# Patient Record
Sex: Female | Born: 1985
Health system: Southern US, Community
[De-identification: ages and names within clinical notes are randomized; demographics above are authoritative.]

## PROBLEM LIST (undated history)

## (undated) ENCOUNTER — Ambulatory Visit: Admission: EM | Payer: 59 | Source: Home / Self Care

## (undated) DIAGNOSIS — R0789 Other chest pain: Secondary | ICD-10-CM

## (undated) DIAGNOSIS — C801 Malignant (primary) neoplasm, unspecified: Secondary | ICD-10-CM

## (undated) DIAGNOSIS — A630 Anogenital (venereal) warts: Secondary | ICD-10-CM

## (undated) DIAGNOSIS — Z113 Encounter for screening for infections with a predominantly sexual mode of transmission: Secondary | ICD-10-CM

## (undated) DIAGNOSIS — B009 Herpesviral infection, unspecified: Secondary | ICD-10-CM

## (undated) DIAGNOSIS — D013 Carcinoma in situ of anus and anal canal: Secondary | ICD-10-CM

## (undated) DIAGNOSIS — D649 Anemia, unspecified: Secondary | ICD-10-CM

## (undated) DIAGNOSIS — Z8719 Personal history of other diseases of the digestive system: Secondary | ICD-10-CM

## (undated) DIAGNOSIS — K0530 Chronic periodontitis, unspecified: Secondary | ICD-10-CM

## (undated) DIAGNOSIS — Z8619 Personal history of other infectious and parasitic diseases: Secondary | ICD-10-CM

## (undated) DIAGNOSIS — Z8709 Personal history of other diseases of the respiratory system: Secondary | ICD-10-CM

## (undated) DIAGNOSIS — C8190 Hodgkin lymphoma, unspecified, unspecified site: Secondary | ICD-10-CM

## (undated) DIAGNOSIS — E876 Hypokalemia: Secondary | ICD-10-CM

## (undated) DIAGNOSIS — F329 Major depressive disorder, single episode, unspecified: Secondary | ICD-10-CM

## (undated) DIAGNOSIS — B20 Human immunodeficiency virus [HIV] disease: Secondary | ICD-10-CM

## (undated) DIAGNOSIS — F32A Depression, unspecified: Secondary | ICD-10-CM

## (undated) DIAGNOSIS — Z21 Asymptomatic human immunodeficiency virus [HIV] infection status: Secondary | ICD-10-CM

## (undated) HISTORY — DX: Hypokalemia: E87.6

## (undated) HISTORY — DX: Malignant (primary) neoplasm, unspecified: C80.1

## (undated) HISTORY — DX: Other chest pain: R07.89

## (undated) HISTORY — DX: Hodgkin lymphoma, unspecified, unspecified site: C81.90

## (undated) SURGERY — Surgical Case
Anesthesia: *Unknown

---
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
## 2004-07-17 ENCOUNTER — Emergency Department: Payer: Self-pay | Admitting: Internal Medicine

## 2004-07-19 ENCOUNTER — Emergency Department (HOSPITAL_COMMUNITY): Admission: EM | Admit: 2004-07-19 | Discharge: 2004-07-19 | Payer: Self-pay | Admitting: Emergency Medicine

## 2004-10-08 ENCOUNTER — Emergency Department: Payer: Self-pay | Admitting: Emergency Medicine

## 2004-10-22 ENCOUNTER — Emergency Department (HOSPITAL_COMMUNITY): Admission: EM | Admit: 2004-10-22 | Discharge: 2004-10-23 | Payer: Self-pay | Admitting: Emergency Medicine

## 2004-10-24 ENCOUNTER — Ambulatory Visit: Payer: Self-pay | Admitting: *Deleted

## 2004-12-18 ENCOUNTER — Emergency Department (HOSPITAL_COMMUNITY): Admission: EM | Admit: 2004-12-18 | Discharge: 2004-12-18 | Payer: Self-pay | Admitting: Emergency Medicine

## 2004-12-28 ENCOUNTER — Ambulatory Visit: Payer: Self-pay | Admitting: Emergency Medicine

## 2004-12-28 ENCOUNTER — Emergency Department: Payer: Self-pay | Admitting: Emergency Medicine

## 2005-02-06 ENCOUNTER — Emergency Department: Payer: Self-pay | Admitting: Emergency Medicine

## 2005-04-10 ENCOUNTER — Emergency Department: Payer: Self-pay | Admitting: Emergency Medicine

## 2005-04-19 ENCOUNTER — Emergency Department: Payer: Self-pay | Admitting: Unknown Physician Specialty

## 2005-04-20 ENCOUNTER — Emergency Department: Payer: Self-pay | Admitting: Emergency Medicine

## 2005-05-25 ENCOUNTER — Emergency Department: Payer: Self-pay | Admitting: Emergency Medicine

## 2005-06-14 ENCOUNTER — Emergency Department: Payer: Self-pay | Admitting: Emergency Medicine

## 2005-09-12 ENCOUNTER — Emergency Department: Payer: Self-pay | Admitting: Emergency Medicine

## 2005-10-28 ENCOUNTER — Emergency Department: Payer: Self-pay | Admitting: Emergency Medicine

## 2005-10-31 ENCOUNTER — Emergency Department: Payer: Self-pay | Admitting: Emergency Medicine

## 2005-10-31 ENCOUNTER — Other Ambulatory Visit: Payer: Self-pay

## 2005-11-22 ENCOUNTER — Emergency Department: Payer: Self-pay | Admitting: Emergency Medicine

## 2006-02-03 ENCOUNTER — Emergency Department (HOSPITAL_COMMUNITY): Admission: EM | Admit: 2006-02-03 | Discharge: 2006-02-04 | Payer: Self-pay | Admitting: Emergency Medicine

## 2006-03-10 ENCOUNTER — Emergency Department (HOSPITAL_COMMUNITY): Admission: EM | Admit: 2006-03-10 | Discharge: 2006-03-11 | Payer: Self-pay | Admitting: Emergency Medicine

## 2006-03-30 ENCOUNTER — Emergency Department (HOSPITAL_COMMUNITY): Admission: EM | Admit: 2006-03-30 | Discharge: 2006-03-30 | Payer: Self-pay | Admitting: Pediatrics

## 2006-04-08 ENCOUNTER — Emergency Department (HOSPITAL_COMMUNITY): Admission: EM | Admit: 2006-04-08 | Discharge: 2006-04-09 | Payer: Self-pay | Admitting: Emergency Medicine

## 2006-04-15 ENCOUNTER — Emergency Department (HOSPITAL_COMMUNITY): Admission: EM | Admit: 2006-04-15 | Discharge: 2006-04-15 | Payer: Self-pay | Admitting: Emergency Medicine

## 2006-05-06 ENCOUNTER — Emergency Department (HOSPITAL_COMMUNITY): Admission: EM | Admit: 2006-05-06 | Discharge: 2006-05-06 | Payer: Self-pay | Admitting: Family Medicine

## 2006-05-18 ENCOUNTER — Emergency Department (HOSPITAL_COMMUNITY): Admission: EM | Admit: 2006-05-18 | Discharge: 2006-05-19 | Payer: Self-pay | Admitting: Emergency Medicine

## 2006-05-19 ENCOUNTER — Encounter (INDEPENDENT_AMBULATORY_CARE_PROVIDER_SITE_OTHER): Payer: Self-pay | Admitting: *Deleted

## 2006-05-19 ENCOUNTER — Ambulatory Visit: Payer: Self-pay | Admitting: Internal Medicine

## 2006-05-19 ENCOUNTER — Encounter: Admission: RE | Admit: 2006-05-19 | Discharge: 2006-05-19 | Payer: Self-pay | Admitting: Internal Medicine

## 2006-05-19 LAB — CONVERTED CEMR LAB
Bilirubin Urine: NEGATIVE
CD4 Count: 9 microliters
CD4 T Cell Abs: 9
Chlamydia, Swab/Urine, PCR: POSITIVE — AB
GC Probe Amp, Urine: NEGATIVE
HIV 1 RNA Quant: 301000 copies/mL
HIV 1 RNA Quant: 301000 copies/mL — ABNORMAL HIGH (ref <50)
HIV-1 RNA Quant, Log: 5.48 — ABNORMAL HIGH (ref <1.70)
Hemoglobin, Urine: NEGATIVE
Ketones, ur: NEGATIVE mg/dL
Leukocytes, UA: NEGATIVE
Nitrite: NEGATIVE
Protein, ur: NEGATIVE mg/dL
Specific Gravity, Urine: 1.026 (ref 1.005–1.03)
Urine Glucose: NEGATIVE mg/dL
Urobilinogen, UA: 1 (ref 0.0–1.0)
pH: 6.5 (ref 5.0–8.0)

## 2006-05-26 ENCOUNTER — Ambulatory Visit: Payer: Self-pay | Admitting: Internal Medicine

## 2006-05-26 DIAGNOSIS — A630 Anogenital (venereal) warts: Secondary | ICD-10-CM

## 2006-05-26 DIAGNOSIS — B20 Human immunodeficiency virus [HIV] disease: Secondary | ICD-10-CM | POA: Insufficient documentation

## 2006-05-27 ENCOUNTER — Emergency Department (HOSPITAL_COMMUNITY): Admission: EM | Admit: 2006-05-27 | Discharge: 2006-05-28 | Payer: Self-pay | Admitting: Emergency Medicine

## 2006-06-03 ENCOUNTER — Emergency Department (HOSPITAL_COMMUNITY): Admission: EM | Admit: 2006-06-03 | Discharge: 2006-06-04 | Payer: Self-pay | Admitting: Emergency Medicine

## 2006-06-04 ENCOUNTER — Emergency Department (HOSPITAL_COMMUNITY): Admission: EM | Admit: 2006-06-04 | Discharge: 2006-06-04 | Payer: Self-pay | Admitting: Emergency Medicine

## 2006-06-11 ENCOUNTER — Ambulatory Visit: Payer: Self-pay | Admitting: Internal Medicine

## 2006-06-16 ENCOUNTER — Ambulatory Visit: Payer: Self-pay | Admitting: Obstetrics & Gynecology

## 2006-06-18 ENCOUNTER — Ambulatory Visit: Payer: Self-pay | Admitting: Internal Medicine

## 2006-06-21 ENCOUNTER — Encounter (INDEPENDENT_AMBULATORY_CARE_PROVIDER_SITE_OTHER): Payer: Self-pay | Admitting: *Deleted

## 2006-06-21 LAB — CONVERTED CEMR LAB
CD4 Count: 0 microliters
HIV 1 RNA Quant: 0 copies/mL

## 2006-06-24 ENCOUNTER — Encounter (INDEPENDENT_AMBULATORY_CARE_PROVIDER_SITE_OTHER): Payer: Self-pay | Admitting: *Deleted

## 2006-06-24 ENCOUNTER — Telehealth (INDEPENDENT_AMBULATORY_CARE_PROVIDER_SITE_OTHER): Payer: Self-pay | Admitting: *Deleted

## 2006-06-30 ENCOUNTER — Ambulatory Visit: Payer: Self-pay | Admitting: Obstetrics & Gynecology

## 2006-07-04 ENCOUNTER — Encounter (INDEPENDENT_AMBULATORY_CARE_PROVIDER_SITE_OTHER): Payer: Self-pay | Admitting: *Deleted

## 2006-07-05 ENCOUNTER — Telehealth (INDEPENDENT_AMBULATORY_CARE_PROVIDER_SITE_OTHER): Payer: Self-pay | Admitting: Infectious Diseases

## 2006-07-19 ENCOUNTER — Telehealth: Payer: Self-pay | Admitting: Internal Medicine

## 2006-07-26 ENCOUNTER — Emergency Department (HOSPITAL_COMMUNITY): Admission: EM | Admit: 2006-07-26 | Discharge: 2006-07-26 | Payer: Self-pay | Admitting: Emergency Medicine

## 2006-08-09 ENCOUNTER — Telehealth: Payer: Self-pay | Admitting: Internal Medicine

## 2006-08-13 ENCOUNTER — Emergency Department (HOSPITAL_COMMUNITY): Admission: EM | Admit: 2006-08-13 | Discharge: 2006-08-13 | Payer: Self-pay | Admitting: Family Medicine

## 2006-08-28 ENCOUNTER — Emergency Department (HOSPITAL_COMMUNITY): Admission: EM | Admit: 2006-08-28 | Discharge: 2006-08-29 | Payer: Self-pay | Admitting: Emergency Medicine

## 2006-09-03 ENCOUNTER — Telehealth: Payer: Self-pay | Admitting: Internal Medicine

## 2006-09-16 ENCOUNTER — Emergency Department (HOSPITAL_COMMUNITY): Admission: EM | Admit: 2006-09-16 | Discharge: 2006-09-16 | Payer: Self-pay | Admitting: Emergency Medicine

## 2006-09-17 ENCOUNTER — Inpatient Hospital Stay (HOSPITAL_COMMUNITY): Admission: EM | Admit: 2006-09-17 | Discharge: 2006-09-19 | Payer: Self-pay | Admitting: Emergency Medicine

## 2006-09-17 ENCOUNTER — Ambulatory Visit: Payer: Self-pay | Admitting: Internal Medicine

## 2006-10-06 ENCOUNTER — Emergency Department (HOSPITAL_COMMUNITY): Admission: EM | Admit: 2006-10-06 | Discharge: 2006-10-06 | Payer: Self-pay | Admitting: Family Medicine

## 2006-10-29 ENCOUNTER — Ambulatory Visit: Payer: Self-pay | Admitting: Internal Medicine

## 2006-10-29 ENCOUNTER — Inpatient Hospital Stay (HOSPITAL_COMMUNITY): Admission: EM | Admit: 2006-10-29 | Discharge: 2006-11-05 | Payer: Self-pay | Admitting: Emergency Medicine

## 2006-11-05 ENCOUNTER — Telehealth: Payer: Self-pay | Admitting: Internal Medicine

## 2006-11-08 ENCOUNTER — Telehealth: Payer: Self-pay | Admitting: Internal Medicine

## 2006-11-10 ENCOUNTER — Telehealth: Payer: Self-pay | Admitting: Internal Medicine

## 2006-11-11 ENCOUNTER — Emergency Department (HOSPITAL_COMMUNITY): Admission: EM | Admit: 2006-11-11 | Discharge: 2006-11-11 | Payer: Self-pay | Admitting: Emergency Medicine

## 2006-11-11 DIAGNOSIS — S025XXA Fracture of tooth (traumatic), initial encounter for closed fracture: Secondary | ICD-10-CM | POA: Insufficient documentation

## 2006-11-12 ENCOUNTER — Ambulatory Visit: Payer: Self-pay | Admitting: Internal Medicine

## 2006-11-12 DIAGNOSIS — K0530 Chronic periodontitis, unspecified: Secondary | ICD-10-CM

## 2006-11-12 DIAGNOSIS — B3781 Candidal esophagitis: Secondary | ICD-10-CM | POA: Insufficient documentation

## 2006-11-12 DIAGNOSIS — B009 Herpesviral infection, unspecified: Secondary | ICD-10-CM

## 2006-11-16 ENCOUNTER — Telehealth: Payer: Self-pay | Admitting: Internal Medicine

## 2006-11-16 ENCOUNTER — Encounter: Payer: Self-pay | Admitting: Internal Medicine

## 2006-11-22 ENCOUNTER — Encounter: Payer: Self-pay | Admitting: Internal Medicine

## 2006-11-25 ENCOUNTER — Emergency Department (HOSPITAL_COMMUNITY): Admission: EM | Admit: 2006-11-25 | Discharge: 2006-11-25 | Payer: Self-pay | Admitting: Emergency Medicine

## 2006-11-25 ENCOUNTER — Encounter: Payer: Self-pay | Admitting: Internal Medicine

## 2006-11-26 ENCOUNTER — Telehealth: Payer: Self-pay | Admitting: Internal Medicine

## 2006-11-29 ENCOUNTER — Telehealth: Payer: Self-pay | Admitting: Internal Medicine

## 2006-12-02 ENCOUNTER — Emergency Department (HOSPITAL_COMMUNITY): Admission: EM | Admit: 2006-12-02 | Discharge: 2006-12-02 | Payer: Self-pay | Admitting: Emergency Medicine

## 2006-12-13 ENCOUNTER — Encounter: Admission: RE | Admit: 2006-12-13 | Discharge: 2006-12-13 | Payer: Self-pay | Admitting: Internal Medicine

## 2006-12-13 ENCOUNTER — Ambulatory Visit: Payer: Self-pay | Admitting: Internal Medicine

## 2006-12-13 LAB — CONVERTED CEMR LAB
ALT: 9 units/L (ref 0–35)
AST: 16 units/L (ref 0–37)
Albumin: 3.9 g/dL (ref 3.5–5.2)
Alkaline Phosphatase: 45 units/L (ref 39–117)
BUN: 7 mg/dL (ref 6–23)
Basophils Absolute: 0 10*3/uL (ref 0.0–0.1)
Basophils Relative: 1 % (ref 0–1)
CO2: 24 meq/L (ref 19–32)
Calcium: 9.7 mg/dL (ref 8.4–10.5)
Chloride: 103 meq/L (ref 96–112)
Creatinine, Ser: 0.82 mg/dL (ref 0.40–1.20)
Eosinophils Absolute: 0.4 10*3/uL (ref 0.0–0.7)
Eosinophils Relative: 10 % — ABNORMAL HIGH (ref 0–5)
Glucose, Bld: 79 mg/dL (ref 70–99)
HCT: 33.5 % — ABNORMAL LOW (ref 36.0–46.0)
HIV 1 RNA Quant: 277 copies/mL — ABNORMAL HIGH (ref <50)
HIV-1 RNA Quant, Log: 2.44 — ABNORMAL HIGH (ref <1.70)
Hemoglobin: 10.7 g/dL — ABNORMAL LOW (ref 12.0–15.0)
Lymphocytes Relative: 19 % (ref 12–46)
Lymphs Abs: 0.8 10*3/uL (ref 0.7–3.3)
MCHC: 31.9 g/dL (ref 30.0–36.0)
MCV: 98.5 fL (ref 78.0–100.0)
Monocytes Absolute: 0.4 10*3/uL (ref 0.2–0.7)
Monocytes Relative: 9 % (ref 3–11)
Neutro Abs: 2.5 10*3/uL (ref 1.7–7.7)
Neutrophils Relative %: 61 % (ref 43–77)
Platelets: 312 10*3/uL (ref 150–400)
Potassium: 5.3 meq/L (ref 3.5–5.3)
RBC: 3.4 M/uL — ABNORMAL LOW (ref 3.87–5.11)
RDW: 26 % — ABNORMAL HIGH (ref 11.5–14.0)
Sodium: 137 meq/L (ref 135–145)
Total Bilirubin: 0.3 mg/dL (ref 0.3–1.2)
Total Protein: 8.3 g/dL (ref 6.0–8.3)
WBC: 4.2 10*3/uL (ref 4.0–10.5)

## 2006-12-14 ENCOUNTER — Telehealth: Payer: Self-pay | Admitting: Internal Medicine

## 2006-12-28 ENCOUNTER — Telehealth: Payer: Self-pay | Admitting: Internal Medicine

## 2006-12-29 ENCOUNTER — Telehealth: Payer: Self-pay | Admitting: Internal Medicine

## 2006-12-31 ENCOUNTER — Ambulatory Visit: Payer: Self-pay | Admitting: Internal Medicine

## 2006-12-31 DIAGNOSIS — R634 Abnormal weight loss: Secondary | ICD-10-CM | POA: Insufficient documentation

## 2006-12-31 DIAGNOSIS — L259 Unspecified contact dermatitis, unspecified cause: Secondary | ICD-10-CM

## 2007-01-03 ENCOUNTER — Telehealth: Payer: Self-pay | Admitting: Internal Medicine

## 2007-01-06 ENCOUNTER — Encounter: Payer: Self-pay | Admitting: Licensed Clinical Social Worker

## 2007-01-11 ENCOUNTER — Encounter: Payer: Self-pay | Admitting: Licensed Clinical Social Worker

## 2007-01-12 ENCOUNTER — Telehealth: Payer: Self-pay | Admitting: Internal Medicine

## 2007-01-18 ENCOUNTER — Telehealth: Payer: Self-pay | Admitting: Internal Medicine

## 2007-01-18 ENCOUNTER — Emergency Department (HOSPITAL_COMMUNITY): Admission: EM | Admit: 2007-01-18 | Discharge: 2007-01-18 | Payer: Self-pay | Admitting: Emergency Medicine

## 2007-02-07 ENCOUNTER — Telehealth: Payer: Self-pay | Admitting: Internal Medicine

## 2007-02-22 ENCOUNTER — Telehealth: Payer: Self-pay | Admitting: Internal Medicine

## 2007-03-01 ENCOUNTER — Telehealth: Payer: Self-pay | Admitting: Internal Medicine

## 2007-03-09 ENCOUNTER — Telehealth: Payer: Self-pay | Admitting: Internal Medicine

## 2007-03-31 ENCOUNTER — Ambulatory Visit: Payer: Self-pay | Admitting: Internal Medicine

## 2007-03-31 ENCOUNTER — Encounter: Admission: RE | Admit: 2007-03-31 | Discharge: 2007-03-31 | Payer: Self-pay | Admitting: Internal Medicine

## 2007-03-31 LAB — CONVERTED CEMR LAB
ALT: 21 units/L (ref 0–35)
AST: 19 units/L (ref 0–37)
Albumin: 4.1 g/dL (ref 3.5–5.2)
Alkaline Phosphatase: 55 units/L (ref 39–117)
BUN: 7 mg/dL (ref 6–23)
Basophils Absolute: 0 10*3/uL (ref 0.0–0.1)
Basophils Relative: 1 % (ref 0–1)
CO2: 22 meq/L (ref 19–32)
Calcium: 9.1 mg/dL (ref 8.4–10.5)
Chloride: 104 meq/L (ref 96–112)
Creatinine, Ser: 0.88 mg/dL (ref 0.40–1.20)
Eosinophils Absolute: 0.1 10*3/uL — ABNORMAL LOW (ref 0.2–0.7)
Eosinophils Relative: 4 % (ref 0–5)
Glucose, Bld: 84 mg/dL (ref 70–99)
HCT: 38.4 % (ref 36.0–46.0)
HIV 1 RNA Quant: 14900 copies/mL — ABNORMAL HIGH (ref <50)
HIV-1 RNA Quant, Log: 4.17 — ABNORMAL HIGH (ref <1.70)
Hemoglobin: 13.2 g/dL (ref 12.0–15.0)
Lymphocytes Relative: 54 % — ABNORMAL HIGH (ref 12–46)
Lymphs Abs: 1.6 10*3/uL (ref 0.7–4.0)
MCHC: 34.4 g/dL (ref 30.0–36.0)
MCV: 99.5 fL (ref 78.0–100.0)
Monocytes Absolute: 0.2 10*3/uL (ref 0.1–1.0)
Monocytes Relative: 8 % (ref 3–12)
Neutro Abs: 1 10*3/uL — ABNORMAL LOW (ref 1.7–7.7)
Neutrophils Relative %: 34 % — ABNORMAL LOW (ref 43–77)
Platelets: 267 10*3/uL (ref 150–400)
Potassium: 3.7 meq/L (ref 3.5–5.3)
RBC: 3.86 M/uL — ABNORMAL LOW (ref 3.87–5.11)
RDW: 13.4 % (ref 11.5–15.5)
Sodium: 136 meq/L (ref 135–145)
Total Bilirubin: 0.8 mg/dL (ref 0.3–1.2)
Total Protein: 8.6 g/dL — ABNORMAL HIGH (ref 6.0–8.3)
WBC: 2.9 10*3/uL — ABNORMAL LOW (ref 4.0–10.5)

## 2007-04-07 ENCOUNTER — Telehealth: Payer: Self-pay | Admitting: Internal Medicine

## 2007-04-27 ENCOUNTER — Encounter (INDEPENDENT_AMBULATORY_CARE_PROVIDER_SITE_OTHER): Payer: Self-pay | Admitting: *Deleted

## 2007-05-13 ENCOUNTER — Ambulatory Visit: Payer: Self-pay | Admitting: Internal Medicine

## 2007-05-13 DIAGNOSIS — B370 Candidal stomatitis: Secondary | ICD-10-CM

## 2007-05-13 DIAGNOSIS — N926 Irregular menstruation, unspecified: Secondary | ICD-10-CM | POA: Insufficient documentation

## 2007-05-13 LAB — CONVERTED CEMR LAB: Beta hcg, urine, semiquantitative: NEGATIVE

## 2007-05-17 ENCOUNTER — Telehealth: Payer: Self-pay | Admitting: Internal Medicine

## 2007-05-18 ENCOUNTER — Telehealth: Payer: Self-pay | Admitting: Internal Medicine

## 2007-05-23 ENCOUNTER — Telehealth: Payer: Self-pay | Admitting: Internal Medicine

## 2007-05-25 ENCOUNTER — Telehealth: Payer: Self-pay | Admitting: Internal Medicine

## 2007-05-30 ENCOUNTER — Telehealth: Payer: Self-pay | Admitting: Internal Medicine

## 2007-06-03 ENCOUNTER — Encounter (INDEPENDENT_AMBULATORY_CARE_PROVIDER_SITE_OTHER): Payer: Self-pay | Admitting: *Deleted

## 2007-06-16 ENCOUNTER — Telehealth: Payer: Self-pay | Admitting: Internal Medicine

## 2007-06-29 ENCOUNTER — Encounter (INDEPENDENT_AMBULATORY_CARE_PROVIDER_SITE_OTHER): Payer: Self-pay | Admitting: *Deleted

## 2007-07-12 ENCOUNTER — Encounter: Admission: RE | Admit: 2007-07-12 | Discharge: 2007-07-12 | Payer: Self-pay | Admitting: Internal Medicine

## 2007-07-12 ENCOUNTER — Ambulatory Visit: Payer: Self-pay | Admitting: Internal Medicine

## 2007-07-12 ENCOUNTER — Telehealth: Payer: Self-pay | Admitting: Internal Medicine

## 2007-07-12 ENCOUNTER — Telehealth (INDEPENDENT_AMBULATORY_CARE_PROVIDER_SITE_OTHER): Payer: Self-pay | Admitting: *Deleted

## 2007-07-12 LAB — CONVERTED CEMR LAB
ALT: 23 units/L (ref 0–35)
AST: 22 units/L (ref 0–37)
Albumin: 4 g/dL (ref 3.5–5.2)
Alkaline Phosphatase: 54 units/L (ref 39–117)
BUN: 7 mg/dL (ref 6–23)
Basophils Absolute: 0 10*3/uL (ref 0.0–0.1)
Basophils Relative: 0 % (ref 0–1)
CO2: 24 meq/L (ref 19–32)
Calcium: 8.8 mg/dL (ref 8.4–10.5)
Chloride: 102 meq/L (ref 96–112)
Creatinine, Ser: 0.63 mg/dL (ref 0.40–1.20)
Eosinophils Absolute: 0.1 10*3/uL (ref 0.0–0.7)
Eosinophils Relative: 3 % (ref 0–5)
Glucose, Bld: 85 mg/dL (ref 70–99)
HCT: 36.7 % (ref 36.0–46.0)
HIV 1 RNA Quant: 909000 copies/mL — ABNORMAL HIGH (ref <50)
HIV-1 RNA Quant, Log: 5.96 — ABNORMAL HIGH (ref <1.70)
Hemoglobin: 12.2 g/dL (ref 12.0–15.0)
Lymphocytes Relative: 17 % (ref 12–46)
Lymphs Abs: 0.7 10*3/uL (ref 0.7–4.0)
MCHC: 33.2 g/dL (ref 30.0–36.0)
MCV: 90.8 fL (ref 78.0–100.0)
Monocytes Absolute: 0.4 10*3/uL (ref 0.1–1.0)
Monocytes Relative: 10 % (ref 3–12)
Neutro Abs: 2.8 10*3/uL (ref 1.7–7.7)
Neutrophils Relative %: 70 % (ref 43–77)
Platelets: 265 10*3/uL (ref 150–400)
Potassium: 3.6 meq/L (ref 3.5–5.3)
RBC: 4.04 M/uL (ref 3.87–5.11)
RDW: 14.5 % (ref 11.5–15.5)
Sodium: 137 meq/L (ref 135–145)
Total Bilirubin: 0.3 mg/dL (ref 0.3–1.2)
Total Protein: 8 g/dL (ref 6.0–8.3)
WBC: 4 10*3/uL (ref 4.0–10.5)

## 2007-07-22 ENCOUNTER — Ambulatory Visit: Payer: Self-pay | Admitting: Internal Medicine

## 2007-07-22 DIAGNOSIS — R21 Rash and other nonspecific skin eruption: Secondary | ICD-10-CM | POA: Insufficient documentation

## 2007-07-22 LAB — CONVERTED CEMR LAB
HIV 1 RNA Quant: 194000 copies/mL — ABNORMAL HIGH (ref <50)
HIV-1 RNA Quant, Log: 5.29 — ABNORMAL HIGH (ref <1.70)

## 2007-07-26 ENCOUNTER — Ambulatory Visit: Payer: Self-pay | Admitting: Internal Medicine

## 2007-08-08 ENCOUNTER — Telehealth: Payer: Self-pay | Admitting: Internal Medicine

## 2007-08-10 ENCOUNTER — Emergency Department (HOSPITAL_COMMUNITY): Admission: EM | Admit: 2007-08-10 | Discharge: 2007-08-10 | Payer: Self-pay | Admitting: Emergency Medicine

## 2007-08-12 ENCOUNTER — Telehealth (INDEPENDENT_AMBULATORY_CARE_PROVIDER_SITE_OTHER): Payer: Self-pay | Admitting: *Deleted

## 2007-08-15 ENCOUNTER — Emergency Department (HOSPITAL_COMMUNITY): Admission: EM | Admit: 2007-08-15 | Discharge: 2007-08-15 | Payer: Self-pay | Admitting: Emergency Medicine

## 2007-08-17 ENCOUNTER — Ambulatory Visit: Payer: Self-pay | Admitting: Internal Medicine

## 2007-08-18 ENCOUNTER — Encounter (INDEPENDENT_AMBULATORY_CARE_PROVIDER_SITE_OTHER): Payer: Self-pay | Admitting: *Deleted

## 2007-08-19 DIAGNOSIS — T7411XA Adult physical abuse, confirmed, initial encounter: Secondary | ICD-10-CM

## 2007-08-23 ENCOUNTER — Encounter: Payer: Self-pay | Admitting: Internal Medicine

## 2007-09-05 ENCOUNTER — Emergency Department (HOSPITAL_COMMUNITY): Admission: EM | Admit: 2007-09-05 | Discharge: 2007-09-05 | Payer: Self-pay | Admitting: Family Medicine

## 2007-09-05 ENCOUNTER — Encounter (INDEPENDENT_AMBULATORY_CARE_PROVIDER_SITE_OTHER): Payer: Self-pay | Admitting: Internal Medicine

## 2007-09-05 DIAGNOSIS — R51 Headache: Secondary | ICD-10-CM

## 2007-09-05 DIAGNOSIS — R519 Headache, unspecified: Secondary | ICD-10-CM | POA: Insufficient documentation

## 2007-09-12 ENCOUNTER — Telehealth (INDEPENDENT_AMBULATORY_CARE_PROVIDER_SITE_OTHER): Payer: Self-pay | Admitting: *Deleted

## 2007-09-28 ENCOUNTER — Telehealth: Payer: Self-pay

## 2007-10-10 ENCOUNTER — Telehealth (INDEPENDENT_AMBULATORY_CARE_PROVIDER_SITE_OTHER): Payer: Self-pay | Admitting: *Deleted

## 2007-11-08 ENCOUNTER — Telehealth (INDEPENDENT_AMBULATORY_CARE_PROVIDER_SITE_OTHER): Payer: Self-pay | Admitting: *Deleted

## 2007-12-07 ENCOUNTER — Telehealth (INDEPENDENT_AMBULATORY_CARE_PROVIDER_SITE_OTHER): Payer: Self-pay | Admitting: *Deleted

## 2007-12-12 ENCOUNTER — Emergency Department (HOSPITAL_COMMUNITY): Admission: EM | Admit: 2007-12-12 | Discharge: 2007-12-12 | Payer: Self-pay | Admitting: Emergency Medicine

## 2007-12-13 ENCOUNTER — Telehealth: Payer: Self-pay | Admitting: Internal Medicine

## 2007-12-14 ENCOUNTER — Telehealth (INDEPENDENT_AMBULATORY_CARE_PROVIDER_SITE_OTHER): Payer: Self-pay | Admitting: *Deleted

## 2008-01-09 ENCOUNTER — Telehealth (INDEPENDENT_AMBULATORY_CARE_PROVIDER_SITE_OTHER): Payer: Self-pay | Admitting: *Deleted

## 2008-01-11 ENCOUNTER — Ambulatory Visit: Payer: Self-pay | Admitting: Internal Medicine

## 2008-01-11 LAB — CONVERTED CEMR LAB
ALT: 23 units/L (ref 0–35)
AST: 19 units/L (ref 0–37)
Alkaline Phosphatase: 59 units/L (ref 39–117)
Basophils Absolute: 0 10*3/uL (ref 0.0–0.1)
Basophils Relative: 1 % (ref 0–1)
Creatinine, Ser: 0.62 mg/dL (ref 0.40–1.20)
Eosinophils Relative: 4 % (ref 0–5)
HCT: 39.3 % (ref 36.0–46.0)
Hemoglobin: 12.9 g/dL (ref 12.0–15.0)
MCHC: 32.8 g/dL (ref 30.0–36.0)
Monocytes Absolute: 0.5 10*3/uL (ref 0.1–1.0)
Platelets: 265 10*3/uL (ref 150–400)
RDW: 13.7 % (ref 11.5–15.5)
Sodium: 142 meq/L (ref 135–145)
Total Bilirubin: 0.4 mg/dL (ref 0.3–1.2)

## 2008-02-03 ENCOUNTER — Telehealth (INDEPENDENT_AMBULATORY_CARE_PROVIDER_SITE_OTHER): Payer: Self-pay | Admitting: *Deleted

## 2008-03-06 ENCOUNTER — Encounter: Payer: Self-pay | Admitting: Internal Medicine

## 2008-03-08 ENCOUNTER — Telehealth (INDEPENDENT_AMBULATORY_CARE_PROVIDER_SITE_OTHER): Payer: Self-pay | Admitting: *Deleted

## 2008-03-20 ENCOUNTER — Ambulatory Visit: Payer: Self-pay | Admitting: Internal Medicine

## 2008-03-20 DIAGNOSIS — F329 Major depressive disorder, single episode, unspecified: Secondary | ICD-10-CM | POA: Insufficient documentation

## 2008-03-20 DIAGNOSIS — F32A Depression, unspecified: Secondary | ICD-10-CM | POA: Insufficient documentation

## 2008-04-03 ENCOUNTER — Telehealth (INDEPENDENT_AMBULATORY_CARE_PROVIDER_SITE_OTHER): Payer: Self-pay | Admitting: *Deleted

## 2008-04-12 ENCOUNTER — Emergency Department (HOSPITAL_COMMUNITY): Admission: EM | Admit: 2008-04-12 | Discharge: 2008-04-12 | Payer: Self-pay | Admitting: Emergency Medicine

## 2008-05-03 ENCOUNTER — Telehealth (INDEPENDENT_AMBULATORY_CARE_PROVIDER_SITE_OTHER): Payer: Self-pay | Admitting: *Deleted

## 2008-06-01 ENCOUNTER — Telehealth (INDEPENDENT_AMBULATORY_CARE_PROVIDER_SITE_OTHER): Payer: Self-pay | Admitting: *Deleted

## 2008-06-13 ENCOUNTER — Encounter: Payer: Self-pay | Admitting: Internal Medicine

## 2008-06-13 ENCOUNTER — Ambulatory Visit: Payer: Self-pay | Admitting: Internal Medicine

## 2008-06-13 DIAGNOSIS — N63 Unspecified lump in unspecified breast: Secondary | ICD-10-CM

## 2008-06-13 LAB — CONVERTED CEMR LAB
AST: 17 units/L (ref 0–37)
BUN: 13 mg/dL (ref 6–23)
Basophils Relative: 0 % (ref 0–1)
Calcium: 9.9 mg/dL (ref 8.4–10.5)
Chloride: 104 meq/L (ref 96–112)
Creatinine, Ser: 0.75 mg/dL (ref 0.40–1.20)
Eosinophils Absolute: 0.1 10*3/uL (ref 0.0–0.7)
Eosinophils Relative: 2 % (ref 0–5)
HCT: 40.2 % (ref 36.0–46.0)
HIV 1 RNA Quant: 33800 copies/mL — ABNORMAL HIGH (ref <48)
HIV-1 RNA Quant, Log: 4.53 — ABNORMAL HIGH (ref <1.68)
Hemoglobin: 14 g/dL (ref 12.0–15.0)
MCHC: 34.8 g/dL (ref 30.0–36.0)
MCV: 89.5 fL (ref 78.0–100.0)
Monocytes Absolute: 0.5 10*3/uL (ref 0.1–1.0)
Monocytes Relative: 8 % (ref 3–12)
Neutro Abs: 3 10*3/uL (ref 1.7–7.7)
RBC: 4.49 M/uL (ref 3.87–5.11)

## 2008-06-15 ENCOUNTER — Encounter: Admission: RE | Admit: 2008-06-15 | Discharge: 2008-06-15 | Payer: Self-pay | Admitting: Internal Medicine

## 2008-07-02 ENCOUNTER — Telehealth (INDEPENDENT_AMBULATORY_CARE_PROVIDER_SITE_OTHER): Payer: Self-pay | Admitting: *Deleted

## 2008-09-30 ENCOUNTER — Emergency Department (HOSPITAL_COMMUNITY): Admission: EM | Admit: 2008-09-30 | Discharge: 2008-09-30 | Payer: Self-pay | Admitting: Emergency Medicine

## 2008-10-02 ENCOUNTER — Ambulatory Visit: Payer: Self-pay | Admitting: Internal Medicine

## 2008-10-20 ENCOUNTER — Emergency Department (HOSPITAL_COMMUNITY): Admission: EM | Admit: 2008-10-20 | Discharge: 2008-10-20 | Payer: Self-pay | Admitting: Family Medicine

## 2008-11-15 ENCOUNTER — Ambulatory Visit: Payer: Self-pay | Admitting: Internal Medicine

## 2008-11-15 ENCOUNTER — Inpatient Hospital Stay (HOSPITAL_COMMUNITY): Admission: AD | Admit: 2008-11-15 | Discharge: 2008-11-15 | Payer: Self-pay | Admitting: Obstetrics & Gynecology

## 2008-11-15 LAB — CONVERTED CEMR LAB
Alkaline Phosphatase: 39 units/L (ref 39–117)
BUN: 9 mg/dL (ref 6–23)
Basophils Relative: 0 % (ref 0–1)
Eosinophils Absolute: 0.1 10*3/uL (ref 0.0–0.7)
Glucose, Bld: 86 mg/dL (ref 70–99)
HCT: 38.2 % (ref 36.0–46.0)
Hemoglobin: 12.7 g/dL (ref 12.0–15.0)
MCHC: 33.2 g/dL (ref 30.0–36.0)
MCV: 91.6 fL (ref 78.0–100.0)
Monocytes Absolute: 0.5 10*3/uL (ref 0.1–1.0)
Monocytes Relative: 14 % — ABNORMAL HIGH (ref 3–12)
RBC: 4.17 M/uL (ref 3.87–5.11)
Sodium: 136 meq/L (ref 135–145)
Total Bilirubin: 0.3 mg/dL (ref 0.3–1.2)

## 2008-11-26 ENCOUNTER — Encounter: Payer: Self-pay | Admitting: Internal Medicine

## 2008-12-05 ENCOUNTER — Ambulatory Visit: Payer: Self-pay | Admitting: Internal Medicine

## 2008-12-25 ENCOUNTER — Emergency Department (HOSPITAL_COMMUNITY): Admission: EM | Admit: 2008-12-25 | Discharge: 2008-12-25 | Payer: Self-pay | Admitting: Family Medicine

## 2009-01-08 ENCOUNTER — Telehealth (INDEPENDENT_AMBULATORY_CARE_PROVIDER_SITE_OTHER): Payer: Self-pay | Admitting: *Deleted

## 2009-01-21 ENCOUNTER — Encounter (INDEPENDENT_AMBULATORY_CARE_PROVIDER_SITE_OTHER): Payer: Self-pay | Admitting: *Deleted

## 2009-01-30 ENCOUNTER — Telehealth (INDEPENDENT_AMBULATORY_CARE_PROVIDER_SITE_OTHER): Payer: Self-pay | Admitting: *Deleted

## 2009-01-31 ENCOUNTER — Telehealth (INDEPENDENT_AMBULATORY_CARE_PROVIDER_SITE_OTHER): Payer: Self-pay | Admitting: *Deleted

## 2009-02-11 ENCOUNTER — Encounter: Payer: Self-pay | Admitting: Internal Medicine

## 2009-02-18 ENCOUNTER — Ambulatory Visit: Payer: Self-pay | Admitting: Internal Medicine

## 2009-02-18 LAB — CONVERTED CEMR LAB
ALT: 18 units/L (ref 0–35)
Albumin: 4.1 g/dL (ref 3.5–5.2)
CO2: 25 meq/L (ref 19–32)
Calcium: 9.1 mg/dL (ref 8.4–10.5)
Chloride: 106 meq/L (ref 96–112)
Creatinine, Ser: 0.72 mg/dL (ref 0.40–1.20)
Eosinophils Absolute: 0 10*3/uL (ref 0.0–0.7)
Eosinophils Relative: 1 % (ref 0–5)
HCT: 40.3 % (ref 36.0–46.0)
HIV 1 RNA Quant: 196000 copies/mL — ABNORMAL HIGH (ref <48)
Lymphocytes Relative: 19 % (ref 12–46)
Lymphs Abs: 0.7 10*3/uL (ref 0.7–4.0)
MCV: 90.8 fL (ref >=78.0)
Monocytes Relative: 11 % (ref 3–12)
Neutrophils Relative %: 69 % (ref 43–77)
Potassium: 4.5 meq/L (ref 3.5–5.3)
RBC: 4.44 M/uL (ref 3.87–5.11)
WBC: 3.8 10*3/uL — ABNORMAL LOW (ref 4.0–10.5)

## 2009-02-28 ENCOUNTER — Telehealth (INDEPENDENT_AMBULATORY_CARE_PROVIDER_SITE_OTHER): Payer: Self-pay | Admitting: *Deleted

## 2009-03-06 ENCOUNTER — Ambulatory Visit: Payer: Self-pay | Admitting: Internal Medicine

## 2009-03-18 ENCOUNTER — Encounter: Payer: Self-pay | Admitting: Internal Medicine

## 2009-03-28 ENCOUNTER — Telehealth (INDEPENDENT_AMBULATORY_CARE_PROVIDER_SITE_OTHER): Payer: Self-pay | Admitting: *Deleted

## 2009-04-01 ENCOUNTER — Telehealth (INDEPENDENT_AMBULATORY_CARE_PROVIDER_SITE_OTHER): Payer: Self-pay | Admitting: *Deleted

## 2009-04-08 ENCOUNTER — Ambulatory Visit: Payer: Self-pay | Admitting: Internal Medicine

## 2009-04-08 LAB — CONVERTED CEMR LAB

## 2009-04-09 ENCOUNTER — Telehealth (INDEPENDENT_AMBULATORY_CARE_PROVIDER_SITE_OTHER): Payer: Self-pay | Admitting: *Deleted

## 2009-04-09 ENCOUNTER — Encounter: Payer: Self-pay | Admitting: Internal Medicine

## 2009-04-09 LAB — CONVERTED CEMR LAB
ALT: 22 units/L (ref 0–35)
Alkaline Phosphatase: 52 units/L (ref 39–117)
Basophils Absolute: 0 10*3/uL (ref 0.0–0.1)
Basophils Relative: 0 % (ref 0–1)
Creatinine, Ser: 0.75 mg/dL (ref 0.40–1.20)
Eosinophils Absolute: 0.1 10*3/uL (ref 0.0–0.7)
MCHC: 32.7 g/dL (ref 30.0–36.0)
MCV: 95.8 fL (ref >=78.0)
Monocytes Relative: 11 % (ref 3–12)
Neutro Abs: 2.4 10*3/uL (ref 1.7–7.7)
Neutrophils Relative %: 53 % (ref 43–77)
Platelets: 290 10*3/uL (ref 150–400)
RBC: 4.72 M/uL (ref 3.87–5.11)
RDW: 15.4 % (ref 11.5–15.5)
Sodium: 137 meq/L (ref 135–145)
Total Bilirubin: 0.3 mg/dL (ref 0.3–1.2)
Total Protein: 8.1 g/dL (ref 6.0–8.3)

## 2009-05-01 ENCOUNTER — Telehealth (INDEPENDENT_AMBULATORY_CARE_PROVIDER_SITE_OTHER): Payer: Self-pay | Admitting: *Deleted

## 2009-05-01 ENCOUNTER — Ambulatory Visit: Payer: Self-pay | Admitting: Internal Medicine

## 2009-05-13 ENCOUNTER — Encounter (INDEPENDENT_AMBULATORY_CARE_PROVIDER_SITE_OTHER): Payer: Self-pay | Admitting: *Deleted

## 2009-05-28 ENCOUNTER — Telehealth (INDEPENDENT_AMBULATORY_CARE_PROVIDER_SITE_OTHER): Payer: Self-pay | Admitting: *Deleted

## 2009-06-07 ENCOUNTER — Encounter: Payer: Self-pay | Admitting: Internal Medicine

## 2009-07-01 ENCOUNTER — Telehealth (INDEPENDENT_AMBULATORY_CARE_PROVIDER_SITE_OTHER): Payer: Self-pay | Admitting: *Deleted

## 2009-07-30 ENCOUNTER — Encounter: Payer: Self-pay | Admitting: Internal Medicine

## 2009-07-31 ENCOUNTER — Telehealth (INDEPENDENT_AMBULATORY_CARE_PROVIDER_SITE_OTHER): Payer: Self-pay | Admitting: *Deleted

## 2009-08-05 ENCOUNTER — Encounter (INDEPENDENT_AMBULATORY_CARE_PROVIDER_SITE_OTHER): Payer: Self-pay | Admitting: *Deleted

## 2009-08-21 ENCOUNTER — Telehealth: Payer: Self-pay | Admitting: Internal Medicine

## 2009-09-16 ENCOUNTER — Ambulatory Visit: Payer: Self-pay | Admitting: Internal Medicine

## 2009-09-16 LAB — CONVERTED CEMR LAB
ALT: 36 units/L — ABNORMAL HIGH (ref 0–35)
Basophils Relative: 1 % (ref 0–1)
CO2: 26 meq/L (ref 19–32)
Calcium: 8.5 mg/dL (ref 8.4–10.5)
Chloride: 102 meq/L (ref 96–112)
Eosinophils Absolute: 0 10*3/uL (ref 0.0–0.7)
Glucose, Bld: 85 mg/dL (ref 70–99)
HIV 1 RNA Quant: 314000 copies/mL — ABNORMAL HIGH (ref <48)
Hemoglobin: 13 g/dL (ref 12.0–15.0)
Hep B S Ab: NEGATIVE
Lymphs Abs: 0.7 10*3/uL (ref 0.7–4.0)
MCHC: 34 g/dL (ref 30.0–36.0)
MCV: 87.2 fL (ref 78.0–100.0)
Monocytes Absolute: 0.2 10*3/uL (ref 0.1–1.0)
Monocytes Relative: 9 % (ref 3–12)
Neutro Abs: 1.2 10*3/uL — ABNORMAL LOW (ref 1.7–7.7)
Neutrophils Relative %: 57 % (ref 43–77)
RBC: 4.38 M/uL (ref 3.87–5.11)
Sodium: 138 meq/L (ref 135–145)
Total Bilirubin: 0.4 mg/dL (ref 0.3–1.2)
Total Protein: 8.2 g/dL (ref 6.0–8.3)
WBC: 2.2 10*3/uL — ABNORMAL LOW (ref 4.0–10.5)

## 2009-09-18 ENCOUNTER — Telehealth (INDEPENDENT_AMBULATORY_CARE_PROVIDER_SITE_OTHER): Payer: Self-pay | Admitting: *Deleted

## 2009-10-03 ENCOUNTER — Ambulatory Visit: Payer: Self-pay | Admitting: Internal Medicine

## 2009-10-18 ENCOUNTER — Emergency Department (HOSPITAL_COMMUNITY): Admission: EM | Admit: 2009-10-18 | Discharge: 2009-10-18 | Payer: Self-pay | Admitting: Family Medicine

## 2009-10-18 ENCOUNTER — Telehealth (INDEPENDENT_AMBULATORY_CARE_PROVIDER_SITE_OTHER): Payer: Self-pay | Admitting: *Deleted

## 2009-10-29 ENCOUNTER — Telehealth: Payer: Self-pay | Admitting: Internal Medicine

## 2009-11-18 ENCOUNTER — Ambulatory Visit: Payer: Self-pay | Admitting: Internal Medicine

## 2009-11-18 LAB — CONVERTED CEMR LAB
ALT: 26 units/L (ref 0–35)
AST: 24 units/L (ref 0–37)
Albumin: 4 g/dL (ref 3.5–5.2)
Alkaline Phosphatase: 58 units/L (ref 39–117)
BUN: 8 mg/dL (ref 6–23)
Basophils Absolute: 0 10*3/uL (ref 0.0–0.1)
Basophils Relative: 0 % (ref 0–1)
Chloride: 105 meq/L (ref 96–112)
Eosinophils Absolute: 0.1 10*3/uL (ref 0.0–0.7)
HIV-1 RNA Quant, Log: 4.48 — ABNORMAL HIGH (ref <1.68)
MCHC: 34.3 g/dL (ref 30.0–36.0)
MCV: 94.5 fL (ref 78.0–100.0)
Neutro Abs: 1.8 10*3/uL (ref 1.7–7.7)
Neutrophils Relative %: 53 % (ref 43–77)
Platelets: 239 10*3/uL (ref 150–400)
Potassium: 4 meq/L (ref 3.5–5.3)
RDW: 16.4 % — ABNORMAL HIGH (ref 11.5–15.5)
Sodium: 137 meq/L (ref 135–145)
Total Protein: 8 g/dL (ref 6.0–8.3)

## 2009-11-25 ENCOUNTER — Telehealth: Payer: Self-pay | Admitting: Internal Medicine

## 2009-11-26 ENCOUNTER — Telehealth: Payer: Self-pay | Admitting: Internal Medicine

## 2009-12-24 ENCOUNTER — Telehealth: Payer: Self-pay | Admitting: Internal Medicine

## 2010-01-20 ENCOUNTER — Telehealth: Payer: Self-pay | Admitting: Internal Medicine

## 2010-02-03 ENCOUNTER — Encounter: Payer: Self-pay | Admitting: Internal Medicine

## 2010-02-04 ENCOUNTER — Ambulatory Visit: Payer: Self-pay | Admitting: Internal Medicine

## 2010-02-04 LAB — CONVERTED CEMR LAB
HIV 1 RNA Quant: 62800 copies/mL — ABNORMAL HIGH (ref <20)
HIV-1 RNA Quant, Log: 4.8 — ABNORMAL HIGH (ref <1.30)

## 2010-02-05 ENCOUNTER — Encounter: Payer: Self-pay | Admitting: Internal Medicine

## 2010-02-07 LAB — CONVERTED CEMR LAB
Basophils Absolute: 0 10*3/uL (ref 0.0–0.1)
CO2: 24 meq/L (ref 19–32)
Creatinine, Ser: 0.71 mg/dL (ref 0.40–1.20)
Eosinophils Absolute: 0.1 10*3/uL (ref 0.0–0.7)
Eosinophils Relative: 1 % (ref 0–5)
Glucose, Bld: 68 mg/dL — ABNORMAL LOW (ref 70–99)
HCT: 36.8 % (ref 36.0–46.0)
Hemoglobin: 12.7 g/dL (ref 12.0–15.0)
Lymphocytes Relative: 24 % (ref 12–46)
Lymphs Abs: 0.9 10*3/uL (ref 0.7–4.0)
MCV: 91.8 fL (ref 78.0–100.0)
Monocytes Absolute: 0.3 10*3/uL (ref 0.1–1.0)
RDW: 13.1 % (ref 11.5–15.5)
Total Bilirubin: 0.2 mg/dL — ABNORMAL LOW (ref 0.3–1.2)

## 2010-03-07 ENCOUNTER — Encounter: Payer: Self-pay | Admitting: Internal Medicine

## 2010-03-14 ENCOUNTER — Ambulatory Visit: Payer: Self-pay | Admitting: Internal Medicine

## 2010-03-18 ENCOUNTER — Encounter (INDEPENDENT_AMBULATORY_CARE_PROVIDER_SITE_OTHER): Payer: Self-pay | Admitting: *Deleted

## 2010-03-19 ENCOUNTER — Telehealth (INDEPENDENT_AMBULATORY_CARE_PROVIDER_SITE_OTHER): Payer: Self-pay | Admitting: *Deleted

## 2010-04-05 ENCOUNTER — Emergency Department (HOSPITAL_COMMUNITY)
Admission: EM | Admit: 2010-04-05 | Discharge: 2010-04-06 | Payer: Self-pay | Source: Home / Self Care | Admitting: Emergency Medicine

## 2010-04-14 ENCOUNTER — Telehealth: Payer: Self-pay | Admitting: *Deleted

## 2010-04-29 ENCOUNTER — Encounter: Payer: Self-pay | Admitting: Internal Medicine

## 2010-04-29 ENCOUNTER — Ambulatory Visit
Admission: RE | Admit: 2010-04-29 | Discharge: 2010-04-29 | Payer: Self-pay | Source: Home / Self Care | Attending: Internal Medicine | Admitting: Internal Medicine

## 2010-04-29 LAB — CONVERTED CEMR LAB
ALT: 12 units/L (ref 0–35)
AST: 14 units/L (ref 0–37)
Albumin: 4.3 g/dL (ref 3.5–5.2)
Basophils Absolute: 0 10*3/uL (ref 0.0–0.1)
Calcium: 9.4 mg/dL (ref 8.4–10.5)
Chloride: 102 meq/L (ref 96–112)
Cholesterol: 149 mg/dL (ref 0–200)
Eosinophils Relative: 2 % (ref 0–5)
HDL: 37 mg/dL — ABNORMAL LOW (ref >39)
Lymphocytes Relative: 40 % (ref 12–46)
Neutro Abs: 2.2 10*3/uL (ref 1.7–7.7)
Platelets: 283 10*3/uL (ref 150–400)
Potassium: 5 meq/L (ref 3.5–5.3)
RDW: 14 % (ref 11.5–15.5)
Total CHOL/HDL Ratio: 4
Total Protein: 8.1 g/dL (ref 6.0–8.3)

## 2010-05-14 ENCOUNTER — Ambulatory Visit
Admission: RE | Admit: 2010-05-14 | Discharge: 2010-05-14 | Payer: Self-pay | Source: Home / Self Care | Attending: Internal Medicine | Admitting: Internal Medicine

## 2010-05-16 ENCOUNTER — Encounter (INDEPENDENT_AMBULATORY_CARE_PROVIDER_SITE_OTHER): Payer: Self-pay | Admitting: *Deleted

## 2010-05-18 ENCOUNTER — Encounter: Payer: Self-pay | Admitting: Internal Medicine

## 2010-05-27 NOTE — Progress Notes (Signed)
Summary: ADAP rx ready for pick-up  Phone Note Outgoing Call   Call placed by: Jennet Maduro RN,  March 19, 2010 10:52 AM Call placed to: Patient Action Taken: Assistance medications ready for pick up Summary of Call: Spoke to pt.  Reminded of the clinic closure for the holiday. Jennet Maduro RN  March 19, 2010 10:54 AM     Prescriptions: AZITHROMYCIN 600 MG TABS (AZITHROMYCIN) take 2 tablets once a week  #8 x 0   Entered by:   Jennet Maduro RN   Authorized by:   Yisroel Ramming MD   Signed by:   Jennet Maduro RN on 03/19/2010   Method used:   Samples Given   RxID:   [id number]

## 2010-05-27 NOTE — Progress Notes (Signed)
Summary: NCADAP/pt assist meds arrived for May  Phone Note Refill Request      Prescriptions: ISENTRESS 400 MG TABS (RALTEGRAVIR POTASSIUM) Take 1 tablet by mouth two times a day  #60 x 0   Entered by:   Paulo Fruit  BS,CPht II,MPH   Authorized by:   Yisroel Ramming MD   Signed by:   Paulo Fruit  BS,CPht II,MPH on 09/18/2009   Method used:   Samples Given   RxID:   [id number] TRUVADA 200-300 MG  TABS (EMTRICITABINE-TENOFOVIR) Take 1 tablet by mouth once a day  #30 x 0   Entered by:   Paulo Fruit  BS,CPht II,MPH   Authorized by:   Yisroel Ramming MD   Signed by:   Paulo Fruit  BS,CPht II,MPH on 09/18/2009   Method used:   Samples Given   RxID:   [id number] BACTRIM DS 800-160 MG TABS (SULFAMETHOXAZOLE-TRIMETHOPRIM) .qdtab  #30 x 0   Entered by:   Paulo Fruit  BS,CPht II,MPH   Authorized by:   Yisroel Ramming MD   Signed by:   Paulo Fruit  BS,CPht II,MPH on 09/18/2009   Method used:   Samples Given   RxID:   [id number]   Patient Assist Medication Verification: Medication: Isentress 400mg  Lot# [id number] Exp Date:05 2013 Tech approval:MLD                Patient Assist Medication Verification: Medication:Truvada Lot# [id number] Exp Date:11 2014 Tech approval:MLD               Patient Assist Medication Verification: Medication name:SMZ-TMP DS 800/160mg  RX # [id number] Tech approval:MLD  Patient picked up April's medication during her lab visit earlier this week. Paulo Fruit  BS,CPht II,MPH  Sep 18, 2009 9:13 AM

## 2010-05-27 NOTE — Miscellaneous (Signed)
Summary: med list  Clinical Lists Changes  Medications: Added new medication of POTASSIUM CHLORIDE 20 MEQ PACK (POTASSIUM CHLORIDE) take one tablet one time a day

## 2010-05-27 NOTE — Letter (Signed)
Summary: [place] [facility]  [facility]   Imported By: Florinda Marker 07/18/2009 16:13:44  _____________________________________________________________________  External Attachment:    Type:   Image     Comment:   External Document

## 2010-05-27 NOTE — Miscellaneous (Signed)
Summary: Appointment No Show  Appointment status changed to no show by [employer] on 07/30/2009 4:53 PM.  No Show Comments ---------------- lab[mkj]  Appointment Information ----------------------- Appt Type:  LAB NO DOCUMENT      Date:  Tuesday, July 30, 2009      Time:  9:30 AM for 30 min   Urgency:  Routine   Made By:  Pearson Grippe  To Visit:  [id number]    Reason:  lab[mkj]  Appt Comments ------------- -- 07/30/09 16:53: ([facility]) NO SHOW -- lab[mkj] -- 07/29/09 13:08: ([facility]) BOOKED -- Routine LAB NO DOCUMENT at 07/30/2009 9:30 AM for 30 min lab[mkj] -- 07/29/09 13:07: ([facility]) BOOKED -- Routine LAB NO DOCUMENT at 07/30/2009 9:30 AM for 30 min lab[mkj] --

## 2010-05-27 NOTE — Progress Notes (Signed)
Summary: NCADAP/pt assist med arrived for Jul  Phone Note Refill Request      Prescriptions: LOTRISONE 1-0.05 % CREA (CLOTRIMAZOLE-BETAMETHASONE) apply to affect area two times a day  #3 tubes x 0   Entered by:   Paulo Fruit  BS,CPht II,MPH   Authorized by:   Yisroel Ramming MD   Signed by:   Paulo Fruit  BS,CPht II,MPH on 10/29/2009   Method used:   Samples Given   RxID:   [id number] ISENTRESS 400 MG TABS (RALTEGRAVIR POTASSIUM) Take 1 tablet by mouth two times a day  #60 x 0   Entered by:   Paulo Fruit  BS,CPht II,MPH   Authorized by:   Yisroel Ramming MD   Signed by:   Paulo Fruit  BS,CPht II,MPH on 10/29/2009   Method used:   Samples Given   RxID:   [id number] TRUVADA 200-300 MG  TABS (EMTRICITABINE-TENOFOVIR) Take 1 tablet by mouth once a day  #30 x 0   Entered by:   Paulo Fruit  BS,CPht II,MPH   Authorized by:   Yisroel Ramming MD   Signed by:   Paulo Fruit  BS,CPht II,MPH on 10/29/2009   Method used:   Samples Given   RxID:   [id number] BACTRIM DS 800-160 MG TABS (SULFAMETHOXAZOLE-TRIMETHOPRIM) .qdtab  #30 x 0   Entered by:   Paulo Fruit  BS,CPht II,MPH   Authorized by:   Yisroel Ramming MD   Signed by:   Paulo Fruit  BS,CPht II,MPH on 10/29/2009   Method used:   Samples Given   RxID:   [id number]  Patient Assist Medication Verification: Medication name: Isentress 400mg  RX # [id number] Tech approval:MLD  Patient Assist Medication Verification: Medication name:Truvda RX #  [id number] Tech approval:MLD  Patient Assist Medication Verification: Medication name:Sulfameth/Trimethoprim 800/160mg  RX # [id number] Tech approval:MLD   Patient Assist Medication Verification: Medication:Lotrisone cream RX: [id number] Lot# [id number] Exp Date:Sep 12 Tech approval:MLD Tried to contact patient.  Her phone number at the time call was placed to her does not accept incoming calls. Paulo Fruit  BS,CPht II,MPH  October 29, 2009 10:05 AM

## 2010-05-27 NOTE — Assessment & Plan Note (Signed)
Summary: F/U OV/VS   CC:  follow-up visit, lab results, and needs RX for megace.  History of Present Illness: Pt stopped taking her HIV meds because she has been depressed and she does not see the point. She agreed to start an antidepressant and see our MH counselor. She is not suicidal.  Preventive Screening-Counseling & Management  Alcohol-Tobacco     Alcohol drinks/day: 1     Smoking Status: current     Smoking Cessation Counseling: yes     Packs/Day: 0.5     Passive Smoke Exposure: yes  Caffeine-Diet-Exercise     Caffeine use/day: 2     Does Patient Exercise: no  Hep-HIV-STD-Contraception     HIV Risk: no  Safety-Violence-Falls     Seat Belt Use: 100      Sexual History:  currently monogamous.        Drug Use:  never.        Blood Transfusions:  no.        Travel History:  none.    Comments: pt. given condoms   Updated Prior Medication List: BACTRIM DS 800-160 MG TABS (SULFAMETHOXAZOLE-TRIMETHOPRIM) .qdtab TRUVADA 200-300 MG  TABS (EMTRICITABINE-TENOFOVIR) Take 1 tablet by mouth once a day ALDARA 5 %  CREA (IMIQUIMOD) Apply at bedtime 3 times per week for 16 weeks. Leave on 6-10 hours then wash off. ISENTRESS 400 MG TABS (RALTEGRAVIR POTASSIUM) Take 1 tablet by mouth two times a day MEGACE ES 625 MG/5ML SUSP (MEGESTROL ACETATE) 5ml by mough once a day LOTRISONE 1-0.05 % CREA (CLOTRIMAZOLE-BETAMETHASONE) apply to affect area two times a day ZOLOFT 50 MG TABS (SERTRALINE HCL) Take 1 tablet by mouth once a day  Current Allergies (reviewed today): No known allergies  Review of Systems       The patient complains of anorexia and weight loss.  The patient denies fever, abdominal pain, and severe indigestion/heartburn.    Vital Signs:  Patient profile:   25 year old female Menstrual status:  regular Height:      63 inches (160.02 cm) Weight:      91.8 pounds (41.73 kg) BMI:     16.32 Temp:     98.6 degrees F (37.00 degrees C) oral Pulse rate:   82 /  minute BP sitting:   117 / 77  (right arm)  Vitals Entered By: Wendall Mola CMA Duncan Dull) (October 03, 2009 10:23 AM) CC: follow-up visit, lab results, needs RX for megace Is Patient Diabetic? No Pain Assessment Patient in pain? no      Nutritional Status BMI of < 19 = underweight Nutritional Status Detail appetite "not good"  Does patient need assistance? Functional Status Self care Ambulation Normal Comments pt. has not been taking HAART meds regularly   Physical Exam  General:  alert, well-hydrated, and underweight appearing.   Head:  normocephalic and atraumatic.   Mouth:  pharynx pink and moist.  no thrush  Lungs:  normal breath sounds.     Impression & Recommendations:  Problem # 1:  HIV DISEASE (ICD-042) Encourage pt to take her meds.  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 Take 1 tablet by mouth once a day  Medications Added to Medication List This Visit: 1)  Zoloft 50 Mg Tabs (Sertraline hcl) .... Take 1 tablet by mouth once a day  Patient Instructions: 1)  Please schedule a follow-up appointment in 4-6 weeks. 2)  Please schedule with Alisa (MH) Prescriptions: ZOLOFT 50 MG TABS (SERTRALINE HCL) Take 1 tablet by mouth once a day  #30 x 5   Entered and Authorized by:   Yisroel Ramming MD   Signed by:   Yisroel Ramming MD on 10/03/2009   Method used:   Print then Give to Patient   RxID:   [id number]

## 2010-05-27 NOTE — Assessment & Plan Note (Signed)
Summary: 6WK F/U/VS   CC:  follow-up visit on meds, pt. never received Bactrim, and Megace or Aldara cream.  History of Present Illness: Pt feels better.  she has been taking the zoloft which she thinks is helping her depression.  She re-started her HIV meds and has been taking them every day.  She is out of her megace and aldara cream.  Depression History:      The patient denies a depressed mood most of the day and a diminished interest in her usual daily activities.        The patient denies that she feels like life is not worth living, denies that she wishes that she were dead, and denies that she has thought about ending her life.        Preventive Screening-Counseling & Management  Alcohol-Tobacco     Alcohol drinks/day: 1     Smoking Status: current     Smoking Cessation Counseling: yes     Packs/Day: 0.5     Passive Smoke Exposure: yes  Caffeine-Diet-Exercise     Caffeine use/day: 2     Does Patient Exercise: no  Hep-HIV-STD-Contraception     HIV Risk: no  Safety-Violence-Falls     Seat Belt Use: 100      Sexual History:  currently monogamous.        Drug Use:  never.        Blood Transfusions:  no.        Travel History:  none.    Comments: pt. declined condoms   Updated Prior Medication List: BACTRIM DS 800-160 MG TABS (SULFAMETHOXAZOLE-TRIMETHOPRIM) .qdtab TRUVADA 200-300 MG  TABS (EMTRICITABINE-TENOFOVIR) Take 1 tablet by mouth once a day ISENTRESS 400 MG TABS (RALTEGRAVIR POTASSIUM) Take 1 tablet by mouth two times a day MEGACE ES 625 MG/5ML SUSP (MEGESTROL ACETATE) 5ml by mough once a day LOTRISONE 1-0.05 % CREA (CLOTRIMAZOLE-BETAMETHASONE) apply to affect area two times a day ZOLOFT 50 MG TABS (SERTRALINE HCL) Take 1 tablet by mouth once a day  Current Allergies (reviewed today): No known allergies  Past History:  Past Medical History: Last updated: 05/26/2006 HIV disease  Review of Systems  The patient denies anorexia, fever, and weight  loss.    Vital Signs:  Patient profile:   25 year old female Menstrual status:  regular Height:      63 inches (160.02 cm) Weight:      96.0 pounds (43.64 kg) BMI:     17.07 Temp:     98.5 degrees F (36.94 degrees C) oral Pulse rate:   73 / minute BP sitting:   113 / 72  (right arm)  Vitals Entered By: Wendall Mola CMA Duncan Dull) (November 18, 2009 10:12 AM) CC: follow-up visit on meds, pt. never received Bactrim, Megace or Aldara cream Is Patient Diabetic? No Pain Assessment Patient in pain? no      Nutritional Status BMI of < 19 = underweight Nutritional Status Detail appetite "better"  Does patient need assistance? Functional Status Self care Ambulation Normal Comments pt. missed one dose of HAART meds because she has run out Has been feeling better since starting meds requesting ensure   Physical Exam  General:  alert, well-hydrated, and underweight appearing.   Head:  normocephalic and atraumatic.   Mouth:  pharynx pink and moist.  no thrush  Lungs:  normal breath sounds.     Impression & Recommendations:  Problem # 1:  HIV DISEASE (ICD-042) Will obtain labs today and have patient f/u  in 2 weeks for results. Will refer to Byrd Hesselbach to get her enrolled in PAP program for the megace. Orders: Est. Patient Level III (09811) T-CBC w/Diff ([phone]) T-Comprehensive Metabolic Panel ([phone]) T-CD4SP ([facility]) (CD4SP) T-HIV1 Quant rflx Ultra or Genotype (96295-28413)  Problem # 2:  DEPRESSION (ICD-311) Assessment: Improved Pt taking 1/2 of the zoloft once daily. She did not f/u with MH counseling with Alisa. Her updated medication list for this problem includes:    Zoloft 50 Mg Tabs (Sertraline hcl) .Marland Kitchen... Take 1 tablet by mouth once a day  Patient Instructions: 1)  Please schedule a follow-up appointment in 2 weeks.

## 2010-05-27 NOTE — Miscellaneous (Signed)
Summary: [name]  [name]   Imported By: Florinda Marker 03/10/2010 14:48:46  _____________________________________________________________________  External Attachment:    Type:   Image     Comment:   External Document

## 2010-05-27 NOTE — Progress Notes (Signed)
Summary: Pt assist med arrived 1 month supply nxt refill is 8/22  Phone Note Refill Request      Prescriptions: MEGACE ES 625 MG/5ML SUSP (MEGESTROL ACETATE) 5ml by mough once a day  #150 x 0   Entered by:   Paulo Fruit  BS,CPht II,MPH   Authorized by:   Yisroel Ramming MD   Signed by:   Paulo Fruit  BS,CPht II,MPH on 11/26/2009   Method used:   Samples Given   RxID:   [id number]   Patient Assist Medication Verification: Medication: Megace ES 625mg /10ml Lot# [id number] Exp Date:Feb 2014 Tech approval:MLD Call placed to patient with message that assistance medications are ready for pick-up. Patient will pick up tomorow 11/27/09 morning along with her other medications.  Next reorder is 8/22 Paulo Fruit  BS,CPht II,MPH  November 26, 2009 3:40 PM                  Appended Document: Pt assist med arrived 1 month supply nxt refill is 8/22 Prescription/Samples picked up by: patient  all medication

## 2010-05-27 NOTE — Miscellaneous (Signed)
  Clinical Lists Changes  Observations: Added new observation of YEARAIDSPOS: 2006  (03/18/2010 14:49)

## 2010-05-27 NOTE — Progress Notes (Signed)
Summary: [facility]ADAP/pt assist meds arrived for Aug  Phone Note Refill Request      Prescriptions: ISENTRESS 400 MG TABS (RALTEGRAVIR POTASSIUM) Take 1 tablet by mouth two times a day  #60 x 0   Entered by:   Paulo Fruit  BS,CPht II,MPH   Authorized by:   Yisroel Ramming MD   Signed by:   Paulo Fruit  BS,CPht II,MPH on 11/25/2009   Method used:   Samples Given   RxID:   [id number] TRUVADA 200-300 MG  TABS (EMTRICITABINE-TENOFOVIR) Take 1 tablet by mouth once a day  #30 x 0   Entered by:   Paulo Fruit  BS,CPht II,MPH   Authorized by:   Yisroel Ramming MD   Signed by:   Paulo Fruit  BS,CPht II,MPH on 11/25/2009   Method used:   Samples Given   RxID:   [id number] BACTRIM DS 800-160 MG TABS (SULFAMETHOXAZOLE-TRIMETHOPRIM) .qdtab  #30 x 0   Entered by:   Paulo Fruit  BS,CPht II,MPH   Authorized by:   Yisroel Ramming MD   Signed by:   Paulo Fruit  BS,CPht II,MPH on 11/25/2009   Method used:   Samples Given   RxID:   [id number]  Patient Assist Medication Verification: Medication name: Truvada  RX # [id number] Tech approval:MLD  Patient Assist Medication Verification: Medication name:sulfameth/trimethoprim 800/160mg  RX # [id number] Tech approval:MLD  Patient Assist Medication Verification: Medication name:Isentress 400mg  RX # [id number] Tech approval:MLD  Patient just picked up medications last week of July 25th. Paulo Fruit  BS,CPht II,MPH  November 25, 2009 11:59 AM

## 2010-05-27 NOTE — Progress Notes (Signed)
Summary: NCADAP/pt assist meds arrived for Jan  Phone Note Refill Request      Prescriptions: ISENTRESS 400 MG TABS (RALTEGRAVIR POTASSIUM) Take 1 tablet by mouth two times a day  #60 x 0   Entered by:   Paulo Fruit  BS,CPht II,MPH   Authorized by:   Yisroel Ramming MD   Signed by:   Paulo Fruit  BS,CPht II,MPH on 05/01/2009   Method used:   Samples Given   RxID:   [id number] TRUVADA 200-300 MG  TABS (EMTRICITABINE-TENOFOVIR) Take 1 tablet by mouth once a day  #30 x 0   Entered by:   Paulo Fruit  BS,CPht II,MPH   Authorized by:   Yisroel Ramming MD   Signed by:   Paulo Fruit  BS,CPht II,MPH on 05/01/2009   Method used:   Samples Given   RxID:   [id number] BACTRIM DS 800-160 MG TABS (SULFAMETHOXAZOLE-TRIMETHOPRIM) .qdtab  #30 x 0   Entered by:   Paulo Fruit  BS,CPht II,MPH   Authorized by:   Yisroel Ramming MD   Signed by:   Paulo Fruit  BS,CPht II,MPH on 05/01/2009   Method used:   Samples Given   RxID:   [id number]   Patient Assist Medication Verification: Medication: Truvada Lot# [id number] Exp Date:05 2014 Tech approval:MLD               Patient Assist Medication Verification: Medication name:SMZ-TMP DS 800/160mg  RX # [id number] Tech approval:MLD   Patient Assist Medication Verification: Medication:Isentress 400mg  VQQ#[id number] Exp Date:11 2012 Tech approval:MLD Call placed to patient with message that assistance medications are ready for pick-up. Left message Paulo Fruit  BS,CPht II,MPH  May 01, 2009 3:17 PM

## 2010-05-27 NOTE — Progress Notes (Signed)
Summary: NCADAP/pt assist meds arrived for Apr  Phone Note Refill Request      Prescriptions: ISENTRESS 400 MG TABS (RALTEGRAVIR POTASSIUM) Take 1 tablet by mouth two times a day  #60 x 0   Entered by:   Paulo Fruit  BS,CPht II,MPH   Authorized by:   Yisroel Ramming MD   Signed by:   Paulo Fruit  BS,CPht II,MPH on 07/31/2009   Method used:   Samples Given   RxID:   [id number] TRUVADA 200-300 MG  TABS (EMTRICITABINE-TENOFOVIR) Take 1 tablet by mouth once a day  #30 x 0   Entered by:   Paulo Fruit  BS,CPht II,MPH   Authorized by:   Yisroel Ramming MD   Signed by:   Paulo Fruit  BS,CPht II,MPH on 07/31/2009   Method used:   Samples Given   RxID:   [id number] BACTRIM DS 800-160 MG TABS (SULFAMETHOXAZOLE-TRIMETHOPRIM) .qdtab  #30 x 0   Entered by:   Paulo Fruit  BS,CPht II,MPH   Authorized by:   Yisroel Ramming MD   Signed by:   Paulo Fruit  BS,CPht II,MPH on 07/31/2009   Method used:   Samples Given   RxID:   [id number]  Patient Assist Medication Verification: Medication name: SMZ-TMP DS 800/160mg  RX # [id number] Tech approval:MLD   Patient Assist Medication Verification: Medication:Isentress 400mg  Lot# [id number] Exp Date:08 2012 Tech approval:MLD                Patient Assist Medication Verification: Medication:Truvada Lot# [id number] Exp Date:09 2014 Tech approval:MLD               Call placed to patient with message that assistance medications are ready for pick-up. Also told patient she needs to reschedule her missed appt when she comes to pickup her medications Paulo Fruit  BS,CPht II,MPH  July 31, 2009 10:50 AM

## 2010-05-27 NOTE — Miscellaneous (Signed)
Summary: Problem list update  Clinical Lists Changes  Problems: Added new problem of SCREENING FOR MALIGNANT NEOPLASM OF THE CERVIX (ICD-V76.2) 

## 2010-05-27 NOTE — Progress Notes (Signed)
Summary: NCADAP/pt assist meds arrived for Apr  Phone Note Refill Request      Prescriptions: ISENTRESS 400 MG TABS (RALTEGRAVIR POTASSIUM) Take 1 tablet by mouth two times a day  #60 x 0   Entered by:   Paulo Fruit  BS,CPht II,MPH   Authorized by:   Yisroel Ramming MD   Signed by:   Paulo Fruit  BS,CPht II,MPH on 08/21/2009   Method used:   Samples Given   RxID:   [id number] TRUVADA 200-300 MG  TABS (EMTRICITABINE-TENOFOVIR) Take 1 tablet by mouth once a day  #30 x 0   Entered by:   Paulo Fruit  BS,CPht II,MPH   Authorized by:   Yisroel Ramming MD   Signed by:   Paulo Fruit  BS,CPht II,MPH on 08/21/2009   Method used:   Samples Given   RxID:   [id number] BACTRIM DS 800-160 MG TABS (SULFAMETHOXAZOLE-TRIMETHOPRIM) .qdtab  #30 x 0   Entered by:   Paulo Fruit  BS,CPht II,MPH   Authorized by:   Yisroel Ramming MD   Signed by:   Paulo Fruit  BS,CPht II,MPH on 08/21/2009   Method used:   Samples Given   RxID:   [id number]   Patient Assist Medication Verification: Medication: Isentress 400mg  Lot# [id number] Exp Date:03 2013 Tech approval:MLD               Patient Assist Medication Verification: Medication name:SMZ-TMP DS 800mg /160mg  RX # [id number]   Tech approval:MLD   Patient Assist Medication Verification: Medication:Truvada Lot# [id number] Exp Date:09 2014 Tech approval:MLD Call placed to patient with message that assistance medications are ready for pick-up. Left message on patient's VM for her to contact [facility] @ [phone] Paulo Fruit  BS,CPht II,MPH  August 21, 2009 3:19 PM

## 2010-05-27 NOTE — Miscellaneous (Signed)
Summary: clinical update/ryan white NCADAP apprv til 07/26/10  Clinical Lists Changes  Observations: Added new observation of AIDSDAP: Yes 2011 (08/05/2009 11:20) Added new observation of PAYOR: No Insurance (08/05/2009 11:20)

## 2010-05-27 NOTE — Miscellaneous (Signed)
Summary: Orders Update  Clinical Lists Changes  Orders: Added new Referral order of Misc. Referral (Misc. Ref) - Signed 

## 2010-05-27 NOTE — Assessment & Plan Note (Signed)
Summary: F/U OV/VS   Primary Provider:  Yisroel Ramming MD  CC:  follow-up visit, lab results, and c/o left leg pain x 1 week.  History of Present Illness: patient here for follow-up on her lab results.  She states she's been taking her medications regularly.  Depression History:      The patient denies a depressed mood most of the day and a diminished interest in her usual daily activities.        The patient denies that she feels like life is not worth living, denies that she wishes that she were dead, and denies that she has thought about ending her life.        Preventive Screening-Counseling & Management  Alcohol-Tobacco     Alcohol drinks/day: 1     Smoking Status: current     Smoking Cessation Counseling: yes     Packs/Day: 0.5     Passive Smoke Exposure: yes  Caffeine-Diet-Exercise     Caffeine use/day: 2     Does Patient Exercise: no  Hep-HIV-STD-Contraception     HIV Risk: no risk noted  Safety-Violence-Falls     Seat Belt Use: 100      Sexual History:  currently monogamous.        Drug Use:  never.        Blood Transfusions:  no.        Travel History:  none.    Comments: pt. declined condoms   Updated Prior Medication List: BACTRIM DS 800-160 MG TABS (SULFAMETHOXAZOLE-TRIMETHOPRIM) .qdtab TRUVADA 200-300 MG  TABS (EMTRICITABINE-TENOFOVIR) Take 1 tablet by mouth once a day MEGACE ES 625 MG/5ML SUSP (MEGESTROL ACETATE) 5ml by mough once a day LOTRISONE 1-0.05 % CREA (CLOTRIMAZOLE-BETAMETHASONE) apply to affect area two times a day ZOLOFT 50 MG TABS (SERTRALINE HCL) Take 1 tablet by mouth once a day POTASSIUM CHLORIDE 20 MEQ PACK (POTASSIUM CHLORIDE) take one tablet one time a day PREZISTA 400 MG TABS (DARUNAVIR ETHANOLATE) Take 2 tablets by mouth once a day NORVIR 100 MG TABS (RITONAVIR) Take 1 tablet by mouth once a day AZITHROMYCIN 600 MG TABS (AZITHROMYCIN) take 2 tablets once a week  Current Allergies (reviewed today): No known allergies  Past  History:  Past Medical History: Last updated: 05/26/2006 HIV disease  Review of Systems  The patient denies anorexia, fever, and weight loss.    Vital Signs:  Patient profile:   25 year old female Menstrual status:  regular Height:      63 inches (160.02 cm) Weight:      102.8 pounds (46.73 kg) BMI:     18.28 Temp:     98.8 degrees F (37.11 degrees C) oral Pulse rate:   81 / minute BP sitting:   118 / 78  (right arm)  Vitals Entered By: Wendall Mola CMA ( AAMA) (March 14, 2010 9:30 AM) CC: follow-up visit, lab results, c/o left leg pain x 1 week Is Patient Diabetic? No Pain Assessment Patient in pain? yes     Location: left leg Intensity: 8 Type: throbbing Onset of pain  Constant Nutritional Status BMI of < 19 = underweight Nutritional Status Detail appetite "good"  Have you ever been in a relationship where you felt threatened, hurt or afraid?No   Does patient need assistance? Functional Status Self care Ambulation Normal Comments pt. missed one dose of meds since last visit   Physical Exam  General:  alert, well-developed, well-nourished, and well-hydrated.   Head:  normocephalic and atraumatic.   Mouth:  pharynx pink and moist.   Lungs:  normal breath sounds.     Impression & Recommendations:  Problem # 1:  HIV DISEASE (ICD-042) did not believe the patient has been taking her medications as we have several shipments in our storage closet that she has not picked up.  I will were changed her regimen to Truvada prezista and norvir and have her follow up for labs in 6 weeks.  Potential side effects were discussed. Diagnostics Reviewed:  HIV: CDC-defined AIDS (05/01/2009)   CD4: 40 (02/05/2010)   WBC: 3.6 (02/04/2010)   Hgb: 12.7 (02/04/2010)   HCT: 36.8 (02/04/2010)   Platelets: 223 (02/04/2010) HIV genotype: See Comment (11/18/2009)   HIV-1 RNA: 16109 (02/04/2010)   HBSAg: No (06/21/2006)  Future Orders: T-Hepatitis B Surface Antibody  ([id number]) ... 04/25/2010  Medications Added to Medication List This Visit: 1)  Prezista 400 Mg Tabs (Darunavir ethanolate) .... Take 2 tablets by mouth once a day 2)  Norvir 100 Mg Tabs (Ritonavir) .... Take 1 tablet by mouth once a day 3)  Azithromycin 600 Mg Tabs (Azithromycin) .... Take 2 tablets once a week  Other Orders: Est. Patient Level III (91478) Future Orders: T-CD4SP ([facility]) (CD4SP) ... 04/25/2010 T-HIV Viral Load [phone]) ... 04/25/2010 T-Comprehensive Metabolic Panel [id number]) ... 04/25/2010 T-CBC w/Diff ([id number]) ... 04/25/2010 T-RPR (Syphilis) [id number]) ... 04/25/2010 T-Lipid Profile [phone]) ... 04/25/2010  Patient Instructions: 1)  Please schedule a follow-up appointment in 8 weeks, 2 weeks after labs.  Prescriptions: AZITHROMYCIN 600 MG TABS (AZITHROMYCIN) take 2 tablets once a week  #8 x 5   Entered by:   Wendall Mola CMA ( AAMA)   Authorized by:   Yisroel Ramming MD   Signed by:   Wendall Mola CMA ( AAMA) on 03/14/2010   Method used:   Telephoned to ...       [facility] [phone]* (retail)       [address]       [place], [place]  [address]       Ph: [phone]       Fax:    RxID:   [id number] BACTRIM DS 800-160 MG TABS (SULFAMETHOXAZOLE-TRIMETHOPRIM) .qdtab  #30 x 5   Entered by:   Wendall Mola CMA ( AAMA)   Authorized by:   Yisroel Ramming MD   Signed by:   Wendall Mola CMA ( AAMA) on 03/14/2010   Method used:   Telephoned to ...       [facility] [phone]* (retail)       [address]       [place], [place]  [address]       Ph: [phone]       Fax:    RxID:   [id number] NORVIR 100 MG TABS (RITONAVIR) Take 1 tablet by mouth once a day  #30 x 5   Entered by:   Wendall Mola CMA ( AAMA)   Authorized by:   Yisroel Ramming MD   Signed by:   Wendall Mola CMA ( AAMA) on 03/14/2010   Method used:   Telephoned to ...       [facility] [phone]* (retail)       [address]       [place],  [place]  [address]       Ph: [phone]       Fax:    RxID:   [id number] PREZISTA 400 MG TABS (DARUNAVIR ETHANOLATE) Take 2 tablets by mouth once a day  #60 x 5   Entered by:   Adela Lank  Cockerham CMA ( AAMA)   Authorized by:   Yisroel Ramming MD   Signed by:   Wendall Mola CMA ( AAMA) on 03/14/2010   Method used:   Telephoned to ...       [facility] [phone]* (retail)       [address]       [place], [place]  [address]       Ph: [phone]       Fax:    RxID:   [id number] TRUVADA 200-300 MG  TABS (EMTRICITABINE-TENOFOVIR) Take 1 tablet by mouth once a day  #30 x 5   Entered by:   Wendall Mola CMA ( AAMA)   Authorized by:   Yisroel Ramming MD   Signed by:   Wendall Mola CMA ( AAMA) on 03/14/2010   Method used:   Telephoned to ...       [facility] [phone]* (retail)       [address]       [place], [place]  [address]       Ph: [phone]       Fax:    RxID:   [id number]

## 2010-05-27 NOTE — Progress Notes (Signed)
Summary: [facility]/pt assist meds arrived for Mar  Phone Note Refill Request      Prescriptions: ISENTRESS 400 MG TABS (RALTEGRAVIR POTASSIUM) Take 1 tablet by mouth two times a day  #60 x 0   Entered by:   Paulo Fruit  BS,CPht II,MPH   Authorized by:   Yisroel Ramming MD   Signed by:   Paulo Fruit  BS,CPht II,MPH on 07/01/2009   Method used:   Samples Given   RxID:   [id number] TRUVADA 200-300 MG  TABS (EMTRICITABINE-TENOFOVIR) Take 1 tablet by mouth once a day  #30 x 0   Entered by:   Paulo Fruit  BS,CPht II,MPH   Authorized by:   Yisroel Ramming MD   Signed by:   Paulo Fruit  BS,CPht II,MPH on 07/01/2009   Method used:   Samples Given   RxID:   [id number] BACTRIM DS 800-160 MG TABS (SULFAMETHOXAZOLE-TRIMETHOPRIM) .qdtab  #30 x 0   Entered by:   Paulo Fruit  BS,CPht II,MPH   Authorized by:   Yisroel Ramming MD   Signed by:   Paulo Fruit  BS,CPht II,MPH on 07/01/2009   Method used:   Samples Given   RxID:   [id number]   Patient Assist Medication Verification: Medication: Truvada Lot# [id number] Exp Date:09 2014 Tech approval:MLD               Patient Assist Medication Verification: Medication name:SMZ-TMP DS 800/160mg  RX # [id number] Tech approval:MLD   Patient Assist Medication Verification: Medication:Isentress 400mg  Lot# [id number] Exp Date:02 2013 Tech approval:MLD Call placed to patient with message that assistance medications are ready for pick-up. Left Woody Seller  BS,CPht II,MPH  July 01, 2009 2:59 PM

## 2010-05-27 NOTE — Assessment & Plan Note (Signed)
Summary: 6WK F/U [MKJ]   CC:  follow-up visit and pt.c/o thrush.  History of Present Illness: Pt here for f/u.  She c/o thrush and pain with swallowing. Pt states that she has been taking her HIV meds for the past couple of months.  Depression History:      The patient denies a depressed mood most of the day and a diminished interest in her usual daily activities.        The patient denies that she feels like life is not worth living, denies that she wishes that she were dead, and denies that she has thought about ending her life.        Preventive Screening-Counseling & Management  Alcohol-Tobacco     Alcohol drinks/day: 1     Smoking Status: current     Smoking Cessation Counseling: yes     Packs/Day: 0.5     Passive Smoke Exposure: yes  Caffeine-Diet-Exercise     Caffeine use/day: 2     Does Patient Exercise: no  Hep-HIV-STD-Contraception     HIV Risk: risk noted  Safety-Violence-Falls     Seat Belt Use: 100      Sexual History:  currently monogamous.        Drug Use:  never.        Blood Transfusions:  no.        Travel History:  none.    Comments: pt. declined condoms   Updated Prior Medication List: BACTRIM DS 800-160 MG TABS (SULFAMETHOXAZOLE-TRIMETHOPRIM) .qdtab TRUVADA 200-300 MG  TABS (EMTRICITABINE-TENOFOVIR) Take 1 tablet by mouth once a day ISENTRESS 400 MG TABS (RALTEGRAVIR POTASSIUM) Take 1 tablet by mouth two times a day MEGACE ES 625 MG/5ML SUSP (MEGESTROL ACETATE) 5ml by mough once a day LOTRISONE 1-0.05 % CREA (CLOTRIMAZOLE-BETAMETHASONE) apply to affect area two times a day ZOLOFT 50 MG TABS (SERTRALINE HCL) Take 1 tablet by mouth once a day  Current Allergies (reviewed today): No known allergies  Past History:  Past Medical History: Last updated: 05/26/2006 HIV disease  Review of Systems  The patient denies anorexia, fever, and weight loss.    Vital Signs:  Patient profile:   25 year old female Menstrual status:  regular Height:       63 inches (160.02 cm) Weight:      100.2 pounds (45.55 kg) BMI:     17.81 Temp:     98.8 degrees F (37.11 degrees C) oral Pulse rate:   83 / minute BP sitting:   103 / 62  (right arm)  Vitals Entered By: Wendall Mola CMA Duncan Dull) (February 04, 2010 2:51 PM) CC: follow-up visit, pt.c/o thrush Is Patient Diabetic? No Pain Assessment Patient in pain? yes     Location: throat Intensity: 7 Type: aching Onset of pain  Constant Nutritional Status BMI of < 19 = underweight Nutritional Status Detail appetite "low"  Does patient need assistance? Functional Status Self care Ambulation Normal Comments no missed doses of meds per pt.   Physical Exam  General:  alert, well-developed, well-nourished, and well-hydrated.   Head:  normocephalic and atraumatic.   Mouth:  pharynx pink and moist.  thrush present Lungs:  normal breath sounds.     Impression & Recommendations:  Problem # 1:  HIV DISEASE (ICD-042) Will obtain labs today and have pt f/u in 2 weeks. Influenza and hep B vaccine given. Diagnostics Reviewed:  HIV: CDC-defined AIDS (05/01/2009)   CD4: 60 (11/19/2009)   WBC: 3.5 (11/18/2009)   Hgb: 13.0 (11/18/2009)  HCT: 37.9 (11/18/2009)   Platelets: 239 (11/18/2009) HIV genotype: See Comment (11/18/2009)   HIV-1 RNA: 30100 (11/18/2009)   HBSAg: No (06/21/2006)  Problem # 2:  THRUSH (ICD-112.0) flucoanzole 100mg  once daily for 10 days. sample given  Other Orders: T-CD4SP [facility]) (CD4SP) T-HIV Viral Load [phone]) T-Comprehensive Metabolic Panel [phone]) T-CBC w/Diff [phone]) Est. Patient Level III (57846) Influenza Vaccine NON MCR (96295) Hepatitis B Vaccine >21yrs (28413) Admin 1st Vaccine (24401)  Patient Instructions: 1)  Please schedule a follow-up appointment in 2 weeks.      Sample of Fluconazole 100 mg. Lot # [id number] Exp. 02/26/11 #10  Immunizations Administered:  Influenza Vaccine # 1:    Vaccine Type: Fluvax Non-MCR     Site: right deltoid    Mfr: Novartis    Dose: 0.5 ml    Route: IM    Given by: Wendall Mola CMA ( AAMA)    Exp. Date: 07/27/2010    Lot #: [id number]    VIS given: 11/19/09 version given February 04, 2010.  Hepatitis B Vaccine # 1:    Vaccine Type: HepB Adult    Site: left deltoid    Mfr: Merck    Dose: 0.5 ml    Route: IM    Given by: Wendall Mola CMA ( AAMA)    Exp. Date: 04/10/2012    Lot #: [id number]    VIS given: 11/11/05 version given February 04, 2010.  Flu Vaccine Consent Questions:    Do you have a history of severe allergic reactions to this vaccine? no    Any prior history of allergic reactions to egg and/or gelatin? no    Do you have a sensitivity to the preservative Thimersol? no    Do you have a past history of Guillan-Barre Syndrome? no    Do you currently have an acute febrile illness? no    Have you ever had a severe reaction to latex? no    Vaccine information given and explained to patient? yes    Are you currently pregnant? no

## 2010-05-27 NOTE — Progress Notes (Signed)
Summary: ncadap meds arrived for aug  Phone Note Refill Request      Prescriptions: ISENTRESS 400 MG TABS (RALTEGRAVIR POTASSIUM) Take 1 tablet by mouth two times a day  #60 x 0   Entered by:   Paulo Fruit  BS,CPht II,MPH   Authorized by:   Yisroel Ramming MD   Signed by:   Paulo Fruit  BS,CPht II,MPH on 12/24/2009   Method used:   Samples Given   RxID:   [id number] TRUVADA 200-300 MG  TABS (EMTRICITABINE-TENOFOVIR) Take 1 tablet by mouth once a day  #30 x 0   Entered by:   Paulo Fruit  BS,CPht II,MPH   Authorized by:   Yisroel Ramming MD   Signed by:   Paulo Fruit  BS,CPht II,MPH on 12/24/2009   Method used:   Samples Given   RxID:   [id number] BACTRIM DS 800-160 MG TABS (SULFAMETHOXAZOLE-TRIMETHOPRIM) .qdtab  #30 x 0   Entered by:   Paulo Fruit  BS,CPht II,MPH   Authorized by:   Yisroel Ramming MD   Signed by:   Paulo Fruit  BS,CPht II,MPH on 12/24/2009   Method used:   Samples Given   RxID:   [id number]  Patient Assist Medication Verification: Medication name: Sulfameth/trimthoprim 800/160mg  RX # [id number] Tech approval:MLD  Patient Assist Medication Verification: Medication name:Truvada RX # [id number] Tech approval:MLD  Patient Assist Medication Verification: Medication name:Isentress 400mg  RX # [id number] Tech approval:MLD Call placed to patient with message that assistance medications are ready for pick-up. Paulo Fruit  BS,CPht II,MPH  December 24, 2009 2:46 PM

## 2010-05-27 NOTE — Assessment & Plan Note (Signed)
Summary: CHECKUP/SB.   CC:  f/u labs, cough, congestion, runny nose, and fever 2 days ago.  History of Present Illness: Pt doing well.  She had a cold last week but feels better now.  She is taking her HIV meds and feels better. She has more energy.  Preventive Screening-Counseling & Management  Alcohol-Tobacco     Alcohol drinks/day: 1     Smoking Status: current     Smoking Cessation Counseling: yes     Packs/Day: 0.5     Passive Smoke Exposure: yes  Hep-HIV-STD-Contraception     HIV Risk: no   Updated Prior Medication List: BACTRIM DS 800-160 MG TABS (SULFAMETHOXAZOLE-TRIMETHOPRIM) .qdtab TRUVADA 200-300 MG  TABS (EMTRICITABINE-TENOFOVIR) Take 1 tablet by mouth once a day ALDARA 5 %  CREA (IMIQUIMOD) Apply at bedtime 3 times per week for 16 weeks. Leave on 6-10 hours then wash off. ISENTRESS 400 MG TABS (RALTEGRAVIR POTASSIUM) Take 1 tablet by mouth two times a day MEGACE ES 625 MG/5ML SUSP (MEGESTROL ACETATE) 5ml by mough once a day LOTRISONE 1-0.05 % CREA (CLOTRIMAZOLE-BETAMETHASONE) apply to affect area two times a day  Current Allergies (reviewed today): No known allergies  Past History:  Past Medical History: Last updated: 05/26/2006 HIV disease  Review of Systems  The patient denies anorexia, fever, and weight loss.    Vital Signs:  Patient profile:   25 year old female Menstrual status:  regular Height:      63 inches (160.02 cm) Weight:      99.8 pounds (45.36 kg) BMI:     17.74 Pulse rate:   63 / minute BP sitting:   104 / 69  (left arm)  Vitals Entered By: Starleen Arms CMA (May 01, 2009 9:12 AM) CC: f/u labs, cough, congestion, runny nose, fever 2 days ago Is Patient Diabetic? No Pain Assessment Patient in pain? no      Nutritional Status BMI of < 19 = underweight Nutritional Status Detail nl  Does patient need assistance? Functional Status Self care Ambulation Normal   Physical Exam  General:  alert, well-hydrated, and  underweight appearing.   Head:  normocephalic and atraumatic.   Mouth:  pharynx pink and moist.  no thrush  Lungs:  normal breath sounds.          Medication Adherence: 05/01/2009   Adherence to medications reviewed with patient. Counseling to provide adequate adherence provided                                Impression & Recommendations:  Problem # 1:  HIV DISEASE (ICD-042) Pt.s most recent CD4ct was 120 and VL 2180, which is an improvement. She is finally taking her meds .  Pt instructed to continue the current antiretroviral regimen.  Pt encouraged to take medication regularly and not miss doses.  Pt will f/u in 3 months for repeat blood work and will see me 2 weeks later. Will schedule in [facility] clinic.  Diagnostics Reviewed:  CD4: 120 (04/09/2009)   WBC: 4.5 (04/09/2009)   Hgb: 14.8 (04/09/2009)   HCT: 45.2 (04/09/2009)   Platelets: 290 (04/09/2009) HIV genotype: * (04/08/2009)   HIV-1 RNA: 2180 (04/08/2009)   HBSAg: No (06/21/2006)  Future Orders: T-Hepatitis B Surface Antibody ([id number]) ... 07/30/2009  Other Orders: Est. Patient Level III (15176) Future Orders: T-CD4SP ([facility]) (CD4SP) ... 07/30/2009 T-HIV Viral Load [phone]) ... 07/30/2009 T-Comprehensive Metabolic Panel [phone]) ... 07/30/2009 T-CBC w/Diff ([id number]) .Marland KitchenMarland Kitchen  07/30/2009  Patient Instructions: 1)  Please schedule a follow-up appointment in 3 months, 2 weeks after labs.  Process Orders Check Orders Results:     [facility]: ABN not required for this insurance Tests Sent for requisitioning (May 01, 2009 9:28 AM):     07/30/2009: [facility] -- T-HIV Viral Load [phone] (signed)     07/30/2009: [facility] -- T-Comprehensive Metabolic Panel [[id number]] (signed)     07/30/2009: [facility] -- T-CBC w/Diff [[id number]] (signed)     07/30/2009: [facility] -- T-Hepatitis B Surface Antibody  [[id number]] (signed)

## 2010-05-27 NOTE — Progress Notes (Signed)
Summary: ncadap meds arrived for sept-Unable to reach pt need #  Phone Note Refill Request      Prescriptions: ISENTRESS 400 MG TABS (RALTEGRAVIR POTASSIUM) Take 1 tablet by mouth two times a day  #60 x 0   Entered by:   Paulo Fruit  BS,CPht II,MPH   Authorized by:   Yisroel Ramming MD   Signed by:   Paulo Fruit  BS,CPht II,MPH on 01/20/2010   Method used:   Samples Given   RxID:   [id number] TRUVADA 200-300 MG  TABS (EMTRICITABINE-TENOFOVIR) Take 1 tablet by mouth once a day  #30 x 0   Entered by:   Paulo Fruit  BS,CPht II,MPH   Authorized by:   Yisroel Ramming MD   Signed by:   Paulo Fruit  BS,CPht II,MPH on 01/20/2010   Method used:   Samples Given   RxID:   [id number] BACTRIM DS 800-160 MG TABS (SULFAMETHOXAZOLE-TRIMETHOPRIM) .qdtab  #30 x 0   Entered by:   Paulo Fruit  BS,CPht II,MPH   Authorized by:   Yisroel Ramming MD   Signed by:   Paulo Fruit  BS,CPht II,MPH on 01/20/2010   Method used:   Samples Given   RxID:   [id number]  Patient Assist Medication Verification: Medication name: Berenda Morale RX # [id number] Tech approval:mld  Patient Assist Medication Verification: Medication name:isentress 400mg  RX # [id number] Tech approval:mld  Patient Assist Medication Verification: Medication name:sulfameth/trimethroprim 800/160mg  RX # [id number] Tech approval:mld Tried to contact patient.  Unable to reach; need update correct phone number.  The number on file is no longer her number according to the person that answered the phone. Paulo Fruit  BS,CPht II,MPH  January 20, 2010 2:19 PM

## 2010-05-27 NOTE — Progress Notes (Signed)
Summary: [facility]/pt assist meds arrived for Feb  Phone Note Refill Request      Prescriptions: BACTRIM DS 800-160 MG TABS (SULFAMETHOXAZOLE-TRIMETHOPRIM) .qdtab  #30 x 0   Entered by:   Paulo Fruit  BS,CPht II,MPH   Authorized by:   Yisroel Ramming MD   Signed by:   Paulo Fruit  BS,CPht II,MPH on 05/28/2009   Method used:   Samples Given   RxID:   [id number] ISENTRESS 400 MG TABS (RALTEGRAVIR POTASSIUM) Take 1 tablet by mouth two times a day  #60 x 0   Entered by:   Paulo Fruit  BS,CPht II,MPH   Authorized by:   Yisroel Ramming MD   Signed by:   Paulo Fruit  BS,CPht II,MPH on 05/28/2009   Method used:   Samples Given   RxID:   [id number] TRUVADA 200-300 MG  TABS (EMTRICITABINE-TENOFOVIR) Take 1 tablet by mouth once a day  #30 x 0   Entered by:   Paulo Fruit  BS,CPht II,MPH   Authorized by:   Yisroel Ramming MD   Signed by:   Paulo Fruit  BS,CPht II,MPH on 05/28/2009   Method used:   Samples Given   RxID:   [id number]  Patient Assist Medication Verification: Medication name: SMZ-TMP DS 800/160mg  RX # [id number] Tech approval:MLD   Patient Assist Medication Verification: Medication:Truvada Lot# [id number] Exp Date:07 2014 Tech approval:MLD                Patient Assist Medication Verification: Medication:Isentress 400mg  RKY#[id number] Exp Date:02 2013 Tech approval:MLD Call placed to patient with message that assistance medications are ready for pick-up. Left message on VM Paulo Fruit  BS,CPht II,MPH  May 28, 2009 4:41 PM

## 2010-05-27 NOTE — Progress Notes (Signed)
Summary: Medication?  Phone Note Outgoing Call   Caller: Patient Summary of Call: Patient called and left message about medication.  She would like for someone to call her at [phone]. Initial call taken by: Paulo Fruit  BS,CPht II,MPH,  October 18, 2009 10:49 AM Call placed by: Paulo Fruit  BS,CPht II,MPH,  October 18, 2009 10:49 AM Call placed to: Patient Summary of Call: I called the number patient left and the number will not go through either because she has her phone turned off or it is out-of-range.  However, she has at least 3 months of medication here that has yet to be picked up.  We will be closing early today, but I will continue to try and call her to let her know that it is here. Initial call taken by: Paulo Fruit  BS,CPht II,MPH,  October 18, 2009 10:50 AM     Appended Document: Medication? Patient's Lotrisone cream has also been reordered. Pending arrival to our office.  Appended Document: Medication? I was told this morning that patient came after we closed for the day on Friday to pick up her medication.

## 2010-05-29 NOTE — Assessment & Plan Note (Signed)
Summary: F/U [MKJ]   Primary Provider:  [name]  CC:  follow-up visit, lab results, c/o rash on chest and face, chest pain and hot flashes, and needs PAP.  History of Present Illness: patient doing well.  She's been taking her medications every day.  She complains of a pruritic rash on her chest and face.    Depression History:      The patient denies a depressed mood most of the day and a diminished interest in her usual daily activities.        The patient denies that she feels like life is not worth living, denies that she wishes that she were dead, and denies that she has thought about ending her life.        Preventive Screening-Counseling & Management  Alcohol-Tobacco     Alcohol drinks/day: 0     Smoking Status: current     Smoking Cessation Counseling: yes     Packs/Day: 0.5     Passive Smoke Exposure: yes  Caffeine-Diet-Exercise     Caffeine use/day: 2     Does Patient Exercise: no  Hep-HIV-STD-Contraception     HIV Risk: no risk noted  Safety-Violence-Falls     Seat Belt Use: 100      Sexual History:  currently monogamous.        Drug Use:  never.        Blood Transfusions:  no.        Travel History:  none.    Comments: pt. declined condoms   Updated Prior Medication List: BACTRIM DS 800-160 MG TABS (SULFAMETHOXAZOLE-TRIMETHOPRIM) .qdtab TRUVADA 200-300 MG  TABS (EMTRICITABINE-TENOFOVIR) Take 1 tablet by mouth once a day MEGACE ES 625 MG/5ML SUSP (MEGESTROL ACETATE) 5ml by mough once a day LOTRISONE 1-0.05 % CREA (CLOTRIMAZOLE-BETAMETHASONE) apply to affect area two times a day ZOLOFT 50 MG TABS (SERTRALINE HCL) Take 1 tablet by mouth once a day POTASSIUM CHLORIDE 20 MEQ PACK (POTASSIUM CHLORIDE) take one tablet one time a day PREZISTA 400 MG TABS (DARUNAVIR ETHANOLATE) Take 2 tablets by mouth once a day NORVIR 100 MG TABS (RITONAVIR) Take 1 tablet by mouth once a day AZITHROMYCIN 600 MG TABS (AZITHROMYCIN) take 2 tablets once a  week  Current Allergies (reviewed today): No known allergies  Past History:  Past Medical History: Last updated: 05/26/2006 HIV disease  Review of Systems  The patient denies anorexia, fever, and weight loss.    Vital Signs:  Patient profile:   25 year old female Menstrual status:  regular Height:      63 inches (160.02 cm) Weight:      101.8 pounds (46.27 kg) BMI:     18.10 Temp:     99.0 degrees F (37.22 degrees C) Pulse rate:   71 / minute BP sitting:   128 / 70  (left arm)  Vitals Entered By: [name] CMA [name]) (May 14, 2010 2:04 PM) CC: follow-up visit, lab results, c/o rash on chest and face, chest pain and hot flashes, needs PAP Is Patient Diabetic? No Pain Assessment Patient in pain? yes     Location: chest Intensity: 8 Type: aching Onset of pain  Intermittent Nutritional Status BMI of < 19 = underweight Nutritional Status Detail appetite "good"  Have you ever been in a relationship where you felt threatened, hurt or afraid?No   Does patient need assistance? Functional Status Self care Ambulation Normal Comments no missed doses of meds per pt.   Physical Exam  General:  alert, well-developed, well-nourished, and well-hydrated.   Head:  normocephalic and atraumatic.   Mouth:  pharynx pink and moist.   Lungs:  normal breath sounds.     Impression & Recommendations:  Problem # 1:  HIV DISEASE (ICD-042) Pt.s most recent CD4ct was 140 and VL 48 .  Pt instructed to continue the current antiretroviral regimen.  Pt encouraged to take medication regularly and not miss doses.  Pt will f/u in 3 months for repeat blood work and will see me 2 weeks later.  Her updated medication list for this problem includes:    Bactrim Ds 800-160 Mg Tabs (Sulfamethoxazole-trimethoprim) ..... Marland Kitchenqdtab    Azithromycin 600 Mg Tabs (Azithromycin) .Marland Kitchen... Take 2 tablets once a week  Diagnostics Reviewed:  HIV: CDC-defined AIDS (05/01/2009)   CD4: 140 (04/30/2010)    WBC: 4.3 (04/29/2010)   Hgb: 13.9 (04/29/2010)   HCT: 41.2 (04/29/2010)   Platelets: 283 (04/29/2010) HIV genotype: See Comment (11/18/2009)   HIV-1 RNA: 145 (04/29/2010)   HBSAg: No (06/21/2006)  Other Orders: Hepatitis B Vaccine >75yrs (04540) Admin 1st Vaccine (98119) Est. Patient Level III (14782) Future Orders: T-CD4SP ([facility]) (CD4SP) ... 08/12/2010 T-HIV Viral Load [phone]) ... 08/12/2010 T-Comprehensive Metabolic Panel [id number]) ... 08/12/2010 T-CBC w/Diff ([id number]) ... 08/12/2010  Patient Instructions: 1)  Please schedule a follow-up appointment in 3 months, 2 weeks after labs .  Prescriptions: LOTRISONE 1-0.05 % CREA (CLOTRIMAZOLE-BETAMETHASONE) apply to affect area two times a day  #3 tubes x 0   Entered and Authorized by:   [name]   Signed by:   [name] on 05/14/2010   Method used:   Print then Give to Patient   RxID:   [id number]       Immunizations Administered:  Hepatitis B Vaccine # 2:    Vaccine Type: HepB Adult    Site: right deltoid    Mfr: Merck    Dose: 0.5 ml    Route: IM    Given by: [name] CMA ( AAMA)    Exp. Date: 04/14/2012    Lot #: [id number]    VIS given: 11/11/05 version given May 14, 2010.

## 2010-05-29 NOTE — Progress Notes (Addendum)
Summary: ADAP meds arrived, pt notified  Prescriptions: NORVIR 100 MG TABS (RITONAVIR) Take 1 tablet by mouth once a day  #30 x 0   Entered by:   Jimmy Footman, CMA   Authorized by:   Yisroel Ramming MD   Signed by:   Jimmy Footman, CMA on 04/14/2010   Method used:   Samples Given   RxID:   [id number] PREZISTA 400 MG TABS (DARUNAVIR ETHANOLATE) Take 2 tablets by mouth once a day  #60 x 0   Entered by:   Jimmy Footman, CMA   Authorized by:   Yisroel Ramming MD   Signed by:   Jimmy Footman, CMA on 04/14/2010   Method used:   Samples Given   RxID:   [id number] TRUVADA 200-300 MG  TABS (EMTRICITABINE-TENOFOVIR) Take 1 tablet by mouth once a day  #30 x 0   Entered by:   Jimmy Footman, CMA   Authorized by:   Yisroel Ramming MD   Signed by:   Jimmy Footman, CMA on 04/14/2010   Method used:   Samples Given   RxID:   [id number] BACTRIM DS 800-160 MG TABS (SULFAMETHOXAZOLE-TRIMETHOPRIM) .qdtab  #30 x 0   Entered by:   Jimmy Footman, CMA   Authorized by:   Yisroel Ramming MD   Signed by:   Jimmy Footman, CMA on 04/14/2010   Method used:   Samples Given   RxID:   [id number] AZITHROMYCIN 600 MG TABS (AZITHROMYCIN) take 2 tablets once a week  #8 x 0   Entered by:   Jimmy Footman, CMA   Authorized by:   Yisroel Ramming MD   Signed by:   Jimmy Footman, CMA on 04/14/2010   Method used:   Samples Given   RxID:   [id number]   Appended Document: ADAP meds arrived, pt notified pt. picked up ADAP meds

## 2010-05-29 NOTE — Miscellaneous (Signed)
Summary: [facility] [employer] Update   Clinical Lists Changes  Observations: Added new observation of AIDSDAP: Pending approval 2012 (05/16/2010 9:34) Added new observation of PCTFPL: no (05/16/2010 9:34) Added new observation of INCOMESOURCE: none (05/16/2010 9:34) Added new observation of HOUSEINCOME: 0  (05/16/2010 9:34) Added new observation of FINASSESSDT: 05/15/2010  (05/16/2010 9:34)

## 2010-06-09 ENCOUNTER — Encounter (INDEPENDENT_AMBULATORY_CARE_PROVIDER_SITE_OTHER): Payer: Self-pay | Admitting: *Deleted

## 2010-06-18 NOTE — Miscellaneous (Signed)
Summary: ADAP approved til 01/25/11  Clinical Lists Changes  Observations: Added new observation of AIDSDAP: Yes 2012 (06/09/2010 10:55)

## 2010-07-07 LAB — DIFFERENTIAL
Basophils Absolute: 0 10*3/uL (ref 0.0–0.1)
Basophils Relative: 0 % (ref 0–1)
Eosinophils Absolute: 0.2 10*3/uL (ref 0.0–0.7)
Eosinophils Relative: 4 % (ref 0–5)
Lymphocytes Relative: 28 % (ref 12–46)
Monocytes Absolute: 0.5 10*3/uL (ref 0.1–1.0)

## 2010-07-07 LAB — URINE CULTURE
Colony Count: NO GROWTH
Culture  Setup Time: 201112111157

## 2010-07-07 LAB — CBC
HCT: 38.2 % (ref 36.0–46.0)
MCH: 32.1 pg (ref 26.0–34.0)
MCHC: 34.8 g/dL (ref 30.0–36.0)
MCV: 92.3 fL (ref 78.0–100.0)
Platelets: 214 10*3/uL (ref 150–400)
RDW: 13.9 % (ref 11.5–15.5)
WBC: 5.3 10*3/uL (ref 4.0–10.5)

## 2010-07-07 LAB — URINALYSIS, ROUTINE W REFLEX MICROSCOPIC
Glucose, UA: NEGATIVE mg/dL
Nitrite: NEGATIVE
Protein, ur: NEGATIVE mg/dL
Urobilinogen, UA: 0.2 mg/dL (ref 0.0–1.0)

## 2010-07-07 LAB — POCT I-STAT, CHEM 8
BUN: 7 mg/dL (ref 6–23)
Calcium, Ion: 1.09 mmol/L — ABNORMAL LOW (ref 1.12–1.32)
Hemoglobin: 13.9 g/dL (ref 12.0–15.0)
Sodium: 143 mEq/L (ref 135–145)
TCO2: 24 mmol/L (ref 0–100)

## 2010-07-07 LAB — URINE MICROSCOPIC-ADD ON

## 2010-07-07 LAB — POCT CARDIAC MARKERS: Myoglobin, poc: 89.6 ng/mL (ref 12–200)

## 2010-07-07 LAB — T-HELPER CELL (CD4) - (RCID CLINIC ONLY)
CD4 % Helper T Cell: 8 % — ABNORMAL LOW (ref 33–55)
CD4 T Cell Abs: 140 uL — ABNORMAL LOW (ref 400–2700)

## 2010-07-09 LAB — T-HELPER CELL (CD4) - (RCID CLINIC ONLY)
CD4 % Helper T Cell: 5 % — ABNORMAL LOW (ref 33–55)
CD4 T Cell Abs: 40 uL — ABNORMAL LOW (ref 400–2700)

## 2010-07-12 LAB — T-HELPER CELL (CD4) - (RCID CLINIC ONLY): CD4 % Helper T Cell: 6 % — ABNORMAL LOW (ref 33–55)

## 2010-07-14 LAB — T-HELPER CELL (CD4) - (RCID CLINIC ONLY)
CD4 % Helper T Cell: 7 % — ABNORMAL LOW (ref 33–55)
CD4 T Cell Abs: 60 uL — ABNORMAL LOW (ref 400–2700)

## 2010-07-29 LAB — T-HELPER CELL (CD4) - (RCID CLINIC ONLY): CD4 % Helper T Cell: 9 % — ABNORMAL LOW (ref 33–55)

## 2010-08-03 LAB — CBC
HCT: 37.8 % (ref 36.0–46.0)
Hemoglobin: 12.7 g/dL (ref 12.0–15.0)
MCHC: 33.5 g/dL (ref 30.0–36.0)
MCV: 93.5 fL (ref 78.0–100.0)
RDW: 14.3 % (ref 11.5–15.5)

## 2010-08-03 LAB — URINALYSIS, ROUTINE W REFLEX MICROSCOPIC
Nitrite: NEGATIVE
Specific Gravity, Urine: 1.02 (ref 1.005–1.030)
Urobilinogen, UA: 0.2 mg/dL (ref 0.0–1.0)
pH: 6 (ref 5.0–8.0)

## 2010-08-03 LAB — COMPREHENSIVE METABOLIC PANEL
Alkaline Phosphatase: 41 U/L (ref 39–117)
BUN: 8 mg/dL (ref 6–23)
Calcium: 9.3 mg/dL (ref 8.4–10.5)
Creatinine, Ser: 0.57 mg/dL (ref 0.4–1.2)
Glucose, Bld: 90 mg/dL (ref 70–99)
Total Protein: 7.5 g/dL (ref 6.0–8.3)

## 2010-08-03 LAB — T-HELPER CELL (CD4) - (RCID CLINIC ONLY): CD4 % Helper T Cell: 12 % — ABNORMAL LOW (ref 33–55)

## 2010-08-03 LAB — WET PREP, GENITAL: Yeast Wet Prep HPF POC: NONE SEEN

## 2010-08-03 LAB — GC/CHLAMYDIA PROBE AMP, GENITAL
Chlamydia, DNA Probe: NEGATIVE
GC Probe Amp, Genital: NEGATIVE

## 2010-08-03 LAB — POCT PREGNANCY, URINE: Preg Test, Ur: NEGATIVE

## 2010-08-04 LAB — URINALYSIS, ROUTINE W REFLEX MICROSCOPIC
Ketones, ur: 15 mg/dL — AB
Nitrite: NEGATIVE
Specific Gravity, Urine: 1.026 (ref 1.005–1.030)
pH: 6 (ref 5.0–8.0)

## 2010-08-04 LAB — POCT PREGNANCY, URINE: Preg Test, Ur: NEGATIVE

## 2010-08-23 ENCOUNTER — Emergency Department (HOSPITAL_COMMUNITY): Payer: Self-pay

## 2010-08-23 ENCOUNTER — Emergency Department (HOSPITAL_COMMUNITY)
Admission: EM | Admit: 2010-08-23 | Discharge: 2010-08-23 | Disposition: A | Discharge status: Home / Self Care | Payer: Self-pay | Attending: Emergency Medicine | Admitting: Emergency Medicine

## 2010-08-23 DIAGNOSIS — R059 Cough, unspecified: Secondary | ICD-10-CM | POA: Insufficient documentation

## 2010-08-23 DIAGNOSIS — R350 Frequency of micturition: Secondary | ICD-10-CM | POA: Insufficient documentation

## 2010-08-23 DIAGNOSIS — R0602 Shortness of breath: Secondary | ICD-10-CM | POA: Insufficient documentation

## 2010-08-23 DIAGNOSIS — J3489 Other specified disorders of nose and nasal sinuses: Secondary | ICD-10-CM | POA: Insufficient documentation

## 2010-08-23 DIAGNOSIS — J40 Bronchitis, not specified as acute or chronic: Secondary | ICD-10-CM | POA: Insufficient documentation

## 2010-08-23 DIAGNOSIS — N390 Urinary tract infection, site not specified: Secondary | ICD-10-CM | POA: Insufficient documentation

## 2010-08-23 DIAGNOSIS — R071 Chest pain on breathing: Secondary | ICD-10-CM | POA: Insufficient documentation

## 2010-08-23 DIAGNOSIS — Z21 Asymptomatic human immunodeficiency virus [HIV] infection status: Secondary | ICD-10-CM | POA: Insufficient documentation

## 2010-08-23 DIAGNOSIS — R112 Nausea with vomiting, unspecified: Secondary | ICD-10-CM | POA: Insufficient documentation

## 2010-08-23 DIAGNOSIS — M940 Chondrocostal junction syndrome [Tietze]: Secondary | ICD-10-CM | POA: Insufficient documentation

## 2010-08-23 DIAGNOSIS — R6883 Chills (without fever): Secondary | ICD-10-CM | POA: Insufficient documentation

## 2010-08-23 DIAGNOSIS — R05 Cough: Secondary | ICD-10-CM | POA: Insufficient documentation

## 2010-08-23 LAB — CBC
HCT: 39.8 % (ref 36.0–46.0)
Hemoglobin: 14.4 g/dL (ref 12.0–15.0)
RBC: 4.19 MIL/uL (ref 3.87–5.11)
WBC: 5.2 10*3/uL (ref 4.0–10.5)

## 2010-08-23 LAB — URINALYSIS, ROUTINE W REFLEX MICROSCOPIC
Bilirubin Urine: NEGATIVE
Glucose, UA: NEGATIVE mg/dL
Ketones, ur: NEGATIVE mg/dL
Nitrite: NEGATIVE
pH: 6 (ref 5.0–8.0)

## 2010-08-23 LAB — DIFFERENTIAL
Basophils Absolute: 0 10*3/uL (ref 0.0–0.1)
Lymphocytes Relative: 39 % (ref 12–46)
Neutro Abs: 2.5 10*3/uL (ref 1.7–7.7)

## 2010-08-23 LAB — URINE MICROSCOPIC-ADD ON

## 2010-08-23 LAB — BASIC METABOLIC PANEL
CO2: 23 mEq/L (ref 19–32)
Glucose, Bld: 85 mg/dL (ref 70–99)
Potassium: 3.9 mEq/L (ref 3.5–5.1)
Sodium: 133 mEq/L — ABNORMAL LOW (ref 135–145)

## 2010-08-23 LAB — POCT PREGNANCY, URINE: Preg Test, Ur: NEGATIVE

## 2010-08-25 LAB — URINE CULTURE

## 2010-08-28 ENCOUNTER — Encounter: Payer: Self-pay | Admitting: Adult Health

## 2010-08-28 ENCOUNTER — Ambulatory Visit (INDEPENDENT_AMBULATORY_CARE_PROVIDER_SITE_OTHER): Payer: Self-pay | Admitting: Adult Health

## 2010-08-28 DIAGNOSIS — J45909 Unspecified asthma, uncomplicated: Secondary | ICD-10-CM

## 2010-08-28 DIAGNOSIS — E876 Hypokalemia: Secondary | ICD-10-CM

## 2010-08-28 DIAGNOSIS — B20 Human immunodeficiency virus [HIV] disease: Secondary | ICD-10-CM

## 2010-08-28 DIAGNOSIS — L259 Unspecified contact dermatitis, unspecified cause: Secondary | ICD-10-CM

## 2010-08-28 DIAGNOSIS — R64 Cachexia: Secondary | ICD-10-CM

## 2010-08-28 DIAGNOSIS — F329 Major depressive disorder, single episode, unspecified: Secondary | ICD-10-CM

## 2010-08-28 LAB — CBC WITH DIFFERENTIAL/PLATELET
Eosinophils Absolute: 0.1 10*3/uL (ref 0.0–0.7)
Eosinophils Relative: 2 % (ref 0–5)
HCT: 41.3 % (ref 36.0–46.0)
Hemoglobin: 14.5 g/dL (ref 12.0–15.0)
Lymphs Abs: 1.9 10*3/uL (ref 0.7–4.0)
MCH: 34 pg (ref 26.0–34.0)
MCV: 96.9 fL (ref 78.0–100.0)
Monocytes Absolute: 0.3 10*3/uL (ref 0.1–1.0)
Monocytes Relative: 7 % (ref 3–12)
Neutrophils Relative %: 48 % (ref 43–77)
RBC: 4.26 MIL/uL (ref 3.87–5.11)

## 2010-08-28 MED ORDER — ALBUTEROL 90 MCG/ACT IN AERS: 2.0000 | INHALATION_SPRAY | Freq: Four times a day (QID) | RESPIRATORY_TRACT | Status: DC | PRN

## 2010-08-28 NOTE — Progress Notes (Signed)
Subjective:    Patient ID: [name], female    DOB: [birth year], 25 y.o.   MRN: [id number]  HPI presents to clinic for followup after being seen in the emergency room on 08/21/2010, when she was treated for a URI and a UTI. She states they placed her on an antibiotic for UTI, but she's not certain the name of the medication. She did not bring in with her. She states she still going to the bathroom "frequently" but her dysuria is much improved. She still relates also he has a chronic cough with some chest tightness for which he was given an albuterol MDI. She is requesting that he may have an additional MDI as her symptoms are not yet resolved. She denies any fever, shortness of breath, dyspnea on exertion or productive sputum. Also, she is due for labs today. She also relates that she has not had a Pap smear in "a bit". She also has complaints of nausea since she has been on her antibiotics, but denies any vomiting.    Review of Systems  Constitutional: Positive for chills, appetite change and fatigue. Negative for fever, diaphoresis, activity change and unexpected weight change.  HENT: Negative.   Eyes: Negative.   Respiratory: Positive for cough, chest tightness and wheezing. Negative for shortness of breath and stridor.   Cardiovascular: Negative.   Gastrointestinal: Positive for nausea. Negative for vomiting, abdominal pain and diarrhea.  Genitourinary: Positive for urgency, frequency, hematuria and genital sores. Negative for dysuria, vaginal bleeding, vaginal discharge, vaginal pain and pelvic pain.  Musculoskeletal: Negative.   Skin: Negative.   Neurological: Negative.   Hematological: Negative.   Psychiatric/Behavioral: Negative.        Objective:   Physical Exam  Constitutional: She is oriented to person, place, and time. She appears well-developed.       Underweight-appearing.  HENT:  Head: Normocephalic and atraumatic.  Right Ear: External ear normal.  Left Ear:  External ear normal.  Mouth/Throat: Oropharynx is clear and moist.  Eyes: Conjunctivae are normal. Pupils are equal, round, and reactive to light.  Neck: Normal range of motion. Neck supple. No thyromegaly present.  Cardiovascular: Normal rate, regular rhythm, normal heart sounds and intact distal pulses.   Pulmonary/Chest: Effort normal and breath sounds normal. She has no wheezes. She exhibits no tenderness.  Abdominal: Soft. Bowel sounds are normal. She exhibits no distension and no mass. There is no tenderness. There is no rebound and no guarding.  Musculoskeletal: Normal range of motion.  Neurological: She is alert and oriented to person, place, and time. No cranial nerve deficit.  Skin: Skin is warm and dry. No rash noted.  Psychiatric: She has a normal mood and affect. Her behavior is normal. Judgment and thought content normal.          Assessment & Plan:  1. UTI. She is instructed to continue taking her medication, but she needs to contact the clinic to let us know what the medication is that she is taking. She verbally acknowledged this and will contact the clinic when she has an. Her medication available.  2. URI. Seems to be resolving with no clinical signs on physical examination. However, subjectively, she still complains of chest tightness, and some wheezing. We will give her 1 albuterol MDI inhaler with no refills for use only if she needs it.  3. Nausea. Most likely associated with both combination of her medications and her new antibiotic. Recommended a mild, fat-free, bland diet. And also recommended that she  take her antibiotics and other medications with food. If the symptoms continue. She is content to clinic and we will order something for her nausea.  4. HIV. From January 2012, her CD4 count was 140 at 80% with a viral load of 145 copies per mL. She has not had any further evaluations since that time. We will repeat staging labs today and ask her to come back and see  [place] in 2 weeks to review labs and continue planning. Her care. At that time she should have completed her treatment for the UTI and we will make a referral for her to have a Pap smear with Ms. Lennox Laity. She verbally analysis information and agreed with plan of care.

## 2010-08-29 LAB — COMPREHENSIVE METABOLIC PANEL
BUN: 4 mg/dL — ABNORMAL LOW (ref 6–23)
CO2: 21 mEq/L (ref 19–32)
Glucose, Bld: 85 mg/dL (ref 70–99)
Sodium: 140 mEq/L (ref 135–145)
Total Bilirubin: 0.3 mg/dL (ref 0.3–1.2)
Total Protein: 8.4 g/dL — ABNORMAL HIGH (ref 6.0–8.3)

## 2010-08-29 LAB — T-HELPER CELL (CD4) - (RCID CLINIC ONLY): CD4 T Cell Abs: 260 uL — ABNORMAL LOW (ref 400–2700)

## 2010-08-31 LAB — HIV-1 RNA QUANT-NO REFLEX-BLD
HIV 1 RNA Quant: 169 copies/mL — ABNORMAL HIGH (ref <20)
HIV-1 RNA Quant, Log: 2.23 {Log} — ABNORMAL HIGH (ref <1.30)

## 2010-09-09 NOTE — Discharge Summary (Signed)
NAMEMarland Kitchen  Allison, Whitehead NO.:  [id number]   MEDICAL RECORD NO.:  [id number]          PATIENT TYPE:  WOC   LOCATION:  WOC                          FACILITY:  [facility]   PHYSICIAN:  Madaline Guthrie, M.D.    DATE OF BIRTH:  10/04/1985   DATE OF ADMISSION:  DATE OF DISCHARGE:                               DISCHARGE SUMMARY   DISCHARGE DIAGNOSES:  1. Neutropenia.  2. Oral esophageal candida infection.  3. Human immunodeficiency virus.  4. Hypotension.  5. Fever, resolved.  6. Cough, resolved.   DISCHARGE MEDICATIONS:  1. Vitamin B12 1 tablet per day.  2. Folic acid 1 tablet per day.  3. Combivir 1 tablet twice a day.  4. Kaletra 2 tablets twice a day.  5. Fluconazole 100 mg, take every other day for 1 week, then take once      a week.   She was advised on discharge to yellow mask over her mouth and nose at  all times until she sees Dr. Philipp Deputy.  She was advised not to eat raw  fruits, vegetables, or nuts, and not to be near anyone who is sick.   She will follow up with Dr. Philipp Deputy on November 12, 2006 at 2:15 p.m.  At  that time, she needs a CBC.   PROCEDURES PERFORMED DURING THIS ADMISSION:  Chest x-ray performed on  October 28, 2006 which showed stable, coarse bronchial locular markings  without acute or superimposed abnormality.    Dictation ended at this point.     Elby Showers, MD  Electronically Signed      Madaline Guthrie, M.D.  Electronically Signed   CW/MEDQ  D:  11/17/2006  T:  11/18/2006  Job:  [id number]   cc:   Tresa Endo L. Philipp Deputy, M.D.

## 2010-09-09 NOTE — Discharge Summary (Signed)
[name], COONROD NO.:  [id number]   MEDICAL RECORD NO.:  [id number]          PATIENT TYPE:  INP   LOCATION:  5007                         FACILITY:  [facility]   PHYSICIAN:  Edsel Petrin, D.O.DATE OF BIRTH:  August 13, 1985   DATE OF ADMISSION:  09/17/2006  DATE OF DISCHARGE:  09/19/2006                               DISCHARGE SUMMARY   DISCHARGE DIAGNOSES:  1. Fever with human immunodeficiency virus infection.  2. Leukopenia.  3. Anemia.  4. Shingles, in remission.  5. Hypokalemia.  6. Submandibular lymphadenopathy, secondary to throat infection.  7. Oral thrush.   DISCHARGE MEDICATIONS:  1. Kaletra 200/50 mg, 2 tablets b.i.d.  2. Combivir 150/300 mg, 1 tablet b.i.d.  3. Azithromycin 600 mg weekly.  4. Bactrim 1 tablet on Monday, Wednesday, Friday.  5. Diflucan 100-mg tab p.o. daily.   DISPOSITION:  [name] is discharged in stable and improved condition.  She has a followup appoint with Dr. Philipp Deputy in the infectious disease  clinic in 1 week.  At the time of followup, she will need to be  evaluated for resolution of her fever and associated symptoms.   BRIEF ADMITTING HISTORY AND PHYSICAL:  [name] is a 25 year old  African-American female with a past medical history significant for HIV  infection with a CD-4 count of less than 9 in January 2008 and a recent  diagnosis of shingles who presented to the hospital with a 1-day history  of fever of 103 degrees Fahrenheit.  She denied any cough, productive  sputum, urinary symptoms or headache.  She denied any photophobia or  neck stiffness.  There was no history of general malaise or nasal  congestion.  She does have a history of sick contacts at home around  children with upper respiratory infections.  She does complain of a sore  throat for the past 2-3 days.   Admission vital signs:  Blood pressure 97/64, heart rate 100,  temperature 100.8, respiratory rate 20, O2 saturation 95% on room air.  Pertinent exam findings:  Her oropharynx was erythematous with the  presence of oral thrush.  Left arm findings were consistent with  shingles in multiple stages of healing.  She had eczema on her right  forearm.  There was no neck rigidity.  Kernig's negative.  Otherwise  normal exam findings.  She did have some shotty lymphadenopathy in her  neck and submandibular region.   Admission laboratories showed leukopenia with a WBC of 1.6, hemoglobin  9.8.  Sodium 134, potassium 3.1, chloride 100, bicarb 27, BUN 8,  creatinine 0.5, glucose 67.  Liver function tests were normal.  Urine  strep negative.  Urine pregnancy negative.  Chest x-ray showed no acute  findings.  Peripheral smear showed large platelets and toxic  granulations.  There was a left shift.   HOSPITAL COURSE BY PROBLEM:  1. Fever of unknown etiology:  The patient was admitted, given her HIV      status, low CD-4 count and fever.  A large differential included      workup for bacterial infection, syphilis, gonorrhea, Staphylococcus  aureus infection, mycobacterial infection, Cryptococcus, herpes      zoster reactivation and drug reaction versus lymphoma and oral      thrush.  Based on her exam, her only clinical finding was thrush      with some throat erythema.  However, we completed a workup for PCP      pneumonia, CMV, Cryptococcus and bacterial pneumonia.  Her CD-4      count was also repeated.  All of these tests were not revealing as      to the source of her infection and cause of fever.  It is likely      that this is a viral URI or throat infection.  Her leukopenia is      likely due to her advanced HIV disease.  2. Anemia, microcytic.  Iron panel does indicate iron deficiency      anemia.  This is likely due to menstruation.  3. Shingles, in remission.  There were no active lesions. The patient      completed treatment with Valtrex in March.  4. Hypokalemia.  We repleted her potassium and magnesium.  5.  Submandibular lymphadenopathy.  This is likely secondary to her      throat infection and should be followed closely after discharge.      Her oral thrush was treated with Diflucan 100 mg p.o. daily.  Strep      throat cultures were negative.   DISCHARGE LABORATORIES:  WBC 1.6, hemoglobin 9.8, platelets 271.  Sodium  140, potassium 4.3, chloride 106, bicarb 28, BUN 9, creatinine 0.5,  glucose 80.  LFTs normal.  CT of the head was normal.  RPR negative.  Strep negative.  Blood cultures were pending at the time of discharge.      Edsel Petrin, D.O.  Electronically Signed     ELG/MEDQ  D:  10/21/2006  T:  10/21/2006  Job:  [id number]   cc:   Manning Charity, MD

## 2010-09-09 NOTE — Discharge Summary (Signed)
[name], [name] NO.:  [id number]   MEDICAL RECORD NO.:  [id number]          PATIENT TYPE:  INP   LOCATION:  5114                         FACILITY:  [facility]   PHYSICIAN:  Elby Showers, MD    DATE OF BIRTH:  11/22/1985   DATE OF ADMISSION:  10/28/2006  DATE OF DISCHARGE:  11/05/2006                               DISCHARGE SUMMARY   DISCHARGE DIAGNOSES:  Include:  1. Neutropenia.  2. Oral esophageal Candida infection.  3. HIV.  4. Hypotension.  5. Fever, resolved.  6. Cough, resolved.   DISCHARGE MEDICATIONS:  1. Vitamin B12 one tablet per day.  2. Folic acid 1 tablet per day.  3. Combivent 1 tablet twice Whitehead day.  4. Kaletra 2 tablets twice Whitehead day.  5. Fluconazole 100 mg take 1 tablet every other day for 1 week, and      then take 1 tablet per week.  She was advised on discharge to wear      Whitehead yellow mask over her mouth and nose at all times until she sees      Dr. Philipp Deputy.  She is advised not to eat raw fruits or vegetables or      nuts, and not to make contact with anyone who is sick.  She will be      following up with Dr. Philipp Deputy in the Infectious Disease clinic on      November 12, 2006, at 2:15 p.m.  At that time Whitehead CBC with diff needs to      be checked.   PROCEDURES PERFORMED DURING HOSPITALIZATION:  1. Chest x-ray on the day of admission which showed clear lungs with      stable chronic bronchitic change.  No acute abnormalities.  2. Chest x-ray performed on October 30, 2006 which showed stable      bronchovascular markings.  No acute or superimposed abnormalities.   CONSULTATIONS:  There were no consultations during this admission.   BRIEF ADMITTING HISTORY AND PHYSICAL:  [name] 25 year old  African American female with past medical history significant for HIV  diagnosed 1 year prior to admission, with CD-4 count that was 9, with Whitehead  viral load of 301,000, performed in January of 2008.   PAST MEDICAL HISTORY:  Shingles, for which she is  taking acyclovir.  She  has been noncompliant to follow up in the Infectious Disease Clinic, and  has not been compliant with HIV medications because they make her  crazy.  For the past 2 days before admission she had been having fevers  up to 102 degrees.  Her only complaint is difficulty swallowing.  She  denies headache, sore throat, chest pain, abdominal pain, dysuria.  She  has an occasional nonproductive cough.  She denies nausea or vomiting,  diarrhea, though she did have 1 episode of emesis in the ED.  On the day  of admission she had Whitehead temperature of 102.  Blood pressure 117/79.  Pulse 98.  Respirations 20.  She was satting 100% on room air.   PHYSICAL EXAMINATION:  Significant only for  occipital cervical  submandibular lymphadenopathy.  LUNGS:  Were clear.  HEART:  Was regular rate and rhythm.   ADMITTING LABORATORIES:  Include sodium 138, potassium 2.9, chloride  103, bicarb 29, BUN 10, creatinine 0.8, glucose 98.  White blood cell  count 2, hematocrit 9.4, absolute neutrophil count 1.4.  Urine pregnancy  test was negative.  Urine protein 100, with 3 to 6 white blood cells, 0  to 2 red blood cells, rare bacteria, positive for hyaline casts.   HOSPITAL COURSE:  1. Neutropenia.  On day 2 post admission October 31, 2006, she had Whitehead      critical value.  White blood cell count was 0.8, though I was      unable to do Whitehead differential because the number was so low.  Thus      neutropenia may be secondary to fluconazole which was given for her      oral and esophageal Candida.  At that time fluconazole was      discontinued, and she was started on caspofungin, and her white      blood cell count was, until the day of admission, it was back to      1.2.  She was discharged on fluconazole because caspofungin was      available only IV.  She was advised to take it every other day for      Whitehead week, and then once Whitehead week afterward.  2. Oral esophageal Candida.  On presentation the patient  complained of      oral pain, and pain with swallowing.  By the time of discharge  she      no longer had pain with swallowing.  She was tolerating food and      drink easily.  No longer complaining of oral pain and visible      candidal plaques.  Oral mucosa were greatly reduced in size and      intensity.  3. HIV.  While in the hospital, patient was put back on HIV      medications, her heart therapy, and she was discharge with      prescriptions for all of these, and discharged with instructions to      follow up with Infectious Disease Clinic.  4. Fever.  Cause of the fever remains unknown.  Pertinent labs were      collected regarding fever, to investigate the cause.  These include      Rapid strep screen was negative, sputum culture which showed no      growth.  Sputum AFB smears and culture showing no AFBs, no growth.      Cryptococcal antigen which was negative.  C. Diff toxin negative.      Ova and parasites negative.  Pneumocystis smear negative.  No-      growth spiridia, spore stain negative.  Blood culture negative.      Blood culture for fungus negative.  5. Cough, resolved during the hospital stay.  Cough may have been      secondary to smoking.  Patient admits to smoking, and did not smoke      during her hospital stay because she was on neutropenic      precautions.  May also have been secondary to the esophageal      Candida.  6. Hypotension.  Patient seems to be chronically hypotensive, which      hypotension responsive to fluids.  Upon discontinuation of IV      fluids,  patient's blood pressure would fall, and cortisol was shown      to be within normal limits.   DISCHARGE LABORATORIES:  On the day of discharge patient's labs are as  follows:  Sodium 133, potassium 4.3, chloride 97, bicarb 28, BUN 7,  creatinine 0.5, glucose 76, white blood cell count 1.2, hemoglobin 10.0,  hematocrit 29.4, platelets 437.  Absolute neutrophil count was 0.2.  White blood cell count  includes 17% neutrophils, 30% lymphocytes, 22%  monocytes, and 31% eosinophils.  The differential may or may not be  reliable due to the very low number of white blood cells.   VITALS ON THE DAY OF DISCHARGE:  Temperature 97.5.  Blood pressure  85/55.  Pulse 75.  Respirations 20.  O2 sat 100% on room air.  It should  be noted that this patient left AMA.  She was still on neutropenic  precautions at  the time of her leaving the hospital against medical advice.  She was  advised to wear Whitehead yellow mask over her mouth and nose to avoid any  sources of infection.  Patient left expressing Whitehead strong desire to leave  her room, and expressing that she felt fine and wanted to go home.      Elby Showers, MD  Electronically Signed     CW/MEDQ  D:  11/17/2006  T:  11/18/2006  Job:  [id number]   cc:   Tresa Endo L. Philipp Deputy, M.D.

## 2010-09-11 ENCOUNTER — Ambulatory Visit (INDEPENDENT_AMBULATORY_CARE_PROVIDER_SITE_OTHER): Payer: Self-pay | Admitting: Adult Health

## 2010-09-11 VITALS — BP 133/84 | HR 67 | Temp 98.0°F | Ht 64.0 in | Wt 99.0 lb

## 2010-09-11 DIAGNOSIS — B20 Human immunodeficiency virus [HIV] disease: Secondary | ICD-10-CM

## 2010-09-11 DIAGNOSIS — K219 Gastro-esophageal reflux disease without esophagitis: Secondary | ICD-10-CM

## 2010-09-11 DIAGNOSIS — Z21 Asymptomatic human immunodeficiency virus [HIV] infection status: Secondary | ICD-10-CM

## 2010-09-11 MED ORDER — FAMOTIDINE 20 MG PO TABS: 20.0000 mg | ORAL_TABLET | Freq: Two times a day (BID) | ORAL | Status: DC

## 2010-09-11 NOTE — Progress Notes (Signed)
Subjective:    Patient ID: [name], female    DOB: Mar 22, 1986, 25 y.o.   MRN: [id number]  HPI Presents to clinic for followup. Endorses 100% adherence to medications with good tolerance and no complications. However, over the past month, she has been having epigastric pain, specifically, while she is sitting upright, but not while she is lying recumbent nor during bedtime. Denies any burning sensation or dysphagia but when she is having the pain after she eats she regurgitates her food. When the pain is not present. She is able to retain food on her stomach. She denies any pain at present, but has not been able to eat today.   Review of Systems  Constitutional: Positive for appetite change. Negative for fever, chills, diaphoresis, activity change, fatigue and unexpected weight change.  HENT: Negative.   Eyes: Negative.   Respiratory: Negative.   Cardiovascular: Negative.   Gastrointestinal: Positive for vomiting and abdominal pain. Negative for nausea, diarrhea, constipation, blood in stool, abdominal distention, anal bleeding and rectal pain.  Genitourinary: Negative.   Musculoskeletal: Negative.   Skin: Negative.   Neurological: Negative.   Hematological: Negative.   Psychiatric/Behavioral: Negative.        Objective:   Physical Exam  Constitutional: She is oriented to person, place, and time. She appears well-developed. No distress.       Underweight-appearing  HENT:  Head: Normocephalic and atraumatic.  Right Ear: External ear normal.  Left Ear: External ear normal.  Nose: Nose normal.  Mouth/Throat: Oropharynx is clear and moist.       No thrush in the oral mucosa  Eyes: Conjunctivae and EOM are normal. Pupils are equal, round, and reactive to light.  Neck: Normal range of motion. Neck supple.  Cardiovascular: Normal rate, regular rhythm, normal heart sounds and intact distal pulses.   Pulmonary/Chest: Effort normal and breath sounds normal.  Abdominal: Soft. Bowel  sounds are normal. She exhibits no distension and no mass. There is no tenderness. There is no rebound and no guarding.  Musculoskeletal: Normal range of motion.  Neurological: She is alert and oriented to person, place, and time. No cranial nerve deficit. She exhibits normal muscle tone. Coordination normal.  Skin: Skin is warm and dry.  Psychiatric: Her behavior is normal. Judgment and thought content normal.       Affect blunted          Assessment & Plan:  1. HIV. From labs obtained on 08/28/2010 CD4 count was 260 at 13%, which is a marked improvement from her previous evaluation. A viral load however, remains at 169 copies per mL, which doesn't seem to be getting any further than last assessment. We will continue to monitor this, but if no further drop in viral load is noted, we may want to consider treatment intensification. Additionally, her CD4 count remained above 100 cells per cubic millimeter, for over 6 months, so, we will discontinue her azithromycin for MAC prophylaxis. Otherwise, we will continue all other management for now. She is to return in 10 weeks for repeat labs and a followup in 3 months. She verbally acknowledged this and agreed with plan of care.  2. Abdominal Pain, with Postprandial Regurgitation. Findings, are peculiar and seem more consistent with either an esophagitis or structural abnormality, such as a hiatal hernia. However, given her size, and age, this seems much less likely. The finding of no oral thrush, and the improved. CD4 count that she has makes infectious esophagitis. Also, less likely. We will initially  treat her with H2 blocker therapy and see her response. We will be giving her famotidine 20 mg one by mouth twice a day and if symptoms persist after the next couple weeks, she needs to contact the clinic to arrange for reevaluation. She verbally acknowledged this and agreed with plan of care.

## 2010-10-24 ENCOUNTER — Telehealth: Payer: Self-pay | Admitting: *Deleted

## 2010-10-24 NOTE — Telephone Encounter (Signed)
Pt needs to call the Center to make pap smear appt.  Message left for pt.  Jennet Maduro, RN

## 2010-11-12 ENCOUNTER — Encounter (HOSPITAL_COMMUNITY): Payer: Self-pay

## 2010-11-12 ENCOUNTER — Inpatient Hospital Stay (HOSPITAL_COMMUNITY): Payer: Self-pay

## 2010-11-12 ENCOUNTER — Inpatient Hospital Stay (HOSPITAL_COMMUNITY)
Admission: AD | Admit: 2010-11-12 | Discharge: 2010-11-12 | Disposition: A | Discharge status: Home / Self Care | Payer: Self-pay | Source: Ambulatory Visit | Attending: Obstetrics and Gynecology | Admitting: Obstetrics and Gynecology

## 2010-11-12 DIAGNOSIS — O98819 Other maternal infectious and parasitic diseases complicating pregnancy, unspecified trimester: Secondary | ICD-10-CM | POA: Insufficient documentation

## 2010-11-12 DIAGNOSIS — Z21 Asymptomatic human immunodeficiency virus [HIV] infection status: Secondary | ICD-10-CM

## 2010-11-12 DIAGNOSIS — O98519 Other viral diseases complicating pregnancy, unspecified trimester: Secondary | ICD-10-CM | POA: Insufficient documentation

## 2010-11-12 DIAGNOSIS — O210 Mild hyperemesis gravidarum: Secondary | ICD-10-CM | POA: Insufficient documentation

## 2010-11-12 DIAGNOSIS — A5901 Trichomonal vulvovaginitis: Secondary | ICD-10-CM | POA: Insufficient documentation

## 2010-11-12 DIAGNOSIS — O26859 Spotting complicating pregnancy, unspecified trimester: Secondary | ICD-10-CM

## 2010-11-12 DIAGNOSIS — O219 Vomiting of pregnancy, unspecified: Secondary | ICD-10-CM

## 2010-11-12 DIAGNOSIS — Z3687 Encounter for antenatal screening for uncertain dates: Secondary | ICD-10-CM

## 2010-11-12 DIAGNOSIS — B20 Human immunodeficiency virus [HIV] disease: Secondary | ICD-10-CM | POA: Diagnosis present

## 2010-11-12 HISTORY — DX: Asymptomatic human immunodeficiency virus (hiv) infection status: Z21

## 2010-11-12 HISTORY — DX: Human immunodeficiency virus (HIV) disease: B20

## 2010-11-12 HISTORY — DX: Anemia, unspecified: D64.9

## 2010-11-12 HISTORY — DX: Depression, unspecified: F32.A

## 2010-11-12 HISTORY — DX: Herpesviral infection, unspecified: B00.9

## 2010-11-12 HISTORY — DX: Major depressive disorder, single episode, unspecified: F32.9

## 2010-11-12 LAB — URINALYSIS, ROUTINE W REFLEX MICROSCOPIC
Glucose, UA: NEGATIVE mg/dL
Ketones, ur: 15 mg/dL — AB
Leukocytes, UA: NEGATIVE
Protein, ur: NEGATIVE mg/dL

## 2010-11-12 LAB — HCG, QUANTITATIVE, PREGNANCY: hCG, Beta Chain, Quant, S: 9801 m[IU]/mL — ABNORMAL HIGH (ref <5)

## 2010-11-12 LAB — URINE MICROSCOPIC-ADD ON

## 2010-11-12 MED ORDER — ONDANSETRON HCL 4 MG/2ML IJ SOLN
4.0000 mg | Freq: Once | INTRAMUSCULAR | Status: AC
  Administered 2010-11-12: 4 mg via INTRAVENOUS
  Filled 2010-11-12: qty 2

## 2010-11-12 MED ORDER — ONDANSETRON HCL 8 MG PO TABS
8.0000 mg | ORAL_TABLET | Freq: Three times a day (TID) | ORAL | Status: AC | PRN
Start: 2010-11-12 — End: 2010-11-19

## 2010-11-12 MED ORDER — METRONIDAZOLE 500 MG PO TABS
2000.0000 mg | ORAL_TABLET | Freq: Once | ORAL | Status: AC
  Administered 2010-11-12: 2000 mg via ORAL
  Filled 2010-11-12: qty 4

## 2010-11-12 MED ORDER — LACTATED RINGERS IV BOLUS (SEPSIS)
1000.0000 mL | Freq: Once | INTRAVENOUS | Status: AC
  Administered 2010-11-12: 1000 mL via INTRAVENOUS

## 2010-11-12 MED ORDER — PRENATAL RX 60-1 MG PO TABS: 1.0000 | ORAL_TABLET | Freq: Every day | ORAL | Status: DC

## 2010-11-12 NOTE — ED Provider Notes (Signed)
History     Chief Complaint  Patient presents with  . Emesis   HPI    Past Medical History  Diagnosis Date  . HIV (human immunodeficiency virus infection)   . Depression   . Anemia   . Bronchitis   . HSV (herpes simplex virus) infection     Past Surgical History  Procedure Date  . Dilation and curettage of uterus     History reviewed. No pertinent family history.  History  Substance Use Topics  . Smoking status: Current Everyday Smoker -- 11.0 packs/day    Types: Cigarettes  . Smokeless tobacco: Current User  . Alcohol Use: No    Allergies: No Known Allergies  No prescriptions prior to admission    ROS Physical Exam   Blood pressure 113/61, pulse 57, temperature 99.3 F (37.4 C), temperature source Oral, resp. rate 18, height 5\' 4"  (1.626 m), weight 96 lb 9.6 oz (43.817 kg), last menstrual period 09/21/2010.  Physical Exam  MAU Course  Procedures  MDM  Assessment and Plan  [facility] from [place] on [address] called to say that pt did not have insurance and that Zofran would be more than $100, would we prescribe phenergan. Phenergan 25 mg tablets: 1/2 to 1 tablet every 6 hours as needed for Nausea/vomiting #30 no refills prescribed  Taylar Hartsough 11/12/2010, 11:02 PM

## 2010-11-12 NOTE — Progress Notes (Signed)
Also had light spotting this morning

## 2010-11-12 NOTE — Progress Notes (Signed)
Took home pregnancy test this morning, positive, sore breasts, can't keep anything on her stomach, tiredness.

## 2010-11-12 NOTE — Progress Notes (Signed)
Pt had positive preg test today and came here to see "whats going on". Last menstrual cycle is unclear, she guesses it was May 27. Pt states yesterday she was unable to keep anything down.

## 2010-11-12 NOTE — ED Provider Notes (Signed)
RADIOLOGY REPORT*  Clinical Data: Spotting. Positive urine pregnancy test.  OBSTETRIC <14 WK [place] AND TRANSVAGINAL OB US  Technique: Both transabdominal and transvaginal ultrasound  examinations were performed for complete evaluation of the  gestation as well as the maternal uterus, adnexal regions, and  pelvic cul-de-sac. Transvaginal technique was performed to assess  early pregnancy.  Comparison: None.  Intrauterine gestational sac: Not visualized  Yolk sac: Not visualized  Embryo: Not visualized  Cardiac Activity: Not visualized  Maternal uterus/adnexae:  Endometrial thickness of 13.6 mm. Small Nabothian cyst  incidentally noted.  Right ovary is heterogeneous with 3 cm echogenic structure which  may represent a dermoid. Recommend attention to this on follow-up.  Trace free fluid.  IMPRESSION:  Intrauterine pregnancy is not visualized. This may be because of  the early gestational age. In the proper clinical setting, ectopic  pregnancy cannot be excluded. Clinical correlation, laboratory  correlation and follow-up ultrasound recommended for further  delineation.  When the patient has a follow-up ultrasound, attention to the right  ovary is recommended as there may be a 3 cm dermoid.  This is a call report. (Diane ultrasound technologist).  Original Report Authenticated By: Fuller Canada, M.D.  Reviewed results of [place] w/ pt. Either earlier gestation than expected, non-viable pregnancy or ectopic.  F/U in 48 hours for repeat BHCG. Ectopic precautions Rx PNV

## 2010-11-12 NOTE — ED Provider Notes (Signed)
ABO Rh not ordered in error. Will order w/ repeat Quant hCG on 11/14/10.

## 2010-11-12 NOTE — ED Provider Notes (Signed)
History   The pt is a 25 year-old female 7.[redacted] weeks gestation by LMP who presents to MAU reporting N/V since yesterday an scant VB today. She is HIV positive followed by the Infection Disease Clinic. She states she takes her antiviral medications regularly, but could not take them this morning due to nausea. She states she is Rh pos.  Chief Complaint  Patient presents with  . Emesis   Emesis  This is a new problem. The current episode started yesterday. The problem occurs intermittently. The problem has been unchanged. There has been no fever. Pertinent negatives include no abdominal pain, chills, diarrhea or fever. She has tried nothing for the symptoms.    OB History    Grav Para Term Preterm Abortions TAB SAB Ect Mult Living   2    1  1    0      Past Medical History  Diagnosis Date  . HIV (human immunodeficiency virus infection)   . Depression   . Anemia   . Bronchitis   . HSV (herpes simplex virus) infection     Past Surgical History  Procedure Date  . Dilation and curettage of uterus     History reviewed. No pertinent family history.  History  Substance Use Topics  . Smoking status: Current Everyday Smoker -- 11.0 packs/day    Types: Cigarettes  . Smokeless tobacco: Current User  . Alcohol Use: No   azithromycin (ZITHROMAX) 600 MG table darunavir (PREZISTA) 400 MG table emtricitabine-tenofovir (TRUVADA) 200-300 MG per tablet  clotrimazole-betamethasone (LOTRISONE) cream famotidine (PEPCID) 20 MG tablet ratonavir (NORVIR) 100 MG capsule   Allergies: No Known Allergies  Review of Systems  Constitutional: Negative for fever and chills.  Gastrointestinal: Positive for vomiting. Negative for abdominal pain and diarrhea.   Physical Exam   Blood pressure 120/84, pulse 80, temperature 99.3 F (37.4 C), temperature source Oral, resp. rate 16, height 5\' 4"  (1.626 m), weight 43.817 kg (96 lb 9.6 oz), last menstrual period 09/21/2010.  Physical Exam  Constitutional:  She is oriented to person, place, and time. She appears well-developed. She appears cachectic. No distress.  Cardiovascular: Normal rate.   Respiratory: Effort normal.  GI: Soft. Bowel sounds are normal. There is no tenderness.  Genitourinary: Vagina normal. Uterus is not enlarged (S<D). Cervix exhibits no motion tenderness, no discharge and no friability. Right adnexum displays no mass and no tenderness. Left adnexum displays no mass and no tenderness. No bleeding around the vagina. No vaginal discharge found.  Neurological: She is alert and oriented to person, place, and time.  Skin: Skin is warm and dry.   Results for orders placed during the hospital encounter of 11/12/10 (from the past 24 hour(s))  URINALYSIS, ROUTINE W REFLEX MICROSCOPIC     Status: Abnormal   Collection Time   11/12/10  4:10 PM      Component Value Range   Color, Urine YELLOW  YELLOW    Appearance HAZY (*) CLEAR    Specific Gravity, Urine >1.030 (*) 1.005 - 1.030    pH 6.0  5.0 - 8.0    Glucose, UA NEGATIVE  NEGATIVE (mg/dL)   Hgb urine dipstick TRACE (*) NEGATIVE    Bilirubin Urine NEGATIVE  NEGATIVE    Ketones, ur 15 (*) NEGATIVE (mg/dL)   Protein, ur NEGATIVE  NEGATIVE (mg/dL)   Urobilinogen, UA 0.2  0.0 - 1.0 (mg/dL)   Nitrite NEGATIVE  NEGATIVE    Leukocytes, UA NEGATIVE  NEGATIVE   URINE MICROSCOPIC-ADD ON  Status: Abnormal   Collection Time   11/12/10  4:10 PM      Component Value Range   Squamous Epithelial / LPF MANY (*) RARE    WBC, UA 0-2  <3 (WBC/hpf)   Crystals CA OXALATE CRYSTALS (*) NEGATIVE   POCT PREGNANCY, URINE     Status: Normal   Collection Time   11/12/10  4:20 PM      Component Value Range   Preg Test, Ur POSITIVE    HCG, QUANTITATIVE, PREGNANCY     Status: Abnormal   Collection Time   11/12/10  5:41 PM      Component Value Range   hCG, Beta Chain, Quant, S 9801 (*) <5 (mIU/mL)  WET PREP, GENITAL     Status: Abnormal   Collection Time   11/12/10  6:52 PM      Component Value  Range   Yeast, Wet Prep NONE SEEN  NONE SEEN    Trich, Wet Prep FEW (*) NONE SEEN    Clue Cells, Wet Prep NONE SEEN  NONE SEEN    WBC, Wet Prep HPF POC FEW (*) NONE SEEN      MAU Course  Procedures   Assessment and Plan  Assessment: 1. Early pregnancy 2. HIV pos in Tx 3. N/V of pregnancy 4. Trichomonas  Plan: 1.OB US <14 weeks 2. Flagyl 2 gm PO x 1. Offered partner Tx, declined. Instructed to have partner Tx and abstain x 1 week after BOTH are Tx. Discussed safe sex practices. 3. LR bolus 1000 ml 4. Zofran IV and Rx for home 5. Consulted with Dr. Emelda Fear RE: special considerations for HIV pos status, safety of antiviral meds. Attempted to call On-Call ID MD w/ no response at the time of this note. All meds category B or C. Rec continuing meds  And F/U w/ ID clinic ASAP to discuss HIV R/T pregnancy. 6. ABO Rh    Cumberledge,Grisel Blumenstock 11/12/2010, 7:20 PM

## 2010-11-13 ENCOUNTER — Ambulatory Visit (INDEPENDENT_AMBULATORY_CARE_PROVIDER_SITE_OTHER): Payer: Self-pay | Admitting: Adult Health

## 2010-11-13 ENCOUNTER — Encounter: Payer: Self-pay | Admitting: Adult Health

## 2010-11-13 DIAGNOSIS — O98919 Unspecified maternal infectious and parasitic disease complicating pregnancy, unspecified trimester: Secondary | ICD-10-CM

## 2010-11-13 DIAGNOSIS — IMO0002 Reserved for concepts with insufficient information to code with codable children: Secondary | ICD-10-CM

## 2010-11-13 DIAGNOSIS — B20 Human immunodeficiency virus [HIV] disease: Secondary | ICD-10-CM

## 2010-11-13 LAB — T-HELPER CELLS (CD4) COUNT (NOT AT ARMC): CD4 % Helper T Cell: 17 % — ABNORMAL LOW (ref 33–55)

## 2010-11-13 LAB — GC/CHLAMYDIA PROBE AMP, GENITAL: GC Probe Amp, Genital: NEGATIVE

## 2010-11-13 MED ORDER — ATAZANAVIR SULFATE 300 MG PO CAPS: 300.0000 mg | ORAL_CAPSULE | Freq: Every day | ORAL | Status: DC

## 2010-11-13 MED ORDER — RITONAVIR 100 MG PO TABS: 100.0000 mg | ORAL_TABLET | Freq: Every day | ORAL | Status: DC

## 2010-11-14 ENCOUNTER — Inpatient Hospital Stay (HOSPITAL_COMMUNITY)
Admission: AD | Admit: 2010-11-14 | Discharge: 2010-11-14 | Disposition: A | Discharge status: Home / Self Care | Payer: Self-pay | Source: Ambulatory Visit | Attending: Obstetrics & Gynecology | Admitting: Obstetrics & Gynecology

## 2010-11-14 ENCOUNTER — Encounter (HOSPITAL_COMMUNITY): Payer: Self-pay

## 2010-11-14 ENCOUNTER — Inpatient Hospital Stay (HOSPITAL_COMMUNITY): Payer: Self-pay

## 2010-11-14 ENCOUNTER — Inpatient Hospital Stay (HOSPITAL_COMMUNITY): Admit: 2010-11-14 | Payer: Self-pay

## 2010-11-14 DIAGNOSIS — Z21 Asymptomatic human immunodeficiency virus [HIV] infection status: Secondary | ICD-10-CM | POA: Insufficient documentation

## 2010-11-14 DIAGNOSIS — F17200 Nicotine dependence, unspecified, uncomplicated: Secondary | ICD-10-CM | POA: Insufficient documentation

## 2010-11-14 DIAGNOSIS — O009 Unspecified ectopic pregnancy without intrauterine pregnancy: Secondary | ICD-10-CM | POA: Insufficient documentation

## 2010-11-14 LAB — CBC
HCT: 40 % (ref 36.0–46.0)
Hemoglobin: 14.4 g/dL (ref 12.0–15.0)
MCH: 33.7 pg (ref 26.0–34.0)
MCHC: 36 g/dL (ref 30.0–36.0)

## 2010-11-14 LAB — DIFFERENTIAL
Basophils Relative: 1 % (ref 0–1)
Lymphs Abs: 2.8 10*3/uL (ref 0.7–4.0)
Monocytes Absolute: 0.6 10*3/uL (ref 0.1–1.0)
Monocytes Relative: 9 % (ref 3–12)
Neutro Abs: 2.8 10*3/uL (ref 1.7–7.7)

## 2010-11-14 LAB — AST: AST: 15 U/L (ref 0–37)

## 2010-11-14 LAB — HCG, QUANTITATIVE, PREGNANCY: hCG, Beta Chain, Quant, S: 10469 m[IU]/mL — ABNORMAL HIGH (ref <5)

## 2010-11-14 MED ORDER — RHO D IMMUNE GLOBULIN 1500 UNIT/2ML IJ SOLN
300.0000 ug | Freq: Once | INTRAMUSCULAR | Status: AC
  Administered 2010-11-14: 300 ug via INTRAMUSCULAR

## 2010-11-14 MED ORDER — METHOTREXATE INJECTION FOR WOMEN'S HOSPITAL
50.0000 mg/m2 | Freq: Once | INTRAMUSCULAR | Status: AC
  Administered 2010-11-14: 70 mg via INTRAMUSCULAR
  Filled 2010-11-14: qty 1.4

## 2010-11-14 NOTE — ED Provider Notes (Addendum)
History     Chief Complaint  Patient presents with  . Follow-up   HPI Patient returns for followup BHCG.  She was seen 7/18.  BHCG at that time was 9801. U/S did not see an IUGS or YS.  Patient on that date would have been [redacted]w[redacted]d by LMP.  She is HIV positive.  She reports today that she is not having any pain, a little spotting.  She was dx with Trichomonas last visit.    Past Medical History  Diagnosis Date  . HIV (human immunodeficiency virus infection)   . Depression   . Anemia   . Bronchitis   . HSV (herpes simplex virus) infection     Past Surgical History  Procedure Date  . Dilation and curettage of uterus     History reviewed. No pertinent family history.  History  Substance Use Topics  . Smoking status: Current Everyday Smoker -- 11.0 packs/day    Types: Cigarettes  . Smokeless tobacco: Current User  . Alcohol Use: No    Allergies: No Known Allergies  Prescriptions prior to admission  Medication Sig Dispense Refill  . atazanavir (REYATAZ) 300 MG capsule Take 1 capsule (300 mg total) by mouth daily with breakfast. MUST TAKE WITH NORVIR  30 capsule  5  . clotrimazole-betamethasone (LOTRISONE) cream Apply 1 application topically 2 (two) times daily.        Marland Kitchen emtricitabine-tenofovir (TRUVADA) 200-300 MG per tablet Take 1 tablet by mouth daily.        . famotidine (PEPCID) 20 MG tablet Take 1 tablet (20 mg total) by mouth 2 (two) times daily.  180 tablet  3  . naphazoline (CLEAR EYES) 0.012 % ophthalmic solution Place 2 drops into both eyes 4 (four) times daily.        . ondansetron (ZOFRAN) 8 MG tablet Take 1 tablet (8 mg total) by mouth every 8 (eight) hours as needed for nausea.  20 tablet  1  . Prenatal Vit-Fe Fumarate-FA (PRENATAL MULTIVITAMIN) 60-1 MG tablet Take 1 tablet by mouth daily.  30 tablet  12  . ritonavir (NORVIR) 100 MG TABS Take 1 tablet (100 mg total) by mouth daily. TAKE WITH REYATAZ  30 tablet  5     ROS Physical Exam   Blood pressure 121/67,  pulse 63, temperature 98.7 F (37.1 C), temperature source Oral, resp. rate 16, last menstrual period 09/21/2010.  Physical Exam  GI: Soft. She exhibits no distension and no mass. There is no tenderness. There is no rebound and no guarding.    MAU Course  Procedures Results for AVENLY, ROBERGE (MRN [id number]) as of 11/14/2010 18:41  Ref. Range 11/14/2010 15:45  hCG, Beta Chain, Quant, S Latest Range: <5 mIU/mL 10469 (H)   MTX labs ordered. Results for orders placed during the hospital encounter of 11/14/10 (from the past 24 hour(s))  HCG, QUANTITATIVE, PREGNANCY     Status: Abnormal   Collection Time   11/14/10  3:45 PM      Component Value Range   hCG, Beta Chain, Quant, S 10469 (*) <5 (mIU/mL)  AST     Status: Normal   Collection Time   11/14/10  7:12 PM      Component Value Range   AST 15  0 - 37 (U/L)  BUN     Status: Normal   Collection Time   11/14/10  7:12 PM      Component Value Range   BUN 8  6 - 23 (mg/dL)  CREATININE,  SERUM     Status: Normal   Collection Time   11/14/10  7:12 PM      Component Value Range   Creatinine, Ser 0.60  0.50 - 1.10 (mg/dL)   GFR calc non Af Amer >60  >60 (mL/min)   GFR calc Af Amer >60  >60 (mL/min)  CBC     Status: Normal   Collection Time   11/14/10  7:12 PM      Component Value Range   WBC 6.4  4.0 - 10.5 (K/uL)   RBC 4.27  3.87 - 5.11 (MIL/uL)   Hemoglobin 14.4  12.0 - 15.0 (g/dL)   HCT 16.1  09.6 - 04.5 (%)   MCV 93.7  78.0 - 100.0 (fL)   MCH 33.7  26.0 - 34.0 (pg)   MCHC 36.0  30.0 - 36.0 (g/dL)   RDW 40.9  81.1 - 91.4 (%)   Platelets 265  150 - 400 (K/uL)  DIFFERENTIAL     Status: Normal   Collection Time   11/14/10  7:12 PM      Component Value Range   Neutrophils Relative 44  43 - 77 (%)   Neutro Abs 2.8  1.7 - 7.7 (K/uL)   Lymphocytes Relative 45  12 - 46 (%)   Lymphs Abs 2.8  0.7 - 4.0 (K/uL)   Monocytes Relative 9  3 - 12 (%)   Monocytes Absolute 0.6  0.1 - 1.0 (K/uL)   Eosinophils Relative 2  0 - 5 (%)    Eosinophils Absolute 0.1  0.0 - 0.7 (K/uL)   Basophils Relative 1  0 - 1 (%)   Basophils Absolute 0.0  0.0 - 0.1 (K/uL)  ABO/RH     Status: Normal   Collection Time   11/14/10  7:12 PM      Component Value Range   ABO/RH(D) O NEG      MDM Consulted with Dr. Jolayne Panther.  U/S ordered for abnormally rising HCG.   9801 on 7/18  Rhogam workup and Rhophylac ordered for O NEG BLood type  Assessment and Plan  Discussed at length the lab findings and u/s report with the patient and her partner.  Time was given for them to ask questions.  She agreed with suggested plan for Methotrexate for ectopic pregnancy.   Went in and discussed blood type with the patient and explained Rhophylac.  Patient to followup per [facility]tre[facility] protocol.  KEY,EVE M 11/14/2010, 3:58 PM   Matt Holmes, NP 11/29/10 1557

## 2010-11-14 NOTE — Progress Notes (Signed)
Pt to MAU for repeat BHCG. Pt states no pain and only one spot of blood.

## 2010-11-15 LAB — RH IG WORKUP (INCLUDES ABO/RH): ABO/RH(D): O NEG

## 2010-11-16 ENCOUNTER — Encounter: Payer: Self-pay | Admitting: Advanced Practice Midwife

## 2010-11-17 ENCOUNTER — Inpatient Hospital Stay (HOSPITAL_COMMUNITY)
Admission: AD | Admit: 2010-11-17 | Discharge: 2010-11-17 | Disposition: A | Discharge status: Home / Self Care | Payer: Self-pay | Source: Ambulatory Visit | Attending: Obstetrics & Gynecology | Admitting: Obstetrics & Gynecology

## 2010-11-17 ENCOUNTER — Ambulatory Visit (HOSPITAL_COMMUNITY): Payer: Self-pay

## 2010-11-17 DIAGNOSIS — O00109 Unspecified tubal pregnancy without intrauterine pregnancy: Secondary | ICD-10-CM | POA: Insufficient documentation

## 2010-11-17 NOTE — Progress Notes (Signed)
Pt states no pain or bleeding post MTX injection last week. Here for f/u bhcg only.

## 2010-11-20 ENCOUNTER — Other Ambulatory Visit: Payer: Self-pay | Admitting: Obstetrics & Gynecology

## 2010-11-20 ENCOUNTER — Inpatient Hospital Stay (HOSPITAL_COMMUNITY): Admission: RE | Admit: 2010-11-20 | Payer: Self-pay | Source: Ambulatory Visit

## 2010-11-20 ENCOUNTER — Inpatient Hospital Stay (HOSPITAL_COMMUNITY)
Admission: AD | Admit: 2010-11-20 | Discharge: 2010-11-20 | Disposition: A | Discharge status: Home / Self Care | Payer: Self-pay | Source: Ambulatory Visit | Attending: Obstetrics & Gynecology | Admitting: Obstetrics & Gynecology

## 2010-11-20 DIAGNOSIS — O0281 Inappropriate change in quantitative human chorionic gonadotropin (hCG) in early pregnancy: Secondary | ICD-10-CM | POA: Insufficient documentation

## 2010-11-20 DIAGNOSIS — Z21 Asymptomatic human immunodeficiency virus [HIV] infection status: Secondary | ICD-10-CM | POA: Insufficient documentation

## 2010-11-20 DIAGNOSIS — O9989 Other specified diseases and conditions complicating pregnancy, childbirth and the puerperium: Secondary | ICD-10-CM | POA: Insufficient documentation

## 2010-11-20 DIAGNOSIS — O3680X0 Pregnancy with inconclusive fetal viability, not applicable or unspecified: Secondary | ICD-10-CM

## 2010-11-20 LAB — HCG, QUANTITATIVE, PREGNANCY: hCG, Beta Chain, Quant, S: 11958 m[IU]/mL — ABNORMAL HIGH (ref <5)

## 2010-11-20 NOTE — ED Provider Notes (Addendum)
[name] is a 25 y.o. G1 at 7 wks. Gestation. She had her initial visit 11/12/10 and had a positive pregnancy test. Her Bhcg at that time was 9801 and the ultrasound showed no IUGS. She returned 7/20 and the hormone level increased to 10469 at that time she had MTX and on  7/23 increased to 13509, today the number has dropped to 11958. This is the patient's first pregnancy and this is a much wanted pregnancy. She was instructed to return weekly to repeat the level until it drops to zero. Since there is no documented IUP we will continue to follow her. The patient is HIV positive. Instructions to patient if she has severe pain, heavy bleeding or other problems she will return immediately.    [place], NP 11/20/10 1453  [place], NP 11/20/10 1827

## 2010-11-20 NOTE — ED Provider Notes (Signed)
Will obtain another ultrasound; high suspicion for ectopic pregnancy.  Follow up results as per r/o ectopic policy.  Shirla Hodgkiss A 3:29 PM

## 2010-11-20 NOTE — Progress Notes (Signed)
Pt to MAU for repeat BHCG. Pt denies any pain or bleeding.  

## 2010-11-21 ENCOUNTER — Ambulatory Visit (HOSPITAL_COMMUNITY): Payer: Self-pay

## 2010-11-22 ENCOUNTER — Ambulatory Visit (HOSPITAL_COMMUNITY): Payer: Self-pay

## 2010-11-27 ENCOUNTER — Inpatient Hospital Stay (HOSPITAL_COMMUNITY)
Admission: AD | Admit: 2010-11-27 | Discharge: 2010-11-27 | Disposition: A | Discharge status: Home / Self Care | Payer: Self-pay | Source: Ambulatory Visit | Attending: Obstetrics & Gynecology | Admitting: Obstetrics & Gynecology

## 2010-11-27 DIAGNOSIS — Z21 Asymptomatic human immunodeficiency virus [HIV] infection status: Secondary | ICD-10-CM | POA: Insufficient documentation

## 2010-11-27 DIAGNOSIS — O009 Unspecified ectopic pregnancy without intrauterine pregnancy: Secondary | ICD-10-CM | POA: Insufficient documentation

## 2010-11-27 LAB — HCG, QUANTITATIVE, PREGNANCY: hCG, Beta Chain, Quant, S: 5683 m[IU]/mL — ABNORMAL HIGH (ref <5)

## 2010-11-27 NOTE — Progress Notes (Signed)
Pt states she had heavy bleeding 2 days ago with cramping, no pain today, slight bleeding.

## 2010-11-27 NOTE — ED Provider Notes (Addendum)
[name] is a 25 y.o. female who return for follow up bhcg after MTX. She is HIV positive. On her initial visit  7/18 her Bhcg was 9,801 and her u/s showed no IUP. She returned 11/14/10 and the Bhcg was 10,469 and she received MTX. She returned 7/23 and the numbers increased to 13,509, then on 7/26 there was a decrease to 11,958. Today the Bhcg is 5,683. The patient reports small amount of cramping and bleeding. We will continue to follow the patient on a weekly basis until the Bhcg reaches zero. The patient will return sooner if there  Are any problems.    [place], NP 11/27/10 [address] [place] 12/02/10 [phone]

## 2010-12-04 ENCOUNTER — Inpatient Hospital Stay (HOSPITAL_COMMUNITY)
Admission: AD | Admit: 2010-12-04 | Discharge: 2010-12-04 | Disposition: A | Discharge status: Home / Self Care | Payer: Self-pay | Source: Ambulatory Visit | Attending: Obstetrics & Gynecology | Admitting: Obstetrics & Gynecology

## 2010-12-04 DIAGNOSIS — O009 Unspecified ectopic pregnancy without intrauterine pregnancy: Secondary | ICD-10-CM

## 2010-12-04 DIAGNOSIS — O00109 Unspecified tubal pregnancy without intrauterine pregnancy: Secondary | ICD-10-CM | POA: Insufficient documentation

## 2010-12-04 LAB — HCG, QUANTITATIVE, PREGNANCY: hCG, Beta Chain, Quant, S: 1425 m[IU]/mL — ABNORMAL HIGH (ref <5)

## 2010-12-04 NOTE — ED Provider Notes (Addendum)
History   Pt presents today for her weekly B-quant. She was tx with methotrexate in 7/12 for ectopic pregnancy. She states she is doing well and denies any pain or any other problems at this time.  Chief Complaint  Patient presents with  . Follow-up   HPI  OB History    Grav Para Term Preterm Abortions TAB SAB Ect Mult Living   2    1     0      Past Medical History  Diagnosis Date  . HIV (human immunodeficiency virus infection)   . Depression   . Anemia   . Bronchitis   . HSV (herpes simplex virus) infection     Past Surgical History  Procedure Date  . Dilation and curettage of uterus     No family history on file.  History  Substance Use Topics  . Smoking status: Current Everyday Smoker -- 11.0 packs/day    Types: Cigarettes  . Smokeless tobacco: Current User  . Alcohol Use: No    Allergies: No Known Allergies  Prescriptions prior to admission  Medication Sig Dispense Refill  . atazanavir (REYATAZ) 300 MG capsule Take 1 capsule (300 mg total) by mouth daily with breakfast. MUST TAKE WITH NORVIR  30 capsule  5  . clotrimazole-betamethasone (LOTRISONE) cream Apply 1 application topically 2 (two) times daily.       Marland Kitchen emtricitabine-tenofovir (TRUVADA) 200-300 MG per tablet Take 1 tablet by mouth daily.        . famotidine (PEPCID) 20 MG tablet Take 1 tablet (20 mg total) by mouth 2 (two) times daily.  180 tablet  3  . naphazoline (CLEAR EYES) 0.012 % ophthalmic solution Place 2 drops into both eyes 4 (four) times daily.        . ritonavir (NORVIR) 100 MG TABS Take 1 tablet (100 mg total) by mouth daily. TAKE WITH REYATAZ  30 tablet  5    Review of Systems  Constitutional: Negative for fever and chills.  Cardiovascular: Negative for chest pain.  Gastrointestinal: Negative for nausea, vomiting, abdominal pain, diarrhea and constipation.  Genitourinary: Negative for dysuria, urgency, frequency and hematuria.  Neurological: Negative for dizziness and headaches.    Psychiatric/Behavioral: Negative for depression and suicidal ideas.   Physical Exam   Blood pressure 111/67, pulse 67, temperature 98.8 F (37.1 C), temperature source Oral, resp. rate 16, last menstrual period 09/21/2010.  Physical Exam  Constitutional: She is oriented to person, place, and time. She appears well-developed and well-nourished. No distress.  HENT:  Head: Normocephalic and atraumatic.  GI: Soft. She exhibits no distension and no mass. There is no tenderness. There is no rebound and no guarding.  Neurological: She is alert and oriented to person, place, and time.  Skin: Skin is warm and dry. She is not diaphoretic.  Psychiatric: She has a normal mood and affect. Her behavior is normal. Judgment and thought content normal.    MAU Course  Procedures  Results for orders placed during the hospital encounter of 12/04/10 (from the past 24 hour(s))  HCG, QUANTITATIVE, PREGNANCY     Status: Abnormal   Collection Time   12/04/10  9:44 AM      Component Value Range   hCG, Beta Chain, Quant, S 1425 (*) <5 (mIU/mL)     Assessment and Plan  Ectopic preg: pt is doing well and her B-quants continue to fall. She will return in 1wk for a repeat B-quant. Discussed diet, activity, risks, and precautions.  Clinton Gallant.  Wilberth Damon III, DrHSc, MPAS, PA-C  12/04/2010, 11:16 AM   Henrietta Hoover, PA 12/04/10 1122

## 2010-12-04 NOTE — Progress Notes (Signed)
Pt to MAU for BHCG s/p MTX on 7-20. Pt reports no pain with a little spotting.

## 2010-12-10 DIAGNOSIS — B20 Human immunodeficiency virus [HIV] disease: Secondary | ICD-10-CM

## 2010-12-11 ENCOUNTER — Telehealth: Payer: Self-pay | Admitting: *Deleted

## 2010-12-11 ENCOUNTER — Ambulatory Visit (INDEPENDENT_AMBULATORY_CARE_PROVIDER_SITE_OTHER): Payer: Self-pay | Admitting: Adult Health

## 2010-12-11 ENCOUNTER — Encounter: Payer: Self-pay | Admitting: Adult Health

## 2010-12-11 ENCOUNTER — Encounter (HOSPITAL_COMMUNITY): Payer: Self-pay

## 2010-12-11 ENCOUNTER — Inpatient Hospital Stay (HOSPITAL_COMMUNITY)
Admission: AD | Admit: 2010-12-11 | Discharge: 2010-12-11 | Disposition: A | Discharge status: Home / Self Care | Payer: Self-pay | Source: Ambulatory Visit | Attending: Obstetrics and Gynecology | Admitting: Obstetrics and Gynecology

## 2010-12-11 ENCOUNTER — Other Ambulatory Visit (INDEPENDENT_AMBULATORY_CARE_PROVIDER_SITE_OTHER): Payer: Self-pay

## 2010-12-11 VITALS — BP 128/73 | HR 72 | Temp 98.0°F | Ht 62.0 in | Wt 100.0 lb

## 2010-12-11 DIAGNOSIS — O00109 Unspecified tubal pregnancy without intrauterine pregnancy: Secondary | ICD-10-CM | POA: Insufficient documentation

## 2010-12-11 DIAGNOSIS — O009 Unspecified ectopic pregnancy without intrauterine pregnancy: Secondary | ICD-10-CM

## 2010-12-11 DIAGNOSIS — H1045 Other chronic allergic conjunctivitis: Secondary | ICD-10-CM

## 2010-12-11 DIAGNOSIS — R350 Frequency of micturition: Secondary | ICD-10-CM

## 2010-12-11 DIAGNOSIS — B20 Human immunodeficiency virus [HIV] disease: Secondary | ICD-10-CM

## 2010-12-11 DIAGNOSIS — H1010 Acute atopic conjunctivitis, unspecified eye: Secondary | ICD-10-CM

## 2010-12-11 DIAGNOSIS — Z21 Asymptomatic human immunodeficiency virus [HIV] infection status: Secondary | ICD-10-CM

## 2010-12-11 LAB — POCT URINALYSIS DIPSTICK
Bilirubin, UA: NEGATIVE
Glucose, UA: NEGATIVE
Leukocytes, UA: NEGATIVE
Nitrite, UA: NEGATIVE
pH, UA: 7.5

## 2010-12-11 MED ORDER — CROMOLYN SODIUM 4 % OP SOLN: 1.0000 [drp] | Freq: Four times a day (QID) | OPHTHALMIC | Status: DC

## 2010-12-11 NOTE — Patient Instructions (Signed)
Allergic Conjunctivitis A thin membrane covers the eyeball and underside of the eyelids. This membrane (conjunctiva) can become irritated by animal dander, pollen, perfumes, or smoke (allergens). The membrane may become puffy (swollen) and red. Small bumps (follicles) may form on the inside of the eyelids. Your eyes may get teary, itchy, or burn. It cannot be spread from person-to-person (contagious).    HOME CARE  Wash your hands before and after applying drops or ointments.   Do not touch the drop or ointment tube to your eye or eyelids.   Do not use your soft contacts. Throw them away. Use a new pair once recovery is complete.   Do not use your hard contacts. They need to be washed (sterilized) thoroughly after recovery is complete.   Put a cold cloth to your eye(s) if you have itching and burning.  GET HELP IF:  Your not feeling better in 2 to 3 days after treatment.   Your lids are sticky or stick together.   Fluid comes from the eye(s).   You become sensitive to light.   You have a temperature by mouth above 102.   You have pain in and around the eye.   You have more problems with your eye.  MAKE SURE YOU:    Understand these instructions.   Will watch your condition.   Will get help right away if you are not doing well or get worse.  Document Released: 10/01/2009   [facility] Patient Information 2011 [place], [place].

## 2010-12-11 NOTE — Progress Notes (Signed)
Patient reports started bleeding this morning thinks it is her period, no pain, on weekly blood draws for Methotrexate

## 2010-12-11 NOTE — ED Provider Notes (Addendum)
History   Pt presents today for her weekly B-quant following methotrexate tx for ectopic preg. She states she is doing well and has no complaints at this time.  Chief Complaint  Patient presents with  . Ectopic Pregnancy   HPI  OB History    Grav Para Term Preterm Abortions TAB SAB Ect Mult Living   2    1     0      Past Medical History  Diagnosis Date  . HIV (human immunodeficiency virus infection)   . Depression   . Anemia   . Bronchitis   . HSV (herpes simplex virus) infection     Past Surgical History  Procedure Date  . Dilation and curettage of uterus     No family history on file.  History  Substance Use Topics  . Smoking status: Current Everyday Smoker -- 11.0 packs/day    Types: Cigarettes  . Smokeless tobacco: Current User  . Alcohol Use: No    Allergies: No Known Allergies  Prescriptions prior to admission  Medication Sig Dispense Refill  . atazanavir (REYATAZ) 300 MG capsule Take 1 capsule (300 mg total) by mouth daily with breakfast. MUST TAKE WITH NORVIR  30 capsule  5  . clotrimazole-betamethasone (LOTRISONE) cream Apply 1 application topically 2 (two) times daily.       Marland Kitchen emtricitabine-tenofovir (TRUVADA) 200-300 MG per tablet Take 1 tablet by mouth daily.        . famotidine (PEPCID) 20 MG tablet Take 1 tablet (20 mg total) by mouth 2 (two) times daily.  180 tablet  3  . naphazoline (CLEAR EYES) 0.012 % ophthalmic solution Place 2 drops into both eyes 4 (four) times daily.        . ritonavir (NORVIR) 100 MG TABS Take 1 tablet (100 mg total) by mouth daily. TAKE WITH REYATAZ  30 tablet  5    Review of Systems  Constitutional: Negative for fever and chills.  Cardiovascular: Negative for chest pain.  Gastrointestinal: Negative for nausea, vomiting, abdominal pain, diarrhea and constipation.  Genitourinary: Negative for dysuria, urgency, frequency, hematuria and flank pain.  Neurological: Negative for dizziness and headaches.    Psychiatric/Behavioral: Negative for depression and suicidal ideas.   Physical Exam   Blood pressure 111/78, pulse 67, temperature 99 F (37.2 C), temperature source Oral, resp. rate 16, last menstrual period 09/21/2010, unknown if currently breastfeeding.  Physical Exam  Constitutional: She is oriented to person, place, and time. She appears well-developed and well-nourished. No distress.  HENT:  Head: Normocephalic and atraumatic.  GI: Soft. She exhibits no distension. There is no tenderness. There is no rebound and no guarding.  Neurological: She is alert and oriented to person, place, and time.  Skin: Skin is warm and dry. She is not diaphoretic.  Psychiatric: She has a normal mood and affect. Her behavior is normal. Judgment and thought content normal.    MAU Course  Procedures  Results for orders placed during the hospital encounter of 12/11/10 (from the past 24 hour(s))  HCG, QUANTITATIVE, PREGNANCY     Status: Abnormal   Collection Time   12/11/10 10:35 AM      Component Value Range   hCG, Beta Chain, Quant, S 286 (*) <5 (mIU/mL)     Assessment and Plan  Ectopic preg: pt B-quant has dropped appropriately. She will return in 1wk for repeat B-quant. Discussed diet, activity, risks, and precautions.  Clinton Gallant. Rice III, DrHSc, MPAS, PA-C  12/11/2010, 11:38 AM

## 2010-12-11 NOTE — Telephone Encounter (Signed)
Came in c/o red , irritated eyes. States it started with the starting of reyataz & does not want to take it. appt with NP now to discuss

## 2010-12-12 LAB — CBC WITH DIFFERENTIAL/PLATELET
Basophils Absolute: 0 10*3/uL (ref 0.0–0.1)
Basophils Relative: 1 % (ref 0–1)
Eosinophils Absolute: 0.1 10*3/uL (ref 0.0–0.7)
Eosinophils Relative: 4 % (ref 0–5)
MCH: 33.3 pg (ref 26.0–34.0)
MCV: 96.8 fL (ref 78.0–100.0)
Neutrophils Relative %: 47 % (ref 43–77)
Platelets: 285 10*3/uL (ref 150–400)
RDW: 14.8 % (ref 11.5–15.5)

## 2010-12-12 LAB — COMPLETE METABOLIC PANEL WITH GFR
Albumin: 4.4 g/dL (ref 3.5–5.2)
Alkaline Phosphatase: 48 U/L (ref 39–117)
BUN: 12 mg/dL (ref 6–23)
Creat: 0.65 mg/dL (ref 0.50–1.10)
GFR, Est Non African American: >60 mL/min (ref >60)
Glucose, Bld: 73 mg/dL (ref 70–99)
Total Bilirubin: 1.1 mg/dL (ref 0.3–1.2)

## 2010-12-12 LAB — T-HELPER CELL (CD4) - (RCID CLINIC ONLY)
CD4 % Helper T Cell: 19 % — ABNORMAL LOW (ref 33–55)
CD4 T Cell Abs: 290 uL — ABNORMAL LOW (ref 400–2700)

## 2010-12-18 ENCOUNTER — Encounter (HOSPITAL_COMMUNITY): Payer: Self-pay

## 2010-12-18 ENCOUNTER — Inpatient Hospital Stay (HOSPITAL_COMMUNITY)
Admission: AD | Admit: 2010-12-18 | Discharge: 2010-12-18 | Disposition: A | Discharge status: Home / Self Care | Payer: Self-pay | Source: Ambulatory Visit | Attending: Obstetrics & Gynecology | Admitting: Obstetrics & Gynecology

## 2010-12-18 DIAGNOSIS — O009 Unspecified ectopic pregnancy without intrauterine pregnancy: Secondary | ICD-10-CM

## 2010-12-18 DIAGNOSIS — O00109 Unspecified tubal pregnancy without intrauterine pregnancy: Secondary | ICD-10-CM | POA: Insufficient documentation

## 2010-12-18 LAB — HCG, QUANTITATIVE, PREGNANCY: hCG, Beta Chain, Quant, S: 56 m[IU]/mL — ABNORMAL HIGH (ref <5)

## 2010-12-18 NOTE — Progress Notes (Signed)
Pt here for follow-up repeat bhcg, denies pain or bleeding. Lmp 12/09/2010, lasting 4 days. For f/u only.

## 2010-12-18 NOTE — Progress Notes (Signed)
S:  Pt here for repeat weekly BHCG.  Here with no report of pelvic pain.   O:  A&Ox3; no signs of acute distress; BHCG 56; down from 286 on 12/11/10. A:  BHCG P:  Consulted with Dr. Marice Potter; return in one week for another BHCG level until it reaches non-pregnant level. [facility]

## 2010-12-25 ENCOUNTER — Ambulatory Visit (HOSPITAL_COMMUNITY): Payer: Self-pay

## 2010-12-25 ENCOUNTER — Encounter: Payer: Self-pay | Admitting: Adult Health

## 2010-12-25 ENCOUNTER — Ambulatory Visit (INDEPENDENT_AMBULATORY_CARE_PROVIDER_SITE_OTHER): Payer: Self-pay | Admitting: Adult Health

## 2010-12-25 ENCOUNTER — Inpatient Hospital Stay (HOSPITAL_COMMUNITY)
Admission: AD | Admit: 2010-12-25 | Discharge: 2010-12-25 | Disposition: A | Discharge status: Home / Self Care | Payer: Self-pay | Source: Ambulatory Visit | Attending: Obstetrics and Gynecology | Admitting: Obstetrics and Gynecology

## 2010-12-25 ENCOUNTER — Ambulatory Visit: Payer: Self-pay

## 2010-12-25 VITALS — BP 120/82 | HR 90 | Temp 98.4°F | Ht 62.0 in | Wt 101.0 lb

## 2010-12-25 DIAGNOSIS — B009 Herpesviral infection, unspecified: Secondary | ICD-10-CM | POA: Insufficient documentation

## 2010-12-25 DIAGNOSIS — O009 Unspecified ectopic pregnancy without intrauterine pregnancy: Secondary | ICD-10-CM | POA: Insufficient documentation

## 2010-12-25 DIAGNOSIS — F3289 Other specified depressive episodes: Secondary | ICD-10-CM | POA: Insufficient documentation

## 2010-12-25 DIAGNOSIS — F329 Major depressive disorder, single episode, unspecified: Secondary | ICD-10-CM | POA: Insufficient documentation

## 2010-12-25 DIAGNOSIS — J069 Acute upper respiratory infection, unspecified: Secondary | ICD-10-CM

## 2010-12-25 DIAGNOSIS — Z21 Asymptomatic human immunodeficiency virus [HIV] infection status: Secondary | ICD-10-CM | POA: Insufficient documentation

## 2010-12-25 LAB — HCG, QUANTITATIVE, PREGNANCY: hCG, Beta Chain, Quant, S: 20 m[IU]/mL — ABNORMAL HIGH (ref <5)

## 2010-12-25 NOTE — Progress Notes (Signed)
Pt to MAU for BHCG. Denies pain or bleeding.

## 2010-12-25 NOTE — ED Provider Notes (Signed)
History     No chief complaint on file.  HPIAshley B Whitehead, 25 y.o.presents for weekly BHCGS for followup of ectopic pregnancy.  She was tx with MTX on 7/20.  She is HIV positive.      Past Medical History  Diagnosis Date  . HIV (human immunodeficiency virus infection)   . Depression   . Anemia   . Bronchitis   . HSV (herpes simplex virus) infection     Past Surgical History  Procedure Date  . Dilation and curettage of uterus     No family history on file.  History  Substance Use Topics  . Smoking status: Current Everyday Smoker -- 11.0 packs/day    Types: Cigarettes  . Smokeless tobacco: Never Used  . Alcohol Use: No    Allergies: No Known Allergies  Prescriptions prior to admission  Medication Sig Dispense Refill  . atazanavir (REYATAZ) 300 MG capsule Take 1 capsule (300 mg total) by mouth daily with breakfast. MUST TAKE WITH NORVIR  30 capsule  5  . clotrimazole-betamethasone (LOTRISONE) cream Apply 1 application topically 2 (two) times daily.       . cromolyn (OPTICROM) 4 % ophthalmic solution Place 1 drop into both eyes 4 (four) times daily.  10 mL  1  . emtricitabine-tenofovir (TRUVADA) 200-300 MG per tablet Take 1 tablet by mouth daily.        . famotidine (PEPCID) 20 MG tablet Take 1 tablet (20 mg total) by mouth 2 (two) times daily.  180 tablet  3  . naphazoline (CLEAR EYES) 0.012 % ophthalmic solution Place 2 drops into both eyes 4 (four) times daily.        . ritonavir (NORVIR) 100 MG TABS Take 1 tablet (100 mg total) by mouth daily. TAKE WITH REYATAZ  30 tablet  5    ROS Physical Exam   Last menstrual period 12/09/2010.  Physical Exam  Not indicated  Results for orders placed during the hospital encounter of 12/25/10 (from the past 24 hour(s))  HCG, QUANTITATIVE, PREGNANCY     Status: Abnormal   Collection Time   12/25/10  3:59 PM      Component Value Range   hCG, Beta Chain, Quant, S 20 (*) <5 (mIU/mL)   MAU Course  Procedures  MDM Patient  left prior to result.  Was instructed to return for weekly BHCG unless we called her.  I will not call her because the BHCG is 20.  Need to follow down to   Assessment and Plan  A;Followup BHCG for ectopic pregnancy with Methrotrexate  P:  Repeat BHCG in 1 week.  KEY,EVE M 12/25/2010, 4:16 PM

## 2010-12-25 NOTE — Patient Instructions (Addendum)
1.  Guaifenesin with dextromethorphan ( Robitussin-DM or equivalent ) 3 teaspoons every 4 hours as needed for cough.  2. Take Benadryl ( diphenhydramine ) 25 mg 1-2 tabs/caps at bedtime.  3. Zyrtec or  Equivalent ( cetirizine ) 10 mg by mouth daily.  4. Increase fluid intake and increase rest periods. 5.  May take ibuprofen  400-600 mg every 6 hours asneeded for discomfort.   Sinusitis Sinusitis is a sinus infection. Sinuses are air pockets in your face. Germs (bacteria) growing in a sinus can lead to infection. This can cause puffiness (swelling). It can also cause drainage from your sinuses. This can happen in one or more of your sinuses. HOME CARE  Only take medicine as told by your doctor.   Drink enough water and fluids to keep your pee (urine) clear or pale yellow.   Apply moist heat or ice packs for pain.   Use saline nasal sprays. The spray will wet the thick fluid in the nose. This can help the sinuses drain. You can find these sprays at a drugstore.  TREATMENT After an exam, your doctor may give:  Medicine (antibiotic) that kills the infection.   Medicine (decongestant) to shrink puffiness.  GET HELP RIGHT AWAY IF:  You or your child has a temperature by mouth above 102, not controlled by medicine.   Your baby is older than 3 months with a rectal temperature of 102 F (38.9 C) or higher.   Your baby is 68 months old or younger with a rectal temperature of 100.4 F (38 C) or higher.   The pain gets worse.   You or your child gets a very bad headache.   You or your child keeps throwing up (vomiting).   The face gets puffy.  MAKE SURE YOU:  Understand these instructions.   Will watch this condition.   Will get help right away if you or your child is not doing well or gets worse.  Document Released: 09/30/2007 Document Re-Released: 07/08/2009 [facility] Patient Information 2011 [place], [place].Bronchitis Bronchitis is a problem of the air tubes leading to your  lungs. This problem makes it hard for air to get in and out of the lungs. You may cough a lot because your air tubes are narrow. Going without care can cause lasting (chronic) bronchitis. HOME CARE  Drink enough water and fluids to keep the pee clear or pale yellow.   Use a cool mist humidifier.   If you smoke, quit smoking. If you keep smoking, the bronchitis might not get better.   Take medicine as told by your doctor.  CAUSES  Viral infections.   Germ (bacterial) infections.   Things that cause allergic reactions or allergies (allergens).   Pollutants.   Dust and mold.   Smoking.   Toxic chemicals.  GET HELP IF:  Coughing keeps you or your child awake.   You or your child has a temperature by mouth above 102.   Your baby is older than 3 months with a rectal temperature of 100.5 F (38.1 C) or higher for more than 1 day.   You or your child is wheezing.  GET HELP RIGHT AWAY IF:  You or your child becomes more sick or weak.   You or your child has a harder time breathing, starts wheezing, or gets short of breath.   You or your child coughs up blood.   Coughing lasts more than 2 weeks.   You or your child has a temperature by mouth  above 102, not controlled by medicine.   Your baby is older than 3 months with a rectal temperature of 102 F (38.9 C) or higher.   Your baby is 14 months old or younger with a rectal temperature of 100.4 F (38 C) or higher.  MAKE SURE YOU:  Understand these instructions.   Will watch this condition.   Will get help right away if you or your child is not doing well or gets worse.  Document Released: 09/30/2007 Document Re-Released: 07/08/2009 [facility] Patient Information 2011 [place], [place].

## 2011-01-08 NOTE — ED Provider Notes (Signed)
Agree with above note.  [name] 01/08/2011 8:36 AM

## 2011-01-12 ENCOUNTER — Other Ambulatory Visit: Payer: Self-pay | Admitting: *Deleted

## 2011-01-12 DIAGNOSIS — B20 Human immunodeficiency virus [HIV] disease: Secondary | ICD-10-CM

## 2011-01-12 MED ORDER — SULFAMETHOXAZOLE-TMP DS 800-160 MG PO TABS: 1.0000 | ORAL_TABLET | Freq: Every day | ORAL | Status: DC

## 2011-01-12 MED ORDER — RITONAVIR 100 MG PO TABS: 100.0000 mg | ORAL_TABLET | Freq: Every day | ORAL | Status: DC

## 2011-01-12 MED ORDER — ATAZANAVIR SULFATE 300 MG PO CAPS: 300.0000 mg | ORAL_CAPSULE | Freq: Every day | ORAL | Status: DC

## 2011-01-12 MED ORDER — EMTRICITABINE-TENOFOVIR DF 200-300 MG PO TABS: 1.0000 | ORAL_TABLET | Freq: Every day | ORAL | Status: DC

## 2011-01-20 LAB — URINALYSIS, ROUTINE W REFLEX MICROSCOPIC
Bilirubin Urine: NEGATIVE
Nitrite: NEGATIVE
Specific Gravity, Urine: 1.031 — ABNORMAL HIGH
pH: 6.5

## 2011-01-20 LAB — PREGNANCY, URINE: Preg Test, Ur: NEGATIVE

## 2011-01-26 LAB — T-HELPER CELL (CD4) - (RCID CLINIC ONLY)
CD4 % Helper T Cell: 6 — ABNORMAL LOW
CD4 T Cell Abs: 80 — ABNORMAL LOW

## 2011-02-02 LAB — T-HELPER CELL (CD4) - (RCID CLINIC ONLY): CD4 T Cell Abs: 80 — ABNORMAL LOW

## 2011-02-05 ENCOUNTER — Other Ambulatory Visit: Payer: Self-pay | Admitting: *Deleted

## 2011-02-05 DIAGNOSIS — B20 Human immunodeficiency virus [HIV] disease: Secondary | ICD-10-CM

## 2011-02-05 LAB — POCT PREGNANCY, URINE: Operator id: 235561

## 2011-02-05 MED ORDER — SULFAMETHOXAZOLE-TMP DS 800-160 MG PO TABS: 1.0000 | ORAL_TABLET | Freq: Every day | ORAL | Status: DC

## 2011-02-10 LAB — DIFFERENTIAL
Band Neutrophils: 0
Basophils Absolute: 0
Basophils Absolute: 0
Basophils Absolute: 0
Basophils Relative: 0
Basophils Relative: 0
Basophils Relative: 0
Basophils Relative: 1
Eosinophils Absolute: 0
Eosinophils Absolute: 0
Eosinophils Absolute: 0.4
Eosinophils Relative: 0
Eosinophils Relative: 0
Eosinophils Relative: 1
Lymphocytes Relative: 0 — ABNORMAL LOW
Lymphocytes Relative: 24
Lymphocytes Relative: 32
Lymphs Abs: 0.2 — ABNORMAL LOW
Lymphs Abs: 0.3 — ABNORMAL LOW
Monocytes Absolute: 0.3
Monocytes Relative: 0 — ABNORMAL LOW
Monocytes Relative: 0 — ABNORMAL LOW
Monocytes Relative: 13 — ABNORMAL HIGH
Monocytes Relative: 17 — ABNORMAL HIGH
Monocytes Relative: 21 — ABNORMAL HIGH
Neutro Abs: 0.2 — ABNORMAL LOW
Neutro Abs: 0.3 — ABNORMAL LOW
Neutro Abs: 0.3 — ABNORMAL LOW
Neutrophils Relative %: 73
nRBC: 0
nRBC: 0

## 2011-02-10 LAB — CBC
HCT: 27.4 — ABNORMAL LOW
HCT: 27.6 — ABNORMAL LOW
HCT: 28.2 — ABNORMAL LOW
HCT: 28.7 — ABNORMAL LOW
HCT: 28.8 — ABNORMAL LOW
HCT: 29.4 — ABNORMAL LOW
Hemoglobin: 10 — ABNORMAL LOW
Hemoglobin: 10.3 — ABNORMAL LOW
Hemoglobin: 8.8 — ABNORMAL LOW
Hemoglobin: 9.2 — ABNORMAL LOW
Hemoglobin: 9.4 — ABNORMAL LOW
Hemoglobin: 9.6 — ABNORMAL LOW
Hemoglobin: 9.8 — ABNORMAL LOW
Hemoglobin: 9.9 — ABNORMAL LOW
MCHC: 33.7
MCHC: 33.8
MCHC: 33.9
MCHC: 34
MCHC: 34
MCHC: 34.2
MCV: 84.8
MCV: 85.3
MCV: 85.8
MCV: 86.3
Platelets: 233
Platelets: 237
RBC: 3.22 — ABNORMAL LOW
RBC: 3.31 — ABNORMAL LOW
RBC: 3.39 — ABNORMAL LOW
RBC: 3.44 — ABNORMAL LOW
RBC: 3.56 — ABNORMAL LOW
RDW: 16.7 — ABNORMAL HIGH
RDW: 16.9 — ABNORMAL HIGH
RDW: 16.9 — ABNORMAL HIGH
RDW: 17.4 — ABNORMAL HIGH
RDW: 17.5 — ABNORMAL HIGH
WBC: 0.6 — CL
WBC: 0.8 — CL
WBC: 1 — CL
WBC: 2 — ABNORMAL LOW

## 2011-02-10 LAB — COMPREHENSIVE METABOLIC PANEL
ALT: 16
AST: 27
Albumin: 3.1 — ABNORMAL LOW
Alkaline Phosphatase: 55
BUN: 7
CO2: 22
Calcium: 8.5
Chloride: 106
Creatinine, Ser: 0.66
GFR calc Af Amer: >60
GFR calc non Af Amer: >60
Glucose, Bld: 80
Potassium: 3.6
Sodium: 135
Total Bilirubin: 0.9
Total Protein: 7.8

## 2011-02-10 LAB — P CARINII SMEAR DFA: Pneumocystis carinii DFA: NEGATIVE

## 2011-02-10 LAB — URINALYSIS, ROUTINE W REFLEX MICROSCOPIC
Bilirubin Urine: NEGATIVE
Glucose, UA: NEGATIVE
Hgb urine dipstick: NEGATIVE
Ketones, ur: NEGATIVE
Leukocytes, UA: NEGATIVE
Nitrite: NEGATIVE
Protein, ur: 100 — AB
Specific Gravity, Urine: 1.033 — ABNORMAL HIGH
Urobilinogen, UA: 1
pH: 6

## 2011-02-10 LAB — BASIC METABOLIC PANEL
BUN: 2 — ABNORMAL LOW
BUN: 3 — ABNORMAL LOW
BUN: 9
CO2: 20
CO2: 22
CO2: 22
CO2: 24
CO2: 25
CO2: 26
CO2: 28
Calcium: 7.5 — ABNORMAL LOW
Calcium: 7.8 — ABNORMAL LOW
Calcium: 8 — ABNORMAL LOW
Calcium: 8.5
Calcium: 8.6
Chloride: 100
Chloride: 100
Chloride: 102
Chloride: 103
Chloride: 105
Chloride: 113 — ABNORMAL HIGH
Creatinine, Ser: 0.48
Creatinine, Ser: 0.48
Creatinine, Ser: 0.58
Creatinine, Ser: 0.64
GFR calc Af Amer: >60
GFR calc Af Amer: >60
GFR calc Af Amer: >60
GFR calc Af Amer: >60
GFR calc non Af Amer: >60
GFR calc non Af Amer: >60
GFR calc non Af Amer: >60
GFR calc non Af Amer: >60
Glucose, Bld: 102 — ABNORMAL HIGH
Glucose, Bld: 111 — ABNORMAL HIGH
Glucose, Bld: 76
Glucose, Bld: 76
Glucose, Bld: 79
Glucose, Bld: 84
Potassium: 2.9 — ABNORMAL LOW
Potassium: 3.7
Potassium: 3.8
Potassium: 3.9
Potassium: 4
Potassium: 4.3
Sodium: 132 — ABNORMAL LOW
Sodium: 132 — ABNORMAL LOW
Sodium: 133 — ABNORMAL LOW
Sodium: 133 — ABNORMAL LOW
Sodium: 134 — ABNORMAL LOW
Sodium: 135
Sodium: 138

## 2011-02-10 LAB — I-STAT 8, (EC8 V) (CONVERTED LAB)
Acid-Base Excess: 1
BUN: 10
Bicarbonate: 24
Chloride: 103
Glucose, Bld: 98
HCT: 29 — ABNORMAL LOW
Hemoglobin: 9.9 — ABNORMAL LOW
Operator id: 270651
Potassium: 2.9 — ABNORMAL LOW
Sodium: 138
TCO2: 25
pCO2, Ven: 32.9 — ABNORMAL LOW
pH, Ven: 7.472 — ABNORMAL HIGH

## 2011-02-10 LAB — CULTURE, RESPIRATORY W GRAM STAIN: Culture: NORMAL

## 2011-02-10 LAB — AFB CULTURE WITH SMEAR (NOT AT ARMC)
Acid Fast Smear: NONE SEEN
Acid Fast Smear: NONE SEEN

## 2011-02-10 LAB — URINE CULTURE: Colony Count: 4000

## 2011-02-10 LAB — URINE MICROSCOPIC-ADD ON

## 2011-02-10 LAB — AFB CULTURE, BLOOD

## 2011-02-10 LAB — STREP A DNA PROBE: Group A Strep Probe: NEGATIVE

## 2011-02-10 LAB — VITAMIN B12: Vitamin B-12: 598 (ref 211–911)

## 2011-02-10 LAB — MICROSPORIDIA SPORE STAIN, FECES: Microsporidia Spore: NEGATIVE

## 2011-02-10 LAB — EXPECTORATED SPUTUM ASSESSMENT W GRAM STAIN, RFLX TO RESP C

## 2011-02-10 LAB — FUNGUS CULTURE, BLOOD

## 2011-02-10 LAB — POCT I-STAT CREATININE
Creatinine, Ser: 0.8
Operator id: 270651

## 2011-02-10 LAB — OVA AND PARASITE EXAMINATION: Ova and parasites: NONE SEEN

## 2011-02-10 LAB — POCT PREGNANCY, URINE
Operator id: 27065
Preg Test, Ur: NEGATIVE

## 2011-02-10 LAB — MAGNESIUM: Magnesium: 2.2

## 2011-02-10 LAB — FOLATE: Folate: >20

## 2011-02-10 LAB — CLOSTRIDIUM DIFFICILE EIA

## 2011-02-10 LAB — FERRITIN: Ferritin: 322 — ABNORMAL HIGH (ref 10–291)

## 2011-02-10 LAB — OCCULT BLOOD X 1 CARD TO LAB, STOOL: Fecal Occult Bld: NEGATIVE

## 2011-02-26 NOTE — ED Provider Notes (Signed)
Agree with above note.  [name] 02/26/2011 3:40 PM

## 2011-03-06 NOTE — Telephone Encounter (Signed)
Error

## 2011-03-10 ENCOUNTER — Encounter: Payer: Self-pay | Admitting: Internal Medicine

## 2011-03-10 ENCOUNTER — Ambulatory Visit (INDEPENDENT_AMBULATORY_CARE_PROVIDER_SITE_OTHER): Payer: Self-pay | Admitting: Internal Medicine

## 2011-03-10 VITALS — BP 116/74 | HR 61 | Temp 98.1°F | Ht 64.0 in | Wt 107.5 lb

## 2011-03-10 DIAGNOSIS — R21 Rash and other nonspecific skin eruption: Secondary | ICD-10-CM

## 2011-03-10 DIAGNOSIS — L259 Unspecified contact dermatitis, unspecified cause: Secondary | ICD-10-CM

## 2011-03-10 DIAGNOSIS — B20 Human immunodeficiency virus [HIV] disease: Secondary | ICD-10-CM

## 2011-03-10 MED ORDER — CLOTRIMAZOLE-BETAMETHASONE 1-0.05 % EX CREA: 1.0000 "application " | TOPICAL_CREAM | Freq: Two times a day (BID) | CUTANEOUS | Status: DC

## 2011-03-10 MED ORDER — HYDROXYZINE HCL 10 MG PO TABS: 10.0000 mg | ORAL_TABLET | Freq: Three times a day (TID) | ORAL | Status: AC | PRN

## 2011-03-10 MED ORDER — PERMETHRIN 5 % EX CREA: TOPICAL_CREAM | Freq: Once | CUTANEOUS | Status: AC

## 2011-03-10 NOTE — Progress Notes (Signed)
  Subjective:    Patient ID: [name], female    DOB: 10/05/1985, 25 y.o.   MRN: [id number]  HPI the patient comes in today with complaint of itching on her chest. She states that over last several days she's had intense itching which has made her scratch including the into her chest pain due to the severe pruritus. She tells me she never had anything like this before. Her partner does not have anything like this. She also is noted on her neck as well. No fever or chills related to this. She otherwise states been taking her medicine well. Since her last visit she did have an ectopic pregnancy but is doing well recovering from that mentally and physically.    Review of Systems  Constitutional: Negative for fever, fatigue and unexpected weight change.  Eyes: Negative for discharge and itching.  Skin: Positive for rash. Negative for pallor.  Hematological: Negative for adenopathy.  Psychiatric/Behavioral: Negative for sleep disturbance.       Objective:   Physical Exam  Constitutional: She appears well-developed and well-nourished. No distress.  Cardiovascular: Normal rate, regular rhythm and normal heart sounds.   No murmur heard. Pulmonary/Chest: Effort normal and breath sounds normal. She has no wheezes.  Lymphadenopathy:    She has no cervical adenopathy.  Skin:       + papillary lesions over chest, pruritic, not confluent  Psychiatric: She has a normal mood and affect. Her behavior is normal.          Assessment & Plan:

## 2011-03-10 NOTE — Assessment & Plan Note (Signed)
This rash does appear more consistent with bites such as flea or bedbugs or other types of mites. I have given her a prescription for permethrin and given her instructions to put that on from her neck to her toes and then washing her she is to see if that limits the problem. Her partner though does not get any bites and he does share her bed though making mites less likely. Nonetheless so I will try to that as well as symptomatic therapy for the pruritus. Otherwise repairs for HIV she will return in a few weeks time for labs and then a follow up appointment with her primary provider.

## 2011-03-24 ENCOUNTER — Other Ambulatory Visit: Payer: Self-pay | Admitting: *Deleted

## 2011-03-24 NOTE — Telephone Encounter (Signed)
States she cannot afford Lotrisone. [employer] no longer pays for it. Wants to know if md can rx something on the $4 list at a local pharmacy. Her cell is [phone]

## 2011-03-25 NOTE — Telephone Encounter (Signed)
She can try OTC hydrocortisone.  Nothing on th 4$ list

## 2011-03-26 ENCOUNTER — Other Ambulatory Visit: Payer: Self-pay | Admitting: *Deleted

## 2011-03-26 DIAGNOSIS — L259 Unspecified contact dermatitis, unspecified cause: Secondary | ICD-10-CM

## 2011-03-26 MED ORDER — CLOTRIMAZOLE-BETAMETHASONE 1-0.05 % EX CREA: 1.0000 "application " | TOPICAL_CREAM | Freq: Two times a day (BID) | CUTANEOUS | Status: AC

## 2011-03-26 NOTE — Telephone Encounter (Signed)
I left her a message to call back

## 2011-04-02 ENCOUNTER — Ambulatory Visit: Payer: Self-pay

## 2011-04-06 ENCOUNTER — Ambulatory Visit: Payer: Self-pay

## 2011-06-09 DIAGNOSIS — J069 Acute upper respiratory infection, unspecified: Secondary | ICD-10-CM | POA: Insufficient documentation

## 2011-06-09 DIAGNOSIS — R350 Frequency of micturition: Secondary | ICD-10-CM | POA: Insufficient documentation

## 2011-06-09 DIAGNOSIS — H1010 Acute atopic conjunctivitis, unspecified eye: Secondary | ICD-10-CM | POA: Insufficient documentation

## 2011-06-09 NOTE — Assessment & Plan Note (Signed)
She is just having increased urinary frequency without dysuria, change in urine color or odor, hematuria, or any other associated symptoms relative to a UTI. Urine dipstick was negative. We recommend that she increase her fluid intake, not decrease it and if symptoms persist or new symptoms develop, we will run a routine urinalysis, and, perhaps give her empiric antibiotic coverage. Verbally acknowledged this information and agreed with plan of care.

## 2011-06-09 NOTE — Progress Notes (Signed)
Sick Call  Subjective: [name] is an 26 y.o. female.   Patient complains of 5-7 days of coryza, congestion, sneezing, nasal blockage, post nasal drip, productive cough, cough described as barky, headache, itching in eyes and chills.  Objective: HEENT: mucosal irritation and Postnasal drainage noted Cardio: RRR Resp: CTA B/L GI: BS positive Skin:   Intact Neuro: Alert/Oriented  Assessment: viral upper respiratory illness  Plan: Guaifenesin/Dextromethorphan15 mL every 4 hours when necessary  Ibuprofen. 600 mg every 6-8 hours when necessary , fever, and discomfort  Diphenhydramine.25-50 mg by mouth each bedtime when necessary  Cetirizine 10 mg by mouth every morning Bed rest for at least the next 2 days. Increase fluid intake. Warm packs over sinuses if needed. Proper nutrition with a least 3 meals a day. Contact clinic or go to urgent care. If symptoms worsen over the next 5 days or do not improve in the next 7 days. We will add cromolyn eyedrops.   [name] A. Sundra Aland, MS, [facility] [phone]  06/09/2011, 11:08 AM

## 2011-06-09 NOTE — Progress Notes (Signed)
Subjective:  Four-day history of increased urinary frequency without dysuria, hematuria, color change, quantity, or odor. Denies flank pain. Denies fever. Denies any vaginal discharge. Also is complaining of red and irritated eyes, especially while outside. Some excessive tearing, but improves when she goes back inside.  Review of Systems - see history of present illness  Objective:  Eyes appear red and tearing, conjunctiva does not demonstrate any purulent drainage, or inflammation. No CVA tenderness. Abdomen is benign.  Assessment/Plan:  Frequency of urination She is just having increased urinary frequency without dysuria, change in urine color or odor, hematuria, or any other associated symptoms relative to a UTI. Urine dipstick was negative. We recommend that she increase her fluid intake, not decrease it and if symptoms persist or new symptoms develop, we will run a routine urinalysis, and, perhaps give her empiric antibiotic coverage. Verbally acknowledged this information and agreed with plan of care.  Allergic conjunctivitis We will try cromolyn eyedrops 1 drop each eye every 4 hours during the day.      Rejoice Heatwole A. Sundra Aland, MS, [facility] for Infectious Disease [phone]  06/09/2011,  11:44 AM

## 2011-06-09 NOTE — Assessment & Plan Note (Addendum)
We'll discontinue the Darunavir/ritonavir, and began atazanavir with ritonavir, and continue the, Truvada. Repeat labs in 4 weeks with a followup in 6. Medication regimen,drug effects, treatment limitations, side effects, ADRs, and potential toxicities discussed in detail. Medication adherence, its importance in therapy and resistance discussed with patient. Counseling provided on prevention of transmission of HIV. Condoms offered:  Yes Referrals: Case Management Follow up visit in 6 weeks with labs 2 weeks prior to appointment. Patient verbally acknowledged information provided to them and agreed with plan of care.

## 2011-06-09 NOTE — Assessment & Plan Note (Signed)
We will try cromolyn eyedrops 1 drop each eye every 4 hours during the day.

## 2011-06-09 NOTE — Assessment & Plan Note (Signed)
Guaifenesin/Dextromethorphan15 mL every 4 hours when necessary  Ibuprofen. 600 mg every 6-8 hours when necessary , fever, and discomfort  Diphenhydramine.25-50 mg by mouth each bedtime when necessary  Cetirizine 10 mg by mouth every morning Bed rest for at least the next 2 days. Increase fluid intake. Warm packs over sinuses if needed. Proper nutrition with a least 3 meals a day. Contact clinic or go to urgent care. If symptoms worsen over the next 5 days or do not improve in the next 7 days. We will add a prescription for cromolyn eyedrops.

## 2011-06-09 NOTE — Progress Notes (Signed)
Patient ID: [name], female   DOB: March 03, 1986, 26 y.o.   MRN: [id number] Recently, was being diagnosed as pregnant by the [facility]. She is in the high-risk clinic. However, her regimen of Prezista, with ritonavir, and Truvada, need evaluation. After careful evaluation and discussion with her and treatment regimen. Change, is a normal.  Pregnancy and infectious disease We'll discontinue the Darunavir/ritonavir, and began atazanavir with ritonavir, and continue the, Truvada. Repeat labs in 4 weeks with a followup in 6. Medication regimen,drug effects, treatment limitations, side effects, ADRs, and potential toxicities discussed in detail. Medication adherence, its importance in therapy and resistance discussed with patient. Counseling provided on prevention of transmission of HIV. Condoms offered:  Yes Referrals: Case Management Follow up visit in 6 weeks with labs 2 weeks prior to appointment. Patient verbally acknowledged information provided to them and agreed with plan of care.

## 2011-06-11 ENCOUNTER — Encounter: Payer: Self-pay | Admitting: Adult Health

## 2011-06-11 ENCOUNTER — Other Ambulatory Visit: Payer: Self-pay | Admitting: *Deleted

## 2011-06-11 ENCOUNTER — Other Ambulatory Visit: Payer: Self-pay

## 2011-06-11 ENCOUNTER — Ambulatory Visit (INDEPENDENT_AMBULATORY_CARE_PROVIDER_SITE_OTHER): Payer: Self-pay | Admitting: Adult Health

## 2011-06-11 DIAGNOSIS — B20 Human immunodeficiency virus [HIV] disease: Secondary | ICD-10-CM

## 2011-06-11 DIAGNOSIS — R636 Underweight: Secondary | ICD-10-CM | POA: Insufficient documentation

## 2011-06-11 DIAGNOSIS — Z23 Encounter for immunization: Secondary | ICD-10-CM

## 2011-06-11 DIAGNOSIS — R05 Cough: Secondary | ICD-10-CM

## 2011-06-11 DIAGNOSIS — R053 Chronic cough: Secondary | ICD-10-CM | POA: Insufficient documentation

## 2011-06-11 LAB — CBC WITH DIFFERENTIAL/PLATELET
Basophils Absolute: 0 10*3/uL (ref 0.0–0.1)
HCT: 42.1 % (ref 36.0–46.0)
Lymphocytes Relative: 31 % (ref 12–46)
Monocytes Absolute: 0.4 10*3/uL (ref 0.1–1.0)
Neutro Abs: 3.8 10*3/uL (ref 1.7–7.7)
Neutrophils Relative %: 61 % (ref 43–77)
Platelets: 292 10*3/uL (ref 150–400)
RDW: 13.1 % (ref 11.5–15.5)
WBC: 6.1 10*3/uL (ref 4.0–10.5)

## 2011-06-11 LAB — COMPLETE METABOLIC PANEL WITH GFR
Albumin: 4.6 g/dL (ref 3.5–5.2)
Alkaline Phosphatase: 67 U/L (ref 39–117)
BUN: 10 mg/dL (ref 6–23)
GFR, Est African American: >89 mL/min
GFR, Est Non African American: >89 mL/min
Glucose, Bld: 75 mg/dL (ref 70–99)
Potassium: 4.2 mEq/L (ref 3.5–5.3)

## 2011-06-11 MED ORDER — ENSURE PO LIQD: 237.0000 mL | Freq: Two times a day (BID) | ORAL | Status: DC

## 2011-06-11 NOTE — Patient Instructions (Signed)
Guaifenesin/Dextromethorphan (Robitussin DM) 15 mL (3 teaspoons) every 4 hours when necessary  Ibuprofen. 600 mg every 6-8 hours when necessary , fever, and discomfort  Diphenhydramine.25-50 mg by mouth each bedtime when necessary  Cetirizine 10 mg by mouth every morning Increase fluid intake. Proper nutrition with a least 3 meals a day.  Ensure 1 can three times daily

## 2011-06-11 NOTE — Assessment & Plan Note (Addendum)
BMI approximately 17 Ensure 1 can tid

## 2011-06-11 NOTE — Assessment & Plan Note (Signed)
Most likely associated with smoking (1 ppd). Smoking cessation discussed. Guaifenesin/Dextromethorphan15 mL every 4 hours when necessary  Ibuprofen. 600 mg every 6-8 hours when necessary , fever, and discomfort  Diphenhydramine.25-50 mg by mouth each bedtime when necessary  Cetirizine 10 mg by mouth every morning Increase fluid intake. Proper nutrition with a least 3 meals a day.

## 2011-06-11 NOTE — Progress Notes (Signed)
Subjective:  Chronic cough for 4 months. Occurs mostly at night with a brownish type sputum. States over that same period of time has increased. The amount of smoking. She does them changed brands of cigarettes, but did not relate to a nighttime cough with her smoking. Denies, fevers, chills, sweats, dyspnea on exertion, orthopnea. Also, claims to continue with being underweight. Was on Ensure in the past, but was having problems taking it as she did not like the smell. However, she is willing to try again if she could use a straw. Claims he is adherent with her HIV therapies without missed doses or complications.  Review of Systems - General ROS: As per history of present illness Psychological ROS: negative for - anxiety, behavioral disorder, concentration difficulties, depression, irritability, memory difficulties or mood swings Ophthalmic ROS: negative Respiratory ROS: As per history of present illness Cardiovascular ROS: no chest pain or dyspnea on exertion Gastrointestinal ROS: no abdominal pain, change in bowel habits, or black or bloody stools Genito-Urinary ROS: no dysuria, trouble voiding, or hematuria Neurological ROS: no TIA or stroke symptoms Dermatological ROS: negative   Objective:    General appearance: alert, cooperative, cachectic and no distress Head: Normocephalic, without obvious abnormality, atraumatic Eyes: conjunctivae/corneas clear. PERRL, EOM's intact. Fundi benign. Ears: normal TM's and external ear canals both ears Throat: lips, mucosa, and tongue normal; teeth and gums normal Resp: clear to auscultation bilaterally Cardio: regular rate and rhythm, S1, S2 normal, no murmur, click, rub or gallop GI: soft, non-tender; bowel sounds normal; no masses,  no organomegaly Extremities: extremities normal, atraumatic, no cyanosis or edema Skin: Skin color, texture, turgor normal. No rashes or lesions Neurologic: Alert and oriented X 3, normal strength and tone. Normal  symmetric reflexes. Normal coordination and gait Psych:  No vegetative signs or delusional behaviors noted.     Assessment/Plan:  Chronic cough Most likely associated with smoking (1 ppd). Smoking cessation discussed. Guaifenesin/Dextromethorphan15 mL every 4 hours when necessary  Ibuprofen. 600 mg every 6-8 hours when necessary , fever, and discomfort  Diphenhydramine.25-50 mg by mouth each bedtime when necessary  Cetirizine 10 mg by mouth every morning Increase fluid intake. Proper nutrition with a least 3 meals a day.    Underweight BMI approximately 17 Ensure 1 can tid  HIV DISEASE No labs since August 2012. Counseling provided on prevention of transmission of HIV. Condoms offered:  Yes Medication adherence discussed with patient. Medication refills ordered as needed. Referrals: Case Management Follow up visit in 2 weeks with labs today.  Patient verbally acknowledged information provided to them and agreed with plan of care.         Cammeron Greis A. Sundra Aland, MS, [facility] for Infectious Disease [phone]  06/11/2011,  12:04 PM

## 2011-06-11 NOTE — Assessment & Plan Note (Signed)
No labs since August 2012. Counseling provided on prevention of transmission of HIV. Condoms offered:  Yes Medication adherence discussed with patient. Medication refills ordered as needed. Referrals: Case Management Follow up visit in 2 weeks with labs today.  Patient verbally acknowledged information provided to them and agreed with plan of care.

## 2011-06-12 LAB — T-HELPER CELL (CD4) - (RCID CLINIC ONLY)
CD4 % Helper T Cell: 21 % — ABNORMAL LOW (ref 33–55)
CD4 T Cell Abs: 400 uL (ref 400–2700)

## 2011-06-14 ENCOUNTER — Encounter (HOSPITAL_COMMUNITY): Payer: Self-pay | Admitting: *Deleted

## 2011-06-14 ENCOUNTER — Emergency Department (HOSPITAL_COMMUNITY): Payer: Self-pay

## 2011-06-14 ENCOUNTER — Other Ambulatory Visit: Payer: Self-pay

## 2011-06-14 ENCOUNTER — Emergency Department (HOSPITAL_COMMUNITY)
Admission: EM | Admit: 2011-06-14 | Discharge: 2011-06-14 | Disposition: A | Discharge status: Home / Self Care | Payer: Self-pay | Attending: Emergency Medicine | Admitting: Emergency Medicine

## 2011-06-14 DIAGNOSIS — R61 Generalized hyperhidrosis: Secondary | ICD-10-CM | POA: Insufficient documentation

## 2011-06-14 DIAGNOSIS — F3289 Other specified depressive episodes: Secondary | ICD-10-CM | POA: Insufficient documentation

## 2011-06-14 DIAGNOSIS — R079 Chest pain, unspecified: Secondary | ICD-10-CM | POA: Insufficient documentation

## 2011-06-14 DIAGNOSIS — R0602 Shortness of breath: Secondary | ICD-10-CM | POA: Insufficient documentation

## 2011-06-14 DIAGNOSIS — J40 Bronchitis, not specified as acute or chronic: Secondary | ICD-10-CM | POA: Insufficient documentation

## 2011-06-14 DIAGNOSIS — F329 Major depressive disorder, single episode, unspecified: Secondary | ICD-10-CM | POA: Insufficient documentation

## 2011-06-14 DIAGNOSIS — Z79899 Other long term (current) drug therapy: Secondary | ICD-10-CM | POA: Insufficient documentation

## 2011-06-14 DIAGNOSIS — R42 Dizziness and giddiness: Secondary | ICD-10-CM | POA: Insufficient documentation

## 2011-06-14 DIAGNOSIS — Z21 Asymptomatic human immunodeficiency virus [HIV] infection status: Secondary | ICD-10-CM | POA: Insufficient documentation

## 2011-06-14 DIAGNOSIS — R059 Cough, unspecified: Secondary | ICD-10-CM | POA: Insufficient documentation

## 2011-06-14 DIAGNOSIS — R0989 Other specified symptoms and signs involving the circulatory and respiratory systems: Secondary | ICD-10-CM | POA: Insufficient documentation

## 2011-06-14 DIAGNOSIS — R0609 Other forms of dyspnea: Secondary | ICD-10-CM | POA: Insufficient documentation

## 2011-06-14 DIAGNOSIS — R6883 Chills (without fever): Secondary | ICD-10-CM | POA: Insufficient documentation

## 2011-06-14 DIAGNOSIS — R000 Tachycardia, unspecified: Secondary | ICD-10-CM | POA: Insufficient documentation

## 2011-06-14 DIAGNOSIS — R05 Cough: Secondary | ICD-10-CM | POA: Insufficient documentation

## 2011-06-14 LAB — CBC
Hemoglobin: 14.5 g/dL (ref 12.0–15.0)
MCHC: 36.6 g/dL — ABNORMAL HIGH (ref 30.0–36.0)
RDW: 13 % (ref 11.5–15.5)
WBC: 5.3 10*3/uL (ref 4.0–10.5)

## 2011-06-14 MED ORDER — DOXYCYCLINE HYCLATE 100 MG PO CAPS: 100.0000 mg | ORAL_CAPSULE | Freq: Two times a day (BID) | ORAL | Status: AC

## 2011-06-14 MED ORDER — HYDROCODONE-ACETAMINOPHEN 5-500 MG PO TABS: 1.0000 | ORAL_TABLET | Freq: Four times a day (QID) | ORAL | Status: AC | PRN

## 2011-06-14 NOTE — Discharge Instructions (Signed)
-Continue cough syrup with vicodin for cough management.  Complete course of antibiotics (doxycycline) for 7d.   Bronchitis Bronchitis is a problem of the air tubes leading to your lungs. This problem makes it hard for air to get in and out of the lungs. You may cough a lot because your air tubes are narrow. Going without care can cause lasting (chronic) bronchitis. HOME CARE   Drink enough fluids to keep your pee (urine) clear or pale yellow.   Use a cool mist humidifier.   Quit smoking if you smoke. If you keep smoking, the bronchitis might not get better.   Only take medicine as told by your doctor.  GET HELP RIGHT AWAY IF:   Coughing keeps you awake.   You start to wheeze.   You become more sick or weak.   You have a hard time breathing or get short of breath.   You cough up blood.   Coughing lasts more than 2 weeks.   You have a fever.   Your baby is older than 3 months with a rectal temperature of 102 F (38.9 C) or higher.   Your baby is 37 months old or younger with a rectal temperature of 100.4 F (38 C) or higher.  MAKE SURE YOU:  Understand these instructions.   Will watch your condition.   Will get help right away if you are not doing well or get worse.  Document Released: 09/30/2007 Document Revised: 12/24/2010 Document Reviewed: 03/15/2009 [facility] [place] [facility] Patient Information 2012 [place], [place].  You Can Quit Smoking If you are ready to quit smoking or are thinking about it, congratulations! You have chosen to help yourself be healthier and live longer! There are lots of different ways to quit smoking. Nicotine gum, nicotine patches, a nicotine inhaler, or nicotine nasal spray can help with physical craving. Hypnosis, support groups, and medicines help break the habit of smoking. TIPS TO GET OFF AND STAY OFF CIGARETTES  Learn to predict your moods. Do not let a bad situation be your excuse to have a cigarette. Some situations in your life might tempt you to have  a cigarette.   Ask friends and co-workers not to smoke around you.   Make your home smoke-free.   Never have "just one" cigarette. It leads to wanting another and another. Remind yourself of your decision to quit.   On a card, make a list of your reasons for not smoking. Read it at least the same number of times a day as you have a cigarette. Tell yourself everyday, "I do not want to smoke. I choose not to smoke."   Ask someone at home or work to help you with your plan to quit smoking.   Have something planned after you eat or have a cup of coffee. Take a walk or get other exercise to perk you up. This will help to keep you from overeating.   Try a relaxation exercise to calm you down and decrease your stress. Remember, you may be tense and nervous the first two weeks after you quit. This will pass.   Find new activities to keep your hands busy. Play with a pen, coin, or rubber band. Doodle or draw things on paper.   Brush your teeth right after eating. This will help cut down the craving for the taste of tobacco after meals. You can try mouthwash too.   Try gum, breath mints, or diet candy to keep something in your mouth.  IF YOU SMOKE AND  WANT TO QUIT:  Do not stock up on cigarettes. Never buy a carton. Wait until one pack is finished before you buy another.   Never carry cigarettes with you at work or at home.   Keep cigarettes as far away from you as possible. Leave them with someone else.   Never carry matches or a lighter with you.   Ask yourself, "Do I need this cigarette or is this just a reflex?"   Bet with someone that you can quit. Put cigarette money in a piggy bank every morning. If you smoke, you give up the money. If you do not smoke, by the end of the week, you keep the money.   Keep trying. It takes 21 days to change a habit!   Talk to your doctor about using medicines to help you quit. These include nicotine replacement gum, lozenges, or skin patches.    Document Released: 02/07/2009 Document Revised: 12/24/2010 Document Reviewed: 02/07/2009 [facility] Patient Information 2012 [place], [place].  RESOURCE GUIDE  Dental Problems  Patients with Medicaid: [facility] [phone] [address]                                           [phone] [address] Phone:  [phone]                                                  Phone:  [phone]  If unable to pay or uninsured, contact:  [facility] or [facility]. to become qualified for the adult dental clinic.  Chronic Pain Problems Contact [facility]  [phone] Patients need to be referred by their primary care doctor.  Insufficient Money for Medicine Contact [employer] Way:  call "211" or [facility] [phone].  No Primary Care Doctor Call [facility]  ([phone] Other agencies that provide inexpensive medical care    [facility]  [phone]    [facility]  [phone]    [facility]  [phone]    [facility] [place] [facility]  [phone]    [facility]anned Parenthood  2070049798    [facility]  [phone]  Psychological Services [facility]  [phone] [facility]  [phone] [facility]   [phone] (emergency services [phone])  Substance Abuse Resources [facility]  ([phone] [facility] [phone] [facility] [place] ([phone] Floyd[facility] Flock (973)024-7920 Residential & Outpatient Substance Abuse Program  [phone]  Abuse/Neglect [facility] Child Abuse Hotline 202-801-8312 [facility] Child Abuse Hotline 916-812-7945 (After Hours)  Emergency Shelter [facility] 763-424-0607  Maternity Homes [facility] at the [place] [facility] [place] ([phone] [facility] [phone]   [facility] Resources  [facility] [place] [facility]     United Way                           [facility] Dept. [address] [place] Phone:  [phone]                                   Phone:  [phone]                 Phone:  [place] Phone:  [place] [phone] (After Hours)

## 2011-06-14 NOTE — ED Notes (Signed)
Pt c/o midsternal chest pain x1wk. Pt states "it'll hurt for like a week then go away and then start back."Pt states shes also having SOB with exertion x7mo and cough x32mo.  Pt states she coughs up brown mucus and this AM she coughed up blood tinged sputum.

## 2011-06-14 NOTE — ED Notes (Signed)
Reports having mid chest pains for extended amount of time, having productive cough and this am spit up blood. ekg done at triage, no resp distress noted.

## 2011-06-14 NOTE — ED Provider Notes (Signed)
History     CSN: [id number]  Arrival date & time 06/14/11  1120   First MD Initiated Contact with Patient 06/14/11 1139      Chief Complaint  Patient presents with  . Chest Pain  . Cough     HPI 26 yo F with PMH significant for HIV (CD4 count = 400, 06/11/11) p/w with 3-4 weeks of brown sputum producing cough that has remained unchanged until today, when she produced blood streaked sputum.  Cough is worsened with smoking and at night, and better with robitussin.  Sick contacts include her niece with dry cough x 1 week.  Denies fever, but endorses chills/sweats.  Cough is accompanied by DOE (becomes SOB when carrying niece across room), but no SOB at rest.  Also c/o mid sternal CP that radiates to the left side, intermittent, sharp.  Has recently been moving her television around.  Also c/o dizziness with standing quickly or if standing for long periods while cooking.  Describes dizziness as room spinning, denies tinnitus.  No N/V/constipation/diarrhea/palpitations.   Patient was recently seen for brown sputum producing cough in clinic by Talmadge Chad, NP on 06/11/11, thought to be smoker's cough. Instructed to try Benedryl/ibuprofen as needed and cetirizine daily, but she has not tried this regimen.   Past Medical History  Diagnosis Date  . HIV (human immunodeficiency virus infection)   . Depression   . Anemia   . Bronchitis   . HSV (herpes simplex virus) infection     Past Surgical History  Procedure Date  . Dilation and curettage of uterus     History reviewed. No pertinent family history.  History  Substance Use Topics  . Smoking status: Current Everyday Smoker -- 0.5 packs/day for 7 years    Types: Cigarettes  . Smokeless tobacco: Never Used  . Alcohol Use: No    OB History    Grav Para Term Preterm Abortions TAB SAB Ect Mult Living   3 1 1  1   1  1       Review of Systems  Constitutional: Negative for fever, chills, diaphoresis, activity change, appetite  change and fatigue.  HENT: Negative for sore throat, facial swelling, trouble swallowing, neck pain and neck stiffness.   Eyes: Negative.   Respiratory: Positive for cough and shortness of breath. Negative for chest tightness and wheezing.   Cardiovascular: Negative.   Gastrointestinal: Negative.   Genitourinary: Negative.   Musculoskeletal: Negative.   Skin: Negative.   Neurological: Negative.   Psychiatric/Behavioral: Negative.     Allergies  Review of patient's allergies indicates no known allergies.  Home Medications   Current Outpatient Rx  Name Route Sig Dispense Refill  . ATAZANAVIR SULFATE 300 MG PO CAPS Oral Take 1 capsule (300 mg total) by mouth daily with breakfast. MUST TAKE WITH NORVIR 30 capsule 5  . EMTRICITABINE-TENOFOVIR 200-300 MG PO TABS Oral Take 1 tablet by mouth daily. 30 tablet 5  . RITONAVIR 100 MG PO TABS Oral Take 1 tablet (100 mg total) by mouth daily. TAKE WITH REYATAZ 30 tablet 5    BP 136/98  Pulse 105  Temp(Src) 98.3 F (36.8 C) (Oral)  Resp 18  Ht 5\' 4"  (1.626 m)  Wt 105 lb (47.628 kg)  BMI 18.02 kg/m2  SpO2 95%  LMP 05/27/2011  Physical Exam  Constitutional: She is oriented to person, place, and time. She appears well-developed and well-nourished. No distress.  HENT:  Mouth/Throat: Oropharynx is clear and moist.  Eyes: Conjunctivae and EOM  are normal. Pupils are equal, round, and reactive to light.  Neck: Normal range of motion. Neck supple. No tracheal deviation present. No thyromegaly present.  Cardiovascular: Regular rhythm, normal heart sounds and intact distal pulses.  Exam reveals no gallop and no friction rub.   No murmur heard.      tachycardic  Pulmonary/Chest: Effort normal and breath sounds normal. No respiratory distress. She has no wheezes. She has no rales. She exhibits no tenderness.  Abdominal: Soft. Bowel sounds are normal. She exhibits no distension and no mass. There is no tenderness. There is no rebound and no  guarding.  Musculoskeletal: Normal range of motion. She exhibits no edema and no tenderness.  Lymphadenopathy:    She has no cervical adenopathy.  Neurological: She is alert and oriented to person, place, and time. No cranial nerve deficit. Coordination normal.  Skin: Skin is warm and dry. No rash noted. She is not diaphoretic. No erythema.  Psychiatric: She has a normal mood and affect. Her behavior is normal.    ED Course  Procedures (including critical care time)  Labs Reviewed  CBC - Abnormal; Notable for the following:    MCH 34.4 (*)    MCHC 36.6 (*)    All other components within normal limits   Dg Chest 2 View  06/14/2011  *RADIOLOGY REPORT*  Clinical Data: Cough, chest pain.  CHEST - 2 VIEW  Comparison: 08/23/2010  Findings: Lungs clear.  Heart size and pulmonary vascularity normal.  No effusion.  Visualized bones unremarkable.  IMPRESSION: No acute disease  Original Report Authenticated By: Osa Craver, M.D.     1. Bronchitis       MDM  26 yo F with PMH of HIV and bronchitis p/w chronic sputum producing cough.  CXR does not suggest PNA.  No acute drop in Hb.   Will treat as bronchitis with doxy 100 BID x 7d with vicodin #20 to take with OTC cough syrup.   Encouraged smoking cessation.

## 2011-06-14 NOTE — ED Notes (Signed)
Pt d/c home in NAD. Pt given d/c instructions and voiced understanding of information.

## 2011-06-19 ENCOUNTER — Other Ambulatory Visit: Payer: Self-pay | Admitting: *Deleted

## 2011-06-19 DIAGNOSIS — B20 Human immunodeficiency virus [HIV] disease: Secondary | ICD-10-CM

## 2011-06-19 MED ORDER — EMTRICITABINE-TENOFOVIR DF 200-300 MG PO TABS: 1.0000 | ORAL_TABLET | Freq: Every day | ORAL | Status: DC

## 2011-06-19 MED ORDER — RITONAVIR 100 MG PO TABS: 100.0000 mg | ORAL_TABLET | Freq: Every day | ORAL | Status: DC

## 2011-06-19 MED ORDER — ATAZANAVIR SULFATE 300 MG PO CAPS: 300.0000 mg | ORAL_CAPSULE | Freq: Every day | ORAL | Status: DC

## 2011-06-23 NOTE — ED Provider Notes (Signed)
Evaluation and management procedures were performed by the resident physician under my supervision/collaboration.   Felisa Bonier, MD 06/23/11 2013

## 2011-06-25 ENCOUNTER — Ambulatory Visit: Payer: Self-pay | Admitting: Adult Health

## 2011-06-25 ENCOUNTER — Ambulatory Visit: Payer: Self-pay | Admitting: Internal Medicine

## 2011-07-22 ENCOUNTER — Telehealth: Payer: Self-pay

## 2011-07-22 NOTE — Telephone Encounter (Signed)
CALLED PATIENT TO RENEW ADAP AND RW - WASN'T SURE IF [facility] HAD DONE - LEFT MESSAGE FOR HER TO CALL.

## 2011-08-10 ENCOUNTER — Ambulatory Visit: Payer: Self-pay | Admitting: Internal Medicine

## 2011-08-14 ENCOUNTER — Telehealth: Payer: Self-pay | Admitting: *Deleted

## 2011-08-14 ENCOUNTER — Ambulatory Visit: Payer: Self-pay

## 2011-08-14 ENCOUNTER — Ambulatory Visit: Payer: Self-pay | Admitting: Internal Medicine

## 2011-08-14 NOTE — Telephone Encounter (Signed)
Called patient to try and reschedule her appt and she did not answer nor was there a machine for me to leave a message on.  ADAP coordinator B. Jeraldine Loots was expecting her also and advised she has missed several with her so I will have a Paramedic reach out to her and see if they can help get her in care.

## 2011-08-17 ENCOUNTER — Ambulatory Visit: Payer: Self-pay

## 2011-08-17 ENCOUNTER — Encounter: Payer: Self-pay | Admitting: *Deleted

## 2011-08-17 ENCOUNTER — Encounter (HOSPITAL_COMMUNITY): Payer: Self-pay | Admitting: *Deleted

## 2011-08-17 ENCOUNTER — Emergency Department (HOSPITAL_COMMUNITY)
Admission: EM | Admit: 2011-08-17 | Discharge: 2011-08-17 | Disposition: A | Discharge status: Home / Self Care | Payer: Self-pay | Attending: Emergency Medicine | Admitting: Emergency Medicine

## 2011-08-17 DIAGNOSIS — F341 Dysthymic disorder: Secondary | ICD-10-CM | POA: Insufficient documentation

## 2011-08-17 DIAGNOSIS — F329 Major depressive disorder, single episode, unspecified: Secondary | ICD-10-CM

## 2011-08-17 DIAGNOSIS — B20 Human immunodeficiency virus [HIV] disease: Secondary | ICD-10-CM

## 2011-08-17 DIAGNOSIS — R45851 Suicidal ideations: Secondary | ICD-10-CM | POA: Insufficient documentation

## 2011-08-17 DIAGNOSIS — F32A Depression, unspecified: Secondary | ICD-10-CM

## 2011-08-17 DIAGNOSIS — Z79899 Other long term (current) drug therapy: Secondary | ICD-10-CM | POA: Insufficient documentation

## 2011-08-17 DIAGNOSIS — G479 Sleep disorder, unspecified: Secondary | ICD-10-CM | POA: Insufficient documentation

## 2011-08-17 LAB — COMPREHENSIVE METABOLIC PANEL
ALT: 16 U/L (ref 0–35)
AST: 20 U/L (ref 0–37)
Albumin: 4.1 g/dL (ref 3.5–5.2)
Alkaline Phosphatase: 66 U/L (ref 39–117)
CO2: 23 mEq/L (ref 19–32)
Chloride: 102 mEq/L (ref 96–112)
Creatinine, Ser: 0.7 mg/dL (ref 0.50–1.10)
GFR calc non Af Amer: >90 mL/min (ref >90)
Potassium: 3.6 mEq/L (ref 3.5–5.1)
Total Bilirubin: 0.9 mg/dL (ref 0.3–1.2)

## 2011-08-17 LAB — RAPID URINE DRUG SCREEN, HOSP PERFORMED
Amphetamines: NOT DETECTED
Barbiturates: NOT DETECTED
Opiates: NOT DETECTED
Tetrahydrocannabinol: POSITIVE — AB

## 2011-08-17 LAB — CBC
MCV: 95.6 fL (ref 78.0–100.0)
Platelets: 259 10*3/uL (ref 150–400)
RBC: 4.5 MIL/uL (ref 3.87–5.11)
WBC: 6.1 10*3/uL (ref 4.0–10.5)

## 2011-08-17 LAB — PREGNANCY, URINE: Preg Test, Ur: NEGATIVE

## 2011-08-17 LAB — POCT PREGNANCY, URINE: Preg Test, Ur: NEGATIVE

## 2011-08-17 MED ORDER — RITONAVIR 100 MG PO TABS
100.0000 mg | ORAL_TABLET | Freq: Every day | ORAL | Status: DC
  Administered 2011-08-17: 100 mg via ORAL
  Filled 2011-08-17 (×2): qty 1

## 2011-08-17 MED ORDER — ACETAMINOPHEN 325 MG PO TABS: 650.0000 mg | ORAL_TABLET | ORAL | Status: DC | PRN

## 2011-08-17 MED ORDER — EMTRICITABINE-TENOFOVIR DF 200-300 MG PO TABS
1.0000 | ORAL_TABLET | Freq: Every day | ORAL | Status: DC
  Administered 2011-08-17: 1 via ORAL
  Filled 2011-08-17 (×2): qty 1

## 2011-08-17 MED ORDER — ATAZANAVIR SULFATE 150 MG PO CAPS
300.0000 mg | ORAL_CAPSULE | Freq: Every day | ORAL | Status: DC
  Administered 2011-08-17: 300 mg via ORAL
  Filled 2011-08-17 (×3): qty 2

## 2011-08-17 MED ORDER — NICOTINE 21 MG/24HR TD PT24
21.0000 mg | MEDICATED_PATCH | Freq: Every day | TRANSDERMAL | Status: DC
  Administered 2011-08-17: 21 mg via TRANSDERMAL
  Filled 2011-08-17: qty 1

## 2011-08-17 MED ORDER — IBUPROFEN 200 MG PO TABS: 600.0000 mg | ORAL_TABLET | Freq: Three times a day (TID) | ORAL | Status: DC | PRN

## 2011-08-17 MED ORDER — SULFAMETHOXAZOLE-TMP DS 800-160 MG PO TABS
1.0000 | ORAL_TABLET | Freq: Every day | ORAL | Status: DC
  Administered 2011-08-17: 1 via ORAL
  Filled 2011-08-17: qty 1

## 2011-08-17 MED ORDER — ZOLPIDEM TARTRATE 5 MG PO TABS: 5.0000 mg | ORAL_TABLET | Freq: Every evening | ORAL | Status: DC | PRN

## 2011-08-17 MED ORDER — ONDANSETRON HCL 8 MG PO TABS: 4.0000 mg | ORAL_TABLET | Freq: Three times a day (TID) | ORAL | Status: DC | PRN

## 2011-08-17 MED ORDER — LORAZEPAM 1 MG PO TABS: 1.0000 mg | ORAL_TABLET | Freq: Three times a day (TID) | ORAL | Status: DC | PRN

## 2011-08-17 NOTE — ED Notes (Signed)
Meal tray ordered 

## 2011-08-17 NOTE — ED Notes (Signed)
Dinner Tray ordered 

## 2011-08-17 NOTE — ED Notes (Signed)
Pt changed into blue scrubs and wanded in triage.

## 2011-08-17 NOTE — Progress Notes (Signed)
Patient ID: [name], female   DOB: 1986/01/31, 26 y.o.   MRN: [id number] Pt came to [facility] verbalizing that she wanted to hurt herself.  Does not have a plan.  Pt verbalized that she does not have anyone, including her family, to talk to about her HIV status and concerns.  RN praised the patient for coming to [facility] this morning.  RN asked the pt whether she has attempted to hurt herself in the past and she responded that she had.  RN asked if she has ever obtained services from another agency in [place] and the pt stated that she has worked with [employer] in the past, Amy Faw, SW.  Ms Faw was at [facility] this AM and came to speak with the pt prior to being escorted to the [facility] ED.  Pt was given Amy Faw's business card and the [employer] business card to call if she needs to talk with someone at night or on the weekends.  RN escorted the pt to the [facility] ED.

## 2011-08-17 NOTE — ED Notes (Signed)
Sitter at bedside.

## 2011-08-17 NOTE — ED Notes (Signed)
Pt belongings placed in locker 3 
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
## 2011-08-17 NOTE — ED Provider Notes (Signed)
Pt was evaluated by psychiatry. Recommending discharge. Pt apparently with vague SI earlier. Denied to me and telepsych as well. Says thinks she was just upset over medication issues. Feels that pt safe for discharge at this time. Referrals provided.  Raeford Razor, MD 08/18/11 [phone]

## 2011-08-17 NOTE — Discharge Instructions (Signed)
Depression, Adolescent and Adult Depression is a true and treatable medical condition. In general there are two kinds of depression:  Depression we all experience in some form. For example depression from the death of a loved one, financial distress or natural disasters will trigger or increase depression.   Clinical depression, on the other hand, appears without an apparent cause or reason. This depression is a disease. Depression may be caused by chemical imbalance in the body and brain or may come as a response to a physical illness. Alcohol and other drugs can cause depression.  DIAGNOSIS  The diagnosis of depression is usually based upon symptoms and medical history. TREATMENT  Treatments for depression fall into three categories. These are:  Drug therapy. There are many medicines that treat depression. Responses may vary and sometimes trial and error is necessary to determine the best medicines and dosage for a particular patient.   Psychotherapy, also called talking treatments, helps people resolve their problems by looking at them from a different point of view and by giving people insight into their own personal makeup. Traditional psychotherapy looks at a childhood source of a problem. Other psychotherapy will look at current conflicts and move toward solving those. If the cause of depression is drug use, counseling is available to help abstain. In time the depression will usually improve. If there were underlying causes for the chemical use, they can be addressed.   ECT (electroconvulsive therapy) or shock treatment is not as commonly used today. It is a very effective treatment for severe suicidal depression. During ECT electrical impulses are applied to the head. These impulses cause a generalized seizure. It can be effective but causes a loss of memory for recent events. Sometimes this loss of memory may include the last several months.  Treat all depression or suicide threats as  serious. Obtain professional help. Do not wait to see if serious depression will get better over time without help. Seek help for yourself or those around you. In the [place] the number to the National Suicide Help Lines With 24 Hour Help Are: 1-800-SUICIDE 1-800-784-2433 Document Released: 04/10/2000 Document Revised: 04/02/2011 Document Reviewed: 11/30/2007 ExitCare Patient Information 2012 [employer] LLC.Depression  Depression is a strong emotion of feeling unhappy that can last for weeks, months, or even longer. Depression causes problems with the ability to function in life. It upsets your:   Relationships.   Sleep.   Eating habits.   Work habits.  HOME CARE  Take all medicine as told by your doctor.   Talk with a therapist, counselor, or friend.   Eat a healthy diet.   Exercise regularly.   Do not drink alcohol or use drugs.  GET HELP RIGHT AWAY IF: You start to have thoughts about hurting yourself or others. MAKE SURE YOU:  Understand these instructions.   Will watch your condition.   Will get help right away if you are not doing well or get worse.  Document Released: 05/16/2010 Document Revised: 04/02/2011 Document Reviewed: 05/16/2010 ExitCare Patient Information 2012 ExitCare, LLC.  RESOURCE GUIDE  Dental Problems  Patients with Medicaid: [facility]                     [facility] [address]                                             [address] Phone:  [phone]                                                  Phone:  [phone]  If unable to pay or uninsured, contact:  [facility] or [facility] to become qualified for the adult dental clinic.  Chronic Pain Problems Contact [facility]  [phone] Patients need to be referred by their primary care doctor.  Insufficient Money for Medicine Contact [employer]:  call "211" or [facility] 271-5999.  No Primary Care  Doctor Call [facility]  832-8000 Other agencies that provide inexpensive medical care    [facility]  832-8035    [facility]  [phone]    [facility]  [phone]    Women's Clinic  832-4777    [facility]  [phone]    [facility]  [phone]  Psychological Services [facility]  [phone] [facility]  [phone] [facility]   800 853-5163 (emergency services 641-4993)  Substance Abuse Resources [facility]  336-882-2125 [facility] 336-784-9470 [facility] 336-285-9073 [facility] 336-845-3988 Residential & Outpatient Substance Abuse Program  800-659-3381  Abuse/Neglect [place] Child Abuse Hotline (336) 641-3795 [place] Child Abuse Hotline 800-378-5315 (After Hours)  Emergency Shelter [facility] (336) 271-5985  [facility] (336) 275-9566 [facility] (704) 372-4663  MRSA Hotline #:   832-7006    [place] Resources  [facility] [place]     [employer]                          [facility] [address] [place]                       [address]      [address]  [place]                                                [place]                            [place] Phone:  [phone]                                   Phone:  [phone]                 Phone:  [phone]  [facility] Phone:  [phone]  [place] Child Abuse Hotline (336) 342-1394 (336) 342-3537 (After Hours)   

## 2011-08-17 NOTE — ED Notes (Signed)
To ED with c/o suicidal. States she has felt this way for past 2 days. Tearful. No family support.

## 2011-08-17 NOTE — Progress Notes (Signed)
Pt was here today for appointment with the financial counselor.  She asked for a nurse and told the receptionist she was feeling depressed. Nickcole was invited into the exam room for a discussion which included myself and Starleen Arms, CMA as a witness. She stated her life was really "messed up right now" and she had "a lot of things going on inside her head".  She stated she was very depressed and felt like killing herself.  She stated she  did not have a plan but she felt distraught.  She remembers  feeling like this before and was admitted to [facility].  I asked Leanah if she was willing to  seek treatment  at our mental health facility and she is in agreement.  She states she would really like to be admitted"so she would not do anything crazy".  I am very familiar with Kloi since she has been a patient for many years here in the clinic.  She does not appear to be her usual self and usually does not admit to being depressed during routine office visits even when it is obvious. Today she has flat affect and is holding her head down when speaking, crying and seeking needed mental health attention.   I have phoned the mobile crisis center for assistance . The person who took the information stated  someone should call within 15 minutes.  After not receiving a call within 30 minutes we felt it was best to escort Earnestine to the [facility] Emergency Room for assistance.  Someone was present with Morrie Sheldon during her time in the office.  All sharp objects or potentially harmful items  were removed from the room.   Jennet Maduro, RN escorted the patient to the emergency room.   Laurell Josephs, RN, BSN

## 2011-08-18 NOTE — ED Provider Notes (Signed)
Medical screening examination/treatment/procedure(s) were performed by non-physician practitioner and as supervising physician I was immediately available for consultation/collaboration.   Gerhard Munch, MD 08/18/11 [phone]

## 2011-08-19 ENCOUNTER — Other Ambulatory Visit: Payer: Self-pay | Admitting: *Deleted

## 2011-08-19 DIAGNOSIS — B20 Human immunodeficiency virus [HIV] disease: Secondary | ICD-10-CM

## 2011-08-19 MED ORDER — SULFAMETHOXAZOLE-TMP DS 800-160 MG PO TABS: 1.0000 | ORAL_TABLET | Freq: Every day | ORAL | Status: DC

## 2012-02-06 ENCOUNTER — Other Ambulatory Visit: Payer: Self-pay | Admitting: Infectious Diseases

## 2012-02-22 ENCOUNTER — Other Ambulatory Visit (INDEPENDENT_AMBULATORY_CARE_PROVIDER_SITE_OTHER): Payer: Self-pay

## 2012-02-22 DIAGNOSIS — Z79899 Other long term (current) drug therapy: Secondary | ICD-10-CM

## 2012-02-22 DIAGNOSIS — B20 Human immunodeficiency virus [HIV] disease: Secondary | ICD-10-CM

## 2012-02-22 DIAGNOSIS — Z113 Encounter for screening for infections with a predominantly sexual mode of transmission: Secondary | ICD-10-CM

## 2012-02-23 LAB — LIPID PANEL
LDL Cholesterol: 95 mg/dL (ref 0–99)
VLDL: 15 mg/dL (ref 0–40)

## 2012-02-23 LAB — CBC WITH DIFFERENTIAL/PLATELET
Basophils Absolute: 0 10*3/uL (ref 0.0–0.1)
Basophils Relative: 1 % (ref 0–1)
HCT: 41.3 % (ref 36.0–46.0)
Hemoglobin: 14.3 g/dL (ref 12.0–15.0)
Lymphocytes Relative: 57 % — ABNORMAL HIGH (ref 12–46)
MCHC: 34.6 g/dL (ref 30.0–36.0)
Monocytes Absolute: 0.4 10*3/uL (ref 0.1–1.0)
Monocytes Relative: 8 % (ref 3–12)
Neutro Abs: 1.5 10*3/uL — ABNORMAL LOW (ref 1.7–7.7)
Neutrophils Relative %: 32 % — ABNORMAL LOW (ref 43–77)
RBC: 4.2 MIL/uL (ref 3.87–5.11)
WBC: 4.7 10*3/uL (ref 4.0–10.5)

## 2012-02-23 LAB — COMPLETE METABOLIC PANEL WITH GFR
AST: 19 U/L (ref 0–37)
Albumin: 4.5 g/dL (ref 3.5–5.2)
Alkaline Phosphatase: 52 U/L (ref 39–117)
BUN: 8 mg/dL (ref 6–23)
Calcium: 9.4 mg/dL (ref 8.4–10.5)
Chloride: 104 mEq/L (ref 96–112)
Glucose, Bld: 72 mg/dL (ref 70–99)
Potassium: 4.2 mEq/L (ref 3.5–5.3)
Sodium: 140 mEq/L (ref 135–145)
Total Protein: 7.5 g/dL (ref 6.0–8.3)

## 2012-02-23 LAB — HIV-1 RNA QUANT-NO REFLEX-BLD: HIV 1 RNA Quant: <20 copies/mL (ref <20)

## 2012-02-25 ENCOUNTER — Other Ambulatory Visit: Payer: Self-pay | Admitting: *Deleted

## 2012-02-25 NOTE — Telephone Encounter (Signed)
Refills for ART done on 02/06/12.  Bactrim not refilled.  Will be discussed at OV w/ Dr. Luciana Axe on 03/07/12.

## 2012-03-07 ENCOUNTER — Ambulatory Visit (INDEPENDENT_AMBULATORY_CARE_PROVIDER_SITE_OTHER): Payer: Self-pay | Admitting: Internal Medicine

## 2012-03-07 ENCOUNTER — Encounter: Payer: Self-pay | Admitting: Internal Medicine

## 2012-03-07 VITALS — BP 127/85 | HR 91 | Temp 97.2°F | Wt 102.0 lb

## 2012-03-07 DIAGNOSIS — B20 Human immunodeficiency virus [HIV] disease: Secondary | ICD-10-CM

## 2012-03-07 DIAGNOSIS — R636 Underweight: Secondary | ICD-10-CM

## 2012-03-07 DIAGNOSIS — F329 Major depressive disorder, single episode, unspecified: Secondary | ICD-10-CM

## 2012-03-07 DIAGNOSIS — F3289 Other specified depressive episodes: Secondary | ICD-10-CM

## 2012-03-07 NOTE — Assessment & Plan Note (Signed)
She tells me she does have a good appetite and eats a lot though she admits that with smoking she probably doesn't need quite as much and she would otherwise. She is a few pounds heavier than previously but I did encourage her to continue to eat well.

## 2012-03-07 NOTE — Progress Notes (Signed)
  Subjective:    Patient ID: [name], female    DOB: 10-Mar-1986, 26 y.o.   MRN: [id number]  HPI She comes in for followup of her HIV. She continues to take Reyataz, Norvir and Truvada. She denies any missed doses. She does have an undetectable viral load and good CD4 count. Since her last visit, she has been going for mental health counseling at [facility] and feels like she is doing well handling it. She tells me she is in a much better place now than previously. She has no complaints. She tells me she does eat well though she has not had any significant weight gain.   Review of Systems  Constitutional: Negative for fever, chills, activity change, appetite change, fatigue and unexpected weight change.  HENT: Negative for sore throat and trouble swallowing.   Respiratory: Negative for cough and shortness of breath.   Cardiovascular: Negative for palpitations and leg swelling.  Gastrointestinal: Negative for nausea, abdominal pain and diarrhea.  Musculoskeletal: Negative for myalgias, joint swelling and arthralgias.  Neurological: Negative for dizziness and headaches.  Hematological: Negative for adenopathy.       Objective:   Physical Exam  Constitutional: She appears well-developed and well-nourished. No distress.  HENT:  Mouth/Throat: No oropharyngeal exudate.  Cardiovascular: Normal rate, regular rhythm and normal heart sounds.  Exam reveals no gallop and no friction rub.   No murmur heard. Pulmonary/Chest: Effort normal and breath sounds normal. No respiratory distress. She has no wheezes. She has no rales.  Abdominal: Soft. Bowel sounds are normal. She exhibits no distension. There is no tenderness. There is no rebound.          Assessment & Plan:

## 2012-03-07 NOTE — Assessment & Plan Note (Addendum)
Despite her mental health issues and life issues, she does very well with her HIV. I encouraged her to continue and she is pleasant and happy to continue.  She was reminded that condom use is indicated  > 40 minutes spent with face to face contact and coordination of care

## 2012-03-07 NOTE — Assessment & Plan Note (Signed)
She seems to be doing much better and his going to continue with [facility]

## 2012-03-18 ENCOUNTER — Telehealth: Payer: Self-pay | Admitting: *Deleted

## 2012-03-18 ENCOUNTER — Ambulatory Visit: Payer: Self-pay

## 2012-03-18 NOTE — Telephone Encounter (Signed)
Patient called to cancel her PAP appt as she just started her period and would feel uncomfortable.

## 2012-05-18 ENCOUNTER — Telehealth: Payer: Self-pay | Admitting: *Deleted

## 2012-05-18 ENCOUNTER — Ambulatory Visit: Payer: Self-pay | Admitting: Internal Medicine

## 2012-05-18 NOTE — Telephone Encounter (Signed)
Patient walked into clinic c/o painful genital warts, she is currently uninsured. Given appt with Dr. Orvan Falconer for today. Wendall Mola CMA

## 2012-05-24 ENCOUNTER — Encounter: Payer: Self-pay | Admitting: Internal Medicine

## 2012-05-24 ENCOUNTER — Ambulatory Visit (INDEPENDENT_AMBULATORY_CARE_PROVIDER_SITE_OTHER): Payer: Self-pay | Admitting: Internal Medicine

## 2012-05-24 VITALS — BP 133/90 | HR 80 | Temp 98.0°F | Ht 64.0 in | Wt 100.0 lb

## 2012-05-24 DIAGNOSIS — B20 Human immunodeficiency virus [HIV] disease: Secondary | ICD-10-CM

## 2012-05-24 DIAGNOSIS — A630 Anogenital (venereal) warts: Secondary | ICD-10-CM

## 2012-05-24 MED ORDER — CLOTRIMAZOLE-BETAMETHASONE 1-0.05 % EX CREA: TOPICAL_CREAM | Freq: Two times a day (BID) | CUTANEOUS | Status: DC

## 2012-05-24 MED ORDER — MEGESTROL ACETATE 40 MG PO TABS: 200.0000 mg | ORAL_TABLET | Freq: Two times a day (BID) | ORAL | Status: DC

## 2012-05-24 NOTE — Assessment & Plan Note (Signed)
She will be referred back to gynecology to help manage this

## 2012-05-24 NOTE — Progress Notes (Signed)
  Subjective:    Patient ID: [name], female    DOB: 07-13-1985, 27 y.o.   MRN: [id number]  HPI She comes in here for a work in visit. She states that she's had these genital warts that have been bothering her more. She previously was having them managed by gynecology however she stopped going. They however have started to grow significantly and are very bothersome and painful to sit and move around. She also tells me she recently had what she feels was shingles on her right buttock. This has now dried and resolved. She also tells me she did miss 4 days of her medications for HIV which are Reyataz, Norvir and Truvada due to being out of town but she otherwise takes her medicine well.   Review of Systems  Constitutional: Negative for fever, fatigue and unexpected weight change.  HENT: Negative for sore throat and trouble swallowing.   Gastrointestinal: Negative for abdominal pain and diarrhea.  Skin:       Perianal warts       Objective:   Physical Exam  Constitutional:       Thin appearing  Genitourinary:       Significant. Anal warts          Assessment & Plan:

## 2012-05-24 NOTE — Assessment & Plan Note (Signed)
I am going to check her labs today since she missed 4 days. She'll come back in 6 weeks just to check up on progress of her warts and assure continued compliance. She will be called sooner if there are any issues with her labs

## 2012-05-25 ENCOUNTER — Telehealth: Payer: Self-pay | Admitting: *Deleted

## 2012-05-25 LAB — HIV-1 RNA QUANT-NO REFLEX-BLD
HIV 1 RNA Quant: <20 copies/mL (ref <20)
HIV-1 RNA Quant, Log: <1.3 {Log} (ref <1.30)

## 2012-05-25 LAB — T-HELPER CELL (CD4) - (RCID CLINIC ONLY)
CD4 % Helper T Cell: 23 % — ABNORMAL LOW (ref 33–55)
CD4 T Cell Abs: 670 uL (ref 400–2700)

## 2012-05-25 NOTE — Telephone Encounter (Signed)
Called patient and left voice mail with appt info for the [facility] on 06/10/12 at 10:15 AM. Wendall Mola

## 2012-05-31 ENCOUNTER — Ambulatory Visit: Payer: Self-pay

## 2012-06-10 ENCOUNTER — Encounter: Payer: Self-pay | Admitting: Medical

## 2012-06-15 ENCOUNTER — Telehealth: Payer: Self-pay | Admitting: *Deleted

## 2012-06-15 ENCOUNTER — Encounter: Payer: No Typology Code available for payment source | Admitting: Obstetrics & Gynecology

## 2012-06-15 NOTE — Telephone Encounter (Signed)
Called and left patient a voice mail regarding her appt at [facility] for 06/30/12 at 2:15 pm. Asked that she return my call letting me know she got this appt information. Wendall Mola

## 2012-06-27 ENCOUNTER — Other Ambulatory Visit: Payer: No Typology Code available for payment source

## 2012-06-27 ENCOUNTER — Other Ambulatory Visit (INDEPENDENT_AMBULATORY_CARE_PROVIDER_SITE_OTHER): Payer: No Typology Code available for payment source | Admitting: Licensed Clinical Social Worker

## 2012-06-27 DIAGNOSIS — N926 Irregular menstruation, unspecified: Secondary | ICD-10-CM

## 2012-06-27 DIAGNOSIS — N949 Unspecified condition associated with female genital organs and menstrual cycle: Secondary | ICD-10-CM

## 2012-06-30 ENCOUNTER — Encounter: Payer: Self-pay | Admitting: Obstetrics & Gynecology

## 2012-06-30 ENCOUNTER — Ambulatory Visit (INDEPENDENT_AMBULATORY_CARE_PROVIDER_SITE_OTHER): Payer: No Typology Code available for payment source | Admitting: Obstetrics & Gynecology

## 2012-06-30 ENCOUNTER — Other Ambulatory Visit (HOSPITAL_COMMUNITY)
Admission: RE | Admit: 2012-06-30 | Discharge: 2012-06-30 | Disposition: A | Discharge status: Home / Self Care | Payer: No Typology Code available for payment source | Source: Ambulatory Visit | Attending: Obstetrics & Gynecology | Admitting: Obstetrics & Gynecology

## 2012-06-30 VITALS — BP 143/76 | HR 65 | Ht 64.0 in | Wt 103.4 lb

## 2012-06-30 DIAGNOSIS — A630 Anogenital (venereal) warts: Secondary | ICD-10-CM

## 2012-06-30 DIAGNOSIS — D013 Carcinoma in situ of anus and anal canal: Secondary | ICD-10-CM | POA: Insufficient documentation

## 2012-06-30 MED ORDER — LIDOCAINE 5 % EX OINT: TOPICAL_OINTMENT | CUTANEOUS | Status: DC | PRN

## 2012-06-30 NOTE — Progress Notes (Signed)
Patient ID: [name], female   DOB: 1985/05/08, 27 y.o.   MRN: [id number]  Chief Complaint  Patient presents with  . Genital Warts    TCA treatment    HPI [name] is a 27 y.o. female.  [id number] Patient's last menstrual period was 05/23/2012. Referred for genital, perianal warts, present longstanding, treated with TCA in past but did not return   HPI  Past Medical History  Diagnosis Date  . HIV (human immunodeficiency virus infection)   . Depression   . Anemia   . Bronchitis   . HSV (herpes simplex virus) infection     Past Surgical History  Procedure Laterality Date  . Dilation and curettage of uterus      History reviewed. No pertinent family history.  Social History History  Substance Use Topics  . Smoking status: Current Every Day Smoker -- 0.50 packs/day for 7 years    Types: Cigarettes  . Smokeless tobacco: Never Used  . Alcohol Use: No    No Known Allergies  Current Outpatient Prescriptions  Medication Sig Dispense Refill  . clotrimazole-betamethasone (LOTRISONE) cream Apply topically 2 (two) times daily.  45 g  2  . NORVIR 100 MG TABS TAKE 1 TABLET BY MOUTH DAILY . TAKE WITH REYATAZ  30 tablet  4  . REYATAZ 300 MG capsule TAKE 1 CAPSULE BY MOUTH DAILY WITH BREAKFAST. TAKE WITH NORVIR  30 capsule  4  . TRUVADA 200-300 MG per tablet TAKE 1 TABLET BY MOUTH DAILY  30 tablet  4  . lidocaine (XYLOCAINE) 5 % ointment Apply topically as needed.  35.44 g  0  . megestrol (MEGACE) 40 MG tablet Take 5 tablets (200 mg total) by mouth 2 (two) times daily.  300 tablet  5   No current facility-administered medications for this visit.    Review of Systems Review of Systems  Genitourinary:       Perianal warts and itch    Blood pressure 143/76, pulse 65, height 5\' 4"  (1.626 m), weight 103 lb 6.4 oz (46.902 kg), last menstrual period 05/23/2012, unknown if currently breastfeeding.  Physical Exam Physical Exam  Constitutional: No distress.  underweight   Genitourinary:  extensive perianal warts. 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

## 2012-06-30 NOTE — Patient Instructions (Signed)
Genital Warts Genital warts are a sexually transmitted infection. They may appear as small bumps on the tissues of the genital area. CAUSES  Genital warts are caused by a virus called human papillomavirus (HPV). HPV is the most common sexually transmitted disease (STD) and infection of the sex organs. This infection is spread by having unprotected sex with an infected person. It can be spread by vaginal, anal, and oral sex. Many people do not know they are infected. They may be infected for years without problems. However, even if they do not have problems, they can unknowingly pass the infection to their sexual partners. SYMPTOMS   Itching and irritation in the genital area.  Warts that bleed.  Painful sexual intercourse. DIAGNOSIS  Warts are usually recognized with the naked eye on the vagina, vulva, perineum, anus, and rectum. Certain tests can also diagnose genital warts, such as:  A Pap test.  A tissue sample (biopsy) exam.  Colposcopy. A magnifying tool is used to examine the vagina and cervix. The HPV cells will change color when certain solutions are used. TREATMENT  Warts can be removed by:  Applying certain chemicals, such as cantharidin or podophyllin.  Liquid nitrogen freezing (cryotherapy).  Immunotherapy with candida or trichophyton injections.  Laser treatment.  Burning with an electrified probe (electrocautery).  Interferon injections.  Surgery. PREVENTION  HPV vaccination can help prevent HPV infections that cause genital warts and that cause cancer of the cervix. It is recommended that the vaccination be given to people between the ages 9 to 26 years old. The vaccine might not work as well or might not work at all if you already have HPV. It should not be given to pregnant women. HOME CARE INSTRUCTIONS   It is important to follow your caregiver's instructions. The warts will not go away without treatment. Repeat treatments are often needed to get rid of warts.  Even after it appears that the warts are gone, the normal tissue underneath often remains infected.  Do not try to treat genital warts with medicine used to treat hand warts. This type of medicine is strong and can burn the skin in the genital area, causing more damage.  Tell your past and current sexual partner(s) that you have genital warts. They may be infected also and need treatment.  Avoid sexual contact while being treated.  Do not touch or scratch the warts. The infection may spread to other parts of your body.  Women with genital warts should have a cervical cancer check (Pap test) at least once a year. This type of cancer is slow-growing and can be cured if found early. Chances of developing cervical cancer are increased with HPV.  Inform your obstetrician about your warts in the event of pregnancy. This virus can be passed to the baby's respiratory tract. Discuss this with your caregiver.  Use a condom during sexual intercourse. Following treatment, the use of condoms will help prevent reinfection.  Ask your caregiver about using over-the-counter anti-itch creams. SEEK MEDICAL CARE IF:   Your treated skin becomes red, swollen, or painful.  You have a fever.  You feel generally ill.  You feel little lumps in and around your genital area.  You are bleeding or have painful sexual intercourse. MAKE SURE YOU:   Understand these instructions.  Will watch your condition.  Will get help right away if you are not doing well or get worse. Document Released: 04/10/2000 Document Revised: 07/06/2011 Document Reviewed: 10/20/2010 Exit[employer] Patient Information 2013 [employer].  

## 2012-07-07 ENCOUNTER — Encounter: Payer: Self-pay | Admitting: *Deleted

## 2012-07-07 ENCOUNTER — Ambulatory Visit: Payer: Self-pay | Admitting: Internal Medicine

## 2012-07-20 ENCOUNTER — Ambulatory Visit: Payer: No Typology Code available for payment source | Admitting: Obstetrics & Gynecology

## 2012-07-21 ENCOUNTER — Ambulatory Visit: Payer: No Typology Code available for payment source | Admitting: Obstetrics & Gynecology

## 2012-07-27 ENCOUNTER — Ambulatory Visit (INDEPENDENT_AMBULATORY_CARE_PROVIDER_SITE_OTHER): Payer: No Typology Code available for payment source | Admitting: Internal Medicine

## 2012-07-27 ENCOUNTER — Encounter: Payer: Self-pay | Admitting: Internal Medicine

## 2012-07-27 VITALS — BP 129/80 | HR 94 | Temp 97.8°F | Wt 108.0 lb

## 2012-07-27 DIAGNOSIS — B20 Human immunodeficiency virus [HIV] disease: Secondary | ICD-10-CM

## 2012-07-27 DIAGNOSIS — A630 Anogenital (venereal) warts: Secondary | ICD-10-CM

## 2012-07-27 LAB — CBC WITH DIFFERENTIAL/PLATELET
Basophils Relative: 0 % (ref 0–1)
Eosinophils Absolute: 0.1 10*3/uL (ref 0.0–0.7)
Eosinophils Relative: 1 % (ref 0–5)
HCT: 38.9 % (ref 36.0–46.0)
Hemoglobin: 13.8 g/dL (ref 12.0–15.0)
MCH: 33.3 pg (ref 26.0–34.0)
MCHC: 35.5 g/dL (ref 30.0–36.0)
MCV: 93.7 fL (ref 78.0–100.0)
Monocytes Absolute: 0.4 10*3/uL (ref 0.1–1.0)
Monocytes Relative: 6 % (ref 3–12)
RDW: 14.3 % (ref 11.5–15.5)

## 2012-07-27 NOTE — Assessment & Plan Note (Signed)
She has been seen by gynecology and does have followup and is aware of for followup. I did note and the pathology that is a significant and she likely will require more surgical intervention.  I will check her pregnancy, she was unable urine so I will add onto her blood

## 2012-07-27 NOTE — Progress Notes (Signed)
  Subjective:    Patient ID: [name], female    DOB: February 24, 1986, 27 y.o.   MRN: [id number]  Headache  Pertinent negatives include no abdominal pain, dizziness, fever, nausea or sore throat.   She comes in for followup of her HIV. She was last seen in January and reported this doses for about 4 days to 2 not getting a refill. She also had genital warts which were quite large and has been seen by gynecology who is concerned with cervical cancer. She does have followup in about 2 weeks with them. She tells me she has not had her menstrual period in over 2 months though about one month ago and did have a urine that was negative for pregnancy. She tells me now though she is taking her medications daily with no missed doseswhich includes atazanavir, Norvir and Truvada. She has not got labs prior to this visit yet.   Review of Systems  Constitutional: Negative for fever, chills, fatigue and unexpected weight change.  HENT: Negative for sore throat and trouble swallowing.   Cardiovascular: Negative for leg swelling.  Gastrointestinal: Negative for nausea, abdominal pain and diarrhea.  Musculoskeletal: Negative for myalgias, joint swelling and arthralgias.  Skin: Negative for rash.  Neurological: Negative for dizziness, light-headedness and headaches.       Objective:   Physical Exam  Constitutional: She appears well-developed and well-nourished. No distress.  Cardiovascular: Normal rate, regular rhythm and normal heart sounds.  Exam reveals no gallop and no friction rub.   No murmur heard. Pulmonary/Chest: Effort normal and breath sounds normal. No respiratory distress. She has no wheezes. She has no rales.  Abdominal: Soft. Bowel sounds are normal. She exhibits no distension. There is no tenderness. There is no rebound.  Lymphadenopathy:    She has no cervical adenopathy.          Assessment & Plan:

## 2012-07-27 NOTE — Assessment & Plan Note (Signed)
I will check her labs today. She has been undetectable the past and hopefully has remained. We'll followup with her in 3 months.

## 2012-07-28 LAB — COMPLETE METABOLIC PANEL WITH GFR
ALT: 14 U/L (ref 0–35)
AST: 19 U/L (ref 0–37)
Albumin: 4.4 g/dL (ref 3.5–5.2)
BUN: 9 mg/dL (ref 6–23)
CO2: 27 mEq/L (ref 19–32)
Calcium: 9.2 mg/dL (ref 8.4–10.5)
Chloride: 106 mEq/L (ref 96–112)
Creat: 0.82 mg/dL (ref 0.50–1.10)
GFR, Est African American: >89 mL/min
Potassium: 3.6 mEq/L (ref 3.5–5.3)

## 2012-07-28 LAB — T-HELPER CELL (CD4) - (RCID CLINIC ONLY): CD4 T Cell Abs: 790 uL (ref 400–2700)

## 2012-07-29 LAB — HIV-1 RNA QUANT-NO REFLEX-BLD
HIV 1 RNA Quant: 47 copies/mL — ABNORMAL HIGH (ref <20)
HIV-1 RNA Quant, Log: 1.67 {Log} — ABNORMAL HIGH (ref <1.30)

## 2012-08-11 ENCOUNTER — Encounter: Payer: Self-pay | Admitting: Medical

## 2012-08-11 ENCOUNTER — Ambulatory Visit (INDEPENDENT_AMBULATORY_CARE_PROVIDER_SITE_OTHER): Payer: No Typology Code available for payment source | Admitting: Medical

## 2012-08-11 VITALS — BP 125/83 | HR 84 | Ht 64.0 in | Wt 107.6 lb

## 2012-08-11 DIAGNOSIS — A630 Anogenital (venereal) warts: Secondary | ICD-10-CM

## 2012-08-11 NOTE — Progress Notes (Signed)
Patient ID: [name], female   DOB: 06-04-1985, 27 y.o.   MRN: [id number]  Patient left before being seen. Her appointment has been rescheduled.

## 2012-08-12 ENCOUNTER — Ambulatory Visit: Payer: No Typology Code available for payment source | Admitting: Medical

## 2012-08-15 ENCOUNTER — Ambulatory Visit: Payer: No Typology Code available for payment source | Admitting: Obstetrics & Gynecology

## 2012-08-18 ENCOUNTER — Telehealth: Payer: Self-pay | Admitting: *Deleted

## 2012-08-18 ENCOUNTER — Ambulatory Visit: Payer: No Typology Code available for payment source | Admitting: Medical

## 2012-08-18 DIAGNOSIS — K6282 Dysplasia of anus: Secondary | ICD-10-CM

## 2012-08-18 NOTE — Telephone Encounter (Signed)
Faxed referral to [facility]. Called [name] and left a message we are calling with some important information. Need to tell her referral sent and they will call her with an appointment- may need explanation of what referral is for.

## 2012-08-18 NOTE — Telephone Encounter (Signed)
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

## 2012-08-19 NOTE — Telephone Encounter (Signed)
Called patient. No answer. Left message to call clinic back for important information.

## 2012-08-23 ENCOUNTER — Telehealth: Payer: Self-pay | Admitting: *Deleted

## 2012-08-23 NOTE — Telephone Encounter (Signed)
Received call from [place] @ [facility] regarding pt. She is following up on message that had been left for Dr. Debroah Loop and wanted to be sure he got the message. She stated that per Dr. Truett Perna, unless the pt's condition is invasive, this is a surgical issue. Dr. Debroah Loop may call Dr. Truett Perna if he has questions. I stated that I will send a message to Dr. Debroah Loop with this information. I also noted from pt's chart that pt did not keep clinic appt for follow up care, so I am unsure if she is aware of this diagnosis. Will await response from Dr. Debroah Loop for plan of care.

## 2012-08-23 NOTE — Telephone Encounter (Signed)
This RN spoke with Dr. Olivia Mackie nurse to confirm receipt of message to Dr. Debroah Loop from Dr. Truett Perna last Friday stating the patient needs surgical referral and not oncology referral at this time.  Message given for Dr. Debroah Loop to please call Dr. Truett Perna to discuss further if needed or if this is invasive cancer.

## 2012-08-25 ENCOUNTER — Encounter: Payer: Self-pay | Admitting: Obstetrics & Gynecology

## 2012-08-25 DIAGNOSIS — D013 Carcinoma in situ of anus and anal canal: Secondary | ICD-10-CM | POA: Insufficient documentation

## 2012-08-25 NOTE — Telephone Encounter (Signed)
Discussed with Dr. Debroah Loop today and her called and spoke with Dr. Myrle Sheng and they decided the plan is to be referral to [facility] for surgery. Dr. Debroah Loop called patient and discussed results and plan of care with her and answered her questions. I spoke with patient and informed her I would schedule the referral to [facility] and call her with an appointment . She has no insurance but has the [facility] orange card.

## 2012-08-25 NOTE — Telephone Encounter (Signed)
[facility] surgery to schedule an appointment- [phone].( address [address])made appointment for 09/06/12 arrive 2pm for 230 appointment.with Dr. Alexandria Lodge  They do accept orange card and patient should bring orange card , picture id, and copay per orange card. Called Khristine and left message I was calling back with the appointment- please call clinic Friday 8am-12 noon and leave message if we may call back and leave information on your voice mail.

## 2012-08-25 NOTE — Telephone Encounter (Signed)
[name] called back and I gave her the appointment information along with phone number and address and she voiced understanding.

## 2012-09-05 ENCOUNTER — Other Ambulatory Visit: Payer: Self-pay | Admitting: *Deleted

## 2012-09-05 DIAGNOSIS — B20 Human immunodeficiency virus [HIV] disease: Secondary | ICD-10-CM

## 2012-09-05 MED ORDER — RITONAVIR 100 MG PO TABS: ORAL_TABLET | ORAL | Status: DC

## 2012-09-05 MED ORDER — EMTRICITABINE-TENOFOVIR DF 200-300 MG PO TABS: ORAL_TABLET | ORAL | Status: DC

## 2012-09-05 MED ORDER — ATAZANAVIR SULFATE 300 MG PO CAPS: ORAL_CAPSULE | ORAL | Status: DC

## 2012-09-06 ENCOUNTER — Encounter (INDEPENDENT_AMBULATORY_CARE_PROVIDER_SITE_OTHER): Payer: Self-pay | Admitting: Surgery

## 2012-09-06 ENCOUNTER — Ambulatory Visit (INDEPENDENT_AMBULATORY_CARE_PROVIDER_SITE_OTHER): Payer: PRIVATE HEALTH INSURANCE | Admitting: Surgery

## 2012-09-06 VITALS — BP 112/70 | HR 68 | Temp 97.9°F | Resp 28 | Ht 64.0 in | Wt 107.0 lb

## 2012-09-06 DIAGNOSIS — D013 Carcinoma in situ of anus and anal canal: Secondary | ICD-10-CM

## 2012-09-06 DIAGNOSIS — A630 Anogenital (venereal) warts: Secondary | ICD-10-CM

## 2012-09-06 DIAGNOSIS — B20 Human immunodeficiency virus [HIV] disease: Secondary | ICD-10-CM

## 2012-09-06 NOTE — Patient Instructions (Addendum)
Anal Intraepithelial Neoplasia (AIN) ANAL INTRAEPITHELIAL NEOPLASIA (AIN) Anal Intra-epithelial Neoplasia (AIN) is a pre-cancerous condition of the skin surrounding the anus. In the early stages of AIN there are abnormal skin cells (epithelial cells) in the outer third of the skin (epithelium). This is called AIN1 (also called a low-grade anal squamous intra-epithelial lesion (LSIL). This can progress to AIN2, where the outer 2/3 of the skin contains abnormal cells, which in turn can result in AIN3 where there are abnormal cells involving the full thickness of skin (AIN2 and AIN3 are also called high-grade anal squamous intra-epithelial lesions (HSIL). The next step is for these abnormal cells to cross a barrier at the base of the skin called the basement membrane. Once this occurs, it is invasive cancer (carcinoma).  RISK FACTORS The risk factors for AIN are the same as for anal cancer, and include the immunosuppressed (eg HIV and transplant patients), and those with previous anal warts. PREVENTION The vaccine currently offered to women to reduce the risk of cervical cancer targets the Human Papilloma Virus (HPV) serotypes 16, 18, 6 and 11. Although not approved for this purpose, this vaccine may have a role for reducing the risk of AIN and anal cancer in high risk populations who do not yet have HPV infection. TREATMENT OF AIN Treatment is aimed at eradicating AIN and preventing anal cancer with minimal disturbance to anal function. There is currently no uniform treatment largely due to the uncertain natural history of AIN, the variable extent of disease, and the fact there is not a universally effective treatment modality.  TOPICAL AGENTS Imiquimod cream 5% (Aldara ) can be applied to the area 3 times a day, and can be used for up to 16 weeks. It is an immunomodulatory agent and that attacks the virus responsible for warts, but also has anti-tumour effects [1]. There is some evidence that it not only  slows the progression of AIN, but causes regression, with one study showing that 3/4 of men with AIN had their AIN completely cleared at the end of treatment[2]. It also has the added benefit in those with warts of causing regression of these, and in some cases eradication of the HPV virus. The main disadvantage is relapse in a quarter of cases after cessation of therapy[1]. Side effects of imiquimod include erythema, "flu?like" illness and erosions, which are usually mild. It should not be applied prior to sexual intercourse. A systematic review showed a noncompliance rate of less than 5% because of side effects, mainly related to incompatibility with sexual life [3-4]. Topical 5-Fluorouracil 5% cream (Efudix) can be applied to the area 1-2 times a day, and the largest study showing a recurrence of high grade AIN in only 8% of patients when used for 9-12 weeks[5]. PHOTODYNAMIC THERAPY Photodynamic therapy (PDT) involves the application of a cream (topical sensitizer) such as 5-Flourouracil (Efudix) and subsequent exposure of the anal region to light and oxygen to generate oxygen intermediates that damage areas with AIN [6]. Although there is little evidence, there is a suggestion that early AIN responds to this treatment [7-8]. INFRARED PHOTOCOAGULATION Infrared photocoagulation is the same as photodynamic therapy except that it only uses light with a wavelength longer than visible light. It's use for AIN was first described by Goldstein and colleagues [9], who showed that up to 2/3 of AIN can be cured and is effective in preventing progression to cancer.  SURGERY The treatment of widespread AIN3 is controversial. Most advocate surgery, however even with surgery there is a   one third risk of recurrence [10-11]. If surgery is performed, it consists of either local excision or extensive excision with split skin grafting. The high recurrence rate with these procedures is thought to be related to ongoing HPV,  multi?focal lesions that are not all treated, and a generalised field change. [place] of the high recurrence rate and extensive surgery required for widespread AIN3, many advocate just close surveillance with regular (3-6 monthly) biopsies, so that early invasive anal cancer can be picked up early and treated accordingly. RADIOTHERAPY Radiotherapy has no role for AIN 3. Its only role is for confirmed anal cancer. FOLLOW-UP Follow?up should involve anoscopic examination, with or without the aid of high?resolution anoscopy. AIN 1 should be reviewed every 6?12 months with discharge from follow up upon resolution of AIN. AIN 3, especially in HIV?positive patients should be followed more closely every 3?6 months with or without treatment. The follow?up of AIN 2 is less clear, as the natural history of this condition is so uncertain. A regime somewhere between that of AIN 1 and 3 is advised, with HIV?positive or immunosuppressed patients being followed more like patients with AIN 3.    COLON PREP INSTRUCTIONS for Anal/Rectal Surgery:   Obtain what you need at a pharmacy of your choice:      A bottle of Milk of Magnesia    2 Fleet enemas (generic form OK to use)   DAY PRIOR TO SURGERY:    Switch to drinking liquids or pureed foods only    1:00pm    o Take 2 oz (4 tablespoons) Milk of Magnesia.     Midnight:  Do not eat or drink anything after midnight the night before your surgery.   MORNING OF PROCEDURE:    Remember to not to drink or eat anything that morning    Upon waking up, take the 2 Fleet enemas.  o Use at least 1 hour before leaving house o Try to retain each enema for 5-10 minutes before expelling it.  This should clean your lower colon sufficiently   If you have questions or problems, please call [facility] [phone] to speak to someone in the clinic department at our office
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
## 2012-09-12 ENCOUNTER — Other Ambulatory Visit: Payer: Self-pay

## 2012-09-15 ENCOUNTER — Encounter (HOSPITAL_BASED_OUTPATIENT_CLINIC_OR_DEPARTMENT_OTHER): Payer: Self-pay | Admitting: *Deleted

## 2012-09-15 NOTE — Progress Notes (Signed)
NPO AFTER MN. ARRIVES AT 0815. NEEDS HG AND URINE PREG. WILL DO HIBICLENS SHOWER HS AND AM OF DOS.

## 2012-09-23 ENCOUNTER — Encounter (HOSPITAL_BASED_OUTPATIENT_CLINIC_OR_DEPARTMENT_OTHER): Payer: Self-pay | Admitting: Anesthesiology

## 2012-09-23 ENCOUNTER — Encounter (HOSPITAL_BASED_OUTPATIENT_CLINIC_OR_DEPARTMENT_OTHER): Payer: Self-pay | Admitting: *Deleted

## 2012-09-23 ENCOUNTER — Ambulatory Visit (HOSPITAL_BASED_OUTPATIENT_CLINIC_OR_DEPARTMENT_OTHER): Payer: No Typology Code available for payment source | Admitting: Anesthesiology

## 2012-09-23 ENCOUNTER — Encounter (HOSPITAL_BASED_OUTPATIENT_CLINIC_OR_DEPARTMENT_OTHER)
Admission: RE | Disposition: A | Discharge status: Home / Self Care | Payer: Self-pay | Source: Ambulatory Visit | Attending: Surgery

## 2012-09-23 ENCOUNTER — Ambulatory Visit (HOSPITAL_BASED_OUTPATIENT_CLINIC_OR_DEPARTMENT_OTHER)
Admission: RE | Admit: 2012-09-23 | Discharge: 2012-09-23 | Disposition: A | Discharge status: Home / Self Care | Payer: No Typology Code available for payment source | Source: Ambulatory Visit | Attending: Surgery | Admitting: Surgery

## 2012-09-23 DIAGNOSIS — K644 Residual hemorrhoidal skin tags: Secondary | ICD-10-CM

## 2012-09-23 DIAGNOSIS — F17200 Nicotine dependence, unspecified, uncomplicated: Secondary | ICD-10-CM | POA: Insufficient documentation

## 2012-09-23 DIAGNOSIS — F1210 Cannabis abuse, uncomplicated: Secondary | ICD-10-CM | POA: Insufficient documentation

## 2012-09-23 DIAGNOSIS — Z91199 Patient's noncompliance with other medical treatment and regimen due to unspecified reason: Secondary | ICD-10-CM | POA: Insufficient documentation

## 2012-09-23 DIAGNOSIS — D013 Carcinoma in situ of anus and anal canal: Secondary | ICD-10-CM | POA: Insufficient documentation

## 2012-09-23 DIAGNOSIS — Z79899 Other long term (current) drug therapy: Secondary | ICD-10-CM | POA: Insufficient documentation

## 2012-09-23 DIAGNOSIS — B20 Human immunodeficiency virus [HIV] disease: Secondary | ICD-10-CM | POA: Insufficient documentation

## 2012-09-23 DIAGNOSIS — Z9119 Patient's noncompliance with other medical treatment and regimen: Secondary | ICD-10-CM | POA: Insufficient documentation

## 2012-09-23 DIAGNOSIS — A630 Anogenital (venereal) warts: Secondary | ICD-10-CM

## 2012-09-23 DIAGNOSIS — Z681 Body mass index (BMI) 19 or less, adult: Secondary | ICD-10-CM | POA: Insufficient documentation

## 2012-09-23 DIAGNOSIS — R636 Underweight: Secondary | ICD-10-CM | POA: Insufficient documentation

## 2012-09-23 HISTORY — DX: Personal history of other diseases of the respiratory system: Z87.09

## 2012-09-23 HISTORY — PX: EXAMINATION UNDER ANESTHESIA: SHX1540

## 2012-09-23 HISTORY — DX: Chronic periodontitis, unspecified: K05.30

## 2012-09-23 HISTORY — DX: Personal history of other diseases of the digestive system: Z87.19

## 2012-09-23 HISTORY — PX: LASER ABLATION CONDOLAMATA: SHX5941

## 2012-09-23 HISTORY — DX: Anogenital (venereal) warts: A63.0

## 2012-09-23 HISTORY — DX: Carcinoma in situ of anus and anal canal: D01.3

## 2012-09-23 HISTORY — DX: Personal history of other infectious and parasitic diseases: Z86.19

## 2012-09-23 SURGERY — EXAM UNDER ANESTHESIA
Anesthesia: General | Site: Rectum | Wound class: Clean Contaminated

## 2012-09-23 MED ORDER — SODIUM CHLORIDE 0.9 % IJ SOLN
3.0000 mL | Freq: Two times a day (BID) | INTRAMUSCULAR | Status: DC
  Filled 2012-09-23: qty 3

## 2012-09-23 MED ORDER — FENTANYL CITRATE 0.05 MG/ML IJ SOLN
25.0000 ug | INTRAMUSCULAR | Status: DC | PRN
  Filled 2012-09-23: qty 1

## 2012-09-23 MED ORDER — MIDAZOLAM HCL 5 MG/5ML IJ SOLN
INTRAMUSCULAR | Status: DC | PRN
  Administered 2012-09-23 (×2): 1 mg via INTRAVENOUS

## 2012-09-23 MED ORDER — ONDANSETRON HCL 4 MG/2ML IJ SOLN
INTRAMUSCULAR | Status: DC | PRN
  Administered 2012-09-23: 4 mg via INTRAVENOUS

## 2012-09-23 MED ORDER — CHLORHEXIDINE GLUCONATE 4 % EX LIQD
1.0000 "application " | Freq: Once | CUTANEOUS | Status: DC
  Filled 2012-09-23: qty 15

## 2012-09-23 MED ORDER — KETOROLAC TROMETHAMINE 30 MG/ML IJ SOLN: INTRAMUSCULAR | Status: DC | PRN

## 2012-09-23 MED ORDER — PROMETHAZINE HCL 25 MG/ML IJ SOLN
6.2500 mg | INTRAMUSCULAR | Status: DC | PRN
  Filled 2012-09-23: qty 1

## 2012-09-23 MED ORDER — SODIUM CHLORIDE 0.9 % IJ SOLN
INTRAMUSCULAR | Status: DC | PRN
  Administered 2012-09-23: 10 mL via INTRAVENOUS

## 2012-09-23 MED ORDER — LACTATED RINGERS IV SOLN
INTRAVENOUS | Status: DC | PRN
  Administered 2012-09-23 (×3): via INTRAVENOUS

## 2012-09-23 MED ORDER — ACETAMINOPHEN 325 MG PO TABS
650.0000 mg | ORAL_TABLET | ORAL | Status: DC | PRN
  Filled 2012-09-23: qty 2

## 2012-09-23 MED ORDER — PROPOFOL 10 MG/ML IV BOLUS
INTRAVENOUS | Status: DC | PRN
  Administered 2012-09-23: 50 mg via INTRAVENOUS
  Administered 2012-09-23: 200 mg via INTRAVENOUS

## 2012-09-23 MED ORDER — BUPIVACAINE LIPOSOME 1.3 % IJ SUSP
INTRAMUSCULAR | Status: DC | PRN
  Administered 2012-09-23: 20 mL

## 2012-09-23 MED ORDER — KETOROLAC TROMETHAMINE 30 MG/ML IJ SOLN
INTRAMUSCULAR | Status: DC | PRN
  Administered 2012-09-23: 30 mg via INTRAVENOUS

## 2012-09-23 MED ORDER — OXYCODONE HCL 5 MG PO TABS: 5.0000 mg | ORAL_TABLET | ORAL | Status: DC | PRN

## 2012-09-23 MED ORDER — ACETAMINOPHEN 650 MG RE SUPP
650.0000 mg | RECTAL | Status: DC | PRN
  Filled 2012-09-23: qty 1

## 2012-09-23 MED ORDER — SODIUM CHLORIDE 0.9 % IV SOLN
250.0000 mL | INTRAVENOUS | Status: DC | PRN
  Filled 2012-09-23: qty 250

## 2012-09-23 MED ORDER — LIDOCAINE HCL (CARDIAC) 20 MG/ML IV SOLN
INTRAVENOUS | Status: DC | PRN
  Administered 2012-09-23: 75 mg via INTRAVENOUS

## 2012-09-23 MED ORDER — NAPROXEN 500 MG PO TABS: 500.0000 mg | ORAL_TABLET | Freq: Two times a day (BID) | ORAL | Status: DC

## 2012-09-23 MED ORDER — OXYCODONE HCL 5 MG PO TABS
5.0000 mg | ORAL_TABLET | ORAL | Status: DC | PRN
  Filled 2012-09-23: qty 2

## 2012-09-23 MED ORDER — SODIUM CHLORIDE 0.9 % IJ SOLN
3.0000 mL | INTRAMUSCULAR | Status: DC | PRN
  Filled 2012-09-23: qty 3

## 2012-09-23 MED ORDER — DEXTROSE 5 % IV SOLN
2.0000 g | INTRAVENOUS | Status: DC
  Filled 2012-09-23: qty 2

## 2012-09-23 MED ORDER — FENTANYL CITRATE 0.05 MG/ML IJ SOLN
INTRAMUSCULAR | Status: DC | PRN
  Administered 2012-09-23 (×8): 25 ug via INTRAVENOUS
  Administered 2012-09-23: 50 ug via INTRAVENOUS
  Administered 2012-09-23 (×6): 25 ug via INTRAVENOUS

## 2012-09-23 MED ORDER — DEXTROSE 5 % IV SOLN
2.0000 g | INTRAVENOUS | Status: DC | PRN
  Administered 2012-09-23: 2 g via INTRAVENOUS

## 2012-09-23 MED ORDER — ONDANSETRON HCL 4 MG/2ML IJ SOLN
4.0000 mg | Freq: Four times a day (QID) | INTRAMUSCULAR | Status: DC | PRN
  Filled 2012-09-23: qty 2

## 2012-09-23 MED ORDER — DEXAMETHASONE SODIUM PHOSPHATE 4 MG/ML IJ SOLN
INTRAMUSCULAR | Status: DC | PRN
  Administered 2012-09-23: 10 mg via INTRAVENOUS

## 2012-09-23 MED ORDER — KETOROLAC TROMETHAMINE 30 MG/ML IJ SOLN
15.0000 mg | Freq: Once | INTRAMUSCULAR | Status: DC | PRN
  Filled 2012-09-23: qty 1

## 2012-09-23 MED ORDER — LACTATED RINGERS IV SOLN
INTRAVENOUS | Status: DC
  Administered 2012-09-23: 09:00:00 via INTRAVENOUS
  Filled 2012-09-23: qty 1000

## 2012-09-23 MED ORDER — DIBUCAINE 1 % RE OINT
TOPICAL_OINTMENT | RECTAL | Status: DC | PRN
  Administered 2012-09-23: 1 via RECTAL

## 2012-09-23 MED ORDER — ACETIC ACID 5 % SOLN
Status: DC | PRN
  Administered 2012-09-23: 1 via TOPICAL

## 2012-09-23 SURGICAL SUPPLY — 56 items
BENZOIN TINCTURE PRP APPL 2/3 (GAUZE/BANDAGES/DRESSINGS) IMPLANT
BLADE SURG 15 STRL LF DISP TIS (BLADE) ×1 IMPLANT
BLADE SURG 15 STRL SS (BLADE) ×1
BRIEF STRETCH FOR OB PAD LRG (UNDERPADS AND DIAPERS) ×2 IMPLANT
CANISTER SUCTION 1200CC (MISCELLANEOUS) ×2 IMPLANT
CLEANER CAUTERY TIP 5X5 PAD (MISCELLANEOUS) ×1 IMPLANT
COVER MAYO STAND STRL (DRAPES) IMPLANT
COVER TABLE BACK 60X90 (DRAPES) ×2 IMPLANT
DECANTER SPIKE VIAL GLASS SM (MISCELLANEOUS) ×2 IMPLANT
DERMABOND ADVANCED (GAUZE/BANDAGES/DRESSINGS) ×1
DERMABOND ADVANCED .7 DNX12 (GAUZE/BANDAGES/DRESSINGS) ×1 IMPLANT
DRAPE LG THREE QUARTER DISP (DRAPES) ×2 IMPLANT
DRAPE PED LAPAROTOMY (DRAPES) IMPLANT
DRAPE UTILITY XL STRL (DRAPES) ×2 IMPLANT
DRSG PAD ABDOMINAL 8X10 ST (GAUZE/BANDAGES/DRESSINGS) ×2 IMPLANT
ELECT NEEDLE TIP 2.8 STRL (NEEDLE) ×2 IMPLANT
ELECT REM PT RETURN 9FT ADLT (ELECTROSURGICAL) ×2
ELECTRODE REM PT RTRN 9FT ADLT (ELECTROSURGICAL) ×1 IMPLANT
GAUZE SPONGE 4X4 12PLY STRL LF (GAUZE/BANDAGES/DRESSINGS) ×2 IMPLANT
GAUZE SPONGE 4X4 16PLY XRAY LF (GAUZE/BANDAGES/DRESSINGS) ×4 IMPLANT
GLOVE BIOGEL PI IND STRL 8 (GLOVE) ×1 IMPLANT
GLOVE BIOGEL PI INDICATOR 8 (GLOVE) ×1
GLOVE ECLIPSE 8.0 STRL XLNG CF (GLOVE) ×2 IMPLANT
GOWN PREVENTION PLUS XLARGE (GOWN DISPOSABLE) ×2 IMPLANT
LEGGING LITHOTOMY PAIR STRL (DRAPES) ×2 IMPLANT
NEEDLE HYPO 22GX1.5 SAFETY (NEEDLE) ×2 IMPLANT
NEEDLE HYPO 25X1 1.5 SAFETY (NEEDLE) ×2 IMPLANT
NS IRRIG 500ML POUR BTL (IV SOLUTION) ×2 IMPLANT
PACK BASIN DAY SURGERY FS (CUSTOM PROCEDURE TRAY) ×2 IMPLANT
PAD CLEANER CAUTERY TIP 5X5 (MISCELLANEOUS) ×1
PENCIL BUTTON HOLSTER BLD 10FT (ELECTRODE) ×2 IMPLANT
SHEET MEDIUM DRAPE 40X70 STRL (DRAPES) IMPLANT
SPONGE GAUZE 4X4 12PLY (GAUZE/BANDAGES/DRESSINGS) ×2 IMPLANT
SPONGE SURGIFOAM ABS GEL 100 (HEMOSTASIS) IMPLANT
SPONGE SURGIFOAM ABS GEL 12-7 (HEMOSTASIS) IMPLANT
STRIP CLOSURE SKIN 1/4X4 (GAUZE/BANDAGES/DRESSINGS) ×2 IMPLANT
SURGILUBE 2OZ TUBE FLIPTOP (MISCELLANEOUS) ×2 IMPLANT
SUT VIC AB 2-0 SH 27 (SUTURE) ×1
SUT VIC AB 2-0 SH 27XBRD (SUTURE) ×1 IMPLANT
SUT VIC AB 2-0 UR6 27 (SUTURE) IMPLANT
SUT VIC AB 3-0 SH 18 (SUTURE) ×2 IMPLANT
SUT VICRYL AB 2 0 TIE (SUTURE) ×1 IMPLANT
SUT VICRYL AB 2 0 TIES (SUTURE) ×1
SYR CONTROL 10ML LL (SYRINGE) ×2 IMPLANT
TAPE STRIPS DRAPE STRL (GAUZE/BANDAGES/DRESSINGS) ×2 IMPLANT
TOWEL OR 17X24 6PK STRL BLUE (TOWEL DISPOSABLE) ×2 IMPLANT
TOWEL OR NON WOVEN STRL DISP B (DISPOSABLE) ×2 IMPLANT
TRAY DSU PREP LF (CUSTOM PROCEDURE TRAY) ×2 IMPLANT
TUBE CONNECTING 12X1/4 (SUCTIONS) ×2 IMPLANT
TUBING STERILE (MISCELLANEOUS) ×2 IMPLANT
UNDERPAD 30X30 INCONTINENT (UNDERPADS AND DIAPERS) ×2 IMPLANT
VACUUM HOSE 7/8X10 W/ WAND (MISCELLANEOUS) ×2 IMPLANT
VACUUM HOSE/TUBING 7/8INX6FT (MISCELLANEOUS) ×2 IMPLANT
WATER STERILE IRR 1000ML POUR (IV SOLUTION) ×2 IMPLANT
WATER STERILE IRR 500ML POUR (IV SOLUTION) ×2 IMPLANT
YANKAUER SUCT BULB TIP NO VENT (SUCTIONS) ×2 IMPLANT

## 2012-09-23 NOTE — Op Note (Signed)
09/23/2012  11:47 AM  PATIENT:  [name]  27 y.o. female  Patient Care Team: Gardiner Barefoot, MD as PCP - General (Internal Medicine) Gardiner Barefoot, MD as PCP - Infectious Diseases (Infectious Diseases) Adam Phenix, MD as Consulting Physician (Obstetrics and Gynecology)  PRE-OPERATIVE DIAGNOSIS:  AIN III in setting condyloma acuminatum of perianal & labial region  POST-OPERATIVE DIAGNOSIS:  AIN3 in setting condyloma acuminatum of perianal & labial External hemorrhoids  PROCEDURE:  Procedure(s): EXAM UNDER ANESTHESIA REMOVAL/ABLATION  ABLATION CONDYLOMATA  EXTERNAL HEMORRHOIDECTOMY  SURGEON:  Surgeon(s): Ardeth Sportsman, MD  ASSISTANT: none   ANESTHESIA:   local and general  EBL:  Total I/O In: 1000 [I.V.:1000] Out: -   Delay start of Pharmacological VTE agent (>24hrs) due to surgical blood loss or risk of bleeding:  no  DRAINS: none   SPECIMEN:  Source of Specimen:  EXTERNAL HEMORRHOID (RIGHT POSTERIOR).  2. Posterior midline anal mass ? AIN  DISPOSITION OF SPECIMEN:  PATHOLOGY  COUNTS:  YES  PLAN OF CARE: Discharge to home after PACU  PATIENT DISPOSITION:  PACU - hemodynamically stable.  INDICATION:   HIV-positive woman with history of prior condylomata and focus of AIN.  Working to be more compliant with HIV therapy.  Concern for recurrent warts in the perianal region.  I recommended examination and surgical treatment:  The anatomy & physiology of the anorectal region was discussed.  The pathophysiology of anorectal warts and differential diagnosis was discussed.  Natural history risks without surgery was discussed such as further growth and cancer.   I stressed the importance of office follow-up to catch early recurrence & minimize/halt progression of disease.  Interventions such as cauterization by topical agents were discussed.  The patient's symptoms are not adequately controlled by non-operative treatments.  I feel the risks & problems of no surgery  outweigh the operative risks; therefore, I recommended surgery to treat the anal warts by removal, ablation and/or cauterization.  Risks such as bleeding, infection, need for further treatment, heart attack, death, and other risks were discussed.   I noted a good likelihood this will help address the problem. Goals of post-operative recovery were discussed as well.  Possibility that this will not correct all symptoms was explained.  Post-operative pain, bleeding, constipation, and other problems after surgery were discussed.  We will work to minimize complications.   Educational handouts further explaining the pathology, treatment options, and bowel regimen were given as well.  Questions were answered.  The patient expresses understanding & wishes to proceed with surgery.  OR FINDINGS: Hyperpigmented skin changes most likely from prior ablations but some suspicious areas of recurrent condylomata.  Prominent perianal skin folds and external hemorrhoids, right posterior most obvious.  More hard posterior midline nodule 1 cm from the anus and perianal skin.  Some small recurrences on her labia, clitoris and perineal creases  DESCRIPTION:   Informed consent was confirmed. Patient underwent general anesthesia without difficulty. Patient was placed into Lithotomy.  The perineum and perianal region was prepped and draped in sterile fashion. Surgical tunnel confirmed or plan.  I did digital rectal examination and then transitioned over to anoscopy to get a sense of the anatomy. I used acetic acid staining to help delineate scar from suspicious areas.  She had hyperpigmented skin in both peroneal folds and perianally.  The peroneal.  Not seem to be involved.  The perianal region and labia seemed to light up more.   Hypertrophic perianal skin consistent with condylomata on  external hemorrhoids.  I gradually ablated these areas using primarily CO2 laser.  Setting mainly at 7-10 Watts.  We occasionally increased to  15-20 W in the areas of thicker skin.  Needle-tip cautery done for hemostasis as well.  No evidence of disease in the anal canal or rectum.    I ended up doing laser ablation on some old patches of flat scar in the perianal region about 5 cm.  Some suspicious areas on the inner labia and one at the right base of the clitoris.  I carefully ablated these on a lighter laser sliding.  I did meticulous reinspection of the entire region to ensure hemostasis to make sure nothing was missed.  She had stretched out skin, so I was more aggressive, knowing stricture risk was lower.  Hemostasis is good.  The placed lidocaine ointment and fluffs on the region.  Patient extubated and in the recovery room.  I discussed the patient's status to the family.  Questions were answered.  They expressed understanding & appreciation.

## 2012-09-23 NOTE — Anesthesia Preprocedure Evaluation (Signed)
Anesthesia Evaluation  Patient identified by MRN, date of birth, ID band Patient awake    Reviewed: Allergy & Precautions, H&P , NPO status , Patient's Chart, lab work & pertinent test results  Airway Mallampati: II TM Distance: >3 FB Neck ROM: Full    Dental no notable dental hx.    Pulmonary Current Smoker,  breath sounds clear to auscultation  Pulmonary exam normal       Cardiovascular negative cardio ROS  Rhythm:Regular Rate:Normal     Neuro/Psych negative neurological ROS  negative psych ROS   GI/Hepatic negative GI ROS, Neg liver ROS,   Endo/Other  negative endocrine ROS  Renal/GU negative Renal ROS  negative genitourinary   Musculoskeletal negative musculoskeletal ROS (+)   Abdominal   Peds negative pediatric ROS (+)  Hematology  (+) HIV,   Anesthesia Other Findings   Reproductive/Obstetrics negative OB ROS                           Anesthesia Physical Anesthesia Plan  ASA: III  Anesthesia Plan: General   Post-op Pain Management:    Induction: Intravenous  Airway Management Planned: Oral ETT and LMA  Additional Equipment:   Intra-op Plan:   Post-operative Plan: Extubation in OR  Informed Consent: I have reviewed the patients History and Physical, chart, labs and discussed the procedure including the risks, benefits and alternatives for the proposed anesthesia with the patient or authorized representative who has indicated his/her understanding and acceptance.   Dental advisory given  Plan Discussed with: CRNA and Surgeon  Anesthesia Plan Comments:         Anesthesia Quick Evaluation
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
## 2012-09-23 NOTE — Transfer of Care (Signed)
Immediate Anesthesia Transfer of Care Note  Patient: [name]  Procedure(s) Performed: Procedure(s) (LRB): EXAM UNDER ANESTHESIA (N/A) REMOVAL/ABLATION  ABLATION CONDOLAMATA WARTS (N/A)  Patient Location: PACU  Anesthesia Type: General  Level of Consciousness: awake, sedated, patient cooperative and responds to stimulation  Airway & Oxygen Therapy: Patient Spontanous Breathing and Patient connected to face mask oxygen  Post-op Assessment: Report given to PACU RN, Post -op Vital signs reviewed and stable and Patient moving all extremities  Post vital signs: Reviewed and stable  Complications: No apparent anesthesia complications

## 2012-09-23 NOTE — Anesthesia Procedure Notes (Signed)
Procedure Name: LMA Insertion Date/Time: 09/23/2012 9:45 AM Performed by: Jessica Priest Pre-anesthesia Checklist: Patient identified, Emergency Drugs available, Suction available and Patient being monitored Patient Re-evaluated:Patient Re-evaluated prior to inductionOxygen Delivery Method: Circle System Utilized Preoxygenation: Pre-oxygenation with 100% oxygen Intubation Type: IV induction Ventilation: Mask ventilation without difficulty LMA: LMA inserted LMA Size: 3.0 Number of attempts: 1 Airway Equipment and Method: bite block Placement Confirmation: positive ETCO2 Tube secured with: Tape Dental Injury: Teeth and Oropharynx as per pre-operative assessment

## 2012-09-23 NOTE — Interval H&P Note (Signed)
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 After consideration of risks, benefits and other options for treatment, the patient has consented to  Procedure(s): EXAM UNDER ANESTHESIA (N/A) REMOVAL/ABLATION  ABLATION CONDOLAMATA WARTS (N/A) as a surgical intervention .  The patient's history has been reviewed, patient examined, no change in status, stable for surgery.  I have reviewed the patient's chart and labs.  Questions were answered to the patient's satisfaction.     [name]

## 2012-09-23 NOTE — Anesthesia Postprocedure Evaluation (Signed)
  Anesthesia Post-op Note  Patient: [name]  Procedure(s) Performed: Procedure(s) (LRB): EXAM UNDER ANESTHESIA (N/A) REMOVAL/ABLATION  ABLATION CONDOLAMATA WARTS (N/A)  Patient Location: PACU  Anesthesia Type: General  Level of Consciousness: awake and alert   Airway and Oxygen Therapy: Patient Spontanous Breathing  Post-op Pain: mild  Post-op Assessment: Post-op Vital signs reviewed, Patient's Cardiovascular Status Stable, Respiratory Function Stable, Patent Airway and No signs of Nausea or vomiting  Last Vitals:  Filed Vitals:   09/23/12 0805  BP: 117/78  Pulse: 75  Temp: 36.7 C  Resp: 16    Post-op Vital Signs: stable   Complications: No apparent anesthesia complications

## 2012-09-26 ENCOUNTER — Encounter (HOSPITAL_BASED_OUTPATIENT_CLINIC_OR_DEPARTMENT_OTHER): Payer: Self-pay | Admitting: Surgery

## 2012-09-26 ENCOUNTER — Ambulatory Visit: Payer: Self-pay | Admitting: Internal Medicine

## 2012-09-26 LAB — POCT HEMOGLOBIN-HEMACUE: Hemoglobin: 15.8 g/dL — ABNORMAL HIGH (ref 12.0–15.0)

## 2012-10-03 ENCOUNTER — Telehealth (INDEPENDENT_AMBULATORY_CARE_PROVIDER_SITE_OTHER): Payer: Self-pay | Admitting: *Deleted

## 2012-10-03 DIAGNOSIS — A630 Anogenital (venereal) warts: Secondary | ICD-10-CM

## 2012-10-03 MED ORDER — HYDROCORTISONE ACE-PRAMOXINE 1-1 % RE FOAM: 1.0000 | Freq: Two times a day (BID) | RECTAL | Status: DC

## 2012-10-03 NOTE — Telephone Encounter (Signed)
Patient is aware of her f/u appt with Dr. Michaell Cowing on 10/10/12.

## 2012-10-03 NOTE — Telephone Encounter (Signed)
I do need to see her myself in the acute postoperative period.  I think she would benefit from microscopic followup in the distant future.  Still have her come back and see me in clinic soon to help

## 2012-10-03 NOTE — Telephone Encounter (Signed)
Dr. Michaell Cowing responded that it is ok to prescribe Proctofoam for patient to use.  He stated that patient will need to follow up with Dr. Maisie Fus in 3 months.  At this time that schedule is not out so appt cannot be made at this time.  Patient made aware.

## 2012-10-03 NOTE — Telephone Encounter (Signed)
Patient called back and stated the Proctofoam is too expensive.  So patient is asking for a different prescription.

## 2012-10-03 NOTE — Telephone Encounter (Signed)
Patient called to request a refill of Dibucaine ointment.  This RN is unable to find the original order for the Dibucaine ointment however patient is reading it off a prescription label per Dr. Michaell Cowing.  Patient also states she has not had a bowel movement since her procedure on 09/23/12.  Encouraged patient to use Milk of Mag 4 tbls every 6 hours to induce a bowel movement.  Instructed patient to call back after following these directions for 24 hours if a bowel movement has not occurred.

## 2012-10-04 ENCOUNTER — Telehealth (INDEPENDENT_AMBULATORY_CARE_PROVIDER_SITE_OTHER): Payer: Self-pay

## 2012-10-04 DIAGNOSIS — A630 Anogenital (venereal) warts: Secondary | ICD-10-CM

## 2012-10-04 MED ORDER — HYDROCORTISONE 2.5 % RE CREA: TOPICAL_CREAM | Freq: Two times a day (BID) | RECTAL | Status: DC

## 2012-10-04 NOTE — Telephone Encounter (Signed)
LMOM notifying [name] that we switched her rx from Proctofoam to Anusol HC cream phoned to [facility] on [address] after we discussed this with Dr Michaell Cowing. I did advise [name] that she needed to do the warm soakes as many as times as she can get in the tub along with the Anusol HC cream if necessary she can use the Preparation H per Dr Michaell Cowing. I did advise [name] that this area was going to be sore after surgery b/c the area is just really raw from surgery per Dr Michaell Cowing. I advised [name] to call us back if any questions.

## 2012-10-10 ENCOUNTER — Encounter (INDEPENDENT_AMBULATORY_CARE_PROVIDER_SITE_OTHER): Payer: Self-pay | Admitting: Surgery

## 2012-10-10 ENCOUNTER — Ambulatory Visit (INDEPENDENT_AMBULATORY_CARE_PROVIDER_SITE_OTHER): Payer: PRIVATE HEALTH INSURANCE | Admitting: Surgery

## 2012-10-10 VITALS — BP 105/62 | HR 62 | Temp 98.5°F | Resp 16 | Ht 64.0 in | Wt 102.8 lb

## 2012-10-10 DIAGNOSIS — D013 Carcinoma in situ of anus and anal canal: Secondary | ICD-10-CM

## 2012-10-10 NOTE — Patient Instructions (Addendum)
ANORECTAL SURGERY: POST OP INSTRUCTIONS  1. Take your usually prescribed home medications unless otherwise directed. 2. DIET: Follow a light bland diet the first 24 hours after arrival home, such as soup, liquids, crackers, etc.  Be sure to include lots of fluids daily.  Avoid fast food or heavy meals as your are more likely to get nauseated.  Eat a low fat the next few days after surgery.   3. PAIN CONTROL: a. Pain is best controlled by a usual combination of three different methods TOGETHER: i. Ice/Heat ii. Over the counter pain medication iii. Prescription pain medication b. Most patients will experience some swelling and discomfort in the anus/rectal area. and incisions.  Ice packs or heat (30-60 minutes up to 6 times a day) will help. Use ice for the first few days to help decrease swelling and bruising, then switch to heat such as warm towels, sitz baths, warm baths, etc to help relax tight/sore spots and speed recovery.  Some people prefer to use ice alone, heat alone, alternating between ice & heat.  Experiment to what works for you.  Swelling and bruising can take several weeks to resolve.   c. It is helpful to take an over-the-counter pain medication regularly for the first few weeks.  Choose one of the following that works best for you: i. Naproxen (Aleve, etc)  Two 220mg tabs twice a day ii. Ibuprofen (Advil, etc) Three 200mg tabs four times a day (every meal & bedtime) iii. Acetaminophen (Tylenol, etc) 500-650mg four times a day (every meal & bedtime) d. A  prescription for pain medication (such as oxycodone, hydrocodone, etc) should be given to you upon discharge.  Take your pain medication as prescribed.  i. If you are having problems/concerns with the prescription medicine (does not control pain, nausea, vomiting, rash, itching, etc), please call us [phone] to see if we need to switch you to a different pain medicine that will work better for you and/or control your side effect  better. ii. If you need a refill on your pain medication, please contact your pharmacy.  They will contact our office to request authorization. Prescriptions will not be filled after 5 pm or on week-ends. 4. KEEP YOUR BOWELS REGULAR a. The goal is one bowel movement a day b. Avoid getting constipated.  Between the surgery and the pain medications, it is common to experience some constipation.  Increasing fluid intake and taking a fiber supplement (such as Metamucil, Citrucel, FiberCon, MiraLax, etc) 1-2 times a day regularly will usually help prevent this problem from occurring.  A mild laxative (prune juice, Milk of Magnesia, MiraLax, etc) should be taken according to package directions if there are no bowel movements after 48 hours. c. Watch out for diarrhea.  If you have many loose bowel movements, simplify your diet to bland foods & liquids for a few days.  Stop any stool softeners and decrease your fiber supplement.  Switching to mild anti-diarrheal medications (Kayopectate, Pepto Bismol) can help.  If this worsens or does not improve, please call us.  5. Wound Care a. Remove your bandages the day after surgery.  Unless discharge instructions indicate otherwise, leave your bandage dry and in place overnight.  Remove the bandage during your first bowel movement.   b. Allow the wound packing to fall out over the next few days.  You can trim exposed gauze / ribbon as it falls out.  You do not need to repack the wound unless instructed otherwise.  Wear an   absorbent pad or soft cotton gauze in your underwear as needed to catch any drainage and help keep the area  c. Keep the area clean and dry.  Bathe / shower every day.  Keep the area clean by showering / bathing over the incision / wound.   It is okay to soak an open wound to help wash it.  Wet wipes or showers / gentle washing after bowel movements is often less traumatic than regular toilet paper. d. Bonita Quin may have some styrofoam-like soft packing in  the rectum which will come out with the first bowel movement.  e. You will often notice bleeding with bowel movements.  This should slow down by the end of the first week of surgery f. Expect some drainage.  This should slow down, too, by the end of the first week of surgery.  Wear an absorbent pad or soft cotton gauze in your underwear until the drainage stops. 6. ACTIVITIES as tolerated:   a. You may resume regular (light) daily activities beginning the next day-such as daily self-care, walking, climbing stairs-gradually increasing activities as tolerated.  If you can walk 30 minutes without difficulty, it is safe to try more intense activity such as jogging, treadmill, bicycling, low-impact aerobics, swimming, etc. b. Save the most intensive and strenuous activity for last such as sit-ups, heavy lifting, contact sports, etc  Refrain from any heavy lifting or straining until you are off narcotics for pain control.   c. DO NOT PUSH THROUGH PAIN.  Let pain be your guide: If it hurts to do something, don't do it.  Pain is your body warning you to avoid that activity for another week until the pain goes down. d. You may drive when you are no longer taking prescription pain medication, you can comfortably sit for long periods of time, and you can safely maneuver your car and apply brakes. e. Bonita Quin may have sexual intercourse when it is comfortable.  7. FOLLOW UP in our office a. Please call [facility] at [phone] to set up an appointment to see your surgeon in the office for a follow-up appointment approximately 2 weeks after your surgery. b. Make sure that you call for this appointment the day you arrive home to insure a convenient appointment time. 10. IF YOU HAVE DISABILITY OR FAMILY LEAVE FORMS, BRING THEM TO THE OFFICE FOR PROCESSING.  DO NOT GIVE THEM TO YOUR DOCTOR.        WHEN TO CALL us [phone]: 1. Poor pain control 2. Reactions / problems with new medications (rash/itching, nausea,  etc)  3. Fever over 101.5 F (38.5 C) 4. Inability to urinate 5. Nausea and/or vomiting 6. Worsening swelling or bruising 7. Continued bleeding from incision. 8. Increased pain, redness, or drainage from the incision  The clinic staff is available to answer your questions during regular business hours (8:30am-5pm).  Please don't hesitate to call and ask to speak to one of our nurses for clinical concerns.   A surgeon from [facility] is always on call at the hospitals   If you have a medical emergency, go to the nearest emergency room or call 911.    [facility] [address], [address], [place], [place]  [address] ? MAIN: [phone] ? TOLL FREE: [phone] ? FAX [phone] [email or web address]   GETTING TO GOOD BOWEL HEALTH. Irregular bowel habits such as constipation and diarrhea can lead to many problems over time.  Having one soft bowel movement a  day is the most important way to prevent further problems.  The anorectal canal is designed to handle stretching and feces to safely manage our ability to get rid of solid waste (feces, poop, stool) out of our body.  BUT, hard constipated stools can act like ripping concrete bricks and diarrhea can be a burning fire to this very sensitive area of our body, causing inflamed hemorrhoids, anal fissures, increasing risk is perirectal abscesses, abdominal pain/bloating, an making irritable bowel worse.     The goal: ONE SOFT BOWEL MOVEMENT A DAY!  To have soft, regular bowel movements:    Drink at least 8 tall glasses of water a day.     Take plenty of fiber.  Fiber is the undigested part of plant food that passes into the colon, acting s "natures broom" to encourage bowel motility and movement.  Fiber can absorb and hold large amounts of water. This results in a larger, bulkier stool, which is soft and easier to pass. Work gradually over several weeks up to 6 servings a day of fiber (25g a day  even more if needed) in the form of: o Vegetables -- Root (potatoes, carrots, turnips), leafy green (lettuce, salad greens, celery, spinach), or cooked high residue (cabbage, broccoli, etc) o Fruit -- Fresh (unpeeled skin & pulp), Dried (prunes, apricots, cherries, etc ),  or stewed ( applesauce)  o Whole grain breads, pasta, etc (whole wheat)  o Bran cereals    Bulking Agents -- This type of water-retaining fiber generally is easily obtained each day by one of the following:  o Psyllium bran -- The psyllium plant is remarkable because its ground seeds can retain so much water. This product is available as Metamucil, Konsyl, Effersyllium, Per Diem Fiber, or the less expensive generic preparation in drug and health food stores. Although labeled a laxative, it really is not a laxative.  o Methylcellulose -- This is another fiber derived from wood which also retains water. It is available as Citrucel. o Polyethylene Glycol - and "artificial" fiber commonly called Miralax or Glycolax.  It is helpful for people with gassy or bloated feelings with regular fiber o Flax Seed - a less gassy fiber than psyllium   No reading or other relaxing activity while on the toilet. If bowel movements take longer than 5 minutes, you are too constipated   AVOID CONSTIPATION.  High fiber and water intake usually takes care of this.  Sometimes a laxative is needed to stimulate more frequent bowel movements, but    Laxatives are not a good long-term solution as it can wear the colon out. o Osmotics (Milk of Magnesia, Fleets phosphosoda, Magnesium citrate, MiraLax, GoLytely) are safer than  o Stimulants (Senokot, Castor Oil, Dulcolax, Ex Lax)    o Do not take laxatives for more than 7days in a row.    IF SEVERELY CONSTIPATED, try a Bowel Retraining Program: o Do not use laxatives.  o Eat a diet high in roughage, such as bran cereals and leafy vegetables.  o Drink six (6) ounces of prune or apricot juice each morning.   o Eat two (2) large servings of stewed fruit each day.  o Take one (1) heaping tablespoon of a psyllium-based bulking agent twice a day. Use sugar-free sweetener when possible to avoid excessive calories.  o Eat a normal breakfast.  o Set aside 15 minutes after breakfast to sit on the toilet, but do not strain to have a bowel movement.  o If you do not  have a bowel movement by the third day, use an enema and repeat the above steps.    Controlling diarrhea o Switch to liquids and simpler foods for a few days to avoid stressing your intestines further. o Avoid dairy products (especially milk & ice cream) for a short time.  The intestines often can lose the ability to digest lactose when stressed. o Avoid foods that cause gassiness or bloating.  Typical foods include beans and other legumes, cabbage, broccoli, and dairy foods.  Every person has some sensitivity to other foods, so listen to our body and avoid those foods that trigger problems for you. o Adding fiber (Citrucel, Metamucil, psyllium, Miralax) gradually can help thicken stools by absorbing excess fluid and retrain the intestines to act more normally.  Slowly increase the dose over a few weeks.  Too much fiber too soon can backfire and cause cramping & bloating. o Probiotics (such as active yogurt, Align, etc) may help repopulate the intestines and colon with normal bacteria and calm down a sensitive digestive tract.  Most studies show it to be of mild help, though, and such products can be costly. o Medicines:   Bismuth subsalicylate (ex. Kayopectate, Pepto Bismol) every 30 minutes for up to 6 doses can help control diarrhea.  Avoid if pregnant.   Loperamide (Immodium) can slow down diarrhea.  Start with two tablets (4mg  total) first and then try one tablet every 6 hours.  Avoid if you are having fevers or severe pain.  If you are not better or start feeling worse, stop all medicines and call your doctor for advice o Call your doctor if you  are getting worse or not better.  Sometimes further testing (cultures, endoscopy, X-ray studies, bloodwork, etc) may be needed to help diagnose and treat the cause of the diarrhea.  Anal Intraepithelial Neoplasia (AIN) ANAL INTRAEPITHELIAL NEOPLASIA (AIN) Anal Intra-epithelial Neoplasia (AIN) is a pre-cancerous condition of the skin surrounding the anus. In the early stages of AIN there are abnormal skin cells (epithelial cells) in the outer third of the skin (epithelium). This is called AIN1 (also called a low-grade anal squamous intra-epithelial lesion (LSIL). This can progress to AIN2, where the outer 2/3 of the skin contains abnormal cells, which in turn can result in AIN3 where there are abnormal cells involving the full thickness of skin (AIN2 and AIN3 are also called high-grade anal squamous intra-epithelial lesions (HSIL). The next step is for these abnormal cells to cross a barrier at the base of the skin called the basement membrane. Once this occurs, it is invasive cancer (carcinoma).  RISK FACTORS The risk factors for AIN are the same as for anal cancer, and include the immunosuppressed (eg HIV and transplant patients), and those with previous anal warts. PREVENTION The vaccine currently offered to women to reduce the risk of cervical cancer targets the Human Papilloma Virus (HPV) serotypes 16, 18, 6 and 11. Although not approved for this purpose, this vaccine may have a role for reducing the risk of AIN and anal cancer in high risk populations who do not yet have HPV infection. TREATMENT OF AIN Treatment is aimed at eradicating AIN and preventing anal cancer with minimal disturbance to anal function. There is currently no uniform treatment largely due to the uncertain natural history of AIN, the variable extent of disease, and the fact there is not a universally effective treatment modality.  TOPICAL AGENTS Imiquimod cream 5% (Aldara ) can be applied to the area 3 times a day, and  can be  used for up to 16 weeks. It is an immunomodulatory agent and that attacks the virus responsible for warts, but also has anti-tumour effects [1]. There is some evidence that it not only slows the progression of AIN, but causes regression, with one study showing that 3/4 of men with AIN had their AIN completely cleared at the end of treatment[2]. It also has the added benefit in those with warts of causing regression of these, and in some cases eradication of the HPV virus. The main disadvantage is relapse in a quarter of cases after cessation of therapy[1]. Side effects of imiquimod include erythema, "flu?like" illness and erosions, which are usually mild. It should not be applied prior to sexual intercourse. A systematic review showed a noncompliance rate of less than 5% because of side effects, mainly related to incompatibility with sexual life [3-4]. Topical 5-Fluorouracil 5% cream (Efudix) can be applied to the area 1-2 times a day, and the largest study showing a recurrence of high grade AIN in only 8% of patients when used for 9-12 weeks[5]. PHOTODYNAMIC THERAPY Photodynamic therapy (PDT) involves the application of a cream (topical sensitizer) such as 5-Flourouracil (Efudix) and subsequent exposure of the anal region to light and oxygen to generate oxygen intermediates that damage areas with AIN [6]. Although there is little evidence, there is a suggestion that early AIN responds to this treatment [7-8]. INFRARED PHOTOCOAGULATION Infrared photocoagulation is the same as photodynamic therapy except that it only uses light with a wavelength longer than visible light. It's use for AIN was first described by Gwenlyn Fudge and colleagues [9], who showed that up to 2/3 of AIN can be cured and is effective in preventing progression to cancer.  SURGERY The treatment of widespread AIN3 is controversial. Most advocate surgery, however even with surgery there is a one third risk of recurrence [10-11]. If surgery is  performed, it consists of either local excision or extensive excision with split skin grafting. The high recurrence rate with these procedures is thought to be related to ongoing HPV, multi?focal lesions that are not all treated, and a generalised field change. [place] of the high recurrence rate and extensive surgery required for widespread AIN3, many advocate just close surveillance with regular (3-6 monthly) biopsies, so that early invasive anal cancer can be picked up early and treated accordingly. RADIOTHERAPY Radiotherapy has no role for AIN 3. Its only role is for confirmed anal cancer. FOLLOW-UP Follow?up should involve anoscopic examination, with or without the aid of high?resolution anoscopy. AIN 1 should be reviewed every 6?12 months with discharge from follow up upon resolution of AIN. AIN 3, especially in HIV?positive patients should be followed more closely every 3?6 months with or without treatment. The follow?up of AIN 2 is less clear, as the natural history of this condition is so uncertain. A regime somewhere between that of AIN 1 and 3 is advised, with HIV?positive or immunosuppressed patients being followed more like patients with AIN 3.
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
## 2012-10-25 ENCOUNTER — Other Ambulatory Visit: Payer: Self-pay

## 2012-11-02 ENCOUNTER — Encounter (INDEPENDENT_AMBULATORY_CARE_PROVIDER_SITE_OTHER): Payer: Self-pay | Admitting: Surgery

## 2012-11-02 ENCOUNTER — Ambulatory Visit (INDEPENDENT_AMBULATORY_CARE_PROVIDER_SITE_OTHER): Payer: PRIVATE HEALTH INSURANCE | Admitting: Surgery

## 2012-11-02 VITALS — BP 130/74 | HR 86 | Resp 14 | Ht 64.0 in | Wt 101.4 lb

## 2012-11-02 DIAGNOSIS — D013 Carcinoma in situ of anus and anal canal: Secondary | ICD-10-CM

## 2012-11-02 DIAGNOSIS — A630 Anogenital (venereal) warts: Secondary | ICD-10-CM

## 2012-11-02 MED ORDER — HYDROCORTISONE 2.5 % RE CREA: TOPICAL_CREAM | Freq: Two times a day (BID) | RECTAL | Status: DC

## 2012-11-02 NOTE — Patient Instructions (Addendum)
Anal Intraepithelial Neoplasia (AIN) ANAL INTRAEPITHELIAL NEOPLASIA (AIN) Anal Intra-epithelial Neoplasia (AIN) is a pre-cancerous condition of the skin surrounding the anus. In the early stages of AIN there are abnormal skin cells (epithelial cells) in the outer third of the skin (epithelium). This is called AIN1 (also called a low-grade anal squamous intra-epithelial lesion (LSIL). This can progress to AIN2, where the outer 2/3 of the skin contains abnormal cells, which in turn can result in AIN3 where there are abnormal cells involving the full thickness of skin (AIN2 and AIN3 are also called high-grade anal squamous intra-epithelial lesions (HSIL). The next step is for these abnormal cells to cross a barrier at the base of the skin called the basement membrane. Once this occurs, it is invasive cancer (carcinoma).  RISK FACTORS The risk factors for AIN are the same as for anal cancer, and include the immunosuppressed (eg HIV and transplant patients), and those with previous anal warts. PREVENTION The vaccine currently offered to women to reduce the risk of cervical cancer targets the Human Papilloma Virus (HPV) serotypes 16, 18, 6 and 11. Although not approved for this purpose, this vaccine may have a role for reducing the risk of AIN and anal cancer in high risk populations who do not yet have HPV infection. TREATMENT OF AIN Treatment is aimed at eradicating AIN and preventing anal cancer with minimal disturbance to anal function. There is currently no uniform treatment largely due to the uncertain natural history of AIN, the variable extent of disease, and the fact there is not a universally effective treatment modality.  TOPICAL AGENTS Imiquimod cream 5% (Aldara ) can be applied to the area 3 times a day, and can be used for up to 16 weeks. It is an immunomodulatory agent and that attacks the virus responsible for warts, but also has anti-tumour effects [1]. There is some evidence that it not only  slows the progression of AIN, but causes regression, with one study showing that 3/4 of men with AIN had their AIN completely cleared at the end of treatment[2]. It also has the added benefit in those with warts of causing regression of these, and in some cases eradication of the HPV virus. The main disadvantage is relapse in a quarter of cases after cessation of therapy[1]. Side effects of imiquimod include erythema, "flu?like" illness and erosions, which are usually mild. It should not be applied prior to sexual intercourse. A systematic review showed a noncompliance rate of less than 5% because of side effects, mainly related to incompatibility with sexual life [3-4]. Topical 5-Fluorouracil 5% cream (Efudix) can be applied to the area 1-2 times a day, and the largest study showing a recurrence of high grade AIN in only 8% of patients when used for 9-12 weeks[5]. PHOTODYNAMIC THERAPY Photodynamic therapy (PDT) involves the application of a cream (topical sensitizer) such as 5-Flourouracil (Efudix) and subsequent exposure of the anal region to light and oxygen to generate oxygen intermediates that damage areas with AIN [6]. Although there is little evidence, there is a suggestion that early AIN responds to this treatment [7-8]. INFRARED PHOTOCOAGULATION Infrared photocoagulation is the same as photodynamic therapy except that it only uses light with a wavelength longer than visible light. It's use for AIN was first described by Goldstein and colleagues [9], who showed that up to 2/3 of AIN can be cured and is effective in preventing progression to cancer.  SURGERY The treatment of widespread AIN3 is controversial. Most advocate surgery, however even with surgery there is a   one third risk of recurrence [10-11]. If surgery is performed, it consists of either local excision or extensive excision with split skin grafting. The high recurrence rate with these procedures is thought to be related to ongoing HPV,  multi?focal lesions that are not all treated, and a generalised field change. [place] of the high recurrence rate and extensive surgery required for widespread AIN3, many advocate just close surveillance with regular (3-6 monthly) biopsies, so that early invasive anal cancer can be picked up early and treated accordingly. RADIOTHERAPY Radiotherapy has no role for AIN 3. Its only role is for confirmed anal cancer. FOLLOW-UP Follow?up should involve anoscopic examination, with or without the aid of high?resolution anoscopy. AIN 1 should be reviewed every 6?12 months with discharge from follow up upon resolution of AIN. AIN 3, especially in HIV?positive patients should be followed more closely every 3?6 months with or without treatment. The follow?up of AIN 2 is less clear, as the natural history of this condition is so uncertain. A regime somewhere between that of AIN 1 and 3 is advised, with HIV?positive or immunosuppressed patients being followed more like patients with AIN 3.   Genital Warts Genital warts are a sexually transmitted infection. They may appear as small bumps on the tissues of the genital area. CAUSES  Genital warts are caused by a virus called human papillomavirus (HPV). HPV is the most common sexually transmitted disease (STD) and infection of the sex organs. This infection is spread by having unprotected sex with an infected person. It can be spread by vaginal, anal, and oral sex. Many people do not know they are infected. They may be infected for years without problems. However, even if they do not have problems, they can unknowingly pass the infection to their sexual partners. SYMPTOMS   Itching and irritation in the genital area.  Warts that bleed.  Painful sexual intercourse. DIAGNOSIS  Warts are usually recognized with the naked eye on the vagina, vulva, perineum, anus, and rectum. Certain tests can also diagnose genital warts, such as:  A Pap test.  A tissue sample  (biopsy) exam.  Colposcopy. A magnifying tool is used to examine the vagina and cervix. The HPV cells will change color when certain solutions are used. TREATMENT  Warts can be removed by:  Applying certain chemicals, such as cantharidin or podophyllin.  Liquid nitrogen freezing (cryotherapy).  Immunotherapy with candida or trichophyton injections.  Laser treatment.  Burning with an electrified probe (electrocautery).  Interferon injections.  Surgery. PREVENTION  HPV vaccination can help prevent HPV infections that cause genital warts and that cause cancer of the cervix. It is recommended that the vaccination be given to people between the ages 31 to 80 years old. The vaccine might not work as well or might not work at all if you already have HPV. It should not be given to pregnant women. HOME CARE INSTRUCTIONS   It is important to follow your caregiver's instructions. The warts will not go away without treatment. Repeat treatments are often needed to get rid of warts. Even after it appears that the warts are gone, the normal tissue underneath often remains infected.  Do not try to treat genital warts with medicine used to treat hand warts. This type of medicine is strong and can burn the skin in the genital area, causing more damage.  Tell your past and current sexual partner(s) that you have genital warts. They may be infected also and need treatment.  Avoid sexual contact while being treated.  Do not touch or scratch the warts. The infection may spread to other parts of your body.  Women with genital warts should have a cervical cancer check (Pap test) at least once a year. This type of cancer is slow-growing and can be cured if found early. Chances of developing cervical cancer are increased with HPV.  Inform your obstetrician about your warts in the event of pregnancy. This virus can be passed to the baby's respiratory tract. Discuss this with your caregiver.  Use a condom  during sexual intercourse. Following treatment, the use of condoms will help prevent reinfection.  Ask your caregiver about using over-the-counter anti-itch creams. SEEK MEDICAL CARE IF:   Your treated skin becomes red, swollen, or painful.  You have a fever.  You feel generally ill.  You feel little lumps in and around your genital area.  You are bleeding or have painful sexual intercourse. MAKE SURE YOU:   Understand these instructions.  Will watch your condition.  Will get help right away if you are not doing well or get worse. Document Released: 04/10/2000 Document Revised: 07/06/2011 Document Reviewed: 10/20/2010 [facility] Patient Information 2014 [place], [place].  GETTING TO GOOD BOWEL HEALTH. Irregular bowel habits such as constipation and diarrhea can lead to many problems over time.  Having one soft bowel movement a day is the most important way to prevent further problems.  The anorectal canal is designed to handle stretching and feces to safely manage our ability to get rid of solid waste (feces, poop, stool) out of our body.  BUT, hard constipated stools can act like ripping concrete bricks and diarrhea can be a burning fire to this very sensitive area of our body, causing inflamed hemorrhoids, anal fissures, increasing risk is perirectal abscesses, abdominal pain/bloating, an making irritable bowel worse.     The goal: ONE SOFT BOWEL MOVEMENT A DAY!  To have soft, regular bowel movements:    Drink at least 8 tall glasses of water a day.     Take plenty of fiber.  Fiber is the undigested part of plant food that passes into the colon, acting s "natures broom" to encourage bowel motility and movement.  Fiber can absorb and hold large amounts of water. This results in a larger, bulkier stool, which is soft and easier to pass. Work gradually over several weeks up to 6 servings a day of fiber (25g a day even more if needed) in the form of: o Vegetables -- Root (potatoes, carrots,  turnips), leafy green (lettuce, salad greens, celery, spinach), or cooked high residue (cabbage, broccoli, etc) o Fruit -- Fresh (unpeeled skin & pulp), Dried (prunes, apricots, cherries, etc ),  or stewed ( applesauce)  o Whole grain breads, pasta, etc (whole wheat)  o Bran cereals    Bulking Agents -- This type of water-retaining fiber generally is easily obtained each day by one of the following:  o Psyllium bran -- The psyllium plant is remarkable because its ground seeds can retain so much water. This product is available as Metamucil, Konsyl, Effersyllium, Per Diem Fiber, or the less expensive generic preparation in drug and health food stores. Although labeled a laxative, it really is not a laxative.  o Methylcellulose -- This is another fiber derived from wood which also retains water. It is available as Citrucel. o Polyethylene Glycol - and "artificial" fiber commonly called Miralax or Glycolax.  It is helpful for people with gassy or bloated feelings with regular fiber o Flax Seed - a less  gassy fiber than psyllium   No reading or other relaxing activity while on the toilet. If bowel movements take longer than 5 minutes, you are too constipated   AVOID CONSTIPATION.  High fiber and water intake usually takes care of this.  Sometimes a laxative is needed to stimulate more frequent bowel movements, but    Laxatives are not a good long-term solution as it can wear the colon out. o Osmotics (Milk of Magnesia, Fleets phosphosoda, Magnesium citrate, MiraLax, GoLytely) are safer than  o Stimulants (Senokot, Castor Oil, Dulcolax, Ex Lax)    o Do not take laxatives for more than 7days in a row.    IF SEVERELY CONSTIPATED, try a Bowel Retraining Program: o Do not use laxatives.  o Eat a diet high in roughage, such as bran cereals and leafy vegetables.  o Drink six (6) ounces of prune or apricot juice each morning.  o Eat two (2) large servings of stewed fruit each day.  o Take one (1) heaping  tablespoon of a psyllium-based bulking agent twice a day. Use sugar-free sweetener when possible to avoid excessive calories.  o Eat a normal breakfast.  o Set aside 15 minutes after breakfast to sit on the toilet, but do not strain to have a bowel movement.  o If you do not have a bowel movement by the third day, use an enema and repeat the above steps.    Controlling diarrhea o Switch to liquids and simpler foods for a few days to avoid stressing your intestines further. o Avoid dairy products (especially milk & ice cream) for a short time.  The intestines often can lose the ability to digest lactose when stressed. o Avoid foods that cause gassiness or bloating.  Typical foods include beans and other legumes, cabbage, broccoli, and dairy foods.  Every person has some sensitivity to other foods, so listen to our body and avoid those foods that trigger problems for you. o Adding fiber (Citrucel, Metamucil, psyllium, Miralax) gradually can help thicken stools by absorbing excess fluid and retrain the intestines to act more normally.  Slowly increase the dose over a few weeks.  Too much fiber too soon can backfire and cause cramping & bloating. o Probiotics (such as active yogurt, Align, etc) may help repopulate the intestines and colon with normal bacteria and calm down a sensitive digestive tract.  Most studies show it to be of mild help, though, and such products can be costly. o Medicines:   Bismuth subsalicylate (ex. Kayopectate, Pepto Bismol) every 30 minutes for up to 6 doses can help control diarrhea.  Avoid if pregnant.   Loperamide (Immodium) can slow down diarrhea.  Start with two tablets (4mg  total) first and then try one tablet every 6 hours.  Avoid if you are having fevers or severe pain.  If you are not better or start feeling worse, stop all medicines and call your doctor for advice o Call your doctor if you are getting worse or not better.  Sometimes further testing (cultures, endoscopy,  X-ray studies, bloodwork, etc) may be needed to help diagnose and treat the cause of the diarrhea. o
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
## 2012-11-08 ENCOUNTER — Ambulatory Visit (INDEPENDENT_AMBULATORY_CARE_PROVIDER_SITE_OTHER): Payer: No Typology Code available for payment source | Admitting: Internal Medicine

## 2012-11-08 ENCOUNTER — Encounter: Payer: Self-pay | Admitting: Internal Medicine

## 2012-11-08 VITALS — BP 127/71 | HR 85 | Temp 98.3°F | Ht 64.0 in | Wt 100.0 lb

## 2012-11-08 DIAGNOSIS — B20 Human immunodeficiency virus [HIV] disease: Secondary | ICD-10-CM

## 2012-11-08 DIAGNOSIS — R636 Underweight: Secondary | ICD-10-CM

## 2012-11-08 LAB — CBC WITH DIFFERENTIAL/PLATELET
Basophils Absolute: 0 K/uL (ref 0.0–0.1)
Basophils Relative: 0 % (ref 0–1)
Eosinophils Absolute: 0.1 K/uL (ref 0.0–0.7)
Eosinophils Relative: 1 % (ref 0–5)
HCT: 42.8 % (ref 36.0–46.0)
Hemoglobin: 14.9 g/dL (ref 12.0–15.0)
Lymphocytes Relative: 34 % (ref 12–46)
Lymphs Abs: 2.3 K/uL (ref 0.7–4.0)
MCH: 33.8 pg (ref 26.0–34.0)
MCHC: 34.8 g/dL (ref 30.0–36.0)
MCV: 97.1 fL (ref 78.0–100.0)
Monocytes Absolute: 0.6 K/uL (ref 0.1–1.0)
Monocytes Relative: 8 % (ref 3–12)
Neutro Abs: 3.8 K/uL (ref 1.7–7.7)
Neutrophils Relative %: 57 % (ref 43–77)
Platelets: 308 K/uL (ref 150–400)
RBC: 4.41 MIL/uL (ref 3.87–5.11)
RDW: 13.4 % (ref 11.5–15.5)
WBC: 6.8 K/uL (ref 4.0–10.5)

## 2012-11-08 LAB — COMPLETE METABOLIC PANEL WITHOUT GFR
ALT: 14 U/L (ref 0–35)
AST: 18 U/L (ref 0–37)
Albumin: 4.6 g/dL (ref 3.5–5.2)
Alkaline Phosphatase: 63 U/L (ref 39–117)
BUN: 8 mg/dL (ref 6–23)
CO2: 25 meq/L (ref 19–32)
Calcium: 9.8 mg/dL (ref 8.4–10.5)
Chloride: 101 meq/L (ref 96–112)
Creat: 0.75 mg/dL (ref 0.50–1.10)
GFR, Est African American: >89 mL/min
GFR, Est Non African American: >89 mL/min
Glucose, Bld: 76 mg/dL (ref 70–99)
Potassium: 4 meq/L (ref 3.5–5.3)
Sodium: 135 meq/L (ref 135–145)
Total Bilirubin: 1.6 mg/dL — ABNORMAL HIGH (ref 0.3–1.2)
Total Protein: 7.3 g/dL (ref 6.0–8.3)

## 2012-11-08 NOTE — Progress Notes (Signed)
HPI: [name] is a 27 y.o. female here for follow-up.  Allergies: No Known Allergies  Vitals: Temp: 98.3 F (36.8 C) (07/15 1100) BP: 127/71 mmHg (07/15 1100) Pulse Rate: 85 (07/15 1100)  Past Medical History: Past Medical History  Diagnosis Date  . HIV (human immunodeficiency virus infection)   . Depression   . Anemia   . HSV (herpes simplex virus) infection   . History of shingles   . AIN III (anal intraepithelial neoplasia III)   . Condyloma acuminatum in female   . History of chronic bronchitis   . History of esophagitis     CANDIDA  . Periodontitis, chronic     Social History: History   Social History  . Marital Status: Single    Spouse Name: N/A    Number of Children: N/A  . Years of Education: N/A   Social History Main Topics  . Smoking status: Current Every Day Smoker -- 0.50 packs/day for 7 years    Types: Cigarettes  . Smokeless tobacco: Never Used  . Alcohol Use: No  . Drug Use: Yes    Special: Marijuana     Comment: PT STATES SMOKES MARIJUANA DAILY-- UNABLE TO STATE HOW MUCH  . Sexually Active: Not Currently -- Female partner(s)     Comment: pt. declined condoms   Other Topics Concern  . None   Social History Narrative  . None    Current Regimen: Atazanavir/ritonavir, Truvada  Labs: HIV 1 RNA Quant (copies/mL)  Date Value  07/27/2012 47*  05/24/2012 <20   02/22/2012 <20      CD4 T Cell Abs (cmm)  Date Value  07/27/2012 790   05/24/2012 670   02/22/2012 670      Hep B S Ab (no units)  Date Value  04/29/2010 NEG      Hepatitis B Surface Ag (no units)  Date Value  06/21/2006 No      HCV Ab (no units)  Date Value  06/21/2006 No     CrCl: Estimated Creatinine Clearance: 73.9 ml/min (by C-G formula based on Cr of 0.82).  Lipids:    Component Value Date/Time   CHOL 152 02/22/2012 1119   TRIG 75 02/22/2012 1119   HDL 42 02/22/2012 1119   CHOLHDL 3.6 02/22/2012 1119   VLDL 15 02/22/2012 1119   LDLCALC 95 02/22/2012 1119     Assessment: HIV - [name] reports excellent adherence with no missed doses in the past month.  She takes all of her medications around midday with food.  She uses a pill box to help her remember her medications.  Recommendations: Adherence praised and encouraged.  Madolyn Frieze, PharmD Clinical Infectious Disease Pharmacist [facility] 11/08/2012, 11:30 AM

## 2012-11-09 LAB — T-HELPER CELL (CD4) - (RCID CLINIC ONLY): CD4 % Helper T Cell: 31 % — ABNORMAL LOW (ref 33–55)

## 2012-11-09 NOTE — Progress Notes (Signed)
  Subjective:    Patient ID: [name], female    DOB: 07/06/1985, 27 y.o.   MRN: [id number]  HPI Here for follow up of HIV.  Recently underwent surgery for condyloma.  Doing well now.  Reports continued good compliance.  Still with difficulty with weight gain.  No labs prior to this appt.  Some diarrhea.  On Atazanavir, norvir and Truvada.  Also taking megace for appetite but difficulty taking 5 pills.  Has some appetite taking a few pills.     Review of Systems  Constitutional: Positive for appetite change. Negative for fever and fatigue.  HENT: Negative for sore throat and trouble swallowing.   Respiratory: Negative for cough and shortness of breath.   Cardiovascular: Negative for leg swelling.  Gastrointestinal: Positive for diarrhea. Negative for nausea and abdominal pain.  Genitourinary: Negative for genital sores.  Musculoskeletal: Negative for myalgias and arthralgias.  Skin: Negative for rash.  Neurological: Negative for dizziness and headaches.  Hematological: Negative for adenopathy.  Psychiatric/Behavioral: Negative for dysphoric mood.       Objective:   Physical Exam  Constitutional: She appears well-developed and well-nourished. No distress.  HENT:  Mouth/Throat: No oropharyngeal exudate.  Eyes: Right eye exhibits no discharge. Left eye exhibits no discharge. No scleral icterus.  Cardiovascular: Normal rate, regular rhythm and normal heart sounds.   No murmur heard. Pulmonary/Chest: Breath sounds normal. No respiratory distress. She has no wheezes.  Lymphadenopathy:    She has no cervical adenopathy.  Skin: Skin is warm and dry. No rash noted.  Psychiatric: She has a normal mood and affect. Her behavior is normal.          Assessment & Plan:

## 2012-11-09 NOTE — Assessment & Plan Note (Signed)
Encouraged continued po intake even without appetite.  Will continue with megace.

## 2012-11-09 NOTE — Assessment & Plan Note (Signed)
Dong well.  Will check her labs today. RTC in 3 months if ok, otherwise she will be called.

## 2012-11-29 ENCOUNTER — Ambulatory Visit: Payer: No Typology Code available for payment source

## 2012-12-05 ENCOUNTER — Encounter (INDEPENDENT_AMBULATORY_CARE_PROVIDER_SITE_OTHER): Payer: Self-pay | Admitting: Surgery

## 2012-12-05 ENCOUNTER — Ambulatory Visit (INDEPENDENT_AMBULATORY_CARE_PROVIDER_SITE_OTHER): Payer: Self-pay | Admitting: Surgery

## 2012-12-05 VITALS — BP 110/66 | HR 60 | Resp 16 | Ht 64.0 in | Wt 102.0 lb

## 2012-12-05 DIAGNOSIS — A630 Anogenital (venereal) warts: Secondary | ICD-10-CM

## 2012-12-05 DIAGNOSIS — D013 Carcinoma in situ of anus and anal canal: Secondary | ICD-10-CM

## 2012-12-05 MED ORDER — IMIQUIMOD 5 % EX CREA: TOPICAL_CREAM | CUTANEOUS | Status: DC
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
## 2012-12-05 NOTE — Patient Instructions (Addendum)
Anal Intraepithelial Neoplasia (AIN) ANAL INTRAEPITHELIAL NEOPLASIA (AIN) Anal Intra-epithelial Neoplasia (AIN) is a pre-cancerous condition of the skin surrounding the anus. In the early stages of AIN there are abnormal skin cells (epithelial cells) in the outer third of the skin (epithelium). This is called AIN1 (also called a low-grade anal squamous intra-epithelial lesion (LSIL). This can progress to AIN2, where the outer 2/3 of the skin contains abnormal cells, which in turn can result in AIN3 where there are abnormal cells involving the full thickness of skin (AIN2 and AIN3 are also called high-grade anal squamous intra-epithelial lesions (HSIL). The next step is for these abnormal cells to cross a barrier at the base of the skin called the basement membrane. Once this occurs, it is invasive cancer (carcinoma).  RISK FACTORS The risk factors for AIN are the same as for anal cancer, and include the immunosuppressed (eg HIV and transplant patients), and those with previous anal warts. PREVENTION The vaccine currently offered to women to reduce the risk of cervical cancer targets the Human Papilloma Virus (HPV) serotypes 16, 18, 6 and 11. Although not approved for this purpose, this vaccine may have a role for reducing the risk of AIN and anal cancer in high risk populations who do not yet have HPV infection. TREATMENT OF AIN Treatment is aimed at eradicating AIN and preventing anal cancer with minimal disturbance to anal function. There is currently no uniform treatment largely due to the uncertain natural history of AIN, the variable extent of disease, and the fact there is not a universally effective treatment modality.  TOPICAL AGENTS Imiquimod cream 5% (Aldara ) can be applied to the area 3 times a day, and can be used for up to 16 weeks. It is an immunomodulatory agent and that attacks the virus responsible for warts, but also has anti-tumour effects [1]. There is some evidence that it not only  slows the progression of AIN, but causes regression, with one study showing that 3/4 of men with AIN had their AIN completely cleared at the end of treatment[2]. It also has the added benefit in those with warts of causing regression of these, and in some cases eradication of the HPV virus. The main disadvantage is relapse in a quarter of cases after cessation of therapy[1]. Side effects of imiquimod include erythema, "flu?like" illness and erosions, which are usually mild. It should not be applied prior to sexual intercourse. A systematic review showed a noncompliance rate of less than 5% because of side effects, mainly related to incompatibility with sexual life [3-4]. Topical 5-Fluorouracil 5% cream (Efudix) can be applied to the area 1-2 times a day, and the largest study showing a recurrence of high grade AIN in only 8% of patients when used for 9-12 weeks[5]. PHOTODYNAMIC THERAPY Photodynamic therapy (PDT) involves the application of a cream (topical sensitizer) such as 5-Flourouracil (Efudix) and subsequent exposure of the anal region to light and oxygen to generate oxygen intermediates that damage areas with AIN [6]. Although there is little evidence, there is a suggestion that early AIN responds to this treatment [7-8]. INFRARED PHOTOCOAGULATION Infrared photocoagulation is the same as photodynamic therapy except that it only uses light with a wavelength longer than visible light. It's use for AIN was first described by Goldstein and colleagues [9], who showed that up to 2/3 of AIN can be cured and is effective in preventing progression to cancer.  SURGERY The treatment of widespread AIN3 is controversial. Most advocate surgery, however even with surgery there is a   one third risk of recurrence [10-11]. If surgery is performed, it consists of either local excision or extensive excision with split skin grafting. The high recurrence rate with these procedures is thought to be related to ongoing HPV,  multi?focal lesions that are not all treated, and a generalised field change. [place] of the high recurrence rate and extensive surgery required for widespread AIN3, many advocate just close surveillance with regular (3-6 monthly) biopsies, so that early invasive anal cancer can be picked up early and treated accordingly. RADIOTHERAPY Radiotherapy has no role for AIN 3. Its only role is for confirmed anal cancer. FOLLOW-UP Follow?up should involve anoscopic examination, with or without the aid of high?resolution anoscopy. AIN 1 should be reviewed every 6?12 months with discharge from follow up upon resolution of AIN. AIN 3, especially in HIV?positive patients should be followed more closely every 3?6 months with or without treatment. The follow?up of AIN 2 is less clear, as the natural history of this condition is so uncertain. A regime somewhere between that of AIN 1 and 3 is advised, with HIV?positive or immunosuppressed patients being followed more like patients with AIN 3.

## 2012-12-15 ENCOUNTER — Ambulatory Visit: Payer: No Typology Code available for payment source

## 2012-12-15 ENCOUNTER — Other Ambulatory Visit: Payer: Self-pay | Admitting: Internal Medicine

## 2012-12-15 DIAGNOSIS — F332 Major depressive disorder, recurrent severe without psychotic features: Secondary | ICD-10-CM

## 2012-12-15 DIAGNOSIS — F4310 Post-traumatic stress disorder, unspecified: Secondary | ICD-10-CM

## 2012-12-15 MED ORDER — CITALOPRAM HYDROBROMIDE 20 MG PO TABS: 20.0000 mg | ORAL_TABLET | Freq: Every day | ORAL | Status: DC
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
## 2012-12-15 NOTE — Progress Notes (Signed)
Patient notified via voice mail and will let [name] know. [name]

## 2012-12-27 ENCOUNTER — Ambulatory Visit: Payer: Self-pay

## 2012-12-29 ENCOUNTER — Emergency Department (HOSPITAL_COMMUNITY)
Admission: EM | Admit: 2012-12-29 | Discharge: 2012-12-30 | Disposition: A | Discharge status: Home / Self Care | Payer: Self-pay | Attending: Emergency Medicine | Admitting: Emergency Medicine

## 2012-12-29 ENCOUNTER — Encounter (HOSPITAL_COMMUNITY): Payer: Self-pay | Admitting: Emergency Medicine

## 2012-12-29 DIAGNOSIS — F3289 Other specified depressive episodes: Secondary | ICD-10-CM | POA: Insufficient documentation

## 2012-12-29 DIAGNOSIS — A5901 Trichomonal vulvovaginitis: Secondary | ICD-10-CM

## 2012-12-29 DIAGNOSIS — A630 Anogenital (venereal) warts: Secondary | ICD-10-CM

## 2012-12-29 DIAGNOSIS — Z792 Long term (current) use of antibiotics: Secondary | ICD-10-CM | POA: Insufficient documentation

## 2012-12-29 DIAGNOSIS — Z79899 Other long term (current) drug therapy: Secondary | ICD-10-CM | POA: Insufficient documentation

## 2012-12-29 DIAGNOSIS — Z3202 Encounter for pregnancy test, result negative: Secondary | ICD-10-CM | POA: Insufficient documentation

## 2012-12-29 DIAGNOSIS — F329 Major depressive disorder, single episode, unspecified: Secondary | ICD-10-CM | POA: Insufficient documentation

## 2012-12-29 DIAGNOSIS — Z8719 Personal history of other diseases of the digestive system: Secondary | ICD-10-CM | POA: Insufficient documentation

## 2012-12-29 DIAGNOSIS — Z862 Personal history of diseases of the blood and blood-forming organs and certain disorders involving the immune mechanism: Secondary | ICD-10-CM | POA: Insufficient documentation

## 2012-12-29 DIAGNOSIS — Z85048 Personal history of other malignant neoplasm of rectum, rectosigmoid junction, and anus: Secondary | ICD-10-CM | POA: Insufficient documentation

## 2012-12-29 DIAGNOSIS — Z8709 Personal history of other diseases of the respiratory system: Secondary | ICD-10-CM | POA: Insufficient documentation

## 2012-12-29 DIAGNOSIS — F17200 Nicotine dependence, unspecified, uncomplicated: Secondary | ICD-10-CM | POA: Insufficient documentation

## 2012-12-29 DIAGNOSIS — Z21 Asymptomatic human immunodeficiency virus [HIV] infection status: Secondary | ICD-10-CM | POA: Insufficient documentation

## 2012-12-29 DIAGNOSIS — R300 Dysuria: Secondary | ICD-10-CM | POA: Insufficient documentation

## 2012-12-29 LAB — URINALYSIS, ROUTINE W REFLEX MICROSCOPIC
Ketones, ur: NEGATIVE mg/dL
Nitrite: NEGATIVE
Protein, ur: NEGATIVE mg/dL

## 2012-12-29 LAB — POCT PREGNANCY, URINE: Preg Test, Ur: NEGATIVE

## 2012-12-29 MED ORDER — OXYCODONE-ACETAMINOPHEN 5-325 MG PO TABS
1.0000 | ORAL_TABLET | Freq: Once | ORAL | Status: DC
  Filled 2012-12-29: qty 1

## 2012-12-29 MED ORDER — IBUPROFEN 200 MG PO TABS
400.0000 mg | ORAL_TABLET | Freq: Once | ORAL | Status: AC
  Administered 2012-12-30: 400 mg via ORAL
  Filled 2012-12-29: qty 2

## 2012-12-29 NOTE — ED Notes (Signed)
Pt stated she had genital warts removed on the 30th of May and began having vaginal pain again x1 week ago.  Pt reports she is currently menstruating.

## 2012-12-30 LAB — WET PREP, GENITAL

## 2012-12-30 LAB — GC/CHLAMYDIA PROBE AMP
CT Probe RNA: NEGATIVE
GC Probe RNA: NEGATIVE

## 2012-12-30 MED ORDER — HYDROCODONE-ACETAMINOPHEN 5-325 MG PO TABS: 1.0000 | ORAL_TABLET | Freq: Three times a day (TID) | ORAL | Status: DC | PRN

## 2012-12-30 MED ORDER — DOCUSATE SODIUM 100 MG PO CAPS: 100.0000 mg | ORAL_CAPSULE | Freq: Two times a day (BID) | ORAL | Status: DC

## 2012-12-30 MED ORDER — HYDROCORTISONE 2.5 % RE CREA: TOPICAL_CREAM | RECTAL | Status: DC

## 2012-12-30 MED ORDER — METRONIDAZOLE 500 MG PO TABS: 500.0000 mg | ORAL_TABLET | Freq: Two times a day (BID) | ORAL | Status: DC

## 2012-12-30 NOTE — ED Provider Notes (Signed)
CSN: [id number]     Arrival date & time 12/29/12  2216 History   First MD Initiated Contact with Patient 12/29/12 2323     Chief Complaint  Patient presents with  . Vaginal Pain   (Consider location/radiation/quality/duration/timing/severity/associated sxs/prior Treatment) HPI Comments: 27 y/o female with a hx of genital condylomata and HIV presents for burning external vaginal pain x 1 week that has been constant since onset and without alleviating factors. Patient states pain aggravated after long baths. She admits to associated dysuria and some discomfort with defecation. She denies associated fever, abdominal pain, hematuria, vaginal discharge, numbness/tingling, diarrhea, melena, hematochezia, nausea and vomiting. Patient endorses a hx of genital warts which she had surgically removed on May 30th of this year by Dr. Michaell Cowing. Patient had f/u as outpatient 2 weeks ago where she was told everything was healing well. Last CD4 cell count - 720; viral load <20.   Patient is a 27 y.o. female presenting with vaginal pain. The history is provided by the patient. No language interpreter was used.  Vaginal Pain Pertinent negatives include no abdominal pain, fever, nausea or vomiting.    Past Medical History  Diagnosis Date  . HIV (human immunodeficiency virus infection)   . Depression   . Anemia   . HSV (herpes simplex virus) infection   . History of shingles   . AIN III (anal intraepithelial neoplasia III)   . Condyloma acuminatum in female   . History of chronic bronchitis   . History of esophagitis     CANDIDA  . Periodontitis, chronic    Past Surgical History  Procedure Laterality Date  . Dilation and curettage of uterus  2005    MISSED AB  . Examination under anesthesia N/A 09/23/2012    Procedure: EXAM UNDER ANESTHESIA;  Surgeon: Ardeth Sportsman, MD;  Location: [name] [place];  Service: General;  Laterality: N/A;  . Laser ablation condolamata N/A 09/23/2012    Procedure:  REMOVAL/ABLATION  ABLATION CONDOLAMATA WARTS;  Surgeon: Ardeth Sportsman, MD;  Location: [facility] [place];  Service: General;  Laterality: N/A;   History reviewed. No pertinent family history. History  Substance Use Topics  . Smoking status: Current Every Day Smoker -- 0.50 packs/day for 7 years    Types: Cigarettes  . Smokeless tobacco: Never Used  . Alcohol Use: No   OB History   Grav Para Term Preterm Abortions TAB SAB Ect Mult Living   2 0 0  2  1 1   0     Review of Systems  Constitutional: Negative for fever.  Gastrointestinal: Positive for rectal pain (discomfort when having bowel movements). Negative for nausea, vomiting, abdominal pain, diarrhea, blood in stool and anal bleeding.  Genitourinary: Positive for dysuria and vaginal pain. Negative for hematuria and vaginal discharge.  All other systems reviewed and are negative.   Allergies  Review of patient's allergies indicates no known allergies.  Home Medications   Current Outpatient Rx  Name  Route  Sig  Dispense  Refill  . atazanavir (REYATAZ) 300 MG capsule   Oral   Take 300 mg by mouth daily with breakfast. TAKE 1 CAPSULE BY MOUTH DAILY WITH BREAKFAST. TAKE WITH NORVIR         . citalopram (CELEXA) 20 MG tablet   Oral   Take 1 tablet (20 mg total) by mouth daily.   30 tablet   5   . emtricitabine-tenofovir (TRUVADA) 200-300 MG per tablet   Oral   Take 1  tablet by mouth daily. TAKE 1 TABLET BY MOUTH DAILY         . imiquimod (ALDARA) 5 % cream      Apply to affected area three times weekly   12 each   1   . ritonavir (NORVIR) 100 MG TABS      100 mg daily with breakfast. TAKE 1 TABLET BY MOUTH DAILY . TAKE WITH REYATAZ         . docusate sodium (COLACE) 100 MG capsule   Oral   Take 1 capsule (100 mg total) by mouth every 12 (twelve) hours.   30 capsule   0   . HYDROcodone-acetaminophen (NORCO/VICODIN) 5-325 MG per tablet   Oral   Take 1 tablet by mouth every 8 (eight) hours as  needed for pain.   7 tablet   0   . hydrocortisone (ANUSOL-HC) 2.5 % rectal cream      Apply rectally 2 times daily   30 g   0   . metroNIDAZOLE (FLAGYL) 500 MG tablet   Oral   Take 1 tablet (500 mg total) by mouth 2 (two) times daily.   14 tablet   0    BP 128/84  Pulse 70  Temp(Src) 98.7 F (37.1 C) (Oral)  Resp 16  Ht 5\' 4"  (1.626 m)  Wt 100 lb (45.36 kg)  BMI 17.16 kg/m2  SpO2 100%  LMP 12/29/2012  Physical Exam  Nursing note and vitals reviewed. Constitutional: She is oriented to person, place, and time. She appears well-developed and well-nourished. No distress.  HENT:  Head: Normocephalic and atraumatic.  Eyes: Conjunctivae and EOM are normal. No scleral icterus.  Neck: Normal range of motion.  Cardiovascular: Normal rate, regular rhythm and intact distal pulses.   Pulmonary/Chest: Effort normal. No respiratory distress.  Abdominal: Soft. She exhibits no mass. There is no tenderness. There is no rebound and no guarding.  Genitourinary: Uterus normal. Rectal exam shows tenderness. Rectal exam shows no external hemorrhoid, no internal hemorrhoid, no fissure and anal tone normal. There is tenderness and lesion on the right labia. There is no injury on the right labia. There is tenderness and lesion on the left labia. There is no injury on the left labia. Cervix exhibits no motion tenderness, no discharge and no friability. Right adnexum displays no mass, no tenderness and no fullness. Left adnexum displays no mass, no tenderness and no fullness. No erythema or tenderness around the vagina.  Vesicular lesions on mildly erythematous base on the labia majora b/l extending to the perineum and perirectal region. +TTP. No weeping or discharge. Normal rectal tone.  Musculoskeletal: Normal range of motion.  Neurological: She is alert and oriented to person, place, and time.  Skin: Skin is warm and dry. No rash noted. She is not diaphoretic. No erythema. No pallor.  Psychiatric:  She has a normal mood and affect. Her behavior is normal.   ED Course  Procedures (including critical care time) Labs Review Labs Reviewed  WET PREP, GENITAL - Abnormal; Notable for the following:    Trich, Wet Prep MANY (*)    Clue Cells Wet Prep HPF POC MANY (*)    WBC, Wet Prep HPF POC MANY (*)    All other components within normal limits  URINALYSIS, ROUTINE W REFLEX MICROSCOPIC - Abnormal; Notable for the following:    Hgb urine dipstick MODERATE (*)    Leukocytes, UA TRACE (*)    All other components within normal limits  URINE MICROSCOPIC-ADD ON -  Abnormal; Notable for the following:    Squamous Epithelial / LPF MANY (*)    All other components within normal limits  GC/CHLAMYDIA PROBE AMP  POCT PREGNANCY, URINE   Imaging Review No results found.  MDM   1. Genital warts   2. Trichomonas vaginitis    Patient with hx of genital warts presents for burning pain around her vagina and anus x 1 week. Patient well and nontoxic appearing, hemodynamically stable, and afebrile. There is no TTP of the vaginal canal, cervix, or adnexa. Skin findings around labia majora, perineum, and perianal area c/w genital condylomata. UA findings non-suggestive of infection; bacteria rate, trace leuks, and negative nitrites. Wet prep findings significant for trichomonas vaginitis. Believe patient is stable for d/c with general surgery f/u for further evaluation of symptoms. Colace, Anusol-HC, and Norco prescribed for symptoms. Rx for flagyl given for trichomonas vaginitis. Return precautions advised and patient agreeable to plan with no unaddressed concerns.    Antony Madura, PA-C 12/30/12 [phone]

## 2013-01-02 NOTE — ED Provider Notes (Signed)
Medical screening examination/treatment/procedure(s) were performed by non-physician practitioner and as supervising physician I was immediately available for consultation/collaboration.   Claudean Kinds, MD 01/02/13 [phone]

## 2013-01-10 ENCOUNTER — Encounter (INDEPENDENT_AMBULATORY_CARE_PROVIDER_SITE_OTHER): Payer: Self-pay | Admitting: Surgery

## 2013-01-12 ENCOUNTER — Ambulatory Visit: Payer: Self-pay

## 2013-01-12 DIAGNOSIS — F331 Major depressive disorder, recurrent, moderate: Secondary | ICD-10-CM

## 2013-01-12 NOTE — Progress Notes (Signed)
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  She reports that she has started taking Celexa, prescribed by Dr. Luciana Axe after our last visit and she began feeling better after one week.  She said she has not been sleeping well, but is taking the Celexa at night.  I looked this up and mentioned that most people take it in the morning.  I also suggested melatonin for sleep.  I explained the basics of cognitive behavioral therapy, how thoughts affect feelings and gave her a copy of Lawerance Bach' list of cognitive distortions.

## 2013-01-13 ENCOUNTER — Ambulatory Visit: Payer: Self-pay

## 2013-01-19 ENCOUNTER — Ambulatory Visit: Payer: Self-pay

## 2013-01-23 ENCOUNTER — Encounter (INDEPENDENT_AMBULATORY_CARE_PROVIDER_SITE_OTHER): Payer: Self-pay | Admitting: Surgery

## 2013-02-14 ENCOUNTER — Other Ambulatory Visit: Payer: Self-pay | Admitting: *Deleted

## 2013-02-14 ENCOUNTER — Telehealth: Payer: Self-pay | Admitting: *Deleted

## 2013-02-14 DIAGNOSIS — B20 Human immunodeficiency virus [HIV] disease: Secondary | ICD-10-CM

## 2013-02-14 MED ORDER — EMTRICITABINE-TENOFOVIR DF 200-300 MG PO TABS
1.0000 | ORAL_TABLET | Freq: Every day | ORAL | Status: DC
Start: 2013-02-14 — End: 2013-03-22

## 2013-02-14 MED ORDER — ATAZANAVIR SULFATE 300 MG PO CAPS: 300.0000 mg | ORAL_CAPSULE | Freq: Every day | ORAL | Status: DC

## 2013-02-14 MED ORDER — RITONAVIR 100 MG PO TABS: 100.0000 mg | ORAL_TABLET | Freq: Every day | ORAL | Status: DC

## 2013-02-14 NOTE — Telephone Encounter (Signed)
[name] came today and applied for ADAP.  She is out of her medications.  I called [facility] and got her an emergency supply.  We had Reyataz and Norvir.  She was given one bottle of each.

## 2013-02-16 ENCOUNTER — Ambulatory Visit: Payer: No Typology Code available for payment source | Admitting: Internal Medicine

## 2013-02-23 ENCOUNTER — Ambulatory Visit: Payer: No Typology Code available for payment source | Admitting: Internal Medicine

## 2013-03-06 ENCOUNTER — Encounter (INDEPENDENT_AMBULATORY_CARE_PROVIDER_SITE_OTHER): Payer: Self-pay | Admitting: General Surgery

## 2013-03-07 ENCOUNTER — Ambulatory Visit: Payer: Self-pay | Admitting: Internal Medicine

## 2013-03-10 ENCOUNTER — Encounter (INDEPENDENT_AMBULATORY_CARE_PROVIDER_SITE_OTHER): Payer: Self-pay | Admitting: General Surgery

## 2013-03-21 ENCOUNTER — Encounter: Payer: Self-pay | Admitting: Internal Medicine

## 2013-03-21 ENCOUNTER — Ambulatory Visit (INDEPENDENT_AMBULATORY_CARE_PROVIDER_SITE_OTHER): Payer: Self-pay | Admitting: Internal Medicine

## 2013-03-21 VITALS — BP 135/73 | HR 69 | Temp 98.1°F | Wt 107.0 lb

## 2013-03-21 DIAGNOSIS — F329 Major depressive disorder, single episode, unspecified: Secondary | ICD-10-CM

## 2013-03-21 DIAGNOSIS — Z23 Encounter for immunization: Secondary | ICD-10-CM

## 2013-03-21 DIAGNOSIS — Z79899 Other long term (current) drug therapy: Secondary | ICD-10-CM

## 2013-03-21 DIAGNOSIS — L218 Other seborrheic dermatitis: Secondary | ICD-10-CM

## 2013-03-21 DIAGNOSIS — B20 Human immunodeficiency virus [HIV] disease: Secondary | ICD-10-CM

## 2013-03-21 DIAGNOSIS — R636 Underweight: Secondary | ICD-10-CM

## 2013-03-21 DIAGNOSIS — F3289 Other specified depressive episodes: Secondary | ICD-10-CM

## 2013-03-21 DIAGNOSIS — Z113 Encounter for screening for infections with a predominantly sexual mode of transmission: Secondary | ICD-10-CM

## 2013-03-21 DIAGNOSIS — L219 Seborrheic dermatitis, unspecified: Secondary | ICD-10-CM | POA: Insufficient documentation

## 2013-03-21 LAB — COMPLETE METABOLIC PANEL WITH GFR
ALT: 10 U/L (ref 0–35)
Alkaline Phosphatase: 70 U/L (ref 39–117)
BUN: 9 mg/dL (ref 6–23)
Calcium: 8.9 mg/dL (ref 8.4–10.5)
Creat: 0.79 mg/dL (ref 0.50–1.10)
GFR, Est African American: >89 mL/min
Glucose, Bld: 80 mg/dL (ref 70–99)
Total Bilirubin: 0.6 mg/dL (ref 0.3–1.2)
Total Protein: 6.8 g/dL (ref 6.0–8.3)

## 2013-03-21 LAB — CBC WITH DIFFERENTIAL/PLATELET
Basophils Relative: 0 % (ref 0–1)
Eosinophils Absolute: 0.2 10*3/uL (ref 0.0–0.7)
HCT: 40.3 % (ref 36.0–46.0)
Hemoglobin: 14.1 g/dL (ref 12.0–15.0)
MCH: 32.9 pg (ref 26.0–34.0)
MCHC: 35 g/dL (ref 30.0–36.0)
Monocytes Absolute: 0.3 10*3/uL (ref 0.1–1.0)
Monocytes Relative: 7 % (ref 3–12)
Neutrophils Relative %: 56 % (ref 43–77)
Platelets: 304 10*3/uL (ref 150–400)

## 2013-03-21 LAB — LIPID PANEL
Cholesterol: 157 mg/dL (ref 0–200)
LDL Cholesterol: 96 mg/dL (ref 0–99)
VLDL: 19 mg/dL (ref 0–40)

## 2013-03-21 NOTE — Assessment & Plan Note (Signed)
Improving. Will follow 

## 2013-03-21 NOTE — Assessment & Plan Note (Signed)
History seems like this, will try [facility]

## 2013-03-21 NOTE — Progress Notes (Signed)
  Subjective:    Patient ID: [name], female    DOB: 11/24/1985, 27 y.o.   MRN: [id number]  HPI  Here for follow up of HIV.  Doing well now.  Reports continued good compliance.  Recently got a full time job and has gained weight now that her stress has improved.  No labs prior to this appt.  On Atazanavir, norvir and Truvada.  Still with rash on face, not improved with topical medicine.     Review of Systems  Constitutional: Positive for appetite change. Negative for fever and fatigue.       Improved appetite  HENT: Negative for sore throat and trouble swallowing.   Eyes: Negative for visual disturbance.  Respiratory: Negative for cough and shortness of breath.   Cardiovascular: Negative for leg swelling.  Gastrointestinal: Negative for nausea, abdominal pain and diarrhea.  Genitourinary: Negative for genital sores.  Musculoskeletal: Negative for arthralgias and myalgias.  Skin: Negative for rash.  Neurological: Negative for dizziness and headaches.  Hematological: Negative for adenopathy.  Psychiatric/Behavioral: Negative for dysphoric mood.       Objective:   Physical Exam  Constitutional: She appears well-developed and well-nourished. No distress.  HENT:  Mouth/Throat: No oropharyngeal exudate.  Eyes: Right eye exhibits no discharge. Left eye exhibits no discharge. No scleral icterus.  Cardiovascular: Normal rate, regular rhythm and normal heart sounds.   No murmur heard. Pulmonary/Chest: Breath sounds normal. No respiratory distress. She has no wheezes.  Lymphadenopathy:    She has no cervical adenopathy.  Neurological: She is alert.  Skin: Skin is warm and dry. No rash noted.  Psychiatric: She has a normal mood and affect. Her behavior is normal.          Assessment & Plan:

## 2013-03-21 NOTE — Assessment & Plan Note (Signed)
Improved now that she has a job and feels wel.

## 2013-03-21 NOTE — Assessment & Plan Note (Signed)
Will check labs today, anticipate no issues but she will be called with concerns

## 2013-03-22 ENCOUNTER — Other Ambulatory Visit: Payer: Self-pay | Admitting: *Deleted

## 2013-03-22 DIAGNOSIS — B20 Human immunodeficiency virus [HIV] disease: Secondary | ICD-10-CM

## 2013-03-22 LAB — T-HELPER CELL (CD4) - (RCID CLINIC ONLY)
CD4 % Helper T Cell: 36 % (ref 33–55)
CD4 T Cell Abs: 660 /uL (ref 400–2700)

## 2013-03-22 LAB — HIV-1 RNA QUANT-NO REFLEX-BLD: HIV 1 RNA Quant: <20 copies/mL (ref <20)

## 2013-03-22 MED ORDER — RITONAVIR 100 MG PO TABS: 100.0000 mg | ORAL_TABLET | Freq: Every day | ORAL | Status: DC

## 2013-03-22 MED ORDER — EMTRICITABINE-TENOFOVIR DF 200-300 MG PO TABS: 1.0000 | ORAL_TABLET | Freq: Every day | ORAL | Status: DC

## 2013-03-22 MED ORDER — ATAZANAVIR SULFATE 300 MG PO CAPS
300.0000 mg | ORAL_CAPSULE | Freq: Every day | ORAL | Status: DC
Start: 2013-03-22 — End: 2013-08-21

## 2013-04-27 ENCOUNTER — Inpatient Hospital Stay (HOSPITAL_COMMUNITY): Payer: Self-pay

## 2013-04-27 ENCOUNTER — Inpatient Hospital Stay (HOSPITAL_COMMUNITY)
Admission: AD | Admit: 2013-04-27 | Discharge: 2013-04-27 | Disposition: A | Discharge status: Home / Self Care | Payer: Self-pay | Source: Ambulatory Visit | Attending: Obstetrics & Gynecology | Admitting: Obstetrics & Gynecology

## 2013-04-27 ENCOUNTER — Encounter (HOSPITAL_COMMUNITY): Payer: Self-pay | Admitting: *Deleted

## 2013-04-27 DIAGNOSIS — A599 Trichomoniasis, unspecified: Secondary | ICD-10-CM

## 2013-04-27 DIAGNOSIS — O009 Unspecified ectopic pregnancy without intrauterine pregnancy: Secondary | ICD-10-CM

## 2013-04-27 DIAGNOSIS — O00109 Unspecified tubal pregnancy without intrauterine pregnancy: Secondary | ICD-10-CM | POA: Insufficient documentation

## 2013-04-27 DIAGNOSIS — A5901 Trichomonal vulvovaginitis: Secondary | ICD-10-CM | POA: Insufficient documentation

## 2013-04-27 DIAGNOSIS — N831 Corpus luteum cyst of ovary, unspecified side: Secondary | ICD-10-CM | POA: Insufficient documentation

## 2013-04-27 LAB — URINALYSIS, ROUTINE W REFLEX MICROSCOPIC
Bilirubin Urine: NEGATIVE
Glucose, UA: NEGATIVE mg/dL
Ketones, ur: NEGATIVE mg/dL
Leukocytes, UA: NEGATIVE
Nitrite: NEGATIVE
PROTEIN: NEGATIVE mg/dL
Specific Gravity, Urine: 1.025 (ref 1.005–1.030)
UROBILINOGEN UA: 0.2 mg/dL (ref 0.0–1.0)
pH: 6 (ref 5.0–8.0)

## 2013-04-27 LAB — COMPREHENSIVE METABOLIC PANEL
ALBUMIN: 3.4 g/dL — AB (ref 3.5–5.2)
ALT: 11 U/L (ref 0–35)
AST: 16 U/L (ref 0–37)
Alkaline Phosphatase: 80 U/L (ref 39–117)
BUN: 7 mg/dL (ref 6–23)
CALCIUM: 9.1 mg/dL (ref 8.4–10.5)
CO2: 21 mEq/L (ref 19–32)
Chloride: 100 mEq/L (ref 96–112)
Creatinine, Ser: 0.59 mg/dL (ref 0.50–1.10)
GFR calc Af Amer: >90 mL/min (ref >90)
GFR calc non Af Amer: >90 mL/min (ref >90)
Glucose, Bld: 81 mg/dL (ref 70–99)
Potassium: 3.7 mEq/L (ref 3.7–5.3)
Sodium: 135 mEq/L — ABNORMAL LOW (ref 137–147)
TOTAL PROTEIN: 7.5 g/dL (ref 6.0–8.3)
Total Bilirubin: 2 mg/dL — ABNORMAL HIGH (ref 0.3–1.2)

## 2013-04-27 LAB — URINE MICROSCOPIC-ADD ON

## 2013-04-27 LAB — CBC
HEMATOCRIT: 34.9 % — AB (ref 36.0–46.0)
Hemoglobin: 12.3 g/dL (ref 12.0–15.0)
MCH: 31.9 pg (ref 26.0–34.0)
MCHC: 35.2 g/dL (ref 30.0–36.0)
MCV: 90.4 fL (ref 78.0–100.0)
PLATELETS: 240 10*3/uL (ref 150–400)
RBC: 3.86 MIL/uL — AB (ref 3.87–5.11)
RDW: 12.6 % (ref 11.5–15.5)
WBC: 5.9 10*3/uL (ref 4.0–10.5)

## 2013-04-27 LAB — WET PREP, GENITAL: Yeast Wet Prep HPF POC: NONE SEEN

## 2013-04-27 LAB — POCT PREGNANCY, URINE: PREG TEST UR: POSITIVE — AB

## 2013-04-27 LAB — HCG, QUANTITATIVE, PREGNANCY: hCG, Beta Chain, Quant, S: 4931 m[IU]/mL — ABNORMAL HIGH (ref <5)

## 2013-04-27 MED ORDER — METRONIDAZOLE 500 MG PO TABS
2000.0000 mg | ORAL_TABLET | Freq: Once | ORAL | Status: AC
  Administered 2013-04-27: 2000 mg via ORAL
  Filled 2013-04-27: qty 4

## 2013-04-27 MED ORDER — METHOTREXATE INJECTION FOR WOMEN'S HOSPITAL
50.0000 mg/m2 | Freq: Once | INTRAMUSCULAR | Status: AC
  Administered 2013-04-27: 70 mg via INTRAMUSCULAR
  Filled 2013-04-27: qty 1.4

## 2013-04-27 MED ORDER — RHO D IMMUNE GLOBULIN 1500 UNIT/2ML IJ SOLN
300.0000 ug | Freq: Once | INTRAMUSCULAR | Status: AC
  Administered 2013-04-27: 300 ug via INTRAMUSCULAR
  Filled 2013-04-27: qty 2

## 2013-04-27 NOTE — MAU Note (Signed)
Patient states she had had 2 positive home pregnancy tests yesterday and this am. States she is HIV positive and wants to know how far she is. Has a history of 2 ectopic pregnancies with MTX. States she had a little spotting this am but none now. Denies any pain.

## 2013-04-27 NOTE — MAU Provider Note (Signed)
History     CSN: [id number]  Arrival date and time: 04/27/13 1120   First Provider Initiated Contact with Patient 04/27/13 1227      Chief Complaint  Patient presents with  . Possible Pregnancy   HPI  Pt is a 28 yo G3P0020 at [redacted]w[redacted]d weeks IUP here with report of having 2 positive home pregnancy tests yesterday and this am. Reports having some spotting of blood this morning.  Denies any pelvic pain.  Patient's last menstrual period was 03/21/2013.    Past Medical History  Diagnosis Date  . HIV (human immunodeficiency virus infection)   . Depression   . Anemia   . HSV (herpes simplex virus) infection   . History of shingles   . AIN III (anal intraepithelial neoplasia III)   . Condyloma acuminatum in female   . History of chronic bronchitis   . History of esophagitis     CANDIDA  . Periodontitis, chronic     Past Surgical History  Procedure Laterality Date  . Dilation and curettage of uterus  2005    MISSED AB  . Examination under anesthesia N/A 09/23/2012    Procedure: EXAM UNDER ANESTHESIA;  Surgeon: Adin Hector, MD;  Location: [facility];  Service: General;  Laterality: N/A;  . Laser ablation condolamata N/A 09/23/2012    Procedure: REMOVAL/ABLATION  ABLATION CONDOLAMATA WARTS;  Surgeon: Adin Hector, MD;  Location: [facility];  Service: General;  Laterality: N/A;    History reviewed. No pertinent family history.  History  Substance Use Topics  . Smoking status: Current Every Day Smoker -- 0.50 packs/day for 7 years    Types: Cigarettes  . Smokeless tobacco: Never Used  . Alcohol Use: No    Allergies: No Known Allergies  Prescriptions prior to admission  Medication Sig Dispense Refill  . atazanavir (REYATAZ) 300 MG capsule Take 1 capsule (300 mg total) by mouth daily with breakfast. TAKE 1 CAPSULE BY MOUTH DAILY WITH BREAKFAST. TAKE WITH NORVIR  30 capsule  6  . emtricitabine-tenofovir (TRUVADA) 200-300 MG per tablet Take 1  tablet by mouth daily. TAKE 1 TABLET BY MOUTH DAILY  30 tablet  6  . Polyvinyl Alcohol-Povidone (CLEAR EYES ALL SEASONS) 5-6 MG/ML SOLN Apply 2 drops to eye as needed (dry eyes).      . ritonavir (NORVIR) 100 MG TABS tablet Take 1 tablet (100 mg total) by mouth daily with breakfast. TAKE 1 TABLET BY MOUTH DAILY . TAKE WITH REYATAZ  30 tablet  6    Review of Systems  Gastrointestinal: Positive for nausea. Negative for vomiting and abdominal pain.  Genitourinary:       Spotting of blood  All other systems reviewed and are negative.   Physical Exam   Blood pressure 110/70, pulse 80, temperature 98.6 F (37 C), temperature source Oral, resp. rate 16, height 5\' 3"  (1.6 m), weight 46.358 kg (102 lb 3.2 oz), last menstrual period 03/21/2013, SpO2 100.00%.  Physical Exam  Constitutional: She is oriented to person, place, and time. She appears well-developed and well-nourished. No distress.  HENT:  Head: Normocephalic.  Neck: Normal range of motion. Neck supple.  Cardiovascular: Normal rate, regular rhythm and normal heart sounds.   Respiratory: Effort normal and breath sounds normal. No respiratory distress.  GI: Soft. She exhibits no mass. There is no tenderness. There is no rebound and no guarding.  Genitourinary: Right adnexum displays no mass, no tenderness and no fullness. Left adnexum displays tenderness. Left  adnexum displays no mass and no fullness. No bleeding around the vagina. Vaginal discharge (clear, mucusy) found.  Musculoskeletal: Normal range of motion.  Neurological: She is alert and oriented to person, place, and time.  Skin: Skin is warm and dry.   4 Consulted with Dr. Harolyn Rutherford > reviewed med/OB history/exam/labs/ultrasound > offer MTX   MAU Course  Procedures  Results for orders placed during the hospital encounter of 04/27/13 (from the past 24 hour(s))  URINALYSIS, ROUTINE W REFLEX MICROSCOPIC     Status: Abnormal   Collection Time    04/27/13 11:30 AM       Result Value Range   Color, Urine YELLOW  YELLOW   APPearance CLEAR  CLEAR   Specific Gravity, Urine 1.025  1.005 - 1.030   pH 6.0  5.0 - 8.0   Glucose, UA NEGATIVE  NEGATIVE mg/dL   Hgb urine dipstick TRACE (*) NEGATIVE   Bilirubin Urine NEGATIVE  NEGATIVE   Ketones, ur NEGATIVE  NEGATIVE mg/dL   Protein, ur NEGATIVE  NEGATIVE mg/dL   Urobilinogen, UA 0.2  0.0 - 1.0 mg/dL   Nitrite NEGATIVE  NEGATIVE   Leukocytes, UA NEGATIVE  NEGATIVE  URINE MICROSCOPIC-ADD ON     Status: Abnormal   Collection Time    04/27/13 11:30 AM      Result Value Range   Squamous Epithelial / LPF FEW (*) RARE   WBC, UA 0-2  <3 WBC/hpf   RBC / HPF 0-2  <3 RBC/hpf   Bacteria, UA FEW (*) RARE   Urine-Other TRICHOMONAS PRESENT    POCT PREGNANCY, URINE     Status: Abnormal   Collection Time    04/27/13 11:48 AM      Result Value Range   Preg Test, Ur POSITIVE (*) NEGATIVE  CBC     Status: Abnormal   Collection Time    04/27/13 12:36 PM      Result Value Range   WBC 5.9  4.0 - 10.5 K/uL   RBC 3.86 (*) 3.87 - 5.11 MIL/uL   Hemoglobin 12.3  12.0 - 15.0 g/dL   HCT 34.9 (*) 36.0 - 46.0 %   MCV 90.4  78.0 - 100.0 fL   MCH 31.9  26.0 - 34.0 pg   MCHC 35.2  30.0 - 36.0 g/dL   RDW 12.6  11.5 - 15.5 %   Platelets 240  150 - 400 K/uL  HCG, QUANTITATIVE, PREGNANCY     Status: Abnormal   Collection Time    04/27/13 12:36 PM      Result Value Range   hCG, Beta Chain, Quant, S 4931 (*) <5 mIU/mL  RH IG WORKUP (INCLUDES ABO/RH)     Status: None   Collection Time    04/27/13 12:36 PM      Result Value Range   Gestational Age(Wks) 5     ABO/RH(D) O NEG     Antibody Screen NEG     Unit Number [id number]     Blood Component Type RHIG     Unit division 00     Status of Unit ISSUED     Transfusion Status OK TO TRANSFUSE    COMPREHENSIVE METABOLIC PANEL     Status: Abnormal   Collection Time    04/27/13 12:36 PM      Result Value Range   Sodium 135 (*) 137 - 147 mEq/L   Potassium 3.7  3.7 - 5.3 mEq/L    Chloride 100  96 - 112 mEq/L  CO2 21  19 - 32 mEq/L   Glucose, Bld 81  70 - 99 mg/dL   BUN 7  6 - 23 mg/dL   Creatinine, Ser 0.59  0.50 - 1.10 mg/dL   Calcium 9.1  8.4 - 10.5 mg/dL   Total Protein 7.5  6.0 - 8.3 g/dL   Albumin 3.4 (*) 3.5 - 5.2 g/dL   AST 16  0 - 37 U/L   ALT 11  0 - 35 U/L   Alkaline Phosphatase 80  39 - 117 U/L   Total Bilirubin 2.0 (*) 0.3 - 1.2 mg/dL   GFR calc non Af Amer >90  >90 mL/min   GFR calc Af Amer >90  >90 mL/min  WET PREP, GENITAL     Status: Abnormal   Collection Time    04/27/13 12:53 PM      Result Value Range   Yeast Wet Prep HPF POC NONE SEEN  NONE SEEN   Trich, Wet Prep FEW (*) NONE SEEN   Clue Cells Wet Prep HPF POC MODERATE (*) NONE SEEN   WBC, Wet Prep HPF POC FEW (*) NONE SEEN   Flagyl 2 GM PO in MAU  Ultrasound: CLINICAL DATA: Pain and vaginal bleeding.  EXAM:  OBSTETRIC <14 WK [place] AND TRANSVAGINAL OB US  TECHNIQUE:  Both transabdominal and transvaginal ultrasound examinations were  performed for complete evaluation of the gestation as well as the  maternal uterus, adnexal regions, and pelvic cul-de-sac.  Transvaginal technique was performed to assess early pregnancy.  COMPARISON: 11/14/2010  FINDINGS:  Intrauterine gestational sac: None  Yolk sac: N/A  Embryo: N/A  Cardiac Activity: N/A  Heart Rate: N/A bpm  Maternal uterus/adnexae:  Left adnexal mass with central fluid collection and what appears to  be a yolk sac. There is increased blood flow around this lesion and  this is consistent with an ectopic pregnancy. There is a probable  slightly complex corpus luteum cyst associated with the right ovary.  No free pelvic fluid collections.  IMPRESSION:  1. Ultrasound findings consistent with an ectopic pregnancy on the  left side.  2. No intrauterine gestational sac. Thickened endometrium.  3. Corpus luteum cyst right ovary.  These results were called by telephone at the time of interpretation  on 04/27/2013 at 2:13 PM to Dr.  Lindaann Slough , who verbally  acknowledged these results.  MTX in MAU  Assessment and Plan  28 yo G3P0020 at 5.2 wks IUP Ectopic Pregnancy Trichomoniasis  Plan: Discharge to home Return in 4 days for repeat BHCG GC/CT pending Ectopic warning signs given  [facility] 04/27/2013, 12:29 PM

## 2013-04-27 NOTE — Discharge Instructions (Signed)
Methotrexate Treatment for an Ectopic Pregnancy An ectopic pregnancy is when the fertilized egg attaches (implants) outside the uterus. Most ectopic pregnancies occur in the fallopian tube. Rarely do ectopic pregnancies occur on the ovary, intestine, pelvis, or cervix. An ectopic pregnancy does not have the ability to develop into a normal, healthy baby. Having an ectopic pregnancy can be a life-threatening experience. However, if the ectopic pregnancy is found early enough, it can be treated with a medicine. This medicine is called methotrexate. Methotrexate works by stopping the pregnancy from growing. It helps the body absorb the pregnancy tissue over a 2 to 6 week period (though most pregnancies will be absorbed by 3 weeks).  If methotrexate is successful, there is a good chance that the fallopian tube may be saved. Regardless of whether the fallopian tube is saved, a mother who has had an ectopic pregnancy is at a much higher risk of having another ectopic occur in future pregnancies. One serious concern is the potential for the fallopian tube to tear (rupture). If it does, emergency surgery is needed to remove the pregnancy, and methotrexate cannot be used. The ideal patient for methotrexate is a person who is:   Not bleeding internally.  Has no severe or persistent abdominal pain.  Is committed to following through with lab tests and appointments until the ectopic has absorbed.  Is healthy and has normal liver and kidney functions on evaluation. Methotrexate should not be given to women who:  Are breastfeeding.  Have a normal pregnancy (intrauterine pregnancy).  Have liver, lung, or kidney disease.  Have blood problems.  Are allergic to methotrexate.  Have peptic ulcers.  Have an ectopic pregnancy larger than 1 inches (3.5 cm) or one that has fetal heartbeats. This is a rule that is followed most of the time (relative contraindication). BEFORE THE TREATMENT Before giving the  medicine:  Liver tests, kidney tests, and a complete blood test are performed.  Blood tests are performed to measure the pregnancy hormone levels and to determine the mother's blood type.  If the woman is Rh negative, and the father is Rh positive or his Rh type is not known, a RhoGAM shot is given. TREATMENT  There are 2 methods that your caregiver may use to prescribe methotrexate. One method involves a single dose or injection of the medicine. Another method involves a series of doses. This method involves several injections.  AFTER THE TREATMENT Blood tests will be taken for several weeks to check the pregnancy hormone levels. The blood tests are performed until there is no more pregnancy hormone detected in the blood. There is still a risk of the ectopic pregnancy rupturing while using the methotrexate. There are also side effects of methotrexate, which include:   Nausea and vomiting.  Mouth sores.  Diarrhea.  Rash.  Dizziness.  Increased abdominal pain.  Increased vaginal bleeding or spotting.  Pneumonia.  Failed treatment.  Hair loss. This is rare and reversible. On very rare occasions, the medicine may affect your blood counts, liver, kidney, bone marrow, or hormone levels. If this happens, your caregiver will want to perform further evaluations. Document Released: 04/07/2001 Document Revised: 07/06/2011 Document Reviewed: 12/18/2010 [facility] Patient Information 2014 [place], [place].  Trichomoniasis Trichomoniasis is an infection, caused by the Trichomonas organism, that affects both women and men. In women, the outer female genitalia and the vagina are affected. In men, the penis is mainly affected, but the prostate and other reproductive organs can also be involved. Trichomoniasis is a sexually transmitted  disease (STD) and is most often passed to another person through sexual contact. The majority of people who get trichomoniasis do so from a sexual encounter and are  also at risk for other STDs. CAUSES   Sexual intercourse with an infected partner.  It can be present in swimming pools or hot tubs. SYMPTOMS   Abnormal gray-green frothy vaginal discharge in women.  Vaginal itching and irritation in women.  Itching and irritation of the area outside the vagina in women.  Penile discharge with or without pain in males.  Inflammation of the urethra (urethritis), causing painful urination.  Bleeding after sexual intercourse. RELATED COMPLICATIONS  Pelvic inflammatory disease.  Infection of the uterus (endometritis).  Infertility.  Tubal (ectopic) pregnancy.  It can be associated with other STDs, including gonorrhea and chlamydia, hepatitis B, and HIV. COMPLICATIONS DURING PREGNANCY  Early (premature) delivery.  Premature rupture of the membranes (PROM).  Low birth weight. DIAGNOSIS   Visualization of Trichomonas under the microscope from the vagina discharge.  Ph of the vagina greater than 4.5, tested with a test tape.  Trich Rapid Test.  Culture of the organism, but this is not usually needed.  It may be found on a Pap test.  Having a "strawberry cervix,"which means the cervix looks very red like a strawberry. TREATMENT   You may be given medication to fight the infection. Inform your caregiver if you could be or are pregnant. Some medications used to treat the infection should not be taken during pregnancy.  Over-the-counter medications or creams to decrease itching or irritation may be recommended.  Your sexual partner will need to be treated if infected. HOME CARE INSTRUCTIONS   Take all medication prescribed by your caregiver.  Take over-the-counter medication for itching or irritation as directed by your caregiver.  Do not have sexual intercourse while you have the infection.  Do not douche or wear tampons.  Discuss your infection with your partner, as your partner may have acquired the infection from you. Or,  your partner may have been the person who transmitted the infection to you.  Have your sex partner examined and treated if necessary.  Practice safe, informed, and protected sex.  See your caregiver for other STD testing. SEEK MEDICAL CARE IF:   You still have symptoms after you finish the medication.  You have an oral temperature above 102 F (38.9 C).  You develop belly (abdominal) pain.  You have pain when you urinate.  You have bleeding after sexual intercourse.  You develop a rash.  The medication makes you sick or makes you throw up (vomit). Document Released: 10/07/2000 Document Revised: 07/06/2011 Document Reviewed: 11/02/2008 [facility] Patient Information 2014 [place], [place].

## 2013-04-28 LAB — RH IG WORKUP (INCLUDES ABO/RH)
ABO/RH(D): O NEG
ANTIBODY SCREEN: NEGATIVE
Gestational Age(Wks): 5
UNIT DIVISION: 0

## 2013-04-30 ENCOUNTER — Telehealth: Payer: Self-pay | Admitting: Advanced Practice Midwife

## 2013-04-30 ENCOUNTER — Inpatient Hospital Stay (HOSPITAL_COMMUNITY)
Admission: AD | Admit: 2013-04-30 | Discharge: 2013-04-30 | Disposition: A | Discharge status: Home / Self Care | Payer: Self-pay | Source: Ambulatory Visit | Attending: Family Medicine | Admitting: Family Medicine

## 2013-04-30 DIAGNOSIS — O00109 Unspecified tubal pregnancy without intrauterine pregnancy: Secondary | ICD-10-CM | POA: Insufficient documentation

## 2013-04-30 DIAGNOSIS — O009 Unspecified ectopic pregnancy without intrauterine pregnancy: Secondary | ICD-10-CM

## 2013-04-30 DIAGNOSIS — R9389 Abnormal findings on diagnostic imaging of other specified body structures: Secondary | ICD-10-CM | POA: Insufficient documentation

## 2013-04-30 DIAGNOSIS — N831 Corpus luteum cyst of ovary, unspecified side: Secondary | ICD-10-CM | POA: Insufficient documentation

## 2013-04-30 LAB — HCG, QUANTITATIVE, PREGNANCY: HCG, BETA CHAIN, QUANT, S: 8020 m[IU]/mL — AB (ref <5)

## 2013-04-30 LAB — GC/CHLAMYDIA PROBE AMP
CT Probe RNA: NEGATIVE
GC Probe RNA: NEGATIVE

## 2013-04-30 NOTE — Telephone Encounter (Signed)
Day 4 S/P MTX. Left MAU prior to quant results. Quant rose from 4931 to 8020. No pain or bleeding. No intervention per Dr. Gala Romney. F.U day 7 as schedule. Strongly urged to stay for result.

## 2013-04-30 NOTE — MAU Provider Note (Signed)
History   Chief Complaint:  No chief complaint on file.   [name] is  28 y.o. [id number] Patient's last menstrual period was 03/21/2013.Marland Kitchen Patient is here for day 4 S/P MTX quant. She is [redacted]w[redacted]d weeks gestation  by LMP.  2.5x1.1 cm left ectopic seen on [place]. 04/27/2013 MTX.  Since her last visit, the patient is without new complaint.   The patient reports bleeding as  none now.  No abd pain.   General ROS:  negative  Her previous Quantitative HCG values are: 04/27/2013: 4931    Physical Exam   Last menstrual period 03/21/2013.  Focused Gynecological Exam: examination not indicated  Labs: Results for orders placed during the hospital encounter of 04/30/13 (from the past 24 hour(s))  HCG, QUANTITATIVE, PREGNANCY   Collection Time    04/30/13  2:10 PM      Result Value Range   hCG, Beta Chain, Quant, S 8020 (*) <5 mIU/mL    Ultrasound Studies:   US Ob Comp Less 14 Wks  04/27/2013   CLINICAL DATA:  Pain and vaginal bleeding.  EXAM: OBSTETRIC <14 WK [place] AND TRANSVAGINAL OB US  TECHNIQUE: Both transabdominal and transvaginal ultrasound examinations were performed for complete evaluation of the gestation as well as the maternal uterus, adnexal regions, and pelvic cul-de-sac. Transvaginal technique was performed to assess early pregnancy.  COMPARISON:  11/14/2010  FINDINGS: Intrauterine gestational sac: None  Yolk sac:  N/A  Embryo:  N/A  Cardiac Activity: N/A  Heart Rate:  N/A bpm  Maternal uterus/adnexae:  Left adnexal mass with central fluid collection and what appears to be a yolk sac. There is increased blood flow around this lesion and this is consistent with an ectopic pregnancy. There is a probable slightly complex corpus luteum cyst associated with the right ovary.  No free pelvic fluid collections.  IMPRESSION: 1. Ultrasound findings consistent with an ectopic pregnancy on the left side. 2. No intrauterine gestational sac.  Thickened endometrium. 3. Corpus luteum cyst right ovary. These  results were called by telephone at the time of interpretation on 04/27/2013 at 2:13 PM to Dr. Lindaann Slough , who verbally acknowledged these results.   Electronically Signed   By: Kalman Jewels M.D.   On: 04/27/2013 14:14   US Ob Transvaginal  04/27/2013   CLINICAL DATA:  Pain and vaginal bleeding.  EXAM: OBSTETRIC <14 WK [place] AND TRANSVAGINAL OB US  TECHNIQUE: Both transabdominal and transvaginal ultrasound examinations were performed for complete evaluation of the gestation as well as the maternal uterus, adnexal regions, and pelvic cul-de-sac. Transvaginal technique was performed to assess early pregnancy.  COMPARISON:  11/14/2010  FINDINGS: Intrauterine gestational sac: None  Yolk sac:  N/A  Embryo:  N/A  Cardiac Activity: N/A  Heart Rate:  N/A bpm  Maternal uterus/adnexae:  Left adnexal mass with central fluid collection and what appears to be a yolk sac. There is increased blood flow around this lesion and this is consistent with an ectopic pregnancy. There is a probable slightly complex corpus luteum cyst associated with the right ovary.  No free pelvic fluid collections.  IMPRESSION: 1. Ultrasound findings consistent with an ectopic pregnancy on the left side. 2. No intrauterine gestational sac.  Thickened endometrium. 3. Corpus luteum cyst right ovary. These results were called by telephone at the time of interpretation on 04/27/2013 at 2:13 PM to Dr. Lindaann Slough , who verbally acknowledged these results.   Electronically Signed   By: Kalman Jewels M.D.   On:  04/27/2013 14:14    Assessment:  [redacted]w[redacted]d weeks gestation  Day 4 S/P MTX. Rising quants, but stable.   Plan: The patient is instructed to follow up in in 3 days for F/U quant or sooner PRN if Sx worsen. Left MAU prior to quant results. Called pt. Quant rose from 4931 to 8020. No pain or bleeding. No intervention per Dr. Gala Romney. F.U day 7 as schedule. Strongly urged to stay for results.  [name], [name] 04/30/2013, 3:29 PM

## 2013-04-30 NOTE — MAU Note (Signed)
Pt not in lobby, RN left msg on pt's cell to return call.

## 2013-04-30 NOTE — Discharge Instructions (Signed)
Ectopic Pregnancy An ectopic pregnancy is when the fertilized egg attaches (implants) outside the uterus. Most ectopic pregnancies occur in the fallopian tube. Rarely do ectopic pregnancies occur on the ovary, intestine, pelvis, or cervix. An ectopic pregnancy does not have the ability to develop into a normal, healthy baby.  A ruptured ectopic pregnancy is one in which the fallopian tube gets torn or bursts and results in internal bleeding. Often there is intense abdominal pain, and sometimes, vaginal bleeding. Having an ectopic pregnancy can be a life-threatening experience. If left untreated, this dangerous condition can lead to a blood transfusion, abdominal surgery, or even death. CAUSES  Damage to the fallopian tubes is the suspected cause in most ectopic pregnancies.  RISK FACTORS Depending on your circumstances, the amount of risk of having an ectopic pregnancy will vary. There are 3 categories that may help you identify whether you are potentially at risk. High Risk  You have gone through infertility treatment.  You have had a previous ectopic pregnancy.  You have had previous tubal surgery.  You have had previous surgery to have the fallopian tubes tied (tubal ligation).  You have tubal problems or diseases.  You have been exposed to DES. DES is a medicine that was used until 1971 and had effects on babies whose mothers took the medicine.  You become pregnant while using an intrauterine device (IUD) for birth control. Moderate Risk  You have a history of infertility.  You have a history of a sexually transmitted infection (STI).  You have a history of pelvic inflammatory disease (PID).  You have scarring from endometriosis.  You have multiple sexual partners.  You smoke. Low Risk  You have had previous pelvic surgery.  You use vaginal douching.  You became sexually active before 29 years of age. SYMPTOMS  An ectopic pregnancy should be suspected in anyone who  has missed a period and has abdominal pain or bleeding.  You may experience normal pregnancy symptoms, such as:  Nausea.  Tiredness.  Breast tenderness.  Symptoms that are not normal include:  Pain with intercourse.  Irregular vaginal bleeding or spotting.  Cramping or pain on one side, or in the lower abdomen.  Fast heartbeat.  Passing out while having a bowel movement.  Symptoms of a ruptured ectopic pregnancy and internal bleeding may include:  Sudden, severe pain in the abdomen and pelvis.  Dizziness or fainting.  Pain in the shoulder area. DIAGNOSIS  Tests that may be performed include:  A pregnancy test.  An ultrasound.  Testing the specific level of pregnancy hormone in the bloodstream.  Taking a sample of uterus tissue (dilation and curettage, D&C).  Surgery to perform a visual exam of the inside of the abdomen using a lighted tube (laparoscopy). TREATMENT  An injection of methotrexate medicine may be given. This is given if:  The diagnosis is made early.  The fallopian tube has not ruptured.  You are considered to be a good candidate for the medicine. Usually, pregnancy hormone blood levels are checked after methotrexate treatment. This is to be sure the medicine is effective. It may take 4 to 6 weeks for the pregnancy to be absorbed (though most pregnancies will be absorbed by 3 weeks). Surgical treatment may be needed. A lighted tube (laparoscope) is used to remove the tubal pregnancy. If severe internal bleeding occurs, a cut (incision) may be made in the lower abdomen (laparotomy), and the ectopic pregnancy is removed. This stops the bleeding. Part of the fallopian tube, or  the whole tube, may be removed as well (salpingectomy). After surgery, pregnancy hormone tests may be done to be sure there is no pregnancy tissue left. You may receive a RhoGAM shot if you are Rh negative and the father is Rh positive, or if you do not know the Rh type of the father.  This is to prevent problems with any future pregnancy. SEEK IMMEDIATE MEDICAL CARE IF:  You have any symptoms of an ectopic pregnancy. This is a medical emergency. Document Released: 05/21/2004 Document Revised: 07/06/2011 Document Reviewed: 11/10/2012 [facility] Patient Information 2014 [place], [place].

## 2013-04-30 NOTE — MAU Provider Note (Signed)
Attestation of Attending Supervision of Advanced Practitioner (CNM/NP): Evaluation and management procedures were performed by the Advanced Practitioner under my supervision and collaboration. I have reviewed the Advanced Practitioner's note and chart, and I agree with the management and plan.  LEGGETT,KELLY H. 5:36 PM

## 2013-04-30 NOTE — MAU Note (Signed)
VSmith, CNM called for pt who was not in lobby.

## 2013-04-30 NOTE — MAU Note (Signed)
Pt not in lobby. RN attempted to contact pt via phone number provided, however number is not of service.Marland Kitchen

## 2013-05-03 ENCOUNTER — Inpatient Hospital Stay (HOSPITAL_COMMUNITY)
Admission: AD | Admit: 2013-05-03 | Discharge: 2013-05-03 | Disposition: A | Discharge status: Home / Self Care | Payer: Self-pay | Source: Ambulatory Visit | Attending: Student | Admitting: Student

## 2013-05-03 DIAGNOSIS — O00109 Unspecified tubal pregnancy without intrauterine pregnancy: Secondary | ICD-10-CM | POA: Insufficient documentation

## 2013-05-03 DIAGNOSIS — O009 Unspecified ectopic pregnancy without intrauterine pregnancy: Secondary | ICD-10-CM

## 2013-05-03 LAB — CBC WITH DIFFERENTIAL/PLATELET
BASOS PCT: 0 % (ref 0–1)
Basophils Absolute: 0 10*3/uL (ref 0.0–0.1)
Eosinophils Absolute: 0.1 10*3/uL (ref 0.0–0.7)
Eosinophils Relative: 1 % (ref 0–5)
HCT: 33.8 % — ABNORMAL LOW (ref 36.0–46.0)
HEMOGLOBIN: 11.6 g/dL — AB (ref 12.0–15.0)
LYMPHS ABS: 1.4 10*3/uL (ref 0.7–4.0)
Lymphocytes Relative: 24 % (ref 12–46)
MCH: 31.4 pg (ref 26.0–34.0)
MCHC: 34.3 g/dL (ref 30.0–36.0)
MCV: 91.6 fL (ref 78.0–100.0)
Monocytes Absolute: 0.3 10*3/uL (ref 0.1–1.0)
Monocytes Relative: 6 % (ref 3–12)
NEUTROS PCT: 69 % (ref 43–77)
Neutro Abs: 4 10*3/uL (ref 1.7–7.7)
PLATELETS: 249 10*3/uL (ref 150–400)
RBC: 3.69 MIL/uL — AB (ref 3.87–5.11)
RDW: 12.7 % (ref 11.5–15.5)
WBC: 5.8 10*3/uL (ref 4.0–10.5)

## 2013-05-03 LAB — COMPREHENSIVE METABOLIC PANEL
ALBUMIN: 3.2 g/dL — AB (ref 3.5–5.2)
ALK PHOS: 74 U/L (ref 39–117)
ALT: 10 U/L (ref 0–35)
AST: 17 U/L (ref 0–37)
BILIRUBIN TOTAL: 1.1 mg/dL (ref 0.3–1.2)
BUN: 7 mg/dL (ref 6–23)
CO2: 22 mEq/L (ref 19–32)
Calcium: 9.1 mg/dL (ref 8.4–10.5)
Chloride: 103 mEq/L (ref 96–112)
Creatinine, Ser: 0.72 mg/dL (ref 0.50–1.10)
GFR calc Af Amer: >90 mL/min (ref >90)
GFR calc non Af Amer: >90 mL/min (ref >90)
Glucose, Bld: 77 mg/dL (ref 70–99)
POTASSIUM: 3.9 meq/L (ref 3.7–5.3)
SODIUM: 139 meq/L (ref 137–147)
TOTAL PROTEIN: 7.3 g/dL (ref 6.0–8.3)

## 2013-05-03 LAB — HCG, QUANTITATIVE, PREGNANCY: HCG, BETA CHAIN, QUANT, S: 7066 m[IU]/mL — AB (ref <5)

## 2013-05-03 MED ORDER — METHOTREXATE INJECTION FOR WOMEN'S HOSPITAL
50.0000 mg/m2 | Freq: Once | INTRAMUSCULAR | Status: AC
  Administered 2013-05-03: 75 mg via INTRAMUSCULAR
  Filled 2013-05-03: qty 1.5

## 2013-05-03 NOTE — MAU Provider Note (Signed)
Chief Complaint: Follow-up     SUBJECTIVE  History   Chief Complaint: No chief complaint on file.  [name] is 28 y.o. [id number] Patient's last menstrual period was 03/21/2013.Marland Kitchen Patient is here for day 7 S/P MTX quant. She is [redacted]w[redacted]d weeks gestation by LMP. 2.5x1.1 cm left ectopic seen on [place]. 04/27/2013  MTX.  Since her last visit, the patient is without new complaint. The patient reports bleeding as none now. No abd pain.  General ROS: negative  Her previous Quantitative HCG values are:  04/27/2013: 4931  04/30/13:8020     Past Medical History  Diagnosis Date  . HIV (human immunodeficiency virus infection)   . Depression   . Anemia   . HSV (herpes simplex virus) infection   . History of shingles   . AIN III (anal intraepithelial neoplasia III)   . Condyloma acuminatum in female   . History of chronic bronchitis   . History of esophagitis     CANDIDA  . Periodontitis, chronic    OB History  Gravida Para Term Preterm AB SAB TAB Ectopic Multiple Living  3 0 0  2 1  1   0    # Outcome Date GA Lbr Len/2nd Weight Sex Delivery Anes PTL Lv  3 CUR           2 ECT 07/01/10 [redacted]w[redacted]d            Comments: left ectopic; methotrexate  1 SAB  [redacted]w[redacted]d            Comments: d and c     Past Surgical History  Procedure Laterality Date  . Dilation and curettage of uterus  2005    MISSED AB  . Examination under anesthesia N/A 09/23/2012    Procedure: EXAM UNDER ANESTHESIA;  Surgeon: Adin Hector, MD;  Location: [place];  Service: General;  Laterality: N/A;  . Laser ablation condolamata N/A 09/23/2012    Procedure: REMOVAL/ABLATION  ABLATION CONDOLAMATA WARTS;  Surgeon: Adin Hector, MD;  Location: [place];  Service: General;  Laterality: N/A;   History   Social History  . Marital Status: Single    Spouse Name: N/A    Number of Children: N/A  . Years of Education: N/A   Occupational History  . Not on file.   Social History Main Topics  .  Smoking status: Current Every Day Smoker -- 0.50 packs/day for 7 years    Types: Cigarettes  . Smokeless tobacco: Never Used  . Alcohol Use: No  . Drug Use: Yes    Special: Marijuana     Comment: 3 X/week  . Sexual Activity: Not Currently    Partners: Male     Comment: pt. declined condoms   Other Topics Concern  . Not on file   Social History Narrative  . No narrative on file   No current facility-administered medications on file prior to encounter.   Current Outpatient Prescriptions on File Prior to Encounter  Medication Sig Dispense Refill  . atazanavir (REYATAZ) 300 MG capsule Take 1 capsule (300 mg total) by mouth daily with breakfast. TAKE 1 CAPSULE BY MOUTH DAILY WITH BREAKFAST. TAKE WITH NORVIR  30 capsule  6  . emtricitabine-tenofovir (TRUVADA) 200-300 MG per tablet Take 1 tablet by mouth daily. TAKE 1 TABLET BY MOUTH DAILY  30 tablet  6  . Polyvinyl Alcohol-Povidone (CLEAR EYES ALL SEASONS) 5-6 MG/ML SOLN Apply 2 drops to eye as needed (dry eyes).      Marland Kitchen  ritonavir (NORVIR) 100 MG TABS tablet Take 1 tablet (100 mg total) by mouth daily with breakfast. TAKE 1 TABLET BY MOUTH DAILY . TAKE WITH REYATAZ  30 tablet  6   No Known Allergies  ROS: Pertinent items in HPI  OBJECTIVE Blood pressure 120/71, pulse 78, temperature 99.1 F (37.3 C), temperature source Oral, resp. rate 16, last menstrual period 03/21/2013, SpO2 100.00%. GENERAL: Well-developed, well-nourished female in no acute distress.  ABDOMEN: Soft, non-tenderLAB RESULTS Results for orders placed during the hospital encounter of 05/03/13 (from the past 24 hour(s))  HCG, QUANTITATIVE, PREGNANCY     Status: Abnormal   Collection Time    05/03/13  1:01 PM      Result Value Range   hCG, Beta Chain, Quant, S 7066 (*) <5 mIU/mL    IMAGING US Ob Comp Less 14 Wks  04/27/2013   CLINICAL DATA:  Pain and vaginal bleeding.  EXAM: OBSTETRIC <14 WK [place] AND TRANSVAGINAL OB US  TECHNIQUE: Both transabdominal and transvaginal  ultrasound examinations were performed for complete evaluation of the gestation as well as the maternal uterus, adnexal regions, and pelvic cul-de-sac. Transvaginal technique was performed to assess early pregnancy.  COMPARISON:  11/14/2010  FINDINGS: Intrauterine gestational sac: None  Yolk sac:  N/A  Embryo:  N/A  Cardiac Activity: N/A  Heart Rate:  N/A bpm  Maternal uterus/adnexae:  Left adnexal mass with central fluid collection and what appears to be a yolk sac. There is increased blood flow around this lesion and this is consistent with an ectopic pregnancy. There is a probable slightly complex corpus luteum cyst associated with the right ovary.  No free pelvic fluid collections.  IMPRESSION: 1. Ultrasound findings consistent with an ectopic pregnancy on the left side. 2. No intrauterine gestational sac.  Thickened endometrium. 3. Corpus luteum cyst right ovary. These results were called by telephone at the time of interpretation on 04/27/2013 at 2:13 PM to Dr. Lindaann Slough , who verbally acknowledged these results.   Electronically Signed   By: Kalman Jewels M.D.   On: 04/27/2013 14:14   US Ob Transvaginal  04/27/2013   CLINICAL DATA:  Pain and vaginal bleeding.  EXAM: OBSTETRIC <14 WK [place] AND TRANSVAGINAL OB US  TECHNIQUE: Both transabdominal and transvaginal ultrasound examinations were performed for complete evaluation of the gestation as well as the maternal uterus, adnexal regions, and pelvic cul-de-sac. Transvaginal technique was performed to assess early pregnancy.  COMPARISON:  11/14/2010  FINDINGS: Intrauterine gestational sac: None  Yolk sac:  N/A  Embryo:  N/A  Cardiac Activity: N/A  Heart Rate:  N/A bpm  Maternal uterus/adnexae:  Left adnexal mass with central fluid collection and what appears to be a yolk sac. There is increased blood flow around this lesion and this is consistent with an ectopic pregnancy. There is a probable slightly complex corpus luteum cyst associated with the right  ovary.  No free pelvic fluid collections.  IMPRESSION: 1. Ultrasound findings consistent with an ectopic pregnancy on the left side. 2. No intrauterine gestational sac.  Thickened endometrium. 3. Corpus luteum cyst right ovary. These results were called by telephone at the time of interpretation on 04/27/2013 at 2:13 PM to Dr. Lindaann Slough , who verbally acknowledged these results.   Electronically Signed   By: Kalman Jewels M.D.   On: 04/27/2013 14:14    MAU COURSE Counseled and offered a second dose of MTX since quant not declining appropriately> pt desires MTX CBC with diff and CMP done MTX #  2 given  ASSESSMENT 1. Ectopic pregnancy     PLAN Discharge home    Medication List         atazanavir 300 MG capsule  Commonly known as:  REYATAZ  Take 1 capsule (300 mg total) by mouth daily with breakfast. TAKE 1 CAPSULE BY MOUTH DAILY WITH BREAKFAST. TAKE WITH NORVIR     CLEAR EYES ALL SEASONS 5-6 MG/ML Soln  Generic drug:  Polyvinyl Alcohol-Povidone  Apply 2 drops to eye as needed (dry eyes).     emtricitabine-tenofovir 200-300 MG per tablet  Commonly known as:  TRUVADA  Take 1 tablet by mouth daily. TAKE 1 TABLET BY MOUTH DAILY     ritonavir 100 MG Tabs tablet  Commonly known as:  NORVIR  Take 1 tablet (100 mg total) by mouth daily with breakfast. TAKE 1 TABLET BY MOUTH DAILY . TAKE WITH REYATAZ       Follow-up Information   Follow up with Nursepractioner Mau, NP On 05/06/2013. (For repeat quant)    Contact information:   [phone]       Lorene Dy, CNM 05/03/2013  2:37 PM

## 2013-05-03 NOTE — MAU Provider Note (Signed)
Attestation of Attending Supervision of Advanced Practitioner (PA/CNM/NP): Evaluation and management procedures were performed by the Advanced Practitioner under my supervision and collaboration.  I have reviewed the Advanced Practitioner's note and chart, and I agree with the management and plan.  Tyrelle Raczka, MD, FACOG Attending Obstetrician & Gynecologist Faculty Practice, [facility]  

## 2013-05-03 NOTE — MAU Note (Signed)
Patient to MAU for day 7 s/p MTX BHCG. Patient denies pain or bleeding.

## 2013-05-03 NOTE — Discharge Instructions (Signed)
Ectopic Pregnancy An ectopic pregnancy is when the fertilized egg attaches (implants) outside the uterus. Most ectopic pregnancies occur in the fallopian tube. Rarely do ectopic pregnancies occur on the ovary, intestine, pelvis, or cervix. An ectopic pregnancy does not have the ability to develop into a normal, healthy baby.  A ruptured ectopic pregnancy is one in which the fallopian tube gets torn or bursts and results in internal bleeding. Often there is intense abdominal pain, and sometimes, vaginal bleeding. Having an ectopic pregnancy can be a life-threatening experience. If left untreated, this dangerous condition can lead to a blood transfusion, abdominal surgery, or even death. CAUSES  Damage to the fallopian tubes is the suspected cause in most ectopic pregnancies.  RISK FACTORS Depending on your circumstances, the amount of risk of having an ectopic pregnancy will vary. There are 3 categories that may help you identify whether you are potentially at risk. High Risk  You have gone through infertility treatment.  You have had a previous ectopic pregnancy.  You have had previous tubal surgery.  You have had previous surgery to have the fallopian tubes tied (tubal ligation).  You have tubal problems or diseases.  You have been exposed to DES. DES is a medicine that was used until 1971 and had effects on babies whose mothers took the medicine.  You become pregnant while using an intrauterine device (IUD) for birth control. Moderate Risk  You have a history of infertility.  You have a history of a sexually transmitted infection (STI).  You have a history of pelvic inflammatory disease (PID).  You have scarring from endometriosis.  You have multiple sexual partners.  You smoke. Low Risk  You have had previous pelvic surgery.  You use vaginal douching.  You became sexually active before 29 years of age. SYMPTOMS  An ectopic pregnancy should be suspected in anyone who  has missed a period and has abdominal pain or bleeding.  You may experience normal pregnancy symptoms, such as:  Nausea.  Tiredness.  Breast tenderness.  Symptoms that are not normal include:  Pain with intercourse.  Irregular vaginal bleeding or spotting.  Cramping or pain on one side, or in the lower abdomen.  Fast heartbeat.  Passing out while having a bowel movement.  Symptoms of a ruptured ectopic pregnancy and internal bleeding may include:  Sudden, severe pain in the abdomen and pelvis.  Dizziness or fainting.  Pain in the shoulder area. DIAGNOSIS  Tests that may be performed include:  A pregnancy test.  An ultrasound.  Testing the specific level of pregnancy hormone in the bloodstream.  Taking a sample of uterus tissue (dilation and curettage, D&C).  Surgery to perform a visual exam of the inside of the abdomen using a lighted tube (laparoscopy). TREATMENT  An injection of methotrexate medicine may be given. This is given if:  The diagnosis is made early.  The fallopian tube has not ruptured.  You are considered to be a good candidate for the medicine. Usually, pregnancy hormone blood levels are checked after methotrexate treatment. This is to be sure the medicine is effective. It may take 4 to 6 weeks for the pregnancy to be absorbed (though most pregnancies will be absorbed by 3 weeks). Surgical treatment may be needed. A lighted tube (laparoscope) is used to remove the tubal pregnancy. If severe internal bleeding occurs, a cut (incision) may be made in the lower abdomen (laparotomy), and the ectopic pregnancy is removed. This stops the bleeding. Part of the fallopian tube, or  the whole tube, may be removed as well (salpingectomy). After surgery, pregnancy hormone tests may be done to be sure there is no pregnancy tissue left. You may receive a RhoGAM shot if you are Rh negative and the father is Rh positive, or if you do not know the Rh type of the father.  This is to prevent problems with any future pregnancy. °SEEK IMMEDIATE MEDICAL CARE IF:  °You have any symptoms of an ectopic pregnancy. This is a medical emergency. °Document Released: 05/21/2004 Document Revised: 07/06/2011 Document Reviewed: 11/10/2012 °ExitCare® Patient Information ©2014 [employer]. ° °

## 2013-05-06 ENCOUNTER — Inpatient Hospital Stay (HOSPITAL_COMMUNITY)
Admission: AD | Admit: 2013-05-06 | Discharge: 2013-05-06 | Disposition: A | Discharge status: Home / Self Care | Payer: Self-pay | Source: Ambulatory Visit | Attending: Obstetrics & Gynecology | Admitting: Obstetrics & Gynecology

## 2013-05-06 ENCOUNTER — Encounter (HOSPITAL_COMMUNITY): Payer: Self-pay

## 2013-05-06 DIAGNOSIS — F17200 Nicotine dependence, unspecified, uncomplicated: Secondary | ICD-10-CM | POA: Insufficient documentation

## 2013-05-06 DIAGNOSIS — O00109 Unspecified tubal pregnancy without intrauterine pregnancy: Secondary | ICD-10-CM | POA: Insufficient documentation

## 2013-05-06 DIAGNOSIS — O009 Unspecified ectopic pregnancy without intrauterine pregnancy: Secondary | ICD-10-CM

## 2013-05-06 LAB — HCG, QUANTITATIVE, PREGNANCY: HCG, BETA CHAIN, QUANT, S: 5321 m[IU]/mL — AB (ref <5)

## 2013-05-06 NOTE — MAU Note (Signed)
Pt states here for repeat hormone level. Denies pain or bleeding.

## 2013-05-06 NOTE — MAU Provider Note (Signed)
Attestation of Attending Supervision of Advanced Practitioner (CNM/NP): Evaluation and management procedures were performed by the Advanced Practitioner under my supervision and collaboration.  I have reviewed the Advanced Practitioner's note and chart, and I agree with the management and plan.  HARRAWAY-Lowery, Rex Oesterle 1:57 PM     

## 2013-05-06 NOTE — MAU Provider Note (Signed)
History     CSN: [id number]  Arrival date and time: 05/06/13 2440   First Provider Initiated Contact with Patient 05/06/13 1034      No chief complaint on file.  HPI [name] 28 y.o. Client is here for BHCG level.  This is Day 4 after her second dose of MTX for an ectopic pregnancy.  OB History   Grav Para Term Preterm Abortions TAB SAB Ect Mult Living   3 0 0  2  1 1   0      Past Medical History  Diagnosis Date  . HIV (human immunodeficiency virus infection)   . Depression   . Anemia   . HSV (herpes simplex virus) infection   . History of shingles   . AIN III (anal intraepithelial neoplasia III)   . Condyloma acuminatum in female   . History of chronic bronchitis   . History of esophagitis     CANDIDA  . Periodontitis, chronic     Past Surgical History  Procedure Laterality Date  . Dilation and curettage of uterus  2005    MISSED AB  . Examination under anesthesia N/A 09/23/2012    Procedure: EXAM UNDER ANESTHESIA;  Surgeon: Adin Hector, MD;  Location: [place];  Service: General;  Laterality: N/A;  . Laser ablation condolamata N/A 09/23/2012    Procedure: REMOVAL/ABLATION  ABLATION CONDOLAMATA WARTS;  Surgeon: Adin Hector, MD;  Location: [place];  Service: General;  Laterality: N/A;    History reviewed. No pertinent family history.  History  Substance Use Topics  . Smoking status: Current Every Day Smoker -- 0.50 packs/day for 7 years    Types: Cigarettes  . Smokeless tobacco: Never Used  . Alcohol Use: No    Allergies: No Known Allergies  Prescriptions prior to admission  Medication Sig Dispense Refill  . atazanavir (REYATAZ) 300 MG capsule Take 1 capsule (300 mg total) by mouth daily with breakfast. TAKE 1 CAPSULE BY MOUTH DAILY WITH BREAKFAST. TAKE WITH NORVIR  30 capsule  6  . emtricitabine-tenofovir (TRUVADA) 200-300 MG per tablet Take 1 tablet by mouth daily. TAKE 1 TABLET BY MOUTH DAILY  30 tablet   6  . Polyvinyl Alcohol-Povidone (CLEAR EYES ALL SEASONS) 5-6 MG/ML SOLN Apply 2 drops to eye as needed (dry eyes).      . ritonavir (NORVIR) 100 MG TABS tablet Take 1 tablet (100 mg total) by mouth daily with breakfast. TAKE 1 TABLET BY MOUTH DAILY . TAKE WITH REYATAZ  30 tablet  6    Review of Systems  Constitutional: Negative for fever.  Gastrointestinal: Negative for nausea, vomiting and abdominal pain.  Genitourinary:       No vaginal bleeding.   Physical Exam   Blood pressure 110/57, pulse 72, temperature 98.7 F (37.1 C), temperature source Oral, resp. rate 18, height 5\' 3"  (1.6 m), weight 103 lb 6 oz (46.891 kg), last menstrual period 03/21/2013.  Physical Exam  Nursing note and vitals reviewed. Constitutional: She is oriented to person, place, and time. She appears well-developed and well-nourished. No distress.  HENT:  Head: Normocephalic.  Eyes: EOM are normal.  Neck: Neck supple.  Respiratory: Effort normal.  Musculoskeletal: Normal range of motion.  Neurological: She is alert and oriented to person, place, and time.  Skin: Skin is warm and dry.  Psychiatric: She has a normal mood and affect.    MAU Course  Procedures Results for MARLOWE, CINQUEMANI (MRN [id number]) as of  05/06/2013 11:49  Ref. Range 04/27/2013 12:53 04/27/2013 14:05 04/30/2013 14:10 05/03/2013 13:01 05/06/2013 10:09  hCG, Beta Chain, Quant, S Latest Range: <5 mIU/mL   8020 (H) 7066 (H) 5321 (H)   MDM Confirmed with client the plan to return on Tuesday.    Assessment and Plan  Falling quants after second dose of MTX  Plan Client to return on Tuesday 05-09-13 for repeat quant day 7 To return sooner with severe abdominal pain or bleeding.  Paola Flynt 05/06/2013, 10:34 AM

## 2013-05-06 NOTE — MAU Note (Signed)
Pt seen by TBurleson, NP. Pt has two children with her. Denies pain or bleeding. Okay to be called with lab results. Precautions reviewed by RN with pt who verbalizes understanding.

## 2013-06-15 ENCOUNTER — Ambulatory Visit: Payer: Self-pay | Admitting: Internal Medicine

## 2013-06-30 ENCOUNTER — Emergency Department (INDEPENDENT_AMBULATORY_CARE_PROVIDER_SITE_OTHER)
Admission: EM | Admit: 2013-06-30 | Discharge: 2013-06-30 | Disposition: A | Discharge status: Home / Self Care | Payer: Self-pay | Source: Home / Self Care | Attending: Family Medicine | Admitting: Family Medicine

## 2013-06-30 ENCOUNTER — Encounter (HOSPITAL_COMMUNITY): Payer: Self-pay | Admitting: Emergency Medicine

## 2013-06-30 DIAGNOSIS — K2970 Gastritis, unspecified, without bleeding: Secondary | ICD-10-CM

## 2013-06-30 DIAGNOSIS — K2990 Gastroduodenitis, unspecified, without bleeding: Secondary | ICD-10-CM

## 2013-06-30 LAB — COMPREHENSIVE METABOLIC PANEL
ALBUMIN: 3.2 g/dL — AB (ref 3.5–5.2)
ALT: 11 U/L (ref 0–35)
AST: 15 U/L (ref 0–37)
Alkaline Phosphatase: 98 U/L (ref 39–117)
BILIRUBIN TOTAL: 0.6 mg/dL (ref 0.3–1.2)
BUN: 10 mg/dL (ref 6–23)
CHLORIDE: 98 meq/L (ref 96–112)
CO2: 26 mEq/L (ref 19–32)
Calcium: 9.7 mg/dL (ref 8.4–10.5)
Creatinine, Ser: 0.78 mg/dL (ref 0.50–1.10)
GFR calc non Af Amer: >90 mL/min (ref >90)
GLUCOSE: 72 mg/dL (ref 70–99)
Potassium: 4 mEq/L (ref 3.7–5.3)
SODIUM: 137 meq/L (ref 137–147)
Total Protein: 8.1 g/dL (ref 6.0–8.3)

## 2013-06-30 LAB — POCT URINALYSIS DIP (DEVICE)
Bilirubin Urine: NEGATIVE
Glucose, UA: NEGATIVE mg/dL
HGB URINE DIPSTICK: NEGATIVE
KETONES UR: NEGATIVE mg/dL
Leukocytes, UA: NEGATIVE
Nitrite: NEGATIVE
PH: 6 (ref 5.0–8.0)
Protein, ur: NEGATIVE mg/dL
SPECIFIC GRAVITY, URINE: 1.025 (ref 1.005–1.030)
Urobilinogen, UA: 0.2 mg/dL (ref 0.0–1.0)

## 2013-06-30 LAB — LIPASE, BLOOD: LIPASE: 13 U/L (ref 11–59)

## 2013-06-30 LAB — CBC
HEMATOCRIT: 33.3 % — AB (ref 36.0–46.0)
Hemoglobin: 11.7 g/dL — ABNORMAL LOW (ref 12.0–15.0)
MCH: 31.4 pg (ref 26.0–34.0)
MCHC: 35.1 g/dL (ref 30.0–36.0)
MCV: 89.3 fL (ref 78.0–100.0)
Platelets: 253 10*3/uL (ref 150–400)
RBC: 3.73 MIL/uL — AB (ref 3.87–5.11)
RDW: 14 % (ref 11.5–15.5)
WBC: 5.1 10*3/uL (ref 4.0–10.5)

## 2013-06-30 LAB — HCG, QUANTITATIVE, PREGNANCY: hCG, Beta Chain, Quant, S: 48 m[IU]/mL — ABNORMAL HIGH (ref <5)

## 2013-06-30 LAB — POCT PREGNANCY, URINE: Preg Test, Ur: POSITIVE — AB

## 2013-06-30 LAB — HCG, SERUM, QUALITATIVE: Preg, Serum: POSITIVE — AB

## 2013-06-30 MED ORDER — GI COCKTAIL ~~LOC~~
30.0000 mL | Freq: Once | ORAL | Status: AC
  Administered 2013-06-30: 30 mL via ORAL

## 2013-06-30 MED ORDER — OMEPRAZOLE 40 MG PO CPDR: 40.0000 mg | DELAYED_RELEASE_CAPSULE | Freq: Every day | ORAL | Status: DC

## 2013-06-30 MED ORDER — GI COCKTAIL ~~LOC~~
ORAL | Status: AC
  Filled 2013-06-30: qty 30

## 2013-06-30 NOTE — ED Notes (Signed)
C/o abdominal pain.  States recent tubal pregnancy in January and treated for trich.  Denies any abnormal discharge.    Having n/v/d.  And very fatigue.  Hx HIV positive.  Taking meds as prescribed.   Symptoms present x 1 wk.

## 2013-06-30 NOTE — ED Provider Notes (Signed)
[name] is a 28 y.o. female who presents to Urgent Care today for abdominal pain. Patient has had epigastric abdominal pain for several months. The pain worsend about a week ago. She notes that food helps alleviate her symptoms. She denies any vaginal discharge fevers or chills. Her last missed period was approximately 2 days ago. Her medical history significant for recent tubal pregnancy treated with 2 doses of methotrexate with last serum hCG at around 5000 January 10th.  She has had a total of 3 ectopic pregnancies at this point.  Additionally her medical history significant for HIV status with her last CD4 count at 34 in November of 2014. She notes occasional nausea vomiting diarrhea. She denies any fevers blood in her stool melanotic stool or coffee-ground emesis.  Past Medical History  Diagnosis Date  . HIV (human immunodeficiency virus infection)   . Depression   . Anemia   . HSV (herpes simplex virus) infection   . History of shingles   . AIN III (anal intraepithelial neoplasia III)   . Condyloma acuminatum in female   . History of chronic bronchitis   . History of esophagitis     CANDIDA  . Periodontitis, chronic    History  Substance Use Topics  . Smoking status: Current Every Day Smoker -- 0.50 packs/day for 7 years    Types: Cigarettes  . Smokeless tobacco: Never Used  . Alcohol Use: No   ROS as above Medications: No current facility-administered medications for this encounter.   Current Outpatient Prescriptions  Medication Sig Dispense Refill  . atazanavir (REYATAZ) 300 MG capsule Take 1 capsule (300 mg total) by mouth daily with breakfast. TAKE 1 CAPSULE BY MOUTH DAILY WITH BREAKFAST. TAKE WITH NORVIR  30 capsule  6  . emtricitabine-tenofovir (TRUVADA) 200-300 MG per tablet Take 1 tablet by mouth daily. TAKE 1 TABLET BY MOUTH DAILY  30 tablet  6  . ritonavir (NORVIR) 100 MG TABS tablet Take 1 tablet (100 mg total) by mouth daily with breakfast. TAKE 1 TABLET BY  MOUTH DAILY . TAKE WITH REYATAZ  30 tablet  6  . omeprazole (PRILOSEC) 40 MG capsule Take 1 capsule (40 mg total) by mouth daily.  30 capsule  1  . Polyvinyl Alcohol-Povidone (CLEAR EYES ALL SEASONS) 5-6 MG/ML SOLN Apply 2 drops to eye as needed (dry eyes).        Exam:  BP 126/82  Pulse 75  Temp(Src) 98.9 F (37.2 C) (Oral)  Resp 20  SpO2 100%  LMP 06/23/2013  Breastfeeding? No Gen: Well NAD HEENT: EOMI,  MMM Lungs: Normal work of breathing. CTABL Heart: RRR no MRG Abd: NABS, Soft. NT, ND no rebound or guarding Exts: Brisk capillary refill, warm and well perfused.   Patient was given a GI cocktail and felt much better  Lab Results  Component Value Date   CD4TCELL 36 03/21/2013    Results for orders placed during the hospital encounter of 06/30/13 (from the past 24 hour(s))  POCT URINALYSIS DIP (DEVICE)     Status: None   Collection Time    06/30/13  2:56 PM      Result Value Ref Range   Glucose, UA NEGATIVE  NEGATIVE mg/dL   Bilirubin Urine NEGATIVE  NEGATIVE   Ketones, ur NEGATIVE  NEGATIVE mg/dL   Specific Gravity, Urine 1.025  1.005 - 1.030   Hgb urine dipstick NEGATIVE  NEGATIVE   pH 6.0  5.0 - 8.0   Protein, ur NEGATIVE  NEGATIVE mg/dL  Urobilinogen, UA 0.2  0.0 - 1.0 mg/dL   Nitrite NEGATIVE  NEGATIVE   Leukocytes, UA NEGATIVE  NEGATIVE  POCT PREGNANCY, URINE     Status: Abnormal   Collection Time    06/30/13  3:06 PM      Result Value Ref Range   Preg Test, Ur POSITIVE (*) NEGATIVE  HCG, SERUM, QUALITATIVE     Status: Abnormal   Collection Time    06/30/13  4:01 PM      Result Value Ref Range   Preg, Serum POSITIVE (*) NEGATIVE  CBC     Status: Abnormal   Collection Time    06/30/13  4:01 PM      Result Value Ref Range   WBC 5.1  4.0 - 10.5 K/uL   RBC 3.73 (*) 3.87 - 5.11 MIL/uL   Hemoglobin 11.7 (*) 12.0 - 15.0 g/dL   HCT 33.3 (*) 36.0 - 46.0 %   MCV 89.3  78.0 - 100.0 fL   MCH 31.4  26.0 - 34.0 pg   MCHC 35.1  30.0 - 36.0 g/dL   RDW 14.0   11.5 - 15.5 %   Platelets 253  150 - 400 K/uL  COMPREHENSIVE METABOLIC PANEL     Status: Abnormal   Collection Time    06/30/13  4:01 PM      Result Value Ref Range   Sodium 137  137 - 147 mEq/L   Potassium 4.0  3.7 - 5.3 mEq/L   Chloride 98  96 - 112 mEq/L   CO2 26  19 - 32 mEq/L   Glucose, Bld 72  70 - 99 mg/dL   BUN 10  6 - 23 mg/dL   Creatinine, Ser 0.78  0.50 - 1.10 mg/dL   Calcium 9.7  8.4 - 10.5 mg/dL   Total Protein 8.1  6.0 - 8.3 g/dL   Albumin 3.2 (*) 3.5 - 5.2 g/dL   AST 15  0 - 37 U/L   ALT 11  0 - 35 U/L   Alkaline Phosphatase 98  39 - 117 U/L   Total Bilirubin 0.6  0.3 - 1.2 mg/dL   GFR calc non Af Amer >90  >90 mL/min   GFR calc Af Amer >90  >90 mL/min  LIPASE, BLOOD     Status: None   Collection Time    06/30/13  4:01 PM      Result Value Ref Range   Lipase 13  11 - 59 U/L  HCG, QUANTITATIVE, PREGNANCY     Status: Abnormal   Collection Time    06/30/13  4:01 PM      Result Value Ref Range   hCG, Beta Chain, Quant, S 48 (*) <5 mIU/mL   No results found.  Assessment and Plan: 28 y.o. female with abdominal pain. Her most likely solution for abdominal pain his gastritis as her symptoms improve with GI cocktail. Obvious concerning factors were for ectopic pregnancy as she has a history of multiple ectopic pregnancies and still has a positive urine pregnancy test despite treatment with methotrexate 2 months ago. Fortunately her beta hCG is significantly decreased from 5000 in January 10 to 48 today. I do not think this is the factor today.    additionally her HIV is obviously a concern. Will treat with omeprazole and followup with ID doctor ASAP.  Discussed warning signs or symptoms. Please see discharge instructions. Patient expresses understanding.    Gregor Hams, MD 06/30/13 [phone]

## 2013-06-30 NOTE — Discharge Instructions (Signed)
Thank you for coming in today. Followup with your doctor. Soon Take omeprazole daily. Avoid alcohol If your belly pain worsens, or you have high fever, bad vomiting, blood in your stool or black tarry stool go to the Emergency Room.   Gastritis, Adult Gastritis is soreness and swelling (inflammation) of the lining of the stomach. Gastritis can develop as a sudden onset (acute) or long-term (chronic) condition. If gastritis is not treated, it can lead to stomach bleeding and ulcers. CAUSES  Gastritis occurs when the stomach lining is weak or damaged. Digestive juices from the stomach then inflame the weakened stomach lining. The stomach lining may be weak or damaged due to viral or bacterial infections. One common bacterial infection is the Helicobacter pylori infection. Gastritis can also result from excessive alcohol consumption, taking certain medicines, or having too much acid in the stomach.  SYMPTOMS  In some cases, there are no symptoms. When symptoms are present, they may include:  Pain or a burning sensation in the upper abdomen.  Nausea.  Vomiting.  An uncomfortable feeling of fullness after eating. DIAGNOSIS  Your caregiver may suspect you have gastritis based on your symptoms and a physical exam. To determine the cause of your gastritis, your caregiver may perform the following:  Blood or stool tests to check for the H pylori bacterium.  Gastroscopy. A thin, flexible tube (endoscope) is passed down the esophagus and into the stomach. The endoscope has a light and camera on the end. Your caregiver uses the endoscope to view the inside of the stomach.  Taking a tissue sample (biopsy) from the stomach to examine under a microscope. TREATMENT  Depending on the cause of your gastritis, medicines may be prescribed. If you have a bacterial infection, such as an H pylori infection, antibiotics may be given. If your gastritis is caused by too much acid in the stomach, H2 blockers or  antacids may be given. Your caregiver may recommend that you stop taking aspirin, ibuprofen, or other nonsteroidal anti-inflammatory drugs (NSAIDs). HOME CARE INSTRUCTIONS  Only take over-the-counter or prescription medicines as directed by your caregiver.  If you were given antibiotic medicines, take them as directed. Finish them even if you start to feel better.  Drink enough fluids to keep your urine clear or pale yellow.  Avoid foods and drinks that make your symptoms worse, such as:  Caffeine or alcoholic drinks.  Chocolate.  Peppermint or mint flavorings.  Garlic and onions.  Spicy foods.  Citrus fruits, such as oranges, lemons, or limes.  Tomato-based foods such as sauce, chili, salsa, and pizza.  Fried and fatty foods.  Eat small, frequent meals instead of large meals. SEEK IMMEDIATE MEDICAL CARE IF:   You have black or dark red stools.  You vomit blood or material that looks like coffee grounds.  You are unable to keep fluids down.  Your abdominal pain gets worse.  You have a fever.  You do not feel better after 1 week.  You have any other questions or concerns. MAKE SURE YOU:  Understand these instructions.  Will watch your condition.  Will get help right away if you are not doing well or get worse. Document Released: 04/07/2001 Document Revised: 10/13/2011 Document Reviewed: 05/27/2011 [facility] Patient Information 2014 [place].

## 2013-07-06 ENCOUNTER — Encounter: Payer: Self-pay | Admitting: *Deleted

## 2013-07-10 ENCOUNTER — Other Ambulatory Visit: Payer: Self-pay

## 2013-08-16 ENCOUNTER — Telehealth: Payer: Self-pay | Admitting: *Deleted

## 2013-08-16 ENCOUNTER — Ambulatory Visit: Payer: Self-pay

## 2013-08-16 ENCOUNTER — Ambulatory Visit: Payer: Self-pay | Admitting: Internal Medicine

## 2013-08-16 NOTE — Telephone Encounter (Signed)
Non working phone number

## 2013-08-21 ENCOUNTER — Other Ambulatory Visit: Payer: Self-pay | Admitting: *Deleted

## 2013-08-21 ENCOUNTER — Encounter: Payer: Self-pay | Admitting: Internal Medicine

## 2013-08-21 ENCOUNTER — Ambulatory Visit (INDEPENDENT_AMBULATORY_CARE_PROVIDER_SITE_OTHER): Payer: Self-pay | Admitting: Internal Medicine

## 2013-08-21 VITALS — BP 108/69 | HR 69 | Temp 98.1°F | Wt 101.0 lb

## 2013-08-21 DIAGNOSIS — B20 Human immunodeficiency virus [HIV] disease: Secondary | ICD-10-CM

## 2013-08-21 DIAGNOSIS — L738 Other specified follicular disorders: Secondary | ICD-10-CM

## 2013-08-21 DIAGNOSIS — L739 Follicular disorder, unspecified: Secondary | ICD-10-CM | POA: Insufficient documentation

## 2013-08-21 MED ORDER — CEPHALEXIN 500 MG PO CAPS: 500.0000 mg | ORAL_CAPSULE | Freq: Four times a day (QID) | ORAL | Status: DC

## 2013-08-21 MED ORDER — RITONAVIR 100 MG PO TABS: 100.0000 mg | ORAL_TABLET | Freq: Every day | ORAL | Status: DC

## 2013-08-21 MED ORDER — EMTRICITABINE-TENOFOVIR DF 200-300 MG PO TABS: 1.0000 | ORAL_TABLET | Freq: Every day | ORAL | Status: DC

## 2013-08-21 MED ORDER — ATAZANAVIR SULFATE 300 MG PO CAPS: 300.0000 mg | ORAL_CAPSULE | Freq: Every day | ORAL | Status: DC

## 2013-08-21 NOTE — Telephone Encounter (Signed)
needs rx sent to [facility].  Too expensive at [facility]

## 2013-08-21 NOTE — Progress Notes (Signed)
  Subjective:    Patient ID: [name], female    DOB: April 24, 1986, 28 y.o.   MRN: [id number]  HPI  Here for follow up of HIV.  Unfortunately let ADAP expire and on her last day of meds.  Reports continued good compliance otherwise.  Weight stable..  No labs prior to this appt.  On Atazanavir, norvir and Truvada.  New cold sore on right corner of her mouth.  Also complaint of small pinpoint boils after shaving pubic hair.     Review of Systems  Constitutional: Negative for fever and fatigue.  HENT: Negative for sore throat and trouble swallowing.   Eyes: Negative for visual disturbance.  Respiratory: Negative for cough and shortness of breath.   Cardiovascular: Negative for leg swelling.  Gastrointestinal: Negative for nausea, abdominal pain and diarrhea.  Genitourinary: Negative for genital sores.  Musculoskeletal: Negative for arthralgias and myalgias.  Skin: Negative for rash.  Neurological: Negative for dizziness and headaches.  Hematological: Negative for adenopathy.  Psychiatric/Behavioral: Negative for dysphoric mood.       Objective:   Physical Exam  Constitutional: She appears well-developed and well-nourished. No distress.  HENT:  Mouth/Throat: No oropharyngeal exudate.  Eyes: Right eye exhibits no discharge. Left eye exhibits no discharge. No scleral icterus.  Cardiovascular: Normal rate, regular rhythm and normal heart sounds.   No murmur heard. Pulmonary/Chest: Breath sounds normal. No respiratory distress. She has no wheezes.  Lymphadenopathy:    She has no cervical adenopathy.  Neurological: She is alert.  Skin:  Cold sore          Assessment & Plan:

## 2013-08-21 NOTE — Progress Notes (Signed)
ADAP application 

## 2013-08-21 NOTE — Assessment & Plan Note (Signed)
Topical antibiotic and keflex

## 2013-08-21 NOTE — Assessment & Plan Note (Signed)
Counseled on importance of keeping up on ADAP.  REminded her on resistance.   Labs in 4 weeks and with me in 5 weeks.

## 2013-09-07 ENCOUNTER — Ambulatory Visit: Payer: Self-pay | Admitting: Internal Medicine

## 2013-10-02 ENCOUNTER — Other Ambulatory Visit: Payer: Self-pay

## 2013-10-11 ENCOUNTER — Ambulatory Visit: Payer: Self-pay | Admitting: Internal Medicine

## 2013-10-11 ENCOUNTER — Telehealth: Payer: Self-pay | Admitting: *Deleted

## 2013-10-11 NOTE — Telephone Encounter (Signed)
Unable to reach patient to inform of her missed visit.  Patient's phone is not in service. Patient let her ADAP coverage lapse, has no-showed 4 visits this year.  Will refer to [facility] Landis Gandy, RN

## 2013-10-16 ENCOUNTER — Ambulatory Visit (INDEPENDENT_AMBULATORY_CARE_PROVIDER_SITE_OTHER): Payer: Self-pay | Admitting: Internal Medicine

## 2013-10-16 ENCOUNTER — Encounter: Payer: Self-pay | Admitting: Internal Medicine

## 2013-10-16 VITALS — BP 138/87 | HR 61 | Temp 98.1°F | Ht 64.0 in | Wt 99.0 lb

## 2013-10-16 DIAGNOSIS — IMO0001 Reserved for inherently not codable concepts without codable children: Secondary | ICD-10-CM | POA: Insufficient documentation

## 2013-10-16 DIAGNOSIS — B20 Human immunodeficiency virus [HIV] disease: Secondary | ICD-10-CM

## 2013-10-16 DIAGNOSIS — J309 Allergic rhinitis, unspecified: Secondary | ICD-10-CM

## 2013-10-16 DIAGNOSIS — J302 Other seasonal allergic rhinitis: Secondary | ICD-10-CM

## 2013-10-16 LAB — CBC WITH DIFFERENTIAL/PLATELET
BASOS ABS: 0 10*3/uL (ref 0.0–0.1)
BASOS PCT: 0 % (ref 0–1)
Eosinophils Absolute: 0.1 10*3/uL (ref 0.0–0.7)
Eosinophils Relative: 1 % (ref 0–5)
HEMATOCRIT: 31.5 % — AB (ref 36.0–46.0)
Hemoglobin: 10.9 g/dL — ABNORMAL LOW (ref 12.0–15.0)
LYMPHS PCT: 17 % (ref 12–46)
Lymphs Abs: 1.1 10*3/uL (ref 0.7–4.0)
MCH: 28.5 pg (ref 26.0–34.0)
MCHC: 34.6 g/dL (ref 30.0–36.0)
MCV: 82.2 fL (ref 78.0–100.0)
MONO ABS: 0.4 10*3/uL (ref 0.1–1.0)
Monocytes Relative: 7 % (ref 3–12)
NEUTROS ABS: 4.8 10*3/uL (ref 1.7–7.7)
NEUTROS PCT: 75 % (ref 43–77)
PLATELETS: 292 10*3/uL (ref 150–400)
RBC: 3.83 MIL/uL — ABNORMAL LOW (ref 3.87–5.11)
RDW: 19.6 % — AB (ref 11.5–15.5)
WBC: 6.4 10*3/uL (ref 4.0–10.5)

## 2013-10-16 LAB — CK: Total CK: 59 U/L (ref 7–177)

## 2013-10-16 LAB — COMPLETE METABOLIC PANEL WITH GFR
ALBUMIN: 3.3 g/dL — AB (ref 3.5–5.2)
ALK PHOS: 96 U/L (ref 39–117)
ALT: 20 U/L (ref 0–35)
AST: 16 U/L (ref 0–37)
BUN: 9 mg/dL (ref 6–23)
CO2: 25 mEq/L (ref 19–32)
Calcium: 8.7 mg/dL (ref 8.4–10.5)
Chloride: 101 mEq/L (ref 96–112)
Creat: 0.72 mg/dL (ref 0.50–1.10)
GLUCOSE: 81 mg/dL (ref 70–99)
POTASSIUM: 3.2 meq/L — AB (ref 3.5–5.3)
Sodium: 135 mEq/L (ref 135–145)
TOTAL PROTEIN: 8.2 g/dL (ref 6.0–8.3)
Total Bilirubin: 0.8 mg/dL (ref 0.2–1.2)

## 2013-10-16 NOTE — Assessment & Plan Note (Signed)
Benadryl at night

## 2013-10-16 NOTE — Assessment & Plan Note (Signed)
Will check CK, otherwise supportive care with ibuprofen.

## 2013-10-16 NOTE — Progress Notes (Signed)
   Subjective:    Patient ID: ANJULI GEMMILL, female    DOB: 09/19/1985, 28 y.o.   MRN: [id number]  HPI She is here for a work in visit.  Back on her Reyataz, norvir and truvada.  Recently noticed a rash on both legs, fine erythema.  Has been out in the sun.  Also with myalgias of both legs.  Has had a cough for 3 days.  Runny nose, wakes up congested.  Has noted sputum with cough.  Also with itchy watery eyes.    Review of Systems  Constitutional: Negative for fever, chills and fatigue.  Respiratory: Positive for cough. Negative for shortness of breath and wheezing.   Gastrointestinal: Negative for diarrhea.  Musculoskeletal: Positive for myalgias. Negative for joint swelling.  Skin: Positive for rash.  Neurological: Negative for dizziness.       Objective:   Physical Exam  Constitutional:  thin  HENT:  Mouth/Throat: No oropharyngeal exudate.  Streaking erythema  Cardiovascular: Normal rate, regular rhythm and normal heart sounds.   No murmur heard. Pulmonary/Chest: Effort normal and breath sounds normal. No respiratory distress. She has no wheezes. She has no rales.  Lymphadenopathy:    She has no cervical adenopathy.  Skin:  Fine, patchy erythema on bilateral thighs, very slight          Assessment & Plan:

## 2013-10-17 LAB — T-HELPER CELL (CD4) - (RCID CLINIC ONLY)
CD4 % Helper T Cell: 54 % (ref 33–55)
CD4 T CELL ABS: 590 /uL (ref 400–2700)

## 2013-10-18 LAB — HIV-1 RNA ULTRAQUANT REFLEX TO GENTYP+

## 2013-10-30 ENCOUNTER — Encounter: Payer: Self-pay | Admitting: Internal Medicine

## 2013-10-30 ENCOUNTER — Ambulatory Visit (INDEPENDENT_AMBULATORY_CARE_PROVIDER_SITE_OTHER): Payer: Self-pay | Admitting: Internal Medicine

## 2013-10-30 ENCOUNTER — Ambulatory Visit: Payer: Self-pay

## 2013-10-30 VITALS — BP 133/83 | HR 63 | Temp 98.1°F | Wt 98.0 lb

## 2013-10-30 DIAGNOSIS — Z113 Encounter for screening for infections with a predominantly sexual mode of transmission: Secondary | ICD-10-CM

## 2013-10-30 DIAGNOSIS — B20 Human immunodeficiency virus [HIV] disease: Secondary | ICD-10-CM

## 2013-10-30 DIAGNOSIS — R636 Underweight: Secondary | ICD-10-CM

## 2013-10-30 DIAGNOSIS — Z79899 Other long term (current) drug therapy: Secondary | ICD-10-CM

## 2013-10-30 HISTORY — DX: Encounter for screening for infections with a predominantly sexual mode of transmission: Z11.3

## 2013-10-30 NOTE — Progress Notes (Signed)
   Subjective:    Patient ID: [name], female    DOB: 1986/03/11, 28 y.o.   MRN: [id number]  HPI    Review of Systems     Objective:   Physical Exam        Assessment & Plan:

## 2013-10-30 NOTE — Assessment & Plan Note (Signed)
Doing good now and has meds.  RTC 4 months

## 2013-10-30 NOTE — Progress Notes (Signed)
  Subjective:    Patient ID: [name], female    DOB: May 30, 1985, 28 y.o.   MRN: [id number]  HPI  Here for follow up of HIV.  Unfortunately let ADAP expire and on her last day of meds but got it again and has been since.  Labs reassuring with CD4 of 590, viral load remains undetectable.  Reports continued good compliance otherwise.  Weight loss since finding out she has until the 15th in her apt. On Atazanavir, norvir and Truvada.      Review of Systems  Constitutional: Negative for fever and fatigue.  HENT: Negative for sore throat and trouble swallowing.   Eyes: Negative for visual disturbance.  Respiratory: Negative for cough and shortness of breath.   Cardiovascular: Negative for leg swelling.  Gastrointestinal: Negative for nausea, abdominal pain and diarrhea.  Genitourinary: Negative for genital sores.  Musculoskeletal: Negative for arthralgias and myalgias.  Skin: Negative for rash.  Neurological: Negative for dizziness and headaches.  Hematological: Negative for adenopathy.  Psychiatric/Behavioral: Negative for dysphoric mood.       Objective:   Physical Exam  Constitutional: She appears well-developed and well-nourished. No distress.  HENT:  Mouth/Throat: No oropharyngeal exudate.  Eyes: Right eye exhibits no discharge. Left eye exhibits no discharge. No scleral icterus.  Cardiovascular: Normal rate, regular rhythm and normal heart sounds.   No murmur heard. Pulmonary/Chest: Breath sounds normal. No respiratory distress. She has no wheezes.  Lymphadenopathy:    She has no cervical adenopathy.  Neurological: She is alert.  Skin:  Cold sore          Assessment & Plan:

## 2013-10-30 NOTE — Assessment & Plan Note (Signed)
Encouraged eating more despite stress.

## 2013-11-01 ENCOUNTER — Other Ambulatory Visit: Payer: Self-pay | Admitting: *Deleted

## 2013-11-01 MED ORDER — CLOTRIMAZOLE-BETAMETHASONE 1-0.05 % EX CREA: 1.0000 "application " | TOPICAL_CREAM | Freq: Two times a day (BID) | CUTANEOUS | Status: DC

## 2013-11-02 ENCOUNTER — Other Ambulatory Visit: Payer: Self-pay | Admitting: *Deleted

## 2013-11-02 DIAGNOSIS — B20 Human immunodeficiency virus [HIV] disease: Secondary | ICD-10-CM

## 2013-11-02 MED ORDER — EMTRICITABINE-TENOFOVIR DF 200-300 MG PO TABS: 1.0000 | ORAL_TABLET | Freq: Every day | ORAL | Status: DC

## 2013-11-02 MED ORDER — RITONAVIR 100 MG PO TABS: 100.0000 mg | ORAL_TABLET | Freq: Every day | ORAL | Status: DC

## 2013-11-02 MED ORDER — ATAZANAVIR SULFATE 300 MG PO CAPS: 300.0000 mg | ORAL_CAPSULE | Freq: Every day | ORAL | Status: DC

## 2013-11-02 NOTE — Progress Notes (Signed)
ADAP renewal 

## 2013-11-06 ENCOUNTER — Telehealth: Payer: Self-pay | Admitting: *Deleted

## 2013-11-06 NOTE — Telephone Encounter (Addendum)
Patient called asking for a prescription for a cold sore on her lip.  She states she has been using hydrogen peroxide and toothpaste on it, but it is now painful and not healing.   RN cautioned against peroxide, suggested OTC ointments to protect the skin.  Pt would like a prescription. Landis Gandy, RN

## 2013-11-07 ENCOUNTER — Other Ambulatory Visit: Payer: Self-pay | Admitting: Internal Medicine

## 2013-11-07 MED ORDER — ACYCLOVIR 5 % EX OINT: 1.0000 "application " | TOPICAL_OINTMENT | CUTANEOUS | Status: DC

## 2013-11-29 ENCOUNTER — Encounter: Payer: Self-pay | Admitting: Internal Medicine

## 2013-11-29 ENCOUNTER — Ambulatory Visit (INDEPENDENT_AMBULATORY_CARE_PROVIDER_SITE_OTHER): Payer: Self-pay | Admitting: Internal Medicine

## 2013-11-29 ENCOUNTER — Encounter: Payer: Self-pay | Admitting: *Deleted

## 2013-11-29 VITALS — BP 116/75 | HR 83 | Temp 98.0°F | Ht 64.0 in | Wt 100.0 lb

## 2013-11-29 DIAGNOSIS — K089 Disorder of teeth and supporting structures, unspecified: Secondary | ICD-10-CM

## 2013-11-29 DIAGNOSIS — Z30011 Encounter for initial prescription of contraceptive pills: Secondary | ICD-10-CM

## 2013-11-29 DIAGNOSIS — K0889 Other specified disorders of teeth and supporting structures: Secondary | ICD-10-CM

## 2013-11-29 DIAGNOSIS — Z3009 Encounter for other general counseling and advice on contraception: Secondary | ICD-10-CM

## 2013-11-29 MED ORDER — HYDROCODONE-ACETAMINOPHEN 5-325 MG PO TABS: 1.0000 | ORAL_TABLET | Freq: Four times a day (QID) | ORAL | Status: DC | PRN

## 2013-11-29 MED ORDER — NORGESTIM-ETH ESTRAD TRIPHASIC 0.18/0.215/0.25 MG-35 MCG PO TABS: 1.0000 | ORAL_TABLET | Freq: Every day | ORAL | Status: DC
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
## 2013-11-29 NOTE — Patient Instructions (Signed)
Pt to return this afternoon for appt w/ Dr. Baxter Flattery.  Pt. verbalized understanding.

## 2013-11-29 NOTE — Progress Notes (Signed)
Patient ID: [name], female   DOB: [birth year], 28 y.o.   MRN: [id number] Walk-in to [facility] for tooth pain.  Requesting appt.  Scheduled appt for this PM w/ Dr. Baxter Flattery.  Will notify [facility] of tooth pain and needing appt.

## 2013-12-04 ENCOUNTER — Other Ambulatory Visit: Payer: Self-pay | Admitting: Internal Medicine

## 2013-12-05 ENCOUNTER — Other Ambulatory Visit: Payer: Self-pay | Admitting: Licensed Clinical Social Worker

## 2013-12-05 DIAGNOSIS — B20 Human immunodeficiency virus [HIV] disease: Secondary | ICD-10-CM

## 2013-12-05 MED ORDER — ATAZANAVIR SULFATE 300 MG PO CAPS: 300.0000 mg | ORAL_CAPSULE | Freq: Every day | ORAL | Status: DC

## 2013-12-05 MED ORDER — EMTRICITABINE-TENOFOVIR DF 200-300 MG PO TABS: 1.0000 | ORAL_TABLET | Freq: Every day | ORAL | Status: DC

## 2013-12-05 MED ORDER — RITONAVIR 100 MG PO TABS: 100.0000 mg | ORAL_TABLET | Freq: Every day | ORAL | Status: DC

## 2013-12-15 ENCOUNTER — Other Ambulatory Visit: Payer: Self-pay | Admitting: *Deleted

## 2013-12-15 DIAGNOSIS — Z30011 Encounter for initial prescription of contraceptive pills: Secondary | ICD-10-CM

## 2013-12-18 ENCOUNTER — Other Ambulatory Visit: Payer: Self-pay | Admitting: *Deleted

## 2013-12-18 DIAGNOSIS — Z30018 Encounter for initial prescription of other contraceptives: Secondary | ICD-10-CM

## 2014-01-25 ENCOUNTER — Ambulatory Visit (INDEPENDENT_AMBULATORY_CARE_PROVIDER_SITE_OTHER): Payer: Self-pay | Admitting: Internal Medicine

## 2014-01-25 ENCOUNTER — Encounter: Payer: Self-pay | Admitting: Internal Medicine

## 2014-01-25 VITALS — BP 126/84 | HR 79 | Temp 97.3°F | Wt 100.0 lb

## 2014-01-25 DIAGNOSIS — Z79899 Other long term (current) drug therapy: Secondary | ICD-10-CM

## 2014-01-25 DIAGNOSIS — Z113 Encounter for screening for infections with a predominantly sexual mode of transmission: Secondary | ICD-10-CM

## 2014-01-25 DIAGNOSIS — B20 Human immunodeficiency virus [HIV] disease: Secondary | ICD-10-CM

## 2014-01-25 DIAGNOSIS — R21 Rash and other nonspecific skin eruption: Secondary | ICD-10-CM

## 2014-01-25 LAB — COMPLETE METABOLIC PANEL WITH GFR
ALT: 15 U/L (ref 0–35)
AST: 15 U/L (ref 0–37)
Albumin: 3.2 g/dL — ABNORMAL LOW (ref 3.5–5.2)
Alkaline Phosphatase: 122 U/L — ABNORMAL HIGH (ref 39–117)
BUN: 9 mg/dL (ref 6–23)
CALCIUM: 8.7 mg/dL (ref 8.4–10.5)
CO2: 27 meq/L (ref 19–32)
CREATININE: 0.87 mg/dL (ref 0.50–1.10)
Chloride: 96 mEq/L (ref 96–112)
Glucose, Bld: 124 mg/dL — ABNORMAL HIGH (ref 70–99)
Potassium: 3.7 mEq/L (ref 3.5–5.3)
Sodium: 131 mEq/L — ABNORMAL LOW (ref 135–145)
Total Bilirubin: 1.6 mg/dL — ABNORMAL HIGH (ref 0.2–1.2)
Total Protein: 8.6 g/dL — ABNORMAL HIGH (ref 6.0–8.3)

## 2014-01-25 LAB — LIPID PANEL
CHOLESTEROL: 98 mg/dL (ref 0–200)
HDL: 30 mg/dL — ABNORMAL LOW (ref >39)
LDL Cholesterol: 53 mg/dL (ref 0–99)
Total CHOL/HDL Ratio: 3.3 Ratio
Triglycerides: 74 mg/dL (ref <150)
VLDL: 15 mg/dL (ref 0–40)

## 2014-01-25 MED ORDER — ACYCLOVIR 5 % EX OINT: 1.0000 "application " | TOPICAL_OINTMENT | Freq: Three times a day (TID) | CUTANEOUS | Status: DC
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
## 2014-01-26 LAB — CBC WITH DIFFERENTIAL/PLATELET
Basophils Absolute: 0 10*3/uL (ref 0.0–0.1)
Basophils Relative: 0 % (ref 0–1)
EOS ABS: 0.1 10*3/uL (ref 0.0–0.7)
Eosinophils Relative: 1 % (ref 0–5)
HCT: 32.2 % — ABNORMAL LOW (ref 36.0–46.0)
Hemoglobin: 10.5 g/dL — ABNORMAL LOW (ref 12.0–15.0)
LYMPHS PCT: 8 % — AB (ref 12–46)
Lymphs Abs: 0.8 10*3/uL (ref 0.7–4.0)
MCH: 27.9 pg (ref 26.0–34.0)
MCHC: 32.6 g/dL (ref 30.0–36.0)
MCV: 85.6 fL (ref 78.0–100.0)
Monocytes Absolute: 0.5 10*3/uL (ref 0.1–1.0)
Monocytes Relative: 5 % (ref 3–12)
NEUTROS ABS: 8.9 10*3/uL — AB (ref 1.7–7.7)
Neutrophils Relative %: 86 % — ABNORMAL HIGH (ref 43–77)
PLATELETS: 452 10*3/uL — AB (ref 150–400)
RBC: 3.76 MIL/uL — AB (ref 3.87–5.11)
RDW: 21 % — ABNORMAL HIGH (ref 11.5–15.5)
WBC: 10.3 10*3/uL (ref 4.0–10.5)

## 2014-01-26 LAB — RPR

## 2014-01-26 LAB — URINE CYTOLOGY ANCILLARY ONLY
Chlamydia: NEGATIVE
Neisseria Gonorrhea: NEGATIVE

## 2014-01-26 LAB — T-HELPER CELL (CD4) - (RCID CLINIC ONLY)
CD4 % Helper T Cell: 52 % (ref 33–55)
CD4 T Cell Abs: 470 /uL (ref 400–2700)

## 2014-01-28 LAB — HIV-1 RNA QUANT-NO REFLEX-BLD: HIV-1 RNA Quant, Log: <1.3 {Log} (ref <1.30)

## 2014-01-29 ENCOUNTER — Telehealth: Payer: Self-pay | Admitting: *Deleted

## 2014-01-29 ENCOUNTER — Encounter: Payer: Self-pay | Admitting: Obstetrics & Gynecology

## 2014-01-29 NOTE — Telephone Encounter (Signed)
Needs to make a new appt at [facility], BC/PAP smear

## 2014-02-20 ENCOUNTER — Other Ambulatory Visit: Payer: Self-pay

## 2014-02-22 ENCOUNTER — Encounter: Payer: Self-pay | Admitting: Internal Medicine

## 2014-02-22 ENCOUNTER — Other Ambulatory Visit: Payer: Self-pay | Admitting: *Deleted

## 2014-02-22 ENCOUNTER — Ambulatory Visit (INDEPENDENT_AMBULATORY_CARE_PROVIDER_SITE_OTHER): Payer: Self-pay | Admitting: Internal Medicine

## 2014-02-22 VITALS — BP 115/76 | HR 85 | Temp 98.4°F | Ht 64.0 in | Wt 99.0 lb

## 2014-02-22 DIAGNOSIS — F32A Depression, unspecified: Secondary | ICD-10-CM

## 2014-02-22 DIAGNOSIS — B20 Human immunodeficiency virus [HIV] disease: Secondary | ICD-10-CM

## 2014-02-22 DIAGNOSIS — A630 Anogenital (venereal) warts: Secondary | ICD-10-CM

## 2014-02-22 DIAGNOSIS — F329 Major depressive disorder, single episode, unspecified: Secondary | ICD-10-CM

## 2014-02-22 MED ORDER — HYDROCORTISONE 1 % EX OINT: 1.0000 "application " | TOPICAL_OINTMENT | Freq: Two times a day (BID) | CUTANEOUS | Status: DC

## 2014-02-22 NOTE — Assessment & Plan Note (Signed)
I am not sure if this is condyloma or other rash.  I am going to ask gynecology to evaluate.  ? If biopsy needed.

## 2014-02-22 NOTE — Assessment & Plan Note (Signed)
Going to [employer].

## 2014-02-22 NOTE — Assessment & Plan Note (Signed)
Well controlled.  RTC 4 months.

## 2014-02-22 NOTE — Progress Notes (Signed)
   Subjective:    Patient ID: [name], female    DOB: 1986-01-18, 28 y.o.   MRN: [id number]  HPI  Here for follow up of HIV.  Continues on Reyataz, norvir and Truvada and denies any missed doses.  No issues with HIV.  CD4 470 and a continued undetectable vl.      She also has contiued problems with anogenital rash.  She states it is very itchy and has been there for more than a year.  She saw my partner and started on zovirax but with no relief.  Is painful with sex and uncomfortable walking.      Review of Systems  Constitutional: Negative for fever and fatigue.  HENT: Negative for trouble swallowing.   Respiratory: Negative for shortness of breath.   Gastrointestinal: Negative for nausea and diarrhea.  Skin: Negative for rash.  Neurological: Negative for dizziness and light-headedness.       Objective:   Physical Exam  Constitutional:  Thin, no change  HENT:  Mouth/Throat: No oropharyngeal exudate.  Eyes: No scleral icterus.  Cardiovascular: Normal rate, regular rhythm and normal heart sounds.   No murmur heard. Pulmonary/Chest: Effort normal and breath sounds normal. No respiratory distress.  Lymphadenopathy:    She has no cervical adenopathy.  Skin:  She has a thickened, darkened pruritic rash in inguinal fold, perianally and on labia.            Assessment & Plan:

## 2014-02-26 ENCOUNTER — Other Ambulatory Visit: Payer: Self-pay | Admitting: Internal Medicine

## 2014-02-26 ENCOUNTER — Encounter: Payer: Self-pay | Admitting: Internal Medicine

## 2014-02-26 DIAGNOSIS — B009 Herpesviral infection, unspecified: Secondary | ICD-10-CM

## 2014-03-06 ENCOUNTER — Ambulatory Visit: Payer: Self-pay | Admitting: Internal Medicine

## 2014-03-28 ENCOUNTER — Encounter: Payer: Self-pay | Admitting: Obstetrics & Gynecology

## 2014-04-05 ENCOUNTER — Ambulatory Visit (INDEPENDENT_AMBULATORY_CARE_PROVIDER_SITE_OTHER): Payer: Self-pay | Admitting: Internal Medicine

## 2014-04-05 ENCOUNTER — Encounter: Payer: Self-pay | Admitting: Internal Medicine

## 2014-04-05 VITALS — BP 104/74 | HR 89 | Temp 98.0°F | Wt 95.0 lb

## 2014-04-05 DIAGNOSIS — A630 Anogenital (venereal) warts: Secondary | ICD-10-CM

## 2014-04-05 DIAGNOSIS — F329 Major depressive disorder, single episode, unspecified: Secondary | ICD-10-CM

## 2014-04-05 DIAGNOSIS — B20 Human immunodeficiency virus [HIV] disease: Secondary | ICD-10-CM

## 2014-04-05 DIAGNOSIS — F32A Depression, unspecified: Secondary | ICD-10-CM

## 2014-04-05 DIAGNOSIS — B009 Herpesviral infection, unspecified: Secondary | ICD-10-CM

## 2014-04-05 NOTE — Assessment & Plan Note (Signed)
He is going to continue to get her care at [facility]. If she has any issues with her new counselor, she will call to consider counseling with our counselor here.

## 2014-04-05 NOTE — Assessment & Plan Note (Signed)
She will use her Zovirax for her lips when needed

## 2014-04-05 NOTE — Assessment & Plan Note (Signed)
She is going to reschedule with gynecology.

## 2014-04-05 NOTE — Progress Notes (Signed)
   Subjective:    Patient ID: [name], female    DOB: 10/23/1985, 28 y.o.   MRN: [id number]  HPI  Here for follow up of HIV.  Continues on Reyataz, norvir and Truvada and denies any missed doses.  No issues with HIV.  CD4 470 and a continued undetectable vl.      She also has contiued problems with anogenital rash but did not go to the gynecologist.  She has had outbreaks of cold sores on her lips.       Review of Systems  Constitutional: Positive for unexpected weight change. Negative for fever and fatigue.  HENT: Negative for trouble swallowing.   Respiratory: Negative for shortness of breath.   Gastrointestinal: Negative for nausea and diarrhea.  Skin: Negative for rash.  Neurological: Negative for dizziness and light-headedness.  Psychiatric/Behavioral: Positive for dysphoric mood.       Losing weight with depression not eating.       Objective:   Physical Exam  Constitutional: She appears well-developed and well-nourished.  HENT:  Mouth/Throat: No oropharyngeal exudate.  Eyes: No scleral icterus.  Cardiovascular: Normal rate, regular rhythm and normal heart sounds.   No murmur heard. Pulmonary/Chest: Effort normal and breath sounds normal. No respiratory distress.  Lymphadenopathy:    She has no cervical adenopathy.  Skin: No rash noted.          Assessment & Plan:

## 2014-04-05 NOTE — Assessment & Plan Note (Signed)
He is doing great from this standpoint and will return in 3 months with labs prior to the visit.

## 2014-04-30 ENCOUNTER — Telehealth: Payer: Self-pay | Admitting: *Deleted

## 2014-04-30 NOTE — Telephone Encounter (Signed)
Fever, N/V, decreased appetite, night sweats x 2-3 days.  Requesting appt.  Given appt for 05/01/14 @ 1000 w/ Dr. Linus Salmons.

## 2014-05-01 ENCOUNTER — Encounter: Payer: Self-pay | Admitting: Internal Medicine

## 2014-05-01 ENCOUNTER — Ambulatory Visit (INDEPENDENT_AMBULATORY_CARE_PROVIDER_SITE_OTHER): Payer: Self-pay | Admitting: Internal Medicine

## 2014-05-01 VITALS — BP 127/83 | HR 111 | Temp 98.3°F | Ht 64.0 in | Wt 93.0 lb

## 2014-05-01 DIAGNOSIS — N926 Irregular menstruation, unspecified: Secondary | ICD-10-CM

## 2014-05-01 DIAGNOSIS — B349 Viral infection, unspecified: Secondary | ICD-10-CM

## 2014-05-01 LAB — POCT URINE PREGNANCY: Preg Test, Ur: NEGATIVE

## 2014-05-01 MED ORDER — OSELTAMIVIR PHOSPHATE 75 MG PO CAPS: 75.0000 mg | ORAL_CAPSULE | Freq: Two times a day (BID) | ORAL | Status: DC

## 2014-05-01 MED ORDER — ONDANSETRON HCL 4 MG PO TABS: 4.0000 mg | ORAL_TABLET | Freq: Three times a day (TID) | ORAL | Status: DC | PRN

## 2014-05-01 NOTE — Progress Notes (Signed)
   Subjective:    Patient ID: [name], female    DOB: 1985-08-09, 29 y.o.   MRN: [id number]  HPI She is here or a work in visit. Has well-controlled HIV. Comes in with nausea and vomiting, diarrhea with fever up to 101 and chills for about 2 weeks. No sick contacts. She is around small kids. Has had poor by mouth intake. No myalgias. No arthralgias. No cough, no shortness of breath, no dysuria.   Review of Systems  Constitutional: Positive for fever, chills, activity change, appetite change and fatigue.  Gastrointestinal: Positive for nausea, vomiting and diarrhea.  Genitourinary: Negative for dysuria and urgency.  Musculoskeletal: Negative for myalgias and arthralgias.  Skin: Negative for rash.  Neurological: Negative for dizziness.       Objective:   Physical Exam  Constitutional:  Thin-appearing, pleasant  Eyes: No scleral icterus.  Cardiovascular: Normal rate, regular rhythm and normal heart sounds.   No murmur heard. Pulmonary/Chest: Effort normal and breath sounds normal. No respiratory distress.  Abdominal: Soft. Bowel sounds are normal. She exhibits no distension.  Skin: No rash noted.          Assessment & Plan:

## 2014-05-01 NOTE — Assessment & Plan Note (Addendum)
Will try Tamiflu in case it is influenza and supportive care with Zofran. I discussed her need to use Gatorade and fluids. I also emphasized she should try to eat since she has lost weight and she has little reserves.  Will also check a urine pregnancy since she has not had her menstrual cycle recently.

## 2014-05-04 ENCOUNTER — Inpatient Hospital Stay (HOSPITAL_COMMUNITY)
Admission: EM | Admit: 2014-05-04 | Discharge: 2014-05-07 | DRG: 977 | Disposition: A | Discharge status: Home / Self Care | Payer: Self-pay | Attending: Oncology | Admitting: Oncology

## 2014-05-04 ENCOUNTER — Telehealth: Payer: Self-pay | Admitting: *Deleted

## 2014-05-04 ENCOUNTER — Encounter (HOSPITAL_COMMUNITY): Payer: Self-pay | Admitting: *Deleted

## 2014-05-04 ENCOUNTER — Emergency Department (HOSPITAL_COMMUNITY): Payer: Self-pay

## 2014-05-04 DIAGNOSIS — R197 Diarrhea, unspecified: Secondary | ICD-10-CM

## 2014-05-04 DIAGNOSIS — E876 Hypokalemia: Secondary | ICD-10-CM | POA: Diagnosis not present

## 2014-05-04 DIAGNOSIS — M79604 Pain in right leg: Secondary | ICD-10-CM | POA: Diagnosis present

## 2014-05-04 DIAGNOSIS — R161 Splenomegaly, not elsewhere classified: Secondary | ICD-10-CM | POA: Diagnosis present

## 2014-05-04 DIAGNOSIS — M79605 Pain in left leg: Secondary | ICD-10-CM

## 2014-05-04 DIAGNOSIS — D591 Other autoimmune hemolytic anemias: Principal | ICD-10-CM | POA: Diagnosis present

## 2014-05-04 DIAGNOSIS — D5911 Warm autoimmune hemolytic anemia: Secondary | ICD-10-CM | POA: Diagnosis present

## 2014-05-04 DIAGNOSIS — R740 Nonspecific elevation of levels of transaminase and lactic acid dehydrogenase [LDH]: Secondary | ICD-10-CM | POA: Diagnosis present

## 2014-05-04 DIAGNOSIS — B20 Human immunodeficiency virus [HIV] disease: Secondary | ICD-10-CM | POA: Diagnosis present

## 2014-05-04 DIAGNOSIS — K529 Noninfective gastroenteritis and colitis, unspecified: Secondary | ICD-10-CM | POA: Diagnosis present

## 2014-05-04 DIAGNOSIS — R079 Chest pain, unspecified: Secondary | ICD-10-CM | POA: Diagnosis present

## 2014-05-04 DIAGNOSIS — R591 Generalized enlarged lymph nodes: Secondary | ICD-10-CM | POA: Insufficient documentation

## 2014-05-04 DIAGNOSIS — R809 Proteinuria, unspecified: Secondary | ICD-10-CM | POA: Diagnosis present

## 2014-05-04 DIAGNOSIS — R0602 Shortness of breath: Secondary | ICD-10-CM

## 2014-05-04 DIAGNOSIS — B009 Herpesviral infection, unspecified: Secondary | ICD-10-CM | POA: Diagnosis present

## 2014-05-04 DIAGNOSIS — E871 Hypo-osmolality and hyponatremia: Secondary | ICD-10-CM | POA: Diagnosis present

## 2014-05-04 DIAGNOSIS — F329 Major depressive disorder, single episode, unspecified: Secondary | ICD-10-CM | POA: Diagnosis present

## 2014-05-04 DIAGNOSIS — R945 Abnormal results of liver function studies: Secondary | ICD-10-CM | POA: Diagnosis present

## 2014-05-04 DIAGNOSIS — R7989 Other specified abnormal findings of blood chemistry: Secondary | ICD-10-CM | POA: Diagnosis present

## 2014-05-04 DIAGNOSIS — F32A Depression, unspecified: Secondary | ICD-10-CM | POA: Diagnosis present

## 2014-05-04 DIAGNOSIS — R636 Underweight: Secondary | ICD-10-CM | POA: Diagnosis present

## 2014-05-04 DIAGNOSIS — R739 Hyperglycemia, unspecified: Secondary | ICD-10-CM | POA: Diagnosis present

## 2014-05-04 DIAGNOSIS — R109 Unspecified abdominal pain: Secondary | ICD-10-CM

## 2014-05-04 DIAGNOSIS — R6510 Systemic inflammatory response syndrome (SIRS) of non-infectious origin without acute organ dysfunction: Secondary | ICD-10-CM | POA: Diagnosis present

## 2014-05-04 DIAGNOSIS — D649 Anemia, unspecified: Secondary | ICD-10-CM

## 2014-05-04 DIAGNOSIS — Z21 Asymptomatic human immunodeficiency virus [HIV] infection status: Secondary | ICD-10-CM | POA: Insufficient documentation

## 2014-05-04 LAB — RETICULOCYTES
RBC.: 1.86 MIL/uL — ABNORMAL LOW (ref 3.87–5.11)
RETIC COUNT ABSOLUTE: 122.8 10*3/uL (ref 19.0–186.0)
Retic Ct Pct: 6.6 % — ABNORMAL HIGH (ref 0.4–3.1)

## 2014-05-04 LAB — CBC
HCT: 18.8 % — ABNORMAL LOW (ref 36.0–46.0)
HEMATOCRIT: 17.8 % — AB (ref 36.0–46.0)
HEMOGLOBIN: 5.9 g/dL — AB (ref 12.0–15.0)
Hemoglobin: 5.3 g/dL — CL (ref 12.0–15.0)
MCH: 31.1 pg (ref 26.0–34.0)
MCH: 28.5 pg (ref 26.0–34.0)
MCHC: 31.4 g/dL (ref 30.0–36.0)
MCHC: 29.8 g/dL — ABNORMAL LOW (ref 30.0–36.0)
MCV: 98.9 fL (ref 78.0–100.0)
MCV: 95.7 fL (ref 78.0–100.0)
PLATELETS: 360 10*3/uL (ref 150–400)
Platelets: 375 10*3/uL (ref 150–400)
RBC: 1.9 MIL/uL — AB (ref 3.87–5.11)
RBC: 1.86 MIL/uL — ABNORMAL LOW (ref 3.87–5.11)
RDW: 23.5 % — ABNORMAL HIGH (ref 11.5–15.5)
RDW: 23.4 % — ABNORMAL HIGH (ref 11.5–15.5)
WBC: 6.4 10*3/uL (ref 4.0–10.5)
WBC: 7.1 10*3/uL (ref 4.0–10.5)

## 2014-05-04 LAB — I-STAT CHEM 8, ED
BUN: 10 mg/dL (ref 6–23)
CREATININE: 0.8 mg/dL (ref 0.50–1.10)
Calcium, Ion: 1.08 mmol/L — ABNORMAL LOW (ref 1.12–1.23)
Chloride: 93 mEq/L — ABNORMAL LOW (ref 96–112)
GLUCOSE: 92 mg/dL (ref 70–99)
HEMATOCRIT: 19 % — AB (ref 36.0–46.0)
Hemoglobin: 6.5 g/dL — CL (ref 12.0–15.0)
Potassium: 4.2 mmol/L (ref 3.5–5.1)
Sodium: 129 mmol/L — ABNORMAL LOW (ref 135–145)
TCO2: 22 mmol/L (ref 0–100)

## 2014-05-04 LAB — I-STAT TROPONIN, ED: Troponin i, poc: 0 ng/mL (ref 0.00–0.08)

## 2014-05-04 LAB — URINE MICROSCOPIC-ADD ON

## 2014-05-04 LAB — URINALYSIS, ROUTINE W REFLEX MICROSCOPIC
Bilirubin Urine: NEGATIVE
GLUCOSE, UA: NEGATIVE mg/dL
Hgb urine dipstick: NEGATIVE
KETONES UR: 15 mg/dL — AB
Nitrite: NEGATIVE
Protein, ur: 30 mg/dL — AB
Specific Gravity, Urine: 1.022 (ref 1.005–1.030)
Urobilinogen, UA: 2 mg/dL — ABNORMAL HIGH (ref 0.0–1.0)
pH: 6 (ref 5.0–8.0)

## 2014-05-04 LAB — BASIC METABOLIC PANEL
Anion gap: 10 (ref 5–15)
BUN: 8 mg/dL (ref 6–23)
CO2: 26 mmol/L (ref 19–32)
CREATININE: 0.82 mg/dL (ref 0.50–1.10)
Calcium: 7.9 mg/dL — ABNORMAL LOW (ref 8.4–10.5)
Chloride: 93 mEq/L — ABNORMAL LOW (ref 96–112)
GFR calc Af Amer: >90 mL/min (ref >90)
GFR calc non Af Amer: >90 mL/min (ref >90)
GLUCOSE: 104 mg/dL — AB (ref 70–99)
Potassium: 4.4 mmol/L (ref 3.5–5.1)
SODIUM: 129 mmol/L — AB (ref 135–145)

## 2014-05-04 LAB — HEPATIC FUNCTION PANEL
ALBUMIN: 2 g/dL — AB (ref 3.5–5.2)
ALT: 85 U/L — ABNORMAL HIGH (ref 0–35)
AST: 152 U/L — AB (ref 0–37)
Alkaline Phosphatase: 144 U/L — ABNORMAL HIGH (ref 39–117)
Bilirubin, Direct: 0.4 mg/dL — ABNORMAL HIGH (ref 0.0–0.3)
Indirect Bilirubin: 2.5 mg/dL — ABNORMAL HIGH (ref 0.3–0.9)
TOTAL PROTEIN: 8.1 g/dL (ref 6.0–8.3)
Total Bilirubin: 2.9 mg/dL — ABNORMAL HIGH (ref 0.3–1.2)

## 2014-05-04 LAB — I-STAT BETA HCG BLOOD, ED (MC, WL, AP ONLY)

## 2014-05-04 LAB — I-STAT CG4 LACTIC ACID, ED: LACTIC ACID, VENOUS: 2.09 mmol/L (ref 0.5–2.2)

## 2014-05-04 LAB — PREPARE RBC (CROSSMATCH)

## 2014-05-04 LAB — POC OCCULT BLOOD, ED: Fecal Occult Bld: NEGATIVE

## 2014-05-04 MED ORDER — MORPHINE SULFATE 4 MG/ML IJ SOLN
4.0000 mg | Freq: Once | INTRAMUSCULAR | Status: AC
  Administered 2014-05-04: 4 mg via INTRAVENOUS
  Filled 2014-05-04: qty 1

## 2014-05-04 MED ORDER — VANCOMYCIN HCL IN DEXTROSE 1-5 GM/200ML-% IV SOLN
1000.0000 mg | Freq: Once | INTRAVENOUS | Status: DC
  Filled 2014-05-04: qty 200

## 2014-05-04 MED ORDER — RITONAVIR 100 MG PO TABS
100.0000 mg | ORAL_TABLET | Freq: Every day | ORAL | Status: DC
  Administered 2014-05-04: 100 mg via ORAL
  Filled 2014-05-04 (×2): qty 1

## 2014-05-04 MED ORDER — EMTRICITABINE-TENOFOVIR DF 200-300 MG PO TABS
1.0000 | ORAL_TABLET | Freq: Every day | ORAL | Status: DC
  Administered 2014-05-04 – 2014-05-05 (×2): 1 via ORAL
  Filled 2014-05-04 (×2): qty 1

## 2014-05-04 MED ORDER — RITONAVIR 100 MG PO TABS
100.0000 mg | ORAL_TABLET | Freq: Every day | ORAL | Status: DC
  Filled 2014-05-04: qty 1

## 2014-05-04 MED ORDER — SODIUM CHLORIDE 0.9 % IV SOLN: 10.0000 mL/h | Freq: Once | INTRAVENOUS | Status: DC

## 2014-05-04 MED ORDER — VANCOMYCIN HCL 500 MG IV SOLR
500.0000 mg | Freq: Three times a day (TID) | INTRAVENOUS | Status: DC
  Administered 2014-05-04: 500 mg via INTRAVENOUS
  Filled 2014-05-04 (×2): qty 500

## 2014-05-04 MED ORDER — SODIUM CHLORIDE 0.9 % IV SOLN
INTRAVENOUS | Status: AC
Start: 2014-05-04 — End: 2014-05-05
  Administered 2014-05-04 – 2014-05-05 (×2): via INTRAVENOUS

## 2014-05-04 MED ORDER — SODIUM CHLORIDE 0.9 % IV BOLUS (SEPSIS)
2000.0000 mL | Freq: Once | INTRAVENOUS | Status: AC
  Administered 2014-05-04: 2000 mL via INTRAVENOUS

## 2014-05-04 MED ORDER — PIPERACILLIN-TAZOBACTAM 3.375 G IVPB 30 MIN
3.3750 g | Freq: Once | INTRAVENOUS | Status: AC
  Administered 2014-05-04: 3.375 g via INTRAVENOUS
  Filled 2014-05-04: qty 50

## 2014-05-04 MED ORDER — ATAZANAVIR SULFATE 150 MG PO CAPS
300.0000 mg | ORAL_CAPSULE | Freq: Every day | ORAL | Status: DC
  Administered 2014-05-04: 300 mg via ORAL
  Filled 2014-05-04 (×2): qty 2

## 2014-05-04 MED ORDER — ONDANSETRON HCL 4 MG/2ML IJ SOLN
4.0000 mg | Freq: Once | INTRAMUSCULAR | Status: AC
  Administered 2014-05-04: 4 mg via INTRAVENOUS
  Filled 2014-05-04: qty 2

## 2014-05-04 MED ORDER — ACETAMINOPHEN 325 MG PO TABS
650.0000 mg | ORAL_TABLET | Freq: Once | ORAL | Status: AC
Start: 2014-05-04 — End: 2014-05-04
  Administered 2014-05-04: 650 mg via ORAL
  Filled 2014-05-04: qty 2

## 2014-05-04 MED ORDER — TRAZODONE HCL 50 MG PO TABS
50.0000 mg | ORAL_TABLET | Freq: Every day | ORAL | Status: DC
  Administered 2014-05-04 – 2014-05-06 (×3): 50 mg via ORAL
  Filled 2014-05-04 (×4): qty 1

## 2014-05-04 MED ORDER — PIPERACILLIN-TAZOBACTAM 3.375 G IVPB
3.3750 g | Freq: Three times a day (TID) | INTRAVENOUS | Status: DC
  Administered 2014-05-05: 3.375 g via INTRAVENOUS
  Filled 2014-05-04 (×3): qty 50

## 2014-05-04 MED ORDER — IBUPROFEN 400 MG PO TABS
400.0000 mg | ORAL_TABLET | ORAL | Status: DC | PRN
  Administered 2014-05-04: 400 mg via ORAL
  Filled 2014-05-04 (×2): qty 1

## 2014-05-04 MED ORDER — SODIUM CHLORIDE 0.9 % IJ SOLN
3.0000 mL | Freq: Two times a day (BID) | INTRAMUSCULAR | Status: DC
Start: 2014-05-04 — End: 2014-05-07
  Administered 2014-05-04 – 2014-05-07 (×2): 3 mL via INTRAVENOUS

## 2014-05-04 MED ORDER — ONDANSETRON HCL 4 MG PO TABS
4.0000 mg | ORAL_TABLET | Freq: Three times a day (TID) | ORAL | Status: DC | PRN
Start: 2014-05-04 — End: 2014-05-05

## 2014-05-04 MED ORDER — ATAZANAVIR SULFATE 150 MG PO CAPS
300.0000 mg | ORAL_CAPSULE | Freq: Every day | ORAL | Status: DC
  Filled 2014-05-04: qty 2

## 2014-05-04 MED ORDER — BUPROPION HCL ER (XL) 150 MG PO TB24
150.0000 mg | ORAL_TABLET | Freq: Every day | ORAL | Status: DC
  Administered 2014-05-04 – 2014-05-07 (×4): 150 mg via ORAL
  Filled 2014-05-04 (×4): qty 1

## 2014-05-04 NOTE — ED Notes (Signed)
Patient states she has not felt this week.  She has sob, cough, chest pain and fever.  Patient was seen by her MD and given tamiflu.  She states since she started taking med she has had chest pain.  Patient noted to have fever.  Patient is also having n/v.  Patient has not had any meds today.

## 2014-05-04 NOTE — Telephone Encounter (Signed)
Pt seen 2 days ago by Dr. Linus Salmons.  Placed on Tamiflu.  No fever now.  Pt c/o headache, chest discomfort and slight nausea/vomiting.  Pt has approx. 4 tablets of Tamiflu left to take.  RN advised pt to complete all of the prescription; to drink a "dental cup-size" glass of water every hour to remain hydrated and to eat small. frequent meals to avoid vomiting.  RN advised pt that if symptoms worsen, she develops a fever or vomiting increases that she should go to urgent care or ED.  Pt verbalized understanding of this advice.

## 2014-05-04 NOTE — Progress Notes (Signed)
ANTIBIOTIC CONSULT NOTE - INITIAL  Pharmacy Consult for Vancomycin & Zosyn Indication: rule out sepsis  No Known Allergies  Patient Measurements: Height: 5\' 4"  (162.6 cm) Weight: 93 lb (42.185 kg) IBW/kg (Calculated) : 54.7  Vital Signs: Temp: 102.2 F (39 C) (01/08 1402) Temp Source: Oral (01/08 1402) BP: 102/60 mmHg (01/08 1402) Pulse Rate: 111 (01/08 1402) Intake/Output from previous day:   Intake/Output from this shift:    Labs:  Recent Labs  05/04/14 1410 05/04/14 1600 05/04/14 1610  WBC 6.4 7.1  --   HGB 5.9* 5.3* 6.5*  PLT 375 360  --   CREATININE 0.82  --  0.80   Estimated Creatinine Clearance: 69.1 mL/min (by C-G formula based on Cr of 0.8). No results for input(s): VANCOTROUGH, VANCOPEAK, VANCORANDOM, GENTTROUGH, GENTPEAK, GENTRANDOM, TOBRATROUGH, TOBRAPEAK, TOBRARND, AMIKACINPEAK, AMIKACINTROU, AMIKACIN in the last 72 hours.   Microbiology: No results found for this or any previous visit (from the past 720 hour(s)).  Medical History: Past Medical History  Diagnosis Date  . HIV (human immunodeficiency virus infection)   . Depression   . Anemia   . HSV (herpes simplex virus) infection   . History of shingles   . AIN III (anal intraepithelial neoplasia III)   . Condyloma acuminatum in female   . History of chronic bronchitis   . History of esophagitis     CANDIDA  . Periodontitis, chronic     Medications:  Scheduled:  .  morphine injection  4 mg Intravenous Once  . ondansetron (ZOFRAN) IV  4 mg Intravenous Once   Infusions:  . sodium chloride    . piperacillin-tazobactam    . [START ON 05/05/2014] piperacillin-tazobactam (ZOSYN)  IV    . [START ON 05/05/2014] vancomycin    . vancomycin     Assessment: CC: NORVELL URESTE is an 29 y.o. female admitted on 05/04/2014 presenting with sob/ cough, cp and fever.  Patient was seen by MD and given tamiflu but reports initiation of CP.  Pharmacy has been consulted to dose vancomycin and zosyn for sepsis.     PMH: HIV - PTA truvada, ritonavir, and atazanavir  Infectious Disease: Tmax 102.2, now afebrile.  HR improved 96.  WBC wnl.    HIV - CD4 01/2014 470, HIV RNA 01/25/14 < 20  Vancomycin 1000 mg IV x 1  Zosyn 3.375 g IV x 1   Nephrology: Scr 0.80, Crcl ~70  Plan: - Vancomycin 500 mg IV Q8H - Zosyn 3.375 g IV Q8H - Monitor renal function and clinical efficacy - Trough levels when appropriate - F/U cultures  Hassie Bruce, Pharm. D. Clinical Pharmacy Resident Pager: [phone] Ph: [phone] 05/04/2014 5:30 PM

## 2014-05-04 NOTE — Progress Notes (Signed)
NURSING PROGRESS NOTE  ELYNOR KALLENBERGER [id number] Admission Data: 05/04/2014 7:36 PM Attending Provider: Annia Belt, MD GBT:DVVOH, Herbie [place], MD Code Status: Full  [name] is a 29 y.o. female patient admitted from ED:  -No acute distress noted.  -No complaints of shortness of breath.  -No complaints of chest pain.   Blood pressure 100/58, pulse 100, temperature 98.6 F (37 C), temperature source Oral, resp. rate 18, height 5\' 4"  (1.626 m), weight 42.956 kg (94 lb 11.2 oz), last menstrual period 03/19/2014, SpO2 100 %.   IV Fluids:  IV in place, occlusive dsg intact without redness, IV cath antecubital right, condition patent and no redness normal saline.   Allergies:  Review of patient's allergies indicates no known allergies.  Past Medical History:   has a past medical history of HIV (human immunodeficiency virus infection); Depression; Anemia; HSV (herpes simplex virus) infection; History of shingles; AIN III (anal intraepithelial neoplasia III); Condyloma acuminatum in female; History of chronic bronchitis; History of esophagitis; and Periodontitis, chronic.  Past Surgical History:   has past surgical history that includes Dilation and curettage of uterus (2005); Examination under anesthesia (N/A, 09/23/2012); and Laser ablation condolamata (N/A, 09/23/2012).  Social History:   reports that she has quit smoking. Her smoking use included Cigarettes. She started smoking about 6 weeks ago. She has a 3.5 pack-year smoking history. She has never used smokeless tobacco. She reports that she uses illicit drugs (Marijuana). She reports that she does not drink alcohol.  Skin: Multiple tattoos and some piercings.  No other skin issues.  Patient/Family orientated to room. Information packet given to patient/family. Admission inpatient armband information verified with patient/family to include name and date of birth and placed on patient arm. Side rails up x 2, fall assessment and  education completed with patient/family. Patient/family able to verbalize understanding of risk associated with falls and verbalized understanding to call for assistance before getting out of bed. Call light within reach. Patient/family able to voice and demonstrate understanding of unit orientation instructions.    Will continue to evaluate and treat per MD orders.

## 2014-05-04 NOTE — ED Provider Notes (Signed)
CSN: [id number]     Arrival date & time 05/04/14  1352 History   First MD Initiated Contact with Patient 05/04/14 1535     Chief Complaint  Patient presents with  . Fever  . Chest Pain  . Shortness of Breath     (Consider location/radiation/quality/duration/timing/severity/associated sxs/prior Treatment) HPI   [name] is a 29 y.o. female with past medical history significant for well-controlled HIV (followed by Dr. Linus Salmons last CD4 count in October 2015 was 470) complaining of 2 months of productive cough, chest pain (retrosternal, pleuritic, 8/10), shortness of breath, intermittent vomiting. Was started on Tamiflu on the fifth of this month by Dr. Linus Salmons, since she started the antivirals she reports a greenish diarrhea. Denies heavy menstruation, melena, hematochezia, hematemesis. Denies cervicalgia, was not aware she was febrile, denies abdominal pain, abnormal vaginal discharge, dysuria, hematuria, rash, change in vision, dysarthria, ataxia. On review of systems she endorses a 8 out of 10 global headache which is atypical for her. States she's been compliant with her HIV medications. She reports her last menstrual period was December 23.  CD4: 01/2014 470  Past Medical History  Diagnosis Date  . HIV (human immunodeficiency virus infection)   . Depression   . Anemia   . HSV (herpes simplex virus) infection   . History of shingles   . AIN III (anal intraepithelial neoplasia III)   . Condyloma acuminatum in female   . History of chronic bronchitis   . History of esophagitis     CANDIDA  . Periodontitis, chronic    Past Surgical History  Procedure Laterality Date  . Dilation and curettage of uterus  2005    MISSED AB  . Examination under anesthesia N/A 09/23/2012    Procedure: EXAM UNDER ANESTHESIA;  Surgeon: Adin Hector, MD;  Location: [facility];  Service: General;  Laterality: N/A;  . Laser ablation condolamata N/A 09/23/2012    Procedure:  REMOVAL/ABLATION  ABLATION CONDOLAMATA WARTS;  Surgeon: Adin Hector, MD;  Location: [facility];  Service: General;  Laterality: N/A;   No family history on file. History  Substance Use Topics  . Smoking status: Former Smoker -- 0.50 packs/day for 7 years    Types: Cigarettes    Start date: 03/19/2014  . Smokeless tobacco: Never Used  . Alcohol Use: No   OB History    Gravida Para Term Preterm AB TAB SAB Ectopic Multiple Living   3 0 0  2  1 1   0     Review of Systems  10 systems reviewed and found to be negative, except as noted in the HPI.   Allergies  Review of patient's allergies indicates no known allergies.  Home Medications   Prior to Admission medications   Medication Sig Start Date End Date Taking? Authorizing Provider  atazanavir (REYATAZ) 300 MG capsule Take 1 capsule (300 mg total) by mouth daily with breakfast. TAKE 1 CAPSULE BY MOUTH DAILY WITH BREAKFAST. TAKE WITH NORVIR 12/05/13   Thayer Headings, MD  buPROPion (WELLBUTRIN XL) 150 MG 24 hr tablet Take 150 mg by mouth daily.    Historical Provider, MD  emtricitabine-tenofovir (TRUVADA) 200-300 MG per tablet Take 1 tablet by mouth daily. TAKE 1 TABLET BY MOUTH DAILY 12/05/13   Thayer Headings, MD  Norgestimate-Ethinyl Estradiol Triphasic 0.18/0.215/0.25 MG-35 MCG tablet Take 1 tablet by mouth daily. Patient not taking: Reported on 04/05/2014 11/29/13   Carlyle Basques, MD  ondansetron (ZOFRAN) 4 MG tablet  Take 1 tablet (4 mg total) by mouth every 8 (eight) hours as needed for nausea or vomiting. 05/01/14   Thayer Headings, MD  oseltamivir (TAMIFLU) 75 MG capsule Take 1 capsule (75 mg total) by mouth 2 (two) times daily. 05/01/14   Thayer Headings, MD  Polyvinyl Alcohol-Povidone (CLEAR EYES ALL SEASONS) 5-6 MG/ML SOLN Apply 2 drops to eye as needed (dry eyes).    Historical Provider, MD  ritonavir (NORVIR) 100 MG TABS tablet Take 1 tablet (100 mg total) by mouth daily with breakfast. TAKE 1 TABLET BY MOUTH DAILY  . TAKE WITH REYATAZ 12/05/13   Thayer Headings, MD  traZODone (DESYREL) 50 MG tablet Take 50 mg by mouth at bedtime.    Historical Provider, MD  ZOVIRAX 5 % APPLY TOPICALLY TO THE AFFECTED AREA(S) EVERY 8 HOURS 02/26/14   Thayer Headings, MD   BP 102/60 mmHg  Pulse 111  Temp(Src) 102.2 F (39 C) (Oral)  Resp 18  Ht 5\' 4"  (1.626 m)  Wt 93 lb (42.185 kg)  BMI 15.96 kg/m2  SpO2 100%  LMP 03/19/2014 Physical Exam  Constitutional: She is oriented to person, place, and time. She appears well-developed and well-nourished. No distress.  Well-appearing  HENT:  Head: Normocephalic and atraumatic.  Mouth/Throat: Oropharynx is clear and moist.  ++ Conjunctival pallor  Eyes: Conjunctivae and EOM are normal. Pupils are equal, round, and reactive to light. Scleral icterus is present.  Neck: Normal range of motion. Neck supple.  FROM to C-spine. Pt can touch chin to chest without discomfort. No TTP of midline cervical spine.   Cardiovascular: Regular rhythm, normal heart sounds and intact distal pulses.   Tachycardic  Pulmonary/Chest: Effort normal and breath sounds normal. No stridor. No respiratory distress. She has no wheezes. She has no rales. She exhibits no tenderness.  Abdominal: Soft. Bowel sounds are normal. She exhibits no distension and no mass. There is no tenderness. There is no rebound and no guarding.  Musculoskeletal: Normal range of motion. She exhibits no edema or tenderness.  No calf asymmetry, superficial collaterals, palpable cords, edema, Homans sign negative bilaterally.    Neurological: She is alert and oriented to person, place, and time.  Skin: Skin is warm.  Psychiatric: She has a normal mood and affect.  Nursing note and vitals reviewed.   ED Course  Procedures (including critical care time)  CRITICAL CARE Performed by: Monico Blitz   Total critical care time: 45  Critical care time was exclusive of separately billable procedures and treating other  patients.  Critical care was necessary to treat or prevent imminent or life-threatening deterioration.  Critical care was time spent personally by me on the following activities: development of treatment plan with patient and/or surrogate as well as nursing, discussions with consultants, evaluation of patient's response to treatment, examination of patient, obtaining history from patient or surrogate, ordering and performing treatments and interventions, ordering and review of laboratory studies, ordering and review of radiographic studies, pulse oximetry and re-evaluation of patient's condition.  Labs Review Labs Reviewed  BASIC METABOLIC PANEL - Abnormal; Notable for the following:    Sodium 129 (*)    Chloride 93 (*)    Glucose, Bld 104 (*)    Calcium 7.9 (*)    All other components within normal limits  CBC - Abnormal; Notable for the following:    RBC 1.90 (*)    Hemoglobin 5.9 (*)    HCT 18.8 (*)    RDW 23.5 (*)  All other components within normal limits  URINALYSIS, ROUTINE W REFLEX MICROSCOPIC - Abnormal; Notable for the following:    Color, Urine AMBER (*)    APPearance HAZY (*)    Ketones, ur 15 (*)    Protein, ur 30 (*)    Urobilinogen, UA 2.0 (*)    Leukocytes, UA TRACE (*)    All other components within normal limits  RETICULOCYTES - Abnormal; Notable for the following:    Retic Ct Pct 6.6 (*)    RBC. 1.86 (*)    All other components within normal limits  CBC - Abnormal; Notable for the following:    RBC 1.86 (*)    Hemoglobin 5.3 (*)    HCT 17.8 (*)    MCHC 29.8 (*)    RDW 23.4 (*)    All other components within normal limits  URINE MICROSCOPIC-ADD ON - Abnormal; Notable for the following:    Squamous Epithelial / LPF FEW (*)    Bacteria, UA FEW (*)    Casts HYALINE CASTS (*)    All other components within normal limits  I-STAT CHEM 8, ED - Abnormal; Notable for the following:    Sodium 129 (*)    Chloride 93 (*)    Calcium, Ion 1.08 (*)    Hemoglobin  6.5 (*)    HCT 19.0 (*)    All other components within normal limits  URINE CULTURE  CULTURE, BLOOD (ROUTINE X 2)  CULTURE, BLOOD (ROUTINE X 2)  CLOSTRIDIUM DIFFICILE BY PCR  VITAMIN B12  FOLATE  IRON AND TIBC  FERRITIN  T-HELPER CELLS (CD4) COUNT  INFLUENZA PANEL BY PCR (TYPE A & B, H1N1)  HEPATIC FUNCTION PANEL  GI PATHOGEN PANEL BY PCR, STOOL  I-STAT TROPOININ, ED  I-STAT CG4 LACTIC ACID, ED  I-STAT BETA HCG BLOOD, ED ([facility], [facility], [facility] ONLY)  I-STAT BETA HCG BLOOD, ED ([facility], [facility], [facility] ONLY)  POC OCCULT BLOOD, ED  POC OCCULT BLOOD, ED  TYPE AND SCREEN  PREPARE RBC (CROSSMATCH)    Imaging Review Dg Chest 2 View  05/04/2014   CLINICAL DATA:  Chest pain and shortness of breath.  Fever.  EXAM: CHEST  2 VIEW  COMPARISON:  Two-view chest 06/14/2011  FINDINGS: The heart size is normal. The lungs are clear. Mild degenerative changes in the mid thoracic spine are stable.  IMPRESSION: Negative two view chest x-ray   Electronically Signed   By: Lawrence Santiago M.D.   On: 05/04/2014 14:50     EKG Interpretation None      MDM   Final diagnoses:  SOB (shortness of breath)  Chest pain    Filed Vitals:   05/04/14 1402  BP: 102/60  Pulse: 111  Temp: 102.2 F (39 C)  TempSrc: Oral  Resp: 18  Height: 5\' 4"  (1.626 m)  Weight: 93 lb (42.185 kg)  SpO2: 100%    Medications  sodium chloride 0.9 % bolus 2,000 mL (not administered)  piperacillin-tazobactam (ZOSYN) IVPB 3.375 g (not administered)  vancomycin (VANCOCIN) IVPB 1000 mg/200 mL premix (not administered)  morphine 4 MG/ML injection 4 mg (not administered)  ondansetron (ZOFRAN) injection 4 mg (not administered)  acetaminophen (TYLENOL) tablet 650 mg (650 mg Oral Given 05/04/14 1413)    [name] is a pleasant 29 y.o. female presenting with productive cough, nausea vomiting, global headache, shortness of breath and pleuritic chest pain worsening over the course of 2 months. Patient has HIV with essentially normal CD4 count, she  is compliant with  her antiretroviral medications, she is followed by Dr. Novella Olive who started her on Tamiflu 4 days ago. She started taking this medication or take greenish diarrhea. Patient is found to be significantly anemic today with an H&H of 5/10 which is about half of her normal levels. Unclear as to where the blood loss is originating from. Anemia panel was sent, patient will be transfused and she will be started on Vanco and Zosyn as the source of her infection is unclear at this time, chest x-ray is negative. Donnell exam is benign, patient is not reporting any GU or urinary symptoms.  She has a productive cough however her chest x-ray does not show an infiltrate.  Does not report heavy menses, patient is not pregnant. Guaiac is negative. She has a normal MCV.   Urinalysis is not consistent with infection. Urine culture is pending.  Patient reports a greenish diarrhea since she started Tamiflu 2 days ago. GI pathogen and C. difficile are pending.  Case discussed with internal medicine teaching service Dr. Ronnald Ramp who accepts admission to a telemetry bed. The attending physician is [place].   Discussed case with attending MD who agrees with plan.    Monico Blitz, PA-C 05/04/14 [place] Alvino Chapel, MD 05/05/14 1453

## 2014-05-04 NOTE — ED Notes (Signed)
Per blood bank, pt had a positive antigen screening; additional testing needed per lab

## 2014-05-04 NOTE — Progress Notes (Signed)
MD notified due to acute change in patient status. Vital signs and labs are listed below.  MD notified(1st page) Time of 1st page:  2237 Responding MD:  Dr. Ethelene Hal Time MD responded: [id number] MD response: MD stated aware  Vital Signs Filed Vitals:   05/04/14 1700 05/04/14 1811 05/04/14 1812 05/04/14 2223  BP: 102/60 100/58  98/45  Pulse: 91 100  103  Temp:  98.6 F (37 C)  103 F (39.4 C)  TempSrc:    Oral  Resp: 16 18  20   Height:   5\' 4"  (1.626 m)   Weight:   42.956 kg (94 lb 11.2 oz)   SpO2: 100% 100%  100%     Lab Results WBC  Date/Time Value Ref Range Status  05/04/2014 04:00 PM 7.1 4.0 - 10.5 K/uL Final  05/04/2014 02:10 PM 6.4 4.0 - 10.5 K/uL Final  01/25/2014 04:26 PM 10.3 4.0 - 10.5 K/uL Final   NEUTROPHILS RELATIVE %  Date/Time Value Ref Range Status  01/25/2014 04:26 PM 86* 43 - 77 % Final  10/16/2013 11:54 AM 75 43 - 77 % Final  05/03/2013 01:01 PM 69 43 - 77 % Final   No results found for: PCO2ART LACTIC ACID, VENOUS  Date/Time Value Ref Range Status  05/04/2014 04:10 PM 2.09 0.5 - 2.2 mmol/L Final   PCO2, VEN  Date/Time Value Ref Range Status  10/28/2006 08:43 PM 32.9*  Final     Vertell Limber, RN 05/04/2014, 10:38 PM

## 2014-05-04 NOTE — ED Notes (Signed)
i-stat chem 8 result shown to Dr. Alvino Chapel

## 2014-05-05 ENCOUNTER — Inpatient Hospital Stay (HOSPITAL_COMMUNITY): Payer: MEDICAID

## 2014-05-05 ENCOUNTER — Inpatient Hospital Stay (HOSPITAL_COMMUNITY): Payer: Self-pay

## 2014-05-05 DIAGNOSIS — Z21 Asymptomatic human immunodeficiency virus [HIV] infection status: Secondary | ICD-10-CM

## 2014-05-05 DIAGNOSIS — R739 Hyperglycemia, unspecified: Secondary | ICD-10-CM | POA: Diagnosis present

## 2014-05-05 DIAGNOSIS — D591 Other autoimmune hemolytic anemias: Secondary | ICD-10-CM

## 2014-05-05 DIAGNOSIS — R197 Diarrhea, unspecified: Secondary | ICD-10-CM

## 2014-05-05 DIAGNOSIS — M79605 Pain in left leg: Secondary | ICD-10-CM | POA: Diagnosis present

## 2014-05-05 DIAGNOSIS — R112 Nausea with vomiting, unspecified: Secondary | ICD-10-CM

## 2014-05-05 DIAGNOSIS — R7989 Other specified abnormal findings of blood chemistry: Secondary | ICD-10-CM

## 2014-05-05 DIAGNOSIS — M79604 Pain in right leg: Secondary | ICD-10-CM | POA: Diagnosis present

## 2014-05-05 DIAGNOSIS — R945 Abnormal results of liver function studies: Secondary | ICD-10-CM | POA: Diagnosis present

## 2014-05-05 DIAGNOSIS — F329 Major depressive disorder, single episode, unspecified: Secondary | ICD-10-CM

## 2014-05-05 DIAGNOSIS — R809 Proteinuria, unspecified: Secondary | ICD-10-CM | POA: Diagnosis present

## 2014-05-05 DIAGNOSIS — D589 Hereditary hemolytic anemia, unspecified: Secondary | ICD-10-CM

## 2014-05-05 DIAGNOSIS — K529 Noninfective gastroenteritis and colitis, unspecified: Secondary | ICD-10-CM | POA: Diagnosis present

## 2014-05-05 DIAGNOSIS — D5911 Warm autoimmune hemolytic anemia: Secondary | ICD-10-CM | POA: Diagnosis present

## 2014-05-05 DIAGNOSIS — E871 Hypo-osmolality and hyponatremia: Secondary | ICD-10-CM

## 2014-05-05 DIAGNOSIS — R509 Fever, unspecified: Secondary | ICD-10-CM

## 2014-05-05 DIAGNOSIS — R079 Chest pain, unspecified: Secondary | ICD-10-CM

## 2014-05-05 LAB — IRON AND TIBC
Iron: 15 ug/dL — ABNORMAL LOW (ref 42–145)
SATURATION RATIOS: 9 % — AB (ref 20–55)
TIBC: 167 ug/dL — ABNORMAL LOW (ref 250–470)
UIBC: 152 ug/dL (ref 125–400)

## 2014-05-05 LAB — CBC
HCT: 26.4 % — ABNORMAL LOW (ref 36.0–46.0)
Hemoglobin: 8.2 g/dL — ABNORMAL LOW (ref 12.0–15.0)
MCH: 27.2 pg (ref 26.0–34.0)
MCHC: 31.1 g/dL (ref 30.0–36.0)
MCV: 87.7 fL (ref 78.0–100.0)
Platelets: 290 10*3/uL (ref 150–400)
RBC: 3.01 MIL/uL — ABNORMAL LOW (ref 3.87–5.11)
RDW: 21 % — AB (ref 11.5–15.5)
WBC: 5.3 10*3/uL (ref 4.0–10.5)

## 2014-05-05 LAB — BASIC METABOLIC PANEL
ANION GAP: 6 (ref 5–15)
BUN: 7 mg/dL (ref 6–23)
CHLORIDE: 102 meq/L (ref 96–112)
CO2: 24 mmol/L (ref 19–32)
Calcium: 7.4 mg/dL — ABNORMAL LOW (ref 8.4–10.5)
Creatinine, Ser: 0.59 mg/dL (ref 0.50–1.10)
GFR calc Af Amer: >90 mL/min (ref >90)
Glucose, Bld: 133 mg/dL — ABNORMAL HIGH (ref 70–99)
Potassium: 3.9 mmol/L (ref 3.5–5.1)
SODIUM: 132 mmol/L — AB (ref 135–145)

## 2014-05-05 LAB — MRSA PCR SCREENING: MRSA by PCR: NEGATIVE

## 2014-05-05 LAB — DIRECT ANTIGLOBULIN TEST (NOT AT ARMC)
DAT, IGG: POSITIVE
DAT, complement: POSITIVE

## 2014-05-05 LAB — MAGNESIUM: Magnesium: 2.4 mg/dL (ref 1.5–2.5)

## 2014-05-05 LAB — FERRITIN: FERRITIN: 1930 ng/mL — AB (ref 10–291)

## 2014-05-05 LAB — CLOSTRIDIUM DIFFICILE BY PCR: CDIFFPCR: NEGATIVE

## 2014-05-05 LAB — LACTATE DEHYDROGENASE
LDH: 209 U/L (ref 94–250)
LDH: 211 U/L (ref 94–250)

## 2014-05-05 LAB — SEDIMENTATION RATE: Sed Rate: 27 mm/hr — ABNORMAL HIGH (ref 0–22)

## 2014-05-05 LAB — VITAMIN B12: VITAMIN B 12: 1019 pg/mL — AB (ref 211–911)

## 2014-05-05 LAB — TSH: TSH: 2.544 u[IU]/mL (ref 0.350–4.500)

## 2014-05-05 LAB — TECHNOLOGIST SMEAR REVIEW

## 2014-05-05 LAB — FOLATE: Folate: 18.5 ng/mL

## 2014-05-05 LAB — MONONUCLEOSIS SCREEN: Mono Screen: NEGATIVE

## 2014-05-05 MED ORDER — BOOST / RESOURCE BREEZE PO LIQD
1.0000 | Freq: Three times a day (TID) | ORAL | Status: DC
  Administered 2014-05-05: via ORAL
  Administered 2014-05-06 – 2014-05-07 (×4): 1 via ORAL

## 2014-05-05 MED ORDER — ONDANSETRON HCL 4 MG PO TABS
4.0000 mg | ORAL_TABLET | Freq: Four times a day (QID) | ORAL | Status: DC
  Administered 2014-05-05 – 2014-05-06 (×7): 4 mg via ORAL
  Filled 2014-05-05 (×15): qty 1

## 2014-05-05 MED ORDER — CIPROFLOXACIN IN D5W 400 MG/200ML IV SOLN
400.0000 mg | Freq: Two times a day (BID) | INTRAVENOUS | Status: DC
  Administered 2014-05-05 – 2014-05-06 (×4): 400 mg via INTRAVENOUS
  Filled 2014-05-05 (×5): qty 200

## 2014-05-05 MED ORDER — ACETAMINOPHEN 325 MG PO TABS: 325.0000 mg | ORAL_TABLET | Freq: Four times a day (QID) | ORAL | Status: DC | PRN

## 2014-05-05 MED ORDER — PREDNISONE 50 MG PO TABS
60.0000 mg | ORAL_TABLET | Freq: Every day | ORAL | Status: DC
  Administered 2014-05-05: 60 mg via ORAL
  Filled 2014-05-05 (×2): qty 1

## 2014-05-05 MED ORDER — ATAZANAVIR SULFATE 150 MG PO CAPS
300.0000 mg | ORAL_CAPSULE | Freq: Every day | ORAL | Status: DC
  Administered 2014-05-05 – 2014-05-07 (×3): 300 mg via ORAL
  Filled 2014-05-05 (×4): qty 2

## 2014-05-05 MED ORDER — METHYLPREDNISOLONE SODIUM SUCC 125 MG IJ SOLR
60.0000 mg | Freq: Once | INTRAMUSCULAR | Status: AC
  Administered 2014-05-06: 60 mg via INTRAVENOUS
  Filled 2014-05-05: qty 2

## 2014-05-05 MED ORDER — EMTRICITABINE-TENOFOVIR DF 200-300 MG PO TABS
1.0000 | ORAL_TABLET | Freq: Every day | ORAL | Status: DC
  Administered 2014-05-06 – 2014-05-07 (×2): 1 via ORAL
  Filled 2014-05-05 (×3): qty 1

## 2014-05-05 MED ORDER — FOLIC ACID 1 MG PO TABS
1.0000 mg | ORAL_TABLET | Freq: Every day | ORAL | Status: DC
  Administered 2014-05-05 – 2014-05-07 (×3): 1 mg via ORAL
  Filled 2014-05-05 (×3): qty 1

## 2014-05-05 MED ORDER — RITONAVIR 100 MG PO TABS
100.0000 mg | ORAL_TABLET | Freq: Every day | ORAL | Status: DC
  Administered 2014-05-05 – 2014-05-07 (×3): 100 mg via ORAL
  Filled 2014-05-05 (×4): qty 1

## 2014-05-05 MED ORDER — METRONIDAZOLE IN NACL 5-0.79 MG/ML-% IV SOLN
500.0000 mg | Freq: Three times a day (TID) | INTRAVENOUS | Status: DC
  Administered 2014-05-05 – 2014-05-06 (×5): 500 mg via INTRAVENOUS
  Filled 2014-05-05 (×8): qty 100

## 2014-05-05 NOTE — Progress Notes (Signed)
Subjective:  Patient reports that she is doing well this morning. She denies any fevers or chills.  Objective: Vital signs in last 24 hours: Filed Vitals:   05/05/14 0844 05/05/14 1143 05/05/14 1200 05/05/14 1321  BP: 93/55 89/52 92/51 92/53  Pulse: 71 72 69 69  Temp: 94.9 F (34.9 C) 97.4 F (36.3 C) 97.4 F (36.3 C)   TempSrc: Rectal Rectal Rectal   Resp: 20   12  Height:      Weight:      SpO2: 100% 98%  100%   Weight change:   Intake/Output Summary (Last 24 hours) at 05/05/14 1415 Last data filed at 05/05/14 1145  Gross per 24 hour  Intake   5873 ml  Output   1500 ml  Net   4373 ml   Gen: A&O x 4, well developed, thin HEENT: Atraumatic, PERRL, EOMI, sclerae anicteric, moist mucous membranes Heart: tachycardic but regular, normal S1 S2, no murmurs, rubs, or gallops Lungs: Clear to auscultation bilaterally, respirations unlabored Abd: Soft, non-tender, non-distended, + bowel sounds, no splenomegaly appreciated Ext: No edema or cyanosis  Lab Results: Basic Metabolic Panel:  Recent Labs Lab 05/04/14 1410 05/04/14 1610  NA 129* 129*  K 4.4 4.2  CL 93* 93*  CO2 26  --   GLUCOSE 104* 92  BUN 8 10  CREATININE 0.82 0.80  CALCIUM 7.9*  --    Liver Function Tests:  Recent Labs Lab 05/04/14 1600  AST 152*  ALT 85*  ALKPHOS 144*  BILITOT 2.9*  PROT 8.1  ALBUMIN 2.0*   No results for input(s): LIPASE, AMYLASE in the last 168 hours. No results for input(s): AMMONIA in the last 168 hours. CBC:  Recent Labs Lab 05/04/14 1410 05/04/14 1600 05/04/14 1610  WBC 6.4 7.1  --   HGB 5.9* 5.3* 6.5*  HCT 18.8* 17.8* 19.0*  MCV 98.9 95.7  --   PLT 375 360  --    Cardiac Enzymes: No results for input(s): CKTOTAL, CKMB, CKMBINDEX, TROPONINI in the last 168 hours. BNP: No results for input(s): PROBNP in the last 168 hours. D-Dimer: No results for input(s): DDIMER in the last 168 hours. CBG: No results for input(s): GLUCAP in the last 168  hours. Hemoglobin A1C: No results for input(s): HGBA1C in the last 168 hours. Fasting Lipid Panel: No results for input(s): CHOL, HDL, LDLCALC, TRIG, CHOLHDL, LDLDIRECT in the last 168 hours. Thyroid Function Tests: No results for input(s): TSH, T4TOTAL, FREET4, T3FREE, THYROIDAB in the last 168 hours. Coagulation: No results for input(s): LABPROT, INR in the last 168 hours. Anemia Panel:  Recent Labs Lab 05/04/14 1600  VITAMINB12 1019*  FOLATE 18.5  FERRITIN 1930*  TIBC 167*  IRON 15*  RETICCTPCT 6.6*   Urine Drug Screen: Drugs of Abuse     Component Value Date/Time   LABOPIA NONE DETECTED 08/17/2011 1251   COCAINSCRNUR NONE DETECTED 08/17/2011 1251   LABBENZ NONE DETECTED 08/17/2011 1251   AMPHETMU NONE DETECTED 08/17/2011 1251   THCU POSITIVE* 08/17/2011 1251   LABBARB NONE DETECTED 08/17/2011 1251    Alcohol Level: No results for input(s): ETH in the last 168 hours. Urinalysis:  Recent Labs Lab 05/04/14 1626  COLORURINE AMBER*  LABSPEC 1.022  PHURINE 6.0  GLUCOSEU NEGATIVE  HGBUR NEGATIVE  BILIRUBINUR NEGATIVE  KETONESUR 15*  PROTEINUR 30*  UROBILINOGEN 2.0*  NITRITE NEGATIVE  LEUKOCYTESUR TRACE*   Micro Results: Recent Results (from the past 240 hour(s))  Blood culture (routine x 2)  Status: None (Preliminary result)   Collection Time: 05/04/14  3:50 PM  Result Value Ref Range Status   Specimen Description BLOOD RIGHT ARM  Final   Special Requests BOTTLES DRAWN AEROBIC AND ANAEROBIC 10CC  Final   Culture   Final           BLOOD CULTURE RECEIVED NO GROWTH TO DATE CULTURE WILL BE HELD FOR 5 DAYS BEFORE ISSUING A FINAL NEGATIVE REPORT Performed at [facility]    Report Status PENDING  Incomplete  Clostridium Difficile by PCR     Status: None   Collection Time: 05/04/14  4:28 PM  Result Value Ref Range Status   C difficile by pcr NEGATIVE NEGATIVE Final  MRSA PCR Screening     Status: None   Collection Time: 05/05/14  3:35 AM  Result  Value Ref Range Status   MRSA by PCR NEGATIVE NEGATIVE Final    Comment:        The GeneXpert MRSA Assay (FDA approved for NASAL specimens only), is one component of a comprehensive MRSA colonization surveillance program. It is not intended to diagnose MRSA infection nor to guide or monitor treatment for MRSA infections.    Studies/Results: Dg Chest 2 View  05/04/2014   CLINICAL DATA:  Chest pain and shortness of breath.  Fever.  EXAM: CHEST  2 VIEW  COMPARISON:  Two-view chest 06/14/2011  FINDINGS: The heart size is normal. The lungs are clear. Mild degenerative changes in the mid thoracic spine are stable.  IMPRESSION: Negative two view chest x-ray   Electronically Signed   By: Chris  Mattern M.D.   On: 05/04/2014 14:50   Medications: I have reviewed the patient's current medications. Scheduled Meds: . sodium chloride  10 mL/hr Intravenous Once  . atazanavir  300 mg Oral Q breakfast  . buPROPion  150 mg Oral Daily  . ciprofloxacin  400 mg Intravenous Q12H  . emtricitabine-tenofovir  1 tablet Oral Daily  . folic acid  1 mg Oral Daily  . [START ON 05/06/2014] methylPREDNISolone (SOLU-MEDROL) injection  60 mg Intravenous Once  . metronidazole  500 mg Intravenous Q8H  . ondansetron  4 mg Oral 4 times per day  . ritonavir  100 mg Oral Q breakfast  . sodium chloride  3 mL Intravenous Q12H  . traZODone  50 mg Oral QHS   Continuous Infusions: . 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 Thought to be a hemolytic anemia because of positive Coombs' tests remarkable for warm agglutinins. Indirect bilirubin elevated at 2.5. However, LDH normal at 209, haptoglobin still pending. Possible etiology could be EBV given 2 month history of malaise, fevers, and atypical lymphocytes viewed on blood smear. However, Coombs test showing warm agglutinins rather than cold, and patient with no complaints of headache, sore throat, lymphadenopathy, splenomegaly. Mycoplasma infection could also be a possibility given fevers and malaise. However, this is again usually associated with cold agglutinins. However, other conditions usually associated with warm   agglutinins such as systemic lupus erythematosus, lymphoid malignancies are less likely given patient's overall clinical presentation and medical history. Patient also not taking any drugs that are commonly associated with an autoimmune hemolytic anemia. The patient has had exposure to methotrexate which can be associated with a autoimmune hemolytic anemia, this history is much more remote, nearly one year ago from January 2015. Peripheral smear remarkable for atypical monocytes with ameboid shape, atypical nuclear folds, spherocytes, rouleaux formation, but no schistocytes. -Transfuse as necessary, though threshold of hemoglobin of 6 may be acceptable given high risk of transfusion reaction.  -Solumedrol 60 mg daily -folic acid 1 mg po daily -EBV panel, mononucleosis screen, mycoplasma IgM, ANA, ESR, haptoglobin pending. -Transfer from stepdown unit.  Diarrhea, nausea, emesis, fever: Improved as patient is tolerating a regular diet. Patient's initial fever of up to 103, followed by a period of hypothermia down to 94, now resolved with normal temperatures. Tachycardia has also resolved. More common infectious etiologies less likely given negative chest x-ray and negative urinalysis. Patient is at low risk for opportunistic  infections given well-controlled HIV with a CD4 of 470 and undetectable viral load from October 2015. C. difficile negative. -Continue ciprofloxacin and Flagyl for coverage of enteric organisms. -Regular diet -Hepatitis panel, GI pathogen panel, stool ova and parasites, stool leukocytes, stool culture, CMV, microsporidia, lactoferrin -Abdominal ultrasound pending -Blood cultures pending -Continue IV fluids with normal saline at 100 mL per hour -Zofran 4 mg by mouth every 6 hours when necessary  HIV: Followed by Dr Linus Salmons [facility]. Last labs 01/25/14 with HIV RNA <20 and CD4 470. Home meds are atazanavir 300 mg po daily, emtricitabine-tenofovir 200-300 mg po daily, ritonavir 100 mg po daily. It does not appear any of these have hemolytic anemia as a side effect. -CD4 count pending -continue atazanavir 300 mg po daily, emtricitabine-tenofovir 200-300 mg po daily, ritonavir 100 mg po daily  Hyponatremia: Presenting Na 129 likely due to dehydration in the setting of poor po intake. -NS @ 100 cc/hr -cont to monitor  Elevated LFTs: AST of 152 and ALT of 85 with alkaline phosphatase of 144.  -Hepatitis panel pending.   Chest Pain: ACS unlikely given age, troponin negative, EKG with no ischemic changes. CXR clear. -cardiac monitoring  Depression: On bupropion 150 mg po daily and trazodone 50 mg po qhs at home. No noted association with hemolytic anemia. -continue bupropion 150 mg po daily and trazodone 50 mg po qhs   Diet: clear liquid  DVT PPx: SCDs  Code: Full  Dispo: Disposition is deferred at this time, awaiting improvement of current medical problems. Anticipated discharge in approximately 2 day(s).   The patient does have a current PCP Thayer Headings, MD) and does need an [facility] hospital follow-up appointment after discharge.  The patient does not know have transportation limitations that hinder transportation to clinic appointments.   .Services Needed at time of discharge: Y = Yes,  Blank = No PT:   OT:   RN:   Equipment:   Other:     LOS: 1 day   Luan Moore, MD 05/05/2014, 2:15 PM

## 2014-05-05 NOTE — Progress Notes (Signed)
Pt rectal temperature 95.1 Paged Dr. Ethelene Hal, gave orders to recheck rectal temperature. Rechecked temp it was 94.6. Dr. Ethelene Hal was made aware, placed an order for a bear hugger. Will continue to monitor pt.

## 2014-05-05 NOTE — Progress Notes (Signed)
Dr. Adriana Reams gave telephone orders for pt to receive least incompatible blood. Blood bank notified.

## 2014-05-05 NOTE — Progress Notes (Signed)
INITIAL NUTRITION ASSESSMENT  DOCUMENTATION CODES Per approved criteria  -Underweight   INTERVENTION: -Resource Breeze po TID, each supplement provides 250 kcal and 9 grams of protein  NUTRITION DIAGNOSIS: Increased nutrient needs related to HIV as evidenced by estimated needs.   Goal: Pt will meet >90% of estimated nutritional needs  Monitor:  PO/supplement intake, labs, weight changes, I/O's  Reason for Assessment: Consult for poor po intake  29 y.o. 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  ASSESSMENT: RD received consult for poor po intake. Pt unavailable at time of visit. Nutrition-focused physical exam deferred at this time.  Noted decline in wt over the past year. UBW around 100-103#. However, wt changes are no significant for time frame. Intake has been good since admission; noted 75-100%. Suspect hx of poor po intake due to wt loss.  Pt with increased nutrition needs due to HIV. RD will add supplement to promote nutritional adequacy.  Labs reviewed. Na: 125, Cl: 93, Calcium: 7.9.   Height: Ht Readings from Last 1 Encounters:  05/04/14 5\' 4"  (1.626 m)    Weight: Wt Readings from Last 1 Encounters:  05/04/14 94 lb 11.2 oz (42.956 kg)    Ideal Body Weight: 120#  % Ideal Body Weight: 78%  Wt Readings from Last 10 Encounters:  05/04/14 94 lb 11.2 oz (42.956 kg)  05/01/14 93 lb (42.185 kg)  04/05/14 95 lb (43.092 kg)  02/22/14 99 lb (44.906 kg)  01/25/14 100 lb (45.36 kg)  11/29/13 100 lb (45.36 kg)  10/30/13 98 lb (44.453 kg)  10/16/13 99 lb (44.906 kg)  08/21/13 101 lb (45.813 kg)  05/06/13 103 lb 6 oz (46.891 kg)    Usual Body Weight: 103#  % Usual Body Weight: 91%  BMI:  Body mass index is 16.25 kg/(m^2). Underweight  Estimated Nutritional Needs: Kcal: 1400-1600 Protein: 64-74  grams Fluid: 1.4-1.6 L  Skin: Intact  Diet Order: Diet regular  EDUCATION NEEDS: -Education not appropriate at this time   Intake/Output Summary (Last 24 hours) at 05/05/14 1651 Last data filed at 05/05/14 1145  Gross per 24 hour  Intake   5873 ml  Output   1500 ml  Net   4373 ml    Last BM: 05/04/14  Labs:   Recent Labs Lab 05/04/14 1410 05/04/14 1610  NA 129* 129*  K 4.4 4.2  CL 93* 93*  CO2 26  --   BUN 8 10  CREATININE 0.82 0.80  CALCIUM 7.9*  --   GLUCOSE 104* 92    CBG (last 3)  No results for input(s): GLUCAP in the last 72 hours.  Scheduled Meds: . sodium chloride  10 mL/hr Intravenous Once  . atazanavir  300 mg Oral Q breakfast  . buPROPion  150 mg Oral Daily  . ciprofloxacin  400 mg Intravenous Q12H  . emtricitabine-tenofovir  1 tablet Oral Daily  . folic acid  1 mg Oral Daily  . [START ON 05/06/2014] methylPREDNISolone (SOLU-MEDROL) injection  60 mg Intravenous Once  . metronidazole  500 mg Intravenous Q8H  . ondansetron  4 mg Oral 4 times per day  . ritonavir  100 mg Oral Q breakfast  . sodium chloride  3 mL Intravenous Q12H  . traZODone  50 mg Oral QHS    Continuous Infusions: . sodium chloride 100 mL/hr at  05/05/14 3567    Past Medical History  Diagnosis Date  . HIV (human immunodeficiency virus infection)   . Depression   . Anemia   . HSV (herpes simplex virus) infection   . History of shingles   . AIN III (anal intraepithelial neoplasia III)   . Condyloma acuminatum in female   . History of chronic bronchitis   . History of esophagitis     CANDIDA  . Periodontitis, chronic     Past Surgical History  Procedure Laterality Date  . Dilation and curettage of uterus  2005    MISSED AB  . Examination under anesthesia N/A 09/23/2012    Procedure: EXAM UNDER ANESTHESIA;  Surgeon: Adin Hector, MD;  Location: [facility];  Service: General;  Laterality: N/A;  . Laser ablation condolamata N/A 09/23/2012    Procedure:  REMOVAL/ABLATION  ABLATION CONDOLAMATA WARTS;  Surgeon: Adin Hector, MD;  Location: [facility];  Service: General;  Laterality: N/A;    [name] A. Jimmye Norman, RD, LDN, CDE Pager: [phone] After hours Pager: [phone]

## 2014-05-05 NOTE — Progress Notes (Signed)
Notified Dr. Ethelene Hal, we are not allowed to have bair hugger, pt rectal temp 94, and will have to be transferred to a step- down unit in order to receive a bair hugger. Dr. Ethelene Hal placed ordered for pt to be transferred to step-down up, will make day RN aware.

## 2014-05-05 NOTE — H&P (Signed)
Date: 05/05/2014               Patient Name:  [name] MRN: [id number]  DOB: 12-Oct-1985 Age / Sex: 29 y.o., female   PCP: Thayer Headings, MD         Medical Service: Internal Medicine Teaching Service         Attending Physician: Dr. Annia Belt, MD    First Contact: Dr. Luan Moore Pager: [phone]  Second Contact: Dr. Natasha Bence Pager: [phone]       After Hours (After 5p/  First Contact Pager: [phone]  weekends / holidays): Second Contact Pager: [phone]   Chief Complaint: diarrhea  History of Present Illness: [name] is a 29 year old woman with well controlled HIV and anemia who presents with two months of productive cough, chest pain, SOB, and emesis.  She saw Dr Linus Salmons of ID on 05/01/14 for nausea, emesis, cough productive of white sputum, diarrhea and fever up to 101 with chills x 2 months. She also has green diarrhea 3x a day for these past two months with decreased appetite. She also notes bilateral LE numbness over this time. She says she has lost 10 lbs in the past two months. He started her on tamiflu and since feels dizzy. She was confused two nights ago and didn't know where she was while at home. Since this visit, she has developed fevers, chills and chest pain. She describes it as a tightness on her midsternum. Nothing makes it better or worse. She also has leg pain x 2 months without swelling. She denies any melena or hematochezia. She last had her period 1 month ago. She notes polyuria. They are not usually heavy. She denies SOB, palpitations, abdominal pain, dysuria, headache, loss of consciousness, gait trouble, rashes. No new changes to her medications. She smokes marijuana 2 blunts a day and says it makes her feel better.  In the ED, she received 2 L NS, morphine 4 mg iv once, zofran 4 mg iv, and acetaminophen 650 mg po.  Meds: Current Facility-Administered Medications  Medication Dose Route Frequency Provider Last Rate Last Dose  . 0.9 %  sodium  chloride infusion  10 mL/hr Intravenous Once [employer], PA-C      . 0.9 %  sodium chloride infusion   Intravenous Continuous Corky Sox, MD 100 mL/hr at 05/04/14 2019    . atazanavir (REYATAZ) capsule 300 mg  300 mg Oral Q breakfast Annia Belt, MD   300 mg at 05/04/14 2203  . buPROPion (WELLBUTRIN XL) 24 hr tablet 150 mg  150 mg Oral Daily Corky Sox, MD   150 mg at 05/04/14 2016  . emtricitabine-tenofovir (TRUVADA) 200-300 MG per tablet 1 tablet  1 tablet Oral Daily Corky Sox, MD   1 tablet at 16/94/50 3888  . folic acid (FOLVITE) tablet 1 mg  1 mg Oral Daily Marjan Rabbani, MD      . ibuprofen (ADVIL,MOTRIN) tablet 400 mg  400 mg Oral Q4H PRN Juluis Mire, MD   400 mg at 05/04/14 2350  . ondansetron (ZOFRAN) tablet 4 mg  4 mg Oral 4 times per day Kelby Aline, MD      . predniSONE (DELTASONE) tablet 60 mg  60 mg Oral Q breakfast Marjan Rabbani, MD      . ritonavir (NORVIR) tablet 100 mg  100 mg Oral Q breakfast Annia Belt, MD   100 mg at 05/04/14 2203  . sodium  chloride 0.9 % injection 3 mL  3 mL Intravenous Q12H Corky Sox, MD   3 mL at 05/04/14 2350  . traZODone (DESYREL) tablet 50 mg  50 mg Oral QHS Corky Sox, MD   50 mg at 05/04/14 2349    Allergies: Allergies as of 05/04/2014  . (No Known Allergies)   Past Medical History  Diagnosis Date  . HIV (human immunodeficiency virus infection)   . Depression   . Anemia   . HSV (herpes simplex virus) infection   . History of shingles   . AIN III (anal intraepithelial neoplasia III)   . Condyloma acuminatum in female   . History of chronic bronchitis   . History of esophagitis     CANDIDA  . Periodontitis, chronic    Past Surgical History  Procedure Laterality Date  . Dilation and curettage of uterus  2005    MISSED AB  . Examination under anesthesia N/A 09/23/2012    Procedure: EXAM UNDER ANESTHESIA;  Surgeon: Adin Hector, MD;  Location: [place];  Service: General;   Laterality: N/A;  . Laser ablation condolamata N/A 09/23/2012    Procedure: REMOVAL/ABLATION  ABLATION CONDOLAMATA WARTS;  Surgeon: Adin Hector, MD;  Location: [facility];  Service: General;  Laterality: N/A;   No family history on file. History   Social History  . Marital Status: Single    Spouse Name: N/A    Number of Children: N/A  . Years of Education: N/A   Occupational History  . Not on file.   Social History Main Topics  . Smoking status: Former Smoker -- 0.50 packs/day for 7 years    Types: Cigarettes    Start date: 03/19/2014  . Smokeless tobacco: Never Used  . Alcohol Use: No  . Drug Use: Yes    Special: Marijuana     Comment: 3 X/week  . Sexual Activity:    Partners: Male    Museum/gallery curator: None     Comment: pt. declined condoms   Other Topics Concern  . Not on file   Social History Narrative    Review of Systems: A comprehensive review of systems was negative except for: as described above per HPI  Physical Exam: Blood pressure 98/45, pulse 103, temperature 99.2 F (37.3 C), temperature source Oral, resp. rate 20, height '5\' 4"'  (1.626 m), weight 94 lb 11.2 oz (42.956 kg), last menstrual period 03/19/2014, SpO2 100 %.  Gen: A&O x 4, diaphoretic, well developed, thin HEENT: Atraumatic, PERRL, EOMI, sclerae anicteric, moist mucous membranes Heart: tachycardic but regular, normal S1 S2, no murmurs, rubs, or gallops Lungs: Clear to auscultation bilaterally, respirations unlabored Abd: Soft, non-tender, non-distended, + bowel sounds, splenomegaly ~ 6 cm below L rib margin Ext: No edema or cyanosis  Lab results: Basic Metabolic Panel:  Recent Labs  05/04/14 1410 05/04/14 1610  NA 129* 129*  K 4.4 4.2  CL 93* 93*  CO2 26  --   GLUCOSE 104* 92  BUN 8 10  CREATININE 0.82 0.80  CALCIUM 7.9*  --    Liver Function Tests:  Recent Labs  05/04/14 1600  AST 152*  ALT 85*  ALKPHOS 144*  BILITOT 2.9*  PROT 8.1  ALBUMIN  2.0*   CBC:  Recent Labs  05/04/14 1410 05/04/14 1600 05/04/14 1610  WBC 6.4 7.1  --   HGB 5.9* 5.3* 6.5*  HCT 18.8* 17.8* 19.0*  MCV 98.9 95.7  --   PLT 375  360  --    Anemia Panel:  Recent Labs  05/04/14 1600  VITAMINB12 1019*  FOLATE 18.5  FERRITIN 1930*  TIBC 167*  IRON 15*  RETICCTPCT 6.6*   Urinalysis:  Recent Labs  05/04/14 1626  COLORURINE AMBER*  LABSPEC 1.022  PHURINE 6.0  GLUCOSEU NEGATIVE  HGBUR NEGATIVE  BILIRUBINUR NEGATIVE  KETONESUR 15*  PROTEINUR 30*  UROBILINOGEN 2.0*  NITRITE NEGATIVE  LEUKOCYTESUR TRACE*  few bacteria, hyaline casts, amorphous urates/phosphates  Misc. Labs: Smear polychormasia present Direct Coombs positive Reticulocytes 6.6 LDH 209 (wnl) FOBC negative Type and screen warm autoantibody i-stat lactic acid 2.09 i-stat troponin 0.00  Imaging results:  Dg Chest 2 View  05/04/2014   CLINICAL DATA:  Chest pain and shortness of breath.  Fever.  EXAM: CHEST  2 VIEW  COMPARISON:  Two-view chest 06/14/2011  FINDINGS: The heart size is normal. The lungs are clear. Mild degenerative changes in the mid thoracic spine are stable.  IMPRESSION: Negative two view chest x-ray   Electronically Signed   By: Lawrence Santiago M.D.   On: 05/04/2014 14:50    Other results: EKG: unchanged from previous tracings 06/14/2011, normal sinus rhythm, sinus tachycardia.  Assessment & Plan by Problem: Active Problems:   SIRS (systemic inflammatory response syndrome)   Chest pain  #SIRS: Allison Mcconahy presented with temperature of 39C, tachycardia which 2 of 4 SIRS criteria. Infectious etiology is unclear as CXR negative, UA negative. While she has HIV, it was well controlled when last checked in 01/2014 with RNA <20 and CD4 470. She may have a colitis which explains her diarrhea, nausea, emesis. This may also be a systemic reaction to her hemolytic anemia (see below). She does have elevated transaminases. She was started on vancomycin zosyn but we will  change to cipro and flagyl to cover for enteric organisms given clinical history. -cipro and flagyl per pharmacy -NS @ 100 cc/hr -zofran 4 mg po q6hprn -BCx x 2 -hepatitis panel -GI pathogen panel -stool O&P, leukocytes, culture, CMV, microsporidium, lactoferrin -C diff -lipase -abd u/s complete -O2 w sat >92% -cardiac monitoring  #Hemolytic Anemia: Allison. Brenes presented with a hemoglobin of 5.9. Her anemia work-up so far reveals it is hemolytic as it is Direct Coombs positive and type and screen noted warm autoantibody which is IgG. Her indirect bilirubin is 2.5. There were no schistocytes on smear so this is likely extravascular in the spleen. We gave verbal order for transfusion of 2 U PRBC of the least incompatible blood as none were compatible. Treatment is steroids. Of note, her hemoglobin was ~13 until one year ago. On 05/06/13 she had an ectopic pregnancy which required methotrexate. Will not test for G6DP as this is falsely negative in acute hemolytic event. She will need steroid treatment. -Haptoglobin -ANA -ESR -prednisone 60 mg po (1.5 mg/kg) daily -folic acid 1 mg po daily -cont to monitor  #HIV: Followed by Dr Linus Salmons of ID. Last labs 01/25/14 with HIV RNA <20 and CD4 470. Home meds are atazanavir 300 mg po daily, emtricitabine-tenofovir 200-300 mg po daily, ritonavir 100 mg po daily. It does not appear any of these have hemolytic anemia as a side effect. -CD4 count -continue atazanavir 300 mg po daily, emtricitabine-tenofovir 200-300 mg po daily, ritonavir 100 mg po daily  #Hyponatremia: Presenting Na 129 likely due to dehydration in the setting of poor po intake. -NS @ 100 cc/hr -cont to monitor  #Chest Pain: ACS unlikely given age, troponin negative, EKG with no ischemic changes.  CXR clear. -cardiac monitoring  #Depression: On bupropion 150 mg po daily and trazodone 50 mg po qhs at home -continue bupropion 150 mg po daily and trazodone 50 mg po qhs   #Diet: clear  liquid  #DVT PPx: SCDs  #Code: Full  Dispo: Disposition is deferred at this time, awaiting improvement of current medical problems. Anticipated discharge in approximately 2 day(s).   The patient does have a current PCP Thayer Headings, MD) and does need an [facility] hospital follow-up appointment after discharge.  The patient does not know have transportation limitations that hinder transportation to clinic appointments.  Signed: Kelby Aline, MD 05/05/2014, 3:08 AM

## 2014-05-05 NOTE — Progress Notes (Signed)
[name] is crying because she wants to take a shower.  She is refusing a rectal temp at this time and is bathing herself at the sink.  Alert and oriented, ambulating safely and does not report feeling cold or unwell at this time.  Dr. Sherrine Maples paged to request an order for shower and to reevaluate the need for enteric precautions in light of her long standing history of chronic loose stool and negative c-diff screen.

## 2014-05-05 NOTE — Progress Notes (Signed)
Pt refuses to be reswabbed for flu. Previous swab was not labeled in ED per lab.

## 2014-05-06 ENCOUNTER — Inpatient Hospital Stay (HOSPITAL_COMMUNITY): Payer: Self-pay

## 2014-05-06 ENCOUNTER — Encounter (HOSPITAL_COMMUNITY): Payer: Self-pay | Admitting: Radiology

## 2014-05-06 LAB — URINE CULTURE
COLONY COUNT: NO GROWTH
Culture: NO GROWTH

## 2014-05-06 LAB — CBC WITH DIFFERENTIAL/PLATELET
BASOS ABS: 0 10*3/uL (ref 0.0–0.1)
BASOS PCT: 0 % (ref 0–1)
Eosinophils Absolute: 0 10*3/uL (ref 0.0–0.7)
Eosinophils Relative: 0 % (ref 0–5)
HCT: 26.8 % — ABNORMAL LOW (ref 36.0–46.0)
Hemoglobin: 8.2 g/dL — ABNORMAL LOW (ref 12.0–15.0)
LYMPHS ABS: 0.2 10*3/uL — AB (ref 0.7–4.0)
Lymphocytes Relative: 4 % — ABNORMAL LOW (ref 12–46)
MCH: 26.9 pg (ref 26.0–34.0)
MCHC: 30.6 g/dL (ref 30.0–36.0)
MCV: 87.9 fL (ref 78.0–100.0)
MONO ABS: 0.5 10*3/uL (ref 0.1–1.0)
MONOS PCT: 8 % (ref 3–12)
NEUTROS ABS: 5.5 10*3/uL (ref 1.7–7.7)
Neutrophils Relative %: 88 % — ABNORMAL HIGH (ref 43–77)
Platelets: 301 10*3/uL (ref 150–400)
RBC: 3.05 MIL/uL — AB (ref 3.87–5.11)
RDW: 21.8 % — ABNORMAL HIGH (ref 11.5–15.5)
WBC: 6.2 10*3/uL (ref 4.0–10.5)

## 2014-05-06 LAB — HEPATITIS PANEL, ACUTE
HCV Ab: NEGATIVE
HEP B C IGM: NONREACTIVE
HEP B S AG: NEGATIVE
Hep A IgM: NONREACTIVE

## 2014-05-06 LAB — BASIC METABOLIC PANEL
Anion gap: 7 (ref 5–15)
BUN: 9 mg/dL (ref 6–23)
CALCIUM: 7.9 mg/dL — AB (ref 8.4–10.5)
CO2: 24 mmol/L (ref 19–32)
Chloride: 104 mEq/L (ref 96–112)
Creatinine, Ser: 0.71 mg/dL (ref 0.50–1.10)
GFR calc Af Amer: >90 mL/min (ref >90)
Glucose, Bld: 137 mg/dL — ABNORMAL HIGH (ref 70–99)
Potassium: 3.4 mmol/L — ABNORMAL LOW (ref 3.5–5.1)
Sodium: 135 mmol/L (ref 135–145)

## 2014-05-06 LAB — RETICULOCYTES
RBC.: 3.05 MIL/uL — AB (ref 3.87–5.11)
Retic Count, Absolute: 119 10*3/uL (ref 19.0–186.0)
Retic Ct Pct: 3.9 % — ABNORMAL HIGH (ref 0.4–3.1)

## 2014-05-06 LAB — LACTATE DEHYDROGENASE: LDH: 178 U/L (ref 94–250)

## 2014-05-06 MED ORDER — PREDNISONE 50 MG PO TABS
50.0000 mg | ORAL_TABLET | Freq: Every day | ORAL | Status: DC
  Administered 2014-05-07: 50 mg via ORAL
  Filled 2014-05-06 (×2): qty 1

## 2014-05-06 MED ORDER — IOHEXOL 300 MG/ML  SOLN
25.0000 mL | INTRAMUSCULAR | Status: AC
  Administered 2014-05-06 (×2): 25 mL via ORAL

## 2014-05-06 MED ORDER — IOHEXOL 300 MG/ML  SOLN
80.0000 mL | Freq: Once | INTRAMUSCULAR | Status: AC | PRN
  Administered 2014-05-06: 80 mL via INTRAVENOUS

## 2014-05-06 NOTE — Progress Notes (Signed)
[name] says that she will wait to take a shower.  Dr Sherrine Maples wants to wait until the morning to decide on discontinuing enteric precautions.

## 2014-05-06 NOTE — Progress Notes (Signed)
Subjective: No complaints today. No nausea, vomiting, diarrhea, or cough. Wants to go home.   Objective: Vital signs in last 24 hours: Filed Vitals:   05/05/14 1415 05/05/14 2039 05/06/14 0125 05/06/14 0551  BP:  102/66 98/60 90/52   Pulse:  71 72 61  Temp: 95.3 F (35.2 C) 96.1 F (35.6 C) 95.4 F (35.2 C) 95.2 F (35.1 C)  TempSrc: Rectal Rectal Rectal Rectal  Resp:  18 18 16   Height:      Weight:      SpO2:  100% 100% 100%   Weight change:   Intake/Output Summary (Last 24 hours) at 05/06/14 1317 Last data filed at 05/06/14 1020  Gross per 24 hour  Intake   1680 ml  Output    650 ml  Net   1030 ml   Physical Exam: General: Thin-appearing AA female, alert, cooperative, NAD. HEENT: PERRL, EOMI. Moist mucus membranes Neck: Full range of motion without pain, supple, no lymphadenopathy or carotid bruits Lungs: Clear to ascultation bilaterally, normal work of respiration, no wheezes, rales, rhonchi Heart: RRR, no murmurs, gallops, or rubs Abdomen: Soft, non-tender, non-distended, BS + Extremities: No cyanosis, clubbing, or edema Neurologic: Alert & oriented X3, cranial nerves II-XII intact, strength grossly intact, sensation intact to light touch    Lab Results: Basic Metabolic Panel:  Recent Labs Lab 05/05/14 1630 05/06/14 0525  NA 132* 135  K 3.9 3.4*  CL 102 104  CO2 24 24  GLUCOSE 133* 137*  BUN 7 9  CREATININE 0.59 0.71  CALCIUM 7.4* 7.9*  MG 2.4  --    Liver Function Tests:  Recent Labs Lab 05/04/14 1600  AST 152*  ALT 85*  ALKPHOS 144*  BILITOT 2.9*  PROT 8.1  ALBUMIN 2.0*   CBC:  Recent Labs Lab 05/05/14 1630 05/06/14 0525  WBC 5.3 6.2  NEUTROABS  --  5.5  HGB 8.2* 8.2*  HCT 26.4* 26.8*  MCV 87.7 87.9  PLT 290 301   Thyroid Function Tests:  Recent Labs Lab 05/05/14 1630  TSH 2.544   Anemia Panel:  Recent Labs Lab 05/04/14 1600 05/06/14 0525  VITAMINB12 1019*  --   FOLATE 18.5  --   FERRITIN 1930*  --   TIBC 167*   --   IRON 15*  --   RETICCTPCT 6.6* 3.9*   Urinalysis:  Recent Labs Lab 05/04/14 1626  COLORURINE AMBER*  LABSPEC 1.022  PHURINE 6.0  GLUCOSEU NEGATIVE  HGBUR NEGATIVE  BILIRUBINUR NEGATIVE  KETONESUR 15*  PROTEINUR 30*  UROBILINOGEN 2.0*  NITRITE NEGATIVE  LEUKOCYTESUR TRACE*   Micro Results: Recent Results (from the past 240 hour(s))  Blood culture (routine x 2)     Status: None (Preliminary result)   Collection Time: 05/04/14  3:50 PM  Result Value Ref Range Status   Specimen Description BLOOD RIGHT ARM  Final   Special Requests BOTTLES DRAWN AEROBIC AND ANAEROBIC 10CC  Final   Culture   Final           BLOOD CULTURE RECEIVED NO GROWTH TO DATE CULTURE WILL BE HELD FOR 5 DAYS BEFORE ISSUING A FINAL NEGATIVE REPORT Performed at [facility]    Report Status PENDING  Incomplete  Blood culture (routine x 2)     Status: None (Preliminary result)   Collection Time: 05/04/14  4:00 PM  Result Value Ref Range Status   Specimen Description BLOOD LEFT ARM  Final   Special Requests BOTTLES DRAWN AEROBIC AND ANAEROBIC 5CC  Final  Culture   Final           BLOOD CULTURE RECEIVED NO GROWTH TO DATE CULTURE WILL BE HELD FOR 5 DAYS BEFORE ISSUING A FINAL NEGATIVE REPORT Performed at [facility]    Report Status PENDING  Incomplete  Urine culture     Status: None   Collection Time: 05/04/14  4:26 PM  Result Value Ref Range Status   Specimen Description URINE, RANDOM  Final   Special Requests NONE  Final   Colony Count NO GROWTH Performed at [facility]   Final   Culture NO GROWTH Performed at [facility]   Final   Report Status 05/06/2014 FINAL  Final  Clostridium Difficile by PCR     Status: None   Collection Time: 05/04/14  4:28 PM  Result Value Ref Range Status   C difficile by pcr NEGATIVE NEGATIVE Final  MRSA PCR Screening     Status: None   Collection Time: 05/05/14  3:35 AM  Result Value Ref Range Status   MRSA by PCR NEGATIVE  NEGATIVE Final    Comment:        The GeneXpert MRSA Assay (FDA approved for NASAL specimens only), is one component of a comprehensive MRSA colonization surveillance program. It is not intended to diagnose MRSA infection nor to guide or monitor treatment for MRSA infections.    Studies/Results: Dg Chest 2 View  05/04/2014   CLINICAL DATA:  Chest pain and shortness of breath.  Fever.  EXAM: CHEST  2 VIEW  COMPARISON:  Two-view chest 06/14/2011  FINDINGS: The heart size is normal. The lungs are clear. Mild degenerative changes in the mid thoracic spine are stable.  IMPRESSION: Negative two view chest x-ray   Electronically Signed   By: Lawrence Santiago M.D.   On: 05/04/2014 14:50   US Abdomen Complete  05/05/2014   CLINICAL DATA:  Patient with history of HIV and anemia. Nausea, vomiting and diarrhea for 2 months.  EXAM: ULTRASOUND ABDOMEN COMPLETE  COMPARISON:  None.  FINDINGS: Gallbladder: The gallbladder is contracted. The wall measures up to 2.5 mm. Negative sonographic Murphy's sign. No pericholecystic fluid.  Common bile duct: Diameter: 2 mm  Liver: No focal lesion identified. Within normal limits in parenchymal echogenicity.  IVC: No abnormality visualized.  Pancreas: Visualized portion unremarkable.  Spleen: Enlarged and heterogeneous measuring up to 14.7 cm.  Right Kidney: Length: 11.4 cm. Diffusely increased cortical echogenicity. No hydronephrosis.  Left Kidney: Length: 11.4 cm. Diffusely increased cortical echogenicity. Trace pelviectasis.  Abdominal aorta: No aneurysm visualized.  Other findings: Within the region of the pancreas there is a 4.4 x 1.9 x 3.0 cm hypoechoic mass.  IMPRESSION: 1. There is a 4.4 cm hypoechoic mass within the region of the pancreas, nonspecific however favored to represent lymphadenopathy. 2. Splenomegaly.  Markedly heterogeneous splenic echotexture. 3. The gallbladder is contracted. There are no secondary signs to suggest acute cholecystitis including  pericholecystic or sonographic Murphy's sign. 4. Increased renal cortical echogenicity as can be seen with chronic medical renal disease. Trace left pelviectasis. 5. Recommend correlation with CT given the peripancreatic masses and heterogeneity of the spleen. These results will be called to the ordering clinician or representative by the Radiologist Assistant, and communication documented in the PACS or zVision Dashboard.   Electronically Signed   By: Lovey Newcomer M.D.   On: 05/05/2014 15:50   Medications: I have reviewed the patient's current medications. Scheduled Meds: . sodium chloride  10 mL/hr Intravenous Once  .  atazanavir  300 mg Oral Q breakfast  . buPROPion  150 mg Oral Daily  . ciprofloxacin  400 mg Intravenous Q12H  . emtricitabine-tenofovir  1 tablet Oral Daily  . feeding supplement (RESOURCE BREEZE)  1 Container Oral TID BM  . folic acid  1 mg Oral Daily  . metronidazole  500 mg Intravenous Q8H  . ondansetron  4 mg Oral 4 times per day  . [START ON 05/07/2014] predniSONE  50 mg Oral Q breakfast  . ritonavir  100 mg Oral Q breakfast  . sodium chloride  3 mL Intravenous Q12H  . traZODone  50 mg Oral QHS   Continuous Infusions:   PRN Meds:.acetaminophen   Assessment/Plan: 29 y/o F w/ PMHx of HIV, anemia, and depression, admitted for suspected acute hemolytic anemia w/ Hb of 5.5.  Hemolytic Anemia: Now s/p 2 units PRBC's, Hb 8.2 this AM, no change from yesterday s/p transfusion. Received Prednisone po yesterday, given SoluMedrol 60 mg IV today. LDH continues to be normal, repeat retics still elevated at 3.9%. Haptoglobin pending. Monospot negative, EBV, mycoplasma, RPR still pending. ANA also pending. Still w/ polychromasia on smear. Atypical monos seen on smear yesterday as well as polychromasia and spherocytes. No schistocytes. Splenomegaly on abdominal US.  -Continue on Prednisone 50 mg daily -Folic acid 1 mg po qd -Repeat CBC in AM  N/V/D: Symptoms resolved. Says she is  eating well, no nausea. Still hypothermic this AM, temp 95.2. Patient feels afebrile and warm on exam. C. Diff negative, stool culture and ova/parasite pending. No leukocytosis. Abdominal US shows hypoechoic mass in the pancreatic region thought to be lymphadenopathy.  -CT abdomen/pelvis w/ contrast -Continue Ciprofloxacin + Flagyl for now; will discontinue if no sign of colitis.  -Continue regular diet -Blood cultures pending -Zofran 4 mg q6h prn  HIV: Followed by Dr Linus Salmons of ID.  -CD4 count pending -Continue atazanavir 300 mg po daily, emtricitabine-tenofovir 200-300 mg po daily, ritonavir 100 mg po daily -Follow up [facility] on discharge.   Hyponatremia: Resolved.  -Repeat BMP in AM  Elevated LFTs: AST of 152 and ALT of 85 with alkaline phosphatase of 144. Hepatitis panel negative.  -Repeat LFT's in AM  Chest Pain: Resolved. No further cough.  -Discontinue cardiac monitoring  Depression: Stable.  -Continue bupropion 150 mg po daily and trazodone 50 mg po qhs   Diet: Regular diet.   DVT PPx: SCDs  Code: Full  Dispo: Disposition is deferred at this time, awaiting improvement of current medical problems. Anticipated discharge in approximately 2 day(s).   The patient does have a current PCP Thayer Headings, MD) and does need an [facility] hospital follow-up appointment after discharge.  The patient does not know have transportation limitations that hinder transportation to clinic appointments.   .Services Needed at time of discharge: Y = Yes, Blank = No PT:   OT:   RN:   Equipment:   Other:     LOS: 2 days   Corky Sox, MD 05/06/2014, 1:17 PM

## 2014-05-07 ENCOUNTER — Other Ambulatory Visit: Payer: Self-pay | Admitting: Hematology and Oncology

## 2014-05-07 ENCOUNTER — Inpatient Hospital Stay (HOSPITAL_COMMUNITY): Payer: MEDICAID

## 2014-05-07 ENCOUNTER — Other Ambulatory Visit (INDEPENDENT_AMBULATORY_CARE_PROVIDER_SITE_OTHER): Payer: Self-pay | Admitting: General Surgery

## 2014-05-07 ENCOUNTER — Encounter (HOSPITAL_COMMUNITY): Payer: Self-pay | Admitting: General Surgery

## 2014-05-07 DIAGNOSIS — B20 Human immunodeficiency virus [HIV] disease: Secondary | ICD-10-CM | POA: Insufficient documentation

## 2014-05-07 DIAGNOSIS — D5911 Warm autoimmune hemolytic anemia: Secondary | ICD-10-CM

## 2014-05-07 DIAGNOSIS — D591 Other autoimmune hemolytic anemias: Principal | ICD-10-CM

## 2014-05-07 DIAGNOSIS — R591 Generalized enlarged lymph nodes: Secondary | ICD-10-CM | POA: Insufficient documentation

## 2014-05-07 LAB — CBC WITH DIFFERENTIAL/PLATELET
BASOS PCT: 0 % (ref 0–1)
Basophils Absolute: 0 10*3/uL (ref 0.0–0.1)
Eosinophils Absolute: 0 10*3/uL (ref 0.0–0.7)
Eosinophils Relative: 0 % (ref 0–5)
HEMATOCRIT: 24.4 % — AB (ref 36.0–46.0)
HEMOGLOBIN: 7.5 g/dL — AB (ref 12.0–15.0)
Lymphocytes Relative: 2 % — ABNORMAL LOW (ref 12–46)
Lymphs Abs: 0.2 10*3/uL — ABNORMAL LOW (ref 0.7–4.0)
MCH: 27.8 pg (ref 26.0–34.0)
MCHC: 30.7 g/dL (ref 30.0–36.0)
MCV: 90.4 fL (ref 78.0–100.0)
MONO ABS: 0.6 10*3/uL (ref 0.1–1.0)
Monocytes Relative: 7 % (ref 3–12)
Neutro Abs: 7.4 10*3/uL (ref 1.7–7.7)
Neutrophils Relative %: 91 % — ABNORMAL HIGH (ref 43–77)
Platelets: 308 10*3/uL (ref 150–400)
RBC: 2.7 MIL/uL — ABNORMAL LOW (ref 3.87–5.11)
RDW: 22.7 % — AB (ref 11.5–15.5)
WBC: 8.2 10*3/uL (ref 4.0–10.5)

## 2014-05-07 LAB — COMPREHENSIVE METABOLIC PANEL
ALK PHOS: 102 U/L (ref 39–117)
ALT: 39 U/L — ABNORMAL HIGH (ref 0–35)
AST: 24 U/L (ref 0–37)
Albumin: 1.6 g/dL — ABNORMAL LOW (ref 3.5–5.2)
Anion gap: 7 (ref 5–15)
BILIRUBIN TOTAL: 2.2 mg/dL — AB (ref 0.3–1.2)
BUN: 9 mg/dL (ref 6–23)
CO2: 22 mmol/L (ref 19–32)
Calcium: 7.7 mg/dL — ABNORMAL LOW (ref 8.4–10.5)
Chloride: 106 mEq/L (ref 96–112)
Creatinine, Ser: 0.79 mg/dL (ref 0.50–1.10)
GFR calc Af Amer: >90 mL/min (ref >90)
GFR calc non Af Amer: >90 mL/min (ref >90)
Glucose, Bld: 179 mg/dL — ABNORMAL HIGH (ref 70–99)
POTASSIUM: 3.5 mmol/L (ref 3.5–5.1)
Sodium: 135 mmol/L (ref 135–145)
Total Protein: 6.2 g/dL (ref 6.0–8.3)

## 2014-05-07 LAB — CMV ANTIBODY, IGG (EIA): CMV Ab - IgG: >10 U/mL — ABNORMAL HIGH (ref <0.60)

## 2014-05-07 LAB — T-HELPER CELLS (CD4) COUNT (NOT AT ARMC)
CD4 T CELL ABS: 120 /uL — AB (ref 400–2700)
CD4 T CELL HELPER: 33 % (ref 33–55)

## 2014-05-07 LAB — EPSTEIN-BARR VIRUS VCA ANTIBODY PANEL
EBV EA IgG: 15.4 U/mL — ABNORMAL HIGH (ref <9.0)
EBV NA IgG: 232 U/mL — ABNORMAL HIGH (ref <18.0)
EBV VCA IgM: <10 U/mL (ref <36.0)

## 2014-05-07 LAB — ANA: Anti Nuclear Antibody(ANA): POSITIVE — AB

## 2014-05-07 LAB — ANTI-NUCLEAR AB-TITER (ANA TITER): ANA Titer 1: NEGATIVE

## 2014-05-07 LAB — PROTIME-INR
INR: 1.48 (ref 0.00–1.49)
Prothrombin Time: 18.1 seconds — ABNORMAL HIGH (ref 11.6–15.2)

## 2014-05-07 LAB — URIC ACID: Uric Acid, Serum: 4.2 mg/dL (ref 2.4–7.0)

## 2014-05-07 LAB — CMV IGM: CMV IgM: <8 AU/mL (ref <30.00)

## 2014-05-07 MED ORDER — PREDNISONE 50 MG PO TABS: 50.0000 mg | ORAL_TABLET | Freq: Every day | ORAL | Status: DC

## 2014-05-07 MED ORDER — FOLIC ACID 1 MG PO TABS: 1.0000 mg | ORAL_TABLET | Freq: Every day | ORAL | Status: DC

## 2014-05-07 MED ORDER — IOHEXOL 300 MG/ML  SOLN
70.0000 mL | Freq: Once | INTRAMUSCULAR | Status: AC | PRN
  Administered 2014-05-07: 70 mL via INTRAVENOUS

## 2014-05-07 NOTE — Progress Notes (Signed)
Subjective:  Patient states that she has been doing well overnight. Patient has no complaints this morning patient really wants to go home despite hearing news that she may have lymphoma versus an infection.  Objective: Vital signs in last 24 hours: Filed Vitals:   05/06/14 1449 05/06/14 2235 05/07/14 0559 05/07/14 0645  BP: 103/63 100/65 98/58   Pulse: 68 57 67   Temp: 97.5 F (36.4 C) 95.3 F (35.2 C) 94.6 F (34.8 C)   TempSrc: Oral Rectal Rectal   Resp: '14 18 18   ' Height:      Weight:      SpO2: 98% 100% 89% 100%   Weight change:   Intake/Output Summary (Last 24 hours) at 05/07/14 0746 Last data filed at 05/07/14 0402  Gross per 24 hour  Intake   2444 ml  Output      0 ml  Net   2444 ml   Physical Exam: General: Thin-appearing AA female, alert, cooperative, NAD. HEENT: PERRL, EOMI. Moist mucus membranes Neck: Full range of motion without pain, supple, no lymphadenopathy or carotid bruits Lungs: Clear to ascultation bilaterally, normal work of respiration, no wheezes, rales, rhonchi Heart: RRR, no murmurs, gallops, or rubs Abdomen: Soft, non-tender, non-distended, BS + Extremities: No cyanosis, clubbing, or edema Neurologic: Alert & oriented X3, cranial nerves II-XII intact, strength grossly intact, sensation intact to light touch  Lab Results: Basic Metabolic Panel:  Recent Labs Lab 05/05/14 1630 05/06/14 0525 05/07/14 0455  NA 132* 135 135  K 3.9 3.4* 3.5  CL 102 104 106  CO2 '24 24 22  ' GLUCOSE 133* 137* 179*  BUN '7 9 9  ' CREATININE 0.59 0.71 0.79  CALCIUM 7.4* 7.9* 7.7*  MG 2.4  --   --    Liver Function Tests:  Recent Labs Lab 05/04/14 1600 05/07/14 0455  AST 152* 24  ALT 85* 39*  ALKPHOS 144* 102  BILITOT 2.9* 2.2*  PROT 8.1 6.2  ALBUMIN 2.0* 1.6*   CBC:  Recent Labs Lab 05/06/14 0525 05/07/14 0456  WBC 6.2 8.2  NEUTROABS 5.5 7.4  HGB 8.2* 7.5*  HCT 26.8* 24.4*  MCV 87.9 90.4  PLT 301 308   Thyroid Function Tests:  Recent  Labs Lab 05/05/14 1630  TSH 2.544   Anemia Panel:  Recent Labs Lab 05/04/14 1600 05/06/14 0525  VITAMINB12 1019*  --   FOLATE 18.5  --   FERRITIN 1930*  --   TIBC 167*  --   IRON 15*  --   RETICCTPCT 6.6* 3.9*   Urinalysis:  Recent Labs Lab 05/04/14 1626  COLORURINE AMBER*  LABSPEC 1.022  PHURINE 6.0  GLUCOSEU NEGATIVE  HGBUR NEGATIVE  BILIRUBINUR NEGATIVE  KETONESUR 15*  PROTEINUR 30*  UROBILINOGEN 2.0*  NITRITE NEGATIVE  LEUKOCYTESUR TRACE*   Micro Results: Recent Results (from the past 240 hour(s))  Blood culture (routine x 2)     Status: None (Preliminary result)   Collection Time: 05/04/14  3:50 PM  Result Value Ref Range Status   Specimen Description BLOOD RIGHT ARM  Final   Special Requests BOTTLES DRAWN AEROBIC AND ANAEROBIC 10CC  Final   Culture   Final           BLOOD CULTURE RECEIVED NO GROWTH TO DATE CULTURE WILL BE HELD FOR 5 DAYS BEFORE ISSUING A FINAL NEGATIVE REPORT Performed at [facility]    Report Status PENDING  Incomplete  Blood culture (routine x 2)     Status: None (Preliminary result)  Collection Time: 05/04/14  4:00 PM  Result Value Ref Range Status   Specimen Description BLOOD LEFT ARM  Final   Special Requests BOTTLES DRAWN AEROBIC AND ANAEROBIC 5CC  Final   Culture   Final           BLOOD CULTURE RECEIVED NO GROWTH TO DATE CULTURE WILL BE HELD FOR 5 DAYS BEFORE ISSUING A FINAL NEGATIVE REPORT Performed at [facility]    Report Status PENDING  Incomplete  Urine culture     Status: None   Collection Time: 05/04/14  4:26 PM  Result Value Ref Range Status   Specimen Description URINE, RANDOM  Final   Special Requests NONE  Final   Colony Count NO GROWTH Performed at [facility]   Final   Culture NO GROWTH Performed at [facility]   Final   Report Status 05/06/2014 FINAL  Final  Clostridium Difficile by PCR     Status: None   Collection Time: 05/04/14  4:28 PM  Result Value Ref Range  Status   C difficile by pcr NEGATIVE NEGATIVE Final  MRSA PCR Screening     Status: None   Collection Time: 05/05/14  3:35 AM  Result Value Ref Range Status   MRSA by PCR NEGATIVE NEGATIVE Final    Comment:        The GeneXpert MRSA Assay (FDA approved for NASAL specimens only), is one component of a comprehensive MRSA colonization surveillance program. It is not intended to diagnose MRSA infection nor to guide or monitor treatment for MRSA infections.    Studies/Results: US Abdomen Complete  05/05/2014   CLINICAL DATA:  Patient with history of HIV and anemia. Nausea, vomiting and diarrhea for 2 months.  EXAM: ULTRASOUND ABDOMEN COMPLETE  COMPARISON:  None.  FINDINGS: Gallbladder: The gallbladder is contracted. The wall measures up to 2.5 mm. Negative sonographic Murphy's sign. No pericholecystic fluid.  Common bile duct: Diameter: 2 mm  Liver: No focal lesion identified. Within normal limits in parenchymal echogenicity.  IVC: No abnormality visualized.  Pancreas: Visualized portion unremarkable.  Spleen: Enlarged and heterogeneous measuring up to 14.7 cm.  Right Kidney: Length: 11.4 cm. Diffusely increased cortical echogenicity. No hydronephrosis.  Left Kidney: Length: 11.4 cm. Diffusely increased cortical echogenicity. Trace pelviectasis.  Abdominal aorta: No aneurysm visualized.  Other findings: Within the region of the pancreas there is a 4.4 x 1.9 x 3.0 cm hypoechoic mass.  IMPRESSION: 1. There is a 4.4 cm hypoechoic mass within the region of the pancreas, nonspecific however favored to represent lymphadenopathy. 2. Splenomegaly.  Markedly heterogeneous splenic echotexture. 3. The gallbladder is contracted. There are no secondary signs to suggest acute cholecystitis including pericholecystic or sonographic Murphy's sign. 4. Increased renal cortical echogenicity as can be seen with chronic medical renal disease. Trace left pelviectasis. 5. Recommend correlation with CT given the peripancreatic  masses and heterogeneity of the spleen. These results will be called to the ordering clinician or representative by the Radiologist Assistant, and communication documented in the PACS or zVision Dashboard.   Electronically Signed   By: Lovey Newcomer M.D.   On: 05/05/2014 15:50   Ct Abdomen Pelvis W Contrast  05/06/2014   CLINICAL DATA:  Fever. Shortness of breath. HIV-positive. Anemia. Epigastric pain.  EXAM: CT ABDOMEN AND PELVIS WITH CONTRAST  TECHNIQUE: Multidetector CT imaging of the abdomen and pelvis was performed using the standard protocol following bolus administration of intravenous contrast.  CONTRAST:  1 OMNIPAQUE IOHEXOL 300 MG/ML SOLN, 24m  OMNIPAQUE IOHEXOL 300 MG/ML SOLN  COMPARISON:  05/05/2014  FINDINGS: Lower chest: Dependent subsegmental atelectasis or scarring in both lower lobes.  Hepatobiliary: Unremarkable  Pancreas: Unremarkable  Spleen: Abnormal hypodense micronodularity diffusely throughout the spleen. Splenic volume 610 cc compatible with splenomegaly. Splenic vein patent.  Adrenals/Urinary Tract: Unremarkable  Stomach/Bowel: Unremarkable  Vascular/Lymphatic: Mesenteric and retroperitoneal pathologic adenopathy. The confluence of the extensive adenopathy makes it difficult to measure, but an index left upper quadrant node at the level of the kidneys along the mesenteric measures 2.9 cm in short axis on image 35 series 2. A left-sided retroperitoneal lymph node measures 1.9 cm in short axis on image 53 series 2. The adenopathy involves the upper pelvis but not the lower pelvis.  Reproductive: Unremarkable  Other: Extensive mesenteric and subcutaneous edema diffusely compatible with third spacing of fluid, without a significant degree of ascites.  Musculoskeletal: Unremarkable  IMPRESSION: 1. Considerable splenomegaly with micronodular splenic hypodense pattern along with extensive retroperitoneal and mesenteric adenopathy. Appearance favors lymphoma. Splenic microabscesses, for example in  the setting of immunodeficiency-related fungal disease, are a differential diagnostic consideration. 2. Diffuse subcutaneous and mesenteric edema compatible with third spacing of fluid.   Electronically Signed   By: Sherryl Barters M.D.   On: 05/06/2014 20:22   Medications: I have reviewed the patient's current medications. Scheduled Meds: . sodium chloride  10 mL/hr Intravenous Once  . atazanavir  300 mg Oral Q breakfast  . buPROPion  150 mg Oral Daily  . emtricitabine-tenofovir  1 tablet Oral Daily  . feeding supplement (RESOURCE BREEZE)  1 Container Oral TID BM  . folic acid  1 mg Oral Daily  . ondansetron  4 mg Oral 4 times per day  . predniSONE  50 mg Oral Q breakfast  . ritonavir  100 mg Oral Q breakfast  . sodium chloride  3 mL Intravenous Q12H  . traZODone  50 mg Oral QHS   Continuous Infusions:   PRN Meds:.acetaminophen   Assessment/Plan: 29 y/o F w/ PMHx of HIV, anemia, and depression, admitted for suspected acute hemolytic anemia w/ Hb of 5.5.  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 Coombs positive with warm agglutinins, now with CT evidence splenomegaly along with mesenteric and retroperitoneal lymphadenopathy and history of weight loss, concerning for lymphoma. However, LDH wnl and haptoglobin still pending. Atypical monos seen on smear as well as polychromasia and spherocytes. No schistocytes. ANA positive which can be seen in the setting of a lymphoproliferative or paraneoplastic setting. -Continue on Prednisone 50 mg daily -Folic acid 1 mg po qd -Uric acid -General surgery consult for excisional biopsy, which is necessary as opposed to needle biopsy, given suspicion for lymphoma.  -Patient to follow with Dr. Alvy Bimler. Patient will also need a PET/CT, echocardiogram, bone marrow biopsy with flow cytometry, pulmonary function tests as an outpatient.  HIV: Followed by Dr Linus Salmons of [facility]. Last CD4  count of 470 from October 2015. -Repeat CD4 count pending. -Continue atazanavir 300 mg po daily, emtricitabine-tenofovir 200-300 mg po daily, ritonavir 100 mg po daily -Follow up [facility] on discharge.   ========= Resolved/stable Hypokalemia: Supplemented.   Hypothermia: Patient temperature is still running slightly low at times more than 35C. Patient is asymptomatic from this perspective. Patient has no chills or subjective fevers.  Elevated LFTs: Initially elevated with AST of 152 and ALT of 85 with alkaline phosphatase of 144, now trended down to around wnl. Hepatitis panel  negative.  Nausea/vomiting/diarrhea: Symptoms have stabilized. Patient was treated with a couple days of metronidazole and ciprofloxacin. No evidence of colitis of abdominal CT, so antibiotics were discontinued.  Chest Pain: Resolved. No further cough.  -Discontinue cardiac monitoring  Depression: Stable.  -Continue bupropion 150 mg po daily and trazodone 50 mg po qhs =======  Diet: Regular diet.   DVT PPx: SCDs  Code: Full  Dispo: Disposition is deferred at this time, awaiting improvement of current medical problems. Anticipated discharge in approximately 0-1 day(s).  -Will follow up in our clinic January 19 at 3:15 PM.   The patient does have a current PCP Thayer Headings, MD) and does need an [facility] hospital follow-up appointment after discharge.  The patient does not know have transportation limitations that hinder transportation to clinic appointments.   .Services Needed at time of discharge: Y = Yes, Blank = No PT:   OT:   RN:   Equipment:   Other:     LOS: 3 days   Luan Moore, MD 05/07/2014, 7:46 AM

## 2014-05-07 NOTE — Progress Notes (Signed)
Patient requesting to shower. Paged Dr. Ethelene Hal . Informed MD that patient is without complaint at this time. Ethelene Hal, MD  stated ok for patient to shower tonight .

## 2014-05-07 NOTE — Progress Notes (Signed)
Paged Dr. Ethelene Hal. Patient 95.3 rectal temp. Asymptomatic without complaints. Walked with visitor to vending machine and back to room independently. Warming blankets placed on patient per MD. No further orders given. Will continue to monitor.

## 2014-05-07 NOTE — Discharge Instructions (Signed)
Surgery will be calling you to schedule your biopsy on Thursday. You will then be admitted to the hospital for further management at that time.   YOU ARE NOT TO EAT AFTER 12:00 AM (MIDNIGHT) ON Thursday PRIOR TO SURGERY.   1. You have a follow up appointment as scheduled:  Allison Groves, DO  On 05/15/2014 3:15 PM  [place] [address] [phone]  2. Please take all medications as prescribed.   3. If you have worsening of your symptoms or new symptoms arise, please call the clinic ([phone]), or go to the ER immediately if symptoms are severe.

## 2014-05-07 NOTE — Progress Notes (Signed)
Patient discharge teaching given, including activity, diet, follow-up appoints, and medications. Patient verbalized understanding of all discharge instructions. IV access was d/c'd. Vitals are stable. Skin is intact except as charted in most recent assessments. Pt to be escorted out by NT, to be driven home by family. 

## 2014-05-07 NOTE — Plan of Care (Signed)
Problem: Phase II Progression Outcomes Goal: Vital signs remain stable Outcome: Adequate for Discharge MD aware of patient's low temperature.

## 2014-05-07 NOTE — Consult Note (Signed)
[name] 10/05/1985  [id number].   Primary Care MD: Dr. Linus Salmons, ID Requesting MD: Dr.  Murriel Hopper Chief Complaint/Reason for Consult: mesenteric and retroperitoneal lymphadenopathy HPI: This is a 29 yo black female who has hemolytic anemia with HIV.  Over the last 2 months, she has started running fevers everyday between 102-103.  She states she has developed night sweats as well where she wakes up drenched and her clothes are wet too.  She has had no other pain or symptoms, until about a week ago, she developed chest pain.  She saw her PCP.  He thought she may have the flu and gave her Tamiflu.  She admits to ignoring her fevers, clearly for several months.  She has had about 10# of weight loss as well.  She presented to the [facility] on the 8th with continued chest pain, diarrhea, and fevers.  She was found to be anemic and was admitted for further work up.  Eventually she got a CT of her abdomen which revealed splenomegaly with extensive retroperitoneal and mesenteric adenopathy.  This was favored to be lymphoma.  Splenic microabscesses in the setting of immunodeficiency-related fungal disease could not be excluded.  We have been asked to see her for a lymph node biopsy to rule out lymphoma.  ROS : Please see HPI, otherwise negative  History reviewed. No pertinent family history.  Past Medical History  Diagnosis Date  . HIV (human immunodeficiency virus infection)   . Depression   . Anemia   . HSV (herpes simplex virus) infection   . History of shingles   . AIN III (anal intraepithelial neoplasia III)   . Condyloma acuminatum in female   . History of chronic bronchitis   . History of esophagitis     CANDIDA  . Periodontitis, chronic     Past Surgical History  Procedure Laterality Date  . Dilation and curettage of uterus  2005    MISSED AB  . Examination under anesthesia N/A 09/23/2012    Procedure: EXAM UNDER ANESTHESIA;  Surgeon: Adin Hector, MD;  Location: [facility];  Service: General;  Laterality: N/A;  . Laser ablation condolamata N/A 09/23/2012    Procedure: REMOVAL/ABLATION  ABLATION CONDOLAMATA WARTS;  Surgeon: Adin Hector, MD;  Location: [facility];  Service: General;  Laterality: N/A;    Social History:  reports that she has been smoking Cigars.  She started smoking about 7 weeks ago. She has never used smokeless tobacco. She reports that she uses illicit drugs (Marijuana). She reports that she does not drink alcohol.  Allergies: No Known Allergies  Medications Prior to Admission  Medication Sig Dispense Refill  . acyclovir ointment (ZOVIRAX) 5 % Apply 1 application topically every 8 (eight) hours.    Marland Kitchen atazanavir (REYATAZ) 300 MG capsule Take 1 capsule (300 mg total) by mouth daily with breakfast. TAKE 1 CAPSULE BY MOUTH DAILY WITH BREAKFAST. TAKE WITH NORVIR 30 capsule 5  . buPROPion (WELLBUTRIN XL) 150 MG 24 hr tablet Take 150 mg by mouth daily.    Marland Kitchen emtricitabine-tenofovir (TRUVADA) 200-300 MG per tablet Take 1 tablet by mouth daily. TAKE 1 TABLET BY MOUTH DAILY 30 tablet 5  . ondansetron (ZOFRAN) 4 MG tablet Take 1 tablet (4 mg total) by mouth every 8 (eight) hours as needed for nausea or vomiting. 20 tablet 0  . oseltamivir (TAMIFLU) 75 MG capsule Take 1 capsule (75 mg total) by mouth 2 (two) times daily. 10 capsule 0  .  Polyvinyl Alcohol-Povidone (CLEAR EYES ALL SEASONS) 5-6 MG/ML SOLN Place 2 drops into both eyes daily as needed (dry eyes).     . ritonavir (NORVIR) 100 MG TABS tablet Take 1 tablet (100 mg total) by mouth daily with breakfast. TAKE 1 TABLET BY MOUTH DAILY . TAKE WITH REYATAZ 30 tablet 5  . traZODone (DESYREL) 50 MG tablet Take 50 mg by mouth at bedtime.    . Norgestimate-Ethinyl Estradiol Triphasic 0.18/0.215/0.25 MG-35 MCG tablet Take 1 tablet by mouth daily. (Patient not taking: Reported on 04/05/2014) 1 Package 11  . ZOVIRAX 5 % APPLY TOPICALLY TO THE AFFECTED AREA(S) EVERY 8 HOURS (Patient  not taking: Reported on 05/04/2014) 5 g 5    Blood pressure 98/58, pulse 67, temperature 94.6 F (34.8 C), temperature source Rectal, resp. rate 18, height $RemoveBe'5\' 4"'[id number]$  (1.626 m), weight 94 lb 11.2 oz (42.956 kg), last menstrual period 03/19/2014, SpO2 100 %. Physical Exam: General: pleasant, skinny black female who is laying in bed in NAD HEENT: head is normocephalic, atraumatic.  Sclera are noninjected.  PERRL.  Ears and nose without any masses or lesions.  Mouth is pink and moist.  Multiple facial piercings  Heart: regular, rate, and rhythm.  Normal s1,s2. No obvious murmurs, gallops, or rubs noted.  Palpable radial and pedal pulses bilaterally Lungs: CTAB, no wheezes, rhonchi, or rales noted.  Respiratory effort nonlabored Abd: soft, NT, ND, +BS, no masses, hernias, or organomegaly, unable to palpate her spleen Lymph: no palpable LN in her submental, pre-post auricular, cervical chain, supraclavicular, axillary, or inguinal regions. Skin: warm and dry with no masses, lesions, or rashes Psych: A&Ox3 with an appropriate affect.    Results for orders placed or performed during the hospital encounter of 05/04/14 (from the past 48 hour(s))  ANA     Status: Abnormal   Collection Time: 05/05/14  4:30 PM  Result Value Ref Range   ANA Ser Ql POSITIVE (A) NEGATIVE    Comment: Performed at [facility]  Hepatitis panel, acute     Status: None   Collection Time: 05/05/14  4:30 PM  Result Value Ref Range   Hepatitis B Surface Ag NEGATIVE NEGATIVE   HCV Ab NEGATIVE NEGATIVE   Hep A IgM NON REACTIVE NON REACTIVE    Comment: (NOTE) Effective March 12, 2014, Hepatitis Acute Panel (test code [phone]) will be revised to automatically reflex to the Hepatitis C Viral RNA, Quantitative, Real-Time PCR assay if the Hepatitis C antibody screening result is Reactive. This action is being taken to ensure that the CDC/USPSTF recommended HCV diagnostic algorithm with the appropriate test reflex needed for  accurate interpretation is followed.    Hep B C IgM NON REACTIVE NON REACTIVE    Comment: (NOTE) High levels of Hepatitis B Core IgM antibody are detectable during the acute stage of Hepatitis B. This antibody is used to differentiate current from past HBV infection. Performed at [facility]   Sedimentation rate     Status: Abnormal   Collection Time: 05/05/14  4:30 PM  Result Value Ref Range   Sed Rate 27 (H) 0 - 22 mm/hr  TSH     Status: None   Collection Time: 05/05/14  4:30 PM  Result Value Ref Range   TSH 2.544 0.350 - 4.500 uIU/mL  Basic metabolic panel     Status: Abnormal   Collection Time: 05/05/14  4:30 PM  Result Value Ref Range   Sodium 132 (L) 135 - 145 mmol/L    Comment:  Please note change in reference range.   Potassium 3.9 3.5 - 5.1 mmol/L    Comment: Please note change in reference range.   Chloride 102 96 - 112 mEq/L   CO2 24 19 - 32 mmol/L   Glucose, Bld 133 (H) 70 - 99 mg/dL   BUN 7 6 - 23 mg/dL   Creatinine, Ser 0.59 0.50 - 1.10 mg/dL   Calcium 7.4 (L) 8.4 - 10.5 mg/dL   GFR calc non Af Amer >90 >90 mL/min   GFR calc Af Amer >90 >90 mL/min    Comment: (NOTE) The eGFR has been calculated using the CKD EPI equation. This calculation has not been validated in all clinical situations. eGFR's persistently <90 mL/min signify possible Chronic Kidney Disease.    Anion gap 6 5 - 15  Magnesium     Status: None   Collection Time: 05/05/14  4:30 PM  Result Value Ref Range   Magnesium 2.4 1.5 - 2.5 mg/dL  Mononucleosis screen     Status: None   Collection Time: 05/05/14  4:30 PM  Result Value Ref Range   Mono Screen NEGATIVE NEGATIVE  CBC     Status: Abnormal   Collection Time: 05/05/14  4:30 PM  Result Value Ref Range   WBC 5.3 4.0 - 10.5 K/uL   RBC 3.01 (L) 3.87 - 5.11 MIL/uL   Hemoglobin 8.2 (L) 12.0 - 15.0 g/dL    Comment: POST TRANSFUSION SPECIMEN   HCT 26.4 (L) 36.0 - 46.0 %   MCV 87.7 78.0 - 100.0 fL    Comment: POST TRANSFUSION  SPECIMEN   MCH 27.2 26.0 - 34.0 pg   MCHC 31.1 30.0 - 36.0 g/dL   RDW 21.0 (H) 11.5 - 15.5 %   Platelets 290 150 - 400 K/uL  Lactate dehydrogenase     Status: None   Collection Time: 05/05/14  4:30 PM  Result Value Ref Range   LDH 211 94 - 250 U/L  Basic metabolic panel     Status: Abnormal   Collection Time: 05/06/14  5:25 AM  Result Value Ref Range   Sodium 135 135 - 145 mmol/L    Comment: Please note change in reference range.   Potassium 3.4 (L) 3.5 - 5.1 mmol/L    Comment: Please note change in reference range.   Chloride 104 96 - 112 mEq/L   CO2 24 19 - 32 mmol/L   Glucose, Bld 137 (H) 70 - 99 mg/dL   BUN 9 6 - 23 mg/dL   Creatinine, Ser 0.71 0.50 - 1.10 mg/dL   Calcium 7.9 (L) 8.4 - 10.5 mg/dL   GFR calc non Af Amer >90 >90 mL/min   GFR calc Af Amer >90 >90 mL/min    Comment: (NOTE) The eGFR has been calculated using the CKD EPI equation. This calculation has not been validated in all clinical situations. eGFR's persistently <90 mL/min signify possible Chronic Kidney Disease.    Anion gap 7 5 - 15  CBC with Differential     Status: Abnormal   Collection Time: 05/06/14  5:25 AM  Result Value Ref Range   WBC 6.2 4.0 - 10.5 K/uL   RBC 3.05 (L) 3.87 - 5.11 MIL/uL   Hemoglobin 8.2 (L) 12.0 - 15.0 g/dL   HCT 26.8 (L) 36.0 - 46.0 %   MCV 87.9 78.0 - 100.0 fL   MCH 26.9 26.0 - 34.0 pg   MCHC 30.6 30.0 - 36.0 g/dL   RDW 21.8 (H) 11.5 -  15.5 %   Platelets 301 150 - 400 K/uL   Neutrophils Relative % 88 (H) 43 - 77 %   Lymphocytes Relative 4 (L) 12 - 46 %   Monocytes Relative 8 3 - 12 %   Eosinophils Relative 0 0 - 5 %   Basophils Relative 0 0 - 1 %   Neutro Abs 5.5 1.7 - 7.7 K/uL   Lymphs Abs 0.2 (L) 0.7 - 4.0 K/uL   Monocytes Absolute 0.5 0.1 - 1.0 K/uL   Eosinophils Absolute 0.0 0.0 - 0.7 K/uL   Basophils Absolute 0.0 0.0 - 0.1 K/uL   RBC Morphology POLYCHROMASIA PRESENT   Reticulocytes     Status: Abnormal   Collection Time: 05/06/14  5:25 AM  Result Value Ref  Range   Retic Ct Pct 3.9 (H) 0.4 - 3.1 %   RBC. 3.05 (L) 3.87 - 5.11 MIL/uL   Retic Count, Manual 119.0 19.0 - 186.0 K/uL  Lactate dehydrogenase     Status: None   Collection Time: 05/06/14  5:25 AM  Result Value Ref Range   LDH 178 94 - 250 U/L  Comprehensive metabolic panel     Status: Abnormal   Collection Time: 05/07/14  4:55 AM  Result Value Ref Range   Sodium 135 135 - 145 mmol/L    Comment: Please note change in reference range.   Potassium 3.5 3.5 - 5.1 mmol/L    Comment: Please note change in reference range.   Chloride 106 96 - 112 mEq/L   CO2 22 19 - 32 mmol/L   Glucose, Bld 179 (H) 70 - 99 mg/dL   BUN 9 6 - 23 mg/dL   Creatinine, Ser 0.79 0.50 - 1.10 mg/dL   Calcium 7.7 (L) 8.4 - 10.5 mg/dL   Total Protein 6.2 6.0 - 8.3 g/dL   Albumin 1.6 (L) 3.5 - 5.2 g/dL   AST 24 0 - 37 U/L   ALT 39 (H) 0 - 35 U/L   Alkaline Phosphatase 102 39 - 117 U/L   Total Bilirubin 2.2 (H) 0.3 - 1.2 mg/dL   GFR calc non Af Amer >90 >90 mL/min   GFR calc Af Amer >90 >90 mL/min    Comment: (NOTE) The eGFR has been calculated using the CKD EPI equation. This calculation has not been validated in all clinical situations. eGFR's persistently <90 mL/min signify possible Chronic Kidney Disease.    Anion gap 7 5 - 15  Uric acid     Status: None   Collection Time: 05/07/14  4:55 AM  Result Value Ref Range   Uric Acid, Serum 4.2 2.4 - 7.0 mg/dL  CBC with Differential     Status: Abnormal   Collection Time: 05/07/14  4:56 AM  Result Value Ref Range   WBC 8.2 4.0 - 10.5 K/uL   RBC 2.70 (L) 3.87 - 5.11 MIL/uL   Hemoglobin 7.5 (L) 12.0 - 15.0 g/dL   HCT 24.4 (L) 36.0 - 46.0 %   MCV 90.4 78.0 - 100.0 fL   MCH 27.8 26.0 - 34.0 pg   MCHC 30.7 30.0 - 36.0 g/dL   RDW 22.7 (H) 11.5 - 15.5 %   Platelets 308 150 - 400 K/uL   Neutrophils Relative % 91 (H) 43 - 77 %   Lymphocytes Relative 2 (L) 12 - 46 %   Monocytes Relative 7 3 - 12 %   Eosinophils Relative 0 0 - 5 %   Basophils Relative 0 0 - 1  %  Neutro Abs 7.4 1.7 - 7.7 K/uL   Lymphs Abs 0.2 (L) 0.7 - 4.0 K/uL   Monocytes Absolute 0.6 0.1 - 1.0 K/uL   Eosinophils Absolute 0.0 0.0 - 0.7 K/uL   Basophils Absolute 0.0 0.0 - 0.1 K/uL   RBC Morphology TARGET CELLS    US Abdomen Complete  05/05/2014   CLINICAL DATA:  Patient with history of HIV and anemia. Nausea, vomiting and diarrhea for 2 months.  EXAM: ULTRASOUND ABDOMEN COMPLETE  COMPARISON:  None.  FINDINGS: Gallbladder: The gallbladder is contracted. The wall measures up to 2.5 mm. Negative sonographic Murphy's sign. No pericholecystic fluid.  Common bile duct: Diameter: 2 mm  Liver: No focal lesion identified. Within normal limits in parenchymal echogenicity.  IVC: No abnormality visualized.  Pancreas: Visualized portion unremarkable.  Spleen: Enlarged and heterogeneous measuring up to 14.7 cm.  Right Kidney: Length: 11.4 cm. Diffusely increased cortical echogenicity. No hydronephrosis.  Left Kidney: Length: 11.4 cm. Diffusely increased cortical echogenicity. Trace pelviectasis.  Abdominal aorta: No aneurysm visualized.  Other findings: Within the region of the pancreas there is a 4.4 x 1.9 x 3.0 cm hypoechoic mass.  IMPRESSION: 1. There is a 4.4 cm hypoechoic mass within the region of the pancreas, nonspecific however favored to represent lymphadenopathy. 2. Splenomegaly.  Markedly heterogeneous splenic echotexture. 3. The gallbladder is contracted. There are no secondary signs to suggest acute cholecystitis including pericholecystic or sonographic Murphy's sign. 4. Increased renal cortical echogenicity as can be seen with chronic medical renal disease. Trace left pelviectasis. 5. Recommend correlation with CT given the peripancreatic masses and heterogeneity of the spleen. These results will be called to the ordering clinician or representative by the Radiologist Assistant, and communication documented in the PACS or zVision Dashboard.   Electronically Signed   By: Lovey Newcomer M.D.   On:  05/05/2014 15:50   Ct Abdomen Pelvis W Contrast  05/06/2014   CLINICAL DATA:  Fever. Shortness of breath. HIV-positive. Anemia. Epigastric pain.  EXAM: CT ABDOMEN AND PELVIS WITH CONTRAST  TECHNIQUE: Multidetector CT imaging of the abdomen and pelvis was performed using the standard protocol following bolus administration of intravenous contrast.  CONTRAST:  1 OMNIPAQUE IOHEXOL 300 MG/ML SOLN, 56m OMNIPAQUE IOHEXOL 300 MG/ML SOLN  COMPARISON:  05/05/2014  FINDINGS: Lower chest: Dependent subsegmental atelectasis or scarring in both lower lobes.  Hepatobiliary: Unremarkable  Pancreas: Unremarkable  Spleen: Abnormal hypodense micronodularity diffusely throughout the spleen. Splenic volume 610 cc compatible with splenomegaly. Splenic vein patent.  Adrenals/Urinary Tract: Unremarkable  Stomach/Bowel: Unremarkable  Vascular/Lymphatic: Mesenteric and retroperitoneal pathologic adenopathy. The confluence of the extensive adenopathy makes it difficult to measure, but an index left upper quadrant node at the level of the kidneys along the mesenteric measures 2.9 cm in short axis on image 35 series 2. A left-sided retroperitoneal lymph node measures 1.9 cm in short axis on image 53 series 2. The adenopathy involves the upper pelvis but not the lower pelvis.  Reproductive: Unremarkable  Other: Extensive mesenteric and subcutaneous edema diffusely compatible with third spacing of fluid, without a significant degree of ascites.  Musculoskeletal: Unremarkable  IMPRESSION: 1. Considerable splenomegaly with micronodular splenic hypodense pattern along with extensive retroperitoneal and mesenteric adenopathy. Appearance favors lymphoma. Splenic microabscesses, for example in the setting of immunodeficiency-related fungal disease, are a differential diagnostic consideration. 2. Diffuse subcutaneous and mesenteric edema compatible with third spacing of fluid.   Electronically Signed   By: WSherryl BartersM.D.   On: 05/06/2014  20:22  Assessment/Plan 1. Splenomegaly with mesenteric and retroperitoneal adenopathy 2. HIV 3. Hemolytic anemia 4. Fevers 5. Night sweats 6. Chest pain, unknown cause  Plan: 1. A CT of the chest is recommended in order to evaluate the chest area to make sure there are no other more easily accessible lymph nodes to biopsy.  None are palpable on exam.  If this is negative, the patient would like to be discharged today and come back on Thursday for surgery.  She would like need dx laparoscopy with possible ex lap with biopsy.  I have d/w Dr. Donne Hazel as well as the primary service.  Both are agreeable with this plan.  We will have our office set up surgery for Thursday.  She will have her surgery and then we will contact the IMTS for admission to their service.  Everyone is agreeable with this plan.  She will need to be NPO on Wednesday night after midnight for surgery the following morning.  We will check a PT/INR today prior to discharge to make sure her coags are normal.  Thank you for this consultation.      Evie Croston E 05/07/2014, 1:53 PM Pager: [phone]

## 2014-05-07 NOTE — Care Management Note (Signed)
    Page 1 of 1   05/07/2014     3:31:48 PM CARE MANAGEMENT NOTE 05/07/2014  Patient:  [name], [name]   Account Number:  [id number]  Date Initiated:  05/07/2014  Documentation initiated by:  Tomi Bamberger  Subjective/Objective Assessment:   dx cp  admit- lives with spouse.     Action/Plan:   Anticipated DC Date:  05/07/2014   Anticipated DC Plan:  [place]  CM consult      Choice offered to / List presented to:             Status of service:  Completed, signed off Medicare Important Message given?  NO (If response is "NO", the following Medicare IM given date fields will be blank) Date Medicare IM given:   Medicare IM given by:   Date Additional Medicare IM given:   Additional Medicare IM given by:    Discharge Disposition:  HOME/SELF CARE  Per UR Regulation:  Reviewed for med. necessity/level of care/duration of stay  If discussed at Long Length of Stay Meetings, dates discussed:    Comments:  Patient lives with spouse, per MD patient will only need prednisone at dc which is $4 at [facility], she will not need med ast.

## 2014-05-07 NOTE — Progress Notes (Signed)
Paged Dr. Ethelene Hal about patient's temp 94.6 rectal. Patient asymptomatic this morning. No further orders given, will continue to monitor.

## 2014-05-08 ENCOUNTER — Telehealth: Payer: Self-pay | Admitting: Hematology and Oncology

## 2014-05-08 LAB — TYPE AND SCREEN
ABO/RH(D): O NEG
Antibody Screen: POSITIVE
DAT, IgG: POSITIVE
UNIT DIVISION: 0
UNIT DIVISION: 0
UNIT DIVISION: 0

## 2014-05-08 LAB — OVA AND PARASITE EXAMINATION: Ova and parasites: NONE SEEN

## 2014-05-08 LAB — STOOL CULTURE

## 2014-05-08 LAB — FECAL LACTOFERRIN, QUANT: FECAL LACTOFERRIN: NEGATIVE

## 2014-05-08 LAB — HAPTOGLOBIN: HAPTOGLOBIN: 302 mg/dL — AB (ref 43–212)

## 2014-05-08 LAB — HIV-1 RNA QUANT-NO REFLEX-BLD
HIV 1 RNA Quant: <20 copies/mL (ref <20)
HIV-1 RNA Quant, Log: <1.3 {Log} (ref <1.30)

## 2014-05-08 LAB — RPR: RPR Ser Ql: NONREACTIVE

## 2014-05-08 NOTE — Telephone Encounter (Signed)
S/W PATIENT AND GAVE NP APPT FOR 01/20 @ 11:30 W/DR. [place]. STAFF MESSAGE SENT TO MW TO ADD 5 HOUR BLOOD PRODUCT

## 2014-05-08 NOTE — Discharge Summary (Signed)
Name: [name] MRN: [id number] DOB: [birth year] 29 y.o. 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 Active Problems:   Human immunodeficiency virus (HIV) disease   Depression   Underweight   SIRS (systemic inflammatory response syndrome)   Chest pain   Chronic diarrhea   Abnormal liver function tests   Bilateral leg pain   Proteinuria   Elevated serum glucose   HIV (human immunodeficiency virus infection)   Lymphadenopathy  Discharge Medications:   Medication List    STOP taking these medications        Norgestimate-Ethinyl Estradiol Triphasic 0.18/0.215/0.25 MG-35 MCG tablet     oseltamivir 75 MG capsule  Commonly known as:  TAMIFLU      TAKE these medications        atazanavir 300 MG capsule  Commonly known as:  REYATAZ  Take 1 capsule (300 mg total) by mouth daily with breakfast. TAKE 1 CAPSULE BY MOUTH DAILY WITH BREAKFAST. TAKE WITH NORVIR     CLEAR EYES ALL SEASONS 5-6 MG/ML Soln  Generic drug:  Polyvinyl Alcohol-Povidone  Place 2 drops into both eyes daily as needed (dry eyes).     emtricitabine-tenofovir 200-300 MG per tablet  Commonly known as:  TRUVADA  Take 1 tablet by mouth daily. TAKE 1 TABLET BY MOUTH DAILY     folic acid 1 MG tablet  Commonly known as:  FOLVITE  Take 1 tablet (1 mg total) by mouth daily.     ondansetron 4 MG tablet  Commonly known as:  ZOFRAN  Take 1 tablet (4 mg total) by mouth every 8 (eight) hours as needed for nausea or vomiting.     predniSONE 50 MG tablet  Commonly known as:  DELTASONE  Take 1 tablet (50 mg total) by mouth daily with breakfast. Take 50 mg once daily in AM     ritonavir 100 MG Tabs tablet  Commonly known as:  NORVIR  Take 1 tablet (100 mg total) by mouth daily with breakfast. TAKE 1 TABLET BY MOUTH DAILY . TAKE WITH REYATAZ     traZODone 50 MG tablet  Commonly known as:  DESYREL  Take 50 mg by mouth at bedtime.     WELLBUTRIN XL 150 MG 24 hr tablet  Generic drug:  buPROPion  Take 150 mg by mouth daily.     acyclovir ointment 5 %  Commonly known as:  ZOVIRAX  Apply 1 application topically every 8 (eight) hours.     ZOVIRAX 5 %  Generic drug:  acyclovir cream  APPLY TOPICALLY TO THE AFFECTED AREA(S) EVERY 8 HOURS        Disposition and follow-up:   [name] was discharged from [facility] in [place] condition.  At the hospital follow up visit please address:  1.  Patient is tentatively have excisional biopsy on 05/10/14 by general surgery for lymphadenopathy concerning for lymphoma. If so, patient will be readmitted to [facility] directly. There is a chance that this may be canceled in favor of an ENT procedure for biopsy given neck lymphadenopathy.   2.  Labs / imaging needed at time of follow-up: per surgery, per oncology  3.  Pending labs/ test needing follow-up: none  Follow-up Appointments: Follow-up Information    Follow up with Lucious Groves, DO On 05/15/2014.   Specialty:  Internal Medicine  Why:  3:15 PM   Contact information:   [place] [address] [phone]       Discharge Instructions:   Consultations: Treatment Team:  Md Ccs, MD  Procedures Performed:  Dg Chest 2 View  05/04/2014   CLINICAL DATA:  Chest pain and shortness of breath.  Fever.  EXAM: CHEST  2 VIEW  COMPARISON:  Two-view chest 06/14/2011  FINDINGS: The heart size is normal. The lungs are clear. Mild degenerative changes in the mid thoracic spine are stable.  IMPRESSION: Negative two view chest x-ray   Electronically Signed   By:  Santiago M.D.   On: 05/04/2014 14:50   Ct Chest W Contrast  05/07/2014   CLINICAL DATA:  Subsequent encounter for fever and shortness of breath in a patient with HIV infection. Now known to have extensive abdominal pelvic lymphadenopathy and  splenomegaly.  EXAM: CT CHEST WITH CONTRAST  TECHNIQUE: Multidetector CT imaging of the chest was performed during intravenous contrast administration.  CONTRAST:  63m OMNIPAQUE IOHEXOL 300 MG/ML  SOLN  COMPARISON:  Abdomen and pelvis CT earlier today.  FINDINGS: Mediastinum / Lymph Nodes: No axillary lymphadenopathy. 3.2 x 2.2 cm soft tissue lesion in the lower right neck/ thoracic inlet is compatible with lymphadenopathy. No lymphadenopathy within the mediastinal tissues of the chest. No hilar lymphadenopathy. The heart size is normal. No pericardial effusion  Lungs / Pleura: Mild paraseptal emphysema is seen in the lung apices. No focal airspace consolidation or pulmonary edema. 2 mm nodule is seen along the minor fissure (image 27 series 203, too small to characterize. No pulmonary parenchymal nodule or mass in the left lung. No evidence for pleural effusion.  MSK / Soft Tissues: Bone windows reveal no worrisome lytic or sclerotic osseous lesions.  Upper Abdomen:  Marked lymphadenopathy is seen in the upper abdomen.  IMPRESSION: Lymphadenopathy is identified in the lower right neck/thoracic inlet. No evidence for mediastinal or hilar lymphadenopathy.  Tiny right lung nodule is too small to characterize. This may be a subpleural lymph node attention on followup imaging suggested.   Electronically Signed   By: EMisty StanleyM.D.   On: 05/07/2014 15:18   UKoreaAbdomen Complete  05/05/2014   CLINICAL DATA:  Patient with history of HIV and anemia. Nausea, vomiting and diarrhea for 2 months.  EXAM: ULTRASOUND ABDOMEN COMPLETE  COMPARISON:  None.  FINDINGS: Gallbladder: The gallbladder is contracted. The wall measures up to 2.5 mm. Negative sonographic Murphy's sign. No pericholecystic fluid.  Common bile duct: Diameter: 2 mm  Liver: No focal lesion identified. Within normal limits in parenchymal echogenicity.  IVC: No abnormality visualized.  Pancreas: Visualized portion unremarkable.  Spleen: Enlarged and  heterogeneous measuring up to 14.7 cm.  Right Kidney: Length: 11.4 cm. Diffusely increased cortical echogenicity. No hydronephrosis.  Left Kidney: Length: 11.4 cm. Diffusely increased cortical echogenicity. Trace pelviectasis.  Abdominal aorta: No aneurysm visualized.  Other findings: Within the region of the pancreas there is a 4.4 x 1.9 x 3.0 cm hypoechoic mass.  IMPRESSION: 1. There is a 4.4 cm hypoechoic mass within the region of the pancreas, nonspecific however favored to represent lymphadenopathy. 2. Splenomegaly.  Markedly heterogeneous splenic echotexture. 3. The gallbladder is contracted. There are no secondary signs to suggest acute cholecystitis including pericholecystic or sonographic Murphy's sign. 4. Increased renal cortical echogenicity as can be seen with chronic medical renal disease. Trace left pelviectasis. 5. Recommend correlation with CT given the peripancreatic masses and heterogeneity of the spleen.  These results will be called to the ordering clinician or representative by the Radiologist Assistant, and communication documented in the PACS or zVision Dashboard.   Electronically Signed   By: Lovey Newcomer M.D.   On: 05/05/2014 15:50   Ct Abdomen Pelvis W Contrast  05/06/2014   CLINICAL DATA:  Fever. Shortness of breath. HIV-positive. Anemia. Epigastric pain.  EXAM: CT ABDOMEN AND PELVIS WITH CONTRAST  TECHNIQUE: Multidetector CT imaging of the abdomen and pelvis was performed using the standard protocol following bolus administration of intravenous contrast.  CONTRAST:  1 OMNIPAQUE IOHEXOL 300 MG/ML SOLN, 86m OMNIPAQUE IOHEXOL 300 MG/ML SOLN  COMPARISON:  05/05/2014  FINDINGS: Lower chest: Dependent subsegmental atelectasis or scarring in both lower lobes.  Hepatobiliary: Unremarkable  Pancreas: Unremarkable  Spleen: Abnormal hypodense micronodularity diffusely throughout the spleen. Splenic volume 610 cc compatible with splenomegaly. Splenic vein patent.  Adrenals/Urinary Tract:  Unremarkable  Stomach/Bowel: Unremarkable  Vascular/Lymphatic: Mesenteric and retroperitoneal pathologic adenopathy. The confluence of the extensive adenopathy makes it difficult to measure, but an index left upper quadrant node at the level of the kidneys along the mesenteric measures 2.9 cm in short axis on image 35 series 2. A left-sided retroperitoneal lymph node measures 1.9 cm in short axis on image 53 series 2. The adenopathy involves the upper pelvis but not the lower pelvis.  Reproductive: Unremarkable  Other: Extensive mesenteric and subcutaneous edema diffusely compatible with third spacing of fluid, without a significant degree of ascites.  Musculoskeletal: Unremarkable  IMPRESSION: 1. Considerable splenomegaly with micronodular splenic hypodense pattern along with extensive retroperitoneal and mesenteric adenopathy. Appearance favors lymphoma. Splenic microabscesses, for example in the setting of immunodeficiency-related fungal disease, are a differential diagnostic consideration. 2. Diffuse subcutaneous and mesenteric edema compatible with third spacing of fluid.   Electronically Signed   By: WSherryl BartersM.D.   On: 05/06/2014 20:22    Admission HPI:   Ms SM[place] a 29year old woman with well controlled HIV and anemia who presents with two months of productive cough, chest pain, SOB, and emesis.  She saw Dr [facility]inus [facility] on 05/01/14 for nausea, emesis, cough productive of white sputum, diarrhea and fever up to 101 with chills x 2 months. She also has green diarrhea 3x a day for these past two months with decreased appetite. She also notes bilateral LE numbness over this time. She says she has lost 10 lbs in the past two months. He started her on tamiflu and since feels dizzy. She was confused two nights ago and didn't know where she was while at home. Since this visit, she has developed fevers, chills and chest pain. She describes it as a tightness on her midsternum. Nothing makes it better or  worse. She also has leg pain x 2 months without swelling. She denies any melena or hematochezia. She last had her period 1 month ago. She notes polyuria. They are not usually heavy. She denies SOB, palpitations, abdominal pain, dysuria, headache, loss of consciousness, gait trouble, rashes. No new changes to her medications. She smokes marijuana 2 blunts a day and says it makes her feel better.  In the ED, she received 2 L NS, morphine 4 mg iv once, zofran 4 mg iv, and acetaminophen 650 mg po.  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 She was also found to have Coombs positive with warm antibodies. Interestingly though, LDH was within normal limits and a haptoglobin was elevated at 302, suggesting against a hemolytic process. A peripheral smear showed atypical monocytes as well as polychromasia and spherocytes. No schistocytes were noted. An abdominal ultrasound showed possible peripancreatic lymphadenopathy. CT of abdomen and pelvis showed substantial splenomegaly, retroperitoneal, and mesenteric lymphadenopathy, concerning for lymphoma. An ANA was positive which is consistent in the setting of lymphoproliferative or paraneoplastic setting. EBV IgG antibodies were positive indicating chronic infection but no evidence of IgM positivity. Mononucleosis screen was also negative. Overall, a lymphoma would also be consistent with a warm  antibody anemia. Patient was given 2 units of packed red blood cells with a increase in her hemoglobin to 8.2 posttransfusion and a hemoglobin of 7.5 on the day of discharge.  General surgery was consulted for further workup regarding an excisional biopsy to confirm the diagnosis of lymphoma. A chest CT was obtained to evaluate for other possible sites of lymphadenopathy for excisional biopsy. Although no lymphadenopathy was noted on physical exam, lymphadenopathy was identified in the lower right neck in the thoracic inlets on chest CT. Initially, patient is scheduled for a diagnostic laparoscopy on 05/10/2014 with general surgery. Patient was discharged on 05/07/2014 and instructed to return to the hospital for this procedure, after which she would be directly admitted back to our service. However, in light of the chest CT findings, further communication will be obtained with ENT regarding a alternative approach to tissue diagnosis through the neck. Further planning regarding this is pending at the time of this writing.  Otherwise, patient will be following up with Dr. Alvy Bimler for further long-term management and will need a head CT, echocardiogram, bone marrow biopsy with flow cytometry, and pulmonary function tests as an outpatient.  HIV: Followed by Dr Linus [facility]mons [facility]. Last CD4 count of 470 from October 2015. Repeat CD4 count of 120 during this admission.  Continued atazanavir 300 mg po daily, emtricitabine-tenofovir 200-300 mg po daily, ritonavir 100 mg po daily. Follow up [facility] on discharge. Consider adding prophylaxis once patient is readmitted later this week.   Hypothermia: Patient temperature ran low at times around 35C. Patient was asymptomatic from this perspective. Patient had no chills or subjective fevers.  Elevated LFTs: Initially elevated with AST of 152 and ALT of 85 with alkaline phosphatase of 144, though trended down to around wnl. Hepatitis panel negative.   Nausea/vomiting/diarrhea:   Patient initially started on Flagyl and Cipro for coverage of colitis. Abdominal CT was negative for any evidence of colitis and patient's symptoms had stabilized so antibiotics were discontinued.  Depression: Stable. Continued bupropion 150 mg po daily and trazodone 50 mg po qhs  Discharge Vitals:   BP 98/58 mmHg  Pulse 67  Temp(Src) 94.6 F (34.8 C) (Rectal)  Resp 18  Ht '5\' 4"'  (1.626 m)  Wt 94 lb 11.2 oz (42.956 kg)  BMI 16.25 kg/m2  SpO2 100%  LMP 03/19/2014  Discharge Labs:  Results for orders placed or performed during the hospital encounter of 05/04/14 (from the past 24 hour(s))  Protime-INR     Status:  Abnormal   Collection Time: 05/07/14  2:46 PM  Result Value Ref Range   Prothrombin Time 18.1 (H) 11.6 - 15.2 seconds   INR 1.48 0.00 - 1.49    Signed: Luan Moore, MD 05/08/2014, 11:42 AM    Services Ordered on Discharge: none Equipment Ordered on Discharge: none

## 2014-05-09 LAB — MYCOPLASMA PNEUMONIAE ANTIBODY, IGM: MYCOPLASMA PNEUMO IGM: 45 U/mL (ref <770)

## 2014-05-10 ENCOUNTER — Inpatient Hospital Stay: Admit: 2014-05-10 | Payer: Self-pay | Admitting: General Surgery

## 2014-05-10 ENCOUNTER — Telehealth: Payer: Self-pay | Admitting: *Deleted

## 2014-05-10 ENCOUNTER — Telehealth: Payer: Self-pay | Admitting: Hematology and Oncology

## 2014-05-10 LAB — CULTURE, BLOOD (ROUTINE X 2): Culture: NO GROWTH

## 2014-05-10 SURGERY — LAPAROSCOPY, DIAGNOSTIC
Anesthesia: General

## 2014-05-10 NOTE — Telephone Encounter (Signed)
Per staff message and POF I have scheduled appts. Advised scheduler of appts, and first available is 1/21 JMW

## 2014-05-10 NOTE — Telephone Encounter (Signed)
s.w. pt and advised on Jan appt.....pt ok and aware °

## 2014-05-11 LAB — CULTURE, BLOOD (ROUTINE X 2): Culture: NO GROWTH

## 2014-05-13 ENCOUNTER — Telehealth: Payer: Self-pay | Admitting: Internal Medicine

## 2014-05-13 NOTE — Telephone Encounter (Signed)
Having noticed that the patient has no-showed to a scheduled appointment with Dr. Wilburn Cornelia for further evaluation of her lymphadenopathy and suspected lymphoma, I called the patient regarding further options for her management. Patient stated that she did not go to Dr. Victorio Palm clinic because the co-pay was $100. I reinforced that her upcoming appointment with the [facility] would not have such a co-pay and encouraged that she make her appointment on Tuesday, 05/15/14 with Dr. Heber [place]. She voiced understanding and intention to make it to the appointment.

## 2014-05-14 LAB — GI PATHOGEN PANEL BY PCR, STOOL
C DIFFICILE TOXIN A/B: NOT DETECTED
Campylobacter by PCR: NOT DETECTED
Cryptosporidium by PCR: NOT DETECTED
E coli (ETEC) LT/ST: NOT DETECTED
E coli (STEC): NOT DETECTED
E coli 0157 by PCR: NOT DETECTED
G LAMBLIA BY PCR: NOT DETECTED
Norovirus GI/GII: NOT DETECTED
Rotavirus A by PCR: NOT DETECTED
Salmonella by PCR: NOT DETECTED
Shigella by PCR: NOT DETECTED

## 2014-05-14 LAB — MISCELLANEOUS TEST

## 2014-05-15 ENCOUNTER — Encounter: Payer: Self-pay | Admitting: Internal Medicine

## 2014-05-15 ENCOUNTER — Ambulatory Visit: Payer: Self-pay | Admitting: Internal Medicine

## 2014-05-16 ENCOUNTER — Ambulatory Visit (HOSPITAL_BASED_OUTPATIENT_CLINIC_OR_DEPARTMENT_OTHER): Payer: Self-pay | Admitting: Hematology and Oncology

## 2014-05-16 ENCOUNTER — Telehealth: Payer: Self-pay | Admitting: Hematology and Oncology

## 2014-05-16 ENCOUNTER — Other Ambulatory Visit (HOSPITAL_BASED_OUTPATIENT_CLINIC_OR_DEPARTMENT_OTHER): Payer: Self-pay

## 2014-05-16 ENCOUNTER — Ambulatory Visit: Payer: Self-pay

## 2014-05-16 ENCOUNTER — Encounter: Payer: Self-pay | Admitting: Hematology and Oncology

## 2014-05-16 VITALS — BP 113/66 | HR 108 | Temp 98.8°F | Resp 18 | Ht 64.0 in | Wt 92.9 lb

## 2014-05-16 DIAGNOSIS — D5911 Warm autoimmune hemolytic anemia: Secondary | ICD-10-CM

## 2014-05-16 DIAGNOSIS — R945 Abnormal results of liver function studies: Secondary | ICD-10-CM

## 2014-05-16 DIAGNOSIS — B20 Human immunodeficiency virus [HIV] disease: Secondary | ICD-10-CM

## 2014-05-16 DIAGNOSIS — R7989 Other specified abnormal findings of blood chemistry: Secondary | ICD-10-CM

## 2014-05-16 DIAGNOSIS — D591 Other autoimmune hemolytic anemias: Secondary | ICD-10-CM

## 2014-05-16 DIAGNOSIS — R591 Generalized enlarged lymph nodes: Secondary | ICD-10-CM

## 2014-05-16 DIAGNOSIS — D638 Anemia in other chronic diseases classified elsewhere: Secondary | ICD-10-CM

## 2014-05-16 LAB — COMPREHENSIVE METABOLIC PANEL (CC13)
ALK PHOS: 174 U/L — AB (ref 40–150)
ALT: 38 U/L (ref 0–55)
ANION GAP: 10 meq/L (ref 3–11)
AST: 33 U/L (ref 5–34)
Albumin: 2.4 g/dL — ABNORMAL LOW (ref 3.5–5.0)
BUN: 5.8 mg/dL — ABNORMAL LOW (ref 7.0–26.0)
CO2: 26 mEq/L (ref 22–29)
CREATININE: 0.7 mg/dL (ref 0.6–1.1)
Calcium: 9 mg/dL (ref 8.4–10.4)
Chloride: 101 mEq/L (ref 98–109)
Glucose: 111 mg/dl (ref 70–140)
Potassium: 3.1 mEq/L — ABNORMAL LOW (ref 3.5–5.1)
SODIUM: 137 meq/L (ref 136–145)
Total Bilirubin: 2.24 mg/dL — ABNORMAL HIGH (ref 0.20–1.20)
Total Protein: 8.7 g/dL — ABNORMAL HIGH (ref 6.4–8.3)

## 2014-05-16 LAB — CBC WITH DIFFERENTIAL/PLATELET
BASO%: 0.5 % (ref 0.0–2.0)
Basophils Absolute: 0 10*3/uL (ref 0.0–0.1)
EOS%: 0.2 % (ref 0.0–7.0)
Eosinophils Absolute: 0 10*3/uL (ref 0.0–0.5)
HEMATOCRIT: 30.5 % — AB (ref 34.8–46.6)
HGB: 9.7 g/dL — ABNORMAL LOW (ref 11.6–15.9)
LYMPH%: 5.2 % — ABNORMAL LOW (ref 14.0–49.7)
MCH: 29.8 pg (ref 25.1–34.0)
MCHC: 31.8 g/dL (ref 31.5–36.0)
MCV: 94 fL (ref 79.5–101.0)
MONO#: 0.8 10*3/uL (ref 0.1–0.9)
MONO%: 8.5 % (ref 0.0–14.0)
NEUT#: 8 10*3/uL — ABNORMAL HIGH (ref 1.5–6.5)
NEUT%: 85.6 % — AB (ref 38.4–76.8)
Platelets: 340 10*3/uL (ref 145–400)
RBC: 3.25 10*6/uL — AB (ref 3.70–5.45)
RDW: 24.3 % — ABNORMAL HIGH (ref 11.2–14.5)
WBC: 9.4 10*3/uL (ref 3.9–10.3)
lymph#: 0.5 10*3/uL — ABNORMAL LOW (ref 0.9–3.3)

## 2014-05-16 LAB — HOLD TUBE, BLOOD BANK

## 2014-05-16 LAB — URIC ACID (CC13): URIC ACID, SERUM: 3 mg/dL (ref 2.6–7.4)

## 2014-05-16 LAB — LACTATE DEHYDROGENASE (CC13): LDH: 206 U/L (ref 125–245)

## 2014-05-16 NOTE — Telephone Encounter (Signed)
Gave avs & cal for Feb. °

## 2014-05-16 NOTE — Progress Notes (Signed)
Checked in new pt with no insurance.  Pt informed me that she applied for Medicaid but was denied.  I gave her a financial application to apply for a discount thru the hospital.  Pt will bring the completed application and letter of support to me tomorrow and I will forward to pt accounting for processing.

## 2014-05-17 DIAGNOSIS — D638 Anemia in other chronic diseases classified elsewhere: Secondary | ICD-10-CM | POA: Insufficient documentation

## 2014-05-17 NOTE — Progress Notes (Signed)
[facility] CONSULT NOTE  Patient Care Team: Thayer Headings, MD as PCP - General (Internal Medicine) Thayer Headings, MD as PCP - Infectious Diseases (Infectious Diseases) Woodroe Mode, MD as Consulting Physician (Obstetrics and Gynecology)  CHIEF COMPLAINTS/PURPOSE OF CONSULTATION:  Significant lymphadenopathy, HIV patient, severe anemia, suspect lymphoma  HISTORY OF PRESENTING ILLNESS:  [name] 29 y.o. female is here because of referral from the hospital service. The patient was admitted to the hospital last week with high-grade fever, nausea, vomiting, diarrhea, weight loss, confusion and severe anemia. She was transfused in the hospital. CT scan showed abnormal lymphadenopathy, suspicious for lymphoma. She was originally scheduled to have open biopsy but that was subsequently cancelled when a separate abnormality was noted on the CT scan at the base of the neck. Unfortunately, due to financial resources, she was not able to be seen by any surgical services. She felt much better. She denies any further fevers or chills. All her abdominal symptoms such as nausea, vomiting, diarrhea had resolved. She is eating better and denies further weight loss. She denies other new palpable lymphadenopathy. She is compliant taking all her anti-retroviral medications.  MEDICAL HISTORY:  Past Medical History  Diagnosis Date  . HIV (human immunodeficiency virus infection)   . Depression   . Anemia   . HSV (herpes simplex virus) infection   . History of shingles   . AIN III (anal intraepithelial neoplasia III)   . Condyloma acuminatum in female   . History of chronic bronchitis   . History of esophagitis     CANDIDA  . Periodontitis, chronic     SURGICAL HISTORY: Past Surgical History  Procedure Laterality Date  . Dilation and curettage of uterus  2005    MISSED AB  . Examination under anesthesia N/A 09/23/2012    Procedure: EXAM UNDER ANESTHESIA;  Surgeon: Adin Hector, MD;  Location: [facility];  Service: General;  Laterality: N/A;  . Laser ablation condolamata N/A 09/23/2012    Procedure: REMOVAL/ABLATION  ABLATION CONDOLAMATA WARTS;  Surgeon: Adin Hector, MD;  Location: [facility];  Service: General;  Laterality: N/A;    SOCIAL HISTORY: History   Social History  . Marital Status: Single    Spouse Name: N/A    Number of Children: N/A  . Years of Education: N/A   Occupational History  . Not on file.   Social History Main Topics  . Smoking status: Former Smoker -- 0.00 packs/day for 7 years    Types: Cigars    Start date: 03/19/2014    Quit date: 03/19/2014  . Smokeless tobacco: Never Used     Comment: she smokes 3 Black and Mild Cigars daily  . Alcohol Use: No  . Drug Use: Yes    Special: Marijuana     Comment: 2 blunts per day  . Sexual Activity:    Partners: Male    Museum/gallery curator: None     Comment: pt. declined condoms   Other Topics Concern  . Not on file   Social History Narrative    FAMILY HISTORY: Family History  Problem Relation Age of Onset  . Cancer Maternal Aunt     unknown ca  . Cancer Maternal Grandmother     unknown ca    ALLERGIES:  has No Known Allergies.  MEDICATIONS:  Current Outpatient Prescriptions  Medication Sig Dispense Refill  . acyclovir ointment (ZOVIRAX) 5 % Apply 1 application topically every 8 (  eight) hours.    Marland Kitchen atazanavir (REYATAZ) 300 MG capsule Take 1 capsule (300 mg total) by mouth daily with breakfast. TAKE 1 CAPSULE BY MOUTH DAILY WITH BREAKFAST. TAKE WITH NORVIR 30 capsule 5  . buPROPion (WELLBUTRIN XL) 150 MG 24 hr tablet Take 150 mg by mouth daily.    Marland Kitchen emtricitabine-tenofovir (TRUVADA) 200-300 MG per tablet Take 1 tablet by mouth daily. TAKE 1 TABLET BY MOUTH DAILY 30 tablet 5  . folic acid (FOLVITE) 1 MG tablet Take 1 tablet (1 mg total) by mouth daily. 30 tablet 5  . ondansetron (ZOFRAN) 4 MG tablet Take 1 tablet (4 mg total) by  mouth every 8 (eight) hours as needed for nausea or vomiting. 20 tablet 0  . Polyvinyl Alcohol-Povidone (CLEAR EYES ALL SEASONS) 5-6 MG/ML SOLN Place 2 drops into both eyes daily as needed (dry eyes).     . predniSONE (DELTASONE) 50 MG tablet Take 1 tablet (50 mg total) by mouth daily with breakfast. Take 50 mg once daily in AM 3 tablet 0  . ritonavir (NORVIR) 100 MG TABS tablet Take 1 tablet (100 mg total) by mouth daily with breakfast. TAKE 1 TABLET BY MOUTH DAILY . TAKE WITH REYATAZ 30 tablet 5  . traZODone (DESYREL) 50 MG tablet Take 50 mg by mouth at bedtime.    Marland Kitchen ZOVIRAX 5 % APPLY TOPICALLY TO THE AFFECTED AREA(S) EVERY 8 HOURS (Patient not taking: Reported on 05/04/2014) 5 g 5   No current facility-administered medications for this visit.    REVIEW OF SYSTEMS:   Constitutional: Denies fevers, chills or abnormal night sweats Eyes: Denies blurriness of vision, double vision or watery eyes Ears, nose, mouth, throat, and face: Denies mucositis or sore throat Respiratory: Denies cough, dyspnea or wheezes Cardiovascular: Denies palpitation, chest discomfort or lower extremity swelling Gastrointestinal:  Denies nausea, heartburn or change in bowel habits Skin: Denies abnormal skin rashes Lymphatics: Denies new lymphadenopathy or easy bruising Neurological:Denies numbness, tingling or new weaknesses Behavioral/Psych: Mood is stable, no new changes  All other systems were reviewed with the patient and are negative.  PHYSICAL EXAMINATION: ECOG PERFORMANCE STATUS: 0 - Asymptomatic  Filed Vitals:   05/16/14 1211  BP: 113/66  Pulse: 108  Temp: 98.8 F (37.1 C)  Resp: 18   Filed Weights   05/16/14 1211  Weight: 92 lb 14.4 oz (42.139 kg)    GENERAL:alert, no distress and comfortable. She is extremely thin and cachectic SKIN: skin color, texture, turgor are normal, no rashes or significant lesions EYES: normal, conjunctiva are pink and non-injected, sclera clear OROPHARYNX:no exudate,  no erythema and lips, buccal mucosa, and tongue normal  NECK: supple, thyroid normal size, non-tender, without nodularity LYMPH:  no palpable lymphadenopathy in the cervical, axillary or inguinal LUNGS: clear to auscultation and percussion with normal breathing effort HEART: regular rate & rhythm and no murmurs and no lower extremity edema ABDOMEN:abdomen soft, non-tender and normal bowel sounds. She had mild splenomegaly Musculoskeletal:no cyanosis of digits and no clubbing  PSYCH: alert & oriented x 3 with fluent speech NEURO: no focal motor/sensory deficits  LABORATORY DATA:  I have reviewed the data as listed Lab Results  Component Value Date   WBC 9.4 05/16/2014   HGB 9.7* 05/16/2014   HCT 30.5* 05/16/2014   MCV 94.0 05/16/2014   PLT 340 05/16/2014    Recent Labs  05/04/14 1600  05/05/14 1630 05/06/14 0525 05/07/14 0455 05/16/14 1153  NA  --   < > 132* 135 135 137  K  --   < > 3.9 3.4* 3.5 3.1*  CL  --   < > 102 104 106  --   CO2  --   --  24 24 22 26   GLUCOSE  --   < > 133* 137* 179* 111  BUN  --   < > 7 9 9  5.8*  CREATININE  --   < > 0.59 0.71 0.79 0.7  CALCIUM  --   --  7.4* 7.9* 7.7* 9.0  GFRNONAA  --   --  >90 >90 >90  --   GFRAA  --   --  >90 >90 >90  --   PROT 8.1  --   --   --  6.2 8.7*  ALBUMIN 2.0*  --   --   --  1.6* 2.4*  AST 152*  --   --   --  24 33  ALT 85*  --   --   --  39* 38  ALKPHOS 144*  --   --   --  102 174*  BILITOT 2.9*  --   --   --  2.2* 2.24*  BILIDIR 0.4*  --   --   --   --   --   IBILI 2.5*  --   --   --   --   --   < > = values in this interval not displayed.  RADIOGRAPHIC STUDIES: I have personally reviewed the radiological images as listed and agreed with the findings in the report. Dg Chest 2 View  05/04/2014   CLINICAL DATA:  Chest pain and shortness of breath.  Fever.  EXAM: CHEST  2 VIEW  COMPARISON:  Two-view chest 06/14/2011  FINDINGS: The heart size is normal. The lungs are clear. Mild degenerative changes in the mid  thoracic spine are stable.  IMPRESSION: Negative two view chest x-ray   Electronically Signed   By: Lawrence Santiago M.D.   On: 05/04/2014 14:50   Ct Chest W Contrast  05/07/2014   CLINICAL DATA:  Subsequent encounter for fever and shortness of breath in a patient with HIV infection. Now known to have extensive abdominal pelvic lymphadenopathy and splenomegaly.  EXAM: CT CHEST WITH CONTRAST  TECHNIQUE: Multidetector CT imaging of the chest was performed during intravenous contrast administration.  CONTRAST:  51mL OMNIPAQUE IOHEXOL 300 MG/ML  SOLN  COMPARISON:  Abdomen and pelvis CT earlier today.  FINDINGS: Mediastinum / Lymph Nodes: No axillary lymphadenopathy. 3.2 x 2.2 cm soft tissue lesion in the lower right neck/ thoracic inlet is compatible with lymphadenopathy. No lymphadenopathy within the mediastinal tissues of the chest. No hilar lymphadenopathy. The heart size is normal. No pericardial effusion  Lungs / Pleura: Mild paraseptal emphysema is seen in the lung apices. No focal airspace consolidation or pulmonary edema. 2 mm nodule is seen along the minor fissure (image 27 series 203, too small to characterize. No pulmonary parenchymal nodule or mass in the left lung. No evidence for pleural effusion.  MSK / Soft Tissues: Bone windows reveal no worrisome lytic or sclerotic osseous lesions.  Upper Abdomen:  Marked lymphadenopathy is seen in the upper abdomen.  IMPRESSION: Lymphadenopathy is identified in the lower right neck/thoracic inlet. No evidence for mediastinal or hilar lymphadenopathy.  Tiny right lung nodule is too small to characterize. This may be a subpleural lymph node attention on followup imaging suggested.   Electronically Signed   By: Misty Stanley M.D.   On: 05/07/2014 15:18   US  Abdomen Complete  05/05/2014   CLINICAL DATA:  Patient with history of HIV and anemia. Nausea, vomiting and diarrhea for 2 months.  EXAM: ULTRASOUND ABDOMEN COMPLETE  COMPARISON:  None.  FINDINGS: Gallbladder: The  gallbladder is contracted. The wall measures up to 2.5 mm. Negative sonographic Murphy's sign. No pericholecystic fluid.  Common bile duct: Diameter: 2 mm  Liver: No focal lesion identified. Within normal limits in parenchymal echogenicity.  IVC: No abnormality visualized.  Pancreas: Visualized portion unremarkable.  Spleen: Enlarged and heterogeneous measuring up to 14.7 cm.  Right Kidney: Length: 11.4 cm. Diffusely increased cortical echogenicity. No hydronephrosis.  Left Kidney: Length: 11.4 cm. Diffusely increased cortical echogenicity. Trace pelviectasis.  Abdominal aorta: No aneurysm visualized.  Other findings: Within the region of the pancreas there is a 4.4 x 1.9 x 3.0 cm hypoechoic mass.  IMPRESSION: 1. There is a 4.4 cm hypoechoic mass within the region of the pancreas, nonspecific however favored to represent lymphadenopathy. 2. Splenomegaly.  Markedly heterogeneous splenic echotexture. 3. The gallbladder is contracted. There are no secondary signs to suggest acute cholecystitis including pericholecystic or sonographic Murphy's sign. 4. Increased renal cortical echogenicity as can be seen with chronic medical renal disease. Trace left pelviectasis. 5. Recommend correlation with CT given the peripancreatic masses and heterogeneity of the spleen. These results will be called to the ordering clinician or representative by the Radiologist Assistant, and communication documented in the PACS or zVision Dashboard.   Electronically Signed   By: Lovey Newcomer M.D.   On: 05/05/2014 15:50   Ct Abdomen Pelvis W Contrast  05/06/2014   CLINICAL DATA:  Fever. Shortness of breath. HIV-positive. Anemia. Epigastric pain.  EXAM: CT ABDOMEN AND PELVIS WITH CONTRAST  TECHNIQUE: Multidetector CT imaging of the abdomen and pelvis was performed using the standard protocol following bolus administration of intravenous contrast.  CONTRAST:  1 OMNIPAQUE IOHEXOL 300 MG/ML SOLN, 32mL OMNIPAQUE IOHEXOL 300 MG/ML SOLN  COMPARISON:   05/05/2014  FINDINGS: Lower chest: Dependent subsegmental atelectasis or scarring in both lower lobes.  Hepatobiliary: Unremarkable  Pancreas: Unremarkable  Spleen: Abnormal hypodense micronodularity diffusely throughout the spleen. Splenic volume 610 cc compatible with splenomegaly. Splenic vein patent.  Adrenals/Urinary Tract: Unremarkable  Stomach/Bowel: Unremarkable  Vascular/Lymphatic: Mesenteric and retroperitoneal pathologic adenopathy. The confluence of the extensive adenopathy makes it difficult to measure, but an index left upper quadrant node at the level of the kidneys along the mesenteric measures 2.9 cm in short axis on image 35 series 2. A left-sided retroperitoneal lymph node measures 1.9 cm in short axis on image 53 series 2. The adenopathy involves the upper pelvis but not the lower pelvis.  Reproductive: Unremarkable  Other: Extensive mesenteric and subcutaneous edema diffusely compatible with third spacing of fluid, without a significant degree of ascites.  Musculoskeletal: Unremarkable  IMPRESSION: 1. Considerable splenomegaly with micronodular splenic hypodense pattern along with extensive retroperitoneal and mesenteric adenopathy. Appearance favors lymphoma. Splenic microabscesses, for example in the setting of immunodeficiency-related fungal disease, are a differential diagnostic consideration. 2. Diffuse subcutaneous and mesenteric edema compatible with third spacing of fluid.   Electronically Signed   By: Sherryl Barters M.D.   On: 05/06/2014 20:22    ASSESSMENT & PLAN:  Lymphadenopathy She had diffuse lymphadenopathy, predominantly in her abdomen. She had recent abnormal liver function tests, symptoms of diarrhea and fever. In the HIV patient, it is very difficult to exclude infectious etiology. Certainly, lymphoma is a major differential diagnosis. Unfortunately, due to her lack of financial resources, she was not  able to see all surgical services due to high co-pay/upfront  payment. Her CT scan of the chest detected a thoracic inlet/base of neck abnormalities. Unfortunately, ENT service cannot see her as well without an upfront payment. I recommend a trial of ultrasound-guided core biopsy to see we can get tissue diagnosis that way. I will see her back in 2 weeks to review test results.   Abnormal liver function tests She has high bilirubin level. She was recently transfused. I recommend close monitoring. She is not symptomatic. Her recent hepatitis screen was negative.   Human immunodeficiency virus (HIV) disease Her last HIV viral load was undetectable. She is compliant taking all her medications.   Anemia in chronic illness She was recently transfused in the hospital. Her hemoglobin is stable.      All questions were answered. The patient knows to call the clinic with any problems, questions or concerns. I spent 55 minutes counseling the patient face to face. The total time spent in the appointment was 60 minutes and more than 50% was on counseling.     [place], [place], MD 05/17/2014 8:45 AM

## 2014-05-17 NOTE — Assessment & Plan Note (Signed)
She was recently transfused in the hospital. Her hemoglobin is stable.

## 2014-05-17 NOTE — Assessment & Plan Note (Signed)
She had diffuse lymphadenopathy, predominantly in her abdomen. She had recent abnormal liver function tests, symptoms of diarrhea and fever. In the HIV patient, it is very difficult to exclude infectious etiology. Certainly, lymphoma is a major differential diagnosis. Unfortunately, due to her lack of financial resources, she was not able to see all surgical services due to high co-pay/upfront payment. Her CT scan of the chest detected a thoracic inlet/base of neck abnormalities. Unfortunately, ENT service cannot see her as well without an upfront payment. I recommend a trial of ultrasound-guided core biopsy to see we can get tissue diagnosis that way. I will see her back in 2 weeks to review test results.

## 2014-05-17 NOTE — Assessment & Plan Note (Signed)
Her last HIV viral load was undetectable. She is compliant taking all her medications.

## 2014-05-17 NOTE — Assessment & Plan Note (Signed)
She has high bilirubin level. She was recently transfused. I recommend close monitoring. She is not symptomatic. Her recent hepatitis screen was negative.

## 2014-05-22 ENCOUNTER — Telehealth: Payer: Self-pay | Admitting: *Deleted

## 2014-05-22 NOTE — Telephone Encounter (Signed)
NO ENTRY 

## 2014-05-28 ENCOUNTER — Telehealth: Payer: Self-pay | Admitting: *Deleted

## 2014-05-28 ENCOUNTER — Telehealth: Payer: Self-pay | Admitting: Hematology and Oncology

## 2014-05-28 ENCOUNTER — Other Ambulatory Visit: Payer: Self-pay | Admitting: *Deleted

## 2014-05-28 NOTE — Telephone Encounter (Signed)
Left message to confirm appointment reschedule from 02/03 to 02/18.

## 2014-05-28 NOTE — Telephone Encounter (Signed)
-----   Message from Heath Lark, MD sent at 05/28/2014  8:28 AM EST ----- Regarding: biopsy Can you ask if radiology department needs an order? She is still not scheduled for a biopsy. We will need to also reschedule her return appt to 2/18 at 12 pm ----- Message -----    From: Heath Lark, MD    Sent: 05/23/2014  10:30 AM      To: Heath Lark, MD

## 2014-05-28 NOTE — Telephone Encounter (Signed)
Informed pt of MD visit moved from this week to 2/18 at 8:30 am..   She verbalized understanding and is aware of her biopsy scheduled for 2/9.

## 2014-05-29 ENCOUNTER — Other Ambulatory Visit: Payer: Self-pay | Admitting: Radiology

## 2014-05-30 ENCOUNTER — Ambulatory Visit (HOSPITAL_COMMUNITY): Payer: MEDICAID

## 2014-05-30 ENCOUNTER — Ambulatory Visit (HOSPITAL_COMMUNITY): Admission: RE | Admit: 2014-05-30 | Payer: MEDICAID | Source: Ambulatory Visit

## 2014-05-30 ENCOUNTER — Ambulatory Visit: Payer: Self-pay | Admitting: Hematology and Oncology

## 2014-05-31 ENCOUNTER — Ambulatory Visit: Payer: Self-pay | Admitting: Hematology and Oncology

## 2014-06-05 ENCOUNTER — Telehealth: Payer: Self-pay | Admitting: *Deleted

## 2014-06-05 ENCOUNTER — Telehealth: Payer: Self-pay | Admitting: Hematology and Oncology

## 2014-06-05 ENCOUNTER — Ambulatory Visit (HOSPITAL_COMMUNITY): Payer: MEDICAID

## 2014-06-05 ENCOUNTER — Other Ambulatory Visit: Payer: Self-pay | Admitting: Radiology

## 2014-06-05 ENCOUNTER — Ambulatory Visit (HOSPITAL_COMMUNITY): Payer: Self-pay

## 2014-06-05 NOTE — Telephone Encounter (Signed)
VERBAL ORDER AND READ BACK TO CINDEE BACON,NP- PT. TO COME TO OFFICE NOW. LEFT MESSAGE ON PT.'S HOME VOICE MAIL.

## 2014-06-05 NOTE — Telephone Encounter (Signed)
pt not feeling well.....transferred pt to triage

## 2014-06-05 NOTE — Telephone Encounter (Signed)
SPOKE WITH PT. SHE IS HAVING HER HAIR BRAIDED AND WOULD NOT BE ABLE TO COME UNTIL LATE AFTERNOON. OFFERED PT. TO COME TOMORROW BUT PT. WOULD NOT BE UNABLE TO COME UNTIL Friday. PT. HAS AN APPOINTMENT WITH A PHYSICIAN AT [facility] ON Thursday. SHE WILL CHECK AT THAT TIME ABOUT GETTING BLOOD. INSTRUCTED PT. TO GO TO THE EMERGENCY DEPARTMENT IF HER CONDITION WORSENS.

## 2014-06-05 NOTE — Telephone Encounter (Signed)
PT IS HAVING NO BREATHING PROBLEMS. YESTERDAY PT.'S TEMPERATURE WAS 101 AND SHE HAD A COUGH WITH WHITE SPUTUM. PT. TOOK ROBITUSSIN AND HER COUGH IS MUCH BETTER. PT.TOOK HER TEMPERATURE TWICE AND IT IS "95.9". SPOKE WITH PT.'S MOTHER. PT. LOOKS "A LITTLE PALE". PT WAS FEELING THIS WAY WHEN SHE WAS IN [place] AND RECEIVED BLOOD.

## 2014-06-06 ENCOUNTER — Other Ambulatory Visit: Payer: Self-pay | Admitting: Radiology

## 2014-06-07 ENCOUNTER — Ambulatory Visit (HOSPITAL_COMMUNITY)
Admission: RE | Admit: 2014-06-07 | Discharge: 2014-06-07 | Disposition: A | Discharge status: Home / Self Care | Payer: Medicaid Other | Source: Ambulatory Visit | Attending: Hematology and Oncology | Admitting: Hematology and Oncology

## 2014-06-07 ENCOUNTER — Encounter (HOSPITAL_COMMUNITY): Payer: Self-pay

## 2014-06-07 DIAGNOSIS — Z21 Asymptomatic human immunodeficiency virus [HIV] infection status: Secondary | ICD-10-CM | POA: Insufficient documentation

## 2014-06-07 DIAGNOSIS — R591 Generalized enlarged lymph nodes: Secondary | ICD-10-CM

## 2014-06-07 DIAGNOSIS — R599 Enlarged lymph nodes, unspecified: Secondary | ICD-10-CM | POA: Insufficient documentation

## 2014-06-07 LAB — APTT: APTT: 37 s (ref 24–37)

## 2014-06-07 LAB — CBC
HEMATOCRIT: 28 % — AB (ref 36.0–46.0)
Hemoglobin: 9 g/dL — ABNORMAL LOW (ref 12.0–15.0)
MCH: 27.6 pg (ref 26.0–34.0)
MCHC: 32.1 g/dL (ref 30.0–36.0)
MCV: 85.9 fL (ref 78.0–100.0)
PLATELETS: 270 10*3/uL (ref 150–400)
RBC: 3.26 MIL/uL — ABNORMAL LOW (ref 3.87–5.11)
RDW: 19.5 % — AB (ref 11.5–15.5)
WBC: 3.3 10*3/uL — AB (ref 4.0–10.5)

## 2014-06-07 LAB — PROTIME-INR
INR: 1.32 (ref 0.00–1.49)
PROTHROMBIN TIME: 16.6 s — AB (ref 11.6–15.2)

## 2014-06-07 LAB — HCG, SERUM, QUALITATIVE: Preg, Serum: NEGATIVE

## 2014-06-07 MED ORDER — LIDOCAINE HCL (PF) 1 % IJ SOLN
INTRAMUSCULAR | Status: AC
  Filled 2014-06-07: qty 10

## 2014-06-07 MED ORDER — MIDAZOLAM HCL 2 MG/2ML IJ SOLN
INTRAMUSCULAR | Status: AC
  Filled 2014-06-07: qty 4

## 2014-06-07 MED ORDER — MIDAZOLAM HCL 2 MG/2ML IJ SOLN
INTRAMUSCULAR | Status: AC | PRN
  Administered 2014-06-07 (×2): 0.5 mg via INTRAVENOUS

## 2014-06-07 MED ORDER — FENTANYL CITRATE 0.05 MG/ML IJ SOLN
INTRAMUSCULAR | Status: AC
  Filled 2014-06-07: qty 4

## 2014-06-07 MED ORDER — FENTANYL CITRATE 0.05 MG/ML IJ SOLN
INTRAMUSCULAR | Status: AC | PRN
  Administered 2014-06-07 (×2): 25 ug via INTRAVENOUS

## 2014-06-07 MED ORDER — SODIUM CHLORIDE 0.9 % IV SOLN
Freq: Once | INTRAVENOUS | Status: AC
  Administered 2014-06-07: 09:00:00 via INTRAVENOUS

## 2014-06-07 MED ORDER — CEFAZOLIN SODIUM-DEXTROSE 2-3 GM-% IV SOLR: 2.0000 g | INTRAVENOUS | Status: DC

## 2014-06-07 NOTE — Procedures (Signed)
Successful RT CERVICAL ADENOPATHY 18 G CORE BXS NO COMP Stable Full report in PACS PATH PENDING

## 2014-06-07 NOTE — Sedation Documentation (Signed)
Family updated as to patient's status, sitting with patient. Patient having a beverage., tolerating well.

## 2014-06-07 NOTE — Sedation Documentation (Signed)
Ice pack to right neck

## 2014-06-07 NOTE — H&P (Signed)
Chief Complaint: Rt neck mass  Referring Physician(s): Gorsuch,Ni  History of Present Illness: [name] is a 29 y.o. female   Hx HIV Pt was admitted to hospital few weeks ago with N/V/D; fever; wt loss Work up revealed extensive retroperitoneal and mesenteric lymphadenopathy CT also revealed Rt neck mass Worrisome for lymphoma per Dr Alvy Bimler Unable to have surgical consult/referral secondary finances Now scheduled for Rt neck mass core biopsy for possible tissue diagnosis   Past Medical History  Diagnosis Date  . HIV (human immunodeficiency virus infection)   . Depression   . Anemia   . HSV (herpes simplex virus) infection   . History of shingles   . AIN III (anal intraepithelial neoplasia III)   . Condyloma acuminatum in female   . History of chronic bronchitis   . History of esophagitis     CANDIDA  . Periodontitis, chronic     Past Surgical History  Procedure Laterality Date  . Dilation and curettage of uterus  2005    MISSED AB  . Examination under anesthesia N/A 09/23/2012    Procedure: EXAM UNDER ANESTHESIA;  Surgeon: Adin Hector, MD;  Location: [facility];  Service: General;  Laterality: N/A;  . Laser ablation condolamata N/A 09/23/2012    Procedure: REMOVAL/ABLATION  ABLATION CONDOLAMATA WARTS;  Surgeon: Adin Hector, MD;  Location: [facility];  Service: General;  Laterality: N/A;    Allergies: Review of patient's allergies indicates no known allergies.  Medications: Prior to Admission medications   Medication Sig Start Date End Date Taking? Authorizing Provider  atazanavir (REYATAZ) 300 MG capsule Take 1 capsule (300 mg total) by mouth daily with breakfast. TAKE 1 CAPSULE BY MOUTH DAILY WITH BREAKFAST. TAKE WITH NORVIR 12/05/13  Yes Thayer Headings, MD  emtricitabine-tenofovir (TRUVADA) 200-300 MG per tablet Take 1 tablet by mouth daily. TAKE 1 TABLET BY MOUTH DAILY 12/05/13  Yes Thayer Headings, MD  Naphazoline  HCl (CLEAR EYES OP) Apply 2 drops to eye 2 (two) times daily.   Yes Historical Provider, MD  ritonavir (NORVIR) 100 MG TABS tablet Take 1 tablet (100 mg total) by mouth daily with breakfast. TAKE 1 TABLET BY MOUTH DAILY . TAKE WITH REYATAZ 12/05/13  Yes Thayer Headings, MD  buPROPion (WELLBUTRIN XL) 150 MG 24 hr tablet Take 150 mg by mouth daily.    Historical Provider, MD  hydrocortisone cream 1 % Apply 1 application topically daily.    Historical Provider, MD  ondansetron (ZOFRAN) 4 MG tablet Take 1 tablet (4 mg total) by mouth every 8 (eight) hours as needed for nausea or vomiting. Patient not taking: Reported on 05/29/2014 05/01/14   Thayer Headings, MD  predniSONE (DELTASONE) 50 MG tablet Take 1 tablet (50 mg total) by mouth daily with breakfast. Take 50 mg once daily in AM Patient not taking: Reported on 05/29/2014 05/07/14   Corky Sox, MD  ZOVIRAX 5 % APPLY TOPICALLY TO THE AFFECTED AREA(S) EVERY 8 HOURS Patient not taking: Reported on 05/04/2014 02/26/14   Thayer Headings, MD    Family History  Problem Relation Age of Onset  . Cancer Maternal Aunt     unknown ca  . Cancer Maternal Grandmother     unknown ca    History   Social History  . Marital Status: Single    Spouse Name: N/A  . Number of Children: N/A  . Years of Education: N/A   Social History Main Topics  .  Smoking status: Former Smoker -- 0.00 packs/day for 7 years    Types: Cigars    Start date: 03/19/2014    Quit date: 03/19/2014  . Smokeless tobacco: Never Used     Comment: she smokes 3 Black and Mild Cigars daily  . Alcohol Use: No  . Drug Use: Yes    Special: Marijuana     Comment: 2 blunts per day  . Sexual Activity:    Partners: Male    Museum/gallery curator: None     Comment: pt. declined condoms   Other Topics Concern  . None   Social History Narrative      Review of Systems: A 12 point ROS discussed and pertinent positives are indicated in the HPI above.  All other systems are negative.  Review  of Systems  Constitutional: Positive for appetite change and fatigue. Negative for fever and activity change.  HENT: Negative for sore throat and trouble swallowing.   Respiratory: Negative for shortness of breath and wheezing.   Cardiovascular: Negative for chest pain.  Gastrointestinal: Positive for nausea, vomiting and diarrhea. Negative for abdominal pain.  Genitourinary: Negative for difficulty urinating.  Musculoskeletal: Negative for back pain.  Neurological: Negative for weakness.  Psychiatric/Behavioral: Negative for behavioral problems and confusion.     Vital Signs: BP 115/68 mmHg  Pulse 111  Temp(Src) 98.5 F (36.9 C) (Oral)  Resp 20  Ht 5\' 4"  (1.626 m)  Wt 45.36 kg (100 lb)  BMI 17.16 kg/m2  SpO2 100%  LMP 05/11/2014  Physical Exam  Constitutional: She is oriented to person, place, and time. She appears well-developed.  thin  Cardiovascular: Normal rate, regular rhythm and normal heart sounds.   No murmur heard. Pulmonary/Chest: Effort normal and breath sounds normal. She has no wheezes.  Abdominal: Soft. Bowel sounds are normal. There is no tenderness.  Musculoskeletal: Normal range of motion. She exhibits no tenderness.  Neurological: She is alert and oriented to person, place, and time.  Skin: Skin is warm and dry.  Psychiatric: She has a normal mood and affect. Her behavior is normal. Judgment and thought content normal.  Nursing note and vitals reviewed.   Imaging: No results found.  Labs:  CBC:  Recent Labs  05/06/14 0525 05/07/14 0456 05/16/14 1152 06/07/14 0850  WBC 6.2 8.2 9.4 3.3*  HGB 8.2* 7.5* 9.7* 9.0*  HCT 26.8* 24.4* 30.5* 28.0*  PLT 301 308 340 270    COAGS:  Recent Labs  05/07/14 1446 06/07/14 0850  INR 1.48 1.32  APTT  --  37    BMP:  Recent Labs  05/04/14 1410 05/04/14 1610 05/05/14 1630 05/06/14 0525 05/07/14 0455 05/16/14 1153  NA 129* 129* 132* 135 135 137  K 4.4 4.2 3.9 3.4* 3.5 3.1*  CL 93* 93* 102 104  106  --   CO2 26  --  24 24 22 26   GLUCOSE 104* 92 133* 137* 179* 111  BUN 8 10 7 9 9  5.8*  CALCIUM 7.9*  --  7.4* 7.9* 7.7* 9.0  CREATININE 0.82 0.80 0.59 0.71 0.79 0.7  GFRNONAA >90  --  >90 >90 >90  --   GFRAA >90  --  >90 >90 >90  --     LIVER FUNCTION TESTS:  Recent Labs  01/25/14 1626 05/04/14 1600 05/07/14 0455 05/16/14 1153  BILITOT 1.6* 2.9* 2.2* 2.24*  AST 15 152* 24 33  ALT 15 85* 39* 38  ALKPHOS 122* 144* 102 174*  PROT 8.6* 8.1 6.2 8.7*  ALBUMIN 3.2* 2.0* 1.6* 2.4*    TUMOR MARKERS: No results for input(s): AFPTM, CEA, CA199, CHROMGRNA in the last 8760 hours.  Assessment and Plan: Hx HIV Extensive RP and mesenteric LAN Rt neck mass Possible lymphoma Scheduled for core biopsy of Rt neck mass Pt aware of procedure benefits and risks including but not limited to Infection; bleeding; damage to surrounding structures Agreeable to proceed Consent signed andin chart   Thank you for this interesting consult.  I greatly enjoyed meeting [name] and look forward to participating in their care.  Signed: Takeia Ciaravino A 06/07/2014, 9:39 AM   I spent a total of 20 minutes face to face in clinical consultation, greater than 50% of which was counseling/coordinating care for Rt neck mass bx

## 2014-06-07 NOTE — Discharge Instructions (Signed)
Biopsy Care After Refer to this sheet in the next few weeks. These instructions provide you with information on caring for yourself after your procedure. Your caregiver may also give you more specific instructions. Your treatment has been planned according to current medical practices, but problems sometimes occur. Call your caregiver if you have any problems or questions after your procedure. If you had a fine needle biopsy, you may have soreness at the biopsy site for 1 to 2 days. If you had an open biopsy, you may have soreness at the biopsy site for 3 to 4 days. HOME CARE INSTRUCTIONS   You may resume normal diet and activities as directed.  Change bandages (dressings) as directed. If your wound was closed with a skin glue (adhesive), it will wear off and begin to peel in 7 days.  Only take over-the-counter or prescription medicines for pain, discomfort, or fever as directed by your caregiver.  Ask your caregiver when you can bathe and get your wound wet. SEEK IMMEDIATE MEDICAL CARE IF:   You have increased bleeding (more than a small spot) from the biopsy site.  You notice redness, swelling, or increasing pain at the biopsy site.  You have pus coming from the biopsy site.  You have a fever.  You notice a bad smell coming from the biopsy site or dressing.  You have a rash, have difficulty breathing, or have any allergic problems. MAKE SURE YOU:   Understand these instructions.  Will watch your condition.  Will get help right away if you are not doing well or get worse. Document Released: 10/31/2004 Document Revised: 07/06/2011 Document Reviewed: 10/09/2010 ExitCare Patient Information 2015 [employer]. This information is not intended to replace advice given to you by your health care provider. Make sure you discuss any questions you have with your health care provider.  

## 2014-06-07 NOTE — Sedation Documentation (Signed)
Patient moved to nurses station. Awaiting short stay bed

## 2014-06-09 ENCOUNTER — Other Ambulatory Visit: Payer: Self-pay | Admitting: Internal Medicine

## 2014-06-11 ENCOUNTER — Other Ambulatory Visit: Payer: Self-pay

## 2014-06-11 ENCOUNTER — Other Ambulatory Visit: Payer: Self-pay | Admitting: *Deleted

## 2014-06-11 DIAGNOSIS — B20 Human immunodeficiency virus [HIV] disease: Secondary | ICD-10-CM

## 2014-06-11 MED ORDER — ATAZANAVIR SULFATE 300 MG PO CAPS: 300.0000 mg | ORAL_CAPSULE | Freq: Every day | ORAL | Status: DC

## 2014-06-11 MED ORDER — RITONAVIR 100 MG PO TABS: 100.0000 mg | ORAL_TABLET | Freq: Every day | ORAL | Status: DC

## 2014-06-11 MED ORDER — EMTRICITABINE-TENOFOVIR DF 200-300 MG PO TABS: 1.0000 | ORAL_TABLET | Freq: Every day | ORAL | Status: DC

## 2014-06-12 ENCOUNTER — Other Ambulatory Visit: Payer: Self-pay | Admitting: Hematology and Oncology

## 2014-06-12 ENCOUNTER — Other Ambulatory Visit: Payer: Self-pay

## 2014-06-12 ENCOUNTER — Encounter: Payer: Self-pay | Admitting: Internal Medicine

## 2014-06-12 ENCOUNTER — Ambulatory Visit (INDEPENDENT_AMBULATORY_CARE_PROVIDER_SITE_OTHER): Payer: Self-pay | Admitting: Internal Medicine

## 2014-06-12 VITALS — Wt 89.0 lb

## 2014-06-12 DIAGNOSIS — C8190 Hodgkin lymphoma, unspecified, unspecified site: Secondary | ICD-10-CM

## 2014-06-12 DIAGNOSIS — C8110 Nodular sclerosis classical Hodgkin lymphoma, unspecified site: Secondary | ICD-10-CM | POA: Insufficient documentation

## 2014-06-12 DIAGNOSIS — Z21 Asymptomatic human immunodeficiency virus [HIV] infection status: Secondary | ICD-10-CM

## 2014-06-12 DIAGNOSIS — B20 Human immunodeficiency virus [HIV] disease: Secondary | ICD-10-CM

## 2014-06-12 HISTORY — DX: Hodgkin lymphoma, unspecified, unspecified site: C81.90

## 2014-06-12 MED ORDER — DRONABINOL 10 MG PO CAPS: 10.0000 mg | ORAL_CAPSULE | Freq: Two times a day (BID) | ORAL | Status: DC

## 2014-06-12 NOTE — Assessment & Plan Note (Signed)
She is doing well from this standpoint and will continue same regimen.

## 2014-06-12 NOTE — Assessment & Plan Note (Signed)
I reviewed the biopsy results and I discussed the findings with the patient and let her know that she has positive biopsy for lymphoma. She is going to follow-up with the hematologist this week. She understands the importance of this follow-up. I did discuss with her that this is treatable. I also discussed the need to try to get her weight up. Therefore going to give her some Marinol which she is going to start taking and try to increase her appetite and increase her intake as much as possible.

## 2014-06-12 NOTE — Progress Notes (Signed)
   Subjective:    Patient ID: [name], female    DOB: 1985-12-06, 29 y.o.   MRN: [id number]  HPI She is here for a work in visit. She came in today actually for her labs, though just had recent HIV viral load during hospitalization in May.  I worked her in today to talk to her about her recent hospitalization.  She was hospitalized mid January with intermittent nausea, vomiting and diarrhea as well as fever. She continued to have weight loss. Initially when I had seen her and she stated have been short-term though in interview during the hospitalization it was noted to be lasting months. She was noted have a hemoglobin of 5.9. Initially as well the resident physician noted splenomegaly and CAT scan was done which was significant for lymphadenopathy. Despite this, she had no palpable cervical, supraclavicular, axillary or inguinal adenopathy. She was transfused and discharged with a follow-up plan. She did see Dr. Ernst Spell of oncology and did have an IR guided biopsy of her right cervical adenopathy and pathology has now returned and is consistent with last cycle Hodgkin's lymphoma.    She also continues to have weight loss and poor appetite.   Review of Systems  Constitutional: Positive for activity change, appetite change, fatigue and unexpected weight change. Negative for fever and chills.  HENT: Negative for trouble swallowing.   Eyes: Negative for visual disturbance.  Respiratory: Negative for shortness of breath.   Gastrointestinal: Negative for nausea and diarrhea.  Skin: Negative for rash.  Neurological: Negative for dizziness, light-headedness and headaches.  Hematological: Negative for adenopathy.       Objective:   Physical Exam  Constitutional: She appears well-developed and well-nourished. No distress.  HENT:  Mouth/Throat: No oropharyngeal exudate.  Eyes: No scleral icterus.  Cardiovascular: Regular rhythm and normal heart sounds.   No murmur heard. Mildly tachycardic    Pulmonary/Chest: Effort normal and breath sounds normal. No respiratory distress. She has no wheezes.  Lymphadenopathy:    She has no cervical adenopathy.  Skin: No rash noted.          Assessment & Plan:

## 2014-06-14 ENCOUNTER — Telehealth: Payer: Self-pay | Admitting: Hematology and Oncology

## 2014-06-14 ENCOUNTER — Telehealth: Payer: Self-pay | Admitting: *Deleted

## 2014-06-14 ENCOUNTER — Encounter: Payer: Self-pay | Admitting: Hematology and Oncology

## 2014-06-14 ENCOUNTER — Ambulatory Visit: Payer: Self-pay | Admitting: Hematology and Oncology

## 2014-06-14 ENCOUNTER — Ambulatory Visit (HOSPITAL_BASED_OUTPATIENT_CLINIC_OR_DEPARTMENT_OTHER): Payer: Self-pay | Admitting: Hematology and Oncology

## 2014-06-14 VITALS — BP 104/57 | HR 108 | Temp 99.2°F | Resp 18 | Ht 64.0 in | Wt 90.2 lb

## 2014-06-14 DIAGNOSIS — C8190 Hodgkin lymphoma, unspecified, unspecified site: Secondary | ICD-10-CM

## 2014-06-14 DIAGNOSIS — B20 Human immunodeficiency virus [HIV] disease: Secondary | ICD-10-CM

## 2014-06-14 DIAGNOSIS — D638 Anemia in other chronic diseases classified elsewhere: Secondary | ICD-10-CM

## 2014-06-14 NOTE — Assessment & Plan Note (Signed)
We discussed the approach for Hodgkin lymphoma. She will need port placement, echocardiogram, pulmonary function test, blood work, PET/CT scan, bone marrow biopsy and chemotherapy education class. I will hold off bone marrow biopsy right now because she needs to undergo a lot of testing and I doubt I can squeeze in the bone marrow biopsy right now. If PET CT scan showed bone marrow involvement, per guidelines, we can probably skip bone marrow biopsy at diagnosis and proceed with treatment. I plan to see her back in 2 weeks to review all test results and to start her on treatment as soon as possible. To preserve fertility, I recommend we start her on Lupron injection before treatment. I also recommend consideration for referral to tertiary center but she is comfortable to be treated here. She will need treatment with ABVD

## 2014-06-14 NOTE — Progress Notes (Signed)
[facility] OFFICE PROGRESS NOTE  Patient Care Team: Thayer Headings, MD as PCP - General (Internal Medicine) Thayer Headings, MD as PCP - Infectious Diseases (Infectious Diseases) Woodroe Mode, MD as Consulting Physician (Obstetrics and Gynecology) Heath Lark, MD as Consulting Physician (Hematology and Oncology)  SUMMARY OF ONCOLOGIC HISTORY:   Hodgkin's lymphoma   05/06/2014 Imaging CT scan of the abdomen show diffuse mesenteric lymphadenopathy.   05/07/2014 Imaging CT scan of the chest show right thoracic inlet lymphadenopathy   06/07/2014 Procedure She underwent ultrasound-guided core biopsy of the neck lymph node   06/07/2014 Pathology Results Accession: [id number] biopsy confirmed diagnosis of Hodgkin lymphoma.    INTERVAL HISTORY: Please see below for problem oriented charting. She returns to review test results. She had mild pain from recent biopsy. Denies recent fevers or chills. Her appetite is stable. No recent infection.  REVIEW OF SYSTEMS:   Constitutional: Denies fevers, chills or abnormal weight loss Eyes: Denies blurriness of vision Ears, nose, mouth, throat, and face: Denies mucositis or sore throat Respiratory: Denies cough, dyspnea or wheezes Cardiovascular: Denies palpitation, chest discomfort or lower extremity swelling Gastrointestinal:  Denies nausea, heartburn or change in bowel habits Skin: Denies abnormal skin rashes Lymphatics: Denies new lymphadenopathy or easy bruising Neurological:Denies numbness, tingling or new weaknesses Behavioral/Psych: Mood is stable, no new changes  All other systems were reviewed with the patient and are negative.  I have reviewed the past medical history, past surgical history, social history and family history with the patient and they are unchanged from previous note.  ALLERGIES:  has No Known Allergies.  MEDICATIONS:  Current Outpatient Prescriptions  Medication Sig Dispense Refill  . atazanavir (REYATAZ)  300 MG capsule Take 1 capsule (300 mg total) by mouth daily with breakfast. TAKE WITH NORVIR 30 capsule 5  . buPROPion (WELLBUTRIN XL) 150 MG 24 hr tablet Take 150 mg by mouth daily.    Marland Kitchen dronabinol (MARINOL) 10 MG capsule Take 1 capsule (10 mg total) by mouth 2 (two) times daily before a meal. 60 capsule 5  . emtricitabine-tenofovir (TRUVADA) 200-300 MG per tablet Take 1 tablet by mouth daily. 30 tablet 5  . hydrocortisone cream 1 % Apply 1 application topically daily.    . Naphazoline HCl (CLEAR EYES OP) Apply 2 drops to eye 2 (two) times daily.    . ritonavir (NORVIR) 100 MG TABS tablet Take 1 tablet (100 mg total) by mouth daily with breakfast. TAKE WITH REYATAZ 30 tablet 5  . traZODone (DESYREL) 50 MG tablet Take 50 mg by mouth at bedtime.     No current facility-administered medications for this visit.    PHYSICAL EXAMINATION: ECOG PERFORMANCE STATUS: 0 - Asymptomatic  Filed Vitals:   06/14/14 0831  BP: 104/57  Pulse: 108  Temp: 99.2 F (37.3 C)  Resp: 18   Filed Weights   06/14/14 0831  Weight: 90 lb 3.2 oz (40.914 kg)    GENERAL:alert, no distress and comfortable. She looks thin and cachectic SKIN: skin color, texture, turgor are normal, no rashes or significant lesions EYES: normal, Conjunctiva are pale and non-injected, sclera clear Musculoskeletal:no cyanosis of digits and no clubbing  NEURO: alert & oriented x 3 with fluent speech, no focal motor/sensory deficits  LABORATORY DATA:  I have reviewed the data as listed    Component Value Date/Time   NA 137 05/16/2014 1153   NA 135 05/07/2014 0455   K 3.1* 05/16/2014 1153   K 3.5 05/07/2014 0455  CL 106 05/07/2014 0455   CO2 26 05/16/2014 1153   CO2 22 05/07/2014 0455   GLUCOSE 111 05/16/2014 1153   GLUCOSE 179* 05/07/2014 0455   BUN 5.8* 05/16/2014 1153   BUN 9 05/07/2014 0455   CREATININE 0.7 05/16/2014 1153   CREATININE 0.79 05/07/2014 0455   CREATININE 0.87 01/25/2014 1626   CALCIUM 9.0 05/16/2014 1153    CALCIUM 7.7* 05/07/2014 0455   PROT 8.7* 05/16/2014 1153   PROT 6.2 05/07/2014 0455   ALBUMIN 2.4* 05/16/2014 1153   ALBUMIN 1.6* 05/07/2014 0455   AST 33 05/16/2014 1153   AST 24 05/07/2014 0455   ALT 38 05/16/2014 1153   ALT 39* 05/07/2014 0455   ALKPHOS 174* 05/16/2014 1153   ALKPHOS 102 05/07/2014 0455   BILITOT 2.24* 05/16/2014 1153   BILITOT 2.2* 05/07/2014 0455   GFRNONAA >90 05/07/2014 0455   GFRNONAA >89 01/25/2014 1626   GFRAA >90 05/07/2014 0455   GFRAA >89 01/25/2014 1626    No results found for: SPEP, UPEP  Lab Results  Component Value Date   WBC 3.3* 06/07/2014   NEUTROABS 8.0* 05/16/2014   HGB 9.0* 06/07/2014   HCT 28.0* 06/07/2014   MCV 85.9 06/07/2014   PLT 270 06/07/2014      Chemistry      Component Value Date/Time   NA 137 05/16/2014 1153   NA 135 05/07/2014 0455   K 3.1* 05/16/2014 1153   K 3.5 05/07/2014 0455   CL 106 05/07/2014 0455   CO2 26 05/16/2014 1153   CO2 22 05/07/2014 0455   BUN 5.8* 05/16/2014 1153   BUN 9 05/07/2014 0455   CREATININE 0.7 05/16/2014 1153   CREATININE 0.79 05/07/2014 0455   CREATININE 0.87 01/25/2014 1626      Component Value Date/Time   CALCIUM 9.0 05/16/2014 1153   CALCIUM 7.7* 05/07/2014 0455   ALKPHOS 174* 05/16/2014 1153   ALKPHOS 102 05/07/2014 0455   AST 33 05/16/2014 1153   AST 24 05/07/2014 0455   ALT 38 05/16/2014 1153   ALT 39* 05/07/2014 0455   BILITOT 2.24* 05/16/2014 1153   BILITOT 2.2* 05/07/2014 0455      ASSESSMENT & PLAN:  Hodgkin's lymphoma We discussed the approach for Hodgkin lymphoma. She will need port placement, echocardiogram, pulmonary function test, blood work, PET/CT scan, bone marrow biopsy and chemotherapy education class. I will hold off bone marrow biopsy right now because she needs to undergo a lot of testing and I doubt I can squeeze in the bone marrow biopsy right now. If PET CT scan showed bone marrow involvement, per guidelines, we can probably skip bone marrow  biopsy at diagnosis and proceed with treatment. I plan to see her back in 2 weeks to review all test results and to start her on treatment as soon as possible. To preserve fertility, I recommend we start her on Lupron injection before treatment. I also recommend consideration for referral to tertiary center but she is comfortable to be treated here. She will need treatment with ABVD   Anemia in chronic illness She was recently transfused in the hospital. Her hemoglobin is stable.   Human immunodeficiency virus (HIV) disease Her last HIV viral load was undetectable. She is compliant taking all her medications. I will get pharmacy to check for potential drug interaction of her anti-retroviral treatment with chemotherapy.      Orders Placed This Encounter  Procedures  . NM PET Image Initial (PI) Skull Base To Thigh    Standing  Status: Future     Number of Occurrences:      Standing Expiration Date: 08/14/2015    Order Specific Question:  Reason for Exam (SYMPTOM  OR DIAGNOSIS REQUIRED)    Answer:  staging hodgkin lymphoma    Order Specific Question:  Is the patient pregnant?    Answer:  No    Order Specific Question:  Preferred imaging location?    Answer:  [facility]  . CBC with Differential    Standing Status: Standing     Number of Occurrences: 20     Standing Expiration Date: 06/15/2015  . Comprehensive metabolic panel    Standing Status: Standing     Number of Occurrences: 20     Standing Expiration Date: 06/15/2015  . Pregnancy, urine    Standing Status: Standing     Number of Occurrences: 22     Standing Expiration Date: 06/15/2015   All questions were answered. The patient knows to call the clinic with any problems, questions or concerns. No barriers to learning was detected. I spent 30 minutes counseling the patient face to face. The total time spent in the appointment was 40 minutes and more than 50% was on counseling and review of test results      [facility], [place], MD 06/14/2014 2:00 PM

## 2014-06-14 NOTE — Telephone Encounter (Signed)
Pt confirmed labs/ov/inj per 02/18 POF, gave pt AVS.... KJ, scheduled Echo for tomorrow at [facility], nothing available today also scheduled Chemo education class and sent msg to add chemo.Marland KitchenMarland KitchenMarland Kitchen

## 2014-06-14 NOTE — Progress Notes (Signed)
Pt is approved for the $400 [facility] grant.  °

## 2014-06-14 NOTE — Assessment & Plan Note (Signed)
She was recently transfused in the hospital. Her hemoglobin is stable.

## 2014-06-14 NOTE — Assessment & Plan Note (Signed)
Her last HIV viral load was undetectable. She is compliant taking all her medications. I will get pharmacy to check for potential drug interaction of her anti-retroviral treatment with chemotherapy.

## 2014-06-14 NOTE — Telephone Encounter (Signed)
Pt had requested letter for Disability.  Letter ready to pick up and left in folder w/ front desk.  Left VM for pt informing her of letter ready.

## 2014-06-15 ENCOUNTER — Telehealth: Payer: Self-pay | Admitting: Hematology and Oncology

## 2014-06-15 ENCOUNTER — Ambulatory Visit (HOSPITAL_COMMUNITY)
Admission: RE | Admit: 2014-06-15 | Discharge: 2014-06-15 | Disposition: A | Discharge status: Home / Self Care | Payer: Medicaid Other | Source: Ambulatory Visit | Attending: Cardiology | Admitting: Cardiology

## 2014-06-15 DIAGNOSIS — C8190 Hodgkin lymphoma, unspecified, unspecified site: Secondary | ICD-10-CM

## 2014-06-15 NOTE — Telephone Encounter (Signed)
Lft msg for pt to call Dr. Henreitta Leber office where she was going to cancel so that she can get her chemo on schedule after MD visit... KJ

## 2014-06-15 NOTE — Progress Notes (Signed)
2D Echocardiogram Complete.  06/15/2014   [name] [place], [place]

## 2014-06-18 ENCOUNTER — Other Ambulatory Visit: Payer: Self-pay

## 2014-06-18 ENCOUNTER — Telehealth: Payer: Self-pay | Admitting: Hematology and Oncology

## 2014-06-18 ENCOUNTER — Ambulatory Visit: Payer: Medicaid Other | Admitting: Hematology and Oncology

## 2014-06-18 NOTE — Telephone Encounter (Signed)
Lft msg for pt to confirm her chemo apt on 03/01 where she was going to r/s her MD visit with Dr. Linus Salmons..... KJ

## 2014-06-20 ENCOUNTER — Telehealth: Payer: Self-pay | Admitting: *Deleted

## 2014-06-20 NOTE — Telephone Encounter (Signed)
Per staff message and POF I have scheduled appts. Advised scheduler of appts and first available given. JMW  

## 2014-06-22 ENCOUNTER — Other Ambulatory Visit: Payer: Self-pay

## 2014-06-22 ENCOUNTER — Inpatient Hospital Stay (HOSPITAL_COMMUNITY): Admission: RE | Admit: 2014-06-22 | Payer: Self-pay | Source: Ambulatory Visit

## 2014-06-22 ENCOUNTER — Ambulatory Visit: Payer: Self-pay

## 2014-06-25 ENCOUNTER — Telehealth: Payer: Self-pay | Admitting: *Deleted

## 2014-06-25 ENCOUNTER — Ambulatory Visit: Payer: Self-pay | Admitting: Hematology and Oncology

## 2014-06-25 ENCOUNTER — Telehealth: Payer: Self-pay | Admitting: Hematology and Oncology

## 2014-06-25 ENCOUNTER — Ambulatory Visit (HOSPITAL_BASED_OUTPATIENT_CLINIC_OR_DEPARTMENT_OTHER): Payer: Self-pay

## 2014-06-25 ENCOUNTER — Other Ambulatory Visit: Payer: Self-pay | Admitting: Hematology and Oncology

## 2014-06-25 DIAGNOSIS — C8190 Hodgkin lymphoma, unspecified, unspecified site: Secondary | ICD-10-CM

## 2014-06-25 LAB — COMPREHENSIVE METABOLIC PANEL (CC13)
ALK PHOS: 215 U/L — AB (ref 40–150)
ALT: 62 U/L — ABNORMAL HIGH (ref 0–55)
AST: 46 U/L — ABNORMAL HIGH (ref 5–34)
Albumin: 1.7 g/dL — ABNORMAL LOW (ref 3.5–5.0)
Anion Gap: 9 mEq/L (ref 3–11)
BILIRUBIN TOTAL: 1.95 mg/dL — AB (ref 0.20–1.20)
BUN: 9.2 mg/dL (ref 7.0–26.0)
CO2: 23 mEq/L (ref 22–29)
Calcium: 8.2 mg/dL — ABNORMAL LOW (ref 8.4–10.4)
Chloride: 100 mEq/L (ref 98–109)
Creatinine: 0.6 mg/dL (ref 0.6–1.1)
EGFR: >90 mL/min/{1.73_m2} (ref >90)
Glucose: 102 mg/dl (ref 70–140)
Potassium: 4.5 mEq/L (ref 3.5–5.1)
SODIUM: 132 meq/L — AB (ref 136–145)
TOTAL PROTEIN: 7.1 g/dL (ref 6.4–8.3)

## 2014-06-25 LAB — CBC WITH DIFFERENTIAL/PLATELET
BASO%: 0.2 % (ref 0.0–2.0)
Basophils Absolute: 0 10*3/uL (ref 0.0–0.1)
EOS%: 0.2 % (ref 0.0–7.0)
Eosinophils Absolute: 0 10*3/uL (ref 0.0–0.5)
HCT: 18.9 % — ABNORMAL LOW (ref 34.8–46.6)
HEMOGLOBIN: 5.8 g/dL — AB (ref 11.6–15.9)
LYMPH%: 5.8 % — ABNORMAL LOW (ref 14.0–49.7)
MCH: 29.3 pg (ref 25.1–34.0)
MCHC: 30.9 g/dL — ABNORMAL LOW (ref 31.5–36.0)
MCV: 94.7 fL (ref 79.5–101.0)
MONO#: 1 10*3/uL — ABNORMAL HIGH (ref 0.1–0.9)
MONO%: 13 % (ref 0.0–14.0)
NEUT%: 80.8 % — ABNORMAL HIGH (ref 38.4–76.8)
NEUTROS ABS: 6.1 10*3/uL (ref 1.5–6.5)
PLATELETS: 388 10*3/uL (ref 145–400)
RBC: 1.99 10*6/uL — ABNORMAL LOW (ref 3.70–5.45)
RDW: 25.8 % — ABNORMAL HIGH (ref 11.2–14.5)
WBC: 7.5 10*3/uL (ref 3.9–10.3)
lymph#: 0.4 10*3/uL — ABNORMAL LOW (ref 0.9–3.3)

## 2014-06-25 LAB — PREGNANCY, URINE: PREG TEST UR: NEGATIVE

## 2014-06-25 NOTE — Telephone Encounter (Signed)
Left message for patient to call. Had labs drawn this morning per pt request

## 2014-06-25 NOTE — Telephone Encounter (Signed)
, °

## 2014-06-25 NOTE — Telephone Encounter (Signed)
Called patient again, states she did not get voicemail. Notified her of Hgb 5.8, is coming to see Dr Alvy Bimler at 0930 in am.

## 2014-06-26 ENCOUNTER — Ambulatory Visit: Payer: Self-pay

## 2014-06-26 ENCOUNTER — Ambulatory Visit (HOSPITAL_BASED_OUTPATIENT_CLINIC_OR_DEPARTMENT_OTHER): Payer: Self-pay | Admitting: Hematology and Oncology

## 2014-06-26 ENCOUNTER — Other Ambulatory Visit: Payer: Self-pay

## 2014-06-26 ENCOUNTER — Ambulatory Visit (HOSPITAL_COMMUNITY)
Admission: RE | Admit: 2014-06-26 | Discharge: 2014-06-26 | Disposition: A | Discharge status: Home / Self Care | Payer: Medicaid Other | Source: Ambulatory Visit | Attending: Hematology and Oncology | Admitting: Hematology and Oncology

## 2014-06-26 ENCOUNTER — Telehealth: Payer: Self-pay | Admitting: *Deleted

## 2014-06-26 ENCOUNTER — Ambulatory Visit: Payer: Self-pay | Admitting: Internal Medicine

## 2014-06-26 ENCOUNTER — Telehealth: Payer: Self-pay | Admitting: Hematology and Oncology

## 2014-06-26 VITALS — BP 112/68 | HR 109 | Temp 99.8°F | Resp 18 | Ht 64.0 in | Wt 93.1 lb

## 2014-06-26 DIAGNOSIS — R945 Abnormal results of liver function studies: Secondary | ICD-10-CM

## 2014-06-26 DIAGNOSIS — C8190 Hodgkin lymphoma, unspecified, unspecified site: Secondary | ICD-10-CM

## 2014-06-26 DIAGNOSIS — D638 Anemia in other chronic diseases classified elsewhere: Secondary | ICD-10-CM

## 2014-06-26 DIAGNOSIS — R7989 Other specified abnormal findings of blood chemistry: Secondary | ICD-10-CM

## 2014-06-26 DIAGNOSIS — D630 Anemia in neoplastic disease: Secondary | ICD-10-CM

## 2014-06-26 LAB — HOLD TUBE, BLOOD BANK

## 2014-06-26 MED ORDER — PROCHLORPERAZINE MALEATE 10 MG PO TABS: 10.0000 mg | ORAL_TABLET | Freq: Four times a day (QID) | ORAL | Status: DC | PRN

## 2014-06-26 MED ORDER — ONDANSETRON HCL 8 MG PO TABS: 8.0000 mg | ORAL_TABLET | Freq: Two times a day (BID) | ORAL | Status: DC

## 2014-06-26 MED ORDER — LIDOCAINE-PRILOCAINE 2.5-2.5 % EX CREA: TOPICAL_CREAM | CUTANEOUS | Status: DC

## 2014-06-26 NOTE — Assessment & Plan Note (Signed)
We discussed some of the risks, benefits, and alternatives of blood transfusions. The patient is symptomatic from anemia and the hemoglobin level is critically low.  Some of the side-effects to be expected including risks of transfusion reactions, chills, infection, syndrome of volume overload and risk of hospitalization from various reasons and the patient is willing to proceed and went ahead to sign consent today.  

## 2014-06-26 NOTE — Patient Instructions (Signed)
Return on 06/27/14 for blood transfusion.

## 2014-06-26 NOTE — Progress Notes (Signed)
[place] OFFICE PROGRESS NOTE  Patient Care Team: Thayer Headings, MD as PCP - General (Internal Medicine) Thayer Headings, MD as PCP - Infectious Diseases (Infectious Diseases) Woodroe Mode, MD as Consulting Physician (Obstetrics and Gynecology) Heath Lark, MD as Consulting Physician (Hematology and Oncology)  SUMMARY OF ONCOLOGIC HISTORY:   Hodgkin's lymphoma   05/06/2014 Imaging CT scan of the abdomen show diffuse mesenteric lymphadenopathy.   05/07/2014 Imaging CT scan of the chest show right thoracic inlet lymphadenopathy   06/07/2014 Procedure She underwent ultrasound-guided core biopsy of the neck lymph node   06/07/2014 Pathology Results Accession: [id number] biopsy confirmed diagnosis of Hodgkin lymphoma.   06/15/2014 Imaging Echocardiogram showed preserved ejection fraction    INTERVAL HISTORY: Please see below for problem oriented charting. She complained of tiredness and mild chest discomfort. She also has shortness of breath on exertion.  REVIEW OF SYSTEMS:   Constitutional: Denies fevers, chills or abnormal weight loss Eyes: Denies blurriness of vision Ears, nose, mouth, throat, and face: Denies mucositis or sore throat Gastrointestinal:  Denies nausea, heartburn or change in bowel habits Skin: Denies abnormal skin rashes Lymphatics: Denies new lymphadenopathy or easy bruising Neurological:Denies numbness, tingling or new weaknesses Behavioral/Psych: Mood is stable, no new changes  All other systems were reviewed with the patient and are negative.  I have reviewed the past medical history, past surgical history, social history and family history with the patient and they are unchanged from previous note.  ALLERGIES:  has No Known Allergies.  MEDICATIONS:  Current Outpatient Prescriptions  Medication Sig Dispense Refill  . atazanavir (REYATAZ) 300 MG capsule Take 1 capsule (300 mg total) by mouth daily with breakfast. TAKE WITH NORVIR 30 capsule 5  .  buPROPion (WELLBUTRIN XL) 150 MG 24 hr tablet Take 150 mg by mouth daily.    Marland Kitchen dronabinol (MARINOL) 10 MG capsule Take 1 capsule (10 mg total) by mouth 2 (two) times daily before a meal. 60 capsule 5  . emtricitabine-tenofovir (TRUVADA) 200-300 MG per tablet Take 1 tablet by mouth daily. 30 tablet 5  . hydrocortisone cream 1 % Apply 1 application topically daily.    . Naphazoline HCl (CLEAR EYES OP) Apply 2 drops to eye 2 (two) times daily.    . ritonavir (NORVIR) 100 MG TABS tablet Take 1 tablet (100 mg total) by mouth daily with breakfast. TAKE WITH REYATAZ 30 tablet 5  . traZODone (DESYREL) 50 MG tablet Take 50 mg by mouth at bedtime.    . lidocaine-prilocaine (EMLA) cream Apply to affected area once 30 g 3  . ondansetron (ZOFRAN) 8 MG tablet Take 1 tablet (8 mg total) by mouth 2 (two) times daily. Start the day after chemo for 3 days. Then take as needed for nausea or vomiting. 30 tablet 1  . prochlorperazine (COMPAZINE) 10 MG tablet Take 1 tablet (10 mg total) by mouth every 6 (six) hours as needed (Nausea or vomiting). 30 tablet 1   No current facility-administered medications for this visit.    PHYSICAL EXAMINATION: ECOG PERFORMANCE STATUS: 1 - Symptomatic but completely ambulatory  Filed Vitals:   06/26/14 0930  BP: 112/68  Pulse: 109  Temp: 99.8 F (37.7 C)  Resp: 18   Filed Weights   06/26/14 0930  Weight: 93 lb 1.6 oz (42.23 kg)    GENERAL:alert, no distress and comfortable SKIN: skin color, texture, turgor are normal, no rashes or significant lesions EYES: normal, Conjunctiva are pale and non-injected, sclera clear Musculoskeletal:no cyanosis of  digits and no clubbing  NEURO: alert & oriented x 3 with fluent speech, no focal motor/sensory deficits  LABORATORY DATA:  I have reviewed the data as listed    Component Value Date/Time   NA 132* 06/25/2014 0855   NA 135 05/07/2014 0455   K 4.5 06/25/2014 0855   K 3.5 05/07/2014 0455   CL 106 05/07/2014 0455   CO2 23  06/25/2014 0855   CO2 22 05/07/2014 0455   GLUCOSE 102 06/25/2014 0855   GLUCOSE 179* 05/07/2014 0455   BUN 9.2 06/25/2014 0855   BUN 9 05/07/2014 0455   CREATININE 0.6 06/25/2014 0855   CREATININE 0.79 05/07/2014 0455   CREATININE 0.87 01/25/2014 1626   CALCIUM 8.2* 06/25/2014 0855   CALCIUM 7.7* 05/07/2014 0455   PROT 7.1 06/25/2014 0855   PROT 6.2 05/07/2014 0455   ALBUMIN 1.7* 06/25/2014 0855   ALBUMIN 1.6* 05/07/2014 0455   AST 46* 06/25/2014 0855   AST 24 05/07/2014 0455   ALT 62* 06/25/2014 0855   ALT 39* 05/07/2014 0455   ALKPHOS 215* 06/25/2014 0855   ALKPHOS 102 05/07/2014 0455   BILITOT 1.95* 06/25/2014 0855   BILITOT 2.2* 05/07/2014 0455   GFRNONAA >90 05/07/2014 0455   GFRNONAA >89 01/25/2014 1626   GFRAA >90 05/07/2014 0455   GFRAA >89 01/25/2014 1626    No results found for: SPEP, UPEP  Lab Results  Component Value Date   WBC 7.5 06/25/2014   NEUTROABS 6.1 06/25/2014   HGB 5.8* 06/25/2014   HCT 18.9* 06/25/2014   MCV 94.7 06/25/2014   PLT 388 06/25/2014      Chemistry      Component Value Date/Time   NA 132* 06/25/2014 0855   NA 135 05/07/2014 0455   K 4.5 06/25/2014 0855   K 3.5 05/07/2014 0455   CL 106 05/07/2014 0455   CO2 23 06/25/2014 0855   CO2 22 05/07/2014 0455   BUN 9.2 06/25/2014 0855   BUN 9 05/07/2014 0455   CREATININE 0.6 06/25/2014 0855   CREATININE 0.79 05/07/2014 0455   CREATININE 0.87 01/25/2014 1626      Component Value Date/Time   CALCIUM 8.2* 06/25/2014 0855   CALCIUM 7.7* 05/07/2014 0455   ALKPHOS 215* 06/25/2014 0855   ALKPHOS 102 05/07/2014 0455   AST 46* 06/25/2014 0855   AST 24 05/07/2014 0455   ALT 62* 06/25/2014 0855   ALT 39* 05/07/2014 0455   BILITOT 1.95* 06/25/2014 0855   BILITOT 2.2* 05/07/2014 0455     ASSESSMENT & PLAN:  Hodgkin's lymphoma There is delay of starting treatment as she missed several appointments. I will request schedule pulmonary function tests. I recommend port placement. A  PET/CT scan scheduled for tomorrow. We will start treatment next week. We discussed the role of chemotherapy. The intent is for cure.  We discussed some of the risks, benefits and side-effects of Bleomycin, Vinblastine, Adriamycin, Dacarbazine and Solumedrol/Prednisone.   Some of the short term side-effects included, though not limited to, risk of fatigue, weight loss, tumor lysis syndrome, risk of allergic reactions, pancytopenia, life-threatening infections, need for transfusions of blood products, nausea, vomiting, change in bowel habits, hair loss, risk of congestive heart failure, admission to hospital for various reasons, and risks of death.   Long term side-effects are also discussed including permanent damage to nerve function, chronic fatigue, and rare secondary malignancy including bone marrow disorders.   The patient is aware that the response rates discussed earlier is not guaranteed.  After a long discussion, patient made an informed decision to proceed with the prescribed plan of care and went ahead to sign the consent form today.   Patient education material was dispensed   She will receive 2 units of blood transfusion today. She will also start Lupron injection to prevent pregnancy. Due to elevated liver function tests, I will reduce the dose of chemotherapy, Adriamycin and vinblastine by 50%. I will see her in 3 weeks prior to second cycle of treatment for assessment of toxicity.   Abnormal liver function tests She has high bilirubin level. I recommend close monitoring. She is not symptomatic. Her recent hepatitis screen was negative. I will reduce 2 of the chemotherapeutic agents by 50%.    Anemia in chronic illness We discussed some of the risks, benefits, and alternatives of blood transfusions. The patient is symptomatic from anemia and the hemoglobin level is critically low.  Some of the side-effects to be expected including risks of transfusion reactions, chills,  infection, syndrome of volume overload and risk of hospitalization from various reasons and the patient is willing to proceed and went ahead to sign consent today.       Orders Placed This Encounter  Procedures  . IR Fluoro Guide CV Line Right    Indicate type of CVC ordering    Standing Status: Future     Number of Occurrences:      Standing Expiration Date: 08/26/2015    Order Specific Question:  Reason for exam:    Answer:  need port for chemo, scheduled chemo 3/8    Order Specific Question:  Is the patient pregnant?    Answer:  No    Order Specific Question:  Preferred Imaging Location?    Answer:  [place]    Baseline Pulmonary Function Tests should be obtained prior to initiation of Bleomycin.  Marland Kitchen PHYSICIAN COMMUNICATION ORDER    A baseline Echo/ [place]uga should be obtained prior to initiation of Anthracycline Chemotherapy  . Hold Tube, Blood Bank    Standing Status: Standing     Number of Occurrences: 22     Standing Expiration Date: 06/26/2015   All questions were answered. The patient knows to call the clinic with any problems, questions or concerns. No barriers to learning was detected. I spent 30 minutes counseling the patient face to face. The total time spent in the appointment was 40 minutes and more than 50% was on counseling and review of test results     [facility], [place], MD 06/26/2014 10:12 AM

## 2014-06-26 NOTE — Telephone Encounter (Signed)
Per staff message and POF I have scheduled appts. Advised scheduler of appts and to move labs. JMW  

## 2014-06-26 NOTE — Telephone Encounter (Signed)
s.w. pt and advised on March 3.9 appt time change....pt ok and aware

## 2014-06-26 NOTE — Telephone Encounter (Signed)
gv and printed appt sched and avs for pt for March and April...emailed MW to add tx on 3.9

## 2014-06-26 NOTE — Assessment & Plan Note (Signed)
She has high bilirubin level. I recommend close monitoring. She is not symptomatic. Her recent hepatitis screen was negative. I will reduce 2 of the chemotherapeutic agents by 50%.

## 2014-06-26 NOTE — Assessment & Plan Note (Addendum)
There is delay of starting treatment as she missed several appointments. I will request schedule pulmonary function tests. I recommend port placement. A PET/CT scan scheduled for tomorrow. We will start treatment next week. We discussed the role of chemotherapy. The intent is for cure.  We discussed some of the risks, benefits and side-effects of Bleomycin, Vinblastine, Adriamycin, Dacarbazine and Solumedrol/Prednisone.   Some of the short term side-effects included, though not limited to, risk of fatigue, weight loss, tumor lysis syndrome, risk of allergic reactions, pancytopenia, life-threatening infections, need for transfusions of blood products, nausea, vomiting, change in bowel habits, hair loss, risk of congestive heart failure, admission to hospital for various reasons, and risks of death.   Long term side-effects are also discussed including permanent damage to nerve function, chronic fatigue, and rare secondary malignancy including bone marrow disorders.   The patient is aware that the response rates discussed earlier is not guaranteed.    After a long discussion, patient made an informed decision to proceed with the prescribed plan of care and went ahead to sign the consent form today.   Patient education material was dispensed   She will receive 2 units of blood transfusion today. She will also start Lupron injection to prevent pregnancy. Due to elevated liver function tests, I will reduce the dose of chemotherapy, Adriamycin and vinblastine by 50%. I will see her in 3 weeks prior to second cycle of treatment for assessment of toxicity.

## 2014-06-27 ENCOUNTER — Other Ambulatory Visit: Payer: Self-pay | Admitting: Hematology and Oncology

## 2014-06-27 ENCOUNTER — Other Ambulatory Visit: Payer: Self-pay | Admitting: *Deleted

## 2014-06-27 ENCOUNTER — Ambulatory Visit (HOSPITAL_BASED_OUTPATIENT_CLINIC_OR_DEPARTMENT_OTHER): Payer: Self-pay

## 2014-06-27 ENCOUNTER — Encounter (HOSPITAL_COMMUNITY): Payer: Self-pay

## 2014-06-27 ENCOUNTER — Encounter: Payer: Self-pay | Admitting: *Deleted

## 2014-06-27 ENCOUNTER — Ambulatory Visit (HOSPITAL_COMMUNITY)
Admission: RE | Admit: 2014-06-27 | Discharge: 2014-06-27 | Disposition: A | Discharge status: Home / Self Care | Payer: Self-pay | Source: Ambulatory Visit | Attending: Hematology and Oncology | Admitting: Hematology and Oncology

## 2014-06-27 VITALS — BP 99/61 | HR 96 | Temp 98.4°F | Resp 16

## 2014-06-27 DIAGNOSIS — C8190 Hodgkin lymphoma, unspecified, unspecified site: Secondary | ICD-10-CM

## 2014-06-27 DIAGNOSIS — D630 Anemia in neoplastic disease: Secondary | ICD-10-CM

## 2014-06-27 DIAGNOSIS — D638 Anemia in other chronic diseases classified elsewhere: Secondary | ICD-10-CM

## 2014-06-27 DIAGNOSIS — Z5111 Encounter for antineoplastic chemotherapy: Secondary | ICD-10-CM

## 2014-06-27 LAB — TYPE AND SCREEN
ABO/RH(D): O NEG
ANTIBODY SCREEN: POSITIVE
DAT, IgG: NEGATIVE
UNIT DIVISION: 0
Unit division: 0

## 2014-06-27 LAB — CBC WITH DIFFERENTIAL/PLATELET
BASO%: 0.2 % (ref 0.0–2.0)
Basophils Absolute: 0 10*3/uL (ref 0.0–0.1)
EOS ABS: 0 10*3/uL (ref 0.0–0.5)
EOS%: 0.2 % (ref 0.0–7.0)
HCT: 20.3 % — ABNORMAL LOW (ref 34.8–46.6)
HGB: 5.8 g/dL — CL (ref 11.6–15.9)
LYMPH%: 4.2 % — ABNORMAL LOW (ref 14.0–49.7)
MCH: 28.2 pg (ref 25.1–34.0)
MCHC: 28.6 g/dL — AB (ref 31.5–36.0)
MCV: 98.5 fL (ref 79.5–101.0)
MONO#: 0.9 10*3/uL (ref 0.1–0.9)
MONO%: 14.7 % — AB (ref 0.0–14.0)
NEUT%: 80.7 % — ABNORMAL HIGH (ref 38.4–76.8)
NEUTROS ABS: 5.2 10*3/uL (ref 1.5–6.5)
Platelets: 337 10*3/uL (ref 145–400)
RBC: 2.06 10*6/uL — AB (ref 3.70–5.45)
RDW: 24 % — ABNORMAL HIGH (ref 11.2–14.5)
WBC: 6.4 10*3/uL (ref 3.9–10.3)
lymph#: 0.3 10*3/uL — ABNORMAL LOW (ref 0.9–3.3)

## 2014-06-27 LAB — COMPREHENSIVE METABOLIC PANEL (CC13)
ALK PHOS: 237 U/L — AB (ref 40–150)
ALT: 81 U/L — AB (ref 0–55)
AST: 57 U/L — AB (ref 5–34)
Albumin: 1.7 g/dL — ABNORMAL LOW (ref 3.5–5.0)
Anion Gap: 12 mEq/L — ABNORMAL HIGH (ref 3–11)
BILIRUBIN TOTAL: 1.93 mg/dL — AB (ref 0.20–1.20)
BUN: 8.2 mg/dL (ref 7.0–26.0)
CO2: 23 mEq/L (ref 22–29)
CREATININE: 0.6 mg/dL (ref 0.6–1.1)
Calcium: 8.1 mg/dL — ABNORMAL LOW (ref 8.4–10.4)
Chloride: 97 mEq/L — ABNORMAL LOW (ref 98–109)
EGFR: >90 mL/min/{1.73_m2} (ref >90)
Glucose: 89 mg/dl (ref 70–140)
Potassium: 4 mEq/L (ref 3.5–5.1)
SODIUM: 132 meq/L — AB (ref 136–145)
TOTAL PROTEIN: 7.1 g/dL (ref 6.4–8.3)

## 2014-06-27 LAB — PREPARE RBC (CROSSMATCH)

## 2014-06-27 LAB — HOLD TUBE, BLOOD BANK

## 2014-06-27 LAB — PREGNANCY, URINE: PREG TEST UR: NEGATIVE

## 2014-06-27 MED ORDER — SODIUM CHLORIDE 0.9 % IV SOLN
250.0000 mL | Freq: Once | INTRAVENOUS | Status: AC
  Administered 2014-06-27: 250 mL via INTRAVENOUS

## 2014-06-27 MED ORDER — LEUPROLIDE ACETATE 7.5 MG IM KIT
7.5000 mg | PACK | Freq: Once | INTRAMUSCULAR | Status: AC
  Administered 2014-06-27: 7.5 mg via INTRAMUSCULAR
  Filled 2014-06-27: qty 7.5

## 2014-06-27 NOTE — Progress Notes (Unsigned)
Pt in lobby this morning states waiting for her PET scan.  Instructed pt to go to Radiology for PET scan and come back to [facility] after PET is completed.  Pt did go to Radiology but they had to r/s her PET to next week as pt was not NPO and was late for appt..  Reviewed new date and time of her PET scan.  It had to be r/s from this morning to next week due to pt was not NPO.   Pt insists no one had given her any instructions about not eating or drinking prior to PET scan.   Informed pt of Pulmonary Function Test rescheduled to tomorrow due to need for blood transfusion today.   Gave pt copy of her calendar.  Instructed her to arrive at [facility] at 9:45 am tomorrow morning.  No caffeine, No smoking, No inhalers for 4 hrs prior to test.   Gave pt written instructions along w/ verbal instructions.  Informed pt that Scheduler are supposed to give instructions when they call pt with appointments.   Asked her to please call Nurse here at [facility] if she ever has any questions about any appts or procedures.  She verbalized understanding.  Pt had also removed her Blue Blood Band bracelet and had to have her blood drawn again this morning for type and cross.  She says no one told her to leave her bracelet on for the transfusion.

## 2014-06-27 NOTE — Patient Instructions (Signed)

## 2014-06-28 ENCOUNTER — Inpatient Hospital Stay (HOSPITAL_COMMUNITY)
Admission: RE | Admit: 2014-06-28 | Discharge: 2014-06-28 | Disposition: A | Discharge status: Home / Self Care | Payer: Self-pay | Source: Ambulatory Visit | Attending: Hematology and Oncology | Admitting: Hematology and Oncology

## 2014-06-28 LAB — TYPE AND SCREEN
ABO/RH(D): O NEG
ANTIBODY SCREEN: POSITIVE
UNIT DIVISION: 0
UNIT DIVISION: 0

## 2014-07-02 ENCOUNTER — Encounter (HOSPITAL_COMMUNITY)
Admission: RE | Admit: 2014-07-02 | Discharge: 2014-07-02 | Disposition: A | Discharge status: Home / Self Care | Payer: Medicaid Other | Source: Ambulatory Visit | Attending: Hematology and Oncology | Admitting: Hematology and Oncology

## 2014-07-02 ENCOUNTER — Other Ambulatory Visit: Payer: Self-pay | Admitting: Radiology

## 2014-07-02 DIAGNOSIS — C8190 Hodgkin lymphoma, unspecified, unspecified site: Secondary | ICD-10-CM | POA: Insufficient documentation

## 2014-07-02 LAB — GLUCOSE, CAPILLARY: Glucose-Capillary: 82 mg/dL (ref 70–99)

## 2014-07-02 MED ORDER — FLUDEOXYGLUCOSE F - 18 (FDG) INJECTION
5.0500 | Freq: Once | INTRAVENOUS | Status: AC | PRN
  Administered 2014-07-02: 5.05 via INTRAVENOUS

## 2014-07-03 ENCOUNTER — Telehealth: Payer: Self-pay | Admitting: *Deleted

## 2014-07-03 ENCOUNTER — Ambulatory Visit (HOSPITAL_COMMUNITY): Admission: RE | Admit: 2014-07-03 | Payer: Self-pay | Source: Ambulatory Visit

## 2014-07-03 ENCOUNTER — Inpatient Hospital Stay (HOSPITAL_COMMUNITY): Admission: RE | Admit: 2014-07-03 | Payer: Self-pay | Source: Ambulatory Visit

## 2014-07-03 NOTE — Telephone Encounter (Signed)
Mother called back, states she s/w her daughter who told her the Port a cath placement was canceled by the Cardiopulmonary dept..  Pt told her mother that she was told the PFT had to be done before the [facility].   Informed mother this is not true.  Pt's PAC was not canceled and it does not matter the order of tests.  Pt needs to have PAC and PFT done prior to starting chemotherapy.  Instructed mother to have pt keep her appt as scheduled tomorrow for PFT.  Pt to keep next appt w/ Dr. Alvy Bimler as scheduled on 07/17/14.  She verbalized understanding.

## 2014-07-03 NOTE — Telephone Encounter (Signed)
-----   Message from Heath Lark, MD sent at 07/03/2014 10:42 AM EST ----- Regarding: No PFT She cancelled her PFT again. Can you see if she can have it done tomorrow, not today since she has port scheduled today. If she cannot have it done tomorrow before her chemo, she will not get chemo tomorrow

## 2014-07-03 NOTE — Telephone Encounter (Signed)
Rescheduled pt's PFT to tomorrow at 11 am.  Pt needs to arrive to [facility] Admitting at 10:45 am and no caffeine, no smoking and no inhalers for four hours prior to study.   I left VM for pt informing her of appt tomorrow and for her to call nurse back to confirm and for further instructions as above.

## 2014-07-03 NOTE — Telephone Encounter (Signed)
S/w pt's mother and informed her about PFT rescheduled to tomorrow.  Informed her this test needs to be done prior to starting chemotherapy.  Instructed her that pt needs to arrive to [facility] admitting at 10:45 am tomorro 3/9,  No smoking, no caffeine and no inhalers for four hours prior to test.  Informed her pt r/s her last test due to smoking and informed her this nurse had personally given her verbal and written instructions last week before the test.   Also informed mother pt is scheduled for [facility] today and she will not be able to start chemo w/o having PAC placed.  Her mother verbalized understanding.

## 2014-07-04 ENCOUNTER — Ambulatory Visit: Payer: Self-pay

## 2014-07-04 ENCOUNTER — Telehealth: Payer: Self-pay | Admitting: *Deleted

## 2014-07-04 ENCOUNTER — Ambulatory Visit (HOSPITAL_COMMUNITY)
Admission: RE | Admit: 2014-07-04 | Discharge: 2014-07-04 | Disposition: A | Discharge status: Home / Self Care | Payer: Medicaid Other | Source: Ambulatory Visit | Attending: Hematology and Oncology | Admitting: Hematology and Oncology

## 2014-07-04 ENCOUNTER — Other Ambulatory Visit: Payer: Self-pay

## 2014-07-04 DIAGNOSIS — F17210 Nicotine dependence, cigarettes, uncomplicated: Secondary | ICD-10-CM | POA: Diagnosis not present

## 2014-07-04 DIAGNOSIS — R05 Cough: Secondary | ICD-10-CM | POA: Diagnosis not present

## 2014-07-04 DIAGNOSIS — C8190 Hodgkin lymphoma, unspecified, unspecified site: Secondary | ICD-10-CM | POA: Diagnosis not present

## 2014-07-04 DIAGNOSIS — Z01818 Encounter for other preprocedural examination: Secondary | ICD-10-CM | POA: Diagnosis not present

## 2014-07-04 LAB — PULMONARY FUNCTION TEST
DL/VA % PRED: 121 %
DL/VA: 5.84 ml/min/mmHg/L
DLCO COR % PRED: 107 %
DLCO COR: 26.14 ml/min/mmHg
DLCO UNC % PRED: 69 %
DLCO UNC: 16.98 ml/min/mmHg
FEF 25-75 POST: 1.5 L/s
FEF 25-75 PRE: 3.67 L/s
FEF2575-%Change-Post: -59 %
FEF2575-%Pred-Post: 46 %
FEF2575-%Pred-Pre: 113 %
FEV1-%CHANGE-POST: -17 %
FEV1-%PRED-POST: 87 %
FEV1-%Pred-Pre: 105 %
FEV1-PRE: 2.9 L
FEV1-Post: 2.4 L
FEV1FVC-%CHANGE-POST: -5 %
FEV1FVC-%Pred-Pre: 102 %
FEV6-%Change-Post: -17 %
FEV6-%PRED-POST: 85 %
FEV6-%Pred-Pre: 103 %
FEV6-Post: 2.73 L
FEV6-Pre: 3.3 L
FEV6FVC-%PRED-POST: 101 %
FEV6FVC-%Pred-Pre: 101 %
FVC-%Change-Post: -12 %
FVC-%PRED-POST: 89 %
FVC-%PRED-PRE: 102 %
FVC-PRE: 3.3 L
FVC-Post: 2.89 L
POST FEV1/FVC RATIO: 83 %
POST FEV6/FVC RATIO: 100 %
PRE FEV6/FVC RATIO: 100 %
Pre FEV1/FVC ratio: 88 %
RV % PRED: 130 %
RV: 1.79 L
TLC % PRED: 95 %
TLC: 4.85 L

## 2014-07-04 MED ORDER — ALBUTEROL SULFATE (2.5 MG/3ML) 0.083% IN NEBU
2.5000 mg | INHALATION_SOLUTION | Freq: Once | RESPIRATORY_TRACT | Status: AC
  Administered 2014-07-04: 2.5 mg via RESPIRATORY_TRACT

## 2014-07-04 NOTE — Telephone Encounter (Signed)
PAC is rescheduled to Monday 3/21.  IR has notifed pt and given her instructions.  I called pt's mother and instructed on PAC scheduled 3/21,  Pt to arrive to [facility] at 12:30 pm.  She is not to have anything to eat or drink after 8 am and will need a driver home. Also reminded her of appt on Tuesday 3/22 lab/MD and chemo.  Mother verbalized understanding.

## 2014-07-05 ENCOUNTER — Other Ambulatory Visit: Payer: Self-pay | Admitting: *Deleted

## 2014-07-05 ENCOUNTER — Encounter: Payer: Self-pay | Admitting: *Deleted

## 2014-07-09 ENCOUNTER — Emergency Department (HOSPITAL_COMMUNITY): Payer: Medicaid Other

## 2014-07-09 ENCOUNTER — Encounter (HOSPITAL_COMMUNITY): Payer: Self-pay | Admitting: Oncology

## 2014-07-09 ENCOUNTER — Inpatient Hospital Stay (HOSPITAL_COMMUNITY)
Admission: EM | Admit: 2014-07-09 | Discharge: 2014-07-12 | DRG: 974 | Disposition: A | Discharge status: Home / Self Care | Payer: Medicaid Other | Attending: Internal Medicine | Admitting: Internal Medicine

## 2014-07-09 DIAGNOSIS — E872 Acidosis, unspecified: Secondary | ICD-10-CM

## 2014-07-09 DIAGNOSIS — I959 Hypotension, unspecified: Secondary | ICD-10-CM

## 2014-07-09 DIAGNOSIS — K6282 Dysplasia of anus: Secondary | ICD-10-CM | POA: Diagnosis present

## 2014-07-09 DIAGNOSIS — C8190 Hodgkin lymphoma, unspecified, unspecified site: Secondary | ICD-10-CM | POA: Diagnosis present

## 2014-07-09 DIAGNOSIS — Z7901 Long term (current) use of anticoagulants: Secondary | ICD-10-CM

## 2014-07-09 DIAGNOSIS — B20 Human immunodeficiency virus [HIV] disease: Secondary | ICD-10-CM | POA: Diagnosis not present

## 2014-07-09 DIAGNOSIS — T68XXXA Hypothermia, initial encounter: Secondary | ICD-10-CM

## 2014-07-09 DIAGNOSIS — D630 Anemia in neoplastic disease: Secondary | ICD-10-CM | POA: Diagnosis present

## 2014-07-09 DIAGNOSIS — Z21 Asymptomatic human immunodeficiency virus [HIV] infection status: Secondary | ICD-10-CM | POA: Diagnosis not present

## 2014-07-09 DIAGNOSIS — G9340 Encephalopathy, unspecified: Secondary | ICD-10-CM | POA: Diagnosis present

## 2014-07-09 DIAGNOSIS — I9589 Other hypotension: Secondary | ICD-10-CM

## 2014-07-09 DIAGNOSIS — F32A Depression, unspecified: Secondary | ICD-10-CM | POA: Diagnosis present

## 2014-07-09 DIAGNOSIS — K7200 Acute and subacute hepatic failure without coma: Secondary | ICD-10-CM | POA: Diagnosis present

## 2014-07-09 DIAGNOSIS — F329 Major depressive disorder, single episode, unspecified: Secondary | ICD-10-CM | POA: Diagnosis present

## 2014-07-09 DIAGNOSIS — Z79899 Other long term (current) drug therapy: Secondary | ICD-10-CM

## 2014-07-09 DIAGNOSIS — F1290 Cannabis use, unspecified, uncomplicated: Secondary | ICD-10-CM | POA: Diagnosis present

## 2014-07-09 DIAGNOSIS — B009 Herpesviral infection, unspecified: Secondary | ICD-10-CM | POA: Diagnosis present

## 2014-07-09 DIAGNOSIS — R6521 Severe sepsis with septic shock: Secondary | ICD-10-CM | POA: Diagnosis present

## 2014-07-09 DIAGNOSIS — F17210 Nicotine dependence, cigarettes, uncomplicated: Secondary | ICD-10-CM | POA: Diagnosis present

## 2014-07-09 DIAGNOSIS — R945 Abnormal results of liver function studies: Secondary | ICD-10-CM

## 2014-07-09 DIAGNOSIS — D638 Anemia in other chronic diseases classified elsewhere: Secondary | ICD-10-CM | POA: Diagnosis not present

## 2014-07-09 DIAGNOSIS — Z681 Body mass index (BMI) 19 or less, adult: Secondary | ICD-10-CM | POA: Diagnosis not present

## 2014-07-09 DIAGNOSIS — C8110 Nodular sclerosis classical Hodgkin lymphoma, unspecified site: Secondary | ICD-10-CM | POA: Diagnosis present

## 2014-07-09 DIAGNOSIS — A419 Sepsis, unspecified organism: Secondary | ICD-10-CM | POA: Diagnosis present

## 2014-07-09 DIAGNOSIS — E43 Unspecified severe protein-calorie malnutrition: Secondary | ICD-10-CM | POA: Diagnosis not present

## 2014-07-09 DIAGNOSIS — K0530 Chronic periodontitis, unspecified: Secondary | ICD-10-CM | POA: Diagnosis present

## 2014-07-09 DIAGNOSIS — R4182 Altered mental status, unspecified: Secondary | ICD-10-CM

## 2014-07-09 DIAGNOSIS — R636 Underweight: Secondary | ICD-10-CM | POA: Diagnosis present

## 2014-07-09 DIAGNOSIS — K209 Esophagitis, unspecified: Secondary | ICD-10-CM | POA: Diagnosis present

## 2014-07-09 DIAGNOSIS — R7989 Other specified abnormal findings of blood chemistry: Secondary | ICD-10-CM | POA: Diagnosis present

## 2014-07-09 DIAGNOSIS — R509 Fever, unspecified: Secondary | ICD-10-CM | POA: Diagnosis present

## 2014-07-09 DIAGNOSIS — IMO0001 Reserved for inherently not codable concepts without codable children: Secondary | ICD-10-CM | POA: Diagnosis present

## 2014-07-09 DIAGNOSIS — Z9119 Patient's noncompliance with other medical treatment and regimen: Secondary | ICD-10-CM | POA: Diagnosis present

## 2014-07-09 DIAGNOSIS — A630 Anogenital (venereal) warts: Secondary | ICD-10-CM | POA: Diagnosis present

## 2014-07-09 DIAGNOSIS — R410 Disorientation, unspecified: Secondary | ICD-10-CM

## 2014-07-09 DIAGNOSIS — R6889 Other general symptoms and signs: Secondary | ICD-10-CM

## 2014-07-09 DIAGNOSIS — D013 Carcinoma in situ of anus and anal canal: Secondary | ICD-10-CM | POA: Diagnosis present

## 2014-07-09 LAB — URINALYSIS, ROUTINE W REFLEX MICROSCOPIC
Bilirubin Urine: NEGATIVE
Glucose, UA: NEGATIVE mg/dL
Hgb urine dipstick: NEGATIVE
Ketones, ur: NEGATIVE mg/dL
Nitrite: NEGATIVE
PROTEIN: NEGATIVE mg/dL
SPECIFIC GRAVITY, URINE: 1.017 (ref 1.005–1.030)
UROBILINOGEN UA: 1 mg/dL (ref 0.0–1.0)
pH: 5.5 (ref 5.0–8.0)

## 2014-07-09 LAB — I-STAT CG4 LACTIC ACID, ED
LACTIC ACID, VENOUS: 5.31 mmol/L — AB (ref 0.5–2.0)
Lactic Acid, Venous: 3.44 mmol/L (ref 0.5–2.0)

## 2014-07-09 LAB — COMPREHENSIVE METABOLIC PANEL
ALBUMIN: 1.8 g/dL — AB (ref 3.5–5.2)
ALT: 41 U/L — AB (ref 0–35)
ALT: 33 U/L (ref 0–35)
AST: 38 U/L — AB (ref 0–37)
AST: 29 U/L (ref 0–37)
Albumin: 1.5 g/dL — ABNORMAL LOW (ref 3.5–5.2)
Alkaline Phosphatase: 145 U/L — ABNORMAL HIGH (ref 39–117)
Alkaline Phosphatase: 121 U/L — ABNORMAL HIGH (ref 39–117)
Anion gap: 13 (ref 5–15)
Anion gap: 6 (ref 5–15)
BUN: 15 mg/dL (ref 6–23)
BUN: 14 mg/dL (ref 6–23)
CALCIUM: 7.2 mg/dL — AB (ref 8.4–10.5)
CHLORIDE: 96 mmol/L (ref 96–112)
CO2: 23 mmol/L (ref 19–32)
CO2: 22 mmol/L (ref 19–32)
Calcium: 8.3 mg/dL — ABNORMAL LOW (ref 8.4–10.5)
Chloride: 103 mmol/L (ref 96–112)
Creatinine, Ser: 0.51 mg/dL (ref 0.50–1.10)
Creatinine, Ser: 0.35 mg/dL — ABNORMAL LOW (ref 0.50–1.10)
GFR calc Af Amer: >90 mL/min (ref >90)
GLUCOSE: 125 mg/dL — AB (ref 70–99)
Glucose, Bld: 138 mg/dL — ABNORMAL HIGH (ref 70–99)
POTASSIUM: 4 mmol/L (ref 3.5–5.1)
Potassium: 3 mmol/L — ABNORMAL LOW (ref 3.5–5.1)
SODIUM: 132 mmol/L — AB (ref 135–145)
Sodium: 131 mmol/L — ABNORMAL LOW (ref 135–145)
TOTAL PROTEIN: 6.6 g/dL (ref 6.0–8.3)
Total Bilirubin: 2.9 mg/dL — ABNORMAL HIGH (ref 0.3–1.2)
Total Bilirubin: 1.8 mg/dL — ABNORMAL HIGH (ref 0.3–1.2)
Total Protein: 5.4 g/dL — ABNORMAL LOW (ref 6.0–8.3)

## 2014-07-09 LAB — RAPID URINE DRUG SCREEN, HOSP PERFORMED
AMPHETAMINES: NOT DETECTED
BENZODIAZEPINES: NOT DETECTED
Barbiturates: NOT DETECTED
COCAINE: NOT DETECTED
OPIATES: NOT DETECTED
Tetrahydrocannabinol: POSITIVE — AB

## 2014-07-09 LAB — INFLUENZA PANEL BY PCR (TYPE A & B)
H1N1 flu by pcr: NOT DETECTED
INFLAPCR: NEGATIVE
INFLBPCR: NEGATIVE

## 2014-07-09 LAB — URINE MICROSCOPIC-ADD ON

## 2014-07-09 LAB — CBC WITH DIFFERENTIAL/PLATELET
BASOS PCT: 0 % (ref 0–1)
Basophils Absolute: 0 10*3/uL (ref 0.0–0.1)
EOS ABS: 0 10*3/uL (ref 0.0–0.7)
Eosinophils Relative: 0 % (ref 0–5)
HEMATOCRIT: 24.6 % — AB (ref 36.0–46.0)
HEMOGLOBIN: 7.7 g/dL — AB (ref 12.0–15.0)
Lymphocytes Relative: 11 % — ABNORMAL LOW (ref 12–46)
Lymphs Abs: 0.5 10*3/uL — ABNORMAL LOW (ref 0.7–4.0)
MCH: 28.6 pg (ref 26.0–34.0)
MCHC: 31.3 g/dL (ref 30.0–36.0)
MCV: 91.4 fL (ref 78.0–100.0)
MONO ABS: 0.8 10*3/uL (ref 0.1–1.0)
Monocytes Relative: 16 % — ABNORMAL HIGH (ref 3–12)
NEUTROS ABS: 3.6 10*3/uL (ref 1.7–7.7)
Neutrophils Relative %: 73 % (ref 43–77)
Platelets: 150 10*3/uL (ref 150–400)
RBC: 2.69 MIL/uL — ABNORMAL LOW (ref 3.87–5.11)
RDW: 18.6 % — ABNORMAL HIGH (ref 11.5–15.5)
WBC: 4.9 10*3/uL (ref 4.0–10.5)

## 2014-07-09 LAB — PROTIME-INR
INR: 1.6 — ABNORMAL HIGH (ref 0.00–1.49)
Prothrombin Time: 19.2 seconds — ABNORMAL HIGH (ref 11.6–15.2)

## 2014-07-09 LAB — PHOSPHORUS: PHOSPHORUS: 2.8 mg/dL (ref 2.3–4.6)

## 2014-07-09 LAB — CBC
HCT: 27.4 % — ABNORMAL LOW (ref 36.0–46.0)
Hemoglobin: 8.4 g/dL — ABNORMAL LOW (ref 12.0–15.0)
MCH: 27.7 pg (ref 26.0–34.0)
MCHC: 30.7 g/dL (ref 30.0–36.0)
MCV: 90.4 fL (ref 78.0–100.0)
Platelets: 99 10*3/uL — ABNORMAL LOW (ref 150–400)
RBC: 3.03 MIL/uL — ABNORMAL LOW (ref 3.87–5.11)
RDW: 18.6 % — AB (ref 11.5–15.5)
WBC: 2.6 10*3/uL — ABNORMAL LOW (ref 4.0–10.5)

## 2014-07-09 LAB — SAMPLE TO BLOOD BANK

## 2014-07-09 LAB — MAGNESIUM: Magnesium: 2.1 mg/dL (ref 1.5–2.5)

## 2014-07-09 LAB — LACTATE DEHYDROGENASE: LDH: 173 U/L (ref 94–250)

## 2014-07-09 LAB — AMMONIA: Ammonia: 17 umol/L (ref 11–32)

## 2014-07-09 LAB — PROCALCITONIN: PROCALCITONIN: 1.67 ng/mL

## 2014-07-09 LAB — LACTIC ACID, PLASMA: LACTIC ACID, VENOUS: 2.6 mmol/L — AB (ref 0.5–2.0)

## 2014-07-09 LAB — ETHANOL

## 2014-07-09 LAB — APTT: aPTT: 42 seconds — ABNORMAL HIGH (ref 24–37)

## 2014-07-09 LAB — STREP PNEUMONIAE URINARY ANTIGEN: Strep Pneumo Urinary Antigen: NEGATIVE

## 2014-07-09 LAB — URIC ACID: Uric Acid, Serum: 2.6 mg/dL (ref 2.4–7.0)

## 2014-07-09 MED ORDER — PIPERACILLIN-TAZOBACTAM 3.375 G IVPB 30 MIN
3.3750 g | Freq: Once | INTRAVENOUS | Status: AC
  Administered 2014-07-09: 3.375 g via INTRAVENOUS
  Filled 2014-07-09: qty 50

## 2014-07-09 MED ORDER — HEPARIN SODIUM (PORCINE) 5000 UNIT/ML IJ SOLN
5000.0000 [IU] | Freq: Three times a day (TID) | INTRAMUSCULAR | Status: DC
  Administered 2014-07-09 – 2014-07-12 (×8): 5000 [IU] via SUBCUTANEOUS
  Filled 2014-07-09 (×14): qty 1

## 2014-07-09 MED ORDER — EMTRICITABINE-TENOFOVIR DF 200-300 MG PO TABS
1.0000 | ORAL_TABLET | Freq: Every day | ORAL | Status: DC
  Administered 2014-07-09 – 2014-07-11 (×3): 1 via ORAL
  Filled 2014-07-09 (×5): qty 1

## 2014-07-09 MED ORDER — SODIUM CHLORIDE 0.9 % IV BOLUS (SEPSIS)
1000.0000 mL | Freq: Once | INTRAVENOUS | Status: AC
  Administered 2014-07-09: 1000 mL via INTRAVENOUS

## 2014-07-09 MED ORDER — SODIUM CHLORIDE 0.9 % IV SOLN
INTRAVENOUS | Status: DC
  Administered 2014-07-09: 11:00:00 via INTRAVENOUS

## 2014-07-09 MED ORDER — TRAZODONE HCL 50 MG PO TABS
50.0000 mg | ORAL_TABLET | Freq: Every day | ORAL | Status: DC
  Administered 2014-07-09 – 2014-07-11 (×3): 50 mg via ORAL
  Filled 2014-07-09 (×4): qty 1

## 2014-07-09 MED ORDER — VANCOMYCIN HCL 500 MG IV SOLR
500.0000 mg | Freq: Two times a day (BID) | INTRAVENOUS | Status: DC
  Administered 2014-07-09 – 2014-07-10 (×3): 500 mg via INTRAVENOUS
  Filled 2014-07-09 (×3): qty 500

## 2014-07-09 MED ORDER — DRONABINOL 5 MG PO CAPS
10.0000 mg | ORAL_CAPSULE | Freq: Two times a day (BID) | ORAL | Status: DC
  Administered 2014-07-09 – 2014-07-11 (×5): 10 mg via ORAL
  Filled 2014-07-09 (×3): qty 4
  Filled 2014-07-09 (×2): qty 2
  Filled 2014-07-09: qty 4

## 2014-07-09 MED ORDER — RITONAVIR 100 MG PO TABS
100.0000 mg | ORAL_TABLET | Freq: Every day | ORAL | Status: DC
  Administered 2014-07-09 – 2014-07-11 (×3): 100 mg via ORAL
  Filled 2014-07-09 (×5): qty 1

## 2014-07-09 MED ORDER — SODIUM CHLORIDE 0.9 % IV BOLUS (SEPSIS): 500.0000 mL | INTRAVENOUS | Status: DC

## 2014-07-09 MED ORDER — PANTOPRAZOLE SODIUM 40 MG IV SOLR: 40.0000 mg | Freq: Every day | INTRAVENOUS | Status: DC

## 2014-07-09 MED ORDER — OSELTAMIVIR PHOSPHATE 75 MG PO CAPS
75.0000 mg | ORAL_CAPSULE | Freq: Two times a day (BID) | ORAL | Status: DC
  Administered 2014-07-09 (×2): 75 mg via ORAL
  Filled 2014-07-09 (×4): qty 1

## 2014-07-09 MED ORDER — PANTOPRAZOLE SODIUM 40 MG PO TBEC
40.0000 mg | DELAYED_RELEASE_TABLET | Freq: Every day | ORAL | Status: DC
  Administered 2014-07-09: 40 mg via ORAL
  Filled 2014-07-09: qty 1

## 2014-07-09 MED ORDER — ATAZANAVIR SULFATE 150 MG PO CAPS
300.0000 mg | ORAL_CAPSULE | Freq: Every day | ORAL | Status: DC
  Administered 2014-07-09 – 2014-07-11 (×2): 300 mg via ORAL
  Filled 2014-07-09 (×6): qty 2

## 2014-07-09 MED ORDER — VANCOMYCIN HCL IN DEXTROSE 1-5 GM/200ML-% IV SOLN
1000.0000 mg | Freq: Once | INTRAVENOUS | Status: AC
  Administered 2014-07-09: 1000 mg via INTRAVENOUS
  Filled 2014-07-09: qty 200

## 2014-07-09 MED ORDER — BUPROPION HCL ER (XL) 150 MG PO TB24
150.0000 mg | ORAL_TABLET | Freq: Every day | ORAL | Status: DC
  Administered 2014-07-09 – 2014-07-11 (×3): 150 mg via ORAL
  Filled 2014-07-09 (×4): qty 1

## 2014-07-09 MED ORDER — SODIUM CHLORIDE 0.9 % IV SOLN
INTRAVENOUS | Status: DC
  Administered 2014-07-09 – 2014-07-10 (×3): via INTRAVENOUS

## 2014-07-09 MED ORDER — FAMOTIDINE 20 MG PO TABS: 40.0000 mg | ORAL_TABLET | Freq: Every day | ORAL | Status: DC

## 2014-07-09 MED ORDER — PIPERACILLIN-TAZOBACTAM 3.375 G IVPB
3.3750 g | Freq: Three times a day (TID) | INTRAVENOUS | Status: DC
  Administered 2014-07-09 – 2014-07-12 (×9): 3.375 g via INTRAVENOUS
  Filled 2014-07-09 (×9): qty 50

## 2014-07-09 NOTE — Progress Notes (Signed)
ANTIBIOTIC CONSULT NOTE - INITIAL  Pharmacy Consult for Vancomycin & Zosyn Indication: Sepsis  No Known Allergies  Patient Measurements: Height: 5\' 4"  (162.6 cm) Weight: 90 lb (40.824 kg) IBW/kg (Calculated) : 54.7  Vital Signs: Temp: 90 F (32.2 C) (03/14 0410) Temp Source: Rectal (03/14 0410) BP: 87/Allison mmHg (03/14 0545) Pulse Rate: 67 (03/14 0545) Intake/Output from previous day:   Intake/Output from this shift:    Labs:  Recent Labs  07/09/14 0429  WBC 4.9  HGB 7.7*  PLT 150  CREATININE 0.51   Estimated Creatinine Clearance: 66.8 mL/min (by C-G formula based on Cr of 0.51). No results for input(s): VANCOTROUGH, VANCOPEAK, VANCORANDOM, GENTTROUGH, GENTPEAK, GENTRANDOM, TOBRATROUGH, TOBRAPEAK, TOBRARND, AMIKACINPEAK, AMIKACINTROU, AMIKACIN in the last 72 hours.   Microbiology: No results found for this or any previous visit (from the past 720 hour(s)).  Medical History: Past Medical History  Diagnosis Date  . HIV (human immunodeficiency virus infection)   . Depression   . Anemia   . HSV (herpes simplex virus) infection   . History of shingles   . AIN III (anal intraepithelial neoplasia III)   . Condyloma acuminatum in [place]   . History of chronic bronchitis   . History of esophagitis     CANDIDA  . Periodontitis, chronic   . Hodgkin's lymphoma 06/12/2014  . Cancer     Hodgkin lymphoma    Medications:  Scheduled:   Infusions:  . piperacillin-tazobactam    . piperacillin-tazobactam (ZOSYN)  IV    . sodium chloride     Followed by  . sodium chloride    . vancomycin    . vancomycin     Assessment:  [name] with h/o HIV and newly diagnosed Hodgkins Lymphoma presents to ED with reported fever and shaking.  Temperature upon arrival to ED = 45F  Code Sepsis called  Pharmacy consulted to dose Vancomycin and Zosyn for sepsis  Respiratory virus panel, blood and urine cultures ordered  CrCl ~ 66 ml/min  Goal of Therapy:  Vancomycin trough level  15-20 mcg/ml  Plan:  Measure antibiotic drug levels at steady state Follow up culture results  Zosyn 3.375gm IV q8h (each dose infused over 4 hrs) Vancomycin 1g x 1 ordered in ED followed by 500mg  IV q12h  Everette Rank, PharmD 07/09/2014,6:16 AM

## 2014-07-09 NOTE — Progress Notes (Signed)
Lab VP called. Attempted to draw HIV labs x 2. First time pt aggitated and angry, hostile towards VP about not sticking in this place or that place, c/o soreness with arms but very limited to where to stick. Second VP atttempted but pt eating. Labs added to am labs.

## 2014-07-09 NOTE — ED Notes (Signed)
Bed: TC28 Expected date:  Expected time:  Means of arrival:  Comments: EMS CA pt fever

## 2014-07-09 NOTE — Progress Notes (Signed)
UR completed 

## 2014-07-09 NOTE — Progress Notes (Signed)
[name] for Infectious Disease  Date of Admission:  07/09/2014  Date of Consult:  07/09/2014  Reason for Consult: AIDS, Hodgkin's Lymphoma Referring Physician: Minor, Sood  Impression/Recommendation AIDS Hodgkin''s Lymphoma Pancytopenia Sepsis  await her Cx Restart her ART Have onc eval her Complete her lymphoma staging.   Comment- She is desirous of going home. Would try to get her to stay for 2 days until Cx -?  Thank you so much for this interesting consult,   [name] (pager) [phone] [email or web address]  [name] is an 29 y.o. female.  HPI: 29 yo F with hx of HIV/AIDS since 2006, previously on ATVr/TRV, was hospitalized January 2016 and found to have LAN, anemia and splenomegally. She had bx and was found to have hodgkin's lymphoma.  She was to start treatment for this but this has been delayed due to missed appts. She was last seen by h/o on 3-1.  She now comes to ED on 3-13 with fever, confusion and failure to thrive. She had temp 102, hypotensive in ED also found to have an elevated lactate. She is mildly pancytopenic. She was started on vanco/zosyn/tamiflu.   Head CT (-), CXR (-).   Denies missed ART. Denies LAN.   "I want to go home right now"  Past Medical History  Diagnosis Date  . HIV (human immunodeficiency virus infection)   . Depression   . Anemia   . HSV (herpes simplex virus) infection   . History of shingles   . AIN III (anal intraepithelial neoplasia III)   . Condyloma acuminatum in female   . History of chronic bronchitis   . History of esophagitis     CANDIDA  . Periodontitis, chronic   . Hodgkin's lymphoma 06/12/2014  . Cancer     Hodgkin lymphoma    Past Surgical History  Procedure Laterality Date  . Dilation and curettage of uterus  2005    MISSED AB  . Examination under anesthesia N/A 09/23/2012    Procedure: EXAM UNDER ANESTHESIA;  Surgeon: Adin Hector, MD;  Location: [facility];   Service: General;  Laterality: N/A;  . Laser ablation condolamata N/A 09/23/2012    Procedure: REMOVAL/ABLATION  ABLATION CONDOLAMATA WARTS;  Surgeon: Adin Hector, MD;  Location: [facility];  Service: General;  Laterality: N/A;     No Known Allergies  Medications:  Scheduled: . buPROPion  150 mg Oral QHS  . dronabinol  10 mg Oral BID AC  . heparin  5,000 Units Subcutaneous 3 times per day  . oseltamivir  75 mg Oral BID  . pantoprazole  40 mg Oral Daily  . piperacillin-tazobactam (ZOSYN)  IV  3.375 g Intravenous Q8H  . traZODone  50 mg Oral QHS  . vancomycin  500 mg Intravenous Q12H    Abtx:  Anti-infectives    Start     Dose/Rate Route Frequency Ordered Stop   07/09/14 2000  vancomycin (VANCOCIN) 500 mg in sodium chloride 0.9 % 100 mL IVPB     500 mg 100 mL/hr over 60 Minutes Intravenous Every 12 hours 07/09/14 0616     07/09/14 1500  piperacillin-tazobactam (ZOSYN) IVPB 3.375 g     3.375 g 12.5 mL/hr over 240 Minutes Intravenous Every 8 hours 07/09/14 0616     07/09/14 1300  oseltamivir (TAMIFLU) capsule 75 mg     75 mg Oral 2 times daily 07/09/14 1118 07/14/14 0959   07/09/14 0615  piperacillin-tazobactam (ZOSYN) IVPB 3.375 g  3.375 g 100 mL/hr over 30 Minutes Intravenous  Once 07/09/14 0603 07/09/14 1029   07/09/14 0615  vancomycin (VANCOCIN) IVPB 1000 mg/200 mL premix     1,000 mg 200 mL/hr over 60 Minutes Intravenous  Once 07/09/14 0603 07/09/14 0913      Total days of antibiotics 0 vanco/zosyn/tamiflu          Social History:  reports that she has been smoking Cigars.  She started smoking about 3 months ago. She has never used smokeless tobacco. She reports that she uses illicit drugs (Marijuana). She reports that she does not drink alcohol.  Family History  Problem Relation Age of Onset  . Cancer Maternal Aunt     unknown ca  . Cancer Maternal Grandmother     unknown ca    General ROS: no: sore throat, rhinorrhea, oral ulcers, cough,  sob, dysuria, diarrrhea. does not have port. see HPI.   Blood pressure 84/55, pulse 81, temperature 95.4 F (35.2 C), temperature source Oral, resp. rate 15, height _0  (1.626 m), weight 42 kg (92 lb 9.5 oz), SpO2 86 %. General appearance: alert, cooperative, cachectic and no distress Eyes: negative findings: conjunctivae and sclerae normal, pupils equal, round, reactive to light and accomodation and no photophobia Throat: lips, mucosa, and tongue normal; teeth and gums normal Neck: no adenopathy, supple, symmetrical, trachea midline and FROM Lungs: clear to auscultation bilaterally Heart: regular rate and rhythm Abdomen: normal findings: bowel sounds normal and soft, non-tender Extremities: edema none   Results for orders placed or performed during the hospital encounter of 07/09/14 (from the past 48 hour(s))  Comprehensive metabolic panel     Status: Abnormal   Collection Time: 07/09/14  4:29 AM  Result Value Ref Range   Sodium 132 (L) 135 - 145 mmol/L   Potassium 4.0 3.5 - 5.1 mmol/L   Chloride 96 96 - 112 mmol/L   CO2 23 19 - 32 mmol/L   Glucose, Bld 138 (H) 70 - 99 mg/dL   BUN 15 6 - 23 mg/dL   Creatinine, Ser 0.51 0.50 - 1.10 mg/dL   Calcium 8.3 (L) 8.4 - 10.5 mg/dL   Total Protein 6.6 6.0 - 8.3 g/dL   Albumin 1.8 (L) 3.5 - 5.2 g/dL   AST 38 (H) 0 - 37 U/L   ALT 41 (H) 0 - 35 U/L   Alkaline Phosphatase 145 (H) 39 - 117 U/L   Total Bilirubin 2.9 (H) 0.3 - 1.2 mg/dL   GFR calc non Af Amer >90 >90 mL/min   GFR calc Af Amer >90 >90 mL/min    Comment: (NOTE) The eGFR has been calculated using the CKD EPI equation. This calculation has not been validated in all clinical situations. eGFR's persistently <90 mL/min signify possible Chronic Kidney Disease.    Anion gap 13 5 - 15  CBC with Differential     Status: Abnormal   Collection Time: 07/09/14  4:29 AM  Result Value Ref Range   WBC 4.9 4.0 - 10.5 K/uL   RBC 2.69 (L) 3.87 - 5.11 MIL/uL   Hemoglobin 7.7 (L) 12.0 - 15.0  g/dL   HCT 24.6 (L) 36.0 - 46.0 %   MCV 91.4 78.0 - 100.0 fL   MCH 28.6 26.0 - 34.0 pg   MCHC 31.3 30.0 - 36.0 g/dL   RDW 18.6 (H) 11.5 - 15.5 %   Platelets 150 150 - 400 K/uL   Neutrophils Relative % 73 43 - 77 %   Lymphocytes Relative  11 (L) 12 - 46 %   Monocytes Relative 16 (H) 3 - 12 %   Eosinophils Relative 0 0 - 5 %   Basophils Relative 0 0 - 1 %   Neutro Abs 3.6 1.7 - 7.7 K/uL   Lymphs Abs 0.5 (L) 0.7 - 4.0 K/uL   Monocytes Absolute 0.8 0.1 - 1.0 K/uL   Eosinophils Absolute 0.0 0.0 - 0.7 K/uL   Basophils Absolute 0.0 0.0 - 0.1 K/uL   RBC Morphology STOMATOCYTES    WBC Morphology MILD LEFT SHIFT (1-5% METAS, OCC MYELO, OCC BANDS)     Comment: DOHLE BODIES   Smear Review LARGE PLATELETS PRESENT   Ammonia     Status: None   Collection Time: 07/09/14  5:09 AM  Result Value Ref Range   Ammonia 17 11 - 32 umol/L  Ethanol     Status: None   Collection Time: 07/09/14  5:09 AM  Result Value Ref Range   Alcohol, Ethyl (B) <5 0 - 9 mg/dL    Comment:        LOWEST DETECTABLE LIMIT FOR SERUM ALCOHOL IS 11 mg/dL FOR MEDICAL PURPOSES ONLY   Lactate dehydrogenase     Status: None   Collection Time: 07/09/14  5:09 AM  Result Value Ref Range   LDH 173 94 - 250 U/L  Uric acid     Status: None   Collection Time: 07/09/14  5:09 AM  Result Value Ref Range   Uric Acid, Serum 2.6 2.4 - 7.0 mg/dL  I-Stat CG4 Lactic Acid, ED     Status: Abnormal   Collection Time: 07/09/14  5:19 AM  Result Value Ref Range   Lactic Acid, Venous 5.31 (HH) 0.5 - 2.0 mmol/L   Comment NOTIFIED PHYSICIAN   Urinalysis, Routine w reflex microscopic     Status: Abnormal   Collection Time: 07/09/14  6:18 AM  Result Value Ref Range   Color, Urine YELLOW YELLOW   APPearance CLOUDY (A) CLEAR   Specific Gravity, Urine 1.017 1.005 - 1.030   pH 5.5 5.0 - 8.0   Glucose, UA NEGATIVE NEGATIVE mg/dL   Hgb urine dipstick NEGATIVE NEGATIVE   Bilirubin Urine NEGATIVE NEGATIVE   Ketones, ur NEGATIVE NEGATIVE mg/dL    Protein, ur NEGATIVE NEGATIVE mg/dL   Urobilinogen, UA 1.0 0.0 - 1.0 mg/dL   Nitrite NEGATIVE NEGATIVE   Leukocytes, UA TRACE (A) NEGATIVE  Urine microscopic-add on     Status: Abnormal   Collection Time: 07/09/14  6:18 AM  Result Value Ref Range   Squamous Epithelial / LPF RARE RARE   WBC, UA 0-2 <3 WBC/hpf   Bacteria, UA RARE RARE   Casts HYALINE CASTS (A) NEGATIVE    Comment: GRANULAR CAST  I-Stat CG4 Lactic Acid, ED     Status: Abnormal   Collection Time: 07/09/14  8:03 AM  Result Value Ref Range   Lactic Acid, Venous 3.44 (HH) 0.5 - 2.0 mmol/L   Comment NOTIFIED PHYSICIAN   Sample to Blood Bank     Status: None   Collection Time: 07/09/14  8:40 AM  Result Value Ref Range   Blood Bank Specimen SAMPLE AVAILABLE FOR TESTING    Sample Expiration 07/12/2014   Comprehensive metabolic panel     Status: Abnormal   Collection Time: 07/09/14  9:33 AM  Result Value Ref Range   Sodium 131 (L) 135 - 145 mmol/L   Potassium 3.0 (L) 3.5 - 5.1 mmol/L    Comment: DELTA CHECK NOTED REPEATED  TO VERIFY    Chloride 103 96 - 112 mmol/L   CO2 22 19 - 32 mmol/L   Glucose, Bld 125 (H) 70 - 99 mg/dL   BUN 14 6 - 23 mg/dL   Creatinine, Ser 0.35 (L) 0.50 - 1.10 mg/dL   Calcium 7.2 (L) 8.4 - 10.5 mg/dL   Total Protein 5.4 (L) 6.0 - 8.3 g/dL   Albumin 1.5 (L) 3.5 - 5.2 g/dL   AST 29 0 - 37 U/L   ALT 33 0 - 35 U/L   Alkaline Phosphatase 121 (H) 39 - 117 U/L   Total Bilirubin 1.8 (H) 0.3 - 1.2 mg/dL   GFR calc non Af Amer >90 >90 mL/min   GFR calc Af Amer >90 >90 mL/min    Comment: (NOTE) The eGFR has been calculated using the CKD EPI equation. This calculation has not been validated in all clinical situations. eGFR's persistently <90 mL/min signify possible Chronic Kidney Disease.    Anion gap 6 5 - 15  Magnesium     Status: None   Collection Time: 07/09/14  9:33 AM  Result Value Ref Range   Magnesium 2.1 1.5 - 2.5 mg/dL  Phosphorus     Status: None   Collection Time: 07/09/14  9:33 AM    Result Value Ref Range   Phosphorus 2.8 2.3 - 4.6 mg/dL  CBC     Status: Abnormal   Collection Time: 07/09/14  9:33 AM  Result Value Ref Range   WBC 2.6 (L) 4.0 - 10.5 K/uL   RBC 3.03 (L) 3.87 - 5.11 MIL/uL   Hemoglobin 8.4 (L) 12.0 - 15.0 g/dL   HCT 27.4 (L) 36.0 - 46.0 %   MCV 90.4 78.0 - 100.0 fL   MCH 27.7 26.0 - 34.0 pg   MCHC 30.7 30.0 - 36.0 g/dL   RDW 18.6 (H) 11.5 - 15.5 %   Platelets 99 (L) 150 - 400 K/uL    Comment: REPEATED TO VERIFY SPECIMEN CHECKED FOR CLOTS DELTA CHECK NOTED   Protime-INR     Status: Abnormal   Collection Time: 07/09/14  9:33 AM  Result Value Ref Range   Prothrombin Time 19.2 (H) 11.6 - 15.2 seconds   INR 1.60 (H) 0.00 - 1.49  APTT     Status: Abnormal   Collection Time: 07/09/14  9:33 AM  Result Value Ref Range   aPTT 42 (H) 24 - 37 seconds    Comment:        IF BASELINE aPTT IS ELEVATED, SUGGEST PATIENT RISK ASSESSMENT BE USED TO DETERMINE APPROPRIATE ANTICOAGULANT THERAPY.   Lactic acid, plasma     Status: Abnormal   Collection Time: 07/09/14  9:35 AM  Result Value Ref Range   Lactic Acid, Venous 2.6 (HH) 0.5 - 2.0 mmol/L    Comment: REPEATED TO VERIFY CRITICAL RESULT CALLED TO, READ BACK BY AND VERIFIED WITH: Pilar Jarvis [id number] @ 1040 BY J SCOTTON   Strep pneumoniae urinary antigen     Status: None   Collection Time: 07/09/14 10:00 AM  Result Value Ref Range   Strep Pneumo Urinary Antigen NEGATIVE NEGATIVE    Comment: PERFORMED AT [facility]        Infection due to S. pneumoniae cannot be absolutely ruled out since the antigen present may be below the detection limit of the test. Performed at [facility]   Urine rapid drug screen (hosp performed)     Status: Abnormal   Collection Time: 07/09/14 10:01 AM  Result Value Ref Range   Opiates NONE DETECTED NONE DETECTED   Cocaine NONE DETECTED NONE DETECTED   Benzodiazepines NONE DETECTED NONE DETECTED   Amphetamines NONE DETECTED NONE DETECTED    Tetrahydrocannabinol POSITIVE (A) NONE DETECTED   Barbiturates NONE DETECTED NONE DETECTED    Comment:        DRUG SCREEN FOR MEDICAL PURPOSES ONLY.  IF CONFIRMATION IS NEEDED FOR ANY PURPOSE, NOTIFY LAB WITHIN 5 DAYS.        LOWEST DETECTABLE LIMITS FOR URINE DRUG SCREEN Drug Class       Cutoff (ng/mL) Amphetamine      1000 Barbiturate      200 Benzodiazepine   785 Tricyclics       885 Opiates          300 Cocaine          300 THC              50       Component Value Date/Time   SDES STOOL 05/05/2014 0610   SDES STOOL 05/05/2014 0610   SDES STOOL 05/05/2014 0610   SPECREQUEST NONE 05/05/2014 0610   SPECREQUEST Immunocompromised 05/05/2014 0610   SPECREQUEST NONE 05/05/2014 0610   CULT  05/05/2014 0610    NO SALMONELLA, SHIGELLA, CAMPYLOBACTER, YERSINIA, OR E.COLI [id number] ISOLATED Performed at [facility] 05/08/2014 FINAL 05/05/2014 0610   REPTSTATUS 05/08/2014 FINAL 05/05/2014 0610   REPTSTATUS 05/08/2014 FINAL 05/05/2014 0610   Ct Head Wo Contrast  07/09/2014   CLINICAL DATA:  Altered mental status, hypothermia. HIV and non-Hodgkin's lymphoma.  EXAM: CT HEAD WITHOUT CONTRAST  TECHNIQUE: Contiguous axial images were obtained from the base of the skull through the vertex without intravenous contrast.  COMPARISON:  CT of the head Sep 17, 2006 and PET-CT July 02, 2014  FINDINGS: The ventricles and sulci are normal. No intraparenchymal hemorrhage, mass effect nor midline shift. No acute large vascular territory infarcts.  No abnormal extra-axial fluid collections. Basal cisterns are patent.  No skull fracture. The included ocular globes and orbital contents are non-suspicious. The mastoid aircells and included paranasal sinuses are well-aerated.  IMPRESSION: Normal noncontrast CT of the head.   Electronically Signed   By: Elon Alas   On: 07/09/2014 06:36   Dg Chest Port 1 View  07/09/2014   CLINICAL DATA:  Altered mental status.  History of  lymphoma.  EXAM: PORTABLE CHEST - 1 VIEW  COMPARISON:  05/04/2014  FINDINGS: A single AP portable view of the chest demonstrates no focal airspace consolidation or alveolar edema. The lungs are grossly clear. There is no large effusion or pneumothorax. Cardiac and mediastinal contours appear unremarkable.  IMPRESSION: No active disease.   Electronically Signed   By: Andreas Newport M.D.   On: 07/09/2014 06:30   No results found for this or any previous visit (from the past 240 hour(s)).    07/09/2014, 2:37 PM     LOS: 0 days

## 2014-07-09 NOTE — ED Notes (Signed)
Per pt's mother pt called for her to come into her room where she found the pt standing w/ her legs shaking.  Pt is alert to person and place only.  Speech is slurred.  Per pt's mother pt's mental status is altered at this time.  Pt is cold to the touch.

## 2014-07-09 NOTE — Progress Notes (Signed)
CSW received notification from RN Park Breed stating that pt insisting on leaving Against Medical Advice [facility]) and pt extremely loud and agitated at this time insisting on leaving. Per RN Park Breed, pt confusion waxes and wanes throughout the day and RN awaiting PA to assess pt at bedside to determine if IVC paperwork should be completed.   CSW drafted IVC paperwork and went to nursing unit. PA at bedside along with RN at this time. CSW spoke to RN upon exiting the room who reported that pt is agreeable to remain in hospital at this time and pt was oriented for PA at bedside.   CSW provided drafted IVC paperwork on pt chart at RN request. Pt currently agreeing to RN and PA that she will remain in the hospital at this time. Pt is currently not under IVC.   CSW to continue to follow to assist as appropriate.  Alison Murray, MSW, [place] [employer] [phone]

## 2014-07-09 NOTE — ED Notes (Signed)
Dr. Sharol Given is aware of pt's BP.  Dr. Sharol Given is at the bedside now attempting to obtain permission from pt start a central line as fluid is taking quite some time to infuse despite pressure bag and then placing fluid on a pump.

## 2014-07-09 NOTE — ED Notes (Signed)
Delay in lab draw, difficult stick

## 2014-07-09 NOTE — Progress Notes (Addendum)
Up to bathroom with family. RN instructed family and pt to call for help and not to get up out of bed without assistance. Pt repeatedly saying, "I'm leaving today, I'm going to be discharged. I'm not staying, nothing is wrong with me." Great Aunt in room consoling pt. Pt intermittently crying. Pt ordered tray, took 1 hour to get to floor then sit on desk x 15 minutes (due to isolation). Pt wanted tray warmed up then refused to eat it. Reordered another food item from dietary. Aunt spoke with this RN outside room and said this is not her, Justeen's, normal behavior usually she thinks it may have gone to her brain. Aunt also stated that Mom and Dad are unreasonable at times. Aunt states pt will tell mom things and mom will believe it. Aunt told me she is a witness to me speaking kind and explaining the current admission situation to [place]. MD text about pt wanting to sign out AMA.

## 2014-07-09 NOTE — ED Notes (Signed)
Pt was screaming at the top of her lungs that she did not want a temp foley placed.  Explained the purpose of the temp foley and pt remained adamant that she would not allow staff to insert one.  Dr. Sharol Given is aware.

## 2014-07-09 NOTE — ED Notes (Signed)
Otter MD unsuccessfully attempted to start a central line.

## 2014-07-09 NOTE — Progress Notes (Addendum)
While awaiting call from S. Minor NP called elink [phone] for Dr Summers. Pt insisting on leaving, telling this RN to get D/C papers ready. Pt received phone call from mom, Tammy, and started loudly crying and saying NO! NO! Dad and cousin in room comforting and consoling pt. Pt extremely agitated and getting loud at times insisting on discharge today. I feel IVC papers are needed if pt is to stay in hospital. (Sister visited for a few minutes and met cousin in hall. Cousin stated to sister that Arianah has been confused since her HIV diagnosis in 2005.) 

## 2014-07-09 NOTE — H&P (Signed)
PULMONARY / CRITICAL CARE MEDICINE   Name: [name] MRN: [id number] DOB: June 13, 1985    ADMISSION DATE:  07/09/2014   REFERRING MD :  EDP  CHIEF COMPLAINT:  Fever  INITIAL PRESENTATION:  Shock and decreased LOC  STUDIES:    SIGNIFICANT EVENTS:    HISTORY OF PRESENT ILLNESS:   29 yo AAF  Smoker(tobacco and marijuana) with a plethora of health issues, HIV + with undetectable viral load(follow by [facility]), new diagnosis of Hodgkin Lymphoma with chemo to start in one week(needs portacath) who presents to [facility] ED 3/13 with 3 days of fever up to 102, confusion and poor oral intake. No reports of N/V/D or sputum production,chills or sweats. Denies any ingestion of chemicals other than marijuana or exposure to sick individuals. Does complain of body aches and malaise. Found to have a lactate > 5, therefore PCCM will admit to ICU, hydrate, give abx, check viral panels and TB(for completeness).  PAST MEDICAL HISTORY :   has a past medical history of HIV (human immunodeficiency virus infection); Depression; Anemia; HSV (herpes simplex virus) infection; History of shingles; AIN III (anal intraepithelial neoplasia III); Condyloma acuminatum in female; History of chronic bronchitis; History of esophagitis; Periodontitis, chronic; Hodgkin's lymphoma (06/12/2014); and Cancer.  has past surgical history that includes Dilation and curettage of uterus (2005); Examination under anesthesia (N/A, 09/23/2012); and Laser ablation condolamata (N/A, 09/23/2012). Prior to Admission medications   Medication Sig Start Date End Date Taking? Authorizing Provider  atazanavir (REYATAZ) 300 MG capsule Take 1 capsule (300 mg total) by mouth daily with breakfast. TAKE WITH NORVIR 06/11/14  Yes Thayer Headings, MD  buPROPion (WELLBUTRIN XL) 150 MG 24 hr tablet Take 150 mg by mouth at bedtime.    Yes Historical Provider, MD  dronabinol (MARINOL) 10 MG capsule Take 1 capsule (10 mg total) by mouth 2 (two) times daily before  a meal. 06/12/14  Yes Thayer Headings, MD  emtricitabine-tenofovir (TRUVADA) 200-300 MG per tablet Take 1 tablet by mouth daily. 06/11/14  Yes Thayer Headings, MD  Naphazoline HCl (CLEAR EYES OP) Apply 2 drops to eye 2 (two) times daily.   Yes Historical Provider, MD  ritonavir (NORVIR) 100 MG TABS tablet Take 1 tablet (100 mg total) by mouth daily with breakfast. TAKE WITH REYATAZ 06/11/14  Yes Thayer Headings, MD  hydrocortisone cream 1 % Apply 1 application topically daily.    Historical Provider, MD  lidocaine-prilocaine (EMLA) cream Apply to affected area once 06/26/14   Heath Lark, MD  ondansetron (ZOFRAN) 8 MG tablet Take 1 tablet (8 mg total) by mouth 2 (two) times daily. Start the day after chemo for 3 days. Then take as needed for nausea or vomiting. 06/26/14   Heath Lark, MD  prochlorperazine (COMPAZINE) 10 MG tablet Take 1 tablet (10 mg total) by mouth every 6 (six) hours as needed (Nausea or vomiting). 06/26/14   Heath Lark, MD  traZODone (DESYREL) 50 MG tablet Take 50 mg by mouth at bedtime.    Historical Provider, MD   No Known Allergies  FAMILY HISTORY:  indicated that her mother is alive. She indicated that her father is alive.  SOCIAL HISTORY:  reports that she has been smoking Cigars.  She started smoking about 3 months ago. She has never used smokeless tobacco. She reports that she uses illicit drugs (Marijuana). She reports that she does not drink alcohol.  REVIEW OF SYSTEMS:  10 point review of system taken, please see HPI for positives  and negatives.   SUBJECTIVE:   VITAL SIGNS: Temp:  [90 F (32.2 C)] 90 F (32.2 C) (03/14 0626) Pulse Rate:  [61-96] 70 (03/14 0700) Resp:  [16] 16 (03/14 0626) BP: (80-114)/(46-74) 80/46 mmHg (03/14 0700) SpO2:  [94 %-100 %] 96 % (03/14 0700) Weight:  [90 lb (40.824 kg)] 90 lb (40.824 kg) (03/14 0410) HEMODYNAMICS:   VENTILATOR SETTINGS:   INTAKE / OUTPUT: No intake or output data in the 24 hours ending 07/09/14 0919  PHYSICAL  EXAMINATION: General:  Frail wasted, extensive body art and piercing  Neuro: Follows commands HEENT: Rt I J hematoma, rt supra clavicle mass Cardiovascular:  HSR RRR Lungs:  CTA Abdomen: Soft NT Musculoskeletal:  intact Skin: warm and dry  LABS:  CBC  Recent Labs Lab 07/09/14 0429  WBC 4.9  HGB 7.7*  HCT 24.6*  PLT 150   Coag's No results for input(s): APTT, INR in the last 168 hours. BMET  Recent Labs Lab 07/09/14 0429  NA 132*  K 4.0  CL 96  CO2 23  BUN 15  CREATININE 0.51  GLUCOSE 138*   Electrolytes  Recent Labs Lab 07/09/14 0429  CALCIUM 8.3*   Sepsis Markers  Recent Labs Lab 07/09/14 0519 07/09/14 0803  LATICACIDVEN 5.31* 3.44*   ABG No results for input(s): PHART, PCO2ART, PO2ART in the last 168 hours. Liver Enzymes  Recent Labs Lab 07/09/14 0429  AST 38*  ALT 41*  ALKPHOS 145*  BILITOT 2.9*  ALBUMIN 1.8*   Cardiac Enzymes No results for input(s): TROPONINI, PROBNP in the last 168 hours. Glucose No results for input(s): GLUCAP in the last 168 hours.  Imaging No results found.   ASSESSMENT / PLAN:  PULMONARY OETT  A: Smoker Chronic non prod cough P:   Check CxR O2 if needed  CARDIOVASCULAR CVL3/14 failed rt i j cvl attempt per EDP A:  Shock , presumed sepsis P:  IVF ABX Check cortisol(never ha chemo or steroids) Pressors as needed  RENAL  A:  No acute issue P:   Follow creatine  GASTROINTESTINAL A:   Malnourished P:   NPO till stabilized  HEMATOLOGIC A:   Lymphoma (hodgkin) Chronic anemia HIV ++ P:  Hemonc consult  INFECTIOUS A: Presumed infection in immunocompromised host P:   BCx2  3/14>> UC  3/14>. Sputum 3/14>> Resp viral panel 3/14>> TB 3/14>> Abx:  3/14 V>> 3/14 Z>> 3/14 tamiflu>> Continue antivirals for HIV Consider ID consult since they follow her   ENDOCRINE A:   No acute issue P:     NEUROLOGIC A:   Confusion(CT head neg) P:   RASS goal:1 Currently awake and  alert. Minimize sedatives    FAMILY  - Updates: Mother updated at bedside  - Inter-disciplinary family meet or Palliative Care meeting due by:  day 7    TODAY'S SUMMARY:  29 yo AAF  Smoker(tobacco and marijuana) with a plethora of health issues, HIV + with undetectable viral load(follow by [facility] ID), new diagnosis of Hodgkin Lymphoma with chemo to start in one week(needs portacath) who presents to [facility] ED 3/13 with 3 days of fever up to 102, confusion and poor oral intake. No reports of N/V/D or sputum production,chills or sweats. Denies any ingestion of chemicals other than marijuana or exposure to sick individuals. Does complain of body aches and malaise. Found to have a lactate > 5, therefore PCCM will admit to ICU, hydrate, give abx, check viral panels and TB(for completeness).  Richardson Landry Minor ACNP Maryanna Shape PCCM  Pager [phone] till 3 pm If no answer page [phone] 07/09/2014, 9:33 AM  Reviewed above, examined.  29 yo female with hx of HIV and Hodgkins lymphoma presented with fever102F.  She was hypotensive with elevated lactic acid in ER.  She denies sinus congestion, headache, sore throat, cough, sputum, chest pain, abdominal pain, nausea, vomiting, diarrhea, dysuria, or skin rash.  She is feeling better after fluid boluses.  No sinus tenderness, no oral exudate, no LAN, no wheeze, heart sounds regular, abd soft, no edema, no rashes.  Will continue IV fluids, and f/u lactic acid >> defer CVL placement for now.  Will continue vancomycin, zosyn, tamiflu for now.  Droplet isolation.  Will consult ID to assist with Abx and HIV management.  Chesley Mires, MD [facility] Pulmonary/Critical Care 07/09/2014, 11:53 AM Pager:  [phone] After 3pm call: [phone]

## 2014-07-09 NOTE — ED Notes (Signed)
Pt's mom Tammy Allred given 4 gold colored bractlets that pt had on her right arm.

## 2014-07-09 NOTE — ED Notes (Signed)
Critical care repaged.Marland Kitchenklj

## 2014-07-09 NOTE — Progress Notes (Signed)
CARE MANAGEMENT NOTE 07/09/2014  Patient:  [name], [name]   Account Number:  [id number]  Date Initiated:  07/09/2014  Documentation initiated by:  DAVIS,RHONDA  Subjective/Objective Assessment:   29 yo AAF plethora of health issues, HIV + w/undetectable viral load(follow by [facility] ID), new diagnosis of Hodgkin Lymphoma with chemo to start in one week/3 days of fever up to 102, confusion and poor oral intake. Lactate level of 5.     Action/Plan:   tbd   Anticipated DC Date:  07/12/2014   Anticipated DC Plan:  HOME/SELF CARE  In-house referral  Clinical Social Worker      DC Planning Services  CM consult      Choice offered to / List presented to:             Status of service:   Medicare Important Message given?   (If response is "NO", the following Medicare IM given date fields will be blank) Date Medicare IM given:   Medicare IM given by:   Date Additional Medicare IM given:   Additional Medicare IM given by:    Discharge Disposition:    Per UR Regulation:  Reviewed for med. necessity/level of care/duration of stay  If discussed at [place] of Stay Meetings, dates discussed:    Comments:  July 09, 2014/Rhonda L. Rosana Hoes, RN, BSN, CCM. Case Management [place] [phone] No discharge needs present of time of review.

## 2014-07-09 NOTE — ED Notes (Signed)
CG4+ Latic 3.44 reported to DR Sharol Given

## 2014-07-09 NOTE — Progress Notes (Signed)
Multi family members in room at this time and more arrived to visit. This RN reinforced again limited visitors, no more than 3 at a time since there were numerous visitors. [name] is in good spirits with food tray delivery. Upon tray arrival RN realized she had ordered multiple food items for herself and family members. All dividing up food. This RN instructed family they were not allowed to eat in room with droplet isolation. They asked where waiting room was and took items to eat out there. Reinforced again limited number allowed in pt's room.

## 2014-07-09 NOTE — ED Provider Notes (Signed)
CSN: [id number]     Arrival date & time 07/09/14  0355 History   First MD Initiated Contact with Patient 07/09/14 0415     Chief Complaint  Patient presents with  . Tremors     (Consider location/radiation/quality/duration/timing/severity/associated sxs/prior Treatment) HPI 29 year old female with history of HIV and newly diagnosed Hodgkin's lymphoma presents to the emergency department with 3 days of fever to 102.  Tonight, patient called her mother and reported that she was shaking.  Patient arrives and is hypothermic, confused.  Mother reports confusion started tonight.  Patient diagnosed with Hodgkin's lymphoma one month ago.  She has not yet started treatment.  Reading through telephone notes sounds.  There is been confusion about getting the workup leading to chemotherapy.  Last infectious disease notes report that her HIV disease is under well control with undetectable viral load.  Patient is underweight.  Mother does not know any further history.  Patient unable to give further history.  No reported cough, nausea, vomiting, diarrhea, abdominal pain, or rash. Past Medical History  Diagnosis Date  . HIV (human immunodeficiency virus infection)   . Depression   . Anemia   . HSV (herpes simplex virus) infection   . History of shingles   . AIN III (anal intraepithelial neoplasia III)   . Condyloma acuminatum in female   . History of chronic bronchitis   . History of esophagitis     CANDIDA  . Periodontitis, chronic   . Hodgkin's lymphoma 06/12/2014  . Cancer     Hodgkin lymphoma   Past Surgical History  Procedure Laterality Date  . Dilation and curettage of uterus  2005    MISSED AB  . Examination under anesthesia N/A 09/23/2012    Procedure: EXAM UNDER ANESTHESIA;  Surgeon: Adin Hector, MD;  Location: [place];  Service: General;  Laterality: N/A;  . Laser ablation condolamata N/A 09/23/2012    Procedure: REMOVAL/ABLATION  ABLATION CONDOLAMATA WARTS;   Surgeon: Adin Hector, MD;  Location: [place];  Service: General;  Laterality: N/A;   Family History  Problem Relation Age of Onset  . Cancer Maternal Aunt     unknown ca  . Cancer Maternal Grandmother     unknown ca   History  Substance Use Topics  . Smoking status: Current Every Day Smoker -- 0.00 packs/day for 7 years    Types: Cigars    Start date: 03/19/2014    Last Attempt to Quit: 03/19/2014  . Smokeless tobacco: Never Used     Comment: she smokes 3 Black and Mild Cigars daily  . Alcohol Use: No   OB History    Gravida Para Term Preterm AB TAB SAB Ectopic Multiple Living   3 0 0  2  1 1   0     Review of Systems  Level V caveat.  Patient confused  Allergies  Review of patient's allergies indicates no known allergies.  Home Medications   Prior to Admission medications   Medication Sig Start Date End Date Taking? Authorizing Provider  atazanavir (REYATAZ) 300 MG capsule Take 1 capsule (300 mg total) by mouth daily with breakfast. TAKE WITH NORVIR 06/11/14  Yes Thayer Headings, MD  buPROPion (WELLBUTRIN XL) 150 MG 24 hr tablet Take 150 mg by mouth at bedtime.    Yes Historical Provider, MD  dronabinol (MARINOL) 10 MG capsule Take 1 capsule (10 mg total) by mouth 2 (two) times daily before a meal. 06/12/14  Yes Okey Regal  Comer, MD  emtricitabine-tenofovir (TRUVADA) 200-300 MG per tablet Take 1 tablet by mouth daily. 06/11/14  Yes Thayer Headings, MD  Naphazoline HCl (CLEAR EYES OP) Apply 2 drops to eye 2 (two) times daily.   Yes Historical Provider, MD  ritonavir (NORVIR) 100 MG TABS tablet Take 1 tablet (100 mg total) by mouth daily with breakfast. TAKE WITH REYATAZ 06/11/14  Yes Thayer Headings, MD  hydrocortisone cream 1 % Apply 1 application topically daily.    Historical Provider, MD  lidocaine-prilocaine (EMLA) cream Apply to affected area once 06/26/14   Heath Lark, MD  ondansetron (ZOFRAN) 8 MG tablet Take 1 tablet (8 mg total) by mouth 2 (two) times  daily. Start the day after chemo for 3 days. Then take as needed for nausea or vomiting. 06/26/14   Heath Lark, MD  prochlorperazine (COMPAZINE) 10 MG tablet Take 1 tablet (10 mg total) by mouth every 6 (six) hours as needed (Nausea or vomiting). 06/26/14   Heath Lark, MD  traZODone (DESYREL) 50 MG tablet Take 50 mg by mouth at bedtime.    Historical Provider, MD   BP 106/74 mmHg  Pulse 61  Temp(Src) 90 F (32.2 C) (Rectal)  Resp 16  Ht 5\' 4"  (1.626 m)  Wt 90 lb (40.824 kg)  BMI 15.44 kg/m2  SpO2 100% Physical Exam  Constitutional: She appears well-developed and well-nourished.  Patient is confused, extremely underweight ill-appearing  HENT:  Head: Normocephalic and atraumatic.  Right Ear: External ear normal.  Left Ear: External ear normal.  Nose: Nose normal.  Mouth/Throat: Oropharynx is clear and moist.  Eyes: Conjunctivae and EOM are normal. Pupils are equal, round, and reactive to light.  Neck: Normal range of motion. Neck supple. No JVD present. No tracheal deviation present. No thyromegaly present.  Cardiovascular: Regular rhythm, normal heart sounds and intact distal pulses.  Exam reveals no gallop and no friction rub.   No murmur heard. Tachycardia  Pulmonary/Chest: Effort normal and breath sounds normal. No stridor. No respiratory distress. She has no wheezes. She has no rales. She exhibits no tenderness.  Abdominal: Soft. Bowel sounds are normal. She exhibits no distension and no mass. There is no tenderness. There is no rebound and no guarding.  Musculoskeletal: Normal range of motion. She exhibits no edema or tenderness.  Lymphadenopathy:    She has no cervical adenopathy.  Neurological: She is alert. She displays normal reflexes. She exhibits normal muscle tone. Coordination normal.  Patient notes that she is in the hospital.  She does not know the year or month.  Skin: Skin is warm and dry. No rash noted. No erythema. No pallor.    ED Course  CENTRAL LINE Date/Time:  07/09/2014 7:50 AM Performed by: Linton Flemings Authorized by: Linton Flemings Consent: Verbal consent obtained. Written consent obtained. Risks and benefits: risks, benefits and alternatives were discussed Consent given by: patient Patient understanding: patient states understanding of the procedure being performed Patient consent: the patient's understanding of the procedure matches consent given Procedure consent: procedure consent matches procedure scheduled Site marked: the operative site was marked Imaging studies: imaging studies available Required items: required blood products, implants, devices, and special equipment available Time out: Immediately prior to procedure a "time out" was called to verify the correct patient, procedure, equipment, support staff and site/side marked as required. Indications: vascular access Anesthesia: local infiltration Local anesthetic: lidocaine 1% with epinephrine Anesthetic total: 2 ml Patient sedated: no Preparation: skin prepped with ChloraPrep Skin prep agent dried: skin prep  agent completely dried prior to procedure Sterile barriers: all five maximum sterile barriers used - cap, mask, sterile gown, sterile gloves, and large sterile sheet Hand hygiene: hand hygiene performed prior to central venous catheter insertion Location details: right internal jugular Patient position: Trendelenburg Catheter type: triple lumen Catheter size: 7 Fr Pre-procedure: landmarks identified Ultrasound guidance: yes Sterile ultrasound techniques: sterile gel and sterile probe covers were used Number of attempts: 5 or more Successful placement: no   (including critical care time) Labs Review Labs Reviewed  COMPREHENSIVE METABOLIC PANEL - Abnormal; Notable for the following:    Sodium 132 (*)    Glucose, Bld 138 (*)    Calcium 8.3 (*)    Albumin 1.8 (*)    AST 38 (*)    ALT 41 (*)    Alkaline Phosphatase 145 (*)    Total Bilirubin 2.9 (*)    All other  components within normal limits  CBC WITH DIFFERENTIAL/PLATELET - Abnormal; Notable for the following:    RBC 2.69 (*)    Hemoglobin 7.7 (*)    HCT 24.6 (*)    RDW 18.6 (*)    Lymphocytes Relative 11 (*)    Monocytes Relative 16 (*)    Lymphs Abs 0.5 (*)    All other components within normal limits  URINALYSIS, ROUTINE W REFLEX MICROSCOPIC - Abnormal; Notable for the following:    APPearance CLOUDY (*)    Leukocytes, UA TRACE (*)    All other components within normal limits  URINE MICROSCOPIC-ADD ON - Abnormal; Notable for the following:    Casts HYALINE CASTS (*)    All other components within normal limits  I-STAT CG4 LACTIC ACID, ED - Abnormal; Notable for the following:    Lactic Acid, Venous 5.31 (*)    All other components within normal limits  I-STAT CG4 LACTIC ACID, ED - Abnormal; Notable for the following:    Lactic Acid, Venous 3.44 (*)    All other components within normal limits  CULTURE, BLOOD (ROUTINE X 2)  CULTURE, BLOOD (ROUTINE X 2)  URINE CULTURE  AMMONIA  ETHANOL  LACTATE DEHYDROGENASE  URIC ACID  SAMPLE TO BLOOD BANK    Imaging Review Ct Head Wo Contrast  07/09/2014   CLINICAL DATA:  Altered mental status, hypothermia. HIV and non-Hodgkin's lymphoma.  EXAM: CT HEAD WITHOUT CONTRAST  TECHNIQUE: Contiguous axial images were obtained from the base of the skull through the vertex without intravenous contrast.  COMPARISON:  CT of the head Sep 17, 2006 and PET-CT July 02, 2014  FINDINGS: The ventricles and sulci are normal. No intraparenchymal hemorrhage, mass effect nor midline shift. No acute large vascular territory infarcts.  No abnormal extra-axial fluid collections. Basal cisterns are patent.  No skull fracture. The included ocular globes and orbital contents are non-suspicious. The mastoid aircells and included paranasal sinuses are well-aerated.  IMPRESSION: Normal noncontrast CT of the head.   Electronically Signed   By: Elon Alas   On: 07/09/2014  06:36   Dg Chest Port 1 View  07/09/2014   CLINICAL DATA:  Altered mental status.  History of lymphoma.  EXAM: PORTABLE CHEST - 1 VIEW  COMPARISON:  05/04/2014  FINDINGS: A single AP portable view of the chest demonstrates no focal airspace consolidation or alveolar edema. The lungs are grossly clear. There is no large effusion or pneumothorax. Cardiac and mediastinal contours appear unremarkable.  IMPRESSION: No active disease.   Electronically Signed   By: Andreas Newport M.D.   On:  07/09/2014 06:30     EKG Interpretation None     CRITICAL CARE Performed by: Kalman Drape Total critical care time: 90 min Critical care time was exclusive of separately billable procedures and treating other patients. Critical care was necessary to treat or prevent imminent or life-threatening deterioration. Critical care was time spent personally by me on the following activities: development of treatment plan with patient and/or surrogate as well as nursing, discussions with consultants, evaluation of patient's response to treatment, examination of patient, obtaining history from patient or surrogate, ordering and performing treatments and interventions, ordering and review of laboratory studies, ordering and review of radiographic studies, pulse oximetry and re-evaluation of patient's condition. MDM   Final diagnoses:  Altered mental state  Hypothermia  Rigors  Other specified hypotension  Sepsis, due to unspecified organism  Hodgkin's lymphoma  HIV (human immunodeficiency virus infection)  Abnormal liver function tests  Underweight    29 year old female with hypothermia, concern for sepsis with acute altered mental status and several days of fever.  Unknown source.  Lactate elevated, blood pressure now beginning to trend downward.  Will discuss with critical care, broad-spectrum antibiotics and need for admission.  7:48 AM Patient with dropping blood pressures, unable to get full fluid  resuscitation bolus through peripheral IVs.  Patient consented for central line.  Using ultrasound-guided technique, able to visualize the right internal jugular.  Due to patient compliance and body habitus, unable to get good flash and confirm placement.  The seizure was aborted.  During attempted placement of the central line, patient became agitated and we have lost peripheral IV access.  Will discuss with critical care.  Further help in getting IV access.  Repeat lactate sent.  Linton Flemings, MD 07/09/14 [phone]

## 2014-07-10 ENCOUNTER — Encounter (HOSPITAL_COMMUNITY): Payer: Self-pay

## 2014-07-10 ENCOUNTER — Ambulatory Visit: Payer: Self-pay

## 2014-07-10 DIAGNOSIS — B20 Human immunodeficiency virus [HIV] disease: Secondary | ICD-10-CM

## 2014-07-10 DIAGNOSIS — R945 Abnormal results of liver function studies: Secondary | ICD-10-CM

## 2014-07-10 DIAGNOSIS — Z21 Asymptomatic human immunodeficiency virus [HIV] infection status: Secondary | ICD-10-CM

## 2014-07-10 DIAGNOSIS — A419 Sepsis, unspecified organism: Principal | ICD-10-CM

## 2014-07-10 DIAGNOSIS — E43 Unspecified severe protein-calorie malnutrition: Secondary | ICD-10-CM | POA: Insufficient documentation

## 2014-07-10 DIAGNOSIS — E872 Acidosis: Secondary | ICD-10-CM

## 2014-07-10 DIAGNOSIS — R6521 Severe sepsis with septic shock: Secondary | ICD-10-CM

## 2014-07-10 DIAGNOSIS — D630 Anemia in neoplastic disease: Secondary | ICD-10-CM

## 2014-07-10 DIAGNOSIS — C8199 Hodgkin lymphoma, unspecified, extranodal and solid organ sites: Secondary | ICD-10-CM

## 2014-07-10 DIAGNOSIS — Z9119 Patient's noncompliance with other medical treatment and regimen: Secondary | ICD-10-CM

## 2014-07-10 DIAGNOSIS — IMO0001 Reserved for inherently not codable concepts without codable children: Secondary | ICD-10-CM | POA: Insufficient documentation

## 2014-07-10 DIAGNOSIS — D61818 Other pancytopenia: Secondary | ICD-10-CM

## 2014-07-10 LAB — BASIC METABOLIC PANEL
Anion gap: 10 (ref 5–15)
BUN: 12 mg/dL (ref 6–23)
CO2: 19 mmol/L (ref 19–32)
Calcium: 7.4 mg/dL — ABNORMAL LOW (ref 8.4–10.5)
Chloride: 103 mmol/L (ref 96–112)
Creatinine, Ser: 0.6 mg/dL (ref 0.50–1.10)
GFR calc Af Amer: >90 mL/min (ref >90)
GLUCOSE: 101 mg/dL — AB (ref 70–99)
POTASSIUM: 3.8 mmol/L (ref 3.5–5.1)
Sodium: 132 mmol/L — ABNORMAL LOW (ref 135–145)

## 2014-07-10 LAB — CBC
HCT: 19.1 % — ABNORMAL LOW (ref 36.0–46.0)
Hemoglobin: 5.9 g/dL — CL (ref 12.0–15.0)
MCH: 28.1 pg (ref 26.0–34.0)
MCHC: 30.9 g/dL (ref 30.0–36.0)
MCV: 91 fL (ref 78.0–100.0)
Platelets: 186 10*3/uL (ref 150–400)
RBC: 2.1 MIL/uL — ABNORMAL LOW (ref 3.87–5.11)
RDW: 19.2 % — AB (ref 11.5–15.5)
WBC: 4.5 10*3/uL (ref 4.0–10.5)

## 2014-07-10 LAB — CORTISOL: Cortisol, Plasma: 22.3 ug/dL

## 2014-07-10 LAB — URINE CULTURE: Colony Count: 50000

## 2014-07-10 LAB — LEGIONELLA ANTIGEN, URINE

## 2014-07-10 LAB — VANCOMYCIN, TROUGH: Vancomycin Tr: 5.4 ug/mL — ABNORMAL LOW (ref 10.0–20.0)

## 2014-07-10 MED ORDER — VANCOMYCIN HCL IN DEXTROSE 750-5 MG/150ML-% IV SOLN
750.0000 mg | Freq: Three times a day (TID) | INTRAVENOUS | Status: DC
  Administered 2014-07-11 – 2014-07-12 (×4): 750 mg via INTRAVENOUS
  Filled 2014-07-10 (×7): qty 150

## 2014-07-10 MED ORDER — SODIUM CHLORIDE 0.9 % IV SOLN
Freq: Once | INTRAVENOUS | Status: AC
  Administered 2014-07-10: 17:00:00 via INTRAVENOUS

## 2014-07-10 MED ORDER — SODIUM CHLORIDE 0.9 % IV BOLUS (SEPSIS)
1000.0000 mL | Freq: Once | INTRAVENOUS | Status: AC
  Administered 2014-07-11: 1000 mL via INTRAVENOUS

## 2014-07-10 MED ORDER — ACETAMINOPHEN 325 MG PO TABS
650.0000 mg | ORAL_TABLET | ORAL | Status: DC | PRN
  Administered 2014-07-10: 650 mg via ORAL
  Filled 2014-07-10: qty 2

## 2014-07-10 NOTE — Progress Notes (Signed)
CRITICAL VALUE ALERT  Critical value received:  Hgb 5.9  Date of notification:  07/10/14  Time of notification: 0727  Critical value read back:Yes.    Nurse who received alert:  Irene Pap  MD notified (1st page):  Rama, C.  Time of first page:  0733  MD notified (2nd page):  Time of second page:  Responding MD:  Rama, ( put in orders )  Time MD responded:  [phone]

## 2014-07-10 NOTE — Progress Notes (Signed)
ANTIBIOTIC CONSULT NOTE - FOLLOW UP  Pharmacy Consult for Vancomycin/Zosyn Indication: rule out sepsis  No Known Allergies  Patient Measurements: Height: 5\' 4"  (162.6 cm) Weight: 103 lb 9.6 oz (46.993 kg) IBW/kg (Calculated) : 54.7  Vital Signs: Temp: 99.4 F (37.4 C) (03/15 0800) Temp Source: Oral (03/15 0800) BP: 91/43 mmHg (03/15 0900) Pulse Rate: 95 (03/15 0900) Intake/Output from previous day: 03/14 0701 - 03/15 0700 In: 2561.7 [P.O.:480; I.V.:1831.7; IV Piggyback:250] Out: 1000 [Urine:1000] Intake/Output from this shift: Total I/O In: 100 [I.V.:100] Out: -   Labs:  Recent Labs  07/09/14 0429 07/09/14 0933 07/10/14 0400 07/10/14 0656  WBC 4.9 2.6*  --  4.5  HGB 7.7* 8.4*  --  5.9*  PLT 150 99*  --  186  CREATININE 0.51 0.35* 0.60  --    Estimated Creatinine Clearance: 77 mL/min (by C-G formula based on Cr of 0.6). No results for input(s): VANCOTROUGH, VANCOPEAK, VANCORANDOM, GENTTROUGH, GENTPEAK, GENTRANDOM, TOBRATROUGH, TOBRAPEAK, TOBRARND, AMIKACINPEAK, AMIKACINTROU, AMIKACIN in the last 72 hours.    Assessment: 15 female with h/o HIV and newly diagnosed Hodgkins Lymphoma presents to ED with reported fever and shaking. Code Sepsis called.  ID consulted.  Pharmacy consulted to dose vancomycin and zosyn.  3/14 >>vanc >> 3/14 >>zosyn >>  3/14 >> tamfilu >> 3/15  Tmax: 100.2 (90 F on admission) WBCs: 4.5 Renal: Scr = 0.60 with CrCl ~ 99 ml/min PCT 1.67 LA 5.31 > 2.6  3/14 blood: ngtd 3/14 urine: sent 3/14 respiratory virus panel: IP 3/14 influenza: neg 3/14 Quant tb gold: IP 3/14 strep pneumo/legionella ur ag: neg/IP 3/15 HIV RNA quant: IP 3/15 CD4: IP  Today is day #2 Vanc 1gm x 1 then 500mg  IV q12h and Zosyn EI for sepsis.  ID resumed PTA HAART.    Goal of Therapy:  Vancomycin trough level 15-20 mcg/ml  Doses adjusted per renal function Eradication of infection  Plan:  1.  Check vancomycin trough tonight.  Patient has low weight but  young with good renal function so may need q8h dosing.  Will adjust dose based on level. 2.  Continue Zosyn 3.375g IV q8h (4 hour infusion time).  3.  F/u culture results and ID recommendations.  Hershal Coria 07/10/2014,11:16 AM

## 2014-07-10 NOTE — Progress Notes (Signed)
ANTIBIOTIC CONSULT NOTE - FOLLOW UP  Pharmacy Consult for Vancomycin/Zosyn Indication: rule out sepsis  No Known Allergies  Patient Measurements: Height: 5\' 4"  (162.6 cm) Weight: 103 lb 9.6 oz (46.993 kg) IBW/kg (Calculated) : 54.7  Vital Signs: Temp: 98.8 F (37.1 C) (03/15 1945) Temp Source: Oral (03/15 1945) BP: 82/41 mmHg (03/15 1952) Pulse Rate: 88 (03/15 1952) Intake/Output from previous day: 03/14 0701 - 03/15 0700 In: 2561.7 [P.O.:480; I.V.:1831.7; IV Piggyback:250] Out: 1000 [Urine:1000] Intake/Output from this shift: Total I/O In: 257.5 [I.V.:100; Blood:157.5] Out: 250 [Urine:250]  Labs:  Recent Labs  07/09/14 0429 07/09/14 0933 07/10/14 0400 07/10/14 0656  WBC 4.9 2.6*  --  4.5  HGB 7.7* 8.4*  --  5.9*  PLT 150 99*  --  186  CREATININE 0.51 0.35* 0.60  --    Estimated Creatinine Clearance: 77 mL/min (by C-G formula based on Cr of 0.6).  Recent Labs  07/10/14 1925  VANCOTROUGH 5.4*      Assessment: 89 female with h/o HIV and newly diagnosed Hodgkins Lymphoma presents to ED with reported fever and shaking. Code Sepsis called.  ID consulted.  Pharmacy consulted to dose vancomycin and zosyn.  3/14 >>vanc >> 3/14 >>zosyn >>  3/14 >> tamfilu >> 3/15  Tmax: 100.2 (89 F on admission) WBCs: 4.5 Renal: Scr = 0.60 with CrCl ~ 99 ml/min PCT 1.67 LA 5.31 > 2.6  3/14 blood: ngtd 3/14 urine: sent 3/14 respiratory virus panel: IP 3/14 influenza: neg 3/14 Quant tb gold: IP 3/14 strep pneumo/legionella ur ag: neg/IP 3/15 HIV RNA quant: IP 3/15 CD4: IP  Today is day #2 Vanc 1gm x 1 then 500mg  IV q12h and Zosyn EI for sepsis.  ID resumed PTA HAART.   1925 VT=5.4 mg/L  Goal of Therapy:  Vancomycin trough level 15-20 mcg/ml  Doses adjusted per renal function Eradication of infection  Plan:  1.  Increase Vancomycin to 750mg  IV q8h 2.  Continue Zosyn 3.375g IV q8h (4 hour infusion time).  3.  F/u culture results and ID  recommendations.  Lawana Pai R 07/10/2014,11:23 PM

## 2014-07-10 NOTE — Progress Notes (Signed)
Progress Note   [name] BWI:[id number] DOB: 1985-11-02 DOA: 07/09/2014 PCP: Scharlene Gloss, MD   Brief Narrative:   [name] is an 29 y.o. female with a PMH of HIV/AIDS diagnosed 2006, previously on ATVr/TRV, hospitalized 1/16 and found to have lymphadenopathy, anemia and splenomegally, status post biopsy with findings consistent with Hodgkin's lymphoma. She saw Dr. Alvy Bimler for treatment recommendations, but treatment has been delayed due to missed appts. She was last seen 06/26/14 with plans to start chemotherapy next week. She was admitted 07/08/14 with fever, confusion and failure to thrive. In the ED, noted to have a temperature of 10 72F, hypotension with elevated lactate. She was mildly pancytopenic. She was admitted by PCCM and started on vanco/zosyn/tamiflu. ID consultation was performed by Dr. Johnnye Sima. [facility] was asked to assume care of this patient on 07/10/14.  Assessment/Plan:   Principal Problem:   Severe sepsis with septic shock/hypotension/lactic acidosis - Currently being managed with empiric vancomycin, Zosyn and Tamiflu pending culture data. - Influenza screen negative. Discontinue Tamiflu. - Respiratory virus panel and Legionella urinary antigen pending. - Strep pneumonia urinary antigen negative.  - Follow-up blood cultures and urine culture.  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 - Undetectable viral load. - F/U CD4 & VL.    Depression - Continue Wellbutrin.     Chronic periodontitis - On empiric antibiotics.    Underweight - Continue Marinol.    AIN III (anal intraepithelial neoplasia III) - Follow-up OB/GYN.    Myalgia and myositis - Suggests either a viral illness or possibly related to lymphoma.    Abnormal liver function tests - LFTs trending down, likely from shock liver.    Anemia in chronic illness - Hemoglobin  5.9 this morning, will give 2 units of irradiated blood.    Hodgkin's lymphoma / pancytopenia - Spoke with Dr. Alvy Bimler, who will see the patient in consultation.    Confusion - CT head negative.    DVT Prophylaxis - Continue heparin.  Code Status: Full. Family Communication: Female visitor asleep in room. Disposition Plan: Home when stable.   IV Access:    Peripheral IV   Procedures and diagnostic studies:   Ct Head Wo Contrast 07/09/2014: Normal noncontrast CT of the head.   Electronically Signed   By: Elon Alas   On: 07/09/2014 06:36   Dg Chest Port 1 View 07/09/2014: No active disease.    Medical Consultants:    Dr. [name], ID  Dr. Heath Lark, Oncology  Anti-Infectives:    Vancomycin 07/09/14--->  Zosyn 07/09/14--->  Tamiflu 07/09/14---> 07/10/14  Subjective:   [name] denies dyspnea and cough. Denies pain, nausea and vomiting. States she wants to go home.  Objective:    Filed Vitals:   07/10/14 0200 07/10/14 0300 07/10/14 0400 07/10/14 0500  BP: 88/46 86/49 88/48    Pulse: 94 95 53   Temp:      TempSrc:      Resp: 32 34 22   Height:      Weight:    46.993 kg (103 lb 9.6 oz)  SpO2: 100% 100% 92%     Intake/Output Summary (Last 24 hours) at 07/10/14 0733 Last data filed at 07/10/14 0400  Gross per 24 hour  Intake 2211.67 ml  Output    700 ml  Net 1511.67 ml    Exam: Gen:  NAD Cardiovascular:  RRR, No  M/R/G Respiratory:  Lungs CTAB Gastrointestinal:  Abdomen soft, NT/ND, + BS Extremities:  No C/E/C   Data Reviewed:    Labs: Basic Metabolic Panel:  Recent Labs Lab 07/09/14 0429 07/09/14 0933 07/10/14 0400  NA 132* 131* 132*  K 4.0 3.0* 3.8  CL 96 103 103  CO2 23 22 19   GLUCOSE 138* 125* 101*  BUN 15 14 12   CREATININE 0.51 0.35* 0.60  CALCIUM 8.3* 7.2* 7.4*  MG  --  2.1  --   PHOS  --  2.8  --    GFR Estimated Creatinine Clearance: 77 mL/min (by C-G formula based on Cr of 0.6). Liver Function  Tests:  Recent Labs Lab 07/09/14 0429 07/09/14 0933  AST 38* 29  ALT 41* 33  ALKPHOS 145* 121*  BILITOT 2.9* 1.8*  PROT 6.6 5.4*  ALBUMIN 1.8* 1.5*    Recent Labs Lab 07/09/14 0509  AMMONIA 17   Coagulation profile  Recent Labs Lab 07/09/14 0933  INR 1.60*    CBC:  Recent Labs Lab 07/09/14 0429 07/09/14 0933 07/10/14 0656  WBC 4.9 2.6* 4.5  NEUTROABS 3.6  --   --   HGB 7.7* 8.4* 5.9*  HCT 24.6* 27.4* 19.1*  MCV 91.4 90.4 91.0  PLT 150 99* 186   Sepsis Labs:  Recent Labs Lab 07/09/14 0429 07/09/14 0519 07/09/14 0803 07/09/14 0933 07/09/14 0935 07/10/14 0656  PROCALCITON  --   --   --  1.67  --   --   WBC 4.9  --   --  2.6*  --  4.5  LATICACIDVEN  --  5.31* 3.44*  --  2.6*  --    Microbiology No results found for this or any previous visit (from the past 240 hour(s)).   Medications:   . atazanavir  300 mg Oral Q breakfast  . buPROPion  150 mg Oral QHS  . dronabinol  10 mg Oral BID AC  . emtricitabine-tenofovir  1 tablet Oral Daily  . famotidine  40 mg Oral QHS  . heparin  5,000 Units Subcutaneous 3 times per day  . piperacillin-tazobactam (ZOSYN)  IV  3.375 g Intravenous Q8H  . ritonavir  100 mg Oral Q breakfast  . traZODone  50 mg Oral QHS  . vancomycin  500 mg Intravenous Q12H   Continuous Infusions: . sodium chloride 100 mL/hr at 07/10/14 0520    Time spent: 35 minutes.  The patient is medically complex and requires high complexity decision making.    LOS: 1 day   RAMA,CHRISTINA  [facility] Pager [phone]. If unable to reach me by pager, please call my cell phone at [phone].  *Please refer to [email or web address], password [email or web address] to get updated schedule on who will round on this patient, as hospitalists switch teams weekly. If 7PM-7AM, please contact night-coverage at [email or web address], password [email or web address] for any overnight needs.  07/10/2014, 7:33 AM

## 2014-07-10 NOTE — Progress Notes (Signed)
[name]   DOB:[birth year]   ID#:[id number]   RWE#:[id number]  Patient Care Team: Thayer Headings, MD as PCP - General (Internal Medicine) Thayer Headings, MD as PCP - Infectious Diseases (Infectious Diseases) Woodroe Mode, MD as Consulting Physician (Obstetrics and Gynecology) Heath Lark, MD as Consulting Physician (Hematology and Oncology)  I have seen the patient, examined her and edited the notes as follows  Subjective: 29 year old woman with a recent diagnosis on Hodgkin's lymphoma, not yet on treatment due to non compliance with her appointments, admitted on 3/13 with fever up to 102 F,chills, acute confusion (now resolved), myalgias and failure to thrive. She denies any night sweats, vision changes, or mucositis. Denies any respiratory complaints. Denies any chest pain or palpitations. Denies lower extremity swelling. Denies nausea, heartburn or change in bowel habits. Denies any dysuria. Denies abnormal skin rashes, or neuropathy. Denies any bleeding issues such as epistaxis, hematemesis, hematuria or hematochezia. She was diagnosed with septic shock in the setting of hypotension. CT head and chest x ray were negative. Influenza screen and Strep pneumonia in urine are negative. Respiratory virus panel and Legionella urinary antigen are pending. Cultures are pending. She had mild pancytopenia. She was placed on Vanco/ Zosyn IV as recommended by Infectious Diseases. We were kindly informed of the patient's admission. After IV fluid resuscitation, her blood pressure improved but the patient is subsequently found to have severe anemia. She is waiting for blood transfusion. She felt a bit better.   SUMMARY OF ONCOLOGIC HISTORY:   Hodgkin's lymphoma   05/06/2014 Imaging CT scan of the abdomen show diffuse mesenteric lymphadenopathy.   05/07/2014 Imaging CT scan of the chest show right thoracic inlet lymphadenopathy   06/07/2014 Procedure She underwent ultrasound-guided core biopsy of  the neck lymph node   06/07/2014 Pathology Results Accession: [id number] biopsy confirmed diagnosis of Hodgkin lymphoma.   06/15/2014 Imaging Echocardiogram showed preserved ejection fraction                               Scheduled Meds: . sodium chloride   Intravenous Once  . atazanavir  300 mg Oral Q breakfast  . buPROPion  150 mg Oral QHS  . dronabinol  10 mg Oral BID AC  . emtricitabine-tenofovir  1 tablet Oral Daily  . famotidine  40 mg Oral QHS  . heparin  5,000 Units Subcutaneous 3 times per day  . piperacillin-tazobactam (ZOSYN)  IV  3.375 g Intravenous Q8H  . ritonavir  100 mg Oral Q breakfast  . traZODone  50 mg Oral QHS  . vancomycin  500 mg Intravenous Q12H   Continuous Infusions: . sodium chloride 100 mL/hr at 07/10/14 0520   PRN Meds:   Objective:  Filed Vitals:   07/10/14 0800  BP: 90/47  Pulse: 88  Temp:   Resp: 30      Intake/Output Summary (Last 24 hours) at 07/10/14 0831 Last data filed at 07/10/14 0800  Gross per 24 hour  Intake 2661.67 ml  Output   1000 ml  Net 1661.67 ml    ECOG PERFORMANCE STATUS: 2  GENERAL: alert, no distress and comfortable, cachectic SKIN: skin color, texture, turgor are normal, no rashes or significant lesions EYES: normal, conjunctiva are pale and non-injected, sclera clear OROPHARYNX:no exudate, no erythema and lips, buccal mucosa, and tongue normal  NECK: supple, thyroid normal size, non-tender, without nodularity LYMPH:  no palpable lymphadenopathy in the cervical, axillary or  inguinal LUNGS: clear to auscultation and percussion with normal breathing effort HEART: regular rate & rhythm and no murmurs and no lower extremity edema ABDOMEN: soft, non-tender and appear distended. Musculoskeletal:no cyanosis of digits and no clubbing  PSYCH: alert & oriented x 3 with fluent speech NEURO: no focal motor/sensory deficits    CBG (last 3)  No results for input(s): GLUCAP in the last 72 hours.   Labs:    Recent Labs Lab 07/09/14 0429 07/09/14 0933 07/10/14 0656  WBC 4.9 2.6* 4.5  HGB 7.7* 8.4* 5.9*  HCT 24.6* 27.4* 19.1*  PLT 150 99* 186  MCV 91.4 90.4 91.0  MCH 28.6 27.7 28.1  MCHC 31.3 30.7 30.9  RDW 18.6* 18.6* 19.2*  LYMPHSABS 0.5*  --   --   MONOABS 0.8  --   --   EOSABS 0.0  --   --   BASOSABS 0.0  --   --      Chemistries:    Recent Labs Lab 07/09/14 0429 07/09/14 0933 07/10/14 0400  NA 132* 131* 132*  K 4.0 3.0* 3.8  CL 96 103 103  CO2 23 22 19   GLUCOSE 138* 125* 101*  BUN 15 14 12   CREATININE 0.51 0.35* 0.60  CALCIUM 8.3* 7.2* 7.4*  MG  --  2.1  --   AST 38* 29  --   ALT 41* 33  --   ALKPHOS 145* 121*  --   BILITOT 2.9* 1.8*  --     GFR Estimated Creatinine Clearance: 77 mL/min (by C-G formula based on Cr of 0.6).  Liver Function Tests:  Recent Labs Lab 07/09/14 0429 07/09/14 0933  AST 38* 29  ALT 41* 33  ALKPHOS 145* 121*  BILITOT 2.9* 1.8*  PROT 6.6 5.4*  ALBUMIN 1.8* 1.5*   No results for input(s): LIPASE, AMYLASE in the last 168 hours.  Recent Labs Lab 07/09/14 0509  AMMONIA 17    Urine Studies     Component Value Date/Time   COLORURINE YELLOW 07/09/2014 0618   APPEARANCEUR CLOUDY* 07/09/2014 0618   LABSPEC 1.017 07/09/2014 0618   PHURINE 5.5 07/09/2014 0618   GLUCOSEU NEGATIVE 07/09/2014 0618   GLUCOSEU NEG mg/dL 05/19/2006 2035   HGBUR NEGATIVE 07/09/2014 0618   BILIRUBINUR NEGATIVE 07/09/2014 0618   BILIRUBINUR NEGATIVE 12/11/2010 1521   KETONESUR NEGATIVE 07/09/2014 0618   PROTEINUR NEGATIVE 07/09/2014 0618   PROTEINUR TRACE 12/11/2010 1521   UROBILINOGEN 1.0 07/09/2014 0618   UROBILINOGEN 1.0 12/11/2010 1521   NITRITE NEGATIVE 07/09/2014 0618   NITRITE NEGATIVE 12/11/2010 1521   LEUKOCYTESUR TRACE* 07/09/2014 0618    Coagulation profile  Recent Labs Lab 07/09/14 0933  INR 1.60*   Microbiology Cultures pending   Imaging Studies:  Ct Head Wo Contrast  07/09/2014   CLINICAL DATA:  Altered mental  status, hypothermia. HIV and non-Hodgkin's lymphoma.  EXAM: CT HEAD WITHOUT CONTRAST  TECHNIQUE: Contiguous axial images were obtained from the base of the skull through the vertex without intravenous contrast.  COMPARISON:  CT of the head Sep 17, 2006 and PET-CT July 02, 2014  FINDINGS: The ventricles and sulci are normal. No intraparenchymal hemorrhage, mass effect nor midline shift. No acute large vascular territory infarcts.  No abnormal extra-axial fluid collections. Basal cisterns are patent.  No skull fracture. The included ocular globes and orbital contents are non-suspicious. The mastoid aircells and included paranasal sinuses are well-aerated.  IMPRESSION: Normal noncontrast CT of the head.   Electronically Signed   By: Elon Alas  On: 07/09/2014 06:36   Dg Chest Port 1 View  07/09/2014   CLINICAL DATA:  Altered mental status.  History of lymphoma.  EXAM: PORTABLE CHEST - 1 VIEW  COMPARISON:  05/04/2014  FINDINGS: A single AP portable view of the chest demonstrates no focal airspace consolidation or alveolar edema. The lungs are grossly clear. There is no large effusion or pneumothorax. Cardiac and mediastinal contours appear unremarkable.  IMPRESSION: No active disease.   Electronically Signed   By: Andreas Newport M.D.   On: 07/09/2014 06:30    NUCLEAR MEDICINE PET SKULL BASE TO THIGH 07/02/14  COMPARISON: None.  FINDINGS: NECK  Hypermetabolic bilateral supraclavicular lymph nodes are identified. Index lymph node within the right supraclavicular region measures 1.9 cm and has an SUV max equal to 4.7.  CHEST  Hypermetabolic mediastinal and hilar adenopathy identified. Index right hilar lymph node measures 1.3 cm and has an SUV max equal to 9.6. Hypermetabolic pre-vascular lymph node measures roughly 1 cm and has an SUV max equal to 5.1. No pleural effusion identified. 3 mm anterior right upper lobe nodule is too small to characterize, image 34/series  7.  ABDOMEN/PELVIS  There is no abnormal FDG uptake identified within the liver. The spleen is enlarged measuring 16 cm in cranial caudal dimension. There is diffuse tumor involvement throughout the spleen with SUVs as high as 6.3. The right kidney is normal. There is asymmetric left-sided nephromegaly and hydronephrosis. There is extensive hypermetabolic adenopathy within the abdomen and pelvis with mass effect upon the left renal collecting system likely causing hydronephrosis. Index right retrocrural lymph node measures 1.8 and has an SUV max equal to 10.3. Extensive retroperitoneal and mesenteric adenopathy is present. Index mesenteric lymph node measures 2.9 cm and has an SUV max equal to 10.5. Index retrocaval lymph node measures 1.7 cm and has an SUV max equal to 9.9. Hypermetabolic left common iliac node measures 2.1 and has an SUV max equal to 11.8. Hypermetabolic right external iliac node measures 1.8 cm and has an SUV max equal to 11.8. No hypermetabolic inguinal adenopathy.  SKELETON  Extensive, heterogeneous increased uptake throughout the proximal appendicular and axial skeleton compatible with bone marrow involvement by tumor. Index lesion within the right clavicle head has an SUV max equal to 10.9. Posterior right iliac lesion has an SUV max equal to 8.0. Lesion within the proximal left femur has an SUV max equal to 6.4.  IMPRESSION: 1. Extensive hypermetabolic adenopathy is identified within the lower neck, chest, abdomen and pelvis. There is also extensive involvement of the enlarged spleen and within the proximal appendicular and axial skeleton. 2. Retroperitoneal or pelvic adenopathy obstructs the left renal collecting system.  Electronically Signed  By: Kerby Moors M.D.  On: 07/02/2014 08:50  Assessment/Plan: 29 y.o.   Hodgkin's Lymphoma There is delay of starting treatment as she missed several appointments. Pulmonary Function tests were performed on 3/9 Port  placement.is scheduled for 3/21 at 12:30 pm A PET/CT scan on 07/02/14 showed extensive hypermetabolic adenopathy within the lower neck, chest, abdomen and pelvis. There is also extensive involvement of the enlarged spleen and within the proximal appendicular and axial skeleton.  Retroperitoneal or pelvic adenopathy obstructing  the left renal collecting system is noted . Treatment was scheduled for this week, with Bleomycin, Vinblastine, Adriamycin, Dacarbazine and Solumedrol/Prednisone, with intent to cure  Adriamycin and vinblastine, will be dose reduced by 50% due to abnormal liver function tests Will wait for cultures to return and patient's clinical status improves in  order to proceed with chemo as planned.   Septic Shock  She was admitted on 3/13 with septic shock  CT head and chest x ray were negative.  Influenza screen and Strep pneumonia in urine are negative.  Respiratory virus panel and Legionella urinary antigen are pending. Cultures are pending.  She was placed on Vanco/ Zosyn IV as recommended by Infectious Diseases  Appreciate primary team and Infectious diseases involvement  Anemia in neoplastic disease and chronic illness In the setting of malignancy, malnutrition, dilution, sepsis, antibiotics. She has recently received 2 units of blood for a Hb of 5.8 Currently Hb is 5.9. Transfuse blood to maintain a Hb of 8 g or if the patient is acutely bleeding Monitor counts closely  Human immunodeficiency virus (HIV) disease She is on antiretrovirals Her last HIV viral load was undetectable. She is compliant taking all her medications. Pharmacy to monitor for potential drug interaction of her anti-retroviral treatment with chemotherapy.  Abnormal liver function tests She has high bilirubin level, but improving from prior labs.  Recommend close monitoring.  She is not symptomatic. Her recent hepatitis screen was negative. Will reduce 2 of the chemotherapeutic agents, Adriamycin and  vinblastine, by 50%.  Confusion Likely acute encephalopathy in the setting of sepsis CT head was negative Her confusion has resolved  Malnutrition Consider Nutrition evaluation Patient is on Marinol  Medical noncompliance If infection is excluded, I felt that it would be best to put a port placed while she is hospitalized. The patient declined and insisted that she needs to leave soon. I told the patient that if she does not show up for her port placement next week, she will not get chemotherapy next week.  DVT prophylaxis On Heparin  Full Code   Other medical issues as per admitting team Will follow  Rondel Jumbo, PA-C 07/10/2014  8:31 AM  Juna Caban, MD 07/10/2014

## 2014-07-10 NOTE — Progress Notes (Addendum)
INFECTIOUS DISEASE PROGRESS NOTE  ID: [name] is a 29 y.o. female with  Principal Problem:   Severe sepsis with septic shock Active Problems:   Human immunodeficiency virus (HIV) disease   Herpes simplex virus (HSV) infection   Condyloma acuminatum   Depression   Chronic periodontitis   Underweight   AIN III (anal intraepithelial neoplasia III)   Myalgia and myositis   Abnormal liver function tests   Anemia in chronic illness   Hodgkin's lymphoma   Confusion   Hypotension   Lactic acidosis   Blood poisoning  Subjective: Fever this AM No cough, no sore throat, no rhinorrhea.   Abtx:  Anti-infectives    Start     Dose/Rate Route Frequency Ordered Stop   07/09/14 2000  vancomycin (VANCOCIN) 500 mg in sodium chloride 0.9 % 100 mL IVPB     500 mg 100 mL/hr over 60 Minutes Intravenous Every 12 hours 07/09/14 0616     07/09/14 1700  atazanavir (REYATAZ) capsule 300 mg     300 mg Oral Daily with breakfast 07/09/14 1459     07/09/14 1700  ritonavir (NORVIR) tablet 100 mg     100 mg Oral Daily with breakfast 07/09/14 1459     07/09/14 1700  emtricitabine-tenofovir (TRUVADA) 200-300 MG per tablet 1 tablet     1 tablet Oral Daily 07/09/14 1459     07/09/14 1500  piperacillin-tazobactam (ZOSYN) IVPB 3.375 g     3.375 g 12.5 mL/hr over 240 Minutes Intravenous Every 8 hours 07/09/14 0616     07/09/14 1300  oseltamivir (TAMIFLU) capsule 75 mg  Status:  Discontinued     75 mg Oral 2 times daily 07/09/14 1118 07/10/14 0721   07/09/14 0615  piperacillin-tazobactam (ZOSYN) IVPB 3.375 g     3.375 g 100 mL/hr over 30 Minutes Intravenous  Once 07/09/14 0603 07/09/14 1029   07/09/14 0615  vancomycin (VANCOCIN) IVPB 1000 mg/200 mL premix     1,000 mg 200 mL/hr over 60 Minutes Intravenous  Once 07/09/14 0603 07/09/14 0913      Medications:  Scheduled: . sodium chloride   Intravenous Once  . atazanavir  300 mg Oral Q breakfast  . buPROPion  150 mg Oral QHS  . dronabinol  10  mg Oral BID AC  . emtricitabine-tenofovir  1 tablet Oral Daily  . famotidine  40 mg Oral QHS  . heparin  5,000 Units Subcutaneous 3 times per day  . piperacillin-tazobactam (ZOSYN)  IV  3.375 g Intravenous Q8H  . ritonavir  100 mg Oral Q breakfast  . traZODone  50 mg Oral QHS  . vancomycin  500 mg Intravenous Q12H    Objective: Vital signs in last 24 hours: Temp:  [98.7 F (37.1 C)-101.3 F (38.5 C)] 98.7 F (37.1 C) (03/15 1200) Pulse Rate:  [45-197] 45 (03/15 1200) Resp:  [16-34] 27 (03/15 1400) BP: (77-98)/(41-65) 92/52 mmHg (03/15 1400) SpO2:  [83 %-100 %] 100 % (03/15 1300) Weight:  [46.993 kg (103 lb 9.6 oz)] 46.993 kg (103 lb 9.6 oz) (03/15 0500)   General appearance: alert, cooperative and no distress Throat: poor dentition Resp: clear to auscultation bilaterally Cardio: regular rate and rhythm GI: normal findings: bowel sounds normal and soft, non-tender  Lab Results  Recent Labs  07/09/14 0933 07/10/14 0400 07/10/14 0656  WBC 2.6*  --  4.5  HGB 8.4*  --  5.9*  HCT 27.4*  --  19.1*  NA 131* 132*  --  K 3.0* 3.8  --   CL 103 103  --   CO2 22 19  --   BUN 14 12  --   CREATININE 0.35* 0.60  --    Liver Panel  Recent Labs  07/09/14 0429 07/09/14 0933  PROT 6.6 5.4*  ALBUMIN 1.8* 1.5*  AST 38* 29  ALT 41* 33  ALKPHOS 145* 121*  BILITOT 2.9* 1.8*   Sedimentation Rate No results for input(s): ESRSEDRATE in the last 72 hours. C-Reactive Protein No results for input(s): CRP in the last 72 hours.  Microbiology: Recent Results (from the past 240 hour(s))  Culture, blood (routine x 2)     Status: None (Preliminary result)   Collection Time: 07/09/14  4:45 AM  Result Value Ref Range Status   Specimen Description BLOOD LEFT ARM  Final   Special Requests BOTTLES DRAWN AEROBIC AND ANAEROBIC 2.5ML  Final   Culture   Final           BLOOD CULTURE RECEIVED NO GROWTH TO DATE CULTURE WILL BE HELD FOR 5 DAYS BEFORE ISSUING A FINAL NEGATIVE REPORT Performed  at [facility]    Report Status PENDING  Incomplete  Urine culture     Status: None   Collection Time: 07/09/14  6:19 AM  Result Value Ref Range Status   Specimen Description URINE, CLEAN CATCH  Final   Special Requests Immunocompromised  Final   Colony Count   Final    50,000 COLONIES/ML Performed at [facility]   Final    Multiple bacterial morphotypes present, none predominant. Suggest appropriate recollection if clinically indicated. Performed at [facility]    Report Status 07/10/2014 FINAL  Final  Culture, blood (routine x 2)     Status: None (Preliminary result)   Collection Time: 07/09/14  9:44 AM  Result Value Ref Range Status   Specimen Description BLOOD RAC  Final   Special Requests BOTTLES DRAWN AEROBIC AND ANAEROBIC 5CC  Final   Culture   Final           BLOOD CULTURE RECEIVED NO GROWTH TO DATE CULTURE WILL BE HELD FOR 5 DAYS BEFORE ISSUING A FINAL NEGATIVE REPORT Performed at [facility]    Report Status PENDING  Incomplete    Studies/Results: Ct Head Wo Contrast  07/09/2014   CLINICAL DATA:  Altered mental status, hypothermia. HIV and non-Hodgkin's lymphoma.  EXAM: CT HEAD WITHOUT CONTRAST  TECHNIQUE: Contiguous axial images were obtained from the base of the skull through the vertex without intravenous contrast.  COMPARISON:  CT of the head Sep 17, 2006 and PET-CT July 02, 2014  FINDINGS: The ventricles and sulci are normal. No intraparenchymal hemorrhage, mass effect nor midline shift. No acute large vascular territory infarcts.  No abnormal extra-axial fluid collections. Basal cisterns are patent.  No skull fracture. The included ocular globes and orbital contents are non-suspicious. The mastoid aircells and included paranasal sinuses are well-aerated.  IMPRESSION: Normal noncontrast CT of the head.   Electronically Signed   By: Elon Alas   On: 07/09/2014 06:36   Dg Chest Port 1 View  07/09/2014   CLINICAL  DATA:  Altered mental status.  History of lymphoma.  EXAM: PORTABLE CHEST - 1 VIEW  COMPARISON:  05/04/2014  FINDINGS: A single AP portable view of the chest demonstrates no focal airspace consolidation or alveolar edema. The lungs are grossly clear. There is no large effusion or pneumothorax. Cardiac and mediastinal contours  appear unremarkable.  IMPRESSION: No active disease.   Electronically Signed   By: Andreas Newport M.D.   On: 07/09/2014 06:30     Assessment/Plan: AIDS Hodgkin's Lymphoma Pancytopenia Sepsis non-compliance Protein-albumin malnutrition, severe  Total days of antibiotics: 1 vanco/zosyn/tamiflu         Flu (-) BCx ngtd CD4 and HIV RNA pending. No change in anbx for now, stop tamiflu.  Take her out of airborne. She has no respiratory sx and her CXR is (-) Stop pepcid as this will interfere with her atazanavir.  Agree with doing as much of her h/o eval in hospital as  possible.   [name] Infectious Diseases (pager) [phone] [email or web address] 07/10/2014, 2:52 PM  LOS: 1 day

## 2014-07-11 DIAGNOSIS — D638 Anemia in other chronic diseases classified elsewhere: Secondary | ICD-10-CM

## 2014-07-11 DIAGNOSIS — C8190 Hodgkin lymphoma, unspecified, unspecified site: Secondary | ICD-10-CM

## 2014-07-11 DIAGNOSIS — R7989 Other specified abnormal findings of blood chemistry: Secondary | ICD-10-CM

## 2014-07-11 LAB — QUANTIFERON IN TUBE
QFT TB AG MINUS NIL VALUE: 0.02 IU/mL
QUANTIFERON MITOGEN VALUE: 0.18 IU/mL
QUANTIFERON NIL VALUE: 0.09 [IU]/mL
QUANTIFERON TB AG VALUE: 0.11 [IU]/mL
QUANTIFERON TB GOLD: UNDETERMINED

## 2014-07-11 LAB — CBC
HCT: 27.6 % — ABNORMAL LOW (ref 36.0–46.0)
Hemoglobin: 8.8 g/dL — ABNORMAL LOW (ref 12.0–15.0)
MCH: 28.3 pg (ref 26.0–34.0)
MCHC: 31.9 g/dL (ref 30.0–36.0)
MCV: 88.7 fL (ref 78.0–100.0)
PLATELETS: 169 10*3/uL (ref 150–400)
RBC: 3.11 MIL/uL — ABNORMAL LOW (ref 3.87–5.11)
RDW: 17.9 % — ABNORMAL HIGH (ref 11.5–15.5)
WBC: 3.9 10*3/uL — ABNORMAL LOW (ref 4.0–10.5)

## 2014-07-11 LAB — RESPIRATORY VIRUS PANEL
ADENOVIRUS: NEGATIVE
Influenza A: NEGATIVE
Influenza B: NEGATIVE
Metapneumovirus: NEGATIVE
Parainfluenza 1: NEGATIVE
Parainfluenza 2: NEGATIVE
Parainfluenza 3: NEGATIVE
RESPIRATORY SYNCYTIAL VIRUS A: NEGATIVE
RESPIRATORY SYNCYTIAL VIRUS B: NEGATIVE
RHINOVIRUS: NEGATIVE

## 2014-07-11 LAB — QUANTIFERON TB GOLD ASSAY (BLOOD)

## 2014-07-11 MED ORDER — BOOST PLUS PO LIQD
237.0000 mL | Freq: Three times a day (TID) | ORAL | Status: DC
  Administered 2014-07-11: 237 mL via ORAL
  Filled 2014-07-11 (×4): qty 237

## 2014-07-11 NOTE — Progress Notes (Signed)
INFECTIOUS DISEASE PROGRESS NOTE  ID: [name] is a 29 y.o. female with  Principal Problem:   Severe sepsis with septic shock Active Problems:   Human immunodeficiency virus (HIV) disease   Herpes simplex virus (HSV) infection   Condyloma acuminatum   Depression   Chronic periodontitis   Underweight   AIN III (anal intraepithelial neoplasia III)   Myalgia and myositis   Abnormal liver function tests   Anemia in chronic illness   Hodgkin's lymphoma   Confusion   Hypotension   Lactic acidosis   Blood poisoning   Protein-calorie malnutrition, severe  Subjective: She is doing much better.  Has been up, ambulating.   Abtx:  Anti-infectives    Start     Dose/Rate Route Frequency Ordered Stop   07/11/14 0200  vancomycin (VANCOCIN) IVPB 750 mg/150 ml premix     750 mg 150 mL/hr over 60 Minutes Intravenous Every 8 hours 07/10/14 2321     07/09/14 2000  vancomycin (VANCOCIN) 500 mg in sodium chloride 0.9 % 100 mL IVPB  Status:  Discontinued     500 mg 100 mL/hr over 60 Minutes Intravenous Every 12 hours 07/09/14 0616 07/10/14 2321   07/09/14 1700  atazanavir (REYATAZ) capsule 300 mg     300 mg Oral Daily with breakfast 07/09/14 1459     07/09/14 1700  ritonavir (NORVIR) tablet 100 mg     100 mg Oral Daily with breakfast 07/09/14 1459     07/09/14 1700  emtricitabine-tenofovir (TRUVADA) 200-300 MG per tablet 1 tablet     1 tablet Oral Daily 07/09/14 1459     07/09/14 1500  piperacillin-tazobactam (ZOSYN) IVPB 3.375 g     3.375 g 12.5 mL/hr over 240 Minutes Intravenous Every 8 hours 07/09/14 0616     07/09/14 1300  oseltamivir (TAMIFLU) capsule 75 mg  Status:  Discontinued     75 mg Oral 2 times daily 07/09/14 1118 07/10/14 0721   07/09/14 0615  piperacillin-tazobactam (ZOSYN) IVPB 3.375 g     3.375 g 100 mL/hr over 30 Minutes Intravenous  Once 07/09/14 0603 07/09/14 1029   07/09/14 0615  vancomycin (VANCOCIN) IVPB 1000 mg/200 mL premix     1,000 mg 200 mL/hr over 60  Minutes Intravenous  Once 07/09/14 0603 07/09/14 0913      Medications:  Scheduled: . atazanavir  300 mg Oral Q breakfast  . buPROPion  150 mg Oral QHS  . dronabinol  10 mg Oral BID AC  . emtricitabine-tenofovir  1 tablet Oral Daily  . heparin  5,000 Units Subcutaneous 3 times per day  . lactose free nutrition  237 mL Oral TID WC  . piperacillin-tazobactam (ZOSYN)  IV  3.375 g Intravenous Q8H  . ritonavir  100 mg Oral Q breakfast  . traZODone  50 mg Oral QHS  . vancomycin  750 mg Intravenous Q8H    Objective: Vital signs in last 24 hours: Temp:  [94.1 F (34.5 C)-100.5 F (38.1 C)] 100.3 F (37.9 C) (03/16 1457) Pulse Rate:  [36-103] 84 (03/16 1457) Resp:  [19-34] 29 (03/16 0600) BP: (70-115)/(41-75) 90/56 mmHg (03/16 1457) SpO2:  [83 %-100 %] 93 % (03/16 1457) Weight:  [47.8 kg (105 lb 6.1 oz)] 47.8 kg (105 lb 6.1 oz) (03/16 1457)   General appearance: alert, cooperative and no distress Resp: clear to auscultation bilaterally Cardio: regular rate and rhythm GI: normal findings: bowel sounds normal and soft, non-tender  Lab Results  Recent Labs  07/09/14 0933 07/10/14  0400 07/10/14 0656 07/11/14 0345  WBC 2.6*  --  4.5 3.9*  HGB 8.4*  --  5.9* 8.8*  HCT 27.4*  --  19.1* 27.6*  NA 131* 132*  --   --   K 3.0* 3.8  --   --   CL 103 103  --   --   CO2 22 19  --   --   BUN 14 12  --   --   CREATININE 0.35* 0.60  --   --    Liver Panel  Recent Labs  07/09/14 0429 07/09/14 0933  PROT 6.6 5.4*  ALBUMIN 1.8* 1.5*  AST 38* 29  ALT 41* 33  ALKPHOS 145* 121*  BILITOT 2.9* 1.8*   Sedimentation Rate No results for input(s): ESRSEDRATE in the last 72 hours. C-Reactive Protein No results for input(s): CRP in the last 72 hours.  Microbiology: Recent Results (from the past 240 hour(s))  Culture, blood (routine x 2)     Status: None (Preliminary result)   Collection Time: 07/09/14  4:45 AM  Result Value Ref Range Status   Specimen Description BLOOD LEFT ARM   Final   Special Requests BOTTLES DRAWN AEROBIC AND ANAEROBIC 2.5ML  Final   Culture   Final           BLOOD CULTURE RECEIVED NO GROWTH TO DATE CULTURE WILL BE HELD FOR 5 DAYS BEFORE ISSUING A FINAL NEGATIVE REPORT Performed at [facility]    Report Status PENDING  Incomplete  Urine culture     Status: None   Collection Time: 07/09/14  6:19 AM  Result Value Ref Range Status   Specimen Description URINE, CLEAN CATCH  Final   Special Requests Immunocompromised  Final   Colony Count   Final    50,000 COLONIES/ML Performed at [facility]   Final    Multiple bacterial morphotypes present, none predominant. Suggest appropriate recollection if clinically indicated. Performed at [facility]    Report Status 07/10/2014 FINAL  Final  Culture, blood (routine x 2)     Status: None (Preliminary result)   Collection Time: 07/09/14  9:44 AM  Result Value Ref Range Status   Specimen Description BLOOD RAC  Final   Special Requests BOTTLES DRAWN AEROBIC AND ANAEROBIC 5CC  Final   Culture   Final           BLOOD CULTURE RECEIVED NO GROWTH TO DATE CULTURE WILL BE HELD FOR 5 DAYS BEFORE ISSUING A FINAL NEGATIVE REPORT Performed at [facility]    Report Status PENDING  Incomplete    Studies/Results: No results found.   Assessment/Plan: AIDS Hodgkin's Lymphoma Pancytopenia Sepsis non-compliance Protein-albumin malnutrition, severe  Total days of antibiotics: 2 vanco/zosyn  Aside from AIDS and lymphoma, her fever source is unclear.  Will continue her anbx for now.  Still some low grade temps.  cx pending.          [name] Infectious Diseases (pager) [phone] [email or web address] 07/11/2014, 5:57 PM  LOS: 2 days

## 2014-07-11 NOTE — Progress Notes (Signed)
Patient arrived to unit via wheelchair. Alert and oriented. Able to verbalize needs. Transferred from w/c to bed with stand by assist. Will continue current plan of care.

## 2014-07-11 NOTE — Progress Notes (Signed)
[name]   DOB:1985-06-17   CH#:[id number]   OIN#:[id number]  Patient Care Team: Thayer Headings, MD as PCP - General (Internal Medicine) Thayer Headings, MD as PCP - Infectious Diseases (Infectious Diseases) Woodroe Mode, MD as Consulting Physician (Obstetrics and Gynecology) Heath Lark, MD as Consulting Physician (Hematology and Oncology)  I have seen the patient, examined her and edited the notes as follows  Subjective: She is feeling well this morning, wants to go home.Denies fevers, chills, night sweats, vision changes, or mucositis. Denies any respiratory complaints. Denies any chest pain or palpitations. Denies lower extremity swelling. Denies nausea, heartburn or change in bowel habits. Appetite is normal. Denies any dysuria. Denies abnormal skin rashes, or neuropathy. Denies any bleeding issues such as epistaxis, hematemesis, hematuria or hematochezia. Ambulating without difficulty.   Scheduled Meds: . atazanavir  300 mg Oral Q breakfast  . buPROPion  150 mg Oral QHS  . dronabinol  10 mg Oral BID AC  . emtricitabine-tenofovir  1 tablet Oral Daily  . heparin  5,000 Units Subcutaneous 3 times per day  . piperacillin-tazobactam (ZOSYN)  IV  3.375 g Intravenous Q8H  . ritonavir  100 mg Oral Q breakfast  . traZODone  50 mg Oral QHS  . vancomycin  750 mg Intravenous Q8H   Continuous Infusions: . sodium chloride 100 mL/hr at 07/10/14 0520   PRN Meds:acetaminophen   Objective:  Filed Vitals:   07/11/14 0600  BP: 98/58  Pulse: 87  Temp:   Resp: 29      Intake/Output Summary (Last 24 hours) at 07/11/14 0656 Last data filed at 07/11/14 0600  Gross per 24 hour  Intake 5007.5 ml  Output   2100 ml  Net 2907.5 ml    ECOG PERFORMANCE STATUS:1  GENERAL:alert, no distress and comfortable SKIN: skin color, texture, turgor are normal, no rashes or significant lesions EYES: normal, conjunctiva are pink and non-injected, sclera clear OROPHARYNX:no exudate, no erythema and  lips, buccal mucosa, and tongue normal  NECK: supple, thyroid normal size, non-tender, without nodularity LYMPH:  no palpable lymphadenopathy in the cervical, axillary or inguinal LUNGS: clear to auscultation and percussion with normal breathing effort HEART: regular rate & rhythm and no murmurs and no lower extremity edema ABDOMEN: soft, non-tender and normal bowel sounds Musculoskeletal:no cyanosis of digits and no clubbing  PSYCH: alert & oriented x 3 with fluent speech NEURO: no focal motor/sensory deficits    CBG (last 3)  No results for input(s): GLUCAP in the last 72 hours.   Labs:   Recent Labs Lab 07/09/14 0429 07/09/14 0933 07/10/14 0656 07/11/14 0345  WBC 4.9 2.6* 4.5 3.9*  HGB 7.7* 8.4* 5.9* 8.8*  HCT 24.6* 27.4* 19.1* 27.6*  PLT 150 99* 186 169  MCV 91.4 90.4 91.0 88.7  MCH 28.6 27.7 28.1 28.3  MCHC 31.3 30.7 30.9 31.9  RDW 18.6* 18.6* 19.2* 17.9*  LYMPHSABS 0.5*  --   --   --   MONOABS 0.8  --   --   --   EOSABS 0.0  --   --   --   BASOSABS 0.0  --   --   --      Chemistries:    Recent Labs Lab 07/09/14 0429 07/09/14 0933 07/10/14 0400  NA 132* 131* 132*  K 4.0 3.0* 3.8  CL 96 103 103  CO2 23 22 19   GLUCOSE 138* 125* 101*  BUN 15 14 12   CREATININE 0.51 0.35* 0.60  CALCIUM 8.3* 7.2*  7.4*  MG  --  2.1  --   AST 38* 29  --   ALT 41* 33  --   ALKPHOS 145* 121*  --   BILITOT 2.9* 1.8*  --     GFR Estimated Creatinine Clearance: 77 mL/min (by C-G formula based on Cr of 0.6).  Liver Function Tests:  Recent Labs Lab 07/09/14 0429 07/09/14 0933  AST 38* 29  ALT 41* 33  ALKPHOS 145* 121*  BILITOT 2.9* 1.8*  PROT 6.6 5.4*  ALBUMIN 1.8* 1.5*    Coagulation profile  Recent Labs Lab 07/09/14 0933  INR 1.60*   Cultures negative to date  Imaging Studies:  No results found.  Assessment/Plan: 29 y.o.  Hodgkin's Lymphoma There is delay of starting treatment as she missed several appointments. Pulmonary Function tests were  performed on 3/9 Port placement.is scheduled for 3/21 at 12:30 pm A PET/CT scan on 07/02/14 showed extensive hypermetabolic adenopathy within the lower neck, chest, abdomen and pelvis. There is also extensive involvement of the enlarged spleen and within the proximal appendicular and axial skeleton. Retroperitoneal or pelvic adenopathy obstructing the left renal collecting system is noted . Treatment was scheduled for this week, with Bleomycin, Vinblastine, Adriamycin, Dacarbazine and Solumedrol/Prednisone, with intent to cure Adriamycin and vinblastine, will be dose reduced by 50% due to abnormal liver function tests Plan for outpatient chemo next week  Septic Shock She was admitted on 3/13 with septic shock CT head and chest x ray were negative. Influenza screen and Strep pneumonia in urine are negative. Respiratory virus panel and Legionella urinary antigen are pending. 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 She has recently received 2 units of blood for a Hb of 5.8 She received 2 units of blood on 3/16 for a  Hb of 5.9 with good response Transfuse blood to maintain a Hb of 8 g or if the patient is acutely bleeding Monitor counts closely  Human immunodeficiency virus (HIV) disease She is on antiretrovirals Her last HIV viral load was undetectable. New values are pending  She is compliant taking all her medications. Pharmacy to monitor for potential drug interaction of her anti-retroviral treatment with chemotherapy.  Abnormal liver function tests She has high bilirubin level, but improving from prior labs.  Recommend close monitoring.  She is not symptomatic. Her recent hepatitis screen was negative. Will reduce 2 of the  chemotherapeutic agents, Adriamycin and vinblastine, by 50%.  Confusion, resolved Likely acute encephalopathy in the setting of sepsis CT head was negative Her confusion has resolved  Malnutrition Patient is on Marinol  Medical noncompliance If infection is excluded, felt that it would be best to put a port placed while she is hospitalized. Ultimately, after extensive discussion today in the presence of her mother, she agreed to undergo port placement tomorrow.  DVT prophylaxis On Heparin  Full Code Other medical issues as per admitting team    **Disclaimer: This note was dictated with voice recognition software. Similar sounding words can inadvertently be transcribed and this note may contain transcription errors which may not have been corrected upon publication of note.Sharene Butters E, PA-C 07/11/2014  6:56 AM Kingsley Herandez, MD 07/11/2014

## 2014-07-11 NOTE — Progress Notes (Signed)
INITIAL NUTRITION ASSESSMENT  DOCUMENTATION CODES Per approved criteria  -Severe malnutrition in the context of chronic illness  Pt meets criteria for severe MALNUTRITION in the context of chronic illness as evidenced by severe fat and muscle wasting.  INTERVENTION: - Boost Plus TID - RD will continue to monitor  NUTRITION DIAGNOSIS: Inadequate oral intake related to poor appetite as evidenced by poor po intake.   Goal: Pt to meet >/= 90% of their estimated nutrition needs   Monitor:  Weight trend, po intake, acceptance of supplements, labs  Reason for Assessment: Low BMI  29 y.o. female  Admitting Dx: Severe sepsis with septic shock  ASSESSMENT: 29 y.o. female with a PMH of HIV/AIDS diagnosed 2006, previously on ATVr/TRV, hospitalized 1/16 and found to have lymphadenopathy, anemia and splenomegally, status post biopsy with findings consistent with Hodgkin's lymphoma. She saw Dr. Alvy Bimler for treatment recommendations but treatment has been delayed due to missed appts. She was last seen 06/26/14 with plans to start chemotherapy the following week. She was admitted 07/08/14 with fever, confusion and failure to thrive.   - Pt not eating well. She reports that her appetite has been poor for the past several months. She only ate a few bites of food for lunch. Pt says that she drinks Boost at home 2-3 times daily. Would like to have sent during hospital stay. - Pt reports her usual body weight as ~100 lbs. Current weight is 103 lbs, suspect this may be wrong. Weight from 2 weeks ago was 93 lbs.  - Labs reviewed  Nutrition Focused Physical Exam:  Subcutaneous Fat:  Orbital Region: moderate to severe depletion Upper Arm Region: moderate to severe depletion Thoracic and Lumbar Region: n/a  Muscle:  Temple Region: moderate depletion Clavicle Bone Region: moderate to severe depletion Clavicle and Acromion Bone Region: moderate to severe depletion Scapular Bone Region: n/a Dorsal  Hand: moderate depletion Patellar Region: moderate to severe depletion Anterior Thigh Region:moderate to severe depletion  Posterior Calf Region: moderate to severe depletion  Height: Ht Readings from Last 1 Encounters:  07/09/14 5\' 4"  (1.626 m)    Weight: Wt Readings from Last 1 Encounters:  07/10/14 103 lb 9.6 oz (46.993 kg)    Ideal Body Weight: 54.7 kg  % Ideal Body Weight: 86%  Wt Readings from Last 10 Encounters:  07/10/14 103 lb 9.6 oz (46.993 kg)  06/26/14 93 lb 1.6 oz (42.23 kg)  06/14/14 90 lb 3.2 oz (40.914 kg)  06/12/14 89 lb (40.37 kg)  05/16/14 92 lb 14.4 oz (42.139 kg)  05/04/14 94 lb 11.2 oz (42.956 kg)  05/01/14 93 lb (42.185 kg)  04/05/14 95 lb (43.092 kg)  02/22/14 99 lb (44.906 kg)  01/25/14 100 lb (45.36 kg)    Usual Body Weight: 100 lbs  % Usual Body Weight: 103%  BMI:  Body mass index is 17.77 kg/(m^2).  Estimated Nutritional Needs: Kcal: 1400-1600 Protein: 70-85 g Fluid: 1.6 L/day  Skin: intact  Diet Order: Diet regular  EDUCATION NEEDS: -Education needs addressed   Intake/Output Summary (Last 24 hours) at 07/11/14 1328 Last data filed at 07/11/14 1200  Gross per 24 hour  Intake 5127.5 ml  Output   1850 ml  Net 3277.5 ml    Last BM: 3/16   Labs:   Recent Labs Lab 07/09/14 0429 07/09/14 0933 07/10/14 0400  NA 132* 131* 132*  K 4.0 3.0* 3.8  CL 96 103 103  CO2 23 22 19   BUN 15 14 12   CREATININE 0.51 0.35*  0.60  CALCIUM 8.3* 7.2* 7.4*  MG  --  2.1  --   PHOS  --  2.8  --   GLUCOSE 138* 125* 101*    CBG (last 3)  No results for input(s): GLUCAP in the last 72 hours.  Scheduled Meds: . atazanavir  300 mg Oral Q breakfast  . buPROPion  150 mg Oral QHS  . dronabinol  10 mg Oral BID AC  . emtricitabine-tenofovir  1 tablet Oral Daily  . heparin  5,000 Units Subcutaneous 3 times per day  . piperacillin-tazobactam (ZOSYN)  IV  3.375 g Intravenous Q8H  . ritonavir  100 mg Oral Q breakfast  . traZODone  50 mg Oral  QHS  . vancomycin  750 mg Intravenous Q8H    Continuous Infusions: . sodium chloride 100 mL/hr at 07/10/14 2449    Past Medical History  Diagnosis Date  . HIV (human immunodeficiency virus infection)   . Depression   . Anemia   . HSV (herpes simplex virus) infection   . History of shingles   . AIN III (anal intraepithelial neoplasia III)   . Condyloma acuminatum in female   . History of chronic bronchitis   . History of esophagitis     CANDIDA  . Periodontitis, chronic   . Hodgkin's lymphoma 06/12/2014  . Cancer     Hodgkin lymphoma    Past Surgical History  Procedure Laterality Date  . Dilation and curettage of uterus  2005    MISSED AB  . Examination under anesthesia N/A 09/23/2012    Procedure: EXAM UNDER ANESTHESIA;  Surgeon: Adin Hector, MD;  Location: [place];  Service: General;  Laterality: N/A;  . Laser ablation condolamata N/A 09/23/2012    Procedure: REMOVAL/ABLATION  ABLATION CONDOLAMATA WARTS;  Surgeon: Adin Hector, MD;  Location: [facility];  Service: General;  Laterality: N/A;    Laurette Schimke [place], [place], [place]

## 2014-07-11 NOTE — Progress Notes (Signed)
Patient ID: [name], female   DOB: 1985/07/06, 29 y.o.   MRN: [id number] [facility] PROGRESS NOTE  AMAKA GLUTH EQA:[id number] DOB: 06/20/1985 DOA: 07/09/2014 PCP: Scharlene Gloss, MD  Brief narrative:    29 y.o. female with a PMH of HIV/AIDS diagnosed 2006, previously on ATVr/TRV, hospitalized 1/16 and found to have lymphadenopathy, anemia and splenomegally, status post biopsy with findings consistent with Hodgkin's lymphoma. She saw Dr. Alvy Bimler for treatment recommendations but treatment has been delayed due to missed appts. She was last seen 06/26/14 with plans to start chemotherapy the following week. She was admitted 07/08/14 with fever, confusion and failure to thrive.  In the ED, noted to have a temperature of 102F, hypotension with elevated lactate. She was mildly pancytopenic. She was admitted by PCCM and started on vanco/zosyn/tamiflu. Patient was seen by ID in consultation.[facility] assumed care of this patient on 07/10/14.  Assessment/Plan:    Principal Problem:  Severe sepsis with septic shock / lactic acidosis - Currently on empiric Vancomycin, Zosyn. Tamiflu stopped since influenza panel negative.  - Legionella is negative.  - Strep pneumonia urinary antigen negative.  - Blood cultures so far show no growth. - Urine culture shows multiple morphological species, none predominant  - Stable for transfer to telemetry floor today   Active Problems:  Human immunodeficiency virus (HIV) disease / Herpes simplex virus (HSV) infection / Condyloma acuminatum - Continue Reyataz, Truvada, Norvir. - Undetectable viral load. - CD4 & VL are pending    Depression - We will continue Wellbutrin.    Chronic periodontitis - On empiric antibiotics.   Underweight - Continue Marinol. - Liberalize the diet.   AIN III (anal intraepithelial neoplasia III) - Follow-up with OB/GYN.   Myalgia and myositis - Likely related to lymphoma.   Abnormal liver function tests -  Secondary to sepsis and related shock liver. - LFT's have improved    Anemia of chronic disease - Secondary to history of HIV/AIDS, lymphoma - Hemoglobin was 5.9 on 07/10/14 - Status post 2 units PRBC blood transfusion    Hodgkin's lymphoma / pancytopenia - Appreciate oncology consult and recommendations  - Platelet count now WNL.   Acute encephalopathy - CT head negative. Mental statu sat baseline this am   DVT Prophylaxis  - Continue heparin subcutaneous   Code Status: Full.  Family Communication:  plan of care discussed with the patient Disposition Plan: Transfer to telemetry floor today. Order placed  IV access:  Peripheral IV  Procedures and diagnostic studies:    Ct Head Wo Contrast 07/09/2014: Normal noncontrast CT of the head. Electronically Signed By: Elon Alas On: 07/09/2014 06:36   Dg Chest Port 1 View 07/09/2014: No active disease.   Medical Consultants:  Dr. [name], ID Dr. Heath Lark, Oncology  Other Consultants:  None   IAnti-Infectives:   Vancomycin 07/09/14---> Zosyn 07/09/14---> Tamiflu 07/09/14---> 07/10/14   Leisa Lenz, MD  [facility] Pager [phone]  If 7PM-7AM, please contact night-coverage [email or web address] Password [email or web address] 07/11/2014, 9:50 AM   LOS: 2 days    HPI/Subjective: No acute overnight events.  Objective: Filed Vitals:   07/11/14 0600 07/11/14 0700 07/11/14 0800 07/11/14 0900  BP: 98/58 95/57 96/58  115/75  Pulse: 87 85 84 85  Temp:   98.4 F (36.9 C)   TempSrc:      Resp: 29     Height:      Weight:      SpO2: 99% 100% 100% 100%    Intake/Output Summary (Last 24 hours)  at 07/11/14 0950 Last data filed at 07/11/14 0912  Gross per 24 hour  Intake   4725 ml  Output   1850 ml  Net   2875 ml    Exam:   General:  Pt is alert, follows commands appropriately, not in acute distress  Cardiovascular: Regular rate and rhythm, S1/S2, no murmurs  Respiratory: Clear to auscultation  bilaterally, no wheezing, no crackles, no rhonchi  Abdomen: Soft, non tender, non distended, bowel sounds present  Extremities: No edema, pulses DP and PT palpable bilaterally  Neuro: Grossly nonfocal  Data Reviewed: Basic Metabolic Panel:  Recent Labs Lab 07/09/14 0429 07/09/14 0933 07/10/14 0400  NA 132* 131* 132*  K 4.0 3.0* 3.8  CL 96 103 103  CO2 23 22 19   GLUCOSE 138* 125* 101*  BUN 15 14 12   CREATININE 0.51 0.35* 0.60  CALCIUM 8.3* 7.2* 7.4*  MG  --  2.1  --   PHOS  --  2.8  --    Liver Function Tests:  Recent Labs Lab 07/09/14 0429 07/09/14 0933  AST 38* 29  ALT 41* 33  ALKPHOS 145* 121*  BILITOT 2.9* 1.8*  PROT 6.6 5.4*  ALBUMIN 1.8* 1.5*   No results for input(s): LIPASE, AMYLASE in the last 168 hours.  Recent Labs Lab 07/09/14 0509  AMMONIA 17   CBC:  Recent Labs Lab 07/09/14 0429 07/09/14 0933 07/10/14 0656 07/11/14 0345  WBC 4.9 2.6* 4.5 3.9*  NEUTROABS 3.6  --   --   --   HGB 7.7* 8.4* 5.9* 8.8*  HCT 24.6* 27.4* 19.1* 27.6*  MCV 91.4 90.4 91.0 88.7  PLT 150 99* 186 169   Cardiac Enzymes: No results for input(s): CKTOTAL, CKMB, CKMBINDEX, TROPONINI in the last 168 hours. BNP: Invalid input(s): POCBNP CBG: No results for input(s): GLUCAP in the last 168 hours.  Recent Results (from the past 240 hour(s))  Culture, blood (routine x 2)     Status: None (Preliminary result)   Collection Time: 07/09/14  4:45 AM  Result Value Ref Range Status   Specimen Description BLOOD LEFT ARM  Final   Special Requests BOTTLES DRAWN AEROBIC AND ANAEROBIC 2.5ML  Final   Culture   Final           BLOOD CULTURE RECEIVED NO GROWTH TO DATE CULTURE WILL BE HELD FOR 5 DAYS BEFORE ISSUING A FINAL NEGATIVE REPORT Performed at [facility]    Report Status PENDING  Incomplete  Urine culture     Status: None   Collection Time: 07/09/14  6:19 AM  Result Value Ref Range Status   Specimen Description URINE, CLEAN CATCH  Final   Special Requests  Immunocompromised  Final   Colony Count   Final    50,000 COLONIES/ML Performed at [facility]   Final    Multiple bacterial morphotypes present, none predominant. Suggest appropriate recollection if clinically indicated. Performed at [facility]    Report Status 07/10/2014 FINAL  Final  Culture, blood (routine x 2)     Status: None (Preliminary result)   Collection Time: 07/09/14  9:44 AM  Result Value Ref Range Status   Specimen Description BLOOD RAC  Final   Special Requests BOTTLES DRAWN AEROBIC AND ANAEROBIC 5CC  Final   Culture   Final           BLOOD CULTURE RECEIVED NO GROWTH TO DATE CULTURE WILL BE HELD FOR 5 DAYS BEFORE ISSUING A FINAL NEGATIVE  REPORT Performed at [facility]    Report Status PENDING  Incomplete     Scheduled Meds: . atazanavir  300 mg Oral Q breakfast  . buPROPion  150 mg Oral QHS  . dronabinol  10 mg Oral BID AC  . emtricitabine-tenofovir  1 tablet Oral Daily  . heparin  5,000 Units Subcutaneous 3 times per day  . piperacillin-tazobactam (ZOSYN)  IV  3.375 g Intravenous Q8H  . ritonavir  100 mg Oral Q breakfast  . traZODone  50 mg Oral QHS  . vancomycin  750 mg Intravenous Q8H   Continuous Infusions: . sodium chloride 100 mL/hr at 07/10/14 2878

## 2014-07-12 DIAGNOSIS — E43 Unspecified severe protein-calorie malnutrition: Secondary | ICD-10-CM

## 2014-07-12 LAB — TYPE AND SCREEN
ABO/RH(D): O NEG
ANTIBODY SCREEN: POSITIVE
DAT, IgG: NEGATIVE
UNIT DIVISION: 0
Unit division: 0

## 2014-07-12 MED ORDER — ACETAMINOPHEN 325 MG PO TABS: 650.0000 mg | ORAL_TABLET | ORAL | Status: DC | PRN

## 2014-07-12 NOTE — Discharge Instructions (Signed)

## 2014-07-12 NOTE — Discharge Summary (Addendum)
Physician Discharge Summary  [name] XNT:[id number] DOB: [birth year] DOA: 07/09/2014  PCP: Scharlene Gloss, MD  Admit date: 07/09/2014 Discharge date: 07/12/2014  Recommendations for Outpatient Follow-up:  1. Patient will follow-up with infectious disease per scheduled appointment. She will also follow-up with oncology and her appointment is scheduled for 07/17/2014 at 10 AM.  Discharge Diagnoses:  Principal Problem:   Severe sepsis with septic shock Active Problems:   Human immunodeficiency virus (HIV) disease   Herpes simplex virus (HSV) infection   Condyloma acuminatum   Depression   Chronic periodontitis   Underweight   AIN III (anal intraepithelial neoplasia III)   Myalgia and myositis   Abnormal liver function tests   Anemia in chronic illness   Hodgkin's lymphoma   Confusion   Hypotension   Lactic acidosis   Blood poisoning   Protein-calorie malnutrition, severe   Hodgkin lymphoma    Discharge Condition: stable   Diet recommendation: as tolerated   History of present illness: 29 y.o. female with a PMH of HIV/AIDS diagnosed 2006, previously on ATVr/TRV, hospitalized 1/16 and found to have lymphadenopathy, anemia and splenomegally, status post biopsy with findings consistent with Hodgkin's lymphoma. She saw Dr. Alvy Bimler for treatment recommendations but treatment has been delayed due to missed appts. She was last seen 06/26/14 with plans to start chemotherapy the following week. She was admitted 07/08/14 with fever, confusion and failure to thrive.   In the ED, noted to have a temperature of 102F, hypotension with elevated lactate. She was mildly pancytopenic. She was admitted by PCCM and started on vanco/zosyn/tamiflu. Patient was seen by ID in consultation.[facility] assumed care of this patient on 07/10/14.   Assessment/Plan:    Principal Problem:  Severe sepsis with septic shock / lactic acidosis - Currently on empiric Vancomycin, Zosyn. Tamiflu stopped since  influenza panel negative.  - I spoke with Dr. Johnnye Sima and we both agreed that the low grade fever is likely constitutional sx from lymphoma since no clear evidence of infection on studies done here. - Legionella is negative.  - Strep pneumonia urinary antigen negative.  - Blood cultures so far show no growth. - Urine culture shows multiple morphological species, none predominant   Active Problems:  Human immunodeficiency virus (HIV) disease / Herpes simplex virus (HSV) infection / Condyloma acuminatum - Continue Reyataz, Truvada, Norvir. - Undetectable viral load.   Depression - Continue Wellbutrin.    Chronic periodontitis -- Stable    Underweight / Severe protein calorie malnutrition  - Continue Marinol. - Liberalize the diet.   AIN III (anal intraepithelial neoplasia III) - Follow-up with OB/GYN.   Myalgia and myositis - Likely related to lymphoma.   Abnormal liver function tests - Secondary to sepsis and related shock liver. - LFT's have improved   Anemia of chronic disease - Secondary to history of HIV/AIDS, lymphoma - Hemoglobin was 5.9 on 07/10/14 - Status post 2 units PRBC blood transfusion  - Post transfusion hemoglobin is 8.8.   Hodgkin's lymphoma / pancytopenia - Appreciate oncology consult and recommendations  - Platelet count now WNL.   Acute encephalopathy - CT head negative. Mental status at baseline    DVT Prophylaxis  - Continue heparin subcutaneous while pt is in hospital    Code Status: Full.  Family Communication: plan of care discussed with the patient   IV access:  Peripheral IV  Procedures and diagnostic studies:   Ct Head Wo Contrast 07/09/2014: Normal noncontrast CT of the head. Electronically Signed By: Elon Alas On: 07/09/2014  06:36   Dg Chest Port 1 View 07/09/2014: No active disease.   Medical Consultants:  Dr. [name], ID Dr. Heath Lark, Oncology  Other Consultants:   None   IAnti-Infectives:   Vancomycin 07/09/14---> 07/12/2014 Zosyn 07/09/14---> 07/12/2014 Tamiflu 07/09/14---> 07/10/14     Signed:  Leisa Lenz, MD  [facility] 07/12/2014, 10:18 AM  Pager #: [phone]  Discharge Exam: Filed Vitals:   07/12/14 0519  BP: 102/65  Pulse: 81  Temp: 100.2 F (37.9 C)  Resp: 20   Filed Vitals:   07/11/14 1300 07/11/14 1457 07/11/14 2133 07/12/14 0519  BP:  90/56 112/65 102/65  Pulse:  84 106 81  Temp: 98.9 F (37.2 C) 100.3 F (37.9 C) 100 F (37.8 C) 100.2 F (37.9 C)  TempSrc: Oral Oral Oral Oral  Resp:   22 20  Height:  5\' 4"  (1.626 m)  5\' 4"  (1.626 m)  Weight:  47.8 kg (105 lb 6.1 oz)  50.4 kg (111 lb 1.8 oz)  SpO2:  93%  95%    General: Pt is alert, follows commands appropriately, not in acute distress Cardiovascular: Regular rate and rhythm, S1/S2 +, no murmurs Respiratory: Clear to auscultation bilaterally, no wheezing, no crackles, no rhonchi Abdominal: Soft, non tender, non distended, bowel sounds +, no guarding Extremities: no edema, no cyanosis, pulses palpable bilaterally DP and PT Neuro: Grossly nonfocal  Discharge Instructions  Discharge Instructions    Call MD for:  difficulty breathing, headache or visual disturbances    Complete by:  As directed      Call MD for:  persistant nausea and vomiting    Complete by:  As directed      Call MD for:  severe uncontrolled pain    Complete by:  As directed      Diet - low sodium heart healthy    Complete by:  As directed      Increase activity slowly    Complete by:  As directed             Medication List    TAKE these medications        acetaminophen 325 MG tablet  Commonly known as:  TYLENOL  Take 2 tablets (650 mg total) by mouth every 4 (four) hours as needed for mild pain or fever.     atazanavir 300 MG capsule  Commonly known as:  REYATAZ  Take 1 capsule (300 mg total) by mouth daily with breakfast. TAKE WITH NORVIR     CLEAR EYES OP   Apply 2 drops to eye 2 (two) times daily.     dronabinol 10 MG capsule  Commonly known as:  MARINOL  Take 1 capsule (10 mg total) by mouth 2 (two) times daily before a meal.     emtricitabine-tenofovir 200-300 MG per tablet  Commonly known as:  TRUVADA  Take 1 tablet by mouth daily.     hydrocortisone cream 1 %  Apply 1 application topically daily.     lidocaine-prilocaine cream  Commonly known as:  EMLA  Apply to affected area once     ondansetron 8 MG tablet  Commonly known as:  ZOFRAN  Take 1 tablet (8 mg total) by mouth 2 (two) times daily. Start the day after chemo for 3 days. Then take as needed for nausea or vomiting.     prochlorperazine 10 MG tablet  Commonly known as:  COMPAZINE  Take 1 tablet (10 mg total) by mouth every 6 (six) hours as needed (  Nausea or vomiting).     ritonavir 100 MG Tabs tablet  Commonly known as:  NORVIR  Take 1 tablet (100 mg total) by mouth daily with breakfast. TAKE WITH REYATAZ     traZODone 50 MG tablet  Commonly known as:  DESYREL  Take 50 mg by mouth at bedtime.     WELLBUTRIN XL 150 MG 24 hr tablet  Generic drug:  buPROPion  Take 150 mg by mouth at bedtime.           Follow-up Information    Follow up with Scharlene Gloss, MD.   Specialty:  Infectious Diseases   Why:  Follow up appt after recent hospitalization   Contact information:   [address] [address] [phone]       Follow up with [facility], [place], MD On 07/17/2014.   Specialty:  Hematology and Oncology   Why:  10 am   Contact information:   [place] [address] [phone]        The results of significant diagnostics from this hospitalization (including imaging, microbiology, ancillary and laboratory) are listed below for reference.    Significant Diagnostic Studies: Ct Head Wo Contrast  07/09/2014   CLINICAL DATA:  Altered mental status, hypothermia. HIV and non-Hodgkin's lymphoma.  EXAM: CT HEAD WITHOUT  CONTRAST  TECHNIQUE: Contiguous axial images were obtained from the base of the skull through the vertex without intravenous contrast.  COMPARISON:  CT of the head Sep 17, 2006 and PET-CT July 02, 2014  FINDINGS: The ventricles and sulci are normal. No intraparenchymal hemorrhage, mass effect nor midline shift. No acute large vascular territory infarcts.  No abnormal extra-axial fluid collections. Basal cisterns are patent.  No skull fracture. The included ocular globes and orbital contents are non-suspicious. The mastoid aircells and included paranasal sinuses are well-aerated.  IMPRESSION: Normal noncontrast CT of the head.   Electronically Signed   By: Elon Alas   On: 07/09/2014 06:36   Nm Pet Image Initial (pi) Skull Base To Thigh  07/02/2014   CLINICAL DATA:  Initial Treatment strategy for Hodgkin's lymphoma.  EXAM: NUCLEAR MEDICINE PET SKULL BASE TO THIGH  TECHNIQUE: 5.1 mCi F-18 FDG was injected intravenously. Full-ring PET imaging was performed from the skull base to thigh after the radiotracer. CT data was obtained and used for attenuation correction and anatomic localization.  FASTING BLOOD GLUCOSE:  Value: 82 mg/dl  COMPARISON:  None.  FINDINGS: NECK  Hypermetabolic bilateral supraclavicular lymph nodes are identified. Index lymph node within the right supraclavicular region measures 1.9 cm and has an SUV max equal to 4.7.  CHEST  Hypermetabolic mediastinal and hilar adenopathy identified. Index right hilar lymph node measures 1.3 cm and has an SUV max equal to 9.6. Hypermetabolic pre-vascular lymph node measures roughly 1 cm and has an SUV max equal to 5.1. No pleural effusion identified. 3 mm anterior right upper lobe nodule is too small to characterize, image 34/series 7.  ABDOMEN/PELVIS  There is no abnormal FDG uptake identified within the liver. The spleen is enlarged measuring 16 cm in cranial caudal dimension. There is diffuse tumor involvement throughout the spleen with SUVs as high as  6.3. The right kidney is normal. There is asymmetric left-sided nephromegaly and hydronephrosis. There is extensive hypermetabolic adenopathy within the abdomen and pelvis with mass effect upon the left renal collecting system likely causing hydronephrosis. Index right retrocrural lymph node measures 1.8 and has an SUV max equal to 10.3. Extensive  retroperitoneal and mesenteric adenopathy is present. Index mesenteric lymph node measures 2.9 cm and has an SUV max equal to 10.5. Index retrocaval lymph node measures 1.7 cm and has an SUV max equal to 9.9. Hypermetabolic left common iliac node measures 2.1 and has an SUV max equal to 11.8. Hypermetabolic right external iliac node measures 1.8 cm and has an SUV max equal to 11.8. No hypermetabolic inguinal adenopathy.  SKELETON  Extensive, heterogeneous increased uptake throughout the proximal appendicular and axial skeleton compatible with bone marrow involvement by tumor. Index lesion within the right clavicle head has an SUV max equal to 10.9. Posterior right iliac lesion has an SUV max equal to 8.0. Lesion within the proximal left femur has an SUV max equal to 6.4.  IMPRESSION: 1. Extensive hypermetabolic adenopathy is identified within the lower neck, chest, abdomen and pelvis. There is also extensive involvement of the enlarged spleen and within the proximal appendicular and axial skeleton. 2. Retroperitoneal or pelvic adenopathy obstructs the left renal collecting system.   Electronically Signed   By: Kerby Moors M.D.   On: 07/02/2014 08:50   Dg Chest Port 1 View  07/09/2014   CLINICAL DATA:  Altered mental status.  History of lymphoma.  EXAM: PORTABLE CHEST - 1 VIEW  COMPARISON:  05/04/2014  FINDINGS: A single AP portable view of the chest demonstrates no focal airspace consolidation or alveolar edema. The lungs are grossly clear. There is no large effusion or pneumothorax. Cardiac and mediastinal contours appear unremarkable.  IMPRESSION: No active  disease.   Electronically Signed   By: Andreas Newport M.D.   On: 07/09/2014 06:30    Microbiology: Culture, blood (routine x 2)     Status: None (Preliminary result)   Collection Time: 07/09/14  4:45 AM  Result Value Ref Range Status   Specimen Description BLOOD LEFT ARM  Final   Special Requests BOTTLES DRAWN AEROBIC AND ANAEROBIC 2.5ML  Final   Culture   Final           BLOOD CULTURE RECEIVED NO GROWTH TO DATE     Report Status PENDING  Incomplete  Urine culture     Status: None   Collection Time: 07/09/14  6:19 AM  Result Value Ref Range Status   Specimen Description URINE, CLEAN CATCH  Final   Special Requests Immunocompromise  Final   Colony Count   Final    50,000 COLONIES/ML    Culture   Final    Multiple bacterial morphotypes present, none predominant. Suggest appropriate recollection if clinically indicated.   Report Status 07/10/2014 FINAL  Final  Culture, blood (routine x 2)     Status: None (Preliminary result)   Collection Time: 07/09/14  9:44 AM  Result Value Ref Range Status   Specimen Description BLOOD RAC  Final   Special Requests BOTTLES DRAWN AEROBIC AND ANAEROBIC 5CC  Final   Culture   Final           BLOOD CULTURE RECEIVED NO GROWTH TO DATE    Report Status PENDING  Incomplete  Respiratory virus panel (routine influenza)     Status: None   Collection Time: 07/09/14 12:30 PM  Result Value Ref Range Status   Respiratory Syncytial Virus A Negative Negative Final   Respiratory Syncytial Virus B Negative Negative Final   Influenza A Negative Negative Final   Influenza B Negative Negative Final   Parainfluenza 1 Negative Negative Final   Parainfluenza 2 Negative Negative Final   Parainfluenza 3 Negative  Negative Final   Metapneumovirus Negative Negative Final   Rhinovirus Negative Negative Final   Adenovirus Negative Negative Final     Labs: Basic Metabolic Panel:  Recent Labs Lab 07/09/14 0429 07/09/14 0933 07/10/14 0400  NA 132* 131* 132*  K  4.0 3.0* 3.8  CL 96 103 103  CO2 23 22 19   GLUCOSE 138* 125* 101*  BUN 15 14 12   CREATININE 0.51 0.35* 0.60  CALCIUM 8.3* 7.2* 7.4*  MG  --  2.1  --   PHOS  --  2.8  --    Liver Function Tests:  Recent Labs Lab 07/09/14 0429 07/09/14 0933  AST 38* 29  ALT 41* 33  ALKPHOS 145* 121*  BILITOT 2.9* 1.8*  PROT 6.6 5.4*  ALBUMIN 1.8* 1.5*   No results for input(s): LIPASE, AMYLASE in the last 168 hours.  Recent Labs Lab 07/09/14 0509  AMMONIA 17   CBC:  Recent Labs Lab 07/09/14 0429 07/09/14 0933 07/10/14 0656 07/11/14 0345  WBC 4.9 2.6* 4.5 3.9*  NEUTROABS 3.6  --   --   --   HGB 7.7* 8.4* 5.9* 8.8*  HCT 24.6* 27.4* 19.1* 27.6*  MCV 91.4 90.4 91.0 88.7  PLT 150 99* 186 169   Cardiac Enzymes: No results for input(s): CKTOTAL, CKMB, CKMBINDEX, TROPONINI in the last 168 hours. BNP: BNP (last 3 results) No results for input(s): BNP in the last 8760 hours.  ProBNP (last 3 results) No results for input(s): PROBNP in the last 8760 hours.  CBG: No results for input(s): GLUCAP in the last 168 hours.  Time coordinating discharge: Over 30 minutes

## 2014-07-12 NOTE — Progress Notes (Signed)
[name]   DOB:15-May-1985   EX#:[id number]   WNU#:[id number]  Patient Care Team: Thayer Headings, MD as PCP - General (Internal Medicine) Thayer Headings, MD as PCP - Infectious Diseases (Infectious Diseases) Woodroe Mode, MD as Consulting Physician (Obstetrics and Gynecology) Heath Lark, MD as Consulting Physician (Hematology and Oncology) The patient has left. I did not see the patient today. Subjective: No new issues overnight. Afebrile. Denies chills, night sweats, vision changes, or mucositis. Denies any respiratory complaints. Denies any chest pain or palpitations. Denies lower extremity swelling. Denies nausea, heartburn or change in bowel habits.Appetite is normal. Denies any dysuria. Denies abnormal skin rashes, or neuropathy. Denies any bleeding issues such as epistaxis, hematemesis, hematuria or hematochezia. Ambulating without difficulty.   Scheduled Meds: . atazanavir  300 mg Oral Q breakfast  . buPROPion  150 mg Oral QHS  . dronabinol  10 mg Oral BID AC  . emtricitabine-tenofovir  1 tablet Oral Daily  . heparin  5,000 Units Subcutaneous 3 times per day  . lactose free nutrition  237 mL Oral TID WC  . piperacillin-tazobactam (ZOSYN)  IV  3.375 g Intravenous Q8H  . ritonavir  100 mg Oral Q breakfast  . traZODone  50 mg Oral QHS  . vancomycin  750 mg Intravenous Q8H   Continuous Infusions: . sodium chloride 100 mL/hr at 07/10/14 0520   PRN Meds:acetaminophen   Objective:  Filed Vitals:   07/12/14 0519  BP: 102/65  Pulse: 81  Temp: 100.2 F (37.9 C)  Resp: 20      Intake/Output Summary (Last 24 hours) at 07/12/14 0754 Last data filed at 07/12/14 0600  Gross per 24 hour  Intake 2847.5 ml  Output    250 ml  Net 2597.5 ml     GENERAL:alert, no distress and comfortable SKIN: skin color, texture, turgor are normal, no rashes or significant lesions EYES: normal, conjunctiva are pink and non-injected, sclera clear OROPHARYNX:no exudate, no erythema and lips,  buccal mucosa, and tongue normal  NECK: supple, thyroid normal size, non-tender, without nodularity LYMPH:  no palpable lymphadenopathy in the cervical, axillary or inguinal LUNGS: clear to auscultation and percussion with normal breathing effort HEART: regular rate & rhythm and no murmurs and no lower extremity edema ABDOMEN: soft, non-tender and normal bowel sounds Musculoskeletal:no cyanosis of digits and no clubbing  PSYCH: alert & oriented x 3 with fluent speech NEURO: no focal motor/sensory deficits    CBG (last 3)  No results for input(s): GLUCAP in the last 72 hours.   Labs:   Recent Labs Lab 07/09/14 0429 07/09/14 0933 07/10/14 0656 07/11/14 0345  WBC 4.9 2.6* 4.5 3.9*  HGB 7.7* 8.4* 5.9* 8.8*  HCT 24.6* 27.4* 19.1* 27.6*  PLT 150 99* 186 169  MCV 91.4 90.4 91.0 88.7  MCH 28.6 27.7 28.1 28.3  MCHC 31.3 30.7 30.9 31.9  RDW 18.6* 18.6* 19.2* 17.9*  LYMPHSABS 0.5*  --   --   --   MONOABS 0.8  --   --   --   EOSABS 0.0  --   --   --   BASOSABS 0.0  --   --   --      Chemistries:    Recent Labs Lab 07/09/14 0429 07/09/14 0933 07/10/14 0400  NA 132* 131* 132*  K 4.0 3.0* 3.8  CL 96 103 103  CO2 23 22 19   GLUCOSE 138* 125* 101*  BUN 15 14 12   CREATININE 0.51 0.35* 0.60  CALCIUM  8.3* 7.2* 7.4*  MG  --  2.1  --   AST 38* 29  --   ALT 41* 33  --   ALKPHOS 145* 121*  --   BILITOT 2.9* 1.8*  --     GFR Estimated Creatinine Clearance: 82.6 mL/min (by C-G formula based on Cr of 0.6).  Liver Function Tests:  Recent Labs Lab 07/09/14 0429 07/09/14 0933  AST 38* 29  ALT 41* 33  ALKPHOS 145* 121*  BILITOT 2.9* 1.8*  PROT 6.6 5.4*  ALBUMIN 1.8* 1.5*   No results for input(s): LIPASE, AMYLASE in the last 168 hours.  Recent Labs Lab 07/09/14 0509  AMMONIA 17     Coagulation profile  Recent Labs Lab 07/09/14 0933  INR 1.60*   Microbiology Cultures negative to date   Imaging Studies:  No results found.  Assessment/Plan: 29  y.o.   Hodgkin's Lymphoma There is delay of starting treatment as she missed several appointments. Pulmonary Function tests were performed on 3/9 Port placement.is scheduled for 3/21 at 12:30 pm A PET/CT scan on 07/02/14 showed extensive hypermetabolic adenopathy within the lower neck, chest, abdomen and pelvis. There is also extensive involvement of the enlarged spleen and within the proximal appendicular and axial skeleton. Retroperitoneal or pelvic adenopathy obstructing the left renal collecting system is noted . Treatment was scheduled for this week, with Bleomycin, Vinblastine, Adriamycin, Dacarbazine and Solumedrol/Prednisone, with intent to cure Adriamycin and vinblastine, will be dose reduced by 50% due to abnormal liver function tests Plan for outpatient chemo next week  Septic Shock She was admitted on 3/13 with septic shock CT head and chest x ray were negative. Influenza screen and Strep pneumonia in urine are negative. Respiratory virus panel and Legionella urinary antigen are pending. 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 She has recently received 2 units of blood for a Hb of 5.8 She received 2 units of blood on 3/16 for a Hb of 5.9 with good response Transfuse blood to maintain a Hb of 8 g or if the patient is acutely bleeding Monitor counts closely  Human immunodeficiency virus (HIV) disease She is on antiretrovirals Her last HIV viral load was undetectable. New values are pending  She is compliant taking all her medications. Pharmacy to monitor for potential drug interaction of her anti-retroviral treatment with chemotherapy.  Abnormal liver function tests She has high bilirubin level, but improving from prior labs.   Recommend close monitoring.  She is not symptomatic. Her recent hepatitis screen was negative. Will reduce 2 of the chemotherapeutic agents, Adriamycin and vinblastine, by 50%.  Confusion, resolved Likely acute encephalopathy in the setting of sepsis CT head was negative Her confusion has resolved  Malnutrition Patient is on Marinol  Medical noncompliance If infection is excluded, felt that it would be best to put a port placed while she is hospitalized. Ultimately, after extensive discussion today in the presence of her mother, she agreed to undergo port placement today but later cancelled  DVT prophylaxis On Heparin  Full Code Other medical issues as per admitting team    **Disclaimer: This note was dictated with voice recognition software. Similar sounding words can inadvertently be transcribed and this note may contain transcription errors which may not have been corrected upon publication of note.Sharene Butters E, PA-C 07/12/2014  7:54 AM Tarica Harl, MD 07/12/2014

## 2014-07-12 NOTE — Progress Notes (Signed)
IR PA aware of patient's admission for septic shock, she has been scheduled for a port a catheter as an outpatient on 3/21 for chemotherapy for hodgkin's lymphoma. Given new fevers and sepsis, we will check on her over the weekend and if afebrile > 48 hrs with no signs of active infection then she will receive port on Monday.  Tsosie Billing PA-C Interventional Radiology  07/12/14  10:01 AM

## 2014-07-13 ENCOUNTER — Other Ambulatory Visit: Payer: Self-pay | Admitting: Radiology

## 2014-07-13 LAB — HIV-1 RNA QUANT-NO REFLEX-BLD: LOG10 HIV-1 RNA: UNDETERMINED {Log_copies}/mL

## 2014-07-15 LAB — CULTURE, BLOOD (ROUTINE X 2)
CULTURE: NO GROWTH
CULTURE: NO GROWTH

## 2014-07-16 ENCOUNTER — Inpatient Hospital Stay (HOSPITAL_COMMUNITY): Admission: RE | Admit: 2014-07-16 | Payer: Self-pay | Source: Ambulatory Visit

## 2014-07-16 ENCOUNTER — Telehealth: Payer: Self-pay | Admitting: *Deleted

## 2014-07-16 ENCOUNTER — Ambulatory Visit (HOSPITAL_COMMUNITY): Admission: RE | Admit: 2014-07-16 | Payer: Self-pay | Source: Ambulatory Visit

## 2014-07-16 NOTE — Telephone Encounter (Signed)
Patients mother called to inform us that her daughter was anxious about having the Port a Cath placed today, so they called and had it rescheduled to 07/27/14.  She said she was unaware that her daughter had chemo scheduled tomorrow.  Informed her that the treatment could be given through a peripheral IV tomorrow.  She said her daughter is having some swelling in her ankles.  Advised her that Dr. Alvy Bimler will evaluate that tomorrow at the scheduled appointment.

## 2014-07-17 ENCOUNTER — Other Ambulatory Visit: Payer: Self-pay | Admitting: Hematology and Oncology

## 2014-07-17 ENCOUNTER — Ambulatory Visit: Payer: Self-pay

## 2014-07-17 ENCOUNTER — Ambulatory Visit (HOSPITAL_BASED_OUTPATIENT_CLINIC_OR_DEPARTMENT_OTHER): Payer: Self-pay | Admitting: Hematology and Oncology

## 2014-07-17 ENCOUNTER — Telehealth: Payer: Self-pay | Admitting: Hematology and Oncology

## 2014-07-17 ENCOUNTER — Encounter: Payer: Self-pay | Admitting: Hematology and Oncology

## 2014-07-17 ENCOUNTER — Other Ambulatory Visit (HOSPITAL_BASED_OUTPATIENT_CLINIC_OR_DEPARTMENT_OTHER): Payer: Self-pay

## 2014-07-17 VITALS — BP 115/80 | HR 88 | Temp 97.8°F | Resp 18 | Ht 64.0 in | Wt 105.8 lb

## 2014-07-17 DIAGNOSIS — D638 Anemia in other chronic diseases classified elsewhere: Secondary | ICD-10-CM

## 2014-07-17 DIAGNOSIS — M79604 Pain in right leg: Secondary | ICD-10-CM

## 2014-07-17 DIAGNOSIS — B20 Human immunodeficiency virus [HIV] disease: Secondary | ICD-10-CM

## 2014-07-17 DIAGNOSIS — C8190 Hodgkin lymphoma, unspecified, unspecified site: Secondary | ICD-10-CM

## 2014-07-17 DIAGNOSIS — E876 Hypokalemia: Secondary | ICD-10-CM

## 2014-07-17 DIAGNOSIS — D630 Anemia in neoplastic disease: Secondary | ICD-10-CM

## 2014-07-17 DIAGNOSIS — R600 Localized edema: Secondary | ICD-10-CM | POA: Insufficient documentation

## 2014-07-17 DIAGNOSIS — E43 Unspecified severe protein-calorie malnutrition: Secondary | ICD-10-CM

## 2014-07-17 DIAGNOSIS — M79605 Pain in left leg: Secondary | ICD-10-CM

## 2014-07-17 HISTORY — DX: Hypokalemia: E87.6

## 2014-07-17 LAB — CBC WITH DIFFERENTIAL/PLATELET
BASO%: 0 % (ref 0.0–2.0)
Basophils Absolute: 0 10*3/uL (ref 0.0–0.1)
EOS%: 0 % (ref 0.0–7.0)
Eosinophils Absolute: 0 10*3/uL (ref 0.0–0.5)
HCT: 30.8 % — ABNORMAL LOW (ref 34.8–46.6)
HGB: 9.8 g/dL — ABNORMAL LOW (ref 11.6–15.9)
LYMPH#: 0.5 10*3/uL — AB (ref 0.9–3.3)
LYMPH%: 5.6 % — AB (ref 14.0–49.7)
MCH: 28.1 pg (ref 25.1–34.0)
MCHC: 31.8 g/dL (ref 31.5–36.0)
MCV: 88.3 fL (ref 79.5–101.0)
MONO#: 0.7 10*3/uL (ref 0.1–0.9)
MONO%: 8.1 % (ref 0.0–14.0)
NEUT#: 6.9 10*3/uL — ABNORMAL HIGH (ref 1.5–6.5)
NEUT%: 86.3 % — ABNORMAL HIGH (ref 38.4–76.8)
Platelets: 244 10*3/uL (ref 145–400)
RBC: 3.49 10*6/uL — AB (ref 3.70–5.45)
RDW: 19 % — ABNORMAL HIGH (ref 11.2–14.5)
WBC: 8 10*3/uL (ref 3.9–10.3)

## 2014-07-17 LAB — COMPREHENSIVE METABOLIC PANEL (CC13)
ALK PHOS: 260 U/L — AB (ref 40–150)
ALT: 55 U/L (ref 0–55)
AST: 24 U/L (ref 5–34)
Albumin: 1.5 g/dL — ABNORMAL LOW (ref 3.5–5.0)
Anion Gap: 14 mEq/L — ABNORMAL HIGH (ref 3–11)
BILIRUBIN TOTAL: 2.28 mg/dL — AB (ref 0.20–1.20)
BUN: 5.7 mg/dL — ABNORMAL LOW (ref 7.0–26.0)
CO2: 24 meq/L (ref 22–29)
CREATININE: 0.5 mg/dL — AB (ref 0.6–1.1)
Calcium: 8.1 mg/dL — ABNORMAL LOW (ref 8.4–10.4)
Chloride: 97 mEq/L — ABNORMAL LOW (ref 98–109)
EGFR: >90 mL/min/{1.73_m2} (ref >90)
Glucose: 96 mg/dl (ref 70–140)
Potassium: 2.6 mEq/L — CL (ref 3.5–5.1)
SODIUM: 135 meq/L — AB (ref 136–145)
Total Protein: 6.6 g/dL (ref 6.4–8.3)

## 2014-07-17 LAB — HOLD TUBE, BLOOD BANK

## 2014-07-17 LAB — PREGNANCY, URINE: PREG TEST UR: NEGATIVE

## 2014-07-17 MED ORDER — POTASSIUM CHLORIDE CRYS ER 20 MEQ PO TBCR: 20.0000 meq | EXTENDED_RELEASE_TABLET | Freq: Two times a day (BID) | ORAL | Status: DC

## 2014-07-17 NOTE — Progress Notes (Signed)
[facility] OFFICE PROGRESS NOTE  Patient Care Team: Thayer Headings, MD as PCP - General (Internal Medicine) Thayer Headings, MD as PCP - Infectious Diseases (Infectious Diseases) Woodroe Mode, MD as Consulting Physician (Obstetrics and Gynecology) Heath Lark, MD as Consulting Physician (Hematology and Oncology)  SUMMARY OF ONCOLOGIC HISTORY:   Hodgkin's lymphoma   05/06/2014 Imaging CT scan of the abdomen show diffuse mesenteric lymphadenopathy.   05/07/2014 Imaging CT scan of the chest show right thoracic inlet lymphadenopathy   06/07/2014 Procedure She underwent ultrasound-guided core biopsy of the neck lymph node   06/07/2014 Pathology Results Accession: [id number] biopsy confirmed diagnosis of Hodgkin lymphoma.   06/15/2014 Imaging Echocardiogram showed preserved ejection fraction   07/09/2014 - 07/12/2014 Hospital Admission She was admitted to the hospital for severe anemia.   07/17/2014 -  Chemotherapy She received dose adjusted chemotherapy due to abnormal liver function tests and severe anemia    INTERVAL HISTORY: Please see below for problem oriented charting. She is seen today prior to cycle 1 of treatment. The patient and counseled her port placement recently. She complained of bilateral leg edema and leg pain. Denies recent infection. The patient denies any recent signs or symptoms of bleeding such as spontaneous epistaxis, hematuria or hematochezia.   REVIEW OF SYSTEMS:   Constitutional: Denies fevers, chills or abnormal weight loss Eyes: Denies blurriness of vision Ears, nose, mouth, throat, and face: Denies mucositis or sore throat Respiratory: Denies cough, dyspnea or wheezes Cardiovascular: Denies palpitation, chest discomfort  Gastrointestinal:  Denies nausea, heartburn or change in bowel habits Skin: Denies abnormal skin rashes Lymphatics: Denies new lymphadenopathy or easy bruising Neurological:Denies numbness, tingling or new  weaknesses Behavioral/Psych: Mood is stable, no new changes  All other systems were reviewed with the patient and are negative.  I have reviewed the past medical history, past surgical history, social history and family history with the patient and they are unchanged from previous note.  ALLERGIES:  has No Known Allergies.  MEDICATIONS:  Current Outpatient Prescriptions  Medication Sig Dispense Refill  . acetaminophen (TYLENOL) 325 MG tablet Take 2 tablets (650 mg total) by mouth every 4 (four) hours as needed for mild pain or fever. 30 tablet 0  . atazanavir (REYATAZ) 300 MG capsule Take 1 capsule (300 mg total) by mouth daily with breakfast. TAKE WITH NORVIR 30 capsule 5  . buPROPion (WELLBUTRIN XL) 150 MG 24 hr tablet Take 150 mg by mouth at bedtime.     . dronabinol (MARINOL) 10 MG capsule Take 1 capsule (10 mg total) by mouth 2 (two) times daily before a meal. 60 capsule 5  . emtricitabine-tenofovir (TRUVADA) 200-300 MG per tablet Take 1 tablet by mouth daily. 30 tablet 5  . hydrocortisone cream 1 % Apply 1 application topically daily.    Marland Kitchen lidocaine-prilocaine (EMLA) cream Apply to affected area once 30 g 3  . Naphazoline HCl (CLEAR EYES OP) Apply 2 drops to eye 2 (two) times daily.    . ondansetron (ZOFRAN) 8 MG tablet Take 1 tablet (8 mg total) by mouth 2 (two) times daily. Start the day after chemo for 3 days. Then take as needed for nausea or vomiting. 30 tablet 1  . prochlorperazine (COMPAZINE) 10 MG tablet Take 1 tablet (10 mg total) by mouth every 6 (six) hours as needed (Nausea or vomiting). 30 tablet 1  . ritonavir (NORVIR) 100 MG TABS tablet Take 1 tablet (100 mg total) by mouth daily with breakfast. TAKE WITH REYATAZ 30  tablet 5  . traZODone (DESYREL) 50 MG tablet Take 50 mg by mouth at bedtime.     No current facility-administered medications for this visit.    PHYSICAL EXAMINATION: ECOG PERFORMANCE STATUS: 1 - Symptomatic but completely ambulatory  Filed Vitals:    07/17/14 1031  BP: 115/80  Pulse: 88  Temp: 97.8 F (36.6 C)  Resp: 18   Filed Weights   07/17/14 1031  Weight: 105 lb 12.8 oz (47.991 kg)    GENERAL:alert, no distress and comfortable. She looks thin and cachectic SKIN: skin color, texture, turgor are normal, no rashes or significant lesions EYES: normal, Conjunctiva are pink and non-injected, sclera clear OROPHARYNX:no exudate, no erythema and lips, buccal mucosa, and tongue normal  NECK: supple, thyroid normal size, non-tender, without nodularity LYMPH:  no palpable lymphadenopathy in the cervical, axillary or inguinal LUNGS: clear to auscultation and percussion with normal breathing effort HEART: regular rate & rhythm and no murmurs and no lower extremity edema ABDOMEN:abdomen soft, non-tender and normal bowel sounds Musculoskeletal:no cyanosis of digits and no clubbing  NEURO: alert & oriented x 3 with fluent speech, no focal motor/sensory deficits  LABORATORY DATA:  I have reviewed the data as listed    Component Value Date/Time   NA 132* 07/10/2014 0400   NA 132* 06/27/2014 0930   K 3.8 07/10/2014 0400   K 4.0 06/27/2014 0930   CL 103 07/10/2014 0400   CO2 19 07/10/2014 0400   CO2 23 06/27/2014 0930   GLUCOSE 101* 07/10/2014 0400   GLUCOSE 89 06/27/2014 0930   BUN 12 07/10/2014 0400   BUN 8.2 06/27/2014 0930   CREATININE 0.60 07/10/2014 0400   CREATININE 0.6 06/27/2014 0930   CREATININE 0.87 01/25/2014 1626   CALCIUM 7.4* 07/10/2014 0400   CALCIUM 8.1* 06/27/2014 0930   PROT 5.4* 07/09/2014 0933   PROT 7.1 06/27/2014 0930   ALBUMIN 1.5* 07/09/2014 0933   ALBUMIN 1.7* 06/27/2014 0930   AST 29 07/09/2014 0933   AST 57* 06/27/2014 0930   ALT 33 07/09/2014 0933   ALT 81* 06/27/2014 0930   ALKPHOS 121* 07/09/2014 0933   ALKPHOS 237* 06/27/2014 0930   BILITOT 1.8* 07/09/2014 0933   BILITOT 1.93* 06/27/2014 0930   GFRNONAA >90 07/10/2014 0400   GFRNONAA >89 01/25/2014 1626   GFRAA >90 07/10/2014 0400   GFRAA  >89 01/25/2014 1626    No results found for: SPEP, UPEP  Lab Results  Component Value Date   WBC 8.0 07/17/2014   NEUTROABS 6.9* 07/17/2014   HGB 9.8* 07/17/2014   HCT 30.8* 07/17/2014   MCV 88.3 07/17/2014   PLT 244 07/17/2014      Chemistry      Component Value Date/Time   NA 132* 07/10/2014 0400   NA 132* 06/27/2014 0930   K 3.8 07/10/2014 0400   K 4.0 06/27/2014 0930   CL 103 07/10/2014 0400   CO2 19 07/10/2014 0400   CO2 23 06/27/2014 0930   BUN 12 07/10/2014 0400   BUN 8.2 06/27/2014 0930   CREATININE 0.60 07/10/2014 0400   CREATININE 0.6 06/27/2014 0930   CREATININE 0.87 01/25/2014 1626      Component Value Date/Time   CALCIUM 7.4* 07/10/2014 0400   CALCIUM 8.1* 06/27/2014 0930   ALKPHOS 121* 07/09/2014 0933   ALKPHOS 237* 06/27/2014 0930   AST 29 07/09/2014 0933   AST 57* 06/27/2014 0930   ALT 33 07/09/2014 0933   ALT 81* 06/27/2014 0930   BILITOT 1.8* 07/09/2014 2297  BILITOT 1.93* 06/27/2014 0930      ASSESSMENT & PLAN:  Hodgkin's lymphoma The patient is very noncompliant with treatment recommendation. Again, she cancelled her port placement yesterday due to fear. She had multiple delays on the start date of treatment due to multiple cancellation of for her tests. I told the patient, if the infusion nurse is not able to get adequate venous access, the chemotherapy might be console or rescheduled. She is aware of that. I will see her prior to cycle 2 of treatment. After multiple discussion with pharmacy, under the reduce her dose of chemotherapy based on recent abnormal liver function tests.   Anemia in chronic illness This is likely anemia of chronic disease. The patient denies recent history of bleeding such as epistaxis, hematuria or hematochezia. She is asymptomatic from the anemia. We will observe for now.  She does not require transfusion now.      Human immunodeficiency virus (HIV) disease Her last HIV viral load was undetectable. She is  compliant taking all her medications.    Bilateral leg pain The patient has history of multi-substance abuse. If she needs pain medicine, she will get her through her PCP. We will only issue Percocet as needed during infusion.   Bilateral leg edema This is related to low albumin, not related to congestive heart failure. I do not recommend diuretic therapy. Hopefully, once the chemotherapy gets started, this will resolve. In the meantime, I recommend low-salt diet and leg elevations.   Protein-calorie malnutrition, severe I recommended increase oral diet as tolerated.    No orders of the defined types were placed in this encounter.   All questions were answered. The patient knows to call the clinic with any problems, questions or concerns. No barriers to learning was detected. I spent 30 minutes counseling the patient face to face. The total time spent in the appointment was 40 minutes and more than 50% was on counseling and review of test results     [facility], [place], MD 07/17/2014 10:55 AM

## 2014-07-17 NOTE — Assessment & Plan Note (Signed)
The patient has history of multi-substance abuse. If she needs pain medicine, she will get her through her PCP. We will only issue Percocet as needed during infusion.

## 2014-07-17 NOTE — Telephone Encounter (Signed)
Gave avs & calendar for April. °

## 2014-07-17 NOTE — Assessment & Plan Note (Signed)
I recommended increase oral diet as tolerated.

## 2014-07-17 NOTE — Assessment & Plan Note (Signed)
Her last HIV viral load was undetectable. She is compliant taking all her medications.

## 2014-07-17 NOTE — Assessment & Plan Note (Signed)
This is likely anemia of chronic disease. The patient denies recent history of bleeding such as epistaxis, hematuria or hematochezia. She is asymptomatic from the anemia. We will observe for now.  She does not require transfusion now.   

## 2014-07-17 NOTE — Assessment & Plan Note (Signed)
The patient is very noncompliant with treatment recommendation. Again, she cancelled her port placement yesterday due to fear. She had multiple delays on the start date of treatment due to multiple cancellation of for her tests. I told the patient, if the infusion nurse is not able to get adequate venous access, the chemotherapy might be console or rescheduled. She is aware of that. I will see her prior to cycle 2 of treatment. After multiple discussion with pharmacy, under the reduce her dose of chemotherapy based on recent abnormal liver function tests.

## 2014-07-17 NOTE — Progress Notes (Signed)
Addendum to my note. She is found to have profound hypokalemia. I have given prescription of potassium replacement therapy to the patient.

## 2014-07-17 NOTE — Assessment & Plan Note (Signed)
This is related to low albumin, not related to congestive heart failure. I do not recommend diuretic therapy. Hopefully, once the chemotherapy gets started, this will resolve. In the meantime, I recommend low-salt diet and leg elevations.

## 2014-07-24 ENCOUNTER — Other Ambulatory Visit: Payer: Self-pay | Admitting: Hematology and Oncology

## 2014-07-26 ENCOUNTER — Other Ambulatory Visit: Payer: Self-pay | Admitting: Radiology

## 2014-07-27 ENCOUNTER — Ambulatory Visit (HOSPITAL_COMMUNITY)
Admission: RE | Admit: 2014-07-27 | Discharge: 2014-07-27 | Disposition: A | Discharge status: Home / Self Care | Payer: Medicaid Other | Source: Ambulatory Visit | Attending: Hematology and Oncology | Admitting: Hematology and Oncology

## 2014-07-27 ENCOUNTER — Other Ambulatory Visit: Payer: Self-pay | Admitting: Hematology and Oncology

## 2014-07-27 ENCOUNTER — Encounter (HOSPITAL_COMMUNITY): Payer: Self-pay

## 2014-07-27 ENCOUNTER — Telehealth: Payer: Self-pay

## 2014-07-27 ENCOUNTER — Ambulatory Visit (HOSPITAL_COMMUNITY)
Admission: RE | Admit: 2014-07-27 | Discharge: 2014-07-27 | Disposition: A | Discharge status: Home / Self Care | Payer: Medicaid Other | Source: Ambulatory Visit | Attending: Interventional Radiology | Admitting: Interventional Radiology

## 2014-07-27 DIAGNOSIS — F329 Major depressive disorder, single episode, unspecified: Secondary | ICD-10-CM | POA: Insufficient documentation

## 2014-07-27 DIAGNOSIS — C8190 Hodgkin lymphoma, unspecified, unspecified site: Secondary | ICD-10-CM

## 2014-07-27 DIAGNOSIS — Z452 Encounter for adjustment and management of vascular access device: Secondary | ICD-10-CM | POA: Insufficient documentation

## 2014-07-27 DIAGNOSIS — D649 Anemia, unspecified: Secondary | ICD-10-CM | POA: Diagnosis not present

## 2014-07-27 DIAGNOSIS — Z21 Asymptomatic human immunodeficiency virus [HIV] infection status: Secondary | ICD-10-CM | POA: Diagnosis not present

## 2014-07-27 DIAGNOSIS — F17290 Nicotine dependence, other tobacco product, uncomplicated: Secondary | ICD-10-CM | POA: Diagnosis not present

## 2014-07-27 DIAGNOSIS — Z79899 Other long term (current) drug therapy: Secondary | ICD-10-CM | POA: Insufficient documentation

## 2014-07-27 LAB — CBC WITH DIFFERENTIAL/PLATELET
BASOS ABS: 0 10*3/uL (ref 0.0–0.1)
Basophils Relative: 0 % (ref 0–1)
EOS ABS: 0.1 10*3/uL (ref 0.0–0.7)
Eosinophils Relative: 1 % (ref 0–5)
HCT: 31.9 % — ABNORMAL LOW (ref 36.0–46.0)
Hemoglobin: 9.7 g/dL — ABNORMAL LOW (ref 12.0–15.0)
LYMPHS PCT: 15 % (ref 12–46)
Lymphs Abs: 1.2 10*3/uL (ref 0.7–4.0)
MCH: 30.4 pg (ref 26.0–34.0)
MCHC: 30.4 g/dL (ref 30.0–36.0)
MCV: 100 fL (ref 78.0–100.0)
MONOS PCT: 8 % (ref 3–12)
Monocytes Absolute: 0.6 10*3/uL (ref 0.1–1.0)
NEUTROS PCT: 76 % (ref 43–77)
Neutro Abs: 5.8 10*3/uL (ref 1.7–7.7)
Platelets: 365 10*3/uL (ref 150–400)
RBC: 3.19 MIL/uL — ABNORMAL LOW (ref 3.87–5.11)
RDW: 25.9 % — AB (ref 11.5–15.5)
WBC: 7.7 10*3/uL (ref 4.0–10.5)

## 2014-07-27 LAB — BASIC METABOLIC PANEL
ANION GAP: 6 (ref 5–15)
BUN: 7 mg/dL (ref 6–23)
CHLORIDE: 106 mmol/L (ref 96–112)
CO2: 28 mmol/L (ref 19–32)
Calcium: 8.5 mg/dL (ref 8.4–10.5)
Creatinine, Ser: 0.44 mg/dL — ABNORMAL LOW (ref 0.50–1.10)
GFR calc non Af Amer: >90 mL/min (ref >90)
Glucose, Bld: 78 mg/dL (ref 70–99)
POTASSIUM: 3.9 mmol/L (ref 3.5–5.1)
Sodium: 140 mmol/L (ref 135–145)

## 2014-07-27 LAB — HCG, SERUM, QUALITATIVE: Preg, Serum: NEGATIVE

## 2014-07-27 MED ORDER — SODIUM CHLORIDE 0.9 % IV SOLN
INTRAVENOUS | Status: DC
  Administered 2014-07-27: 08:00:00 via INTRAVENOUS

## 2014-07-27 MED ORDER — HEPARIN SOD (PORK) LOCK FLUSH 100 UNIT/ML IV SOLN
INTRAVENOUS | Status: AC
Start: 2014-07-27 — End: 2014-07-27
  Filled 2014-07-27: qty 5

## 2014-07-27 MED ORDER — LIDOCAINE-EPINEPHRINE 2 %-1:100000 IJ SOLN
INTRAMUSCULAR | Status: AC
  Filled 2014-07-27: qty 1

## 2014-07-27 MED ORDER — CEFAZOLIN SODIUM-DEXTROSE 2-3 GM-% IV SOLR
2.0000 g | Freq: Once | INTRAVENOUS | Status: AC
  Administered 2014-07-27: 2 g via INTRAVENOUS

## 2014-07-27 MED ORDER — FENTANYL CITRATE 0.05 MG/ML IJ SOLN
INTRAMUSCULAR | Status: AC | PRN
  Administered 2014-07-27 (×4): 25 ug via INTRAVENOUS

## 2014-07-27 MED ORDER — MIDAZOLAM HCL 2 MG/2ML IJ SOLN
INTRAMUSCULAR | Status: AC | PRN
  Administered 2014-07-27 (×2): 1 mg via INTRAVENOUS
  Administered 2014-07-27 (×4): 0.5 mg via INTRAVENOUS

## 2014-07-27 MED ORDER — MIDAZOLAM HCL 2 MG/2ML IJ SOLN
INTRAMUSCULAR | Status: AC
Start: 2014-07-27 — End: 2014-07-27
  Filled 2014-07-27: qty 6

## 2014-07-27 MED ORDER — FENTANYL CITRATE 0.05 MG/ML IJ SOLN
INTRAMUSCULAR | Status: AC
  Filled 2014-07-27: qty 4

## 2014-07-27 MED ORDER — LIDOCAINE HCL 1 % IJ SOLN
INTRAMUSCULAR | Status: AC
  Filled 2014-07-27: qty 20

## 2014-07-27 MED ORDER — CEFAZOLIN SODIUM-DEXTROSE 2-3 GM-% IV SOLR
INTRAVENOUS | Status: AC
Start: 2014-07-27 — End: 2014-07-27
  Filled 2014-07-27: qty 50

## 2014-07-27 NOTE — Telephone Encounter (Signed)
Pt called to have her emla, zofran and compazine rx transferred to [facility] out patient pharmacy. Rx called into [facility] outpatient pharmacy. [facility] called and voice message left to cancel these.

## 2014-07-27 NOTE — H&P (Signed)
Chief Complaint: "I'm getting a port a cath"  Referring Physician(s): Gorsuch,Ni  History of Present Illness: [name] is a 29 y.o. female with history of Hodgkin's lymphoma who presents today for port a cath placement for chemotherapy.    Past Medical History  Diagnosis Date  . HIV (human immunodeficiency virus infection)   . Depression   . Anemia   . HSV (herpes simplex virus) infection   . History of shingles   . AIN III (anal intraepithelial neoplasia III)   . Condyloma acuminatum in female   . History of chronic bronchitis   . History of esophagitis     CANDIDA  . Periodontitis, chronic   . Hodgkin's lymphoma 06/12/2014  . Cancer     Hodgkin lymphoma  . Hypokalemia 07/17/2014    Past Surgical History  Procedure Laterality Date  . Dilation and curettage of uterus  2005    MISSED AB  . Examination under anesthesia N/A 09/23/2012    Procedure: EXAM UNDER ANESTHESIA;  Surgeon: Adin Hector, MD;  Location: [place];  Service: General;  Laterality: N/A;  . Laser ablation condolamata N/A 09/23/2012    Procedure: REMOVAL/ABLATION  ABLATION CONDOLAMATA WARTS;  Surgeon: Adin Hector, MD;  Location: [facility];  Service: General;  Laterality: N/A;    Allergies: Review of patient's allergies indicates no known allergies.  Medications: Prior to Admission medications   Medication Sig Start Date End Date Taking? Authorizing Provider  acetaminophen (TYLENOL) 325 MG tablet Take 2 tablets (650 mg total) by mouth every 4 (four) hours as needed for mild pain or fever. 07/12/14  Yes Robbie Lis, MD  atazanavir (REYATAZ) 300 MG capsule Take 1 capsule (300 mg total) by mouth daily with breakfast. TAKE WITH NORVIR 06/11/14  Yes Thayer Headings, MD  buPROPion (WELLBUTRIN XL) 150 MG 24 hr tablet Take 150 mg by mouth at bedtime.    Yes Historical Provider, MD  dronabinol (MARINOL) 10 MG capsule Take 1 capsule (10 mg total) by mouth 2 (two) times  daily before a meal. 06/12/14  Yes Thayer Headings, MD  emtricitabine-tenofovir (TRUVADA) 200-300 MG per tablet Take 1 tablet by mouth daily. 06/11/14  Yes Thayer Headings, MD  hydrocortisone cream 1 % Apply 1 application topically daily.   Yes Historical Provider, MD  Naphazoline HCl (CLEAR EYES OP) Apply 2 drops to eye 2 (two) times daily.   Yes Historical Provider, MD  potassium chloride SA (K-DUR,KLOR-CON) 20 MEQ tablet Take 1 tablet (20 mEq total) by mouth 2 (two) times daily. 07/17/14  Yes Heath Lark, MD  ritonavir (NORVIR) 100 MG TABS tablet Take 1 tablet (100 mg total) by mouth daily with breakfast. TAKE WITH REYATAZ 06/11/14  Yes Thayer Headings, MD  traZODone (DESYREL) 50 MG tablet Take 50 mg by mouth at bedtime.   Yes Historical Provider, MD  lidocaine-prilocaine (EMLA) cream Apply to affected area once 06/26/14   Heath Lark, MD  ondansetron (ZOFRAN) 8 MG tablet Take 1 tablet (8 mg total) by mouth 2 (two) times daily. Start the day after chemo for 3 days. Then take as needed for nausea or vomiting. 06/26/14   Heath Lark, MD  prochlorperazine (COMPAZINE) 10 MG tablet Take 1 tablet (10 mg total) by mouth every 6 (six) hours as needed (Nausea or vomiting). 06/26/14   Heath Lark, MD    Family History  Problem Relation Age of Onset  . Cancer Maternal Aunt  unknown ca  . Cancer Maternal Grandmother     unknown ca    History   Social History  . Marital Status: Single    Spouse Name: N/A  . Number of Children: N/A  . Years of Education: N/A   Social History Main Topics  . Smoking status: Current Every Day Smoker -- 0.00 packs/day for 7 years    Types: Cigars    Start date: 03/19/2014    Last Attempt to Quit: 03/19/2014  . Smokeless tobacco: Never Used     Comment: she smokes 3 Black and Mild Cigars daily  . Alcohol Use: No  . Drug Use: Yes    Special: Marijuana     Comment: 2 blunts per day  . Sexual Activity:    Partners: Male    Museum/gallery curator: None     Comment: pt.  declined condoms   Other Topics Concern  . Not on file   Social History Narrative      Review of Systems  Constitutional: Positive for fatigue. Negative for fever and chills.  Respiratory: Positive for cough. Negative for shortness of breath.   Cardiovascular: Positive for leg swelling. Negative for chest pain.  Gastrointestinal: Negative for nausea, vomiting, abdominal pain and blood in stool.  Genitourinary: Negative for dysuria and hematuria.  Musculoskeletal: Negative for back pain.  Neurological: Negative for headaches.  Hematological: Does not bruise/bleed easily.  Psychiatric/Behavioral: The patient is nervous/anxious.     Vital Signs: BP 139/98 mmHg  Pulse 70  Temp(Src) 98.6 F (37 C) (Oral)  Resp 16  SpO2 100%  Physical Exam  Constitutional: She is oriented to person, place, and time.  Thin BF in NAD  Cardiovascular: Normal rate and regular rhythm.   Pulmonary/Chest: Effort normal.  Few bibasilar crackles  Abdominal: Soft. Bowel sounds are normal. There is no tenderness.  Musculoskeletal: Normal range of motion. She exhibits edema.  Neurological: She is alert and oriented to person, place, and time.    Imaging: Ct Head Wo Contrast  07/09/2014   CLINICAL DATA:  Altered mental status, hypothermia. HIV and non-Hodgkin's lymphoma.  EXAM: CT HEAD WITHOUT CONTRAST  TECHNIQUE: Contiguous axial images were obtained from the base of the skull through the vertex without intravenous contrast.  COMPARISON:  CT of the head Sep 17, 2006 and PET-CT July 02, 2014  FINDINGS: The ventricles and sulci are normal. No intraparenchymal hemorrhage, mass effect nor midline shift. No acute large vascular territory infarcts.  No abnormal extra-axial fluid collections. Basal cisterns are patent.  No skull fracture. The included ocular globes and orbital contents are non-suspicious. The mastoid aircells and included paranasal sinuses are well-aerated.  IMPRESSION: Normal noncontrast CT of the  head.   Electronically Signed   By: Elon Alas   On: 07/09/2014 06:36   Nm Pet Image Initial (pi) Skull Base To Thigh  07/02/2014   CLINICAL DATA:  Initial Treatment strategy for Hodgkin's lymphoma.  EXAM: NUCLEAR MEDICINE PET SKULL BASE TO THIGH  TECHNIQUE: 5.1 mCi F-18 FDG was injected intravenously. Full-ring PET imaging was performed from the skull base to thigh after the radiotracer. CT data was obtained and used for attenuation correction and anatomic localization.  FASTING BLOOD GLUCOSE:  Value: 82 mg/dl  COMPARISON:  None.  FINDINGS: NECK  Hypermetabolic bilateral supraclavicular lymph nodes are identified. Index lymph node within the right supraclavicular region measures 1.9 cm and has an SUV max equal to 4.7.  CHEST  Hypermetabolic mediastinal and hilar adenopathy identified. Index right  hilar lymph node measures 1.3 cm and has an SUV max equal to 9.6. Hypermetabolic pre-vascular lymph node measures roughly 1 cm and has an SUV max equal to 5.1. No pleural effusion identified. 3 mm anterior right upper lobe nodule is too small to characterize, image 34/series 7.  ABDOMEN/PELVIS  There is no abnormal FDG uptake identified within the liver. The spleen is enlarged measuring 16 cm in cranial caudal dimension. There is diffuse tumor involvement throughout the spleen with SUVs as high as 6.3. The right kidney is normal. There is asymmetric left-sided nephromegaly and hydronephrosis. There is extensive hypermetabolic adenopathy within the abdomen and pelvis with mass effect upon the left renal collecting system likely causing hydronephrosis. Index right retrocrural lymph node measures 1.8 and has an SUV max equal to 10.3. Extensive retroperitoneal and mesenteric adenopathy is present. Index mesenteric lymph node measures 2.9 cm and has an SUV max equal to 10.5. Index retrocaval lymph node measures 1.7 cm and has an SUV max equal to 9.9. Hypermetabolic left common iliac node measures 2.1 and has an SUV max  equal to 11.8. Hypermetabolic right external iliac node measures 1.8 cm and has an SUV max equal to 11.8. No hypermetabolic inguinal adenopathy.  SKELETON  Extensive, heterogeneous increased uptake throughout the proximal appendicular and axial skeleton compatible with bone marrow involvement by tumor. Index lesion within the right clavicle head has an SUV max equal to 10.9. Posterior right iliac lesion has an SUV max equal to 8.0. Lesion within the proximal left femur has an SUV max equal to 6.4.  IMPRESSION: 1. Extensive hypermetabolic adenopathy is identified within the lower neck, chest, abdomen and pelvis. There is also extensive involvement of the enlarged spleen and within the proximal appendicular and axial skeleton. 2. Retroperitoneal or pelvic adenopathy obstructs the left renal collecting system.   Electronically Signed   By: Kerby Moors M.D.   On: 07/02/2014 08:50   Dg Chest Port 1 View  07/09/2014   CLINICAL DATA:  Altered mental status.  History of lymphoma.  EXAM: PORTABLE CHEST - 1 VIEW  COMPARISON:  05/04/2014  FINDINGS: A single AP portable view of the chest demonstrates no focal airspace consolidation or alveolar edema. The lungs are grossly clear. There is no large effusion or pneumothorax. Cardiac and mediastinal contours appear unremarkable.  IMPRESSION: No active disease.   Electronically Signed   By: Andreas Newport M.D.   On: 07/09/2014 06:30    Labs:  CBC:  Recent Labs  07/09/14 0933 07/10/14 0656 07/11/14 0345 07/17/14 1012  WBC 2.6* 4.5 3.9* 8.0  HGB 8.4* 5.9* 8.8* 9.8*  HCT 27.4* 19.1* 27.6* 30.8*  PLT 99* 186 169 244    COAGS:  Recent Labs  05/07/14 1446 06/07/14 0850 07/09/14 0933  INR 1.48 1.32 1.60*  APTT  --  37 42*    BMP:  Recent Labs  05/07/14 0455  07/09/14 0429 07/09/14 0933 07/10/14 0400 07/17/14 1012  NA 135  < > 132* 131* 132* 135*  K 3.5  < > 4.0 3.0* 3.8 2.6*  CL 106  --  96 103 103  --   CO2 22  < > 23 22 19 24   GLUCOSE  179*  < > 138* 125* 101* 96  BUN 9  < > 15 14 12  5.7*  CALCIUM 7.7*  < > 8.3* 7.2* 7.4* 8.1*  CREATININE 0.79  < > 0.51 0.35* 0.60 0.5*  GFRNONAA >90  --  >90 >90 >90  --  GFRAA >90  --  >90 >90 >90  --   < > = values in this interval not displayed.  LIVER FUNCTION TESTS:  Recent Labs  06/27/14 0930 07/09/14 0429 07/09/14 0933 07/17/14 1012  BILITOT 1.93* 2.9* 1.8* 2.28*  AST 57* 38* 29 24  ALT 81* 41* 33 55  ALKPHOS 237* 145* 121* 260*  PROT 7.1 6.6 5.4* 6.6  ALBUMIN 1.7* 1.8* 1.5* 1.5*    TUMOR MARKERS: No results for input(s): AFPTM, CEA, CA199, CHROMGRNA in the last 8760 hours.  Assessment and Plan: [name] is a 29 y.o. female with history of Hodgkin's lymphoma who presents today for port a cath placement for chemotherapy. Risks and benefits discussed with the patient including, but not limited to bleeding, infection, pneumothorax, or fibrin sheath development and need for additional procedures. All of the patient's questions were answered, patient is agreeable to proceed. Consent signed and in chart.     Signed: Autumn Messing 07/27/2014, 8:13 AM   I spent a total of 20 minutes face to face in clinical consultation, greater than 50% of which was counseling/coordinating care for port a cath placement.

## 2014-07-27 NOTE — Procedures (Signed)
Successful RT IJ POWER PORT TIP SVC/RA NO COMP READY FOR USE FULL REPORT IN PACS  

## 2014-07-27 NOTE — Discharge Instructions (Signed)

## 2014-07-31 ENCOUNTER — Other Ambulatory Visit: Payer: Self-pay

## 2014-07-31 ENCOUNTER — Ambulatory Visit (HOSPITAL_BASED_OUTPATIENT_CLINIC_OR_DEPARTMENT_OTHER): Payer: Self-pay

## 2014-07-31 ENCOUNTER — Ambulatory Visit: Payer: Self-pay

## 2014-07-31 DIAGNOSIS — C8190 Hodgkin lymphoma, unspecified, unspecified site: Secondary | ICD-10-CM

## 2014-07-31 DIAGNOSIS — Z5111 Encounter for antineoplastic chemotherapy: Secondary | ICD-10-CM

## 2014-07-31 MED ORDER — BLEOMYCIN SULFATE CHEMO INJECTION 30 UNIT
10.0000 [IU]/m2 | Freq: Once | INTRAMUSCULAR | Status: AC
Start: 2014-07-31 — End: 2014-07-31
  Administered 2014-07-31: 14 [IU] via INTRAVENOUS
  Filled 2014-07-31: qty 4.67

## 2014-07-31 MED ORDER — SODIUM CHLORIDE 0.9 % IV SOLN
375.0000 mg/m2 | Freq: Once | INTRAVENOUS | Status: AC
  Administered 2014-07-31: 520 mg via INTRAVENOUS
  Filled 2014-07-31: qty 26

## 2014-07-31 MED ORDER — VINBLASTINE SULFATE CHEMO INJECTION 1 MG/ML
3.0000 mg/m2 | Freq: Once | INTRAVENOUS | Status: AC
  Administered 2014-07-31: 4.1 mg via INTRAVENOUS
  Filled 2014-07-31: qty 4.1

## 2014-07-31 MED ORDER — SODIUM CHLORIDE 0.9 % IV SOLN
Freq: Once | INTRAVENOUS | Status: AC
  Administered 2014-07-31: 11:00:00 via INTRAVENOUS
  Filled 2014-07-31: qty 8

## 2014-07-31 MED ORDER — SODIUM CHLORIDE 0.9 % IJ SOLN
10.0000 mL | INTRAMUSCULAR | Status: DC | PRN
  Administered 2014-07-31: 10 mL
  Filled 2014-07-31: qty 10

## 2014-07-31 MED ORDER — SODIUM CHLORIDE 0.9 % IV SOLN
Freq: Once | INTRAVENOUS | Status: AC
  Administered 2014-07-31: 11:00:00 via INTRAVENOUS

## 2014-07-31 MED ORDER — HEPARIN SOD (PORK) LOCK FLUSH 100 UNIT/ML IV SOLN
500.0000 [IU] | Freq: Once | INTRAVENOUS | Status: AC | PRN
  Administered 2014-07-31: 500 [IU]
  Filled 2014-07-31: qty 5

## 2014-07-31 MED ORDER — DOXORUBICIN HCL CHEMO IV INJECTION 2 MG/ML
12.5000 mg/m2 | Freq: Once | INTRAVENOUS | Status: AC
  Administered 2014-07-31: 18 mg via INTRAVENOUS
  Filled 2014-07-31: qty 9

## 2014-07-31 NOTE — Patient Instructions (Signed)
[facility] Discharge Instructions for Patients Receiving Chemotherapy  Today you received the following chemotherapy agents adriamycin/bleomycin/vinblastine/dacarbazine   To help prevent nausea and vomiting after your treatment, we encourage you to take your nausea medication as directed   If you develop nausea and vomiting that is not controlled by your nausea medication, call the clinic.   BELOW ARE SYMPTOMS THAT SHOULD BE REPORTED IMMEDIATELY:  *FEVER GREATER THAN 100.5 F  *CHILLS WITH OR WITHOUT FEVER  NAUSEA AND VOMITING THAT IS NOT CONTROLLED WITH YOUR NAUSEA MEDICATION  *UNUSUAL SHORTNESS OF BREATH  *UNUSUAL BRUISING OR BLEEDING  TENDERNESS IN MOUTH AND THROAT WITH OR WITHOUT PRESENCE OF ULCERS  *URINARY PROBLEMS  *BOWEL PROBLEMS  UNUSUAL RASH Items with * indicate a potential emergency and should be followed up as soon as possible.  Feel free to call the clinic you have any questions or concerns. The clinic phone number is [phone].  Doxorubicin injection What is this medicine? DOXORUBICIN (dox oh ROO bi sin) is a chemotherapy drug. It is used to treat many kinds of cancer like Hodgkin's disease, leukemia, non-Hodgkin's lymphoma, neuroblastoma, sarcoma, and Wilms' tumor. It is also used to treat bladder cancer, breast cancer, lung cancer, ovarian cancer, stomach cancer, and thyroid cancer. This medicine may be used for other purposes; ask your health care provider or pharmacist if you have questions. COMMON BRAND NAME(S): Adriamycin, Adriamycin PFS, Adriamycin RDF, Rubex What should I tell my health care provider before I take this medicine? They need to know if you have any of these conditions: -blood disorders -heart disease, recent heart attack -infection (especially a virus infection such as chickenpox, cold sores, or herpes) -irregular heartbeat -liver disease -recent or ongoing radiation therapy -an unusual or allergic reaction to  doxorubicin, other chemotherapy agents, other medicines, foods, dyes, or preservatives -pregnant or trying to get pregnant -breast-feeding How should I use this medicine? This drug is given as an infusion into a vein. It is administered in a hospital or clinic by a specially trained health care professional. If you have pain, swelling, burning or any unusual feeling around the site of your injection, tell your health care professional right away. Talk to your pediatrician regarding the use of this medicine in children. Special care may be needed. Overdosage: If you think you have taken too much of this medicine contact a poison control center or emergency room at once. NOTE: This medicine is only for you. Do not share this medicine with others. What if I miss a dose? It is important not to miss your dose. Call your doctor or health care professional if you are unable to keep an appointment. What may interact with this medicine? Do not take this medicine with any of the following medications: -cisapride -droperidol -halofantrine -pimozide -zidovudine This medicine may also interact with the following medications: -chloroquine -chlorpromazine -clarithromycin -cyclophosphamide -cyclosporine -erythromycin -medicines for depression, anxiety, or psychotic disturbances -medicines for irregular heart beat like amiodarone, bepridil, dofetilide, encainide, flecainide, propafenone, quinidine -medicines for seizures like ethotoin, fosphenytoin, phenytoin -medicines for nausea, vomiting like dolasetron, ondansetron, palonosetron -medicines to increase blood counts like filgrastim, pegfilgrastim, sargramostim -methadone -methotrexate -pentamidine -progesterone -vaccines -verapamil Talk to your doctor or health care professional before taking any of these medicines: -acetaminophen -aspirin -ibuprofen -ketoprofen -naproxen This list may not describe all possible interactions. Give your  health care provider a list of all the medicines, herbs, non-prescription drugs, or dietary supplements you use. Also tell them if you smoke, drink alcohol, or use  illegal drugs. Some items may interact with your medicine. What should I watch for while using this medicine? Your condition will be monitored carefully while you are receiving this medicine. You will need important blood work done while you are taking this medicine. This drug may make you feel generally unwell. This is not uncommon, as chemotherapy can affect healthy cells as well as cancer cells. Report any side effects. Continue your course of treatment even though you feel ill unless your doctor tells you to stop. Your urine may turn red for a few days after your dose. This is not blood. If your urine is dark or brown, call your doctor. In some cases, you may be given additional medicines to help with side effects. Follow all directions for their use. Call your doctor or health care professional for advice if you get a fever, chills or sore throat, or other symptoms of a cold or flu. Do not treat yourself. This drug decreases your body's ability to fight infections. Try to avoid being around people who are sick. This medicine may increase your risk to bruise or bleed. Call your doctor or health care professional if you notice any unusual bleeding. Be careful brushing and flossing your teeth or using a toothpick because you may get an infection or bleed more easily. If you have any dental work done, tell your dentist you are receiving this medicine. Avoid taking products that contain aspirin, acetaminophen, ibuprofen, naproxen, or ketoprofen unless instructed by your doctor. These medicines may hide a fever. Men and women of childbearing age should use effective birth control methods while using taking this medicine. Do not become pregnant while taking this medicine. There is a potential for serious side effects to an unborn child. Talk to  your health care professional or pharmacist for more information. Do not breast-feed an infant while taking this medicine. Do not let others touch your urine or other body fluids for 5 days after each treatment with this medicine. Caregivers should wear latex gloves to avoid touching body fluids during this time. There is a maximum amount of this medicine you should receive throughout your life. The amount depends on the medical condition being treated and your overall health. Your doctor will watch how much of this medicine you receive in your lifetime. Tell your doctor if you have taken this medicine before. What side effects may I notice from receiving this medicine? Side effects that you should report to your doctor or health care professional as soon as possible: -allergic reactions like skin rash, itching or hives, swelling of the face, lips, or tongue -low blood counts - this medicine may decrease the number of white blood cells, red blood cells and platelets. You may be at increased risk for infections and bleeding. -signs of infection - fever or chills, cough, sore throat, pain or difficulty passing urine -signs of decreased platelets or bleeding - bruising, pinpoint red spots on the skin, black, tarry stools, blood in the urine -signs of decreased red blood cells - unusually weak or tired, fainting spells, lightheadedness -breathing problems -chest pain -fast, irregular heartbeat -mouth sores -nausea, vomiting -pain, swelling, redness at site where injected -pain, tingling, numbness in the hands or feet -swelling of ankles, feet, or hands -unusual bleeding or bruising Side effects that usually do not require medical attention (report to your doctor or health care professional if they continue or are bothersome): -diarrhea -facial flushing -hair loss -loss of appetite -missed menstrual periods -nail discoloration or damage -red  or watery eyes -red colored urine -stomach  upset This list may not describe all possible side effects. Call your doctor for medical advice about side effects. You may report side effects to FDA at 1-800-FDA-1088. Where should I keep my medicine? This drug is given in a hospital or clinic and will not be stored at home. NOTE: This sheet is a summary. It may not cover all possible information. If you have questions about this medicine, talk to your doctor, pharmacist, or health care provider.  2015, [employer] Standard. (2012-08-09 09:54:34)  Bleomycin injection What is this medicine? BLEOMYCIN (blee oh MYE sin) is a chemotherapy drug. It is used to treat many kinds of cancer like lymphoma, cervical cancer, head and neck cancer, and testicular cancer. It is also used to prevent and to treat fluid build-up around the lungs caused by some cancers. This medicine may be used for other purposes; ask your health care provider or pharmacist if you have questions. COMMON BRAND NAME(S): Blenoxane What should I tell my health care provider before I take this medicine? They need to know if you have any of these conditions: -cigarette smoker -kidney disease -lung disease -recent or ongoing radiation therapy -an unusual or allergic reaction to bleomycin, other chemotherapy agents, other medicines, foods, dyes, or preservatives -pregnant or trying to get pregnant -breast-feeding How should I use this medicine? This drug is given as an infusion into a vein or a body cavity. It can also be given as an injection into a muscle or under the skin. It is administered in a hospital or clinic by a specially trained health care professional. Talk to your pediatrician regarding the use of this medicine in children. Special care may be needed. Overdosage: If you think you have taken too much of this medicine contact a poison control center or emergency room at once. NOTE: This medicine is only for you. Do not share this medicine with others. What if I miss  a dose? It is important not to miss your dose. Call your doctor or health care professional if you are unable to keep an appointment. What may interact with this medicine? -certain antibiotics given by injection -cisplatin -cyclosporine -diuretics -foscarnet -medicines to increase blood counts like filgrastim, pegfilgrastim, sargramostim -vaccines This list may not describe all possible interactions. Give your health care provider a list of all the medicines, herbs, non-prescription drugs, or dietary supplements you use. Also tell them if you smoke, drink alcohol, or use illegal drugs. Some items may interact with your medicine. What should I watch for while using this medicine? Visit your doctor for checks on your progress. This drug may make you feel generally unwell. This is not uncommon, as chemotherapy can affect healthy cells as well as cancer cells. Report any side effects. Continue your course of treatment even though you feel ill unless your doctor tells you to stop. Call your doctor or health care professional for advice if you get a fever, chills or sore throat, or other symptoms of a cold or flu. Do not treat yourself. This drug decreases your body's ability to fight infections. Try to avoid being around people who are sick. Avoid taking products that contain aspirin, acetaminophen, ibuprofen, naproxen, or ketoprofen unless instructed by your doctor. These medicines may hide a fever. Do not become pregnant while taking this medicine. Women should inform their doctor if they wish to become pregnant or think they might be pregnant. There is a potential for serious side effects to an unborn child.  Talk to your health care professional or pharmacist for more information. Do not breast-feed an infant while taking this medicine. There is a maximum amount of this medicine you should receive throughout your life. The amount depends on the medical condition being treated and your overall health.  Your doctor will watch how much of this medicine you receive in your lifetime. Tell your doctor if you have taken this medicine before. What side effects may I notice from receiving this medicine? Side effects that you should report to your doctor or health care professional as soon as possible: -allergic reactions like skin rash, itching or hives, swelling of the face, lips, or tongue -breathing problems -chest pain -confusion -cough -fast, irregular heartbeat -feeling faint or lightheaded, falls -fever or chills -mouth sores -pain, tingling, numbness in the hands or feet -trouble passing urine or change in the amount of urine -yellowing of the eyes or skin Side effects that usually do not require medical attention (report to your doctor or health care professional if they continue or are bothersome): -darker skin color -hair loss -irritation at site where injected -loss of appetite -nail changes -nausea and vomiting -weight loss This list may not describe all possible side effects. Call your doctor for medical advice about side effects. You may report side effects to FDA at 1-800-FDA-1088. Where should I keep my medicine? This drug is given in a hospital or clinic and will not be stored at home. NOTE: This sheet is a summary. It may not cover all possible information. If you have questions about this medicine, talk to your doctor, pharmacist, or health care provider.  2015, [employer]/Gold Standard. (2012-08-09 09:36:48)  Vinblastine injection What is this medicine? VINBLASTINE (vin BLAS teen) is a chemotherapy drug. It slows the growth of cancer cells. This medicine is used to treat many types of cancer like breast cancer, testicular cancer, Hodgkin's disease, non-Hodgkin's lymphoma, and sarcoma. This medicine may be used for other purposes; ask your health care provider or pharmacist if you have questions. COMMON BRAND NAME(S): Velban What should I tell my health care provider  before I take this medicine? They need to know if you have any of these conditions: -blood disorders -dental disease -gout -infection (especially a virus infection such as chickenpox, cold sores, or herpes) -liver disease -lung disease -nervous system disease -recent or ongoing radiation therapy -an unusual or allergic reaction to vinblastine, other chemotherapy agents, other medicines, foods, dyes, or preservatives -pregnant or trying to get pregnant -breast-feeding How should I use this medicine? This drug is given as an infusion into a vein. It is administered in a hospital or clinic by a specially trained health care professional. If you have pain, swelling, burning or any unusual feeling around the site of your injection, tell your health care professional right away. Talk to your pediatrician regarding the use of this medicine in children. While this drug may be prescribed for selected conditions, precautions do apply. Overdosage: If you think you have taken too much of this medicine contact a poison control center or emergency room at once. NOTE: This medicine is only for you. Do not share this medicine with others. What if I miss a dose? It is important not to miss your dose. Call your doctor or health care professional if you are unable to keep an appointment. What may interact with this medicine? Do not take this medicine with any of the following medications: -erythromycin -itraconazole -mibefradil -voriconazole This medicine may also interact with the following medications: -  cyclosporine -fluconazole -ketoconazole -medicines for seizures like phenytoin -medicines to increase blood counts like filgrastim, pegfilgrastim, sargramostim -vaccines -verapamil Talk to your doctor or health care professional before taking any of these medicines: -acetaminophen -aspirin -ibuprofen -ketoprofen -naproxen This list may not describe all possible interactions. Give your health  care provider a list of all the medicines, herbs, non-prescription drugs, or dietary supplements you use. Also tell them if you smoke, drink alcohol, or use illegal drugs. Some items may interact with your medicine. What should I watch for while using this medicine? Your condition will be monitored carefully while you are receiving this medicine. You will need important blood work done while you are taking this medicine. This drug may make you feel generally unwell. This is not uncommon, as chemotherapy can affect healthy cells as well as cancer cells. Report any side effects. Continue your course of treatment even though you feel ill unless your doctor tells you to stop. In some cases, you may be given additional medicines to help with side effects. Follow all directions for their use. Call your doctor or health care professional for advice if you get a fever, chills or sore throat, or other symptoms of a cold or flu. Do not treat yourself. This drug decreases your body's ability to fight infections. Try to avoid being around people who are sick. This medicine may increase your risk to bruise or bleed. Call your doctor or health care professional if you notice any unusual bleeding. Be careful brushing and flossing your teeth or using a toothpick because you may get an infection or bleed more easily. If you have any dental work done, tell your dentist you are receiving this medicine. Avoid taking products that contain aspirin, acetaminophen, ibuprofen, naproxen, or ketoprofen unless instructed by your doctor. These medicines may hide a fever. Do not become pregnant while taking this medicine. Women should inform their doctor if they wish to become pregnant or think they might be pregnant. There is a potential for serious side effects to an unborn child. Talk to your health care professional or pharmacist for more information. Do not breast-feed an infant while taking this medicine. Men may have a lower  sperm count while taking this medicine. Talk to your doctor if you plan to father a child. What side effects may I notice from receiving this medicine? Side effects that you should report to your doctor or health care professional as soon as possible: -allergic reactions like skin rash, itching or hives, swelling of the face, lips, or tongue -low blood counts - This drug may decrease the number of white blood cells, red blood cells and platelets. You may be at increased risk for infections and bleeding. -signs of infection - fever or chills, cough, sore throat, pain or difficulty passing urine -signs of decreased platelets or bleeding - bruising, pinpoint red spots on the skin, black, tarry stools, nosebleeds -signs of decreased red blood cells - unusually weak or tired, fainting spells, lightheadedness -breathing problems -changes in hearing -change in the amount of urine -chest pain -high blood pressure -mouth sores -nausea and vomiting -pain, swelling, redness or irritation at the injection site -pain, tingling, numbness in the hands or feet -problems with balance, dizziness -seizures Side effects that usually do not require medical attention (report to your doctor or health care professional if they continue or are bothersome): -constipation -hair loss -jaw pain -loss of appetite -sensitivity to light -stomach pain -tumor pain This list may not describe all possible side effects.  Call your doctor for medical advice about side effects. You may report side effects to FDA at 1-800-FDA-1088. Where should I keep my medicine? This drug is given in a hospital or clinic and will not be stored at home. NOTE: This sheet is a summary. It may not cover all possible information. If you have questions about this medicine, talk to your doctor, pharmacist, or health care provider.  2015, [employer]/Gold Standard. (2008-01-09 17:15:59)  Dacarbazine, DTIC injection What is this  medicine? DACARBAZINE (da KAR ba zeen) is a chemotherapy drug. This medicine is used to treat skin cancer. It is also used with other medicines to treat Hodgkin's disease. This medicine may be used for other purposes; ask your health care provider or pharmacist if you have questions. COMMON BRAND NAME(S): DTIC-Dome What should I tell my health care provider before I take this medicine? They need to know if you have any of these conditions: -infection (especially virus infection such as chickenpox, cold sores, or herpes) -kidney disease -liver disease -low blood counts like low platelets, red blood cells, white blood cells -recent radiation therapy -an unusual or allergic reaction to dacarbazine, other chemotherapy agents, other medicines, foods, dyes, or preservatives -pregnant or trying to get pregnant -breast-feeding How should I use this medicine? This drug is given as an injection or infusion into a vein. It is administered in a hospital or clinic by a specially trained health care professional. Talk to your pediatrician regarding the use of this medicine in children. While this drug may be prescribed for selected conditions, precautions do apply. Overdosage: If you think you have taken too much of this medicine contact a poison control center or emergency room at once. NOTE: This medicine is only for you. Do not share this medicine with others. What if I miss a dose? It is important not to miss your dose. Call your doctor or health care professional if you are unable to keep an appointment. What may interact with this medicine? -medicines to increase blood counts like filgrastim, pegfilgrastim, sargramostim -vaccines This list may not describe all possible interactions. Give your health care provider a list of all the medicines, herbs, non-prescription drugs, or dietary supplements you use. Also tell them if you smoke, drink alcohol, or use illegal drugs. Some items may interact with  your medicine. What should I watch for while using this medicine? Your condition will be monitored carefully while you are receiving this medicine. You will need important blood work done while you are taking this medicine. This drug may make you feel generally unwell. This is not uncommon, as chemotherapy can affect healthy cells as well as cancer cells. Report any side effects. Continue your course of treatment even though you feel ill unless your doctor tells you to stop. Call your doctor or health care professional for advice if you get a fever, chills or sore throat, or other symptoms of a cold or flu. Do not treat yourself. This drug decreases your body's ability to fight infections. Try to avoid being around people who are sick. This medicine may increase your risk to bruise or bleed. Call your doctor or health care professional if you notice any unusual bleeding. Be careful brushing and flossing your teeth or using a toothpick because you may get an infection or bleed more easily. If you have any dental work done, tell your dentist you are receiving this medicine. Avoid taking products that contain aspirin, acetaminophen, ibuprofen, naproxen, or ketoprofen unless instructed by your doctor. These medicines  may hide a fever. Do not become pregnant while taking this medicine. Women should inform their doctor if they wish to become pregnant or think they might be pregnant. There is a potential for serious side effects to an unborn child. Talk to your health care professional or pharmacist for more information. Do not breast-feed an infant while taking this medicine. What side effects may I notice from receiving this medicine? Side effects that you should report to your doctor or health care professional as soon as possible: -allergic reactions like skin rash, itching or hives, swelling of the face, lips, or tongue -low blood counts - this medicine may decrease the number of white blood cells, red  blood cells and platelets. You may be at increased risk for infections and bleeding. -signs of infection - fever or chills, cough, sore throat, pain or difficulty passing urine -signs of decreased platelets or bleeding - bruising, pinpoint red spots on the skin, black, tarry stools, blood in the urine -signs of decreased red blood cells - unusually weak or tired, fainting spells, lightheadedness -breathing problems -muscle pains -pain at site where injected -trouble passing urine or change in the amount of urine -vomiting -yellowing of the eyes or skin Side effects that usually do not require medical attention (report to your doctor or health care professional if they continue or are bothersome): -diarrhea -hair loss -loss of appetite -nausea -skin more sensitive to sun or ultraviolet light -stomach upset This list may not describe all possible side effects. Call your doctor for medical advice about side effects. You may report side effects to FDA at 1-800-FDA-1088. Where should I keep my medicine? This drug is given in a hospital or clinic and will not be stored at home. NOTE: This sheet is a summary. It may not cover all possible information. If you have questions about this medicine, talk to your doctor, pharmacist, or health care provider.  2015, Elsevier/Gold Standard. (2007-08-02 16:56:39)

## 2014-08-10 ENCOUNTER — Encounter (HOSPITAL_COMMUNITY): Payer: Self-pay | Admitting: Emergency Medicine

## 2014-08-10 ENCOUNTER — Emergency Department (HOSPITAL_COMMUNITY): Payer: Medicaid Other

## 2014-08-10 ENCOUNTER — Emergency Department (HOSPITAL_COMMUNITY)
Admission: EM | Admit: 2014-08-10 | Discharge: 2014-08-11 | Disposition: A | Discharge status: Home / Self Care | Payer: Medicaid Other | Attending: Emergency Medicine | Admitting: Emergency Medicine

## 2014-08-10 DIAGNOSIS — Z862 Personal history of diseases of the blood and blood-forming organs and certain disorders involving the immune mechanism: Secondary | ICD-10-CM | POA: Diagnosis not present

## 2014-08-10 DIAGNOSIS — Z8619 Personal history of other infectious and parasitic diseases: Secondary | ICD-10-CM | POA: Insufficient documentation

## 2014-08-10 DIAGNOSIS — E876 Hypokalemia: Secondary | ICD-10-CM | POA: Diagnosis not present

## 2014-08-10 DIAGNOSIS — Y832 Surgical operation with anastomosis, bypass or graft as the cause of abnormal reaction of the patient, or of later complication, without mention of misadventure at the time of the procedure: Secondary | ICD-10-CM | POA: Insufficient documentation

## 2014-08-10 DIAGNOSIS — T82848A Pain from vascular prosthetic devices, implants and grafts, initial encounter: Secondary | ICD-10-CM | POA: Insufficient documentation

## 2014-08-10 DIAGNOSIS — Z8719 Personal history of other diseases of the digestive system: Secondary | ICD-10-CM | POA: Insufficient documentation

## 2014-08-10 DIAGNOSIS — Z85040 Personal history of malignant carcinoid tumor of rectum: Secondary | ICD-10-CM | POA: Insufficient documentation

## 2014-08-10 DIAGNOSIS — Z7952 Long term (current) use of systemic steroids: Secondary | ICD-10-CM | POA: Diagnosis not present

## 2014-08-10 DIAGNOSIS — Z79899 Other long term (current) drug therapy: Secondary | ICD-10-CM | POA: Diagnosis not present

## 2014-08-10 DIAGNOSIS — Z8571 Personal history of Hodgkin lymphoma: Secondary | ICD-10-CM | POA: Diagnosis not present

## 2014-08-10 DIAGNOSIS — F329 Major depressive disorder, single episode, unspecified: Secondary | ICD-10-CM | POA: Diagnosis not present

## 2014-08-10 DIAGNOSIS — Z720 Tobacco use: Secondary | ICD-10-CM | POA: Diagnosis not present

## 2014-08-10 DIAGNOSIS — Z21 Asymptomatic human immunodeficiency virus [HIV] infection status: Secondary | ICD-10-CM | POA: Diagnosis not present

## 2014-08-10 DIAGNOSIS — Z8709 Personal history of other diseases of the respiratory system: Secondary | ICD-10-CM | POA: Diagnosis not present

## 2014-08-10 DIAGNOSIS — R636 Underweight: Secondary | ICD-10-CM | POA: Diagnosis not present

## 2014-08-10 DIAGNOSIS — Z95828 Presence of other vascular implants and grafts: Secondary | ICD-10-CM

## 2014-08-10 NOTE — ED Notes (Signed)
Pt reports having port inserted April 1st and port was accessed a 1 1/2 ago. Pt states port is hurting her. No swelling noted at site.

## 2014-08-11 MED ORDER — LIDOCAINE-PRILOCAINE 2.5-2.5 % EX CREA
TOPICAL_CREAM | Freq: Once | CUTANEOUS | Status: DC
  Filled 2014-08-11: qty 5

## 2014-08-11 MED ORDER — HEPARIN SOD (PORK) LOCK FLUSH 100 UNIT/ML IV SOLN
500.0000 [IU] | Freq: Once | INTRAVENOUS | Status: DC
  Filled 2014-08-11: qty 5

## 2014-08-11 NOTE — Discharge Instructions (Signed)
1. Medications: usual home medications °2. Treatment: rest, drink plenty of fluids,  °3. Follow Up: Please followup with your primary doctor in 2 days for discussion of your diagnoses and further evaluation after today's visit; if you do not have a primary care doctor use the resource guide provided to find one; Please return to the ER for worsening symptoms ° ° ° ° °

## 2014-08-11 NOTE — ED Provider Notes (Signed)
CSN: [id number]     Arrival date & time 08/10/14  2142 History   First MD Initiated Contact with Patient 08/10/14 2322     Chief Complaint  Patient presents with  . Port difficulty      (Consider location/radiation/quality/duration/timing/severity/associated sxs/prior Treatment) The history is provided by the patient and medical records. No language interpreter was used.    [name] is a 29 y.o. female  with a hx of HIV, depression, anemia, Hodgkin's lymphoma diagnosed 06/12/2014 presents to the Emergency Department complaining of gradual, persistent, pain at the site of her port insertion onset this morning. Patient reports that she has been sleeping on her stomach on the board and on her right side in the last 24 hours and has noticed increased pain since this morning. She reports it was last accessed 1.5 weeks ago without difficulty. She has had one chemotherapy treatment through her port without difficulty. She denies swelling, erythema or induration to the site. She reports that her right arm is also sore but is not swollen.  Nothing seems to make the symptoms better or worse. No treatments prior to arrival. Patient denies fever, chills, headache, chest pain, shortness of breath, abdominal pain, nausea, vomiting, diarrhea weakness, dizziness, syncope.  Last infectious disease notes report that her HIV disease is under well control with undetectable viral load.    Past Medical History  Diagnosis Date  . HIV (human immunodeficiency virus infection)   . Depression   . Anemia   . HSV (herpes simplex virus) infection   . History of shingles   . AIN III (anal intraepithelial neoplasia III)   . Condyloma acuminatum in female   . History of chronic bronchitis   . History of esophagitis     CANDIDA  . Periodontitis, chronic   . Hodgkin's lymphoma 06/12/2014  . Cancer     Hodgkin lymphoma  . Hypokalemia 07/17/2014   Past Surgical History  Procedure Laterality Date  . Dilation and  curettage of uterus  2005    MISSED AB  . Examination under anesthesia N/A 09/23/2012    Procedure: EXAM UNDER ANESTHESIA;  Surgeon: Adin Hector, MD;  Location: [facility];  Service: General;  Laterality: N/A;  . Laser ablation condolamata N/A 09/23/2012    Procedure: REMOVAL/ABLATION  ABLATION CONDOLAMATA WARTS;  Surgeon: Adin Hector, MD;  Location: [facility];  Service: General;  Laterality: N/A;   Family History  Problem Relation Age of Onset  . Cancer Maternal Aunt     unknown ca  . Cancer Maternal Grandmother     unknown ca   History  Substance Use Topics  . Smoking status: Current Every Day Smoker -- 0.00 packs/day for 7 years    Types: Cigars    Start date: 03/19/2014    Last Attempt to Quit: 03/19/2014  . Smokeless tobacco: Never Used     Comment: she smokes 3 Black and Mild Cigars daily  . Alcohol Use: No   OB History    Gravida Para Term Preterm AB TAB SAB Ectopic Multiple Living   3 0 0  2  1 1   0     Review of Systems  Constitutional: Negative for fever, diaphoresis, appetite change, fatigue and unexpected weight change.  HENT: Negative for mouth sores.   Eyes: Negative for visual disturbance.  Respiratory: Negative for cough, chest tightness, shortness of breath and wheezing.   Cardiovascular: Negative for chest pain.  Gastrointestinal: Negative for nausea, vomiting, abdominal  pain, diarrhea and constipation.  Endocrine: Negative for polydipsia, polyphagia and polyuria.  Genitourinary: Negative for dysuria, urgency, frequency and hematuria.  Musculoskeletal: Negative for back pain and neck stiffness.  Skin: Negative for rash.  Allergic/Immunologic: Negative for immunocompromised state.  Neurological: Negative for syncope, light-headedness and headaches.  Hematological: Does not bruise/bleed easily.  Psychiatric/Behavioral: Negative for sleep disturbance. The patient is not nervous/anxious.       Allergies  Review of  patient's allergies indicates no known allergies.  Home Medications   Prior to Admission medications   Medication Sig Start Date End Date Taking? Authorizing Provider  acetaminophen (TYLENOL) 325 MG tablet Take 2 tablets (650 mg total) by mouth every 4 (four) hours as needed for mild pain or fever. 07/12/14  Yes Robbie Lis, MD  atazanavir (REYATAZ) 300 MG capsule Take 1 capsule (300 mg total) by mouth daily with breakfast. TAKE WITH NORVIR 06/11/14  Yes Thayer Headings, MD  buPROPion (WELLBUTRIN XL) 150 MG 24 hr tablet Take 150 mg by mouth at bedtime.    Yes Historical Provider, MD  dronabinol (MARINOL) 10 MG capsule Take 1 capsule (10 mg total) by mouth 2 (two) times daily before a meal. 06/12/14  Yes Thayer Headings, MD  emtricitabine-tenofovir (TRUVADA) 200-300 MG per tablet Take 1 tablet by mouth daily. 06/11/14  Yes Thayer Headings, MD  hydrocortisone cream 1 % Apply 1 application topically daily.   Yes Historical Provider, MD  Naphazoline HCl (CLEAR EYES OP) Apply 2 drops to eye 2 (two) times daily.   Yes Historical Provider, MD  potassium chloride SA (K-DUR,KLOR-CON) 20 MEQ tablet Take 1 tablet (20 mEq total) by mouth 2 (two) times daily. 07/17/14  Yes Heath Lark, MD  ritonavir (NORVIR) 100 MG TABS tablet Take 1 tablet (100 mg total) by mouth daily with breakfast. TAKE WITH REYATAZ 06/11/14  Yes Thayer Headings, MD  traZODone (DESYREL) 50 MG tablet Take 50 mg by mouth at bedtime.   Yes Historical Provider, MD  lidocaine-prilocaine (EMLA) cream Apply to affected area once 06/26/14   Heath Lark, MD  ondansetron (ZOFRAN) 8 MG tablet Take 1 tablet (8 mg total) by mouth 2 (two) times daily. Start the day after chemo for 3 days. Then take as needed for nausea or vomiting. 06/26/14   Heath Lark, MD  prochlorperazine (COMPAZINE) 10 MG tablet Take 1 tablet (10 mg total) by mouth every 6 (six) hours as needed (Nausea or vomiting). 06/26/14   Ni Gorsuch, MD   BP 125/73 mmHg  Pulse 108  Temp(Src) 98.7 F (37.1  C) (Oral)  Resp 20  SpO2 100% Physical Exam  Constitutional: She appears well-developed and well-nourished. No distress.  Awake, alert, nontoxic appearance Underweight and chronically ill appearing  HENT:  Head: Normocephalic and atraumatic.  Mouth/Throat: Oropharynx is clear and moist. No oropharyngeal exudate.  Eyes: Conjunctivae are normal. No scleral icterus.  Neck: Normal range of motion. Neck supple.  Cardiovascular: Normal rate, regular rhythm and intact distal pulses.   Pulmonary/Chest: Effort normal and breath sounds normal. No respiratory distress. She has no wheezes.  Equal chest expansion Clinical breath sounds Port visible and palpable in the right lateral chest without erythema, increased warmth or induration; incision site healing well; mild pain to palpation at the site  Abdominal: Soft. Bowel sounds are normal. She exhibits no mass. There is no tenderness. There is no rebound and no guarding.  Musculoskeletal: Normal range of motion. She exhibits no edema.  Neurological: She is alert.  Speech is clear and goal oriented Moves extremities without ataxia  Skin: Skin is warm and dry. She is not diaphoretic.  Psychiatric: She has a normal mood and affect.  Nursing note and vitals reviewed.   ED Course  Procedures (including critical care time) Labs Review Labs Reviewed - No data to display  Imaging Review Dg Chest 1 View  08/11/2014   CLINICAL DATA:  History of pain at port site.  EXAM: CHEST  1 VIEW  COMPARISON:  07/09/2014  FINDINGS: Tip of the right chest port in the mid SVC. No abnormality noted along the catheter tubing. The lungs are clear.The cardiomediastinal contours are normal. Pulmonary vasculature is normal. No consolidation, pleural effusion, or pneumothorax. No acute osseous abnormalities are seen.  IMPRESSION: Tip of the right chest port in the mid SVC. Port and catheter tubing appear radiographically intact. The lungs are clear.   Electronically Signed    By: Jeb Levering M.D.   On: 08/11/2014 00:08     EKG Interpretation None      MDM   Final diagnoses:  Port catheter in place   [name] presents with c/o pain to her port site.  No evidence of infection.  1:30 AM X-ray with port in place.  Will access and verify port flushes and draws to rule out a clot. If this is normal she may be discharged home.  Care transferred to Dr. Colin Rhein who will dispo after port flush.  BP 125/73 mmHg  Pulse 108  Temp(Src) 98.7 F (37.1 C) (Oral)  Resp 20  SpO2 100%    Abigail Butts, PA-C 08/11/14 0132  Debby Freiberg, MD 08/11/14 [phone]

## 2014-08-14 ENCOUNTER — Other Ambulatory Visit: Payer: Self-pay

## 2014-08-14 ENCOUNTER — Encounter: Payer: Self-pay | Admitting: Hematology and Oncology

## 2014-08-14 ENCOUNTER — Other Ambulatory Visit (HOSPITAL_COMMUNITY)
Admission: RE | Admit: 2014-08-14 | Discharge: 2014-08-14 | Disposition: A | Discharge status: Home / Self Care | Payer: Medicaid Other | Source: Ambulatory Visit | Attending: Hematology and Oncology | Admitting: Hematology and Oncology

## 2014-08-14 ENCOUNTER — Other Ambulatory Visit (HOSPITAL_BASED_OUTPATIENT_CLINIC_OR_DEPARTMENT_OTHER): Payer: Self-pay

## 2014-08-14 ENCOUNTER — Ambulatory Visit: Payer: Self-pay

## 2014-08-14 ENCOUNTER — Ambulatory Visit (HOSPITAL_BASED_OUTPATIENT_CLINIC_OR_DEPARTMENT_OTHER): Payer: Self-pay | Admitting: Hematology and Oncology

## 2014-08-14 ENCOUNTER — Telehealth: Payer: Self-pay | Admitting: Hematology and Oncology

## 2014-08-14 ENCOUNTER — Ambulatory Visit (HOSPITAL_BASED_OUTPATIENT_CLINIC_OR_DEPARTMENT_OTHER): Payer: Self-pay

## 2014-08-14 VITALS — BP 121/79 | HR 97 | Temp 98.2°F | Resp 18 | Ht 64.0 in | Wt 103.3 lb

## 2014-08-14 DIAGNOSIS — R7989 Other specified abnormal findings of blood chemistry: Secondary | ICD-10-CM

## 2014-08-14 DIAGNOSIS — C8190 Hodgkin lymphoma, unspecified, unspecified site: Secondary | ICD-10-CM | POA: Insufficient documentation

## 2014-08-14 DIAGNOSIS — Z5111 Encounter for antineoplastic chemotherapy: Secondary | ICD-10-CM

## 2014-08-14 DIAGNOSIS — D630 Anemia in neoplastic disease: Secondary | ICD-10-CM

## 2014-08-14 DIAGNOSIS — R600 Localized edema: Secondary | ICD-10-CM

## 2014-08-14 DIAGNOSIS — R945 Abnormal results of liver function studies: Secondary | ICD-10-CM

## 2014-08-14 DIAGNOSIS — D638 Anemia in other chronic diseases classified elsewhere: Secondary | ICD-10-CM

## 2014-08-14 DIAGNOSIS — B20 Human immunodeficiency virus [HIV] disease: Secondary | ICD-10-CM

## 2014-08-14 LAB — CBC WITH DIFFERENTIAL/PLATELET
BASO%: 0.3 % (ref 0.0–2.0)
Basophils Absolute: 0 10*3/uL (ref 0.0–0.1)
EOS%: 2.2 % (ref 0.0–7.0)
Eosinophils Absolute: 0.2 10*3/uL (ref 0.0–0.5)
HEMATOCRIT: 35.8 % (ref 34.8–46.6)
HGB: 11.5 g/dL — ABNORMAL LOW (ref 11.6–15.9)
LYMPH#: 1.4 10*3/uL (ref 0.9–3.3)
LYMPH%: 20.8 % (ref 14.0–49.7)
MCH: 31.3 pg (ref 25.1–34.0)
MCHC: 32.1 g/dL (ref 31.5–36.0)
MCV: 97.5 fL (ref 79.5–101.0)
MONO#: 0.4 10*3/uL (ref 0.1–0.9)
MONO%: 5.7 % (ref 0.0–14.0)
NEUT#: 4.7 10*3/uL (ref 1.5–6.5)
NEUT%: 71 % (ref 38.4–76.8)
Platelets: 231 10*3/uL (ref 145–400)
RBC: 3.67 10*6/uL — AB (ref 3.70–5.45)
RDW: 21.1 % — AB (ref 11.2–14.5)
WBC: 6.7 10*3/uL (ref 3.9–10.3)

## 2014-08-14 LAB — COMPREHENSIVE METABOLIC PANEL (CC13)
ALBUMIN: 3.5 g/dL (ref 3.5–5.0)
ALT: 71 U/L — AB (ref 0–55)
AST: 41 U/L — ABNORMAL HIGH (ref 5–34)
Alkaline Phosphatase: 145 U/L (ref 40–150)
Anion Gap: 14 mEq/L — ABNORMAL HIGH (ref 3–11)
BUN: 13 mg/dL (ref 7.0–26.0)
CALCIUM: 9.3 mg/dL (ref 8.4–10.4)
CHLORIDE: 102 meq/L (ref 98–109)
CO2: 22 mEq/L (ref 22–29)
CREATININE: 0.6 mg/dL (ref 0.6–1.1)
EGFR: >90 mL/min/{1.73_m2} (ref >90)
Glucose: 91 mg/dl (ref 70–140)
Potassium: 4.5 mEq/L (ref 3.5–5.1)
Sodium: 138 mEq/L (ref 136–145)
Total Bilirubin: 0.93 mg/dL (ref 0.20–1.20)
Total Protein: 8.3 g/dL (ref 6.4–8.3)

## 2014-08-14 LAB — HOLD TUBE, BLOOD BANK

## 2014-08-14 LAB — PREGNANCY, URINE: Preg Test, Ur: NEGATIVE

## 2014-08-14 MED ORDER — SODIUM CHLORIDE 0.9 % IV SOLN
375.0000 mg/m2 | Freq: Once | INTRAVENOUS | Status: AC
  Administered 2014-08-14: 520 mg via INTRAVENOUS
  Filled 2014-08-14: qty 26

## 2014-08-14 MED ORDER — LEUPROLIDE ACETATE 7.5 MG IM KIT
7.5000 mg | PACK | INTRAMUSCULAR | Status: DC
  Administered 2014-08-14: 7.5 mg via INTRAMUSCULAR
  Filled 2014-08-14: qty 7.5

## 2014-08-14 MED ORDER — SODIUM CHLORIDE 0.9 % IV SOLN
Freq: Once | INTRAVENOUS | Status: AC
  Administered 2014-08-14: 10:00:00 via INTRAVENOUS

## 2014-08-14 MED ORDER — SODIUM CHLORIDE 0.9 % IV SOLN
10.0000 [IU]/m2 | Freq: Once | INTRAVENOUS | Status: AC
  Administered 2014-08-14: 14 [IU] via INTRAVENOUS
  Filled 2014-08-14: qty 4.67

## 2014-08-14 MED ORDER — DOXORUBICIN HCL CHEMO IV INJECTION 2 MG/ML
12.5000 mg/m2 | Freq: Once | INTRAVENOUS | Status: AC
  Administered 2014-08-14: 18 mg via INTRAVENOUS
  Filled 2014-08-14: qty 9

## 2014-08-14 MED ORDER — VINBLASTINE SULFATE CHEMO INJECTION 1 MG/ML
3.0000 mg/m2 | Freq: Once | INTRAVENOUS | Status: AC
  Administered 2014-08-14: 4.1 mg via INTRAVENOUS
  Filled 2014-08-14: qty 4.1

## 2014-08-14 MED ORDER — SODIUM CHLORIDE 0.9 % IV SOLN
Freq: Once | INTRAVENOUS | Status: AC
  Administered 2014-08-14: 11:00:00 via INTRAVENOUS
  Filled 2014-08-14: qty 8

## 2014-08-14 MED ORDER — SODIUM CHLORIDE 0.9 % IJ SOLN
10.0000 mL | INTRAMUSCULAR | Status: DC | PRN
  Administered 2014-08-14: 10 mL
  Filled 2014-08-14: qty 10

## 2014-08-14 MED ORDER — HEPARIN SOD (PORK) LOCK FLUSH 100 UNIT/ML IV SOLN
500.0000 [IU] | Freq: Once | INTRAVENOUS | Status: AC | PRN
  Administered 2014-08-14: 500 [IU]
  Filled 2014-08-14: qty 5

## 2014-08-14 NOTE — Progress Notes (Signed)
[facility] OFFICE PROGRESS NOTE  Patient Care Team: Thayer Headings, MD as PCP - General (Internal Medicine) Thayer Headings, MD as PCP - Infectious Diseases (Infectious Diseases) Woodroe Mode, MD as Consulting Physician (Obstetrics and Gynecology) Heath Lark, MD as Consulting Physician (Hematology and Oncology)  SUMMARY OF ONCOLOGIC HISTORY:   Hodgkin's lymphoma   05/06/2014 Imaging CT scan of the abdomen show diffuse mesenteric lymphadenopathy.   05/07/2014 Imaging CT scan of the chest show right thoracic inlet lymphadenopathy   06/07/2014 Procedure She underwent ultrasound-guided core biopsy of the neck lymph node   06/07/2014 Pathology Results Accession: [id number] biopsy confirmed diagnosis of Hodgkin lymphoma.   06/15/2014 Imaging Echocardiogram showed preserved ejection fraction   07/09/2014 - 07/12/2014 Hospital Admission She was admitted to the hospital for severe anemia.   07/27/2014 Procedure She had placement of port   07/31/2014 -  Chemotherapy She received dose adjusted chemotherapy due to abnormal liver function tests and severe anemia. Treatment was delayed due to noncompliance    INTERVAL HISTORY: Please see below for problem oriented charting. She returns for day 15 cycle 1 of therapy. She is doing well. She complained of fatigue. Her leg swelling has resolved. She complains of irregular sweats at night. Denies recent infection.  REVIEW OF SYSTEMS:   Constitutional: Denies fevers, chills or abnormal weight loss Eyes: Denies blurriness of vision Ears, nose, mouth, throat, and face: Denies mucositis or sore throat Respiratory: Denies cough, dyspnea or wheezes Cardiovascular: Denies palpitation, chest discomfort or lower extremity swelling Gastrointestinal:  Denies nausea, heartburn or change in bowel habits Skin: Denies abnormal skin rashes Lymphatics: Denies new lymphadenopathy or easy bruising Neurological:Denies numbness, tingling or new  weaknesses Behavioral/Psych: Mood is stable, no new changes  All other systems were reviewed with the patient and are negative.  I have reviewed the past medical history, past surgical history, social history and family history with the patient and they are unchanged from previous note.  ALLERGIES:  has No Known Allergies.  MEDICATIONS:  Current Outpatient Prescriptions  Medication Sig Dispense Refill  . acetaminophen (TYLENOL) 325 MG tablet Take 2 tablets (650 mg total) by mouth every 4 (four) hours as needed for mild pain or fever. 30 tablet 0  . atazanavir (REYATAZ) 300 MG capsule Take 1 capsule (300 mg total) by mouth daily with breakfast. TAKE WITH NORVIR 30 capsule 5  . buPROPion (WELLBUTRIN XL) 150 MG 24 hr tablet Take 150 mg by mouth at bedtime.     . dronabinol (MARINOL) 10 MG capsule Take 1 capsule (10 mg total) by mouth 2 (two) times daily before a meal. 60 capsule 5  . emtricitabine-tenofovir (TRUVADA) 200-300 MG per tablet Take 1 tablet by mouth daily. 30 tablet 5  . hydrocortisone cream 1 % Apply 1 application topically daily.    Marland Kitchen lidocaine-prilocaine (EMLA) cream Apply to affected area once 30 g 3  . Naphazoline HCl (CLEAR EYES OP) Apply 2 drops to eye 2 (two) times daily.    . ondansetron (ZOFRAN) 8 MG tablet Take 1 tablet (8 mg total) by mouth 2 (two) times daily. Start the day after chemo for 3 days. Then take as needed for nausea or vomiting. 30 tablet 1  . potassium chloride SA (K-DUR,KLOR-CON) 20 MEQ tablet Take 1 tablet (20 mEq total) by mouth 2 (two) times daily. 60 tablet 0  . prochlorperazine (COMPAZINE) 10 MG tablet Take 1 tablet (10 mg total) by mouth every 6 (six) hours as needed (Nausea or vomiting).  30 tablet 1  . ritonavir (NORVIR) 100 MG TABS tablet Take 1 tablet (100 mg total) by mouth daily with breakfast. TAKE WITH REYATAZ 30 tablet 5  . traZODone (DESYREL) 50 MG tablet Take 50 mg by mouth at bedtime.     No current facility-administered medications for  this visit.    PHYSICAL EXAMINATION: ECOG PERFORMANCE STATUS: 1 - Symptomatic but completely ambulatory  Filed Vitals:   08/14/14 0920  BP: 121/79  Pulse: 97  Temp: 98.2 F (36.8 C)  Resp: 18   Filed Weights   08/14/14 0920  Weight: 103 lb 4.8 oz (46.857 kg)    GENERAL:alert, no distress and comfortable. She looks thin and cachectic SKIN: skin color, texture, turgor are normal, no rashes or significant lesions EYES: normal, Conjunctiva are pink and non-injected, mildly jaundiced. OROPHARYNX:no exudate, no erythema and lips, buccal mucosa, and tongue normal  NECK: supple, thyroid normal size, non-tender, without nodularity LYMPH:  no palpable lymphadenopathy in the cervical, axillary or inguinal LUNGS: clear to auscultation and percussion with normal breathing effort HEART: regular rate & rhythm and no murmurs and no lower extremity edema ABDOMEN:abdomen soft, non-tender and normal bowel sounds Musculoskeletal:no cyanosis of digits and no clubbing  NEURO: alert & oriented x 3 with fluent speech, no focal motor/sensory deficits  LABORATORY DATA:  I have reviewed the data as listed    Component Value Date/Time   NA 138 08/14/2014 0905   NA 140 07/27/2014 0835   K 4.5 08/14/2014 0905   K 3.9 07/27/2014 0835   CL 106 07/27/2014 0835   CO2 22 08/14/2014 0905   CO2 28 07/27/2014 0835   GLUCOSE 91 08/14/2014 0905   GLUCOSE 78 07/27/2014 0835   BUN 13.0 08/14/2014 0905   BUN 7 07/27/2014 0835   CREATININE 0.6 08/14/2014 0905   CREATININE 0.44* 07/27/2014 0835   CREATININE 0.87 01/25/2014 1626   CALCIUM 9.3 08/14/2014 0905   CALCIUM 8.5 07/27/2014 0835   PROT 8.3 08/14/2014 0905   PROT 5.4* 07/09/2014 0933   ALBUMIN 3.5 08/14/2014 0905   ALBUMIN 1.5* 07/09/2014 0933   AST 41* 08/14/2014 0905   AST 29 07/09/2014 0933   ALT 71* 08/14/2014 0905   ALT 33 07/09/2014 0933   ALKPHOS 145 08/14/2014 0905   ALKPHOS 121* 07/09/2014 0933   BILITOT 0.93 08/14/2014 0905    BILITOT 1.8* 07/09/2014 0933   GFRNONAA >90 07/27/2014 0835   GFRNONAA >89 01/25/2014 1626   GFRAA >90 07/27/2014 0835   GFRAA >89 01/25/2014 1626    No results found for: SPEP, UPEP  Lab Results  Component Value Date   WBC 6.7 08/14/2014   NEUTROABS 4.7 08/14/2014   HGB 11.5* 08/14/2014   HCT 35.8 08/14/2014   MCV 97.5 08/14/2014   PLT 231 08/14/2014      Chemistry      Component Value Date/Time   NA 138 08/14/2014 0905   NA 140 07/27/2014 0835   K 4.5 08/14/2014 0905   K 3.9 07/27/2014 0835   CL 106 07/27/2014 0835   CO2 22 08/14/2014 0905   CO2 28 07/27/2014 0835   BUN 13.0 08/14/2014 0905   BUN 7 07/27/2014 0835   CREATININE 0.6 08/14/2014 0905   CREATININE 0.44* 07/27/2014 0835   CREATININE 0.87 01/25/2014 1626      Component Value Date/Time   CALCIUM 9.3 08/14/2014 0905   CALCIUM 8.5 07/27/2014 0835   ALKPHOS 145 08/14/2014 0905   ALKPHOS 121* 07/09/2014 0933   AST 41*  08/14/2014 0905   AST 29 07/09/2014 0933   ALT 71* 08/14/2014 0905   ALT 33 07/09/2014 0933   BILITOT 0.93 08/14/2014 0905   BILITOT 1.8* 07/09/2014 0933      ASSESSMENT & PLAN:  Hodgkin's lymphoma The patient tolerated cycle one the treatment very well. She had perceived shrinkage of lymphadenopathy. Her leg swelling has improved. Anemia is resolving. Overall, she appears to be responding to treatment. I will continue 1 more treatment today with current dose adjustment. For cycle 2 day 1 of treatment next month, if she continues to have improvement of liver panel, I might consider increasing the dose of treatment. I will restage her after she completed 2 cycles of therapy. Due to her young age, I will continue Lupron injection every 4 weeks to suppress her reproductive function.   Anemia in chronic illness This is likely due to recent treatment. The patient denies recent history of bleeding such as epistaxis, hematuria or hematochezia. She is asymptomatic from the anemia. I will  observe for now.  She does not require transfusion now. I will continue the chemotherapy at current dose without dosage adjustment.  If the anemia gets progressive worse in the future, I might have to delay her treatment or adjust the chemotherapy dose.    Abnormal liver function tests She has high bilirubin level. I recommend close monitoring. She is not symptomatic. Her recent hepatitis screen was negative. I will continue dose adjustment of the chemotherapeutic agents by 50%.      Human immunodeficiency virus (HIV) disease Her last HIV viral load was undetectable. She is compliant taking all her medications.    Bilateral leg edema This is related to low albumin, not related to congestive heart failure. I do not recommend diuretic therapy. This has resolved since cycle 1 of treatment.    No orders of the defined types were placed in this encounter.   All questions were answered. The patient knows to call the clinic with any problems, questions or concerns. No barriers to learning was detected. I spent 30 minutes counseling the patient face to face. The total time spent in the appointment was 40 minutes and more than 50% was on counseling and review of test results     [facility], [place], MD 08/14/2014 9:51 AM

## 2014-08-14 NOTE — Assessment & Plan Note (Addendum)
The patient tolerated cycle one the treatment very well. She had perceived shrinkage of lymphadenopathy. Her leg swelling has improved. Anemia is resolving. Overall, she appears to be responding to treatment. I will continue 1 more treatment today with current dose adjustment. For cycle 2 day 1 of treatment next month, if she continues to have improvement of liver panel, I might consider increasing the dose of treatment. I will restage her after she completed 2 cycles of therapy. Due to her young age, I will continue Lupron injection every 4 weeks to suppress her reproductive function.

## 2014-08-14 NOTE — Assessment & Plan Note (Signed)
Her last HIV viral load was undetectable. She is compliant taking all her medications.

## 2014-08-14 NOTE — Assessment & Plan Note (Signed)

## 2014-08-14 NOTE — Patient Instructions (Addendum)
[facility] Discharge Instructions for Patients Receiving Chemotherapy  Today you received the following chemotherapy agents adriamycin/bleomycin/vinblastine/dacarbazine   To help prevent nausea and vomiting after your treatment, we encourage you to take your nausea medication as directed   If you develop nausea and vomiting that is not controlled by your nausea medication, call the clinic.   BELOW ARE SYMPTOMS THAT SHOULD BE REPORTED IMMEDIATELY:  *FEVER GREATER THAN 100.5 F  *CHILLS WITH OR WITHOUT FEVER  NAUSEA AND VOMITING THAT IS NOT CONTROLLED WITH YOUR NAUSEA MEDICATION  *UNUSUAL SHORTNESS OF BREATH  *UNUSUAL BRUISING OR BLEEDING  TENDERNESS IN MOUTH AND THROAT WITH OR WITHOUT PRESENCE OF ULCERS  *URINARY PROBLEMS  *BOWEL PROBLEMS  UNUSUAL RASH Items with * indicate a potential emergency and should be followed up as soon as possible.  Feel free to call the clinic you have any questions or concerns. The clinic phone number is [phone].  Doxorubicin injection What is this medicine? DOXORUBICIN (dox oh ROO bi sin) is a chemotherapy drug. It is used to treat many kinds of cancer like Hodgkin's disease, leukemia, non-Hodgkin's lymphoma, neuroblastoma, sarcoma, and Wilms' tumor. It is also used to treat bladder cancer, breast cancer, lung cancer, ovarian cancer, stomach cancer, and thyroid cancer. This medicine may be used for other purposes; ask your health care provider or pharmacist if you have questions. COMMON BRAND NAME(S): Adriamycin, Adriamycin PFS, Adriamycin RDF, Rubex What should I tell my health care provider before I take this medicine? They need to know if you have any of these conditions: -blood disorders -heart disease, recent heart attack -infection (especially a virus infection such as chickenpox, cold sores, or herpes) -irregular heartbeat -liver disease -recent or ongoing radiation therapy -an unusual or allergic reaction to  doxorubicin, other chemotherapy agents, other medicines, foods, dyes, or preservatives -pregnant or trying to get pregnant -breast-feeding How should I use this medicine? This drug is given as an infusion into a vein. It is administered in a hospital or clinic by a specially trained health care professional. If you have pain, swelling, burning or any unusual feeling around the site of your injection, tell your health care professional right away. Talk to your pediatrician regarding the use of this medicine in children. Special care may be needed. Overdosage: If you think you have taken too much of this medicine contact a poison control center or emergency room at once. NOTE: This medicine is only for you. Do not share this medicine with others. What if I miss a dose? It is important not to miss your dose. Call your doctor or health care professional if you are unable to keep an appointment. What may interact with this medicine? Do not take this medicine with any of the following medications: -cisapride -droperidol -halofantrine -pimozide -zidovudine This medicine may also interact with the following medications: -chloroquine -chlorpromazine -clarithromycin -cyclophosphamide -cyclosporine -erythromycin -medicines for depression, anxiety, or psychotic disturbances -medicines for irregular heart beat like amiodarone, bepridil, dofetilide, encainide, flecainide, propafenone, quinidine -medicines for seizures like ethotoin, fosphenytoin, phenytoin -medicines for nausea, vomiting like dolasetron, ondansetron, palonosetron -medicines to increase blood counts like filgrastim, pegfilgrastim, sargramostim -methadone -methotrexate -pentamidine -progesterone -vaccines -verapamil Talk to your doctor or health care professional before taking any of these medicines: -acetaminophen -aspirin -ibuprofen -ketoprofen -naproxen This list may not describe all possible interactions. Give your  health care provider a list of all the medicines, herbs, non-prescription drugs, or dietary supplements you use. Also tell them if you smoke, drink alcohol, or use  illegal drugs. Some items may interact with your medicine. What should I watch for while using this medicine? Your condition will be monitored carefully while you are receiving this medicine. You will need important blood work done while you are taking this medicine. This drug may make you feel generally unwell. This is not uncommon, as chemotherapy can affect healthy cells as well as cancer cells. Report any side effects. Continue your course of treatment even though you feel ill unless your doctor tells you to stop. Your urine may turn red for a few days after your dose. This is not blood. If your urine is dark or brown, call your doctor. In some cases, you may be given additional medicines to help with side effects. Follow all directions for their use. Call your doctor or health care professional for advice if you get a fever, chills or sore throat, or other symptoms of a cold or flu. Do not treat yourself. This drug decreases your body's ability to fight infections. Try to avoid being around people who are sick. This medicine may increase your risk to bruise or bleed. Call your doctor or health care professional if you notice any unusual bleeding. Be careful brushing and flossing your teeth or using a toothpick because you may get an infection or bleed more easily. If you have any dental work done, tell your dentist you are receiving this medicine. Avoid taking products that contain aspirin, acetaminophen, ibuprofen, naproxen, or ketoprofen unless instructed by your doctor. These medicines may hide a fever. Men and women of childbearing age should use effective birth control methods while using taking this medicine. Do not become pregnant while taking this medicine. There is a potential for serious side effects to an unborn child. Talk to  your health care professional or pharmacist for more information. Do not breast-feed an infant while taking this medicine. Do not let others touch your urine or other body fluids for 5 days after each treatment with this medicine. Caregivers should wear latex gloves to avoid touching body fluids during this time. There is a maximum amount of this medicine you should receive throughout your life. The amount depends on the medical condition being treated and your overall health. Your doctor will watch how much of this medicine you receive in your lifetime. Tell your doctor if you have taken this medicine before. What side effects may I notice from receiving this medicine? Side effects that you should report to your doctor or health care professional as soon as possible: -allergic reactions like skin rash, itching or hives, swelling of the face, lips, or tongue -low blood counts - this medicine may decrease the number of white blood cells, red blood cells and platelets. You may be at increased risk for infections and bleeding. -signs of infection - fever or chills, cough, sore throat, pain or difficulty passing urine -signs of decreased platelets or bleeding - bruising, pinpoint red spots on the skin, black, tarry stools, blood in the urine -signs of decreased red blood cells - unusually weak or tired, fainting spells, lightheadedness -breathing problems -chest pain -fast, irregular heartbeat -mouth sores -nausea, vomiting -pain, swelling, redness at site where injected -pain, tingling, numbness in the hands or feet -swelling of ankles, feet, or hands -unusual bleeding or bruising Side effects that usually do not require medical attention (report to your doctor or health care professional if they continue or are bothersome): -diarrhea -facial flushing -hair loss -loss of appetite -missed menstrual periods -nail discoloration or damage -red  or watery eyes -red colored urine -stomach  upset This list may not describe all possible side effects. Call your doctor for medical advice about side effects. You may report side effects to FDA at 1-800-FDA-1088. Where should I keep my medicine? This drug is given in a hospital or clinic and will not be stored at home. NOTE: This sheet is a summary. It may not cover all possible information. If you have questions about this medicine, talk to your doctor, pharmacist, or health care provider.  2015, [employer]. (2012-08-09 09:54:34)  Bleomycin injection What is this medicine? BLEOMYCIN (blee oh MYE sin) is a chemotherapy drug. It is used to treat many kinds of cancer like lymphoma, cervical cancer, head and neck cancer, and testicular cancer. It is also used to prevent and to treat fluid build-up around the lungs caused by some cancers. This medicine may be used for other purposes; ask your health care provider or pharmacist if you have questions. COMMON BRAND NAME(S): Blenoxane What should I tell my health care provider before I take this medicine? They need to know if you have any of these conditions: -cigarette smoker -kidney disease -lung disease -recent or ongoing radiation therapy -an unusual or allergic reaction to bleomycin, other chemotherapy agents, other medicines, foods, dyes, or preservatives -pregnant or trying to get pregnant -breast-feeding How should I use this medicine? This drug is given as an infusion into a vein or a body cavity. It can also be given as an injection into a muscle or under the skin. It is administered in a hospital or clinic by a specially trained health care professional. Talk to your pediatrician regarding the use of this medicine in children. Special care may be needed. Overdosage: If you think you have taken too much of this medicine contact a poison control center or emergency room at once. NOTE: This medicine is only for you. Do not share this medicine with others. What if I miss  a dose? It is important not to miss your dose. Call your doctor or health care professional if you are unable to keep an appointment. What may interact with this medicine? -certain antibiotics given by injection -cisplatin -cyclosporine -diuretics -foscarnet -medicines to increase blood counts like filgrastim, pegfilgrastim, sargramostim -vaccines This list may not describe all possible interactions. Give your health care provider a list of all the medicines, herbs, non-prescription drugs, or dietary supplements you use. Also tell them if you smoke, drink alcohol, or use illegal drugs. Some items may interact with your medicine. What should I watch for while using this medicine? Visit your doctor for checks on your progress. This drug may make you feel generally unwell. This is not uncommon, as chemotherapy can affect healthy cells as well as cancer cells. Report any side effects. Continue your course of treatment even though you feel ill unless your doctor tells you to stop. Call your doctor or health care professional for advice if you get a fever, chills or sore throat, or other symptoms of a cold or flu. Do not treat yourself. This drug decreases your body's ability to fight infections. Try to avoid being around people who are sick. Avoid taking products that contain aspirin, acetaminophen, ibuprofen, naproxen, or ketoprofen unless instructed by your doctor. These medicines may hide a fever. Do not become pregnant while taking this medicine. Women should inform their doctor if they wish to become pregnant or think they might be pregnant. There is a potential for serious side effects to an unborn child.  Talk to your health care professional or pharmacist for more information. Do not breast-feed an infant while taking this medicine. There is a maximum amount of this medicine you should receive throughout your life. The amount depends on the medical condition being treated and your overall health.  Your doctor will watch how much of this medicine you receive in your lifetime. Tell your doctor if you have taken this medicine before. What side effects may I notice from receiving this medicine? Side effects that you should report to your doctor or health care professional as soon as possible: -allergic reactions like skin rash, itching or hives, swelling of the face, lips, or tongue -breathing problems -chest pain -confusion -cough -fast, irregular heartbeat -feeling faint or lightheaded, falls -fever or chills -mouth sores -pain, tingling, numbness in the hands or feet -trouble passing urine or change in the amount of urine -yellowing of the eyes or skin Side effects that usually do not require medical attention (report to your doctor or health care professional if they continue or are bothersome): -darker skin color -hair loss -irritation at site where injected -loss of appetite -nail changes -nausea and vomiting -weight loss This list may not describe all possible side effects. Call your doctor for medical advice about side effects. You may report side effects to FDA at 1-800-FDA-1088. Where should I keep my medicine? This drug is given in a hospital or clinic and will not be stored at home. NOTE: This sheet is a summary. It may not cover all possible information. If you have questions about this medicine, talk to your doctor, pharmacist, or health care provider.  2015, [employer]. (2012-08-09 09:36:48)  Vinblastine injection What is this medicine? VINBLASTINE (vin BLAS teen) is a chemotherapy drug. It slows the growth of cancer cells. This medicine is used to treat many types of cancer like breast cancer, testicular cancer, Hodgkin's disease, non-Hodgkin's lymphoma, and sarcoma. This medicine may be used for other purposes; ask your health care provider or pharmacist if you have questions. COMMON BRAND NAME(S): Velban What should I tell my health care provider  before I take this medicine? They need to know if you have any of these conditions: -blood disorders -dental disease -gout -infection (especially a virus infection such as chickenpox, cold sores, or herpes) -liver disease -lung disease -nervous system disease -recent or ongoing radiation therapy -an unusual or allergic reaction to vinblastine, other chemotherapy agents, other medicines, foods, dyes, or preservatives -pregnant or trying to get pregnant -breast-feeding How should I use this medicine? This drug is given as an infusion into a vein. It is administered in a hospital or clinic by a specially trained health care professional. If you have pain, swelling, burning or any unusual feeling around the site of your injection, tell your health care professional right away. Talk to your pediatrician regarding the use of this medicine in children. While this drug may be prescribed for selected conditions, precautions do apply. Overdosage: If you think you have taken too much of this medicine contact a poison control center or emergency room at once. NOTE: This medicine is only for you. Do not share this medicine with others. What if I miss a dose? It is important not to miss your dose. Call your doctor or health care professional if you are unable to keep an appointment. What may interact with this medicine? Do not take this medicine with any of the following medications: -erythromycin -itraconazole -mibefradil -voriconazole This medicine may also interact with the following medications: -  cyclosporine -fluconazole -ketoconazole -medicines for seizures like phenytoin -medicines to increase blood counts like filgrastim, pegfilgrastim, sargramostim -vaccines -verapamil Talk to your doctor or health care professional before taking any of these medicines: -acetaminophen -aspirin -ibuprofen -ketoprofen -naproxen This list may not describe all possible interactions. Give your health  care provider a list of all the medicines, herbs, non-prescription drugs, or dietary supplements you use. Also tell them if you smoke, drink alcohol, or use illegal drugs. Some items may interact with your medicine. What should I watch for while using this medicine? Your condition will be monitored carefully while you are receiving this medicine. You will need important blood work done while you are taking this medicine. This drug may make you feel generally unwell. This is not uncommon, as chemotherapy can affect healthy cells as well as cancer cells. Report any side effects. Continue your course of treatment even though you feel ill unless your doctor tells you to stop. In some cases, you may be given additional medicines to help with side effects. Follow all directions for their use. Call your doctor or health care professional for advice if you get a fever, chills or sore throat, or other symptoms of a cold or flu. Do not treat yourself. This drug decreases your body's ability to fight infections. Try to avoid being around people who are sick. This medicine may increase your risk to bruise or bleed. Call your doctor or health care professional if you notice any unusual bleeding. Be careful brushing and flossing your teeth or using a toothpick because you may get an infection or bleed more easily. If you have any dental work done, tell your dentist you are receiving this medicine. Avoid taking products that contain aspirin, acetaminophen, ibuprofen, naproxen, or ketoprofen unless instructed by your doctor. These medicines may hide a fever. Do not become pregnant while taking this medicine. Women should inform their doctor if they wish to become pregnant or think they might be pregnant. There is a potential for serious side effects to an unborn child. Talk to your health care professional or pharmacist for more information. Do not breast-feed an infant while taking this medicine. Men may have a lower  sperm count while taking this medicine. Talk to your doctor if you plan to father a child. What side effects may I notice from receiving this medicine? Side effects that you should report to your doctor or health care professional as soon as possible: -allergic reactions like skin rash, itching or hives, swelling of the face, lips, or tongue -low blood counts - This drug may decrease the number of white blood cells, red blood cells and platelets. You may be at increased risk for infections and bleeding. -signs of infection - fever or chills, cough, sore throat, pain or difficulty passing urine -signs of decreased platelets or bleeding - bruising, pinpoint red spots on the skin, black, tarry stools, nosebleeds -signs of decreased red blood cells - unusually weak or tired, fainting spells, lightheadedness -breathing problems -changes in hearing -change in the amount of urine -chest pain -high blood pressure -mouth sores -nausea and vomiting -pain, swelling, redness or irritation at the injection site -pain, tingling, numbness in the hands or feet -problems with balance, dizziness -seizures Side effects that usually do not require medical attention (report to your doctor or health care professional if they continue or are bothersome): -constipation -hair loss -jaw pain -loss of appetite -sensitivity to light -stomach pain -tumor pain This list may not describe all possible side effects.  Call your doctor for medical advice about side effects. You may report side effects to FDA at 1-800-FDA-1088. Where should I keep my medicine? This drug is given in a hospital or clinic and will not be stored at home. NOTE: This sheet is a summary. It may not cover all possible information. If you have questions about this medicine, talk to your doctor, pharmacist, or health care provider.  2015, [employer]. (2008-01-09 17:15:59)  Dacarbazine, DTIC injection What is this  medicine? DACARBAZINE (da KAR ba zeen) is a chemotherapy drug. This medicine is used to treat skin cancer. It is also used with other medicines to treat Hodgkin's disease. This medicine may be used for other purposes; ask your health care provider or pharmacist if you have questions. COMMON BRAND NAME(S): DTIC-Dome What should I tell my health care provider before I take this medicine? They need to know if you have any of these conditions: -infection (especially virus infection such as chickenpox, cold sores, or herpes) -kidney disease -liver disease -low blood counts like low platelets, red blood cells, white blood cells -recent radiation therapy -an unusual or allergic reaction to dacarbazine, other chemotherapy agents, other medicines, foods, dyes, or preservatives -pregnant or trying to get pregnant -breast-feeding How should I use this medicine? This drug is given as an injection or infusion into a vein. It is administered in a hospital or clinic by a specially trained health care professional. Talk to your pediatrician regarding the use of this medicine in children. While this drug may be prescribed for selected conditions, precautions do apply. Overdosage: If you think you have taken too much of this medicine contact a poison control center or emergency room at once. NOTE: This medicine is only for you. Do not share this medicine with others. What if I miss a dose? It is important not to miss your dose. Call your doctor or health care professional if you are unable to keep an appointment. What may interact with this medicine? -medicines to increase blood counts like filgrastim, pegfilgrastim, sargramostim -vaccines This list may not describe all possible interactions. Give your health care provider a list of all the medicines, herbs, non-prescription drugs, or dietary supplements you use. Also tell them if you smoke, drink alcohol, or use illegal drugs. Some items may interact with  your medicine. What should I watch for while using this medicine? Your condition will be monitored carefully while you are receiving this medicine. You will need important blood work done while you are taking this medicine. This drug may make you feel generally unwell. This is not uncommon, as chemotherapy can affect healthy cells as well as cancer cells. Report any side effects. Continue your course of treatment even though you feel ill unless your doctor tells you to stop. Call your doctor or health care professional for advice if you get a fever, chills or sore throat, or other symptoms of a cold or flu. Do not treat yourself. This drug decreases your body's ability to fight infections. Try to avoid being around people who are sick. This medicine may increase your risk to bruise or bleed. Call your doctor or health care professional if you notice any unusual bleeding. Be careful brushing and flossing your teeth or using a toothpick because you may get an infection or bleed more easily. If you have any dental work done, tell your dentist you are receiving this medicine. Avoid taking products that contain aspirin, acetaminophen, ibuprofen, naproxen, or ketoprofen unless instructed by your doctor. These medicines  may hide a fever. Do not become pregnant while taking this medicine. Women should inform their doctor if they wish to become pregnant or think they might be pregnant. There is a potential for serious side effects to an unborn child. Talk to your health care professional or pharmacist for more information. Do not breast-feed an infant while taking this medicine. What side effects may I notice from receiving this medicine? Side effects that you should report to your doctor or health care professional as soon as possible: -allergic reactions like skin rash, itching or hives, swelling of the face, lips, or tongue -low blood counts - this medicine may decrease the number of white blood cells, red  blood cells and platelets. You may be at increased risk for infections and bleeding. -signs of infection - fever or chills, cough, sore throat, pain or difficulty passing urine -signs of decreased platelets or bleeding - bruising, pinpoint red spots on the skin, black, tarry stools, blood in the urine -signs of decreased red blood cells - unusually weak or tired, fainting spells, lightheadedness -breathing problems -muscle pains -pain at site where injected -trouble passing urine or change in the amount of urine -vomiting -yellowing of the eyes or skin Side effects that usually do not require medical attention (report to your doctor or health care professional if they continue or are bothersome): -diarrhea -hair loss -loss of appetite -nausea -skin more sensitive to sun or ultraviolet light -stomach upset This list may not describe all possible side effects. Call your doctor for medical advice about side effects. You may report side effects to FDA at 1-800-FDA-1088. Where should I keep my medicine? This drug is given in a hospital or clinic and will not be stored at home. NOTE: This sheet is a summary. It may not cover all possible information. If you have questions about this medicine, talk to your doctor, pharmacist, or health care provider.  2015, Elsevier/Gold Standard. (2007-08-02 16:56:39) Leuprolide depot injection or implant What is this medicine? LEUPROLIDE (loo PROE lide) is a man-made protein that acts like a natural hormone in the body. It decreases testosterone in men and decreases estrogen in women. In men, this medicine is used to treat advanced prostate cancer. In women, some forms of this medicine may be used to treat endometriosis, uterine fibroids, or other female hormone-related problems. This medicine may be used for other purposes; ask your health care provider or pharmacist if you have questions. COMMON BRAND NAME(S): Eligard, Lupron Depot, Lupron Depot-Ped,  Viadur What should I tell my health care provider before I take this medicine? They need to know if you have any of these conditions: -diabetes -heart disease or previous heart attack -high blood pressure -high cholesterol -osteoporosis -pain or difficulty passing urine -spinal cord metastasis -stroke -tobacco smoker -unusual vaginal bleeding (women) -an unusual or allergic reaction to leuprolide, benzyl alcohol, other medicines, foods, dyes, or preservatives -pregnant or trying to get pregnant -breast-feeding How should I use this medicine? This medicine is for injection into a muscle or for implant or injection under the skin. It is given by a health care professional in a hospital or clinic setting. The specific product will determine how it will be given to you. Make sure you understand which product you receive and how often you will receive it. Talk to your pediatrician regarding the use of this medicine in children. Special care may be needed. Overdosage: If you think you have taken too much of this medicine contact a poison control center  or emergency room at once. NOTE: This medicine is only for you. Do not share this medicine with others. What if I miss a dose? It is important not to miss a dose. Call your doctor or health care professional if you are unable to keep an appointment. Depot injections: Depot injections are given either once-monthly, every 12 weeks, every 16 weeks, or every 24 weeks depending on the product you are prescribed. The product you are prescribed will be based on if you are female or female, and your condition. Make sure you understand your product and dosing. Implant dosing: The implant is removed and replaced once a year. The implant is only used in males. What may interact with this medicine? Do not take this medicine with any of the following medications: -chasteberry This medicine may also interact with the following medications: -herbal or dietary  supplements, like black cohosh or DHEA -female hormones, like estrogens or progestins and birth control pills, patches, rings, or injections -female hormones, like testosterone This list may not describe all possible interactions. Give your health care provider a list of all the medicines, herbs, non-prescription drugs, or dietary supplements you use. Also tell them if you smoke, drink alcohol, or use illegal drugs. Some items may interact with your medicine. What should I watch for while using this medicine? Visit your doctor or health care professional for regular checks on your progress. During the first weeks of treatment, your symptoms may get worse, but then will improve as you continue your treatment. You may get hot flashes, increased bone pain, increased difficulty passing urine, or an aggravation of nerve symptoms. Discuss these effects with your doctor or health care professional, some of them may improve with continued use of this medicine. Female patients may experience a menstrual cycle or spotting during the first months of therapy with this medicine. If this continues, contact your doctor or health care professional. What side effects may I notice from receiving this medicine? Side effects that you should report to your doctor or health care professional as soon as possible: -allergic reactions like skin rash, itching or hives, swelling of the face, lips, or tongue -breathing problems -chest pain -depression or memory disorders -pain in your legs or groin -pain at site where injected or implanted -severe headache -swelling of the feet and legs -visual changes -vomiting Side effects that usually do not require medical attention (report to your doctor or health care professional if they continue or are bothersome): -breast swelling or tenderness -decrease in sex drive or performance -diarrhea -hot flashes -loss of appetite -muscle, joint, or bone pains -nausea -redness or  irritation at site where injected or implanted -skin problems or acne This list may not describe all possible side effects. Call your doctor for medical advice about side effects. You may report side effects to FDA at 1-800-FDA-1088. Where should I keep my medicine? This drug is given in a hospital or clinic and will not be stored at home. NOTE: This sheet is a summary. It may not cover all possible information. If you have questions about this medicine, talk to your doctor, pharmacist, or health care provider.  2015, Elsevier/Gold Standard. (2009-10-15 14:41:21)

## 2014-08-14 NOTE — Assessment & Plan Note (Signed)
She has high bilirubin level. I recommend close monitoring. She is not symptomatic. Her recent hepatitis screen was negative. I will continue dose adjustment of the chemotherapeutic agents by 50%.

## 2014-08-14 NOTE — Telephone Encounter (Signed)
Appointments made and avs will be printed in chemo  °

## 2014-08-14 NOTE — Assessment & Plan Note (Signed)
This is related to low albumin, not related to congestive heart failure. I do not recommend diuretic therapy. This has resolved since cycle 1 of treatment.

## 2014-08-28 ENCOUNTER — Ambulatory Visit: Payer: Self-pay

## 2014-08-28 ENCOUNTER — Other Ambulatory Visit: Payer: Self-pay

## 2014-09-06 ENCOUNTER — Encounter: Payer: Self-pay | Admitting: Internal Medicine

## 2014-09-06 ENCOUNTER — Ambulatory Visit (INDEPENDENT_AMBULATORY_CARE_PROVIDER_SITE_OTHER): Payer: Self-pay | Admitting: Internal Medicine

## 2014-09-06 VITALS — BP 122/83 | HR 88 | Temp 99.7°F | Wt 113.0 lb

## 2014-09-06 DIAGNOSIS — B20 Human immunodeficiency virus [HIV] disease: Secondary | ICD-10-CM

## 2014-09-06 DIAGNOSIS — C8190 Hodgkin lymphoma, unspecified, unspecified site: Secondary | ICD-10-CM

## 2014-09-06 DIAGNOSIS — Z21 Asymptomatic human immunodeficiency virus [HIV] infection status: Secondary | ICD-10-CM

## 2014-09-06 NOTE — Progress Notes (Signed)
   Subjective:    Patient ID: [name], female    DOB: [birth year], 29 y.o.   MRN: [id number]  HPI She comes in for follow-up of HIV. I last saw her in February of this year when she was worked in after hospitalization and biopsy results showed Hodgkin's lymphoma. She now has been followed by oncology and is continuing on chemotherapy. She has 2 cycles left. She says she is tolerating it well and actually feels like she has more energy than before and her appetite has been much better. This is an improvement from prior to her cancer diagnosis. No problems with nausea or vomiting and no problems taking her HIV medicine daily.   Review of Systems  Constitutional: Negative for fever, chills, appetite change and fatigue.  Gastrointestinal: Negative for nausea and diarrhea.  Musculoskeletal:       Some bilateral foot pain  Skin: Negative for rash.  Neurological: Negative for dizziness and light-headedness.       Objective:   Physical Exam  Constitutional: No distress.  HENT:  Mouth/Throat: No oropharyngeal exudate.  Eyes: No scleral icterus.  Cardiovascular: Normal rate, regular rhythm and normal heart sounds.   No murmur heard. Pulmonary/Chest: Effort normal and breath sounds normal. No respiratory distress. She has no wheezes.  Abdominal:  Some increased abdominal girth  Lymphadenopathy:    She has no cervical adenopathy.  Skin: No rash noted.  Psychiatric: She has a normal mood and affect.          Assessment & Plan:

## 2014-09-06 NOTE — Assessment & Plan Note (Signed)
Seems to be tolerating well.  Appreciate Dr. Alvy Bimler management and care.

## 2014-09-06 NOTE — Assessment & Plan Note (Signed)
She is doing well and labs today, rtc 3 months.

## 2014-09-07 LAB — T-HELPER CELL (CD4) - (RCID CLINIC ONLY)
CD4 % Helper T Cell: 31 % — ABNORMAL LOW (ref 33–55)
CD4 T Cell Abs: 520 /uL (ref 400–2700)

## 2014-09-08 LAB — HIV-1 RNA QUANT-NO REFLEX-BLD
HIV 1 RNA Quant: 178 copies/mL — ABNORMAL HIGH (ref <20)
HIV-1 RNA Quant, Log: 2.25 {Log} — ABNORMAL HIGH (ref <1.30)

## 2014-09-11 ENCOUNTER — Ambulatory Visit (HOSPITAL_BASED_OUTPATIENT_CLINIC_OR_DEPARTMENT_OTHER): Payer: Self-pay

## 2014-09-11 ENCOUNTER — Other Ambulatory Visit (HOSPITAL_BASED_OUTPATIENT_CLINIC_OR_DEPARTMENT_OTHER): Payer: Self-pay

## 2014-09-11 ENCOUNTER — Telehealth: Payer: Self-pay | Admitting: *Deleted

## 2014-09-11 ENCOUNTER — Telehealth: Payer: Self-pay | Admitting: Hematology and Oncology

## 2014-09-11 ENCOUNTER — Other Ambulatory Visit (HOSPITAL_COMMUNITY)
Admission: RE | Admit: 2014-09-11 | Discharge: 2014-09-11 | Disposition: A | Discharge status: Home / Self Care | Payer: Medicaid Other | Source: Ambulatory Visit | Attending: Hematology and Oncology | Admitting: Hematology and Oncology

## 2014-09-11 ENCOUNTER — Other Ambulatory Visit: Payer: Self-pay

## 2014-09-11 ENCOUNTER — Encounter: Payer: Self-pay | Admitting: Hematology and Oncology

## 2014-09-11 ENCOUNTER — Ambulatory Visit (HOSPITAL_BASED_OUTPATIENT_CLINIC_OR_DEPARTMENT_OTHER): Payer: Self-pay | Admitting: Hematology and Oncology

## 2014-09-11 ENCOUNTER — Other Ambulatory Visit: Payer: Self-pay | Admitting: Hematology and Oncology

## 2014-09-11 VITALS — BP 113/69 | HR 72 | Temp 98.4°F | Resp 16 | Wt 116.6 lb

## 2014-09-11 VITALS — Wt 116.0 lb

## 2014-09-11 DIAGNOSIS — I959 Hypotension, unspecified: Secondary | ICD-10-CM | POA: Insufficient documentation

## 2014-09-11 DIAGNOSIS — B20 Human immunodeficiency virus [HIV] disease: Secondary | ICD-10-CM

## 2014-09-11 DIAGNOSIS — Z5111 Encounter for antineoplastic chemotherapy: Secondary | ICD-10-CM

## 2014-09-11 DIAGNOSIS — C8190 Hodgkin lymphoma, unspecified, unspecified site: Secondary | ICD-10-CM

## 2014-09-11 DIAGNOSIS — D638 Anemia in other chronic diseases classified elsewhere: Secondary | ICD-10-CM

## 2014-09-11 DIAGNOSIS — R945 Abnormal results of liver function studies: Secondary | ICD-10-CM

## 2014-09-11 DIAGNOSIS — R7989 Other specified abnormal findings of blood chemistry: Secondary | ICD-10-CM

## 2014-09-11 LAB — PREGNANCY, URINE: Preg Test, Ur: NEGATIVE

## 2014-09-11 LAB — COMPREHENSIVE METABOLIC PANEL (CC13)
ALBUMIN: 3.7 g/dL (ref 3.5–5.0)
ALT: 27 U/L (ref 0–55)
AST: 23 U/L (ref 5–34)
Alkaline Phosphatase: 105 U/L (ref 40–150)
Anion Gap: 12 mEq/L — ABNORMAL HIGH (ref 3–11)
BUN: 8.7 mg/dL (ref 7.0–26.0)
CHLORIDE: 104 meq/L (ref 98–109)
CO2: 27 mEq/L (ref 22–29)
Calcium: 9.6 mg/dL (ref 8.4–10.4)
Creatinine: 0.7 mg/dL (ref 0.6–1.1)
EGFR: >90 mL/min/{1.73_m2} (ref >90)
GLUCOSE: 79 mg/dL (ref 70–140)
Potassium: 3.5 mEq/L (ref 3.5–5.1)
SODIUM: 144 meq/L (ref 136–145)
Total Bilirubin: 1.49 mg/dL — ABNORMAL HIGH (ref 0.20–1.20)
Total Protein: 7.7 g/dL (ref 6.4–8.3)

## 2014-09-11 LAB — CBC WITH DIFFERENTIAL/PLATELET
BASO%: 0.3 % (ref 0.0–2.0)
Basophils Absolute: 0 10*3/uL (ref 0.0–0.1)
EOS ABS: 0.2 10*3/uL (ref 0.0–0.5)
EOS%: 3.4 % (ref 0.0–7.0)
HCT: 40.5 % (ref 34.8–46.6)
HEMOGLOBIN: 13.7 g/dL (ref 11.6–15.9)
LYMPH#: 1.7 10*3/uL (ref 0.9–3.3)
LYMPH%: 28.5 % (ref 14.0–49.7)
MCH: 32.5 pg (ref 25.1–34.0)
MCHC: 33.8 g/dL (ref 31.5–36.0)
MCV: 96 fL (ref 79.5–101.0)
MONO#: 0.4 10*3/uL (ref 0.1–0.9)
MONO%: 7.3 % (ref 0.0–14.0)
NEUT%: 60.5 % (ref 38.4–76.8)
NEUTROS ABS: 3.5 10*3/uL (ref 1.5–6.5)
Platelets: 262 10*3/uL (ref 145–400)
RBC: 4.22 10*6/uL (ref 3.70–5.45)
RDW: 16.8 % — AB (ref 11.2–14.5)
WBC: 5.9 10*3/uL (ref 3.9–10.3)

## 2014-09-11 MED ORDER — SODIUM CHLORIDE 0.9 % IV SOLN
Freq: Once | INTRAVENOUS | Status: AC
  Administered 2014-09-11: 11:00:00 via INTRAVENOUS
  Filled 2014-09-11: qty 8

## 2014-09-11 MED ORDER — VINBLASTINE SULFATE CHEMO INJECTION 1 MG/ML
5.8000 mg/m2 | Freq: Once | INTRAVENOUS | Status: AC
  Administered 2014-09-11: 9 mg via INTRAVENOUS
  Filled 2014-09-11: qty 9

## 2014-09-11 MED ORDER — SODIUM CHLORIDE 0.9 % IJ SOLN
10.0000 mL | INTRAMUSCULAR | Status: DC | PRN
  Administered 2014-09-11: 10 mL
  Filled 2014-09-11: qty 10

## 2014-09-11 MED ORDER — HEPARIN SOD (PORK) LOCK FLUSH 100 UNIT/ML IV SOLN
500.0000 [IU] | Freq: Once | INTRAVENOUS | Status: AC | PRN
  Administered 2014-09-11: 500 [IU]
  Filled 2014-09-11: qty 5

## 2014-09-11 MED ORDER — SODIUM CHLORIDE 0.9 % IV SOLN
10.0000 [IU]/m2 | Freq: Once | INTRAVENOUS | Status: AC
  Administered 2014-09-11: 16 [IU] via INTRAVENOUS
  Filled 2014-09-11: qty 5.33

## 2014-09-11 MED ORDER — SODIUM CHLORIDE 0.9 % IV SOLN
Freq: Once | INTRAVENOUS | Status: AC
  Administered 2014-09-11: 11:00:00 via INTRAVENOUS

## 2014-09-11 MED ORDER — DOXORUBICIN HCL CHEMO IV INJECTION 2 MG/ML: 25.0000 mg/m2 | Freq: Once | INTRAVENOUS | Status: DC

## 2014-09-11 MED ORDER — SODIUM CHLORIDE 0.9 % IV SOLN
375.0000 mg/m2 | Freq: Once | INTRAVENOUS | Status: AC
  Administered 2014-09-11: 580 mg via INTRAVENOUS
  Filled 2014-09-11: qty 29

## 2014-09-11 MED ORDER — LEUPROLIDE ACETATE 7.5 MG IM KIT
7.5000 mg | PACK | INTRAMUSCULAR | Status: DC
  Administered 2014-09-11: 7.5 mg via INTRAMUSCULAR
  Filled 2014-09-11: qty 7.5

## 2014-09-11 MED ORDER — DOXORUBICIN HCL CHEMO IV INJECTION 2 MG/ML
12.5000 mg/m2 | Freq: Once | INTRAVENOUS | Status: AC
  Administered 2014-09-11: 20 mg via INTRAVENOUS
  Filled 2014-09-11: qty 10

## 2014-09-11 NOTE — Telephone Encounter (Signed)
Per staff message and POF I have scheduled appts. Advised scheduler of appts and no available on 5/31, moved to 6/1 JMW

## 2014-09-11 NOTE — Telephone Encounter (Signed)
Pt confirmed labs/ov per 05/17 POF, gave pt AVS and Calendar.... KJ, sent msg to add chemo °

## 2014-09-11 NOTE — Assessment & Plan Note (Signed)
She follows closely with infectious disease clinic. She is compliant taking all her medications.

## 2014-09-11 NOTE — Assessment & Plan Note (Signed)
Her function tests has thankfully recovered fully. I will increase the dose of the chemotherapy to full dose.

## 2014-09-11 NOTE — Assessment & Plan Note (Addendum)
The patient tolerated cycle one the treatment very well. She had perceived shrinkage of lymphadenopathy. Her leg swelling has improved. Anemia is resolving. Overall, she appears to be responding to treatment. I will restage her after she completed 2 cycles of therapy. Due to her young age, I will continue Lupron injection every 4 weeks to suppress her reproductive function. Due to her noncompliance, her treatment was delayed. I will order a PET CT scan to be done next month after 2 cycles of treatment. With recovery of her liver function tests, I will increase the dose of the chemotherapy to give her the best chance of success of beating the cancer.

## 2014-09-11 NOTE — Progress Notes (Signed)
[place] OFFICE PROGRESS NOTE  Patient Care Team: Thayer Headings, MD as PCP - General (Internal Medicine) Thayer Headings, MD as PCP - Infectious Diseases (Infectious Diseases) Woodroe Mode, MD as Consulting Physician (Obstetrics and Gynecology) Heath Lark, MD as Consulting Physician (Hematology and Oncology)  SUMMARY OF ONCOLOGIC HISTORY:   Hodgkin's lymphoma   05/06/2014 Imaging CT scan of the abdomen show diffuse mesenteric lymphadenopathy.   05/07/2014 Imaging CT scan of the chest show right thoracic inlet lymphadenopathy   06/07/2014 Procedure She underwent ultrasound-guided core biopsy of the neck lymph node   06/07/2014 Pathology Results Accession: [id number] biopsy confirmed diagnosis of Hodgkin lymphoma.   06/15/2014 Imaging Echocardiogram showed preserved ejection fraction   07/09/2014 - 07/12/2014 Hospital Admission She was admitted to the hospital for severe anemia.   07/27/2014 Procedure She had placement of port   07/31/2014 -  Chemotherapy She received dose adjusted chemotherapy due to abnormal liver function tests and severe anemia. Treatment was delayed due to noncompliance    INTERVAL HISTORY: Please see below for problem oriented charting. She returns for further follow-up. She missed her chemotherapy 2 weeks ago. She feels well otherwise. Tolerated treatment well. Denies mucositis, nausea, leg edema. SHe complained of distended abdomen but denies pain.  REVIEW OF SYSTEMS:   Constitutional: Denies fevers, chills or abnormal weight loss Eyes: Denies blurriness of vision Ears, nose, mouth, throat, and face: Denies mucositis or sore throat Respiratory: Denies cough, dyspnea or wheezes Cardiovascular: Denies palpitation, chest discomfort or lower extremity swelling Gastrointestinal:  Denies nausea, heartburn or change in bowel habits Skin: Denies abnormal skin rashes Lymphatics: Denies new lymphadenopathy or easy bruising Neurological:Denies numbness,  tingling or new weaknesses Behavioral/Psych: Mood is stable, no new changes  All other systems were reviewed with the patient and are negative.  I have reviewed the past medical history, past surgical history, social history and family history with the patient and they are unchanged from previous note.  ALLERGIES:  has No Known Allergies.  MEDICATIONS:  Current Outpatient Prescriptions  Medication Sig Dispense Refill  . acetaminophen (TYLENOL) 325 MG tablet Take 2 tablets (650 mg total) by mouth every 4 (four) hours as needed for mild pain or fever. 30 tablet 0  . atazanavir (REYATAZ) 300 MG capsule Take 1 capsule (300 mg total) by mouth daily with breakfast. TAKE WITH NORVIR 30 capsule 5  . buPROPion (WELLBUTRIN XL) 150 MG 24 hr tablet Take 150 mg by mouth at bedtime.     Marland Kitchen emtricitabine-tenofovir (TRUVADA) 200-300 MG per tablet Take 1 tablet by mouth daily. 30 tablet 5  . hydrocortisone cream 1 % Apply 1 application topically daily.    Marland Kitchen lidocaine-prilocaine (EMLA) cream Apply to affected area once 30 g 3  . Naphazoline HCl (CLEAR EYES OP) Apply 2 drops to eye 2 (two) times daily.    . ondansetron (ZOFRAN) 8 MG tablet Take 1 tablet (8 mg total) by mouth 2 (two) times daily. Start the day after chemo for 3 days. Then take as needed for nausea or vomiting. 30 tablet 1  . potassium chloride SA (K-DUR,KLOR-CON) 20 MEQ tablet Take 1 tablet (20 mEq total) by mouth 2 (two) times daily. 60 tablet 0  . ritonavir (NORVIR) 100 MG TABS tablet Take 1 tablet (100 mg total) by mouth daily with breakfast. TAKE WITH REYATAZ 30 tablet 5  . traZODone (DESYREL) 50 MG tablet Take 50 mg by mouth at bedtime.     No current facility-administered medications for this  visit.    PHYSICAL EXAMINATION: ECOG PERFORMANCE STATUS: 0 - Asymptomatic  Filed Vitals:   09/11/14 0939  BP: 113/69  Pulse: 72  Temp: 98.4 F (36.9 C)  Resp: 16   Filed Weights   09/11/14 0939  Weight: 116 lb 9.6 oz (52.889 kg)     GENERAL:alert, no distress and comfortable SKIN: skin color, texture, turgor are normal, no rashes or significant lesions EYES: normal, Conjunctiva are pink and non-injected, sclera clear OROPHARYNX:no exudate, no erythema and lips, buccal mucosa, and tongue normal  NECK: supple, thyroid normal size, non-tender, without nodularity LYMPH:  no palpable lymphadenopathy in the cervical, axillary or inguinal LUNGS: clear to auscultation and percussion with normal breathing effort HEART: regular rate & rhythm and no murmurs and no lower extremity edema ABDOMEN:abdomen soft, non-tender and normal bowel sounds Musculoskeletal:no cyanosis of digits and no clubbing  NEURO: alert & oriented x 3 with fluent speech, no focal motor/sensory deficits  LABORATORY DATA:  I have reviewed the data as listed    Component Value Date/Time   NA 144 09/11/2014 0857   NA 140 07/27/2014 0835   K 3.5 09/11/2014 0857   K 3.9 07/27/2014 0835   CL 106 07/27/2014 0835   CO2 27 09/11/2014 0857   CO2 28 07/27/2014 0835   GLUCOSE 79 09/11/2014 0857   GLUCOSE 78 07/27/2014 0835   BUN 8.7 09/11/2014 0857   BUN 7 07/27/2014 0835   CREATININE 0.7 09/11/2014 0857   CREATININE 0.44* 07/27/2014 0835   CREATININE 0.87 01/25/2014 1626   CALCIUM 9.6 09/11/2014 0857   CALCIUM 8.5 07/27/2014 0835   PROT 7.7 09/11/2014 0857   PROT 5.4* 07/09/2014 0933   ALBUMIN 3.7 09/11/2014 0857   ALBUMIN 1.5* 07/09/2014 0933   AST 23 09/11/2014 0857   AST 29 07/09/2014 0933   ALT 27 09/11/2014 0857   ALT 33 07/09/2014 0933   ALKPHOS 105 09/11/2014 0857   ALKPHOS 121* 07/09/2014 0933   BILITOT 1.49* 09/11/2014 0857   BILITOT 1.8* 07/09/2014 0933   GFRNONAA >90 07/27/2014 0835   GFRNONAA >89 01/25/2014 1626   GFRAA >90 07/27/2014 0835   GFRAA >89 01/25/2014 1626    No results found for: SPEP, UPEP  Lab Results  Component Value Date   WBC 5.9 09/11/2014   NEUTROABS 3.5 09/11/2014   HGB 13.7 09/11/2014   HCT 40.5  09/11/2014   MCV 96.0 09/11/2014   PLT 262 09/11/2014      Chemistry      Component Value Date/Time   NA 144 09/11/2014 0857   NA 140 07/27/2014 0835   K 3.5 09/11/2014 0857   K 3.9 07/27/2014 0835   CL 106 07/27/2014 0835   CO2 27 09/11/2014 0857   CO2 28 07/27/2014 0835   BUN 8.7 09/11/2014 0857   BUN 7 07/27/2014 0835   CREATININE 0.7 09/11/2014 0857   CREATININE 0.44* 07/27/2014 0835   CREATININE 0.87 01/25/2014 1626      Component Value Date/Time   CALCIUM 9.6 09/11/2014 0857   CALCIUM 8.5 07/27/2014 0835   ALKPHOS 105 09/11/2014 0857   ALKPHOS 121* 07/09/2014 0933   AST 23 09/11/2014 0857   AST 29 07/09/2014 0933   ALT 27 09/11/2014 0857   ALT 33 07/09/2014 0933   BILITOT 1.49* 09/11/2014 0857   BILITOT 1.8* 07/09/2014 0933      ASSESSMENT & PLAN:  Hodgkin's lymphoma The patient tolerated cycle one the treatment very well. She had perceived shrinkage of lymphadenopathy. Her leg  swelling has improved. Anemia is resolving. Overall, she appears to be responding to treatment. I will restage her after she completed 2 cycles of therapy. Due to her young age, I will continue Lupron injection every 4 weeks to suppress her reproductive function. Due to her noncompliance, her treatment was delayed. I will order a PET CT scan to be done next month after 2 cycles of treatment. With recovery of her liver function tests, I will increase the dose of the chemotherapy to give her the best chance of success of beating the cancer.   Human immunodeficiency virus (HIV) disease She follows closely with infectious disease clinic. She is compliant taking all her medications.    Abnormal liver function tests Her function tests has thankfully recovered fully. I will increase the dose of the chemotherapy to full dose.     Orders Placed This Encounter  Procedures  . NM PET Image Restag (PS) Skull Base To Thigh    Standing Status: Future     Number of Occurrences:       Standing Expiration Date: 11/11/2015    Order Specific Question:  Reason for Exam (SYMPTOM  OR DIAGNOSIS REQUIRED)    Answer:  staging hodgkin lymphoma assess response to Rx    Order Specific Question:  Is the patient pregnant?    Answer:  No    Order Specific Question:  Preferred imaging location?    Answer:  [facility]   All questions were answered. The patient knows to call the clinic with any problems, questions or concerns. No barriers to learning was detected. I spent 25 minutes counseling the patient face to face. The total time spent in the appointment was 30 minutes and more than 50% was on counseling and review of test results     [facility], [place], MD 09/11/2014 9:54 AM

## 2014-09-12 ENCOUNTER — Other Ambulatory Visit: Payer: Self-pay | Admitting: *Deleted

## 2014-09-12 DIAGNOSIS — B20 Human immunodeficiency virus [HIV] disease: Secondary | ICD-10-CM

## 2014-09-12 MED ORDER — EMTRICITABINE-TENOFOVIR DF 200-300 MG PO TABS: 1.0000 | ORAL_TABLET | Freq: Every day | ORAL | Status: DC

## 2014-09-12 MED ORDER — RITONAVIR 100 MG PO TABS: 100.0000 mg | ORAL_TABLET | Freq: Every day | ORAL | Status: DC

## 2014-09-12 MED ORDER — ATAZANAVIR SULFATE 300 MG PO CAPS: 300.0000 mg | ORAL_CAPSULE | Freq: Every day | ORAL | Status: DC

## 2014-09-12 NOTE — Telephone Encounter (Signed)
ADAP Application 

## 2014-09-19 ENCOUNTER — Telehealth: Payer: Self-pay | Admitting: Hematology and Oncology

## 2014-09-19 ENCOUNTER — Other Ambulatory Visit (HOSPITAL_BASED_OUTPATIENT_CLINIC_OR_DEPARTMENT_OTHER): Payer: Medicaid Other

## 2014-09-19 ENCOUNTER — Ambulatory Visit (HOSPITAL_BASED_OUTPATIENT_CLINIC_OR_DEPARTMENT_OTHER): Payer: Medicaid Other | Admitting: Nurse Practitioner

## 2014-09-19 ENCOUNTER — Telehealth: Payer: Self-pay

## 2014-09-19 ENCOUNTER — Encounter: Payer: Self-pay | Admitting: Nurse Practitioner

## 2014-09-19 VITALS — BP 116/83 | HR 78 | Temp 98.4°F | Resp 16 | Wt 111.7 lb

## 2014-09-19 DIAGNOSIS — C8190 Hodgkin lymphoma, unspecified, unspecified site: Secondary | ICD-10-CM | POA: Diagnosis not present

## 2014-09-19 DIAGNOSIS — B20 Human immunodeficiency virus [HIV] disease: Secondary | ICD-10-CM

## 2014-09-19 DIAGNOSIS — R630 Anorexia: Secondary | ICD-10-CM | POA: Insufficient documentation

## 2014-09-19 DIAGNOSIS — G893 Neoplasm related pain (acute) (chronic): Secondary | ICD-10-CM

## 2014-09-19 LAB — CBC WITH DIFFERENTIAL/PLATELET
BASO%: 0.9 % (ref 0.0–2.0)
Basophils Absolute: 0 10*3/uL (ref 0.0–0.1)
EOS%: 4 % (ref 0.0–7.0)
Eosinophils Absolute: 0.1 10*3/uL (ref 0.0–0.5)
HEMATOCRIT: 39.1 % (ref 34.8–46.6)
HGB: 13.6 g/dL (ref 11.6–15.9)
LYMPH%: 30.8 % (ref 14.0–49.7)
MCH: 32.6 pg (ref 25.1–34.0)
MCHC: 34.8 g/dL (ref 31.5–36.0)
MCV: 93.7 fL (ref 79.5–101.0)
MONO#: 0 10*3/uL — ABNORMAL LOW (ref 0.1–0.9)
MONO%: 1.3 % (ref 0.0–14.0)
NEUT#: 2.3 10*3/uL (ref 1.5–6.5)
NEUT%: 63 % (ref 38.4–76.8)
Platelets: 289 10*3/uL (ref 145–400)
RBC: 4.17 10*6/uL (ref 3.70–5.45)
RDW: 17 % — ABNORMAL HIGH (ref 11.2–14.5)
WBC: 3.6 10*3/uL — AB (ref 3.9–10.3)
lymph#: 1.1 10*3/uL (ref 0.9–3.3)

## 2014-09-19 LAB — COMPREHENSIVE METABOLIC PANEL (CC13)
ALK PHOS: 85 U/L (ref 40–150)
ALT: 24 U/L (ref 0–55)
ANION GAP: 15 meq/L — AB (ref 3–11)
AST: 18 U/L (ref 5–34)
Albumin: 4 g/dL (ref 3.5–5.0)
BUN: 11.4 mg/dL (ref 7.0–26.0)
CO2: 22 mEq/L (ref 22–29)
Calcium: 9.4 mg/dL (ref 8.4–10.4)
Chloride: 105 mEq/L (ref 98–109)
Creatinine: 0.6 mg/dL (ref 0.6–1.1)
EGFR: >90 mL/min/{1.73_m2} (ref >90)
Glucose: 80 mg/dl (ref 70–140)
Potassium: 3.6 mEq/L (ref 3.5–5.1)
Sodium: 142 mEq/L (ref 136–145)
TOTAL PROTEIN: 7.5 g/dL (ref 6.4–8.3)
Total Bilirubin: 0.87 mg/dL (ref 0.20–1.20)

## 2014-09-19 MED ORDER — HYDROCODONE-ACETAMINOPHEN 5-325 MG PO TABS: 1.0000 | ORAL_TABLET | Freq: Four times a day (QID) | ORAL | Status: DC | PRN

## 2014-09-19 NOTE — Assessment & Plan Note (Signed)
Patient is complaining of a three-day history of upper/central abdominal discomfort.  She denies any nausea, vomiting, diarrhea, or constipation.  She also denies any dysuria or other UTI symptoms.  She denies any flank pain.  She is complaining of minimal appetite.  She denies any recent fevers or chills.  On exam.-Abdomen soft and essentially nontender with palpation.  There is no rebound tenderness and no flank pain.  Labs obtained today were essentially normal.  Reviewed most recent PET scan obtained in March 2016; which confirmed tumor burden to the spleen; as well as some left kidney involvement.  Advised patient that new onset abdominal discomfort could very well be related to her disease.  Patient states that she has tried ibuprofen recently; but no other pain medications.  Will prescribe hydrocodone for the patient to try for pain management.  Advised patient that the hydrocodone may make her mildly sleepy; and that she should not take the hydrocodone is she does any recreational drugs, drinks any alcohol, or plans to drive.  Advised patient to call/return if her directly to the emergency department for any worsening symptoms whatsoever.
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
## 2014-09-19 NOTE — Assessment & Plan Note (Addendum)
Patient received her last cycle of ABVD on 09/11/2014.  Patient received her last Lupron injection on 09/11/2014 as well.  Patient is scheduled for labs only.  On 09/25/2014.  Patient is scheduled for her next chemotherapy on 09/26/2014.  Patient is scheduled for restaging PET scan on 10/08/2014.  Also, patient will continue to receive Lupron injections on an every four-week basis to continue to suppress her reproductive function.

## 2014-09-19 NOTE — Assessment & Plan Note (Signed)
Patient has a previous diagnosis of HIV.  Patient is followed closely by infectious disease.  Patient states that she has had minimal appetite since she developed the abdominal pain 3 days ago.  She states that she has been holding her HIV medication since then; since she is supposed to take the HIV medication with food.  Advised patient to resume taking her HIV medications as previously directed.

## 2014-09-19 NOTE — Telephone Encounter (Signed)
Pt called stating her abd was hurting since Sunday. A sharp pain. It is not worse or better with eating. She has not been eating much, no apetite. She states she has hard BM lbm 5/24.  She denies nausea, no vomiting, no fever. She used advil yesterday with no help. She has been drinking hot tea. S/w Cyndee and set pt up for [facility]

## 2014-09-19 NOTE — Assessment & Plan Note (Signed)
Patient states she's been experiencing some increased abdominal discomfort for the past 3 days; and has had minimal appetite since that time as well.  Patient was encouraged to eat multiple small meals throughout the day; and also push fluids.

## 2014-09-19 NOTE — Telephone Encounter (Signed)
Added appt per pof..per orders pof pt on her way

## 2014-09-20 ENCOUNTER — Ambulatory Visit (INDEPENDENT_AMBULATORY_CARE_PROVIDER_SITE_OTHER): Payer: Medicaid Other | Admitting: Internal Medicine

## 2014-09-20 ENCOUNTER — Encounter: Payer: Self-pay | Admitting: Internal Medicine

## 2014-09-20 VITALS — BP 124/83 | HR 80 | Temp 98.6°F | Wt 111.0 lb

## 2014-09-20 DIAGNOSIS — Z21 Asymptomatic human immunodeficiency virus [HIV] infection status: Secondary | ICD-10-CM

## 2014-09-20 DIAGNOSIS — B20 Human immunodeficiency virus [HIV] disease: Secondary | ICD-10-CM

## 2014-09-20 NOTE — Progress Notes (Signed)
   Subjective:    Patient ID: [name], female    DOB: [birth year], 29 y.o.   MRN: [id number]  HPI Here for a work in visit.  Seeing oncology for lymphoma and I saw her earlier this month for HIV.  Missed 3 days of her ARVs due to abdominal pain.  Now back on and tolerating well.  No n/v.  No weight loss.  Has some loose stools but not constipated.  Received some pain medication from oncology.    Review of Systems  Constitutional: Negative for fatigue.  Respiratory: Negative for shortness of breath.   Gastrointestinal: Positive for diarrhea. Negative for nausea.  Skin: Negative for rash.  Neurological: Negative for dizziness and light-headedness.       Objective:   Physical Exam  Constitutional: She appears well-developed and well-nourished. No distress.  Cardiovascular: Normal rate, regular rhythm and normal heart sounds.   No murmur heard. Abdominal: Soft. Bowel sounds are normal. She exhibits no distension. There is no tenderness. There is no rebound and no guarding.  Lymphadenopathy:    She has no cervical adenopathy.  Skin: No rash noted.          Assessment & Plan:

## 2014-09-20 NOTE — Assessment & Plan Note (Signed)
Off her medicine a few days so will recheck viral load in about 2 weeks with her oncology labs.  If ok, rtc in August.

## 2014-09-21 ENCOUNTER — Telehealth: Payer: Self-pay | Admitting: *Deleted

## 2014-09-21 ENCOUNTER — Other Ambulatory Visit: Payer: Self-pay | Admitting: *Deleted

## 2014-09-21 MED ORDER — HYDROMORPHONE HCL 2 MG PO TABS: 2.0000 mg | ORAL_TABLET | Freq: Four times a day (QID) | ORAL | Status: DC | PRN

## 2014-09-21 NOTE — Telephone Encounter (Signed)
Patient called reporting she has not received a return call.  Observed note about mid epigastric pain.  Dr. Alvy Bimler nurse reports [facility] nurse is to speak with on-call provider.  Asked Clarise Cruz to call patient as soon as she receives response and patient notified.   "I was there Wednesday and given hydrocodone (Norco 5-325).  I have 11 pills left.  I take five pills a day.  I eat and nothing helps.  I have continuous, stabbing,  ache right in the middle of my stomach."  Denies back or any radiating pain, "just my abdomen".  Reports she takes one and a half pills at a time.

## 2014-09-21 NOTE — Telephone Encounter (Signed)
TC to pt- difficult to hear pt due to background noise. Pt has been taking new pain medication hydrocodone and has not had positive results. No issues with nausea or constipation. Pt would like to try something different. Pt has recently followed up with ID and restarted her antiviral medications. Advised pt this message would be forwarded to Dr. Cathie Beams for review.

## 2014-09-21 NOTE — Telephone Encounter (Signed)
RN spoke with Dr Alen Blew. Prescribed Dilaudid. Pt to pick up rx this afternoon

## 2014-09-24 ENCOUNTER — Other Ambulatory Visit: Payer: Self-pay | Admitting: Hematology and Oncology

## 2014-09-24 DIAGNOSIS — B20 Human immunodeficiency virus [HIV] disease: Secondary | ICD-10-CM

## 2014-09-25 ENCOUNTER — Other Ambulatory Visit: Payer: Self-pay

## 2014-09-25 NOTE — Telephone Encounter (Signed)
Dr. Alen Blew ordered dilaudid for patient per EMR.

## 2014-09-25 NOTE — Telephone Encounter (Signed)
Don't worry, will take care of it

## 2014-09-25 NOTE — Telephone Encounter (Signed)
Ni, Did you get this note? Thanks [employer]

## 2014-09-26 ENCOUNTER — Ambulatory Visit: Payer: Self-pay

## 2014-10-08 ENCOUNTER — Ambulatory Visit (HOSPITAL_COMMUNITY): Admission: RE | Admit: 2014-10-08 | Payer: Medicaid Other | Source: Ambulatory Visit

## 2014-10-09 ENCOUNTER — Ambulatory Visit: Payer: Self-pay | Admitting: Hematology and Oncology

## 2014-10-09 ENCOUNTER — Other Ambulatory Visit: Payer: Self-pay

## 2014-10-09 ENCOUNTER — Ambulatory Visit: Payer: Self-pay

## 2014-10-12 ENCOUNTER — Encounter: Payer: Self-pay | Admitting: *Deleted

## 2014-10-12 ENCOUNTER — Telehealth: Payer: Self-pay | Admitting: Hematology and Oncology

## 2014-10-12 ENCOUNTER — Encounter: Payer: Self-pay | Admitting: Hematology and Oncology

## 2014-10-12 ENCOUNTER — Telehealth: Payer: Self-pay | Admitting: *Deleted

## 2014-10-12 ENCOUNTER — Other Ambulatory Visit: Payer: Self-pay | Admitting: Internal Medicine

## 2014-10-12 DIAGNOSIS — C8590 Non-Hodgkin lymphoma, unspecified, unspecified site: Secondary | ICD-10-CM

## 2014-10-12 NOTE — Telephone Encounter (Signed)
-----   Message from Thayer Headings, MD sent at 10/12/2014  9:38 AM EDT ----- Regarding: FW: no shows and medical non-compliance She was discharged from oncology due to non compliance and no shows and still was supposed to get chemotherapy???   Can you find out why and see what help we can get her Delories Heinz?).    ----- Message -----    From: Heath Lark, MD    Sent: 10/12/2014   8:01 AM      To: Thayer Headings, MD Subject: no shows and medical non-compliance            FYI patient will be discharged from the oncology clinic due to no-shows and medical non-compliance

## 2014-10-12 NOTE — Telephone Encounter (Signed)
Spoke to patient and she was not aware of being discharged from oncology. She is willing to see [name] and referral made. [name]

## 2014-10-12 NOTE — Progress Notes (Signed)
Letter signed by Dr. Alvy Bimler today to d/c pt from our clinic.   See letter dated today.   Letter given to Skip Hislop in Administration to arrange for letter to be sent to pt by Certified Mail.

## 2014-10-12 NOTE — Telephone Encounter (Signed)
Per MD ct pet ....done

## 2014-10-15 ENCOUNTER — Ambulatory Visit (HOSPITAL_COMMUNITY): Payer: Medicaid Other

## 2014-10-16 ENCOUNTER — Telehealth: Payer: Self-pay | Admitting: *Deleted

## 2014-10-16 NOTE — Telephone Encounter (Signed)
Referral received for [place]. RN reviewed the patient's information before giving her a call. Contacted the patient and offered time to sit down and discuss her discharge from Oncology and her health. The patient agreed to a home visit on this coming Thursday(06/23)

## 2014-10-17 ENCOUNTER — Encounter: Payer: Self-pay | Admitting: *Deleted

## 2014-10-17 NOTE — Progress Notes (Signed)
Patient ID: [name], female   DOB: 05/12/1985, 29 y.o.   MRN: [id number] RN spoke with the patient yesterday and offered my services of coming out as her Community Based Nurse to assist her in managing her medications and health. RN asked the patient if she was aware that the cancer center has discharged her as a patient and if she understood the reason for the discharge. The patient stated she is aware that Dr Alvy Bimler is discharging her because she has missed too many appts. Patient stated she is willing to have a visit with me to further discuss her needs.  Prior to our scheduled visit for tomorrow, RN contacted the [place] to assess what the patient can do if she commits to keeping her appts. RN spoke with Tye Maryland at the [facility] who transferred the call to [name](patient coordinator). RN expressed to [name] that I completely understand why the patient was discharged but wonder if we can assist her still if she shows commitment to keeping her appts. Explained to [name] that I plan to make a home visit with the patient tomorrow to assess her barriers and to see how I can help. At that time I will also assess her level of commitment and express to her that her CG's are here to support her and cannot continue to make appts for her if she is not committed. Her appts take from others that may be committed to getting better.   [name] agreed to speak with another provider about taking over the patient's care if she shows and expresses a level of commitment. Advised [name] that I will return my call with a update after my visit with her tomorrow to discuss how we should proceed with the patient's care. [name] agreed with this plan.

## 2014-10-18 ENCOUNTER — Telehealth: Payer: Self-pay | Admitting: *Deleted

## 2014-10-18 ENCOUNTER — Other Ambulatory Visit: Payer: Medicaid Other | Admitting: *Deleted

## 2014-10-19 ENCOUNTER — Telehealth: Payer: Self-pay | Admitting: *Deleted

## 2014-10-19 ENCOUNTER — Other Ambulatory Visit: Payer: Medicaid Other | Admitting: *Deleted

## 2014-10-19 NOTE — Telephone Encounter (Signed)
RN contacted the patient to cancel our pre-arranged visit due to a emergency and I will be unavailable today. RN instructed/updated the pt that yesterday I conferenced with the Cancer Center/[name] in hopes of getting the patient reconnected with the [place]. Instructed the patient that after she has a appt with me, and we discuss her level of commitment the [place] may agree to take her back under a new physician at the [facility]. Pt thanked me and we agreed to our initial Home visit tomorrow(06/24) at 11:30

## 2014-10-19 NOTE — Telephone Encounter (Signed)
RN received a message from the patient stating she would need to cancel today's appt. Pt stated she would be able to reschedule for Monday (06/27). Rn thanked the patient and has plans to see the patient on Monday (06/27)

## 2014-10-22 ENCOUNTER — Other Ambulatory Visit: Payer: Medicaid Other | Admitting: *Deleted

## 2014-10-22 ENCOUNTER — Telehealth: Payer: Self-pay | Admitting: *Deleted

## 2014-10-22 NOTE — Telephone Encounter (Addendum)
RN spoke with the patient on Friday(06/24) in which the patient cancelled. Patient and I agreed to reschedule visit for this AM at 9:45. RN contacted patient at 9:55 to ensure patient would be available for visit. Pt responded stating she has already left home for today. Verified with the patient that our appt was for 9:45 and apologized for contacting her 71minutes after our scheduled time. Advised the patient that I will reach out to the [place] with a update on our status and will be available to her when she ready to meet and discuss her plan. Pt responded by stating "So what do I need to do?, I need my chemo". Instructed the patient that the [place] really wants [place] to meet and discuss her plans for treating her conditions and adhering to her plan. Advised the patient that I am not here to force her to do anything but will be here for her when she is ready. Pt asked if she can call the [place], RN encouraged the patient by stating "Please call and stress to them how important your treatment is to you. Pt thanked me and RN offered encouragement by stating "not a problem and wishing you the best"

## 2014-11-12 ENCOUNTER — Telehealth: Payer: Self-pay

## 2014-11-12 NOTE — Telephone Encounter (Signed)
I will discuss with Dr. Rolanda Jay

## 2014-11-12 NOTE — Telephone Encounter (Signed)
S/w mother. She knows pt slept through her last appt. She stated that no one mentioned pt would be dismissed from the clinic for missing appts or she would have made sure she made her appts. Mother states she was supporting her daughter and daughter was living with her for some time. Daughter is back into her own apt. Pt has been depressed. Mother found out today that pt was dismissed from practice. Mother is stating she would be greatly appreciative if Dr Alvy Bimler would give her a second chance and she would make sure her daughter made her appts.

## 2014-11-12 NOTE — Telephone Encounter (Signed)
Mother calling to try to r/s appt. Noted pt was discharged from our practice.

## 2014-11-15 ENCOUNTER — Telehealth: Payer: Self-pay | Admitting: *Deleted

## 2014-11-15 NOTE — Telephone Encounter (Signed)
VM message received @11 :41 am from patient stating she wants to return to [facility].

## 2014-11-15 NOTE — Telephone Encounter (Signed)
I am discussing with another MD to see if he is willing to take the patient We are not obligated to take the patient back

## 2014-11-19 NOTE — Telephone Encounter (Signed)
She needs to be referred We can fax records to [facility] and please ask [name]/medical records to request an appointment

## 2014-11-19 NOTE — Telephone Encounter (Signed)
PT.'S MOTHER LEFT A VOICE MAIL AT 10:47AM/RETURN CALL AT 3:45PM- PT.'S MOTHER REQUESTING HER DAUGHTER'S RECORDS BE TRANSFERRED TO [facility] IN [place]. HOWEVER PT. DOES NOT HAVE A PHYSICIAN THERE.

## 2014-11-20 ENCOUNTER — Telehealth: Payer: Self-pay | Admitting: *Deleted

## 2014-11-20 NOTE — Telephone Encounter (Signed)
Heath Lark, MD at 11/19/2014 4:00 PM     Status: Signed       Expand All Collapse All   She needs to be referred We can fax records to [facility] and please ask [name]/medical records to request an appointment       Message to Dixie with above.

## 2014-12-04 ENCOUNTER — Other Ambulatory Visit (HOSPITAL_COMMUNITY)
Admission: RE | Admit: 2014-12-04 | Discharge: 2014-12-04 | Disposition: A | Discharge status: Home / Self Care | Payer: Medicaid Other | Source: Ambulatory Visit | Attending: Internal Medicine | Admitting: Internal Medicine

## 2014-12-04 ENCOUNTER — Ambulatory Visit (INDEPENDENT_AMBULATORY_CARE_PROVIDER_SITE_OTHER): Payer: Medicaid Other | Admitting: Internal Medicine

## 2014-12-04 ENCOUNTER — Encounter: Payer: Self-pay | Admitting: Internal Medicine

## 2014-12-04 VITALS — BP 103/65 | HR 68 | Temp 98.2°F | Ht 64.0 in | Wt 106.0 lb

## 2014-12-04 DIAGNOSIS — Z79899 Other long term (current) drug therapy: Secondary | ICD-10-CM

## 2014-12-04 DIAGNOSIS — Z113 Encounter for screening for infections with a predominantly sexual mode of transmission: Secondary | ICD-10-CM | POA: Insufficient documentation

## 2014-12-04 DIAGNOSIS — B20 Human immunodeficiency virus [HIV] disease: Secondary | ICD-10-CM

## 2014-12-04 DIAGNOSIS — C8190 Hodgkin lymphoma, unspecified, unspecified site: Secondary | ICD-10-CM

## 2014-12-04 LAB — CBC WITH DIFFERENTIAL/PLATELET
Basophils Absolute: 0 10*3/uL (ref 0.0–0.1)
Basophils Relative: 0 % (ref 0–1)
Eosinophils Absolute: 0.2 10*3/uL (ref 0.0–0.7)
Eosinophils Relative: 4 % (ref 0–5)
HEMATOCRIT: 41.4 % (ref 36.0–46.0)
Hemoglobin: 14.9 g/dL (ref 12.0–15.0)
LYMPHS ABS: 1.5 10*3/uL (ref 0.7–4.0)
LYMPHS PCT: 30 % (ref 12–46)
MCH: 31.4 pg (ref 26.0–34.0)
MCHC: 36 g/dL (ref 30.0–36.0)
MCV: 87.3 fL (ref 78.0–100.0)
MPV: 9.2 fL (ref 8.6–12.4)
Monocytes Absolute: 0.4 10*3/uL (ref 0.1–1.0)
Monocytes Relative: 7 % (ref 3–12)
Neutro Abs: 3 10*3/uL (ref 1.7–7.7)
Neutrophils Relative %: 59 % (ref 43–77)
PLATELETS: 277 10*3/uL (ref 150–400)
RBC: 4.74 MIL/uL (ref 3.87–5.11)
RDW: 14.8 % (ref 11.5–15.5)
WBC: 5.1 10*3/uL (ref 4.0–10.5)

## 2014-12-04 NOTE — Progress Notes (Signed)
   Subjective:    Patient ID: [name], female    DOB: [birth year], 29 y.o.   MRN: [id number]  HPI She comes in for follow-up of HIV. She has lymphoma  She was followed by oncology and had 2 cycles of chemo left but she unfortunately did not follow up and now has been discharged from the practice.  Is trying to get in to [facility] oncology.  Has had some weight loss, eating ok, no diarrhea, no rashes.  Last CD4 520 and viral load 178.     Review of Systems  Constitutional: Negative for fever, chills, appetite change and fatigue.  Gastrointestinal: Negative for nausea and diarrhea.  Musculoskeletal:       Some small joint pain, no swelling  Skin: Negative for rash.  Neurological: Negative for dizziness and light-headedness.       Objective:   Physical Exam  Constitutional: No distress.  HENT:  Mouth/Throat: No oropharyngeal exudate.  Eyes: No scleral icterus.  Cardiovascular: Normal rate, regular rhythm and normal heart sounds.   No murmur heard. Pulmonary/Chest: Effort normal and breath sounds normal. No respiratory distress. She has no wheezes.  Lymphadenopathy:    She has no cervical adenopathy.  Skin: No rash noted.  Psychiatric: She has a normal mood and affect.          Assessment & Plan:

## 2014-12-04 NOTE — Assessment & Plan Note (Signed)
Labs today, hopefully doing well.  RTC 2-3 months unless concerns.

## 2014-12-04 NOTE — Assessment & Plan Note (Signed)
Will try to facilitate getting her in [facility] oncology

## 2014-12-05 LAB — COMPLETE METABOLIC PANEL WITH GFR
ALBUMIN: 4.3 g/dL (ref 3.6–5.1)
ALT: 18 U/L (ref 6–29)
AST: 18 U/L (ref 10–30)
Alkaline Phosphatase: 81 U/L (ref 33–115)
BUN: 7 mg/dL (ref 7–25)
CHLORIDE: 102 mmol/L (ref 98–110)
CO2: 26 mmol/L (ref 20–31)
Calcium: 9.5 mg/dL (ref 8.6–10.2)
Creat: 0.66 mg/dL (ref 0.50–1.10)
GFR, Est African American: >89 mL/min (ref >=60)
GFR, Est Non African American: >89 mL/min (ref >=60)
Glucose, Bld: 76 mg/dL (ref 65–99)
Potassium: 4.4 mmol/L (ref 3.5–5.3)
SODIUM: 140 mmol/L (ref 135–146)
TOTAL PROTEIN: 7.6 g/dL (ref 6.1–8.1)
Total Bilirubin: 0.6 mg/dL (ref 0.2–1.2)

## 2014-12-05 LAB — LIPID PANEL
Cholesterol: 182 mg/dL (ref 125–200)
HDL: 34 mg/dL — ABNORMAL LOW (ref >=46)
LDL Cholesterol: 126 mg/dL (ref <130)
Total CHOL/HDL Ratio: 5.4 Ratio — ABNORMAL HIGH (ref <=5.0)
Triglycerides: 110 mg/dL (ref <150)
VLDL: 22 mg/dL (ref <30)

## 2014-12-05 LAB — T-HELPER CELL (CD4) - (RCID CLINIC ONLY)
CD4 T CELL HELPER: 32 % — AB (ref 33–55)
CD4 T Cell Abs: 490 /uL (ref 400–2700)

## 2014-12-05 LAB — RPR

## 2014-12-06 LAB — URINE CYTOLOGY ANCILLARY ONLY
CHLAMYDIA, DNA PROBE: NEGATIVE
NEISSERIA GONORRHEA: NEGATIVE

## 2014-12-06 LAB — HIV-1 RNA QUANT-NO REFLEX-BLD
HIV 1 RNA QUANT: 200 {copies}/mL — AB (ref <20)
HIV-1 RNA QUANT, LOG: 2.3 {Log} — AB (ref <1.30)

## 2014-12-10 ENCOUNTER — Other Ambulatory Visit: Payer: Self-pay | Admitting: Hematology and Oncology

## 2014-12-10 ENCOUNTER — Telehealth: Payer: Self-pay | Admitting: *Deleted

## 2014-12-10 DIAGNOSIS — C8190 Hodgkin lymphoma, unspecified, unspecified site: Secondary | ICD-10-CM

## 2014-12-10 NOTE — Telephone Encounter (Signed)
Per chart review, patient is supposed to see Dr. Alvy Bimler on Wednesday.  [facility] will wait for the results of that appointment to see if the patient still needs to transfer her care to them.  Patient will call Dr. Calton Dach office to confirm appointment time/date.

## 2014-12-10 NOTE — Telephone Encounter (Signed)
Informed mother Dr. Alvy Bimler will see pt on Wed 8/17 with lab.  Arrive at 10:45 am for lab/flush then will see MD.  Mother was appreciative says she will make sure pt keeps this appointment.

## 2014-12-10 NOTE — Telephone Encounter (Signed)
Mother asking if Dr. Alvy Bimler will reconsider seeing pt again.  Pt had been d/c'd from our clinic for multiple no shows for appointments.  Mother says pt wants to come back and just didn't understand the seriousness of her disease.  Mother says if Dr. Alvy Bimler will see her that "I will make sure she keeps her appointments."

## 2014-12-10 NOTE — Telephone Encounter (Signed)
I placed POF to see her back with port flush, labs and see me on Wednesday this week PLease remind her mother that she needs to keep her appt

## 2014-12-10 NOTE — Telephone Encounter (Signed)
Left message at [facility] to see if Tira has been accepted as a patient. Landis Gandy, RN

## 2014-12-11 ENCOUNTER — Telehealth: Payer: Self-pay | Admitting: *Deleted

## 2014-12-11 NOTE — Telephone Encounter (Signed)
Patient will keep her appointment with Dr. Alvy Bimler. She will work with Delories Heinz at [employer].  She is committed to her recovery.  [name] accepted her case. Landis Gandy, RN

## 2014-12-12 ENCOUNTER — Encounter: Payer: Self-pay | Admitting: Hematology and Oncology

## 2014-12-12 ENCOUNTER — Other Ambulatory Visit (HOSPITAL_BASED_OUTPATIENT_CLINIC_OR_DEPARTMENT_OTHER): Payer: Medicaid Other

## 2014-12-12 ENCOUNTER — Ambulatory Visit (HOSPITAL_BASED_OUTPATIENT_CLINIC_OR_DEPARTMENT_OTHER): Payer: Medicaid Other | Admitting: Hematology and Oncology

## 2014-12-12 ENCOUNTER — Ambulatory Visit (HOSPITAL_BASED_OUTPATIENT_CLINIC_OR_DEPARTMENT_OTHER): Payer: Medicaid Other

## 2014-12-12 ENCOUNTER — Ambulatory Visit: Payer: Medicaid Other

## 2014-12-12 ENCOUNTER — Telehealth: Payer: Self-pay | Admitting: Hematology and Oncology

## 2014-12-12 VITALS — BP 106/65 | HR 54 | Temp 98.2°F | Resp 18 | Ht 64.0 in | Wt 107.0 lb

## 2014-12-12 DIAGNOSIS — B20 Human immunodeficiency virus [HIV] disease: Secondary | ICD-10-CM

## 2014-12-12 DIAGNOSIS — Z9119 Patient's noncompliance with other medical treatment and regimen: Secondary | ICD-10-CM | POA: Diagnosis not present

## 2014-12-12 DIAGNOSIS — C8190 Hodgkin lymphoma, unspecified, unspecified site: Secondary | ICD-10-CM | POA: Diagnosis not present

## 2014-12-12 DIAGNOSIS — Z5111 Encounter for antineoplastic chemotherapy: Secondary | ICD-10-CM | POA: Diagnosis present

## 2014-12-12 DIAGNOSIS — C8590 Non-Hodgkin lymphoma, unspecified, unspecified site: Secondary | ICD-10-CM

## 2014-12-12 DIAGNOSIS — Z91199 Patient's noncompliance with other medical treatment and regimen due to unspecified reason: Secondary | ICD-10-CM | POA: Insufficient documentation

## 2014-12-12 LAB — COMPREHENSIVE METABOLIC PANEL (CC13)
ALBUMIN: 3.9 g/dL (ref 3.5–5.0)
ALK PHOS: 89 U/L (ref 40–150)
ALT: 17 U/L (ref 0–55)
AST: 13 U/L (ref 5–34)
Anion Gap: 8 mEq/L (ref 3–11)
BUN: 5.1 mg/dL — ABNORMAL LOW (ref 7.0–26.0)
CHLORIDE: 107 meq/L (ref 98–109)
CO2: 25 mEq/L (ref 22–29)
Calcium: 9.4 mg/dL (ref 8.4–10.4)
Creatinine: 0.7 mg/dL (ref 0.6–1.1)
GLUCOSE: 84 mg/dL (ref 70–140)
POTASSIUM: 3.7 meq/L (ref 3.5–5.1)
SODIUM: 140 meq/L (ref 136–145)
Total Bilirubin: 0.27 mg/dL (ref 0.20–1.20)
Total Protein: 7.5 g/dL (ref 6.4–8.3)

## 2014-12-12 LAB — CBC WITH DIFFERENTIAL/PLATELET
BASO%: 0.5 % (ref 0.0–2.0)
Basophils Absolute: 0 10*3/uL (ref 0.0–0.1)
EOS%: 2.6 % (ref 0.0–7.0)
Eosinophils Absolute: 0.2 10*3/uL (ref 0.0–0.5)
HEMATOCRIT: 40.9 % (ref 34.8–46.6)
HEMOGLOBIN: 14.3 g/dL (ref 11.6–15.9)
LYMPH#: 1.8 10*3/uL (ref 0.9–3.3)
LYMPH%: 30.4 % (ref 14.0–49.7)
MCH: 31.1 pg (ref 25.1–34.0)
MCHC: 35 g/dL (ref 31.5–36.0)
MCV: 88.9 fL (ref 79.5–101.0)
MONO#: 0.5 10*3/uL (ref 0.1–0.9)
MONO%: 8.8 % (ref 0.0–14.0)
NEUT%: 57.7 % (ref 38.4–76.8)
NEUTROS ABS: 3.3 10*3/uL (ref 1.5–6.5)
Platelets: 282 10*3/uL (ref 145–400)
RBC: 4.6 10*6/uL (ref 3.70–5.45)
RDW: 14.5 % (ref 11.2–14.5)
WBC: 5.8 10*3/uL (ref 3.9–10.3)
nRBC: 0 % (ref 0–0)

## 2014-12-12 MED ORDER — LEUPROLIDE ACETATE 7.5 MG IM KIT
7.5000 mg | PACK | INTRAMUSCULAR | Status: DC
  Administered 2014-12-12: 7.5 mg via INTRAMUSCULAR
  Filled 2014-12-12: qty 7.5

## 2014-12-12 MED ORDER — HEPARIN SOD (PORK) LOCK FLUSH 100 UNIT/ML IV SOLN
500.0000 [IU] | Freq: Once | INTRAVENOUS | Status: AC
  Administered 2014-12-12: 500 [IU] via INTRAVENOUS
  Filled 2014-12-12: qty 5

## 2014-12-12 MED ORDER — SODIUM CHLORIDE 0.9 % IJ SOLN
10.0000 mL | INTRAMUSCULAR | Status: DC | PRN
  Administered 2014-12-12: 10 mL via INTRAVENOUS
  Filled 2014-12-12: qty 10

## 2014-12-12 NOTE — Telephone Encounter (Signed)
Gave avs & calendar for September °

## 2014-12-12 NOTE — Assessment & Plan Note (Signed)
The patient has significant medical noncompliance and had missed many appointments and rescheduled many appointments in the past. She was discharged from my service but recently she repented and promised that she will be compliant in the future.  I also got verbal confirmation from the patient's mother that she will also make sure that the patient keeps all her appointment in the future

## 2014-12-12 NOTE — Assessment & Plan Note (Signed)
The patient has not received any chemotherapy for the last 3 months due to noncompliance. She was actually discharged from my practice but begged me to accept her back. She has agreed to be compliant in the future. I also got verbal confirmation from her mother who would also ensure that the patient will keep all appointments in the future. Clinically, she have persistent palpable lymphadenopathy throughout and I suspect she still have significant disease burden. I recommend repeat staging PET/CT scan before we resume chemotherapy. I will order a PET CT scan next week. I will resume Lupron injections to prevent pregnancy and to preserve her fertility. I will see her back on 8/29 to review test results and resume chemotherapy

## 2014-12-12 NOTE — Patient Instructions (Signed)

## 2014-12-12 NOTE — Assessment & Plan Note (Signed)
She continues taking anti-retroviral treatment. Again, I reinforced the importance of her keeping her appointments with infectious disease. She stated that she will be compliant taking her anti-retroviral treatment in the future

## 2014-12-12 NOTE — Progress Notes (Signed)
[facility] OFFICE PROGRESS NOTE  Patient Care Team: Thayer Headings, MD as PCP - General (Internal Medicine) Thayer Headings, MD as PCP - Infectious Diseases (Infectious Diseases) Woodroe Mode, MD as Consulting Physician (Obstetrics and Gynecology)  SUMMARY OF ONCOLOGIC HISTORY:   Hodgkin's lymphoma   05/06/2014 Imaging CT scan of the abdomen show diffuse mesenteric lymphadenopathy.   05/07/2014 Imaging CT scan of the chest show right thoracic inlet lymphadenopathy   06/07/2014 Procedure She underwent ultrasound-guided core biopsy of the neck lymph node   06/07/2014 Pathology Results Accession: [id number] biopsy confirmed diagnosis of Hodgkin lymphoma.   06/15/2014 Imaging Echocardiogram showed preserved ejection fraction   07/09/2014 - 07/12/2014 Hospital Admission She was admitted to the hospital for severe anemia.   07/27/2014 Procedure She had placement of port   07/31/2014 - 09/11/2014 Chemotherapy She received dose adjusted chemotherapy due to abnormal liver function tests and severe anemia. Treatment was delayed due to noncompliance  and subsequently stopped because the patient failed to keep appointments    INTERVAL HISTORY: Please see below for problem oriented charting. She returns for further follow-up. The patient had missed many appointments and was discharged from my service. Recently, the patient called and begged me to take her back as a patient. She did not give any details specifics as to why she decided to stop coming here for treatment. She understood the importance of her getting back on treatment and promised that she will not miss any further appointment in the future. She denies new lymphadenopathy. Denies recent infection.  REVIEW OF SYSTEMS:   Constitutional: Denies fevers, chills or abnormal weight loss Eyes: Denies blurriness of vision Ears, nose, mouth, throat, and face: Denies mucositis or sore throat Respiratory: Denies cough, dyspnea or  wheezes Cardiovascular: Denies palpitation, chest discomfort or lower extremity swelling Gastrointestinal:  Denies nausea, heartburn or change in bowel habits Skin: Denies abnormal skin rashes Lymphatics: Denies new lymphadenopathy or easy bruising Neurological:Denies numbness, tingling or new weaknesses Behavioral/Psych: Mood is stable, no new changes  All other systems were reviewed with the patient and are negative.  I have reviewed the past medical history, past surgical history, social history and family history with the patient and they are unchanged from previous note.  ALLERGIES:  has No Known Allergies.  MEDICATIONS:  Current Outpatient Prescriptions  Medication Sig Dispense Refill  . atazanavir (REYATAZ) 300 MG capsule Take 1 capsule (300 mg total) by mouth daily with breakfast. TAKE WITH NORVIR 30 capsule 5  . buPROPion (WELLBUTRIN XL) 150 MG 24 hr tablet Take 150 mg by mouth at bedtime.     Marland Kitchen emtricitabine-tenofovir (TRUVADA) 200-300 MG per tablet Take 1 tablet by mouth daily. 30 tablet 5  . Naphazoline HCl (CLEAR EYES OP) Apply 2 drops to eye 2 (two) times daily.    . ritonavir (NORVIR) 100 MG TABS tablet Take 1 tablet (100 mg total) by mouth daily with breakfast. TAKE WITH REYATAZ 30 tablet 5  . traZODone (DESYREL) 50 MG tablet Take 50 mg by mouth at bedtime.    Marland Kitchen ZOVIRAX 5 %   5  . acetaminophen (TYLENOL) 325 MG tablet Take 2 tablets (650 mg total) by mouth every 4 (four) hours as needed for mild pain or fever. (Patient not taking: Reported on 12/12/2014) 30 tablet 0  . lidocaine-prilocaine (EMLA) cream Apply to affected area once (Patient not taking: Reported on 12/12/2014) 30 g 3  . ondansetron (ZOFRAN) 8 MG tablet Take 1 tablet (8 mg total) by  mouth 2 (two) times daily. Start the day after chemo for 3 days. Then take as needed for nausea or vomiting. (Patient not taking: Reported on 09/19/2014) 30 tablet 1  . potassium chloride SA (K-DUR,KLOR-CON) 20 MEQ tablet Take 1 tablet  (20 mEq total) by mouth 2 (two) times daily. (Patient not taking: Reported on 12/04/2014) 60 tablet 0   No current facility-administered medications for this visit.   Facility-Administered Medications Ordered in Other Visits  Medication Dose Route Frequency Provider Last Rate Last Dose  . leuprolide (LUPRON) injection 7.5 mg  7.5 mg Intramuscular Q28 days Heath Lark, MD   7.5 mg at 12/12/14 1138    PHYSICAL EXAMINATION: ECOG PERFORMANCE STATUS: 0 - Asymptomatic  Filed Vitals:   12/12/14 1100  BP: 106/65  Pulse: 54  Temp: 98.2 F (36.8 C)  Resp: 18   Filed Weights   12/12/14 1100  Weight: 107 lb (48.535 kg)    GENERAL:alert, no distress and comfortable SKIN: skin color, texture, turgor are normal, no rashes or significant lesions EYES: normal, Conjunctiva are pink and non-injected, sclera clear OROPHARYNX:no exudate, no erythema and lips, buccal mucosa, and tongue normal  NECK: supple, thyroid normal size, non-tender, without nodularity LYMPH:  She has palpable lymphadenopathy in both sides of the neck on exam LUNGS: clear to auscultation and percussion with normal breathing effort HEART: regular rate & rhythm and no murmurs and no lower extremity edema ABDOMEN:abdomen soft, non-tender and normal bowel sounds.  Mild splenomegaly Musculoskeletal:no cyanosis of digits and no clubbing  NEURO: alert & oriented x 3 with fluent speech, no focal motor/sensory deficits  LABORATORY DATA:  I have reviewed the data as listed    Component Value Date/Time   NA 140 12/12/2014 1024   NA 140 12/04/2014 1014   K 3.7 12/12/2014 1024   K 4.4 12/04/2014 1014   CL 102 12/04/2014 1014   CO2 25 12/12/2014 1024   CO2 26 12/04/2014 1014   GLUCOSE 84 12/12/2014 1024   GLUCOSE 76 12/04/2014 1014   BUN 5.1* 12/12/2014 1024   BUN 7 12/04/2014 1014   CREATININE 0.7 12/12/2014 1024   CREATININE 0.66 12/04/2014 1014   CREATININE 0.44* 07/27/2014 0835   CALCIUM 9.4 12/12/2014 1024   CALCIUM 9.5  12/04/2014 1014   PROT 7.5 12/12/2014 1024   PROT 7.6 12/04/2014 1014   ALBUMIN 3.9 12/12/2014 1024   ALBUMIN 4.3 12/04/2014 1014   AST 13 12/12/2014 1024   AST 18 12/04/2014 1014   ALT 17 12/12/2014 1024   ALT 18 12/04/2014 1014   ALKPHOS 89 12/12/2014 1024   ALKPHOS 81 12/04/2014 1014   BILITOT 0.27 12/12/2014 1024   BILITOT 0.6 12/04/2014 1014   GFRNONAA >89 12/04/2014 1014   GFRNONAA >90 07/27/2014 0835   GFRAA >89 12/04/2014 1014   GFRAA >90 07/27/2014 0835    No results found for: SPEP, UPEP  Lab Results  Component Value Date   WBC 5.8 12/12/2014   NEUTROABS 3.3 12/12/2014   HGB 14.3 12/12/2014   HCT 40.9 12/12/2014   MCV 88.9 12/12/2014   PLT 282 12/12/2014      Chemistry      Component Value Date/Time   NA 140 12/12/2014 1024   NA 140 12/04/2014 1014   K 3.7 12/12/2014 1024   K 4.4 12/04/2014 1014   CL 102 12/04/2014 1014   CO2 25 12/12/2014 1024   CO2 26 12/04/2014 1014   BUN 5.1* 12/12/2014 1024   BUN 7 12/04/2014 1014  CREATININE 0.7 12/12/2014 1024   CREATININE 0.66 12/04/2014 1014   CREATININE 0.44* 07/27/2014 0835      Component Value Date/Time   CALCIUM 9.4 12/12/2014 1024   CALCIUM 9.5 12/04/2014 1014   ALKPHOS 89 12/12/2014 1024   ALKPHOS 81 12/04/2014 1014   AST 13 12/12/2014 1024   AST 18 12/04/2014 1014   ALT 17 12/12/2014 1024   ALT 18 12/04/2014 1014   BILITOT 0.27 12/12/2014 1024   BILITOT 0.6 12/04/2014 1014      ASSESSMENT & PLAN:  Hodgkin's lymphoma The patient has not received any chemotherapy for the last 3 months due to noncompliance. She was actually discharged from my practice but begged me to accept her back. She has agreed to be compliant in the future. I also got verbal confirmation from her mother who would also ensure that the patient will keep all appointments in the future. Clinically, she have persistent palpable lymphadenopathy throughout and I suspect she still have significant disease burden. I recommend  repeat staging PET/CT scan before we resume chemotherapy. I will order a PET CT scan next week. I will resume Lupron injections to prevent pregnancy and to preserve her fertility. I will see her back on 8/29 to review test results and resume chemotherapy  Human immunodeficiency virus (HIV) disease She continues taking anti-retroviral treatment. Again, I reinforced the importance of her keeping her appointments with infectious disease. She stated that she will be compliant taking her anti-retroviral treatment in the future  H/O noncompliance with medical treatment, presenting hazards to health The patient has significant medical noncompliance and had missed many appointments and rescheduled many appointments in the past. She was discharged from my service but recently she repented and promised that she will be compliant in the future.  I also got verbal confirmation from the patient's mother that she will also make sure that the patient keeps all her appointment in the future   Orders Placed This Encounter  Procedures  . NM PET Image Restag (PS) Skull Base To Thigh    Standing Status: Future     Number of Occurrences:      Standing Expiration Date: 02/11/2016    Scheduling Instructions:     Patient prefer mornings     Call [phone]    Order Specific Question:  Reason for Exam (SYMPTOM  OR DIAGNOSIS REQUIRED)    Answer:  hodgkin lymphoma staging    Order Specific Question:  Is the patient pregnant?    Answer:  No    Order Specific Question:  Preferred imaging location?    Answer:  [facility]  . CBC with Differential/Platelet    Standing Status: Standing     Number of Occurrences: 22     Standing Expiration Date: 12/12/2015  . Comprehensive metabolic panel    Standing Status: Standing     Number of Occurrences: 22     Standing Expiration Date: 12/12/2015  . Lactate dehydrogenase    Standing Status: Standing     Number of Occurrences: 9     Standing Expiration Date:  12/12/2015  . Uric Acid    Standing Status: Future     Number of Occurrences:      Standing Expiration Date: 01/16/2016  . Pregnancy, urine    Standing Status: Standing     Number of Occurrences: 22     Standing Expiration Date: 12/12/2015   All questions were answered. The patient knows to call the clinic with any problems, questions or concerns. No  barriers to learning was detected. I spent 25 minutes counseling the patient face to face. The total time spent in the appointment was 40 minutes and more than 50% was on counseling and review of test results     [facility], [place], MD 12/12/2014 1:46 PM

## 2014-12-13 ENCOUNTER — Telehealth: Payer: Self-pay | Admitting: *Deleted

## 2014-12-13 NOTE — Telephone Encounter (Signed)
RN contacted and spoke with the patient. RN introduced herself and excitedly asked the patient how her appt went yesterday. The patient sounded very positive and stated she has a Scan planned for next week and that will determine if she still needs Chemo. RN informed the patient that I am in her corner, praying and hoping for the best. Pt thanked me. Patient stated she would like to have my services and will give me a call before the week is out to let me know when next week. RN informed the patient that I am more than willing to meet her away from her home and will not be dressed in a nursing uniform to respect her privacy. Pt stated that would work and will be sure to call me this week.

## 2014-12-18 ENCOUNTER — Other Ambulatory Visit: Payer: Self-pay | Admitting: Internal Medicine

## 2014-12-18 ENCOUNTER — Telehealth: Payer: Self-pay | Admitting: *Deleted

## 2014-12-18 MED ORDER — HYDROCORTISONE 0.5 % EX CREA: 1.0000 "application " | TOPICAL_CREAM | Freq: Two times a day (BID) | CUTANEOUS | Status: DC

## 2014-12-18 NOTE — Telephone Encounter (Signed)
Patient asking for refill of her hydrocortisone cream. It is not an active rx.  Please advise.  Landis Gandy, RN

## 2014-12-18 NOTE — Telephone Encounter (Signed)
Sent!

## 2014-12-19 ENCOUNTER — Other Ambulatory Visit: Payer: Self-pay | Admitting: *Deleted

## 2014-12-19 MED ORDER — HYDROCORTISONE 0.5 % EX CREA: 1.0000 "application " | TOPICAL_CREAM | Freq: Two times a day (BID) | CUTANEOUS | Status: DC

## 2014-12-21 NOTE — Progress Notes (Addendum)
Patient unable to afford the hydrocortisone.  She is experiencing flaking, dry skin on her face and thought that the hydrocortisone might help. Regular lotion and OTC alternatives are not working. Please advise if there is alterative medication that she could use. Landis Gandy, RN

## 2014-12-22 NOTE — Progress Notes (Signed)
I don't know of anything else.  Is there something on [employer] formulary? thanks

## 2014-12-24 ENCOUNTER — Ambulatory Visit: Payer: Medicaid Other | Admitting: Hematology and Oncology

## 2014-12-24 ENCOUNTER — Ambulatory Visit (HOSPITAL_COMMUNITY): Payer: Medicaid Other

## 2014-12-24 ENCOUNTER — Encounter: Payer: Self-pay | Admitting: Hematology and Oncology

## 2014-12-24 ENCOUNTER — Ambulatory Visit: Payer: Medicaid Other

## 2014-12-24 ENCOUNTER — Encounter: Payer: Self-pay | Admitting: *Deleted

## 2014-12-24 NOTE — Progress Notes (Signed)
Missed appointment letter from Dr. Alvy Bimler placed in outgoing mail to pt's home address.

## 2014-12-25 ENCOUNTER — Telehealth: Payer: Self-pay | Admitting: Hematology and Oncology

## 2014-12-25 ENCOUNTER — Other Ambulatory Visit: Payer: Self-pay | Admitting: Internal Medicine

## 2014-12-25 MED ORDER — KETOCONAZOLE 2 % EX CREA: 1.0000 "application " | TOPICAL_CREAM | Freq: Two times a day (BID) | CUTANEOUS | Status: DC | PRN

## 2014-12-25 NOTE — Progress Notes (Signed)
Patient has medicaid, ketoconazole would be covered if this is appropriate.

## 2014-12-25 NOTE — Telephone Encounter (Signed)
[name] and r/s Whitehead appt.Marland KitchenMarland KitchenMarland KitchenMarland Kitchenpt ok and aware

## 2014-12-25 NOTE — Progress Notes (Signed)
Ok, sent to her pharmacy

## 2014-12-28 ENCOUNTER — Telehealth: Payer: Self-pay | Admitting: *Deleted

## 2014-12-28 NOTE — Telephone Encounter (Signed)
[name] called reporting she received Lupron "injection to stop having periods but this morning I have a period.  I used the bathroom and when I wiped there was blood on the tissue.  Could you also tell her I am taking ibuprofen for my right wrist pain but it doesn't help.  It's painful and hurts bad when I move my wrist a certain way.  I can be reached by my mobile number [phone] or my mother's home [phone]."

## 2014-12-28 NOTE — Telephone Encounter (Signed)
Patient was recent no show to office I cannot help her She needs to contact PCP for treatment

## 2014-12-28 NOTE — Telephone Encounter (Signed)
Called patient with instructions to call PCP.  "What are they to do for me, I received the injection at [facility]."  This nurse mentioned missed appointment on 12-24-2014.  " I just saw her on the 17th.  My mother was with me and we were not aware of an appointment on the 29th."  F/U was scheduled at 1147 after the 1130 visit.  [name] asked if next appointment is on 01-03-2015."  Advised this is a NM test at 0700 and to arrive to register at [phone].

## 2015-01-01 ENCOUNTER — Ambulatory Visit: Payer: Medicaid Other | Admitting: Oncology

## 2015-01-03 ENCOUNTER — Telehealth: Payer: Self-pay | Admitting: Hematology and Oncology

## 2015-01-03 ENCOUNTER — Ambulatory Visit (HOSPITAL_COMMUNITY): Payer: Medicaid Other

## 2015-01-03 NOTE — Telephone Encounter (Signed)
returned call adn s.w. pt and r/s MD visit to after PET....pt ok and aware of d.t

## 2015-01-07 ENCOUNTER — Ambulatory Visit: Payer: Medicaid Other | Admitting: Physician Assistant

## 2015-01-09 ENCOUNTER — Encounter (HOSPITAL_COMMUNITY): Payer: Self-pay | Admitting: Emergency Medicine

## 2015-01-09 ENCOUNTER — Emergency Department (HOSPITAL_COMMUNITY)
Admission: EM | Admit: 2015-01-09 | Discharge: 2015-01-10 | Discharge status: Against Medical Advice | Payer: Medicaid Other | Attending: Emergency Medicine | Admitting: Emergency Medicine

## 2015-01-09 ENCOUNTER — Emergency Department (HOSPITAL_COMMUNITY): Payer: Medicaid Other

## 2015-01-09 ENCOUNTER — Telehealth: Payer: Self-pay | Admitting: *Deleted

## 2015-01-09 DIAGNOSIS — Z21 Asymptomatic human immunodeficiency virus [HIV] infection status: Secondary | ICD-10-CM | POA: Diagnosis not present

## 2015-01-09 DIAGNOSIS — F329 Major depressive disorder, single episode, unspecified: Secondary | ICD-10-CM | POA: Diagnosis not present

## 2015-01-09 DIAGNOSIS — C8190 Hodgkin lymphoma, unspecified, unspecified site: Secondary | ICD-10-CM

## 2015-01-09 DIAGNOSIS — Z720 Tobacco use: Secondary | ICD-10-CM | POA: Diagnosis not present

## 2015-01-09 DIAGNOSIS — Z8571 Personal history of Hodgkin lymphoma: Secondary | ICD-10-CM | POA: Insufficient documentation

## 2015-01-09 DIAGNOSIS — K529 Noninfective gastroenteritis and colitis, unspecified: Secondary | ICD-10-CM

## 2015-01-09 DIAGNOSIS — Z8619 Personal history of other infectious and parasitic diseases: Secondary | ICD-10-CM | POA: Diagnosis not present

## 2015-01-09 DIAGNOSIS — Z79899 Other long term (current) drug therapy: Secondary | ICD-10-CM | POA: Insufficient documentation

## 2015-01-09 DIAGNOSIS — Z862 Personal history of diseases of the blood and blood-forming organs and certain disorders involving the immune mechanism: Secondary | ICD-10-CM | POA: Insufficient documentation

## 2015-01-09 DIAGNOSIS — R509 Fever, unspecified: Secondary | ICD-10-CM | POA: Diagnosis present

## 2015-01-09 LAB — URINALYSIS, ROUTINE W REFLEX MICROSCOPIC
GLUCOSE, UA: NEGATIVE mg/dL
Hgb urine dipstick: NEGATIVE
KETONES UR: 15 mg/dL — AB
LEUKOCYTES UA: NEGATIVE
Nitrite: NEGATIVE
PH: 5.5 (ref 5.0–8.0)
Protein, ur: 30 mg/dL — AB
SPECIFIC GRAVITY, URINE: 1.041 — AB (ref 1.005–1.030)
Urobilinogen, UA: 0.2 mg/dL (ref 0.0–1.0)

## 2015-01-09 LAB — CBC
HEMATOCRIT: 41.4 % (ref 36.0–46.0)
Hemoglobin: 14.2 g/dL (ref 12.0–15.0)
MCH: 31.1 pg (ref 26.0–34.0)
MCHC: 34.3 g/dL (ref 30.0–36.0)
MCV: 90.6 fL (ref 78.0–100.0)
PLATELETS: 248 10*3/uL (ref 150–400)
RBC: 4.57 MIL/uL (ref 3.87–5.11)
RDW: 14 % (ref 11.5–15.5)
WBC: 13.8 10*3/uL — ABNORMAL HIGH (ref 4.0–10.5)

## 2015-01-09 LAB — COMPREHENSIVE METABOLIC PANEL
ALBUMIN: 4.3 g/dL (ref 3.5–5.0)
ALT: 11 U/L — AB (ref 14–54)
AST: 15 U/L (ref 15–41)
Alkaline Phosphatase: 82 U/L (ref 38–126)
Anion gap: 10 (ref 5–15)
BUN: 11 mg/dL (ref 6–20)
CHLORIDE: 104 mmol/L (ref 101–111)
CO2: 24 mmol/L (ref 22–32)
CREATININE: 0.68 mg/dL (ref 0.44–1.00)
Calcium: 9.4 mg/dL (ref 8.9–10.3)
GFR calc Af Amer: >60 mL/min (ref >60)
GFR calc non Af Amer: >60 mL/min (ref >60)
Glucose, Bld: 120 mg/dL — ABNORMAL HIGH (ref 65–99)
POTASSIUM: 3.9 mmol/L (ref 3.5–5.1)
SODIUM: 138 mmol/L (ref 135–145)
Total Bilirubin: 0.7 mg/dL (ref 0.3–1.2)
Total Protein: 8.5 g/dL — ABNORMAL HIGH (ref 6.5–8.1)

## 2015-01-09 LAB — URINE MICROSCOPIC-ADD ON

## 2015-01-09 LAB — I-STAT BETA HCG BLOOD, ED (MC, WL, AP ONLY): I-stat hCG, quantitative: <5 m[IU]/mL (ref <5)

## 2015-01-09 LAB — LIPASE, BLOOD: LIPASE: 13 U/L — AB (ref 22–51)

## 2015-01-09 MED ORDER — ONDANSETRON HCL 4 MG/2ML IJ SOLN
4.0000 mg | Freq: Once | INTRAMUSCULAR | Status: AC
  Administered 2015-01-09: 4 mg via INTRAVENOUS
  Filled 2015-01-09: qty 2

## 2015-01-09 MED ORDER — SODIUM CHLORIDE 0.9 % IV BOLUS (SEPSIS)
1000.0000 mL | Freq: Once | INTRAVENOUS | Status: AC
  Administered 2015-01-09: 1000 mL via INTRAVENOUS

## 2015-01-09 MED ORDER — IOHEXOL 300 MG/ML  SOLN
25.0000 mL | Freq: Once | INTRAMUSCULAR | Status: AC | PRN
  Administered 2015-01-09: 25 mL via ORAL

## 2015-01-09 MED ORDER — IOHEXOL 300 MG/ML  SOLN
100.0000 mL | Freq: Once | INTRAMUSCULAR | Status: AC | PRN
  Administered 2015-01-09: 80 mL via INTRAVENOUS

## 2015-01-09 MED ORDER — SODIUM CHLORIDE 0.9 % IV SOLN
Freq: Once | INTRAVENOUS | Status: AC
  Administered 2015-01-09: via INTRAVENOUS

## 2015-01-09 NOTE — Telephone Encounter (Signed)
Received TC from pt's mother, Tammy Allred. Tammy states the patient has a fever of 100.2 and has had some chills and some nausea. She has taken ibuprofen. Tammy wants to know what else she should do.  Pt is not on active chemo at this time and has not been for a few months. Advised her to call PCP or Infectious Disease but informed her I would check with Dr. Alvy Bimler also.

## 2015-01-09 NOTE — Telephone Encounter (Signed)
TC back to pt's mother and advised her that Dr. Alvy Bimler also advises pt to see Infectious Disease physician regarding fever.  She voiced understanding.

## 2015-01-09 NOTE — Telephone Encounter (Signed)
Check with ID

## 2015-01-09 NOTE — ED Notes (Signed)
Pt c/o fever, LLQ abdominal pain, N/V/D. Reports 5+ episodes of emesis and 2 episodes of diarrhea. Last took tylenol at 1400.

## 2015-01-10 MED ORDER — ONDANSETRON HCL 4 MG PO TABS: 4.0000 mg | ORAL_TABLET | Freq: Four times a day (QID) | ORAL | Status: DC

## 2015-01-10 MED ORDER — ONDANSETRON HCL 4 MG/2ML IJ SOLN
4.0000 mg | Freq: Once | INTRAMUSCULAR | Status: AC
  Administered 2015-01-10: 4 mg via INTRAVENOUS
  Filled 2015-01-10: qty 2

## 2015-01-10 MED ORDER — CIPROFLOXACIN IN D5W 400 MG/200ML IV SOLN
400.0000 mg | Freq: Two times a day (BID) | INTRAVENOUS | Status: DC
  Administered 2015-01-10: 400 mg via INTRAVENOUS
  Filled 2015-01-10: qty 200

## 2015-01-10 MED ORDER — CIPROFLOXACIN HCL 500 MG PO TABS: 500.0000 mg | ORAL_TABLET | Freq: Two times a day (BID) | ORAL | Status: DC

## 2015-01-10 MED ORDER — METRONIDAZOLE IN NACL 5-0.79 MG/ML-% IV SOLN
500.0000 mg | Freq: Three times a day (TID) | INTRAVENOUS | Status: DC
  Administered 2015-01-10: 500 mg via INTRAVENOUS
  Filled 2015-01-10: qty 100

## 2015-01-10 MED ORDER — METRONIDAZOLE 500 MG PO TABS: 500.0000 mg | ORAL_TABLET | Freq: Three times a day (TID) | ORAL | Status: DC

## 2015-01-10 NOTE — ED Notes (Signed)
Pt given water per pt request, Dr. Alfonse Spruce advised. Pt vomited copious amount of emesis onto the floor s/p drinking water. Discussed w/ pt physicians plan to admit however pt hesitant and upset at decision for admission - pt requesting to go home, pt educated on benefits of stay d/t being unable to tolerate PO intake.

## 2015-01-10 NOTE — ED Provider Notes (Signed)
CSN: [id number]     Arrival date & time 01/09/15  1843 History   First MD Initiated Contact with Patient 01/09/15 2049     Chief Complaint  Patient presents with  . Fever     (Consider location/radiation/quality/duration/timing/severity/associated sxs/prior Treatment) HPI Comments: 29 y.o. Female with history of HIV (last CD 4 count 490), Hodgkin's lymphoma (last chemo months ago) presents for fever, nausea, vomiting, and abdominal pain.  The patient reports that she started to feel sick today.  She said that she had a low grade fever at home and that she did not feel like eating much and so took her pain medications on an empty stomach and has been vomiting since that time.  She also reports that over the course of the day she has had pain in her stomach mostly in the right lower quadrant.  The pain has been constant.  She says that she has not been taking her retroviral medications like she should be.  Patient is a 29 y.o. female presenting with fever.  Fever Associated symptoms: myalgias, nausea and vomiting   Associated symptoms: no chest pain, no chills, no congestion, no cough, no diarrhea, no dysuria, no headaches, no rash and no rhinorrhea     Past Medical History  Diagnosis Date  . HIV (human immunodeficiency virus infection)   . Depression   . Anemia   . HSV (herpes simplex virus) infection   . History of shingles   . AIN III (anal intraepithelial neoplasia III)   . Condyloma acuminatum in female   . History of chronic bronchitis   . History of esophagitis     CANDIDA  . Periodontitis, chronic   . Hodgkin's lymphoma 06/12/2014  . Cancer     Hodgkin lymphoma  . Hypokalemia 07/17/2014   Past Surgical History  Procedure Laterality Date  . Dilation and curettage of uterus  2005    MISSED AB  . Examination under anesthesia N/A 09/23/2012    Procedure: EXAM UNDER ANESTHESIA;  Surgeon: Adin Hector, MD;  Location: [facility];  Service: General;  Laterality:  N/A;  . Laser ablation condolamata N/A 09/23/2012    Procedure: REMOVAL/ABLATION  ABLATION CONDOLAMATA WARTS;  Surgeon: Adin Hector, MD;  [facility];  Service: General;  Laterality: N/A;   Family History  Problem Relation Age of Onset  . Cancer Maternal Aunt     unknown ca  . Cancer Maternal Grandmother     unknown ca   Social History  Substance Use Topics  . Smoking status: Current Every Day Smoker -- 0.00 packs/day for 7 years    Types: Cigars    Start date: 03/19/2014  . Smokeless tobacco: Never Used     Comment: she smokes 3 Black and Mild Cigars daily  . Alcohol Use: No   OB History    Gravida Para Term Preterm AB TAB SAB Ectopic Multiple Living   4 0 0  2  1 1   0     Review of Systems  Constitutional: Positive for fever and fatigue. Negative for chills.  HENT: Negative for congestion, postnasal drip and rhinorrhea.   Eyes: Negative for pain and redness.  Respiratory: Negative for cough, chest tightness and shortness of breath.   Cardiovascular: Negative for chest pain and palpitations.  Gastrointestinal: Positive for nausea, vomiting and abdominal pain. Negative for diarrhea and constipation.  Genitourinary: Negative for dysuria, urgency and frequency.  Musculoskeletal: Positive for myalgias. Negative for back pain.  Skin: Negative for rash.  Neurological: Negative for dizziness, weakness, light-headedness and headaches.  Hematological: Negative for adenopathy. Does not bruise/bleed easily.      Allergies  Review of patient's allergies indicates no known allergies.  Home Medications   Prior to Admission medications   Medication Sig Start Date End Date Taking? Authorizing Provider  atazanavir (REYATAZ) 300 MG capsule Take 1 capsule (300 mg total) by mouth daily with breakfast. TAKE WITH NORVIR 09/12/14  Yes Truman Hayward, MD  buPROPion (WELLBUTRIN XL) 150 MG 24 hr tablet Take 150 mg by mouth at bedtime.    Yes Historical  Provider, MD  emtricitabine-tenofovir (TRUVADA) 200-300 MG per tablet Take 1 tablet by mouth daily. 09/12/14  Yes Truman Hayward, MD  hydrocortisone cream 0.5 % Apply 1 application topically 2 (two) times daily. 12/19/14  Yes Thayer Headings, MD  ibuprofen (ADVIL,MOTRIN) 200 MG tablet Take 400 mg by mouth every 6 (six) hours as needed for moderate pain.   Yes Historical Provider, MD  ketoconazole (NIZORAL) 2 % cream Apply 1 application topically 2 (two) times daily as needed for irritation. 12/25/14  Yes Thayer Headings, MD  lidocaine-prilocaine (EMLA) cream Apply to affected area once 06/26/14  Yes Heath Lark, MD  Naphazoline HCl (CLEAR EYES OP) Apply 2 drops to eye 2 (two) times daily.   Yes Historical Provider, MD  ondansetron (ZOFRAN) 8 MG tablet Take 1 tablet (8 mg total) by mouth 2 (two) times daily. Start the day after chemo for 3 days. Then take as needed for nausea or vomiting. 06/26/14  Yes Heath Lark, MD  ritonavir (NORVIR) 100 MG TABS tablet Take 1 tablet (100 mg total) by mouth daily with breakfast. TAKE WITH REYATAZ 09/12/14  Yes Truman Hayward, MD  traZODone (DESYREL) 50 MG tablet Take 50 mg by mouth at bedtime.   Yes Historical Provider, MD  potassium chloride SA (K-DUR,KLOR-CON) 20 MEQ tablet Take 1 tablet (20 mEq total) by mouth 2 (two) times daily. Patient not taking: Reported on 12/04/2014 07/17/14   Heath Lark, MD   BP 119/57 mmHg  Pulse 48  Temp(Src) 98.9 F (37.2 C) (Rectal)  Resp 22  SpO2 100%  LMP 12/17/2014  Breastfeeding? Unknown Physical Exam  Constitutional: She is oriented to person, place, and time. She appears well-developed and well-nourished. No distress.  HENT:  Head: Normocephalic and atraumatic.  Right Ear: External ear normal.  Left Ear: External ear normal.  Mouth/Throat: Oropharynx is clear and moist.  Eyes: EOM are normal. Pupils are equal, round, and reactive to light.  Neck: Normal range of motion. Neck supple.  Cardiovascular: Normal rate,  regular rhythm, normal heart sounds and intact distal pulses.   No murmur heard. Pulmonary/Chest: Effort normal. No respiratory distress. She has no wheezes. She has no rales.  Abdominal: Normal appearance and bowel sounds are normal. There is tenderness (mild to moderate) in the right lower quadrant and periumbilical area.  Musculoskeletal: Normal range of motion. She exhibits no edema or tenderness.  Neurological: She is alert and oriented to person, place, and time.  Skin: Skin is warm and dry. No rash noted. She is not diaphoretic.  Vitals reviewed.   ED Course  Procedures (including critical care time) Labs Review Labs Reviewed  LIPASE, BLOOD - Abnormal; Notable for the following:    Lipase 13 (*)    All other components within normal limits  COMPREHENSIVE METABOLIC PANEL - Abnormal; Notable for the following:    Glucose, Bld  120 (*)    Total Protein 8.5 (*)    ALT 11 (*)    All other components within normal limits  CBC - Abnormal; Notable for the following:    WBC 13.8 (*)    All other components within normal limits  URINALYSIS, ROUTINE W REFLEX MICROSCOPIC (NOT AT [facility]) - Abnormal; Notable for the following:    Color, Urine AMBER (*)    APPearance CLOUDY (*)    Specific Gravity, Urine 1.041 (*)    Bilirubin Urine SMALL (*)    Ketones, ur 15 (*)    Protein, ur 30 (*)    All other components within normal limits  URINE MICROSCOPIC-ADD ON - Abnormal; Notable for the following:    Squamous Epithelial / LPF FEW (*)    All other components within normal limits  I-STAT BETA HCG BLOOD, ED ([facility], [facility], [facility] ONLY)    Imaging Review Ct Abdomen Pelvis W Contrast  01/09/2015   CLINICAL DATA:  Fever with left lower quadrant pain, nausea, vomiting and diarrhea. Five episodes of emesis in 2 episodes of diarrhea. History of Hodgkin's lymphoma and HIV. No current chemotherapy.  EXAM: CT ABDOMEN AND PELVIS WITH CONTRAST  TECHNIQUE: Multidetector CT imaging of the abdomen and pelvis was  performed using the standard protocol following bolus administration of intravenous contrast.  CONTRAST:  27mL OMNIPAQUE IOHEXOL 300 MG/ML SOLN, 67mL OMNIPAQUE IOHEXOL 300 MG/ML SOLN  COMPARISON:  PET-CT 07/02/2014 and abdominal pelvic CT 05/06/2014  FINDINGS: Lung bases are normal.  Abdominal images demonstrate decrease in the previous noted splenomegaly with decrease heterogeneity of the spleen compatible with interval improvement in patient's known Hodgkin's lymphoma. Persistent abdominal/ periaortic/iliac chain adenopathy with moderate interval improvement.  The liver, pancreas, gallbladder and adrenal glands are within normal. Kidneys are within normal. Appendix is not well visualized.  Vascular structures are within normal.  There is no free fluid.  Small bowel is within normal.  Colon is somewhat decompressed with mild wall thickening throughout which may be due to mild colitis of infectious or inflammatory nature.  Pelvic images demonstrate the bladder, uterus and left ovary to be within normal. There is a 3.6 cm right ovarian dermoid cyst. Remainder the exam is unremarkable.  IMPRESSION: Mild wall thickening throughout the colon which may be due to mild acute colitis of infectious or inflammatory nature.  Interval decrease in size and heterogeneity of the spleen as well as interval decrease size and number of abdominal/periaortic/iliac chain adenopathy compatible with positive chemotherapy response of patient's Hodgkin's lymphoma.  3.6 cm right ovarian dermoid cyst.   Electronically Signed   By: Marin Olp M.D.   On: 01/09/2015 23:45   I have personally reviewed and evaluated these images and lab results as part of my medical decision-making.   EKG Interpretation None      MDM  Patient was seen and evaluated in stable condition.  Patient actively vomiting on arrival.  Symptoms improved with fluids and zofran.  Laboratory results revealed leukocytosis.  CT abdomen with findings consistent with  colitis and right sided dermoid cyst.  Symptoms consistent with colitis.  Symptoms did not appear consistent with torsion and seemed more related to infection and inflammation.  Patient told about finding of cyst and need for obgyn follow up.  Patient also advised taht it is recommended she be admitted especially after she failed a PO challenge but she refused although she expressed understanding of clinical concerns and recommendations.  She was agreeable to stay for initial dose of  Cipro/Flagyl.  She said she would return immediately with worsening symptoms and that she will follow up with her ID physician tomorrow.  Patient was agreeable to signing out against medical advice.   Final diagnoses:  None    1. Colitis  2. Dermoid cyst  3. Acute nausea and vomiting    Harvel Quale, MD 01/10/15 [phone]

## 2015-01-10 NOTE — Discharge Instructions (Signed)
Colitis Colitis is inflammation of the colon. Colitis can be a short-term or long-standing (chronic) illness. Crohn's disease and ulcerative colitis are 2 types of colitis which are chronic. They usually require lifelong treatment. CAUSES  There are many different causes of colitis, including:  Viruses.  Germs (bacteria).  Medicine reactions. SYMPTOMS   Diarrhea.  Intestinal bleeding.  Pain.  Fever.  Throwing up (vomiting).  Tiredness (fatigue).  Weight loss.  Bowel blockage. DIAGNOSIS  The diagnosis of colitis is based on examination and stool or blood tests. X-rays, CT scan, and colonoscopy may also be needed. TREATMENT  Treatment may include:  Fluids given through the vein (intravenously).  Bowel rest (nothing to eat or drink for a period of time).  Medicine for pain and diarrhea.  Medicines (antibiotics) that kill germs.  Cortisone medicines.  Surgery. HOME CARE INSTRUCTIONS   Get plenty of rest.  Drink enough water and fluids to keep your urine clear or pale yellow.  Eat a well-balanced diet.  Call your caregiver for follow-up as recommended. SEEK IMMEDIATE MEDICAL CARE IF:   You develop chills.  You have an oral temperature above 102 F (38.9 C), not controlled by medicine.  You have extreme weakness, fainting, or dehydration.  You have repeated vomiting.  You develop severe belly (abdominal) pain or are passing bloody or tarry stools. MAKE SURE YOU:   Understand these instructions.  Will watch your condition.  Will get help right away if you are not doing well or get worse. Document Released: 05/21/2004 Document Revised: 07/06/2011 Document Reviewed: 08/16/2009 [facility] Patient Information 2015 [place], [place]. This information is not intended to replace advice given to you by your health care provider. Make sure you discuss any questions you have with your health care provider.   Ovarian Cyst An ovarian cyst is a fluid-filled sac  that forms on an ovary. The ovaries are small organs that produce eggs in women. Various types of cysts can form on the ovaries. Most are not cancerous. Many do not cause problems, and they often go away on their own. Some may cause symptoms and require treatment. Common types of ovarian cysts include:  Functional cysts--These cysts may occur every month during the menstrual cycle. This is normal. The cysts usually go away with the next menstrual cycle if the woman does not get pregnant. Usually, there are no symptoms with a functional cyst.  Endometrioma cysts--These cysts form from the tissue that lines the uterus. They are also called "chocolate cysts" because they become filled with blood that turns brown. This type of cyst can cause pain in the lower abdomen during intercourse and with your menstrual period.  Cystadenoma cysts--This type develops from the cells on the outside of the ovary. These cysts can get very big and cause lower abdomen pain and pain with intercourse. This type of cyst can twist on itself, cut off its blood supply, and cause severe pain. It can also easily rupture and cause a lot of pain.  Dermoid cysts--This type of cyst is sometimes found in both ovaries. These cysts may contain different kinds of body tissue, such as skin, teeth, hair, or cartilage. They usually do not cause symptoms unless they get very big.  Theca lutein cysts--These cysts occur when too much of a certain hormone (human chorionic gonadotropin) is produced and overstimulates the ovaries to produce an egg. This is most common after procedures used to assist with the conception of a baby (in vitro fertilization). CAUSES   Fertility drugs can  cause a condition in which multiple large cysts are formed on the ovaries. This is called ovarian hyperstimulation syndrome.  A condition called polycystic ovary syndrome can cause hormonal imbalances that can lead to nonfunctional ovarian cysts. SIGNS AND SYMPTOMS    Many ovarian cysts do not cause symptoms. If symptoms are present, they may include:  Pelvic pain or pressure.  Pain in the lower abdomen.  Pain during sexual intercourse.  Increasing girth (swelling) of the abdomen.  Abnormal menstrual periods.  Increasing pain with menstrual periods.  Stopping having menstrual periods without being pregnant. DIAGNOSIS  These cysts are commonly found during a routine or annual pelvic exam. Tests may be ordered to find out more about the cyst. These tests may include:  Ultrasound.  X-ray of the pelvis.  CT scan.  MRI.  Blood tests. TREATMENT  Many ovarian cysts go away on their own without treatment. Your health care provider may want to check your cyst regularly for 2-3 months to see if it changes. For women in menopause, it is particularly important to monitor a cyst closely because of the higher rate of ovarian cancer in menopausal women. When treatment is needed, it may include any of the following:  A procedure to drain the cyst (aspiration). This may be done using a long needle and ultrasound. It can also be done through a laparoscopic procedure. This involves using a thin, lighted tube with a tiny camera on the end (laparoscope) inserted through a small incision.  Surgery to remove the whole cyst. This may be done using laparoscopic surgery or an open surgery involving a larger incision in the lower abdomen.  Hormone treatment or birth control pills. These methods are sometimes used to help dissolve a cyst. HOME CARE INSTRUCTIONS   Only take over-the-counter or prescription medicines as directed by your health care provider.  Follow up with your health care provider as directed.  Get regular pelvic exams and Pap tests. SEEK MEDICAL CARE IF:   Your periods are late, irregular, or painful, or they stop.  Your pelvic pain or abdominal pain does not go away.  Your abdomen becomes larger or swollen.  You have pressure on your  bladder or trouble emptying your bladder completely.  You have pain during sexual intercourse.  You have feelings of fullness, pressure, or discomfort in your stomach.  You lose weight for no apparent reason.  You feel generally ill.  You become constipated.  You lose your appetite.  You develop acne.  You have an increase in body and facial hair.  You are gaining weight, without changing your exercise and eating habits.  You think you are pregnant. SEEK IMMEDIATE MEDICAL CARE IF:   You have increasing abdominal pain.  You feel sick to your stomach (nauseous), and you throw up (vomit).  You develop a fever that comes on suddenly.  You have abdominal pain during a bowel movement.  Your menstrual periods become heavier than usual. MAKE SURE YOU:  Understand these instructions.  Will watch your condition.  Will get help right away if you are not doing well or get worse. Document Released: 04/13/2005 Document Revised: 04/18/2013 Document Reviewed: 12/19/2012 [facility] Patient Information 2015 [place], [place]. This information is not intended to replace advice given to you by your health care provider. Make sure you discuss any questions you have with your health care provider.

## 2015-01-10 NOTE — ED Notes (Signed)
Pt ambulating independently w/ steady gait on d/c in no acute distress, A&Ox4. D/c instructions reviewed w/ pt and family - pt and family deny any further questions or concerns at present. Rx given x3  

## 2015-01-11 ENCOUNTER — Ambulatory Visit (HOSPITAL_COMMUNITY)
Admission: RE | Admit: 2015-01-11 | Discharge: 2015-01-11 | Disposition: A | Discharge status: Home / Self Care | Payer: Medicaid Other | Source: Ambulatory Visit | Attending: Hematology and Oncology | Admitting: Hematology and Oncology

## 2015-01-11 ENCOUNTER — Telehealth: Payer: Self-pay | Admitting: *Deleted

## 2015-01-11 DIAGNOSIS — R161 Splenomegaly, not elsewhere classified: Secondary | ICD-10-CM | POA: Insufficient documentation

## 2015-01-11 DIAGNOSIS — B20 Human immunodeficiency virus [HIV] disease: Secondary | ICD-10-CM | POA: Insufficient documentation

## 2015-01-11 DIAGNOSIS — Z9221 Personal history of antineoplastic chemotherapy: Secondary | ICD-10-CM | POA: Diagnosis not present

## 2015-01-11 DIAGNOSIS — D270 Benign neoplasm of right ovary: Secondary | ICD-10-CM | POA: Insufficient documentation

## 2015-01-11 DIAGNOSIS — C8190 Hodgkin lymphoma, unspecified, unspecified site: Secondary | ICD-10-CM | POA: Diagnosis present

## 2015-01-11 LAB — GLUCOSE, CAPILLARY: Glucose-Capillary: 84 mg/dL (ref 65–99)

## 2015-01-11 MED ORDER — FLUDEOXYGLUCOSE F - 18 (FDG) INJECTION
5.3000 | Freq: Once | INTRAVENOUS | Status: DC | PRN
  Administered 2015-01-11: 5.3 via INTRAVENOUS
  Filled 2015-01-11: qty 5.3

## 2015-01-11 MED ORDER — TECHNETIUM TC 99M MEDRONATE IV KIT: 5.3000 | PACK | Freq: Once | INTRAVENOUS | Status: DC | PRN

## 2015-01-11 NOTE — Telephone Encounter (Signed)
Patient reached out to [facility] to see if she needed an appointment after d/c from the ED last night.  Patient is able to keep food/fluids down without nausea or vomiting.  She denies fever. She has not had a bowel movement yet.  She knows to be NPO for 6 hours prior to her PET scan on Friday 9/16.  She knows to return to the ED if the symptoms recur.  She is keeping her previously scheduled follow up.

## 2015-01-21 ENCOUNTER — Ambulatory Visit: Payer: Medicaid Other

## 2015-01-22 ENCOUNTER — Ambulatory Visit (HOSPITAL_BASED_OUTPATIENT_CLINIC_OR_DEPARTMENT_OTHER): Payer: Medicaid Other | Admitting: Physician Assistant

## 2015-01-22 ENCOUNTER — Telehealth: Payer: Self-pay | Admitting: Hematology and Oncology

## 2015-01-22 ENCOUNTER — Telehealth: Payer: Self-pay | Admitting: *Deleted

## 2015-01-22 ENCOUNTER — Other Ambulatory Visit: Payer: Self-pay | Admitting: Hematology and Oncology

## 2015-01-22 VITALS — BP 127/84 | HR 62 | Temp 97.7°F | Resp 17 | Ht 64.0 in | Wt 105.4 lb

## 2015-01-22 DIAGNOSIS — C8190 Hodgkin lymphoma, unspecified, unspecified site: Secondary | ICD-10-CM

## 2015-01-22 DIAGNOSIS — Z9119 Patient's noncompliance with other medical treatment and regimen: Secondary | ICD-10-CM | POA: Diagnosis not present

## 2015-01-22 DIAGNOSIS — Z21 Asymptomatic human immunodeficiency virus [HIV] infection status: Secondary | ICD-10-CM | POA: Diagnosis not present

## 2015-01-22 DIAGNOSIS — Z91199 Patient's noncompliance with other medical treatment and regimen due to unspecified reason: Secondary | ICD-10-CM

## 2015-01-22 DIAGNOSIS — B20 Human immunodeficiency virus [HIV] disease: Secondary | ICD-10-CM

## 2015-01-22 NOTE — Progress Notes (Signed)
Hematology and Oncology Follow Up Visit  [name] [id number] [birth year] 29 y.o. 01/22/2015 2:08 PM  Principle Diagnosis: Hodgkin's lymphoma   Staging form: Lymphoid Neoplasms, AJCC 6th Edition     Clinical stage from 06/14/2014: Stage III - Signed by Heath Lark, MD on 06/14/2014  Prior Therapy: none  Current therapy: Systemic chemotherapy with ABVD ( status post 1 1/2 cycles, last given 09/11/2014)  Interim History:  The patient presents for a follow up appointment, unaccompanied. She has not received any chemotherapy since mid-May 2016 secondary to noncompliance. She recently had restaging imaging studies sone and presents to discuss the results. She was recently evaluated in the emergency room for abdominal pain, nausea and vomiting. She was told she has a cyst on her ovary and is scheduled for further evaluation and management at [facility] hospital on 02/08/2015. She continues to take her anti-retroviral medications and is due for a follow up appointment with Infectious Disease 04/08/2015.  Medications: I have reviewed the patient's current medications.  Allergies: No Known Allergies  Past Medical History, Surgical history, Social history, and Family History were reviewed and updated.  Review of Systems: Review of Systems  Constitutional: Negative for fever, chills, weight loss, malaise/fatigue and diaphoresis.  HENT: Negative for congestion, ear discharge, ear pain, hearing loss, nosebleeds, sore throat and tinnitus.   Eyes: Negative for blurred vision, double vision, photophobia, pain, discharge and redness.  Respiratory: Negative for cough, hemoptysis, sputum production, shortness of breath, wheezing and stridor.   Cardiovascular: Negative for chest pain, palpitations, orthopnea, claudication, leg swelling and PND.  Gastrointestinal: Positive for nausea, vomiting and abdominal pain. Negative for heartburn, diarrhea, constipation, blood in stool and melena.  Genitourinary:  Negative.   Musculoskeletal: Negative.   Skin: Negative.   Neurological: Negative for dizziness, tingling, focal weakness, seizures, weakness and headaches.  Endo/Heme/Allergies: Does not bruise/bleed easily.  Psychiatric/Behavioral: Negative for depression. The patient is not nervous/anxious and does not have insomnia.     Remaining ROS negative.  Physical Exam: Blood pressure 127/84, pulse 62, temperature 97.7 F (36.5 C), temperature source Oral, resp. rate 17, height 5\' 4"  (1.626 m), weight 105 lb 6.4 oz (47.809 kg), last menstrual period 12/17/2014. ECOG:  Physical Exam  Constitutional: She is oriented to person, place, and time and well-developed, well-nourished, and in no distress.  HENT:  Head: Normocephalic and atraumatic.  Mouth/Throat: Oropharynx is clear and moist.  Eyes: Pupils are equal, round, and reactive to light.  Neck: Normal range of motion. Neck supple. No JVD present. No tracheal deviation present. No thyromegaly present.  Cardiovascular: Normal rate, regular rhythm, normal heart sounds and intact distal pulses.  Exam reveals no gallop and no friction rub.   No murmur heard. Pulmonary/Chest: Effort normal and breath sounds normal. No respiratory distress. She has no wheezes. She has no rales.  Abdominal: Soft. Bowel sounds are normal. She exhibits no distension and no mass. There is no tenderness.  Musculoskeletal: Normal range of motion. She exhibits no edema or tenderness.  Lymphadenopathy:    She has cervical adenopathy.  Neurological: She is alert and oriented to person, place, and time. She has normal reflexes. Gait normal.  Skin: Skin is warm and dry. No rash noted.      Lab Results: Lab Results  Component Value Date   WBC 13.8* 01/09/2015   HGB 14.2 01/09/2015   HCT 41.4 01/09/2015   MCV 90.6 01/09/2015   PLT 248 01/09/2015     Chemistry      Component Value  Date/Time   NA 138 01/09/2015 2027   NA 140 12/12/2014 1024   K 3.9 01/09/2015 2027    K 3.7 12/12/2014 1024   CL 104 01/09/2015 2027   CO2 24 01/09/2015 2027   CO2 25 12/12/2014 1024   BUN 11 01/09/2015 2027   BUN 5.1* 12/12/2014 1024   CREATININE 0.68 01/09/2015 2027   CREATININE 0.7 12/12/2014 1024   CREATININE 0.66 12/04/2014 1014      Component Value Date/Time   CALCIUM 9.4 01/09/2015 2027   CALCIUM 9.4 12/12/2014 1024   ALKPHOS 82 01/09/2015 2027   ALKPHOS 89 12/12/2014 1024   AST 15 01/09/2015 2027   AST 13 12/12/2014 1024   ALT 11* 01/09/2015 2027   ALT 17 12/12/2014 1024   BILITOT 0.7 01/09/2015 2027   BILITOT 0.27 12/12/2014 1024       Radiological Studies: Ct Abdomen Pelvis W Contrast  01/09/2015   CLINICAL DATA:  Fever with left lower quadrant pain, nausea, vomiting and diarrhea. Five episodes of emesis in 2 episodes of diarrhea. History of Hodgkin's lymphoma and HIV. No current chemotherapy.  EXAM: CT ABDOMEN AND PELVIS WITH CONTRAST  TECHNIQUE: Multidetector CT imaging of the abdomen and pelvis was performed using the standard protocol following bolus administration of intravenous contrast.  CONTRAST:  11mL OMNIPAQUE IOHEXOL 300 MG/ML SOLN, 6mL OMNIPAQUE IOHEXOL 300 MG/ML SOLN  COMPARISON:  PET-CT 07/02/2014 and abdominal pelvic CT 05/06/2014  FINDINGS: Lung bases are normal.  Abdominal images demonstrate decrease in the previous noted splenomegaly with decrease heterogeneity of the spleen compatible with interval improvement in patient's known Hodgkin's lymphoma. Persistent abdominal/ periaortic/iliac chain adenopathy with moderate interval improvement.  The liver, pancreas, gallbladder and adrenal glands are within normal. Kidneys are within normal. Appendix is not well visualized.  Vascular structures are within normal.  There is no free fluid.  Small bowel is within normal.  Colon is somewhat decompressed with mild wall thickening throughout which may be due to mild colitis of infectious or inflammatory nature.  Pelvic images demonstrate the bladder,  uterus and left ovary to be within normal. There is a 3.6 cm right ovarian dermoid cyst. Remainder the exam is unremarkable.  IMPRESSION: Mild wall thickening throughout the colon which may be due to mild acute colitis of infectious or inflammatory nature.  Interval decrease in size and heterogeneity of the spleen as well as interval decrease size and number of abdominal/periaortic/iliac chain adenopathy compatible with positive chemotherapy response of patient's Hodgkin's lymphoma.  3.6 cm right ovarian dermoid cyst.   Electronically Signed   By: Marin Olp M.D.   On: 01/09/2015 23:45   Nm Pet Image Restag (ps) Skull Base To Thigh  01/11/2015   CLINICAL DATA:  Subsequent treatment strategy for Hodgkin's lymphoma. Status post chemotherapy. HIV.  EXAM: NUCLEAR MEDICINE PET SKULL BASE TO THIGH  TECHNIQUE: 5.3 mCi F-18 FDG was injected intravenously. Full-ring PET imaging was performed from the skull base to thigh after the radiotracer. CT data was obtained and used for attenuation correction and anatomic localization.  FASTING BLOOD GLUCOSE:  Value: 84 mg/dl  COMPARISON:  07/02/2014  FINDINGS: NECK  Hypermetabolic bilateral supraclavicular lymphadenopathy has nearly completely resolved compared to prior exam. Index lymph node in right supraclavicular region now measures less than 1 cm compared to 1.9 cm previously. This has an SUV max of 2.6 compared to 4.7 previously.  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 Previously seen mild hypermetabolic mediastinal lymphadenopathy in the prevascular region has  resolved. Decrease in mild right hilar lymphadenopathy is seen which has an SUV max of 3.7 compared to 9.6 previously.  Subcentimeter left hilar lymph nodes show new hypermetabolic activity with SUV max of 4.3. Sub-cm subcarinal mediastinal lymph node also shows new hypermetabolic activity with SUV max of 4.3. New sub-cm hypermetabolic bilateral axillary lymph nodes are seen,  with index lymph node in the right axillary region having SUV max of 5.5.  No suspicious pulmonary nodules identified. No evidence of pleural effusion.  ABDOMEN/PELVIS  No abnormal hypermetabolic activity within the liver, pancreas, or adrenal glands. Decreased splenomegaly is seen as well as decreased hypermetabolic activity throughout the spleen.  Near complete resolution of bulky hypermetabolic lymphadenopathy throughout the abdominal retroperitoneum and bilateral iliac chains is seen since previous study. New sub-cm hypermetabolic lymph nodes noted in both inguinal regions with index node in the left inguinal region having SUV max of 4.6.  3.5 cm right ovarian dermoid remains stable.  SKELETON  Widespread hypermetabolic bone lesions abdomen throughout the axial and proximal appendicular skeleton show significant decrease since previous study. Index lesion in the right clavicular head has SUV max of 3.0 compared to 10.9 previously. Index lesion in the right posterior ilium has SUV max of 3.4 compared to 8.0 previously.  IMPRESSION: Significant partial metabolic response to therapy since prior exam. Overall significantly decreased hypermetabolic lymphadenopathy within the neck, chest, abdomen, and pelvis, although some new sub-cm hypermetabolic lymph nodes are identified within the chest and pelvis.  Decreased splenomegaly and hypermetabolic splenic activity since prior study. Decreased bone marrow involvement in the axial and appendicular skeleton also demonstrated.  Stable 3.5 cm right ovarian dermoid incidentally noted.   Electronically Signed   By: Earle Gell M.D.   On: 01/11/2015 13:53    Impression and Plan: The patient is a 29 year old African American female with Hodgkin's Lymphoma and HIV. She has not had any chemotherapy in the last 4 months due to her non-compliance. Her recent restaging imaging studies showed a significant partial response to therapy. The patient was reviewed with Dr. Alvy Bimler.  Mckinzy will be scheduled to resume systemic chemotherapy with ABVD next week. She will have labs, see Dr Alvy Bimler and resume chemotherapy in one week. The importance of compliance with all appointments was again addressed with the paitent and she voiced both understanding and stated she would be compliant and would call if she needed to reschedule an appointment instead of just not showing up.  Spent more than half the time coordinating care.    Carlton Adam, PA-C 9/27/20162:08 PM

## 2015-01-22 NOTE — Telephone Encounter (Signed)
Per staff message and POF I have scheduled appts. Advised scheduler of appts. JMW  

## 2015-01-22 NOTE — Telephone Encounter (Signed)
per pof tos ch pt appt-sent Mw email to sch pt trmt-will call pt after reply °

## 2015-01-23 NOTE — Patient Instructions (Signed)
Your PET scan showed a partial response Follow up in one week for re-evaluation and to resume chemotherapy

## 2015-01-24 ENCOUNTER — Telehealth: Payer: Self-pay | Admitting: *Deleted

## 2015-01-24 NOTE — Telephone Encounter (Signed)
Mother left VM earlier states she missed call from our office.  Dr. Alvy Bimler did not call pt..  I called her back assuming it was Scheduler about appts on Monday.   Mother says she did get a call back from Scheduling and is aware of her daughter's appts this Monday 10/3.

## 2015-01-28 ENCOUNTER — Other Ambulatory Visit: Payer: Self-pay | Admitting: Hematology and Oncology

## 2015-01-28 ENCOUNTER — Encounter: Payer: Self-pay | Admitting: Hematology and Oncology

## 2015-01-28 ENCOUNTER — Ambulatory Visit (HOSPITAL_BASED_OUTPATIENT_CLINIC_OR_DEPARTMENT_OTHER): Payer: Medicaid Other | Admitting: Hematology and Oncology

## 2015-01-28 ENCOUNTER — Telehealth: Payer: Self-pay | Admitting: Hematology and Oncology

## 2015-01-28 ENCOUNTER — Telehealth: Payer: Self-pay | Admitting: *Deleted

## 2015-01-28 ENCOUNTER — Other Ambulatory Visit (HOSPITAL_BASED_OUTPATIENT_CLINIC_OR_DEPARTMENT_OTHER): Payer: Medicaid Other

## 2015-01-28 ENCOUNTER — Ambulatory Visit (HOSPITAL_BASED_OUTPATIENT_CLINIC_OR_DEPARTMENT_OTHER): Payer: Medicaid Other

## 2015-01-28 ENCOUNTER — Ambulatory Visit: Payer: Medicaid Other

## 2015-01-28 ENCOUNTER — Other Ambulatory Visit (HOSPITAL_COMMUNITY)
Admission: RE | Admit: 2015-01-28 | Discharge: 2015-01-28 | Disposition: A | Discharge status: Home / Self Care | Payer: Medicaid Other | Source: Ambulatory Visit | Attending: Hematology and Oncology | Admitting: Hematology and Oncology

## 2015-01-28 VITALS — BP 125/74 | HR 59 | Temp 97.4°F | Resp 20 | Ht 64.0 in | Wt 104.1 lb

## 2015-01-28 DIAGNOSIS — C8194 Hodgkin lymphoma, unspecified, lymph nodes of axilla and upper limb: Secondary | ICD-10-CM

## 2015-01-28 DIAGNOSIS — C8178 Other classical Hodgkin lymphoma, lymph nodes of multiple sites: Secondary | ICD-10-CM

## 2015-01-28 DIAGNOSIS — Z5111 Encounter for antineoplastic chemotherapy: Secondary | ICD-10-CM | POA: Diagnosis present

## 2015-01-28 DIAGNOSIS — C8190 Hodgkin lymphoma, unspecified, unspecified site: Secondary | ICD-10-CM | POA: Insufficient documentation

## 2015-01-28 DIAGNOSIS — Z9119 Patient's noncompliance with other medical treatment and regimen: Secondary | ICD-10-CM

## 2015-01-28 DIAGNOSIS — B20 Human immunodeficiency virus [HIV] disease: Secondary | ICD-10-CM | POA: Diagnosis not present

## 2015-01-28 DIAGNOSIS — R945 Abnormal results of liver function studies: Secondary | ICD-10-CM

## 2015-01-28 DIAGNOSIS — Z91199 Patient's noncompliance with other medical treatment and regimen due to unspecified reason: Secondary | ICD-10-CM

## 2015-01-28 DIAGNOSIS — R7989 Other specified abnormal findings of blood chemistry: Secondary | ICD-10-CM

## 2015-01-28 DIAGNOSIS — Z95828 Presence of other vascular implants and grafts: Secondary | ICD-10-CM

## 2015-01-28 LAB — CBC WITH DIFFERENTIAL/PLATELET
BASO%: 0.5 % (ref 0.0–2.0)
Basophils Absolute: 0 10*3/uL (ref 0.0–0.1)
EOS%: 1.6 % (ref 0.0–7.0)
Eosinophils Absolute: 0.1 10*3/uL (ref 0.0–0.5)
HCT: 42.2 % (ref 34.8–46.6)
HGB: 14.5 g/dL (ref 11.6–15.9)
LYMPH%: 25.9 % (ref 14.0–49.7)
MCH: 31.5 pg (ref 25.1–34.0)
MCHC: 34.4 g/dL (ref 31.5–36.0)
MCV: 91.7 fL (ref 79.5–101.0)
MONO#: 0.4 10*3/uL (ref 0.1–0.9)
MONO%: 6.5 % (ref 0.0–14.0)
NEUT%: 65.5 % (ref 38.4–76.8)
NEUTROS ABS: 3.7 10*3/uL (ref 1.5–6.5)
Platelets: 280 10*3/uL (ref 145–400)
RBC: 4.6 10*6/uL (ref 3.70–5.45)
RDW: 14.3 % (ref 11.2–14.5)
WBC: 5.7 10*3/uL (ref 3.9–10.3)
lymph#: 1.5 10*3/uL (ref 0.9–3.3)

## 2015-01-28 LAB — LACTATE DEHYDROGENASE (CC13): LDH: 201 U/L (ref 125–245)

## 2015-01-28 LAB — COMPREHENSIVE METABOLIC PANEL (CC13)
ALT: 11 U/L (ref 0–55)
ANION GAP: 7 meq/L (ref 3–11)
AST: 16 U/L (ref 5–34)
Albumin: 3.8 g/dL (ref 3.5–5.0)
Alkaline Phosphatase: 90 U/L (ref 40–150)
BILIRUBIN TOTAL: 2.03 mg/dL — AB (ref 0.20–1.20)
BUN: 7.4 mg/dL (ref 7.0–26.0)
CHLORIDE: 106 meq/L (ref 98–109)
CO2: 26 meq/L (ref 22–29)
Calcium: 10 mg/dL (ref 8.4–10.4)
Creatinine: 0.7 mg/dL (ref 0.6–1.1)
GLUCOSE: 86 mg/dL (ref 70–140)
Potassium: 4 mEq/L (ref 3.5–5.1)
SODIUM: 139 meq/L (ref 136–145)
TOTAL PROTEIN: 7.9 g/dL (ref 6.4–8.3)

## 2015-01-28 LAB — URIC ACID (CC13): URIC ACID, SERUM: 4 mg/dL (ref 2.6–7.4)

## 2015-01-28 LAB — PREGNANCY, URINE: PREG TEST UR: NEGATIVE

## 2015-01-28 MED ORDER — SODIUM CHLORIDE 0.9 % IJ SOLN
10.0000 mL | INTRAMUSCULAR | Status: DC | PRN
  Administered 2015-01-28: 10 mL
  Filled 2015-01-28: qty 10

## 2015-01-28 MED ORDER — DOXORUBICIN HCL CHEMO IV INJECTION 2 MG/ML
25.0000 mg/m2 | Freq: Once | INTRAVENOUS | Status: AC
  Administered 2015-01-28: 38 mg via INTRAVENOUS
  Filled 2015-01-28: qty 19

## 2015-01-28 MED ORDER — SODIUM CHLORIDE 0.9 % IV SOLN
9.5000 [IU]/m2 | Freq: Once | INTRAVENOUS | Status: AC
  Administered 2015-01-28: 15 [IU] via INTRAVENOUS
  Filled 2015-01-28: qty 5

## 2015-01-28 MED ORDER — VINBLASTINE SULFATE CHEMO INJECTION 1 MG/ML
5.8000 mg/m2 | Freq: Once | INTRAVENOUS | Status: AC
  Administered 2015-01-28: 9 mg via INTRAVENOUS
  Filled 2015-01-28: qty 9

## 2015-01-28 MED ORDER — SODIUM CHLORIDE 0.9 % IJ SOLN
10.0000 mL | INTRAMUSCULAR | Status: DC | PRN
  Administered 2015-01-28: 10 mL via INTRAVENOUS
  Filled 2015-01-28: qty 10

## 2015-01-28 MED ORDER — DEXAMETHASONE SODIUM PHOSPHATE 100 MG/10ML IJ SOLN
Freq: Once | INTRAMUSCULAR | Status: AC
  Administered 2015-01-28: 13:00:00 via INTRAVENOUS
  Filled 2015-01-28: qty 8

## 2015-01-28 MED ORDER — DACARBAZINE 200 MG IV SOLR
375.0000 mg/m2 | Freq: Once | INTRAVENOUS | Status: AC
  Administered 2015-01-28: 580 mg via INTRAVENOUS
  Filled 2015-01-28: qty 29

## 2015-01-28 MED ORDER — LEUPROLIDE ACETATE 7.5 MG IM KIT
7.5000 mg | PACK | INTRAMUSCULAR | Status: DC
  Administered 2015-01-28: 7.5 mg via INTRAMUSCULAR
  Filled 2015-01-28: qty 7.5

## 2015-01-28 MED ORDER — HEPARIN SOD (PORK) LOCK FLUSH 100 UNIT/ML IV SOLN
500.0000 [IU] | Freq: Once | INTRAVENOUS | Status: AC | PRN
  Administered 2015-01-28: 500 [IU]
  Filled 2015-01-28: qty 5

## 2015-01-28 MED ORDER — SODIUM CHLORIDE 0.9 % IV SOLN
Freq: Once | INTRAVENOUS | Status: AC
  Administered 2015-01-28: 13:00:00 via INTRAVENOUS

## 2015-01-28 NOTE — Telephone Encounter (Signed)
per pof tos ch pt appt-sent MW email to sch trmt per pof-pt to get updated sch b4 leaving

## 2015-01-28 NOTE — Patient Instructions (Signed)
[facility] Discharge Instructions for Patients Receiving Chemotherapy  Today you received the following chemotherapy agents adriamycin/bleomycin/vinblastine/dacarbazine   To help prevent nausea and vomiting after your treatment, we encourage you to take your nausea medication as directed   If you develop nausea and vomiting that is not controlled by your nausea medication, call the clinic.   BELOW ARE SYMPTOMS THAT SHOULD BE REPORTED IMMEDIATELY:  *FEVER GREATER THAN 100.5 F  *CHILLS WITH OR WITHOUT FEVER  NAUSEA AND VOMITING THAT IS NOT CONTROLLED WITH YOUR NAUSEA MEDICATION  *UNUSUAL SHORTNESS OF BREATH  *UNUSUAL BRUISING OR BLEEDING  TENDERNESS IN MOUTH AND THROAT WITH OR WITHOUT PRESENCE OF ULCERS  *URINARY PROBLEMS  *BOWEL PROBLEMS  UNUSUAL RASH Items with * indicate a potential emergency and should be followed up as soon as possible.  Feel free to call the clinic you have any questions or concerns. The clinic phone number is [phone].  Doxorubicin injection What is this medicine? DOXORUBICIN (dox oh ROO bi sin) is a chemotherapy drug. It is used to treat many kinds of cancer like Hodgkin's disease, leukemia, non-Hodgkin's lymphoma, neuroblastoma, sarcoma, and Wilms' tumor. It is also used to treat bladder cancer, breast cancer, lung cancer, ovarian cancer, stomach cancer, and thyroid cancer. This medicine may be used for other purposes; ask your health care provider or pharmacist if you have questions. COMMON BRAND NAME(S): Adriamycin, Adriamycin PFS, Adriamycin RDF, Rubex What should I tell my health care provider before I take this medicine? They need to know if you have any of these conditions: -blood disorders -heart disease, recent heart attack -infection (especially a virus infection such as chickenpox, cold sores, or herpes) -irregular heartbeat -liver disease -recent or ongoing radiation therapy -an unusual or allergic reaction to  doxorubicin, other chemotherapy agents, other medicines, foods, dyes, or preservatives -pregnant or trying to get pregnant -breast-feeding How should I use this medicine? This drug is given as an infusion into a vein. It is administered in a hospital or clinic by a specially trained health care professional. If you have pain, swelling, burning or any unusual feeling around the site of your injection, tell your health care professional right away. Talk to your pediatrician regarding the use of this medicine in children. Special care may be needed. Overdosage: If you think you have taken too much of this medicine contact a poison control center or emergency room at once. NOTE: This medicine is only for you. Do not share this medicine with others. What if I miss a dose? It is important not to miss your dose. Call your doctor or health care professional if you are unable to keep an appointment. What may interact with this medicine? Do not take this medicine with any of the following medications: -cisapride -droperidol -halofantrine -pimozide -zidovudine This medicine may also interact with the following medications: -chloroquine -chlorpromazine -clarithromycin -cyclophosphamide -cyclosporine -erythromycin -medicines for depression, anxiety, or psychotic disturbances -medicines for irregular heart beat like amiodarone, bepridil, dofetilide, encainide, flecainide, propafenone, quinidine -medicines for seizures like ethotoin, fosphenytoin, phenytoin -medicines for nausea, vomiting like dolasetron, ondansetron, palonosetron -medicines to increase blood counts like filgrastim, pegfilgrastim, sargramostim -methadone -methotrexate -pentamidine -progesterone -vaccines -verapamil Talk to your doctor or health care professional before taking any of these medicines: -acetaminophen -aspirin -ibuprofen -ketoprofen -naproxen This list may not describe all possible interactions. Give your  health care provider a list of all the medicines, herbs, non-prescription drugs, or dietary supplements you use. Also tell them if you smoke, drink alcohol, or use   illegal drugs. Some items may interact with your medicine. What should I watch for while using this medicine? Your condition will be monitored carefully while you are receiving this medicine. You will need important blood work done while you are taking this medicine. This drug may make you feel generally unwell. This is not uncommon, as chemotherapy can affect healthy cells as well as cancer cells. Report any side effects. Continue your course of treatment even though you feel ill unless your doctor tells you to stop. Your urine may turn red for a few days after your dose. This is not blood. If your urine is dark or brown, call your doctor. In some cases, you may be given additional medicines to help with side effects. Follow all directions for their use. Call your doctor or health care professional for advice if you get a fever, chills or sore throat, or other symptoms of a cold or flu. Do not treat yourself. This drug decreases your body's ability to fight infections. Try to avoid being around people who are sick. This medicine may increase your risk to bruise or bleed. Call your doctor or health care professional if you notice any unusual bleeding. Be careful brushing and flossing your teeth or using a toothpick because you may get an infection or bleed more easily. If you have any dental work done, tell your dentist you are receiving this medicine. Avoid taking products that contain aspirin, acetaminophen, ibuprofen, naproxen, or ketoprofen unless instructed by your doctor. These medicines may hide a fever. Men and women of childbearing age should use effective birth control methods while using taking this medicine. Do not become pregnant while taking this medicine. There is a potential for serious side effects to an unborn child. Talk to  your health care professional or pharmacist for more information. Do not breast-feed an infant while taking this medicine. Do not let others touch your urine or other body fluids for 5 days after each treatment with this medicine. Caregivers should wear latex gloves to avoid touching body fluids during this time. There is a maximum amount of this medicine you should receive throughout your life. The amount depends on the medical condition being treated and your overall health. Your doctor will watch how much of this medicine you receive in your lifetime. Tell your doctor if you have taken this medicine before. What side effects may I notice from receiving this medicine? Side effects that you should report to your doctor or health care professional as soon as possible: -allergic reactions like skin rash, itching or hives, swelling of the face, lips, or tongue -low blood counts - this medicine may decrease the number of white blood cells, red blood cells and platelets. You may be at increased risk for infections and bleeding. -signs of infection - fever or chills, cough, sore throat, pain or difficulty passing urine -signs of decreased platelets or bleeding - bruising, pinpoint red spots on the skin, black, tarry stools, blood in the urine -signs of decreased red blood cells - unusually weak or tired, fainting spells, lightheadedness -breathing problems -chest pain -fast, irregular heartbeat -mouth sores -nausea, vomiting -pain, swelling, redness at site where injected -pain, tingling, numbness in the hands or feet -swelling of ankles, feet, or hands -unusual bleeding or bruising Side effects that usually do not require medical attention (report to your doctor or health care professional if they continue or are bothersome): -diarrhea -facial flushing -hair loss -loss of appetite -missed menstrual periods -nail discoloration or damage -red   or watery eyes -red colored urine -stomach  upset This list may not describe all possible side effects. Call your doctor for medical advice about side effects. You may report side effects to FDA at 1-800-FDA-1088. Where should I keep my medicine? This drug is given in a hospital or clinic and will not be stored at home. NOTE: This sheet is a summary. It may not cover all possible information. If you have questions about this medicine, talk to your doctor, pharmacist, or health care provider.  2015, [employer] Standard. (2012-08-09 09:54:34)  Bleomycin injection What is this medicine? BLEOMYCIN (blee oh MYE sin) is a chemotherapy drug. It is used to treat many kinds of cancer like lymphoma, cervical cancer, head and neck cancer, and testicular cancer. It is also used to prevent and to treat fluid build-up around the lungs caused by some cancers. This medicine may be used for other purposes; ask your health care provider or pharmacist if you have questions. COMMON BRAND NAME(S): Blenoxane What should I tell my health care provider before I take this medicine? They need to know if you have any of these conditions: -cigarette smoker -kidney disease -lung disease -recent or ongoing radiation therapy -an unusual or allergic reaction to bleomycin, other chemotherapy agents, other medicines, foods, dyes, or preservatives -pregnant or trying to get pregnant -breast-feeding How should I use this medicine? This drug is given as an infusion into a vein or a body cavity. It can also be given as an injection into a muscle or under the skin. It is administered in a hospital or clinic by a specially trained health care professional. Talk to your pediatrician regarding the use of this medicine in children. Special care may be needed. Overdosage: If you think you have taken too much of this medicine contact a poison control center or emergency room at once. NOTE: This medicine is only for you. Do not share this medicine with others. What if I miss  a dose? It is important not to miss your dose. Call your doctor or health care professional if you are unable to keep an appointment. What may interact with this medicine? -certain antibiotics given by injection -cisplatin -cyclosporine -diuretics -foscarnet -medicines to increase blood counts like filgrastim, pegfilgrastim, sargramostim -vaccines This list may not describe all possible interactions. Give your health care provider a list of all the medicines, herbs, non-prescription drugs, or dietary supplements you use. Also tell them if you smoke, drink alcohol, or use illegal drugs. Some items may interact with your medicine. What should I watch for while using this medicine? Visit your doctor for checks on your progress. This drug may make you feel generally unwell. This is not uncommon, as chemotherapy can affect healthy cells as well as cancer cells. Report any side effects. Continue your course of treatment even though you feel ill unless your doctor tells you to stop. Call your doctor or health care professional for advice if you get a fever, chills or sore throat, or other symptoms of a cold or flu. Do not treat yourself. This drug decreases your body's ability to fight infections. Try to avoid being around people who are sick. Avoid taking products that contain aspirin, acetaminophen, ibuprofen, naproxen, or ketoprofen unless instructed by your doctor. These medicines may hide a fever. Do not become pregnant while taking this medicine. Women should inform their doctor if they wish to become pregnant or think they might be pregnant. There is a potential for serious side effects to an unborn child.   Talk to your health care professional or pharmacist for more information. Do not breast-feed an infant while taking this medicine. There is a maximum amount of this medicine you should receive throughout your life. The amount depends on the medical condition being treated and your overall health.  Your doctor will watch how much of this medicine you receive in your lifetime. Tell your doctor if you have taken this medicine before. What side effects may I notice from receiving this medicine? Side effects that you should report to your doctor or health care professional as soon as possible: -allergic reactions like skin rash, itching or hives, swelling of the face, lips, or tongue -breathing problems -chest pain -confusion -cough -fast, irregular heartbeat -feeling faint or lightheaded, falls -fever or chills -mouth sores -pain, tingling, numbness in the hands or feet -trouble passing urine or change in the amount of urine -yellowing of the eyes or skin Side effects that usually do not require medical attention (report to your doctor or health care professional if they continue or are bothersome): -darker skin color -hair loss -irritation at site where injected -loss of appetite -nail changes -nausea and vomiting -weight loss This list may not describe all possible side effects. Call your doctor for medical advice about side effects. You may report side effects to FDA at 1-800-FDA-1088. Where should I keep my medicine? This drug is given in a hospital or clinic and will not be stored at home. NOTE: This sheet is a summary. It may not cover all possible information. If you have questions about this medicine, talk to your doctor, pharmacist, or health care provider.  2015, [employer] Standard. (2012-08-09 09:36:48)  Vinblastine injection What is this medicine? VINBLASTINE (vin BLAS teen) is a chemotherapy drug. It slows the growth of cancer cells. This medicine is used to treat many types of cancer like breast cancer, testicular cancer, Hodgkin's disease, non-Hodgkin's lymphoma, and sarcoma. This medicine may be used for other purposes; ask your health care provider or pharmacist if you have questions. COMMON BRAND NAME(S): Velban What should I tell my health care provider  before I take this medicine? They need to know if you have any of these conditions: -blood disorders -dental disease -gout -infection (especially a virus infection such as chickenpox, cold sores, or herpes) -liver disease -lung disease -nervous system disease -recent or ongoing radiation therapy -an unusual or allergic reaction to vinblastine, other chemotherapy agents, other medicines, foods, dyes, or preservatives -pregnant or trying to get pregnant -breast-feeding How should I use this medicine? This drug is given as an infusion into a vein. It is administered in a hospital or clinic by a specially trained health care professional. If you have pain, swelling, burning or any unusual feeling around the site of your injection, tell your health care professional right away. Talk to your pediatrician regarding the use of this medicine in children. While this drug may be prescribed for selected conditions, precautions do apply. Overdosage: If you think you have taken too much of this medicine contact a poison control center or emergency room at once. NOTE: This medicine is only for you. Do not share this medicine with others. What if I miss a dose? It is important not to miss your dose. Call your doctor or health care professional if you are unable to keep an appointment. What may interact with this medicine? Do not take this medicine with any of the following medications: -erythromycin -itraconazole -mibefradil -voriconazole This medicine may also interact with the following medications: -  cyclosporine -fluconazole -ketoconazole -medicines for seizures like phenytoin -medicines to increase blood counts like filgrastim, pegfilgrastim, sargramostim -vaccines -verapamil Talk to your doctor or health care professional before taking any of these medicines: -acetaminophen -aspirin -ibuprofen -ketoprofen -naproxen This list may not describe all possible interactions. Give your health  care provider a list of all the medicines, herbs, non-prescription drugs, or dietary supplements you use. Also tell them if you smoke, drink alcohol, or use illegal drugs. Some items may interact with your medicine. What should I watch for while using this medicine? Your condition will be monitored carefully while you are receiving this medicine. You will need important blood work done while you are taking this medicine. This drug may make you feel generally unwell. This is not uncommon, as chemotherapy can affect healthy cells as well as cancer cells. Report any side effects. Continue your course of treatment even though you feel ill unless your doctor tells you to stop. In some cases, you may be given additional medicines to help with side effects. Follow all directions for their use. Call your doctor or health care professional for advice if you get a fever, chills or sore throat, or other symptoms of a cold or flu. Do not treat yourself. This drug decreases your body's ability to fight infections. Try to avoid being around people who are sick. This medicine may increase your risk to bruise or bleed. Call your doctor or health care professional if you notice any unusual bleeding. Be careful brushing and flossing your teeth or using a toothpick because you may get an infection or bleed more easily. If you have any dental work done, tell your dentist you are receiving this medicine. Avoid taking products that contain aspirin, acetaminophen, ibuprofen, naproxen, or ketoprofen unless instructed by your doctor. These medicines may hide a fever. Do not become pregnant while taking this medicine. Women should inform their doctor if they wish to become pregnant or think they might be pregnant. There is a potential for serious side effects to an unborn child. Talk to your health care professional or pharmacist for more information. Do not breast-feed an infant while taking this medicine. Men may have a lower  sperm count while taking this medicine. Talk to your doctor if you plan to father a child. What side effects may I notice from receiving this medicine? Side effects that you should report to your doctor or health care professional as soon as possible: -allergic reactions like skin rash, itching or hives, swelling of the face, lips, or tongue -low blood counts - This drug may decrease the number of white blood cells, red blood cells and platelets. You may be at increased risk for infections and bleeding. -signs of infection - fever or chills, cough, sore throat, pain or difficulty passing urine -signs of decreased platelets or bleeding - bruising, pinpoint red spots on the skin, black, tarry stools, nosebleeds -signs of decreased red blood cells - unusually weak or tired, fainting spells, lightheadedness -breathing problems -changes in hearing -change in the amount of urine -chest pain -high blood pressure -mouth sores -nausea and vomiting -pain, swelling, redness or irritation at the injection site -pain, tingling, numbness in the hands or feet -problems with balance, dizziness -seizures Side effects that usually do not require medical attention (report to your doctor or health care professional if they continue or are bothersome): -constipation -hair loss -jaw pain -loss of appetite -sensitivity to light -stomach pain -tumor pain This list may not describe all possible side effects.   Call your doctor for medical advice about side effects. You may report side effects to FDA at 1-800-FDA-1088. Where should I keep my medicine? This drug is given in a hospital or clinic and will not be stored at home. NOTE: This sheet is a summary. It may not cover all possible information. If you have questions about this medicine, talk to your doctor, pharmacist, or health care provider.  2015, [employer] Standard. (2008-01-09 17:15:59)  Dacarbazine, DTIC injection What is this  medicine? DACARBAZINE (da KAR ba zeen) is a chemotherapy drug. This medicine is used to treat skin cancer. It is also used with other medicines to treat Hodgkin's disease. This medicine may be used for other purposes; ask your health care provider or pharmacist if you have questions. COMMON BRAND NAME(S): DTIC-Dome What should I tell my health care provider before I take this medicine? They need to know if you have any of these conditions: -infection (especially virus infection such as chickenpox, cold sores, or herpes) -kidney disease -liver disease -low blood counts like low platelets, red blood cells, white blood cells -recent radiation therapy -an unusual or allergic reaction to dacarbazine, other chemotherapy agents, other medicines, foods, dyes, or preservatives -pregnant or trying to get pregnant -breast-feeding How should I use this medicine? This drug is given as an injection or infusion into a vein. It is administered in a hospital or clinic by a specially trained health care professional. Talk to your pediatrician regarding the use of this medicine in children. While this drug may be prescribed for selected conditions, precautions do apply. Overdosage: If you think you have taken too much of this medicine contact a poison control center or emergency room at once. NOTE: This medicine is only for you. Do not share this medicine with others. What if I miss a dose? It is important not to miss your dose. Call your doctor or health care professional if you are unable to keep an appointment. What may interact with this medicine? -medicines to increase blood counts like filgrastim, pegfilgrastim, sargramostim -vaccines This list may not describe all possible interactions. Give your health care provider a list of all the medicines, herbs, non-prescription drugs, or dietary supplements you use. Also tell them if you smoke, drink alcohol, or use illegal drugs. Some items may interact with  your medicine. What should I watch for while using this medicine? Your condition will be monitored carefully while you are receiving this medicine. You will need important blood work done while you are taking this medicine. This drug may make you feel generally unwell. This is not uncommon, as chemotherapy can affect healthy cells as well as cancer cells. Report any side effects. Continue your course of treatment even though you feel ill unless your doctor tells you to stop. Call your doctor or health care professional for advice if you get a fever, chills or sore throat, or other symptoms of a cold or flu. Do not treat yourself. This drug decreases your body's ability to fight infections. Try to avoid being around people who are sick. This medicine may increase your risk to bruise or bleed. Call your doctor or health care professional if you notice any unusual bleeding. Be careful brushing and flossing your teeth or using a toothpick because you may get an infection or bleed more easily. If you have any dental work done, tell your dentist you are receiving this medicine. Avoid taking products that contain aspirin, acetaminophen, ibuprofen, naproxen, or ketoprofen unless instructed by your doctor. These medicines   may hide a fever. Do not become pregnant while taking this medicine. Women should inform their doctor if they wish to become pregnant or think they might be pregnant. There is a potential for serious side effects to an unborn child. Talk to your health care professional or pharmacist for more information. Do not breast-feed an infant while taking this medicine. What side effects may I notice from receiving this medicine? Side effects that you should report to your doctor or health care professional as soon as possible: -allergic reactions like skin rash, itching or hives, swelling of the face, lips, or tongue -low blood counts - this medicine may decrease the number of white blood cells, red  blood cells and platelets. You may be at increased risk for infections and bleeding. -signs of infection - fever or chills, cough, sore throat, pain or difficulty passing urine -signs of decreased platelets or bleeding - bruising, pinpoint red spots on the skin, black, tarry stools, blood in the urine -signs of decreased red blood cells - unusually weak or tired, fainting spells, lightheadedness -breathing problems -muscle pains -pain at site where injected -trouble passing urine or change in the amount of urine -vomiting -yellowing of the eyes or skin Side effects that usually do not require medical attention (report to your doctor or health care professional if they continue or are bothersome): -diarrhea -hair loss -loss of appetite -nausea -skin more sensitive to sun or ultraviolet light -stomach upset This list may not describe all possible side effects. Call your doctor for medical advice about side effects. You may report side effects to FDA at 1-800-FDA-1088. Where should I keep my medicine? This drug is given in a hospital or clinic and will not be stored at home. NOTE: This sheet is a summary. It may not cover all possible information. If you have questions about this medicine, talk to your doctor, pharmacist, or health care provider.  2015, Elsevier/Gold Standard. (2007-08-02 16:56:39) Leuprolide depot injection or implant What is this medicine? LEUPROLIDE (loo PROE lide) is a man-made protein that acts like a natural hormone in the body. It decreases testosterone in men and decreases estrogen in women. In men, this medicine is used to treat advanced prostate cancer. In women, some forms of this medicine may be used to treat endometriosis, uterine fibroids, or other female hormone-related problems. This medicine may be used for other purposes; ask your health care provider or pharmacist if you have questions. COMMON BRAND NAME(S): Eligard, Lupron Depot, Lupron Depot-Ped,  Viadur What should I tell my health care provider before I take this medicine? They need to know if you have any of these conditions: -diabetes -heart disease or previous heart attack -high blood pressure -high cholesterol -osteoporosis -pain or difficulty passing urine -spinal cord metastasis -stroke -tobacco smoker -unusual vaginal bleeding (women) -an unusual or allergic reaction to leuprolide, benzyl alcohol, other medicines, foods, dyes, or preservatives -pregnant or trying to get pregnant -breast-feeding How should I use this medicine? This medicine is for injection into a muscle or for implant or injection under the skin. It is given by a health care professional in a hospital or clinic setting. The specific product will determine how it will be given to you. Make sure you understand which product you receive and how often you will receive it. Talk to your pediatrician regarding the use of this medicine in children. Special care may be needed. Overdosage: If you think you have taken too much of this medicine contact a poison control center   or emergency room at once. NOTE: This medicine is only for you. Do not share this medicine with others. What if I miss a dose? It is important not to miss a dose. Call your doctor or health care professional if you are unable to keep an appointment. Depot injections: Depot injections are given either once-monthly, every 12 weeks, every 16 weeks, or every 24 weeks depending on the product you are prescribed. The product you are prescribed will be based on if you are female or female, and your condition. Make sure you understand your product and dosing. Implant dosing: The implant is removed and replaced once a year. The implant is only used in males. What may interact with this medicine? Do not take this medicine with any of the following medications: -chasteberry This medicine may also interact with the following medications: -herbal or dietary  supplements, like black cohosh or DHEA -female hormones, like estrogens or progestins and birth control pills, patches, rings, or injections -female hormones, like testosterone This list may not describe all possible interactions. Give your health care provider a list of all the medicines, herbs, non-prescription drugs, or dietary supplements you use. Also tell them if you smoke, drink alcohol, or use illegal drugs. Some items may interact with your medicine. What should I watch for while using this medicine? Visit your doctor or health care professional for regular checks on your progress. During the first weeks of treatment, your symptoms may get worse, but then will improve as you continue your treatment. You may get hot flashes, increased bone pain, increased difficulty passing urine, or an aggravation of nerve symptoms. Discuss these effects with your doctor or health care professional, some of them may improve with continued use of this medicine. Female patients may experience a menstrual cycle or spotting during the first months of therapy with this medicine. If this continues, contact your doctor or health care professional. What side effects may I notice from receiving this medicine? Side effects that you should report to your doctor or health care professional as soon as possible: -allergic reactions like skin rash, itching or hives, swelling of the face, lips, or tongue -breathing problems -chest pain -depression or memory disorders -pain in your legs or groin -pain at site where injected or implanted -severe headache -swelling of the feet and legs -visual changes -vomiting Side effects that usually do not require medical attention (report to your doctor or health care professional if they continue or are bothersome): -breast swelling or tenderness -decrease in sex drive or performance -diarrhea -hot flashes -loss of appetite -muscle, joint, or bone pains -nausea -redness or  irritation at site where injected or implanted -skin problems or acne This list may not describe all possible side effects. Call your doctor for medical advice about side effects. You may report side effects to FDA at 1-800-FDA-1088. Where should I keep my medicine? This drug is given in a hospital or clinic and will not be stored at home. NOTE: This sheet is a summary. It may not cover all possible information. If you have questions about this medicine, talk to your doctor, pharmacist, or health care provider.  2015, Elsevier/Gold Standard. (2009-10-15 14:41:21)  

## 2015-01-28 NOTE — Assessment & Plan Note (Signed)
Her function tests has thankfully recovered fully. I will increase the dose of the chemotherapy to full dose.  

## 2015-01-28 NOTE — Assessment & Plan Note (Signed)
She continues taking anti-retroviral treatment. Again, I reinforced the importance of her keeping her appointments with infectious disease. She stated that she will be compliant taking her anti-retroviral treatment in the future 

## 2015-01-28 NOTE — Progress Notes (Signed)
Pt in for port access for labs/ infusion. Pt states the area around port has been very itchy, states it has been that way since it was placed in April and it is getting worse. Noted 2  small red bumps on the right side of port approx 1 inch away and one small red bump aprox 1 inch below port. Pt states she has always used the emla cream and doesn't notice a difference in itching with or without it. Placed opsite over port needle today for possible tape allergy. Informed pt to inform MD if it gets any worse. Pt verbalized understanding.

## 2015-01-28 NOTE — Patient Instructions (Signed)

## 2015-01-28 NOTE — Assessment & Plan Note (Signed)
The patient has not received any chemotherapy for the last 3 months due to noncompliance. She was actually discharged from my practice but begged me to accept her back. She has agreed to be compliant in the future. I also got verbal confirmation from her mother who would also ensure that the patient will keep all appointments in the future. PET CT scan show residual disease. She is interested to resume treatment. We will continue Lupron injection every 4 weeks to protect her future fertility. I will see her in 2 weeks to assess for side effects of treatment.

## 2015-01-28 NOTE — Telephone Encounter (Signed)
Per staff message and POF I have scheduled appts. Advised scheduler of appts. JMW  

## 2015-01-28 NOTE — Assessment & Plan Note (Signed)
The patient has significant medical noncompliance and had missed many appointments and rescheduled many appointments in the past. She was discharged from my service but recently she repented and promised that she will be compliant in the future.  I also got verbal confirmation from the patient's mother that she will also make sure that the patient keeps all her appointment in the future 

## 2015-01-28 NOTE — Progress Notes (Signed)
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   05/07/2014 Imaging CT scan of the chest show right thoracic inlet lymphadenopathy   06/07/2014 Procedure She underwent ultrasound-guided core biopsy of the neck lymph node   06/07/2014 Pathology Results Accession: [id number] biopsy confirmed diagnosis of Hodgkin lymphoma.   06/15/2014 Imaging Echocardiogram showed preserved ejection fraction   07/09/2014 - 07/12/2014 Hospital Admission She was admitted to the hospital for severe anemia.   07/27/2014 Procedure She had placement of port   07/31/2014 - 09/11/2014 Chemotherapy She received dose adjusted chemotherapy due to abnormal liver function tests and severe anemia. Treatment was delayed due to noncompliance  and subsequently stopped because the patient failed to keep appointments   01/11/2015 Imaging Repeat PET CT scan showed response to treatment     INTERVAL HISTORY: Please see below for problem oriented charting. She is seen today for further follow-up. She had PET CT scan which show excellent response to treatment. She is not symptomatic. She stated she has been compliant taking her anti-retroviral therapy.  REVIEW OF SYSTEMS:   Constitutional: Denies fevers, chills or abnormal weight loss Eyes: Denies blurriness of vision Ears, nose, mouth, throat, and face: Denies mucositis or sore throat Respiratory: Denies cough, dyspnea or wheezes Cardiovascular: Denies palpitation, chest discomfort or lower extremity swelling Gastrointestinal:  Denies nausea, heartburn or change in bowel habits Skin:  Denies abnormal skin rashes Lymphatics: Denies new lymphadenopathy or easy bruising Neurological:Denies numbness, tingling or new weaknesses Behavioral/Psych: Mood is stable, no new changes  All other systems were reviewed with the patient and are negative.  I have reviewed the past medical history, past surgical history, social history and family history with the patient and they are unchanged from previous note.  ALLERGIES:  has No Known Allergies.  MEDICATIONS:  Current Outpatient Prescriptions  Medication Sig Dispense Refill  . atazanavir (REYATAZ) 300 MG capsule Take 1 capsule (300 mg total) by mouth daily with breakfast. TAKE WITH NORVIR 30 capsule 5  . buPROPion (WELLBUTRIN XL) 150 MG 24 hr tablet Take 150 mg by mouth at bedtime.     Marland Kitchen emtricitabine-tenofovir (TRUVADA) 200-300 MG per tablet Take 1 tablet by mouth daily. 30 tablet 5  . hydrocortisone cream 0.5 % Apply 1 application topically 2 (two) times daily. 30 g 1  . ibuprofen (ADVIL,MOTRIN) 200 MG tablet Take 400 mg by mouth every 6 (six) hours as needed for moderate pain.    Marland Kitchen ketoconazole (NIZORAL) 2 % cream Apply 1 application topically 2 (two) times daily as needed for irritation. 60 g 3  . lidocaine-prilocaine (EMLA) cream Apply to affected area once 30 g 3  . Naphazoline HCl (CLEAR EYES OP) Apply 2 drops to eye 2 (two) times daily.    . ritonavir (NORVIR) 100 MG TABS tablet Take 1 tablet (100 mg total) by mouth daily with breakfast. TAKE WITH REYATAZ 30 tablet 5  . traZODone (DESYREL) 50 MG tablet Take 50 mg by mouth at bedtime.    . ondansetron (ZOFRAN) 4 MG tablet Take 1 tablet (4 mg total) by mouth every 6 (six) hours. (Patient not taking: Reported on 01/28/2015) 15  tablet 0  . potassium chloride SA (K-DUR,KLOR-CON) 20 MEQ tablet Take 1 tablet (20 mEq total) by mouth 2 (two) times daily. (Patient not taking: Reported on 12/04/2014) 60 tablet 0   No current facility-administered medications for this visit.    Facility-Administered Medications Ordered in Other Visits  Medication Dose Route Frequency Provider Last Rate Last Dose  . 0.9 %  sodium chloride infusion   Intravenous Once Heath Lark, MD      . bleomycin (BLEOCIN) 16 Units in sodium chloride 0.9 % 50 mL chemo infusion  10 Units/m2 (Treatment Plan Actual) Intravenous Once Heath Lark, MD      . dacarbazine (DTIC) 580 mg in sodium chloride 0.9 % 250 mL chemo infusion  375 mg/m2 (Treatment Plan Actual) Intravenous Once Heath Lark, MD      . DOXOrubicin (ADRIAMYCIN) chemo injection 38 mg  25 mg/m2 (Treatment Plan Actual) Intravenous Once Heath Lark, MD      . heparin lock flush 100 unit/mL  500 Units Intracatheter Once PRN Heath Lark, MD      . leuprolide (LUPRON) injection 7.5 mg  7.5 mg Intramuscular Q28 days Heath Lark, MD      . ondansetron (ZOFRAN) 16 mg, dexamethasone (DECADRON) 20 mg in sodium chloride 0.9 % 50 mL IVPB   Intravenous Once Heath Lark, MD      . sodium chloride 0.9 % injection 10 mL  10 mL Intracatheter PRN Amylynn Fano, MD      . vinBLAStine (VELBAN) 9.3 mg in sodium chloride 0.9 % 50 mL chemo infusion  6 mg/m2 (Treatment Plan Actual) Intravenous Once Heath Lark, MD        PHYSICAL EXAMINATION: ECOG PERFORMANCE STATUS: 0 - Asymptomatic  Filed Vitals:   01/28/15 1100  BP: 125/74  Pulse: 59  Temp: 97.4 F (36.3 C)  Resp: 20   Filed Weights   01/28/15 1100  Weight: 104 lb 1.6 oz (47.219 kg)    GENERAL:alert, no distress and comfortable SKIN: skin color, texture, turgor are normal, no rashes or significant lesions EYES: normal, Conjunctiva are pink and non-injected, sclera clear OROPHARYNX:no exudate, no erythema and lips, buccal mucosa, and tongue normal  NECK: supple, thyroid normal size, non-tender, without nodularity LYMPH:  She has persistent lymphadenopathy in the axilla and cervical region.  LUNGS: clear to auscultation and percussion with normal breathing effort HEART: regular rate & rhythm and no murmurs  and no lower extremity edema ABDOMEN:abdomen soft, non-tender and normal bowel sounds Musculoskeletal:no cyanosis of digits and no clubbing  NEURO: alert & oriented x 3 with fluent speech, no focal motor/sensory deficits  LABORATORY DATA:  I have reviewed the data as listed    Component Value Date/Time   NA 139 01/28/2015 1040   NA 138 01/09/2015 2027   K 4.0 01/28/2015 1040   K 3.9 01/09/2015 2027   CL 104 01/09/2015 2027   CO2 26 01/28/2015 1040   CO2 24 01/09/2015 2027   GLUCOSE 86 01/28/2015 1040   GLUCOSE 120* 01/09/2015 2027   BUN 7.4 01/28/2015 1040   BUN 11 01/09/2015 2027   CREATININE 0.7 01/28/2015 1040   CREATININE 0.68 01/09/2015 2027   CREATININE 0.66 12/04/2014 1014   CALCIUM 10.0 01/28/2015 1040   CALCIUM 9.4 01/09/2015 2027   PROT 7.9 01/28/2015 1040   PROT 8.5* 01/09/2015 2027   ALBUMIN 3.8 01/28/2015 1040   ALBUMIN 4.3 01/09/2015 2027   AST 16 01/28/2015 1040   AST 15 01/09/2015 2027   ALT 11 01/28/2015 1040  ALT 11* 01/09/2015 2027   ALKPHOS 90 01/28/2015 1040   ALKPHOS 82 01/09/2015 2027   BILITOT 2.03* 01/28/2015 1040   BILITOT 0.7 01/09/2015 2027   GFRNONAA >60 01/09/2015 2027   GFRNONAA >89 12/04/2014 1014   GFRAA >60 01/09/2015 2027   GFRAA >89 12/04/2014 1014    No results found for: SPEP, UPEP  Lab Results  Component Value Date   WBC 5.7 01/28/2015   NEUTROABS 3.7 01/28/2015   HGB 14.5 01/28/2015   HCT 42.2 01/28/2015   MCV 91.7 01/28/2015   PLT 280 01/28/2015      Chemistry      Component Value Date/Time   NA 139 01/28/2015 1040   NA 138 01/09/2015 2027   K 4.0 01/28/2015 1040   K 3.9 01/09/2015 2027   CL 104 01/09/2015 2027   CO2 26 01/28/2015 1040   CO2 24 01/09/2015 2027   BUN 7.4 01/28/2015 1040   BUN 11 01/09/2015 2027   CREATININE 0.7 01/28/2015 1040   CREATININE 0.68 01/09/2015 2027   CREATININE 0.66 12/04/2014 1014      Component Value Date/Time   CALCIUM 10.0 01/28/2015 1040   CALCIUM 9.4 01/09/2015 2027    ALKPHOS 90 01/28/2015 1040   ALKPHOS 82 01/09/2015 2027   AST 16 01/28/2015 1040   AST 15 01/09/2015 2027   ALT 11 01/28/2015 1040   ALT 11* 01/09/2015 2027   BILITOT 2.03* 01/28/2015 1040   BILITOT 0.7 01/09/2015 2027       RADIOGRAPHIC STUDIES: I reviewed the PET scan with her I have personally reviewed the radiological images as listed and agreed with the findings in the report.   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 She was actually discharged from my practice but begged me to accept her back. She has agreed to be compliant in the future. I also got verbal confirmation from her mother who would also ensure that the patient will keep all appointments in the future. PET CT scan show residual disease. She is interested to resume treatment. We will continue Lupron injection every 4 weeks to protect her future fertility. I will see her in 2 weeks to assess for side effects of treatment.  Human immunodeficiency virus (HIV) disease She continues taking anti-retroviral treatment. Again, I reinforced the importance of her keeping her appointments with infectious disease. She stated that she will be compliant taking her anti-retroviral treatment in the future    Abnormal liver function tests Her function tests has thankfully recovered fully. I will increase the dose of the chemotherapy to full dose.     H/O noncompliance with medical treatment, presenting hazards to health The patient has significant medical noncompliance and had missed many appointments and rescheduled many appointments in the past. She was discharged from my service but recently she repented and promised that she will be compliant in the future.  I also got verbal confirmation from the patient's mother that she will also make sure that the patient keeps all her  appointment in the future     No orders of the defined types were placed in this encounter.   All questions were answered. The patient knows to call the clinic with any problems, questions or concerns. No barriers to learning was detected. I spent 25 minutes counseling the patient face to face. The total time spent in the appointment was  40 minutes and more than 50% was on counseling and review of test results     [facility], Jayline Kilburg, MD 01/28/2015 12:58 PM

## 2015-02-01 ENCOUNTER — Telehealth: Payer: Self-pay | Admitting: *Deleted

## 2015-02-01 NOTE — Telephone Encounter (Signed)
Patient called wanting to inform MD Alvy Bimler that she is experiencing tingling on her tongue since yesterday, but no other complaints. Message sent to MD Alvy Bimler

## 2015-02-01 NOTE — Telephone Encounter (Signed)
Could be transient effect from treatment. Observe only

## 2015-02-01 NOTE — Telephone Encounter (Signed)
Pt notified of message below. Instructed to call if worse or notices sores.

## 2015-02-04 ENCOUNTER — Telehealth: Payer: Self-pay | Admitting: *Deleted

## 2015-02-04 NOTE — Telephone Encounter (Signed)
VOICEMAIL:  "Can someone please call back." [name].  " I need a nurse, I'm catching a cold and need to know what I can take for a cold.  Woke up this morning sneezing,runny nose with cough."  Denies fever, seasonal allergies.  Does have thermometer.  Advised she monitor temperature and call for temp >100.5.  Force fluids with especially more water and try OTC medicines to treat symptoms like Robitussin DM.  Asked if she can take Theraflu.  ABVD received 01-28-2015 with next F/U 02-11-2015.

## 2015-02-06 NOTE — Telephone Encounter (Signed)
Received vm message from pt stating she cannot sleep, her stomach is upset and she continues to have cold -like symptoms.  She states she is sneezing, coughing-secretions yellowish in color. Denies fever (tempt 98.6), body ache or chills.  She is taking Theraflu OTC but states she does not feel that much better. Pt requesting something for her vague stomach symptoms (upset stomach-no nausea or vomiting or diarrhea), insomnia and cold symptoms. Please advise.

## 2015-02-06 NOTE — Telephone Encounter (Signed)
Called [facility].  Reviewed Dr. Alvy Bimler instructions, answered questions about why TUMS.  No further questions.

## 2015-02-06 NOTE — Telephone Encounter (Signed)
Because the patient has HIV disease, I would recommend the patient to call ID office for recommendations regarding infections related questions For vague stomach discomfort, that could be a side-effect of chemo or a form of nausea or gastritis I recommend her to try TUMS and if that is not helpful to try anti-emetics as prescribed

## 2015-02-07 ENCOUNTER — Emergency Department (HOSPITAL_COMMUNITY): Payer: Medicaid Other

## 2015-02-07 ENCOUNTER — Emergency Department (HOSPITAL_COMMUNITY)
Admission: EM | Admit: 2015-02-07 | Discharge: 2015-02-07 | Disposition: A | Discharge status: Home / Self Care | Payer: Medicaid Other | Attending: Emergency Medicine | Admitting: Emergency Medicine

## 2015-02-07 ENCOUNTER — Encounter (HOSPITAL_COMMUNITY): Payer: Self-pay | Admitting: *Deleted

## 2015-02-07 ENCOUNTER — Telehealth: Payer: Self-pay | Admitting: *Deleted

## 2015-02-07 ENCOUNTER — Emergency Department (HOSPITAL_COMMUNITY)
Admission: EM | Admit: 2015-02-07 | Discharge: 2015-02-07 | Disposition: A | Discharge status: Home / Self Care | Payer: Medicaid Other | Source: Home / Self Care | Attending: Emergency Medicine | Admitting: Emergency Medicine

## 2015-02-07 ENCOUNTER — Encounter (HOSPITAL_COMMUNITY): Payer: Self-pay | Admitting: Emergency Medicine

## 2015-02-07 DIAGNOSIS — Z720 Tobacco use: Secondary | ICD-10-CM | POA: Diagnosis not present

## 2015-02-07 DIAGNOSIS — Z8709 Personal history of other diseases of the respiratory system: Secondary | ICD-10-CM | POA: Diagnosis not present

## 2015-02-07 DIAGNOSIS — R1013 Epigastric pain: Secondary | ICD-10-CM | POA: Diagnosis not present

## 2015-02-07 DIAGNOSIS — R1110 Vomiting, unspecified: Secondary | ICD-10-CM | POA: Diagnosis not present

## 2015-02-07 DIAGNOSIS — C8190 Hodgkin lymphoma, unspecified, unspecified site: Secondary | ICD-10-CM | POA: Insufficient documentation

## 2015-02-07 DIAGNOSIS — Z79899 Other long term (current) drug therapy: Secondary | ICD-10-CM | POA: Diagnosis not present

## 2015-02-07 DIAGNOSIS — F329 Major depressive disorder, single episode, unspecified: Secondary | ICD-10-CM | POA: Diagnosis not present

## 2015-02-07 DIAGNOSIS — G43A0 Cyclical vomiting, not intractable: Secondary | ICD-10-CM | POA: Diagnosis not present

## 2015-02-07 DIAGNOSIS — Z8719 Personal history of other diseases of the digestive system: Secondary | ICD-10-CM | POA: Diagnosis not present

## 2015-02-07 DIAGNOSIS — E876 Hypokalemia: Secondary | ICD-10-CM | POA: Insufficient documentation

## 2015-02-07 DIAGNOSIS — D709 Neutropenia, unspecified: Secondary | ICD-10-CM

## 2015-02-07 DIAGNOSIS — D649 Anemia, unspecified: Secondary | ICD-10-CM | POA: Diagnosis not present

## 2015-02-07 DIAGNOSIS — D72819 Decreased white blood cell count, unspecified: Secondary | ICD-10-CM | POA: Insufficient documentation

## 2015-02-07 DIAGNOSIS — B20 Human immunodeficiency virus [HIV] disease: Secondary | ICD-10-CM | POA: Diagnosis not present

## 2015-02-07 DIAGNOSIS — Z85048 Personal history of other malignant neoplasm of rectum, rectosigmoid junction, and anus: Secondary | ICD-10-CM | POA: Insufficient documentation

## 2015-02-07 DIAGNOSIS — R1084 Generalized abdominal pain: Secondary | ICD-10-CM

## 2015-02-07 DIAGNOSIS — E43 Unspecified severe protein-calorie malnutrition: Secondary | ICD-10-CM | POA: Diagnosis not present

## 2015-02-07 DIAGNOSIS — G43A1 Cyclical vomiting, intractable: Secondary | ICD-10-CM | POA: Diagnosis not present

## 2015-02-07 DIAGNOSIS — K529 Noninfective gastroenteritis and colitis, unspecified: Secondary | ICD-10-CM

## 2015-02-07 DIAGNOSIS — R636 Underweight: Secondary | ICD-10-CM | POA: Diagnosis not present

## 2015-02-07 DIAGNOSIS — R112 Nausea with vomiting, unspecified: Secondary | ICD-10-CM | POA: Diagnosis present

## 2015-02-07 LAB — URINALYSIS, ROUTINE W REFLEX MICROSCOPIC
Bilirubin Urine: NEGATIVE
GLUCOSE, UA: NEGATIVE mg/dL
HGB URINE DIPSTICK: NEGATIVE
Ketones, ur: 15 mg/dL — AB
LEUKOCYTES UA: NEGATIVE
Nitrite: NEGATIVE
PH: 5.5 (ref 5.0–8.0)
Protein, ur: 30 mg/dL — AB
SPECIFIC GRAVITY, URINE: 1.037 — AB (ref 1.005–1.030)
Urobilinogen, UA: 0.2 mg/dL (ref 0.0–1.0)

## 2015-02-07 LAB — CBC WITH DIFFERENTIAL/PLATELET
BASOS ABS: 0 10*3/uL (ref 0.0–0.1)
Basophils Relative: 1 %
EOS ABS: 0 10*3/uL (ref 0.0–0.7)
Eosinophils Relative: 1 %
HEMATOCRIT: 41 % (ref 36.0–46.0)
HEMOGLOBIN: 14.5 g/dL (ref 12.0–15.0)
LYMPHS ABS: 1 10*3/uL (ref 0.7–4.0)
LYMPHS PCT: 60 %
MCH: 31.5 pg (ref 26.0–34.0)
MCHC: 35.4 g/dL (ref 30.0–36.0)
MCV: 89.1 fL (ref 78.0–100.0)
MONOS PCT: 1 %
Monocytes Absolute: 0 10*3/uL — ABNORMAL LOW (ref 0.1–1.0)
NEUTROS ABS: 0.6 10*3/uL — AB (ref 1.7–7.7)
Neutrophils Relative %: 37 %
Platelets: 260 10*3/uL (ref 150–400)
RBC: 4.6 MIL/uL (ref 3.87–5.11)
RDW: 13.1 % (ref 11.5–15.5)
WBC: 1.6 10*3/uL — AB (ref 4.0–10.5)

## 2015-02-07 LAB — BASIC METABOLIC PANEL
Anion gap: 11 (ref 5–15)
BUN: 13 mg/dL (ref 6–20)
CHLORIDE: 99 mmol/L — AB (ref 101–111)
CO2: 25 mmol/L (ref 22–32)
CREATININE: 0.55 mg/dL (ref 0.44–1.00)
Calcium: 9.6 mg/dL (ref 8.9–10.3)
GFR calc Af Amer: >60 mL/min (ref >60)
GFR calc non Af Amer: >60 mL/min (ref >60)
GLUCOSE: 106 mg/dL — AB (ref 65–99)
POTASSIUM: 3.8 mmol/L (ref 3.5–5.1)
SODIUM: 135 mmol/L (ref 135–145)

## 2015-02-07 LAB — I-STAT BETA HCG BLOOD, ED (MC, WL, AP ONLY): I-stat hCG, quantitative: <5 m[IU]/mL (ref <5)

## 2015-02-07 LAB — I-STAT CG4 LACTIC ACID, ED: LACTIC ACID, VENOUS: 0.64 mmol/L (ref 0.5–2.0)

## 2015-02-07 LAB — URINE MICROSCOPIC-ADD ON

## 2015-02-07 MED ORDER — CIPROFLOXACIN HCL 500 MG PO TABS
500.0000 mg | ORAL_TABLET | Freq: Once | ORAL | Status: AC
  Administered 2015-02-07: 500 mg via ORAL
  Filled 2015-02-07: qty 1

## 2015-02-07 MED ORDER — OXYCODONE-ACETAMINOPHEN 5-325 MG PO TABS
1.0000 | ORAL_TABLET | Freq: Once | ORAL | Status: AC
  Administered 2015-02-07: 1 via ORAL
  Filled 2015-02-07: qty 1

## 2015-02-07 MED ORDER — MORPHINE SULFATE (PF) 4 MG/ML IV SOLN
4.0000 mg | Freq: Once | INTRAVENOUS | Status: AC
  Administered 2015-02-07: 4 mg via INTRAVENOUS
  Filled 2015-02-07: qty 1

## 2015-02-07 MED ORDER — METRONIDAZOLE 500 MG PO TABS: 500.0000 mg | ORAL_TABLET | Freq: Two times a day (BID) | ORAL | Status: DC

## 2015-02-07 MED ORDER — SODIUM CHLORIDE 0.9 % IV BOLUS (SEPSIS)
1000.0000 mL | Freq: Once | INTRAVENOUS | Status: AC
  Administered 2015-02-07: 1000 mL via INTRAVENOUS

## 2015-02-07 MED ORDER — ONDANSETRON 8 MG PO TBDP
ORAL_TABLET | ORAL | Status: AC
  Filled 2015-02-07: qty 1

## 2015-02-07 MED ORDER — METRONIDAZOLE IN NACL 5-0.79 MG/ML-% IV SOLN
500.0000 mg | Freq: Once | INTRAVENOUS | Status: AC
Start: 2015-02-07 — End: 2015-02-07
  Administered 2015-02-07: 500 mg via INTRAVENOUS
  Filled 2015-02-07: qty 100

## 2015-02-07 MED ORDER — CIPROFLOXACIN HCL 500 MG PO TABS: 500.0000 mg | ORAL_TABLET | Freq: Two times a day (BID) | ORAL | Status: DC

## 2015-02-07 MED ORDER — HYDROCODONE-ACETAMINOPHEN 5-325 MG PO TABS: 1.0000 | ORAL_TABLET | Freq: Four times a day (QID) | ORAL | Status: DC | PRN

## 2015-02-07 MED ORDER — SODIUM CHLORIDE 0.9 % IV BOLUS (SEPSIS)
2000.0000 mL | Freq: Once | INTRAVENOUS | Status: AC
  Administered 2015-02-07: 2000 mL via INTRAVENOUS

## 2015-02-07 MED ORDER — ONDANSETRON 8 MG PO TBDP
8.0000 mg | ORAL_TABLET | Freq: Once | ORAL | Status: AC
  Administered 2015-02-07: 8 mg via ORAL

## 2015-02-07 MED ORDER — ONDANSETRON 8 MG PO TBDP: 8.0000 mg | ORAL_TABLET | Freq: Three times a day (TID) | ORAL | Status: DC | PRN

## 2015-02-07 MED ORDER — CIPROFLOXACIN IN D5W 400 MG/200ML IV SOLN
400.0000 mg | Freq: Once | INTRAVENOUS | Status: DC
  Filled 2015-02-07: qty 200

## 2015-02-07 MED ORDER — ONDANSETRON HCL 4 MG/2ML IJ SOLN: 4.0000 mg | Freq: Once | INTRAMUSCULAR | Status: DC

## 2015-02-07 MED ORDER — ONDANSETRON HCL 4 MG/2ML IJ SOLN
4.0000 mg | Freq: Once | INTRAMUSCULAR | Status: AC
Start: 2015-02-07 — End: 2015-02-07
  Administered 2015-02-07: 4 mg via INTRAVENOUS
  Filled 2015-02-07: qty 2

## 2015-02-07 NOTE — ED Provider Notes (Signed)
CSN: [id number]     Arrival date & time 02/07/15  1923 History   First MD Initiated Contact with Patient 02/07/15 2023     Chief Complaint  Patient presents with  . Gastritis   . Abdominal Pain     (Consider location/radiation/quality/duration/timing/severity/associated sxs/prior Treatment) HPI [name] is a 29 y.o. female with hx of HIV, Hodgkin's lymphoma, currently on chemotherapy. Presents emergency department complaining of abdominal pain, nausea, vomiting. Patient states that her pain started several days ago. She was seen this morning for the same, discharged home with Cipro and Flagyl. Patient had similar symptoms one month ago and had a CT scan that showed colitis. Patient states this feels exactly the same. She denies any fever or chills patient states however since discharge hasn't been able to keep anything down including her antibiotics. She is taking ibuprofen but unable to keep that down as well. She has no nausea medications at home. She denies history of colitis in the past and has never seen a gastroenterologist. She denies any diarrhea. No blood in her stool or emesis. Denies any urinary symptoms or back pain.  Past Medical History  Diagnosis Date  . HIV (human immunodeficiency virus infection) ([place])   . Depression   . Anemia   . HSV (herpes simplex virus) infection   . History of shingles   . AIN III (anal intraepithelial neoplasia III)   . Condyloma acuminatum in female   . History of chronic bronchitis   . History of esophagitis     CANDIDA  . Periodontitis, chronic   . Hodgkin's lymphoma ([place]) 06/12/2014  . Cancer ([place])     Hodgkin lymphoma  . Hypokalemia 07/17/2014   Past Surgical History  Procedure Laterality Date  . Dilation and curettage of uterus  2005    MISSED AB  . Examination under anesthesia N/A 09/23/2012    Procedure: EXAM UNDER ANESTHESIA;  Surgeon: Adin Hector, MD;  Location: [facility];  Service: General;  Laterality:  N/A;  . Laser ablation condolamata N/A 09/23/2012    Procedure: REMOVAL/ABLATION  ABLATION CONDOLAMATA WARTS;  Surgeon: Adin Hector, MD;  Location: [facility];  Service: General;  Laterality: N/A;   Family History  Problem Relation Age of Onset  . Cancer Maternal Aunt     unknown ca  . Cancer Maternal Grandmother     unknown ca   Social History  Substance Use Topics  . Smoking status: Current Every Day Smoker -- 0.00 packs/day for 7 years    Types: Cigars    Start date: 03/19/2014  . Smokeless tobacco: Never Used     Comment: she smokes 3 Black and Mild Cigars daily  . Alcohol Use: No   OB History    Gravida Para Term Preterm AB TAB SAB Ectopic Multiple Living   4 0 0  2  1 1   0     Review of Systems  Constitutional: Negative for fever and chills.  Respiratory: Negative for cough, chest tightness and shortness of breath.   Cardiovascular: Negative for chest pain, palpitations and leg swelling.  Gastrointestinal: Positive for nausea, vomiting and abdominal pain. Negative for diarrhea and blood in stool.  Genitourinary: Negative for dysuria, flank pain and pelvic pain.  Musculoskeletal: Negative for myalgias, arthralgias, neck pain and neck stiffness.  Skin: Negative for rash.  Neurological: Negative for dizziness, weakness and headaches.  All other systems reviewed and are negative.     Allergies  Review of  patient's allergies indicates no known allergies.  Home Medications   Prior to Admission medications   Medication Sig Start Date End Date Taking? Authorizing Provider  atazanavir (REYATAZ) 300 MG capsule Take 1 capsule (300 mg total) by mouth daily with breakfast. TAKE WITH NORVIR 09/12/14  Yes Truman Hayward, MD  buPROPion (WELLBUTRIN XL) 150 MG 24 hr tablet Take 150 mg by mouth at bedtime.    Yes Historical Provider, MD  ciprofloxacin (CIPRO) 500 MG tablet Take 1 tablet (500 mg total) by mouth 2 (two) times daily. 02/07/15  Yes Varney Biles, MD  emtricitabine-tenofovir (TRUVADA) 200-300 MG per tablet Take 1 tablet by mouth daily. 09/12/14  Yes Truman Hayward, MD  ibuprofen (ADVIL,MOTRIN) 200 MG tablet Take 400 mg by mouth every 6 (six) hours as needed for moderate pain.   Yes Historical Provider, MD  ketoconazole (NIZORAL) 2 % cream Apply 1 application topically 2 (two) times daily as needed for irritation. 12/25/14  Yes Thayer Headings, MD  lidocaine-prilocaine (EMLA) cream Apply to affected area once 06/26/14  Yes Heath Lark, MD  metroNIDAZOLE (FLAGYL) 500 MG tablet Take 1 tablet (500 mg total) by mouth 2 (two) times daily. 02/07/15  Yes Varney Biles, MD  Naphazoline HCl (CLEAR EYES OP) Apply 2 drops to eye 2 (two) times daily.   Yes Historical Provider, MD  ondansetron (ZOFRAN) 4 MG tablet Take 1 tablet (4 mg total) by mouth every 6 (six) hours. 01/10/15  Yes Harvel Quale, MD  ritonavir (NORVIR) 100 MG TABS tablet Take 1 tablet (100 mg total) by mouth daily with breakfast. TAKE WITH REYATAZ 09/12/14  Yes Truman Hayward, MD  traZODone (DESYREL) 50 MG tablet Take 50 mg by mouth at bedtime.   Yes Historical Provider, MD  hydrocortisone cream 0.5 % Apply 1 application topically 2 (two) times daily. Patient not taking: Reported on 02/07/2015 12/19/14   Thayer Headings, MD  potassium chloride SA (K-DUR,KLOR-CON) 20 MEQ tablet Take 1 tablet (20 mEq total) by mouth 2 (two) times daily. Patient not taking: Reported on 12/04/2014 07/17/14   Heath Lark, MD   BP 121/84 mmHg  Pulse 70  Temp(Src) 99.1 F (37.3 C) (Oral)  Resp 15  SpO2 100%  LMP 12/17/2014 Physical Exam  Constitutional: She is oriented to person, place, and time. She appears well-developed and well-nourished. No distress.  HENT:  Head: Normocephalic.  Eyes: Conjunctivae are normal.  Neck: Neck supple.  Cardiovascular: Normal rate, regular rhythm and normal heart sounds.   Pulmonary/Chest: Effort normal and breath sounds normal. No respiratory distress. She  has no wheezes. She has no rales.  Abdominal: Soft. Bowel sounds are normal. She exhibits no distension. There is tenderness. There is no rebound.  Diffuse tenderness  Musculoskeletal: She exhibits no edema.  Neurological: She is alert and oriented to person, place, and time.  Skin: Skin is warm and dry.  Psychiatric: She has a normal mood and affect. Her behavior is normal.  Nursing note and vitals reviewed.   ED Course  Procedures (including critical care time) Labs Review Labs Reviewed - No data to display  Imaging Review Dg Chest 2 View  02/07/2015  CLINICAL DATA:  Upper abdominal pain radiating to the back. Vomiting for 24 hours. Cough. Patient is having chemotherapy for Hodgkin's lymphoma. EXAM: CHEST  2 VIEW COMPARISON:  08/10/2014 FINDINGS: Power port type central venous catheter with tip over the low SVC region. No pneumothorax. Normal heart size and pulmonary vascularity. No  focal airspace disease or consolidation in the lungs. No blunting of costophrenic angles. No pneumothorax. Mediastinal contours appear intact. IMPRESSION: No active cardiopulmonary disease. Electronically Signed   By: Lucienne Capers M.D.   On: 02/07/2015 05:01   I have personally reviewed and evaluated these images and lab results as part of my medical decision-making.   EKG Interpretation None      MDM   Final diagnoses:  Generalized abdominal pain  Non-intractable vomiting with nausea, vomiting of unspecified type  Neutropenia, unspecified type ([place])    patient was just seen this morning for abdominal pain. History of similar pain one month ago, diagnosed with colitis by CT scan. She had that time was treated with antibiotics which she admits she did not finish. Pain feels exactly the same. She denies any fever or chills. Should Mr. nausea vomiting, unable to keep any down. She is back to emergency department because she has persistent nausea and vomiting. She was not prescribed any antibiotics  earlier today. She is immunocompromised, currently being treated for HIV and Hodgkin's lymphoma and on chemotherapy with last chemotherapy 3 days ago. Her white blood cell count this morning was 1.3. I offered patient admission to the hospital given she is unable to keep antibiotics down and she is immunocompromised with leukopenia. She however refuses. She has normal vital signs here. She is not tachycardic. Blood pressure is normal. She is afebrile. Patient is just asking for antibiotics and pain medications until she is able to follow up. I have offered her admission several times. She refused each time. I have given her Zofran ODT in the department, she was able to keep down Percocet and oral fluids afterwards. She continues to request being discharged home. She agrees to return if her symptoms are getting worse.  Home with zofran, norco, follow up.   Filed Vitals:   02/07/15 1927 02/07/15 2150  BP: 121/84 130/88  Pulse: 70 87  Temp: 99.1 F (37.3 C)   TempSrc: Oral   Resp: 15 17  SpO2: 100% 100%     Jeannett Senior, PA-C 02/08/15 0024  Charlesetta Shanks, MD 02/10/15 0104

## 2015-02-07 NOTE — ED Provider Notes (Signed)
CSN: [id number]     Arrival date & time 02/07/15  0031 History  By signing my name below, I, Hilda Lias, attest that this documentation has been prepared under the direction and in the presence of Varney Biles, MD. Electronically Signed: Hilda Lias, ED Scribe. 02/07/2015. 2:40 AM.    Chief Complaint  Patient presents with  . Abdominal Pain      Patient is a 29 y.o. female presenting with abdominal pain. The history is provided by the patient. No language interpreter was used.  Abdominal Pain Associated symptoms: nausea and vomiting   Associated symptoms: no diarrhea, no dysuria, no fever, no hematuria, no vaginal bleeding and no vaginal discharge    HPI Comments: [name] is a 29 y.o. female with HIV and Hodgkin's lymphoma who presents to the Emergency Department complaining of constant, throbbing, worsening generalized abdominal pain with associated vomiting and nausea that has been present since yesterday morning. Pt states that her pain radiates from her abdomen to her back. Pt states she has vomited 6x today and reports that her vomit consists of whatever she has eaten. Pt states she has not had a bowel movement in 2-3 days. Pt states she has been pregnant before 4x and had 4 ectopic pregnancies with no live births. Pt denies being on birth control, and states her last period was on August 20th. Pt states her Hodgkins lymphoma was diagnosed in April of this year, and is currently receive chemotherapy for it every two weeks. Her last chemo session was three days ago. Pt denies fever, diarrhea, or any urinary symptoms.    Past Medical History  Diagnosis Date  . HIV (human immunodeficiency virus infection) ([place])   . Depression   . Anemia   . HSV (herpes simplex virus) infection   . History of shingles   . AIN III (anal intraepithelial neoplasia III)   . Condyloma acuminatum in female   . History of chronic bronchitis   . History of esophagitis     CANDIDA  .  Periodontitis, chronic   . Hodgkin's lymphoma ([place]) 06/12/2014  . Cancer ([place])     Hodgkin lymphoma  . Hypokalemia 07/17/2014   Past Surgical History  Procedure Laterality Date  . Dilation and curettage of uterus  2005    MISSED AB  . Examination under anesthesia N/A 09/23/2012    Procedure: EXAM UNDER ANESTHESIA;  Surgeon: Adin Hector, MD;  Location: [facility];  Service: General;  Laterality: N/A;  . Laser ablation condolamata N/A 09/23/2012    Procedure: REMOVAL/ABLATION  ABLATION CONDOLAMATA WARTS;  Surgeon: Adin Hector, MD;  Location: [facility];  Service: General;  Laterality: N/A;   Family History  Problem Relation Age of Onset  . Cancer Maternal Aunt     unknown ca  . Cancer Maternal Grandmother     unknown ca   Social History  Substance Use Topics  . Smoking status: Current Every Day Smoker -- 0.00 packs/day for 7 years    Types: Cigars    Start date: 03/19/2014  . Smokeless tobacco: Never Used     Comment: she smokes 3 Black and Mild Cigars daily  . Alcohol Use: No   OB History    Gravida Para Term Preterm AB TAB SAB Ectopic Multiple Living   4 0 0  2  1 1   0     Review of Systems  Constitutional: Negative for fever.  Gastrointestinal: Positive for nausea, vomiting and abdominal pain.  Negative for diarrhea.  Genitourinary: Negative for dysuria, urgency, hematuria, vaginal bleeding, vaginal discharge and menstrual problem.  All other systems reviewed and are negative.     Allergies  Review of patient's allergies indicates no known allergies.  Home Medications   Prior to Admission medications   Medication Sig Start Date End Date Taking? Authorizing Provider  atazanavir (REYATAZ) 300 MG capsule Take 1 capsule (300 mg total) by mouth daily with breakfast. TAKE WITH NORVIR 09/12/14  Yes Truman Hayward, MD  buPROPion (WELLBUTRIN XL) 150 MG 24 hr tablet Take 150 mg by mouth at bedtime.    Yes Historical Provider, MD   emtricitabine-tenofovir (TRUVADA) 200-300 MG per tablet Take 1 tablet by mouth daily. 09/12/14  Yes Truman Hayward, MD  ibuprofen (ADVIL,MOTRIN) 200 MG tablet Take 400 mg by mouth every 6 (six) hours as needed for moderate pain.   Yes Historical Provider, MD  ketoconazole (NIZORAL) 2 % cream Apply 1 application topically 2 (two) times daily as needed for irritation. 12/25/14  Yes Thayer Headings, MD  Naphazoline HCl (CLEAR EYES OP) Apply 2 drops to eye 2 (two) times daily.   Yes Historical Provider, MD  ondansetron (ZOFRAN) 4 MG tablet Take 1 tablet (4 mg total) by mouth every 6 (six) hours. 01/10/15  Yes Harvel Quale, MD  OVER THE COUNTER MEDICATION Take 1 packet by mouth every 4 (four) hours as needed (cold symptoms (Thera-Flu)).   Yes Historical Provider, MD  ritonavir (NORVIR) 100 MG TABS tablet Take 1 tablet (100 mg total) by mouth daily with breakfast. TAKE WITH REYATAZ 09/12/14  Yes Truman Hayward, MD  traZODone (DESYREL) 50 MG tablet Take 50 mg by mouth at bedtime.   Yes Historical Provider, MD  ciprofloxacin (CIPRO) 500 MG tablet Take 1 tablet (500 mg total) by mouth 2 (two) times daily. 02/07/15   Varney Biles, MD  hydrocortisone cream 0.5 % Apply 1 application topically 2 (two) times daily. Patient not taking: Reported on 02/07/2015 12/19/14   Thayer Headings, MD  lidocaine-prilocaine (EMLA) cream Apply to affected area once 06/26/14   Heath Lark, MD  metroNIDAZOLE (FLAGYL) 500 MG tablet Take 1 tablet (500 mg total) by mouth 2 (two) times daily. 02/07/15   Varney Biles, MD  potassium chloride SA (K-DUR,KLOR-CON) 20 MEQ tablet Take 1 tablet (20 mEq total) by mouth 2 (two) times daily. Patient not taking: Reported on 12/04/2014 07/17/14   Heath Lark, MD   BP 116/69 mmHg  Pulse 83  Temp(Src) 98.9 F (37.2 C) (Temporal)  Resp 17  Ht 5\' 4"  (1.626 m)  Wt 107 lb (48.535 kg)  BMI 18.36 kg/m2  SpO2 97%  LMP 12/17/2014 Physical Exam  Cardiovascular: Normal rate and regular rhythm.    Abdominal: There is tenderness.  Positive bowel sounds Positive epigastric tenderness with no peritoneal signs     ED Course  Procedures (including critical care time)  DIAGNOSTIC STUDIES: Oxygen Saturation is 98% on room air, normal by my interpretation.    COORDINATION OF CARE: 2:20 AM Discussed treatment plan with pt at bedside and pt agreed to plan.   Labs Review Labs Reviewed  CBC WITH DIFFERENTIAL/PLATELET - Abnormal; Notable for the following:    WBC 1.6 (*)    Neutro Abs 0.6 (*)    Monocytes Absolute 0.0 (*)    All other components within normal limits  BASIC METABOLIC PANEL - Abnormal; Notable for the following:    Chloride 99 (*)  Glucose, Bld 106 (*)    All other components within normal limits  URINALYSIS, ROUTINE W REFLEX MICROSCOPIC (NOT AT [facility]) - Abnormal; Notable for the following:    Color, Urine AMBER (*)    APPearance CLOUDY (*)    Specific Gravity, Urine 1.037 (*)    Ketones, ur 15 (*)    Protein, ur 30 (*)    All other components within normal limits  URINE MICROSCOPIC-ADD ON - Abnormal; Notable for the following:    Squamous Epithelial / LPF MANY (*)    All other components within normal limits  CULTURE, BLOOD (ROUTINE X 2)  CULTURE, BLOOD (ROUTINE X 2)  I-STAT BETA HCG BLOOD, ED ([facility], [facility], [facility] ONLY)  I-STAT CG4 LACTIC ACID, ED    Imaging Review Dg Chest 2 View  02/07/2015  CLINICAL DATA:  Upper abdominal pain radiating to the back. Vomiting for 24 hours. Cough. Patient is having chemotherapy for Hodgkin's lymphoma. EXAM: CHEST  2 VIEW COMPARISON:  08/10/2014 FINDINGS: Power port type central venous catheter with tip over the low SVC region. No pneumothorax. Normal heart size and pulmonary vascularity. No focal airspace disease or consolidation in the lungs. No blunting of costophrenic angles. No pneumothorax. Mediastinal contours appear intact. IMPRESSION: No active cardiopulmonary disease. Electronically Signed   By: Lucienne Capers M.D.   On:  02/07/2015 05:01   I have personally reviewed and evaluated these images and lab results as part of my medical decision-making.   EKG Interpretation None      MDM   Final diagnoses:  Colitis    I personally performed the services described in this documentation, which was scribed in my presence. The recorded information has been reviewed and is accurate.  Pt comes in with cc of abd pain. Same symptoms as last month. CT showed colitis at that time, pt states that she started the antibiotics but didn't finish the course. She has HIV and has chemo on board. No diarrhea here.  PT given meds here, and she is pain free. Willing and happy to go home - and agrees to finish the antibiotic course this time.   Varney Biles, MD 02/07/15 [phone]

## 2015-02-07 NOTE — Discharge Instructions (Signed)
Please take the antibiotics as prescribed. See the Infectious disease doctor in 1 week. Return to the ER if the symptoms get worse.   Colitis Colitis is inflammation of the colon. Colitis may last a short time (acute) or it may last a long time (chronic). CAUSES This condition may be caused by:  Viruses.  Bacteria.  Reactions to medicine.  Certain autoimmune diseases, such as Crohn disease or ulcerative colitis. SYMPTOMS Symptoms of this condition include:  Diarrhea.  Passing bloody or tarry stool.  Pain.  Fever.  Vomiting.  Tiredness (fatigue).  Weight loss.  Bloating.  Sudden increase in abdominal pain.  Having fewer bowel movements than usual. DIAGNOSIS This condition is diagnosed with a stool test or a blood test. You may also have other tests, including X-rays, a CT scan, or a colonoscopy. TREATMENT Treatment may include:  Resting the bowel. This involves not eating or drinking for a period of time.  Fluids that are given through an IV tube.  Medicine for pain and diarrhea.  Antibiotic medicines.  Cortisone medicines.  Surgery. HOME CARE INSTRUCTIONS Eating and Drinking  Follow instructions from your health care provider about eating or drinking restrictions.  Drink enough fluid to keep your urine clear or pale yellow.  Work with a dietitian to determine which foods cause your condition to flare up.  Avoid foods that cause flare-ups.  Eat a well-balanced diet. Medicines  Take over-the-counter and prescription medicines only as told by your health care provider.  If you were prescribed an antibiotic medicine, take it as told by your health care provider. Do not stop taking the antibiotic even if you start to feel better. General Instructions  Keep all follow-up visits as told by your health care provider. This is important. SEEK MEDICAL CARE IF:  Your symptoms do not go away.  You develop new symptoms. SEEK IMMEDIATE MEDICAL CARE  IF:  You have a fever that does not go away with treatment.  You develop chills.  You have extreme weakness, fainting, or dehydration.  You have repeated vomiting.  You develop severe pain in your abdomen.  You pass bloody or tarry stool.   This information is not intended to replace advice given to you by your health care provider. Make sure you discuss any questions you have with your health care provider.   Document Released: 05/21/2004 Document Revised: 01/02/2015 Document Reviewed: 08/06/2014 Elsevier Interactive Patient Education [employer].

## 2015-02-07 NOTE — Telephone Encounter (Signed)
Pt's mother called states pt seen in ED today and was diagnosed w/ Colitis.  She is home now and having a lot of abd pain.  States she did not get any Rx for pain meds in ED, only antibiotics.  Asks if Dr. Alvy Bimler will prescribe pain meds?   Per Dr. Alvy Bimler pt needs to see PCP for pain or ask ED MD who saw her for pain medication.   The colitis not r/t to pt's Lymphoma or treatment.   Informed mother of Dr. Calton Dach response.  She will call ED and/or take pt back to ED for pain.

## 2015-02-07 NOTE — ED Notes (Signed)
Pt states that she began having mid abd pain Monday am; pt c/o N/V; pt reports vomiting x 6 today; pt denies diarrhea; pt states that she was seen 3 weeks for the same thing; pt states that she was diagnosed with ovarian cyst and given antibiotics to take; pt states that she did not take the antibiotics

## 2015-02-07 NOTE — Discharge Instructions (Signed)
Continue antibiotics. Take nausea and pain medications as prescribed. Please follow up with gastroenterology as referred. Return if worsening symptoms.   Colitis Colitis is inflammation of the colon. Colitis may last a short time (acute) or it may last a long time (chronic). CAUSES This condition may be caused by:  Viruses.  Bacteria.  Reactions to medicine.  Certain autoimmune diseases, such as Crohn disease or ulcerative colitis. SYMPTOMS Symptoms of this condition include:  Diarrhea.  Passing bloody or tarry stool.  Pain.  Fever.  Vomiting.  Tiredness (fatigue).  Weight loss.  Bloating.  Sudden increase in abdominal pain.  Having fewer bowel movements than usual. DIAGNOSIS This condition is diagnosed with a stool test or a blood test. You may also have other tests, including X-rays, a CT scan, or a colonoscopy. TREATMENT Treatment may include:  Resting the bowel. This involves not eating or drinking for a period of time.  Fluids that are given through an IV tube.  Medicine for pain and diarrhea.  Antibiotic medicines.  Cortisone medicines.  Surgery. HOME CARE INSTRUCTIONS Eating and Drinking  Follow instructions from your health care provider about eating or drinking restrictions.  Drink enough fluid to keep your urine clear or pale yellow.  Work with a dietitian to determine which foods cause your condition to flare up.  Avoid foods that cause flare-ups.  Eat a well-balanced diet. Medicines  Take over-the-counter and prescription medicines only as told by your health care provider.  If you were prescribed an antibiotic medicine, take it as told by your health care provider. Do not stop taking the antibiotic even if you start to feel better. General Instructions  Keep all follow-up visits as told by your health care provider. This is important. SEEK MEDICAL CARE IF:  Your symptoms do not go away.  You develop new symptoms. SEEK IMMEDIATE  MEDICAL CARE IF:  You have a fever that does not go away with treatment.  You develop chills.  You have extreme weakness, fainting, or dehydration.  You have repeated vomiting.  You develop severe pain in your abdomen.  You pass bloody or tarry stool.   This information is not intended to replace advice given to you by your health care provider. Make sure you discuss any questions you have with your health care provider.   Document Released: 05/21/2004 Document Revised: 01/02/2015 Document Reviewed: 08/06/2014 Elsevier Interactive Patient Education [employer].

## 2015-02-07 NOTE — ED Notes (Signed)
Returned from xray

## 2015-02-07 NOTE — ED Notes (Addendum)
Pt reports being seen here this morning for "colitis." Was sent home with antibiotics but says, "they didn't send me home with no pain medicine. I can't even keep the medicine down." No active vomiting in triage. No other c/c. Has appt at [facility] in the morning for colitis but says, "I can't take the pain until then."

## 2015-02-08 ENCOUNTER — Ambulatory Visit (INDEPENDENT_AMBULATORY_CARE_PROVIDER_SITE_OTHER): Payer: Medicaid Other | Admitting: Family Medicine

## 2015-02-08 ENCOUNTER — Encounter: Payer: Self-pay | Admitting: Family Medicine

## 2015-02-08 ENCOUNTER — Encounter (HOSPITAL_COMMUNITY): Payer: Self-pay

## 2015-02-08 ENCOUNTER — Emergency Department (HOSPITAL_COMMUNITY): Payer: Medicaid Other

## 2015-02-08 ENCOUNTER — Observation Stay (EMERGENCY_DEPARTMENT_HOSPITAL)
Admission: EM | Admit: 2015-02-08 | Discharge: 2015-02-10 | Disposition: A | Discharge status: Home / Self Care | Payer: Medicaid Other | Source: Home / Self Care | Attending: Emergency Medicine | Admitting: Emergency Medicine

## 2015-02-08 VITALS — BP 114/64 | HR 51 | Temp 97.9°F | Ht 64.0 in | Wt 100.5 lb

## 2015-02-08 DIAGNOSIS — R1110 Vomiting, unspecified: Secondary | ICD-10-CM | POA: Insufficient documentation

## 2015-02-08 DIAGNOSIS — R1013 Epigastric pain: Secondary | ICD-10-CM | POA: Diagnosis not present

## 2015-02-08 DIAGNOSIS — K529 Noninfective gastroenteritis and colitis, unspecified: Secondary | ICD-10-CM | POA: Diagnosis not present

## 2015-02-08 DIAGNOSIS — C8174 Other classical Hodgkin lymphoma, lymph nodes of axilla and upper limb: Secondary | ICD-10-CM

## 2015-02-08 DIAGNOSIS — B20 Human immunodeficiency virus [HIV] disease: Secondary | ICD-10-CM | POA: Diagnosis present

## 2015-02-08 DIAGNOSIS — D270 Benign neoplasm of right ovary: Secondary | ICD-10-CM

## 2015-02-08 DIAGNOSIS — R112 Nausea with vomiting, unspecified: Secondary | ICD-10-CM | POA: Diagnosis present

## 2015-02-08 DIAGNOSIS — G43A1 Cyclical vomiting, intractable: Secondary | ICD-10-CM | POA: Diagnosis not present

## 2015-02-08 DIAGNOSIS — E43 Unspecified severe protein-calorie malnutrition: Secondary | ICD-10-CM | POA: Diagnosis not present

## 2015-02-08 DIAGNOSIS — D72819 Decreased white blood cell count, unspecified: Secondary | ICD-10-CM

## 2015-02-08 DIAGNOSIS — R636 Underweight: Secondary | ICD-10-CM | POA: Diagnosis not present

## 2015-02-08 DIAGNOSIS — D702 Other drug-induced agranulocytosis: Secondary | ICD-10-CM | POA: Diagnosis present

## 2015-02-08 HISTORY — DX: Encounter for screening for infections with a predominantly sexual mode of transmission: Z11.3

## 2015-02-08 LAB — CBC WITH DIFFERENTIAL/PLATELET
BASOS ABS: 0 10*3/uL (ref 0.0–0.1)
Basophils Relative: 2 %
Eosinophils Absolute: 0 10*3/uL (ref 0.0–0.7)
Eosinophils Relative: 2 %
HCT: 36.5 % (ref 36.0–46.0)
Hemoglobin: 12.8 g/dL (ref 12.0–15.0)
LYMPHS ABS: 1 10*3/uL (ref 0.7–4.0)
Lymphocytes Relative: 73 %
MCH: 31 pg (ref 26.0–34.0)
MCHC: 35.1 g/dL (ref 30.0–36.0)
MCV: 88.4 fL (ref 78.0–100.0)
MONO ABS: 0.1 10*3/uL (ref 0.1–1.0)
Monocytes Relative: 10 %
Neutro Abs: 0.2 10*3/uL — ABNORMAL LOW (ref 1.7–7.7)
Neutrophils Relative %: 13 %
PLATELETS: 236 10*3/uL (ref 150–400)
RBC: 4.13 MIL/uL (ref 3.87–5.11)
RDW: 12.8 % (ref 11.5–15.5)
WBC: 1.3 10*3/uL — AB (ref 4.0–10.5)

## 2015-02-08 LAB — URINE MICROSCOPIC-ADD ON

## 2015-02-08 LAB — COMPREHENSIVE METABOLIC PANEL
ALBUMIN: 3.9 g/dL (ref 3.5–5.0)
ALT: 22 U/L (ref 14–54)
AST: 25 U/L (ref 15–41)
Alkaline Phosphatase: 66 U/L (ref 38–126)
Anion gap: 12 (ref 5–15)
BUN: 8 mg/dL (ref 6–20)
CHLORIDE: 103 mmol/L (ref 101–111)
CO2: 23 mmol/L (ref 22–32)
CREATININE: 0.51 mg/dL (ref 0.44–1.00)
Calcium: 8.9 mg/dL (ref 8.9–10.3)
Glucose, Bld: 76 mg/dL (ref 65–99)
POTASSIUM: 3.5 mmol/L (ref 3.5–5.1)
Sodium: 138 mmol/L (ref 135–145)
TOTAL PROTEIN: 7.1 g/dL (ref 6.5–8.1)
Total Bilirubin: 0.5 mg/dL (ref 0.3–1.2)

## 2015-02-08 LAB — URINALYSIS, ROUTINE W REFLEX MICROSCOPIC
GLUCOSE, UA: NEGATIVE mg/dL
KETONES UR: 15 mg/dL — AB
LEUKOCYTES UA: NEGATIVE
Nitrite: NEGATIVE
PH: 6 (ref 5.0–8.0)
PROTEIN: NEGATIVE mg/dL
Specific Gravity, Urine: 1.026 (ref 1.005–1.030)
Urobilinogen, UA: 0.2 mg/dL (ref 0.0–1.0)

## 2015-02-08 LAB — I-STAT CG4 LACTIC ACID, ED: Lactic Acid, Venous: 0.65 mmol/L (ref 0.5–2.0)

## 2015-02-08 LAB — RAPID URINE DRUG SCREEN, HOSP PERFORMED
AMPHETAMINES: NOT DETECTED
BARBITURATES: NOT DETECTED
Benzodiazepines: NOT DETECTED
Cocaine: NOT DETECTED
Opiates: POSITIVE — AB
TETRAHYDROCANNABINOL: POSITIVE — AB

## 2015-02-08 LAB — PREGNANCY, URINE: Preg Test, Ur: NEGATIVE

## 2015-02-08 LAB — LIPASE, BLOOD: LIPASE: 16 U/L — AB (ref 22–51)

## 2015-02-08 MED ORDER — CIPROFLOXACIN HCL 500 MG PO TABS
500.0000 mg | ORAL_TABLET | Freq: Two times a day (BID) | ORAL | Status: DC
  Administered 2015-02-08 – 2015-02-10 (×4): 500 mg via ORAL
  Filled 2015-02-08 (×4): qty 1

## 2015-02-08 MED ORDER — ATAZANAVIR SULFATE 150 MG PO CAPS
300.0000 mg | ORAL_CAPSULE | Freq: Every day | ORAL | Status: DC
  Filled 2015-02-08: qty 2

## 2015-02-08 MED ORDER — ONDANSETRON 4 MG PO TBDP: 8.0000 mg | ORAL_TABLET | Freq: Three times a day (TID) | ORAL | Status: DC | PRN

## 2015-02-08 MED ORDER — RITONAVIR 100 MG PO TABS
100.0000 mg | ORAL_TABLET | Freq: Every day | ORAL | Status: DC
  Administered 2015-02-08 – 2015-02-10 (×3): 100 mg via ORAL
  Filled 2015-02-08 (×4): qty 1

## 2015-02-08 MED ORDER — SODIUM CHLORIDE 0.9 % IV BOLUS (SEPSIS)
1000.0000 mL | Freq: Once | INTRAVENOUS | Status: AC
  Administered 2015-02-08: 1000 mL via INTRAVENOUS

## 2015-02-08 MED ORDER — ATAZANAVIR SULFATE 150 MG PO CAPS
300.0000 mg | ORAL_CAPSULE | Freq: Every day | ORAL | Status: DC
  Administered 2015-02-08 – 2015-02-10 (×3): 300 mg via ORAL
  Filled 2015-02-08 (×4): qty 2

## 2015-02-08 MED ORDER — ENSURE ENLIVE PO LIQD
237.0000 mL | Freq: Two times a day (BID) | ORAL | Status: DC
  Administered 2015-02-09 – 2015-02-10 (×2): 237 mL via ORAL

## 2015-02-08 MED ORDER — SODIUM CHLORIDE 0.9 % IV SOLN
3.0000 g | Freq: Once | INTRAVENOUS | Status: AC
  Administered 2015-02-08: 3 g via INTRAVENOUS
  Filled 2015-02-08: qty 3

## 2015-02-08 MED ORDER — MORPHINE SULFATE (PF) 4 MG/ML IV SOLN
4.0000 mg | Freq: Once | INTRAVENOUS | Status: AC
  Administered 2015-02-08: 4 mg via INTRAVENOUS
  Filled 2015-02-08: qty 1

## 2015-02-08 MED ORDER — IOHEXOL 300 MG/ML  SOLN
100.0000 mL | Freq: Once | INTRAMUSCULAR | Status: AC | PRN
  Administered 2015-02-08: 80 mL via INTRAVENOUS

## 2015-02-08 MED ORDER — ONDANSETRON HCL 4 MG PO TABS: 4.0000 mg | ORAL_TABLET | Freq: Four times a day (QID) | ORAL | Status: DC | PRN

## 2015-02-08 MED ORDER — ONDANSETRON HCL 4 MG/2ML IJ SOLN
4.0000 mg | Freq: Four times a day (QID) | INTRAMUSCULAR | Status: DC | PRN
  Administered 2015-02-09 (×3): 4 mg via INTRAVENOUS
  Filled 2015-02-08 (×3): qty 2

## 2015-02-08 MED ORDER — IBUPROFEN 200 MG PO TABS
400.0000 mg | ORAL_TABLET | Freq: Four times a day (QID) | ORAL | Status: DC | PRN
  Administered 2015-02-09: 400 mg via ORAL
  Filled 2015-02-08: qty 2

## 2015-02-08 MED ORDER — HYDROCODONE-ACETAMINOPHEN 5-325 MG PO TABS
1.0000 | ORAL_TABLET | ORAL | Status: DC | PRN
  Administered 2015-02-08 – 2015-02-10 (×7): 2 via ORAL
  Filled 2015-02-08 (×8): qty 2

## 2015-02-08 MED ORDER — BUPROPION HCL ER (XL) 150 MG PO TB24
150.0000 mg | ORAL_TABLET | Freq: Every day | ORAL | Status: DC
Start: 2015-02-08 — End: 2015-02-10
  Administered 2015-02-08: 150 mg via ORAL
  Filled 2015-02-08 (×2): qty 1

## 2015-02-08 MED ORDER — METRONIDAZOLE 500 MG PO TABS
500.0000 mg | ORAL_TABLET | Freq: Two times a day (BID) | ORAL | Status: DC
  Administered 2015-02-08 – 2015-02-10 (×4): 500 mg via ORAL
  Filled 2015-02-08 (×4): qty 1

## 2015-02-08 MED ORDER — SODIUM CHLORIDE 0.9 % IV SOLN
INTRAVENOUS | Status: DC
  Administered 2015-02-08 – 2015-02-10 (×3): via INTRAVENOUS

## 2015-02-08 MED ORDER — EMTRICITABINE-TENOFOVIR AF 200-25 MG PO TABS
1.0000 | ORAL_TABLET | Freq: Every day | ORAL | Status: DC
  Administered 2015-02-08 – 2015-02-10 (×3): 1 via ORAL
  Filled 2015-02-08 (×3): qty 1

## 2015-02-08 MED ORDER — ONDANSETRON HCL 4 MG/2ML IJ SOLN
4.0000 mg | Freq: Once | INTRAMUSCULAR | Status: AC
  Administered 2015-02-08: 4 mg via INTRAVENOUS
  Filled 2015-02-08: qty 2

## 2015-02-08 MED ORDER — IOHEXOL 300 MG/ML  SOLN
25.0000 mL | Freq: Once | INTRAMUSCULAR | Status: AC | PRN
  Administered 2015-02-08: 25 mL via ORAL

## 2015-02-08 MED ORDER — TRAZODONE HCL 50 MG PO TABS
50.0000 mg | ORAL_TABLET | Freq: Every day | ORAL | Status: DC
  Administered 2015-02-08 – 2015-02-09 (×2): 50 mg via ORAL
  Filled 2015-02-08 (×2): qty 1

## 2015-02-08 MED ORDER — RITONAVIR 100 MG PO TABS
100.0000 mg | ORAL_TABLET | Freq: Every day | ORAL | Status: DC
  Filled 2015-02-08: qty 1

## 2015-02-08 MED ORDER — ENOXAPARIN SODIUM 30 MG/0.3ML ~~LOC~~ SOLN
30.0000 mg | SUBCUTANEOUS | Status: DC
  Administered 2015-02-08 – 2015-02-09 (×2): 30 mg via SUBCUTANEOUS
  Filled 2015-02-08 (×2): qty 0.3

## 2015-02-08 NOTE — ED Notes (Signed)
Patient transported to CT 

## 2015-02-08 NOTE — ED Notes (Signed)
Patient c/o mid abdominal pain x 2 days. Patient states she was here yesterday and has not stopped vomiting and is unable to take antibiotics because of vomiting. Patient states a history of colitis.

## 2015-02-08 NOTE — ED Notes (Signed)
Critical lab wbc 1.3 reported to md

## 2015-02-08 NOTE — ED Provider Notes (Signed)
CSN: [id number]     Arrival date & time 02/08/15  1112 History   First MD Initiated Contact with Patient 02/08/15 1131     Chief Complaint  Patient presents with  . Emesis  . Abdominal Pain     (Consider location/radiation/quality/duration/timing/severity/associated sxs/prior Treatment) HPI  Blood pressure 112/67, pulse 50, temperature 98.4 F (36.9 C), temperature source Oral, resp. rate 16, height 5\' 4"  (1.626 m), weight 100 lb 8 oz (45.587 kg), last menstrual period 12/17/2014, SpO2 100 %.  [name] is a 29 y.o. female with past medical history significant for HIV (last CD4 count was 490 in August 2016), Hodgkin's lymphoma, actively undergoing chemotherapy complaining of repeated episodes of nonbloody, nonbilious, no coffee-ground emesis starting 3 days ago with severe epigastric abdominal pain consistent with prior episode of colitis. Patient has had 3 visits in the last 24 hours and is now amenable to admission. She denies fever, chills, dysuria, hematuria, abnormal vaginal discharge.  States her last bowel movement was 3 days ago.   ID: Comer OncAlvy Bimler   Past Medical History  Diagnosis Date  . HIV (human immunodeficiency virus infection) ([place])   . Depression   . Anemia   . HSV (herpes simplex virus) infection   . History of shingles   . AIN III (anal intraepithelial neoplasia III)   . Condyloma acuminatum in female   . History of chronic bronchitis   . History of esophagitis     CANDIDA  . Periodontitis, chronic   . Hodgkin's lymphoma ([place]) 06/12/2014  . Cancer ([place])     Hodgkin lymphoma  . Hypokalemia 07/17/2014   Past Surgical History  Procedure Laterality Date  . Dilation and curettage of uterus  2005    MISSED AB  . Examination under anesthesia N/A 09/23/2012    Procedure: EXAM UNDER ANESTHESIA;  Surgeon: Adin Hector, MD;  Location: [place];  Service: General;  Laterality: N/A;  . Laser ablation condolamata N/A 09/23/2012     Procedure: REMOVAL/ABLATION  ABLATION CONDOLAMATA WARTS;  Surgeon: Adin Hector, MD;  Location: [place];  Service: General;  Laterality: N/A;   Family History  Problem Relation Age of Onset  . Cancer Maternal Aunt     unknown ca  . Cancer Maternal Grandmother     unknown ca   Social History  Substance Use Topics  . Smoking status: Current Every Day Smoker -- 0.00 packs/day for 7 years    Types: Cigars    Start date: 03/19/2014  . Smokeless tobacco: Never Used     Comment: she smokes 3 Black and Mild Cigars daily  . Alcohol Use: No   OB History    Gravida Para Term Preterm AB TAB SAB Ectopic Multiple Living   4 0 0  2  1 1   0     Review of Systems  10 systems reviewed and found to be negative, except as noted in the HPI.   Allergies  Review of patient's allergies indicates no known allergies.  Home Medications   Prior to Admission medications   Medication Sig Start Date End Date Taking? Authorizing Provider  atazanavir (REYATAZ) 300 MG capsule Take 1 capsule (300 mg total) by mouth daily with breakfast. TAKE WITH NORVIR 09/12/14   Truman Hayward, MD  buPROPion (WELLBUTRIN XL) 150 MG 24 hr tablet Take 150 mg by mouth at bedtime.     Historical Provider, MD  ciprofloxacin (CIPRO) 500 MG tablet Take 1 tablet (  500 mg total) by mouth 2 (two) times daily. 02/07/15   Varney Biles, MD  emtricitabine-tenofovir (TRUVADA) 200-300 MG per tablet Take 1 tablet by mouth daily. 09/12/14   Truman Hayward, MD  HYDROcodone-acetaminophen [facility]) 5-325 MG tablet Take 1 tablet by mouth every 6 (six) hours as needed. 02/07/15   Tatyana Kirichenko, PA-C  hydrocortisone cream 0.5 % Apply 1 application topically 2 (two) times daily. Patient not taking: Reported on 02/07/2015 12/19/14   Thayer Headings, MD  ibuprofen (ADVIL,MOTRIN) 200 MG tablet Take 400 mg by mouth every 6 (six) hours as needed for moderate pain.    Historical Provider, MD  ketoconazole (NIZORAL) 2 %  cream Apply 1 application topically 2 (two) times daily as needed for irritation. 12/25/14   Thayer Headings, MD  lidocaine-prilocaine (EMLA) cream Apply to affected area once 06/26/14   Heath Lark, MD  metroNIDAZOLE (FLAGYL) 500 MG tablet Take 1 tablet (500 mg total) by mouth 2 (two) times daily. 02/07/15   Varney Biles, MD  Naphazoline HCl (CLEAR EYES OP) Apply 2 drops to eye 2 (two) times daily.    Historical Provider, MD  ondansetron (ZOFRAN ODT) 8 MG disintegrating tablet Take 1 tablet (8 mg total) by mouth every 8 (eight) hours as needed for nausea or vomiting. 02/07/15   Tatyana Kirichenko, PA-C  ondansetron (ZOFRAN) 4 MG tablet Take 1 tablet (4 mg total) by mouth every 6 (six) hours. 01/10/15   Harvel Quale, MD  potassium chloride SA (K-DUR,KLOR-CON) 20 MEQ tablet Take 1 tablet (20 mEq total) by mouth 2 (two) times daily. 07/17/14   Heath Lark, MD  ritonavir (NORVIR) 100 MG TABS tablet Take 1 tablet (100 mg total) by mouth daily with breakfast. TAKE WITH REYATAZ 09/12/14   Truman Hayward, MD  traZODone (DESYREL) 50 MG tablet Take 50 mg by mouth at bedtime.    Historical Provider, MD   BP 112/67 mmHg  Pulse 50  Temp(Src) 98.4 F (36.9 C) (Oral)  Resp 16  Ht 5\' 4"  (1.626 m)  Wt 100 lb 8 oz (45.587 kg)  BMI 17.24 kg/m2  SpO2 100%  LMP 12/17/2014 Physical Exam  Constitutional: She is oriented to person, place, and time. She appears well-developed and well-nourished. No distress.  HENT:  Head: Normocephalic.  Mouth/Throat: Oropharynx is clear and moist.  Eyes: Conjunctivae and EOM are normal. Pupils are equal, round, and reactive to light.  Cardiovascular: Normal rate, regular rhythm and intact distal pulses.   Pulmonary/Chest: Effort normal and breath sounds normal. No stridor. No respiratory distress. She has no wheezes. She has no rales. She exhibits no tenderness.  Abdominal: Soft. Bowel sounds are normal. She exhibits no distension and no mass. There is no tenderness. There is  no rebound and no guarding.  Musculoskeletal: Normal range of motion.  Neurological: She is alert and oriented to person, place, and time.  Psychiatric: She has a normal mood and affect.  Nursing note and vitals reviewed.   ED Course  Procedures (including critical care time) Labs Review Labs Reviewed  CBC WITH DIFFERENTIAL/PLATELET - Abnormal; Notable for the following:    WBC 1.3 (*)    Neutro Abs 0.2 (*)    All other components within normal limits  LIPASE, BLOOD - Abnormal; Notable for the following:    Lipase 16 (*)    All other components within normal limits  URINALYSIS, ROUTINE W REFLEX MICROSCOPIC (NOT AT [facility]) - Abnormal; Notable for the following:    APPearance  CLOUDY (*)    Hgb urine dipstick TRACE (*)    Bilirubin Urine SMALL (*)    Ketones, ur 15 (*)    All other components within normal limits  URINE MICROSCOPIC-ADD ON - Abnormal; Notable for the following:    Squamous Epithelial / LPF FEW (*)    All other components within normal limits  COMPREHENSIVE METABOLIC PANEL  PREGNANCY, URINE  URINE RAPID DRUG SCREEN, HOSP PERFORMED  I-STAT CG4 LACTIC ACID, ED    Imaging Review Dg Chest 2 View  02/07/2015  CLINICAL DATA:  Upper abdominal pain radiating to the back. Vomiting for 24 hours. Cough. Patient is having chemotherapy for Hodgkin's lymphoma. EXAM: CHEST  2 VIEW COMPARISON:  08/10/2014 FINDINGS: Power port type central venous catheter with tip over the low SVC region. No pneumothorax. Normal heart size and pulmonary vascularity. No focal airspace disease or consolidation in the lungs. No blunting of costophrenic angles. No pneumothorax. Mediastinal contours appear intact. IMPRESSION: No active cardiopulmonary disease. Electronically Signed   By: Lucienne Capers M.D.   On: 02/07/2015 05:01   I have personally reviewed and evaluated these images and lab results as part of my medical decision-making.   EKG Interpretation None      MDM   Final diagnoses:   Leukopenia  Intractable vomiting with nausea, vomiting of unspecified type  Epigastric abdominal pain    Filed Vitals:   02/08/15 1128  BP: 112/67  Pulse: 50  Temp: 98.4 F (36.9 C)  TempSrc: Oral  Resp: 16  Height: 5\' 4"  (1.626 m)  Weight: 100 lb 8 oz (45.587 kg)  SpO2: 100%    Medications  iohexol (OMNIPAQUE) 300 MG/ML solution 25 mL (not administered)  sodium chloride 0.9 % bolus 1,000 mL (1,000 mLs Intravenous New Bag/Given 02/08/15 1206)  morphine 4 MG/ML injection 4 mg (4 mg Intravenous Given 02/08/15 1206)  ondansetron (ZOFRAN) injection 4 mg (4 mg Intravenous Given 02/08/15 1207)  Ampicillin-Sulbactam (UNASYN) 3 g in sodium chloride 0.9 % 100 mL IVPB (3 g Intravenous New Bag/Given 02/08/15 1207)    [name] is 29 y.o. female presenting with Multiple episodes of nausea vomiting and epigastric abdominal pain. Patient has not been having a bowel movement over the last several days. Abdominal exam is nonsurgical. Vital signs without acute abnormality, she does have a mild bradycardia. This patient has HIV and Hodgkin's lymphoma. She's undergoing chemotherapy. This is her fourth visit in the last 24 hours, she is not tolerating by mouth's at home. Will need admission. Patient's white blood cell count is dropping to 1.3, it was 1.6 yesterday. Blood cultures were drawn several days ago, they are still pending. She is afebrile in the ED with normal lactic acid level.Unasyn given for presumed colitis.  Repeat abdominal exam remains benign, patient is comfortable after morphine and reports that she feels much better.  CT abdomen pelvis with no acute abnormalities.  Patient will need admission for IV fluids, intractable emesis nausea vomiting.  Case discussed with Dr. Doyle Askew who accepts and observation admission. She states that this is possibly secondary to cannabinoid hyperemesis syndrome. Called the lab to ensure they could run urine drug screen on patient's prior UA, they  can. Dr. Doyle Askew put him holding orders.    Monico Blitz, PA-C 02/08/15 [place], MD 02/09/15 [phone]

## 2015-02-08 NOTE — Progress Notes (Signed)
   Subjective:    Patient ID: [name], female    DOB: [birth year], 29 y.o.   MRN: [id number]  HPI Patient seen for follow up of right dermoid cyst, which was discovered on CT in Sept.  She has been having abdominal pain and vomiting, which has been attributed to colitis, which was discovered on the same CT.  She was seen last night in the ED, prescribed antibiotics, but has been vomiting. She has lymphoma and is being treated with chemotherapy, which she will continue until January.   Review of Systems  Constitutional: Negative for fever, chills and fatigue.  Gastrointestinal: Positive for nausea, vomiting and abdominal pain. Negative for blood in stool.  Genitourinary: Negative for frequency, vaginal bleeding, vaginal discharge, vaginal pain, pelvic pain and dyspareunia.   I have reviewed the patients past medical, family, and social history.  I have reviewed the patient's medication list and allergies.     Objective:   Physical Exam  Constitutional: She appears well-developed and well-nourished.  HENT:  Head: Normocephalic.  Cardiovascular: Normal rate, regular rhythm and normal heart sounds.  Exam reveals no gallop and no friction rub.   No murmur heard. Abdominal: Soft. Bowel sounds are normal. She exhibits no mass. There is tenderness (diffuse tenderness, worse in epigastric region). There is no rebound and no guarding.  Skin: Skin is warm and dry.  Psychiatric: She has a normal mood and affect. Her behavior is normal. Judgment and thought content normal.      Assessment & Plan:  1. Dermoid cyst of right ovary Discussed nature of dermoid cyst, that almost all are benign, but that waiting until completion of chemo would be appropriate.  F/u in January after completion of chemo for removal.  2. Other classical hodgkin lymphoma, lymph nodes of axilla and upper limb  3. Colitis Patient not able to take medications.  Advised pt to return to ED if unable to take  antibiotics.

## 2015-02-08 NOTE — H&P (Addendum)
[facility] History and Physical  [name] DUK:[id number] DOB: 09/29/1985 DOA: 02/08/2015  Referring physician: ED PA, [name]  PCP: Allison Gloss, MD   Chief Complaint: nausea and vomiting  HPI:  29 y.o. female with HIV (last CD4 count was 109 in August 2016), Hodgkin's lymphoma, actively undergoing chemotherapy presented to [facility] ED with persistent nausea and non bloody vomiting, associated with abd cramping in epigastric area, worse with eating and drinking and with no specific alleviating factors, no specific radiating symptoms, 5/10 in severity when present and no similar events in the past. She denies fevers, chills, chest pain or shortness of breath, no recent sick contacts or exposures.   In ED, pt noted to be hemodynamically stable with VSS, vomiting in ED. [facility] asked to admit for observation.   Assessment and Plan: Active Problems: Intractable nausea and vomiting - no clear etiology, CT abd negative, ? THC related as pt has known history of THC use - UDS requested - place on IVF, antiemetics as needed - advance diet as pt able to tolerate  HIV - follows with [name]  PCM, severe  - underweight - Body mass index is 17.24 kg/(m^2).  Hodgkin's lymphoma - outpatient follow up  Leukopenia - from HIV and lymphoma - CBC In AM  DVT prophylaxis - Lovenox SQ   Radiological Exams on Admission: Dg Chest 2 View  02/07/2015  CLINICAL DATA:  Upper abdominal pain radiating to the back. Vomiting for 24 hours. Cough. Patient is having chemotherapy for Hodgkin's lymphoma. EXAM: CHEST  2 VIEW COMPARISON:  08/10/2014 FINDINGS: Power port type central venous catheter with tip over the low SVC region. No pneumothorax. Normal heart size and pulmonary vascularity. No focal airspace disease or consolidation in the lungs. No blunting of costophrenic angles. No pneumothorax. Mediastinal contours appear intact. IMPRESSION: No active cardiopulmonary disease. Electronically Signed    By: Lucienne Capers M.D.   On: 02/07/2015 05:01   Ct Abdomen Pelvis W Contrast  02/08/2015  CLINICAL DATA:  29 year old female with acute abdominal pain and vomiting for 2 days. History of Hodgkin's lymphoma. EXAM: CT ABDOMEN AND PELVIS WITH CONTRAST TECHNIQUE: Multidetector CT imaging of the abdomen and pelvis was performed using the standard protocol following bolus administration of intravenous contrast. CONTRAST:  92mL OMNIPAQUE IOHEXOL 300 MG/ML COMPARISON:  01/11/2015 PET-CT and prior CTs FINDINGS: Lower chest:  Unremarkable Hepatobiliary: Heterogeneous appearing liver again noted. The gallbladder is unremarkable. There is no evidence of biliary dilatation. Pancreas: Unremarkable Spleen: Unremarkable Adrenals/Urinary Tract: The kidneys, adrenal glands and bladder are unremarkable. There is no evidence of hydronephrosis or urinary calculi. Stomach/Bowel: Unremarkable. No definite bowel wall thickening identified. There is no evidence of bowel obstruction. Vascular/Lymphatic: Enlarged retroperitoneal lymph nodes are unchanged with index 2 x 2.9 cm left para-aortic node (image 30). No new or enlarging lymph nodes are identified. Shotty pelvic lymph nodes are again identified. There is no evidence of abdominal aortic aneurysm or vascular abnormality. Reproductive: A 3.6 cm right ovarian dermoid with primarily fatty components again noted. The uterus and adnexal regions are otherwise unremarkable Other: No free fluid, abscess or pneumoperitoneum. Musculoskeletal: No acute or suspicious abnormalities. The bony lesions identified on recent PET CT are difficult to visualize. IMPRESSION: No evidence of acute abnormality. Unchanged retroperitoneal lymphadenopathy. 3.6 cm right ovarian dermoid. Electronically Signed   By: Margarette Canada M.D.   On: 02/08/2015 15:11    Code Status: Full Family Communication: Pt at bedside Disposition Plan: Admit for further evaluation    [name]  Doyle Askew [facility] [phone]   Review of  Systems:  Constitutional: Negative for fever, chills and malaise/fatigue. Negative for diaphoresis.  HENT: Negative for hearing loss, ear pain, nosebleeds, congestion, sore throat, neck pain, tinnitus and ear discharge.   Eyes: Negative for blurred vision, double vision, photophobia, pain, discharge and redness.  Respiratory: Negative for cough, hemoptysis, sputum production, shortness of breath, wheezing and stridor.   Cardiovascular: Negative for chest pain, palpitations, orthopnea, claudication and leg swelling.  Gastrointestinal: Negative for heartburn, constipation, blood in stool and melena.  Genitourinary: Negative for dysuria, urgency, frequency, hematuria and flank pain.  Musculoskeletal: Negative for myalgias, back pain, joint pain and falls.  Skin: Negative for itching and rash.  Neurological: Negative for dizziness and weakness. Endo/Heme/Allergies: Negative for environmental allergies and polydipsia. Does not bruise/bleed easily.  Psychiatric/Behavioral: Negative for suicidal ideas. The patient is not nervous/anxious.     Past Medical History  Diagnosis Date  . HIV (human immunodeficiency virus infection) ([place])   . Depression   . Anemia   . HSV (herpes simplex virus) infection   . History of shingles   . AIN III (anal intraepithelial neoplasia III)   . Condyloma acuminatum in female   . History of chronic bronchitis   . History of esophagitis     CANDIDA  . Periodontitis, chronic   . Hodgkin's lymphoma ([place]) 06/12/2014  . Cancer ([place])     Hodgkin lymphoma  . Hypokalemia 07/17/2014    Past Surgical History  Procedure Laterality Date  . Dilation and curettage of uterus  2005    MISSED AB  . Examination under anesthesia N/A 09/23/2012    Procedure: EXAM UNDER ANESTHESIA;  Surgeon: Adin Hector, MD;  Location: [place];  Service: General;  Laterality: N/A;  . Laser ablation condolamata N/A 09/23/2012    Procedure: REMOVAL/ABLATION  ABLATION  CONDOLAMATA WARTS;  Surgeon: Adin Hector, MD;  Location: [place];  Service: General;  Laterality: N/A;    Social History:  reports that she has been smoking Cigars.  She started smoking about 10 months ago. She has never used smokeless tobacco. She reports that she uses illicit drugs (Marijuana). She reports that she does not drink alcohol.  No Known Allergies  Family History  Problem Relation Age of Onset  . Cancer Maternal Aunt     unknown ca  . Cancer Maternal Grandmother     unknown ca    Prior to Admission medications   Medication Sig Start Date End Date Taking? Authorizing Provider  atazanavir (REYATAZ) 300 MG capsule Take 1 capsule (300 mg total) by mouth daily with breakfast. TAKE WITH NORVIR 09/12/14  Yes Truman Hayward, MD  buPROPion (WELLBUTRIN XL) 150 MG 24 hr tablet Take 150 mg by mouth at bedtime.    Yes Historical Provider, MD  ciprofloxacin (CIPRO) 500 MG tablet Take 1 tablet (500 mg total) by mouth 2 (two) times daily. 02/07/15  Yes Varney Biles, MD  emtricitabine-tenofovir (TRUVADA) 200-300 MG per tablet Take 1 tablet by mouth daily. 09/12/14  Yes Truman Hayward, MD  HYDROcodone-acetaminophen [facility]) 5-325 MG tablet Take 1 tablet by mouth every 6 (six) hours as needed. Patient taking differently: Take 1 tablet by mouth every 6 (six) hours as needed for moderate pain.  02/07/15  Yes Tatyana Kirichenko, PA-C  hydrocortisone cream 0.5 % Apply 1 application topically 2 (two) times daily. 12/19/14  Yes Thayer Headings, MD  ibuprofen (ADVIL,MOTRIN) 200 MG tablet Take 400 mg  by mouth every 6 (six) hours as needed for moderate pain.   Yes Historical Provider, MD  lidocaine-prilocaine (EMLA) cream Apply to affected area once 06/26/14  Yes Heath Lark, MD  metroNIDAZOLE (FLAGYL) 500 MG tablet Take 1 tablet (500 mg total) by mouth 2 (two) times daily. 02/07/15  Yes Varney Biles, MD  Naphazoline HCl (CLEAR EYES OP) Apply 2 drops to eye 3 (three) times  daily as needed (redness).    Yes Historical Provider, MD  ondansetron (ZOFRAN ODT) 8 MG disintegrating tablet Take 1 tablet (8 mg total) by mouth every 8 (eight) hours as needed for nausea or vomiting. 02/07/15  Yes Tatyana Kirichenko, PA-C  ondansetron (ZOFRAN) 4 MG tablet Take 1 tablet (4 mg total) by mouth every 6 (six) hours. 01/10/15  Yes Harvel Quale, MD  ritonavir (NORVIR) 100 MG TABS tablet Take 1 tablet (100 mg total) by mouth daily with breakfast. TAKE WITH REYATAZ 09/12/14  Yes Truman Hayward, MD  traZODone (DESYREL) 50 MG tablet Take 50 mg by mouth at bedtime.   Yes Historical Provider, MD  ketoconazole (NIZORAL) 2 % cream Apply 1 application topically 2 (two) times daily as needed for irritation. 12/25/14   Thayer Headings, MD  potassium chloride SA (K-DUR,KLOR-CON) 20 MEQ tablet Take 1 tablet (20 mEq total) by mouth 2 (two) times daily. 07/17/14   Heath Lark, MD    Physical Exam: Filed Vitals:   02/08/15 1128 02/08/15 1340  BP: 112/67 135/69  Pulse: 50 50  Temp: 98.4 F (36.9 C)   TempSrc: Oral   Resp: 16 12  Height: 5\' 4"  (1.626 m)   Weight: 45.587 kg (100 lb 8 oz)   SpO2: 100% 100%    Physical Exam  Constitutional: Appears well-developed and well-nourished. No distress.  HENT: Normocephalic. External right and left ear normal. Dry MM Eyes: Conjunctivae and EOM are normal. PERRLA, no scleral icterus.  Neck: Normal ROM. Neck supple. No JVD. No tracheal deviation. No thyromegaly.  CVS: RRR, S1/S2 +, no murmurs, no gallops, no carotid bruit.  Pulmonary: Effort and breath sounds normal, no stridor, rhonchi, wheezes, rales.  Abdominal: Soft. BS +,  no distension, tenderness, rebound or guarding.  Musculoskeletal: Normal range of motion. No edema and no tenderness.  Lymphadenopathy: No lymphadenopathy noted, cervical, inguinal. Neuro: Alert. Normal reflexes, muscle tone coordination. No cranial nerve deficit. Skin: Skin is warm and dry. No rash noted. Not diaphoretic.  No erythema. No pallor.  Psychiatric: Normal mood and affect. Behavior, judgment, thought content normal.   Labs on Admission:  Basic Metabolic Panel:  Recent Labs Lab 02/07/15 0302 02/08/15 1158  NA 135 138  K 3.8 3.5  CL 99* 103  CO2 25 23  GLUCOSE 106* 76  BUN 13 8  CREATININE 0.55 0.51  CALCIUM 9.6 8.9   Liver Function Tests:  Recent Labs Lab 02/08/15 1158  AST 25  ALT 22  ALKPHOS 66  BILITOT 0.5  PROT 7.1  ALBUMIN 3.9    Recent Labs Lab 02/08/15 1158  LIPASE 16*   CBC:  Recent Labs Lab 02/07/15 0302 02/08/15 1158  WBC 1.6* 1.3*  NEUTROABS 0.6* 0.2*  HGB 14.5 12.8  HCT 41.0 36.5  MCV 89.1 88.4  PLT 260 236    EKG: pending    If 7PM-7AM, please contact night-coverage [email or web address] Password [email or web address] 02/08/2015, 3:30 PM

## 2015-02-08 NOTE — Patient Instructions (Signed)
Ovarian Cyst An ovarian cyst is a fluid-filled sac that forms on an ovary. The ovaries are small organs that produce eggs in women. Various types of cysts can form on the ovaries. Most are not cancerous. Many do not cause problems, and they often go away on their own. Some may cause symptoms and require treatment. Common types of ovarian cysts include:  Functional cysts--These cysts may occur every month during the menstrual cycle. This is normal. The cysts usually go away with the next menstrual cycle if the woman does not get pregnant. Usually, there are no symptoms with a functional cyst.  Endometrioma cysts--These cysts form from the tissue that lines the uterus. They are also called "chocolate cysts" because they become filled with blood that turns brown. This type of cyst can cause pain in the lower abdomen during intercourse and with your menstrual period.  Cystadenoma cysts--This type develops from the cells on the outside of the ovary. These cysts can get very big and cause lower abdomen pain and pain with intercourse. This type of cyst can twist on itself, cut off its blood supply, and cause severe pain. It can also easily rupture and cause a lot of pain.  Dermoid cysts--This type of cyst is sometimes found in both ovaries. These cysts may contain different kinds of body tissue, such as skin, teeth, hair, or cartilage. They usually do not cause symptoms unless they get very big.  Theca lutein cysts--These cysts occur when too much of a certain hormone (human chorionic gonadotropin) is produced and overstimulates the ovaries to produce an egg. This is most common after procedures used to assist with the conception of a baby (in vitro fertilization). CAUSES   Fertility drugs can cause a condition in which multiple large cysts are formed on the ovaries. This is called ovarian hyperstimulation syndrome.  A condition called polycystic ovary syndrome can cause hormonal imbalances that can lead to  nonfunctional ovarian cysts. SIGNS AND SYMPTOMS  Many ovarian cysts do not cause symptoms. If symptoms are present, they may include:  Pelvic pain or pressure.  Pain in the lower abdomen.  Pain during sexual intercourse.  Increasing girth (swelling) of the abdomen.  Abnormal menstrual periods.  Increasing pain with menstrual periods.  Stopping having menstrual periods without being pregnant. DIAGNOSIS  These cysts are commonly found during a routine or annual pelvic exam. Tests may be ordered to find out more about the cyst. These tests may include:  Ultrasound.  X-ray of the pelvis.  CT scan.  MRI.  Blood tests. TREATMENT  Many ovarian cysts go away on their own without treatment. Your health care provider may want to check your cyst regularly for 2-3 months to see if it changes. For women in menopause, it is particularly important to monitor a cyst closely because of the higher rate of ovarian cancer in menopausal women. When treatment is needed, it may include any of the following:  A procedure to drain the cyst (aspiration). This may be done using a long needle and ultrasound. It can also be done through a laparoscopic procedure. This involves using a thin, lighted tube with a tiny camera on the end (laparoscope) inserted through a small incision.  Surgery to remove the whole cyst. This may be done using laparoscopic surgery or an open surgery involving a larger incision in the lower abdomen.  Hormone treatment or birth control pills. These methods are sometimes used to help dissolve a cyst. HOME CARE INSTRUCTIONS   Only take over-the-counter   or prescription medicines as directed by your health care provider.  Follow up with your health care provider as directed.  Get regular pelvic exams and Pap tests. SEEK MEDICAL CARE IF:   Your periods are late, irregular, or painful, or they stop.  Your pelvic pain or abdominal pain does not go away.  Your abdomen becomes  larger or swollen.  You have pressure on your bladder or trouble emptying your bladder completely.  You have pain during sexual intercourse.  You have feelings of fullness, pressure, or discomfort in your stomach.  You lose weight for no apparent reason.  You feel generally ill.  You become constipated.  You lose your appetite.  You develop acne.  You have an increase in body and facial hair.  You are gaining weight, without changing your exercise and eating habits.  You think you are pregnant. SEEK IMMEDIATE MEDICAL CARE IF:   You have increasing abdominal pain.  You feel sick to your stomach (nauseous), and you throw up (vomit).  You develop a fever that comes on suddenly.  You have abdominal pain during a bowel movement.  Your menstrual periods become heavier than usual. MAKE SURE YOU:  Understand these instructions.  Will watch your condition.  Will get help right away if you are not doing well or get worse.   This information is not intended to replace advice given to you by your health care provider. Make sure you discuss any questions you have with your health care provider.   Document Released: 04/13/2005 Document Revised: 04/18/2013 Document Reviewed: 12/19/2012 Elsevier Interactive Patient Education 2016 [employer]  

## 2015-02-09 DIAGNOSIS — G43A1 Cyclical vomiting, intractable: Secondary | ICD-10-CM

## 2015-02-09 DIAGNOSIS — B20 Human immunodeficiency virus [HIV] disease: Secondary | ICD-10-CM

## 2015-02-09 LAB — CBC
HCT: 34.3 % — ABNORMAL LOW (ref 36.0–46.0)
Hemoglobin: 11.8 g/dL — ABNORMAL LOW (ref 12.0–15.0)
MCH: 30.6 pg (ref 26.0–34.0)
MCHC: 34.4 g/dL (ref 30.0–36.0)
MCV: 88.9 fL (ref 78.0–100.0)
Platelets: 193 10*3/uL (ref 150–400)
RBC: 3.86 MIL/uL — ABNORMAL LOW (ref 3.87–5.11)
RDW: 12.8 % (ref 11.5–15.5)
WBC: 0.7 10*3/uL — CL (ref 4.0–10.5)

## 2015-02-09 LAB — BASIC METABOLIC PANEL
ANION GAP: 8 (ref 5–15)
BUN: <5 mg/dL — ABNORMAL LOW (ref 6–20)
CHLORIDE: 104 mmol/L (ref 101–111)
CO2: 25 mmol/L (ref 22–32)
Calcium: 8.5 mg/dL — ABNORMAL LOW (ref 8.9–10.3)
Creatinine, Ser: 0.7 mg/dL (ref 0.44–1.00)
GFR calc non Af Amer: >60 mL/min (ref >60)
GLUCOSE: 79 mg/dL (ref 65–99)
Potassium: 3.7 mmol/L (ref 3.5–5.1)
Sodium: 137 mmol/L (ref 135–145)

## 2015-02-09 MED ORDER — TBO-FILGRASTIM 480 MCG/0.8ML ~~LOC~~ SOSY
480.0000 ug | PREFILLED_SYRINGE | Freq: Once | SUBCUTANEOUS | Status: AC
  Administered 2015-02-09: 480 ug via SUBCUTANEOUS
  Filled 2015-02-09: qty 0.8

## 2015-02-09 MED ORDER — HYDROMORPHONE HCL 1 MG/ML IJ SOLN
1.0000 mg | INTRAMUSCULAR | Status: DC | PRN
  Administered 2015-02-09: 1 mg via INTRAVENOUS
  Filled 2015-02-09: qty 1

## 2015-02-09 MED ORDER — DIPHENHYDRAMINE HCL 50 MG/ML IJ SOLN
25.0000 mg | Freq: Four times a day (QID) | INTRAMUSCULAR | Status: DC | PRN
  Administered 2015-02-09: 25 mg via INTRAVENOUS
  Filled 2015-02-09: qty 1

## 2015-02-09 NOTE — Progress Notes (Signed)
Patient ID: [name], female   DOB: 1986-04-19, 29 y.o.   MRN: [id number]  [facility] PROGRESS NOTE  EDY MCBANE OIN:[id number] DOB: [birth year] DOA: 02/08/2015 PCP: Scharlene Gloss, MD   Brief narrative:    DVT pro29 y.o. female with HIV (last CD4 count was 31 in August 2016), Hodgkin's lymphoma, actively undergoing chemotherapy presented to [facility] ED with persistent nausea and non bloody vomiting, associated with abd cramping in epigastric area, worse with eating and drinking and with no specific alleviating factors, no specific radiating symptoms, 5/10 in severity when present and no similar events in the past. She denies fevers, chills, chest pain or shortness of breath, no recent sick contacts or exposures.   In ED, pt noted to be hemodynamically stable with VSS, vomiting in ED. [facility] asked to admit for observation.   Assessment/Plan:    Active Problems: Intractable nausea and vomiting - no clear etiology, CT abd negative, ? THC related as pt has known history of THC use - UDS positive for THC - continue IVF, antiemetics as needed - advance diet as pt able to tolerate  HIV - follows with Dr. Linus Salmons  PCM, severe  - underweight - Body mass index is 17.24 kg/(m^2).  Hodgkin's lymphoma - outpatient follow up  Leukopenia - from HIV and lymphoma - needs one dose of Neuopgen today - CBC In AM  DVT prophylaxis - Lovenox SQ  Code Status: Full.  Family Communication:  plan of care discussed with the patient Disposition Plan: Home in 24 - 48 hours   IV access:  Peripheral IV  Procedures and diagnostic studies:    Ct Abdomen Pelvis W Contrast 02/08/2015 No evidence of acute abnormality. Unchanged retroperitoneal lymphadenopathy. 3.6 cm right ovarian dermoid. Electronically   Medical Consultants:  None  Other Consultants:  None  IAnti-Infectives:   None  Faye Ramsay, MD  [facility] Pager [phone]  If 7PM-7AM, please contact  night-coverage [email or web address] Password [email or web address] 02/09/2015, 7:49 AM     HPI/Subjective: No events overnight.   Objective: Filed Vitals:   02/08/15 1554 02/08/15 1638 02/08/15 2051 02/09/15 0514  BP: 107/56 108/63 108/74 115/77  Pulse: 50 53 63 99  Temp:  98.5 F (36.9 C) 97.8 F (36.6 C) 97.8 F (36.6 C)  TempSrc:  Oral Oral Oral  Resp: 16 18 16 16   Height:      Weight:      SpO2: 100% 100% 99% 100%    Intake/Output Summary (Last 24 hours) at 02/09/15 0749 Last data filed at 02/09/15 0516  Gross per 24 hour  Intake   1150 ml  Output   1000 ml  Net    150 ml    Exam:   General:  Pt is alert, follows commands appropriately, not in acute distress  Cardiovascular: Regular rate and rhythm, S1/S2, no murmurs, no rubs, no gallops  Respiratory: Clear to auscultation bilaterally, no wheezing, no crackles, no rhonchi  Abdomen: Soft, non tender, non distended, bowel sounds present, no guarding  Extremities: No edema, pulses DP and PT palpable bilaterally  Neuro: Grossly nonfocal  Data Reviewed: Basic Metabolic Panel:  Recent Labs Lab 02/07/15 0302 02/08/15 1158 02/09/15 0540  NA 135 138 137  K 3.8 3.5 3.7  CL 99* 103 104  CO2 25 23 25   GLUCOSE 106* 76 79  BUN 13 8 <5*  CREATININE 0.55 0.51 0.70  CALCIUM 9.6 8.9 8.5*   Liver Function Tests:  Recent Labs Lab 02/08/15 1158  AST 25  ALT 22  ALKPHOS 66  BILITOT 0.5  PROT 7.1  ALBUMIN 3.9    Recent Labs Lab 02/08/15 1158  LIPASE 16*   No results for input(s): AMMONIA in the last 168 hours. CBC:  Recent Labs Lab 02/07/15 0302 02/08/15 1158 02/09/15 0540  WBC 1.6* 1.3* 0.7*  NEUTROABS 0.6* 0.2*  --   HGB 14.5 12.8 11.8*  HCT 41.0 36.5 34.3*  MCV 89.1 88.4 88.9  PLT 260 236 193    Recent Results (from the past 240 hour(s))  Blood culture (routine x 2)     Status: None (Preliminary result)   Collection Time: 02/07/15  4:28 AM  Result Value Ref Range Status   Specimen Description BLOOD  RIGHT FOREARM  Final   Special Requests BOTTLES DRAWN AEROBIC ONLY 9ML  Final   Culture   Final    NO GROWTH 1 DAY Performed at [facility]    Report Status PENDING  Incomplete  Blood culture (routine x 2)     Status: None (Preliminary result)   Collection Time: 02/07/15  4:30 AM  Result Value Ref Range Status   Specimen Description BLOOD RIGHT ANTECUBITAL  Final   Special Requests BOTTLES DRAWN AEROBIC AND ANAEROBIC 7ML  Final   Culture   Final    NO GROWTH 1 DAY Performed at [facility]    Report Status PENDING  Incomplete     Scheduled Meds: . atazanavir  300 mg Oral Q breakfast  . buPROPion  150 mg Oral QHS  . ciprofloxacin  500 mg Oral BID  . emtricitabine-tenofovir AF  1 tablet Oral Daily  . enoxaparin (LOVENOX) injection  30 mg Subcutaneous Q24H  . feeding supplement (ENSURE ENLIVE)  237 mL Oral BID BM  . metroNIDAZOLE  500 mg Oral BID  . ritonavir  100 mg Oral Q breakfast  . Tbo-Filgrastim  480 mcg Subcutaneous Once  . traZODone  50 mg Oral QHS   Continuous Infusions: . sodium chloride 75 mL/hr at 02/09/15 0541

## 2015-02-10 DIAGNOSIS — R1013 Epigastric pain: Secondary | ICD-10-CM | POA: Insufficient documentation

## 2015-02-10 DIAGNOSIS — R1110 Vomiting, unspecified: Secondary | ICD-10-CM

## 2015-02-10 LAB — BASIC METABOLIC PANEL
ANION GAP: 5 (ref 5–15)
ANION GAP: 5 (ref 5–15)
BUN: <5 mg/dL — ABNORMAL LOW (ref 6–20)
CALCIUM: 8.4 mg/dL — AB (ref 8.9–10.3)
CHLORIDE: 108 mmol/L (ref 101–111)
CO2: 27 mmol/L (ref 22–32)
CO2: 26 mmol/L (ref 22–32)
Calcium: 8.3 mg/dL — ABNORMAL LOW (ref 8.9–10.3)
Chloride: 108 mmol/L (ref 101–111)
Creatinine, Ser: 0.66 mg/dL (ref 0.44–1.00)
Creatinine, Ser: 0.77 mg/dL (ref 0.44–1.00)
GLUCOSE: 81 mg/dL (ref 65–99)
Glucose, Bld: 92 mg/dL (ref 65–99)
POTASSIUM: 2.9 mmol/L — AB (ref 3.5–5.1)
POTASSIUM: 3.4 mmol/L — AB (ref 3.5–5.1)
SODIUM: 140 mmol/L (ref 135–145)
SODIUM: 139 mmol/L (ref 135–145)

## 2015-02-10 LAB — CBC
HCT: 31.8 % — ABNORMAL LOW (ref 36.0–46.0)
HEMOGLOBIN: 11.1 g/dL — AB (ref 12.0–15.0)
MCH: 31.3 pg (ref 26.0–34.0)
MCHC: 34.9 g/dL (ref 30.0–36.0)
MCV: 89.6 fL (ref 78.0–100.0)
PLATELETS: 202 10*3/uL (ref 150–400)
RBC: 3.55 MIL/uL — AB (ref 3.87–5.11)
RDW: 12.7 % (ref 11.5–15.5)
WBC: 1.8 10*3/uL — AB (ref 4.0–10.5)

## 2015-02-10 LAB — MAGNESIUM: MAGNESIUM: 1.7 mg/dL (ref 1.7–2.4)

## 2015-02-10 MED ORDER — HYDROCODONE-ACETAMINOPHEN 5-325 MG PO TABS: 1.0000 | ORAL_TABLET | Freq: Four times a day (QID) | ORAL | Status: DC | PRN

## 2015-02-10 MED ORDER — POTASSIUM CHLORIDE CRYS ER 20 MEQ PO TBCR
40.0000 meq | EXTENDED_RELEASE_TABLET | Freq: Two times a day (BID) | ORAL | Status: DC
  Administered 2015-02-10: 40 meq via ORAL
  Filled 2015-02-10: qty 2

## 2015-02-10 MED ORDER — BOOST PLUS PO LIQD
237.0000 mL | Freq: Three times a day (TID) | ORAL | Status: DC
  Filled 2015-02-10: qty 237

## 2015-02-10 MED ORDER — POTASSIUM CHLORIDE 10 MEQ/100ML IV SOLN
10.0000 meq | INTRAVENOUS | Status: AC
  Administered 2015-02-10 (×3): 10 meq via INTRAVENOUS
  Filled 2015-02-10 (×3): qty 100

## 2015-02-10 NOTE — Progress Notes (Signed)
Patient refused the wellbutrin.

## 2015-02-10 NOTE — Discharge Summary (Addendum)
Physician Discharge Summary  [name] JOA:[id number] DOB: 1985/11/03 DOA: 02/08/2015  PCP: Scharlene Gloss, MD  Admit date: 02/08/2015 Discharge date: 02/10/2015  Recommendations for Outpatient Follow-up:  1. Pt will need to follow up with PCP in 2-3 weeks post discharge 2. Please obtain BMP to evaluate electrolytes and kidney function 3. Please also check CBC to evaluate Hg and Hct levels  Discharge Diagnoses:  Principal Problem:   Intractable nausea and vomiting Active Problems:   Human immunodeficiency virus (HIV) disease ([place])   Hodgkin lymphoma ([place])   Protein-calorie malnutrition, severe ([place])   Leukopenia   Underweight  Discharge Condition: Stable  Diet recommendation: Heart healthy diet discussed in details    Brief narrative:    29 y.o. female with HIV (last CD4 count was 490 in August 2016), Hodgkin's lymphoma, actively undergoing chemotherapy presented to [facility] ED with persistent nausea and non bloody vomiting, associated with abd cramping in epigastric area, worse with eating and drinking and with no specific alleviating factors, no specific radiating symptoms, 5/10 in severity when present and no similar events in the past. She denies fevers, chills, chest pain or shortness of breath, no recent sick contacts or exposures.   In ED, pt noted to be hemodynamically stable with VSS, vomiting in ED. [facility] asked to admit for observation.   Assessment/Plan:    Active Problems: Intractable nausea and vomiting - no clear etiology, CT abd negative, ? THC related as pt has known history of THC use - UDS positive for THC - continue IVF, antiemetics as needed - tolerating diet well   HIV - follows with Dr. Linus Salmons  PCM, severe  - underweight - Body mass index is 17.24 kg/(m^2).  Hodgkin's lymphoma - outpatient follow up  Leukopenia - from HIV and lymphoma - s/p on dose of Neuopgen, WBC trending up   Hypokalemia - supplemented prior to discharge, BMP repeated  and noted K 3.4 after supplementing   DVT prophylaxis - Lovenox SQ  Code Status: Full.  Family Communication: plan of care discussed with the patient and her mother  Disposition Plan: Home   IV access:  Peripheral IV  Procedures and diagnostic studies:   Ct Abdomen Pelvis W Contrast 02/08/2015 No evidence of acute abnormality. Unchanged retroperitoneal lymphadenopathy. 3.6 cm right ovarian dermoid. Electronically   Medical Consultants:  None  Other Consultants:  None       Discharge Exam: Filed Vitals:   02/10/15 0440  BP: 113/67  Pulse: 57  Temp: 98.5 F (36.9 C)  Resp: 16   Filed Vitals:   02/09/15 1320 02/09/15 2021 02/09/15 2131 02/10/15 0440  BP: 118/79  127/75 113/67  Pulse: 89 70 52 57  Temp: 98 F (36.7 C)  97.9 F (36.6 C) 98.5 F (36.9 C)  TempSrc: Oral  Oral Oral  Resp: 16  16 16   Height:      Weight:      SpO2: 100%  100% 100%    General: Pt is alert, follows commands appropriately, not in acute distress Cardiovascular: Regular rate and rhythm, S1/S2 +, no murmurs, no rubs, no gallops Respiratory: Clear to auscultation bilaterally, no wheezing, no crackles, no rhonchi Abdominal: Soft, non tender, non distended, bowel sounds +, no guarding  Discharge Instructions  Discharge Instructions    Diet - low sodium heart healthy    Complete by:  As directed      Increase activity slowly    Complete by:  As directed  Medication List    TAKE these medications        atazanavir 300 MG capsule  Commonly known as:  REYATAZ  Take 1 capsule (300 mg total) by mouth daily with breakfast. TAKE WITH NORVIR     ciprofloxacin 500 MG tablet  Commonly known as:  CIPRO  Take 1 tablet (500 mg total) by mouth 2 (two) times daily.     CLEAR EYES OP  Apply 2 drops to eye 3 (three) times daily as needed (redness).     emtricitabine-tenofovir 200-300 MG tablet  Commonly known as:  TRUVADA  Take 1 tablet by mouth daily.      HYDROcodone-acetaminophen 5-325 MG tablet  Commonly known as:  NORCO  Take 1 tablet by mouth every 6 (six) hours as needed.     hydrocortisone cream 0.5 %  Apply 1 application topically 2 (two) times daily.     ibuprofen 200 MG tablet  Commonly known as:  ADVIL,MOTRIN  Take 400 mg by mouth every 6 (six) hours as needed for moderate pain.     ketoconazole 2 % cream  Commonly known as:  NIZORAL  Apply 1 application topically 2 (two) times daily as needed for irritation.     lidocaine-prilocaine cream  Commonly known as:  EMLA  Apply to affected area once     metroNIDAZOLE 500 MG tablet  Commonly known as:  FLAGYL  Take 1 tablet (500 mg total) by mouth 2 (two) times daily.     ondansetron 4 MG tablet  Commonly known as:  ZOFRAN  Take 1 tablet (4 mg total) by mouth every 6 (six) hours.     ondansetron 8 MG disintegrating tablet  Commonly known as:  ZOFRAN ODT  Take 1 tablet (8 mg total) by mouth every 8 (eight) hours as needed for nausea or vomiting.     potassium chloride SA 20 MEQ tablet  Commonly known as:  K-DUR,KLOR-CON  Take 1 tablet (20 mEq total) by mouth 2 (two) times daily.     ritonavir 100 MG Tabs tablet  Commonly known as:  NORVIR  Take 1 tablet (100 mg total) by mouth daily with breakfast. TAKE WITH REYATAZ     traZODone 50 MG tablet  Commonly known as:  DESYREL  Take 50 mg by mouth at bedtime.     WELLBUTRIN XL 150 MG 24 hr tablet  Generic drug:  buPROPion  Take 150 mg by mouth at bedtime.           Follow-up Information    Follow up with Scharlene Gloss, MD.   Specialty:  Infectious Diseases   Contact information:   [address] [place] [address] [phone]       Call Faye Ramsay, MD.   Specialty:  Internal Medicine   Why:  As needed call my cell [phone]   Contact information:   [address] [place] [address] [phone]        The results of significant diagnostics from this  hospitalization (including imaging, microbiology, ancillary and laboratory) are listed below for reference.     Microbiology: Recent Results (from the past 240 hour(s))  Blood culture (routine x 2)     Status: None (Preliminary result)   Collection Time: 02/07/15  4:28 AM  Result Value Ref Range Status   Specimen Description BLOOD RIGHT FOREARM  Final   Special Requests BOTTLES DRAWN AEROBIC ONLY 9ML  Final   Culture   Final  NO GROWTH 2 DAYS Performed at [facility]    Report Status PENDING  Incomplete  Blood culture (routine x 2)     Status: None (Preliminary result)   Collection Time: 02/07/15  4:30 AM  Result Value Ref Range Status   Specimen Description BLOOD RIGHT ANTECUBITAL  Final   Special Requests BOTTLES DRAWN AEROBIC AND ANAEROBIC 7ML  Final   Culture   Final    NO GROWTH 2 DAYS Performed at [facility]    Report Status PENDING  Incomplete     Labs: Basic Metabolic Panel:  Recent Labs Lab 02/07/15 0302 02/08/15 1158 02/09/15 0540 02/10/15 0410  NA 135 138 137 140  K 3.8 3.5 3.7 2.9*  CL 99* 103 104 108  CO2 25 23 25 27   GLUCOSE 106* 76 79 92  BUN 13 8 <5* <5*  CREATININE 0.55 0.51 0.70 0.66  CALCIUM 9.6 8.9 8.5* 8.3*  MG  --   --   --  1.7   Liver Function Tests:  Recent Labs Lab 02/08/15 1158  AST 25  ALT 22  ALKPHOS 66  BILITOT 0.5  PROT 7.1  ALBUMIN 3.9    Recent Labs Lab 02/08/15 1158  LIPASE 16*   CBC:  Recent Labs Lab 02/07/15 0302 02/08/15 1158 02/09/15 0540 02/10/15 0410  WBC 1.6* 1.3* 0.7* 1.8*  NEUTROABS 0.6* 0.2*  --   --   HGB 14.5 12.8 11.8* 11.1*  HCT 41.0 36.5 34.3* 31.8*  MCV 89.1 88.4 88.9 89.6  PLT 260 236 193 202    SIGNED: Time coordinating discharge: 30 minutes  MAGICK-Ricki Vanhandel, MD  [facility] 02/10/2015, 9:04 AM Pager [phone]  If 7PM-7AM, please contact night-coverage [email or web address] Password [email or web address]

## 2015-02-10 NOTE — Progress Notes (Signed)
BMET redrawn post potassium adminstration per MD order. Resulted K 3.4.  MD notified and will proceed with d/c per MD.

## 2015-02-10 NOTE — Progress Notes (Signed)
Initial Nutrition Assessment  DOCUMENTATION CODES:   Severe malnutrition in context of chronic illness, Underweight  INTERVENTION:   D/c Ensure Provide Boost Plus TID, each provides 360 kcal and 14g protein. Provide Magic cup TID with meals, each supplement provides 290 kcal and 9 grams of protein Encourage PO intake RD to continue to monitor  NUTRITION DIAGNOSIS:   Malnutrition related to chronic illness as evidenced by percent weight loss, severe depletion of body fat, severe depletion of muscle mass.  GOAL:   Patient will meet greater than or equal to 90% of their needs  MONITOR:   PO intake, Supplement acceptance, Labs, Weight trends, Skin, I & O's  REASON FOR ASSESSMENT:   Malnutrition Screening Tool, Other (Comment) (Low BMI)    ASSESSMENT:   29 y.o. female with HIV (last CD4 count was 490 in August 2016), Hodgkin's lymphoma, actively undergoing chemotherapy presented to [facility] ED with persistent nausea and non bloody vomiting, associated with abd cramping in epigastric area, worse with eating and drinking and with no specific alleviating factors, no specific radiating symptoms, 5/10 in severity when present and no similar events in the past.   Pt reports good appetite and eating well now. PO intake 100%. PTA pt was not eating for about 2 weeks d/t N/V. Pt states she drinks Boost supplements at home, RD to switch order for Ensure to Boost Plus. Pt would like to try Magic cups as well.   Per weight history, pt has lost 16 lb since May 2016 (14% weight loss x 5 months, significant for time frame).  Nutrition-Focused physical exam completed. Findings are severe fat depletion, severe muscle depletion, and no edema.   Labs reviewed: Low K, BUN  Diet Order:  Diet regular Room service appropriate?: Yes; Fluid consistency:: Thin Diet - low sodium heart healthy  Skin:  Reviewed, no issues  Last BM:  10/16  Height:   Ht Readings from Last 1 Encounters:  02/08/15 5\' 4"   (1.626 m)    Weight:   Wt Readings from Last 1 Encounters:  02/08/15 100 lb 8 oz (45.587 kg)    Ideal Body Weight:  54.5 kg  BMI:  Body mass index is 17.24 kg/(m^2).  Estimated Nutritional Needs:   Kcal:  1400-1600  Protein:  70-80g  Fluid:  1.7L/day  EDUCATION NEEDS:   No education needs identified at this time  Clayton Bibles, MS, RD, LDN Pager: [phone] After Hours Pager: [phone]

## 2015-02-10 NOTE — Progress Notes (Signed)
K+ 2.9 today. Paged NP Kathline Magic via amion this am.

## 2015-02-10 NOTE — Discharge Instructions (Signed)

## 2015-02-10 NOTE — Progress Notes (Signed)
Patient discharged home, all discharge medications and instructions reviewed and questions answered. Patient to be assisted to vehicle by wheelchair.  

## 2015-02-11 ENCOUNTER — Ambulatory Visit (HOSPITAL_BASED_OUTPATIENT_CLINIC_OR_DEPARTMENT_OTHER): Payer: Medicaid Other | Admitting: Hematology and Oncology

## 2015-02-11 ENCOUNTER — Telehealth: Payer: Self-pay | Admitting: *Deleted

## 2015-02-11 ENCOUNTER — Telehealth: Payer: Self-pay | Admitting: Hematology and Oncology

## 2015-02-11 ENCOUNTER — Other Ambulatory Visit (HOSPITAL_COMMUNITY)
Admission: RE | Admit: 2015-02-11 | Discharge: 2015-02-11 | Disposition: A | Discharge status: Home / Self Care | Payer: Medicaid Other | Source: Ambulatory Visit | Attending: Hematology and Oncology | Admitting: Hematology and Oncology

## 2015-02-11 ENCOUNTER — Ambulatory Visit: Payer: Medicaid Other

## 2015-02-11 ENCOUNTER — Other Ambulatory Visit (HOSPITAL_BASED_OUTPATIENT_CLINIC_OR_DEPARTMENT_OTHER): Payer: Medicaid Other

## 2015-02-11 VITALS — BP 130/79 | HR 56 | Temp 98.3°F | Resp 18 | Ht 64.0 in | Wt 99.5 lb

## 2015-02-11 DIAGNOSIS — R197 Diarrhea, unspecified: Secondary | ICD-10-CM | POA: Diagnosis not present

## 2015-02-11 DIAGNOSIS — C8190 Hodgkin lymphoma, unspecified, unspecified site: Secondary | ICD-10-CM | POA: Diagnosis not present

## 2015-02-11 DIAGNOSIS — B20 Human immunodeficiency virus [HIV] disease: Secondary | ICD-10-CM

## 2015-02-11 DIAGNOSIS — C8174 Other classical Hodgkin lymphoma, lymph nodes of axilla and upper limb: Secondary | ICD-10-CM | POA: Diagnosis not present

## 2015-02-11 DIAGNOSIS — Z95828 Presence of other vascular implants and grafts: Secondary | ICD-10-CM

## 2015-02-11 DIAGNOSIS — A09 Infectious gastroenteritis and colitis, unspecified: Secondary | ICD-10-CM

## 2015-02-11 LAB — CBC WITH DIFFERENTIAL/PLATELET
BASO%: 0.5 % (ref 0.0–2.0)
BASOS ABS: 0 10*3/uL (ref 0.0–0.1)
EOS%: 1.1 % (ref 0.0–7.0)
Eosinophils Absolute: 0.1 10*3/uL (ref 0.0–0.5)
HCT: 37.4 % (ref 34.8–46.6)
HEMOGLOBIN: 13 g/dL (ref 11.6–15.9)
LYMPH%: 21 % (ref 14.0–49.7)
MCH: 31.1 pg (ref 25.1–34.0)
MCHC: 34.7 g/dL (ref 31.5–36.0)
MCV: 89.7 fL (ref 79.5–101.0)
MONO#: 1.5 10*3/uL — ABNORMAL HIGH (ref 0.1–0.9)
MONO%: 21.1 % — AB (ref 0.0–14.0)
NEUT#: 4.1 10*3/uL (ref 1.5–6.5)
NEUT%: 56.3 % (ref 38.4–76.8)
Platelets: 218 10*3/uL (ref 145–400)
RBC: 4.17 10*6/uL (ref 3.70–5.45)
RDW: 13.9 % (ref 11.2–14.5)
WBC: 7.3 10*3/uL (ref 3.9–10.3)
lymph#: 1.5 10*3/uL (ref 0.9–3.3)

## 2015-02-11 LAB — COMPREHENSIVE METABOLIC PANEL (CC13)
ALBUMIN: 3.5 g/dL (ref 3.5–5.0)
ALT: 17 U/L (ref 0–55)
AST: 17 U/L (ref 5–34)
Alkaline Phosphatase: 70 U/L (ref 40–150)
Anion Gap: 11 mEq/L (ref 3–11)
CHLORIDE: 106 meq/L (ref 98–109)
CO2: 24 meq/L (ref 22–29)
Calcium: 9.3 mg/dL (ref 8.4–10.4)
Creatinine: 0.7 mg/dL (ref 0.6–1.1)
EGFR: >90 mL/min/{1.73_m2} (ref >90)
GLUCOSE: 83 mg/dL (ref 70–140)
POTASSIUM: 3.6 meq/L (ref 3.5–5.1)
Sodium: 141 mEq/L (ref 136–145)
Total Bilirubin: 1 mg/dL (ref 0.20–1.20)
Total Protein: 6.8 g/dL (ref 6.4–8.3)

## 2015-02-11 LAB — PREGNANCY, URINE: Preg Test, Ur: NEGATIVE

## 2015-02-11 MED ORDER — SODIUM CHLORIDE 0.9 % IJ SOLN
10.0000 mL | INTRAMUSCULAR | Status: DC | PRN
  Administered 2015-02-11: 10 mL via INTRAVENOUS
  Filled 2015-02-11: qty 10

## 2015-02-11 MED ORDER — HEPARIN SOD (PORK) LOCK FLUSH 100 UNIT/ML IV SOLN
500.0000 [IU] | Freq: Once | INTRAVENOUS | Status: AC
  Administered 2015-02-11: 500 [IU] via INTRAVENOUS
  Filled 2015-02-11: qty 5

## 2015-02-11 NOTE — Telephone Encounter (Signed)
Per staff message and POF I have scheduled appts. Advised scheduler of appts. JMW  

## 2015-02-11 NOTE — Assessment & Plan Note (Signed)
The diarrhea is likely infectious in origin. She is improving on ciprofloxacin. I recommend holding treatment and rescheduled to 2 weeks.

## 2015-02-11 NOTE — Telephone Encounter (Signed)
Called patient about pcp referral and advised her to see whom is on her card or she can call her case worker at [facility] for another phy

## 2015-02-11 NOTE — Patient Instructions (Signed)

## 2015-02-11 NOTE — Progress Notes (Signed)
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   05/07/2014 Imaging CT scan of the chest show right thoracic inlet lymphadenopathy   06/07/2014 Procedure She underwent ultrasound-guided core biopsy of the neck lymph node   06/07/2014 Pathology Results Accession: [id number] biopsy confirmed diagnosis of Hodgkin lymphoma.   06/15/2014 Imaging Echocardiogram showed preserved ejection fraction   07/09/2014 - 07/12/2014 Hospital Admission She was admitted to the hospital for severe anemia.   07/27/2014 Procedure She had placement of port   07/31/2014 - 09/11/2014 Chemotherapy She received dose adjusted chemotherapy due to abnormal liver function tests and severe anemia. Treatment was delayed due to noncompliance  and subsequently stopped because the patient failed to keep appointments   01/11/2015 Imaging Repeat PET CT scan showed response to treatment   01/28/2015 -  Chemotherapy ABVD was restarted with full dose.   02/08/2015 - 02/10/2015 Hospital Admission The patient was admitted to the hospital due to pancytopenia and profuse diarrhea. Cultures were negative. She was placed on ciprofloxacin.   02/11/2015 Adverse Reaction Treatment was placed on hold due to recent infection.    INTERVAL HISTORY: Please see below for problem oriented charting. She is seen prior to chemotherapy. She had profuse diarrhea and abdominal recently. She was briefly admitted to the hospital with pancytopenia and was just discharged yesterday. This morning, she continues to have profuse diarrhea 2. Abdominal pain is resolving  and she started eating. She does not have a primary care doctor for general medical issues.  REVIEW OF SYSTEMS:   Constitutional: Denies fevers, chills or abnormal weight loss Eyes: Denies blurriness of vision Ears, nose, mouth, throat, and face: Denies mucositis or sore throat Respiratory: Denies cough, dyspnea or wheezes Cardiovascular: Denies palpitation, chest discomfort or lower extremity swelling Skin: Denies abnormal skin rashes Lymphatics: Denies new lymphadenopathy or easy bruising Neurological:Denies numbness, tingling or new weaknesses Behavioral/Psych: Mood is stable, no new changes  All other systems were reviewed with the patient and are negative.  I have reviewed the past medical history, past surgical history, social history and family history with the patient and they are unchanged from previous note.  ALLERGIES:  has No Known Allergies.  MEDICATIONS:  Current Outpatient Prescriptions  Medication Sig Dispense Refill  . atazanavir (REYATAZ) 300 MG capsule Take 1 capsule (300 mg total) by mouth daily with breakfast. TAKE WITH NORVIR 30 capsule 5  . buPROPion (WELLBUTRIN XL) 150 MG 24 hr tablet Take 150 mg by mouth at bedtime.     . ciprofloxacin (CIPRO) 500 MG tablet Take 1 tablet (500 mg total) by mouth 2 (two) times daily. 28 tablet 0  . emtricitabine-tenofovir (TRUVADA) 200-300 MG per tablet Take 1 tablet by mouth daily. 30 tablet 5  . HYDROcodone-acetaminophen (NORCO) 5-325 MG tablet Take 1 tablet by mouth every 6 (six) hours as needed. 20 tablet 0  . hydrocortisone cream 0.5 % Apply 1 application topically 2 (two) times daily. 30 g 1  . ibuprofen (ADVIL,MOTRIN) 200 MG tablet Take 400 mg by mouth every 6 (six) hours as needed for moderate pain.    Marland Kitchen ketoconazole (NIZORAL) 2 % cream Apply 1 application  topically 2 (two) times daily as needed for irritation. 60 g 3  . lidocaine-prilocaine (EMLA) cream Apply to affected area once 30 g 3  . metroNIDAZOLE (FLAGYL) 500 MG  tablet Take 1 tablet (500 mg total) by mouth 2 (two) times daily. 14 tablet 0  . Naphazoline HCl (CLEAR EYES OP) Apply 2 drops to eye 3 (three) times daily as needed (redness).     . ondansetron (ZOFRAN ODT) 8 MG disintegrating tablet Take 1 tablet (8 mg total) by mouth every 8 (eight) hours as needed for nausea or vomiting. 15 tablet 0  . ondansetron (ZOFRAN) 4 MG tablet Take 1 tablet (4 mg total) by mouth every 6 (six) hours. 15 tablet 0  . potassium chloride SA (K-DUR,KLOR-CON) 20 MEQ tablet Take 1 tablet (20 mEq total) by mouth 2 (two) times daily. 60 tablet 0  . ritonavir (NORVIR) 100 MG TABS tablet Take 1 tablet (100 mg total) by mouth daily with breakfast. TAKE WITH REYATAZ 30 tablet 5  . traZODone (DESYREL) 50 MG tablet Take 50 mg by mouth at bedtime.     Current Facility-Administered Medications  Medication Dose Route Frequency Provider Last Rate Last Dose  . sodium chloride 0.9 % injection 10 mL  10 mL Intravenous PRN Heath Lark, MD   10 mL at 02/11/15 0846    PHYSICAL EXAMINATION: ECOG PERFORMANCE STATUS: 1 - Symptomatic but completely ambulatory  Filed Vitals:   02/11/15 0822  BP: 130/79  Pulse: 56  Temp: 98.3 F (36.8 C)  Resp: 18   Filed Weights   02/11/15 0822  Weight: 99 lb 8 oz (45.133 kg)    GENERAL:alert, no distress and comfortable. She looks thin and cachectic SKIN: skin color, texture, turgor are normal, no rashes or significant lesions EYES: normal, Conjunctiva are pink and non-injected, sclera clear OROPHARYNX:no exudate, no erythema and lips, buccal mucosa, and tongue normal  Musculoskeletal:no cyanosis of digits and no clubbing  NEURO: alert & oriented x 3 with fluent speech, no focal motor/sensory deficits  LABORATORY DATA:  I have reviewed the data as listed    Component Value Date/Time   NA 141 02/11/2015 0757   NA 139 02/10/2015 1202   K 3.6 02/11/2015 0757   K 3.4* 02/10/2015 1202   CL 108 02/10/2015 1202   CO2 24 02/11/2015 0757   CO2 26  02/10/2015 1202   GLUCOSE 83 02/11/2015 0757   GLUCOSE 81 02/10/2015 1202   BUN <4.0* 02/11/2015 0757   BUN <5* 02/10/2015 1202   CREATININE 0.7 02/11/2015 0757   CREATININE 0.77 02/10/2015 1202   CREATININE 0.66 12/04/2014 1014   CALCIUM 9.3 02/11/2015 0757   CALCIUM 8.4* 02/10/2015 1202   PROT 6.8 02/11/2015 0757   PROT 7.1 02/08/2015 1158   ALBUMIN 3.5 02/11/2015 0757   ALBUMIN 3.9 02/08/2015 1158   AST 17 02/11/2015 0757   AST 25 02/08/2015 1158   ALT 17 02/11/2015 0757   ALT 22 02/08/2015 1158   ALKPHOS 70 02/11/2015 0757   ALKPHOS 66 02/08/2015 1158   BILITOT 1.00 02/11/2015 0757   BILITOT 0.5 02/08/2015 1158   GFRNONAA >60 02/10/2015 1202   GFRNONAA >89 12/04/2014 1014   GFRAA >60 02/10/2015 1202   GFRAA >89 12/04/2014 1014    No results found for: SPEP, UPEP  Lab Results  Component Value Date   WBC 7.3 02/11/2015   NEUTROABS 4.1 02/11/2015   HGB 13.0 02/11/2015   HCT 37.4 02/11/2015   MCV 89.7 02/11/2015   PLT 218  02/11/2015      Chemistry      Component Value Date/Time   NA 141 02/11/2015 0757   NA 139 02/10/2015 1202   K 3.6 02/11/2015 0757   K 3.4* 02/10/2015 1202   CL 108 02/10/2015 1202   CO2 24 02/11/2015 0757   CO2 26 02/10/2015 1202   BUN <4.0* 02/11/2015 0757   BUN <5* 02/10/2015 1202   CREATININE 0.7 02/11/2015 0757   CREATININE 0.77 02/10/2015 1202   CREATININE 0.66 12/04/2014 1014      Component Value Date/Time   CALCIUM 9.3 02/11/2015 0757   CALCIUM 8.4* 02/10/2015 1202   ALKPHOS 70 02/11/2015 0757   ALKPHOS 66 02/08/2015 1158   AST 17 02/11/2015 0757   AST 25 02/08/2015 1158   ALT 17 02/11/2015 0757   ALT 22 02/08/2015 1158   BILITOT 1.00 02/11/2015 0757   BILITOT 0.5 02/08/2015 1158      ASSESSMENT & PLAN:  Other classical hodgkin lymphoma, 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 She has lost a lot of weight. I do not feel comfortable  prescribing chemotherapy today. I will reschedule to 2 weeks.  Diarrhea The diarrhea is likely infectious in origin. She is improving on ciprofloxacin. I recommend holding treatment and rescheduled to 2 weeks.    Orders Placed This Encounter  Procedures  . Ambulatory referral to Internal Medicine    Referral Priority:  Routine    Referral Type:  Consultation    Referral Reason:  Specialty Services Required    Requested Specialty:  Internal Medicine    Number of Visits Requested:  1   All questions were answered. The patient knows to call the clinic with any problems, questions or concerns. No barriers to learning was detected. I spent 15 minutes counseling the patient face to face. The total time spent in the appointment was 20 minutes and more than 50% was on counseling and review of test results     [facility], [place], MD 02/11/2015 8:48 AM

## 2015-02-11 NOTE — Telephone Encounter (Signed)
perpof to sch pt appt-gave pt copy of avs-sent MW email to move trmt to coordinate w/MD appt

## 2015-02-11 NOTE — Assessment & Plan Note (Addendum)
Even though her blood count has recovered, she continues to have profuse diarrhea. She has lost a lot of weight. I do not feel comfortable prescribing chemotherapy today. I will reschedule to 2 weeks.

## 2015-02-12 LAB — CULTURE, BLOOD (ROUTINE X 2)
CULTURE: NO GROWTH
Culture: NO GROWTH

## 2015-02-25 ENCOUNTER — Ambulatory Visit (HOSPITAL_BASED_OUTPATIENT_CLINIC_OR_DEPARTMENT_OTHER): Payer: Medicaid Other | Admitting: Hematology and Oncology

## 2015-02-25 ENCOUNTER — Telehealth: Payer: Self-pay | Admitting: Hematology and Oncology

## 2015-02-25 ENCOUNTER — Other Ambulatory Visit: Payer: Self-pay | Admitting: Internal Medicine

## 2015-02-25 ENCOUNTER — Other Ambulatory Visit: Payer: Medicaid Other

## 2015-02-25 ENCOUNTER — Other Ambulatory Visit (HOSPITAL_COMMUNITY)
Admission: RE | Admit: 2015-02-25 | Discharge: 2015-02-25 | Disposition: A | Discharge status: Home / Self Care | Payer: Medicaid Other | Source: Ambulatory Visit | Attending: Hematology and Oncology | Admitting: Hematology and Oncology

## 2015-02-25 ENCOUNTER — Other Ambulatory Visit (HOSPITAL_BASED_OUTPATIENT_CLINIC_OR_DEPARTMENT_OTHER): Payer: Medicaid Other

## 2015-02-25 ENCOUNTER — Ambulatory Visit (HOSPITAL_BASED_OUTPATIENT_CLINIC_OR_DEPARTMENT_OTHER): Payer: Medicaid Other

## 2015-02-25 ENCOUNTER — Ambulatory Visit: Payer: Medicaid Other

## 2015-02-25 VITALS — BP 107/67 | HR 66 | Temp 98.2°F | Resp 18 | Ht 64.0 in | Wt 105.8 lb

## 2015-02-25 DIAGNOSIS — C8190 Hodgkin lymphoma, unspecified, unspecified site: Secondary | ICD-10-CM

## 2015-02-25 DIAGNOSIS — R197 Diarrhea, unspecified: Secondary | ICD-10-CM

## 2015-02-25 DIAGNOSIS — Z5111 Encounter for antineoplastic chemotherapy: Secondary | ICD-10-CM

## 2015-02-25 DIAGNOSIS — Z91199 Patient's noncompliance with other medical treatment and regimen due to unspecified reason: Secondary | ICD-10-CM

## 2015-02-25 DIAGNOSIS — C8174 Other classical Hodgkin lymphoma, lymph nodes of axilla and upper limb: Secondary | ICD-10-CM

## 2015-02-25 DIAGNOSIS — B20 Human immunodeficiency virus [HIV] disease: Secondary | ICD-10-CM | POA: Diagnosis not present

## 2015-02-25 DIAGNOSIS — C8140 Lymphocyte-rich classical Hodgkin lymphoma, unspecified site: Secondary | ICD-10-CM

## 2015-02-25 DIAGNOSIS — Z9119 Patient's noncompliance with other medical treatment and regimen: Secondary | ICD-10-CM

## 2015-02-25 LAB — COMPREHENSIVE METABOLIC PANEL (CC13)
ALBUMIN: 3.9 g/dL (ref 3.5–5.0)
ALK PHOS: 69 U/L (ref 40–150)
ALT: 16 U/L (ref 0–55)
AST: 18 U/L (ref 5–34)
Anion Gap: 7 mEq/L (ref 3–11)
BUN: 9.3 mg/dL (ref 7.0–26.0)
CALCIUM: 10.2 mg/dL (ref 8.4–10.4)
CO2: 26 mEq/L (ref 22–29)
CREATININE: 0.7 mg/dL (ref 0.6–1.1)
Chloride: 110 mEq/L — ABNORMAL HIGH (ref 98–109)
EGFR: >90 mL/min/{1.73_m2} (ref >90)
Glucose: 84 mg/dl (ref 70–140)
Potassium: 4.3 mEq/L (ref 3.5–5.1)
Sodium: 143 mEq/L (ref 136–145)
Total Bilirubin: 0.58 mg/dL (ref 0.20–1.20)
Total Protein: 7.2 g/dL (ref 6.4–8.3)

## 2015-02-25 LAB — CBC WITH DIFFERENTIAL/PLATELET
BASO%: 1.2 % (ref 0.0–2.0)
BASOS ABS: 0.1 10*3/uL (ref 0.0–0.1)
EOS%: 1.1 % (ref 0.0–7.0)
Eosinophils Absolute: 0.1 10*3/uL (ref 0.0–0.5)
HEMATOCRIT: 39.3 % (ref 34.8–46.6)
HEMOGLOBIN: 13.4 g/dL (ref 11.6–15.9)
LYMPH#: 1.5 10*3/uL (ref 0.9–3.3)
LYMPH%: 26.2 % (ref 14.0–49.7)
MCH: 31.6 pg (ref 25.1–34.0)
MCHC: 34 g/dL (ref 31.5–36.0)
MCV: 92.9 fL (ref 79.5–101.0)
MONO#: 0.4 10*3/uL (ref 0.1–0.9)
MONO%: 6.7 % (ref 0.0–14.0)
NEUT#: 3.6 10*3/uL (ref 1.5–6.5)
NEUT%: 64.8 % (ref 38.4–76.8)
PLATELETS: 293 10*3/uL (ref 145–400)
RBC: 4.23 10*6/uL (ref 3.70–5.45)
RDW: 15.4 % — AB (ref 11.2–14.5)
WBC: 5.6 10*3/uL (ref 3.9–10.3)

## 2015-02-25 LAB — LACTATE DEHYDROGENASE (CC13): LDH: 153 U/L (ref 125–245)

## 2015-02-25 LAB — PREGNANCY, URINE: Preg Test, Ur: NEGATIVE

## 2015-02-25 MED ORDER — SODIUM CHLORIDE 0.9 % IV SOLN
Freq: Once | INTRAVENOUS | Status: AC
  Administered 2015-02-25: 10:00:00 via INTRAVENOUS

## 2015-02-25 MED ORDER — SODIUM CHLORIDE 0.9 % IV SOLN
Freq: Once | INTRAVENOUS | Status: AC
  Administered 2015-02-25: 13:00:00 via INTRAVENOUS
  Filled 2015-02-25: qty 8

## 2015-02-25 MED ORDER — SODIUM CHLORIDE 0.9 % IV SOLN
300.0000 mg/m2 | Freq: Once | INTRAVENOUS | Status: AC
  Administered 2015-02-25: 460 mg via INTRAVENOUS
  Filled 2015-02-25: qty 23

## 2015-02-25 MED ORDER — SODIUM CHLORIDE 0.9 % IJ SOLN
10.0000 mL | INTRAMUSCULAR | Status: DC | PRN
  Administered 2015-02-25: 10 mL via INTRAVENOUS
  Filled 2015-02-25: qty 10

## 2015-02-25 MED ORDER — VINBLASTINE SULFATE CHEMO INJECTION 1 MG/ML
4.5000 mg/m2 | Freq: Once | INTRAVENOUS | Status: AC
  Administered 2015-02-25: 7 mg via INTRAVENOUS
  Filled 2015-02-25: qty 7

## 2015-02-25 MED ORDER — LEUPROLIDE ACETATE 7.5 MG IM KIT
7.5000 mg | PACK | Freq: Once | INTRAMUSCULAR | Status: AC
  Administered 2015-02-25: 7.5 mg via INTRAMUSCULAR
  Filled 2015-02-25: qty 7.5

## 2015-02-25 MED ORDER — HEPARIN SOD (PORK) LOCK FLUSH 100 UNIT/ML IV SOLN
500.0000 [IU] | Freq: Once | INTRAVENOUS | Status: AC | PRN
  Administered 2015-02-25: 500 [IU]
  Filled 2015-02-25: qty 5

## 2015-02-25 MED ORDER — HEPARIN SOD (PORK) LOCK FLUSH 100 UNIT/ML IV SOLN
500.0000 [IU] | Freq: Once | INTRAVENOUS | Status: DC
  Filled 2015-02-25: qty 5

## 2015-02-25 MED ORDER — SODIUM CHLORIDE 0.9 % IV SOLN
8.0000 [IU]/m2 | Freq: Once | INTRAVENOUS | Status: AC
  Administered 2015-02-25: 12 [IU] via INTRAVENOUS
  Filled 2015-02-25: qty 4

## 2015-02-25 MED ORDER — DOXORUBICIN HCL CHEMO IV INJECTION 2 MG/ML
20.0000 mg/m2 | Freq: Once | INTRAVENOUS | Status: AC
  Administered 2015-02-25: 32 mg via INTRAVENOUS
  Filled 2015-02-25: qty 16

## 2015-02-25 MED ORDER — SODIUM CHLORIDE 0.9 % IJ SOLN
10.0000 mL | INTRAMUSCULAR | Status: DC | PRN
  Administered 2015-02-25: 10 mL
  Filled 2015-02-25: qty 10

## 2015-02-25 NOTE — Patient Instructions (Signed)
[facility] Discharge Instructions for Patients Receiving Chemotherapy  Today you received the following chemotherapy agents Adriamycin/Vinblastine/Bleomycin/Decarbazine.  To help prevent nausea and vomiting after your treatment, we encourage you to take your nausea medication as directed.   If you develop nausea and vomiting that is not controlled by your nausea medication, call the clinic.   BELOW ARE SYMPTOMS THAT SHOULD BE REPORTED IMMEDIATELY:  *FEVER GREATER THAN 100.5 F  *CHILLS WITH OR WITHOUT FEVER  NAUSEA AND VOMITING THAT IS NOT CONTROLLED WITH YOUR NAUSEA MEDICATION  *UNUSUAL SHORTNESS OF BREATH  *UNUSUAL BRUISING OR BLEEDING  TENDERNESS IN MOUTH AND THROAT WITH OR WITHOUT PRESENCE OF ULCERS  *URINARY PROBLEMS  *BOWEL PROBLEMS  UNUSUAL RASH Items with * indicate a potential emergency and should be followed up as soon as possible.  Feel free to call the clinic you have any questions or concerns. The clinic phone number is [phone].  Please show the [place] at check-in to the Emergency Department and triage nurse.

## 2015-02-25 NOTE — Assessment & Plan Note (Signed)
The diarrhea is likely infectious in origin, resolving This was thought to be related to recent treatment as well As above, I plan to reduce the dose of treatment.

## 2015-02-25 NOTE — Patient Instructions (Signed)

## 2015-02-25 NOTE — Assessment & Plan Note (Signed)
The patient has significant medical noncompliance and had missed many appointments and rescheduled many appointments in the past. She was discharged from my service but recently she repented and promised that she will be compliant in the future.  I also got verbal confirmation from the patient's mother that she will also make sure that the patient keeps all her appointment in the future 

## 2015-02-25 NOTE — Telephone Encounter (Signed)
Not able to reach patient or leave message re 11/28 apoointments. Schedule mailed. Other appointments remain the same.

## 2015-02-25 NOTE — Assessment & Plan Note (Signed)
She continues taking anti-retroviral treatment. Again, I reinforced the importance of her keeping her appointments with infectious disease. She stated that she will be compliant taking her anti-retroviral treatment in the future 

## 2015-02-25 NOTE — Assessment & Plan Note (Signed)
With her last dose of treatment, I increase her back to full doses due to improve mild liver function tests. Unfortunately, she developed infectious complication and diarrhea. Her symptoms are almost back to baseline but she still had persistent watery diarrhea twice a day. I prefer to reduce all her chemotherapy by 20% due to recent side effects. Clinically, she appears to be responding to treatment with resolution of some of the palpable lymphadenopathy on her left neck.

## 2015-02-25 NOTE — Progress Notes (Signed)
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   05/07/2014 Imaging CT scan of the chest show right thoracic inlet lymphadenopathy   06/07/2014 Procedure She underwent ultrasound-guided core biopsy of the neck lymph node   06/07/2014 Pathology Results Accession: [id number] biopsy confirmed diagnosis of Hodgkin lymphoma.   06/15/2014 Imaging Echocardiogram showed preserved ejection fraction   07/09/2014 - 07/12/2014 Hospital Admission She was admitted to the hospital for severe anemia.   07/27/2014 Procedure She had placement of port   07/31/2014 - 09/11/2014 Chemotherapy She received dose adjusted chemotherapy due to abnormal liver function tests and severe anemia. Treatment was delayed due to noncompliance  and subsequently stopped because the patient failed to keep appointments   01/11/2015 Imaging Repeat PET CT scan showed response to treatment   01/28/2015 -  Chemotherapy ABVD was restarted with full dose.   02/08/2015 - 02/10/2015 Hospital Admission The patient was admitted to the hospital due to pancytopenia and profuse diarrhea. Cultures were negative. She was placed on ciprofloxacin.   02/11/2015 Adverse Reaction Treatment was placed on hold due to recent infection.    INTERVAL HISTORY: Please see below for problem oriented charting. She returns for further follow-up. She felt better. Diarrhea is resolving but she still has 1-2 watery bowel movements per day. Denies further recent fevers or chills.  REVIEW OF SYSTEMS:   Constitutional: Denies fevers, chills or abnormal weight loss Eyes:  Denies blurriness of vision Ears, nose, mouth, throat, and face: Denies mucositis or sore throat Respiratory: Denies cough, dyspnea or wheezes Cardiovascular: Denies palpitation, chest discomfort or lower extremity swelling Skin: Denies abnormal skin rashes Lymphatics: Denies new lymphadenopathy or easy bruising Neurological:Denies numbness, tingling or new weaknesses Behavioral/Psych: Mood is stable, no new changes  All other systems were reviewed with the patient and are negative.  I have reviewed the past medical history, past surgical history, social history and family history with the patient and they are unchanged from previous note.  ALLERGIES:  has No Known Allergies.  MEDICATIONS:  Current Outpatient Prescriptions  Medication Sig Dispense Refill  . atazanavir (REYATAZ) 300 MG capsule Take 1 capsule (300 mg total) by mouth daily with breakfast. TAKE WITH NORVIR 30 capsule 5  . buPROPion (WELLBUTRIN XL) 150 MG 24 hr tablet Take 150 mg by mouth at bedtime.     . ciprofloxacin (CIPRO) 500 MG tablet Take 1 tablet (500 mg total) by mouth 2 (two) times daily. 28 tablet 0  . emtricitabine-tenofovir (TRUVADA) 200-300 MG per tablet Take 1 tablet by mouth daily. 30 tablet 5  . HYDROcodone-acetaminophen (NORCO) 5-325 MG tablet Take 1 tablet by mouth every 6 (six) hours as needed. 20 tablet 0  . hydrocortisone cream 0.5 % Apply 1 application topically 2 (two) times daily. 30 g 1  . ibuprofen (ADVIL,MOTRIN) 200 MG tablet Take 400 mg by mouth every 6 (six) hours as needed for moderate pain.    Marland Kitchen ketoconazole (NIZORAL) 2 % cream Apply 1 application topically 2 (two) times daily as needed for irritation. 60 g 3  . lidocaine-prilocaine (EMLA) cream Apply to affected area once 30 g 3  . metroNIDAZOLE (FLAGYL)  500 MG tablet Take 1 tablet (500 mg total) by mouth 2 (two) times daily. 14 tablet 0  . Naphazoline HCl (CLEAR EYES OP) Apply 2 drops to eye 3 (three) times daily as needed (redness).     .  ondansetron (ZOFRAN ODT) 8 MG disintegrating tablet Take 1 tablet (8 mg total) by mouth every 8 (eight) hours as needed for nausea or vomiting. 15 tablet 0  . ondansetron (ZOFRAN) 4 MG tablet Take 1 tablet (4 mg total) by mouth every 6 (six) hours. 15 tablet 0  . potassium chloride SA (K-DUR,KLOR-CON) 20 MEQ tablet Take 1 tablet (20 mEq total) by mouth 2 (two) times daily. 60 tablet 0  . ritonavir (NORVIR) 100 MG TABS tablet Take 1 tablet (100 mg total) by mouth daily with breakfast. TAKE WITH REYATAZ 30 tablet 5  . traZODone (DESYREL) 50 MG tablet Take 50 mg by mouth at bedtime.     No current facility-administered medications for this visit.    PHYSICAL EXAMINATION: ECOG PERFORMANCE STATUS: 1 - Symptomatic but completely ambulatory  Filed Vitals:   02/25/15 1005  BP: 107/67  Pulse: 66  Temp: 98.2 F (36.8 C)  Resp: 18   Filed Weights   02/25/15 1005  Weight: 105 lb 12.8 oz (47.991 kg)    GENERAL:alert, no distress and comfortable SKIN: skin color, texture, turgor are normal, no rashes or significant lesions EYES: normal, Conjunctiva are pink and non-injected, sclera clear OROPHARYNX:no exudate, no erythema and lips, buccal mucosa, and tongue normal  NECK: supple, thyroid normal size, non-tender, without nodularity LYMPH:  She has persistent lymphadenopathy in her neck, improved compared to prior exam  LUNGS: clear to auscultation and percussion with normal breathing effort HEART: regular rate & rhythm and no murmurs and no lower extremity edema ABDOMEN:abdomen soft, non-tender and normal bowel sounds Musculoskeletal:no cyanosis of digits and no clubbing  NEURO: alert & oriented x 3 with fluent speech, no focal motor/sensory deficits  LABORATORY DATA:  I have reviewed the data as listed    Component Value Date/Time   NA 141 02/11/2015 0757   NA 139 02/10/2015 1202   K 3.6 02/11/2015 0757   K 3.4* 02/10/2015 1202   CL 108 02/10/2015 1202   CO2 24 02/11/2015 0757   CO2  26 02/10/2015 1202   GLUCOSE 83 02/11/2015 0757   GLUCOSE 81 02/10/2015 1202   BUN <4.0* 02/11/2015 0757   BUN <5* 02/10/2015 1202   CREATININE 0.7 02/11/2015 0757   CREATININE 0.77 02/10/2015 1202   CREATININE 0.66 12/04/2014 1014   CALCIUM 9.3 02/11/2015 0757   CALCIUM 8.4* 02/10/2015 1202   PROT 6.8 02/11/2015 0757   PROT 7.1 02/08/2015 1158   ALBUMIN 3.5 02/11/2015 0757   ALBUMIN 3.9 02/08/2015 1158   AST 17 02/11/2015 0757   AST 25 02/08/2015 1158   ALT 17 02/11/2015 0757   ALT 22 02/08/2015 1158   ALKPHOS 70 02/11/2015 0757   ALKPHOS 66 02/08/2015 1158   BILITOT 1.00 02/11/2015 0757   BILITOT 0.5 02/08/2015 1158   GFRNONAA >60 02/10/2015 1202   GFRNONAA >89 12/04/2014 1014   GFRAA >60 02/10/2015 1202   GFRAA >89 12/04/2014 1014    No results found for: SPEP, UPEP  Lab Results  Component Value Date   WBC 5.6 02/25/2015   NEUTROABS 3.6 02/25/2015   HGB 13.4 02/25/2015   HCT 39.3 02/25/2015   MCV 92.9 02/25/2015   PLT 293 02/25/2015      Chemistry  Component Value Date/Time   NA 141 02/11/2015 0757   NA 139 02/10/2015 1202   K 3.6 02/11/2015 0757   K 3.4* 02/10/2015 1202   CL 108 02/10/2015 1202   CO2 24 02/11/2015 0757   CO2 26 02/10/2015 1202   BUN <4.0* 02/11/2015 0757   BUN <5* 02/10/2015 1202   CREATININE 0.7 02/11/2015 0757   CREATININE 0.77 02/10/2015 1202   CREATININE 0.66 12/04/2014 1014      Component Value Date/Time   CALCIUM 9.3 02/11/2015 0757   CALCIUM 8.4* 02/10/2015 1202   ALKPHOS 70 02/11/2015 0757   ALKPHOS 66 02/08/2015 1158   AST 17 02/11/2015 0757   AST 25 02/08/2015 1158   ALT 17 02/11/2015 0757   ALT 22 02/08/2015 1158   BILITOT 1.00 02/11/2015 0757   BILITOT 0.5 02/08/2015 1158      ASSESSMENT & PLAN:  Other classical hodgkin lymphoma, 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 Unfortunately, she developed  infectious complication and diarrhea. Her symptoms are almost back to baseline but she still had persistent watery diarrhea twice a day. I prefer to reduce all her chemotherapy by 20% due to recent side effects. Clinically, she appears to be responding to treatment with resolution of some of the palpable lymphadenopathy on her left neck.  Human immunodeficiency virus (HIV) disease ([place]) She continues taking anti-retroviral treatment. Again, I reinforced the importance of her keeping her appointments with infectious disease. She stated that she will be compliant taking her anti-retroviral treatment in the future    Diarrhea The diarrhea is likely infectious in origin, resolving This was thought to be related to recent treatment as well As above, I plan to reduce the dose of treatment.    H/O noncompliance with medical treatment, presenting hazards to health The patient has significant medical noncompliance and had missed many appointments and rescheduled many appointments in the past. She was discharged from my service but recently she repented and promised that she will be compliant in the future.  I also got verbal confirmation from the patient's mother that she will also make sure that the patient keeps all her appointment in the future       No orders of the defined types were placed in this encounter.   All questions were answered. The patient knows to call the clinic with any problems, questions or concerns. No barriers to learning was detected. I spent 25 minutes counseling the patient face to face. The total time spent in the appointment was 40 minutes and more than 50% was on counseling and review of test results     [facility], [place], MD 02/25/2015 10:11 AM

## 2015-02-26 ENCOUNTER — Telehealth: Payer: Self-pay | Admitting: *Deleted

## 2015-02-26 NOTE — Telephone Encounter (Signed)
FYI Teliephone call from patient received reporting she "lost her grillz.  Asking if her lost gold teeth grillz or fronts was found in her chair or a treatment room bathroom yesterday."  Describes as "four gold teeth."  Called treatment room, nothing found per charge nurse but if found will call patient.  This nurse provided [place] number [phone] for her to call in case she lost in another area.  Was scheduled for lab/flush/MD/infusion yesterday.

## 2015-03-11 ENCOUNTER — Other Ambulatory Visit (HOSPITAL_BASED_OUTPATIENT_CLINIC_OR_DEPARTMENT_OTHER): Payer: Medicaid Other

## 2015-03-11 ENCOUNTER — Ambulatory Visit (HOSPITAL_BASED_OUTPATIENT_CLINIC_OR_DEPARTMENT_OTHER): Payer: Medicaid Other

## 2015-03-11 ENCOUNTER — Ambulatory Visit: Payer: Medicaid Other

## 2015-03-11 ENCOUNTER — Other Ambulatory Visit (HOSPITAL_COMMUNITY)
Admission: RE | Admit: 2015-03-11 | Discharge: 2015-03-11 | Disposition: A | Discharge status: Home / Self Care | Payer: Medicaid Other | Source: Ambulatory Visit | Attending: Hematology and Oncology | Admitting: Hematology and Oncology

## 2015-03-11 VITALS — BP 98/67 | HR 57 | Temp 98.7°F | Resp 18

## 2015-03-11 DIAGNOSIS — C8190 Hodgkin lymphoma, unspecified, unspecified site: Secondary | ICD-10-CM

## 2015-03-11 DIAGNOSIS — Z95828 Presence of other vascular implants and grafts: Secondary | ICD-10-CM

## 2015-03-11 DIAGNOSIS — C8174 Other classical Hodgkin lymphoma, lymph nodes of axilla and upper limb: Secondary | ICD-10-CM | POA: Diagnosis not present

## 2015-03-11 DIAGNOSIS — Z5111 Encounter for antineoplastic chemotherapy: Secondary | ICD-10-CM

## 2015-03-11 LAB — COMPREHENSIVE METABOLIC PANEL (CC13)
ALBUMIN: 3.5 g/dL (ref 3.5–5.0)
ALK PHOS: 72 U/L (ref 40–150)
ALT: 14 U/L (ref 0–55)
ANION GAP: 9 meq/L (ref 3–11)
AST: 14 U/L (ref 5–34)
BILIRUBIN TOTAL: 0.9 mg/dL (ref 0.20–1.20)
BUN: 9.7 mg/dL (ref 7.0–26.0)
CALCIUM: 9.2 mg/dL (ref 8.4–10.4)
CO2: 25 meq/L (ref 22–29)
CREATININE: 0.7 mg/dL (ref 0.6–1.1)
Chloride: 107 mEq/L (ref 98–109)
EGFR: >90 mL/min/{1.73_m2} (ref >90)
Glucose: 98 mg/dl (ref 70–140)
Potassium: 3.8 mEq/L (ref 3.5–5.1)
Sodium: 142 mEq/L (ref 136–145)
TOTAL PROTEIN: 6.8 g/dL (ref 6.4–8.3)

## 2015-03-11 LAB — CBC WITH DIFFERENTIAL/PLATELET
BASO%: 0.9 % (ref 0.0–2.0)
BASOS ABS: 0 10*3/uL (ref 0.0–0.1)
EOS ABS: 0.1 10*3/uL (ref 0.0–0.5)
EOS%: 3.6 % (ref 0.0–7.0)
HCT: 37.8 % (ref 34.8–46.6)
HGB: 13.1 g/dL (ref 11.6–15.9)
LYMPH%: 50 % — AB (ref 14.0–49.7)
MCH: 32 pg (ref 25.1–34.0)
MCHC: 34.7 g/dL (ref 31.5–36.0)
MCV: 92.2 fL (ref 79.5–101.0)
MONO#: 0.5 10*3/uL (ref 0.1–0.9)
MONO%: 15.7 % — AB (ref 0.0–14.0)
NEUT%: 29.8 % — AB (ref 38.4–76.8)
NEUTROS ABS: 1 10*3/uL — AB (ref 1.5–6.5)
PLATELETS: 346 10*3/uL (ref 145–400)
RBC: 4.1 10*6/uL (ref 3.70–5.45)
RDW: 14.8 % — ABNORMAL HIGH (ref 11.2–14.5)
WBC: 3.3 10*3/uL — AB (ref 3.9–10.3)
lymph#: 1.7 10*3/uL (ref 0.9–3.3)

## 2015-03-11 LAB — PREGNANCY, URINE: Preg Test, Ur: NEGATIVE

## 2015-03-11 MED ORDER — VINBLASTINE SULFATE CHEMO INJECTION 1 MG/ML
4.5000 mg/m2 | Freq: Once | INTRAVENOUS | Status: AC
  Administered 2015-03-11: 7 mg via INTRAVENOUS
  Filled 2015-03-11: qty 7

## 2015-03-11 MED ORDER — SODIUM CHLORIDE 0.9 % IV SOLN
Freq: Once | INTRAVENOUS | Status: AC
  Administered 2015-03-11: 11:00:00 via INTRAVENOUS

## 2015-03-11 MED ORDER — SODIUM CHLORIDE 0.9 % IJ SOLN
10.0000 mL | INTRAMUSCULAR | Status: DC | PRN
  Administered 2015-03-11: 10 mL via INTRAVENOUS
  Filled 2015-03-11: qty 10

## 2015-03-11 MED ORDER — HEPARIN SOD (PORK) LOCK FLUSH 100 UNIT/ML IV SOLN
500.0000 [IU] | Freq: Once | INTRAVENOUS | Status: AC | PRN
  Administered 2015-03-11: 500 [IU]
  Filled 2015-03-11: qty 5

## 2015-03-11 MED ORDER — SODIUM CHLORIDE 0.9 % IV SOLN
300.0000 mg/m2 | Freq: Once | INTRAVENOUS | Status: AC
  Administered 2015-03-11: 460 mg via INTRAVENOUS
  Filled 2015-03-11: qty 23

## 2015-03-11 MED ORDER — SODIUM CHLORIDE 0.9 % IV SOLN
Freq: Once | INTRAVENOUS | Status: AC
  Administered 2015-03-11: 11:00:00 via INTRAVENOUS
  Filled 2015-03-11: qty 8

## 2015-03-11 MED ORDER — DOXORUBICIN HCL CHEMO IV INJECTION 2 MG/ML
20.0000 mg/m2 | Freq: Once | INTRAVENOUS | Status: AC
  Administered 2015-03-11: 32 mg via INTRAVENOUS
  Filled 2015-03-11: qty 16

## 2015-03-11 MED ORDER — SODIUM CHLORIDE 0.9 % IV SOLN
8.0000 [IU]/m2 | Freq: Once | INTRAVENOUS | Status: AC
  Administered 2015-03-11: 12 [IU] via INTRAVENOUS
  Filled 2015-03-11: qty 4

## 2015-03-11 MED ORDER — SODIUM CHLORIDE 0.9 % IJ SOLN
10.0000 mL | INTRAMUSCULAR | Status: DC | PRN
  Administered 2015-03-11: 10 mL
  Filled 2015-03-11: qty 10

## 2015-03-11 NOTE — Progress Notes (Signed)
OK to treat with ANC-1.0 per Dr. Alvy Bimler

## 2015-03-11 NOTE — Patient Instructions (Signed)
[facility] Discharge Instructions for Patients Receiving Chemotherapy  Today you received the following chemotherapy agents Adriamycin/Vinblastine/Bleomycin/Decarbazine.  To help prevent nausea and vomiting after your treatment, we encourage you to take your nausea medication as directed.   If you develop nausea and vomiting that is not controlled by your nausea medication, call the clinic.   BELOW ARE SYMPTOMS THAT SHOULD BE REPORTED IMMEDIATELY:  *FEVER GREATER THAN 100.5 F  *CHILLS WITH OR WITHOUT FEVER  NAUSEA AND VOMITING THAT IS NOT CONTROLLED WITH YOUR NAUSEA MEDICATION  *UNUSUAL SHORTNESS OF BREATH  *UNUSUAL BRUISING OR BLEEDING  TENDERNESS IN MOUTH AND THROAT WITH OR WITHOUT PRESENCE OF ULCERS  *URINARY PROBLEMS  *BOWEL PROBLEMS  UNUSUAL RASH Items with * indicate a potential emergency and should be followed up as soon as possible.  Feel free to call the clinic you have any questions or concerns. The clinic phone number is [phone].  Please show the CHEMO ALERT CARD at check-in to the Emergency Department and triage nurse.    

## 2015-03-25 ENCOUNTER — Other Ambulatory Visit (HOSPITAL_COMMUNITY)
Admission: RE | Admit: 2015-03-25 | Discharge: 2015-03-25 | Disposition: A | Discharge status: Home / Self Care | Payer: Medicaid Other | Source: Ambulatory Visit | Attending: Hematology and Oncology | Admitting: Hematology and Oncology

## 2015-03-25 ENCOUNTER — Other Ambulatory Visit: Payer: Self-pay | Admitting: Hematology and Oncology

## 2015-03-25 ENCOUNTER — Telehealth: Payer: Self-pay | Admitting: Hematology and Oncology

## 2015-03-25 ENCOUNTER — Ambulatory Visit (HOSPITAL_BASED_OUTPATIENT_CLINIC_OR_DEPARTMENT_OTHER): Payer: Medicaid Other

## 2015-03-25 ENCOUNTER — Telehealth: Payer: Self-pay | Admitting: *Deleted

## 2015-03-25 ENCOUNTER — Other Ambulatory Visit (HOSPITAL_BASED_OUTPATIENT_CLINIC_OR_DEPARTMENT_OTHER): Payer: Medicaid Other

## 2015-03-25 ENCOUNTER — Ambulatory Visit (HOSPITAL_BASED_OUTPATIENT_CLINIC_OR_DEPARTMENT_OTHER): Payer: Medicaid Other | Admitting: Hematology and Oncology

## 2015-03-25 ENCOUNTER — Ambulatory Visit: Payer: Medicaid Other

## 2015-03-25 VITALS — BP 109/72 | HR 71 | Temp 98.6°F | Resp 20 | Ht 64.0 in | Wt 103.4 lb

## 2015-03-25 DIAGNOSIS — Z5111 Encounter for antineoplastic chemotherapy: Secondary | ICD-10-CM

## 2015-03-25 DIAGNOSIS — C8174 Other classical Hodgkin lymphoma, lymph nodes of axilla and upper limb: Secondary | ICD-10-CM

## 2015-03-25 DIAGNOSIS — C8190 Hodgkin lymphoma, unspecified, unspecified site: Secondary | ICD-10-CM | POA: Diagnosis not present

## 2015-03-25 DIAGNOSIS — M79604 Pain in right leg: Secondary | ICD-10-CM | POA: Diagnosis not present

## 2015-03-25 DIAGNOSIS — R17 Unspecified jaundice: Secondary | ICD-10-CM

## 2015-03-25 DIAGNOSIS — R945 Abnormal results of liver function studies: Secondary | ICD-10-CM

## 2015-03-25 DIAGNOSIS — Z9119 Patient's noncompliance with other medical treatment and regimen: Secondary | ICD-10-CM

## 2015-03-25 DIAGNOSIS — R7989 Other specified abnormal findings of blood chemistry: Secondary | ICD-10-CM

## 2015-03-25 DIAGNOSIS — B20 Human immunodeficiency virus [HIV] disease: Secondary | ICD-10-CM | POA: Diagnosis not present

## 2015-03-25 DIAGNOSIS — Z95828 Presence of other vascular implants and grafts: Secondary | ICD-10-CM

## 2015-03-25 DIAGNOSIS — R197 Diarrhea, unspecified: Secondary | ICD-10-CM | POA: Diagnosis not present

## 2015-03-25 DIAGNOSIS — Z91199 Patient's noncompliance with other medical treatment and regimen due to unspecified reason: Secondary | ICD-10-CM

## 2015-03-25 DIAGNOSIS — M79605 Pain in left leg: Secondary | ICD-10-CM

## 2015-03-25 DIAGNOSIS — D701 Agranulocytosis secondary to cancer chemotherapy: Secondary | ICD-10-CM

## 2015-03-25 DIAGNOSIS — R600 Localized edema: Secondary | ICD-10-CM

## 2015-03-25 DIAGNOSIS — C8170 Other classical Hodgkin lymphoma, unspecified site: Secondary | ICD-10-CM

## 2015-03-25 DIAGNOSIS — D702 Other drug-induced agranulocytosis: Secondary | ICD-10-CM

## 2015-03-25 LAB — COMPREHENSIVE METABOLIC PANEL (CC13)
ALBUMIN: 3.9 g/dL (ref 3.5–5.0)
ALK PHOS: 72 U/L (ref 40–150)
ALT: 18 U/L (ref 0–55)
AST: 17 U/L (ref 5–34)
Anion Gap: 10 mEq/L (ref 3–11)
BUN: 7.2 mg/dL (ref 7.0–26.0)
CO2: 25 mEq/L (ref 22–29)
Calcium: 9.7 mg/dL (ref 8.4–10.4)
Chloride: 105 mEq/L (ref 98–109)
Creatinine: 0.8 mg/dL (ref 0.6–1.1)
GLUCOSE: 79 mg/dL (ref 70–140)
POTASSIUM: 4 meq/L (ref 3.5–5.1)
SODIUM: 140 meq/L (ref 136–145)
Total Bilirubin: 1.73 mg/dL — ABNORMAL HIGH (ref 0.20–1.20)
Total Protein: 7.6 g/dL (ref 6.4–8.3)

## 2015-03-25 LAB — CBC WITH DIFFERENTIAL/PLATELET
BASO%: 1.1 % (ref 0.0–2.0)
Basophils Absolute: 0 10*3/uL (ref 0.0–0.1)
EOS ABS: 0.1 10*3/uL (ref 0.0–0.5)
EOS%: 3.8 % (ref 0.0–7.0)
HCT: 41 % (ref 34.8–46.6)
HGB: 13.9 g/dL (ref 11.6–15.9)
LYMPH#: 1.5 10*3/uL (ref 0.9–3.3)
LYMPH%: 59.6 % — ABNORMAL HIGH (ref 14.0–49.7)
MCH: 31.9 pg (ref 25.1–34.0)
MCHC: 33.9 g/dL (ref 31.5–36.0)
MCV: 94 fL (ref 79.5–101.0)
MONO#: 0.7 10*3/uL (ref 0.1–0.9)
MONO%: 28.8 % — ABNORMAL HIGH (ref 0.0–14.0)
NEUT#: 0.2 10*3/uL — CL (ref 1.5–6.5)
NEUT%: 6.7 % — AB (ref 38.4–76.8)
Platelets: 323 10*3/uL (ref 145–400)
RBC: 4.36 10*6/uL (ref 3.70–5.45)
RDW: 15.7 % — ABNORMAL HIGH (ref 11.2–14.5)
WBC: 2.5 10*3/uL — ABNORMAL LOW (ref 3.9–10.3)

## 2015-03-25 LAB — PREGNANCY, URINE: Preg Test, Ur: NEGATIVE

## 2015-03-25 LAB — LACTATE DEHYDROGENASE (CC13): LDH: 131 U/L (ref 125–245)

## 2015-03-25 MED ORDER — LEUPROLIDE ACETATE 7.5 MG IM KIT
7.5000 mg | PACK | INTRAMUSCULAR | Status: DC
  Administered 2015-03-25: 7.5 mg via INTRAMUSCULAR
  Filled 2015-03-25: qty 7.5

## 2015-03-25 MED ORDER — SODIUM CHLORIDE 0.9 % IV SOLN
8.0000 [IU]/m2 | Freq: Once | INTRAVENOUS | Status: AC
  Administered 2015-03-25: 12 [IU] via INTRAVENOUS
  Filled 2015-03-25: qty 4

## 2015-03-25 MED ORDER — SODIUM CHLORIDE 0.9 % IJ SOLN
10.0000 mL | INTRAMUSCULAR | Status: DC | PRN
  Administered 2015-03-25: 10 mL via INTRAVENOUS
  Filled 2015-03-25: qty 10

## 2015-03-25 MED ORDER — HEPARIN SOD (PORK) LOCK FLUSH 100 UNIT/ML IV SOLN
500.0000 [IU] | Freq: Once | INTRAVENOUS | Status: AC | PRN
  Administered 2015-03-25: 500 [IU]
  Filled 2015-03-25: qty 5

## 2015-03-25 MED ORDER — VINBLASTINE SULFATE CHEMO INJECTION 1 MG/ML
4.5000 mg/m2 | Freq: Once | INTRAVENOUS | Status: AC
  Administered 2015-03-25: 7 mg via INTRAVENOUS
  Filled 2015-03-25: qty 7

## 2015-03-25 MED ORDER — DOXORUBICIN HCL CHEMO IV INJECTION 2 MG/ML
20.0000 mg/m2 | Freq: Once | INTRAVENOUS | Status: AC
  Administered 2015-03-25: 32 mg via INTRAVENOUS
  Filled 2015-03-25: qty 16

## 2015-03-25 MED ORDER — SODIUM CHLORIDE 0.9 % IV SOLN
Freq: Once | INTRAVENOUS | Status: AC
  Administered 2015-03-25: 13:00:00 via INTRAVENOUS

## 2015-03-25 MED ORDER — DEXAMETHASONE SODIUM PHOSPHATE 100 MG/10ML IJ SOLN
Freq: Once | INTRAMUSCULAR | Status: AC
  Administered 2015-03-25: 13:00:00 via INTRAVENOUS
  Filled 2015-03-25: qty 8

## 2015-03-25 MED ORDER — SODIUM CHLORIDE 0.9 % IJ SOLN
10.0000 mL | INTRAMUSCULAR | Status: DC | PRN
  Administered 2015-03-25: 10 mL
  Filled 2015-03-25: qty 10

## 2015-03-25 MED ORDER — SODIUM CHLORIDE 0.9 % IV SOLN
300.0000 mg/m2 | Freq: Once | INTRAVENOUS | Status: AC
  Administered 2015-03-25: 460 mg via INTRAVENOUS
  Filled 2015-03-25: qty 23

## 2015-03-25 NOTE — Telephone Encounter (Signed)
Per staff message and POF I have scheduled appts. Advised scheduler of appts and to move labs. JMW  

## 2015-03-25 NOTE — Assessment & Plan Note (Signed)
This is likely due to recent treatment. The patient denies recent history of fevers, cough, chills, diarrhea or dysuria. She is asymptomatic from the leukopenia. I will observe for now.  I will continue the chemotherapy at current dose without dosage adjustment.  If the leukopenia gets progressive worse in the future, I might have to delay her treatment or adjust the chemotherapy dose.   

## 2015-03-25 NOTE — Assessment & Plan Note (Signed)
She continues taking anti-retroviral treatment. Again, I reinforced the importance of her keeping her appointments with infectious disease. She stated that she will be compliant taking her anti-retroviral treatment in the future 

## 2015-03-25 NOTE — Assessment & Plan Note (Signed)
She has history of chronic pain and substance abuse in the past. I felt that some of the pain could be related to treatment. I recommend ibuprofen as needed and vitamin D supplement.

## 2015-03-25 NOTE — Assessment & Plan Note (Signed)
Her function tests has thankfully recovered fully. She has intermittent elevated bilirubin I would continue same Rx dose as before

## 2015-03-25 NOTE — Assessment & Plan Note (Signed)
The patient has significant medical noncompliance and had missed many appointments and rescheduled many appointments in the past. She was discharged from my service but recently she repented and promised that she will be compliant in the future.  I also got verbal confirmation from the patient's mother that she will also make sure that the patient keeps all her appointment in the future 

## 2015-03-25 NOTE — Assessment & Plan Note (Addendum)
The diarrhea is likely infectious in origin, resolving This was thought to be related to recent treatment as well Continue same dose of Rx without further dose adjustment

## 2015-03-25 NOTE — Assessment & Plan Note (Signed)
With her last dose of treatment, I increase her back to full doses due to improve mild liver function tests. Unfortunately, she developed infectious complication and diarrhea. Her symptoms are almost back to baseline but she still had persistent watery diarrhea twice a day. I prefer to reduce all her chemotherapy by 20% due to recent side effects. Clinically, she appears to be responding to treatment with resolution of some of the palpable lymphadenopathy on her left neck. Plan to complete this cycle of treatment and an order a PET CT scan next month to assess response to treatment.

## 2015-03-25 NOTE — Patient Instructions (Signed)
[facility]cer Center Discharge Instructions for Patients Receiving Chemotherapy  Today you received the following chemotherapy agents: Adriamycin, Vinblastine, Bleomycin, and Dacarbazine.  To help prevent nausea and vomiting after your treatment, we encourage you to take your nausea medication as directed.    If you develop nausea and vomiting that is not controlled by your nausea medication, call the clinic.   BELOW ARE SYMPTOMS THAT SHOULD BE REPORTED IMMEDIATELY:  *FEVER GREATER THAN 100.5 F  *CHILLS WITH OR WITHOUT FEVER  NAUSEA AND VOMITING THAT IS NOT CONTROLLED WITH YOUR NAUSEA MEDICATION  *UNUSUAL SHORTNESS OF BREATH  *UNUSUAL BRUISING OR BLEEDING  TENDERNESS IN MOUTH AND THROAT WITH OR WITHOUT PRESENCE OF ULCERS  *URINARY PROBLEMS  *BOWEL PROBLEMS  UNUSUAL RASH Items with * indicate a potential emergency and should be followed up as soon as possible.  Feel free to call the clinic you have any questions or concerns. The clinic phone number is [phone].  Please show the CHEMO ALERT CARD at check-in to the Emergency Department and triage nurse.   

## 2015-03-25 NOTE — Telephone Encounter (Signed)
per pof to sch pt appt-sent MW email to sch trmt-pt aware °

## 2015-03-25 NOTE — Patient Instructions (Signed)

## 2015-03-25 NOTE — Progress Notes (Signed)
Per Tammi RN ( Dr. Calton Dach nurse) okay to proceed with treatment despite the labs and pt to receive Lupron during today's visit.  Blood return noted before, Every 3cc and after Adrimycin and before and after Vinblastine.

## 2015-03-25 NOTE — Telephone Encounter (Signed)
Per staff message and POF I have scheduled appts. Advised scheduler of appts. JMW  

## 2015-03-25 NOTE — Progress Notes (Signed)
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   05/07/2014 Imaging CT scan of the chest show right thoracic inlet lymphadenopathy   06/07/2014 Procedure She underwent ultrasound-guided core biopsy of the neck lymph node   06/07/2014 Pathology Results Accession: [id number] biopsy confirmed diagnosis of Hodgkin lymphoma.   06/15/2014 Imaging Echocardiogram showed preserved ejection fraction   07/09/2014 - 07/12/2014 Hospital Admission She was admitted to the hospital for severe anemia.   07/27/2014 Procedure She had placement of port   07/31/2014 - 09/11/2014 Chemotherapy She received dose adjusted chemotherapy due to abnormal liver function tests and severe anemia. Treatment was delayed due to noncompliance  and subsequently stopped because the patient failed to keep appointments   01/11/2015 Imaging Repeat PET CT scan showed response to treatment   01/28/2015 -  Chemotherapy ABVD was restarted with full dose.   02/08/2015 - 02/10/2015 Hospital Admission The patient was admitted to the hospital due to pancytopenia and profuse diarrhea. Cultures were negative. She was placed on ciprofloxacin.   02/11/2015 Adverse Reaction Treatment was placed on hold due to recent infection.    INTERVAL HISTORY: Please see below for problem oriented charting. She returns prior to cycle 4 of treatment. She tolerated last cycle treatment well. She continues to loose bowel movement per day and intermittent joint pain. Denies mucositis, nausea or vomiting. No recent infection.  REVIEW OF SYSTEMS:   Constitutional: Denies  fevers, chills or abnormal weight loss Eyes: Denies blurriness of vision Ears, nose, mouth, throat, and face: Denies mucositis or sore throat Respiratory: Denies cough, dyspnea or wheezes Cardiovascular: Denies palpitation, chest discomfort or lower extremity swelling Skin: Denies abnormal skin rashes Lymphatics: Denies new lymphadenopathy or easy bruising Neurological:Denies numbness, tingling or new weaknesses Behavioral/Psych: Mood is stable, no new changes  All other systems were reviewed with the patient and are negative.  I have reviewed the past medical history, past surgical history, social history and family history with the patient and they are unchanged from previous note.  ALLERGIES:  has No Known Allergies.  MEDICATIONS:  Current Outpatient Prescriptions  Medication Sig Dispense Refill  . atazanavir (REYATAZ) 300 MG capsule Take 1 capsule (300 mg total) by mouth daily with breakfast. TAKE WITH NORVIR 30 capsule 5  . buPROPion (WELLBUTRIN XL) 150 MG 24 hr tablet Take 150 mg by mouth at bedtime.     Marland Kitchen emtricitabine-tenofovir (TRUVADA) 200-300 MG per tablet Take 1 tablet by mouth daily. 30 tablet 5  . HYDROcodone-acetaminophen (NORCO) 5-325 MG tablet Take 1 tablet by mouth every 6 (six) hours as needed. 20 tablet 0  . hydrocortisone cream 0.5 % Apply 1 application topically 2 (two) times daily. 30 g 1  . ibuprofen (ADVIL,MOTRIN) 200 MG tablet Take 400 mg by mouth every 6 (six) hours as needed for moderate pain.    Marland Kitchen ketoconazole (NIZORAL) 2 % cream Apply 1 application topically 2 (two) times daily as needed for irritation. 60 g 3  . lidocaine-prilocaine (EMLA) cream Apply to affected area once 30 g 3  . Naphazoline HCl (CLEAR EYES OP) Apply 2 drops to eye 3 (three) times daily as needed (redness).     Marland Kitchen  ondansetron (ZOFRAN ODT) 8 MG disintegrating tablet Take 1 tablet (8 mg total) by mouth every 8 (eight) hours as needed for nausea or vomiting. 15 tablet 0  . ondansetron  (ZOFRAN) 4 MG tablet Take 1 tablet (4 mg total) by mouth every 6 (six) hours. 15 tablet 0  . potassium chloride SA (K-DUR,KLOR-CON) 20 MEQ tablet Take 1 tablet (20 mEq total) by mouth 2 (two) times daily. 60 tablet 0  . ritonavir (NORVIR) 100 MG TABS tablet Take 1 tablet (100 mg total) by mouth daily with breakfast. TAKE WITH REYATAZ 30 tablet 5  . traZODone (DESYREL) 50 MG tablet Take 50 mg by mouth at bedtime.     No current facility-administered medications for this visit.   Facility-Administered Medications Ordered in Other Visits  Medication Dose Route Frequency Provider Last Rate Last Dose  . sodium chloride 0.9 % injection 10 mL  10 mL Intravenous PRN Heath Lark, MD   10 mL at 03/25/15 1132    PHYSICAL EXAMINATION: ECOG PERFORMANCE STATUS: 1 - Symptomatic but completely ambulatory  Filed Vitals:   03/25/15 1144  BP: 109/72  Pulse: 71  Temp: 98.6 F (37 C)  Resp: 20   Filed Weights   03/25/15 1144  Weight: 103 lb 6.4 oz (46.902 kg)    GENERAL:alert, no distress and comfortable. She looks thin and cachectic  SKIN: skin color, texture, turgor are normal, no rashes or significant lesions EYES: normal, Conjunctiva are pink and non-injected, sclera clear OROPHARYNX:no exudate, no erythema and lips, buccal mucosa, and tongue normal  NECK: supple, thyroid normal size, non-tender, without nodularity LYMPHpersistent palpable lymphadenopathy in the neck region only. All other lymph nodes have regressed. NGS: clear to auscultation and percussion with normal breathing effort HEART: regular rate & rhythm and no murmurs and no lower extremity edema ABDOMEN:abdomen soft, non-tender and normal bowel sounds Musculoskeletal:no cyanosis of digits and no clubbing  NEURO: alert & oriented x 3 with fluent speech, no focal motor/sensory deficits  LABORATORY DATA:  I have reviewed the data as listed    Component Value Date/Time   NA 140 03/25/2015 1039   NA 139 02/10/2015 1202   K 4.0  03/25/2015 1039   K 3.4* 02/10/2015 1202   CL 108 02/10/2015 1202   CO2 25 03/25/2015 1039   CO2 26 02/10/2015 1202   GLUCOSE 79 03/25/2015 1039   GLUCOSE 81 02/10/2015 1202   BUN 7.2 03/25/2015 1039   BUN <5* 02/10/2015 1202   CREATININE 0.8 03/25/2015 1039   CREATININE 0.77 02/10/2015 1202   CREATININE 0.66 12/04/2014 1014   CALCIUM 9.7 03/25/2015 1039   CALCIUM 8.4* 02/10/2015 1202   PROT 7.6 03/25/2015 1039   PROT 7.1 02/08/2015 1158   ALBUMIN 3.9 03/25/2015 1039   ALBUMIN 3.9 02/08/2015 1158   AST 17 03/25/2015 1039   AST 25 02/08/2015 1158   ALT 18 03/25/2015 1039   ALT 22 02/08/2015 1158   ALKPHOS 72 03/25/2015 1039   ALKPHOS 66 02/08/2015 1158   BILITOT 1.73* 03/25/2015 1039   BILITOT 0.5 02/08/2015 1158   GFRNONAA >60 02/10/2015 1202   GFRNONAA >89 12/04/2014 1014   GFRAA >60 02/10/2015 1202   GFRAA >89 12/04/2014 1014    No results found for: SPEP, UPEP  Lab Results  Component Value Date   WBC 2.5* 03/25/2015   NEUTROABS 0.2* 03/25/2015   HGB 13.9 03/25/2015   HCT 41.0 03/25/2015   MCV 94.0 03/25/2015   PLT 323 03/25/2015  Chemistry      Component Value Date/Time   NA 140 03/25/2015 1039   NA 139 02/10/2015 1202   K 4.0 03/25/2015 1039   K 3.4* 02/10/2015 1202   CL 108 02/10/2015 1202   CO2 25 03/25/2015 1039   CO2 26 02/10/2015 1202   BUN 7.2 03/25/2015 1039   BUN <5* 02/10/2015 1202   CREATININE 0.8 03/25/2015 1039   CREATININE 0.77 02/10/2015 1202   CREATININE 0.66 12/04/2014 1014      Component Value Date/Time   CALCIUM 9.7 03/25/2015 1039   CALCIUM 8.4* 02/10/2015 1202   ALKPHOS 72 03/25/2015 1039   ALKPHOS 66 02/08/2015 1158   AST 17 03/25/2015 1039   AST 25 02/08/2015 1158   ALT 18 03/25/2015 1039   ALT 22 02/08/2015 1158   BILITOT 1.73* 03/25/2015 1039   BILITOT 0.5 02/08/2015 1158     ASSESSMENT & PLAN:  Other classical hodgkin lymphoma, 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 Unfortunately, she developed infectious complication and diarrhea. Her symptoms are almost back to baseline but she still had persistent watery diarrhea twice a day. I prefer to reduce all her chemotherapy by 20% due to recent side effects. Clinically, she appears to be responding to treatment with resolution of some of the palpable lymphadenopathy on her left neck. Plan to complete this cycle of treatment and an order a PET CT scan next month to assess response to treatment.  Human immunodeficiency virus (HIV) disease ([place]) She continues taking anti-retroviral treatment. Again, I reinforced the importance of her keeping her appointments with infectious disease. She stated that she will be compliant taking her anti-retroviral treatment in the future   Drug-induced neutropenia ([place]) This is likely due to recent treatment. The patient denies recent history of fevers, cough, chills, diarrhea or dysuria. She is asymptomatic from the leukopenia. I will observe for now.  I will continue the chemotherapy at current dose without dosage adjustment.  If the leukopenia gets progressive worse in the future, I might have to delay her treatment or adjust the chemotherapy dose.    H/O noncompliance with medical treatment, presenting hazards to health The patient has significant medical noncompliance and had missed many appointments and rescheduled many appointments in the past. She was discharged from my service but recently she repented and promised that she will be compliant in the future.  I also got verbal confirmation from the patient's mother that she will also make sure that the patient keeps all her appointment in the future    Diarrhea The diarrhea is likely infectious in origin, resolving This was thought to be related to recent treatment as well Continue same dose of Rx without further dose adjustment  Bilateral leg pain She  has history of chronic pain and substance abuse in the past. I felt that some of the pain could be related to treatment. I recommend ibuprofen as needed and vitamin D supplement.  Abnormal liver function tests Her function tests has thankfully recovered fully. She has intermittent elevated bilirubin I would continue same Rx dose as before     Orders Placed This Encounter  Procedures  . NM PET Image Restag (PS) Skull Base To Thigh    Standing Status: Future     Number of Occurrences:      Standing Expiration Date: 05/24/2016    Order Specific Question:  Reason for Exam (  SYMPTOM  OR DIAGNOSIS REQUIRED)    Answer:  hodgkin's lymphoma assess response to Rx    Order Specific Question:  Is the patient pregnant?    Answer:  No    Order Specific Question:  Preferred imaging location?    Answer:  [facility]   All questions were answered. The patient knows to call the clinic with any problems, questions or concerns. No barriers to learning was detected. I spent 30 minutes counseling the patient face to face. The total time spent in the appointment was 40 minutes and more than 50% was on counseling and review of test results     [facility], [place], MD 03/25/2015 12:33 PM

## 2015-04-04 ENCOUNTER — Encounter (HOSPITAL_COMMUNITY): Payer: Medicaid Other

## 2015-04-08 ENCOUNTER — Ambulatory Visit: Payer: Medicaid Other

## 2015-04-08 ENCOUNTER — Other Ambulatory Visit (HOSPITAL_COMMUNITY)
Admission: RE | Admit: 2015-04-08 | Discharge: 2015-04-08 | Disposition: A | Discharge status: Home / Self Care | Payer: Medicaid Other | Source: Ambulatory Visit | Attending: Hematology and Oncology | Admitting: Hematology and Oncology

## 2015-04-08 ENCOUNTER — Other Ambulatory Visit (HOSPITAL_BASED_OUTPATIENT_CLINIC_OR_DEPARTMENT_OTHER): Payer: Medicaid Other

## 2015-04-08 DIAGNOSIS — Z95828 Presence of other vascular implants and grafts: Secondary | ICD-10-CM

## 2015-04-08 DIAGNOSIS — C8190 Hodgkin lymphoma, unspecified, unspecified site: Secondary | ICD-10-CM

## 2015-04-08 LAB — CBC WITH DIFFERENTIAL/PLATELET
BASO%: 1.6 % (ref 0.0–2.0)
Basophils Absolute: 0 10*3/uL (ref 0.0–0.1)
EOS ABS: 0.1 10*3/uL (ref 0.0–0.5)
EOS%: 3.2 % (ref 0.0–7.0)
HCT: 36.3 % (ref 34.8–46.6)
HEMOGLOBIN: 12.6 g/dL (ref 11.6–15.9)
LYMPH#: 1.2 10*3/uL (ref 0.9–3.3)
LYMPH%: 62.6 % — AB (ref 14.0–49.7)
MCH: 32.3 pg (ref 25.1–34.0)
MCHC: 34.7 g/dL (ref 31.5–36.0)
MCV: 93.1 fL (ref 79.5–101.0)
MONO#: 0.6 10*3/uL (ref 0.1–0.9)
MONO%: 30 % — ABNORMAL HIGH (ref 0.0–14.0)
NEUT%: 2.6 % — ABNORMAL LOW (ref 38.4–76.8)
NEUTROS ABS: 0.1 10*3/uL — AB (ref 1.5–6.5)
PLATELETS: 319 10*3/uL (ref 145–400)
RBC: 3.9 10*6/uL (ref 3.70–5.45)
RDW: 15 % — AB (ref 11.2–14.5)
WBC: 1.9 10*3/uL — AB (ref 3.9–10.3)

## 2015-04-08 LAB — COMPREHENSIVE METABOLIC PANEL
ALBUMIN: 3.7 g/dL (ref 3.5–5.0)
ALK PHOS: 65 U/L (ref 40–150)
ALT: 22 U/L (ref 0–55)
ANION GAP: 7 meq/L (ref 3–11)
AST: 20 U/L (ref 5–34)
BUN: 5.4 mg/dL — ABNORMAL LOW (ref 7.0–26.0)
CO2: 26 meq/L (ref 22–29)
CREATININE: 0.7 mg/dL (ref 0.6–1.1)
Calcium: 9.4 mg/dL (ref 8.4–10.4)
Chloride: 106 mEq/L (ref 98–109)
GLUCOSE: 80 mg/dL (ref 70–140)
Potassium: 4.3 mEq/L (ref 3.5–5.1)
Sodium: 139 mEq/L (ref 136–145)
TOTAL PROTEIN: 6.9 g/dL (ref 6.4–8.3)

## 2015-04-08 LAB — PREGNANCY, URINE: Preg Test, Ur: NEGATIVE

## 2015-04-08 MED ORDER — SODIUM CHLORIDE 0.9 % IJ SOLN
10.0000 mL | INTRAMUSCULAR | Status: DC | PRN
  Administered 2015-04-08: 10 mL via INTRAVENOUS
  Filled 2015-04-08: qty 10

## 2015-04-08 NOTE — Progress Notes (Signed)
Due to 3 hour wait time until infusion appointment, patient wanted to reschedule infusion appointment. Charge Nurse notified and verbalized understanding. Labs drawn except for LDH at fault of RN. LDH to be drawn with upcoming infusion appointment.

## 2015-04-11 ENCOUNTER — Ambulatory Visit: Payer: Medicaid Other | Admitting: Internal Medicine

## 2015-04-11 ENCOUNTER — Encounter (HOSPITAL_COMMUNITY)
Admission: RE | Admit: 2015-04-11 | Discharge: 2015-04-11 | Disposition: A | Discharge status: Home / Self Care | Payer: Medicaid Other | Source: Ambulatory Visit | Attending: Hematology and Oncology | Admitting: Hematology and Oncology

## 2015-04-11 DIAGNOSIS — C8174 Other classical Hodgkin lymphoma, lymph nodes of axilla and upper limb: Secondary | ICD-10-CM | POA: Diagnosis not present

## 2015-04-11 LAB — GLUCOSE, CAPILLARY: Glucose-Capillary: 93 mg/dL (ref 65–99)

## 2015-04-11 MED ORDER — FLUDEOXYGLUCOSE F - 18 (FDG) INJECTION
6.0100 | Freq: Once | INTRAVENOUS | Status: AC | PRN
  Administered 2015-04-11: 6.01 via INTRAVENOUS

## 2015-04-23 ENCOUNTER — Encounter: Payer: Self-pay | Admitting: Hematology and Oncology

## 2015-04-23 ENCOUNTER — Other Ambulatory Visit: Payer: Self-pay | Admitting: *Deleted

## 2015-04-23 ENCOUNTER — Other Ambulatory Visit (HOSPITAL_COMMUNITY)
Admission: RE | Admit: 2015-04-23 | Discharge: 2015-04-23 | Disposition: A | Discharge status: Home / Self Care | Payer: Medicaid Other | Source: Ambulatory Visit | Attending: Hematology and Oncology | Admitting: Hematology and Oncology

## 2015-04-23 ENCOUNTER — Encounter (HOSPITAL_BASED_OUTPATIENT_CLINIC_OR_DEPARTMENT_OTHER): Payer: Medicaid Other | Admitting: Hematology and Oncology

## 2015-04-23 ENCOUNTER — Other Ambulatory Visit: Payer: Self-pay | Admitting: Hematology and Oncology

## 2015-04-23 ENCOUNTER — Ambulatory Visit (HOSPITAL_BASED_OUTPATIENT_CLINIC_OR_DEPARTMENT_OTHER): Payer: Medicaid Other | Admitting: Hematology and Oncology

## 2015-04-23 ENCOUNTER — Telehealth: Payer: Self-pay | Admitting: Hematology and Oncology

## 2015-04-23 ENCOUNTER — Ambulatory Visit: Payer: Medicaid Other

## 2015-04-23 ENCOUNTER — Ambulatory Visit (HOSPITAL_BASED_OUTPATIENT_CLINIC_OR_DEPARTMENT_OTHER): Payer: Medicaid Other

## 2015-04-23 ENCOUNTER — Other Ambulatory Visit: Payer: Self-pay

## 2015-04-23 VITALS — BP 95/59 | HR 76 | Temp 98.2°F | Resp 18 | Ht 64.0 in | Wt 108.4 lb

## 2015-04-23 DIAGNOSIS — C8174 Other classical Hodgkin lymphoma, lymph nodes of axilla and upper limb: Secondary | ICD-10-CM | POA: Diagnosis not present

## 2015-04-23 DIAGNOSIS — F17210 Nicotine dependence, cigarettes, uncomplicated: Secondary | ICD-10-CM | POA: Insufficient documentation

## 2015-04-23 DIAGNOSIS — Z5111 Encounter for antineoplastic chemotherapy: Secondary | ICD-10-CM

## 2015-04-23 DIAGNOSIS — Z95828 Presence of other vascular implants and grafts: Secondary | ICD-10-CM

## 2015-04-23 DIAGNOSIS — F17290 Nicotine dependence, other tobacco product, uncomplicated: Secondary | ICD-10-CM | POA: Insufficient documentation

## 2015-04-23 DIAGNOSIS — Z91199 Patient's noncompliance with other medical treatment and regimen due to unspecified reason: Secondary | ICD-10-CM

## 2015-04-23 DIAGNOSIS — B20 Human immunodeficiency virus [HIV] disease: Secondary | ICD-10-CM | POA: Diagnosis not present

## 2015-04-23 DIAGNOSIS — Z9119 Patient's noncompliance with other medical treatment and regimen: Secondary | ICD-10-CM | POA: Diagnosis not present

## 2015-04-23 DIAGNOSIS — C8190 Hodgkin lymphoma, unspecified, unspecified site: Secondary | ICD-10-CM

## 2015-04-23 DIAGNOSIS — Z720 Tobacco use: Secondary | ICD-10-CM

## 2015-04-23 DIAGNOSIS — C8170 Other classical Hodgkin lymphoma, unspecified site: Secondary | ICD-10-CM

## 2015-04-23 LAB — CBC WITH DIFFERENTIAL/PLATELET
BASO%: 1.1 % (ref 0.0–2.0)
Basophils Absolute: 0.1 10*3/uL (ref 0.0–0.1)
EOS ABS: 0.2 10*3/uL (ref 0.0–0.5)
EOS%: 1.9 % (ref 0.0–7.0)
HEMATOCRIT: 38.7 % (ref 34.8–46.6)
HEMOGLOBIN: 13.2 g/dL (ref 11.6–15.9)
LYMPH#: 1.7 10*3/uL (ref 0.9–3.3)
LYMPH%: 20.1 % (ref 14.0–49.7)
MCH: 32.5 pg (ref 25.1–34.0)
MCHC: 34.2 g/dL (ref 31.5–36.0)
MCV: 95.2 fL (ref 79.5–101.0)
MONO#: 0.5 10*3/uL (ref 0.1–0.9)
MONO%: 6.5 % (ref 0.0–14.0)
NEUT%: 70.4 % (ref 38.4–76.8)
NEUTROS ABS: 5.8 10*3/uL (ref 1.5–6.5)
Platelets: 223 10*3/uL (ref 145–400)
RBC: 4.07 10*6/uL (ref 3.70–5.45)
RDW: 16.4 % — AB (ref 11.2–14.5)
WBC: 8.2 10*3/uL (ref 3.9–10.3)

## 2015-04-23 LAB — COMPREHENSIVE METABOLIC PANEL
ALBUMIN: 3.8 g/dL (ref 3.5–5.0)
ALK PHOS: 67 U/L (ref 40–150)
ALT: 13 U/L (ref 0–55)
AST: 13 U/L (ref 5–34)
Anion Gap: 9 mEq/L (ref 3–11)
BUN: 9.4 mg/dL (ref 7.0–26.0)
CO2: 25 mEq/L (ref 22–29)
CREATININE: 0.7 mg/dL (ref 0.6–1.1)
Calcium: 9.4 mg/dL (ref 8.4–10.4)
Chloride: 107 mEq/L (ref 98–109)
EGFR: >90 mL/min/{1.73_m2} (ref >90)
GLUCOSE: 91 mg/dL (ref 70–140)
Potassium: 3.8 mEq/L (ref 3.5–5.1)
SODIUM: 141 meq/L (ref 136–145)
TOTAL PROTEIN: 7.5 g/dL (ref 6.4–8.3)

## 2015-04-23 LAB — PREGNANCY, URINE: Preg Test, Ur: NEGATIVE

## 2015-04-23 MED ORDER — SODIUM CHLORIDE 0.9 % IV SOLN
Freq: Once | INTRAVENOUS | Status: AC
  Administered 2015-04-23: 10:00:00 via INTRAVENOUS

## 2015-04-23 MED ORDER — SODIUM CHLORIDE 0.9 % IJ SOLN
10.0000 mL | INTRAMUSCULAR | Status: DC | PRN
  Administered 2015-04-23: 10 mL
  Filled 2015-04-23: qty 10

## 2015-04-23 MED ORDER — SODIUM CHLORIDE 0.9 % IJ SOLN
10.0000 mL | INTRAMUSCULAR | Status: DC | PRN
  Administered 2015-04-23: 10 mL via INTRAVENOUS
  Filled 2015-04-23: qty 10

## 2015-04-23 MED ORDER — VINBLASTINE SULFATE CHEMO INJECTION 1 MG/ML
4.5000 mg/m2 | Freq: Once | INTRAVENOUS | Status: AC
  Administered 2015-04-23: 7 mg via INTRAVENOUS
  Filled 2015-04-23: qty 7

## 2015-04-23 MED ORDER — IBUPROFEN 200 MG PO TABS: 400.0000 mg | ORAL_TABLET | Freq: Four times a day (QID) | ORAL | Status: DC | PRN

## 2015-04-23 MED ORDER — SODIUM CHLORIDE 0.9 % IV SOLN
Freq: Once | INTRAVENOUS | Status: AC
  Administered 2015-04-23: 10:00:00 via INTRAVENOUS
  Filled 2015-04-23: qty 8

## 2015-04-23 MED ORDER — SODIUM CHLORIDE 0.9 % IV SOLN
300.0000 mg/m2 | Freq: Once | INTRAVENOUS | Status: AC
  Administered 2015-04-23: 460 mg via INTRAVENOUS
  Filled 2015-04-23: qty 23

## 2015-04-23 MED ORDER — DOXORUBICIN HCL CHEMO IV INJECTION 2 MG/ML
20.0000 mg/m2 | Freq: Once | INTRAVENOUS | Status: AC
Start: 2015-04-23 — End: 2015-04-23
  Administered 2015-04-23: 32 mg via INTRAVENOUS
  Filled 2015-04-23: qty 16

## 2015-04-23 MED ORDER — LEUPROLIDE ACETATE 7.5 MG IM KIT
7.5000 mg | PACK | INTRAMUSCULAR | Status: DC
  Administered 2015-04-23: 7.5 mg via INTRAMUSCULAR
  Filled 2015-04-23: qty 7.5

## 2015-04-23 MED ORDER — SODIUM CHLORIDE 0.9 % IV SOLN
8.0000 [IU]/m2 | Freq: Once | INTRAVENOUS | Status: AC
  Administered 2015-04-23: 12 [IU] via INTRAVENOUS
  Filled 2015-04-23: qty 4

## 2015-04-23 MED ORDER — HEPARIN SOD (PORK) LOCK FLUSH 100 UNIT/ML IV SOLN
500.0000 [IU] | Freq: Once | INTRAVENOUS | Status: AC | PRN
  Administered 2015-04-23: 500 [IU]
  Filled 2015-04-23: qty 5

## 2015-04-23 NOTE — Telephone Encounter (Signed)
Gave patient avs report and appointments for January  °

## 2015-04-23 NOTE — Progress Notes (Signed)
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   05/07/2014 Imaging CT scan of the chest show right thoracic inlet lymphadenopathy   06/07/2014 Procedure She underwent ultrasound-guided core biopsy of the neck lymph node   06/07/2014 Pathology Results Accession: [id number] biopsy confirmed diagnosis of Hodgkin lymphoma.   06/15/2014 Imaging Echocardiogram showed preserved ejection fraction   07/09/2014 - 07/12/2014 Hospital Admission She was admitted to the hospital for severe anemia.   07/27/2014 Procedure She had placement of port   07/31/2014 - 09/11/2014 Chemotherapy She received dose adjusted chemotherapy due to abnormal liver function tests and severe anemia. Treatment was delayed due to noncompliance  and subsequently stopped because the patient failed to keep appointments   01/11/2015 Imaging Repeat PET CT scan showed response to treatment   01/28/2015 -  Chemotherapy ABVD was restarted with full dose.   02/08/2015 - 02/10/2015 Hospital Admission The patient was admitted to the hospital due to pancytopenia and profuse diarrhea. Cultures were negative. She was placed on ciprofloxacin.   02/11/2015 Adverse Reaction Treatment was placed on hold due to recent infection.   04/11/2015 Imaging PET CT scan showed near complete response. Incidental finding of an abnormal bone lesion, indeterminate. She is not symptomatic. Recommendation from [facility] to observe    INTERVAL HISTORY: Please see below for problem oriented charting. The patient is seen prior to cycle 5 of treatment. She tolerated chemotherapy  well recently. She has been compliant with appointments. She denies side effects from treatment. Denies further diarrhea or recent infection.  REVIEW OF SYSTEMS:   Constitutional: Denies fevers, chills or abnormal weight loss Eyes: Denies blurriness of vision Ears, nose, mouth, throat, and face: Denies mucositis or sore throat Respiratory: Denies cough, dyspnea or wheezes Cardiovascular: Denies palpitation, chest discomfort or lower extremity swelling Gastrointestinal:  Denies nausea, heartburn or change in bowel habits Skin: Denies abnormal skin rashes Lymphatics: Denies new lymphadenopathy or easy bruising Neurological:Denies numbness, tingling or new weaknesses Behavioral/Psych: Mood is stable, no new changes  All other systems were reviewed with the patient and are negative.  I have reviewed the past medical history, past surgical history, social history and family history with the patient and they are unchanged from previous note.  ALLERGIES:  has No Known Allergies.  MEDICATIONS:  Current Outpatient Prescriptions  Medication Sig Dispense Refill  . atazanavir (REYATAZ) 300 MG capsule Take 1 capsule (300 mg total) by mouth daily with breakfast. TAKE WITH NORVIR 30 capsule 5  . buPROPion (WELLBUTRIN XL) 150 MG 24 hr tablet Take 150 mg by mouth at bedtime.     Marland Kitchen emtricitabine-tenofovir (TRUVADA) 200-300 MG per tablet Take 1 tablet by mouth daily. 30 tablet 5  . HYDROcodone-acetaminophen (NORCO) 5-325 MG tablet Take 1 tablet by mouth every 6 (six) hours as needed. 20 tablet 0  . hydrocortisone cream 0.5 % Apply 1 application topically 2 (two) times daily. 30 g 1  . ibuprofen (ADVIL,MOTRIN) 200 MG tablet Take 400 mg by mouth every 6 (six) hours as needed for moderate pain.    Marland Kitchen ketoconazole (NIZORAL) 2 % cream Apply 1 application topically 2 (two) times daily as  needed for irritation. 60 g 3  . lidocaine-prilocaine (EMLA) cream Apply to affected area once 30 g 3  . Naphazoline HCl  (CLEAR EYES OP) Apply 2 drops to eye 3 (three) times daily as needed (redness).     . ondansetron (ZOFRAN ODT) 8 MG disintegrating tablet Take 1 tablet (8 mg total) by mouth every 8 (eight) hours as needed for nausea or vomiting. 15 tablet 0  . ondansetron (ZOFRAN) 4 MG tablet Take 1 tablet (4 mg total) by mouth every 6 (six) hours. 15 tablet 0  . ritonavir (NORVIR) 100 MG TABS tablet Take 1 tablet (100 mg total) by mouth daily with breakfast. TAKE WITH REYATAZ 30 tablet 5  . traZODone (DESYREL) 50 MG tablet Take 50 mg by mouth at bedtime.    . potassium chloride SA (K-DUR,KLOR-CON) 20 MEQ tablet Take 1 tablet (20 mEq total) by mouth 2 (two) times daily. (Patient not taking: Reported on 04/23/2015) 60 tablet 0   No current facility-administered medications for this visit.    PHYSICAL EXAMINATION: ECOG PERFORMANCE STATUS: 0 - Asymptomatic  Filed Vitals:   04/23/15 0848  BP: 95/59  Pulse: 76  Temp: 98.2 F (36.8 C)  Resp: 18   Filed Weights   04/23/15 0848  Weight: 108 lb 6.4 oz (49.17 kg)    GENERAL:alert, no distress and comfortable. She looks thin SKIN: skin color, texture, turgor are normal, no rashes or significant lesions EYES: normal, Conjunctiva are pink and non-injected, sclera clear OROPHARYNX:no exudate, no erythema and lips, buccal mucosa, and tongue normal  NECK: supple, thyroid normal size, non-tender, without nodularity LYMPH:  no palpable lymphadenopathy in the cervical, axillary or inguinal LUNGS: clear to auscultation and percussion with normal breathing effort HEART: regular rate & rhythm and no murmurs and no lower extremity edema ABDOMEN:abdomen soft, non-tender and normal bowel sounds Musculoskeletal:no cyanosis of digits and no clubbing  NEURO: alert & oriented x 3 with fluent speech, no focal motor/sensory deficits  LABORATORY DATA:  I have reviewed the data as listed    Component Value Date/Time   NA 141 04/23/2015 0824   NA 139 02/10/2015 1202   K  3.8 04/23/2015 0824   K 3.4* 02/10/2015 1202   CL 108 02/10/2015 1202   CO2 25 04/23/2015 0824   CO2 26 02/10/2015 1202   GLUCOSE 91 04/23/2015 0824   GLUCOSE 81 02/10/2015 1202   BUN 9.4 04/23/2015 0824   BUN <5* 02/10/2015 1202   CREATININE 0.7 04/23/2015 0824   CREATININE 0.77 02/10/2015 1202   CREATININE 0.66 12/04/2014 1014   CALCIUM 9.4 04/23/2015 0824   CALCIUM 8.4* 02/10/2015 1202   PROT 7.5 04/23/2015 0824   PROT 7.1 02/08/2015 1158   ALBUMIN 3.8 04/23/2015 0824   ALBUMIN 3.9 02/08/2015 1158   AST 13 04/23/2015 0824   AST 25 02/08/2015 1158   ALT 13 04/23/2015 0824   ALT 22 02/08/2015 1158   ALKPHOS 67 04/23/2015 0824   ALKPHOS 66 02/08/2015 1158   BILITOT <0.30 04/23/2015 0824   BILITOT 0.5 02/08/2015 1158   GFRNONAA >60 02/10/2015 1202   GFRNONAA >89 12/04/2014 1014   GFRAA >60 02/10/2015 1202   GFRAA >89 12/04/2014 1014    No results found for: SPEP, UPEP  Lab Results  Component Value Date   WBC 8.2 04/23/2015   NEUTROABS 5.8 04/23/2015   HGB 13.2 04/23/2015   HCT 38.7 04/23/2015   MCV 95.2 04/23/2015   PLT 223 04/23/2015      Chemistry  Component Value Date/Time   NA 141 04/23/2015 0824   NA 139 02/10/2015 1202   K 3.8 04/23/2015 0824   K 3.4* 02/10/2015 1202   CL 108 02/10/2015 1202   CO2 25 04/23/2015 0824   CO2 26 02/10/2015 1202   BUN 9.4 04/23/2015 0824   BUN <5* 02/10/2015 1202   CREATININE 0.7 04/23/2015 0824   CREATININE 0.77 02/10/2015 1202   CREATININE 0.66 12/04/2014 1014      Component Value Date/Time   CALCIUM 9.4 04/23/2015 0824   CALCIUM 8.4* 02/10/2015 1202   ALKPHOS 67 04/23/2015 0824   ALKPHOS 66 02/08/2015 1158   AST 13 04/23/2015 0824   AST 25 02/08/2015 1158   ALT 13 04/23/2015 0824   ALT 22 02/08/2015 1158   BILITOT <0.30 04/23/2015 0824   BILITOT 0.5 02/08/2015 1158       RADIOGRAPHIC STUDIES: I reviewed the PET CT scan with the patient and her mother I have personally reviewed the radiological images  as listed and agreed with the findings in the report.    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 Unfortunately, she developed infectious complication and diarrhea. Subsequently, I reduced all her chemotherapy by 20% due to recent side effects. Clinically, she appears to be responding to treatment with resolution of palpable lymphadenopathy on her left neck & positive response on PET scan I review her PET scan at the most recent hematology tumor board. She have one indeterminate lesion on her back but she is not symptomatic. She has near complete resolution of all disease. I recommend we proceed with 2 more cycles of treatment and then finish off with another imaging study in 3 months.-  Human immunodeficiency virus (HIV) disease ([place]) She continues taking anti-retroviral treatment. Again, I reinforced the importance of her keeping her appointments with infectious disease.    H/O noncompliance with medical treatment, presenting hazards to health The patient has significant medical noncompliance and had missed many appointments and rescheduled many appointments in the past. She was discharged from my service but recently she repented and promised that she will be compliant in the future.  I also got verbal confirmation from the patient's mother that she will also make sure that the patient keeps all her appointment in the future So far, she has been compliant    Tobacco abuse I spent some time counseling the patient the importance of tobacco cessation. she is currently attempting to quit on her own   No orders of the defined types were placed in this encounter.   All questions were answered. The patient knows to call the clinic with any problems, questions or concerns. No barriers to learning was detected. I spent 25 minutes counseling the  patient face to face. The total time spent in the appointment was 30 minutes and more than 50% was on counseling and review of test results     [facility], [place], MD 04/23/2015 9:30 AM

## 2015-04-23 NOTE — Assessment & Plan Note (Signed)
I spent some time counseling the patient the importance of tobacco cessation. she is currently attempting to quit on her own 

## 2015-04-23 NOTE — Patient Instructions (Signed)
[facility] Discharge Instructions for Patients Receiving Chemotherapy  Today you received the following chemotherapy agents: Adriamycin, Vinblastine, Bleomycin, and Dacarbazine.  To help prevent nausea and vomiting after your treatment, we encourage you to take your nausea medication as directed.    If you develop nausea and vomiting that is not controlled by your nausea medication, call the clinic.   BELOW ARE SYMPTOMS THAT SHOULD BE REPORTED IMMEDIATELY:  *FEVER GREATER THAN 100.5 F  *CHILLS WITH OR WITHOUT FEVER  NAUSEA AND VOMITING THAT IS NOT CONTROLLED WITH YOUR NAUSEA MEDICATION  *UNUSUAL SHORTNESS OF BREATH  *UNUSUAL BRUISING OR BLEEDING  TENDERNESS IN MOUTH AND THROAT WITH OR WITHOUT PRESENCE OF ULCERS  *URINARY PROBLEMS  *BOWEL PROBLEMS  UNUSUAL RASH Items with * indicate a potential emergency and should be followed up as soon as possible.  Feel free to call the clinic you have any questions or concerns. The clinic phone number is [phone].  Please show the CHEMO ALERT CARD at check-in to the Emergency Department and triage nurse.   

## 2015-04-23 NOTE — Assessment & Plan Note (Signed)
She continues taking anti-retroviral treatment. Again, I reinforced the importance of her keeping her appointments with infectious disease.

## 2015-04-23 NOTE — Patient Instructions (Signed)

## 2015-04-23 NOTE — Assessment & Plan Note (Signed)
The patient has significant medical noncompliance and had missed many appointments and rescheduled many appointments in the past. She was discharged from my service but recently she repented and promised that she will be compliant in the future.  I also got verbal confirmation from the patient's mother that she will also make sure that the patient keeps all her appointment in the future So far, she has been compliant

## 2015-04-23 NOTE — Assessment & Plan Note (Signed)
Originally, I increased her back to full doses due to improve mild liver function tests. Unfortunately, she developed infectious complication and diarrhea. Subsequently, I reduced all her chemotherapy by 20% due to recent side effects. Clinically, she appears to be responding to treatment with resolution of palpable lymphadenopathy on her left neck & positive response on PET scan I review her PET scan at the most recent hematology tumor board. She have one indeterminate lesion on her back but she is not symptomatic. She has near complete resolution of all disease. I recommend we proceed with 2 more cycles of treatment and then finish off with another imaging study in 3 months.-

## 2015-04-24 ENCOUNTER — Encounter: Payer: Self-pay | Admitting: *Deleted

## 2015-04-24 ENCOUNTER — Other Ambulatory Visit: Payer: Self-pay | Admitting: *Deleted

## 2015-04-24 MED ORDER — IBUPROFEN 200 MG PO TABS: 400.0000 mg | ORAL_TABLET | Freq: Four times a day (QID) | ORAL | Status: DC | PRN

## 2015-04-24 NOTE — Progress Notes (Signed)
  This encounter was created in error - please disregard. Lab opened encounter by mistake

## 2015-04-28 ENCOUNTER — Other Ambulatory Visit: Payer: Self-pay | Admitting: Internal Medicine

## 2015-04-28 DIAGNOSIS — B20 Human immunodeficiency virus [HIV] disease: Secondary | ICD-10-CM

## 2015-05-07 ENCOUNTER — Other Ambulatory Visit (HOSPITAL_COMMUNITY)
Admission: RE | Admit: 2015-05-07 | Discharge: 2015-05-07 | Disposition: A | Discharge status: Home / Self Care | Payer: Medicaid Other | Source: Ambulatory Visit | Attending: Hematology and Oncology | Admitting: Hematology and Oncology

## 2015-05-07 ENCOUNTER — Ambulatory Visit (HOSPITAL_BASED_OUTPATIENT_CLINIC_OR_DEPARTMENT_OTHER): Payer: Medicaid Other

## 2015-05-07 ENCOUNTER — Other Ambulatory Visit (HOSPITAL_BASED_OUTPATIENT_CLINIC_OR_DEPARTMENT_OTHER): Payer: Medicaid Other

## 2015-05-07 ENCOUNTER — Other Ambulatory Visit: Payer: Self-pay | Admitting: Hematology and Oncology

## 2015-05-07 ENCOUNTER — Ambulatory Visit: Payer: Medicaid Other | Admitting: Internal Medicine

## 2015-05-07 ENCOUNTER — Ambulatory Visit: Payer: Medicaid Other

## 2015-05-07 VITALS — BP 107/71 | HR 89 | Temp 98.4°F

## 2015-05-07 DIAGNOSIS — C8190 Hodgkin lymphoma, unspecified, unspecified site: Secondary | ICD-10-CM | POA: Diagnosis present

## 2015-05-07 DIAGNOSIS — Z5111 Encounter for antineoplastic chemotherapy: Secondary | ICD-10-CM | POA: Diagnosis present

## 2015-05-07 DIAGNOSIS — C8174 Other classical Hodgkin lymphoma, lymph nodes of axilla and upper limb: Secondary | ICD-10-CM | POA: Diagnosis not present

## 2015-05-07 DIAGNOSIS — Z95828 Presence of other vascular implants and grafts: Secondary | ICD-10-CM

## 2015-05-07 LAB — COMPREHENSIVE METABOLIC PANEL
ALBUMIN: 4.2 g/dL (ref 3.5–5.0)
ALK PHOS: 81 U/L (ref 40–150)
ALT: 18 U/L (ref 0–55)
ANION GAP: 9 meq/L (ref 3–11)
AST: 18 U/L (ref 5–34)
BUN: 6 mg/dL — ABNORMAL LOW (ref 7.0–26.0)
CO2: 26 mEq/L (ref 22–29)
Calcium: 9.6 mg/dL (ref 8.4–10.4)
Chloride: 106 mEq/L (ref 98–109)
Creatinine: 0.7 mg/dL (ref 0.6–1.1)
GLUCOSE: 100 mg/dL (ref 70–140)
POTASSIUM: 4 meq/L (ref 3.5–5.1)
SODIUM: 142 meq/L (ref 136–145)
Total Bilirubin: 1.38 mg/dL — ABNORMAL HIGH (ref 0.20–1.20)
Total Protein: 7.8 g/dL (ref 6.4–8.3)

## 2015-05-07 LAB — CBC WITH DIFFERENTIAL/PLATELET
BASO%: 1.2 % (ref 0.0–2.0)
BASOS ABS: 0 10*3/uL (ref 0.0–0.1)
EOS ABS: 0.1 10*3/uL (ref 0.0–0.5)
EOS%: 3.5 % (ref 0.0–7.0)
HCT: 39 % (ref 34.8–46.6)
HEMOGLOBIN: 13.4 g/dL (ref 11.6–15.9)
LYMPH%: 38.3 % (ref 14.0–49.7)
MCH: 32.2 pg (ref 25.1–34.0)
MCHC: 34.4 g/dL (ref 31.5–36.0)
MCV: 93.8 fL (ref 79.5–101.0)
MONO#: 0.4 10*3/uL (ref 0.1–0.9)
MONO%: 12.4 % (ref 0.0–14.0)
NEUT#: 1.5 10*3/uL (ref 1.5–6.5)
NEUT%: 44.6 % (ref 38.4–76.8)
Platelets: 321 10*3/uL (ref 145–400)
RBC: 4.16 10*6/uL (ref 3.70–5.45)
RDW: 15.2 % — ABNORMAL HIGH (ref 11.2–14.5)
WBC: 3.4 10*3/uL — AB (ref 3.9–10.3)
lymph#: 1.3 10*3/uL (ref 0.9–3.3)

## 2015-05-07 LAB — LACTATE DEHYDROGENASE: LDH: 166 U/L (ref 125–245)

## 2015-05-07 LAB — PREGNANCY, URINE: PREG TEST UR: NEGATIVE

## 2015-05-07 MED ORDER — HEPARIN SOD (PORK) LOCK FLUSH 100 UNIT/ML IV SOLN
500.0000 [IU] | Freq: Once | INTRAVENOUS | Status: DC
  Filled 2015-05-07: qty 5

## 2015-05-07 MED ORDER — SODIUM CHLORIDE 0.9 % IJ SOLN
10.0000 mL | INTRAMUSCULAR | Status: DC | PRN
  Filled 2015-05-07: qty 10

## 2015-05-07 MED ORDER — DOXORUBICIN HCL CHEMO IV INJECTION 2 MG/ML
20.0000 mg/m2 | Freq: Once | INTRAVENOUS | Status: AC
  Administered 2015-05-07: 32 mg via INTRAVENOUS
  Filled 2015-05-07: qty 16

## 2015-05-07 MED ORDER — HEPARIN SOD (PORK) LOCK FLUSH 100 UNIT/ML IV SOLN
500.0000 [IU] | Freq: Once | INTRAVENOUS | Status: DC | PRN
  Filled 2015-05-07: qty 5

## 2015-05-07 MED ORDER — SODIUM CHLORIDE 0.9 % IV SOLN
Freq: Once | INTRAVENOUS | Status: AC
  Administered 2015-05-07: 13:00:00 via INTRAVENOUS
  Filled 2015-05-07: qty 8

## 2015-05-07 MED ORDER — VINBLASTINE SULFATE CHEMO INJECTION 1 MG/ML
4.5000 mg/m2 | Freq: Once | INTRAVENOUS | Status: AC
  Administered 2015-05-07: 7 mg via INTRAVENOUS
  Filled 2015-05-07: qty 7

## 2015-05-07 MED ORDER — SODIUM CHLORIDE 0.9 % IJ SOLN
10.0000 mL | INTRAMUSCULAR | Status: DC | PRN
  Administered 2015-05-07: 10 mL via INTRAVENOUS
  Filled 2015-05-07: qty 10

## 2015-05-07 MED ORDER — SODIUM CHLORIDE 0.9 % IV SOLN: Freq: Once | INTRAVENOUS | Status: DC

## 2015-05-07 MED ORDER — SODIUM CHLORIDE 0.9 % IV SOLN
300.0000 mg/m2 | Freq: Once | INTRAVENOUS | Status: AC
  Administered 2015-05-07: 460 mg via INTRAVENOUS
  Filled 2015-05-07: qty 23

## 2015-05-07 MED ORDER — SODIUM CHLORIDE 0.9 % IV SOLN
8.0000 [IU]/m2 | Freq: Once | INTRAVENOUS | Status: AC
  Administered 2015-05-07: 12 [IU] via INTRAVENOUS
  Filled 2015-05-07: qty 4

## 2015-05-07 NOTE — Patient Instructions (Signed)
[facility] Discharge Instructions for Patients Receiving Chemotherapy  Today you received the following chemotherapy agents Adriamycin, DTIC, Velban, Bleomycin. To help prevent nausea and vomiting after your treatment, we encourage you to take your nausea medication.  If you develop nausea and vomiting that is not controlled by your nausea medication, call the clinic.   BELOW ARE SYMPTOMS THAT SHOULD BE REPORTED IMMEDIATELY:  *FEVER GREATER THAN 100.5 F  *CHILLS WITH OR WITHOUT FEVER  NAUSEA AND VOMITING THAT IS NOT CONTROLLED WITH YOUR NAUSEA MEDICATION  *UNUSUAL SHORTNESS OF BREATH  *UNUSUAL BRUISING OR BLEEDING  TENDERNESS IN MOUTH AND THROAT WITH OR WITHOUT PRESENCE OF ULCERS  *URINARY PROBLEMS  *BOWEL PROBLEMS  UNUSUAL RASH Items with * indicate a potential emergency and should be followed up as soon as possible.  Feel free to call the clinic you have any questions or concerns. The clinic phone number is [phone].  Please show the [place] at check-in to the Emergency Department and triage nurse.

## 2015-05-21 ENCOUNTER — Other Ambulatory Visit (HOSPITAL_COMMUNITY)
Admission: RE | Admit: 2015-05-21 | Discharge: 2015-05-21 | Disposition: A | Discharge status: Home / Self Care | Payer: Medicaid Other | Source: Ambulatory Visit | Attending: Hematology and Oncology | Admitting: Hematology and Oncology

## 2015-05-21 ENCOUNTER — Encounter: Payer: Self-pay | Admitting: Hematology and Oncology

## 2015-05-21 ENCOUNTER — Ambulatory Visit (HOSPITAL_BASED_OUTPATIENT_CLINIC_OR_DEPARTMENT_OTHER): Payer: Medicaid Other

## 2015-05-21 ENCOUNTER — Ambulatory Visit: Payer: Medicaid Other

## 2015-05-21 ENCOUNTER — Telehealth: Payer: Self-pay | Admitting: Hematology and Oncology

## 2015-05-21 ENCOUNTER — Telehealth: Payer: Self-pay | Admitting: *Deleted

## 2015-05-21 ENCOUNTER — Other Ambulatory Visit (HOSPITAL_BASED_OUTPATIENT_CLINIC_OR_DEPARTMENT_OTHER): Payer: Medicaid Other

## 2015-05-21 ENCOUNTER — Ambulatory Visit (HOSPITAL_BASED_OUTPATIENT_CLINIC_OR_DEPARTMENT_OTHER): Payer: Medicaid Other | Admitting: Hematology and Oncology

## 2015-05-21 VITALS — BP 99/64 | HR 89 | Temp 98.2°F | Resp 18 | Wt 107.4 lb

## 2015-05-21 VITALS — BP 115/66 | HR 69 | Temp 98.2°F | Resp 18

## 2015-05-21 DIAGNOSIS — B20 Human immunodeficiency virus [HIV] disease: Secondary | ICD-10-CM | POA: Diagnosis not present

## 2015-05-21 DIAGNOSIS — Z95828 Presence of other vascular implants and grafts: Secondary | ICD-10-CM

## 2015-05-21 DIAGNOSIS — Z720 Tobacco use: Secondary | ICD-10-CM | POA: Diagnosis not present

## 2015-05-21 DIAGNOSIS — Z5111 Encounter for antineoplastic chemotherapy: Secondary | ICD-10-CM

## 2015-05-21 DIAGNOSIS — C8174 Other classical Hodgkin lymphoma, lymph nodes of axilla and upper limb: Secondary | ICD-10-CM

## 2015-05-21 DIAGNOSIS — D702 Other drug-induced agranulocytosis: Secondary | ICD-10-CM

## 2015-05-21 DIAGNOSIS — D701 Agranulocytosis secondary to cancer chemotherapy: Secondary | ICD-10-CM | POA: Diagnosis not present

## 2015-05-21 DIAGNOSIS — Z3200 Encounter for pregnancy test, result unknown: Secondary | ICD-10-CM | POA: Insufficient documentation

## 2015-05-21 DIAGNOSIS — C8190 Hodgkin lymphoma, unspecified, unspecified site: Secondary | ICD-10-CM

## 2015-05-21 DIAGNOSIS — C8170 Other classical Hodgkin lymphoma, unspecified site: Secondary | ICD-10-CM

## 2015-05-21 DIAGNOSIS — M545 Low back pain: Secondary | ICD-10-CM

## 2015-05-21 LAB — CBC WITH DIFFERENTIAL/PLATELET
BASO%: 1.2 % (ref 0.0–2.0)
Basophils Absolute: 0 10*3/uL (ref 0.0–0.1)
EOS%: 1.7 % (ref 0.0–7.0)
Eosinophils Absolute: 0 10*3/uL (ref 0.0–0.5)
HCT: 38.4 % (ref 34.8–46.6)
HEMOGLOBIN: 13 g/dL (ref 11.6–15.9)
LYMPH%: 54 % — AB (ref 14.0–49.7)
MCH: 31.7 pg (ref 25.1–34.0)
MCHC: 33.7 g/dL (ref 31.5–36.0)
MCV: 94 fL (ref 79.5–101.0)
MONO#: 0.7 10*3/uL (ref 0.1–0.9)
MONO%: 34.3 % — AB (ref 0.0–14.0)
NEUT%: 8.8 % — ABNORMAL LOW (ref 38.4–76.8)
NEUTROS ABS: 0.2 10*3/uL — AB (ref 1.5–6.5)
Platelets: 301 10*3/uL (ref 145–400)
RBC: 4.09 10*6/uL (ref 3.70–5.45)
RDW: 15.6 % — AB (ref 11.2–14.5)
WBC: 2 10*3/uL — AB (ref 3.9–10.3)
lymph#: 1.1 10*3/uL (ref 0.9–3.3)

## 2015-05-21 LAB — COMPREHENSIVE METABOLIC PANEL
ALBUMIN: 3.8 g/dL (ref 3.5–5.0)
ALK PHOS: 76 U/L (ref 40–150)
ALT: 20 U/L (ref 0–55)
AST: 17 U/L (ref 5–34)
Anion Gap: 8 mEq/L (ref 3–11)
BILIRUBIN TOTAL: 0.35 mg/dL (ref 0.20–1.20)
BUN: 5.4 mg/dL — AB (ref 7.0–26.0)
CO2: 27 meq/L (ref 22–29)
Calcium: 9.3 mg/dL (ref 8.4–10.4)
Chloride: 106 mEq/L (ref 98–109)
Creatinine: 0.7 mg/dL (ref 0.6–1.1)
EGFR: >90 mL/min/{1.73_m2} (ref >90)
GLUCOSE: 96 mg/dL (ref 70–140)
POTASSIUM: 3.8 meq/L (ref 3.5–5.1)
SODIUM: 141 meq/L (ref 136–145)
TOTAL PROTEIN: 7.2 g/dL (ref 6.4–8.3)

## 2015-05-21 LAB — PREGNANCY, URINE: Preg Test, Ur: NEGATIVE

## 2015-05-21 MED ORDER — HYDROMORPHONE HCL 4 MG/ML IJ SOLN
1.0000 mg | Freq: Once | INTRAMUSCULAR | Status: AC
  Administered 2015-05-21: 1 mg via INTRAVENOUS

## 2015-05-21 MED ORDER — LEUPROLIDE ACETATE 7.5 MG IM KIT
7.5000 mg | PACK | INTRAMUSCULAR | Status: DC
  Administered 2015-05-21: 7.5 mg via INTRAMUSCULAR
  Filled 2015-05-21: qty 7.5

## 2015-05-21 MED ORDER — HYDROMORPHONE HCL 4 MG/ML IJ SOLN
INTRAMUSCULAR | Status: AC
  Filled 2015-05-21: qty 1

## 2015-05-21 MED ORDER — SODIUM CHLORIDE 0.9 % IV SOLN
Freq: Once | INTRAVENOUS | Status: AC
  Administered 2015-05-21: 09:00:00 via INTRAVENOUS

## 2015-05-21 MED ORDER — SODIUM CHLORIDE 0.9 % IV SOLN
300.0000 mg/m2 | Freq: Once | INTRAVENOUS | Status: AC
  Administered 2015-05-21: 460 mg via INTRAVENOUS
  Filled 2015-05-21: qty 23

## 2015-05-21 MED ORDER — SODIUM CHLORIDE 0.9 % IJ SOLN
10.0000 mL | INTRAMUSCULAR | Status: DC | PRN
  Administered 2015-05-21: 10 mL via INTRAVENOUS
  Filled 2015-05-21: qty 10

## 2015-05-21 MED ORDER — SODIUM CHLORIDE 0.9 % IV SOLN
Freq: Once | INTRAVENOUS | Status: AC
  Administered 2015-05-21: 10:00:00 via INTRAVENOUS
  Filled 2015-05-21: qty 8

## 2015-05-21 MED ORDER — SODIUM CHLORIDE 0.9 % IJ SOLN
10.0000 mL | INTRAMUSCULAR | Status: DC | PRN
  Administered 2015-05-21: 10 mL
  Filled 2015-05-21: qty 10

## 2015-05-21 MED ORDER — VINBLASTINE SULFATE CHEMO INJECTION 1 MG/ML
4.5000 mg/m2 | Freq: Once | INTRAVENOUS | Status: AC
  Administered 2015-05-21: 7 mg via INTRAVENOUS
  Filled 2015-05-21: qty 7

## 2015-05-21 MED ORDER — SODIUM CHLORIDE 0.9 % IV SOLN
Freq: Once | INTRAVENOUS | Status: AC
  Administered 2015-05-21: 12:00:00 via INTRAVENOUS

## 2015-05-21 MED ORDER — SODIUM CHLORIDE 0.9 % IV SOLN
8.0000 [IU]/m2 | Freq: Once | INTRAVENOUS | Status: AC
  Administered 2015-05-21: 12 [IU] via INTRAVENOUS
  Filled 2015-05-21: qty 4

## 2015-05-21 MED ORDER — HEPARIN SOD (PORK) LOCK FLUSH 100 UNIT/ML IV SOLN
500.0000 [IU] | Freq: Once | INTRAVENOUS | Status: AC | PRN
  Administered 2015-05-21: 500 [IU]
  Filled 2015-05-21: qty 5

## 2015-05-21 MED ORDER — DOXORUBICIN HCL CHEMO IV INJECTION 2 MG/ML
20.0000 mg/m2 | Freq: Once | INTRAVENOUS | Status: AC
  Administered 2015-05-21: 32 mg via INTRAVENOUS
  Filled 2015-05-21: qty 16

## 2015-05-21 NOTE — Assessment & Plan Note (Signed)
Originally, I increased her back to full doses due to improve mild liver function tests. Unfortunately, she developed infectious complication and diarrhea. Subsequently, I reduced all her chemotherapy by 20% due to recent side effects. Clinically, she appears to be responding to treatment with resolution of palpable lymphadenopathy on her left neck & positive response on PET scan I review her PET scan at the most recent hematology tumor board. She have one indeterminate lesion on her back but she is not symptomatic. She has near complete resolution of all disease. I recommend we proceed with 2 more cycles of treatment and then finish off with another imaging study, planned for March 2017

## 2015-05-21 NOTE — Patient Instructions (Signed)
[facility] Discharge Instructions for Patients Receiving Chemotherapy  Today you received the following chemotherapy agents: Adriamycin, Bleomycin, Vincristine, Dacarbazine and Leupron.  To help prevent nausea and vomiting after your treatment, we encourage you to take your nausea medication. If you develop nausea and vomiting that is not controlled by your nausea medication, call the clinic.   BELOW ARE SYMPTOMS THAT SHOULD BE REPORTED IMMEDIATELY:  *FEVER GREATER THAN 100.5 F  *CHILLS WITH OR WITHOUT FEVER  NAUSEA AND VOMITING THAT IS NOT CONTROLLED WITH YOUR NAUSEA MEDICATION  *UNUSUAL SHORTNESS OF BREATH  *UNUSUAL BRUISING OR BLEEDING  TENDERNESS IN MOUTH AND THROAT WITH OR WITHOUT PRESENCE OF ULCERS  *URINARY PROBLEMS  *BOWEL PROBLEMS  UNUSUAL RASH Items with * indicate a potential emergency and should be followed up as soon as possible.  Feel free to call the clinic you have any questions or concerns. The clinic phone number is [phone].  Please show the [facility] at check-in to the Emergency Department and triage nurse.

## 2015-05-21 NOTE — Assessment & Plan Note (Signed)
I spent some time counseling the patient the importance of tobacco cessation. she is currently attempting to quit on her own 

## 2015-05-21 NOTE — Assessment & Plan Note (Signed)
This is likely due to recent treatment. The patient denies recent history of fevers, cough, chills, diarrhea or dysuria. She is asymptomatic from the leukopenia. I will observe for now.  I will continue the chemotherapy at current dose without dosage adjustment.  If the leukopenia gets progressive worse in the future, I might have to delay her treatment or adjust the chemotherapy dose.   

## 2015-05-21 NOTE — Progress Notes (Signed)
11:45 am; Pt c/o sudden onset acute lower back pain of 10/10  just as DTIC completing (aprox 10 cc left in bag).   Pt was squirming in seat, grimacing and appeared in distress.  She denied any sob or any other symptoms.  Denied any prior back pain and had been resting comfortably during her treatment today until it was just about complete.  Chemo stopped,  IVFs started and VSS.   Notified Dr. Alvy Bimler and she ordered dilaudid and IVFs.  See MAR.  Pain decreased significantly w/ dilaudid.   Pt was anxious to leave after her pain was better and did not want to stay for the entire liter of NS.   Her VS were stable and since her pain was relieved and no other symptoms,  Dr. Alvy Bimler said it was ok to D/C.   Pt d/c'd in stable condition.

## 2015-05-21 NOTE — Telephone Encounter (Signed)
Per staff message and POF I have scheduled appts. Advised scheduler of appts. JMW  

## 2015-05-21 NOTE — Progress Notes (Signed)
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   05/07/2014 Imaging CT scan of the chest show right thoracic inlet lymphadenopathy   06/07/2014 Procedure She underwent ultrasound-guided core biopsy of the neck lymph node   06/07/2014 Pathology Results Accession: [id number] biopsy confirmed diagnosis of Hodgkin lymphoma.   06/15/2014 Imaging Echocardiogram showed preserved ejection fraction   07/09/2014 - 07/12/2014 Hospital Admission She was admitted to the hospital for severe anemia.   07/27/2014 Procedure She had placement of port   07/31/2014 - 09/11/2014 Chemotherapy She received dose adjusted chemotherapy due to abnormal liver function tests and severe anemia. Treatment was delayed due to noncompliance  and subsequently stopped because the patient failed to keep appointments   01/11/2015 Imaging Repeat PET CT scan showed response to treatment   01/28/2015 -  Chemotherapy ABVD was restarted with full dose.   02/08/2015 - 02/10/2015 Hospital Admission The patient was admitted to the hospital due to pancytopenia and profuse diarrhea. Cultures were negative. She was placed on ciprofloxacin.   02/11/2015 Adverse Reaction Treatment was placed on hold due to recent infection.   04/11/2015 Imaging PET CT scan showed near complete response. Incidental finding of an abnormal bone lesion, indeterminate. She is not symptomatic. Recommendation from Hem [facility] to observe    INTERVAL HISTORY: Please see below for problem oriented charting.  She returns to see me prior to cycle 5, day 15 of treatment. She tolerated  treatment well without any side effects. Denies new lymphadenopathy. No recent infection. Denies peripheral neuropathy. She is compliant taking all her medications as prescribed  REVIEW OF SYSTEMS:   Constitutional: Denies fevers, chills or abnormal weight loss Eyes: Denies blurriness of vision Ears, nose, mouth, throat, and face: Denies mucositis or sore throat Respiratory: Denies cough, dyspnea or wheezes Cardiovascular: Denies palpitation, chest discomfort or lower extremity swelling Gastrointestinal:  Denies nausea, heartburn or change in bowel habits Skin: Denies abnormal skin rashes Lymphatics: Denies new lymphadenopathy or easy bruising Neurological:Denies numbness, tingling or new weaknesses Behavioral/Psych: Mood is stable, no new changes  All other systems were reviewed with the patient and are negative.  I have reviewed the past medical history, past surgical history, social history and family history with the patient and they are unchanged from previous note.  ALLERGIES:  has No Known Allergies.  MEDICATIONS:  Current Outpatient Prescriptions  Medication Sig Dispense Refill  . buPROPion (WELLBUTRIN XL) 150 MG 24 hr tablet Take 150 mg by mouth at bedtime.     Marland Kitchen emtricitabine-tenofovir (TRUVADA) 200-300 MG per tablet Take 1 tablet by mouth daily. 30 tablet 5  . HYDROcodone-acetaminophen (NORCO) 5-325 MG tablet Take 1 tablet by mouth every 6 (six) hours as needed. 20 tablet 0  . hydrocortisone cream 0.5 % Apply 1 application topically 2 (two) times daily. 30 g 1  . ketoconazole (NIZORAL) 2 % cream Apply 1 application topically 2 (two) times daily as needed for irritation. 60 g 3  . lidocaine-prilocaine (EMLA) cream Apply to affected area once 30 g 3  . Naphazoline HCl (CLEAR EYES OP) Apply 2 drops to eye 3 (three) times daily as needed (redness).     Marland Kitchen  REYATAZ 300 MG capsule TAKE ONE CAPSULE BY MOUTH DAILY WITH BREAKFAST. TAKE WITH NORVIR 30 capsule 0  . ritonavir (NORVIR)  100 MG TABS tablet Take 1 tablet (100 mg total) by mouth daily with breakfast. TAKE WITH REYATAZ 30 tablet 5  . traZODone (DESYREL) 50 MG tablet Take 50 mg by mouth at bedtime.    Marland Kitchen ibuprofen (ADVIL,MOTRIN) 200 MG tablet Take 2 tablets (400 mg total) by mouth every 6 (six) hours as needed for moderate pain. (Patient not taking: Reported on 05/21/2015) 30 tablet 0  . ondansetron (ZOFRAN ODT) 8 MG disintegrating tablet Take 1 tablet (8 mg total) by mouth every 8 (eight) hours as needed for nausea or vomiting. (Patient not taking: Reported on 05/21/2015) 15 tablet 0  . ondansetron (ZOFRAN) 4 MG tablet Take 1 tablet (4 mg total) by mouth every 6 (six) hours. (Patient not taking: Reported on 05/21/2015) 15 tablet 0  . potassium chloride SA (K-DUR,KLOR-CON) 20 MEQ tablet Take 1 tablet (20 mEq total) by mouth 2 (two) times daily. (Patient not taking: Reported on 04/23/2015) 60 tablet 0   No current facility-administered medications for this visit.   Facility-Administered Medications Ordered in Other Visits  Medication Dose Route Frequency Provider Last Rate Last Dose  . 0.9 %  sodium chloride infusion   Intravenous Once Heath Lark, MD      . bleomycin (BLEOCIN) 12 Units in sodium chloride 0.9 % 50 mL chemo infusion  8 Units/m2 (Treatment Plan Actual) Intravenous Once Heath Lark, MD      . dacarbazine (DTIC) 460 mg in sodium chloride 0.9 % 250 mL chemo infusion  300 mg/m2 (Treatment Plan Actual) Intravenous Once Heath Lark, MD      . DOXOrubicin (ADRIAMYCIN) chemo injection 32 mg  20 mg/m2 (Treatment Plan Actual) Intravenous Once Heath Lark, MD      . heparin lock flush 100 unit/mL  500 Units Intracatheter Once PRN Heath Lark, MD      . leuprolide (LUPRON) injection 7.5 mg  7.5 mg Intramuscular Q28 days Heath Lark, MD      . ondansetron (ZOFRAN) 16 mg, dexamethasone (DECADRON) 20 mg in sodium chloride 0.9 % 50 mL IVPB   Intravenous Once Heath Lark, MD      . sodium chloride 0.9 % injection 10 mL  10 mL  Intracatheter PRN Heath Lark, MD      . vinBLAStine (VELBAN) 7.4 mg in sodium chloride 0.9 % 50 mL chemo infusion  4.8 mg/m2 (Treatment Plan Actual) Intravenous Once Heath Lark, MD        PHYSICAL EXAMINATION: ECOG PERFORMANCE STATUS: 0 - Asymptomatic  Filed Vitals:   05/21/15 0842  BP: 99/64  Pulse: 89  Temp: 98.2 F (36.8 C)  Resp: 18   Filed Weights   05/21/15 0842  Weight: 107 lb 6.4 oz (48.716 kg)    GENERAL:alert, no distress and comfortable SKIN: skin color, texture, turgor are normal, no rashes or significant lesions EYES: normal, Conjunctiva are pink and non-injected, sclera clear OROPHARYNX:no exudate, no erythema and lips, buccal mucosa, and tongue normal  NECK: supple, thyroid normal size, non-tender, without nodularity LYMPH:  no palpable lymphadenopathy in the cervical, axillary or inguinal LUNGS: clear to auscultation and percussion with normal breathing effort HEART: regular rate & rhythm and no murmurs and no lower extremity edema ABDOMEN:abdomen soft, non-tender and normal bowel sounds Musculoskeletal:no cyanosis of digits and no clubbing  NEURO: alert & oriented x 3 with fluent speech, no focal motor/sensory deficits  LABORATORY DATA:  I have reviewed the data as listed    Component Value Date/Time   NA 141 05/21/2015 0812   NA 139 02/10/2015 1202   K 3.8 05/21/2015 0812   K 3.4* 02/10/2015 1202   CL 108 02/10/2015 1202   CO2 27 05/21/2015 0812   CO2 26 02/10/2015 1202   GLUCOSE 96 05/21/2015 0812   GLUCOSE 81 02/10/2015 1202   BUN 5.4* 05/21/2015 0812   BUN <5* 02/10/2015 1202   CREATININE 0.7 05/21/2015 0812   CREATININE 0.77 02/10/2015 1202   CREATININE 0.66 12/04/2014 1014   CALCIUM 9.3 05/21/2015 0812   CALCIUM 8.4* 02/10/2015 1202   PROT 7.2 05/21/2015 0812   PROT 7.1 02/08/2015 1158   ALBUMIN 3.8 05/21/2015 0812   ALBUMIN 3.9 02/08/2015 1158   AST 17 05/21/2015 0812   AST 25 02/08/2015 1158   ALT 20 05/21/2015 0812   ALT 22  02/08/2015 1158   ALKPHOS 76 05/21/2015 0812   ALKPHOS 66 02/08/2015 1158   BILITOT 0.35 05/21/2015 0812   BILITOT 0.5 02/08/2015 1158   GFRNONAA >60 02/10/2015 1202   GFRNONAA >89 12/04/2014 1014   GFRAA >60 02/10/2015 1202   GFRAA >89 12/04/2014 1014    No results found for: SPEP, UPEP  Lab Results  Component Value Date   WBC 2.0* 05/21/2015   NEUTROABS 0.2* 05/21/2015   HGB 13.0 05/21/2015   HCT 38.4 05/21/2015   MCV 94.0 05/21/2015   PLT 301 05/21/2015      Chemistry      Component Value Date/Time   NA 141 05/21/2015 0812   NA 139 02/10/2015 1202   K 3.8 05/21/2015 0812   K 3.4* 02/10/2015 1202   CL 108 02/10/2015 1202   CO2 27 05/21/2015 0812   CO2 26 02/10/2015 1202   BUN 5.4* 05/21/2015 0812   BUN <5* 02/10/2015 1202   CREATININE 0.7 05/21/2015 0812   CREATININE 0.77 02/10/2015 1202   CREATININE 0.66 12/04/2014 1014      Component Value Date/Time   CALCIUM 9.3 05/21/2015 0812   CALCIUM 8.4* 02/10/2015 1202   ALKPHOS 76 05/21/2015 0812   ALKPHOS 66 02/08/2015 1158   AST 17 05/21/2015 0812   AST 25 02/08/2015 1158   ALT 20 05/21/2015 0812   ALT 22 02/08/2015 1158   BILITOT 0.35 05/21/2015 0812   BILITOT 0.5 02/08/2015 1158     ASSESSMENT & PLAN:  Other classical hodgkin lymphoma, 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 Unfortunately, she developed infectious complication and diarrhea. Subsequently, I reduced all her chemotherapy by 20% due to recent side effects. Clinically, she appears to be responding to treatment with resolution of palpable lymphadenopathy on her left neck & positive response on PET scan I review her PET scan at the most recent hematology tumor board. She have one indeterminate lesion on her back but she is not symptomatic. She has near complete resolution of all disease. I recommend we proceed with 2 more cycles of treatment and then finish off  with another imaging study, planned for March 2017  Human immunodeficiency virus (HIV) disease ([place]) She continues taking anti-retroviral treatment. Again, I reinforced the importance of her keeping her appointments with infectious disease.    Drug-induced neutropenia ([place]) This is likely due to recent treatment. The patient denies recent history of fevers, cough, chills, diarrhea or dysuria. She is asymptomatic from the leukopenia. I will observe for now.  I will continue the chemotherapy at current dose without dosage adjustment.  If the leukopenia gets progressive worse in the future, I might have to delay her treatment or adjust the chemotherapy dose.   Tobacco abuse I spent some time counseling the patient the importance of tobacco cessation. she is currently attempting to quit on her own     No orders of the defined types were placed in this encounter.   All questions were answered. The patient knows to call the clinic with any problems, questions or concerns. No barriers to learning was detected. I spent 20 minutes counseling the patient face to face. The total time spent in the appointment was 30 minutes and more than 50% was on counseling and review of test results     [facility], Witt, MD 05/21/2015 9:24 AM

## 2015-05-21 NOTE — Assessment & Plan Note (Signed)
She continues taking anti-retroviral treatment. Again, I reinforced the importance of her keeping her appointments with infectious disease.

## 2015-05-21 NOTE — Patient Instructions (Signed)

## 2015-05-21 NOTE — Telephone Encounter (Signed)
per pof to sch pt appt-sent MW to sch trmt-pt to get updated copy of avs b4 leaving**

## 2015-05-30 ENCOUNTER — Emergency Department (HOSPITAL_COMMUNITY)
Admission: EM | Admit: 2015-05-30 | Discharge: 2015-05-30 | Disposition: A | Discharge status: Home / Self Care | Payer: Medicaid Other | Attending: Emergency Medicine | Admitting: Emergency Medicine

## 2015-05-30 ENCOUNTER — Encounter (HOSPITAL_COMMUNITY): Payer: Self-pay | Admitting: Emergency Medicine

## 2015-05-30 DIAGNOSIS — F329 Major depressive disorder, single episode, unspecified: Secondary | ICD-10-CM | POA: Diagnosis not present

## 2015-05-30 DIAGNOSIS — R21 Rash and other nonspecific skin eruption: Secondary | ICD-10-CM | POA: Diagnosis not present

## 2015-05-30 DIAGNOSIS — Z8571 Personal history of Hodgkin lymphoma: Secondary | ICD-10-CM | POA: Diagnosis not present

## 2015-05-30 DIAGNOSIS — F17210 Nicotine dependence, cigarettes, uncomplicated: Secondary | ICD-10-CM | POA: Diagnosis not present

## 2015-05-30 DIAGNOSIS — Z85048 Personal history of other malignant neoplasm of rectum, rectosigmoid junction, and anus: Secondary | ICD-10-CM | POA: Diagnosis not present

## 2015-05-30 DIAGNOSIS — Z8619 Personal history of other infectious and parasitic diseases: Secondary | ICD-10-CM | POA: Insufficient documentation

## 2015-05-30 DIAGNOSIS — Z859 Personal history of malignant neoplasm, unspecified: Secondary | ICD-10-CM | POA: Insufficient documentation

## 2015-05-30 DIAGNOSIS — B20 Human immunodeficiency virus [HIV] disease: Secondary | ICD-10-CM | POA: Insufficient documentation

## 2015-05-30 DIAGNOSIS — Z8719 Personal history of other diseases of the digestive system: Secondary | ICD-10-CM | POA: Insufficient documentation

## 2015-05-30 DIAGNOSIS — Z8709 Personal history of other diseases of the respiratory system: Secondary | ICD-10-CM | POA: Insufficient documentation

## 2015-05-30 DIAGNOSIS — Z862 Personal history of diseases of the blood and blood-forming organs and certain disorders involving the immune mechanism: Secondary | ICD-10-CM | POA: Diagnosis not present

## 2015-05-30 DIAGNOSIS — E876 Hypokalemia: Secondary | ICD-10-CM | POA: Diagnosis not present

## 2015-05-30 DIAGNOSIS — Z79899 Other long term (current) drug therapy: Secondary | ICD-10-CM | POA: Insufficient documentation

## 2015-05-30 DIAGNOSIS — Z7952 Long term (current) use of systemic steroids: Secondary | ICD-10-CM | POA: Insufficient documentation

## 2015-05-30 MED ORDER — DIPHENHYDRAMINE HCL 25 MG PO CAPS: 25.0000 mg | ORAL_CAPSULE | Freq: Four times a day (QID) | ORAL | Status: DC | PRN

## 2015-05-30 MED ORDER — MUPIROCIN CALCIUM 2 % EX CREA: 1.0000 "application " | TOPICAL_CREAM | Freq: Three times a day (TID) | CUTANEOUS | Status: DC

## 2015-05-30 MED ORDER — PREDNISONE 20 MG PO TABS: 40.0000 mg | ORAL_TABLET | Freq: Every day | ORAL | Status: DC

## 2015-05-30 NOTE — Discharge Instructions (Signed)
Rash A rash is a change in the color or texture of the skin. There are many different types of rashes. You may have other problems that accompany your rash. CAUSES   Infections.  Allergic reactions. This can include allergies to pets or foods.  Certain medicines.  Exposure to certain chemicals, soaps, or cosmetics.  Heat.  Exposure to poisonous plants.  Tumors, both cancerous and noncancerous. SYMPTOMS   Redness.  Scaly skin.  Itchy skin.  Dry or cracked skin.  Bumps.  Blisters.  Pain. DIAGNOSIS  Your caregiver may do a physical exam to determine what type of rash you have. A skin sample (biopsy) may be taken and examined under a microscope. TREATMENT  Treatment depends on the type of rash you have. Your caregiver may prescribe certain medicines. For serious conditions, you may need to see a skin doctor (dermatologist). HOME CARE INSTRUCTIONS   Avoid the substance that caused your rash.  Do not scratch your rash. This can cause infection.  You may take cool baths to help stop itching.  Only take over-the-counter or prescription medicines as directed by your caregiver.  Keep all follow-up appointments as directed by your caregiver. SEEK IMMEDIATE MEDICAL CARE IF:  You have increasing pain, swelling, or redness.  You have a fever.  You have new or severe symptoms.  You have body aches, diarrhea, or vomiting.  Your rash is not better after 3 days. MAKE SURE YOU:  Understand these instructions.  Will watch your condition.  Will get help right away if you are not doing well or get worse.   This information is not intended to replace advice given to you by your health care provider. Make sure you discuss any questions you have with your health care provider.   Document Released: 04/03/2002 Document Revised: 05/04/2014 Document Reviewed: 08/29/2014 Elsevier Interactive Patient Education 2016 [employer]  

## 2015-05-30 NOTE — ED Provider Notes (Signed)
CSN: [id number]     Arrival date & time 05/30/15  1025 History  By signing my name below, I, Essence Howell, attest that this documentation has been prepared under the direction and in the presence of Gloriann Loan, PA-C Electronically Signed: Ladene Artist, ED Scribe 05/30/2015 at 10:47 AM.   Chief Complaint  Patient presents with  . Rash   The history is provided by the patient. No language interpreter was used.   HPI Comments: ANAIE SEAMON is a 30 y.o. female, with a h/o HIV and lymphoma, who presents to the Emergency Department complaining of mild, constant, pruritic rash to left face onset 3 days ago. Pt suspects that rash may be attributed to orange juice. She has tried hydrocortisone cream and alcohol without significant relief. Pt denies new medications, soaps, detergents, facial cleansers. She also denies fever, SOB, throat swelling, nausea, vomiting, drainage from the rash.   Past Medical History  Diagnosis Date  . HIV (human immunodeficiency virus infection) ([place])   . Depression   . Anemia   . HSV (herpes simplex virus) infection   . History of shingles   . AIN III (anal intraepithelial neoplasia III)   . Condyloma acuminatum in female   . History of chronic bronchitis   . History of esophagitis     CANDIDA  . Periodontitis, chronic   . Hodgkin's lymphoma ([place]) 06/12/2014  . Cancer ([place])     Hodgkin lymphoma  . Hypokalemia 07/17/2014  . Screening examination for venereal disease 10/30/2013   Past Surgical History  Procedure Laterality Date  . Dilation and curettage of uterus  2005    MISSED AB  . Examination under anesthesia N/A 09/23/2012    Procedure: EXAM UNDER ANESTHESIA;  Surgeon: Adin Hector, MD;  Location: [place];  Service: General;  Laterality: N/A;  . Laser ablation condolamata N/A 09/23/2012    Procedure: REMOVAL/ABLATION  ABLATION CONDOLAMATA WARTS;  Surgeon: Adin Hector, MD;  Location: [facility];  Service: General;   Laterality: N/A;   Family History  Problem Relation Age of Onset  . Cancer Maternal Aunt     unknown ca  . Cancer Maternal Grandmother     unknown ca   Social History  Substance Use Topics  . Smoking status: Current Every Day Smoker -- 0.00 packs/day for 7 years    Types: Cigars    Start date: 03/19/2014  . Smokeless tobacco: Never Used     Comment: she smokes 3 Black and Mild Cigars daily  . Alcohol Use: No   OB History    Gravida Para Term Preterm AB TAB SAB Ectopic Multiple Living   4 0 0  2  1 1   0     Review of Systems  Constitutional: Negative for fever.  Respiratory: Negative for shortness of breath.   Gastrointestinal: Negative for nausea and vomiting.  Skin: Positive for rash.  All other systems reviewed and are negative.  Allergies  Review of patient's allergies indicates no known allergies.  Home Medications   Prior to Admission medications   Medication Sig Start Date End Date Taking? Authorizing Provider  buPROPion (WELLBUTRIN XL) 150 MG 24 hr tablet Take 150 mg by mouth at bedtime.     Historical Provider, MD  diphenhydrAMINE (BENADRYL) 25 mg capsule Take 1 capsule (25 mg total) by mouth every 6 (six) hours as needed. 05/30/15   Gloriann Loan, PA-C  emtricitabine-tenofovir (TRUVADA) 200-300 MG per tablet Take 1 tablet by mouth daily.  09/12/14   Truman Hayward, MD  HYDROcodone-acetaminophen [facility]) 5-325 MG tablet Take 1 tablet by mouth every 6 (six) hours as needed. 02/10/15   Theodis Blaze, MD  hydrocortisone cream 0.5 % Apply 1 application topically 2 (two) times daily. 12/19/14   Thayer Headings, MD  ibuprofen (ADVIL,MOTRIN) 200 MG tablet Take 2 tablets (400 mg total) by mouth every 6 (six) hours as needed for moderate pain. Patient not taking: Reported on 05/21/2015 04/24/15   Heath Lark, MD  ketoconazole (NIZORAL) 2 % cream Apply 1 application topically 2 (two) times daily as needed for irritation. 12/25/14   Thayer Headings, MD  lidocaine-prilocaine (EMLA)  cream Apply to affected area once 06/26/14   Heath Lark, MD  mupirocin cream (BACTROBAN) 2 % Apply 1 application topically 3 (three) times daily. For 5 days. 05/30/15   Gloriann Loan, PA-C  Naphazoline HCl (CLEAR EYES OP) Apply 2 drops to eye 3 (three) times daily as needed (redness).     Historical Provider, MD  ondansetron (ZOFRAN ODT) 8 MG disintegrating tablet Take 1 tablet (8 mg total) by mouth every 8 (eight) hours as needed for nausea or vomiting. Patient not taking: Reported on 05/21/2015 02/07/15   Tatyana Kirichenko, PA-C  ondansetron (ZOFRAN) 4 MG tablet Take 1 tablet (4 mg total) by mouth every 6 (six) hours. Patient not taking: Reported on 05/21/2015 01/10/15   Harvel Quale, MD  potassium chloride SA (K-DUR,KLOR-CON) 20 MEQ tablet Take 1 tablet (20 mEq total) by mouth 2 (two) times daily. Patient not taking: Reported on 04/23/2015 07/17/14   Heath Lark, MD  predniSONE (DELTASONE) 20 MG tablet Take 2 tablets (40 mg total) by mouth daily. 05/30/15   Martia Dalby, PA-C  REYATAZ 300 MG capsule TAKE ONE CAPSULE BY MOUTH DAILY WITH BREAKFAST. TAKE WITH NORVIR 04/30/15   Thayer Headings, MD  ritonavir (NORVIR) 100 MG TABS tablet Take 1 tablet (100 mg total) by mouth daily with breakfast. TAKE WITH REYATAZ 09/12/14   Truman Hayward, MD  traZODone (DESYREL) 50 MG tablet Take 50 mg by mouth at bedtime.    Historical Provider, MD   BP 101/68 mmHg  Pulse 72  Temp(Src) 97.9 F (36.6 C) (Oral)  Resp 20  Ht 5\' 4"  (1.626 m)  Wt 100 lb (45.36 kg)  BMI 17.16 kg/m2  SpO2 99% Physical Exam  Constitutional: She is oriented to person, place, and time. She appears well-developed and well-nourished.  HENT:  Head: Normocephalic and atraumatic.    Airway patent.  Patient able to speak in full sentences without difficulty.  No facial, tongue swelling, or signs of angioedema.   Eyes: Conjunctivae are normal. No scleral icterus.  Neck: No tracheal deviation present.  Pulmonary/Chest: Effort normal. No  respiratory distress.  Musculoskeletal: Normal range of motion.  Neurological: She is alert and oriented to person, place, and time.  Skin: Skin is warm and dry. Rash noted.  Flesh colored, non-tender, maculopapular rash along left peri-oral region.  No crusting, weeping, drainage, or vesicles.  Evidence of excoriation.  No erythema, warmth, induration, fluctuance, or signs of infection.   Psychiatric: She has a normal mood and affect. Her behavior is normal.   ED Course  Procedures (including critical care time) DIAGNOSTIC STUDIES: Oxygen Saturation is 99% on RA, normal by my interpretation.    COORDINATION OF CARE: 10:46 AM-Discussed treatment plan which includes Benadryl and Prednisone with pt at bedside and pt agreed to plan.   Labs Review Labs  Reviewed - No data to display  Imaging Review No results found.   EKG Interpretation None      MDM   Final diagnoses:  Rash    Patient with rash to peri-oral area on the left side.  No systemic symptoms.  Doubt anaphylaxis.  Doubt peri-oral dermatitis.  One excoriated area without obvious infection, but will cover with mupirocin.  Patient treated with prednisone and benadryl.  Follow up PCP. Discussed return precautions.  Patient agrees and acknowledges the above plan for discharge.  I personally performed the services described in this documentation, which was scribed in my presence. The recorded information has been reviewed and is accurate.    Gloriann Loan, PA-C 05/30/15 1100  Julianne Rice, MD 05/30/15 1540

## 2015-05-30 NOTE — ED Notes (Signed)
Had [place] call pharmacy to come do med rec.

## 2015-05-30 NOTE — ED Notes (Signed)
Patient states she has rash on L face that started 2 x days ago.  Patient states she thinks she has an allergy to orange juice.   Denies other symptoms.   Patient has been using hydrocortisone cream and alcohol at home for treatment.

## 2015-06-02 ENCOUNTER — Other Ambulatory Visit: Payer: Self-pay | Admitting: Internal Medicine

## 2015-06-03 ENCOUNTER — Other Ambulatory Visit: Payer: Self-pay | Admitting: *Deleted

## 2015-06-03 DIAGNOSIS — B20 Human immunodeficiency virus [HIV] disease: Secondary | ICD-10-CM

## 2015-06-03 MED ORDER — RITONAVIR 100 MG PO TABS: 100.0000 mg | ORAL_TABLET | Freq: Every day | ORAL | Status: DC

## 2015-06-03 MED ORDER — EMTRICITABINE-TENOFOVIR DF 200-300 MG PO TABS: 1.0000 | ORAL_TABLET | Freq: Every day | ORAL | Status: DC

## 2015-06-03 MED ORDER — ATAZANAVIR SULFATE 300 MG PO CAPS: ORAL_CAPSULE | ORAL | Status: DC

## 2015-06-04 ENCOUNTER — Ambulatory Visit (HOSPITAL_BASED_OUTPATIENT_CLINIC_OR_DEPARTMENT_OTHER): Payer: Medicaid Other

## 2015-06-04 ENCOUNTER — Ambulatory Visit: Payer: Medicaid Other

## 2015-06-04 ENCOUNTER — Other Ambulatory Visit (HOSPITAL_BASED_OUTPATIENT_CLINIC_OR_DEPARTMENT_OTHER): Payer: Medicaid Other

## 2015-06-04 ENCOUNTER — Other Ambulatory Visit (HOSPITAL_COMMUNITY)
Admission: RE | Admit: 2015-06-04 | Discharge: 2015-06-04 | Disposition: A | Discharge status: Home / Self Care | Payer: Medicaid Other | Source: Ambulatory Visit | Attending: Hematology and Oncology | Admitting: Hematology and Oncology

## 2015-06-04 VITALS — BP 100/59 | HR 70 | Temp 98.6°F

## 2015-06-04 DIAGNOSIS — C8190 Hodgkin lymphoma, unspecified, unspecified site: Secondary | ICD-10-CM

## 2015-06-04 DIAGNOSIS — C8590 Non-Hodgkin lymphoma, unspecified, unspecified site: Secondary | ICD-10-CM | POA: Diagnosis present

## 2015-06-04 DIAGNOSIS — Z79899 Other long term (current) drug therapy: Secondary | ICD-10-CM | POA: Insufficient documentation

## 2015-06-04 DIAGNOSIS — B20 Human immunodeficiency virus [HIV] disease: Secondary | ICD-10-CM | POA: Diagnosis not present

## 2015-06-04 DIAGNOSIS — Z95828 Presence of other vascular implants and grafts: Secondary | ICD-10-CM

## 2015-06-04 DIAGNOSIS — Z5111 Encounter for antineoplastic chemotherapy: Secondary | ICD-10-CM | POA: Diagnosis present

## 2015-06-04 DIAGNOSIS — C8174 Other classical Hodgkin lymphoma, lymph nodes of axilla and upper limb: Secondary | ICD-10-CM | POA: Diagnosis not present

## 2015-06-04 LAB — PREGNANCY, URINE: Preg Test, Ur: NEGATIVE

## 2015-06-04 LAB — CBC WITH DIFFERENTIAL/PLATELET
BASO%: 1 % (ref 0.0–2.0)
BASOS ABS: 0 10*3/uL (ref 0.0–0.1)
EOS%: 2.3 % (ref 0.0–7.0)
Eosinophils Absolute: 0 10*3/uL (ref 0.0–0.5)
HEMATOCRIT: 38.3 % (ref 34.8–46.6)
HEMOGLOBIN: 12.8 g/dL (ref 11.6–15.9)
LYMPH#: 1.2 10*3/uL (ref 0.9–3.3)
LYMPH%: 55.9 % — ABNORMAL HIGH (ref 14.0–49.7)
MCH: 31.6 pg (ref 25.1–34.0)
MCHC: 33.3 g/dL (ref 31.5–36.0)
MCV: 94.7 fL (ref 79.5–101.0)
MONO#: 0.6 10*3/uL (ref 0.1–0.9)
MONO%: 28.5 % — ABNORMAL HIGH (ref 0.0–14.0)
NEUT#: 0.3 10*3/uL — CL (ref 1.5–6.5)
NEUT%: 12.3 % — ABNORMAL LOW (ref 38.4–76.8)
PLATELETS: 272 10*3/uL (ref 145–400)
RBC: 4.04 10*6/uL (ref 3.70–5.45)
RDW: 15.9 % — AB (ref 11.2–14.5)
WBC: 2.2 10*3/uL — ABNORMAL LOW (ref 3.9–10.3)

## 2015-06-04 LAB — COMPREHENSIVE METABOLIC PANEL
ALBUMIN: 3.6 g/dL (ref 3.5–5.0)
ALK PHOS: 66 U/L (ref 40–150)
ALT: 21 U/L (ref 0–55)
ANION GAP: 12 meq/L — AB (ref 3–11)
AST: 15 U/L (ref 5–34)
BUN: 5.4 mg/dL — AB (ref 7.0–26.0)
CALCIUM: 9 mg/dL (ref 8.4–10.4)
CO2: 24 mEq/L (ref 22–29)
CREATININE: 0.8 mg/dL (ref 0.6–1.1)
Chloride: 105 mEq/L (ref 98–109)
EGFR: >90 mL/min/{1.73_m2} (ref >90)
Glucose: 122 mg/dl (ref 70–140)
POTASSIUM: 3.2 meq/L — AB (ref 3.5–5.1)
Sodium: 141 mEq/L (ref 136–145)
Total Bilirubin: 0.51 mg/dL (ref 0.20–1.20)
Total Protein: 6.5 g/dL (ref 6.4–8.3)

## 2015-06-04 LAB — LACTATE DEHYDROGENASE: LDH: 150 U/L (ref 125–245)

## 2015-06-04 MED ORDER — SODIUM CHLORIDE 0.9 % IV SOLN
8.0000 [IU]/m2 | Freq: Once | INTRAVENOUS | Status: AC
  Administered 2015-06-04: 12 [IU] via INTRAVENOUS
  Filled 2015-06-04: qty 4

## 2015-06-04 MED ORDER — SODIUM CHLORIDE 0.9% FLUSH
10.0000 mL | INTRAVENOUS | Status: DC | PRN
  Administered 2015-06-04: 10 mL via INTRAVENOUS
  Filled 2015-06-04: qty 10

## 2015-06-04 MED ORDER — DOXORUBICIN HCL CHEMO IV INJECTION 2 MG/ML
20.0000 mg/m2 | Freq: Once | INTRAVENOUS | Status: AC
  Administered 2015-06-04: 32 mg via INTRAVENOUS
  Filled 2015-06-04: qty 16

## 2015-06-04 MED ORDER — SODIUM CHLORIDE 0.9 % IV SOLN
300.0000 mg/m2 | Freq: Once | INTRAVENOUS | Status: AC
  Administered 2015-06-04: 460 mg via INTRAVENOUS
  Filled 2015-06-04: qty 23

## 2015-06-04 MED ORDER — HEPARIN SOD (PORK) LOCK FLUSH 100 UNIT/ML IV SOLN
500.0000 [IU] | Freq: Once | INTRAVENOUS | Status: AC | PRN
  Administered 2015-06-04: 500 [IU]
  Filled 2015-06-04: qty 5

## 2015-06-04 MED ORDER — SODIUM CHLORIDE 0.9 % IV SOLN
Freq: Once | INTRAVENOUS | Status: AC
  Administered 2015-06-04: 10:00:00 via INTRAVENOUS
  Filled 2015-06-04: qty 8

## 2015-06-04 MED ORDER — SODIUM CHLORIDE 0.9 % IJ SOLN
10.0000 mL | INTRAMUSCULAR | Status: DC | PRN
  Administered 2015-06-04: 10 mL
  Filled 2015-06-04: qty 10

## 2015-06-04 MED ORDER — VINBLASTINE SULFATE CHEMO INJECTION 1 MG/ML
4.5000 mg/m2 | Freq: Once | INTRAVENOUS | Status: AC
  Administered 2015-06-04: 7 mg via INTRAVENOUS
  Filled 2015-06-04: qty 7

## 2015-06-04 MED ORDER — SODIUM CHLORIDE 0.9 % IV SOLN
Freq: Once | INTRAVENOUS | Status: AC
  Administered 2015-06-04: 10:00:00 via INTRAVENOUS

## 2015-06-04 NOTE — Patient Instructions (Signed)
[place] Discharge Instructions for Patients Receiving Chemotherapy  Today you received the following chemotherapy agents adriamycin/bleomycin/vinblastine/DTIC  To help prevent nausea and vomiting after your treatment, we encourage you to take your nausea medication as directed   If you develop nausea and vomiting that is not controlled by your nausea medication, call the clinic.   BELOW ARE SYMPTOMS THAT SHOULD BE REPORTED IMMEDIATELY:  *FEVER GREATER THAN 100.5 F  *CHILLS WITH OR WITHOUT FEVER  NAUSEA AND VOMITING THAT IS NOT CONTROLLED WITH YOUR NAUSEA MEDICATION  *UNUSUAL SHORTNESS OF BREATH  *UNUSUAL BRUISING OR BLEEDING  TENDERNESS IN MOUTH AND THROAT WITH OR WITHOUT PRESENCE OF ULCERS  *URINARY PROBLEMS  *BOWEL PROBLEMS  UNUSUAL RASH Items with * indicate a potential emergency and should be followed up as soon as possible.  Feel free to call the clinic you have any questions or concerns. The clinic phone number is [phone].

## 2015-06-04 NOTE — Patient Instructions (Signed)

## 2015-06-18 ENCOUNTER — Other Ambulatory Visit (HOSPITAL_COMMUNITY)
Admission: RE | Admit: 2015-06-18 | Discharge: 2015-06-18 | Disposition: A | Discharge status: Home / Self Care | Payer: Medicaid Other | Source: Ambulatory Visit | Attending: Hematology and Oncology | Admitting: Hematology and Oncology

## 2015-06-18 ENCOUNTER — Telehealth: Payer: Self-pay | Admitting: Hematology and Oncology

## 2015-06-18 ENCOUNTER — Other Ambulatory Visit (HOSPITAL_BASED_OUTPATIENT_CLINIC_OR_DEPARTMENT_OTHER): Payer: Medicaid Other

## 2015-06-18 ENCOUNTER — Ambulatory Visit (HOSPITAL_BASED_OUTPATIENT_CLINIC_OR_DEPARTMENT_OTHER): Payer: Medicaid Other | Admitting: Hematology and Oncology

## 2015-06-18 ENCOUNTER — Ambulatory Visit (HOSPITAL_BASED_OUTPATIENT_CLINIC_OR_DEPARTMENT_OTHER): Payer: Medicaid Other

## 2015-06-18 ENCOUNTER — Ambulatory Visit: Payer: Medicaid Other

## 2015-06-18 ENCOUNTER — Encounter: Payer: Self-pay | Admitting: Hematology and Oncology

## 2015-06-18 VITALS — BP 99/63 | HR 68 | Temp 98.3°F | Resp 18 | Ht 64.0 in | Wt 109.3 lb

## 2015-06-18 DIAGNOSIS — Z95828 Presence of other vascular implants and grafts: Secondary | ICD-10-CM

## 2015-06-18 DIAGNOSIS — C8170 Other classical Hodgkin lymphoma, unspecified site: Secondary | ICD-10-CM

## 2015-06-18 DIAGNOSIS — D701 Agranulocytosis secondary to cancer chemotherapy: Secondary | ICD-10-CM

## 2015-06-18 DIAGNOSIS — C8174 Other classical Hodgkin lymphoma, lymph nodes of axilla and upper limb: Secondary | ICD-10-CM

## 2015-06-18 DIAGNOSIS — B20 Human immunodeficiency virus [HIV] disease: Secondary | ICD-10-CM | POA: Diagnosis not present

## 2015-06-18 DIAGNOSIS — Z5111 Encounter for antineoplastic chemotherapy: Secondary | ICD-10-CM

## 2015-06-18 DIAGNOSIS — Z720 Tobacco use: Secondary | ICD-10-CM

## 2015-06-18 DIAGNOSIS — D702 Other drug-induced agranulocytosis: Secondary | ICD-10-CM

## 2015-06-18 DIAGNOSIS — R591 Generalized enlarged lymph nodes: Secondary | ICD-10-CM | POA: Insufficient documentation

## 2015-06-18 DIAGNOSIS — C8190 Hodgkin lymphoma, unspecified, unspecified site: Secondary | ICD-10-CM

## 2015-06-18 LAB — CBC WITH DIFFERENTIAL/PLATELET
BASO%: 1.3 % (ref 0.0–2.0)
BASOS ABS: 0 10*3/uL (ref 0.0–0.1)
EOS%: 2.7 % (ref 0.0–7.0)
Eosinophils Absolute: 0.1 10*3/uL (ref 0.0–0.5)
HEMATOCRIT: 39 % (ref 34.8–46.6)
HGB: 13 g/dL (ref 11.6–15.9)
LYMPH#: 1.5 10*3/uL (ref 0.9–3.3)
LYMPH%: 45.3 % (ref 14.0–49.7)
MCH: 31.4 pg (ref 25.1–34.0)
MCHC: 33.4 g/dL (ref 31.5–36.0)
MCV: 94 fL (ref 79.5–101.0)
MONO#: 0.9 10*3/uL (ref 0.1–0.9)
MONO%: 27.2 % — ABNORMAL HIGH (ref 0.0–14.0)
NEUT#: 0.8 10*3/uL — ABNORMAL LOW (ref 1.5–6.5)
NEUT%: 23.5 % — AB (ref 38.4–76.8)
PLATELETS: 273 10*3/uL (ref 145–400)
RBC: 4.15 10*6/uL (ref 3.70–5.45)
RDW: 16.8 % — ABNORMAL HIGH (ref 11.2–14.5)
WBC: 3.2 10*3/uL — ABNORMAL LOW (ref 3.9–10.3)

## 2015-06-18 LAB — COMPREHENSIVE METABOLIC PANEL
ALT: 16 U/L (ref 0–55)
AST: 15 U/L (ref 5–34)
Albumin: 3.7 g/dL (ref 3.5–5.0)
Alkaline Phosphatase: 63 U/L (ref 40–150)
Anion Gap: 7 mEq/L (ref 3–11)
BUN: 10 mg/dL (ref 7.0–26.0)
CALCIUM: 9.2 mg/dL (ref 8.4–10.4)
CHLORIDE: 107 meq/L (ref 98–109)
CO2: 26 meq/L (ref 22–29)
Creatinine: 0.7 mg/dL (ref 0.6–1.1)
EGFR: >90 mL/min/{1.73_m2} (ref >90)
Glucose: 77 mg/dl (ref 70–140)
POTASSIUM: 4.2 meq/L (ref 3.5–5.1)
Sodium: 140 mEq/L (ref 136–145)
Total Bilirubin: 0.85 mg/dL (ref 0.20–1.20)
Total Protein: 6.8 g/dL (ref 6.4–8.3)

## 2015-06-18 LAB — PREGNANCY, URINE: PREG TEST UR: NEGATIVE

## 2015-06-18 LAB — LACTATE DEHYDROGENASE: LDH: 168 U/L (ref 125–245)

## 2015-06-18 MED ORDER — HEPARIN SOD (PORK) LOCK FLUSH 100 UNIT/ML IV SOLN
500.0000 [IU] | Freq: Once | INTRAVENOUS | Status: AC | PRN
  Administered 2015-06-18: 500 [IU]
  Filled 2015-06-18: qty 5

## 2015-06-18 MED ORDER — BLEOMYCIN SULFATE CHEMO INJECTION 30 UNIT
8.0000 [IU]/m2 | Freq: Once | INTRAMUSCULAR | Status: AC
  Administered 2015-06-18: 12 [IU] via INTRAVENOUS
  Filled 2015-06-18: qty 4

## 2015-06-18 MED ORDER — SODIUM CHLORIDE 0.9% FLUSH
10.0000 mL | INTRAVENOUS | Status: DC | PRN
  Administered 2015-06-18: 10 mL via INTRAVENOUS
  Filled 2015-06-18: qty 10

## 2015-06-18 MED ORDER — SODIUM CHLORIDE 0.9 % IV SOLN
Freq: Once | INTRAVENOUS | Status: AC
  Administered 2015-06-18: 10:00:00 via INTRAVENOUS
  Filled 2015-06-18: qty 8

## 2015-06-18 MED ORDER — SODIUM CHLORIDE 0.9 % IJ SOLN
10.0000 mL | INTRAMUSCULAR | Status: DC | PRN
  Administered 2015-06-18: 10 mL
  Filled 2015-06-18: qty 10

## 2015-06-18 MED ORDER — DOXORUBICIN HCL CHEMO IV INJECTION 2 MG/ML
20.0000 mg/m2 | Freq: Once | INTRAVENOUS | Status: AC
  Administered 2015-06-18: 32 mg via INTRAVENOUS
  Filled 2015-06-18: qty 16

## 2015-06-18 MED ORDER — SODIUM CHLORIDE 0.9 % IV SOLN
Freq: Once | INTRAVENOUS | Status: AC
  Administered 2015-06-18: 10:00:00 via INTRAVENOUS

## 2015-06-18 MED ORDER — VINBLASTINE SULFATE CHEMO INJECTION 1 MG/ML
4.5000 mg/m2 | Freq: Once | INTRAVENOUS | Status: AC
  Administered 2015-06-18: 7 mg via INTRAVENOUS
  Filled 2015-06-18: qty 7

## 2015-06-18 MED ORDER — LEUPROLIDE ACETATE 7.5 MG IM KIT
7.5000 mg | PACK | INTRAMUSCULAR | Status: DC
Start: 2015-06-18 — End: 2015-06-18
  Administered 2015-06-18: 7.5 mg via INTRAMUSCULAR
  Filled 2015-06-18: qty 7.5

## 2015-06-18 MED ORDER — SODIUM CHLORIDE 0.9 % IV SOLN
300.0000 mg/m2 | Freq: Once | INTRAVENOUS | Status: AC
  Administered 2015-06-18: 460 mg via INTRAVENOUS
  Filled 2015-06-18: qty 23

## 2015-06-18 NOTE — Telephone Encounter (Signed)
per pof to sch pt appt-gave pt copy of avs °

## 2015-06-18 NOTE — Assessment & Plan Note (Signed)
Originally, I increased her back to full doses due to improve mild liver function tests. Unfortunately, she developed infectious complication and diarrhea. Subsequently, I reduced all her chemotherapy by 20% due to recent side effects. Clinically, she appears to be responding to treatment with resolution of palpable lymphadenopathy on her left neck & positive response on PET scan I review her PET scan at the most recent hematology tumor board. She have one indeterminate lesion on her back but she is not symptomatic. She has near complete resolution of all disease. I recommend we proceed with treatment and then finish off with another imaging study, planned for March 2017

## 2015-06-18 NOTE — Assessment & Plan Note (Signed)
This is likely due to recent treatment. The patient denies recent history of fevers, cough, chills, diarrhea or dysuria. She is asymptomatic from the leukopenia. I will observe for now.  I will continue the chemotherapy at current dose without dosage adjustment.  If the leukopenia gets progressive worse in the future, I might have to delay her treatment or adjust the chemotherapy dose.   

## 2015-06-18 NOTE — Progress Notes (Signed)
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   05/07/2014 Imaging CT scan of the chest show right thoracic inlet lymphadenopathy   06/07/2014 Procedure She underwent ultrasound-guided core biopsy of the neck lymph node   06/07/2014 Pathology Results Accession: [id number] biopsy confirmed diagnosis of Hodgkin lymphoma.   06/15/2014 Imaging Echocardiogram showed preserved ejection fraction   07/09/2014 - 07/12/2014 Hospital Admission She was admitted to the hospital for severe anemia.   07/27/2014 Procedure She had placement of port   07/31/2014 - 09/11/2014 Chemotherapy She received dose adjusted chemotherapy due to abnormal liver function tests and severe anemia. Treatment was delayed due to noncompliance  and subsequently stopped because the patient failed to keep appointments   01/11/2015 Imaging Repeat PET CT scan showed response to treatment   01/28/2015 -  Chemotherapy ABVD was restarted with full dose.   02/08/2015 - 02/10/2015 Hospital Admission The patient was admitted to the hospital due to pancytopenia and profuse diarrhea. Cultures were negative. She was placed on ciprofloxacin.   02/11/2015 Adverse Reaction Treatment was placed on hold due to recent infection.   04/11/2015 Imaging PET CT scan showed near complete response. Incidental finding of an abnormal bone lesion, indeterminate. She is not symptomatic. Recommendation from Hem TB to observe    INTERVAL HISTORY: Please see below for problem oriented charting. She returns today for her final treatment She feels well. Denies recent  infection. No neuropathy. Denies any cough or chest pain  REVIEW OF SYSTEMS:   Constitutional: Denies fevers, chills or abnormal weight loss Eyes: Denies blurriness of vision Ears, nose, mouth, throat, and face: Denies mucositis or sore throat Respiratory: Denies cough, dyspnea or wheezes Cardiovascular: Denies palpitation, chest discomfort or lower extremity swelling Gastrointestinal:  Denies nausea, heartburn or change in bowel habits Skin: Denies abnormal skin rashes Lymphatics: Denies new lymphadenopathy or easy bruising Neurological:Denies numbness, tingling or new weaknesses Behavioral/Psych: Mood is stable, no new changes  All other systems were reviewed with the patient and are negative.  I have reviewed the past medical history, past surgical history, social history and family history with the patient and they are unchanged from previous note.  ALLERGIES:  has No Known Allergies.  MEDICATIONS:  Current Outpatient Prescriptions  Medication Sig Dispense Refill  . atazanavir (REYATAZ) 300 MG capsule TAKE ONE CAPSULE BY MOUTH DAILY WITH BREAKFAST. TAKE WITH NORVIR 30 capsule 1  . buPROPion (WELLBUTRIN XL) 150 MG 24 hr tablet Take 150 mg by mouth at bedtime.     . diphenhydrAMINE (BENADRYL) 25 mg capsule Take 1 capsule (25 mg total) by mouth every 6 (six) hours as needed. 30 capsule 0  . emtricitabine-tenofovir (TRUVADA) 200-300 MG tablet Take 1 tablet by mouth daily. 30 tablet 1  . HYDROcodone-acetaminophen (NORCO) 5-325 MG tablet Take 1 tablet by mouth every 6 (six) hours as needed. 20 tablet 0  . hydrocortisone cream 0.5 % Apply 1 application topically 2 (two) times daily. 30 g 1  . ketoconazole (NIZORAL) 2 % cream Apply 1 application topically 2 (two) times daily as needed for irritation. 60 g 3  . lidocaine-prilocaine (EMLA) cream Apply to affected area  once 30 g 3  . mupirocin cream (BACTROBAN) 2 % Apply 1 application topically 3 (three) times daily. For 5 days. 15 g 0  .  Naphazoline HCl (CLEAR EYES OP) Apply 2 drops to eye 3 (three) times daily as needed (redness).     . predniSONE (DELTASONE) 20 MG tablet Take 2 tablets (40 mg total) by mouth daily. 8 tablet 0  . ritonavir (NORVIR) 100 MG TABS tablet Take 1 tablet (100 mg total) by mouth daily with breakfast. TAKE WITH REYATAZ 30 tablet 1  . traZODone (DESYREL) 50 MG tablet Take 50 mg by mouth at bedtime.    Marland Kitchen ibuprofen (ADVIL,MOTRIN) 200 MG tablet Take 2 tablets (400 mg total) by mouth every 6 (six) hours as needed for moderate pain. (Patient not taking: Reported on 05/21/2015) 30 tablet 0  . ondansetron (ZOFRAN ODT) 8 MG disintegrating tablet Take 1 tablet (8 mg total) by mouth every 8 (eight) hours as needed for nausea or vomiting. (Patient not taking: Reported on 05/21/2015) 15 tablet 0  . ondansetron (ZOFRAN) 4 MG tablet Take 1 tablet (4 mg total) by mouth every 6 (six) hours. (Patient not taking: Reported on 05/21/2015) 15 tablet 0  . potassium chloride SA (K-DUR,KLOR-CON) 20 MEQ tablet Take 1 tablet (20 mEq total) by mouth 2 (two) times daily. (Patient not taking: Reported on 04/23/2015) 60 tablet 0   No current facility-administered medications for this visit.   Facility-Administered Medications Ordered in Other Visits  Medication Dose Route Frequency Provider Last Rate Last Dose  . bleomycin (BLEOCIN) 12 Units in sodium chloride 0.9 % 50 mL chemo infusion  8 Units/m2 (Treatment Plan Actual) Intravenous Once Heath Lark, MD      . dacarbazine (DTIC) 460 mg in sodium chloride 0.9 % 250 mL chemo infusion  300 mg/m2 (Treatment Plan Actual) Intravenous Once Heath Lark, MD      . DOXOrubicin (ADRIAMYCIN) chemo injection 32 mg  20 mg/m2 (Treatment Plan Actual) Intravenous Once Heath Lark, MD      . heparin lock flush 100 unit/mL  500 Units Intracatheter Once PRN Heath Lark, MD      . leuprolide (LUPRON) injection 7.5 mg  7.5 mg Intramuscular Q28 days Heath Lark, MD      . ondansetron (ZOFRAN) 16 mg, dexamethasone  (DECADRON) 20 mg in sodium chloride 0.9 % 50 mL IVPB   Intravenous Once Heath Lark, MD      . sodium chloride 0.9 % injection 10 mL  10 mL Intracatheter PRN Heath Lark, MD      . vinBLAStine (VELBAN) 7 mg in sodium chloride 0.9 % 50 mL chemo infusion  4.5 mg/m2 (Treatment Plan Actual) Intravenous Once Heath Lark, MD        PHYSICAL EXAMINATION: ECOG PERFORMANCE STATUS: 0 - Asymptomatic  Filed Vitals:   06/18/15 0840  BP: 99/63  Pulse: 68  Temp: 98.3 F (36.8 C)  Resp: 18   Filed Weights   06/18/15 0840  Weight: 109 lb 4.8 oz (49.578 kg)    GENERAL:alert, no distress and comfortable SKIN: skin color, texture, turgor are normal, no rashes or significant lesions EYES: normal, Conjunctiva are pink and non-injected, sclera clear OROPHARYNX:no exudate, no erythema and lips, buccal mucosa, and tongue normal  NECK: supple, thyroid normal size, non-tender, without nodularity LYMPH:  no palpable lymphadenopathy in the cervical, axillary or inguinal LUNGS: clear to auscultation and percussion with normal breathing effort HEART: regular rate & rhythm and no murmurs and no lower extremity edema ABDOMEN:abdomen soft,  non-tender and normal bowel sounds Musculoskeletal:no cyanosis of digits and no clubbing  NEURO: alert & oriented x 3 with fluent speech, no focal motor/sensory deficits  LABORATORY DATA:  I have reviewed the data as listed    Component Value Date/Time   NA 140 06/18/2015 0805   NA 139 02/10/2015 1202   K 4.2 06/18/2015 0805   K 3.4* 02/10/2015 1202   CL 108 02/10/2015 1202   CO2 26 06/18/2015 0805   CO2 26 02/10/2015 1202   GLUCOSE 77 06/18/2015 0805   GLUCOSE 81 02/10/2015 1202   BUN 10.0 06/18/2015 0805   BUN <5* 02/10/2015 1202   CREATININE 0.7 06/18/2015 0805   CREATININE 0.77 02/10/2015 1202   CREATININE 0.66 12/04/2014 1014   CALCIUM 9.2 06/18/2015 0805   CALCIUM 8.4* 02/10/2015 1202   PROT 6.8 06/18/2015 0805   PROT 7.1 02/08/2015 1158   ALBUMIN 3.7  06/18/2015 0805   ALBUMIN 3.9 02/08/2015 1158   AST 15 06/18/2015 0805   AST 25 02/08/2015 1158   ALT 16 06/18/2015 0805   ALT 22 02/08/2015 1158   ALKPHOS 63 06/18/2015 0805   ALKPHOS 66 02/08/2015 1158   BILITOT 0.85 06/18/2015 0805   BILITOT 0.5 02/08/2015 1158   GFRNONAA >60 02/10/2015 1202   GFRNONAA >89 12/04/2014 1014   GFRAA >60 02/10/2015 1202   GFRAA >89 12/04/2014 1014    No results found for: SPEP, UPEP  Lab Results  Component Value Date   WBC 3.2* 06/18/2015   NEUTROABS 0.8* 06/18/2015   HGB 13.0 06/18/2015   HCT 39.0 06/18/2015   MCV 94.0 06/18/2015   PLT 273 06/18/2015      Chemistry      Component Value Date/Time   NA 140 06/18/2015 0805   NA 139 02/10/2015 1202   K 4.2 06/18/2015 0805   K 3.4* 02/10/2015 1202   CL 108 02/10/2015 1202   CO2 26 06/18/2015 0805   CO2 26 02/10/2015 1202   BUN 10.0 06/18/2015 0805   BUN <5* 02/10/2015 1202   CREATININE 0.7 06/18/2015 0805   CREATININE 0.77 02/10/2015 1202   CREATININE 0.66 12/04/2014 1014      Component Value Date/Time   CALCIUM 9.2 06/18/2015 0805   CALCIUM 8.4* 02/10/2015 1202   ALKPHOS 63 06/18/2015 0805   ALKPHOS 66 02/08/2015 1158   AST 15 06/18/2015 0805   AST 25 02/08/2015 1158   ALT 16 06/18/2015 0805   ALT 22 02/08/2015 1158   BILITOT 0.85 06/18/2015 0805   BILITOT 0.5 02/08/2015 1158      ASSESSMENT & PLAN:  Other classical hodgkin lymphoma, 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 Unfortunately, she developed infectious complication and diarrhea. Subsequently, I reduced all her chemotherapy by 20% due to recent side effects. Clinically, she appears to be responding to treatment with resolution of palpable lymphadenopathy on her left neck & positive response on PET scan I review her PET scan at the most recent hematology tumor board. She have one indeterminate lesion on her back but she is not  symptomatic. She has near complete resolution of all disease. I recommend we proceed with treatment and then finish off with another imaging study, planned for March 2017  Drug-induced neutropenia [facility] This is likely due to recent treatment. The patient denies recent history of fevers, cough, chills, diarrhea or dysuria. She is asymptomatic from the leukopenia. I will observe for now.  I will continue the chemotherapy at current dose without dosage adjustment.  If the leukopenia gets progressive worse in the future, I might have to delay her treatment or adjust the chemotherapy dose.   Human immunodeficiency virus (HIV) disease ([place]) She continues taking anti-retroviral treatment. Again, I reinforced the importance of her keeping her appointments with infectious disease.   Tobacco abuse I spent some time counseling the patient the importance of tobacco cessation. she is currently attempting to quit on her own       Orders Placed This Encounter  Procedures  . NM PET Image Restag (PS) Skull Base To Thigh    Standing Status: Future     Number of Occurrences:      Standing Expiration Date: 08/17/2016    Order Specific Question:  Reason for Exam (SYMPTOM  OR DIAGNOSIS REQUIRED)    Answer:  lymphoma, staging, exclude disease  progression    Order Specific Question:  Is the patient pregnant?    Answer:  No    Order Specific Question:  Preferred imaging location?    Answer:  [facility]   All questions were answered. The patient knows to call the clinic with any problems, questions or concerns. No barriers to learning was detected. I spent 20 minutes counseling the patient face to face. The total time spent in the appointment was 25 minutes and more than 50% was on counseling and review of test results     [facility], [place], MD 06/18/2015 9:55 AM

## 2015-06-18 NOTE — Patient Instructions (Signed)

## 2015-06-18 NOTE — Assessment & Plan Note (Signed)
I spent some time counseling the patient the importance of tobacco cessation. she is currently attempting to quit on her own 

## 2015-06-18 NOTE — Patient Instructions (Signed)
[facility] Discharge Instructions for Patients Receiving Chemotherapy  Today you received the following chemotherapy agents adriamycin/bleomycin/vinblastine/DTIC  To help prevent nausea and vomiting after your treatment, we encourage you to take your nausea medication as directed   If you develop nausea and vomiting that is not controlled by your nausea medication, call the clinic.   BELOW ARE SYMPTOMS THAT SHOULD BE REPORTED IMMEDIATELY:  *FEVER GREATER THAN 100.5 F  *CHILLS WITH OR WITHOUT FEVER  NAUSEA AND VOMITING THAT IS NOT CONTROLLED WITH YOUR NAUSEA MEDICATION  *UNUSUAL SHORTNESS OF BREATH  *UNUSUAL BRUISING OR BLEEDING  TENDERNESS IN MOUTH AND THROAT WITH OR WITHOUT PRESENCE OF ULCERS  *URINARY PROBLEMS  *BOWEL PROBLEMS  UNUSUAL RASH Items with * indicate a potential emergency and should be followed up as soon as possible.  Feel free to call the clinic you have any questions or concerns. The clinic phone number is [phone].

## 2015-06-18 NOTE — Assessment & Plan Note (Signed)
She continues taking anti-retroviral treatment. Again, I reinforced the importance of her keeping her appointments with infectious disease.

## 2015-06-20 ENCOUNTER — Other Ambulatory Visit: Payer: Self-pay | Admitting: Hematology and Oncology

## 2015-06-26 ENCOUNTER — Telehealth: Payer: Self-pay | Admitting: Hematology and Oncology

## 2015-06-26 ENCOUNTER — Telehealth: Payer: Self-pay | Admitting: *Deleted

## 2015-06-26 NOTE — Telephone Encounter (Signed)
Pt reports right arm pain aching constantly since yesterday.  Pt crying on phone.  States pain is bad. Denies any swelling.  Notified Dr. Alvy Bimler and she instructs pt go to ED if pain is too bad or she can take ibuprofen see her in office tomorrow morning at 8:30 am.   Informed pt and she will come into see Dr. Alvy Bimler in morning at 8:30 am.   Instructed pt to try Ibuprofen but go to ED if pain gets too bad before tomorrow morning.  She verbalized understanding.

## 2015-06-26 NOTE — Telephone Encounter (Signed)
"  My port-a-cath started aching yesterday.  It hurts from right chest all the way up to neck.  The entire right side hurts.  No injury pulling, pushing, lifting, redness, swelling, drainage.  Occassionally I pick up my three year niece.  No problem with port-a-cath after chemotherapy (ABVD on 06-18-2015)." Port-a-cath inserted 07-27-2014 by IR.  Call transferred to collaborative.

## 2015-06-26 NOTE — Telephone Encounter (Signed)
appt made per 3/1 pof. Pt aware °

## 2015-06-27 ENCOUNTER — Encounter (HOSPITAL_COMMUNITY): Payer: Self-pay

## 2015-06-27 ENCOUNTER — Emergency Department (HOSPITAL_COMMUNITY)
Admission: EM | Admit: 2015-06-27 | Discharge: 2015-06-27 | Disposition: A | Discharge status: Home / Self Care | Payer: Medicaid Other | Attending: Emergency Medicine | Admitting: Emergency Medicine

## 2015-06-27 ENCOUNTER — Ambulatory Visit (HOSPITAL_BASED_OUTPATIENT_CLINIC_OR_DEPARTMENT_OTHER): Payer: Medicaid Other | Admitting: Hematology and Oncology

## 2015-06-27 ENCOUNTER — Encounter: Payer: Self-pay | Admitting: Hematology and Oncology

## 2015-06-27 VITALS — BP 152/104 | HR 94 | Temp 98.0°F | Resp 19 | Wt 103.7 lb

## 2015-06-27 DIAGNOSIS — M79641 Pain in right hand: Secondary | ICD-10-CM | POA: Insufficient documentation

## 2015-06-27 DIAGNOSIS — F329 Major depressive disorder, single episode, unspecified: Secondary | ICD-10-CM | POA: Insufficient documentation

## 2015-06-27 DIAGNOSIS — Z8719 Personal history of other diseases of the digestive system: Secondary | ICD-10-CM | POA: Diagnosis not present

## 2015-06-27 DIAGNOSIS — Z7952 Long term (current) use of systemic steroids: Secondary | ICD-10-CM | POA: Insufficient documentation

## 2015-06-27 DIAGNOSIS — Z79899 Other long term (current) drug therapy: Secondary | ICD-10-CM | POA: Diagnosis not present

## 2015-06-27 DIAGNOSIS — B20 Human immunodeficiency virus [HIV] disease: Secondary | ICD-10-CM | POA: Diagnosis not present

## 2015-06-27 DIAGNOSIS — Z8571 Personal history of Hodgkin lymphoma: Secondary | ICD-10-CM | POA: Diagnosis not present

## 2015-06-27 DIAGNOSIS — R234 Changes in skin texture: Secondary | ICD-10-CM | POA: Insufficient documentation

## 2015-06-27 DIAGNOSIS — E876 Hypokalemia: Secondary | ICD-10-CM | POA: Diagnosis not present

## 2015-06-27 DIAGNOSIS — Z8709 Personal history of other diseases of the respiratory system: Secondary | ICD-10-CM | POA: Diagnosis not present

## 2015-06-27 DIAGNOSIS — Z862 Personal history of diseases of the blood and blood-forming organs and certain disorders involving the immune mechanism: Secondary | ICD-10-CM | POA: Insufficient documentation

## 2015-06-27 DIAGNOSIS — Z8619 Personal history of other infectious and parasitic diseases: Secondary | ICD-10-CM | POA: Insufficient documentation

## 2015-06-27 DIAGNOSIS — C8174 Other classical Hodgkin lymphoma, lymph nodes of axilla and upper limb: Secondary | ICD-10-CM

## 2015-06-27 DIAGNOSIS — F17210 Nicotine dependence, cigarettes, uncomplicated: Secondary | ICD-10-CM | POA: Insufficient documentation

## 2015-06-27 DIAGNOSIS — R0789 Other chest pain: Secondary | ICD-10-CM

## 2015-06-27 DIAGNOSIS — M25512 Pain in left shoulder: Secondary | ICD-10-CM | POA: Diagnosis not present

## 2015-06-27 HISTORY — DX: Other chest pain: R07.89

## 2015-06-27 MED ORDER — CEPHALEXIN 500 MG PO CAPS: 500.0000 mg | ORAL_CAPSULE | Freq: Four times a day (QID) | ORAL | Status: DC

## 2015-06-27 MED ORDER — TRAMADOL HCL 50 MG PO TABS: 50.0000 mg | ORAL_TABLET | Freq: Four times a day (QID) | ORAL | Status: DC | PRN

## 2015-06-27 MED ORDER — BACITRACIN ZINC 500 UNIT/GM EX OINT
TOPICAL_OINTMENT | Freq: Two times a day (BID) | CUTANEOUS | Status: DC
  Administered 2015-06-27: 18:00:00 via TOPICAL
  Filled 2015-06-27: qty 0.9

## 2015-06-27 NOTE — ED Provider Notes (Signed)
CSN: [id number]     Arrival date & time 06/27/15  1536 History  By signing my name below, I, [name], attest that this documentation has been prepared under the direction and in the presence of Allison Stivers, PA-C Electronically Signed: Soijett Whitehead, ED Scribe. 06/27/2015. 5:40 PM.    Chief Complaint  Patient presents with  . Shoulder Pain  . Skin peeling        The history is provided by the patient. No language interpreter was used.    [name] is a 30 y.o. female with a medical hx of HIV and lymphoma, who presents to the Emergency Department complaining of right hand skin peeling onset 4 days. Pt reports that there was intermittent clear drainage from the area following her picking at the area. She notes that she has had this happen to her left hand in the past that healed on its own. Pt last and final chemo treatment was 06/18/2015. Pt receives chemo only without radiation. She notes that she has not tried any medications for the relief of her symptoms. Patient denies fever/chills, neuro deficits, swelling, or any other complaints. Denies allergies to medications.   Past Medical History  Diagnosis Date  . HIV (human immunodeficiency virus infection) ([place])   . Depression   . Anemia   . HSV (herpes simplex virus) infection   . History of shingles   . AIN III (anal intraepithelial neoplasia III)   . Condyloma acuminatum in female   . History of chronic bronchitis   . History of esophagitis     CANDIDA  . Periodontitis, chronic   . Hodgkin's lymphoma ([place]) 06/12/2014  . Cancer ([place])     Hodgkin lymphoma  . Hypokalemia 07/17/2014  . Screening examination for venereal disease 10/30/2013  . Chest wall pain 06/27/2015   Past Surgical History  Procedure Laterality Date  . Dilation and curettage of uterus  2005    MISSED AB  . Examination under anesthesia N/A 09/23/2012    Procedure: EXAM UNDER ANESTHESIA;  Surgeon: Adin Hector, MD;  Location: [facility];   Service: General;  Laterality: N/A;  . Laser ablation condolamata N/A 09/23/2012    Procedure: REMOVAL/ABLATION  ABLATION CONDOLAMATA WARTS;  Surgeon: Adin Hector, MD;  Location: [facility];  Service: General;  Laterality: N/A;   Family History  Problem Relation Age of Onset  . Cancer Maternal Aunt     unknown ca  . Cancer Maternal Grandmother     unknown ca   Social History  Substance Use Topics  . Smoking status: Current Every Day Smoker -- 0.00 packs/day for 7 years    Types: Cigars    Start date: 03/19/2014  . Smokeless tobacco: Never Used     Comment: she smokes 3 Black and Mild Cigars daily  . Alcohol Use: No   OB History    Gravida Para Term Preterm AB TAB SAB Ectopic Multiple Living   4 0 0  2  1 1   0     Review of Systems  Constitutional: Negative for fever and chills.  Musculoskeletal: Positive for myalgias (Right hand pain). Negative for joint swelling.  Skin: Positive for wound. Negative for color change and rash.       Right hand skin peeling      Allergies  Review of patient's allergies indicates no known allergies.  Home Medications   Prior to Admission medications   Medication Sig Start Date End Date Taking? Authorizing  Provider  atazanavir (REYATAZ) 300 MG capsule TAKE ONE CAPSULE BY MOUTH DAILY WITH BREAKFAST. TAKE WITH NORVIR 06/03/15   Thayer Headings, MD  buPROPion (WELLBUTRIN XL) 150 MG 24 hr tablet Take 150 mg by mouth at bedtime.     Historical Provider, MD  cephALEXin (KEFLEX) 500 MG capsule Take 1 capsule (500 mg total) by mouth 4 (four) times daily. 06/27/15   Nadra Hritz C Areesha Dehaven, PA-C  emtricitabine-tenofovir (TRUVADA) 200-300 MG tablet Take 1 tablet by mouth daily. 06/03/15   Thayer Headings, MD  hydrocortisone cream 0.5 % Apply 1 application topically 2 (two) times daily. 12/19/14   Thayer Headings, MD  ibuprofen (ADVIL,MOTRIN) 200 MG tablet Take 2 tablets (400 mg total) by mouth every 6 (six) hours as needed for moderate pain. 04/24/15    Heath Lark, MD  ketoconazole (NIZORAL) 2 % cream Apply 1 application topically 2 (two) times daily as needed for irritation. 12/25/14   Thayer Headings, MD  mupirocin cream (BACTROBAN) 2 % Apply 1 application topically 3 (three) times daily. For 5 days. 05/30/15   Gloriann Loan, PA-C  Naphazoline HCl (CLEAR EYES OP) Apply 2 drops to eye 3 (three) times daily as needed (redness).     Historical Provider, MD  ondansetron (ZOFRAN ODT) 8 MG disintegrating tablet Take 1 tablet (8 mg total) by mouth every 8 (eight) hours as needed for nausea or vomiting. 02/07/15   Tatyana Kirichenko, PA-C  ondansetron (ZOFRAN) 4 MG tablet Take 1 tablet (4 mg total) by mouth every 6 (six) hours. 01/10/15   Harvel Quale, MD  potassium chloride SA (K-DUR,KLOR-CON) 20 MEQ tablet Take 1 tablet (20 mEq total) by mouth 2 (two) times daily. 07/17/14   Heath Lark, MD  ritonavir (NORVIR) 100 MG TABS tablet Take 1 tablet (100 mg total) by mouth daily with breakfast. TAKE WITH REYATAZ 06/03/15   Thayer Headings, MD  traMADol (ULTRAM) 50 MG tablet Take 1 tablet (50 mg total) by mouth every 6 (six) hours as needed. 06/27/15   Heath Lark, MD  traZODone (DESYREL) 50 MG tablet Take 50 mg by mouth at bedtime.    Historical Provider, MD   BP 123/83 mmHg  Pulse 76  Temp(Src) 98.1 F (36.7 C) (Oral)  Resp 16  SpO2 100% Physical Exam  Constitutional: She appears well-developed and well-nourished. No distress.  HENT:  Head: Normocephalic and atraumatic.  Neck: Neck supple.  Cardiovascular: Normal rate.   Pulmonary/Chest: Effort normal. No respiratory distress.  Neurological: She is alert.  No sensory deficits. Strength 5 out of 5.  Skin: Skin is warm and dry.  Area of skin that has been worn away from the 1st and 2nd finger on right. Tender to touch in the immediate area, but no tenderness even a quarter inch away from the area. Resembles the area left after a blister pops. No discharge, erythema, or swelling. NVI. No discernible area of  fluctuance.  Psychiatric: She has a normal mood and affect. Her behavior is normal.  Nursing note and vitals reviewed.   ED Course  Procedures (including critical care time) DIAGNOSTIC STUDIES: Oxygen Saturation is 100% on RA, nl by my interpretation.    COORDINATION OF CARE: 5:38 PM Discussed treatment plan with pt at bedside which includes abx Rx and re-check in 2 days and pt agreed to plan.     MDM   Final diagnoses:  Pain of right hand    [name] presents with right hand pain for the last  4 days.  This area in between the first and second finger has the appearance of a ruptured blister. The patient's description of clear fluid leaking from it is consistent with this theory. No evidence of cellulitis or infection. No signs of systemic infection or abscess. Patient to return to the ED or go to a PCP office in 2 days for wound check. Patient placed on antibiotics as a precaution due to her recent cancer history and history of HIV. Strict return precautions discussed. Patient voiced understanding of these instructions and is comfortable with discharge.  Filed Vitals:   06/27/15 1614  BP: 123/83  Pulse: 76  Temp: 98.1 F (36.7 C)  TempSrc: Oral  Resp: 16  SpO2: 100%     I personally performed the services described in this documentation, which was scribed in my presence. The recorded information has been reviewed and is accurate.   Lorayne Bender, PA-C 06/27/15 1809  Orlie Dakin, MD 06/28/15 [phone]

## 2015-06-27 NOTE — Assessment & Plan Note (Signed)
She has significant chest wall pain related to placement of the port. She admitted she lifted her niece recently and might have strained the musculature around the port site. Examination is benign. I recommend we keep the port until her next PET/CT scan before we decide to take it out. In the meantime, I recommend conservative management with NSAID and tramadol as needed

## 2015-06-27 NOTE — Assessment & Plan Note (Signed)
She had completed recent treatment. Examination is benign. PET/CT scan is scheduled for the next 2 weeks. Continue supportive care

## 2015-06-27 NOTE — Progress Notes (Signed)
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   05/07/2014 Imaging CT scan of the chest show right thoracic inlet lymphadenopathy   06/07/2014 Procedure She underwent ultrasound-guided core biopsy of the neck lymph node   06/07/2014 Pathology Results Accession: [id number] biopsy confirmed diagnosis of Hodgkin lymphoma.   06/15/2014 Imaging Echocardiogram showed preserved ejection fraction   07/09/2014 - 07/12/2014 Hospital Admission She was admitted to the hospital for severe anemia.   07/27/2014 Procedure She had placement of port   07/31/2014 - 09/11/2014 Chemotherapy She received dose adjusted chemotherapy due to abnormal liver function tests and severe anemia. Treatment was delayed due to noncompliance  and subsequently stopped because the patient failed to keep appointments   01/11/2015 Imaging Repeat PET CT scan showed response to treatment   01/28/2015 - 06/18/2015 Chemotherapy ABVD was restarted with full dose.   02/08/2015 - 02/10/2015 Hospital Admission The patient was admitted to the hospital due to pancytopenia and profuse diarrhea. Cultures were negative. She was placed on ciprofloxacin.   02/11/2015 Adverse Reaction Treatment was placed on hold due to recent infection.   04/11/2015 Imaging PET CT scan showed near complete response. Incidental finding of an abnormal bone lesion, indeterminate. She is not symptomatic. Recommendation from [facility] to observe    INTERVAL HISTORY: Please see below for problem oriented charting. She complained about severe pain in the right side of the neck around  the port site recently. She is taking care of her 22-year-old niece and thought that she might have strained it. Yesterday, she rated her pain at 10 out of 10. After some ibuprofen, she described her pain more as an achiness.  REVIEW OF SYSTEMS:   Constitutional: Denies fevers, chills or abnormal weight loss Eyes: Denies blurriness of vision Ears, nose, mouth, throat, and face: Denies mucositis or sore throat Respiratory: Denies cough, dyspnea or wheezes Cardiovascular: Denies palpitation, chest discomfort or lower extremity swelling Gastrointestinal:  Denies nausea, heartburn or change in bowel habits Skin: Denies abnormal skin rashes Lymphatics: Denies new lymphadenopathy or easy bruising Neurological:Denies numbness, tingling or new weaknesses Behavioral/Psych: Mood is stable, no new changes  All other systems were reviewed with the patient and are negative.  I have reviewed the past medical history, past surgical history, social history and family history with the patient and they are unchanged from previous note.  ALLERGIES:  has No Known Allergies.  MEDICATIONS:  Current Outpatient Prescriptions  Medication Sig Dispense Refill  . atazanavir (REYATAZ) 300 MG capsule TAKE ONE CAPSULE BY MOUTH DAILY WITH BREAKFAST. TAKE WITH NORVIR 30 capsule 1  . buPROPion (WELLBUTRIN XL) 150 MG 24 hr tablet Take 150 mg by mouth at bedtime.     Marland Kitchen emtricitabine-tenofovir (TRUVADA) 200-300 MG tablet Take 1 tablet by mouth daily. 30 tablet 1  . hydrocortisone cream 0.5 % Apply 1 application topically 2 (two) times daily. 30 g 1  . ibuprofen (ADVIL,MOTRIN) 200 MG tablet Take 2 tablets (400 mg total) by mouth every 6 (six) hours as needed for moderate pain. 30 tablet 0  . ketoconazole (NIZORAL) 2 % cream Apply 1 application topically 2 (two) times daily as  needed for irritation. 60 g 3  . mupirocin cream (BACTROBAN) 2 % Apply 1 application topically 3 (three) times daily. For 5 days. 15 g 0  .  Naphazoline HCl (CLEAR EYES OP) Apply 2 drops to eye 3 (three) times daily as needed (redness).     . ondansetron (ZOFRAN ODT) 8 MG disintegrating tablet Take 1 tablet (8 mg total) by mouth every 8 (eight) hours as needed for nausea or vomiting. 15 tablet 0  . ondansetron (ZOFRAN) 4 MG tablet Take 1 tablet (4 mg total) by mouth every 6 (six) hours. 15 tablet 0  . potassium chloride SA (K-DUR,KLOR-CON) 20 MEQ tablet Take 1 tablet (20 mEq total) by mouth 2 (two) times daily. 60 tablet 0  . ritonavir (NORVIR) 100 MG TABS tablet Take 1 tablet (100 mg total) by mouth daily with breakfast. TAKE WITH REYATAZ 30 tablet 1  . traZODone (DESYREL) 50 MG tablet Take 50 mg by mouth at bedtime.    . traMADol (ULTRAM) 50 MG tablet Take 1 tablet (50 mg total) by mouth every 6 (six) hours as needed. 60 tablet 0   No current facility-administered medications for this visit.    PHYSICAL EXAMINATION: ECOG PERFORMANCE STATUS: 0 - Asymptomatic  Filed Vitals:   06/27/15 0834  BP: 152/104  Pulse: 94  Temp: 98 F (36.7 C)  Resp: 19   Filed Weights   06/27/15 0834  Weight: 103 lb 11.2 oz (47.038 kg)    GENERAL:alert, no distress and comfortable SKIN: skin color, texture, turgor are normal, no rashes or significant lesions EYES: normal, Conjunctiva are pink and non-injected, sclera clear OROPHARYNX:no exudate, no erythema and lips, buccal mucosa, and tongue normal  NECK: supple, thyroid normal size, non-tender, without nodularity LYMPH:  no palpable lymphadenopathy in the cervical, axillary or inguinal LUNGS: clear to auscultation and percussion with normal breathing effort HEART: regular rate & rhythm and no murmurs and no lower extremity edema ABDOMEN:abdomen soft, non-tender and normal bowel sounds Musculoskeletal:no cyanosis of digits and no clubbing . There is mild chest wall tenderness around the port sites  NEURO: alert & oriented x 3 with fluent speech, no focal motor/sensory deficits  LABORATORY  DATA:  I have reviewed the data as listed    Component Value Date/Time   NA 140 06/18/2015 0805   NA 139 02/10/2015 1202   K 4.2 06/18/2015 0805   K 3.4* 02/10/2015 1202   CL 108 02/10/2015 1202   CO2 26 06/18/2015 0805   CO2 26 02/10/2015 1202   GLUCOSE 77 06/18/2015 0805   GLUCOSE 81 02/10/2015 1202   BUN 10.0 06/18/2015 0805   BUN <5* 02/10/2015 1202   CREATININE 0.7 06/18/2015 0805   CREATININE 0.77 02/10/2015 1202   CREATININE 0.66 12/04/2014 1014   CALCIUM 9.2 06/18/2015 0805   CALCIUM 8.4* 02/10/2015 1202   PROT 6.8 06/18/2015 0805   PROT 7.1 02/08/2015 1158   ALBUMIN 3.7 06/18/2015 0805   ALBUMIN 3.9 02/08/2015 1158   AST 15 06/18/2015 0805   AST 25 02/08/2015 1158   ALT 16 06/18/2015 0805   ALT 22 02/08/2015 1158   ALKPHOS 63 06/18/2015 0805   ALKPHOS 66 02/08/2015 1158   BILITOT 0.85 06/18/2015 0805   BILITOT 0.5 02/08/2015 1158   GFRNONAA >60 02/10/2015 1202   GFRNONAA >89 12/04/2014 1014   GFRAA >60 02/10/2015 1202   GFRAA >89 12/04/2014 1014    No results found for: SPEP, UPEP  Lab Results  Component Value Date   WBC  3.2* 06/18/2015   NEUTROABS 0.8* 06/18/2015   HGB 13.0 06/18/2015   HCT 39.0 06/18/2015   MCV 94.0 06/18/2015   PLT 273 06/18/2015      Chemistry      Component Value Date/Time   NA 140 06/18/2015 0805   NA 139 02/10/2015 1202   K 4.2 06/18/2015 0805   K 3.4* 02/10/2015 1202   CL 108 02/10/2015 1202   CO2 26 06/18/2015 0805   CO2 26 02/10/2015 1202   BUN 10.0 06/18/2015 0805   BUN <5* 02/10/2015 1202   CREATININE 0.7 06/18/2015 0805   CREATININE 0.77 02/10/2015 1202   CREATININE 0.66 12/04/2014 1014      Component Value Date/Time   CALCIUM 9.2 06/18/2015 0805   CALCIUM 8.4* 02/10/2015 1202   ALKPHOS 63 06/18/2015 0805   ALKPHOS 66 02/08/2015 1158   AST 15 06/18/2015 0805   AST 25 02/08/2015 1158   ALT 16 06/18/2015 0805   ALT 22 02/08/2015 1158   BILITOT 0.85 06/18/2015 0805   BILITOT 0.5 02/08/2015 1158      ASSESSMENT & PLAN:  Chest wall pain She has significant chest wall pain related to placement of the port. She admitted she lifted her niece recently and might have strained the musculature around the port site. Examination is benign. I recommend we keep the port until her next PET/CT scan before we decide to take it out. 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 Examination is benign. PET/CT scan is scheduled for the next 2 weeks. Continue supportive care   No orders of the defined types were placed in this encounter.   All questions were answered. The patient knows to call the clinic with any problems, questions or concerns. No barriers to learning was detected. I spent 10 minutes counseling the patient face to face. The total time spent in the appointment was 15 minutes and more than 50% was on counseling and review of test results     [facility], [place], MD 06/27/2015 8:54 AM

## 2015-06-27 NOTE — ED Notes (Signed)
Pt presents with left shoulder pain. Pt reports she is a former cancer patient, in remission, last official treatment on 2/21. Pt is here because she said she is aching in her left shoulder area and the skin on her right hand is peeling. Hx of same.

## 2015-06-27 NOTE — Discharge Instructions (Signed)
You have been seen today for hand pain and a small wound. Follow-up with your PCP or return to the ED for a wound check in 2 days to assure proper healing. Also return to the ED should symptoms worsen. Please take all of your antibiotics until finished!   You may develop abdominal discomfort or diarrhea from the antibiotic.  You may help offset this with probiotics which you can buy or get in yogurt. Do not eat or take the probiotics until 2 hours after your antibiotic. Ibuprofen or Tylenol may be used for pain.

## 2015-06-28 ENCOUNTER — Emergency Department (HOSPITAL_COMMUNITY)
Admission: EM | Admit: 2015-06-28 | Discharge: 2015-06-28 | Disposition: A | Discharge status: Home / Self Care | Payer: Medicaid Other | Attending: Emergency Medicine | Admitting: Emergency Medicine

## 2015-06-28 ENCOUNTER — Encounter (HOSPITAL_COMMUNITY): Payer: Self-pay | Admitting: Emergency Medicine

## 2015-06-28 ENCOUNTER — Emergency Department (HOSPITAL_COMMUNITY): Payer: Medicaid Other

## 2015-06-28 DIAGNOSIS — Z8571 Personal history of Hodgkin lymphoma: Secondary | ICD-10-CM | POA: Diagnosis not present

## 2015-06-28 DIAGNOSIS — Z79899 Other long term (current) drug therapy: Secondary | ICD-10-CM | POA: Diagnosis not present

## 2015-06-28 DIAGNOSIS — M25511 Pain in right shoulder: Secondary | ICD-10-CM

## 2015-06-28 DIAGNOSIS — Z8709 Personal history of other diseases of the respiratory system: Secondary | ICD-10-CM | POA: Diagnosis not present

## 2015-06-28 DIAGNOSIS — Z862 Personal history of diseases of the blood and blood-forming organs and certain disorders involving the immune mechanism: Secondary | ICD-10-CM | POA: Insufficient documentation

## 2015-06-28 DIAGNOSIS — Y998 Other external cause status: Secondary | ICD-10-CM | POA: Diagnosis not present

## 2015-06-28 DIAGNOSIS — Y9389 Activity, other specified: Secondary | ICD-10-CM | POA: Diagnosis not present

## 2015-06-28 DIAGNOSIS — Z86018 Personal history of other benign neoplasm: Secondary | ICD-10-CM | POA: Diagnosis not present

## 2015-06-28 DIAGNOSIS — Y92410 Unspecified street and highway as the place of occurrence of the external cause: Secondary | ICD-10-CM | POA: Insufficient documentation

## 2015-06-28 DIAGNOSIS — F329 Major depressive disorder, single episode, unspecified: Secondary | ICD-10-CM | POA: Diagnosis not present

## 2015-06-28 DIAGNOSIS — B20 Human immunodeficiency virus [HIV] disease: Secondary | ICD-10-CM | POA: Insufficient documentation

## 2015-06-28 DIAGNOSIS — Z8639 Personal history of other endocrine, nutritional and metabolic disease: Secondary | ICD-10-CM | POA: Diagnosis not present

## 2015-06-28 DIAGNOSIS — S4991XA Unspecified injury of right shoulder and upper arm, initial encounter: Secondary | ICD-10-CM | POA: Insufficient documentation

## 2015-06-28 DIAGNOSIS — F17210 Nicotine dependence, cigarettes, uncomplicated: Secondary | ICD-10-CM | POA: Diagnosis not present

## 2015-06-28 DIAGNOSIS — Z792 Long term (current) use of antibiotics: Secondary | ICD-10-CM | POA: Insufficient documentation

## 2015-06-28 DIAGNOSIS — Z8719 Personal history of other diseases of the digestive system: Secondary | ICD-10-CM | POA: Insufficient documentation

## 2015-06-28 MED ORDER — KETOROLAC TROMETHAMINE 60 MG/2ML IM SOLN
30.0000 mg | Freq: Once | INTRAMUSCULAR | Status: AC
  Administered 2015-06-28: 30 mg via INTRAMUSCULAR
  Filled 2015-06-28: qty 2

## 2015-06-28 MED ORDER — METHOCARBAMOL 500 MG PO TABS: 500.0000 mg | ORAL_TABLET | Freq: Two times a day (BID) | ORAL | Status: DC
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
## 2015-07-04 ENCOUNTER — Ambulatory Visit (INDEPENDENT_AMBULATORY_CARE_PROVIDER_SITE_OTHER): Payer: Medicaid Other | Admitting: Internal Medicine

## 2015-07-04 VITALS — BP 116/71 | HR 86 | Temp 98.4°F

## 2015-07-04 DIAGNOSIS — B20 Human immunodeficiency virus [HIV] disease: Secondary | ICD-10-CM

## 2015-07-04 DIAGNOSIS — C8174 Other classical Hodgkin lymphoma, lymph nodes of axilla and upper limb: Secondary | ICD-10-CM | POA: Diagnosis not present

## 2015-07-04 NOTE — Progress Notes (Signed)
   Subjective:    Patient ID: [name], female    DOB: Jun 12, 1985, 30 y.o.   MRN: [id number]  HPI She comes in for follow-up of HIV.  She has lymphoma and after some delay in teratment due to non-compliance back on chemotherapy via oncology here.  Has had some weight loss, eating ok, no diarrhea, no rashes.  Last CD4 490 and viral load 200.  Dry scaly skin between thumb and finger.     Review of Systems  Constitutional: Negative for fever, appetite change and fatigue.  Gastrointestinal: Negative for nausea and diarrhea.  Skin: Negative for rash.  Neurological: Negative for dizziness and light-headedness.       Objective:   Physical Exam  Constitutional: No distress.  HENT:  Mouth/Throat: No oropharyngeal exudate.  Eyes: No scleral icterus.  Cardiovascular: Normal rate, regular rhythm and normal heart sounds.   No murmur heard. Pulmonary/Chest: Effort normal and breath sounds normal. No respiratory distress. She has no wheezes.  Lymphadenopathy:    She has no cervical adenopathy.  Skin: No rash noted.  Psychiatric: She has a normal mood and affect.          Assessment & Plan:

## 2015-07-04 NOTE — Assessment & Plan Note (Signed)
Seems to be doing well.  Labs today and rtc 4 months.

## 2015-07-04 NOTE — Assessment & Plan Note (Signed)
Has completed treatment.  I congratulated her on it and I appreciate Dr. Alvy Bimler getting her back in and treating.

## 2015-07-05 ENCOUNTER — Other Ambulatory Visit: Payer: Medicaid Other

## 2015-07-05 LAB — T-HELPER CELL (CD4) - (RCID CLINIC ONLY)
CD4 % Helper T Cell: 47 % (ref 33–55)
CD4 T CELL ABS: 690 /uL (ref 400–2700)

## 2015-07-07 LAB — HIV-1 RNA QUANT-NO REFLEX-BLD
HIV 1 RNA Quant: 153 copies/mL — ABNORMAL HIGH (ref <20)
HIV-1 RNA Quant, Log: 2.18 Log copies/mL — ABNORMAL HIGH (ref <1.30)

## 2015-07-11 ENCOUNTER — Other Ambulatory Visit (HOSPITAL_COMMUNITY)
Admission: RE | Admit: 2015-07-11 | Discharge: 2015-07-11 | Disposition: A | Discharge status: Home / Self Care | Payer: Medicaid Other | Source: Ambulatory Visit | Attending: Hematology and Oncology | Admitting: Hematology and Oncology

## 2015-07-11 ENCOUNTER — Encounter (HOSPITAL_COMMUNITY)
Admission: RE | Admit: 2015-07-11 | Discharge: 2015-07-11 | Disposition: A | Discharge status: Home / Self Care | Payer: Medicaid Other | Source: Ambulatory Visit | Attending: Hematology and Oncology | Admitting: Hematology and Oncology

## 2015-07-11 ENCOUNTER — Other Ambulatory Visit (HOSPITAL_BASED_OUTPATIENT_CLINIC_OR_DEPARTMENT_OTHER): Payer: Medicaid Other

## 2015-07-11 ENCOUNTER — Ambulatory Visit: Payer: Medicaid Other

## 2015-07-11 DIAGNOSIS — C8190 Hodgkin lymphoma, unspecified, unspecified site: Secondary | ICD-10-CM | POA: Insufficient documentation

## 2015-07-11 DIAGNOSIS — C8174 Other classical Hodgkin lymphoma, lymph nodes of axilla and upper limb: Secondary | ICD-10-CM | POA: Diagnosis present

## 2015-07-11 DIAGNOSIS — Z95828 Presence of other vascular implants and grafts: Secondary | ICD-10-CM

## 2015-07-11 LAB — CBC WITH DIFFERENTIAL/PLATELET
BASO%: 0.3 % (ref 0.0–2.0)
BASOS ABS: 0 10*3/uL (ref 0.0–0.1)
EOS ABS: 0 10*3/uL (ref 0.0–0.5)
EOS%: 0.1 % (ref 0.0–7.0)
HCT: 37.8 % (ref 34.8–46.6)
HEMOGLOBIN: 12.8 g/dL (ref 11.6–15.9)
LYMPH#: 1.2 10*3/uL (ref 0.9–3.3)
LYMPH%: 12.8 % — ABNORMAL LOW (ref 14.0–49.7)
MCH: 31.1 pg (ref 25.1–34.0)
MCHC: 33.9 g/dL (ref 31.5–36.0)
MCV: 91.7 fL (ref 79.5–101.0)
MONO#: 0.8 10*3/uL (ref 0.1–0.9)
MONO%: 8.2 % (ref 0.0–14.0)
NEUT#: 7.5 10*3/uL — ABNORMAL HIGH (ref 1.5–6.5)
NEUT%: 78.6 % — ABNORMAL HIGH (ref 38.4–76.8)
Platelets: 259 10*3/uL (ref 145–400)
RBC: 4.12 10*6/uL (ref 3.70–5.45)
RDW: 15.5 % — AB (ref 11.2–14.5)
WBC: 9.6 10*3/uL (ref 3.9–10.3)

## 2015-07-11 LAB — COMPREHENSIVE METABOLIC PANEL
ALT: 20 U/L (ref 0–55)
AST: 19 U/L (ref 5–34)
Albumin: 3.8 g/dL (ref 3.5–5.0)
Alkaline Phosphatase: 71 U/L (ref 40–150)
Anion Gap: 10 mEq/L (ref 3–11)
BUN: 8.1 mg/dL (ref 7.0–26.0)
CHLORIDE: 106 meq/L (ref 98–109)
CO2: 26 meq/L (ref 22–29)
Calcium: 9.7 mg/dL (ref 8.4–10.4)
Creatinine: 0.8 mg/dL (ref 0.6–1.1)
GLUCOSE: 87 mg/dL (ref 70–140)
POTASSIUM: 4.1 meq/L (ref 3.5–5.1)
SODIUM: 143 meq/L (ref 136–145)
TOTAL PROTEIN: 7.3 g/dL (ref 6.4–8.3)
Total Bilirubin: 0.65 mg/dL (ref 0.20–1.20)

## 2015-07-11 LAB — GLUCOSE, CAPILLARY: Glucose-Capillary: 86 mg/dL (ref 65–99)

## 2015-07-11 LAB — PREGNANCY, URINE: Preg Test, Ur: NEGATIVE

## 2015-07-11 LAB — LACTATE DEHYDROGENASE: LDH: 213 U/L (ref 125–245)

## 2015-07-11 MED ORDER — SODIUM CHLORIDE 0.9% FLUSH
10.0000 mL | INTRAVENOUS | Status: DC | PRN
  Administered 2015-07-11: 10 mL via INTRAVENOUS
  Filled 2015-07-11: qty 10

## 2015-07-11 MED ORDER — FLUDEOXYGLUCOSE F - 18 (FDG) INJECTION
5.1000 | Freq: Once | INTRAVENOUS | Status: AC | PRN
  Administered 2015-07-11: 5.1 via INTRAVENOUS

## 2015-07-11 NOTE — Patient Instructions (Signed)
patient to have port needle de-accessed after PET scan today 07/11/15 before leaving [facility].  Implanted [facility] Guide An implanted port is a type of central line that is placed under the skin. Central lines are used to provide IV access when treatment or nutrition needs to be given through a person's veins. Implanted ports are used for long-term IV access. An implanted port may be placed because:   You need IV medicine that would be irritating to the small veins in your hands or arms.   You need long-term IV medicines, such as antibiotics.   You need IV nutrition for a long period.   You need frequent blood draws for lab tests.   You need dialysis.  Implanted ports are usually placed in the chest area, but they can also be placed in the upper arm, the abdomen, or the leg. An implanted port has two main parts:   Reservoir. The reservoir is round and will appear as a small, raised area under your skin. The reservoir is the part where a needle is inserted to give medicines or draw blood.   Catheter. The catheter is a thin, flexible tube that extends from the reservoir. The catheter is placed into a large vein. Medicine that is inserted into the reservoir goes into the catheter and then into the vein.  HOW WILL I CARE FOR MY INCISION SITE? Do not get the incision site wet. Bathe or shower as directed by your health care provider.  HOW IS MY PORT ACCESSED? Special steps must be taken to access the port:   Before the port is accessed, a numbing cream can be placed on the skin. This helps numb the skin over the port site.   Your health care provider uses a sterile technique to access the port.  Your health care provider must put on a mask and sterile gloves.  The skin over your port is cleaned carefully with an antiseptic and allowed to dry.  The port is gently pinched between sterile gloves, and a needle is inserted into the port.  Only "non-coring" port  needles should be used to access the port. Once the port is accessed, a blood return should be checked. This helps ensure that the port is in the vein and is not clogged.   If your port needs to remain accessed for a constant infusion, a clear (transparent) bandage will be placed over the needle site. The bandage and needle will need to be changed every week, or as directed by your health care provider.   Keep the bandage covering the needle clean and dry. Do not get it wet. Follow your health care provider's instructions on how to take a shower or bath while the port is accessed.   If your port does not need to stay accessed, no bandage is needed over the port.  WHAT IS FLUSHING? Flushing helps keep the port from getting clogged. Follow your health care provider's instructions on how and when to flush the port. Ports are usually flushed with saline solution or a medicine called heparin. The need for flushing will depend on how the port is used.   If the port is used for intermittent medicines or blood draws, the port will need to be flushed:   After medicines have been given.   After blood has been drawn.   As part of routine maintenance.   If a constant infusion is running, the port may not need to be flushed.  HOW LONG  WILL MY PORT STAY IMPLANTED? The port can stay in for as long as your health care provider thinks it is needed. When it is time for the port to come out, surgery will be done to remove it. The procedure is similar to the one performed when the port was put in.  WHEN SHOULD I SEEK IMMEDIATE MEDICAL CARE? When you have an implanted port, you should seek immediate medical care if:   You notice a bad smell coming from the incision site.   You have swelling, redness, or drainage at the incision site.   You have more swelling or pain at the port site or the surrounding area.   You have a fever that is not controlled with medicine.   This information is not  intended to replace advice given to you by your health care provider. Make sure you discuss any questions you have with your health care provider.   Document Released: 04/13/2005 Document Revised: 02/01/2013 Document Reviewed: 12/19/2012 Elsevier Interactive Patient Education [employer].

## 2015-07-12 ENCOUNTER — Ambulatory Visit (HOSPITAL_BASED_OUTPATIENT_CLINIC_OR_DEPARTMENT_OTHER): Payer: Medicaid Other | Admitting: Hematology and Oncology

## 2015-07-12 ENCOUNTER — Telehealth: Payer: Self-pay | Admitting: Hematology and Oncology

## 2015-07-12 ENCOUNTER — Other Ambulatory Visit: Payer: Self-pay | Admitting: Hematology and Oncology

## 2015-07-12 ENCOUNTER — Other Ambulatory Visit: Payer: Medicaid Other

## 2015-07-12 ENCOUNTER — Encounter: Payer: Self-pay | Admitting: Hematology and Oncology

## 2015-07-12 VITALS — BP 114/74 | HR 86 | Temp 98.3°F | Resp 18 | Ht 64.0 in | Wt 109.0 lb

## 2015-07-12 DIAGNOSIS — M899 Disorder of bone, unspecified: Secondary | ICD-10-CM | POA: Diagnosis not present

## 2015-07-12 DIAGNOSIS — C8174 Other classical Hodgkin lymphoma, lymph nodes of axilla and upper limb: Secondary | ICD-10-CM

## 2015-07-12 MED ORDER — TRAMADOL HCL 50 MG PO TABS: 50.0000 mg | ORAL_TABLET | Freq: Four times a day (QID) | ORAL | Status: DC | PRN

## 2015-07-12 NOTE — Telephone Encounter (Signed)
Gave and printed appt sched and avs for pt for March. °

## 2015-07-12 NOTE — Progress Notes (Signed)
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   05/07/2014 Imaging CT scan of the chest show right thoracic inlet lymphadenopathy   06/07/2014 Procedure She underwent ultrasound-guided core biopsy of the neck lymph node   06/07/2014 Pathology Results Accession: [id number] biopsy confirmed diagnosis of Hodgkin lymphoma.   06/15/2014 Imaging Echocardiogram showed preserved ejection fraction   07/09/2014 - 07/12/2014 Hospital Admission She was admitted to the hospital for severe anemia.   07/27/2014 Procedure She had placement of port   07/31/2014 - 09/11/2014 Chemotherapy She received dose adjusted chemotherapy due to abnormal liver function tests and severe anemia. Treatment was delayed due to noncompliance  and subsequently stopped because the patient failed to keep appointments   01/11/2015 Imaging Repeat PET CT scan showed response to treatment   01/28/2015 - 06/18/2015 Chemotherapy ABVD was restarted with full dose.   02/08/2015 - 02/10/2015 Hospital Admission The patient was admitted to the hospital due to pancytopenia and profuse diarrhea. Cultures were negative. She was placed on ciprofloxacin.   02/11/2015 Adverse Reaction Treatment was placed on hold due to recent infection.   04/11/2015 Imaging PET CT scan showed near complete response. Incidental finding of an abnormal bone lesion, indeterminate. She is not symptomatic. Recommendation from Hem [facility] to observe   07/11/2015 Imaging PET CT scan showed abnormal new bone lesions, suggestive of possible disease progression    INTERVAL HISTORY: Please see  below for problem oriented charting. She returns to follow-up on test results. She continues to have discomfort at the port site region, well controlled with tramadol. Denies recent fevers or chills. No bone pain.  REVIEW OF SYSTEMS:   Constitutional: Denies fevers, chills or abnormal weight loss Eyes: Denies blurriness of vision Ears, nose, mouth, throat, and face: Denies mucositis or sore throat Respiratory: Denies cough, dyspnea or wheezes Cardiovascular: Denies palpitation, chest discomfort or lower extremity swelling Gastrointestinal:  Denies nausea, heartburn or change in bowel habits Skin: Denies abnormal skin rashes Lymphatics: Denies new lymphadenopathy or easy bruising Neurological:Denies numbness, tingling or new weaknesses Behavioral/Psych: Mood is stable, no new changes  All other systems were reviewed with the patient and are negative.  I have reviewed the past medical history, past surgical history, social history and family history with the patient and they are unchanged from previous note.  ALLERGIES:  has No Known Allergies.  MEDICATIONS:  Current Outpatient Prescriptions  Medication Sig Dispense Refill  . atazanavir (REYATAZ) 300 MG capsule TAKE ONE CAPSULE BY MOUTH DAILY WITH BREAKFAST. TAKE WITH NORVIR 30 capsule 1  . buPROPion (WELLBUTRIN XL) 150 MG 24 hr tablet Take 150 mg by mouth at bedtime.     . cephALEXin (KEFLEX) 500 MG capsule Take 1 capsule (500 mg total) by mouth 4 (four) times daily. 28 capsule 0  . emtricitabine-tenofovir (TRUVADA) 200-300 MG tablet Take 1 tablet by mouth daily. 30 tablet 1  . hydrocortisone cream 0.5 % Apply 1 application topically 2 (two) times daily. 30 g 1  . ibuprofen (ADVIL,MOTRIN) 200 MG tablet Take 2 tablets (400 mg total) by mouth every 6 (six) hours as needed for moderate pain. 30 tablet 0  .  ketoconazole (NIZORAL) 2 % cream Apply 1 application topically 2 (two) times daily as needed for irritation. 60 g 3  . methocarbamol  (ROBAXIN) 500 MG tablet Take 1 tablet (500 mg total) by mouth 2 (two) times daily. 20 tablet 0  . mupirocin cream (BACTROBAN) 2 % Apply 1 application topically 3 (three) times daily. For 5 days. 15 g 0  . Naphazoline HCl (CLEAR EYES OP) Apply 2 drops to eye 3 (three) times daily as needed (redness).     . ondansetron (ZOFRAN ODT) 8 MG disintegrating tablet Take 1 tablet (8 mg total) by mouth every 8 (eight) hours as needed for nausea or vomiting. 15 tablet 0  . ondansetron (ZOFRAN) 4 MG tablet Take 1 tablet (4 mg total) by mouth every 6 (six) hours. 15 tablet 0  . potassium chloride SA (K-DUR,KLOR-CON) 20 MEQ tablet Take 1 tablet (20 mEq total) by mouth 2 (two) times daily. 60 tablet 0  . ritonavir (NORVIR) 100 MG TABS tablet Take 1 tablet (100 mg total) by mouth daily with breakfast. TAKE WITH REYATAZ 30 tablet 1  . traMADol (ULTRAM) 50 MG tablet Take 1 tablet (50 mg total) by mouth every 6 (six) hours as needed. 60 tablet 0  . traZODone (DESYREL) 50 MG tablet Take 50 mg by mouth at bedtime.     No current facility-administered medications for this visit.    PHYSICAL EXAMINATION: ECOG PERFORMANCE STATUS: 0 - Asymptomatic  Filed Vitals:   07/12/15 0803  BP: 114/74  Pulse: 86  Temp: 98.3 F (36.8 C)  Resp: 18   Filed Weights   07/12/15 0803  Weight: 109 lb (49.442 kg)    GENERAL:alert, no distress and comfortable SKIN: skin color, texture, turgor are normal, no rashes or significant lesions EYES: normal, Conjunctiva are pink and non-injected, sclera clear OROPHARYNX:no exudate, no erythema and lips, buccal mucosa, and tongue normal  NECK: supple, thyroid normal size, non-tender, without nodularity LYMPH:  no palpable lymphadenopathy in the cervical, axillary or inguinal LUNGS: clear to auscultation and percussion with normal breathing effort HEART: regular rate & rhythm and no murmurs and no lower extremity edema ABDOMEN:abdomen soft, non-tender and normal bowel  sounds Musculoskeletal:no cyanosis of digits and no clubbing  NEURO: alert & oriented x 3 with fluent speech, no focal motor/sensory deficits  LABORATORY DATA:  I have reviewed the data as listed    Component Value Date/Time   NA 143 07/11/2015 0758   NA 139 02/10/2015 1202   K 4.1 07/11/2015 0758   K 3.4* 02/10/2015 1202   CL 108 02/10/2015 1202   CO2 26 07/11/2015 0758   CO2 26 02/10/2015 1202   GLUCOSE 87 07/11/2015 0758   GLUCOSE 81 02/10/2015 1202   BUN 8.1 07/11/2015 0758   BUN <5* 02/10/2015 1202   CREATININE 0.8 07/11/2015 0758   CREATININE 0.77 02/10/2015 1202   CREATININE 0.66 12/04/2014 1014   CALCIUM 9.7 07/11/2015 0758   CALCIUM 8.4* 02/10/2015 1202   PROT 7.3 07/11/2015 0758   PROT 7.1 02/08/2015 1158   ALBUMIN 3.8 07/11/2015 0758   ALBUMIN 3.9 02/08/2015 1158   AST 19 07/11/2015 0758   AST 25 02/08/2015 1158   ALT 20 07/11/2015 0758   ALT 22 02/08/2015 1158   ALKPHOS 71 07/11/2015 0758   ALKPHOS 66 02/08/2015 1158   BILITOT 0.65 07/11/2015 0758   BILITOT 0.5 02/08/2015 1158   GFRNONAA >60 02/10/2015 1202   GFRNONAA >89 12/04/2014 1014   GFRAA >60 02/10/2015 1202  GFRAA >89 12/04/2014 1014    No results found for: SPEP, UPEP  Lab Results  Component Value Date   WBC 9.6 07/11/2015   NEUTROABS 7.5* 07/11/2015   HGB 12.8 07/11/2015   HCT 37.8 07/11/2015   MCV 91.7 07/11/2015   PLT 259 07/11/2015      Chemistry      Component Value Date/Time   NA 143 07/11/2015 0758   NA 139 02/10/2015 1202   K 4.1 07/11/2015 0758   K 3.4* 02/10/2015 1202   CL 108 02/10/2015 1202   CO2 26 07/11/2015 0758   CO2 26 02/10/2015 1202   BUN 8.1 07/11/2015 0758   BUN <5* 02/10/2015 1202   CREATININE 0.8 07/11/2015 0758   CREATININE 0.77 02/10/2015 1202   CREATININE 0.66 12/04/2014 1014      Component Value Date/Time   CALCIUM 9.7 07/11/2015 0758   CALCIUM 8.4* 02/10/2015 1202   ALKPHOS 71 07/11/2015 0758   ALKPHOS 66 02/08/2015 1158   AST 19 07/11/2015  0758   AST 25 02/08/2015 1158   ALT 20 07/11/2015 0758   ALT 22 02/08/2015 1158   BILITOT 0.65 07/11/2015 0758   BILITOT 0.5 02/08/2015 1158       RADIOGRAPHIC STUDIES: I have personally reviewed the radiological images as listed and agreed with the findings in the report. Nm Pet Image Restag (ps) Skull Base To Thigh  07/11/2015  CLINICAL DATA:  Subsequent treatment strategy for Hodgkin's lymphoma. EXAM: NUCLEAR MEDICINE PET SKULL BASE TO THIGH TECHNIQUE: 5.1 mCi F-18 FDG was injected intravenously. Full-ring PET imaging was performed from the skull base to thigh after the radiotracer. CT data was obtained and used for attenuation correction and anatomic localization. FASTING BLOOD GLUCOSE:  Value: 86 mg/dl COMPARISON:  PET-CT dated 04/11/2015 FINDINGS: NECK No hypermetabolic lymph nodes in the neck. CHEST Lungs are essentially clear. No suspicious pulmonary nodules. No focal consolidation. No pleural effusion or pneumothorax. Heart is normal in size. No pericardial effusion. Right chest port terminates at the cavoatrial junction. Small right axillary nodes measuring up to 7 mm short axis, grossly unchanged, max SUV 2.0 (matching that of blood pool, Deauville criteria 2), previously 2.4. Mild hypermetabolism in the bilateral hilar regions, max SUV 2.3 on the right and 2.7 on the left (less than liver background, Deauville criteria 3), previously 2.1 on the right and 1.9 on the left. ABDOMEN/PELVIS No abnormal hypermetabolic activity within the liver, pancreas, adrenal glands, or spleen. 7 mm short axis left inguinal node, unchanged, max SUV 1.8 (Deauville criteria 2), previously 2.3. Otherwise, no hypermetabolic lymph nodes in the abdomen or pelvis. SKELETON Focal hypermetabolism in the left L3 vertebral body, max SUV 8.5, previously 13.8. New hypermetabolic lesion in the right iliac bone, max SUV 7.1 (PET image 127). IMPRESSION: Overall mixed response to therapy. Small right axillary, bilateral hilar,  and left inguinal nodes are stable versus mildly improved, Deauville criteria 2-3. 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 New hypermetabolic osseous metastasis in the right iliac bone. Electronically Signed   By: Julian Hy M.D.   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 I will arrange for CT-guided biopsy of the right pelvic lesion for further evaluation. If she proved to have refractory disease, I will have to start her on salvage chemotherapy followed by referral for possible  bone marrow transplant. She agreed with the plan   Orders Placed This Encounter  Procedures  . CT Biopsy    Standing Status: Future     Number of Occurrences:      Standing Expiration Date: 08/15/2016    Order Specific Question:  Reason for Exam (SYMPTOM  OR DIAGNOSIS REQUIRED)    Answer:  CT biopsy of pelvis lesion seen on PET. Patient has Hodgkin lymphoma. Need to exclude refractory disease    Order Specific Question:  Preferred imaging location?    Answer:  [facility]    Order Specific Question:  Is the patient pregnant?    Answer:  No   All questions were answered. The patient knows to call the clinic with any problems, questions or concerns. No barriers to learning was detected. I spent 15 minutes counseling the patient face to face. The total time spent in the appointment was 20 minutes and more than 50% was on counseling and review of test results     [facility], [place], MD 07/12/2015 8:22 AM

## 2015-07-12 NOTE — Assessment & Plan Note (Signed)
Unfortunately, CT scan show evidence of new bone lesion, suspicious for refractory disease. I will arrange for CT-guided biopsy of the right pelvic lesion for further evaluation. If she proved to have refractory disease, I will have to start her on salvage chemotherapy followed by referral for possible bone marrow transplant. She agreed with the plan

## 2015-07-17 ENCOUNTER — Other Ambulatory Visit: Payer: Self-pay | Admitting: *Deleted

## 2015-07-17 ENCOUNTER — Encounter: Payer: Self-pay | Admitting: *Deleted

## 2015-07-17 ENCOUNTER — Telehealth: Payer: Self-pay | Admitting: Hematology and Oncology

## 2015-07-17 NOTE — Telephone Encounter (Signed)
Left vm to inform patient of changed 3/29 appt to 3/31 at 1230 pm per 3/22 pof

## 2015-07-22 ENCOUNTER — Other Ambulatory Visit: Payer: Self-pay | Admitting: Radiology

## 2015-07-23 ENCOUNTER — Encounter (HOSPITAL_COMMUNITY): Payer: Self-pay

## 2015-07-23 ENCOUNTER — Ambulatory Visit (HOSPITAL_COMMUNITY)
Admission: RE | Admit: 2015-07-23 | Discharge: 2015-07-23 | Disposition: A | Discharge status: Home / Self Care | Payer: Medicaid Other | Source: Ambulatory Visit | Attending: Hematology and Oncology | Admitting: Hematology and Oncology

## 2015-07-23 DIAGNOSIS — E876 Hypokalemia: Secondary | ICD-10-CM | POA: Diagnosis not present

## 2015-07-23 DIAGNOSIS — F329 Major depressive disorder, single episode, unspecified: Secondary | ICD-10-CM | POA: Diagnosis not present

## 2015-07-23 DIAGNOSIS — B20 Human immunodeficiency virus [HIV] disease: Secondary | ICD-10-CM | POA: Diagnosis not present

## 2015-07-23 DIAGNOSIS — D013 Carcinoma in situ of anus and anal canal: Secondary | ICD-10-CM | POA: Insufficient documentation

## 2015-07-23 DIAGNOSIS — D649 Anemia, unspecified: Secondary | ICD-10-CM | POA: Insufficient documentation

## 2015-07-23 DIAGNOSIS — Z9221 Personal history of antineoplastic chemotherapy: Secondary | ICD-10-CM | POA: Diagnosis not present

## 2015-07-23 DIAGNOSIS — C7951 Secondary malignant neoplasm of bone: Secondary | ICD-10-CM | POA: Diagnosis present

## 2015-07-23 DIAGNOSIS — B009 Herpesviral infection, unspecified: Secondary | ICD-10-CM | POA: Diagnosis not present

## 2015-07-23 DIAGNOSIS — Z95828 Presence of other vascular implants and grafts: Secondary | ICD-10-CM | POA: Insufficient documentation

## 2015-07-23 DIAGNOSIS — M899 Disorder of bone, unspecified: Secondary | ICD-10-CM | POA: Diagnosis present

## 2015-07-23 DIAGNOSIS — C8174 Other classical Hodgkin lymphoma, lymph nodes of axilla and upper limb: Secondary | ICD-10-CM | POA: Insufficient documentation

## 2015-07-23 LAB — CBC WITH DIFFERENTIAL/PLATELET
BASOS PCT: 0 %
Basophils Absolute: 0 10*3/uL (ref 0.0–0.1)
Eosinophils Absolute: 0.2 10*3/uL (ref 0.0–0.7)
Eosinophils Relative: 5 %
HEMATOCRIT: 39.6 % (ref 36.0–46.0)
HEMOGLOBIN: 13.5 g/dL (ref 12.0–15.0)
LYMPHS ABS: 1.5 10*3/uL (ref 0.7–4.0)
LYMPHS PCT: 31 %
MCH: 31.2 pg (ref 26.0–34.0)
MCHC: 34.1 g/dL (ref 30.0–36.0)
MCV: 91.5 fL (ref 78.0–100.0)
MONOS PCT: 11 %
Monocytes Absolute: 0.5 10*3/uL (ref 0.1–1.0)
NEUTROS ABS: 2.6 10*3/uL (ref 1.7–7.7)
NEUTROS PCT: 53 %
Platelets: 292 10*3/uL (ref 150–400)
RBC: 4.33 MIL/uL (ref 3.87–5.11)
RDW: 16.5 % — ABNORMAL HIGH (ref 11.5–15.5)
WBC: 4.9 10*3/uL (ref 4.0–10.5)

## 2015-07-23 LAB — BONE MARROW EXAM

## 2015-07-23 LAB — PROTIME-INR
INR: 1.15 (ref 0.00–1.49)
Prothrombin Time: 14.5 seconds (ref 11.6–15.2)

## 2015-07-23 MED ORDER — SODIUM CHLORIDE 0.9 % IV SOLN
INTRAVENOUS | Status: DC
  Administered 2015-07-23: 07:00:00 via INTRAVENOUS

## 2015-07-23 MED ORDER — MIDAZOLAM HCL 2 MG/2ML IJ SOLN
INTRAMUSCULAR | Status: AC | PRN
  Administered 2015-07-23: 1 mg via INTRAVENOUS
  Administered 2015-07-23 (×2): 0.5 mg via INTRAVENOUS

## 2015-07-23 MED ORDER — FENTANYL CITRATE (PF) 100 MCG/2ML IJ SOLN
INTRAMUSCULAR | Status: AC | PRN
  Administered 2015-07-23 (×4): 25 ug via INTRAVENOUS

## 2015-07-23 MED ORDER — FENTANYL CITRATE (PF) 100 MCG/2ML IJ SOLN
INTRAMUSCULAR | Status: AC
  Filled 2015-07-23: qty 4

## 2015-07-23 MED ORDER — MIDAZOLAM HCL 2 MG/2ML IJ SOLN
INTRAMUSCULAR | Status: AC
  Filled 2015-07-23: qty 4

## 2015-07-23 NOTE — Discharge Instructions (Signed)
Bone Marrow Aspiration and Bone Marrow Biopsy, Care After Refer to this sheet in the next few weeks. These instructions provide you with information about caring for yourself after your procedure. Your health care provider may also give you more specific instructions. Your treatment has been planned according to current medical practices, but problems sometimes occur. Call your health care provider if you have any problems or questions after your procedure. WHAT TO EXPECT AFTER THE PROCEDURE After your procedure, it is common to have:  Soreness or tenderness around the puncture site.  Bruising. HOME CARE INSTRUCTIONS  Take medicines only as directed by your health care provider.  Follow your health care provider's instructions about:  Puncture site care.  Bandage (dressing) changes and removal.  Bathe and shower as directed by your health care provider.  Check your puncture site every day for signs of infection. Watch for:  Redness, swelling, or pain.  Fluid, blood, or pus.  Return to your normal activities as directed by your health care provider.  Keep all follow-up visits as directed by your health care provider. This is important. SEEK MEDICAL CARE IF:  You have a fever.  You have uncontrollable bleeding.  You have redness, swelling, or pain at the site of your puncture.  You have fluid, blood, or pus coming from your puncture site.   This information is not intended to replace advice given to you by your health care provider. Make sure you discuss any questions you have with your health care provider.   Document Released: 10/31/2004 Document Revised: 08/28/2014 Document Reviewed: 04/04/2014 Elsevier Interactive Patient Education 2016 [employer] Moderate Conscious Sedation, Adult, Care After Refer to this sheet in the next few weeks. These instructions provide you with information on caring for yourself after your procedure. Your health care provider may also give  you more specific instructions. Your treatment has been planned according to current medical practices, but problems sometimes occur. Call your health care provider if you have any problems or questions after your procedure. WHAT TO EXPECT AFTER THE PROCEDURE  After your procedure:  You may feel sleepy, clumsy, and have poor balance for several hours.  Vomiting may occur if you eat too soon after the procedure. HOME CARE INSTRUCTIONS  Do not participate in any activities where you could become injured for at least 24 hours. Do not:  Drive.  Swim.  Ride a bicycle.  Operate heavy machinery.  Cook.  Use power tools.  Climb ladders.  Work from a high place.  Do not make important decisions or sign legal documents until you are improved.  If you vomit, drink water, juice, or soup when you can drink without vomiting. Make sure you have little or no nausea before eating solid foods.  Only take over-the-counter or prescription medicines for pain, discomfort, or fever as directed by your health care provider.  Make sure you and your family fully understand everything about the medicines given to you, including what side effects may occur.  You should not drink alcohol, take sleeping pills, or take medicines that cause drowsiness for at least 24 hours.  If you smoke, do not smoke without supervision.  If you are feeling better, you may resume normal activities 24 hours after you were sedated.  Keep all appointments with your health care provider. SEEK MEDICAL CARE IF:  Your skin is pale or bluish in color.  You continue to feel nauseous or vomit.  Your pain is getting worse and is not helped by medicine.  You have bleeding or swelling.  You are still sleepy or feeling clumsy after 24 hours. SEEK IMMEDIATE MEDICAL CARE IF:  You develop a rash.  You have difficulty breathing.  You develop any type of allergic problem.  You have a fever. MAKE SURE YOU:  Understand  these instructions.  Will watch your condition.  Will get help right away if you are not doing well or get worse.   This information is not intended to replace advice given to you by your health care provider. Make sure you discuss any questions you have with your health care provider.   Document Released: 02/01/2013 Document Revised: 05/04/2014 Document Reviewed: 02/01/2013 Elsevier Interactive Patient Education [employer].

## 2015-07-23 NOTE — H&P (Signed)
   Referring Physician(s): Gorsuch,Ni  Supervising Physician: Henn, Adam  Chief Complaint:  "I'm having a biopsy"  Subjective:  Patient familiar to IR service from prior right cervical lymph node biopsy in February 2016 as well as Port-A-Cath placement in April 2016. She has a known history of Hodgkin's lymphoma status post chemotherapy. Recent follow-up PET scan is revealed mixed response to therapy with a new hypermetabolic osseous metastasis in the right iliac bone. She presents today for CT-guided right iliac bone/bone marrow biopsy for further evaluation. She currently denies fever, headache, chest pain, dyspnea, cough, abdominal/back pain, nausea/vomiting or abnormal bleeding. Additional past medical history as below. Past Medical History  Diagnosis Date  . HIV (human immunodeficiency virus infection) (HCC)   . Depression   . Anemia   . HSV (herpes simplex virus) infection   . History of shingles   . AIN III (anal intraepithelial neoplasia III)   . Condyloma acuminatum in female   . History of chronic bronchitis   . History of esophagitis     CANDIDA  . Periodontitis, chronic   . Hodgkin's lymphoma (HCC) 06/12/2014  . Cancer (HCC)     Hodgkin lymphoma  . Hypokalemia 07/17/2014  . Screening examination for venereal disease 10/30/2013  . Chest wall pain 06/27/2015   Past Surgical History  Procedure Laterality Date  . Dilation and curettage of uterus  2005    MISSED AB  . Examination under anesthesia N/A 09/23/2012    Procedure: EXAM UNDER ANESTHESIA;  Surgeon: Steven C. Gross, MD;  Location: [facility];  Service: General;  Laterality: N/A;  . Laser ablation condolamata N/A 09/23/2012    Procedure: REMOVAL/ABLATION  ABLATION CONDOLAMATA WARTS;  Surgeon: Steven C. Gross, MD;  Location: [facility];  Service: General;  Laterality: N/A;     Allergies: Review of patient's allergies indicates no known allergies.  Medications: Prior to Admission  medications   Medication Sig Start Date End Date Taking? Authorizing Provider  atazanavir (REYATAZ) 300 MG capsule TAKE ONE CAPSULE BY MOUTH DAILY WITH BREAKFAST. TAKE WITH NORVIR 06/03/15   Robert W Comer, MD  buPROPion (WELLBUTRIN XL) 150 MG 24 hr tablet Take 150 mg by mouth at bedtime.     Historical Provider, MD  cephALEXin (KEFLEX) 500 MG capsule Take 1 capsule (500 mg total) by mouth 4 (four) times daily. 06/27/15   Shawn C Joy, PA-C  emtricitabine-tenofovir (TRUVADA) 200-300 MG tablet Take 1 tablet by mouth daily. 06/03/15   Robert W Comer, MD  hydrocortisone cream 0.5 % Apply 1 application topically 2 (two) times daily. 12/19/14   Robert W Comer, MD  ibuprofen (ADVIL,MOTRIN) 200 MG tablet Take 2 tablets (400 mg total) by mouth every 6 (six) hours as needed for moderate pain. 04/24/15   Ni Gorsuch, MD  ketoconazole (NIZORAL) 2 % cream Apply 1 application topically 2 (two) times daily as needed for irritation. 12/25/14   Robert W Comer, MD  methocarbamol (ROBAXIN) 500 MG tablet Take 1 tablet (500 mg total) by mouth 2 (two) times daily. 06/28/15   Shawn C Joy, PA-C  mupirocin cream (BACTROBAN) 2 % Apply 1 application topically 3 (three) times daily. For 5 days. 05/30/15   Kayla Rose, PA-C  Naphazoline HCl (CLEAR EYES OP) Apply 2 drops to eye 3 (three) times daily as needed (redness).     Historical Provider, MD  ondansetron (ZOFRAN ODT) 8 MG disintegrating tablet Take 1 tablet (8 mg total) by mouth every 8 (eight) hours as needed   for nausea or vomiting. 02/07/15   Tatyana Kirichenko, PA-C  ondansetron (ZOFRAN) 4 MG tablet Take 1 tablet (4 mg total) by mouth every 6 (six) hours. 01/10/15   Emily Roe Nguyen, MD  potassium chloride SA (K-DUR,KLOR-CON) 20 MEQ tablet Take 1 tablet (20 mEq total) by mouth 2 (two) times daily. 07/17/14   Ni Gorsuch, MD  ritonavir (NORVIR) 100 MG TABS tablet Take 1 tablet (100 mg total) by mouth daily with breakfast. TAKE WITH REYATAZ 06/03/15   Robert W Comer, MD  traMADol (ULTRAM) 50  MG tablet Take 1 tablet (50 mg total) by mouth every 6 (six) hours as needed. 07/12/15   Ni Gorsuch, MD  traZODone (DESYREL) 50 MG tablet Take 50 mg by mouth at bedtime.    Historical Provider, MD     Vital Signs:Blood pressure 109/73, temperature 98.4, heart rate 82, respirations 16, O2 sats 97% room air   Physical Exam patient awake, alert. Chest clear to auscultation bilaterally. Clean, intact right chest wall Port-A-Cath. Heart with regular rate and rhythm. Abdomen soft, positive bowel sounds, nontender. Lower extremities with no edema.  Imaging: No results found.  Labs:  CBC:  Recent Labs  06/04/15 0855 06/18/15 0804 07/11/15 0758 07/23/15 0715  WBC 2.2* 3.2* 9.6 4.9  HGB 12.8 13.0 12.8 13.5  HCT 38.3 39.0 37.8 39.6  PLT 272 273 259 292    COAGS:  Recent Labs  07/23/15 0715  INR 1.15    BMP:  Recent Labs  02/08/15 1158 02/09/15 0540 02/10/15 0410 02/10/15 1202  05/21/15 0812 06/04/15 0855 06/18/15 0805 07/11/15 0758  NA 138 137 140 139  < > 141 141 140 143  K 3.5 3.7 2.9* 3.4*  < > 3.8 3.2* 4.2 4.1  CL 103 104 108 108  --   --   --   --   --   CO2 23 25 27 26  < > 27 24 26 26  GLUCOSE 76 79 92 81  < > 96 122 77 87  BUN 8 <5* <5* <5*  < > 5.4* 5.4* 10.0 8.1  CALCIUM 8.9 8.5* 8.3* 8.4*  < > 9.3 9.0 9.2 9.7  CREATININE 0.51 0.70 0.66 0.77  < > 0.7 0.8 0.7 0.8  GFRNONAA >60 >60 >60 >60  --   --   --   --   --   GFRAA >60 >60 >60 >60  --   --   --   --   --   < > = values in this interval not displayed.  LIVER FUNCTION TESTS:  Recent Labs  05/21/15 0812 06/04/15 0855 06/18/15 0805 07/11/15 0758  BILITOT 0.35 0.51 0.85 0.65  AST 17 15 15 19  ALT 20 21 16 20  ALKPHOS 76 66 63 71  PROT 7.2 6.5 6.8 7.3  ALBUMIN 3.8 3.6 3.7 3.8    Assessment and Plan: Patient with history of Hodgkin's lymphoma, status post treatment.Recent follow-up PET scan has revealed mixed response to therapy with a new hypermetabolic osseous metastasis in the right iliac  bone. She presents today for CT-guided right iliac bone/bone marrow biopsy for further evaluation.Risks and benefits discussed with the patient including, but not limited to bleeding, infection, damage to adjacent structures or low yield requiring additional tests.All of the patient's questions were answered, patient is agreeable to proceed.Consent signed and in chart.     Electronically Signed: D. Kevin  07/23/2015, 8:25 AM   I spent a total of 15 minutes at the the   patient's bedside AND on the patient's hospital floor or unit, greater than 50% of which was counseling/coordinating care for CT-guided right iliac bone/bone marrow biopsy      

## 2015-07-23 NOTE — Sedation Documentation (Signed)
Patient denies pain and is resting comfortably.  

## 2015-07-23 NOTE — Procedures (Signed)
CT guided bone marrow biopsy and targeted biopsy of right iliac bone lesion.  2 aspirates and 2 cores obtained.  No immediate complication.

## 2015-07-23 NOTE — Progress Notes (Signed)
Pt arrived today for her procedure in Radiology. Pt has PAC but did not apply EMLA. I offered to apply ice to site to numb it ( which is typically done when patient forgets EMLA). She refused and asked IV be started peripherally. Uneventful peripheral IV start and blood drawn for labs.

## 2015-07-24 ENCOUNTER — Ambulatory Visit: Payer: Medicaid Other | Admitting: Hematology and Oncology

## 2015-07-25 ENCOUNTER — Telehealth: Payer: Self-pay | Admitting: *Deleted

## 2015-07-25 NOTE — Telephone Encounter (Signed)
I reviewed the bone biopsy result with the patient over the telephone. Bone biopsy is negative for lymphoma. I plan to present her case at the next hematology tumor board on 07/30/2015 to reviewed the case and to determine frequency of follow-up. I will cancel her return appointment tomorrow. I will call the patient next week after final recommendation from the tumor board discussion

## 2015-07-25 NOTE — Telephone Encounter (Signed)
Call from [place] reporting she just missed a call from Dr. Alvy Bimler.  Return number [phone].

## 2015-07-26 ENCOUNTER — Telehealth: Payer: Self-pay | Admitting: *Deleted

## 2015-07-26 ENCOUNTER — Ambulatory Visit: Payer: Medicaid Other | Admitting: Hematology and Oncology

## 2015-07-26 MED ORDER — TRAMADOL HCL 50 MG PO TABS: 50.0000 mg | ORAL_TABLET | Freq: Four times a day (QID) | ORAL | Status: DC | PRN

## 2015-07-26 NOTE — Telephone Encounter (Signed)
OK for refill.

## 2015-07-26 NOTE — Telephone Encounter (Signed)
Pt request refill on Tramadol.

## 2015-07-30 ENCOUNTER — Other Ambulatory Visit: Payer: Self-pay | Admitting: Hematology and Oncology

## 2015-07-30 ENCOUNTER — Telehealth: Payer: Self-pay | Admitting: Hematology and Oncology

## 2015-07-30 DIAGNOSIS — C8174 Other classical Hodgkin lymphoma, lymph nodes of axilla and upper limb: Secondary | ICD-10-CM

## 2015-07-30 NOTE — Telephone Encounter (Signed)
We reviewed her case at that hematology tumor board this morning. The biopsy of the bone lesion is negative. Per NCCN guidelines, I will see her back in 3 months with repeat history and physical examination along with blood work and imaging study in 6 months. I will get her port removed. I reviewed the recommendation with the patient over to telephone today.

## 2015-07-31 ENCOUNTER — Telehealth: Payer: Self-pay | Admitting: Hematology and Oncology

## 2015-07-31 NOTE — Telephone Encounter (Signed)
s.w. pt and advised on JULY appt....pt ok and aware °

## 2015-08-02 LAB — CHROMOSOME ANALYSIS, BONE MARROW

## 2015-08-06 ENCOUNTER — Other Ambulatory Visit: Payer: Self-pay | Admitting: Radiology

## 2015-08-06 ENCOUNTER — Encounter (HOSPITAL_COMMUNITY): Payer: Self-pay

## 2015-08-07 ENCOUNTER — Other Ambulatory Visit: Payer: Self-pay | Admitting: General Surgery

## 2015-08-08 ENCOUNTER — Encounter (HOSPITAL_COMMUNITY): Payer: Self-pay

## 2015-08-08 ENCOUNTER — Ambulatory Visit (HOSPITAL_COMMUNITY)
Admission: RE | Admit: 2015-08-08 | Discharge: 2015-08-08 | Disposition: A | Discharge status: Home / Self Care | Payer: Medicaid Other | Source: Ambulatory Visit | Attending: Hematology and Oncology | Admitting: Hematology and Oncology

## 2015-08-08 DIAGNOSIS — Z452 Encounter for adjustment and management of vascular access device: Secondary | ICD-10-CM | POA: Insufficient documentation

## 2015-08-08 DIAGNOSIS — F17290 Nicotine dependence, other tobacco product, uncomplicated: Secondary | ICD-10-CM | POA: Insufficient documentation

## 2015-08-08 DIAGNOSIS — C8174 Other classical Hodgkin lymphoma, lymph nodes of axilla and upper limb: Secondary | ICD-10-CM | POA: Insufficient documentation

## 2015-08-08 DIAGNOSIS — Z21 Asymptomatic human immunodeficiency virus [HIV] infection status: Secondary | ICD-10-CM | POA: Insufficient documentation

## 2015-08-08 LAB — CBC
HEMATOCRIT: 40 % (ref 36.0–46.0)
HEMOGLOBIN: 13.3 g/dL (ref 12.0–15.0)
MCH: 30.9 pg (ref 26.0–34.0)
MCHC: 33.3 g/dL (ref 30.0–36.0)
MCV: 92.8 fL (ref 78.0–100.0)
Platelets: 243 10*3/uL (ref 150–400)
RBC: 4.31 MIL/uL (ref 3.87–5.11)
RDW: 15.5 % (ref 11.5–15.5)
WBC: 5.6 10*3/uL (ref 4.0–10.5)

## 2015-08-08 LAB — BASIC METABOLIC PANEL
ANION GAP: 9 (ref 5–15)
BUN: 9 mg/dL (ref 6–20)
CHLORIDE: 105 mmol/L (ref 101–111)
CO2: 28 mmol/L (ref 22–32)
Calcium: 9.7 mg/dL (ref 8.9–10.3)
Creatinine, Ser: 0.8 mg/dL (ref 0.44–1.00)
GFR calc Af Amer: >60 mL/min (ref >60)
Glucose, Bld: 95 mg/dL (ref 65–99)
POTASSIUM: 4.1 mmol/L (ref 3.5–5.1)
SODIUM: 142 mmol/L (ref 135–145)

## 2015-08-08 LAB — APTT: aPTT: 31 seconds (ref 24–37)

## 2015-08-08 LAB — PROTIME-INR
INR: 1.05 (ref 0.00–1.49)
Prothrombin Time: 13.9 seconds (ref 11.6–15.2)

## 2015-08-08 MED ORDER — FENTANYL CITRATE (PF) 100 MCG/2ML IJ SOLN
INTRAMUSCULAR | Status: AC
  Filled 2015-08-08: qty 4

## 2015-08-08 MED ORDER — HYDROCODONE-ACETAMINOPHEN 5-325 MG PO TABS: 1.0000 | ORAL_TABLET | ORAL | Status: DC | PRN

## 2015-08-08 MED ORDER — LIDOCAINE HCL 1 % IJ SOLN
INTRAMUSCULAR | Status: AC
  Filled 2015-08-08: qty 20

## 2015-08-08 MED ORDER — SODIUM CHLORIDE 0.9 % IV SOLN
Freq: Once | INTRAVENOUS | Status: AC
  Administered 2015-08-08: 08:00:00 via INTRAVENOUS

## 2015-08-08 MED ORDER — FENTANYL CITRATE (PF) 100 MCG/2ML IJ SOLN
INTRAMUSCULAR | Status: AC | PRN
  Administered 2015-08-08: 50 ug via INTRAVENOUS
  Administered 2015-08-08: 25 ug via INTRAVENOUS

## 2015-08-08 MED ORDER — MIDAZOLAM HCL 2 MG/2ML IJ SOLN
INTRAMUSCULAR | Status: AC
  Filled 2015-08-08: qty 6

## 2015-08-08 MED ORDER — LIDOCAINE HCL 1 % IJ SOLN
INTRAMUSCULAR | Status: AC | PRN
  Administered 2015-08-08: 10 mL via INTRADERMAL

## 2015-08-08 MED ORDER — MIDAZOLAM HCL 2 MG/2ML IJ SOLN
INTRAMUSCULAR | Status: AC | PRN
  Administered 2015-08-08 (×4): 1 mg via INTRAVENOUS

## 2015-08-08 MED ORDER — CEFAZOLIN SODIUM-DEXTROSE 2-4 GM/100ML-% IV SOLN
2.0000 g | INTRAVENOUS | Status: AC
  Administered 2015-08-08: 2 g via INTRAVENOUS
  Filled 2015-08-08: qty 100

## 2015-08-08 NOTE — Sedation Documentation (Signed)
Patient denies pain and is resting comfortably.  

## 2015-08-08 NOTE — Discharge Instructions (Signed)
Incision Care °An incision is when a surgeon cuts into your body. After surgery, the incision needs to be cared for properly to prevent infection.  °HOW TO CARE FOR YOUR INCISION °· Take medicines only as directed by your health care provider. °· There are many different ways to close and cover an incision, including stitches, skin glue, and adhesive strips. Follow your health care provider's instructions on: °¨ Incision care. °¨ Bandage (dressing) changes and removal. °¨ Incision closure removal. °· Do not take baths, swim, or use a hot tub until your health care provider approves. You may shower as directed by your health care provider. °· Resume your normal diet and activities as directed. °· Use anti-itch medicine (such as an antihistamine) as directed by your health care provider. The incision may itch while it is healing. Do not pick or scratch at the incision. °· Drink enough fluid to keep your urine clear or pale yellow. °SEEK MEDICAL CARE IF:  °· You have drainage, redness, swelling, or pain at your incision site. °· You have muscle aches, chills, or a general ill feeling. °· You notice a bad smell coming from the incision or dressing. °· Your incision edges separate after the sutures, staples, or skin adhesive strips have been removed. °· You have persistent nausea or vomiting. °· You have a fever. °· You are dizzy. °SEEK IMMEDIATE MEDICAL CARE IF:  °· You have a rash. °· You faint. °· You have difficulty breathing. °MAKE SURE YOU:  °· Understand these instructions. °· Will watch your condition. °· Will get help right away if you are not doing well or get worse. °  °This information is not intended to replace advice given to you by your health care provider. Make sure you discuss any questions you have with your health care provider. °  °Document Released: 10/31/2004 Document Revised: 05/04/2014 Document Reviewed: 06/07/2013 °Elsevier Interactive Patient Education ©2016 Elsevier Inc[employer] °Moderate Conscious  Sedation, Adult °Sedation is the use of medicines to promote relaxation and relieve discomfort and anxiety. Moderate conscious sedation is a type of sedation. Under moderate conscious sedation you are less alert than normal but are still able to respond to instructions or stimulation. Moderate conscious sedation is used during short medical and dental procedures. It is milder than deep sedation or general anesthesia and allows you to return to your regular activities sooner. °LET YOUR HEALTH CARE PROVIDER KNOW ABOUT:  °· Any allergies you have. °· All medicines you are taking, including vitamins, herbs, eye drops, creams, and over-the-counter medicines. °· Use of steroids (by mouth or creams). °· Previous problems you or members of your family have had with the use of anesthetics. °· Any blood disorders you have. °· Previous surgeries you have had. °· Medical conditions you have. °· Possibility of pregnancy, if this applies. °· Use of cigarettes, alcohol, or illegal drugs. °RISKS AND COMPLICATIONS °Generally, this is a safe procedure. However, as with any procedure, problems can occur. Possible problems include: °· Oversedation. °· Trouble breathing on your own. You may need to have a breathing tube until you are awake and breathing on your own. °· Allergic reaction to any of the medicines used for the procedure. °BEFORE THE PROCEDURE °· You may have blood tests done. These tests can help show how well your kidneys and liver are working. They can also show how well your blood clots. °· A physical exam will be done.   °· Only take medicines as directed by your health care provider. You may need   to stop taking medicines (such as blood thinners, aspirin, or nonsteroidal anti-inflammatory drugs) before the procedure.   °· Do not eat or drink at least 6 hours before the procedure or as directed by your health care provider. °· Arrange for a responsible adult, family member, or friend to take you home after the procedure.  He or she should stay with you for at least 24 hours after the procedure, until the medicine has worn off. °PROCEDURE  °· An intravenous (IV) catheter will be inserted into one of your veins. Medicine will be able to flow directly into your body through this catheter. You may be given medicine through this tube to help prevent pain and help you relax. °· The medical or dental procedure will be done. °AFTER THE PROCEDURE °· You will stay in a recovery area until the medicine has worn off. Your blood pressure and pulse will be checked.   °·  Depending on the procedure you had, you may be allowed to go home when you can tolerate liquids and your pain is under control. °  °This information is not intended to replace advice given to you by your health care provider. Make sure you discuss any questions you have with your health care provider. °  °Document Released: 01/06/2001 Document Revised: 05/04/2014 Document Reviewed: 12/19/2012 °Elsevier Interactive Patient Education ©2016 Elsevier Inc. ° °

## 2015-08-08 NOTE — Procedures (Signed)
Successful removal of right chest portacath. No immediate complication.  Minimal blood loss.

## 2015-08-08 NOTE — H&P (Signed)
Chief Complaint: hodgkin's lymphoma, needs PAC removed  Referring Physician:Dr. Heath Lark  Supervising Physician: Markus Daft  HPI: [name] is an 30 y.o. female with a history of HIV and Hodgkin's Lymphoma.  She has been treated for over a year with chemotherapy.  She was found to have a possible lytic lesion on bone and a biopsy was done of that about 3 weeks ago.  This revealed a negative pathology.  Because of this, the patient was no longer felt to need further treatment.  She has presented today for removal of her port a cath.  Past Medical History:  Past Medical History  Diagnosis Date  . HIV (human immunodeficiency virus infection) ([place])   . Depression   . Anemia   . HSV (herpes simplex virus) infection   . History of shingles   . AIN III (anal intraepithelial neoplasia III)   . Condyloma acuminatum in female   . History of chronic bronchitis   . History of esophagitis     [place]AND[place]  . Periodontitis, chronic   . Hodgkin's lymphoma ([place]) 06/12/2014  . Cancer ([place])     Hodgkin lymphoma  . Hypokalemia 07/17/2014  . Screening examination for venereal disease 10/30/2013  . Chest wall pain 06/27/2015    Past Surgical History:  Past Surgical History  Procedure Laterality Date  . Dilation and curettage of uterus  2005    MISSED AB  . Examination under anesthesia N/A 09/23/2012    Procedure: EXAM UNDER ANESTHESIA;  Surgeon: Adin Hector, MD;  Location: [facility];  Service: General;  Laterality: N/A;  . Laser ablation condolamata N/A 09/23/2012    Procedure: REMOVAL/ABLATION  ABLATION CONDOLAMATA WARTS;  Surgeon: Adin Hector, MD;  Location: [facility];  Service: General;  Laterality: N/A;    Family History:  Family History  Problem Relation Age of Onset  . Cancer Maternal Aunt     unknown ca  . Cancer Maternal Grandmother     unknown ca    Social History:  reports that she has been smoking Cigars.  She started smoking about 16  months ago. She has never used smokeless tobacco. She reports that she uses illicit drugs (Marijuana). She reports that she does not drink alcohol.  Allergies: No Known Allergies  Medications:   Medication List    ASK your doctor about these medications        atazanavir 300 MG capsule  Commonly known as:  REYATAZ  TAKE ONE CAPSULE BY MOUTH DAILY WITH BREAKFAST. TAKE WITH NORVIR     cephALEXin 500 MG capsule  Commonly known as:  KEFLEX  Take 1 capsule (500 mg total) by mouth 4 (four) times daily.     CLEAR EYES OP  Apply 2 drops to eye 3 (three) times daily as needed (redness).     emtricitabine-tenofovir 200-300 MG tablet  Commonly known as:  TRUVADA  Take 1 tablet by mouth daily.     hydrocortisone cream 0.5 %  Apply 1 application topically 2 (two) times daily.     ibuprofen 200 MG tablet  Commonly known as:  ADVIL,MOTRIN  Take 2 tablets (400 mg total) by mouth every 6 (six) hours as needed for moderate pain.     ketoconazole 2 % cream  Commonly known as:  NIZORAL  Apply 1 application topically 2 (two) times daily as needed for irritation.     methocarbamol 500 MG tablet  Commonly known as:  ROBAXIN  Take  1 tablet (500 mg total) by mouth 2 (two) times daily.     mupirocin cream 2 %  Commonly known as:  BACTROBAN  Apply 1 application topically 3 (three) times daily. For 5 days.     ondansetron 4 MG tablet  Commonly known as:  ZOFRAN  Take 1 tablet (4 mg total) by mouth every 6 (six) hours.     ondansetron 8 MG disintegrating tablet  Commonly known as:  ZOFRAN ODT  Take 1 tablet (8 mg total) by mouth every 8 (eight) hours as needed for nausea or vomiting.     potassium chloride SA 20 MEQ tablet  Commonly known as:  K-DUR,KLOR-CON  Take 1 tablet (20 mEq total) by mouth 2 (two) times daily.     ritonavir 100 MG Tabs tablet  Commonly known as:  NORVIR  Take 1 tablet (100 mg total) by mouth daily with breakfast. TAKE WITH REYATAZ     traMADol 50 MG tablet    Commonly known as:  ULTRAM  Take 1 tablet (50 mg total) by mouth every 6 (six) hours as needed.     traZODone 50 MG tablet  Commonly known as:  DESYREL  Take 50 mg by mouth at bedtime.     WELLBUTRIN XL 150 MG 24 hr tablet  Generic drug:  buPROPion  Take 150 mg by mouth at bedtime.        Please HPI for pertinent positives, otherwise complete 10 system ROS negative.  Mallampati Score: MD Evaluation Airway: WNL Heart: WNL Abdomen: WNL Chest/ Lungs: WNL ASA  Classification: 3 Mallampati/Airway Score: One  Physical Exam: BP 118/65 mmHg  Pulse 75  Temp(Src) 97.8 F (36.6 C) (Oral)  Resp 16  SpO2 96% There is no weight on file to calculate BMI. General: pleasant, skinny black female who is laying in bed in NAD HEENT: head is normocephalic, atraumatic.  Sclera are noninjected.  PERRL.  Ears and nose without any masses or lesions.  Mouth is pink and moist Heart: regular, rate, and rhythm.  Normal s1,s2. No obvious murmurs, gallops, or rubs noted.  Palpable radial and pedal pulses bilaterally Lungs: CTAB, no wheezes, rhonchi, or rales noted.  Respiratory effort nonlabored Abd: soft, NT, ND, +BS, no masses, hernias, or organomegaly MS: all 4 extremities are symmetrical with no cyanosis, clubbing, or edema. Psych: A&Ox3 with an appropriate affect.   Labs: Results for orders placed or performed during the hospital encounter of 08/08/15 (from the past 48 hour(s))  APTT     Status: None   Collection Time: 08/08/15  7:50 AM  Result Value Ref Range   aPTT 31 24 - 37 seconds  CBC     Status: None   Collection Time: 08/08/15  7:50 AM  Result Value Ref Range   WBC 5.6 4.0 - 10.5 K/uL   RBC 4.31 3.87 - 5.11 MIL/uL   Hemoglobin 13.3 12.0 - 15.0 g/dL   HCT 40.0 36.0 - 46.0 %   MCV 92.8 78.0 - 100.0 fL   MCH 30.9 26.0 - 34.0 pg   MCHC 33.3 30.0 - 36.0 g/dL   RDW 15.5 11.5 - 15.5 %   Platelets 243 150 - 400 K/uL  Protime-INR     Status: None   Collection Time: 08/08/15  7:50 AM   Result Value Ref Range   Prothrombin Time 13.9 11.6 - 15.2 seconds   INR 1.05 0.00 - 1.49    Imaging: No results found.  Assessment/Plan 1. Hodgkin's Lymphoma -the patient has  completed treatment at this time.  She will get her PAC removed today as all of her vitals and labs are normal. -Risks and Benefits discussed with the patient including, but not limited to bleeding, infection. All of the patient's questions were answered, patient is agreeable to proceed. Consent signed and in chart.   Thank you for this interesting consult.  I greatly enjoyed meeting [name] and look forward to participating in their care.  A copy of this report was sent to the requesting provider on this date.  Electronically Signed: Henreitta Cea 08/08/2015, 8:45 AM   I spent a total of    25 Minutes in face to face in clinical consultation, greater than 50% of which was counseling/coordinating care for hodgkins lymphoma, needs PAC removed

## 2015-08-11 ENCOUNTER — Other Ambulatory Visit: Payer: Self-pay | Admitting: Internal Medicine

## 2015-08-12 ENCOUNTER — Telehealth: Payer: Self-pay | Admitting: *Deleted

## 2015-08-12 MED ORDER — TRAMADOL HCL 50 MG PO TABS: 50.0000 mg | ORAL_TABLET | Freq: Four times a day (QID) | ORAL | Status: DC | PRN

## 2015-08-12 NOTE — Telephone Encounter (Signed)
Pt left VM requesting refill on Tramadol.  Dr. Alvy Bimler agrees to one more refill and then pt needs to have PCP manage her pain.  Unsure why pt still having pain when her Port a cath has been removed.  LVM for pt informing of refill called into [facility] on [address] and this is last refill from Dr. Alvy Bimler.  Please have her PCP manage her pain now.

## 2015-09-17 ENCOUNTER — Other Ambulatory Visit: Payer: Self-pay | Admitting: *Deleted

## 2015-09-17 ENCOUNTER — Other Ambulatory Visit: Payer: Self-pay | Admitting: Internal Medicine

## 2015-09-17 ENCOUNTER — Other Ambulatory Visit: Payer: Self-pay | Admitting: Hematology and Oncology

## 2015-09-17 ENCOUNTER — Telehealth: Payer: Self-pay | Admitting: *Deleted

## 2015-09-17 DIAGNOSIS — IMO0001 Reserved for inherently not codable concepts without codable children: Secondary | ICD-10-CM

## 2015-09-17 MED ORDER — TRAMADOL HCL 50 MG PO TABS: 50.0000 mg | ORAL_TABLET | Freq: Four times a day (QID) | ORAL | Status: DC | PRN

## 2015-09-17 NOTE — Telephone Encounter (Signed)
Patient called asking if Dr. Linus Salmons would take over the ultram from Dr. Alvy Bimler.  Please advise.  Pt states that this helps her sleep. Landis Gandy, RN

## 2015-09-17 NOTE — Telephone Encounter (Signed)
Reordered. Thank you.

## 2015-09-17 NOTE — Telephone Encounter (Signed)
Ok i will continue it.  thanks

## 2015-10-01 ENCOUNTER — Encounter (HOSPITAL_COMMUNITY): Payer: Self-pay

## 2015-10-01 ENCOUNTER — Emergency Department (HOSPITAL_COMMUNITY)
Admission: EM | Admit: 2015-10-01 | Discharge: 2015-10-01 | Disposition: A | Discharge status: Home / Self Care | Payer: Medicaid Other | Attending: Emergency Medicine | Admitting: Emergency Medicine

## 2015-10-01 ENCOUNTER — Telehealth: Payer: Self-pay | Admitting: *Deleted

## 2015-10-01 DIAGNOSIS — Y939 Activity, unspecified: Secondary | ICD-10-CM | POA: Diagnosis not present

## 2015-10-01 DIAGNOSIS — F329 Major depressive disorder, single episode, unspecified: Secondary | ICD-10-CM | POA: Diagnosis not present

## 2015-10-01 DIAGNOSIS — C8190 Hodgkin lymphoma, unspecified, unspecified site: Secondary | ICD-10-CM | POA: Insufficient documentation

## 2015-10-01 DIAGNOSIS — F17210 Nicotine dependence, cigarettes, uncomplicated: Secondary | ICD-10-CM | POA: Diagnosis not present

## 2015-10-01 DIAGNOSIS — B20 Human immunodeficiency virus [HIV] disease: Secondary | ICD-10-CM

## 2015-10-01 DIAGNOSIS — T719XXA Asphyxiation due to unspecified cause, initial encounter: Secondary | ICD-10-CM | POA: Insufficient documentation

## 2015-10-01 DIAGNOSIS — Y929 Unspecified place or not applicable: Secondary | ICD-10-CM | POA: Diagnosis not present

## 2015-10-01 DIAGNOSIS — Z79891 Long term (current) use of opiate analgesic: Secondary | ICD-10-CM | POA: Insufficient documentation

## 2015-10-01 DIAGNOSIS — R1310 Dysphagia, unspecified: Secondary | ICD-10-CM | POA: Diagnosis present

## 2015-10-01 DIAGNOSIS — Y999 Unspecified external cause status: Secondary | ICD-10-CM | POA: Insufficient documentation

## 2015-10-01 DIAGNOSIS — T71193A Asphyxiation due to mechanical threat to breathing due to other causes, assault, initial encounter: Secondary | ICD-10-CM

## 2015-10-01 MED ORDER — HYDROCODONE-ACETAMINOPHEN 5-325 MG PO TABS
1.0000 | ORAL_TABLET | ORAL | Status: AC
  Administered 2015-10-01: 1 via ORAL
  Filled 2015-10-01: qty 1

## 2015-10-01 MED ORDER — NAPROXEN 500 MG PO TABS
500.0000 mg | ORAL_TABLET | Freq: Once | ORAL | Status: AC
  Administered 2015-10-01: 500 mg via ORAL
  Filled 2015-10-01: qty 1

## 2015-10-01 NOTE — Discharge Instructions (Signed)
Soft Tissue Injury of the Neck A soft tissue injury of the neck may be either blunt or penetrating. A blunt injury does not break the skin. A penetrating injury breaks the skin, creating an open wound. Blunt injuries may happen in several ways. Most involve some type of direct blow to the neck. This can cause serious injury to the windpipe, voice box, cervical spine, or esophagus. In some cases, the injury to the soft tissue can also result in a break (fracture) of the cervical spine.  Soft tissue injuries of the neck require immediate medical care. Sometimes, you may not notice the signs of injury right away. You may feel fine at first, but the swelling may eventually close off your airway. This could result in a significant or life-threatening injury. This is rare, but it is important to keep in mind with any injury to the neck.  CAUSES  Causes of blunt injury may include:  "Clothesline" injuries. This happens when someone is moving at high speed and runs into a clothesline, outstretched arm, or similar object. This results in a direct injury to the front of the neck. If the airway is blocked, it can cause suffocation due to lack of oxygen (asphyxiation) or even instant death.  High-energy trauma. This includes injuries from motor vehicle crashes, falling from a great height, or heavy objects falling onto the neck.  Sports-related injuries. Injury to the windpipe and voice box can result from being struck by another player or being struck by an object, such as a baseball, hockey stick, or an outstretched arm.  Strangulation. This type of injury may cause skin trauma, hoarseness of voice, or broken cartilage in the voice box or windpipe. It may also cause a serious airway problem. SYMPTOMS   Bruising.  Pain and tenderness in the neck.  Swelling of the neck and face.  Hoarseness of voice.  Pain or difficulty with swallowing.  Drooling or inability to swallow.  Trouble breathing. This may  become worse when lying flat.  Coughing up blood.  High-pitched, harsh, vibratory noise due to partial obstruction of the windpipe (stridor).  Swelling of the upper arms.  Windpipe that appears to be pushed off to one side.  Air in the tissues under the skin of the neck or chest (subcutaneous emphysema). This usually indicates a problem with the normal airway and is a medical emergency. DIAGNOSIS   If possible, your caregiver may ask about the details of how the injury occurred. A detailed exam can help to identify specific areas of the neck that are injured.  Your caregiver may ask for tests to rule out injury of the voice box, airway, or esophagus. This may include X-rays, ultrasounds, CT scans, or MRI scans, depending on the severity of your injury. TREATMENT  If you have an injury to your windpipe or voice box, immediate medical care is required. In almost all cases, hospitalization is necessary. For injuries that do not appear to require surgery, it is helpful to have medical observation for 24 hours. You may be asked to do one or more of the following:  Rest your voice.  Bed rest.  Limit your diet, depending on the extent of the injury. Follow your caregiver's dietary guidelines. Often, only fluids and soft foods are recommended.  Keep your head raised.  Breathe humidified air.  Take medicines to control infection, reduce swelling, and reduce normal stomach acid. You may also need pain medicine, depending on your injury. For injuries that appear to require surgery,  you will need to stay in the hospital. The exact type of procedure needed will depend on your exact injury or injuries.  HOME CARE INSTRUCTIONS   If the skin was broken, keep the wound area clean and dry. Wear your bandage (dressing) and care for your wound as instructed.  Follow your caregiver's advice about your diet.  Follow your caregiver's advice about use of your voice.  Take medicines as  directed.  Keep your head and neck at least partially raised (elevated) while recovering. This should also be done while sleeping. SEEK MEDICAL CARE IF:   Your voice becomes weaker.  Your swelling or bruising is not improving as expected. Typically, this takes several days to improve.  You feel that you are having problems with medicines prescribed.  You have drainage from the injury site. This may be a sign that your wound is not healing properly or is infected.  You develop increasing pain or difficulty while swallowing.  You develop an oral temperature of 102 F (38.9 C) or higher. SEEK IMMEDIATE MEDICAL CARE IF:   You cough up blood.  You develop sudden trouble breathing.  You cannot tolerate your oral medicines, or you are unable to swallow.  You develop drooling.  You have new or worsening vomiting.  You develop sudden, new swelling of the neck or face.  You have an oral temperature above 102 F (38.9 C), not controlled by medicine. MAKE SURE YOU:  Understand these instructions.  Will watch your condition.  Will get help right away if you are not doing well or get worse.   This information is not intended to replace advice given to you by your health care provider. Make sure you discuss any questions you have with your health care provider.   Document Released: 07/21/2007 Document Revised: 07/06/2011 Document Reviewed: 10/31/2014 Elsevier Interactive Patient Education [employer].
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
## 2015-10-01 NOTE — ED Notes (Signed)
[facility] AND [facility] ARRIVED WITH PT AND REPORT FILED

## 2015-10-01 NOTE — ED Notes (Signed)
MD at bedside. 
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
## 2015-10-01 NOTE — Telephone Encounter (Signed)
Referral to A. Leonides Schanz
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
## 2015-10-30 ENCOUNTER — Telehealth: Payer: Self-pay | Admitting: Hematology and Oncology

## 2015-10-30 NOTE — Telephone Encounter (Signed)
pt called to r/s appt...done....pt ok and aware of new d.t °

## 2015-10-31 ENCOUNTER — Other Ambulatory Visit: Payer: Medicaid Other

## 2015-10-31 ENCOUNTER — Ambulatory Visit: Payer: Medicaid Other | Admitting: Hematology and Oncology

## 2015-11-01 ENCOUNTER — Telehealth: Payer: Self-pay | Admitting: *Deleted

## 2015-11-01 ENCOUNTER — Other Ambulatory Visit: Payer: Self-pay | Admitting: Internal Medicine

## 2015-11-01 NOTE — Telephone Encounter (Signed)
Pt notified that Dr Alvy Bimler will write prescription refill when she sees her on Monday. Can use ibuprofen over weekend if she runs out of tramadol.Pt verbalized understanding

## 2015-11-01 NOTE — Telephone Encounter (Signed)
Pt called requesting a refill of tramadol. States she is taking 5-6 tablets per day. (Instructions are for 1 tablet every 6 hours) Takes 2 tablets at night to help sleep, states it helps her wrist pain.

## 2015-11-04 ENCOUNTER — Encounter: Payer: Self-pay | Admitting: Hematology and Oncology

## 2015-11-04 ENCOUNTER — Ambulatory Visit (HOSPITAL_BASED_OUTPATIENT_CLINIC_OR_DEPARTMENT_OTHER): Payer: Medicaid Other | Admitting: Hematology and Oncology

## 2015-11-04 ENCOUNTER — Telehealth: Payer: Self-pay | Admitting: Hematology and Oncology

## 2015-11-04 ENCOUNTER — Other Ambulatory Visit (HOSPITAL_BASED_OUTPATIENT_CLINIC_OR_DEPARTMENT_OTHER): Payer: Medicaid Other

## 2015-11-04 VITALS — BP 96/55 | HR 66 | Temp 99.3°F | Resp 17 | Wt 106.8 lb

## 2015-11-04 DIAGNOSIS — M25531 Pain in right wrist: Secondary | ICD-10-CM

## 2015-11-04 DIAGNOSIS — G8929 Other chronic pain: Secondary | ICD-10-CM | POA: Insufficient documentation

## 2015-11-04 DIAGNOSIS — Z720 Tobacco use: Secondary | ICD-10-CM

## 2015-11-04 DIAGNOSIS — C8174 Other classical Hodgkin lymphoma, lymph nodes of axilla and upper limb: Secondary | ICD-10-CM

## 2015-11-04 DIAGNOSIS — C8190 Hodgkin lymphoma, unspecified, unspecified site: Secondary | ICD-10-CM

## 2015-11-04 DIAGNOSIS — B20 Human immunodeficiency virus [HIV] disease: Secondary | ICD-10-CM

## 2015-11-04 LAB — LACTATE DEHYDROGENASE: LDH: 156 U/L (ref 125–245)

## 2015-11-04 LAB — CBC WITH DIFFERENTIAL/PLATELET
BASO%: 0.1 % (ref 0.0–2.0)
BASOS ABS: 0 10*3/uL (ref 0.0–0.1)
EOS%: 0.9 % (ref 0.0–7.0)
Eosinophils Absolute: 0.1 10*3/uL (ref 0.0–0.5)
HEMATOCRIT: 38.5 % (ref 34.8–46.6)
HEMOGLOBIN: 13.7 g/dL (ref 11.6–15.9)
LYMPH#: 1.6 10*3/uL (ref 0.9–3.3)
LYMPH%: 23.5 % (ref 14.0–49.7)
MCH: 32.5 pg (ref 25.1–34.0)
MCHC: 35.6 g/dL (ref 31.5–36.0)
MCV: 91.4 fL (ref 79.5–101.0)
MONO#: 0.5 10*3/uL (ref 0.1–0.9)
MONO%: 7 % (ref 0.0–14.0)
NEUT#: 4.8 10*3/uL (ref 1.5–6.5)
NEUT%: 68.5 % (ref 38.4–76.8)
Platelets: 248 10*3/uL (ref 145–400)
RBC: 4.21 10*6/uL (ref 3.70–5.45)
RDW: 14.7 % — AB (ref 11.2–14.5)
WBC: 7 10*3/uL (ref 3.9–10.3)

## 2015-11-04 LAB — COMPREHENSIVE METABOLIC PANEL
ALBUMIN: 3.7 g/dL (ref 3.5–5.0)
ALK PHOS: 80 U/L (ref 40–150)
ALT: 17 U/L (ref 0–55)
AST: 14 U/L (ref 5–34)
Anion Gap: 8 mEq/L (ref 3–11)
BUN: 6.9 mg/dL — ABNORMAL LOW (ref 7.0–26.0)
CO2: 25 mEq/L (ref 22–29)
Calcium: 9.4 mg/dL (ref 8.4–10.4)
Chloride: 104 mEq/L (ref 98–109)
Creatinine: 0.8 mg/dL (ref 0.6–1.1)
EGFR: >90 mL/min/{1.73_m2} (ref >90)
Glucose: 85 mg/dl (ref 70–140)
Potassium: 3.9 mEq/L (ref 3.5–5.1)
SODIUM: 138 meq/L (ref 136–145)
TOTAL PROTEIN: 7.2 g/dL (ref 6.4–8.3)
Total Bilirubin: 1.02 mg/dL (ref 0.20–1.20)

## 2015-11-04 NOTE — Assessment & Plan Note (Signed)
She has chronic wrist pain of unknown etiology. Examination is benign. I recommend vitamin D supplement. I do not recommend long-term tramadol and I declined refilling her prescription today

## 2015-11-04 NOTE — Assessment & Plan Note (Signed)
I spent some time counseling the patient the importance of tobacco cessation. she is currently attempting to quit on her own 

## 2015-11-04 NOTE — Telephone Encounter (Signed)
Gave patient avs report and appointments for October. Central radiology scheduling will call patient with ct scan - patient aware.

## 2015-11-04 NOTE — Assessment & Plan Note (Signed)
She had completed recent treatment. Examination is benign. I have ordered a CT scan of the chest, abdomen and pelvis along with blood work to be done in 3 months to exclude cancer recurrence

## 2015-11-04 NOTE — Progress Notes (Signed)
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   05/07/2014 Imaging CT scan of the chest show right thoracic inlet lymphadenopathy   06/07/2014 Procedure She underwent ultrasound-guided core biopsy of the neck lymph node   06/07/2014 Pathology Results Accession: [id number] biopsy confirmed diagnosis of Hodgkin lymphoma.   06/15/2014 Imaging Echocardiogram showed preserved ejection fraction   07/09/2014 - 07/12/2014 Hospital Admission She was admitted to the hospital for severe anemia.   07/27/2014 Procedure She had placement of port   07/31/2014 - 09/11/2014 Chemotherapy She received dose adjusted chemotherapy due to abnormal liver function tests and severe anemia. Treatment was delayed due to noncompliance  and subsequently stopped because the patient failed to keep appointments   01/11/2015 Imaging Repeat PET CT scan showed response to treatment   01/28/2015 - 06/18/2015 Chemotherapy ABVD was restarted with full dose.   02/08/2015 - 02/10/2015 Hospital Admission The patient was admitted to the hospital due to pancytopenia and profuse diarrhea. Cultures were negative. She was placed on ciprofloxacin.   02/11/2015 Adverse Reaction Treatment was placed on hold due to recent infection.   04/11/2015 Imaging PET CT scan showed near complete response. Incidental finding of an abnormal bone lesion, indeterminate. She is not symptomatic. Recommendation from [facility] to observe   07/11/2015 Imaging PET CT scan showed abnormal new bone lesions, suggestive of possible disease progression   07/23/2015 Bone Marrow Biopsy She  underwent bone biopsy   07/23/2015 Pathology Results Accession: [id number]  biopsy was negative for cancer    INTERVAL HISTORY: Please see below for problem oriented charting. She is seen as part of her follow-up. Denies recent infection. She has intermittent wrist pain. She has gained some weight since I last saw her. No new lymphadenopathy. She has regular menstruation since discontinuation of chemotherapy. She continues to smoke  REVIEW OF SYSTEMS:   Constitutional: Denies fevers, chills or abnormal weight loss Eyes: Denies blurriness of vision Ears, nose, mouth, throat, and face: Denies mucositis or sore throat Respiratory: Denies cough, dyspnea or wheezes Cardiovascular: Denies palpitation, chest discomfort or lower extremity swelling Gastrointestinal:  Denies nausea, heartburn or change in bowel habits Skin: Denies abnormal skin rashes Lymphatics: Denies new lymphadenopathy or easy bruising Neurological:Denies numbness, tingling or new weaknesses Behavioral/Psych: Mood is stable, no new changes  All other systems were reviewed with the patient and are negative.  I have reviewed the past medical history, past surgical history, social history and family history with the patient and they are unchanged from previous note.  ALLERGIES:  has No Known Allergies.  MEDICATIONS:  Current Outpatient Prescriptions  Medication Sig Dispense Refill  . buPROPion (WELLBUTRIN XL) 150 MG 24 hr tablet Take 150 mg by mouth at bedtime.     . NORVIR 100 MG TABS tablet TAKE 1 TABLET BY MOUTH DAILY WITH BREAKFAST. TAKE WITH REYATAZ 30 tablet 5  . REYATAZ 300 MG capsule TAKE 1 CAPSULE BY MOUTH DAILY WITH BREAKFAST. TAKE WITH NORVIR 30 capsule 5  . traMADol (ULTRAM) 50 MG tablet TAKE 1 TABLET BY MOUTH EVERY 6 HOURS AS NEEDED 90 tablet 0  . TRUVADA 200-300 MG tablet TAKE 1 TABLET  BY MOUTH EVERY DAY 30 tablet 5   No current facility-administered medications for this visit.    PHYSICAL  EXAMINATION: ECOG PERFORMANCE STATUS: 0 - Asymptomatic  Filed Vitals:   11/04/15 1313  BP: 96/55  Pulse: 66  Temp: 99.3 F (37.4 C)  Resp: 17   Filed Weights   11/04/15 1313  Weight: 106 lb 12.8 oz (48.444 kg)    GENERAL:alert, no distress and comfortable SKIN: skin color, texture, turgor are normal, no rashes or significant lesions EYES: normal, Conjunctiva are pink and non-injected, sclera clear OROPHARYNX:no exudate, no erythema and lips, buccal mucosa, and tongue normal  NECK: supple, thyroid normal size, non-tender, without nodularity LYMPH:  no palpable lymphadenopathy in the cervical, axillary or inguinal LUNGS: clear to auscultation and percussion with normal breathing effort HEART: regular rate & rhythm and no murmurs and no lower extremity edema ABDOMEN:abdomen soft, non-tender and normal bowel sounds Musculoskeletal:no cyanosis of digits and no clubbing  NEURO: alert & oriented x 3 with fluent speech, no focal motor/sensory deficits  LABORATORY DATA:  I have reviewed the data as listed    Component Value Date/Time   NA 138 11/04/2015 1300   NA 142 08/08/2015 0750   K 3.9 11/04/2015 1300   K 4.1 08/08/2015 0750   CL 105 08/08/2015 0750   CO2 25 11/04/2015 1300   CO2 28 08/08/2015 0750   GLUCOSE 85 11/04/2015 1300   GLUCOSE 95 08/08/2015 0750   BUN 6.9* 11/04/2015 1300   BUN 9 08/08/2015 0750   CREATININE 0.8 11/04/2015 1300   CREATININE 0.80 08/08/2015 0750   CREATININE 0.66 12/04/2014 1014   CALCIUM 9.4 11/04/2015 1300   CALCIUM 9.7 08/08/2015 0750   PROT 7.2 11/04/2015 1300   PROT 7.1 02/08/2015 1158   ALBUMIN 3.7 11/04/2015 1300   ALBUMIN 3.9 02/08/2015 1158   AST 14 11/04/2015 1300   AST 25 02/08/2015 1158   ALT 17 11/04/2015 1300   ALT 22 02/08/2015 1158   ALKPHOS 80 11/04/2015 1300   ALKPHOS 66 02/08/2015 1158   BILITOT 1.02 11/04/2015 1300   BILITOT 0.5 02/08/2015 1158   GFRNONAA >60 08/08/2015 0750   GFRNONAA >89 12/04/2014 1014   GFRAA  >60 08/08/2015 0750   GFRAA >89 12/04/2014 1014    No results found for: SPEP, UPEP  Lab Results  Component Value Date   WBC 7.0 11/04/2015   NEUTROABS 4.8 11/04/2015   HGB 13.7 11/04/2015   HCT 38.5 11/04/2015   MCV 91.4 11/04/2015   PLT 248 11/04/2015      Chemistry      Component Value Date/Time   NA 138 11/04/2015 1300   NA 142 08/08/2015 0750   K 3.9 11/04/2015 1300   K 4.1 08/08/2015 0750   CL 105 08/08/2015 0750   CO2 25 11/04/2015 1300   CO2 28 08/08/2015 0750   BUN 6.9* 11/04/2015 1300   BUN 9 08/08/2015 0750   CREATININE 0.8 11/04/2015 1300   CREATININE 0.80 08/08/2015 0750   CREATININE 0.66 12/04/2014 1014      Component Value Date/Time   CALCIUM 9.4 11/04/2015 1300   CALCIUM 9.7 08/08/2015 0750   ALKPHOS 80 11/04/2015 1300   ALKPHOS 66 02/08/2015 1158   AST 14 11/04/2015 1300   AST 25 02/08/2015 1158   ALT 17 11/04/2015 1300   ALT 22 02/08/2015 1158   BILITOT 1.02 11/04/2015 1300   BILITOT 0.5 02/08/2015 1158      ASSESSMENT & PLAN:  Other classical hodgkin lymphoma,  lymph nodes of axilla and upper limb She had completed recent treatment. Examination is benign. I have ordered a CT scan of the chest, abdomen and pelvis along with blood work to be done in 3 months to exclude cancer recurrence     Human immunodeficiency virus (HIV) disease ([place]) She continues taking anti-retroviral treatment. Again, I reinforced the importance of her keeping her appointments with infectious disease.   Chronic pain of right wrist She has chronic wrist pain of unknown etiology. Examination is benign. I recommend vitamin D supplement. I do not recommend long-term tramadol and I declined refilling her prescription today  Tobacco abuse I spent some time counseling the patient the importance of tobacco cessation. she is currently attempting to quit on her own       Orders Placed This Encounter  Procedures  . CT CHEST W CONTRAST    Standing Status:  Future     Number of Occurrences:      Standing Expiration Date: 02/03/2017    Order Specific Question:  Reason for Exam (SYMPTOM  OR DIAGNOSIS REQUIRED)    Answer:  staging hodgkin lymphoma, exclude recurrence    Order Specific Question:  Is the patient pregnant?    Answer:  No    Order Specific Question:  Preferred imaging location?    Answer:  [facility]  . CT ABDOMEN PELVIS W CONTRAST    Standing Status: Future     Number of Occurrences:      Standing Expiration Date: 02/03/2017    Order Specific Question:  Reason for Exam (SYMPTOM  OR DIAGNOSIS REQUIRED)    Answer:  staging hodgkin lymphoma, exclude recurrence    Order Specific Question:  Is the patient pregnant?    Answer:  No    Order Specific Question:  Preferred imaging location?    Answer:  [facility]  . CBC with Differential/Platelet    Standing Status: Future     Number of Occurrences:      Standing Expiration Date: 02/03/2017  . Comprehensive metabolic panel    Standing Status: Future     Number of Occurrences:      Standing Expiration Date: 02/03/2017  . Pregnancy, urine    Standing Status: Future     Number of Occurrences:      Standing Expiration Date: 11/03/2016   All questions were answered. The patient knows to call the clinic with any problems, questions or concerns. No barriers to learning was detected. I spent 15 minutes counseling the patient face to face. The total time spent in the appointment was 20 minutes and more than 50% was on counseling and review of test results     [facility], [place], MD 11/04/2015 1:54 PM

## 2015-11-04 NOTE — Assessment & Plan Note (Signed)
She continues taking anti-retroviral treatment. Again, I reinforced the importance of her keeping her appointments with infectious disease.

## 2015-11-07 ENCOUNTER — Telehealth: Payer: Self-pay | Admitting: *Deleted

## 2015-11-07 NOTE — Telephone Encounter (Signed)
RN contacted the patient to offer [place] Nurse service. RN did not receive a answer so message was left stating my name and that I was calling from one of her Dr's office. RN also stated we have not seen you in a while and would like to schedule a office visit with you. Left my number for the patient to return my call

## 2015-11-17 ENCOUNTER — Other Ambulatory Visit: Payer: Self-pay | Admitting: Obstetrics and Gynecology

## 2015-11-17 ENCOUNTER — Emergency Department (HOSPITAL_COMMUNITY)
Admission: EM | Admit: 2015-11-17 | Discharge: 2015-11-17 | Discharge status: Against Medical Advice | Payer: Medicaid Other | Source: Home / Self Care | Attending: Emergency Medicine | Admitting: Emergency Medicine

## 2015-11-17 ENCOUNTER — Emergency Department (HOSPITAL_COMMUNITY): Payer: Medicaid Other

## 2015-11-17 ENCOUNTER — Observation Stay (HOSPITAL_COMMUNITY)
Admission: AD | Admit: 2015-11-17 | Discharge: 2015-11-18 | Disposition: A | Discharge status: Home / Self Care | Payer: Medicaid Other | Source: Ambulatory Visit | Attending: Obstetrics and Gynecology | Admitting: Obstetrics and Gynecology

## 2015-11-17 ENCOUNTER — Encounter (HOSPITAL_COMMUNITY): Payer: Self-pay | Admitting: Emergency Medicine

## 2015-11-17 ENCOUNTER — Inpatient Hospital Stay (HOSPITAL_COMMUNITY): Payer: Medicaid Other | Admitting: Anesthesiology

## 2015-11-17 ENCOUNTER — Encounter (HOSPITAL_COMMUNITY)
Admission: AD | Disposition: A | Discharge status: Home / Self Care | Payer: Self-pay | Source: Ambulatory Visit | Attending: Obstetrics and Gynecology

## 2015-11-17 ENCOUNTER — Encounter (HOSPITAL_COMMUNITY): Payer: Self-pay | Admitting: *Deleted

## 2015-11-17 DIAGNOSIS — A6000 Herpesviral infection of urogenital system, unspecified: Secondary | ICD-10-CM | POA: Insufficient documentation

## 2015-11-17 DIAGNOSIS — F329 Major depressive disorder, single episode, unspecified: Secondary | ICD-10-CM | POA: Diagnosis not present

## 2015-11-17 DIAGNOSIS — O99331 Smoking (tobacco) complicating pregnancy, first trimester: Secondary | ICD-10-CM | POA: Insufficient documentation

## 2015-11-17 DIAGNOSIS — O98711 Human immunodeficiency virus [HIV] disease complicating pregnancy, first trimester: Secondary | ICD-10-CM

## 2015-11-17 DIAGNOSIS — Z8571 Personal history of Hodgkin lymphoma: Secondary | ICD-10-CM | POA: Insufficient documentation

## 2015-11-17 DIAGNOSIS — O99341 Other mental disorders complicating pregnancy, first trimester: Secondary | ICD-10-CM

## 2015-11-17 DIAGNOSIS — Z3A01 Less than 8 weeks gestation of pregnancy: Secondary | ICD-10-CM

## 2015-11-17 DIAGNOSIS — O001 Tubal pregnancy without intrauterine pregnancy: Secondary | ICD-10-CM

## 2015-11-17 DIAGNOSIS — F17210 Nicotine dependence, cigarettes, uncomplicated: Secondary | ICD-10-CM | POA: Insufficient documentation

## 2015-11-17 DIAGNOSIS — Z21 Asymptomatic human immunodeficiency virus [HIV] infection status: Secondary | ICD-10-CM | POA: Insufficient documentation

## 2015-11-17 DIAGNOSIS — Z85048 Personal history of other malignant neoplasm of rectum, rectosigmoid junction, and anus: Secondary | ICD-10-CM

## 2015-11-17 DIAGNOSIS — O009 Unspecified ectopic pregnancy without intrauterine pregnancy: Secondary | ICD-10-CM

## 2015-11-17 DIAGNOSIS — O00109 Unspecified tubal pregnancy without intrauterine pregnancy: Secondary | ICD-10-CM | POA: Diagnosis present

## 2015-11-17 DIAGNOSIS — O98311 Other infections with a predominantly sexual mode of transmission complicating pregnancy, first trimester: Secondary | ICD-10-CM

## 2015-11-17 HISTORY — PX: DIAGNOSTIC LAPAROSCOPY WITH REMOVAL OF ECTOPIC PREGNANCY: SHX6449

## 2015-11-17 LAB — URINALYSIS, ROUTINE W REFLEX MICROSCOPIC
BILIRUBIN URINE: NEGATIVE
Glucose, UA: NEGATIVE mg/dL
Ketones, ur: 15 mg/dL — AB
Nitrite: NEGATIVE
PROTEIN: NEGATIVE mg/dL
Specific Gravity, Urine: 1.025 (ref 1.005–1.030)
pH: 6 (ref 5.0–8.0)

## 2015-11-17 LAB — URINE MICROSCOPIC-ADD ON

## 2015-11-17 LAB — CBC
HCT: 40.4 % (ref 36.0–46.0)
HCT: 39.1 % (ref 36.0–46.0)
HEMOGLOBIN: 14.2 g/dL (ref 12.0–15.0)
Hemoglobin: 14.1 g/dL (ref 12.0–15.0)
MCH: 32.5 pg (ref 26.0–34.0)
MCH: 32.9 pg (ref 26.0–34.0)
MCHC: 34.9 g/dL (ref 30.0–36.0)
MCHC: 36.3 g/dL — AB (ref 30.0–36.0)
MCV: 93.1 fL (ref 78.0–100.0)
MCV: 90.5 fL (ref 78.0–100.0)
PLATELETS: 250 10*3/uL (ref 150–400)
PLATELETS: 266 10*3/uL (ref 150–400)
RBC: 4.34 MIL/uL (ref 3.87–5.11)
RBC: 4.32 MIL/uL (ref 3.87–5.11)
RDW: 14.5 % (ref 11.5–15.5)
RDW: 14.6 % (ref 11.5–15.5)
WBC: 7.3 10*3/uL (ref 4.0–10.5)
WBC: 8.4 10*3/uL (ref 4.0–10.5)

## 2015-11-17 LAB — COMPREHENSIVE METABOLIC PANEL
ALK PHOS: 70 U/L (ref 38–126)
ALT: 14 U/L (ref 14–54)
AST: 17 U/L (ref 15–41)
Albumin: 4.1 g/dL (ref 3.5–5.0)
Anion gap: 5 (ref 5–15)
BUN: 5 mg/dL — AB (ref 6–20)
CALCIUM: 9.1 mg/dL (ref 8.9–10.3)
CO2: 26 mmol/L (ref 22–32)
CREATININE: 0.64 mg/dL (ref 0.44–1.00)
Chloride: 104 mmol/L (ref 101–111)
GFR calc non Af Amer: >60 mL/min (ref >60)
GLUCOSE: 86 mg/dL (ref 65–99)
Potassium: 3.7 mmol/L (ref 3.5–5.1)
SODIUM: 135 mmol/L (ref 135–145)
Total Bilirubin: 1.6 mg/dL — ABNORMAL HIGH (ref 0.3–1.2)
Total Protein: 7.2 g/dL (ref 6.5–8.1)

## 2015-11-17 LAB — LIPASE, BLOOD: Lipase: 13 U/L (ref 11–51)

## 2015-11-17 LAB — ABO/RH: ABO/RH(D): O NEG

## 2015-11-17 LAB — HCG, QUANTITATIVE, PREGNANCY: hCG, Beta Chain, Quant, S: 102566 m[IU]/mL — ABNORMAL HIGH (ref <5)

## 2015-11-17 LAB — I-STAT BETA HCG BLOOD, ED (MC, WL, AP ONLY)

## 2015-11-17 SURGERY — LAPAROSCOPY, WITH ECTOPIC PREGNANCY SURGICAL TREATMENT
Anesthesia: General | Site: Abdomen

## 2015-11-17 MED ORDER — RHO D IMMUNE GLOBULIN 1500 UNIT/2ML IJ SOSY
300.0000 ug | PREFILLED_SYRINGE | Freq: Once | INTRAMUSCULAR | Status: AC
  Administered 2015-11-18: 300 ug via INTRAMUSCULAR
  Filled 2015-11-17: qty 2

## 2015-11-17 MED ORDER — ONDANSETRON HCL 4 MG/2ML IJ SOLN
INTRAMUSCULAR | Status: DC | PRN
  Administered 2015-11-17: 4 mg via INTRAVENOUS

## 2015-11-17 MED ORDER — LIDOCAINE HCL (CARDIAC) 20 MG/ML IV SOLN
INTRAVENOUS | Status: DC | PRN
  Administered 2015-11-17: 80 mg via INTRAVENOUS

## 2015-11-17 MED ORDER — FENTANYL CITRATE (PF) 100 MCG/2ML IJ SOLN
INTRAMUSCULAR | Status: DC | PRN
  Administered 2015-11-17 (×2): 50 ug via INTRAVENOUS
  Administered 2015-11-17: 100 ug via INTRAVENOUS
  Administered 2015-11-17: 50 ug via INTRAVENOUS

## 2015-11-17 MED ORDER — OXYCODONE-ACETAMINOPHEN 5-325 MG PO TABS
1.0000 | ORAL_TABLET | ORAL | Status: DC | PRN
  Administered 2015-11-17: 1 via ORAL
  Filled 2015-11-17: qty 1

## 2015-11-17 MED ORDER — PHENYLEPHRINE HCL 10 MG/ML IJ SOLN
INTRAMUSCULAR | Status: DC | PRN
  Administered 2015-11-17 (×2): 80 ug via INTRAVENOUS
  Administered 2015-11-17: 40 ug via INTRAVENOUS
  Administered 2015-11-17: 80 ug via INTRAVENOUS

## 2015-11-17 MED ORDER — LACTATED RINGERS IV SOLN
INTRAVENOUS | Status: DC | PRN
Start: 2015-11-17 — End: 2015-11-18
  Administered 2015-11-17 (×2): via INTRAVENOUS

## 2015-11-17 MED ORDER — FAMOTIDINE IN NACL 20-0.9 MG/50ML-% IV SOLN
20.0000 mg | Freq: Once | INTRAVENOUS | Status: AC
  Administered 2015-11-17: 20 mg via INTRAVENOUS
  Filled 2015-11-17: qty 50

## 2015-11-17 MED ORDER — SODIUM CHLORIDE 0.9 % IV SOLN: INTRAVENOUS | Status: DC

## 2015-11-17 MED ORDER — FENTANYL CITRATE (PF) 100 MCG/2ML IJ SOLN
INTRAMUSCULAR | Status: AC
  Filled 2015-11-17: qty 2

## 2015-11-17 MED ORDER — SUCCINYLCHOLINE CHLORIDE 20 MG/ML IJ SOLN
INTRAMUSCULAR | Status: DC | PRN
  Administered 2015-11-17: 100 mg via INTRAVENOUS

## 2015-11-17 MED ORDER — FENTANYL CITRATE (PF) 100 MCG/2ML IJ SOLN: 25.0000 ug | INTRAMUSCULAR | Status: DC | PRN

## 2015-11-17 MED ORDER — PROPOFOL 10 MG/ML IV BOLUS
INTRAVENOUS | Status: DC | PRN
  Administered 2015-11-17 (×2): 100 mg via INTRAVENOUS

## 2015-11-17 MED ORDER — LIDOCAINE-EPINEPHRINE (PF) 1 %-1:200000 IJ SOLN
INTRAMUSCULAR | Status: DC | PRN
  Administered 2015-11-17: 20 mL

## 2015-11-17 MED ORDER — MIDAZOLAM HCL 2 MG/2ML IJ SOLN
INTRAMUSCULAR | Status: AC
  Filled 2015-11-17: qty 2

## 2015-11-17 MED ORDER — ROCURONIUM BROMIDE 100 MG/10ML IV SOLN
INTRAVENOUS | Status: DC | PRN
Start: 2015-11-17 — End: 2015-11-18
  Administered 2015-11-17 (×2): 20 mg via INTRAVENOUS

## 2015-11-17 MED ORDER — DEXAMETHASONE SODIUM PHOSPHATE 10 MG/ML IJ SOLN
INTRAMUSCULAR | Status: DC | PRN
  Administered 2015-11-17: 4 mg via INTRAVENOUS

## 2015-11-17 MED ORDER — LACTATED RINGERS IV SOLN
INTRAVENOUS | Status: DC
  Administered 2015-11-17: 22:00:00 via INTRAVENOUS

## 2015-11-17 MED ORDER — MIDAZOLAM HCL 2 MG/2ML IJ SOLN
INTRAMUSCULAR | Status: DC | PRN
  Administered 2015-11-17: 2 mg via INTRAVENOUS

## 2015-11-17 MED ORDER — FENTANYL CITRATE (PF) 250 MCG/5ML IJ SOLN
INTRAMUSCULAR | Status: AC
  Filled 2015-11-17: qty 5

## 2015-11-17 MED ORDER — SOD CITRATE-CITRIC ACID 500-334 MG/5ML PO SOLN
30.0000 mL | Freq: Once | ORAL | Status: AC
  Administered 2015-11-17: 30 mL via ORAL
  Filled 2015-11-17: qty 15

## 2015-11-17 SURGICAL SUPPLY — 30 items
BARRIER ADHS 3X4 INTERCEED (GAUZE/BANDAGES/DRESSINGS) IMPLANT
CLOTH BEACON ORANGE TIMEOUT ST (SAFETY) ×3 IMPLANT
DRSG OPSITE POSTOP 3X4 (GAUZE/BANDAGES/DRESSINGS) ×3 IMPLANT
EVACUATOR SMOKE 8.L (FILTER) ×3 IMPLANT
GLOVE BIO SURGEON ST LM GN SZ9 (GLOVE) ×3 IMPLANT
GLOVE BIOGEL PI IND STRL 7.0 (GLOVE) ×1 IMPLANT
GLOVE BIOGEL PI IND STRL 9 (GLOVE) ×2 IMPLANT
GLOVE BIOGEL PI INDICATOR 7.0 (GLOVE) ×2
GLOVE BIOGEL PI INDICATOR 9 (GLOVE) ×4
GOWN STRL REUS W/TWL 2XL LVL3 (GOWN DISPOSABLE) ×3 IMPLANT
GOWN STRL REUS W/TWL LRG LVL3 (GOWN DISPOSABLE) ×6 IMPLANT
LIGASURE BLUNT 5MM 37CM (INSTRUMENTS) IMPLANT
LIGASURE LAP ATLAS 10MM 37CM (INSTRUMENTS) IMPLANT
LIQUID BAND (GAUZE/BANDAGES/DRESSINGS) ×3 IMPLANT
NEEDLE INSUFFLATION 120MM (ENDOMECHANICALS) ×3 IMPLANT
NS IRRIG 1000ML POUR BTL (IV SOLUTION) ×3 IMPLANT
PACK LAPAROSCOPY BASIN (CUSTOM PROCEDURE TRAY) ×3 IMPLANT
PAD TRENDELENBURG POSITION (MISCELLANEOUS) IMPLANT
POUCH SPECIMEN RETRIEVAL 10MM (ENDOMECHANICALS) ×3 IMPLANT
SCISSORS LAP 5X35 DISP (ENDOMECHANICALS) IMPLANT
SET IRRIG TUBING LAPAROSCOPIC (IRRIGATION / IRRIGATOR) ×3 IMPLANT
SHEARS HARMONIC ACE PLUS 36CM (ENDOMECHANICALS) ×3 IMPLANT
SLEEVE XCEL OPT CAN 5 100 (ENDOMECHANICALS) ×3 IMPLANT
SUT VIC AB 4-0 PS2 27 (SUTURE) ×3 IMPLANT
SUT VICRYL 0 UR6 27IN ABS (SUTURE) ×6 IMPLANT
TOWEL OR 17X24 6PK STRL BLUE (TOWEL DISPOSABLE) ×6 IMPLANT
TRAY FOLEY CATH SILVER 14FR (SET/KITS/TRAYS/PACK) ×3 IMPLANT
TROCAR XCEL NON-BLD 11X100MML (ENDOMECHANICALS) IMPLANT
WARMER LAPAROSCOPE (MISCELLANEOUS) ×3 IMPLANT
WATER STERILE IRR 1000ML POUR (IV SOLUTION) ×3 IMPLANT
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
## 2015-11-17 NOTE — ED Triage Notes (Addendum)
Patient reports lower abdominal pain, nausea, vomiting, diarrhea, bilateral breast pain, and lower back pain x1 week. Patient reports having a period last month (starting June 28th) and she is having light spotting now. Reports pain is worse after eating. Denies fever.

## 2015-11-17 NOTE — MAU Provider Note (Signed)
History    CC unruptured right ECTOPIC PREGNANCY with fetal cardiac activity on u/s. CSN: [id number]  Arrival date and time: 11/17/15 2126   None     No chief complaint on file.  HPI this 30 yr female G2P0010 prior left ectopic treated with MTX has been diagnosed with unruptured Right ectopic She was seen at [facility] this morning , and has stable VS and U/s completed 7 pm , no adnexal pain to date. No free fluid. Pt desires future childbearing if possible.   Pertinent Gynecological History: Menses:  Bleeding: none recently Menses irregular, pt thought she was not but 3+ wks Contraception: none DES exposure: unknown Blood transfusions: none Sexually transmitted diseases: HIV positive on meds Previous GYN Procedures: methotrexate for left ectopic, D&C, excision of anal condyloma  Last mammogram:  Date:  Last pap: none in epic Date:    Past Medical History:  Diagnosis Date  . AIN III (anal intraepithelial neoplasia III)   . Anemia   . Cancer ([place])    Hodgkin lymphoma  . Chest wall pain 06/27/2015  . Condyloma acuminatum in female   . Depression   . History of chronic bronchitis   . History of esophagitis    CANDIDA  . History of shingles   . HIV (human immunodeficiency virus infection) ([place])   . Hodgkin's lymphoma ([place]) 06/12/2014  . HSV (herpes simplex virus) infection   . Hypokalemia 07/17/2014  . Periodontitis, chronic   . Screening examination for venereal disease 10/30/2013    Past Surgical History:  Procedure Laterality Date  . DILATION AND CURETTAGE OF UTERUS  2005   MISSED AB  . EXAMINATION UNDER ANESTHESIA N/A 09/23/2012   Procedure: EXAM UNDER ANESTHESIA;  Surgeon: Adin Hector, MD;  Location: [facility];  Service: General;  Laterality: N/A;  . LASER ABLATION CONDOLAMATA N/A 09/23/2012   Procedure: REMOVAL/ABLATION  ABLATION CONDOLAMATA WARTS;  Surgeon: Adin Hector, MD;  Location: [facility];  Service: General;   Laterality: N/A;    Family History  Problem Relation Age of Onset  . Cancer Maternal Aunt     unknown ca  . Cancer Maternal Grandmother     unknown ca    Social History  Substance Use Topics  . Smoking status: Current Every Day Smoker    Packs/day: 0.00    Years: 7.00    Types: Cigars, Cigarettes    Start date: 03/19/2014  . Smokeless tobacco: Never Used     Comment: she smokes 3 Black and Mild Cigars daily  . Alcohol use 0.0 oz/week     Comment: Occasionally    Allergies: No Known Allergies  Prescriptions Prior to Admission  Medication Sig Dispense Refill Last Dose  . acetaminophen (TYLENOL) 500 MG tablet Take 1,000 mg by mouth every 6 (six) hours as needed for mild pain, moderate pain or headache.   11/16/2015 at 1430  . atazanavir (REYATAZ) 300 MG capsule Take 300 mg by mouth daily with breakfast.   11/16/2015 at Unknown time  . emtricitabine-tenofovir (TRUVADA) 200-300 MG tablet Take 1 tablet by mouth daily.   11/16/2015 at Unknown time  . ibuprofen (ADVIL,MOTRIN) 200 MG tablet Take 400 mg by mouth every 6 (six) hours as needed for headache, mild pain or moderate pain.   11/16/2015 at 1800  . ritonavir (NORVIR) 100 MG TABS tablet Take 100 mg by mouth daily with breakfast.   11/16/2015 at Unknown time  . traMADol (ULTRAM) 50 MG tablet Take 50 mg  by mouth every 6 (six) hours as needed for moderate pain.   Past Week at Unknown time    ROS Physical Exam   Last menstrual period 10/23/2015.  Physical Exam  Constitutional: She appears well-developed and well-nourished.  HENT:  Head: Normocephalic.  Eyes: Pupils are equal, round, and reactive to light.  Cardiovascular: Normal rate.   Respiratory: Effort normal.  GI: Soft. There is no tenderness. There is no rebound and no guarding.  Genitourinary: Vagina normal.  Genitourinary Comments: U/s reviewed photos, anteverted uterus, empty, with gestational sac with 2.0 cm fetal pole and fetal heartbeat in fight adnexa    MAU  Course  Procedures  MDM Review of records , u/s and planning  Assessment and Plan  1 Unruptured right ectopic with Fetal cardiac activity, not a candidate for medical therapy 2. HIV positive on antiviral tx. 3. Last po liquids 4-6 oz soft drink 7 pm  Plan : Recommend surgical tx of ectopic, probable right salpingectomy,  Risks rationale and possible complications reviewed with the patient ABO Rh ordered.  Mae Denunzio V 11/17/2015, 9:34 PM

## 2015-11-17 NOTE — ED Notes (Signed)
Pt left department without informing staff. Pt was to be transferred to [facility] MAU for further treatment. Provider made aware. Attempts made by Charge nurse to contact pt but cell phone number listed not in service.

## 2015-11-17 NOTE — ED Notes (Signed)
Called cell phone # listed on chart and that number is not in service; called and left message at home # to call back; Notified MAU that pt left our ER prior to transfer

## 2015-11-17 NOTE — ED Notes (Signed)
Patient instructed pain medication can make her drowsy. Instructed to not drive or operate any heavy machinery after medication administration.

## 2015-11-17 NOTE — H&P (Signed)
History    CC unruptured right ECTOPIC PREGNANCY with fetal cardiac activity on u/s. CSN: [id number]  Arrival date and time: 11/17/15 2126   None     No chief complaint on file.  HPI this 30 yr female G2P0010 prior left ectopic treated with MTX has been diagnosed with unruptured Right ectopic She was seen at [facility] this morning , and has stable VS and U/s completed 7 pm , no adnexal pain to date. No free fluid. Pt desires future childbearing if possible.   Pertinent Gynecological History: Menses:  Bleeding: none recently Menses irregular, pt thought she was not but 3+ wks Contraception: none DES exposure: unknown Blood transfusions: none Sexually transmitted diseases: HIV positive on meds Previous GYN Procedures: methotrexate for left ectopic, D&C, excision of anal condyloma  Last mammogram:  Date:  Last pap: none in epic Date:        Past Medical History:  Diagnosis Date  . AIN III (anal intraepithelial neoplasia III)   . Anemia   . Cancer ([place])    Hodgkin lymphoma  . Chest wall pain 06/27/2015  . Condyloma acuminatum in female   . Depression   . History of chronic bronchitis   . History of esophagitis    CANDIDA  . History of shingles   . HIV (human immunodeficiency virus infection) ([place])   . Hodgkin's lymphoma ([place]) 06/12/2014  . HSV (herpes simplex virus) infection   . Hypokalemia 07/17/2014  . Periodontitis, chronic   . Screening examination for venereal disease 10/30/2013         Past Surgical History:  Procedure Laterality Date  . DILATION AND CURETTAGE OF UTERUS  2005   MISSED AB  . EXAMINATION UNDER ANESTHESIA N/A 09/23/2012   Procedure: EXAM UNDER ANESTHESIA;  Surgeon: Adin Hector, MD;  Location: [facility];  Service: General;  Laterality: N/A;  . LASER ABLATION CONDOLAMATA N/A 09/23/2012   Procedure: REMOVAL/ABLATION  ABLATION CONDOLAMATA WARTS;  Surgeon: Adin Hector, MD;  Location: [facility];  Service: General;  Laterality: N/A;          Family History  Problem Relation Age of Onset  . Cancer Maternal Aunt     unknown ca  . Cancer Maternal Grandmother     unknown ca           Social History  Substance Use Topics  . Smoking status: Current Every Day Smoker    Packs/day: 0.00    Years: 7.00    Types: Cigars, Cigarettes    Start date: 03/19/2014  . Smokeless tobacco: Never Used     Comment: she smokes 3 Black and Mild Cigars daily  . Alcohol use 0.0 oz/week      Comment: Occasionally    Allergies: No Known Allergies  Prescriptions Prior to Admission  Medication Sig Dispense Refill Last Dose  . acetaminophen (TYLENOL) 500 MG tablet Take 1,000 mg by mouth every 6 (six) hours as needed for mild pain, moderate pain or headache.   11/16/2015 at 1430  . atazanavir (REYATAZ) 300 MG capsule Take 300 mg by mouth daily with breakfast.   11/16/2015 at Unknown time  . emtricitabine-tenofovir (TRUVADA) 200-300 MG tablet Take 1 tablet by mouth daily.   11/16/2015 at Unknown time  . ibuprofen (ADVIL,MOTRIN) 200 MG tablet Take 400 mg by mouth every 6 (six) hours as needed for headache, mild pain or moderate pain.   11/16/2015 at 1800  . ritonavir (NORVIR) 100 MG TABS tablet Take  100 mg by mouth daily with breakfast.   11/16/2015 at Unknown time  . traMADol (ULTRAM) 50 MG tablet Take 50 mg by mouth every 6 (six) hours as needed for moderate pain.   Past Week at Unknown time    ROS Physical Exam   Last menstrual period 10/23/2015.  Physical Exam  Constitutional: She appears well-developed and well-nourished.  HENT:  Head: Normocephalic.  Eyes: Pupils are equal, round, and reactive to light.  Cardiovascular: Normal rate.   Respiratory: Effort normal.  GI: Soft. There is no tenderness. There is no rebound and no guarding.  Genitourinary: Vagina normal.  Genitourinary Comments: U/s reviewed photos, anteverted uterus, empty, with  gestational sac with 2.0 cm fetal pole and fetal heartbeat in fight adnexa    MAU Course  Procedures  MDM Review of records , u/s and planning  Assessment and Plan  1 Unruptured right ectopic with Fetal cardiac activity, not a candidate for medical therapy 2. HIV positive on antiviral tx. 3. Last po liquids 4-6 oz soft drink 7 pm  Plan : Recommend surgical tx of ectopic, probable right salpingectomy,  Risks rationale and possible complications reviewed with the patient ABO Rh ordered.  [name]

## 2015-11-17 NOTE — Anesthesia Preprocedure Evaluation (Signed)
Anesthesia Evaluation  Patient identified by MRN, date of birth, ID band Patient awake    Reviewed: Allergy & Precautions, H&P , Patient's Chart, lab work & pertinent test results, reviewed documented beta blocker date and time   Airway Mallampati: II  TM Distance: >3 FB Neck ROM: full    Dental no notable dental hx.    Pulmonary Current Smoker,    Pulmonary exam normal breath sounds clear to auscultation       Cardiovascular  Rhythm:regular Rate:Normal     Neuro/Psych    GI/Hepatic   Endo/Other    Renal/GU      Musculoskeletal   Abdominal   Peds  Hematology  (+) HIV,   Anesthesia Other Findings   Reproductive/Obstetrics                             Anesthesia Physical Anesthesia Plan  ASA: II and emergent  Anesthesia Plan: General   Post-op Pain Management:    Induction: Intravenous and Rapid sequence  Airway Management Planned: Oral ETT  Additional Equipment:   Intra-op Plan:   Post-operative Plan: Extubation in OR  Informed Consent: I have reviewed the patients History and Physical, chart, labs and discussed the procedure including the risks, benefits and alternatives for the proposed anesthesia with the patient or authorized representative who has indicated his/her understanding and acceptance.   Dental Advisory Given and Dental advisory given  Plan Discussed with: CRNA and Surgeon  Anesthesia Plan Comments: (  Discussed general anesthesia, including possible nausea, instrumentation of airway, sore throat,pulmonary aspiration, etc. I asked if the were any outstanding questions, or  concerns before we proceeded. )       Anesthesia Quick Evaluation

## 2015-11-17 NOTE — ED Notes (Signed)
PT arrived to [facility]; pt left AMA from [facility] ER

## 2015-11-18 ENCOUNTER — Other Ambulatory Visit: Payer: Self-pay | Admitting: Advanced Practice Midwife

## 2015-11-18 ENCOUNTER — Other Ambulatory Visit: Payer: Self-pay | Admitting: Obstetrics and Gynecology

## 2015-11-18 MED ORDER — ONDANSETRON HCL 4 MG/2ML IJ SOLN
INTRAMUSCULAR | Status: AC
  Filled 2015-11-18: qty 2

## 2015-11-18 MED ORDER — SUGAMMADEX SODIUM 200 MG/2ML IV SOLN
INTRAVENOUS | Status: AC
  Filled 2015-11-18: qty 2

## 2015-11-18 MED ORDER — LIDOCAINE HCL (CARDIAC) 20 MG/ML IV SOLN
INTRAVENOUS | Status: AC
  Filled 2015-11-18: qty 5

## 2015-11-18 MED ORDER — SUCCINYLCHOLINE CHLORIDE 20 MG/ML IJ SOLN
INTRAMUSCULAR | Status: AC
  Filled 2015-11-18: qty 1

## 2015-11-18 MED ORDER — ROCURONIUM BROMIDE 100 MG/10ML IV SOLN
INTRAVENOUS | Status: AC
  Filled 2015-11-18: qty 1

## 2015-11-18 MED ORDER — NEOSTIGMINE METHYLSULFATE 10 MG/10ML IV SOLN
INTRAVENOUS | Status: DC | PRN
  Administered 2015-11-18: 4 mg via INTRAVENOUS

## 2015-11-18 MED ORDER — SUGAMMADEX SODIUM 200 MG/2ML IV SOLN
INTRAVENOUS | Status: DC | PRN
  Administered 2015-11-18: 200 mg via INTRAVENOUS

## 2015-11-18 MED ORDER — OXYCODONE-ACETAMINOPHEN 5-325 MG PO TABS: 1.0000 | ORAL_TABLET | Freq: Four times a day (QID) | ORAL | Status: DC | PRN

## 2015-11-18 MED ORDER — DEXAMETHASONE SODIUM PHOSPHATE 4 MG/ML IJ SOLN
INTRAMUSCULAR | Status: AC
  Filled 2015-11-18: qty 1

## 2015-11-18 MED ORDER — GLYCOPYRROLATE 0.2 MG/ML IJ SOLN
INTRAMUSCULAR | Status: DC | PRN
  Administered 2015-11-18: 0.6 mg via INTRAVENOUS

## 2015-11-18 MED ORDER — PROPOFOL 10 MG/ML IV BOLUS
INTRAVENOUS | Status: AC
  Filled 2015-11-18: qty 20

## 2015-11-18 NOTE — Discharge Instructions (Signed)
General Anesthesia, Adult, Care After  Please drink enough liquids to keep urine light yellow/clear. Remove dressings tomorrow night. Take after 24hrs. Gently let water run over incision.  Do not pick or pull at skin glue on abdomen.  No hot tubs, baths, swimming pools, sex, tampons, or douching until seen by MD for follow up appt.  Refer to this sheet in the next few weeks. These instructions provide you with information on caring for yourself after your procedure. Your health care provider may also give you more specific instructions. Your treatment has been planned according to current medical practices, but problems sometimes occur. Call your health care provider if you have any problems or questions after your procedure. WHAT TO EXPECT AFTER THE PROCEDURE After the procedure, it is typical to experience:  Sleepiness.  Nausea and vomiting. HOME CARE INSTRUCTIONS  For the first 24 hours after general anesthesia:  Have a responsible person with you.  Do not drive a car. If you are alone, do not take public transportation.  Do not drink alcohol.  Do not take medicine that has not been prescribed by your health care provider.  Do not sign important papers or make important decisions.  You may resume a normal diet and activities as directed by your health care provider.  Change bandages (dressings) as directed.  If you have questions or problems that seem related to general anesthesia, call the hospital and ask for the anesthetist or anesthesiologist on call. SEEK MEDICAL CARE IF:  You have nausea and vomiting that continue the day after anesthesia.  You develop a rash. SEEK IMMEDIATE MEDICAL CARE IF:   You have difficulty breathing.  You have chest pain.  You have any allergic problems.   This information is not intended to replace advice given to you by your health care provider. Make sure you discuss any questions you have with your health care provider.   Document  Released: 07/20/2000 Document Revised: 05/04/2014 Document Reviewed: 08/12/2011 Elsevier Interactive Patient Education [employer].    Ectopic Pregnancy An ectopic pregnancy happens when a fertilized egg grows outside the uterus. A pregnancy cannot live outside of the uterus. This problem often happens in the fallopian tube. It is often caused by damage to the fallopian tube. If this problem is found early, you may be treated with medicine. If your tube tears or bursts open (ruptures), you will bleed inside. This is an emergency. You will need surgery. Get help right away.  SYMPTOMS You may have normal pregnancy symptoms at first. These include:  Missing your period.  Feeling sick to your stomach (nauseous).  Being tired.  Having tender breasts. Then, you may start to have symptoms that are not normal. These include:  Pain with sex (intercourse).  Bleeding from the vagina. This includes light bleeding (spotting).  Belly (abdomen) or lower belly cramping or pain. This may be felt on one side.  A fast heartbeat (pulse).  Passing out (fainting) after going poop (bowel movement). If your tube tears, you may have symptoms such as:  Really bad pain in the belly or lower belly. This happens suddenly.  Dizziness.  Passing out.  Shoulder pain. GET HELP RIGHT AWAY IF:  You have any of these symptoms. This is an emergency. MAKE SURE YOU:  Understand these instructions.  Will watch your condition.  Will get help right away if you are not doing well or get worse.   This information is not intended to replace advice given to you by  your health care provider. Make sure you discuss any questions you have with your health care provider.   Document Released: 07/10/2008 Document Revised: 04/18/2013 Document Reviewed: 11/23/2012 Elsevier Interactive Patient Education [employer].

## 2015-11-18 NOTE — Brief Op Note (Signed)
11/17/2015  12:02 AM  PATIENT:  [name]  30 y.o. female  PRE-OPERATIVE DIAGNOSIS:  RIGHT ectopic pregnancy  POST-OPERATIVE DIAGNOSIS:  LEFT ECTOPIC PREGNANCY  PROCEDURE:  Procedure(s): LAPAROSCOPY LEFT  SALPINGECTOMY SECONDARY TO LEFT ECTOPIC PREGNANCY (N/A)  SURGEON:  Surgeon(s) and Role:    * Jonnie Kind, MD - Primary  PHYSICIAN ASSISTANT:   ASSISTANTS: none   ANESTHESIA:   local and general  EBL:  Total I/O In: 500 [I.V.:500] Out: 75 [Urine:50; Blood:25]  BLOOD ADMINISTERED:none  DRAINS: none   LOCAL MEDICATIONS USED:  MARCAINE    and Amount: 20 ml  SPECIMEN:  Source of Specimen:  Left tube with ectopic pregnancy  DISPOSITION OF SPECIMEN:  PATHOLOGY  COUNTS:  YES  TOURNIQUET:  * No tourniquets in log *  DICTATION: .Dragon Dictation  PLAN OF CARE: Discharge to home after PACU  PATIENT DISPOSITION:  PACU - hemodynamically stable.   Delay start of Pharmacological VTE agent (>24hrs) due to surgical blood loss or risk of bleeding: not applicable  Indications ruptured ectopic pregnancy identified by ultrasound interpreted as right ectopic but found on laparoscopy to be ampullary left ectopic Details of procedure. Patient was taken operating room prepped and draped for abdominal procedure with low lithotomy leg support Foley catheter in place and timeout conducted. Attention was first directed to [facility] with rest with single-tooth tenaculum and uterine manipulator. Abdomen was then prepped and draped and infraumbilical vertical 1 Simmons skin incision made as well as suprapubic transverse 2 cm incision, and right lower quadrant incision of 5 mm length. This needle was introduced through the umbilicus being careful oriented needle toward the pelvis to the patient's  body habitus pneumoperitoneum under 4 mmHg intra-abdominal pressure, then laparoscopic trocar introduced under laparoscopic visualization with pelvis inspected. There was minimal blood in the pelvis.  The cul-de-sac was filled with a large hemorrhagic mass which on inspection was the ampullary portion of the left tube this was site of the prior ectopic pregnancy so decision was made for perform salpingectomy. Pubic trocar was placed and then using harmonic scalpel and scopic grasping device a left salpingectomy was performed using harmonic Ace 7 device with good hemostasis achieved despite the large varicosities in the areas feeding the ectopic. Specimen was placed the Endo Catch bag taken out through the suprapubic site required some opening of the fascia to get it out in one specimen. Pelvis was irrigated and confirmed as hemostatic. Amount of fluid was left in the abdomen for assistance with a evacuating the pneumoperitoneum left scopic device removed, the fascia closed with 0 Vicryl at the midline incisions Amma then subcuticular 4-0 Vicryl used on all 3 incisions with Steri-Strips applied and patient going to recovery. Marcaine was injected around the incisions patient will go to recovery room and be discharged home. RhoGAM will be given in recovery

## 2015-11-18 NOTE — Op Note (Signed)
Please see the brief operative note for surgical details 

## 2015-11-18 NOTE — Transfer of Care (Signed)
Immediate Anesthesia Transfer of Care Note  Patient: [name]  Procedure(s) Performed: Procedure(s): LAPAROSCOPY LEFT  SALPINGECTOMY SECONDARY TO LEFT ECTOPIC PREGNANCY (N/A)  Patient Location: PACU  Anesthesia Type:General  Level of Consciousness: awake, alert  and oriented  Airway & Oxygen Therapy: Patient Spontanous Breathing and Patient connected to nasal cannula oxygen  Post-op Assessment: Report given to RN and Post -op Vital signs reviewed and stable  Post vital signs: Reviewed and stable  Last Vitals:  Vitals:   11/17/15 2148  BP: 116/65  Pulse: (!) 58  Resp: 15  Temp: 37 C    Last Pain:  Vitals:   11/17/15 2147  PainSc: 0-No pain         Complications: No apparent anesthesia complications

## 2015-11-19 LAB — RH IG WORKUP (INCLUDES ABO/RH)
ABO/RH(D): O NEG
ANTIBODY SCREEN: NEGATIVE
GESTATIONAL AGE(WKS): 3
Unit division: 0

## 2015-11-20 NOTE — Anesthesia Postprocedure Evaluation (Signed)
Anesthesia Post Note  Patient: [name]  Procedure(s) Performed: Procedure(s) (LRB): LAPAROSCOPY LEFT  SALPINGECTOMY SECONDARY TO LEFT ECTOPIC PREGNANCY (N/A)  Patient location during evaluation: PACU Anesthesia Type: General Level of consciousness: sedated Pain management: satisfactory to patient Vital Signs Assessment: post-procedure vital signs reviewed and stable Respiratory status: spontaneous breathing Cardiovascular status: stable Anesthetic complications: no    Last Vitals:  Vitals:   11/18/15 0100 11/18/15 0130  BP: 90/60 (!) 97/58  Pulse: (!) 57 (!) 52  Resp: 14 15  Temp: 36.9 C 36.8 C    Last Pain:  Vitals:   11/18/15 0100  PainSc: 2                  Anel Creighton EDWARD

## 2015-11-25 NOTE — Discharge Summary (Signed)
Physician Discharge Summary  Patient ID: [name] MRN: [id number] DOB/AGE: 06/20/1985 30 y.o.  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 No free fluid. She was transferred to [facility] hospital for surgical management due to advanced ectopic  Consults: gynecology [place]  Significant Diagnostic Studies: radiology: Ultrasound: . IMPRESSION: Right adnexal ectopic pregnancy, living. Small amount of free pelvic fluid. Critical Value/emergent results were called by telephone at the time of interpretation on 11/17/2015 at 7:10 pm to [facility] , who verbally acknowledged these results. Electronically Signed   By: Margarette Canada M.D.   On: 11/17/2015 19:15   Treatments: surgery: Laparoscopy with left salpingectomy and lysis of adhesions, photos documenting procedure taken and are part of Epic/ MYCHART media section. The ectopic was an ampullary left ectopic in a very elongated tube which was directly posterior to the uterus, and so left salpingectomy was performed, and lysis of adhesions around the right tube and ovary were performed. There was extensive thin filmy adhesions consistent with old inflammatory process \\Patient  was given RhoGAM due to Rh- status Discharge Exam: Blood pressure (!) 97/58, pulse (!) 52,  temperature 98.3 F (36.8 C), resp. rate 15, last menstrual period 10/23/2015, SpO2 100 %. General appearance: alert, cooperative and no distress Head: Normocephalic, without obvious abnormality, atraumatic Resp: clear to auscultation bilaterally GI: Incisions clean with dressing in place  Disposition: 01-Home or Self Care  Discharge Instructions    Diet - low sodium heart healthy    Complete by:  As directed   Increase activity slowly    Complete by:  As directed       Medication List    TAKE these medications   acetaminophen 500 MG tablet Commonly known as:  TYLENOL Take 1,000 mg by mouth every 6 (six) hours as needed for mild pain, moderate pain or headache.   atazanavir 300 MG capsule Commonly known as:  REYATAZ Take 300 mg by mouth daily with breakfast.   emtricitabine-tenofovir 200-300 MG tablet Commonly known as:  TRUVADA Take 1 tablet by mouth daily.   ibuprofen 200 MG tablet Commonly known as:  ADVIL,MOTRIN Take 400 mg by mouth every 6 (six) hours as needed for headache, mild pain or moderate pain.   ritonavir 100 MG Tabs tablet Commonly known as:  NORVIR Take 100 mg by mouth daily with breakfast.   traMADol 50 MG tablet Commonly known as:  ULTRAM Take 50 mg by mouth every 6 (six) hours as needed for moderate pain.      [place] [facility] In 2 weeks.   Specialty:  Obstetrics and Gynecology Why:  For wound re-check, Postoperative visit Contact information: [place] [phone]          Signed: Jonnie Kind 11/25/2015, 8:00 AM

## 2015-11-27 ENCOUNTER — Encounter (HOSPITAL_COMMUNITY): Payer: Self-pay | Admitting: Obstetrics and Gynecology

## 2015-12-24 ENCOUNTER — Other Ambulatory Visit: Payer: Self-pay | Admitting: Internal Medicine

## 2015-12-25 ENCOUNTER — Ambulatory Visit: Payer: Medicaid Other | Admitting: Obstetrics & Gynecology

## 2015-12-26 ENCOUNTER — Telehealth: Payer: Self-pay | Admitting: *Deleted

## 2015-12-26 MED ORDER — TRAMADOL HCL 50 MG PO TABS: 50.0000 mg | ORAL_TABLET | Freq: Four times a day (QID) | ORAL | 0 refills | Status: DC | PRN

## 2015-12-26 NOTE — Telephone Encounter (Signed)
Pt left VM yesterday requesting refill on Tramadol.  Refill called into [facility] on [address].

## 2016-01-08 ENCOUNTER — Encounter: Payer: Self-pay | Admitting: Family Medicine

## 2016-01-08 ENCOUNTER — Ambulatory Visit (INDEPENDENT_AMBULATORY_CARE_PROVIDER_SITE_OTHER): Payer: Medicaid Other | Admitting: Family Medicine

## 2016-01-08 VITALS — BP 100/61 | HR 69 | Wt 104.1 lb

## 2016-01-08 DIAGNOSIS — Z9889 Other specified postprocedural states: Secondary | ICD-10-CM

## 2016-01-08 NOTE — Progress Notes (Signed)
   Subjective:    Patient ID: [name], female    DOB: Oct 31, 1985, 30 y.o.   MRN: [id number]  HPI Patient seen for follow up of ectopic pregnancy.  She had laparoscopic left salpingo-oopherectomy for ruptured ectopic on 7/23.  She reports no problems. Menses has returned.  Desires pregnancy.     Review of Systems  Constitutional: Negative for chills, fatigue and fever.  Gastrointestinal: Negative for abdominal pain, diarrhea and nausea.  Genitourinary: Negative for decreased urine volume, dysuria, vaginal bleeding and vaginal discharge.       Objective:   Physical Exam  Constitutional: She appears well-developed and well-nourished.  HENT:  Head: Normocephalic and atraumatic.  Pulmonary/Chest: Effort normal.  Abdominal: Soft. She exhibits no distension and no mass. There is no tenderness. There is no rebound and no guarding.  Skin: Skin is warm and dry.  Psychiatric: She has a normal mood and affect. Her behavior is normal. Judgment and thought content normal.      Assessment & Plan:  1. Status post laparoscopy Continue PNV.  Offered contraception, which was declined.  Pt desires pregnancy.  F/u PRN.

## 2016-01-09 ENCOUNTER — Other Ambulatory Visit: Payer: Self-pay | Admitting: Hematology and Oncology

## 2016-01-21 ENCOUNTER — Telehealth: Payer: Self-pay | Admitting: *Deleted

## 2016-01-21 NOTE — Telephone Encounter (Signed)
Patient called stating that she needs a refill for her Tramadol.

## 2016-01-21 NOTE — Telephone Encounter (Signed)
Pls refill electronically °

## 2016-01-22 ENCOUNTER — Telehealth: Payer: Self-pay | Admitting: *Deleted

## 2016-01-22 ENCOUNTER — Other Ambulatory Visit: Payer: Self-pay | Admitting: Hematology and Oncology

## 2016-01-22 MED ORDER — TRAMADOL HCL 50 MG PO TABS: 50.0000 mg | ORAL_TABLET | Freq: Four times a day (QID) | ORAL | 0 refills | Status: DC | PRN

## 2016-01-22 NOTE — Telephone Encounter (Signed)
Informed pt of Refill Tramadol called into [facility] refill line. She verbalized understanding and confirmed appt for CT scan on 10/4.  Informed her will request lab appt moved from 10/9 to 10/4 at 9;30 am before CT.  She sees Dr. Alvy Bimler on 10/10.  Pt verbalized understanding.  Scheduling message sent to move lab appt.

## 2016-01-29 ENCOUNTER — Other Ambulatory Visit (HOSPITAL_COMMUNITY)
Admission: RE | Admit: 2016-01-29 | Discharge: 2016-01-29 | Disposition: A | Discharge status: Home / Self Care | Payer: Medicaid Other | Source: Ambulatory Visit | Attending: Hematology and Oncology | Admitting: Hematology and Oncology

## 2016-01-29 ENCOUNTER — Other Ambulatory Visit (HOSPITAL_BASED_OUTPATIENT_CLINIC_OR_DEPARTMENT_OTHER): Payer: Medicaid Other

## 2016-01-29 ENCOUNTER — Ambulatory Visit (HOSPITAL_COMMUNITY)
Admission: RE | Admit: 2016-01-29 | Discharge: 2016-01-29 | Disposition: A | Discharge status: Home / Self Care | Payer: Medicaid Other | Source: Ambulatory Visit | Attending: Hematology and Oncology | Admitting: Hematology and Oncology

## 2016-01-29 ENCOUNTER — Telehealth: Payer: Self-pay | Admitting: *Deleted

## 2016-01-29 ENCOUNTER — Encounter (HOSPITAL_COMMUNITY): Payer: Self-pay

## 2016-01-29 DIAGNOSIS — M899 Disorder of bone, unspecified: Secondary | ICD-10-CM | POA: Insufficient documentation

## 2016-01-29 DIAGNOSIS — C8174 Other classical Hodgkin lymphoma, lymph nodes of axilla and upper limb: Secondary | ICD-10-CM | POA: Insufficient documentation

## 2016-01-29 DIAGNOSIS — R590 Localized enlarged lymph nodes: Secondary | ICD-10-CM | POA: Insufficient documentation

## 2016-01-29 LAB — COMPREHENSIVE METABOLIC PANEL
ALT: 14 U/L (ref 0–55)
AST: 16 U/L (ref 5–34)
Albumin: 3.9 g/dL (ref 3.5–5.0)
Alkaline Phosphatase: 92 U/L (ref 40–150)
Anion Gap: 10 mEq/L (ref 3–11)
BUN: 8.8 mg/dL (ref 7.0–26.0)
CHLORIDE: 104 meq/L (ref 98–109)
CO2: 27 meq/L (ref 22–29)
CREATININE: 1 mg/dL (ref 0.6–1.1)
Calcium: 9.7 mg/dL (ref 8.4–10.4)
EGFR: >90 mL/min/{1.73_m2} (ref >90)
GLUCOSE: 96 mg/dL (ref 70–140)
Potassium: 3.4 mEq/L — ABNORMAL LOW (ref 3.5–5.1)
SODIUM: 141 meq/L (ref 136–145)
Total Bilirubin: 2.32 mg/dL — ABNORMAL HIGH (ref 0.20–1.20)
Total Protein: 7.8 g/dL (ref 6.4–8.3)

## 2016-01-29 LAB — CBC WITH DIFFERENTIAL/PLATELET
BASO%: 0.2 % (ref 0.0–2.0)
BASOS ABS: 0 10*3/uL (ref 0.0–0.1)
EOS ABS: 0.1 10*3/uL (ref 0.0–0.5)
EOS%: 1.3 % (ref 0.0–7.0)
HCT: 42.3 % (ref 34.8–46.6)
HGB: 14.5 g/dL (ref 11.6–15.9)
LYMPH%: 23.5 % (ref 14.0–49.7)
MCH: 33.2 pg (ref 25.1–34.0)
MCHC: 34.3 g/dL (ref 31.5–36.0)
MCV: 96.8 fL (ref 79.5–101.0)
MONO#: 0.7 10*3/uL (ref 0.1–0.9)
MONO%: 8 % (ref 0.0–14.0)
NEUT#: 5.5 10*3/uL (ref 1.5–6.5)
NEUT%: 67 % (ref 38.4–76.8)
Platelets: 279 10*3/uL (ref 145–400)
RBC: 4.37 10*6/uL (ref 3.70–5.45)
RDW: 13.4 % (ref 11.2–14.5)
WBC: 8.2 10*3/uL (ref 3.9–10.3)
lymph#: 1.9 10*3/uL (ref 0.9–3.3)

## 2016-01-29 LAB — PREGNANCY, URINE: Preg Test, Ur: NEGATIVE

## 2016-01-29 MED ORDER — IOPAMIDOL (ISOVUE-300) INJECTION 61%
100.0000 mL | Freq: Once | INTRAVENOUS | Status: AC | PRN
  Administered 2016-01-29: 80 mL via INTRAVENOUS

## 2016-01-29 NOTE — Telephone Encounter (Signed)
Pt would like to come in tomorrow to discuss scan.  Msg to scheduler

## 2016-01-30 ENCOUNTER — Encounter: Payer: Self-pay | Admitting: Hematology and Oncology

## 2016-01-30 ENCOUNTER — Ambulatory Visit (HOSPITAL_BASED_OUTPATIENT_CLINIC_OR_DEPARTMENT_OTHER): Payer: Medicaid Other | Admitting: Hematology and Oncology

## 2016-01-30 ENCOUNTER — Telehealth: Payer: Self-pay | Admitting: Hematology and Oncology

## 2016-01-30 VITALS — BP 118/77 | HR 89 | Temp 98.3°F | Resp 17 | Wt 103.7 lb

## 2016-01-30 DIAGNOSIS — R17 Unspecified jaundice: Secondary | ICD-10-CM

## 2016-01-30 DIAGNOSIS — Z720 Tobacco use: Secondary | ICD-10-CM

## 2016-01-30 DIAGNOSIS — C8118 Nodular sclerosis classical Hodgkin lymphoma, lymph nodes of multiple sites: Secondary | ICD-10-CM

## 2016-01-30 DIAGNOSIS — B20 Human immunodeficiency virus [HIV] disease: Secondary | ICD-10-CM | POA: Diagnosis not present

## 2016-01-30 NOTE — Assessment & Plan Note (Signed)
The cause is unknown. Liver parenchyma on CT imaging studies show no abnormal lesions. I recommend observation only.

## 2016-01-30 NOTE — Assessment & Plan Note (Signed)
She continues taking anti-retroviral treatment. Again, I reinforced the importance of her keeping her appointments with infectious disease.

## 2016-01-30 NOTE — Progress Notes (Signed)
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      05/07/2014 Imaging    CT scan of the chest show right thoracic inlet lymphadenopathy      06/07/2014 Procedure    She underwent ultrasound-guided core biopsy of the neck lymph node      06/07/2014 Pathology Results    Accession: [id number] biopsy confirmed diagnosis of Hodgkin lymphoma.      06/15/2014 Imaging    Echocardiogram showed preserved ejection fraction      07/09/2014 - 07/12/2014 Hospital Admission    She was admitted to the hospital for severe anemia.      07/27/2014 Procedure    She had placement of port      07/31/2014 - 09/11/2014 Chemotherapy    She received dose adjusted chemotherapy due to abnormal liver function tests and severe anemia. Treatment was delayed due to noncompliance  and subsequently stopped because the patient failed to keep appointments      01/11/2015 Imaging    Repeat PET CT scan showed response to treatment      01/28/2015 - 06/18/2015 Chemotherapy    ABVD was restarted with full dose.      02/08/2015 - 02/10/2015 Hospital Admission    The patient was admitted to the hospital due to pancytopenia and profuse diarrhea. Cultures were negative. She was placed on ciprofloxacin.      02/11/2015 Adverse Reaction    Treatment was placed on hold due to recent infection.      04/11/2015 Imaging    PET CT scan showed near complete response. Incidental finding of an abnormal bone lesion, indeterminate. She is not symptomatic. Recommendation from [facility] to observe      07/11/2015 Imaging    PET CT scan showed abnormal new  bone lesions, suggestive of possible disease progression      07/23/2015 Bone Marrow Biopsy    She underwent bone biopsy      07/23/2015 Pathology Results    Accession: [id number]  biopsy was negative for cancer      11/21/2015 Surgery    She had surgery for ectopic pregnancy      01/29/2016 Imaging    Ct chest, abdomen and pelvis showed pelvic and retroperitoneal lymphadenopathy, as above, concerning for residual disease. There is also a mildly enlarged posterior mediastinal lymph node measuring 11 mm adjacent to the distal descending thoracic aorta. This may represent an additional focus of disease, but is the only finding of concern in the thorax on today's examination. Sclerosis in the right ilium at site of previously noted metabolically active lesion, grossly unchanged. No other definite osseous lesions are identified on today's examination. Spleen is normal in size and appearance.       INTERVAL HISTORY: Please see below for problem oriented charting. She returns today with her boyfriend for follow-up. The patient have occasional night sweats. She has reduced appetite with occasional nausea and vomiting. She has chronic constipation. The patient had recent ectopic pregnancy status post surgery several months ago  REVIEW OF SYSTEMS:   Constitutional: Denies fevers, chills or abnormal weight loss Eyes: Denies blurriness of vision Ears, nose, mouth, throat,  and face: Denies mucositis or sore throat Respiratory: Denies cough, dyspnea or wheezes Cardiovascular: Denies palpitation, chest discomfort or lower extremity swelling Skin: Denies abnormal skin rashes Lymphatics: Denies new lymphadenopathy or easy bruising Neurological:Denies numbness, tingling or new weaknesses Behavioral/Psych: Mood is stable, no new changes  All other systems were reviewed with the patient and are negative.  I have reviewed the past medical history, past surgical history, social history and family  history with the patient and they are unchanged from previous note.  ALLERGIES:  has No Known Allergies.  MEDICATIONS:  Current Outpatient Prescriptions  Medication Sig Dispense Refill  . acetaminophen (TYLENOL) 500 MG tablet Take 1,000 mg by mouth every 6 (six) hours as needed for mild pain, moderate pain or headache.    . atazanavir (REYATAZ) 300 MG capsule Take 300 mg by mouth daily with breakfast.    . emtricitabine-tenofovir (TRUVADA) 200-300 MG tablet Take 1 tablet by mouth daily.    Marland Kitchen ibuprofen (ADVIL,MOTRIN) 200 MG tablet Take 400 mg by mouth every 6 (six) hours as needed for headache, mild pain or moderate pain.    . ritonavir (NORVIR) 100 MG TABS tablet Take 100 mg by mouth daily with breakfast.    . traMADol (ULTRAM) 50 MG tablet TAKE 1 TABLET BY MOUTH EVERY 6 HOURS AS NEEDED FOR PAIN 30 tablet 0   No current facility-administered medications for this visit.     PHYSICAL EXAMINATION: ECOG PERFORMANCE STATUS: 0 - Asymptomatic  Vitals:   01/30/16 1210  BP: 118/77  Pulse: 89  Resp: 17  Temp: 98.3 F (36.8 C)   Filed Weights   01/30/16 1210  Weight: 103 lb 11.2 oz (47 kg)    GENERAL:alert, no distress and comfortable SKIN: skin color, texture, turgor are normal, no rashes or significant lesions EYES: normal, Conjunctiva are pink and non-injected, sclera clear Musculoskeletal:no cyanosis of digits and no clubbing  NEURO: alert & oriented x 3 with fluent speech, no focal motor/sensory deficits  LABORATORY DATA:  I have reviewed the data as listed    Component Value Date/Time   NA 141 01/29/2016 0901   K 3.4 (L) 01/29/2016 0901   CL 104 11/17/2015 1217   CO2 27 01/29/2016 0901   GLUCOSE 96 01/29/2016 0901   BUN 8.8 01/29/2016 0901   CREATININE 1.0 01/29/2016 0901   CALCIUM 9.7 01/29/2016 0901   PROT 7.8 01/29/2016 0901   ALBUMIN 3.9 01/29/2016 0901   AST 16 01/29/2016 0901   ALT 14 01/29/2016 0901   ALKPHOS 92 01/29/2016 0901   BILITOT 2.32 (H) 01/29/2016  0901   GFRNONAA >60 11/17/2015 1217   GFRNONAA >89 12/04/2014 1014   GFRAA >60 11/17/2015 1217   GFRAA >89 12/04/2014 1014    No results found for: SPEP, UPEP  Lab Results  Component Value Date   WBC 8.2 01/29/2016   NEUTROABS 5.5 01/29/2016   HGB 14.5 01/29/2016   HCT 42.3 01/29/2016   MCV 96.8 01/29/2016   PLT 279 01/29/2016      Chemistry      Component Value Date/Time   NA 141 01/29/2016 0901   K 3.4 (L) 01/29/2016 0901   CL 104 11/17/2015 1217   CO2 27 01/29/2016 0901   BUN 8.8 01/29/2016 0901   CREATININE 1.0 01/29/2016 0901      Component Value Date/Time   CALCIUM 9.7 01/29/2016 0901   ALKPHOS 92 01/29/2016 0901   AST 16 01/29/2016 0901   ALT 14 01/29/2016 0901   BILITOT 2.32 (H)  01/29/2016 0901       RADIOGRAPHIC STUDIES: I have personally reviewed the radiological images as listed and agreed with the findings in the report. Ct Chest W Contrast  Result Date: 01/29/2016 CLINICAL DATA:  30 year old female with history of Hodgkin's lymphoma diagnosed in April 2016. Last chemotherapy completed in February 2017. No current complaints. EXAM: CT CHEST, ABDOMEN, AND PELVIS WITH CONTRAST TECHNIQUE: Multidetector CT imaging of the chest, abdomen and pelvis was performed following the standard protocol during bolus administration of intravenous contrast. CONTRAST:  49m ISOVUE-300 IOPAMIDOL (ISOVUE-300) INJECTION 61% COMPARISON:  PET-CT 07/11/2015. FINDINGS: CT CHEST FINDINGS Cardiovascular: Heart size is normal. There is no significant pericardial fluid, thickening or pericardial calcification. Mediastinum/Nodes: Small amount of soft tissue in the anterior mediastinum likely reflects thymic rebound in this patient following chemotherapy. Mildly enlarged posterior mediastinal lymph nodes measuring up to 11 mm in short axis adjacent to the distal descending thoracic aorta on the right side. No other pathologically enlarged mediastinal or hilar lymph nodes. Esophagus is  unremarkable in appearance. No axillary lymphadenopathy. Lungs/Pleura: 3 mm subpleural nodule in the periphery of the right middle lobe associated with the minor fissure (image 72 of series 5) is unchanged compared to prior examinations, compatible with a benign subpleural lymph node. No other suspicious appearing pulmonary nodules or masses. Mild diffuse centrilobular ground-glass attenuation micro nodularity, favored to reflect a smoking related disease. Mild paraseptal emphysema. No consolidative airspace disease. No pleural effusions. Musculoskeletal: There are no aggressive appearing lytic or blastic lesions noted in the visualized portions of the skeleton. CT ABDOMEN PELVIS FINDINGS Hepatobiliary: No suspicious appearing cystic or solid hepatic lesions. No intra or extrahepatic biliary ductal dilatation. Gallbladder is normal in appearance. Pancreas: No pancreatic mass. No pancreatic ductal dilatation. No pancreatic or peripancreatic fluid or inflammatory changes. Spleen: Spleen is normal in size in appearance. Adrenals/Urinary Tract: Bilateral kidneys and bilateral adrenal glands are normal in appearance. No hydroureteronephrosis. Urinary bladder is nearly decompressed, but otherwise unremarkable in appearance. Stomach/Bowel: The appearance of the stomach is normal. There is no pathologic dilatation of small bowel or colon. The appendix is not confidently identified and may be surgically absent. Regardless, there are no inflammatory changes noted adjacent to the cecum to suggest the presence of an acute appendicitis at this time. Vascular/Lymphatic: No significant atherosclerotic disease, aneurysm or dissection identified in the abdominal or pelvic vasculature. Multiple borderline enlarged and enlarged retroperitoneal lymph nodes, largest of which is in the left para-aortic nodal station beneath the left renal vein measuring 1.5 x 2.8 cm (image 69 of series 2), similar to prior study 02/08/2015 at which time  this measured 2.0 x 2.9 cm. Enlarged aortocaval lymph node measuring 1.2 cm in short axis (image 67 of series 2). Multiple enlarged pelvic lymph nodes, measuring up to 1.2 cm in short axis in the distal right common iliac distribution. Reproductive: Fatty attenuation lesion measuring 3.9 x 2.5 cm in the right adnexal region is similar to the prior study, presumably a dermoid cyst. Uterus and left ovary are unremarkable in appearance. Tampon present in the vagina. Other: No significant volume of ascites.  No pneumoperitoneum. Musculoskeletal: Faint area of sclerosis noted in the right ilium just lateral to the sacroiliac joint, similar to prior studies, at site of previously noted metabolically active lesion on prior PET-CT 07/11/2015. No other aggressive appearing lytic or blastic lesions are noted in the visualized portions of the skeleton. IMPRESSION: 1. Pelvic and retroperitoneal lymphadenopathy, as above, concerning for residual disease. 2. There is also  a mildly enlarged posterior mediastinal lymph node measuring 11 mm adjacent to the distal descending thoracic aorta. This may represent an additional focus of disease, but is the only finding of concern in the thorax on today's examination. 3. Sclerosis in the right ilium at site of previously noted metabolically active lesion, grossly unchanged. No other definite osseous lesions are identified on today's examination. 4. Spleen is normal in size and appearance. 5. Additional incidental findings, as above. Electronically Signed   By: Vinnie Langton M.D.   On: 01/29/2016 14:36   Ct Abdomen Pelvis W Contrast  Result Date: 01/29/2016 CLINICAL DATA:  30 year old female with history of Hodgkin's lymphoma diagnosed in April 2016. Last chemotherapy completed in February 2017. No current complaints. EXAM: CT CHEST, ABDOMEN, AND PELVIS WITH CONTRAST TECHNIQUE: Multidetector CT imaging of the chest, abdomen and pelvis was performed following the standard protocol  during bolus administration of intravenous contrast. CONTRAST:  27m ISOVUE-300 IOPAMIDOL (ISOVUE-300) INJECTION 61% COMPARISON:  PET-CT 07/11/2015. FINDINGS: CT CHEST FINDINGS Cardiovascular: Heart size is normal. There is no significant pericardial fluid, thickening or pericardial calcification. Mediastinum/Nodes: Small amount of soft tissue in the anterior mediastinum likely reflects thymic rebound in this patient following chemotherapy. Mildly enlarged posterior mediastinal lymph nodes measuring up to 11 mm in short axis adjacent to the distal descending thoracic aorta on the right side. No other pathologically enlarged mediastinal or hilar lymph nodes. Esophagus is unremarkable in appearance. No axillary lymphadenopathy. Lungs/Pleura: 3 mm subpleural nodule in the periphery of the right middle lobe associated with the minor fissure (image 72 of series 5) is unchanged compared to prior examinations, compatible with a benign subpleural lymph node. No other suspicious appearing pulmonary nodules or masses. Mild diffuse centrilobular ground-glass attenuation micro nodularity, favored to reflect a smoking related disease. Mild paraseptal emphysema. No consolidative airspace disease. No pleural effusions. Musculoskeletal: There are no aggressive appearing lytic or blastic lesions noted in the visualized portions of the skeleton. CT ABDOMEN PELVIS FINDINGS Hepatobiliary: No suspicious appearing cystic or solid hepatic lesions. No intra or extrahepatic biliary ductal dilatation. Gallbladder is normal in appearance. Pancreas: No pancreatic mass. No pancreatic ductal dilatation. No pancreatic or peripancreatic fluid or inflammatory changes. Spleen: Spleen is normal in size in appearance. Adrenals/Urinary Tract: Bilateral kidneys and bilateral adrenal glands are normal in appearance. No hydroureteronephrosis. Urinary bladder is nearly decompressed, but otherwise unremarkable in appearance. Stomach/Bowel: The appearance of  the stomach is normal. There is no pathologic dilatation of small bowel or colon. The appendix is not confidently identified and may be surgically absent. Regardless, there are no inflammatory changes noted adjacent to the cecum to suggest the presence of an acute appendicitis at this time. Vascular/Lymphatic: No significant atherosclerotic disease, aneurysm or dissection identified in the abdominal or pelvic vasculature. Multiple borderline enlarged and enlarged retroperitoneal lymph nodes, largest of which is in the left para-aortic nodal station beneath the left renal vein measuring 1.5 x 2.8 cm (image 69 of series 2), similar to prior study 02/08/2015 at which time this measured 2.0 x 2.9 cm. Enlarged aortocaval lymph node measuring 1.2 cm in short axis (image 67 of series 2). Multiple enlarged pelvic lymph nodes, measuring up to 1.2 cm in short axis in the distal right common iliac distribution. Reproductive: Fatty attenuation lesion measuring 3.9 x 2.5 cm in the right adnexal region is similar to the prior study, presumably a dermoid cyst. Uterus and left ovary are unremarkable in appearance. Tampon present in the vagina. Other: No significant volume of ascites.  No pneumoperitoneum. Musculoskeletal: Faint area of sclerosis noted in the right ilium just lateral to the sacroiliac joint, similar to prior studies, at site of previously noted metabolically active lesion on prior PET-CT 07/11/2015. No other aggressive appearing lytic or blastic lesions are noted in the visualized portions of the skeleton. IMPRESSION: 1. Pelvic and retroperitoneal lymphadenopathy, as above, concerning for residual disease. 2. There is also a mildly enlarged posterior mediastinal lymph node measuring 11 mm adjacent to the distal descending thoracic aorta. This may represent an additional focus of disease, but is the only finding of concern in the thorax on today's examination. 3. Sclerosis in the right ilium at site of previously  noted metabolically active lesion, grossly unchanged. No other definite osseous lesions are identified on today's examination. 4. Spleen is normal in size and appearance. 5. Additional incidental findings, as above. Electronically Signed   By: Vinnie Langton M.D.   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 The mild lymphadenopathy are nonspecific but given her history of lymphoma with interruptions of treatments, I recommend we repeat PET CT scan for staging. The areas of concerns are not easily accessible with biopsies. If PET CT scan showed no abnormal uptake, that is reassuring. She had recent ectopic pregnancy and surgery and that could cause reactive lymphadenopathy in that region. I will see her back in 10 days to review test results   Human immunodeficiency virus (HIV) disease ([place]) She continues taking anti-retroviral treatment. Again, I reinforced the importance of her keeping her appointments with infectious disease.   Tobacco abuse I spent some time counseling the patient the importance of tobacco cessation. she is currently attempting to quit on her own    Elevated bilirubin The cause is unknown. Liver parenchyma on CT imaging studies show no abnormal lesions. I recommend observation only.   Orders Placed This Encounter  Procedures  . NM PET Image Restag (PS) Skull Base To Thigh    Standing Status:   Future    Standing Expiration Date:   03/05/2017    Order Specific Question:   Reason for exam:    Answer:   hodgkin lymphoma, recent abnormal CT, exclude progression    Order Specific Question:   Preferred imaging location?    Answer:   [facility]   All questions were answered. The patient knows to call the clinic with any problems, questions or concerns. No barriers to learning was detected. I spent 15 minutes counseling the patient face to  face. The total time spent in the appointment was 20 minutes and more than 50% was on counseling and review of test results     Heath Lark, MD 01/30/2016 12:29 PM

## 2016-01-30 NOTE — Telephone Encounter (Signed)
Gv ptappt for 10/16. Advised rad will call for pet scan.

## 2016-01-30 NOTE — Assessment & Plan Note (Signed)
I spent some time counseling the patient the importance of tobacco cessation. she is currently attempting to quit on her own 

## 2016-01-30 NOTE — Assessment & Plan Note (Signed)
I reviewed multiple imaging studies with the patient in great detail. The mild lymphadenopathy are nonspecific but given her history of lymphoma with interruptions of treatments, I recommend we repeat PET CT scan for staging. The areas of concerns are not easily accessible with biopsies. If PET CT scan showed no abnormal uptake, that is reassuring. She had recent ectopic pregnancy and surgery and that could cause reactive lymphadenopathy in that region. I will see her back in 10 days to review test results

## 2016-02-03 ENCOUNTER — Other Ambulatory Visit: Payer: Medicaid Other

## 2016-02-03 ENCOUNTER — Encounter (HOSPITAL_COMMUNITY): Payer: Self-pay | Admitting: *Deleted

## 2016-02-03 ENCOUNTER — Ambulatory Visit (HOSPITAL_COMMUNITY)
Admission: EM | Admit: 2016-02-03 | Discharge: 2016-02-03 | Disposition: A | Discharge status: Home / Self Care | Payer: Medicaid Other | Attending: Internal Medicine | Admitting: Internal Medicine

## 2016-02-03 DIAGNOSIS — L03032 Cellulitis of left toe: Secondary | ICD-10-CM

## 2016-02-03 MED ORDER — SULFAMETHOXAZOLE-TRIMETHOPRIM 800-160 MG PO TABS: 1.0000 | ORAL_TABLET | Freq: Two times a day (BID) | ORAL | 0 refills | Status: AC

## 2016-02-03 NOTE — ED Triage Notes (Signed)
Pt  Reports  Pain  l   Big  Toe   For  sev   Weeks        Pt  Reports    Pain     After   Having  Pedicure         sev   Weeks  Ago      denys  Any  Recent  specefic  Injury
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
## 2016-02-03 NOTE — Discharge Instructions (Addendum)
Soak foot daily in warm water for 5-10 minutes, massage nail fold away from nail and lift toenail gently. Prescription for trimethoprim/sulfa was sent to the [facility] on [address]. Anticipate gradual improvement over the next week or so. Avoid shoes that increase pain in toe.

## 2016-02-04 ENCOUNTER — Ambulatory Visit: Payer: Medicaid Other | Admitting: Hematology and Oncology

## 2016-02-05 ENCOUNTER — Telehealth: Payer: Self-pay | Admitting: *Deleted

## 2016-02-05 NOTE — Telephone Encounter (Signed)
LVM for pt informing of MD appt moved from 10/16 to 10/23.   Scheduling message sent to move appt from 10/16 to 10/23 at 10:45 am.

## 2016-02-05 NOTE — Telephone Encounter (Signed)
-----   Message from Heath Lark, MD sent at 02/05/2016 10:53 AM EDT ----- Jill Alexanders, can you call her and move her appt to 10/23 Monday at 1045 am? ----- Message ----- From: Gaspar Bidding Sent: 02/05/2016  10:15 AM To: Cathlean Cower, RN, Heath Lark, MD  They have her scheduled for 02/14/16. ----- Message ----- From: Heath Lark, MD Sent: 02/05/2016   8:48 AM To: Cathlean Cower, RN, Gaspar Bidding  I got approval A [id number] Please schedule ASAP. If it cannot be done on time, let me know and I will reschedule her return visit ----- Message ----- From: Gaspar Bidding Sent: 02/05/2016   7:37 AM To: Gaspar Bidding, Heath Lark, MD  Dr. Alvy Bimler,  Medicaid denied PET. Peer to peer discussion is needed. Peer to peer information:  Phone: [phone] Case #: [id number] Date of Birth: 05-28-1985  Please let me know how you want to proceed.  Thank you, Darlena

## 2016-02-06 ENCOUNTER — Encounter (HOSPITAL_COMMUNITY): Payer: Medicaid Other

## 2016-02-10 ENCOUNTER — Ambulatory Visit: Payer: Medicaid Other | Admitting: Hematology and Oncology

## 2016-02-14 ENCOUNTER — Encounter (HOSPITAL_COMMUNITY): Payer: Self-pay | Admitting: Radiology

## 2016-02-14 ENCOUNTER — Ambulatory Visit (HOSPITAL_COMMUNITY)
Admission: RE | Admit: 2016-02-14 | Discharge: 2016-02-14 | Disposition: A | Discharge status: Home / Self Care | Payer: Medicaid Other | Source: Ambulatory Visit | Attending: Hematology and Oncology | Admitting: Hematology and Oncology

## 2016-02-14 DIAGNOSIS — C8118 Nodular sclerosis classical Hodgkin lymphoma, lymph nodes of multiple sites: Secondary | ICD-10-CM | POA: Diagnosis not present

## 2016-02-14 LAB — GLUCOSE, CAPILLARY: GLUCOSE-CAPILLARY: 94 mg/dL (ref 65–99)

## 2016-02-14 MED ORDER — FLUDEOXYGLUCOSE F - 18 (FDG) INJECTION
5.1300 | Freq: Once | INTRAVENOUS | Status: AC | PRN
  Administered 2016-02-14: 5.13 via INTRAVENOUS

## 2016-02-17 ENCOUNTER — Encounter: Payer: Self-pay | Admitting: Hematology and Oncology

## 2016-02-17 ENCOUNTER — Other Ambulatory Visit: Payer: Self-pay | Admitting: *Deleted

## 2016-02-17 ENCOUNTER — Ambulatory Visit (HOSPITAL_BASED_OUTPATIENT_CLINIC_OR_DEPARTMENT_OTHER): Payer: Medicaid Other | Admitting: Hematology and Oncology

## 2016-02-17 ENCOUNTER — Telehealth: Payer: Self-pay | Admitting: *Deleted

## 2016-02-17 VITALS — BP 124/73 | HR 70 | Temp 98.1°F | Resp 18 | Ht 64.0 in | Wt 101.7 lb

## 2016-02-17 DIAGNOSIS — C8118 Nodular sclerosis classical Hodgkin lymphoma, lymph nodes of multiple sites: Secondary | ICD-10-CM | POA: Diagnosis not present

## 2016-02-17 DIAGNOSIS — G8929 Other chronic pain: Secondary | ICD-10-CM

## 2016-02-17 DIAGNOSIS — B20 Human immunodeficiency virus [HIV] disease: Secondary | ICD-10-CM

## 2016-02-17 DIAGNOSIS — R591 Generalized enlarged lymph nodes: Secondary | ICD-10-CM

## 2016-02-17 DIAGNOSIS — R599 Enlarged lymph nodes, unspecified: Secondary | ICD-10-CM

## 2016-02-17 DIAGNOSIS — M25531 Pain in right wrist: Secondary | ICD-10-CM

## 2016-02-17 MED ORDER — TRAMADOL HCL 50 MG PO TABS: 50.0000 mg | ORAL_TABLET | Freq: Four times a day (QID) | ORAL | 0 refills | Status: DC | PRN

## 2016-02-17 NOTE — Progress Notes (Signed)
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      05/07/2014 Imaging    CT scan of the chest show right thoracic inlet lymphadenopathy      06/07/2014 Procedure    She underwent ultrasound-guided core biopsy of the neck lymph node      06/07/2014 Pathology Results    Accession: [id number] biopsy confirmed diagnosis of Hodgkin lymphoma.      06/15/2014 Imaging    Echocardiogram showed preserved ejection fraction      07/09/2014 - 07/12/2014 Hospital Admission    She was admitted to the hospital for severe anemia.      07/27/2014 Procedure    She had placement of port      07/31/2014 - 09/11/2014 Chemotherapy    She received dose adjusted chemotherapy due to abnormal liver function tests and severe anemia. Treatment was delayed due to noncompliance  and subsequently stopped because the patient failed to keep appointments      01/11/2015 Imaging    Repeat PET CT scan showed response to treatment      01/28/2015 - 06/18/2015 Chemotherapy    ABVD was restarted with full dose.      02/08/2015 - 02/10/2015 Hospital Admission    The patient was admitted to the hospital due to pancytopenia and profuse diarrhea. Cultures were negative. She was placed on ciprofloxacin.      02/11/2015 Adverse Reaction    Treatment was placed on hold due to recent infection.      04/11/2015 Imaging    PET CT scan showed near complete response. Incidental finding of an abnormal bone lesion, indeterminate. She is not symptomatic. Recommendation from Hem [facility] to observe      07/11/2015 Imaging    PET CT scan showed abnormal new  bone lesions, suggestive of possible disease progression      07/23/2015 Bone Marrow Biopsy    She underwent bone biopsy      07/23/2015 Pathology Results    Accession: [id number]  biopsy was negative for cancer      11/21/2015 Surgery    She had surgery for ectopic pregnancy      01/29/2016 Imaging    Ct chest, abdomen and pelvis showed pelvic and retroperitoneal lymphadenopathy, as above, concerning for residual disease. There is also a mildly enlarged posterior mediastinal lymph node measuring 11 mm adjacent to the distal descending thoracic aorta. This may represent an additional focus of disease, but is the only finding of concern in the thorax on today's examination. Sclerosis in the right ilium at site of previously noted metabolically active lesion, grossly unchanged. No other definite osseous lesions are identified on today's examination. Spleen is normal in size and appearance.      02/14/2016 PET scan    Interval disease worsening with new foci of hypermetabolic activity in multiple retroperitoneal and pelvic lymph nodes as well as AP window and left hilar lymph nodes. ([place] 5). There is also overall worsening of the osseous disease.       INTERVAL HISTORY: Please see below for problem oriented charting. She returns today to review test results. She continues to have diffuse bone pain  throughout. She had poor appetite and recent weight loss. She denies recent fever or chills. She has occasional diarrhea She has lost some weight  REVIEW OF SYSTEMS:   Constitutional: Denies fevers, chills  Eyes: Denies blurriness of vision Ears, nose, mouth, throat, and face: Denies mucositis or sore throat Respiratory: Denies cough, dyspnea or wheezes Cardiovascular: Denies palpitation, chest discomfort or lower extremity swelling Skin: Denies abnormal skin rashes Lymphatics: Denies new lymphadenopathy or easy bruising Neurological:Denies numbness, tingling or new  weaknesses Behavioral/Psych: Mood is stable, no new changes  All other systems were reviewed with the patient and are negative.  I have reviewed the past medical history, past surgical history, social history and family history with the patient and they are unchanged from previous note.  ALLERGIES:  has No Known Allergies.  MEDICATIONS:  Current Outpatient Prescriptions  Medication Sig Dispense Refill  . acetaminophen (TYLENOL) 500 MG tablet Take 1,000 mg by mouth every 6 (six) hours as needed for mild pain, moderate pain or headache.    . atazanavir (REYATAZ) 300 MG capsule Take 300 mg by mouth daily with breakfast.    . emtricitabine-tenofovir (TRUVADA) 200-300 MG tablet Take 1 tablet by mouth daily.    Marland Kitchen ibuprofen (ADVIL,MOTRIN) 200 MG tablet Take 400 mg by mouth every 6 (six) hours as needed for headache, mild pain or moderate pain.    . ritonavir (NORVIR) 100 MG TABS tablet Take 100 mg by mouth daily with breakfast.    . traMADol (ULTRAM) 50 MG tablet Take 1 tablet (50 mg total) by mouth every 6 (six) hours as needed. for pain 90 tablet 0   No current facility-administered medications for this visit.     PHYSICAL EXAMINATION: ECOG PERFORMANCE STATUS: 1 - Symptomatic but completely ambulatory  Vitals:   02/17/16 1215  BP: 124/73  Pulse: 70  Resp: 18  Temp: 98.1 F (36.7 C)   Filed Weights   02/17/16 1215  Weight: 101 lb 11.2 oz (46.1 kg)    GENERAL:alert, no distress and comfortable. She looks thin and cachectic SKIN: skin color, texture, turgor are normal, no rashes or significant lesions EYES: normal, Conjunctiva are pink and non-injected, sclera clear OROPHARYNX:no exudate, no erythema and lips, buccal mucosa, and tongue normal  NECK: supple, thyroid normal size, non-tender, without nodularity LYMPH:  no palpable lymphadenopathy in the cervical, axillary or inguinal LUNGS: clear to auscultation and percussion with normal breathing effort HEART: regular rate & rhythm  and no murmurs and no lower extremity edema ABDOMEN:abdomen soft, non-tender and normal bowel sounds Musculoskeletal:no cyanosis of digits and no clubbing  NEURO: alert & oriented x 3 with fluent speech, no focal motor/sensory deficits  LABORATORY DATA:  I have reviewed the data as listed    Component Value Date/Time   NA 141 01/29/2016 0901   K 3.4 (L) 01/29/2016 0901   CL 104 11/17/2015 1217   CO2 27 01/29/2016 0901   GLUCOSE 96 01/29/2016 0901   BUN 8.8 01/29/2016 0901   CREATININE 1.0 01/29/2016 0901   CALCIUM 9.7 01/29/2016 0901   PROT 7.8 01/29/2016 0901   ALBUMIN 3.9 01/29/2016 0901   AST 16 01/29/2016 0901   ALT 14 01/29/2016 0901   ALKPHOS 92 01/29/2016 0901   BILITOT 2.32 (H) 01/29/2016 0901   GFRNONAA >60 11/17/2015 1217   GFRNONAA >89 12/04/2014 1014   GFRAA >60 11/17/2015 1217   GFRAA >89 12/04/2014 1014    No results found for: SPEP, UPEP  Lab Results  Component Value Date  WBC 8.2 01/29/2016   NEUTROABS 5.5 01/29/2016   HGB 14.5 01/29/2016   HCT 42.3 01/29/2016   MCV 96.8 01/29/2016   PLT 279 01/29/2016      Chemistry      Component Value Date/Time   NA 141 01/29/2016 0901   K 3.4 (L) 01/29/2016 0901   CL 104 11/17/2015 1217   CO2 27 01/29/2016 0901   BUN 8.8 01/29/2016 0901   CREATININE 1.0 01/29/2016 0901      Component Value Date/Time   CALCIUM 9.7 01/29/2016 0901   ALKPHOS 92 01/29/2016 0901   AST 16 01/29/2016 0901   ALT 14 01/29/2016 0901   BILITOT 2.32 (H) 01/29/2016 0901       RADIOGRAPHIC STUDIES:I reviewed the recent PET CT scan with the patient I have personally reviewed the radiological images as listed and agreed with the findings in the report.    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 I will get her case presented at the next hematology tumor board. In a patient with HIV infection, uncontrolled HIV infection or atypical infection can also cause  abnormal PET imaging studies. I favor referral to pulmonology for urgent biopsy before proceed to give her high-dose chemotherapy with possible stem cell transplant in the future. She agreed with the plan of care  Lymphadenopathy She has diffuse lymphadenopathy suspicious for possible cancer recurrence. However, before I subject her form more chemotherapy and potential stem cell transplant, I recommend pulmonology evaluation with plan for repeat biopsy before we proceed with high-dose treatment  Human immunodeficiency virus (HIV) disease ([place]) She has not been evaluated by ID clinic for some time. Her last HIV viral load was detectable in March. Just to be sure that her HIV disease is under good control, I recommend her to call her infectious disease physician and I will send him a clinic note to let his office to schedule an urgent return appointment as soon as possible.  Chronic pain of right wrist She has chronic wrist pain and diffuse musculoskeletal painof unknown etiology. Examination is benign. I recommend vitamin D supplement. I refilled her prescription tramadol today.   Orders Placed This Encounter  Procedures  . Ambulatory referral to Pulmonology    Referral Priority:   Urgent    Referral Type:   Consultation    Referral Reason:   Specialty Services Required    Requested Specialty:   Pulmonary Disease    Number of Visits Requested:   1   All questions were answered. The patient knows to call the clinic with any problems, questions or concerns. No barriers to learning was detected. I spent 25 minutes counseling the patient face to face. The total time spent in the appointment was 40 minutes and more than 50% was on counseling and review of test results     Allison Lark, MD 02/17/2016 12:50 PM

## 2016-02-17 NOTE — Assessment & Plan Note (Signed)
PET CT scan is suspicious for cancer recurrence. I will get her case presented at the next hematology tumor board. In a patient with HIV infection, uncontrolled HIV infection or atypical infection can also cause abnormal PET imaging studies. I favor referral to pulmonology for urgent biopsy before proceed to give her high-dose chemotherapy with possible stem cell transplant in the future. She agreed with the plan of care

## 2016-02-17 NOTE — Assessment & Plan Note (Signed)
She has chronic wrist pain and diffuse musculoskeletal pain of unknown etiology. Examination is benign. I recommend vitamin D supplement. I refilled her prescription tramadol today. 

## 2016-02-17 NOTE — Assessment & Plan Note (Signed)
She has not been evaluated by ID clinic for some time. Her last HIV viral load was detectable in March. Just to be sure that her HIV disease is under good control, I recommend her to call her infectious disease physician and I will send him a clinic note to let his office to schedule an urgent return appointment as soon as possible.

## 2016-02-17 NOTE — Telephone Encounter (Signed)
PT has not shown up for her appt today.  She says she was unaware of appt but she will come now.

## 2016-02-17 NOTE — Assessment & Plan Note (Signed)
She has diffuse lymphadenopathy suspicious for possible cancer recurrence. However, before I subject her form more chemotherapy and potential stem cell transplant, I recommend pulmonology evaluation with plan for repeat biopsy before we proceed with high-dose treatment

## 2016-02-18 ENCOUNTER — Telehealth: Payer: Self-pay | Admitting: *Deleted

## 2016-02-18 DIAGNOSIS — Z113 Encounter for screening for infections with a predominantly sexual mode of transmission: Secondary | ICD-10-CM

## 2016-02-18 DIAGNOSIS — B20 Human immunodeficiency virus [HIV] disease: Secondary | ICD-10-CM

## 2016-02-18 DIAGNOSIS — Z79899 Other long term (current) drug therapy: Secondary | ICD-10-CM

## 2016-02-18 NOTE — Telephone Encounter (Signed)
-----   Message from Thayer Headings, MD sent at 02/18/2016  4:27 PM EDT ----- The oncologist is the one that wants her to be seen and contacted me.  At least do CD 4 and viral load, maybe a check in appt with PharmD.  The oncologist is concerned she has active virus (though I think she likely is ok).   Thanks  ----- Message ----- From: Reggy Eye, CMA Sent: 02/17/2016   4:57 PM To: Thayer Headings, MD  [place],   Called the patient to try and set her up for a visit and she advised she is dealing with cancer right now and can not think about her HIV. But as soon as she gets this taken care of she will call for a visit.  Darnelle Maffucci ----- Message ----- From: Thayer Headings, MD Sent: 02/17/2016   4:49 PM To: [facility] Triage Nurse Pool  She is overdue for labs and an appt.  She needs CD4 and viral load and see me next available.  thanks

## 2016-02-18 NOTE — Telephone Encounter (Signed)
Per Dr Linus Salmons patient scheduled for a follow up visit and to get labs.

## 2016-02-19 ENCOUNTER — Telehealth: Payer: Self-pay | Admitting: *Deleted

## 2016-02-19 NOTE — Telephone Encounter (Signed)
Patient called back, decided to go ahead and get labs as soon as possible. Scheduled patient for labs 10/26 at 9am, orders placed. Landis Gandy, RN

## 2016-02-19 NOTE — Addendum Note (Signed)
Addended by: Landis Gandy on: 02/19/2016 05:13 PM   Modules accepted: Orders

## 2016-02-19 NOTE — Telephone Encounter (Signed)
Pulmonary Consult scheduled w/ Dr. Melvyn Novas Friday 1/027 at 3:15 pm.   Notified pt of appt and important to keep this appt as scheduled.  Gave her the address and phone number to their office. She verbalized understanding.

## 2016-02-20 ENCOUNTER — Other Ambulatory Visit: Payer: Medicaid Other

## 2016-02-20 DIAGNOSIS — B20 Human immunodeficiency virus [HIV] disease: Secondary | ICD-10-CM

## 2016-02-20 DIAGNOSIS — Z113 Encounter for screening for infections with a predominantly sexual mode of transmission: Secondary | ICD-10-CM

## 2016-02-20 DIAGNOSIS — Z79899 Other long term (current) drug therapy: Secondary | ICD-10-CM

## 2016-02-20 LAB — CBC WITH DIFFERENTIAL/PLATELET
BASOS ABS: 48 {cells}/uL (ref 0–200)
Basophils Relative: 1 %
EOS ABS: 144 {cells}/uL (ref 15–500)
Eosinophils Relative: 3 %
HEMATOCRIT: 39.9 % (ref 35.0–45.0)
Hemoglobin: 13.9 g/dL (ref 11.7–15.5)
Lymphocytes Relative: 38 %
Lymphs Abs: 1824 cells/uL (ref 850–3900)
MCH: 33.3 pg — AB (ref 27.0–33.0)
MCHC: 34.8 g/dL (ref 32.0–36.0)
MCV: 95.5 fL (ref 80.0–100.0)
MONO ABS: 528 {cells}/uL (ref 200–950)
MONOS PCT: 11 %
MPV: 9.6 fL (ref 7.5–12.5)
NEUTROS ABS: 2256 {cells}/uL (ref 1500–7800)
Neutrophils Relative %: 47 %
PLATELETS: 327 10*3/uL (ref 140–400)
RBC: 4.18 MIL/uL (ref 3.80–5.10)
RDW: 13.2 % (ref 11.0–15.0)
WBC: 4.8 10*3/uL (ref 3.8–10.8)

## 2016-02-20 LAB — COMPLETE METABOLIC PANEL WITH GFR
ALT: 19 U/L (ref 6–29)
AST: 21 U/L (ref 10–30)
Albumin: 4.1 g/dL (ref 3.6–5.1)
Alkaline Phosphatase: 73 U/L (ref 33–115)
BILIRUBIN TOTAL: 1 mg/dL (ref 0.2–1.2)
BUN: 8 mg/dL (ref 7–25)
CHLORIDE: 103 mmol/L (ref 98–110)
CO2: 28 mmol/L (ref 20–31)
CREATININE: 0.93 mg/dL (ref 0.50–1.10)
Calcium: 9.2 mg/dL (ref 8.6–10.2)
GFR, EST NON AFRICAN AMERICAN: 83 mL/min (ref >=60)
GFR, Est African American: >89 mL/min (ref >=60)
GLUCOSE: 74 mg/dL (ref 65–99)
Potassium: 3.8 mmol/L (ref 3.5–5.3)
SODIUM: 137 mmol/L (ref 135–146)
TOTAL PROTEIN: 7.2 g/dL (ref 6.1–8.1)

## 2016-02-20 LAB — LIPID PANEL
Cholesterol: 148 mg/dL (ref 125–200)
HDL: 34 mg/dL — ABNORMAL LOW (ref >=46)
LDL Cholesterol: 92 mg/dL (ref <130)
Total CHOL/HDL Ratio: 4.4 Ratio (ref <=5.0)
Triglycerides: 108 mg/dL (ref <150)
VLDL: 22 mg/dL (ref <30)

## 2016-02-21 ENCOUNTER — Encounter: Payer: Self-pay | Admitting: Internal Medicine

## 2016-02-21 ENCOUNTER — Ambulatory Visit (INDEPENDENT_AMBULATORY_CARE_PROVIDER_SITE_OTHER): Payer: Medicaid Other | Admitting: Internal Medicine

## 2016-02-21 VITALS — BP 110/68 | HR 57 | Ht 64.0 in | Wt 102.8 lb

## 2016-02-21 DIAGNOSIS — F17210 Nicotine dependence, cigarettes, uncomplicated: Secondary | ICD-10-CM

## 2016-02-21 DIAGNOSIS — R590 Localized enlarged lymph nodes: Secondary | ICD-10-CM | POA: Diagnosis not present

## 2016-02-21 DIAGNOSIS — R053 Chronic cough: Secondary | ICD-10-CM

## 2016-02-21 DIAGNOSIS — R05 Cough: Secondary | ICD-10-CM

## 2016-02-21 LAB — T-HELPER CELL (CD4) - (RCID CLINIC ONLY)
CD4 % Helper T Cell: 46 % (ref 33–55)
CD4 T CELL ABS: 880 /uL (ref 400–2700)

## 2016-02-21 LAB — RPR

## 2016-02-21 MED ORDER — AMOXICILLIN-POT CLAVULANATE 875-125 MG PO TABS: 1.0000 | ORAL_TABLET | Freq: Two times a day (BID) | ORAL | 0 refills | Status: AC

## 2016-02-21 NOTE — Progress Notes (Signed)
Subjective:     Patient ID: [name], female   DOB: 05/06/1985,     MRN: [id number]  HPI   46 yobf  Active smoker HIV pos/ HD lymphoma last rx Oct 2016  With new   cough/sob x mid Oct 2017 > no better p sulfa for toe with abn ct/ pet scan so referred to pulmonary clinic 02/21/2016 by Dr  Alvy Bimler     02/21/2016 1st [facility] office visit/ [name]   Chief Complaint  Patient presents with  . Pulmonary Consult    Referred by Dr Alvy Bimler for eval of pulmonary lesion. Pt c/o cough with brown sputum, dysphagia and SOB for the past month.   sick x one month with cough in am's dark mucus green to brown. Took abx for skin infection ? Bactrim no better. Had assoc tooth ache with severe decay seen in upper molar on L rx with nsaids only an no assoc nasal/ sinus pain or purulent discharge - doe = MMRC1 = can walk nl pace, flat grade, can't hurry or go uphills or steps s sob  / no inhalers.  No obvious day to day or daytime variability or assoc   mucus plugs or hemoptysis or cp or chest tightness, subjective wheeze or overt sinus or hb symptoms. No unusual exp hx or h/o childhood pna/ asthma or knowledge of premature birth.  Sleeping ok without nocturnal  or early am exacerbation  of respiratory  c/o's or need for noct saba. Also denies any obvious fluctuation of symptoms with weather or environmental changes or other aggravating or alleviating factors except as outlined above   Current Medications, Allergies, Complete Past Medical History, Past Surgical History, Family History, and Social History were reviewed in [employer] record.  ROS  The following are not active complaints unless bolded sore throat, dysphagia, dental problems, itching, sneezing,  nasal congestion or excess/ purulent secretions, ear ache,   fever, chills, sweats, unintended wt loss x 7 lb since 06/2015 , classically pleuritic or exertional cp,  orthopnea pnd or leg swelling, presyncope, palpitations,  abdominal pain, anorexia, nausea, vomiting, diarrhea  or change in bowel or bladder habits, change in stools or urine, dysuria,hematuria,  rash, arthralgias, visual complaints, headache, numbness, weakness or ataxia or problems with walking or coordination,  change in mood/affect or memory.         Review of Systems     Objective:   Physical Exam    amb thin chronically ill bf nad  Wt Readings from Last 3 Encounters:  02/21/16 102 lb 12.8 oz (46.6 kg)  02/17/16 101 lb 11.2 oz (46.1 kg)  01/30/16 103 lb 11.2 oz (47 kg)  11/04/15            106  07/12/15             109  Vital signs reviewed  - Note on arrival 02 sats  100% on RA     HEENT: nl  turbinates, and oropharynx. Nl external ear canals without cough reflex- poor dentition with one bad molar for surgery Mar 24 2016   NECK :  without JVD/Nodes/TM/ nl carotid upstrokes bilaterally   LUNGS: no acc muscle use,  Nl contour chest which is clear to A and P bilaterally without cough on insp or exp maneuvers   CV:  RRR  no s3 or murmur or increase in P2, no edema   ABD:  soft and nontender with nl inspiratory excursion in the supine position.  No bruits or organomegaly, bowel sounds nl  MS:  Nl gait/ ext warm without deformities, calf tenderness, cyanosis or clubbing No obvious joint restrictions   SKIN: warm and dry without lesions    NEURO:  alert, approp, nl sensorium with  no motor deficits      I personally reviewed images and agree with radiology impression as follows:  PET 02/14/16 There is new focal hypermetabolic activity in the left hilum and AP window. This corresponds with probable adenopathy on the CT images, demonstrating an SUV max of 12.4 and 9.9, respectively. A small amount of pre-vascular activity is attributed to rebound thymic tissue. Low-level axillary activity is similar to the prior study. As above, there is prominent brown fat activity in the paraspinous regions. No suspicious pulmonary activity or  suspicious pulmonary nodules.      Assessment:

## 2016-02-21 NOTE — Patient Instructions (Addendum)
Augmentin 875 mg take one pill twice daily  X 10 days - take at breakfast and supper with large glass of water.  It would help reduce the usual side effects (diarrhea and yeast infections) if you ate cultured yogurt at lunch.   The key is to stop smoking completely before smoking completely stops you!   I will call you if there is a need for a lung biopsy but for now I don't see it.   You call me if your breathing or cough worsen after you quit smoking but at this point there is nothing better I can offer you than the recommendation you breathe clean air

## 2016-02-22 DIAGNOSIS — R590 Localized enlarged lymph nodes: Secondary | ICD-10-CM | POA: Insufficient documentation

## 2016-02-22 NOTE — Assessment & Plan Note (Signed)
See PET 02/14/16 with no assoc pulmonary mass/ nodules in setting of HD  I think it is extremely unlikely that this new finding in pt with known active HD represents an infectious process or second malignancy and would only consider EBUS ( which requires gen anesthesia) if progressing while other evidence of dx is actively regressing.   Discussed in detail all the  indications, usual  risks and alternatives  relative to the benefits with patient who agrees to proceed with rx for HD per oncology  Total time devoted to counseling  = 35/10m review case with pt/ discussion of options/alternatives/ personally creating written instructions  in presence of pt  then going over those specific  Instructions directly with the pt including how to use all of the meds but in particular covering each new medication in detail and the difference between the maintenance/automatic meds and the prns using an action plan format for the latter.

## 2016-02-22 NOTE — Assessment & Plan Note (Signed)

## 2016-02-22 NOTE — Assessment & Plan Note (Signed)
PFT's  07/04/14   Nl flows/volumes p no % improvement from saba p nothing  prior to study with DLCO  69/107c % corrects to 121  % for alv volume  She is clear on exam now but describes purulent secretions esp in am typical of CB from active smoking (see separate a/p)   She also though has severely decayed upper molar with pain controlled on nsaids but could have assoc  Dental abscess or assoc sinus infection so rec augmentin x 10 days then sinus CT next step  No roll for bronchodilators here yet

## 2016-02-24 LAB — HIV-1 RNA QUANT-NO REFLEX-BLD
HIV 1 RNA Quant: <20 copies/mL (ref <20)
HIV-1 RNA Quant, Log: <1.3 Log copies/mL (ref <1.30)

## 2016-03-17 ENCOUNTER — Other Ambulatory Visit: Payer: Self-pay | Admitting: *Deleted

## 2016-03-17 ENCOUNTER — Telehealth: Payer: Self-pay | Admitting: *Deleted

## 2016-03-17 DIAGNOSIS — C8118 Nodular sclerosis classical Hodgkin lymphoma, lymph nodes of multiple sites: Secondary | ICD-10-CM

## 2016-03-17 NOTE — Telephone Encounter (Signed)
-----   Message from Heath Lark, MD sent at 03/16/2016  9:19 AM EST ----- Regarding: FW: mutual patient Sorina Derrig, can you send refer to CT surgery? For LN dissection, please let her know ----- Message ----- From: Juanito Doom, MD Sent: 03/16/2016   2:52 AM To: Tanda Rockers, MD, Heath Lark, MD Subject: RE: mutual patient                             I could pass a needle into those nodes with EBUS, but I don't think pathology will be able to give [place] a diagnosis to sort out if lymphoma or not.  I've discussed this in the thoracic oncology meetings this year and no one at [facility] seems to think we can get a lymphoma diagnosis off of a needle biopsy.  Agree that lymph node resection is the best approach.   ----- Message ----- From: Tanda Rockers, MD Sent: 03/13/2016   1:16 PM To: Juanito Doom, MD, Heath Lark, MD Subject: RE: mutual patient                             EBUS is not likely to give you definitive dx of HD and mediastinoscopy doesn't do well in the L hilum/AP area.  Let me send this case to Dr Lake Bells for his opinio re EBUS  but you may be better off with surgical resection of one of the extrathoracic lymph nodes.   ----- Message ----- From: Heath Lark, MD Sent: 03/13/2016  12:30 PM To: Tanda Rockers, MD, Thayer Headings, MD Subject: mutual patient                                 Hi Dr. Melvyn Novas,  Patient could very well have relapsed Hodgkin but by protocol, we still need to obtain biopsy to make sure she did not transform to other forms of high grade lymphoma. I need tissue biopsy. Which route would you recommend?  Ni

## 2016-03-17 NOTE — Telephone Encounter (Signed)
LM for [name] that Dr Alvy Bimler has placed order for referral to [facility] for lymph node biopsy. Very important to determine what is going on so she can plan treatment.  To call if has questions

## 2016-03-17 NOTE — Telephone Encounter (Signed)
Called [facility] for referral.

## 2016-03-23 ENCOUNTER — Institutional Professional Consult (permissible substitution) (INDEPENDENT_AMBULATORY_CARE_PROVIDER_SITE_OTHER): Payer: Medicaid Other | Admitting: Thoracic Surgery (Cardiothoracic Vascular Surgery)

## 2016-03-23 ENCOUNTER — Encounter: Payer: Self-pay | Admitting: Thoracic Surgery (Cardiothoracic Vascular Surgery)

## 2016-03-23 VITALS — BP 104/69 | HR 72 | Resp 18 | Ht 64.0 in | Wt 102.0 lb

## 2016-03-23 DIAGNOSIS — B20 Human immunodeficiency virus [HIV] disease: Secondary | ICD-10-CM

## 2016-03-23 DIAGNOSIS — R590 Localized enlarged lymph nodes: Secondary | ICD-10-CM

## 2016-03-23 DIAGNOSIS — C8190 Hodgkin lymphoma, unspecified, unspecified site: Secondary | ICD-10-CM

## 2016-03-23 NOTE — Progress Notes (Signed)
PCP is Scharlene Gloss, MD Referring Provider is Comer, Okey Regal, MD/ Heath Lark, MD  Chief Complaint  Patient presents with  . Adenopathy     eval for lymph node biopsy, Hx of hodgkins lymphoma, PET Scan 02/14/16, CT C/A/P 01/29/16    HPI: A 30 year old woman sent for evaluation of adenopathy.  Ms. Gargas is a 30 year old woman with a history of HIV, Hodgkin's lymphoma treated with chemotherapy, HSV infection, AIN 3, condyloma acuminata, anemia, depression, bronchitis, and esophagitis. She was diagnosed with Hodgkin's lymphoma in April 2016. She was treated with chemotherapy which she completed in February 2017. She had a PET/CT in October which showed some left hilar and AP window adenopathy as well as retroperitoneal and pelvic adenopathy.  She originally presented with difficulty swallowing. That improved after chemotherapy. She has not had a good appetite and has lost about 5 pounds in the past 3 months. She's been feeling short of breath throughout the past month. She also complains of frequent urination and skin rash and itching. She denies any chest pain, pressure, or tightness  Zubrod Score: At the time of surgery this patient's most appropriate activity status/level should be described as: [x]     0    Normal activity, no symptoms []     1    Restricted in physical strenuous activity but ambulatory, able to do out light work []     2    Ambulatory and capable of self care, unable to do work activities, up and about >50 % of waking hours                              []     3    Only limited self care, in bed greater than 50% of waking hours []     4    Completely disabled, no self care, confined to bed or chair []     5    Moribund  Past Medical History:  Diagnosis Date  . AIN III (anal intraepithelial neoplasia III)   . Anemia   . Cancer ([place])    Hodgkin lymphoma  . Chest wall pain 06/27/2015  . Condyloma acuminatum in female   . Depression   . History of chronic bronchitis   .  History of esophagitis    CANDIDA  . History of shingles   . HIV (human immunodeficiency virus infection) ([place])   . Hodgkin's lymphoma ([place]) 06/12/2014  . HSV (herpes simplex virus) infection   . Hypokalemia 07/17/2014  . Periodontitis, chronic   . Screening examination for venereal disease 10/30/2013    Past Surgical History:  Procedure Laterality Date  . DIAGNOSTIC LAPAROSCOPY WITH REMOVAL OF ECTOPIC PREGNANCY N/A 11/17/2015   Procedure: LAPAROSCOPY LEFT  SALPINGECTOMY SECONDARY TO LEFT ECTOPIC PREGNANCY;  Surgeon: Jonnie Kind, MD;  Location: [facility];  Service: Gynecology;  Laterality: N/A;  . DILATION AND CURETTAGE OF UTERUS  2005   MISSED AB  . EXAMINATION UNDER ANESTHESIA N/A 09/23/2012   Procedure: EXAM UNDER ANESTHESIA;  Surgeon: Adin Hector, MD;  Location: [facility];  Service: General;  Laterality: N/A;  . LASER ABLATION CONDOLAMATA N/A 09/23/2012   Procedure: REMOVAL/ABLATION  ABLATION CONDOLAMATA WARTS;  Surgeon: Adin Hector, MD;  Location: [facility];  Service: General;  Laterality: N/A;    Family History  Problem Relation Age of Onset  . Cancer Maternal Aunt     unknown ca  . Cancer Maternal Grandmother  unknown ca    Social History Social History  Substance Use Topics  . Smoking status: Current Every Day Smoker    Packs/day: 0.50    Years: 7.00    Types: Cigars, Cigarettes    Start date: 03/19/2014  . Smokeless tobacco: Never Used     Comment: she smokes 3 Black and Mild Cigars daily  . Alcohol use 0.0 oz/week     Comment: Occasionally    Current Outpatient Prescriptions  Medication Sig Dispense Refill  . atazanavir (REYATAZ) 300 MG capsule Take 300 mg by mouth daily with breakfast.    . emtricitabine-tenofovir (TRUVADA) 200-300 MG tablet Take 1 tablet by mouth daily.    Marland Kitchen ibuprofen (ADVIL,MOTRIN) 200 MG tablet Take 400 mg by mouth every 6 (six) hours as needed for headache, mild pain or moderate pain.    .  ritonavir (NORVIR) 100 MG TABS tablet Take 100 mg by mouth daily with breakfast.    . traMADol (ULTRAM) 50 MG tablet Take 1 tablet (50 mg total) by mouth every 6 (six) hours as needed. for pain 90 tablet 0   No current facility-administered medications for this visit.     No Known Allergies  Review of Systems  Constitutional: Positive for appetite change and unexpected weight change. Negative for chills and fever.  HENT: Positive for trouble swallowing. Negative for voice change.   Eyes: Positive for visual disturbance (Blurry).  Respiratory: Positive for shortness of breath. Negative for cough and wheezing.   Cardiovascular: Negative for chest pain and leg swelling.  Gastrointestinal: Negative for abdominal distention, abdominal pain and blood in stool.  Genitourinary: Positive for frequency. Negative for dysuria.  Musculoskeletal: Negative for arthralgias and myalgias.  Skin: Positive for rash.  Neurological: Negative for syncope and weakness.       Memory problems  Hematological: Negative for adenopathy. Does not bruise/bleed easily.  Psychiatric/Behavioral: Positive for dysphoric mood.    BP 104/69   Pulse 72   Resp 18   Ht 5\' 4"  (1.626 m)   Wt 102 lb (46.3 kg)   LMP 01/23/2016   SpO2 98% Comment: RA  BMI 17.51 kg/m  Physical Exam  Constitutional: She is oriented to person, place, and time. She appears well-developed and well-nourished. No distress.  HENT:  Head: Normocephalic and atraumatic.  Mouth/Throat: No oropharyngeal exudate.  Eyes: Conjunctivae and EOM are normal. No scleral icterus.  Neck: Neck supple. No thyromegaly present.  Cardiovascular: Normal rate, regular rhythm and normal heart sounds.  Exam reveals no gallop and no friction rub.   No murmur heard. Pulmonary/Chest: Effort normal and breath sounds normal. No respiratory distress. She has no wheezes. She has no rales.  Abdominal: Soft. She exhibits no distension. There is no tenderness.   Musculoskeletal: She exhibits no edema.  Lymphadenopathy:    She has no cervical adenopathy.  Neurological: She is alert and oriented to person, place, and time. No cranial nerve deficit.  Skin: Skin is warm and dry.  Vitals reviewed.    Diagnostic Tests: CLINICAL DATA:  Subsequent treatment strategy for Hodgkin's lymphoma. Diagnosis in April 2016. Chemotherapy completed 8 months ago.  EXAM: NUCLEAR MEDICINE PET SKULL BASE TO THIGH  TECHNIQUE: 5.13 mCi F-18 FDG was injected intravenously. Full-ring PET imaging was performed from the skull base to thigh after the radiotracer. CT data was obtained and used for attenuation correction and anatomic localization.  FASTING BLOOD GLUCOSE:  Value: 94 mg/dl  COMPARISON:  Chest abdominal and pelvic CT 01/29/2016. PET-CT 07/11/2015 and 04/11/2015.  FINDINGS: NECK  Evaluation of the neck remains limited by prominent brown fat activity, especially in the supraclavicular and paraspinal regions. No definite hypermetabolic cervical lymph nodes identified. There is no suspicious activity within the pharyngeal mucosal space.  CHEST  There is new focal hypermetabolic activity in the left hilum and AP window. This corresponds with probable adenopathy on the CT images, demonstrating an SUV max of 12.4 and 9.9, respectively. A small amount of pre-vascular activity is attributed to rebound thymic tissue. Low-level axillary activity is similar to the prior study. As above, there is prominent brown fat activity in the paraspinous regions. No suspicious pulmonary activity or suspicious pulmonary nodules.  ABDOMEN/PELVIS  There is no hypermetabolic activity within the liver, adrenal glands, spleen or pancreas. The spleen is normal in size. There is new multifocal hypermetabolic retroperitoneal adenopathy. The individual nodes are difficult to measure on the noncontrast CT images, but include portacaval nodes (SUV max 7.8), right  external iliac nodes (SUV max 10.9) and nodes along the left pelvic sidewall (SUV max 15.3). Right ovarian dermoid appears unchanged.  SKELETON  There are areas of new focally increased hypermetabolic activity within the bones. At L3, the hypermetabolic activity is more diffuse compared with the prior study at which time it was localized the left aspect of the vertebral body. SUV max is currently 6.9 (previously 8.5). There is increased metabolic activity in the sacrum which is asymmetric to the left (SUV max 7.3). No evidence of new lytic lesion or pathologic fracture.  IMPRESSION: 1. Interval disease worsening with new foci of hypermetabolic activity in multiple retroperitoneal and pelvic lymph nodes as well as AP window and left hilar lymph nodes. (Deauville 5). 2. There is also overall worsening of the osseous disease.   Electronically Signed   By: Richardean Sale M.D.   On: 02/14/2016 13:46 I personally reviewed the PET/CT and concur with the findings as noted above.  Impression: 30 year old woman with a history of Hodgkin's lymphoma as well as HIV. She was treated with chemotherapy which she finished in February of this year. 8 months later a follow-up PET CT shows some left hilar and AP window adenopathy as well as bone involvement and pelvic and retroperitoneal adenopathy. All of her adenopathy is in relatively inaccessible locations.   Regarding the thoracic adenopathy, the AP window nodes are just at the very limit of mediastinoscopy and likely not accessible. They would be accessible with EBUS, but it's hard to make a diagnosis of lymphoma with needle aspirations. The hilar adenopathy with be accessible with VATS, but that's about the most painful way to try to make a diagnosis possible.  Before recommending approach to the thoracic nodes I want to review the PET CT with interventional radiology and general surgery to see if any of the pelvic or retroperitoneal nodes  would be accessible with less morbidity.  Plan: Will call patient after review with IR, general surgery  Melrose Nakayama, MD [facility] [phone]

## 2016-03-24 ENCOUNTER — Telehealth: Payer: Self-pay | Admitting: *Deleted

## 2016-03-24 ENCOUNTER — Other Ambulatory Visit: Payer: Self-pay | Admitting: Hematology and Oncology

## 2016-03-24 MED ORDER — TRAMADOL HCL 50 MG PO TABS: 50.0000 mg | ORAL_TABLET | Freq: Four times a day (QID) | ORAL | 0 refills | Status: DC | PRN

## 2016-03-24 NOTE — Telephone Encounter (Signed)
Rx for Tramadol Faxed to [facility] at [facility].

## 2016-03-24 NOTE — Telephone Encounter (Signed)
"  I need a refill for Ultram sent to my Pharmacy, [facility] on [address]

## 2016-03-26 ENCOUNTER — Other Ambulatory Visit: Payer: Self-pay | Admitting: *Deleted

## 2016-03-26 MED ORDER — TRAMADOL HCL 50 MG PO TABS: 50.0000 mg | ORAL_TABLET | Freq: Four times a day (QID) | ORAL | 0 refills | Status: DC | PRN

## 2016-03-31 ENCOUNTER — Ambulatory Visit (INDEPENDENT_AMBULATORY_CARE_PROVIDER_SITE_OTHER): Payer: Medicaid Other | Admitting: Internal Medicine

## 2016-03-31 VITALS — BP 134/76 | HR 80 | Temp 97.9°F | Wt 102.0 lb

## 2016-03-31 DIAGNOSIS — R17 Unspecified jaundice: Secondary | ICD-10-CM | POA: Diagnosis not present

## 2016-03-31 DIAGNOSIS — B20 Human immunodeficiency virus [HIV] disease: Secondary | ICD-10-CM | POA: Diagnosis present

## 2016-03-31 NOTE — Assessment & Plan Note (Signed)
In normal range now.  Is typical to be elevated in patients on Reyataz.  No issues.

## 2016-03-31 NOTE — Assessment & Plan Note (Signed)
Doing well and suppressed virus with good tolerance and compliance of medication. No changes in her regimen as she is doing well very hesitant to change, even Truvada to [place].   RTC 3 months

## 2016-03-31 NOTE — Progress Notes (Signed)
   Subjective:    Patient ID: [name], female    DOB: [birth year], 30 y.o.   MRN: [id number]  HPI She comes in for follow-up of HIV.  She has lymphoma and after now completing cheomotherapy and a PET/CT in October noted some adenopathy.  She is getting scheduled to have a biopsy by Dr. Roxan Hockey.  Has had some weight loss of about 3 lbs, but got 2 lbs back, eating ok, no diarrhea, no rashes.  Last CD4 880 in October and viral load <20.  Denies any missed doses.   Here with her mom today.     Review of Systems  Constitutional: Negative for appetite change, fatigue and fever.  Gastrointestinal: Negative for diarrhea and nausea.  Skin: Negative for rash.  Neurological: Negative for dizziness and light-headedness.       Objective:   Physical Exam  Constitutional: No distress.  HENT:  Mouth/Throat: No oropharyngeal exudate.  Eyes: No scleral icterus.  Cardiovascular: Normal rate, regular rhythm and normal heart sounds.   No murmur heard. Pulmonary/Chest: Effort normal and breath sounds normal. No respiratory distress. She has no wheezes.  Lymphadenopathy:    She has no cervical adenopathy.  Skin: No rash noted.  Psychiatric: She has a normal mood and affect.          Assessment & Plan:

## 2016-04-09 ENCOUNTER — Telehealth: Payer: Self-pay | Admitting: *Deleted

## 2016-04-09 NOTE — Telephone Encounter (Signed)
Patient called to ask if a pregnancy test could be added to her last lab work. It can not and advised if she thinks she may be pregnant she needs to be tested. She is going to see her PCP this morning and will ask for a pregnancy test. Advised if positive she should call the clinic back and I will notify Dr. Linus Salmons.

## 2016-04-10 ENCOUNTER — Other Ambulatory Visit: Payer: Self-pay | Admitting: *Deleted

## 2016-04-10 ENCOUNTER — Telehealth: Payer: Self-pay | Admitting: *Deleted

## 2016-04-10 DIAGNOSIS — R591 Generalized enlarged lymph nodes: Secondary | ICD-10-CM

## 2016-04-10 DIAGNOSIS — R599 Enlarged lymph nodes, unspecified: Secondary | ICD-10-CM

## 2016-04-10 NOTE — Telephone Encounter (Signed)
VM from [facility] with Dr. Roxan Hockey.  She says Dr. Roxan Hockey spoke w/ IR and they can do a retroperitoneal biopsy.  Their office will arrange/ schedule the biopsy. Dr. Roxan Hockey sent staff message via Epic to Dr. Alvy Bimler.

## 2016-04-10 NOTE — Telephone Encounter (Signed)
Dr. Alvy Bimler asking if Dr. Roxan Hockey can do biopsy?  I left msg w/ Thurmond Butts and she will ask Dr. Roxan Hockey aobut this case.

## 2016-04-18 ENCOUNTER — Emergency Department (HOSPITAL_COMMUNITY)
Admission: EM | Admit: 2016-04-18 | Discharge: 2016-04-18 | Disposition: A | Discharge status: Home / Self Care | Payer: Medicaid Other | Attending: Emergency Medicine | Admitting: Emergency Medicine

## 2016-04-18 ENCOUNTER — Encounter (HOSPITAL_COMMUNITY): Payer: Self-pay | Admitting: *Deleted

## 2016-04-18 DIAGNOSIS — R52 Pain, unspecified: Secondary | ICD-10-CM

## 2016-04-18 DIAGNOSIS — M79674 Pain in right toe(s): Secondary | ICD-10-CM | POA: Insufficient documentation

## 2016-04-18 DIAGNOSIS — F17210 Nicotine dependence, cigarettes, uncomplicated: Secondary | ICD-10-CM | POA: Diagnosis not present

## 2016-04-18 DIAGNOSIS — Z8571 Personal history of Hodgkin lymphoma: Secondary | ICD-10-CM | POA: Insufficient documentation

## 2016-04-18 LAB — COMPREHENSIVE METABOLIC PANEL
ALT: 23 U/L (ref 14–54)
ANION GAP: 7 (ref 5–15)
AST: 26 U/L (ref 15–41)
Albumin: 3.9 g/dL (ref 3.5–5.0)
Alkaline Phosphatase: 74 U/L (ref 38–126)
BUN: 8 mg/dL (ref 6–20)
CHLORIDE: 103 mmol/L (ref 101–111)
CO2: 28 mmol/L (ref 22–32)
Calcium: 9.6 mg/dL (ref 8.9–10.3)
Creatinine, Ser: 0.93 mg/dL (ref 0.44–1.00)
GFR calc Af Amer: >60 mL/min (ref >60)
GFR calc non Af Amer: >60 mL/min (ref >60)
GLUCOSE: 86 mg/dL (ref 65–99)
POTASSIUM: 4.4 mmol/L (ref 3.5–5.1)
Sodium: 138 mmol/L (ref 135–145)
Total Bilirubin: 1.5 mg/dL — ABNORMAL HIGH (ref 0.3–1.2)
Total Protein: 7.7 g/dL (ref 6.5–8.1)

## 2016-04-18 LAB — URINALYSIS, ROUTINE W REFLEX MICROSCOPIC
BILIRUBIN URINE: NEGATIVE
Glucose, UA: NEGATIVE mg/dL
Hgb urine dipstick: NEGATIVE
KETONES UR: NEGATIVE mg/dL
Nitrite: NEGATIVE
PH: 5 (ref 5.0–8.0)
PROTEIN: NEGATIVE mg/dL
Specific Gravity, Urine: 1.011 (ref 1.005–1.030)

## 2016-04-18 LAB — CBC WITH DIFFERENTIAL/PLATELET
Basophils Absolute: 0 10*3/uL (ref 0.0–0.1)
Basophils Relative: 1 %
EOS PCT: 1 %
Eosinophils Absolute: 0.1 10*3/uL (ref 0.0–0.7)
HCT: 39.3 % (ref 36.0–46.0)
Hemoglobin: 13.6 g/dL (ref 12.0–15.0)
LYMPHS ABS: 2.1 10*3/uL (ref 0.7–4.0)
LYMPHS PCT: 35 %
MCH: 33.7 pg (ref 26.0–34.0)
MCHC: 34.6 g/dL (ref 30.0–36.0)
MCV: 97.3 fL (ref 78.0–100.0)
MONO ABS: 0.5 10*3/uL (ref 0.1–1.0)
Monocytes Relative: 9 %
NEUTROS ABS: 3.3 10*3/uL (ref 1.7–7.7)
Neutrophils Relative %: 54 %
PLATELETS: 296 10*3/uL (ref 150–400)
RBC: 4.04 MIL/uL (ref 3.87–5.11)
RDW: 14 % (ref 11.5–15.5)
WBC: 6.1 10*3/uL (ref 4.0–10.5)

## 2016-04-18 LAB — I-STAT BETA HCG BLOOD, ED (MC, WL, AP ONLY)

## 2016-04-18 MED ORDER — IBUPROFEN 400 MG PO TABS
400.0000 mg | ORAL_TABLET | Freq: Once | ORAL | Status: AC | PRN
  Administered 2016-04-18: 400 mg via ORAL

## 2016-04-18 MED ORDER — IBUPROFEN 400 MG PO TABS
ORAL_TABLET | ORAL | Status: AC
  Filled 2016-04-18: qty 1

## 2016-04-18 MED ORDER — ALBUTEROL SULFATE HFA 108 (90 BASE) MCG/ACT IN AERS: 2.0000 | INHALATION_SPRAY | RESPIRATORY_TRACT | 0 refills | Status: DC | PRN

## 2016-04-18 NOTE — Discharge Instructions (Signed)
Take ibuprofen or Tylenol as needed for pain and body aches. Follow-up with your regular doctor in 1 week as needed. Use the albuterol inhaler that we are prescribing as needed for wheezing and shortness of breath. If symptoms significantly worsen or if you have any concerns you may can come back to the ED.

## 2016-04-18 NOTE — ED Triage Notes (Signed)
Pt states pain to cuticle of L great toe since her pedicure on Thurs when they removed an ingrown toenail.  Pt states body aches all over since she woke up this am.  States hx of lymphoma and is d/t have surgery to remove nodule to lung in Jan.  Denies any other s/s.
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
## 2016-04-22 ENCOUNTER — Telehealth: Payer: Self-pay | Admitting: Hematology and Oncology

## 2016-04-22 NOTE — Telephone Encounter (Signed)
lvm to inform pt of 1/18 appt date/time per LOS

## 2016-04-29 ENCOUNTER — Other Ambulatory Visit: Payer: Self-pay | Admitting: *Deleted

## 2016-04-29 DIAGNOSIS — R591 Generalized enlarged lymph nodes: Secondary | ICD-10-CM

## 2016-04-29 MED ORDER — TRAMADOL HCL 50 MG PO TABS: 50.0000 mg | ORAL_TABLET | Freq: Four times a day (QID) | ORAL | 0 refills | Status: DC | PRN

## 2016-05-01 ENCOUNTER — Other Ambulatory Visit: Payer: Self-pay | Admitting: Internal Medicine

## 2016-05-06 ENCOUNTER — Other Ambulatory Visit: Payer: Self-pay | Admitting: *Deleted

## 2016-05-06 MED ORDER — ATAZANAVIR SULFATE 300 MG PO CAPS: 300.0000 mg | ORAL_CAPSULE | Freq: Every day | ORAL | 5 refills | Status: DC

## 2016-05-08 ENCOUNTER — Other Ambulatory Visit: Payer: Self-pay | Admitting: Radiology

## 2016-05-08 ENCOUNTER — Other Ambulatory Visit: Payer: Self-pay | Admitting: General Surgery

## 2016-05-08 ENCOUNTER — Other Ambulatory Visit: Payer: Self-pay | Admitting: *Deleted

## 2016-05-08 MED ORDER — ATAZANAVIR SULFATE 300 MG PO CAPS: 300.0000 mg | ORAL_CAPSULE | Freq: Every day | ORAL | 5 refills | Status: DC

## 2016-05-11 ENCOUNTER — Ambulatory Visit (HOSPITAL_COMMUNITY)
Admission: RE | Admit: 2016-05-11 | Discharge: 2016-05-11 | Disposition: A | Discharge status: Home / Self Care | Payer: Medicaid Other | Source: Ambulatory Visit | Attending: Thoracic Surgery (Cardiothoracic Vascular Surgery) | Admitting: Thoracic Surgery (Cardiothoracic Vascular Surgery)

## 2016-05-11 DIAGNOSIS — R591 Generalized enlarged lymph nodes: Secondary | ICD-10-CM | POA: Diagnosis present

## 2016-05-11 DIAGNOSIS — R599 Enlarged lymph nodes, unspecified: Secondary | ICD-10-CM

## 2016-05-11 DIAGNOSIS — C8190 Hodgkin lymphoma, unspecified, unspecified site: Secondary | ICD-10-CM | POA: Insufficient documentation

## 2016-05-11 LAB — APTT: APTT: 31 s (ref 24–36)

## 2016-05-11 LAB — PROTIME-INR
INR: 1.1
PROTHROMBIN TIME: 14.3 s (ref 11.4–15.2)

## 2016-05-11 LAB — CBC
HCT: 38.4 % (ref 36.0–46.0)
HEMOGLOBIN: 13.5 g/dL (ref 12.0–15.0)
MCH: 34.5 pg — AB (ref 26.0–34.0)
MCHC: 35.2 g/dL (ref 30.0–36.0)
MCV: 98.2 fL (ref 78.0–100.0)
PLATELETS: 237 10*3/uL (ref 150–400)
RBC: 3.91 MIL/uL (ref 3.87–5.11)
RDW: 13.5 % (ref 11.5–15.5)
WBC: 8.3 10*3/uL (ref 4.0–10.5)

## 2016-05-11 LAB — PREGNANCY, URINE: Preg Test, Ur: NEGATIVE

## 2016-05-11 MED ORDER — FENTANYL CITRATE (PF) 100 MCG/2ML IJ SOLN
INTRAMUSCULAR | Status: AC
  Filled 2016-05-11: qty 2

## 2016-05-11 MED ORDER — SODIUM CHLORIDE 0.9 % IV SOLN: INTRAVENOUS | Status: DC

## 2016-05-11 MED ORDER — HYDROCODONE-ACETAMINOPHEN 5-325 MG PO TABS
1.0000 | ORAL_TABLET | ORAL | Status: DC | PRN
  Filled 2016-05-11: qty 2

## 2016-05-11 MED ORDER — LIDOCAINE HCL 1 % IJ SOLN
INTRAMUSCULAR | Status: AC
  Filled 2016-05-11: qty 20

## 2016-05-11 MED ORDER — MIDAZOLAM HCL 2 MG/2ML IJ SOLN
INTRAMUSCULAR | Status: AC | PRN
  Administered 2016-05-11 (×2): 1 mg via INTRAVENOUS

## 2016-05-11 MED ORDER — MIDAZOLAM HCL 2 MG/2ML IJ SOLN
INTRAMUSCULAR | Status: AC
  Filled 2016-05-11: qty 2

## 2016-05-11 MED ORDER — FENTANYL CITRATE (PF) 100 MCG/2ML IJ SOLN
INTRAMUSCULAR | Status: AC | PRN
  Administered 2016-05-11 (×2): 50 ug via INTRAVENOUS

## 2016-05-11 NOTE — Sedation Documentation (Signed)
Patient denies pain and is resting comfortably.  

## 2016-05-11 NOTE — Discharge Instructions (Signed)
Needle Biopsy, Care After °Introduction °Refer to this sheet in the next few weeks. These instructions provide you with information about caring for yourself after your procedure. Your health care provider may also give you more specific instructions. Your treatment has been planned according to current medical practices, but problems sometimes occur. Call your health care provider if you have any problems or questions after your procedure. °What can I expect after the procedure? °After your procedure, it is common to have soreness, bruising, or mild pain at the biopsy site. This should go away in a few days. °Follow these instructions at home: °· Rest as directed by your health care provider. °· Take medicines only as directed by your health care provider. °· There are many different ways to close and cover the biopsy site, including stitches (sutures), skin glue, and adhesive strips. Follow your health care provider's instructions about: °¨ Biopsy site care. °¨ Bandage (dressing) changes and removal. °¨ Biopsy site closure removal. °· Check your biopsy site every day for signs of infection. Watch for: °¨ Redness, swelling, or pain. °¨ Fluid, blood, or pus. °Contact a health care provider if: °· You have a fever. °· You have redness, swelling, or pain at the biopsy site that lasts longer than a few days. °· You have fluid, blood, or pus coming from the biopsy site. °· You feel nauseous. °· You vomit. °Get help right away if: °· You have shortness of breath. °· You have trouble breathing. °· You have chest pain. °· You feel dizzy or you faint. °· You have bleeding that does not stop with pressure or a bandage. °· You cough up blood. °· You have pain in your abdomen. °This information is not intended to replace advice given to you by your health care provider. Make sure you discuss any questions you have with your health care provider. °Document Released: 08/28/2014 Document Revised: 09/19/2015 Document Reviewed:  04/09/2014 °© 2017 Elsevier ° °

## 2016-05-11 NOTE — Sedation Documentation (Signed)
Patient is resting comfortably. 

## 2016-05-11 NOTE — H&P (Signed)
Chief Complaint: Patient was seen in consultation today for lymph node biopsy at the request of Hendrickson,Steven C  Referring Physician(s): Dayton C  Supervising Physician: Aletta Edouard  Patient Status: [facility] - Out-pt  History of Present Illness: [name] is a 31 y.o. female with hx of lymphoma. Diagnosed and treated in 2016. She now has findings of hypermetabolic retroperitoneal adenopathy. She is referred for biopsy PMHx, meds, labs, imaging reviewed. Has been NPO this am.  Past Medical History:  Diagnosis Date  . AIN III (anal intraepithelial neoplasia III)   . Anemia   . Cancer ([place])    Hodgkin lymphoma  . Chest wall pain 06/27/2015  . Condyloma acuminatum in female   . Depression   . History of chronic bronchitis   . History of esophagitis    CANDIDA  . History of shingles   . HIV (human immunodeficiency virus infection) ([place])   . Hodgkin's lymphoma ([place]) 06/12/2014  . HSV (herpes simplex virus) infection   . Hypokalemia 07/17/2014  . Periodontitis, chronic   . Screening examination for venereal disease 10/30/2013    Past Surgical History:  Procedure Laterality Date  . DIAGNOSTIC LAPAROSCOPY WITH REMOVAL OF ECTOPIC PREGNANCY N/A 11/17/2015   Procedure: LAPAROSCOPY LEFT  SALPINGECTOMY SECONDARY TO LEFT ECTOPIC PREGNANCY;  Surgeon: Jonnie Kind, MD;  Location: [facility];  Service: Gynecology;  Laterality: N/A;  . DILATION AND CURETTAGE OF UTERUS  2005   MISSED AB  . EXAMINATION UNDER ANESTHESIA N/A 09/23/2012   Procedure: EXAM UNDER ANESTHESIA;  Surgeon: Adin Hector, MD;  Location: [facility];  Service: General;  Laterality: N/A;  . LASER ABLATION CONDOLAMATA N/A 09/23/2012   Procedure: REMOVAL/ABLATION  ABLATION CONDOLAMATA WARTS;  Surgeon: Adin Hector, MD;  Location: [facility];  Service: General;  Laterality: N/A;    Allergies: Patient has no known allergies.  Medications: Prior to Admission  medications   Medication Sig Start Date End Date Taking? Authorizing Provider  albuterol (PROVENTIL HFA;VENTOLIN HFA) 108 (90 Base) MCG/ACT inhaler Inhale 2 puffs into the lungs every 4 (four) hours as needed for wheezing or shortness of breath. 04/18/16  Yes Tobie Poet, DO  atazanavir (REYATAZ) 300 MG capsule Take 1 capsule (300 mg total) by mouth daily with breakfast. 05/08/16  Yes Thayer Headings, MD  ibuprofen (ADVIL,MOTRIN) 200 MG tablet Take 400 mg by mouth every 6 (six) hours as needed for headache, mild pain or moderate pain.   Yes Historical Provider, MD  Naphazoline-Glycerin (CLEAR EYES MAX REDNESS RELIEF OP) Place 1 drop into both eyes 2 (two) times daily as needed.   Yes Historical Provider, MD  NORVIR 100 MG TABS tablet TAKE 1 TABLET BY MOUTH EVERY MORNING WITH BREAKFAST. TAKE WITH REYATAZ 05/01/16  Yes Campbell Riches, MD  traMADol (ULTRAM) 50 MG tablet Take 1 tablet (50 mg total) by mouth every 6 (six) hours as needed. for pain 04/29/16  Yes Heath Lark, MD  TRUVADA 200-300 MG tablet TAKE 1 TABLET BY MOUTH EVERY DAY 05/01/16  Yes Campbell Riches, MD     Family History  Problem Relation Age of Onset  . Cancer Maternal Aunt     unknown ca  . Cancer Maternal Grandmother     unknown ca    Social History   Social History  . Marital status: Single    Spouse name: N/A  . Number of children: N/A  . Years of education: N/A   Social History Main Topics  .  Smoking status: Current Every Day Smoker    Packs/day: 0.50    Years: 7.00    Types: Cigars, Cigarettes    Start date: 03/19/2014  . Smokeless tobacco: Never Used     Comment: she smokes 3 Black and Mild Cigars daily  . Alcohol use No     Comment: Occasionally  . Drug use:     Types: Marijuana     Comment: 2 blunts per day  . Sexual activity: Yes    Partners: Male    Birth control/ protection: None     Comment: pt. declined condoms   Other Topics Concern  . Not on file   Social History Narrative  . No narrative on  file     Review of Systems: A 12 point ROS discussed and pertinent positives are indicated in the HPI above.  All other systems are negative.  Review of Systems  Vital Signs: BP 104/72   Pulse 61   Temp 98.3 F (36.8 C) (Oral)   Resp 16   Ht 5\' 4"  (1.626 m)   Wt 104 lb (47.2 kg)   LMP 04/23/2016   SpO2 100%   BMI 17.85 kg/m   Physical Exam  Constitutional: She is oriented to person, place, and time. She appears well-developed and well-nourished. No distress.  HENT:  Head: Normocephalic.  Mouth/Throat: Oropharynx is clear and moist.  Neck: Normal range of motion. No tracheal deviation present.  Cardiovascular: Normal rate, regular rhythm and normal heart sounds.   Pulmonary/Chest: Effort normal and breath sounds normal. No respiratory distress.  Neurological: She is alert and oriented to person, place, and time.  Skin: Skin is warm and dry.  Psychiatric: She has a normal mood and affect. Judgment normal.    Mallampati Score:  MD Evaluation Airway: WNL Heart: WNL Abdomen: WNL Chest/ Lungs: WNL ASA  Classification: 2 Mallampati/Airway Score: One  Imaging: No results found.  Labs:  CBC:  Recent Labs  01/29/16 0901 02/20/16 0908 04/18/16 1342 05/11/16 0631  WBC 8.2 4.8 6.1 8.3  HGB 14.5 13.9 13.6 13.5  HCT 42.3 39.9 39.3 38.4  PLT 279 327 296 237    COAGS:  Recent Labs  07/23/15 0715 08/08/15 0750 05/11/16 0631  INR 1.15 1.05 1.10  APTT  --  31 31    BMP:  Recent Labs  08/08/15 0750  11/17/15 1217 01/29/16 0901 02/20/16 0908 04/18/16 1342  NA 142  < > 135 141 137 138  K 4.1  < > 3.7 3.4* 3.8 4.4  CL 105  --  104  --  103 103  CO2 28  < > 26 27 28 28   GLUCOSE 95  < > 86 96 74 86  BUN 9  < > 5* 8.8 8 8   CALCIUM 9.7  < > 9.1 9.7 9.2 9.6  CREATININE 0.80  < > 0.64 1.0 0.93 0.93  GFRNONAA >60  --  >60  --  83 >60  GFRAA >60  --  >60  --  >89 >60  < > = values in this interval not displayed.  LIVER FUNCTION TESTS:  Recent Labs   11/17/15 1217 01/29/16 0901 02/20/16 0908 04/18/16 1342  BILITOT 1.6* 2.32* 1.0 1.5*  AST 17 16 21 26   ALT 14 14 19 23   ALKPHOS 70 92 73 74  PROT 7.2 7.8 7.2 7.7  ALBUMIN 4.1 3.9 4.1 3.9    TUMOR MARKERS: No results for input(s): AFPTM, CEA, CA199, CHROMGRNA in the last 8760 hours.  Assessment and Plan: Hx of Lymphoma New hypermetabolic retroperitoneal adenopathy For CT guided biopsy. Labs ok Risks and Benefits discussed with the patient including, but not limited to bleeding, infection, damage to adjacent structures or low yield requiring additional tests. All of the patient's questions were answered, patient is agreeable to proceed. Consent signed and in chart.    Thank you for this interesting consult.   A copy of this report was sent to the requesting provider on this date.  Electronically Signed: Ascencion Dike 05/11/2016, 7:31 AM   I spent a total of 20 minutes in face to face in clinical consultation, greater than 50% of which was counseling/coordinating care for lymph node biopsy

## 2016-05-11 NOTE — Procedures (Signed)
Interventional Radiology Procedure Note  Procedure:  CT guided core biopsy of left RP lymph node  Complications: None  Estimated Blood Loss: < 10 mL  Left para-aortic LN inferior to left renal vein targeted.  18 G core biopsy x 3 via 17 G needle. Core biopsy samples submitted in saline.  Venetia Night. Kathlene Cote, M.D Pager:  [phone]

## 2016-05-14 ENCOUNTER — Ambulatory Visit: Payer: Medicaid Other | Admitting: Hematology and Oncology

## 2016-05-14 ENCOUNTER — Telehealth: Payer: Self-pay | Admitting: *Deleted

## 2016-05-14 DIAGNOSIS — R591 Generalized enlarged lymph nodes: Secondary | ICD-10-CM

## 2016-05-14 MED ORDER — TRAMADOL HCL 50 MG PO TABS: 50.0000 mg | ORAL_TABLET | Freq: Four times a day (QID) | ORAL | 0 refills | Status: DC | PRN

## 2016-05-14 NOTE — Telephone Encounter (Signed)
I placed scheduling msg to see her 1/23

## 2016-05-14 NOTE — Telephone Encounter (Signed)
Called in refill on Tramadol to [facility].

## 2016-05-14 NOTE — Telephone Encounter (Signed)
Pt called states she cannot make her appt today due to weather.  She asks if she can be rescheduled.  She also asks if we can call in refill on Tramadol to her pharmacy?

## 2016-05-15 ENCOUNTER — Telehealth: Payer: Self-pay | Admitting: Hematology and Oncology

## 2016-05-15 NOTE — Telephone Encounter (Signed)
Confirm 1/23 appt date/time with pt per LOS

## 2016-05-19 ENCOUNTER — Telehealth: Payer: Self-pay

## 2016-05-19 ENCOUNTER — Encounter: Payer: Self-pay | Admitting: Hematology and Oncology

## 2016-05-19 ENCOUNTER — Ambulatory Visit (HOSPITAL_BASED_OUTPATIENT_CLINIC_OR_DEPARTMENT_OTHER): Payer: Medicaid Other | Admitting: Hematology and Oncology

## 2016-05-19 VITALS — BP 89/51 | HR 64 | Temp 98.6°F | Resp 18 | Ht 64.0 in | Wt 106.1 lb

## 2016-05-19 DIAGNOSIS — M79604 Pain in right leg: Secondary | ICD-10-CM | POA: Diagnosis not present

## 2016-05-19 DIAGNOSIS — M79605 Pain in left leg: Secondary | ICD-10-CM | POA: Diagnosis not present

## 2016-05-19 DIAGNOSIS — R591 Generalized enlarged lymph nodes: Secondary | ICD-10-CM

## 2016-05-19 DIAGNOSIS — B20 Human immunodeficiency virus [HIV] disease: Secondary | ICD-10-CM | POA: Diagnosis not present

## 2016-05-19 DIAGNOSIS — C8118 Nodular sclerosis classical Hodgkin lymphoma, lymph nodes of multiple sites: Secondary | ICD-10-CM

## 2016-05-19 MED ORDER — TRAMADOL HCL 50 MG PO TABS: 50.0000 mg | ORAL_TABLET | ORAL | 0 refills | Status: DC | PRN

## 2016-05-19 MED ORDER — HYDROMORPHONE HCL 2 MG PO TABS: 2.0000 mg | ORAL_TABLET | Freq: Four times a day (QID) | ORAL | 0 refills | Status: DC | PRN

## 2016-05-19 NOTE — Assessment & Plan Note (Signed)
She has chronic wrist pain and diffuse musculoskeletal pain of unknown etiology. Examination is benign. I recommend vitamin D supplement. I refilled her prescription tramadol today. 

## 2016-05-19 NOTE — Progress Notes (Signed)
[facility] OFFICE PROGRESS NOTE  Patient Care Team: Robert W Comer, MD as PCP - General (Internal Medicine) Robert W Comer, MD as PCP - Infectious Diseases (Infectious Diseases) James G Arnold, MD as Consulting Physician (Obstetrics and Gynecology) Michael B Wert, MD as Consulting Physician (Pulmonary Disease) Steven C Hendrickson, MD as Consulting Physician (Cardiothoracic Surgery)  SUMMARY OF ONCOLOGIC HISTORY:   Hodgkin lymphoma, nodular sclerosis (HCC)   05/06/2014 Imaging    CT scan of the abdomen show diffuse mesenteric lymphadenopathy.      05/07/2014 Imaging    CT scan of the chest show right thoracic inlet lymphadenopathy      06/07/2014 Procedure    She underwent ultrasound-guided core biopsy of the neck lymph node      06/07/2014 Pathology Results    Accession: [id number] biopsy confirmed diagnosis of Hodgkin lymphoma.      06/15/2014 Imaging    Echocardiogram showed preserved ejection fraction      07/09/2014 - 07/12/2014 Hospital Admission    She was admitted to the hospital for severe anemia.      07/27/2014 Procedure    She had placement of port      07/31/2014 - 09/11/2014 Chemotherapy    She received dose adjusted chemotherapy due to abnormal liver function tests and severe anemia. Treatment was delayed due to noncompliance  and subsequently stopped because the patient failed to keep appointments      01/11/2015 Imaging    Repeat PET CT scan showed response to treatment      01/28/2015 - 06/18/2015 Chemotherapy    ABVD was restarted with full dose.      02/08/2015 - 02/10/2015 Hospital Admission    The patient was admitted to the hospital due to pancytopenia and profuse diarrhea. Cultures were negative. She was placed on ciprofloxacin.      02/11/2015 Adverse Reaction    Treatment was placed on hold due to recent infection.      04/11/2015 Imaging    PET CT scan showed near complete response. Incidental finding of an abnormal bone lesion,  indeterminate. She is not symptomatic. Recommendation from Hem [facility] to observe      07/11/2015 Imaging    PET CT scan showed abnormal new bone lesions, suggestive of possible disease progression      07/23/2015 Bone Marrow Biopsy    She underwent bone biopsy      07/23/2015 Pathology Results    Accession: [id number]  biopsy was negative for cancer      11/21/2015 Surgery    She had surgery for ectopic pregnancy      01/29/2016 Imaging    Ct chest, abdomen and pelvis showed pelvic and retroperitoneal lymphadenopathy, as above, concerning for residual disease. There is also a mildly enlarged posterior mediastinal lymph node measuring 11 mm adjacent to the distal descending thoracic aorta. This may represent an additional focus of disease, but is the only finding of concern in the thorax on today's examination. Sclerosis in the right ilium at site of previously noted metabolically active lesion, grossly unchanged. No other definite osseous lesions are identified on today's examination. Spleen is normal in size and appearance.      02/14/2016 PET scan    Interval disease worsening with new foci of hypermetabolic activity in multiple retroperitoneal and pelvic lymph nodes as well as AP window and left hilar lymph nodes. (Deauville 5). There is also overall worsening of the osseous disease.      05/11/2016 Pathology Results      Diagnosis Lymph node, needle/core biopsy, Left para-aortic retroperitoneal - CLASSICAL HODGKIN LYMPHOMA. - SEE ONCOLOGY TABLE. Microscopic Comment LYMPHOMA Histologic type: Classical Hodgkin lymphoma. Grade (if applicable): N/A Flow cytometry: Not done. Immunohistochemical stains: CD15, CD20, CD3, LCA, PAX-5, CD30 with appropriate controls. Touch preps/imprints: Not performed. Comments: The sections show small needle core biopsy fragments displaying a polymorphous cellular proliferation of small lymphocytes, plasma cells, eosinophils, and large atypical mononuclear and  multilobated lymphoid cells with features of Reed-Sternberg cells and variants. This is associated with patchy areas of fibrosis. Immunohistochemical stains were performed and show that the large atypical lymphoid cells are positive for CD30, CD15 and PAX-5 and negative for LCA, CD20, CD3. The small lymphoid cells in the background show a mixture of T and B cells with predominance of T cells. The overall morphologic and histologic features are consistent with classical Hodgkin lymphoma. Further subtyping is challenging in limited small biopsy fragments but the patchy fibrosis suggests nodular sclerosis type.       05/11/2016 Procedure    She underwent CT guided biopsy of retroperitoneal lymph node       INTERVAL HISTORY: Please see below for problem oriented charting. She returns with her friend for follow-up She continues to have diffuse musculoskeletal pain She denies new lymphadenopathy She tolerated recent biopsy well  REVIEW OF SYSTEMS:   Constitutional: Denies fevers, chills or abnormal weight loss Eyes: Denies blurriness of vision Ears, nose, mouth, throat, and face: Denies mucositis or sore throat Respiratory: Denies cough, dyspnea or wheezes Cardiovascular: Denies palpitation, chest discomfort or lower extremity swelling Gastrointestinal:  Denies nausea, heartburn or change in bowel habits Skin: Denies abnormal skin rashes Lymphatics: Denies new lymphadenopathy or easy bruising Neurological:Denies numbness, tingling or new weaknesses Behavioral/Psych: Mood is stable, no new changes  All other systems were reviewed with the patient and are negative.  I have reviewed the past medical history, past surgical history, social history and family history with the patient and they are unchanged from previous note.  ALLERGIES:  has No Known Allergies.  MEDICATIONS:  Current Outpatient Prescriptions  Medication Sig Dispense Refill  . albuterol (PROVENTIL HFA;VENTOLIN HFA) 108 (90  Base) MCG/ACT inhaler Inhale 2 puffs into the lungs every 4 (four) hours as needed for wheezing or shortness of breath. 1 Inhaler 0  . atazanavir (REYATAZ) 300 MG capsule Take 1 capsule (300 mg total) by mouth daily with breakfast. 30 capsule 5  . ibuprofen (ADVIL,MOTRIN) 200 MG tablet Take 400 mg by mouth every 6 (six) hours as needed for headache, mild pain or moderate pain.    . Naphazoline-Glycerin (CLEAR EYES MAX REDNESS RELIEF OP) Place 1 drop into both eyes 2 (two) times daily as needed.    . NORVIR 100 MG TABS tablet TAKE 1 TABLET BY MOUTH EVERY MORNING WITH BREAKFAST. TAKE WITH REYATAZ 30 tablet 5  . traMADol (ULTRAM) 50 MG tablet Take 1 tablet (50 mg total) by mouth every 4 (four) hours as needed. for pain 90 tablet 0  . TRUVADA 200-300 MG tablet TAKE 1 TABLET BY MOUTH EVERY DAY 30 tablet 5  . HYDROmorphone (DILAUDID) 2 MG tablet Take 1 tablet (2 mg total) by mouth every 6 (six) hours as needed for severe pain. 40 tablet 0   No current facility-administered medications for this visit.     PHYSICAL EXAMINATION: ECOG PERFORMANCE STATUS: 1 - Symptomatic but completely ambulatory  Vitals:   05/19/16 0913  BP: (!) 89/51  Pulse: 64  Resp: 18  Temp: 98.6 F (37 C)     Filed Weights   05/19/16 0913  Weight: 106 lb 1.6 oz (48.1 kg)    GENERAL:alert, no distress and comfortable SKIN: skin color, texture, turgor are normal, no rashes or significant lesions EYES: normal, Conjunctiva are pink and non-injected, sclera clear OROPHARYNX:no exudate, no erythema and lips, buccal mucosa, and tongue normal  NECK: supple, thyroid normal size, non-tender, without nodularity LYMPH:  no palpable lymphadenopathy in the cervical, axillary or inguinal LUNGS: clear to auscultation and percussion with normal breathing effort HEART: regular rate & rhythm and no murmurs and no lower extremity edema ABDOMEN:abdomen soft, non-tender and normal bowel sounds Musculoskeletal:no cyanosis of digits and no  clubbing  NEURO: alert & oriented x 3 with fluent speech, no focal motor/sensory deficits  LABORATORY DATA:  I have reviewed the data as listed    Component Value Date/Time   NA 138 04/18/2016 1342   NA 141 01/29/2016 0901   K 4.4 04/18/2016 1342   K 3.4 (L) 01/29/2016 0901   CL 103 04/18/2016 1342   CO2 28 04/18/2016 1342   CO2 27 01/29/2016 0901   GLUCOSE 86 04/18/2016 1342   GLUCOSE 96 01/29/2016 0901   BUN 8 04/18/2016 1342   BUN 8.8 01/29/2016 0901   CREATININE 0.93 04/18/2016 1342   CREATININE 0.93 02/20/2016 0908   CREATININE 1.0 01/29/2016 0901   CALCIUM 9.6 04/18/2016 1342   CALCIUM 9.7 01/29/2016 0901   PROT 7.7 04/18/2016 1342   PROT 7.8 01/29/2016 0901   ALBUMIN 3.9 04/18/2016 1342   ALBUMIN 3.9 01/29/2016 0901   AST 26 04/18/2016 1342   AST 16 01/29/2016 0901   ALT 23 04/18/2016 1342   ALT 14 01/29/2016 0901   ALKPHOS 74 04/18/2016 1342   ALKPHOS 92 01/29/2016 0901   BILITOT 1.5 (H) 04/18/2016 1342   BILITOT 2.32 (H) 01/29/2016 0901   GFRNONAA >60 04/18/2016 1342   GFRNONAA 83 02/20/2016 0908   GFRAA >60 04/18/2016 1342   GFRAA >89 02/20/2016 0908    No results found for: SPEP, UPEP  Lab Results  Component Value Date   WBC 8.3 05/11/2016   NEUTROABS 3.3 04/18/2016   HGB 13.5 05/11/2016   HCT 38.4 05/11/2016   MCV 98.2 05/11/2016   PLT 237 05/11/2016      Chemistry      Component Value Date/Time   NA 138 04/18/2016 1342   NA 141 01/29/2016 0901   K 4.4 04/18/2016 1342   K 3.4 (L) 01/29/2016 0901   CL 103 04/18/2016 1342   CO2 28 04/18/2016 1342   CO2 27 01/29/2016 0901   BUN 8 04/18/2016 1342   BUN 8.8 01/29/2016 0901   CREATININE 0.93 04/18/2016 1342   CREATININE 0.93 02/20/2016 0908   CREATININE 1.0 01/29/2016 0901      Component Value Date/Time   CALCIUM 9.6 04/18/2016 1342   CALCIUM 9.7 01/29/2016 0901   ALKPHOS 74 04/18/2016 1342   ALKPHOS 92 01/29/2016 0901   AST 26 04/18/2016 1342   AST 16 01/29/2016 0901   ALT 23  04/18/2016 1342   ALT 14 01/29/2016 0901   BILITOT 1.5 (H) 04/18/2016 1342   BILITOT 2.32 (H) 01/29/2016 0901       RADIOGRAPHIC STUDIES: I have personally reviewed the radiological images as listed and agreed with the findings in the report. Ct Biopsy  Result Date: 05/11/2016 CLINICAL DATA:  History of treated classical Hodgkin's lymphoma. CT and PET scan now show evidence of disease recurrence/worsening with hypermetabolic mediastinal lymphadenopathy in the chest and  retroperitoneal lymphadenopathy in the abdomen and pelvis. Para-aortic retroperitoneal adenopathy is down targeted for further tissue sampling. EXAM: CT GUIDED CORE BIOPSY OF RETROPERITONEAL ABDOMINAL LYMPH NODE COMPARISON:  CT of the abdomen and pelvis on 01/29/2016 and PET scan on 02/14/2016. ANESTHESIA/SEDATION: 2.0 mg IV Versed; 100 mcg IV Fentanyl Total Moderate Sedation Time:  20 minutes. The patient's level of consciousness and physiologic status were continuously monitored during the procedure by Radiology nursing. PROCEDURE: The procedure risks, benefits, and alternatives were explained to the patient. Questions regarding the procedure were encouraged and answered. The patient understands and consents to the procedure. A time-out was performed prior to initiating the procedure. The left posterior translumbar region was prepped with chlorhexidine in a sterile fashion, and a sterile drape was applied covering the operative field. A sterile gown and sterile gloves were used for the procedure. Local anesthesia was provided with 1% Lidocaine. CT was performed through the abdomen in a prone position. Under CT guidance, a 17 gauge trocar needle was advanced into the left retroperitoneum at the level of a a para-aortic lymph node. Core biopsy was performed with an 18 gauge device. Three separate core biopsy samples were submitted in saline. COMPLICATIONS: None FINDINGS: The most accessible retroperitoneal lymph node based on most recent  imaging is to the left of the aorta and inferior to the left renal vein. This lymph node was targeted with solid tissue obtained. There was no immediate CT evidence of bleeding complication. The procedure was well tolerated. IMPRESSION: CT-guided core biopsy performed of left para-aortic lymphadenopathy. Electronically Signed   By: Glenn  Yamagata M.D.   On: 05/11/2016 09:45    ASSESSMENT & PLAN:  Hodgkin lymphoma, nodular sclerosis (HCC) Unfortunately, recent biopsy confirmed disease relapse I am disappointed it took 3 months for the biopsy to be done I plan to repeat PET CT scan, port placement, chemotherapy education class, blood draw next week  I plan to start cycle 1 of ifosfamide, carboplatin and etoposide on 06/01/2016 She would benefit from bone marrow transplant evaluation For fertility preservation, I will start her on Lupron injection next week I will see her back next week for chemotherapy consent  Human immunodeficiency virus (HIV) disease (HCC) Her last HIV viral load was undetectable. She will continue anti-retroviral treatment  Bilateral leg pain She has chronic wrist pain and diffuse musculoskeletal painof unknown etiology. Examination is benign. I recommend vitamin D supplement. I refilled her prescription tramadol today.   Orders Placed This Encounter  Procedures  . NM PET Image Restag (PS) Skull Base To Thigh    Standing Status:   Future    Standing Expiration Date:   07/19/2017    Order Specific Question:   Reason for Exam (SYMPTOM  OR DIAGNOSIS REQUIRED)    Answer:   staging lymphoma    Order Specific Question:   Is the patient pregnant?    Answer:   No    Order Specific Question:   Preferred imaging location?    Answer:   [facility]  . DG Fluoro Guide CV Line Left    Standing Status:   Future    Standing Expiration Date:   07/17/2017    Order Specific Question:   Reason for Exam (SYMPTOM  OR DIAGNOSIS REQUIRED)    Answer:   need Iv for chemo  access    Order Specific Question:   Is the patient pregnant?    Answer:   No    Order Specific Question:   Preferred imaging location?      Answer:   [facility]  . Comprehensive metabolic panel    Standing Status:   Future    Standing Expiration Date:   06/23/2017  . CBC with Differential/Platelet    Standing Status:   Future    Standing Expiration Date:   06/23/2017  . Lactate dehydrogenase (LDH)    Standing Status:   Future    Standing Expiration Date:   06/23/2017  . Uric acid    Standing Status:   Future    Standing Expiration Date:   06/23/2017  . Pregnancy, urine    Standing Status:   Future    Standing Expiration Date:   05/19/2017  . Vitamin D 25 hydroxy    Standing Status:   Future    Standing Expiration Date:   05/19/2017   All questions were answered. The patient knows to call the clinic with any problems, questions or concerns. No barriers to learning was detected. I spent 40 minutes counseling the patient face to face. The total time spent in the appointment was 55 minutes and more than 50% was on counseling and review of test results     Heath Lark, MD 05/19/2016 10:23 AM

## 2016-05-19 NOTE — Assessment & Plan Note (Signed)
Unfortunately, recent biopsy confirmed disease relapse I am disappointed it took 3 months for the biopsy to be done I plan to repeat PET CT scan, port placement, chemotherapy education class, blood draw next week  I plan to start cycle 1 of ifosfamide, carboplatin and etoposide on 06/01/2016 She would benefit from bone marrow transplant evaluation For fertility preservation, I will start her on Lupron injection next week I will see her back next week for chemotherapy consent

## 2016-05-19 NOTE — Progress Notes (Signed)
Patient on plan of care prior to pathways. 

## 2016-05-19 NOTE — Assessment & Plan Note (Signed)
Her last HIV viral load was undetectable. She will continue anti-retroviral treatment 

## 2016-05-19 NOTE — Progress Notes (Signed)
START ON PATHWAY REGIMEN - Lymphoma and CLL  [id number]: ICE (CIV ifosfamide) q21 Days (Inpatient) x 2-4 Cycles Followed by Referral to Transplant   A cycle is every 21 days:     Etoposide (Toposar(R)) 100 mg/m2 in NS IV over 2 hours on days 1, 2, and 3. Concentration must be less than 0.4 mg/mL to prevent precipitation. Dose Mod: None     Carboplatin (Paraplatin(R)) AUC=5 in 250 mL NS IV over 1 hour on day 2 only. * Maximum dose = 750 mg. Dose Mod: None     Ifosfamide (Ifex(R)) 5 gm/m2 in 500 mL NS CIV over 24 hours on day 2 only- mixed in same bag with mesna. Dose Mod: None     Mesna (Mesnex(R)) 5 gm/m2 in 500 ml NS CIV over 24 hours on day 2 only - mixed in same bag with ifosfamide. Dose Mod: None     Pegfilgrastim (Neulasta(R)) 6 mg flat dose subcutaneously on day 5. ** Dose Mod: None Additional Orders: Daily urinalysis while receiving ifosfamide. *Per FDA, cap GFR at 125 in Calvert equation. **G-CSF recommended due to data showing a >20% risk of febrile neutropenia. If stem cells will be collected, use filgrastim instead at 10  mcg/kg/day starting on D5 through leukopheresis. Early studies allowed cycles to repeat q14d if counts permitted; however, there was a reported high rate of hematologic toxicity resulting in treatment delays. The more recent phase III CORAL study  followed q21d cycle intervals. Ref: Fatima Blank al. Ann Oncol 2003;14:i5.; Gisselbrecht et al. JCO. 2010; 28(27):4184.  **Always confirm dose/schedule in your pharmacy ordering system**    Patient Characteristics: Classic Hodgkin Lymphoma, Second Line, Transplant Candidate Disease Type: Classic Hodgkin Lymphoma Disease Type: Not Applicable Line of therapy: Second Line [place] Stage: IVA Patient Characteristics: Transplant Candidate  Intent of Therapy: Curative Intent, Discussed with Patient

## 2016-05-19 NOTE — Progress Notes (Unsigned)
Planned admission for 06/01/2016 for inpatient chemo (ICE regimen). Staff message sent to [employer]. Notified [place] on [facility], [place] in [place] Pharmacy, and [place] in Patient Placement.

## 2016-05-21 NOTE — Telephone Encounter (Signed)
IR to schedule port placement.

## 2016-05-22 ENCOUNTER — Telehealth: Payer: Self-pay | Admitting: Hematology and Oncology

## 2016-05-22 ENCOUNTER — Other Ambulatory Visit: Payer: Self-pay | Admitting: Hematology and Oncology

## 2016-05-22 NOTE — Telephone Encounter (Signed)
lvm to inform pt of 1/31 appt at 930 am per LOS.

## 2016-05-24 ENCOUNTER — Other Ambulatory Visit: Payer: Self-pay | Admitting: Radiology

## 2016-05-25 ENCOUNTER — Other Ambulatory Visit: Payer: Self-pay | Admitting: Radiology

## 2016-05-25 ENCOUNTER — Telehealth: Payer: Self-pay | Admitting: *Deleted

## 2016-05-25 NOTE — Telephone Encounter (Signed)
Pt notified of PET scan on 2/6 @ 0630, NPO after MN  Next appts @ [place] will be on 2/6 as well. Lab, injection chemo class and Dr Alvy Bimler  Schedule message sent

## 2016-05-25 NOTE — Telephone Encounter (Signed)
"  I received a call about appointment tomorrow and appointment Wednesday.  I'm supposed to have surgery for port-a-cath placement" Reviewed appointment for port placement tomorrow.  Lab, MD F/U and Lupron injection Wednesday and Chemotherapy Injection Thursday.  No further questions.

## 2016-05-26 ENCOUNTER — Ambulatory Visit (HOSPITAL_COMMUNITY)
Admission: RE | Admit: 2016-05-26 | Discharge: 2016-05-26 | Disposition: A | Discharge status: Home / Self Care | Payer: Medicaid Other | Source: Ambulatory Visit | Attending: Hematology and Oncology | Admitting: Hematology and Oncology

## 2016-05-26 ENCOUNTER — Encounter (HOSPITAL_COMMUNITY): Payer: Self-pay

## 2016-05-26 ENCOUNTER — Other Ambulatory Visit: Payer: Self-pay | Admitting: Hematology and Oncology

## 2016-05-26 DIAGNOSIS — K0530 Chronic periodontitis, unspecified: Secondary | ICD-10-CM | POA: Insufficient documentation

## 2016-05-26 DIAGNOSIS — F329 Major depressive disorder, single episode, unspecified: Secondary | ICD-10-CM | POA: Diagnosis not present

## 2016-05-26 DIAGNOSIS — Z8619 Personal history of other infectious and parasitic diseases: Secondary | ICD-10-CM | POA: Diagnosis not present

## 2016-05-26 DIAGNOSIS — B20 Human immunodeficiency virus [HIV] disease: Secondary | ICD-10-CM | POA: Diagnosis not present

## 2016-05-26 DIAGNOSIS — C8110 Nodular sclerosis classical Hodgkin lymphoma, unspecified site: Secondary | ICD-10-CM | POA: Diagnosis present

## 2016-05-26 DIAGNOSIS — A630 Anogenital (venereal) warts: Secondary | ICD-10-CM | POA: Insufficient documentation

## 2016-05-26 DIAGNOSIS — D630 Anemia in neoplastic disease: Secondary | ICD-10-CM | POA: Insufficient documentation

## 2016-05-26 DIAGNOSIS — C8118 Nodular sclerosis classical Hodgkin lymphoma, lymph nodes of multiple sites: Secondary | ICD-10-CM

## 2016-05-26 HISTORY — PX: IR GENERIC HISTORICAL: IMG1180011

## 2016-05-26 LAB — CBC WITH DIFFERENTIAL/PLATELET
BASOS ABS: 0 10*3/uL (ref 0.0–0.1)
Basophils Relative: 0 %
Eosinophils Absolute: 0.1 10*3/uL (ref 0.0–0.7)
Eosinophils Relative: 1 %
HCT: 39.4 % (ref 36.0–46.0)
Hemoglobin: 14.1 g/dL (ref 12.0–15.0)
LYMPHS ABS: 1.8 10*3/uL (ref 0.7–4.0)
LYMPHS PCT: 31 %
MCH: 34.6 pg — AB (ref 26.0–34.0)
MCHC: 35.8 g/dL (ref 30.0–36.0)
MCV: 96.8 fL (ref 78.0–100.0)
Monocytes Absolute: 0.6 10*3/uL (ref 0.1–1.0)
Monocytes Relative: 10 %
NEUTROS ABS: 3.3 10*3/uL (ref 1.7–7.7)
Neutrophils Relative %: 58 %
Platelets: 263 10*3/uL (ref 150–400)
RBC: 4.07 MIL/uL (ref 3.87–5.11)
RDW: 13.2 % (ref 11.5–15.5)
WBC: 5.7 10*3/uL (ref 4.0–10.5)

## 2016-05-26 LAB — COMPREHENSIVE METABOLIC PANEL
ALBUMIN: 4.1 g/dL (ref 3.5–5.0)
ALK PHOS: 80 U/L (ref 38–126)
ALT: 16 U/L (ref 14–54)
ANION GAP: 8 (ref 5–15)
AST: 21 U/L (ref 15–41)
BUN: 7 mg/dL (ref 6–20)
CALCIUM: 9 mg/dL (ref 8.9–10.3)
CHLORIDE: 103 mmol/L (ref 101–111)
CO2: 25 mmol/L (ref 22–32)
CREATININE: 0.83 mg/dL (ref 0.44–1.00)
GFR calc non Af Amer: >60 mL/min (ref >60)
GLUCOSE: 85 mg/dL (ref 65–99)
Potassium: 3.5 mmol/L (ref 3.5–5.1)
SODIUM: 136 mmol/L (ref 135–145)
Total Bilirubin: 1.6 mg/dL — ABNORMAL HIGH (ref 0.3–1.2)
Total Protein: 7.6 g/dL (ref 6.5–8.1)

## 2016-05-26 LAB — PROTIME-INR
INR: 1.06
Prothrombin Time: 13.8 seconds (ref 11.4–15.2)

## 2016-05-26 LAB — HCG, SERUM, QUALITATIVE: PREG SERUM: NEGATIVE

## 2016-05-26 LAB — URIC ACID: URIC ACID, SERUM: 3.6 mg/dL (ref 2.3–6.6)

## 2016-05-26 LAB — LACTATE DEHYDROGENASE: LDH: 181 U/L (ref 98–192)

## 2016-05-26 MED ORDER — LIDOCAINE-EPINEPHRINE (PF) 2 %-1:200000 IJ SOLN
INTRAMUSCULAR | Status: DC | PRN
  Administered 2016-05-26: 20 mL

## 2016-05-26 MED ORDER — SODIUM CHLORIDE 0.9 % IV SOLN
INTRAVENOUS | Status: DC
  Administered 2016-05-26: 08:00:00 via INTRAVENOUS

## 2016-05-26 MED ORDER — MIDAZOLAM HCL 2 MG/2ML IJ SOLN
INTRAMUSCULAR | Status: AC
  Filled 2016-05-26: qty 4

## 2016-05-26 MED ORDER — HEPARIN SOD (PORK) LOCK FLUSH 100 UNIT/ML IV SOLN
INTRAVENOUS | Status: AC
  Administered 2016-05-26: 11:00:00
  Filled 2016-05-26: qty 5

## 2016-05-26 MED ORDER — MIDAZOLAM HCL 2 MG/2ML IJ SOLN
INTRAMUSCULAR | Status: AC | PRN
  Administered 2016-05-26: 0.5 mg via INTRAVENOUS
  Administered 2016-05-26: 1 mg via INTRAVENOUS
  Administered 2016-05-26: 0.5 mg via INTRAVENOUS
  Administered 2016-05-26: 1 mg via INTRAVENOUS

## 2016-05-26 MED ORDER — CEFAZOLIN SODIUM-DEXTROSE 2-4 GM/100ML-% IV SOLN
2.0000 g | INTRAVENOUS | Status: AC
  Administered 2016-05-26: 2 g via INTRAVENOUS
  Filled 2016-05-26: qty 100

## 2016-05-26 MED ORDER — FENTANYL CITRATE (PF) 100 MCG/2ML IJ SOLN
INTRAMUSCULAR | Status: AC | PRN
  Administered 2016-05-26 (×2): 50 ug via INTRAVENOUS

## 2016-05-26 MED ORDER — FENTANYL CITRATE (PF) 100 MCG/2ML IJ SOLN
INTRAMUSCULAR | Status: AC
  Filled 2016-05-26: qty 4

## 2016-05-26 MED ORDER — LIDOCAINE-EPINEPHRINE (PF) 2 %-1:200000 IJ SOLN
INTRAMUSCULAR | Status: AC
  Filled 2016-05-26: qty 20

## 2016-05-26 NOTE — Sedation Documentation (Signed)
Patient is resting comfortably. 

## 2016-05-26 NOTE — Procedures (Signed)
Interventional Radiology Procedure Note  Procedure: Placement of a right IJ approach single lumen PowerPort.  Tip is positioned at the upper RA and catheter is ready for immediate use.  Complications: No immediate Recommendations:  - Ok to shower tomorrow - Do not submerge for 7 days - Routine line care   Signed,  Criselda Peaches, MD

## 2016-05-26 NOTE — H&P (Signed)
Referring Physician(s): Heath Lark  Supervising Physician: Jacqulynn Cadet  Patient Status:  [name]  Chief Complaint:  "I'm here for a port a cath"  Subjective: Patient familiar to IR service from multiple past procedures, including cervical lymph node biopsy in February  2016, right chest wall Port-A-Cath placement in April 2016 (removed 07/2015), right ilium lesion biopsy in March 2017 and most recently a left para-aortic lymph node biopsy  earlier this month. She has a history of relapsing Hodgkin's lymphoma and presents again today for port-a-cath placement for additional chemotherapy.She currently denies fever, headache, chest pain, cough, back pain, nausea, vomiting or abnormal bleeding. She does have intermittent abdominal pain and occasional dyspnea with exertion. Past Medical History:  Diagnosis Date  . AIN III (anal intraepithelial neoplasia III)   . Anemia   . Cancer ([place])    Hodgkin lymphoma  . Chest wall pain 06/27/2015  . Condyloma acuminatum in female   . Depression   . History of chronic bronchitis   . History of esophagitis    CANDIDA  . History of shingles   . HIV (human immunodeficiency virus infection) ([place])   . Hodgkin's lymphoma ([place]) 06/12/2014  . HSV (herpes simplex virus) infection   . Hypokalemia 07/17/2014  . Periodontitis, chronic   . Screening examination for venereal disease 10/30/2013   Past Surgical History:  Procedure Laterality Date  . DIAGNOSTIC LAPAROSCOPY WITH REMOVAL OF ECTOPIC PREGNANCY N/A 11/17/2015   Procedure: LAPAROSCOPY LEFT  SALPINGECTOMY SECONDARY TO LEFT ECTOPIC PREGNANCY;  Surgeon: Jonnie Kind, MD;  Location: [facility];  Service: Gynecology;  Laterality: N/A;  . DILATION AND CURETTAGE OF UTERUS  2005   MISSED AB  . EXAMINATION UNDER ANESTHESIA N/A 09/23/2012   Procedure: EXAM UNDER ANESTHESIA;  Surgeon: Adin Hector, MD;  Location: [facility];  Service: General;  Laterality: N/A;  . LASER ABLATION CONDOLAMATA  N/A 09/23/2012   Procedure: REMOVAL/ABLATION  ABLATION CONDOLAMATA WARTS;  Surgeon: Adin Hector, MD;  Location: [facility];  Service: General;  Laterality: N/A;      Allergies: Patient has no known allergies.  Medications: Prior to Admission medications   Medication Sig Start Date End Date Taking? Authorizing Provider  albuterol (PROVENTIL HFA;VENTOLIN HFA) 108 (90 Base) MCG/ACT inhaler Inhale 2 puffs into the lungs every 4 (four) hours as needed for wheezing or shortness of breath. 04/18/16  Yes Tobie Poet, DO  atazanavir (REYATAZ) 300 MG capsule Take 1 capsule (300 mg total) by mouth daily with breakfast. 05/08/16  Yes Thayer Headings, MD  ibuprofen (ADVIL,MOTRIN) 200 MG tablet Take 400 mg by mouth every 6 (six) hours as needed for headache, mild pain or moderate pain.   Yes Historical Provider, MD  Naphazoline-Glycerin (CLEAR EYES MAX REDNESS RELIEF [place]) Place 1 drop into both eyes 2 (two) times daily as needed.   Yes Historical Provider, MD  NORVIR 100 MG TABS tablet TAKE 1 TABLET BY MOUTH EVERY MORNING WITH BREAKFAST. TAKE WITH REYATAZ 05/01/16  Yes Campbell Riches, MD  traMADol (ULTRAM) 50 MG tablet Take 1 tablet (50 mg total) by mouth every 4 (four) hours as needed. for pain 05/19/16  Yes Heath Lark, MD  TRUVADA 200-300 MG tablet TAKE 1 TABLET BY MOUTH EVERY DAY 05/01/16  Yes Campbell Riches, MD  HYDROmorphone (DILAUDID) 2 MG tablet Take 1 tablet (2 mg total) by mouth every 6 (six) hours as needed for severe pain. 05/19/16   Heath Lark, MD  Vital Signs: BP (!) 114/55 (BP Location: Right Arm)   Pulse (!) 54   Temp 98 F (36.7 C) (Oral)   Resp 18   Ht 5\' 4"  (1.626 m)   Wt 106 lb 1.6 oz (48.1 kg)   LMP 05/21/2016   SpO2 100%   BMI 18.21 kg/m   Physical Exam Patient awake, alert. Chest clear to auscultation bilaterally. Heart with slightly bradycardic but regular rhythm. Abdomen soft, positive bowel sounds, mildly tender pelvic region. Lower extremities -no  edema.  Imaging: No results found.  Labs:  CBC:  Recent Labs  02/20/16 0908 04/18/16 1342 05/11/16 0631 05/26/16 0729  WBC 4.8 6.1 8.3 5.7  HGB 13.9 13.6 13.5 14.1  HCT 39.9 39.3 38.4 39.4  PLT 327 296 237 263    COAGS:  Recent Labs  07/23/15 0715 08/08/15 0750 05/11/16 0631 05/26/16 0729  INR 1.15 1.05 1.10 1.06  APTT  --  31 31  --     BMP:  Recent Labs  11/17/15 1217 01/29/16 0901 02/20/16 0908 04/18/16 1342 05/26/16 0729  NA 135 141 137 138 136  K 3.7 3.4* 3.8 4.4 3.5  CL 104  --  103 103 103  CO2 26 27 28 28 25   GLUCOSE 86 96 74 86 85  BUN 5* 8.8 8 8 7   CALCIUM 9.1 9.7 9.2 9.6 9.0  CREATININE 0.64 1.0 0.93 0.93 0.83  GFRNONAA >60  --  83 >60 >60  GFRAA >60  --  >89 >60 >60    LIVER FUNCTION TESTS:  Recent Labs  01/29/16 0901 02/20/16 0908 04/18/16 1342 05/26/16 0729  BILITOT 2.32* 1.0 1.5* 1.6*  AST 16 21 26 21   ALT 14 19 23 16   ALKPHOS 92 73 74 80  PROT 7.8 7.2 7.7 7.6  ALBUMIN 3.9 4.1 3.9 4.1    Assessment and Plan: Patient with history of relapsing Hodgkin's lymphoma. She presents today for Port-A-Cath placement for chemotherapy.Risks and benefits discussed with the patient/family including, but not limited to bleeding, infection, pneumothorax, or fibrin sheath development and need for additional procedures. All of the patient's questions were answered, patient is agreeable to proceed. Consent signed and in chart.     Electronically Signed: D. Rowe Robert 05/26/2016, 8:45 AM   I spent a total of 20 minutes at the the patient's bedside AND on the patient's hospital floor or unit, greater than 50% of which was counseling/coordinating care for Port-A-Cath placement

## 2016-05-26 NOTE — Discharge Instructions (Addendum)
Implanted Port Insertion, Care After Refer to this sheet in the next few weeks. These instructions provide you with information on caring for yourself after your procedure. Your health care provider may also give you more specific instructions. Your treatment has been planned according to current medical practices, but problems sometimes occur. Call your health care provider if you have any problems or questions after your procedure. WHAT TO EXPECT AFTER THE PROCEDURE After your procedure, it is typical to have the following:   Discomfort at the port insertion site. Ice packs to the area will help.  Bruising on the skin over the port. This will subside in 3-4 days. HOME CARE INSTRUCTIONS  After your port is placed, you will get a manufacturer's information card. The card has information about your port. Keep this card with you at all times.   Know what kind of port you have. There are many types of ports available.   Wear a medical alert bracelet in case of an emergency. This can help alert health care workers that you have a port.   The port can stay in for as long as your health care provider believes it is necessary.   A home health care nurse may give medicines and take care of the port.   You or a family member can get special training and directions for giving medicine and taking care of the port at home.  SEEK MEDICAL CARE IF:   Your port does not flush or you are unable to get a blood return.   You have a fever or chills. SEEK IMMEDIATE MEDICAL CARE IF:  You have new fluid or pus coming from your incision.   You notice a bad smell coming from your incision site.   You have swelling, pain, or more redness at the incision or port site.   You have chest pain or shortness of breath. This information is not intended to replace advice given to you by your health care provider. Make sure you discuss any questions you have with your health care provider. Document  Released: 02/01/2013 Document Revised: 04/18/2013 Document Reviewed: 02/01/2013 Elsevier Interactive Patient Education  2017 [place] your port site daily. Keep site clean and dry.  May remove dressing and shower in 24 to 48 hours Do not submerge in tub or pool until incision is healed.  Moderate Conscious Sedation, Adult, Care After These instructions provide you with information about caring for yourself after your procedure. Your health care provider may also give you more specific instructions. Your treatment has been planned according to current medical practices, but problems sometimes occur. Call your health care provider if you have any problems or questions after your procedure. What can I expect after the procedure? After your procedure, it is common:  To feel sleepy for several hours.  To feel clumsy and have poor balance for several hours.  To have poor judgment for several hours.  To vomit if you eat too soon. Follow these instructions at home: For at least 24 hours after the procedure:   Do not:  Participate in activities where you could fall or become injured.  Drive.  Use heavy machinery.  Drink alcohol.  Take sleeping pills or medicines that cause drowsiness.  Make important decisions or sign legal documents.  Take care of children on your own.  Rest. Eating and drinking  Follow the diet recommended by your health care provider.  If you vomit:  Drink water, juice, or soup when you  can drink without vomiting.  Make sure you have little or no nausea before eating solid foods. General instructions  Have a responsible adult stay with you until you are awake and alert.  Take over-the-counter and prescription medicines only as told by your health care provider.  If you smoke, do not smoke without supervision.  Keep all follow-up visits as told by your health care provider. This is important. Contact a health care provider if:  You  keep feeling nauseous or you keep vomiting.  You feel light-headed.  You develop a rash.  You have a fever. Get help right away if:  You have trouble breathing. This information is not intended to replace advice given to you by your health care provider. Make sure you discuss any questions you have with your health care provider. Document Released: 02/01/2013 Document Revised: 09/16/2015 Document Reviewed: 08/03/2015 Elsevier Interactive Patient Education  2017 [employer].

## 2016-05-27 ENCOUNTER — Other Ambulatory Visit: Payer: Medicaid Other

## 2016-05-27 ENCOUNTER — Ambulatory Visit: Payer: Medicaid Other | Admitting: Hematology and Oncology

## 2016-05-27 ENCOUNTER — Ambulatory Visit: Payer: Medicaid Other

## 2016-05-27 LAB — VITAMIN D 25 HYDROXY (VIT D DEFICIENCY, FRACTURES): VIT D 25 HYDROXY: 21.2 ng/mL — AB (ref 30.0–100.0)

## 2016-05-28 ENCOUNTER — Other Ambulatory Visit: Payer: Medicaid Other

## 2016-05-31 ENCOUNTER — Other Ambulatory Visit: Payer: Self-pay | Admitting: Hematology and Oncology

## 2016-06-01 ENCOUNTER — Telehealth: Payer: Self-pay | Admitting: *Deleted

## 2016-06-01 NOTE — Telephone Encounter (Signed)
"  What time do I have to be there tomorrow?" Radiology arrival 6:30 am for PET.  [place] first appointment begins at 8:15 am.  No further questions.

## 2016-06-02 ENCOUNTER — Encounter: Payer: Self-pay | Admitting: Hematology and Oncology

## 2016-06-02 ENCOUNTER — Ambulatory Visit (HOSPITAL_COMMUNITY)
Admission: RE | Admit: 2016-06-02 | Discharge: 2016-06-02 | Disposition: A | Discharge status: Home / Self Care | Payer: Medicaid Other | Source: Ambulatory Visit | Attending: Hematology and Oncology | Admitting: Hematology and Oncology

## 2016-06-02 ENCOUNTER — Other Ambulatory Visit: Payer: Self-pay | Admitting: *Deleted

## 2016-06-02 ENCOUNTER — Encounter: Payer: Self-pay | Admitting: *Deleted

## 2016-06-02 ENCOUNTER — Other Ambulatory Visit: Payer: Medicaid Other

## 2016-06-02 ENCOUNTER — Ambulatory Visit: Payer: Medicaid Other | Admitting: Hematology and Oncology

## 2016-06-02 ENCOUNTER — Ambulatory Visit (HOSPITAL_BASED_OUTPATIENT_CLINIC_OR_DEPARTMENT_OTHER): Payer: Medicaid Other

## 2016-06-02 ENCOUNTER — Telehealth: Payer: Self-pay

## 2016-06-02 ENCOUNTER — Telehealth: Payer: Self-pay | Admitting: Hematology and Oncology

## 2016-06-02 ENCOUNTER — Telehealth: Payer: Self-pay | Admitting: *Deleted

## 2016-06-02 ENCOUNTER — Ambulatory Visit (HOSPITAL_BASED_OUTPATIENT_CLINIC_OR_DEPARTMENT_OTHER): Payer: Medicaid Other | Admitting: Hematology and Oncology

## 2016-06-02 ENCOUNTER — Other Ambulatory Visit (HOSPITAL_BASED_OUTPATIENT_CLINIC_OR_DEPARTMENT_OTHER): Payer: Medicaid Other

## 2016-06-02 VITALS — BP 114/64 | HR 65 | Temp 98.7°F | Resp 18

## 2016-06-02 DIAGNOSIS — C8118 Nodular sclerosis classical Hodgkin lymphoma, lymph nodes of multiple sites: Secondary | ICD-10-CM

## 2016-06-02 DIAGNOSIS — C7951 Secondary malignant neoplasm of bone: Secondary | ICD-10-CM

## 2016-06-02 DIAGNOSIS — R7989 Other specified abnormal findings of blood chemistry: Secondary | ICD-10-CM | POA: Diagnosis not present

## 2016-06-02 DIAGNOSIS — Z5111 Encounter for antineoplastic chemotherapy: Secondary | ICD-10-CM | POA: Diagnosis present

## 2016-06-02 DIAGNOSIS — R945 Abnormal results of liver function studies: Secondary | ICD-10-CM

## 2016-06-02 DIAGNOSIS — B20 Human immunodeficiency virus [HIV] disease: Secondary | ICD-10-CM

## 2016-06-02 DIAGNOSIS — Z9221 Personal history of antineoplastic chemotherapy: Secondary | ICD-10-CM | POA: Insufficient documentation

## 2016-06-02 DIAGNOSIS — M79605 Pain in left leg: Secondary | ICD-10-CM

## 2016-06-02 DIAGNOSIS — M79604 Pain in right leg: Secondary | ICD-10-CM

## 2016-06-02 LAB — CBC WITH DIFFERENTIAL/PLATELET
BASO%: 0.2 % (ref 0.0–2.0)
BASOS ABS: 0 10*3/uL (ref 0.0–0.1)
EOS%: 0.7 % (ref 0.0–7.0)
Eosinophils Absolute: 0 10*3/uL (ref 0.0–0.5)
HEMATOCRIT: 45.9 % (ref 34.8–46.6)
HEMOGLOBIN: 15.4 g/dL (ref 11.6–15.9)
LYMPH#: 2 10*3/uL (ref 0.9–3.3)
LYMPH%: 33.9 % (ref 14.0–49.7)
MCH: 34.5 pg — AB (ref 25.1–34.0)
MCHC: 33.6 g/dL (ref 31.5–36.0)
MCV: 102.9 fL — ABNORMAL HIGH (ref 79.5–101.0)
MONO#: 0.5 10*3/uL (ref 0.1–0.9)
MONO%: 8.1 % (ref 0.0–14.0)
NEUT%: 57.1 % (ref 38.4–76.8)
NEUTROS ABS: 3.4 10*3/uL (ref 1.5–6.5)
Platelets: 238 10*3/uL (ref 145–400)
RBC: 4.46 10*6/uL (ref 3.70–5.45)
RDW: 13.2 % (ref 11.2–14.5)
WBC: 5.9 10*3/uL (ref 3.9–10.3)

## 2016-06-02 LAB — COMPREHENSIVE METABOLIC PANEL
ALT: 13 U/L (ref 0–55)
ANION GAP: 10 meq/L (ref 3–11)
AST: 19 U/L (ref 5–34)
Albumin: 4.4 g/dL (ref 3.5–5.0)
Alkaline Phosphatase: 105 U/L (ref 40–150)
BILIRUBIN TOTAL: 2.29 mg/dL — AB (ref 0.20–1.20)
BUN: 10 mg/dL (ref 7.0–26.0)
CALCIUM: 9.8 mg/dL (ref 8.4–10.4)
CHLORIDE: 104 meq/L (ref 98–109)
CO2: 25 meq/L (ref 22–29)
Creatinine: 0.9 mg/dL (ref 0.6–1.1)
EGFR: >90 mL/min/{1.73_m2} (ref >90)
Glucose: 79 mg/dl (ref 70–140)
Potassium: 3.9 mEq/L (ref 3.5–5.1)
Sodium: 138 mEq/L (ref 136–145)
TOTAL PROTEIN: 8.7 g/dL — AB (ref 6.4–8.3)

## 2016-06-02 LAB — URIC ACID: URIC ACID, SERUM: 2.9 mg/dL (ref 2.6–7.4)

## 2016-06-02 LAB — GLUCOSE, CAPILLARY: GLUCOSE-CAPILLARY: 76 mg/dL (ref 65–99)

## 2016-06-02 LAB — LACTATE DEHYDROGENASE: LDH: 204 U/L (ref 125–245)

## 2016-06-02 MED ORDER — FLUDEOXYGLUCOSE F - 18 (FDG) INJECTION
8.3500 | Freq: Once | INTRAVENOUS | Status: AC | PRN
  Administered 2016-06-02: 8.35 via INTRAVENOUS

## 2016-06-02 MED ORDER — LEUPROLIDE ACETATE 7.5 MG IM KIT
7.5000 mg | PACK | Freq: Once | INTRAMUSCULAR | Status: AC
  Administered 2016-06-02: 7.5 mg via INTRAMUSCULAR
  Filled 2016-06-02: qty 7.5

## 2016-06-02 NOTE — Progress Notes (Signed)
[facility] OFFICE PROGRESS NOTE  Patient Care Team: Robert W Comer, MD as PCP - General (Internal Medicine) Robert W Comer, MD as PCP - Infectious Diseases (Infectious Diseases) James G Arnold, MD as Consulting Physician (Obstetrics and Gynecology) Michael B Wert, MD as Consulting Physician (Pulmonary Disease) Steven C Hendrickson, MD as Consulting Physician (Cardiothoracic Surgery)  SUMMARY OF ONCOLOGIC HISTORY:   Hodgkin lymphoma, nodular sclerosis (HCC)   05/06/2014 Imaging    CT scan of the abdomen show diffuse mesenteric lymphadenopathy.      05/07/2014 Imaging    CT scan of the chest show right thoracic inlet lymphadenopathy      06/07/2014 Procedure    She underwent ultrasound-guided core biopsy of the neck lymph node      06/07/2014 Pathology Results    Accession: [id number] biopsy confirmed diagnosis of Hodgkin lymphoma.      06/15/2014 Imaging    Echocardiogram showed preserved ejection fraction      07/09/2014 - 07/12/2014 Hospital Admission    She was admitted to the hospital for severe anemia.      07/27/2014 Procedure    She had placement of port      07/31/2014 - 09/11/2014 Chemotherapy    She received dose adjusted chemotherapy due to abnormal liver function tests and severe anemia. Treatment was delayed due to noncompliance  and subsequently stopped because the patient failed to keep appointments      01/11/2015 Imaging    Repeat PET CT scan showed response to treatment      01/28/2015 - 06/18/2015 Chemotherapy    ABVD was restarted with full dose.      02/08/2015 - 02/10/2015 Hospital Admission    The patient was admitted to the hospital due to pancytopenia and profuse diarrhea. Cultures were negative. She was placed on ciprofloxacin.      02/11/2015 Adverse Reaction    Treatment was placed on hold due to recent infection.      04/11/2015 Imaging    PET CT scan showed near complete response. Incidental finding of an abnormal bone lesion,  indeterminate. She is not symptomatic. Recommendation from [facility] to observe      07/11/2015 Imaging    PET CT scan showed abnormal new bone lesions, suggestive of possible disease progression      07/23/2015 Bone Marrow Biopsy    She underwent bone biopsy      07/23/2015 Pathology Results    Accession: [id number]  biopsy was negative for cancer      11/21/2015 Surgery    She had surgery for ectopic pregnancy      01/29/2016 Imaging    Ct chest, abdomen and pelvis showed pelvic and retroperitoneal lymphadenopathy, as above, concerning for residual disease. There is also a mildly enlarged posterior mediastinal lymph node measuring 11 mm adjacent to the distal descending thoracic aorta. This may represent an additional focus of disease, but is the only finding of concern in the thorax on today's examination. Sclerosis in the right ilium at site of previously noted metabolically active lesion, grossly unchanged. No other definite osseous lesions are identified on today's examination. Spleen is normal in size and appearance.      02/14/2016 PET scan    Interval disease worsening with new foci of hypermetabolic activity in multiple retroperitoneal and pelvic lymph nodes as well as AP window and left hilar lymph nodes. (Deauville 5). There is also overall worsening of the osseous disease.      05/11/2016 Pathology Results      Diagnosis Lymph node, needle/core biopsy, Left para-aortic retroperitoneal - CLASSICAL HODGKIN LYMPHOMA. - SEE ONCOLOGY TABLE. Microscopic Comment LYMPHOMA Histologic type: Classical Hodgkin lymphoma. Grade (if applicable): N/A Flow cytometry: Not done. Immunohistochemical stains: CD15, CD20, CD3, LCA, PAX-5, CD30 with appropriate controls. Touch preps/imprints: Not performed. Comments: The sections show small needle core biopsy fragments displaying a polymorphous cellular proliferation of small lymphocytes, plasma cells, eosinophils, and large atypical mononuclear and  multilobated lymphoid cells with features of Reed-Sternberg cells and variants. This is associated with patchy areas of fibrosis. Immunohistochemical stains were performed and show that the large atypical lymphoid cells are positive for CD30, CD15 and PAX-5 and negative for LCA, CD20, CD3. The small lymphoid cells in the background show a mixture of T and B cells with predominance of T cells. The overall morphologic and histologic features are consistent with classical Hodgkin lymphoma. Further subtyping is challenging in limited small biopsy fragments but the patchy fibrosis suggests nodular sclerosis type.       05/11/2016 Procedure    She underwent CT guided biopsy of retroperitoneal lymph node      05/26/2016 Procedure    Successful placement of a right IJ approach Power Port with ultrasound and fluoroscopic guidance. The catheter is ready for use.      06/02/2016 PET scan    Mixed response to chemotherapy with some lymph nodes decreased in metabolic activity and some lymph nodes increase metabolic activity. Lymph node stations including mediastinum, periaortic retroperitoneum, and obturator node stations. Activity is remains relatively intense Deauville 4 2. LEFT infrahilar nodule / lymph node with intense metabolic activity decreased from prior. ( Deauville 4 ). 3. 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 Deauville 5       INTERVAL HISTORY: Please see below for problem oriented charting. She returns today to review test results She denies new bone pain. Denies recent infection.The patient denies any recent signs or symptoms of bleeding such as spontaneous epistaxis, hematuria or hematochezia. The patient was early to her appointment today and demanded to be seen immediately She missed her urine pregnancy test, to be done tomorrow prior to admission  REVIEW OF SYSTEMS:   Constitutional: Denies fevers, chills or abnormal weight loss Eyes: Denies blurriness  of vision Ears, nose, mouth, throat, and face: Denies mucositis or sore throat Respiratory: Denies cough, dyspnea or wheezes Cardiovascular: Denies palpitation, chest discomfort or lower extremity swelling Gastrointestinal:  Denies nausea, heartburn or change in bowel habits Skin: Denies abnormal skin rashes Lymphatics: Denies new lymphadenopathy or easy bruising Neurological:Denies numbness, tingling or new weaknesses Behavioral/Psych: Mood is stable, no new changes  All other systems were reviewed with the patient and are negative.  I have reviewed the past medical history, past surgical history, social history and family history with the patient and they are unchanged from previous note.  ALLERGIES:  has No Known Allergies.  MEDICATIONS:  Current Outpatient Prescriptions  Medication Sig Dispense Refill  . albuterol (PROVENTIL HFA;VENTOLIN HFA) 108 (90 Base) MCG/ACT inhaler Inhale 2 puffs into the lungs every 4 (four) hours as needed for wheezing or shortness of breath. 1 Inhaler 0  . atazanavir (REYATAZ) 300 MG capsule Take 1 capsule (300 mg total) by mouth daily with breakfast. 30 capsule 5  . HYDROmorphone (DILAUDID) 2 MG tablet Take 1 tablet (2 mg total) by mouth every 6 (six) hours as needed for severe pain. 40 tablet 0  . ibuprofen (ADVIL,MOTRIN) 200 MG tablet Take 400 mg by mouth every 6 (six) hours as  needed for headache, mild pain or moderate pain.    . Naphazoline-Glycerin (CLEAR EYES MAX REDNESS RELIEF OP) Place 1 drop into both eyes 2 (two) times daily as needed.    . NORVIR 100 MG TABS tablet TAKE 1 TABLET BY MOUTH EVERY MORNING WITH BREAKFAST. TAKE WITH REYATAZ 30 tablet 5  . traMADol (ULTRAM) 50 MG tablet Take 1 tablet (50 mg total) by mouth every 4 (four) hours as needed. for pain 90 tablet 0  . TRUVADA 200-300 MG tablet TAKE 1 TABLET BY MOUTH EVERY DAY 30 tablet 5   No current facility-administered medications for this visit.     PHYSICAL EXAMINATION: ECOG  PERFORMANCE STATUS: 1 - Symptomatic but completely ambulatory  Vitals:   06/02/16 0956  BP: 114/64  Pulse: 65  Resp: 18  Temp: 98.7 F (37.1 C)   Filed Weights   06/02/16 0956  Weight: 104 lb 3.2 oz (47.3 kg)    GENERAL:alert, no distress and comfortable SKIN: skin color, texture, turgor are normal, no rashes or significant lesions EYES: normal, Conjunctiva are pink and non-injected, sclera clear OROPHARYNX:no exudate, no erythema and lips, buccal mucosa, and tongue normal  NECK: supple, thyroid normal size, non-tender, without nodularity LYMPH:  no palpable lymphadenopathy in the cervical, axillary or inguinal LUNGS: clear to auscultation and percussion with normal breathing effort HEART: regular rate & rhythm and no murmurs and no lower extremity edema ABDOMEN:abdomen soft, non-tender and normal bowel sounds Musculoskeletal:no cyanosis of digits and no clubbing  NEURO: alert & oriented x 3 with fluent speech, no focal motor/sensory deficits  LABORATORY DATA:  I have reviewed the data as listed    Component Value Date/Time   NA 138 06/02/2016 0830   K 3.9 06/02/2016 0830   CL 103 05/26/2016 0729   CO2 25 06/02/2016 0830   GLUCOSE 79 06/02/2016 0830   BUN 10.0 06/02/2016 0830   CREATININE 0.9 06/02/2016 0830   CALCIUM 9.8 06/02/2016 0830   PROT 8.7 (H) 06/02/2016 0830   ALBUMIN 4.4 06/02/2016 0830   AST 19 06/02/2016 0830   ALT 13 06/02/2016 0830   ALKPHOS 105 06/02/2016 0830   BILITOT 2.29 (H) 06/02/2016 0830   GFRNONAA >60 05/26/2016 0729   GFRNONAA 83 02/20/2016 0908   GFRAA >60 05/26/2016 0729   GFRAA >89 02/20/2016 0908    No results found for: SPEP, UPEP  Lab Results  Component Value Date   WBC 5.9 06/02/2016   NEUTROABS 3.4 06/02/2016   HGB 15.4 06/02/2016   HCT 45.9 06/02/2016   MCV 102.9 (H) 06/02/2016   PLT 238 06/02/2016      Chemistry      Component Value Date/Time   NA 138 06/02/2016 0830   K 3.9 06/02/2016 0830   CL 103 05/26/2016 0729    CO2 25 06/02/2016 0830   BUN 10.0 06/02/2016 0830   CREATININE 0.9 06/02/2016 0830      Component Value Date/Time   CALCIUM 9.8 06/02/2016 0830   ALKPHOS 105 06/02/2016 0830   AST 19 06/02/2016 0830   ALT 13 06/02/2016 0830   BILITOT 2.29 (H) 06/02/2016 0830       RADIOGRAPHIC STUDIES: I have personally reviewed the radiological images as listed and agreed with the findings in the report. Nm Pet Image Restag (ps) Skull Base To Thigh  Result Date: 06/02/2016 CLINICAL DATA:  Subsequent treatment strategy for nodular sclerosing lymphoma. EXAM: NUCLEAR MEDICINE PET SKULL BASE TO THIGH TECHNIQUE: 8.4 mCi F-18 FDG was injected intravenously. Full-ring PET  imaging was performed from the skull base to thigh after the radiotracer. CT data was obtained and used for attenuation correction and anatomic localization. FASTING BLOOD GLUCOSE:  Value: 76 mg/dl COMPARISON:  PET-CT scan 02/14/2016 FINDINGS: NECK No lymphadenopathy in the neck. CHEST Hypermetabolic mediastinal nodes are decreased in metabolic activity. Nodes remain intensely metabolic however. For example node along the LEFT mainstem bronchus with SUV max equal 5.3 decreased from 11.9. LEFT infrahilar nodule with intense metabolic activity (SUV max equal 9.0 decreased from 12.4). No hypermetabolic axillary nodes. ABDOMEN/PELVIS Periaortic retroperitoneal adenopathy is similar to comparison exam. Example lymph node LEFT of the aorta SUV max equal 7.6 compares to 9.5. Group of lymph nodes anterior to the IVC SUV max equal 11.7 compared with SUV max 8.5. LEFT common iliac lymph node with SUV max 11.1 compares with SUV max 8.1. Two tree in metabolic activity of obturator lymph nodes. For example LEFT upper lymph node with SUV max 6.4 decreased from 15.3. Nodes are difficult difficult define on noncontrast CT. Little intra-abdominal fat appear No abnormal metabolic activity liver or spleen. SKELETON There several new foci of intense metabolic activity  within skeleton. Lesions at T4 and T6 with SUV max equal 6.6 and 7.6 respectively. Lesion the RIGHT inferior pubic ramus with SUV max equal 6.2 is new from prior. IMPRESSION: 1. Mixed response to chemotherapy with some lymph nodes decreased in metabolic activity and some lymph nodes increase metabolic activity. Lymph node stations including mediastinum, periaortic retroperitoneum, and obturator node stations. Activity is remains relatively intense Deauville 4 2. LEFT infrahilar nodule / lymph node with intense metabolic activity decreased from prior. ( Deauville 4 ). 3. 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 Deauville 5 Electronically Signed   By: Suzy Bouchard M.D.   On: 06/02/2016 10:08   Ir US Guide Vasc Access Right  Result Date: 05/26/2016 INDICATION: 31 year old female with recurrent nodular sclerosis Hodgkin's lymphoma. She requires venous access for chemotherapy. EXAM: IMPLANTED PORT A CATH PLACEMENT WITH ULTRASOUND AND FLUOROSCOPIC GUIDANCE MEDICATIONS: Ancef 2 gm IV; The antibiotic was administered within an appropriate time interval prior to skin puncture. ANESTHESIA/SEDATION: Versed 3 mg IV; Fentanyl 100 mcg IV; Moderate Sedation Time:  29 minutes The patient was continuously monitored during the procedure by the interventional radiology nurse under my direct supervision. FLUOROSCOPY TIME:  0 minutes, 12 seconds (1 mGy) COMPLICATIONS: None immediate. PROCEDURE: The right neck and chest was prepped with chlorhexidine, and draped in the usual sterile fashion using maximum barrier technique (cap and mask, sterile gown, sterile gloves, large sterile sheet, hand hygiene and cutaneous antiseptic). Antibiotic prophylaxis was provided with 2g Ancef administered IV one hour prior to skin incision. Local anesthesia was attained by infiltration with 1% lidocaine with epinephrine. Ultrasound demonstrated patency of the right internal jugular vein, and this was documented with  an image. Under real-time ultrasound guidance, this vein was accessed with a 21 gauge micropuncture needle and image documentation was performed. A small dermatotomy was made at the access site with an 11 scalpel. A 0.018" wire was advanced into the SVC and the access needle exchanged for a 9F micropuncture vascular sheath. The 0.018" wire was then removed and a 0.035" wire advanced into the IVC. An appropriate location for the subcutaneous reservoir was selected below the clavicle and an incision was made through the skin and underlying soft tissues. The subcutaneous tissues were then dissected using a combination of blunt and sharp surgical technique and a pocket was formed. A single lumen  power injectable portacatheter was then tunneled through the subcutaneous tissues from the pocket to the dermatotomy and the port reservoir placed within the subcutaneous pocket. The venous access site was then serially dilated and a peel away vascular sheath placed over the wire. The wire was removed and the port catheter advanced into position under fluoroscopic guidance. The catheter tip is positioned in the upper right atrium. This was documented with a spot image. The portacatheter was then tested and found to flush and aspirate well. The port was flushed with saline followed by 100 units/mL heparinized saline. The pocket was then closed in two layers using first subdermal inverted interrupted absorbable sutures followed by a running subcuticular suture. The epidermis was then sealed with Dermabond. The dermatotomy at the venous access site was also closed with a single inverted subdermal suture and the epidermis sealed with Dermabond. IMPRESSION: Successful placement of a right IJ approach Power Port with ultrasound and fluoroscopic guidance. The catheter is ready for use. Electronically Signed   By: Jacqulynn Cadet M.D.   On: 05/26/2016 11:32   Ct Biopsy  Result Date: 05/11/2016 CLINICAL DATA:  History of treated  classical Hodgkin's lymphoma. CT and PET scan now show evidence of disease recurrence/worsening with hypermetabolic mediastinal lymphadenopathy in the chest and retroperitoneal lymphadenopathy in the abdomen and pelvis. Para-aortic retroperitoneal adenopathy is down targeted for further tissue sampling. EXAM: CT GUIDED CORE BIOPSY OF RETROPERITONEAL ABDOMINAL LYMPH NODE COMPARISON:  CT of the abdomen and pelvis on 01/29/2016 and PET scan on 02/14/2016. ANESTHESIA/SEDATION: 2.0 mg IV Versed; 100 mcg IV Fentanyl Total Moderate Sedation Time:  20 minutes. The patient's level of consciousness and physiologic status were continuously monitored during the procedure by Radiology nursing. PROCEDURE: The procedure risks, benefits, and alternatives were explained to the patient. Questions regarding the procedure were encouraged and answered. The patient understands and consents to the procedure. A time-out was performed prior to initiating the procedure. The left posterior translumbar region was prepped with chlorhexidine in a sterile fashion, and a sterile drape was applied covering the operative field. A sterile gown and sterile gloves were used for the procedure. Local anesthesia was provided with 1% Lidocaine. CT was performed through the abdomen in a prone position. Under CT guidance, a 28 gauge trocar needle was advanced into the left retroperitoneum at the level of a a para-aortic lymph node. Core biopsy was performed with an 18 gauge device. Three separate core biopsy samples were submitted in saline. COMPLICATIONS: None FINDINGS: The most accessible retroperitoneal lymph node based on most recent imaging is to the left of the aorta and inferior to the left renal vein. This lymph node was targeted with solid tissue obtained. There was no immediate CT evidence of bleeding complication. The procedure was well tolerated. IMPRESSION: CT-guided core biopsy performed of left para-aortic lymphadenopathy. Electronically  Signed   By: Aletta Edouard M.D.   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 She requires venous access for chemotherapy. EXAM: IMPLANTED PORT A CATH PLACEMENT WITH ULTRASOUND AND FLUOROSCOPIC GUIDANCE MEDICATIONS: Ancef 2 gm IV; The antibiotic was administered within an appropriate time interval prior to skin puncture. ANESTHESIA/SEDATION: Versed 3 mg IV; Fentanyl 100 mcg IV; Moderate Sedation Time:  29 minutes The patient was continuously monitored during the procedure by the interventional radiology nurse under my direct supervision. FLUOROSCOPY TIME:  0 minutes, 12 seconds (1 mGy) COMPLICATIONS: None immediate. PROCEDURE: The  right neck and chest was prepped with chlorhexidine, and draped in the usual sterile fashion using maximum barrier technique (cap and mask, sterile gown, sterile gloves, large sterile sheet, hand hygiene and cutaneous antiseptic). Antibiotic prophylaxis was provided with 2g Ancef administered IV one hour prior to skin incision. Local anesthesia was attained by infiltration with 1% lidocaine with epinephrine. Ultrasound demonstrated patency of the right internal jugular vein, and this was documented with an image. Under real-time ultrasound guidance, this vein was accessed with a 21 gauge micropuncture needle and image documentation was performed. A small dermatotomy was made at the access site with an 11 scalpel. A 0.018" wire was advanced into the SVC and the access needle exchanged for a 63F micropuncture vascular sheath. The 0.018" wire was then removed and a 0.035" wire advanced into the IVC. An appropriate location for the subcutaneous reservoir was selected below the clavicle and an incision was made through the skin and underlying soft tissues. The subcutaneous tissues were then dissected using a combination of blunt and sharp surgical technique and a  pocket was formed. A single lumen power injectable portacatheter was then tunneled through the subcutaneous tissues from the pocket to the dermatotomy and the port reservoir placed within the subcutaneous pocket. The venous access site was then serially dilated and a peel away vascular sheath placed over the wire. The wire was removed and the port catheter advanced into position under fluoroscopic guidance. The catheter tip is positioned in the upper right atrium. This was documented with a spot image. The portacatheter was then tested and found to flush and aspirate well. The port was flushed with saline followed by 100 units/mL heparinized saline. The pocket was then closed in two layers using first subdermal inverted interrupted absorbable sutures followed by a running subcuticular suture. The epidermis was then sealed with Dermabond. The dermatotomy at the venous access site was also closed with a single inverted subdermal suture and the epidermis sealed with Dermabond. IMPRESSION: Successful placement of a right IJ approach Power Port with ultrasound and fluoroscopic guidance. The catheter is ready for use. Electronically Signed   By: Jacqulynn Cadet M.D.   On: 05/26/2016 11:32    ASSESSMENT & PLAN:  Hodgkin lymphoma, nodular sclerosis ([place]) I reviewed imaging study with the patient. She has relapsed stage IV disease. I will admit her to the hospital tomorrow for ICE chemotherapy. After that, she will return next week on every 06/08/2016 for G-CSF support. She will come back on 06/15/16 for blood draw & possible transfusion support. She is given Lupron injection today to suppress her ovarian function We discussed the role of bone marrow transplant and she agree for referral to [facility]. I will see her back at the end of the month prior to cycle 2 of treatment. I plan to repeat PET CT scan of the 2 cycles of treatment.  Abnormal liver function tests She has recurrent  elevated liver enzymes with elevated total bilirubin Liver parenchyma on PET CT scan is not abnormal Other liver enzymes adequate I will proceed with full dose treatment without dose adjustment  Human immunodeficiency virus (HIV) disease ([place]) Her last HIV viral load was undetectable. She will continue anti-retroviral treatment   No orders of the defined types were placed in this encounter.  All questions were answered. The patient knows to call the clinic with any problems, questions or concerns. No barriers to learning was detected. I spent 25 minutes counseling the patient face to face. The  total time spent in the appointment was 40 minutes and more than 50% was on counseling and review of test results     Heath Lark, MD 06/02/2016 6:53 PM

## 2016-06-02 NOTE — Telephone Encounter (Signed)
Per 2/6 LOS and staff message I have scheduled appts and notified the scheduler 

## 2016-06-02 NOTE — Patient Instructions (Signed)
Leuprolide depot injection What is this medicine? LEUPROLIDE (loo PROE lide) is a man-made protein that acts like a natural hormone in the body. It decreases testosterone in men and decreases estrogen in women. In men, this medicine is used to treat advanced prostate cancer. In women, some forms of this medicine may be used to treat endometriosis, uterine fibroids, or other female hormone-related problems. This medicine may be used for other purposes; ask your health care provider or pharmacist if you have questions. COMMON BRAND NAME(S): Eligard, Lupron Depot, Lupron Depot-Ped, Viadur What should I tell my health care provider before I take this medicine? They need to know if you have any of these conditions: -diabetes -heart disease or previous heart attack -high blood pressure -high cholesterol -mental illness -osteoporosis -pain or difficulty passing urine -seizures -spinal cord metastasis -stroke -suicidal thoughts, plans, or attempt; a previous suicide attempt by you or a family member -tobacco smoker -unusual vaginal bleeding (women) -an unusual or allergic reaction to leuprolide, benzyl alcohol, other medicines, foods, dyes, or preservatives -pregnant or trying to get pregnant -breast-feeding How should I use this medicine? This medicine is for injection into a muscle or for injection under the skin. It is given by a health care professional in a hospital or clinic setting. The specific product will determine how it will be given to you. Make sure you understand which product you receive and how often you will receive it. Talk to your pediatrician regarding the use of this medicine in children. Special care may be needed. Overdosage: If you think you have taken too much of this medicine contact a poison control center or emergency room at once. NOTE: This medicine is only for you. Do not share this medicine with others. What if I miss a dose? It is important not to miss a dose.  Call your doctor or health care professional if you are unable to keep an appointment. Depot injections: Depot injections are given either once-monthly, every 12 weeks, every 16 weeks, or every 24 weeks depending on the product you are prescribed. The product you are prescribed will be based on if you are female or female, and your condition. Make sure you understand your product and dosing. What may interact with this medicine? Do not take this medicine with any of the following medications: -chasteberry This medicine may also interact with the following medications: -herbal or dietary supplements, like black cohosh or DHEA -female hormones, like estrogens or progestins and birth control pills, patches, rings, or injections -female hormones, like testosterone This list may not describe all possible interactions. Give your health care provider a list of all the medicines, herbs, non-prescription drugs, or dietary supplements you use. Also tell them if you smoke, drink alcohol, or use illegal drugs. Some items may interact with your medicine. What should I watch for while using this medicine? Visit your doctor or health care professional for regular checks on your progress. During the first weeks of treatment, your symptoms may get worse, but then will improve as you continue your treatment. You may get hot flashes, increased bone pain, increased difficulty passing urine, or an aggravation of nerve symptoms. Discuss these effects with your doctor or health care professional, some of them may improve with continued use of this medicine. Female patients may experience a menstrual cycle or spotting during the first months of therapy with this medicine. If this continues, contact your doctor or health care professional. What side effects may I notice from receiving this medicine? Side   effects that you should report to your doctor or health care professional as soon as possible: -allergic reactions like skin  rash, itching or hives, swelling of the face, lips, or tongue -breathing problems -chest pain -depression or memory disorders -pain in your legs or groin -pain at site where injected or implanted -severe headache -swelling of the feet and legs -visual changes -vomiting Side effects that usually do not require medical attention (report to your doctor or health care professional if they continue or are bothersome): -breast swelling or tenderness -decrease in sex drive or performance -diarrhea -hot flashes -loss of appetite -muscle, joint, or bone pains -nausea -redness or irritation at site where injected or implanted -skin problems or acne This list may not describe all possible side effects. Call your doctor for medical advice about side effects. You may report side effects to FDA at 1-800-FDA-1088. Where should I keep my medicine? This drug is given in a hospital or clinic and will not be stored at home. NOTE: This sheet is a summary. It may not cover all possible information. If you have questions about this medicine, talk to your doctor, pharmacist, or health care provider.  2017 [employer] (2015-09-26 09:45:17)  

## 2016-06-02 NOTE — Telephone Encounter (Signed)
Message sent to Infusion scheduler to be added per 06/02/16 los. Appointments scheduled per 06/02/16 los. Patient was given a copy of the AVS report and appointment schedule,per 06/02/16 los.

## 2016-06-02 NOTE — Assessment & Plan Note (Signed)
She has recurrent elevated liver enzymes with elevated total bilirubin Liver parenchyma on PET CT scan is not abnormal Other liver enzymes adequate I will proceed with full dose treatment without dose adjustment 

## 2016-06-02 NOTE — Telephone Encounter (Signed)
Called [name] and instructed her to be here tomorrow for lab appointment at 0800, then she should go to admitting.  Called referral to [facility]. Ask [place] to call back Friday if we have not heard anything by then.

## 2016-06-02 NOTE — Assessment & Plan Note (Signed)
I reviewed imaging study with the patient. She has relapsed stage IV disease. I will admit her to the hospital tomorrow for ICE chemotherapy. After that, she will return next week on every 06/08/2016 for G-CSF support. She will come back on 06/15/16 for blood draw & possible transfusion support. 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 I will see her back at the end of the month prior to cycle 2 of treatment. I plan to repeat PET CT scan of the 2 cycles of treatment.

## 2016-06-02 NOTE — Assessment & Plan Note (Signed)
Her last HIV viral load was undetectable. She will continue anti-retroviral treatment 

## 2016-06-03 ENCOUNTER — Other Ambulatory Visit: Payer: Medicaid Other

## 2016-06-03 ENCOUNTER — Ambulatory Visit: Payer: Medicaid Other

## 2016-06-03 ENCOUNTER — Inpatient Hospital Stay (HOSPITAL_COMMUNITY)
Admission: AD | Admit: 2016-06-03 | Discharge: 2016-06-05 | DRG: 842 | Disposition: A | Discharge status: Home / Self Care | Payer: Medicaid Other | Source: Ambulatory Visit | Attending: Hematology and Oncology | Admitting: Hematology and Oncology

## 2016-06-03 ENCOUNTER — Ambulatory Visit: Payer: Medicaid Other | Admitting: Hematology and Oncology

## 2016-06-03 ENCOUNTER — Encounter (HOSPITAL_COMMUNITY): Payer: Self-pay

## 2016-06-03 DIAGNOSIS — C8118 Nodular sclerosis classical Hodgkin lymphoma, lymph nodes of multiple sites: Secondary | ICD-10-CM

## 2016-06-03 DIAGNOSIS — G4700 Insomnia, unspecified: Secondary | ICD-10-CM | POA: Diagnosis present

## 2016-06-03 DIAGNOSIS — Z809 Family history of malignant neoplasm, unspecified: Secondary | ICD-10-CM

## 2016-06-03 DIAGNOSIS — C8110 Nodular sclerosis classical Hodgkin lymphoma, unspecified site: Principal | ICD-10-CM | POA: Diagnosis present

## 2016-06-03 DIAGNOSIS — Z79899 Other long term (current) drug therapy: Secondary | ICD-10-CM | POA: Diagnosis not present

## 2016-06-03 DIAGNOSIS — R945 Abnormal results of liver function studies: Secondary | ICD-10-CM | POA: Diagnosis present

## 2016-06-03 DIAGNOSIS — Z9889 Other specified postprocedural states: Secondary | ICD-10-CM | POA: Diagnosis not present

## 2016-06-03 DIAGNOSIS — Z5111 Encounter for antineoplastic chemotherapy: Secondary | ICD-10-CM

## 2016-06-03 DIAGNOSIS — R748 Abnormal levels of other serum enzymes: Secondary | ICD-10-CM | POA: Diagnosis present

## 2016-06-03 DIAGNOSIS — C8190 Hodgkin lymphoma, unspecified, unspecified site: Secondary | ICD-10-CM | POA: Diagnosis present

## 2016-06-03 DIAGNOSIS — B20 Human immunodeficiency virus [HIV] disease: Secondary | ICD-10-CM | POA: Diagnosis not present

## 2016-06-03 DIAGNOSIS — F17210 Nicotine dependence, cigarettes, uncomplicated: Secondary | ICD-10-CM | POA: Diagnosis present

## 2016-06-03 DIAGNOSIS — Z8619 Personal history of other infectious and parasitic diseases: Secondary | ICD-10-CM

## 2016-06-03 DIAGNOSIS — R7989 Other specified abnormal findings of blood chemistry: Secondary | ICD-10-CM | POA: Diagnosis present

## 2016-06-03 DIAGNOSIS — C8198 Hodgkin lymphoma, unspecified, lymph nodes of multiple sites: Secondary | ICD-10-CM

## 2016-06-03 DIAGNOSIS — E559 Vitamin D deficiency, unspecified: Secondary | ICD-10-CM

## 2016-06-03 DIAGNOSIS — G4701 Insomnia due to medical condition: Secondary | ICD-10-CM

## 2016-06-03 LAB — VITAMIN D 25 HYDROXY (VIT D DEFICIENCY, FRACTURES): VIT D 25 HYDROXY: 23.3 ng/mL — AB (ref 30.0–100.0)

## 2016-06-03 LAB — PREGNANCY, URINE: Preg Test, Ur: NEGATIVE

## 2016-06-03 MED ORDER — ENOXAPARIN SODIUM 40 MG/0.4ML ~~LOC~~ SOLN
40.0000 mg | SUBCUTANEOUS | Status: DC
  Administered 2016-06-03 – 2016-06-05 (×3): 40 mg via SUBCUTANEOUS
  Filled 2016-06-03 (×3): qty 0.4

## 2016-06-03 MED ORDER — SODIUM CHLORIDE 0.9 % IV SOLN
INTRAVENOUS | Status: DC
  Administered 2016-06-03 – 2016-06-05 (×3): via INTRAVENOUS

## 2016-06-03 MED ORDER — ALBUTEROL SULFATE (2.5 MG/3ML) 0.083% IN NEBU: 3.0000 mL | INHALATION_SOLUTION | RESPIRATORY_TRACT | Status: DC | PRN

## 2016-06-03 MED ORDER — SODIUM CHLORIDE 0.9 % IV SOLN: INTRAVENOUS | Status: DC

## 2016-06-03 MED ORDER — ONDANSETRON 4 MG PO TBDP: 4.0000 mg | ORAL_TABLET | Freq: Three times a day (TID) | ORAL | Status: DC | PRN

## 2016-06-03 MED ORDER — SENNOSIDES-DOCUSATE SODIUM 8.6-50 MG PO TABS: 1.0000 | ORAL_TABLET | Freq: Every evening | ORAL | Status: DC | PRN

## 2016-06-03 MED ORDER — ALLOPURINOL 300 MG PO TABS
300.0000 mg | ORAL_TABLET | Freq: Every day | ORAL | Status: DC
  Administered 2016-06-03 – 2016-06-05 (×3): 300 mg via ORAL
  Filled 2016-06-03 (×3): qty 1

## 2016-06-03 MED ORDER — EMTRICITABINE-TENOFOVIR DF 200-300 MG PO TABS
1.0000 | ORAL_TABLET | Freq: Every day | ORAL | Status: DC
  Administered 2016-06-04 – 2016-06-05 (×2): 1 via ORAL
  Filled 2016-06-03 (×2): qty 1

## 2016-06-03 MED ORDER — IBUPROFEN 200 MG PO TABS: 400.0000 mg | ORAL_TABLET | Freq: Four times a day (QID) | ORAL | Status: DC | PRN

## 2016-06-03 MED ORDER — SODIUM CHLORIDE 0.9% FLUSH: 10.0000 mL | INTRAVENOUS | Status: DC | PRN

## 2016-06-03 MED ORDER — HEPARIN SOD (PORK) LOCK FLUSH 100 UNIT/ML IV SOLN: 250.0000 [IU] | Freq: Once | INTRAVENOUS | Status: DC | PRN

## 2016-06-03 MED ORDER — SODIUM CHLORIDE 0.9 % IV SOLN
Freq: Once | INTRAVENOUS | Status: AC
  Administered 2016-06-03: 16 mg via INTRAVENOUS
  Filled 2016-06-03: qty 8

## 2016-06-03 MED ORDER — SODIUM CHLORIDE 0.9 % IV SOLN
100.0000 mg/m2 | Freq: Once | INTRAVENOUS | Status: AC
  Administered 2016-06-03: 150 mg via INTRAVENOUS
  Filled 2016-06-03: qty 7.5

## 2016-06-03 MED ORDER — ONDANSETRON HCL 4 MG/2ML IJ SOLN
4.0000 mg | Freq: Three times a day (TID) | INTRAMUSCULAR | Status: DC | PRN
  Administered 2016-06-04 – 2016-06-05 (×2): 4 mg via INTRAVENOUS
  Filled 2016-06-03 (×2): qty 2

## 2016-06-03 MED ORDER — LIDOCAINE-PRILOCAINE 2.5-2.5 % EX CREA
TOPICAL_CREAM | CUTANEOUS | Status: DC | PRN
  Filled 2016-06-03: qty 5

## 2016-06-03 MED ORDER — ZOLPIDEM TARTRATE 5 MG PO TABS
5.0000 mg | ORAL_TABLET | Freq: Every evening | ORAL | Status: DC | PRN
  Administered 2016-06-03: 5 mg via ORAL
  Filled 2016-06-03: qty 1

## 2016-06-03 MED ORDER — NON FORMULARY: 1.0000 | Freq: Every day | Status: DC

## 2016-06-03 MED ORDER — SODIUM CHLORIDE 0.9 % IV SOLN
8.0000 mg | Freq: Three times a day (TID) | INTRAVENOUS | Status: DC | PRN
  Filled 2016-06-03: qty 4

## 2016-06-03 MED ORDER — HEPARIN SOD (PORK) LOCK FLUSH 100 UNIT/ML IV SOLN: 500.0000 [IU] | Freq: Once | INTRAVENOUS | Status: DC | PRN

## 2016-06-03 MED ORDER — HOT PACK MISC ONCOLOGY: 1.0000 | Freq: Once | Status: AC | PRN

## 2016-06-03 MED ORDER — ONDANSETRON HCL 4 MG PO TABS
4.0000 mg | ORAL_TABLET | Freq: Three times a day (TID) | ORAL | Status: DC | PRN
Start: 2016-06-03 — End: 2016-06-05

## 2016-06-03 MED ORDER — VITAMIN D3 25 MCG (1000 UNIT) PO TABS
1000.0000 [IU] | ORAL_TABLET | Freq: Every day | ORAL | Status: DC
  Administered 2016-06-03 – 2016-06-05 (×3): 1000 [IU] via ORAL
  Filled 2016-06-03 (×3): qty 1

## 2016-06-03 MED ORDER — RITONAVIR 100 MG PO TABS
100.0000 mg | ORAL_TABLET | Freq: Every day | ORAL | Status: DC
  Administered 2016-06-04 – 2016-06-05 (×2): 100 mg via ORAL
  Filled 2016-06-03 (×2): qty 1

## 2016-06-03 MED ORDER — ALTEPLASE 2 MG IJ SOLR: 2.0000 mg | Freq: Once | INTRAMUSCULAR | Status: DC | PRN

## 2016-06-03 MED ORDER — ACETAMINOPHEN 325 MG PO TABS: 650.0000 mg | ORAL_TABLET | ORAL | Status: DC | PRN

## 2016-06-03 MED ORDER — ATAZANAVIR SULFATE 150 MG PO CAPS
300.0000 mg | ORAL_CAPSULE | Freq: Every day | ORAL | Status: DC
  Administered 2016-06-04 – 2016-06-05 (×2): 300 mg via ORAL
  Filled 2016-06-03 (×2): qty 2

## 2016-06-03 MED ORDER — SODIUM CHLORIDE 0.9% FLUSH: 3.0000 mL | INTRAVENOUS | Status: DC | PRN

## 2016-06-03 MED ORDER — TRAMADOL HCL 50 MG PO TABS
50.0000 mg | ORAL_TABLET | ORAL | Status: DC | PRN
  Administered 2016-06-03: 50 mg via ORAL
  Filled 2016-06-03: qty 1

## 2016-06-03 NOTE — H&P (Signed)
[place] ADMISSION NOTE  Patient Care Team: Thayer Headings, MD as PCP - General (Internal Medicine) Thayer Headings, MD as PCP - Infectious Diseases (Infectious Diseases) Woodroe Mode, MD as Consulting Physician (Obstetrics and Gynecology) Tanda Rockers, MD as Consulting Physician (Pulmonary Disease) Melrose Nakayama, MD as Consulting Physician (Cardiothoracic Surgery)  CHIEF COMPLAINTS/PURPOSE OF ADMISSION Recurrent Hodgkin lymphoma  HISTORY OF PRESENTING ILLNESS:  [name] 31 y.o. 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      05/07/2014 Imaging    CT scan of the chest show right thoracic inlet lymphadenopathy      06/07/2014 Procedure    She underwent ultrasound-guided core biopsy of the neck lymph node      06/07/2014 Pathology Results    Accession: [id number] biopsy confirmed diagnosis of Hodgkin lymphoma.      06/15/2014 Imaging    Echocardiogram showed preserved ejection fraction      07/09/2014 - 07/12/2014 Hospital Admission    She was admitted to the hospital for severe anemia.      07/27/2014 Procedure    She had placement of port      07/31/2014 - 09/11/2014 Chemotherapy    She received dose adjusted chemotherapy due to abnormal liver function tests and severe anemia. Treatment was delayed due to noncompliance  and subsequently stopped because the patient failed to keep appointments      01/11/2015 Imaging    Repeat PET CT scan showed response to treatment      01/28/2015 - 06/18/2015 Chemotherapy    ABVD was restarted with full dose.      02/08/2015 - 02/10/2015 Hospital Admission    The patient was admitted to the hospital due to pancytopenia and profuse diarrhea. Cultures were negative. She was placed on ciprofloxacin.      02/11/2015 Adverse  Reaction    Treatment was placed on hold due to recent infection.      04/11/2015 Imaging    PET CT scan showed near complete response. Incidental finding of an abnormal bone lesion, indeterminate. She is not symptomatic. Recommendation from [facility] to observe      07/11/2015 Imaging    PET CT scan showed abnormal new bone lesions, suggestive of possible disease progression      07/23/2015 Bone Marrow Biopsy    She underwent bone biopsy      07/23/2015 Pathology Results    Accession: [id number]  biopsy was negative for cancer      11/21/2015 Surgery    She had surgery for ectopic pregnancy      01/29/2016 Imaging    Ct chest, abdomen and pelvis showed pelvic and retroperitoneal lymphadenopathy, as above, concerning for residual disease. There is also a mildly enlarged posterior mediastinal lymph node measuring 11 mm adjacent to the distal descending thoracic aorta. This may represent an additional focus of disease, but is the only finding of concern in the thorax on today's examination. Sclerosis in the right ilium at site of previously noted metabolically active lesion, grossly unchanged. No other definite osseous lesions are identified on today's examination. Spleen is normal in size and appearance.      02/14/2016 PET scan    Interval disease worsening with new foci of hypermetabolic activity in multiple retroperitoneal and pelvic lymph nodes as well as AP window  and left hilar lymph nodes. (Deauville 5). There is also overall worsening of the osseous disease.      05/11/2016 Pathology Results    Diagnosis Lymph node, needle/core biopsy, Left para-aortic retroperitoneal - CLASSICAL HODGKIN LYMPHOMA. - SEE ONCOLOGY TABLE. Microscopic Comment LYMPHOMA Histologic type: Classical Hodgkin lymphoma. Grade (if applicable): N/A Flow cytometry: Not done. Immunohistochemical stains: CD15, CD20, CD3, LCA, PAX-5, CD30 with appropriate controls. Touch preps/imprints: Not  performed. Comments: The sections show small needle core biopsy fragments displaying a polymorphous cellular proliferation of small lymphocytes, plasma cells, eosinophils, and large atypical mononuclear and multilobated lymphoid cells with features of Reed-Sternberg cells and variants. This is associated with patchy areas of fibrosis. Immunohistochemical stains were performed and show that the large atypical lymphoid cells are positive for CD30, CD15 and PAX-5 and negative for LCA, CD20, CD3. The small lymphoid cells in the background show a mixture of T and B cells with predominance of T cells. The overall morphologic and histologic features are consistent with classical Hodgkin lymphoma. Further subtyping is challenging in limited small biopsy fragments but the patchy fibrosis suggests nodular sclerosis type.       05/11/2016 Procedure    She underwent CT guided biopsy of retroperitoneal lymph node      05/26/2016 Procedure    Successful placement of a right IJ approach Power Port with ultrasound and fluoroscopic guidance. The catheter is ready for use.      06/02/2016 PET scan    Mixed response to chemotherapy with some lymph nodes decreased in metabolic activity and some lymph nodes increase metabolic activity. Lymph node stations including mediastinum, periaortic retroperitoneum, and obturator node stations. Activity is remains relatively intense Deauville 4 2. LEFT infrahilar nodule / lymph node with intense metabolic activity decreased from prior. ( Deauville 4 ). 3. 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 Deauville 5      She was seen in the outpatient clinic yesterday to discuss chemotherapy. She is ready for admission. She missed her urine pregnancy test yesterday. She had received Lupron injection She has no new concerns  MEDICAL HISTORY:  Past Medical History:  Diagnosis Date  . AIN III (anal intraepithelial neoplasia III)   . Anemia   . Cancer  ([place])    Hodgkin lymphoma  . Chest wall pain 06/27/2015  . Condyloma acuminatum in female   . Depression   . History of chronic bronchitis   . History of esophagitis    CANDIDA  . History of shingles   . HIV (human immunodeficiency virus infection) ([place])   . Hodgkin's lymphoma ([place]) 06/12/2014  . HSV (herpes simplex virus) infection   . Hypokalemia 07/17/2014  . Periodontitis, chronic   . Screening examination for venereal disease 10/30/2013    SURGICAL HISTORY: Past Surgical History:  Procedure Laterality Date  . DIAGNOSTIC LAPAROSCOPY WITH REMOVAL OF ECTOPIC PREGNANCY N/A 11/17/2015   Procedure: LAPAROSCOPY LEFT  SALPINGECTOMY SECONDARY TO LEFT ECTOPIC PREGNANCY;  Surgeon: Jonnie Kind, MD;  Location: [facility];  Service: Gynecology;  Laterality: N/A;  . DILATION AND CURETTAGE OF UTERUS  2005   MISSED AB  . EXAMINATION UNDER ANESTHESIA N/A 09/23/2012   Procedure: EXAM UNDER ANESTHESIA;  Surgeon: Adin Hector, MD;  Location: [facility];  Service: General;  Laterality: N/A;  . IR GENERIC HISTORICAL  05/26/2016   IR FLUORO GUIDE PORT INSERTION RIGHT 05/26/2016 [facility]  . IR GENERIC HISTORICAL  05/26/2016   IR US GUIDE VASC ACCESS RIGHT 05/26/2016 [facility]  . LASER ABLATION CONDOLAMATA N/A 09/23/2012   Procedure: REMOVAL/ABLATION  ABLATION CONDOLAMATA WARTS;  Surgeon: Adin Hector, MD;  Location: [facility];  Service: General;  Laterality: N/A;    SOCIAL HISTORY: Social History   Social History  . Marital status: Single    Spouse name: N/A  . Number of children: N/A  . Years of education: N/A   Occupational History  . Not on file.   Social History Main Topics  . Smoking status: Current Every Day Smoker    Packs/day: 0.50    Years: 7.00    Types: Cigars, Cigarettes    Start date: 03/19/2014  . Smokeless tobacco: Never Used     Comment: she smokes 3 Black and Mild Cigars daily  . Alcohol use No     Comment: Occasionally  . Drug  use: Yes    Types: Marijuana     Comment: 2 blunts per day  . Sexual activity: Yes    Partners: Male    Birth control/ protection: None     Comment: pt. declined condoms   Other Topics Concern  . Not on file   Social History Narrative  . No narrative on file    FAMILY HISTORY: Family History  Problem Relation Age of Onset  . Cancer Maternal Aunt     unknown ca  . Cancer Maternal Grandmother     unknown ca    ALLERGIES:  has No Known Allergies.  MEDICATIONS:  Current Facility-Administered Medications  Medication Dose Route Frequency Provider Last Rate Last Dose  . 0.9 %  sodium chloride infusion   Intravenous Continuous Heath Lark, MD      . acetaminophen (TYLENOL) tablet 650 mg  650 mg Oral Q4H PRN Heath Lark, MD      . cholecalciferol (VITAMIN D) tablet 1,000 Units  1,000 Units Oral Daily Darian Ace, MD      . enoxaparin (LOVENOX) injection 40 mg  40 mg Subcutaneous Q24H Lorie Cleckley, MD      . lidocaine-prilocaine (EMLA) cream   Topical PRN Heath Lark, MD      . ondansetron (ZOFRAN) tablet 4-8 mg  4-8 mg Oral Q8H PRN Heath Lark, MD       Or  . ondansetron (ZOFRAN-ODT) disintegrating tablet 4-8 mg  4-8 mg Oral Q8H PRN Heath Lark, MD       Or  . ondansetron (ZOFRAN) injection 4 mg  4 mg Intravenous Q8H PRN Heath Lark, MD       Or  . ondansetron (ZOFRAN) 8 mg in sodium chloride 0.9 % 50 mL IVPB  8 mg Intravenous Q8H PRN Hulan Szumski, MD      . senna-docusate (Senokot-S) tablet 1 tablet  1 tablet Oral QHS PRN Heath Lark, MD        REVIEW OF SYSTEMS:   Constitutional: Denies fevers, chills or abnormal night sweats Eyes: Denies blurriness of vision, double vision or watery eyes Ears, nose, mouth, throat, and face: Denies mucositis or sore throat Respiratory: Denies cough, dyspnea or wheezes Cardiovascular: Denies palpitation, chest discomfort or lower extremity swelling Gastrointestinal:  Denies nausea, heartburn or change in bowel habits Skin: Denies abnormal skin  rashes Lymphatics: Denies new lymphadenopathy or easy bruising Neurological:Denies numbness, tingling or new weaknesses Behavioral/Psych: Mood is stable, no new changes  All other systems were reviewed with the patient and are negative.  PHYSICAL EXAMINATION: ECOG PERFORMANCE STATUS: 0 - Asymptomatic  Vitals:   06/03/16 0813  BP: 107/68  Pulse:  88  Resp: 16  Temp: 98.3 F (36.8 C)   Filed Weights   06/03/16 0813  Weight: 102 lb 3.2 oz (46.4 kg)    GENERAL:alert, no distress and comfortable SKIN: skin color, texture, turgor are normal, no rashes or significant lesions EYES: normal, conjunctiva are pink and non-injected, sclera clear OROPHARYNX:no exudate, no erythema and lips, buccal mucosa, and tongue normal  NECK: supple, thyroid normal size, non-tender, without nodularity LYMPH:  no palpable lymphadenopathy in the cervical, axillary or inguinal LUNGS: clear to auscultation and percussion with normal breathing effort HEART: regular rate & rhythm and no murmurs and no lower extremity edema ABDOMEN:abdomen soft, non-tender and normal bowel sounds Musculoskeletal:no cyanosis of digits and no clubbing  PSYCH: alert & oriented x 3 with fluent speech NEURO: no focal motor/sensory deficits  LABORATORY DATA:  I have reviewed the data as listed Lab Results  Component Value Date   WBC 5.9 06/02/2016   HGB 15.4 06/02/2016   HCT 45.9 06/02/2016   MCV 102.9 (H) 06/02/2016   PLT 238 06/02/2016    Recent Labs  02/20/16 0908 04/18/16 1342 05/26/16 0729 06/02/16 0830  NA 137 138 136 138  K 3.8 4.4 3.5 3.9  CL 103 103 103  --   CO2 '28 28 25 25  ' GLUCOSE 74 86 85 79  BUN '8 8 7 ' 10.0  CREATININE 0.93 0.93 0.83 0.9  CALCIUM 9.2 9.6 9.0 9.8  GFRNONAA 83 >60 >60  --   GFRAA >89 >60 >60  --   PROT 7.2 7.7 7.6 8.7*  ALBUMIN 4.1 3.9 4.1 4.4  AST '21 26 21 19  ' ALT '19 23 16 13  ' ALKPHOS 73 74 80 105  BILITOT 1.0 1.5* 1.6* 2.29*    RADIOGRAPHIC STUDIES: I have personally  reviewed the radiological images as listed and agreed with the findings in the report. Nm Pet Image Restag (ps) Skull Base To Thigh  Result Date: 06/02/2016 CLINICAL DATA:  Subsequent treatment strategy for nodular sclerosing lymphoma. EXAM: NUCLEAR MEDICINE PET SKULL BASE TO THIGH TECHNIQUE: 8.4 mCi F-18 FDG was injected intravenously. Full-ring PET imaging was performed from the skull base to thigh after the radiotracer. CT data was obtained and used for attenuation correction and anatomic localization. FASTING BLOOD GLUCOSE:  Value: 76 mg/dl COMPARISON:  PET-CT scan 02/14/2016 FINDINGS: NECK No lymphadenopathy in the neck. CHEST Hypermetabolic mediastinal nodes are decreased in metabolic activity. Nodes remain intensely metabolic however. For example node along the LEFT mainstem bronchus with SUV max equal 5.3 decreased from 11.9. LEFT infrahilar nodule with intense metabolic activity (SUV max equal 9.0 decreased from 12.4). No hypermetabolic axillary nodes. ABDOMEN/PELVIS Periaortic retroperitoneal adenopathy is similar to comparison exam. Example lymph node LEFT of the aorta SUV max equal 7.6 compares to 9.5. Group of lymph nodes anterior to the IVC SUV max equal 11.7 compared with SUV max 8.5. LEFT common iliac lymph node with SUV max 11.1 compares with SUV max 8.1. Two tree in metabolic activity of obturator lymph nodes. For example LEFT upper lymph node with SUV max 6.4 decreased from 15.3. Nodes are difficult difficult define on noncontrast CT. Little intra-abdominal fat appear No abnormal metabolic activity liver or spleen. SKELETON There several new foci of intense metabolic activity within skeleton. Lesions at T4 and T6 with SUV max equal 6.6 and 7.6 respectively. Lesion the RIGHT inferior pubic ramus with SUV max equal 6.2 is new from prior. IMPRESSION: 1. Mixed response to chemotherapy with some lymph nodes decreased in metabolic activity  and some lymph nodes increase metabolic activity. Lymph node  stations including mediastinum, periaortic retroperitoneum, and obturator node stations. Activity is remains relatively intense Deauville 4 2. LEFT infrahilar nodule / lymph node with intense metabolic activity decreased from prior. ( Deauville 4 ). 3. 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 Deauville 5 Electronically Signed   By: Suzy Bouchard M.D.   On: 06/02/2016 10:08   Ir US Guide Vasc Access Right  Result Date: 05/26/2016 INDICATION: 31 year old female with recurrent nodular sclerosis Hodgkin's lymphoma. She requires venous access for chemotherapy. EXAM: IMPLANTED PORT A CATH PLACEMENT WITH ULTRASOUND AND FLUOROSCOPIC GUIDANCE MEDICATIONS: Ancef 2 gm IV; The antibiotic was administered within an appropriate time interval prior to skin puncture. ANESTHESIA/SEDATION: Versed 3 mg IV; Fentanyl 100 mcg IV; Moderate Sedation Time:  29 minutes The patient was continuously monitored during the procedure by the interventional radiology nurse under my direct supervision. FLUOROSCOPY TIME:  0 minutes, 12 seconds (1 mGy) COMPLICATIONS: None immediate. PROCEDURE: The right neck and chest was prepped with chlorhexidine, and draped in the usual sterile fashion using maximum barrier technique (cap and mask, sterile gown, sterile gloves, large sterile sheet, hand hygiene and cutaneous antiseptic). Antibiotic prophylaxis was provided with 2g Ancef administered IV one hour prior to skin incision. Local anesthesia was attained by infiltration with 1% lidocaine with epinephrine. Ultrasound demonstrated patency of the right internal jugular vein, and this was documented with an image. Under real-time ultrasound guidance, this vein was accessed with a 21 gauge micropuncture needle and image documentation was performed. A small dermatotomy was made at the access site with an 11 scalpel. A 0.018" wire was advanced into the SVC and the access needle exchanged for a 81F micropuncture vascular  sheath. The 0.018" wire was then removed and a 0.035" wire advanced into the IVC. An appropriate location for the subcutaneous reservoir was selected below the clavicle and an incision was made through the skin and underlying soft tissues. The subcutaneous tissues were then dissected using a combination of blunt and sharp surgical technique and a pocket was formed. A single lumen power injectable portacatheter was then tunneled through the subcutaneous tissues from the pocket to the dermatotomy and the port reservoir placed within the subcutaneous pocket. The venous access site was then serially dilated and a peel away vascular sheath placed over the wire. The wire was removed and the port catheter advanced into position under fluoroscopic guidance. The catheter tip is positioned in the upper right atrium. This was documented with a spot image. The portacatheter was then tested and found to flush and aspirate well. The port was flushed with saline followed by 100 units/mL heparinized saline. The pocket was then closed in two layers using first subdermal inverted interrupted absorbable sutures followed by a running subcuticular suture. The epidermis was then sealed with Dermabond. The dermatotomy at the venous access site was also closed with a single inverted subdermal suture and the epidermis sealed with Dermabond. IMPRESSION: Successful placement of a right IJ approach Power Port with ultrasound and fluoroscopic guidance. The catheter is ready for use. Electronically Signed   By: Jacqulynn Cadet M.D.   On: 05/26/2016 11:32   Ct Biopsy  Result Date: 05/11/2016 CLINICAL DATA:  History of treated classical Hodgkin's lymphoma. CT and PET scan now show evidence of disease recurrence/worsening with hypermetabolic mediastinal lymphadenopathy in the chest and retroperitoneal lymphadenopathy in the abdomen and pelvis. Para-aortic retroperitoneal adenopathy is down targeted for further tissue sampling. EXAM: CT GUIDED  CORE BIOPSY OF RETROPERITONEAL ABDOMINAL LYMPH NODE COMPARISON:  CT of the abdomen and pelvis on 01/29/2016 and PET scan on 02/14/2016. ANESTHESIA/SEDATION: 2.0 mg IV Versed; 100 mcg IV Fentanyl Total Moderate Sedation Time:  20 minutes. The patient's level of consciousness and physiologic status were continuously monitored during the procedure by Radiology nursing. PROCEDURE: The procedure risks, benefits, and alternatives were explained to the patient. Questions regarding the procedure were encouraged and answered. The patient understands and consents to the procedure. A time-out was performed prior to initiating the procedure. The left posterior translumbar region was prepped with chlorhexidine in a sterile fashion, and a sterile drape was applied covering the operative field. A sterile gown and sterile gloves were used for the procedure. Local anesthesia was provided with 1% Lidocaine. CT was performed through the abdomen in a prone position. Under CT guidance, a 69 gauge trocar needle was advanced into the left retroperitoneum at the level of a a para-aortic lymph node. Core biopsy was performed with an 18 gauge device. Three separate core biopsy samples were submitted in saline. COMPLICATIONS: None FINDINGS: The most accessible retroperitoneal lymph node based on most recent imaging is to the left of the aorta and inferior to the left renal vein. This lymph node was targeted with solid tissue obtained. There was no immediate CT evidence of bleeding complication. The procedure was well tolerated. IMPRESSION: CT-guided core biopsy performed of left para-aortic lymphadenopathy. Electronically Signed   By: Aletta Edouard M.D.   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 She requires venous access for chemotherapy. EXAM: IMPLANTED PORT A CATH PLACEMENT WITH ULTRASOUND AND FLUOROSCOPIC  GUIDANCE MEDICATIONS: Ancef 2 gm IV; The antibiotic was administered within an appropriate time interval prior to skin puncture. ANESTHESIA/SEDATION: Versed 3 mg IV; Fentanyl 100 mcg IV; Moderate Sedation Time:  29 minutes The patient was continuously monitored during the procedure by the interventional radiology nurse under my direct supervision. FLUOROSCOPY TIME:  0 minutes, 12 seconds (1 mGy) COMPLICATIONS: None immediate. PROCEDURE: The right neck and chest was prepped with chlorhexidine, and draped in the usual sterile fashion using maximum barrier technique (cap and mask, sterile gown, sterile gloves, large sterile sheet, hand hygiene and cutaneous antiseptic). Antibiotic prophylaxis was provided with 2g Ancef administered IV one hour prior to skin incision. Local anesthesia was attained by infiltration with 1% lidocaine with epinephrine. Ultrasound demonstrated patency of the right internal jugular vein, and this was documented with an image. Under real-time ultrasound guidance, this vein was accessed with a 21 gauge micropuncture needle and image documentation was performed. A small dermatotomy was made at the access site with an 11 scalpel. A 0.018" wire was advanced into the SVC and the access needle exchanged for a 1F micropuncture vascular sheath. The 0.018" wire was then removed and a 0.035" wire advanced into the IVC. An appropriate location for the subcutaneous reservoir was selected below the clavicle and an incision was made through the skin and underlying soft tissues. The subcutaneous tissues were then dissected using a combination of blunt and sharp surgical technique and a pocket was formed. A single lumen power injectable portacatheter was then tunneled through the subcutaneous tissues from the pocket to the dermatotomy and the port reservoir placed within the subcutaneous pocket. The venous access site was then serially dilated and a peel away vascular sheath placed over the wire. The wire was  removed and the port  catheter advanced into position under fluoroscopic guidance. The catheter tip is positioned in the upper right atrium. This was documented with a spot image. The portacatheter was then tested and found to flush and aspirate well. The port was flushed with saline followed by 100 units/mL heparinized saline. The pocket was then closed in two layers using first subdermal inverted interrupted absorbable sutures followed by a running subcuticular suture. The epidermis was then sealed with Dermabond. The dermatotomy at the venous access site was also closed with a single inverted subdermal suture and the epidermis sealed with Dermabond. IMPRESSION: Successful placement of a right IJ approach Power Port with ultrasound and fluoroscopic guidance. The catheter is ready for use. Electronically Signed   By: Jacqulynn Cadet M.D.   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 She has relapsed stage IV disease. She will start cycle 1 of ICE chemotherapy today I will order urine pregnancy test stat to complete her workup prior to starting treatment After that, she will return next week on every 06/08/2016 for G-CSF support. She will come back on 06/15/16 for blood draw & possible transfusion support. She has received Lupron injection 06/02/16 to suppress her ovarian function We discussed the role of bone marrow transplant and she agreed for referral to [facility]. I plan to repeat PET CT scan of the 2 cycles of treatment.  Abnormal liver function tests She has recurrent elevated liver enzymes with elevated total bilirubin Liver parenchyma on PET CT scan is not abnormal Other liver enzymes adequate I will proceed with full dose treatment without dose adjustment  Human immunodeficiency virus (HIV) disease ([place]) Her last HIV viral load was undetectable. She will continue  anti-retroviral treatment  DVT prophylaxis We'll start her on Lovenox and encourage ambulation  CODE STATUS Full code  Discharge planning Likely on 06/05/2016 when she completes treatment  All questions were answered. The patient knows to call the clinic with any problems, questions or concerns.    Heath Lark, MD 06/03/2016 8:58 AM

## 2016-06-03 NOTE — Progress Notes (Signed)
Patient tolerated Etoposide infusion without any problems.  Will continue to monitor.

## 2016-06-03 NOTE — Progress Notes (Signed)
Chemotherapy consent signed.  Reviewed side effects and symptom management associated with Etoposide, Carboplatin, and Ifosfamide.

## 2016-06-03 NOTE — Progress Notes (Signed)
ETOPOSIDE dose and dilution verified with Aldean Baker, RN.

## 2016-06-04 ENCOUNTER — Other Ambulatory Visit: Payer: Self-pay | Admitting: Hematology and Oncology

## 2016-06-04 DIAGNOSIS — G4700 Insomnia, unspecified: Secondary | ICD-10-CM

## 2016-06-04 MED ORDER — ETOPOSIDE CHEMO INJECTION 1 GM/50ML
50.0000 mg/m2 | Freq: Once | INTRAVENOUS | Status: AC
  Administered 2016-06-04: 70 mg via INTRAVENOUS
  Filled 2016-06-04: qty 3.5

## 2016-06-04 MED ORDER — SODIUM CHLORIDE 0.9 % IV SOLN
Freq: Once | INTRAVENOUS | Status: AC
  Administered 2016-06-04: 15:00:00 via INTRAVENOUS
  Filled 2016-06-04: qty 74

## 2016-06-04 MED ORDER — SODIUM CHLORIDE 0.9 % IV SOLN
469.0000 mg | Freq: Once | INTRAVENOUS | Status: AC
  Administered 2016-06-04: 470 mg via INTRAVENOUS
  Filled 2016-06-04: qty 47

## 2016-06-04 MED ORDER — SODIUM CHLORIDE 0.9 % IV SOLN
Freq: Once | INTRAVENOUS | Status: AC
  Administered 2016-06-04: 16 mg via INTRAVENOUS
  Filled 2016-06-04: qty 8

## 2016-06-04 MED ORDER — TEMAZEPAM 7.5 MG PO CAPS
7.5000 mg | ORAL_CAPSULE | Freq: Every day | ORAL | Status: DC
Start: 2016-06-04 — End: 2016-06-05
  Administered 2016-06-05: 7.5 mg via ORAL
  Filled 2016-06-04: qty 1

## 2016-06-04 MED ORDER — LORAZEPAM 0.5 MG PO TABS
0.5000 mg | ORAL_TABLET | Freq: Once | ORAL | Status: AC
  Administered 2016-06-04: 0.5 mg via ORAL
  Filled 2016-06-04: qty 1

## 2016-06-04 NOTE — Progress Notes (Signed)
[name]   DOB:1985/07/21   [id number]    Subjective: She tolerated cycle 1, day to well. She complained of problem with sleep last night. She denies nausea or vomiting. No changes in bowel habits  Objective:  Vitals:   06/03/16 2115 06/04/16 0551  BP: 103/68 (!) 99/59  Pulse: (!) 51 (!) 59  Resp: 20 20  Temp: 97.7 F (36.5 C) 98.6 F (37 C)     Intake/Output Summary (Last 24 hours) at 06/04/16 1327 Last data filed at 06/04/16 1011  Gross per 24 hour  Intake           595.83 ml  Output                0 ml  Net           595.83 ml    GENERAL:alert, no distress and comfortable SKIN: skin color, texture, turgor are normal, no rashes or significant lesions EYES: normal, Conjunctiva are pink and non-injected, sclera clear OROPHARYNX:no exudate, no erythema and lips, buccal mucosa, and tongue normal  NECK: supple, thyroid normal size, non-tender, without nodularity LYMPH:  no palpable lymphadenopathy in the cervical, axillary or inguinal LUNGS: clear to auscultation and percussion with normal breathing effort HEART: regular rate & rhythm and no murmurs and no lower extremity edema ABDOMEN:abdomen soft, non-tender and normal bowel sounds Musculoskeletal:no cyanosis of digits and no clubbing  NEURO: alert & oriented x 3 with fluent speech, no focal motor/sensory deficits   Labs:  Lab Results  Component Value Date   WBC 5.9 06/02/2016   HGB 15.4 06/02/2016   HCT 45.9 06/02/2016   MCV 102.9 (H) 06/02/2016   PLT 238 06/02/2016   NEUTROABS 3.4 06/02/2016    Lab Results  Component Value Date   NA 138 06/02/2016   K 3.9 06/02/2016   CL 103 05/26/2016   CO2 25 06/02/2016     Assessment & Plan:   Hodgkin lymphoma, 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 She has relapsed stage IV disease. She will start cycle 1, day 2 of ICEchemotherapy today Per discussion with pharmacist, will reduce the dose of treatment a little bit and recheck  blood work tomorrow After that, she will return next week on every 06/08/2016 for G-CSF support. She will come back on 06/15/16 for blood draw &possible transfusion support. She has received Lupron injection 06/02/16 to suppress her ovarian function We discussed the role of bone marrow transplant and she agreed for referral to [facility]. I plan to repeat PET CT scan of the 2 cycles of treatment.  Abnormal liver function tests She has recurrent elevated liver enzymes with elevated total bilirubin Liver parenchyma on PET CT scan is not abnormal Other liver enzymes adequate We'll plan mild dose adjustment and repeat liver function test tomorrow  Human immunodeficiency virus (HIV) disease ([place]) Her last HIV viral load was undetectable. She will continue anti-retroviral treatment  DVT prophylaxis We'll start her on Lovenox and encourage ambulation  CODE STATUS Full code  Insomnia We'll try temazepam tonight  Discharge planning Likely on 06/05/2016 when she completes treatment  Heath Lark, MD 06/04/2016  1:27 PM

## 2016-06-04 NOTE — Progress Notes (Signed)
Doses and dilutions verified for Etoposide, Carboplatin and Ifosfamide/[place] with Kirkland Hun.

## 2016-06-05 LAB — COMPREHENSIVE METABOLIC PANEL
ALK PHOS: 68 U/L (ref 38–126)
ALT: 15 U/L (ref 14–54)
AST: 14 U/L — ABNORMAL LOW (ref 15–41)
Albumin: 3.7 g/dL (ref 3.5–5.0)
Anion gap: 7 (ref 5–15)
BILIRUBIN TOTAL: 1.2 mg/dL (ref 0.3–1.2)
BUN: 13 mg/dL (ref 6–20)
CALCIUM: 9 mg/dL (ref 8.9–10.3)
CO2: 24 mmol/L (ref 22–32)
CREATININE: 0.83 mg/dL (ref 0.44–1.00)
Chloride: 109 mmol/L (ref 101–111)
GFR calc non Af Amer: >60 mL/min (ref >60)
GLUCOSE: 143 mg/dL — AB (ref 65–99)
Potassium: 4.4 mmol/L (ref 3.5–5.1)
Sodium: 140 mmol/L (ref 135–145)
TOTAL PROTEIN: 6.8 g/dL (ref 6.5–8.1)

## 2016-06-05 LAB — CBC WITH DIFFERENTIAL/PLATELET
Basophils Absolute: 0 10*3/uL (ref 0.0–0.1)
Basophils Relative: 0 %
Eosinophils Absolute: 0 10*3/uL (ref 0.0–0.7)
Eosinophils Relative: 0 %
HEMATOCRIT: 35.2 % — AB (ref 36.0–46.0)
HEMOGLOBIN: 12.3 g/dL (ref 12.0–15.0)
LYMPHS ABS: 0.9 10*3/uL (ref 0.7–4.0)
LYMPHS PCT: 4 %
MCH: 34.5 pg — AB (ref 26.0–34.0)
MCHC: 34.9 g/dL (ref 30.0–36.0)
MCV: 98.6 fL (ref 78.0–100.0)
MONOS PCT: 3 %
Monocytes Absolute: 0.5 10*3/uL (ref 0.1–1.0)
NEUTROS ABS: 19.2 10*3/uL — AB (ref 1.7–7.7)
NEUTROS PCT: 93 %
Platelets: 222 10*3/uL (ref 150–400)
RBC: 3.57 MIL/uL — ABNORMAL LOW (ref 3.87–5.11)
RDW: 13.4 % (ref 11.5–15.5)
WBC: 20.6 10*3/uL — ABNORMAL HIGH (ref 4.0–10.5)

## 2016-06-05 LAB — LACTATE DEHYDROGENASE: LDH: 134 U/L (ref 98–192)

## 2016-06-05 LAB — URIC ACID: Uric Acid, Serum: 1.7 mg/dL — ABNORMAL LOW (ref 2.3–6.6)

## 2016-06-05 MED ORDER — ONDANSETRON HCL 40 MG/20ML IJ SOLN
Freq: Once | INTRAMUSCULAR | Status: AC
  Administered 2016-06-05: 16 mg via INTRAVENOUS
  Filled 2016-06-05: qty 8

## 2016-06-05 MED ORDER — TRAMADOL HCL 50 MG PO TABS: 50.0000 mg | ORAL_TABLET | Freq: Four times a day (QID) | ORAL | 0 refills | Status: DC | PRN

## 2016-06-05 MED ORDER — HEPARIN SOD (PORK) LOCK FLUSH 100 UNIT/ML IV SOLN
500.0000 [IU] | Freq: Once | INTRAVENOUS | Status: DC
  Filled 2016-06-05: qty 5

## 2016-06-05 MED ORDER — VITAMIN D3 25 MCG (1000 UNIT) PO TABS: 1000.0000 [IU] | ORAL_TABLET | Freq: Every day | ORAL | 9 refills | Status: DC

## 2016-06-05 MED ORDER — ETOPOSIDE CHEMO INJECTION 1 GM/50ML
100.0000 mg/m2 | Freq: Once | INTRAVENOUS | Status: AC
  Administered 2016-06-05: 150 mg via INTRAVENOUS
  Filled 2016-06-05: qty 7.5

## 2016-06-05 MED ORDER — ALLOPURINOL 300 MG PO TABS: 300.0000 mg | ORAL_TABLET | Freq: Every day | ORAL | 0 refills | Status: DC

## 2016-06-05 NOTE — Discharge Summary (Signed)
Physician Discharge Summary  Patient ID: [name] MRN: [id number] DOB/AGE: 32-Jan-1987 32 y.o.  Admit date: 06/03/2016 Discharge date: 06/05/2016  Primary Care Physician:  Scharlene Gloss, MD   Discharge Diagnoses:    Present on Admission: . 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 Human immunodeficiency virus (HIV) disease ([place]) . Abnormal liver function tests . Hodgkin lymphoma [facility])   Discharge Medications:  Allergies as of 06/05/2016      Reactions   Temazepam Other (See Comments)   disorientation      Medication List    STOP taking these medications   HYDROmorphone 2 MG tablet Commonly known as:  DILAUDID     TAKE these medications   albuterol 108 (90 Base) MCG/ACT inhaler Commonly known as:  PROVENTIL HFA;VENTOLIN HFA Inhale 2 puffs into the lungs every 4 (four) hours as needed for wheezing or shortness of breath.   allopurinol 300 MG tablet Commonly known as:  ZYLOPRIM Take 1 tablet (300 mg total) by mouth daily.   atazanavir 300 MG capsule Commonly known as:  REYATAZ Take 1 capsule (300 mg total) by mouth daily with breakfast.   cholecalciferol 1000 units tablet Commonly known as:  VITAMIN D Take 1 tablet (1,000 Units total) by mouth daily.   CLEAR EYES MAX REDNESS RELIEF OP Place 1 drop into both eyes 2 (two) times daily as needed.   ibuprofen 200 MG tablet Commonly known as:  ADVIL,MOTRIN Take 400 mg by mouth every 6 (six) hours as needed for headache, mild pain or moderate pain.   NORVIR 100 MG Tabs tablet Generic drug:  ritonavir TAKE 1 TABLET BY MOUTH EVERY MORNING WITH BREAKFAST. TAKE WITH REYATAZ   traMADol 50 MG tablet Commonly known as:  ULTRAM Take 1 tablet (50 mg total) by mouth every 4 (four) hours as needed. for pain What changed:  Another medication with the same name was added. Make sure you understand how and when to take each.   traMADol 50 MG tablet Commonly known as:  ULTRAM Take 1 tablet (50 mg total) by mouth  every 6 (six) hours as needed for moderate pain. What changed:  You were already taking a medication with the same name, and this prescription was added. Make sure you understand how and when to take each.   TRUVADA 200-300 MG tablet Generic drug:  emtricitabine-tenofovir TAKE 1 TABLET BY MOUTH EVERY DAY       Disposition and Follow-up:   Significant Diagnostic Studies:  Nm Pet Image Restag (ps) Skull Base To Thigh  Result Date: 06/02/2016 CLINICAL DATA:  Subsequent treatment strategy for nodular sclerosing lymphoma. EXAM: NUCLEAR MEDICINE PET SKULL BASE TO THIGH TECHNIQUE: 8.4 mCi F-18 FDG was injected intravenously. Full-ring PET imaging was performed from the skull base to thigh after the radiotracer. CT data was obtained and used for attenuation correction and anatomic localization. FASTING BLOOD GLUCOSE:  Value: 76 mg/dl COMPARISON:  PET-CT scan 02/14/2016 FINDINGS: NECK No lymphadenopathy in the neck. CHEST Hypermetabolic mediastinal nodes are decreased in metabolic activity. Nodes remain intensely metabolic however. For example node along the LEFT mainstem bronchus with SUV max equal 5.3 decreased from 11.9. LEFT infrahilar nodule with intense metabolic activity (SUV max equal 9.0 decreased from 12.4). No hypermetabolic axillary nodes. ABDOMEN/PELVIS Periaortic retroperitoneal adenopathy is similar to comparison exam. Example lymph node LEFT of the aorta SUV max equal 7.6 compares to 9.5. Group of lymph nodes anterior to the IVC SUV max equal 11.7 compared with SUV max 8.5. LEFT common iliac lymph  node with SUV max 11.1 compares with SUV max 8.1. Two tree in metabolic activity of obturator lymph nodes. For example LEFT upper lymph node with SUV max 6.4 decreased from 15.3. Nodes are difficult difficult define on noncontrast CT. Little intra-abdominal fat appear No abnormal metabolic activity liver or spleen. SKELETON There several new foci of intense metabolic activity within skeleton. Lesions  at T4 and T6 with SUV max equal 6.6 and 7.6 respectively. Lesion the RIGHT inferior pubic ramus with SUV max equal 6.2 is new from prior. IMPRESSION: 1. Mixed response to chemotherapy with some lymph nodes decreased in metabolic activity and some lymph nodes increase metabolic activity. Lymph node stations including mediastinum, periaortic retroperitoneum, and obturator node stations. Activity is remains relatively intense [place] 4 2. LEFT infrahilar nodule / lymph node with intense metabolic activity decreased from prior. ( [place] 4 ). 3. 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 [place] 5 Electronically Signed   By: Suzy Bouchard M.D.   On: 06/02/2016 10:08   Ir US Guide Vasc Access Right  Result Date: 05/26/2016 INDICATION: 31 year old female with recurrent nodular sclerosis Hodgkin's lymphoma. She requires venous access for chemotherapy. EXAM: IMPLANTED PORT A CATH PLACEMENT WITH ULTRASOUND AND FLUOROSCOPIC GUIDANCE MEDICATIONS: Ancef 2 gm IV; The antibiotic was administered within an appropriate time interval prior to skin puncture. ANESTHESIA/SEDATION: Versed 3 mg IV; Fentanyl 100 mcg IV; Moderate Sedation Time:  29 minutes The patient was continuously monitored during the procedure by the interventional radiology nurse under my direct supervision. FLUOROSCOPY TIME:  0 minutes, 12 seconds (1 mGy) COMPLICATIONS: None immediate. PROCEDURE: The right neck and chest was prepped with chlorhexidine, and draped in the usual sterile fashion using maximum barrier technique (cap and mask, sterile gown, sterile gloves, large sterile sheet, hand hygiene and cutaneous antiseptic). Antibiotic prophylaxis was provided with 2g Ancef administered IV one hour prior to skin incision. Local anesthesia was attained by infiltration with 1% lidocaine with epinephrine. Ultrasound demonstrated patency of the right internal jugular vein, and this was documented with an image. Under  real-time ultrasound guidance, this vein was accessed with a 21 gauge micropuncture needle and image documentation was performed. A small dermatotomy was made at the access site with an 11 scalpel. A 0.018" wire was advanced into the SVC and the access needle exchanged for a 66F micropuncture vascular sheath. The 0.018" wire was then removed and a 0.035" wire advanced into the IVC. An appropriate location for the subcutaneous reservoir was selected below the clavicle and an incision was made through the skin and underlying soft tissues. The subcutaneous tissues were then dissected using a combination of blunt and sharp surgical technique and a pocket was formed. A single lumen power injectable portacatheter was then tunneled through the subcutaneous tissues from the pocket to the dermatotomy and the port reservoir placed within the subcutaneous pocket. The venous access site was then serially dilated and a peel away vascular sheath placed over the wire. The wire was removed and the port catheter advanced into position under fluoroscopic guidance. The catheter tip is positioned in the upper right atrium. This was documented with a spot image. The portacatheter was then tested and found to flush and aspirate well. The port was flushed with saline followed by 100 units/mL heparinized saline. The pocket was then closed in two layers using first subdermal inverted interrupted absorbable sutures followed by a running subcuticular suture. The epidermis was then sealed with Dermabond. The dermatotomy at the venous access site was also  closed with a single inverted subdermal suture and the epidermis sealed with Dermabond. IMPRESSION: Successful placement of a right IJ approach Power Port with ultrasound and fluoroscopic guidance. The catheter is ready for use. Electronically Signed   By: Jacqulynn Cadet M.D.   On: 05/26/2016 11:32   Ct Biopsy  Result Date: 05/11/2016 CLINICAL DATA:  History of treated classical  Hodgkin's lymphoma. CT and PET scan now show evidence of disease recurrence/worsening with hypermetabolic mediastinal lymphadenopathy in the chest and retroperitoneal lymphadenopathy in the abdomen and pelvis. Para-aortic retroperitoneal adenopathy is down targeted for further tissue sampling. EXAM: CT GUIDED CORE BIOPSY OF RETROPERITONEAL ABDOMINAL LYMPH NODE COMPARISON:  CT of the abdomen and pelvis on 01/29/2016 and PET scan on 02/14/2016. ANESTHESIA/SEDATION: 2.0 mg IV Versed; 100 mcg IV Fentanyl Total Moderate Sedation Time:  20 minutes. The patient's level of consciousness and physiologic status were continuously monitored during the procedure by Radiology nursing. PROCEDURE: The procedure risks, benefits, and alternatives were explained to the patient. Questions regarding the procedure were encouraged and answered. The patient understands and consents to the procedure. A time-out was performed prior to initiating the procedure. The left posterior translumbar region was prepped with chlorhexidine in a sterile fashion, and a sterile drape was applied covering the operative field. A sterile gown and sterile gloves were used for the procedure. Local anesthesia was provided with 1% Lidocaine. CT was performed through the abdomen in a prone position. Under CT guidance, a 90 gauge trocar needle was advanced into the left retroperitoneum at the level of a a para-aortic lymph node. Core biopsy was performed with an 18 gauge device. Three separate core biopsy samples were submitted in saline. COMPLICATIONS: None FINDINGS: The most accessible retroperitoneal lymph node based on most recent imaging is to the left of the aorta and inferior to the left renal vein. This lymph node was targeted with solid tissue obtained. There was no immediate CT evidence of bleeding complication. The procedure was well tolerated. IMPRESSION: CT-guided core biopsy performed of left para-aortic lymphadenopathy. Electronically Signed   By:  Aletta Edouard M.D.   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 She requires venous access for chemotherapy. EXAM: IMPLANTED PORT A CATH PLACEMENT WITH ULTRASOUND AND FLUOROSCOPIC GUIDANCE MEDICATIONS: Ancef 2 gm IV; The antibiotic was administered within an appropriate time interval prior to skin puncture. ANESTHESIA/SEDATION: Versed 3 mg IV; Fentanyl 100 mcg IV; Moderate Sedation Time:  29 minutes The patient was continuously monitored during the procedure by the interventional radiology nurse under my direct supervision. FLUOROSCOPY TIME:  0 minutes, 12 seconds (1 mGy) COMPLICATIONS: None immediate. PROCEDURE: The right neck and chest was prepped with chlorhexidine, and draped in the usual sterile fashion using maximum barrier technique (cap and mask, sterile gown, sterile gloves, large sterile sheet, hand hygiene and cutaneous antiseptic). Antibiotic prophylaxis was provided with 2g Ancef administered IV one hour prior to skin incision. Local anesthesia was attained by infiltration with 1% lidocaine with epinephrine. Ultrasound demonstrated patency of the right internal jugular vein, and this was documented with an image. Under real-time ultrasound guidance, this vein was accessed with a 21 gauge micropuncture needle and image documentation was performed. A small dermatotomy was made at the access site with an 11 scalpel. A 0.018" wire was advanced into the SVC and the access needle exchanged for a 30F micropuncture vascular sheath. The 0.018" wire was then removed and a  0.035" wire advanced into the IVC. An appropriate location for the subcutaneous reservoir was selected below the clavicle and an incision was made through the skin and underlying soft tissues. The subcutaneous tissues were then dissected using a combination of blunt and sharp surgical technique and a pocket was  formed. A single lumen power injectable portacatheter was then tunneled through the subcutaneous tissues from the pocket to the dermatotomy and the port reservoir placed within the subcutaneous pocket. The venous access site was then serially dilated and a peel away vascular sheath placed over the wire. The wire was removed and the port catheter advanced into position under fluoroscopic guidance. The catheter tip is positioned in the upper right atrium. This was documented with a spot image. The portacatheter was then tested and found to flush and aspirate well. The port was flushed with saline followed by 100 units/mL heparinized saline. The pocket was then closed in two layers using first subdermal inverted interrupted absorbable sutures followed by a running subcuticular suture. The epidermis was then sealed with Dermabond. The dermatotomy at the venous access site was also closed with a single inverted subdermal suture and the epidermis sealed with Dermabond. IMPRESSION: Successful placement of a right IJ approach Power Port with ultrasound and fluoroscopic guidance. The catheter is ready for use. Electronically Signed   By: Jacqulynn Cadet M.D.   On: 05/26/2016 11:32    Discharge Laboratory Values: Lab Results  Component Value Date   WBC 20.6 (H) 06/05/2016   HGB 12.3 06/05/2016   HCT 35.2 (L) 06/05/2016   MCV 98.6 06/05/2016   PLT 222 06/05/2016   Lab Results  Component Value Date   NA 140 06/05/2016   K 4.4 06/05/2016   CL 109 06/05/2016   CO2 24 06/05/2016    Brief H and P: For complete details please refer to admission H and P, but in brief, The patient was admitted to the hospital from 06/03/2016 to 06/05/2016 for cycle 1 of ICE chemotherapy, salvage treatment for relapsed Hodgkin lymphoma.  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 She will come back on 06/15/16 for blood draw &possible transfusion support. She has receivedLupron injection 2/6/18to suppress her ovarian function We discussed the role of bone marrow transplant and [facility]. I plan to repeat PET CT scan of the 2 cycles of treatment.  Abnormal liver function tests, resolved She has recurrent elevated liver enzymes with elevated total bilirubin Liver parenchyma on PET CT scan is not abnormal Repeat liver function tests on 06/05/2016 is normal  Human immunodeficiency virus (HIV) disease ([place]) Her last HIV viral load was undetectable. She will continue anti-retroviral treatment  DVT prophylaxis We'll start her on Lovenox and encourage ambulation  CODE STATUS Full code  Insomnia She had acute confusion with temazepam. That was discontinued  Physical Exam at Discharge: BP (!) 100/49 (BP Location: Left Arm)   Pulse (!) 48   Temp 98.6 F (37 C) (Oral)   Resp 16   Ht 5\' 4"  (1.626 m)   Wt 102 lb 3.2 oz (46.4 kg)   LMP 05/21/2016   SpO2 100%   BMI 17.54 kg/m  GENERAL:alert, no distress and comfortable SKIN: skin color, texture, turgor are normal, no rashes or significant lesions EYES: normal, Conjunctiva are pink and non-injected, sclera clear OROPHARYNX:no exudate, no erythema and lips,  buccal mucosa, and tongue normal  NECK: supple, thyroid normal size, non-tender, without nodularity LYMPH:  no palpable lymphadenopathy in the cervical, axillary or inguinal LUNGS: clear to auscultation and percussion with normal breathing effort HEART: regular rate & rhythm and no murmurs and no lower extremity edema ABDOMEN:abdomen soft, non-tender and normal bowel sounds Musculoskeletal:no cyanosis of digits and no clubbing  NEURO: alert & oriented x 3 with fluent speech, no focal motor/sensory deficits  Hospital Course:  Active Problems:   Hodgkin lymphoma, nodular sclerosis ([facility])    Human immunodeficiency virus (HIV) disease ([place])   Abnormal liver function tests   Hodgkin lymphoma ([facility])   Diet:  Regular  Activity:  As tolerated  Condition at Discharge:   stable Spent 35 minutes on discharge Signed: Dr. Heath Lark [phone]  06/05/2016, 10:53 AM

## 2016-06-05 NOTE — Progress Notes (Signed)
Etoposide dose and dilution verified with Kirkland Hun, RN.

## 2016-06-08 ENCOUNTER — Ambulatory Visit (HOSPITAL_BASED_OUTPATIENT_CLINIC_OR_DEPARTMENT_OTHER): Payer: Medicaid Other

## 2016-06-08 ENCOUNTER — Telehealth: Payer: Self-pay

## 2016-06-08 ENCOUNTER — Other Ambulatory Visit: Payer: Self-pay | Admitting: Internal Medicine

## 2016-06-08 VITALS — BP 113/64 | HR 88 | Temp 98.2°F | Resp 20

## 2016-06-08 DIAGNOSIS — Z5189 Encounter for other specified aftercare: Secondary | ICD-10-CM | POA: Diagnosis not present

## 2016-06-08 DIAGNOSIS — C8118 Nodular sclerosis classical Hodgkin lymphoma, lymph nodes of multiple sites: Secondary | ICD-10-CM | POA: Diagnosis present

## 2016-06-08 MED ORDER — ONDANSETRON HCL 8 MG PO TABS: 8.0000 mg | ORAL_TABLET | Freq: Three times a day (TID) | ORAL | 3 refills | Status: DC | PRN

## 2016-06-08 MED ORDER — POLYETHYLENE GLYCOL 3350 17 G PO PACK: 17.0000 g | PACK | Freq: Every day | ORAL | 3 refills | Status: DC | PRN

## 2016-06-08 MED ORDER — PROCHLORPERAZINE MALEATE 10 MG PO TABS: 10.0000 mg | ORAL_TABLET | Freq: Four times a day (QID) | ORAL | 3 refills | Status: DC | PRN

## 2016-06-08 MED ORDER — PEGFILGRASTIM INJECTION 6 MG/0.6ML ~~LOC~~
6.0000 mg | PREFILLED_SYRINGE | Freq: Once | SUBCUTANEOUS | Status: AC
  Administered 2016-06-08: 6 mg via SUBCUTANEOUS
  Filled 2016-06-08: qty 0.6

## 2016-06-08 NOTE — Telephone Encounter (Signed)
Pt called stating she started throwing up today. She came for neulasta shot this am and was fine, this started later in morning. She had cycle 1 ICE 2/7-2/9. She denies fever. She is also having constipation. She does not have stool softener/laxative at home, nor antiemetic.

## 2016-06-08 NOTE — Telephone Encounter (Signed)
Call in compazine 10 mg q 6 h prn po for nausea; zofran as back up 8 mg TID/PRN po disp 30, 3 refills Laxatives: miralax 1 packet daily, call in 30 days, 3 refills

## 2016-06-08 NOTE — Telephone Encounter (Signed)
S/w pt about the rx sent to [facility].

## 2016-06-08 NOTE — Patient Instructions (Signed)
Pegfilgrastim injection What is this medicine? PEGFILGRASTIM (PEG fil gra stim) is a long-acting granulocyte colony-stimulating factor that stimulates the growth of neutrophils, a type of white blood cell important in the body's fight against infection. It is used to reduce the incidence of fever and infection in patients with certain types of cancer who are receiving chemotherapy that affects the bone marrow, and to increase survival after being exposed to high doses of radiation. This medicine may be used for other purposes; ask your health care provider or pharmacist if you have questions. COMMON BRAND NAME(S): Neulasta What should I tell my health care provider before I take this medicine? They need to know if you have any of these conditions: -kidney disease -latex allergy -ongoing radiation therapy -sickle cell disease -skin reactions to acrylic adhesives (On-Body Injector only) -an unusual or allergic reaction to pegfilgrastim, filgrastim, other medicines, foods, dyes, or preservatives -pregnant or trying to get pregnant -breast-feeding How should I use this medicine? This medicine is for injection under the skin. If you get this medicine at home, you will be taught how to prepare and give the pre-filled syringe or how to use the On-body Injector. Refer to the patient Instructions for Use for detailed instructions. Use exactly as directed. Take your medicine at regular intervals. Do not take your medicine more often than directed. It is important that you put your used needles and syringes in a special sharps container. Do not put them in a trash can. If you do not have a sharps container, call your pharmacist or healthcare provider to get one. Talk to your pediatrician regarding the use of this medicine in children. While this drug may be prescribed for selected conditions, precautions do apply. Overdosage: If you think you have taken too much of this medicine contact a poison control  center or emergency room at once. NOTE: This medicine is only for you. Do not share this medicine with others. What if I miss a dose? It is important not to miss your dose. Call your doctor or health care professional if you miss your dose. If you miss a dose due to an On-body Injector failure or leakage, a new dose should be administered as soon as possible using a single prefilled syringe for manual use. What may interact with this medicine? Interactions have not been studied. Give your health care provider a list of all the medicines, herbs, non-prescription drugs, or dietary supplements you use. Also tell them if you smoke, drink alcohol, or use illegal drugs. Some items may interact with your medicine. This list may not describe all possible interactions. Give your health care provider a list of all the medicines, herbs, non-prescription drugs, or dietary supplements you use. Also tell them if you smoke, drink alcohol, or use illegal drugs. Some items may interact with your medicine. What should I watch for while using this medicine? You may need blood work done while you are taking this medicine. If you are going to need a MRI, CT scan, or other procedure, tell your doctor that you are using this medicine (On-Body Injector only). What side effects may I notice from receiving this medicine? Side effects that you should report to your doctor or health care professional as soon as possible: -allergic reactions like skin rash, itching or hives, swelling of the face, lips, or tongue -dizziness -fever -pain, redness, or irritation at site where injected -pinpoint red spots on the skin -red or dark-brown urine -shortness of breath or breathing problems -stomach or   side pain, or pain at the shoulder -swelling -tiredness -trouble passing urine or change in the amount of urine Side effects that usually do not require medical attention (report to your doctor or health care professional if they  continue or are bothersome): -bone pain -muscle pain This list may not describe all possible side effects. Call your doctor for medical advice about side effects. You may report side effects to FDA at 1-800-FDA-1088. Where should I keep my medicine? Keep out of the reach of children. Store pre-filled syringes in a refrigerator between 2 and 8 degrees C (36 and 46 degrees F). Do not freeze. Keep in carton to protect from light. Throw away this medicine if it is left out of the refrigerator for more than 48 hours. Throw away any unused medicine after the expiration date. NOTE: This sheet is a summary. It may not cover all possible information. If you have questions about this medicine, talk to your doctor, pharmacist, or health care provider.  2017 Elsevier/Gold Standard (2014-05-03 14:30:14)  

## 2016-06-11 ENCOUNTER — Telehealth: Payer: Self-pay

## 2016-06-11 NOTE — Telephone Encounter (Signed)
Per Kellie Simmering with Dr. Cassell Clement office at [facility] with [facility]. Relayed information to patient that she has appointment with Dr. Cassell Clement on 3-14, she should arrive at 12:45 for her appointment. New patient package was mailed out yesterday from there office. Patient Verbalized understanding.

## 2016-06-12 ENCOUNTER — Telehealth: Payer: Self-pay | Admitting: *Deleted

## 2016-06-12 ENCOUNTER — Telehealth: Payer: Self-pay

## 2016-06-12 NOTE — Telephone Encounter (Signed)
Patient called back, received message that she is complaining of sore throat with no fever. She has a cough at times. Per Dr. Alvy Bimler, instructed patient to try Cepacol spray and lozenges for her sore throat. Reminded her of up coming appointments. Verbalized understanding.

## 2016-06-12 NOTE — Telephone Encounter (Signed)
Pt called reporting having sore throat today, and wanting to know what pt can take for the problem.  Noted pt received Neulasta on 06/08/16.  Denied fever, denied runny nose, has productive cough with yellow sputum.   Pt wanted to know what she could take. Dr. Alvy Bimler notified.

## 2016-06-15 ENCOUNTER — Other Ambulatory Visit (HOSPITAL_BASED_OUTPATIENT_CLINIC_OR_DEPARTMENT_OTHER): Payer: Medicaid Other

## 2016-06-15 ENCOUNTER — Other Ambulatory Visit: Payer: Self-pay | Admitting: Hematology and Oncology

## 2016-06-15 ENCOUNTER — Telehealth: Payer: Self-pay | Admitting: *Deleted

## 2016-06-15 DIAGNOSIS — C8118 Nodular sclerosis classical Hodgkin lymphoma, lymph nodes of multiple sites: Secondary | ICD-10-CM

## 2016-06-15 LAB — CBC WITH DIFFERENTIAL/PLATELET
BASO%: 1.7 % (ref 0.0–2.0)
Basophils Absolute: 0.3 10*3/uL — ABNORMAL HIGH (ref 0.0–0.1)
EOS ABS: 0.1 10*3/uL (ref 0.0–0.5)
EOS%: 0.6 % (ref 0.0–7.0)
HCT: 36.5 % (ref 34.8–46.6)
HGB: 12.8 g/dL (ref 11.6–15.9)
LYMPH%: 16.7 % (ref 14.0–49.7)
MCH: 34.6 pg — AB (ref 25.1–34.0)
MCHC: 35.1 g/dL (ref 31.5–36.0)
MCV: 98.6 fL (ref 79.5–101.0)
MONO#: 1.8 10*3/uL — ABNORMAL HIGH (ref 0.1–0.9)
MONO%: 12.3 % (ref 0.0–14.0)
NEUT#: 10.1 10*3/uL — ABNORMAL HIGH (ref 1.5–6.5)
NEUT%: 68.7 % (ref 38.4–76.8)
Platelets: 64 10*3/uL — ABNORMAL LOW (ref 145–400)
RBC: 3.7 10*6/uL (ref 3.70–5.45)
RDW: 13 % (ref 11.2–14.5)
WBC: 14.8 10*3/uL — ABNORMAL HIGH (ref 3.9–10.3)
lymph#: 2.5 10*3/uL (ref 0.9–3.3)

## 2016-06-15 LAB — COMPREHENSIVE METABOLIC PANEL
ALBUMIN: 3.6 g/dL (ref 3.5–5.0)
ALT: 24 U/L (ref 0–55)
AST: 25 U/L (ref 5–34)
Alkaline Phosphatase: 112 U/L (ref 40–150)
Anion Gap: 8 mEq/L (ref 3–11)
BUN: 5.7 mg/dL — AB (ref 7.0–26.0)
CHLORIDE: 106 meq/L (ref 98–109)
CO2: 25 mEq/L (ref 22–29)
CREATININE: 0.9 mg/dL (ref 0.6–1.1)
Calcium: 9 mg/dL (ref 8.4–10.4)
EGFR: >90 mL/min/{1.73_m2} (ref >90)
GLUCOSE: 60 mg/dL — AB (ref 70–140)
POTASSIUM: 3.6 meq/L (ref 3.5–5.1)
SODIUM: 139 meq/L (ref 136–145)
Total Bilirubin: 0.5 mg/dL (ref 0.20–1.20)
Total Protein: 6.8 g/dL (ref 6.4–8.3)

## 2016-06-15 NOTE — Telephone Encounter (Signed)
Informed patient

## 2016-06-15 NOTE — Telephone Encounter (Signed)
Patient called stating that she received her Lupron injection on 06/02/16. Patient is now complaining of bleeding when she is wiping. Patient last period was 05/21/16.

## 2016-06-15 NOTE — Telephone Encounter (Signed)
Tell her not to worry It is not uncommon to have mild spotting

## 2016-06-16 LAB — PREGNANCY, URINE: Pregnancy Test, Urine: NEGATIVE

## 2016-06-17 ENCOUNTER — Emergency Department (HOSPITAL_COMMUNITY): Payer: Medicaid Other

## 2016-06-17 ENCOUNTER — Encounter (HOSPITAL_COMMUNITY): Payer: Self-pay | Admitting: Emergency Medicine

## 2016-06-17 ENCOUNTER — Emergency Department (HOSPITAL_COMMUNITY)
Admission: EM | Admit: 2016-06-17 | Discharge: 2016-06-18 | Disposition: A | Discharge status: Home / Self Care | Payer: Medicaid Other | Attending: Emergency Medicine | Admitting: Emergency Medicine

## 2016-06-17 DIAGNOSIS — Z8571 Personal history of Hodgkin lymphoma: Secondary | ICD-10-CM | POA: Insufficient documentation

## 2016-06-17 DIAGNOSIS — F17210 Nicotine dependence, cigarettes, uncomplicated: Secondary | ICD-10-CM | POA: Insufficient documentation

## 2016-06-17 DIAGNOSIS — R05 Cough: Secondary | ICD-10-CM | POA: Diagnosis present

## 2016-06-17 DIAGNOSIS — R220 Localized swelling, mass and lump, head: Secondary | ICD-10-CM | POA: Diagnosis not present

## 2016-06-17 DIAGNOSIS — R221 Localized swelling, mass and lump, neck: Secondary | ICD-10-CM

## 2016-06-17 LAB — RAPID STREP SCREEN (MED CTR MEBANE ONLY): Streptococcus, Group A Screen (Direct): NEGATIVE

## 2016-06-17 MED ORDER — BENZONATATE 100 MG PO CAPS: 100.0000 mg | ORAL_CAPSULE | Freq: Three times a day (TID) | ORAL | 0 refills | Status: DC

## 2016-06-17 MED ORDER — BENZONATATE 100 MG PO CAPS
200.0000 mg | ORAL_CAPSULE | Freq: Once | ORAL | Status: AC
  Administered 2016-06-17: 200 mg via ORAL
  Filled 2016-06-17: qty 2

## 2016-06-17 MED ORDER — AMOXICILLIN-POT CLAVULANATE 875-125 MG PO TABS
1.0000 | ORAL_TABLET | Freq: Once | ORAL | Status: AC
  Administered 2016-06-17: 1 via ORAL
  Filled 2016-06-17: qty 1

## 2016-06-17 MED ORDER — AMOXICILLIN-POT CLAVULANATE 875-125 MG PO TABS: 1.0000 | ORAL_TABLET | Freq: Two times a day (BID) | ORAL | 0 refills | Status: DC

## 2016-06-17 NOTE — ED Triage Notes (Addendum)
Pt from home with complaints of sore throat that began 2 hours ago and cough x 2 days. Pt states she has been coughing up yellow mucous. Pt is not febrile nor is she tachycardic. Pt has been taking mucinex at home.  Pt has lymphoma and her last treatment 2/7-2/9
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
## 2016-06-18 ENCOUNTER — Telehealth: Payer: Self-pay | Admitting: *Deleted

## 2016-06-18 ENCOUNTER — Other Ambulatory Visit: Payer: Self-pay | Admitting: Hematology and Oncology

## 2016-06-18 DIAGNOSIS — C8118 Nodular sclerosis classical Hodgkin lymphoma, lymph nodes of multiple sites: Secondary | ICD-10-CM

## 2016-06-18 NOTE — Telephone Encounter (Signed)
Patient calling to say she was in the E.R. Last night with swelling on one side of her neck. Was given antibiotics. States she has only taken 1 dose and neck is still swollen. Encouraged her to continue with antibiotics. If swelling does not go down or symptoms worsen, to call back.

## 2016-06-18 NOTE — Discharge Instructions (Signed)
Please follow with your primary care doctor in the next 2 days for a check-up. They must obtain records for further management.  ° °Do not hesitate to return to the Emergency Department for any new, worsening or concerning symptoms.  ° °

## 2016-06-20 LAB — CULTURE, GROUP A STREP (THRC)

## 2016-06-23 ENCOUNTER — Other Ambulatory Visit (HOSPITAL_BASED_OUTPATIENT_CLINIC_OR_DEPARTMENT_OTHER): Payer: Medicaid Other

## 2016-06-23 ENCOUNTER — Encounter: Payer: Self-pay | Admitting: Hematology and Oncology

## 2016-06-23 ENCOUNTER — Telehealth: Payer: Self-pay | Admitting: Hematology and Oncology

## 2016-06-23 ENCOUNTER — Ambulatory Visit (HOSPITAL_BASED_OUTPATIENT_CLINIC_OR_DEPARTMENT_OTHER): Payer: Medicaid Other | Admitting: Hematology and Oncology

## 2016-06-23 VITALS — BP 109/67 | HR 62 | Temp 98.2°F | Resp 18 | Ht 64.0 in | Wt 100.8 lb

## 2016-06-23 DIAGNOSIS — B009 Herpesviral infection, unspecified: Secondary | ICD-10-CM | POA: Diagnosis not present

## 2016-06-23 DIAGNOSIS — C8118 Nodular sclerosis classical Hodgkin lymphoma, lymph nodes of multiple sites: Secondary | ICD-10-CM

## 2016-06-23 DIAGNOSIS — C8198 Hodgkin lymphoma, unspecified, lymph nodes of multiple sites: Secondary | ICD-10-CM

## 2016-06-23 DIAGNOSIS — B20 Human immunodeficiency virus [HIV] disease: Secondary | ICD-10-CM | POA: Diagnosis not present

## 2016-06-23 DIAGNOSIS — R590 Localized enlarged lymph nodes: Secondary | ICD-10-CM | POA: Insufficient documentation

## 2016-06-23 LAB — CBC WITH DIFFERENTIAL/PLATELET
BASO%: 0.2 % (ref 0.0–2.0)
BASOS ABS: 0 10*3/uL (ref 0.0–0.1)
EOS%: 0.3 % (ref 0.0–7.0)
Eosinophils Absolute: 0 10*3/uL (ref 0.0–0.5)
HEMATOCRIT: 36.6 % (ref 34.8–46.6)
HGB: 12.6 g/dL (ref 11.6–15.9)
LYMPH#: 1.9 10*3/uL (ref 0.9–3.3)
LYMPH%: 28.5 % (ref 14.0–49.7)
MCH: 34.3 pg — AB (ref 25.1–34.0)
MCHC: 34.4 g/dL (ref 31.5–36.0)
MCV: 99.7 fL (ref 79.5–101.0)
MONO#: 0.7 10*3/uL (ref 0.1–0.9)
MONO%: 10 % (ref 0.0–14.0)
NEUT#: 4 10*3/uL (ref 1.5–6.5)
NEUT%: 61 % (ref 38.4–76.8)
PLATELETS: 328 10*3/uL (ref 145–400)
RBC: 3.68 10*6/uL — ABNORMAL LOW (ref 3.70–5.45)
RDW: 13.6 % (ref 11.2–14.5)
WBC: 6.6 10*3/uL (ref 3.9–10.3)

## 2016-06-23 LAB — COMPREHENSIVE METABOLIC PANEL
ALT: 29 U/L (ref 0–55)
AST: 21 U/L (ref 5–34)
Albumin: 3.8 g/dL (ref 3.5–5.0)
Alkaline Phosphatase: 93 U/L (ref 40–150)
Anion Gap: 8 mEq/L (ref 3–11)
BILIRUBIN TOTAL: 0.68 mg/dL (ref 0.20–1.20)
BUN: 12 mg/dL (ref 7.0–26.0)
CHLORIDE: 107 meq/L (ref 98–109)
CO2: 28 mEq/L (ref 22–29)
Calcium: 9.8 mg/dL (ref 8.4–10.4)
Creatinine: 0.8 mg/dL (ref 0.6–1.1)
GLUCOSE: 78 mg/dL (ref 70–140)
POTASSIUM: 3.9 meq/L (ref 3.5–5.1)
Sodium: 143 mEq/L (ref 136–145)
TOTAL PROTEIN: 7.3 g/dL (ref 6.4–8.3)

## 2016-06-23 MED ORDER — ACYCLOVIR 5 % EX OINT: 1.0000 "application " | TOPICAL_OINTMENT | CUTANEOUS | 0 refills | Status: DC

## 2016-06-23 NOTE — Progress Notes (Signed)
[facility] OFFICE PROGRESS NOTE  Patient Care Team: Robert W Comer, MD as PCP - General (Internal Medicine) Robert W Comer, MD as PCP - Infectious Diseases (Infectious Diseases) James G Arnold, MD as Consulting Physician (Obstetrics and Gynecology) Michael B Wert, MD as Consulting Physician (Pulmonary Disease) Steven C Hendrickson, MD as Consulting Physician (Cardiothoracic Surgery)  SUMMARY OF ONCOLOGIC HISTORY:   Hodgkin lymphoma, nodular sclerosis (HCC)   05/06/2014 Imaging    CT scan of the abdomen show diffuse mesenteric lymphadenopathy.      05/07/2014 Imaging    CT scan of the chest show right thoracic inlet lymphadenopathy      06/07/2014 Procedure    She underwent ultrasound-guided core biopsy of the neck lymph node      06/07/2014 Pathology Results    Accession: [id number] biopsy confirmed diagnosis of Hodgkin lymphoma.      06/15/2014 Imaging    Echocardiogram showed preserved ejection fraction      07/09/2014 - 07/12/2014 Hospital Admission    She was admitted to the hospital for severe anemia.      07/27/2014 Procedure    She had placement of port      07/31/2014 - 09/11/2014 Chemotherapy    She received dose adjusted chemotherapy due to abnormal liver function tests and severe anemia. Treatment was delayed due to noncompliance  and subsequently stopped because the patient failed to keep appointments      01/11/2015 Imaging    Repeat PET CT scan showed response to treatment      01/28/2015 - 06/18/2015 Chemotherapy    ABVD was restarted with full dose.      02/08/2015 - 02/10/2015 Hospital Admission    The patient was admitted to the hospital due to pancytopenia and profuse diarrhea. Cultures were negative. She was placed on ciprofloxacin.      02/11/2015 Adverse Reaction    Treatment was placed on hold due to recent infection.      04/11/2015 Imaging    PET CT scan showed near complete response. Incidental finding of an abnormal bone lesion,  indeterminate. She is not symptomatic. Recommendation from [facility] to observe      07/11/2015 Imaging    PET CT scan showed abnormal new bone lesions, suggestive of possible disease progression      07/23/2015 Bone Marrow Biopsy    She underwent bone biopsy      07/23/2015 Pathology Results    Accession: [id number]  biopsy was negative for cancer      11/21/2015 Surgery    She had surgery for ectopic pregnancy      01/29/2016 Imaging    Ct chest, abdomen and pelvis showed pelvic and retroperitoneal lymphadenopathy, as above, concerning for residual disease. There is also a mildly enlarged posterior mediastinal lymph node measuring 11 mm adjacent to the distal descending thoracic aorta. This may represent an additional focus of disease, but is the only finding of concern in the thorax on today's examination. Sclerosis in the right ilium at site of previously noted metabolically active lesion, grossly unchanged. No other definite osseous lesions are identified on today's examination. Spleen is normal in size and appearance.      02/14/2016 PET scan    Interval disease worsening with new foci of hypermetabolic activity in multiple retroperitoneal and pelvic lymph nodes as well as AP window and left hilar lymph nodes. (Deauville 5). There is also overall worsening of the osseous disease.      05/11/2016 Pathology Results      Diagnosis Lymph node, needle/core biopsy, Left para-aortic retroperitoneal - CLASSICAL HODGKIN LYMPHOMA. - SEE ONCOLOGY TABLE. Microscopic Comment LYMPHOMA Histologic type: Classical Hodgkin lymphoma. Grade (if applicable): N/A Flow cytometry: Not done. Immunohistochemical stains: CD15, CD20, CD3, LCA, PAX-5, CD30 with appropriate controls. Touch preps/imprints: Not performed. Comments: The sections show small needle core biopsy fragments displaying a polymorphous cellular proliferation of small lymphocytes, plasma cells, eosinophils, and large atypical mononuclear and  multilobated lymphoid cells with features of Reed-Sternberg cells and variants. This is associated with patchy areas of fibrosis. Immunohistochemical stains were performed and show that the large atypical lymphoid cells are positive for CD30, CD15 and PAX-5 and negative for LCA, CD20, CD3. The small lymphoid cells in the background show a mixture of T and B cells with predominance of T cells. The overall morphologic and histologic features are consistent with classical Hodgkin lymphoma. Further subtyping is challenging in limited small biopsy fragments but the patchy fibrosis suggests nodular sclerosis type.       05/11/2016 Procedure    She underwent CT guided biopsy of retroperitoneal lymph node      05/26/2016 Procedure    Successful placement of a right IJ approach Power Port with ultrasound and fluoroscopic guidance. The catheter is ready for use.      06/02/2016 PET scan    Mixed response to chemotherapy with some lymph nodes decreased in metabolic activity and some lymph nodes increase metabolic activity. Lymph node stations including mediastinum, periaortic retroperitoneum, and obturator node stations. Activity is remains relatively intense Deauville 4 2. LEFT infrahilar nodule / lymph node with intense metabolic activity decreased from prior. ( Deauville 4 ). 3. 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 Deauville 5      06/03/2016 - 06/05/2016 Hospital Admission    She was admitted to the hospital for cycle 1 of ICE chemotherapy       INTERVAL HISTORY: Please see below for problem oriented charting. She returns for follow-up. She had profound nausea and vomiting for several days after chemotherapy, resolved. She noticed new lymphadenopathy in the submandibular region.  She had recent cold sore and upper respiratory tract infection, resolved. She denies lymphadenopathy elsewhere.  REVIEW OF SYSTEMS:   Constitutional: Denies fevers, chills or abnormal  weight loss Eyes: Denies blurriness of vision Ears, nose, mouth, throat, and face: Denies mucositis or sore throat Respiratory: Denies cough, dyspnea or wheezes Cardiovascular: Denies palpitation, chest discomfort or lower extremity swelling Gastrointestinal:  Denies nausea, heartburn or change in bowel habits Skin: Denies abnormal skin rashes Neurological:Denies numbness, tingling or new weaknesses Behavioral/Psych: Mood is stable, no new changes  All other systems were reviewed with the patient and are negative.  I have reviewed the past medical history, past surgical history, social history and family history with the patient and they are unchanged from previous note.  ALLERGIES:  is allergic to temazepam.  MEDICATIONS:  Current Outpatient Prescriptions  Medication Sig Dispense Refill  . acyclovir ointment (ZOVIRAX) 5 % Apply 1 application topically every 3 (three) hours. 15 g 0  . albuterol (PROVENTIL HFA;VENTOLIN HFA) 108 (90 Base) MCG/ACT inhaler Inhale 2 puffs into the lungs every 4 (four) hours as needed for wheezing or shortness of breath. 1 Inhaler 0  . allopurinol (ZYLOPRIM) 300 MG tablet Take 1 tablet (300 mg total) by mouth daily. 30 tablet 0  . amoxicillin-clavulanate (AUGMENTIN) 875-125 MG tablet Take 1 tablet by mouth 2 (two) times daily. One po bid x 7 days 14 tablet 0  . atazanavir (  REYATAZ) 300 MG capsule TAKE ONE CAPSULE BY MOUTH EVERY MORNING WITH BREAKFAST. TAKE WITH NORVIR 30 capsule 3  . benzonatate (TESSALON) 100 MG capsule Take 1 capsule (100 mg total) by mouth every 8 (eight) hours. 21 capsule 0  . cholecalciferol (VITAMIN D) 1000 units tablet Take 1 tablet (1,000 Units total) by mouth daily. 30 tablet 9  . ibuprofen (ADVIL,MOTRIN) 200 MG tablet Take 400 mg by mouth every 6 (six) hours as needed for headache, mild pain or moderate pain.    . Naphazoline-Glycerin (CLEAR EYES MAX REDNESS RELIEF OP) Place 1 drop into both eyes 2 (two) times daily as needed.    .  NORVIR 100 MG TABS tablet TAKE 1 TABLET BY MOUTH EVERY MORNING WITH BREAKFAST. TAKE WITH REYATAZ 30 tablet 5  . ondansetron (ZOFRAN) 8 MG tablet Take 1 tablet (8 mg total) by mouth every 8 (eight) hours as needed for nausea or vomiting. After trying compazine 30 tablet 3  . polyethylene glycol (MIRALAX) packet Take 17 g by mouth daily as needed for mild constipation. 30 each 3  . prochlorperazine (COMPAZINE) 10 MG tablet Take 1 tablet (10 mg total) by mouth every 6 (six) hours as needed for nausea or vomiting. 30 tablet 3  . traMADol (ULTRAM) 50 MG tablet Take 1 tablet (50 mg total) by mouth every 4 (four) hours as needed. for pain 90 tablet 0  . traMADol (ULTRAM) 50 MG tablet Take 1 tablet (50 mg total) by mouth every 6 (six) hours as needed for moderate pain. 60 tablet 0  . TRUVADA 200-300 MG tablet TAKE 1 TABLET BY MOUTH EVERY DAY 30 tablet 5   No current facility-administered medications for this visit.     PHYSICAL EXAMINATION: ECOG PERFORMANCE STATUS: 1 - Symptomatic but completely ambulatory  Vitals:   06/23/16 1249  BP: 109/67  Pulse: 62  Resp: 18  Temp: 98.2 F (36.8 C)   Filed Weights   06/23/16 1249  Weight: 100 lb 12.8 oz (45.7 kg)    GENERAL:alert, no distress and comfortable.  She looks thin and cachectic SKIN: skin color, texture, turgor are normal, no rashes or significant lesions EYES: normal, Conjunctiva are pink and non-injected, sclera clear OROPHARYNX: No mucositis.  Noted cold sore near her right lower lip NECK: supple, thyroid normal size, non-tender, without nodularity LYMPH: She has palpable lymphadenopathy in the left submandibular region LUNGS: clear to auscultation and percussion with normal breathing effort HEART: regular rate & rhythm and no murmurs and no lower extremity edema ABDOMEN:abdomen soft, non-tender and normal bowel sounds Musculoskeletal:no cyanosis of digits and no clubbing  NEURO: alert & oriented x 3 with fluent speech, no focal  motor/sensory deficits  LABORATORY DATA:  I have reviewed the data as listed    Component Value Date/Time   NA 143 06/23/2016 1232   K 3.9 06/23/2016 1232   CL 109 06/05/2016 0434   CO2 28 06/23/2016 1232   GLUCOSE 78 06/23/2016 1232   BUN 12.0 06/23/2016 1232   CREATININE 0.8 06/23/2016 1232   CALCIUM 9.8 06/23/2016 1232   PROT 7.3 06/23/2016 1232   ALBUMIN 3.8 06/23/2016 1232   AST 21 06/23/2016 1232   ALT 29 06/23/2016 1232   ALKPHOS 93 06/23/2016 1232   BILITOT 0.68 06/23/2016 1232   GFRNONAA >60 06/05/2016 0434   GFRNONAA 83 02/20/2016 0908   GFRAA >60 06/05/2016 0434   GFRAA >89 02/20/2016 0908    No results found for: SPEP, UPEP  Lab Results  Component Value  Date   WBC 6.6 06/23/2016   NEUTROABS 4.0 06/23/2016   HGB 12.6 06/23/2016   HCT 36.6 06/23/2016   MCV 99.7 06/23/2016   PLT 328 06/23/2016      Chemistry      Component Value Date/Time   NA 143 06/23/2016 1232   K 3.9 06/23/2016 1232   CL 109 06/05/2016 0434   CO2 28 06/23/2016 1232   BUN 12.0 06/23/2016 1232   CREATININE 0.8 06/23/2016 1232      Component Value Date/Time   CALCIUM 9.8 06/23/2016 1232   ALKPHOS 93 06/23/2016 1232   AST 21 06/23/2016 1232   ALT 29 06/23/2016 1232   BILITOT 0.68 06/23/2016 1232       RADIOGRAPHIC STUDIES: I have personally reviewed the radiological images as listed and agreed with the findings in the report. Dg Neck Soft Tissue  Result Date: 06/17/2016 CLINICAL DATA:  Left-sided neck swelling under the jaw EXAM: NECK SOFT TISSUES - 1+ VIEW COMPARISON:  PET-CT 06/02/2016 FINDINGS: Prevertebral soft tissue thickness appears normal. There is straightening of the cervical vertebral bodies. Epiglottis is normal. Subglottic trachea is patent. Soft tissue fullness in the submandibular region. No gas collection. IMPRESSION: 1. Soft tissue fullness in the submandibular region. CT neck with contrast may be obtained for further evaluation as indicated. 2. Otherwise  negative neck soft tissues. Electronically Signed   By: Donavan Foil M.D.   On: 06/17/2016 23:59   Dg Chest 2 View  Result Date: 06/17/2016 CLINICAL DATA:  Acute onset of productive cough.  Initial encounter. EXAM: CHEST  2 VIEW COMPARISON:  PET/CT performed 06/02/2016 FINDINGS: The lungs are well-aerated and clear. There is no evidence of focal opacification, pleural effusion or pneumothorax. The heart is normal in size; the mediastinal contour is within normal limits. The right-sided chest port is noted ending about the mid to distal SVC. No acute osseous abnormalities are seen. IMPRESSION: No acute cardiopulmonary process seen. Electronically Signed   By: Garald Balding M.D.   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 EXAM: NUCLEAR MEDICINE PET SKULL BASE TO THIGH TECHNIQUE: 8.4 mCi F-18 FDG was injected intravenously. Full-ring PET imaging was performed from the skull base to thigh after the radiotracer. CT data was obtained and used for attenuation correction and anatomic localization. FASTING BLOOD GLUCOSE:  Value: 76 mg/dl COMPARISON:  PET-CT scan 02/14/2016 FINDINGS: NECK No lymphadenopathy in the neck. CHEST Hypermetabolic mediastinal nodes are decreased in metabolic activity. Nodes remain intensely metabolic however. For example node along the LEFT mainstem bronchus with SUV max equal 5.3 decreased from 11.9. LEFT infrahilar nodule with intense metabolic activity (SUV max equal 9.0 decreased from 12.4). No hypermetabolic axillary nodes. ABDOMEN/PELVIS Periaortic retroperitoneal adenopathy is similar to comparison exam. Example lymph node LEFT of the aorta SUV max equal 7.6 compares to 9.5. Group of lymph nodes anterior to the IVC SUV max equal 11.7 compared with SUV max 8.5. LEFT common iliac lymph node with SUV max 11.1 compares with SUV max 8.1. Two tree in metabolic  activity of obturator lymph nodes. For example LEFT upper lymph node with SUV max 6.4 decreased from 15.3. Nodes are difficult difficult define on noncontrast CT. Little intra-abdominal fat appear No abnormal metabolic activity liver or spleen. SKELETON There several new foci of intense metabolic activity within skeleton. Lesions at T4 and T6 with SUV max equal 6.6 and 7.6  respectively. Lesion the RIGHT inferior pubic ramus with SUV max equal 6.2 is new from prior. IMPRESSION: 1. Mixed response to chemotherapy with some lymph nodes decreased in metabolic activity and some lymph nodes increase metabolic activity. Lymph node stations including mediastinum, periaortic retroperitoneum, and obturator node stations. Activity is remains relatively intense Deauville 4 2. LEFT infrahilar nodule / lymph node with intense metabolic activity decreased from prior. ( Deauville 4 ). 3. 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 Deauville 5 Electronically Signed   By: Suzy Bouchard M.D.   On: 06/02/2016 10:08   Ir US Guide Vasc Access Right  Result Date: 05/26/2016 INDICATION: 31 year old female with recurrent nodular sclerosis Hodgkin's lymphoma. She requires venous access for chemotherapy. EXAM: IMPLANTED PORT A CATH PLACEMENT WITH ULTRASOUND AND FLUOROSCOPIC GUIDANCE MEDICATIONS: Ancef 2 gm IV; The antibiotic was administered within an appropriate time interval prior to skin puncture. ANESTHESIA/SEDATION: Versed 3 mg IV; Fentanyl 100 mcg IV; Moderate Sedation Time:  29 minutes The patient was continuously monitored during the procedure by the interventional radiology nurse under my direct supervision. FLUOROSCOPY TIME:  0 minutes, 12 seconds (1 mGy) COMPLICATIONS: None immediate. PROCEDURE: The right neck and chest was prepped with chlorhexidine, and draped in the usual sterile fashion using maximum barrier technique (cap and mask, sterile gown, sterile gloves, large sterile sheet, hand  hygiene and cutaneous antiseptic). Antibiotic prophylaxis was provided with 2g Ancef administered IV one hour prior to skin incision. Local anesthesia was attained by infiltration with 1% lidocaine with epinephrine. Ultrasound demonstrated patency of the right internal jugular vein, and this was documented with an image. Under real-time ultrasound guidance, this vein was accessed with a 21 gauge micropuncture needle and image documentation was performed. A small dermatotomy was made at the access site with an 11 scalpel. A 0.018" wire was advanced into the SVC and the access needle exchanged for a 62F micropuncture vascular sheath. The 0.018" wire was then removed and a 0.035" wire advanced into the IVC. An appropriate location for the subcutaneous reservoir was selected below the clavicle and an incision was made through the skin and underlying soft tissues. The subcutaneous tissues were then dissected using a combination of blunt and sharp surgical technique and a pocket was formed. A single lumen power injectable portacatheter was then tunneled through the subcutaneous tissues from the pocket to the dermatotomy and the port reservoir placed within the subcutaneous pocket. The venous access site was then serially dilated and a peel away vascular sheath placed over the wire. The wire was removed and the port catheter advanced into position under fluoroscopic guidance. The catheter tip is positioned in the upper right atrium. This was documented with a spot image. The portacatheter was then tested and found to flush and aspirate well. The port was flushed with saline followed by 100 units/mL heparinized saline. The pocket was then closed in two layers using first subdermal inverted interrupted absorbable sutures followed by a running subcuticular suture. The epidermis was then sealed with Dermabond. The dermatotomy at the venous access site was also closed with a single inverted subdermal suture and the epidermis  sealed with Dermabond. IMPRESSION: Successful placement of a right IJ approach Power Port with ultrasound and fluoroscopic guidance. The catheter is ready for use. Electronically Signed   By: Jacqulynn Cadet M.D.   On: 05/26/2016 11:32   Ir Fluoro Guide Port Insertion Right  Result Date: 05/26/2016 INDICATION: 31 year old female with recurrent nodular sclerosis Hodgkin's lymphoma. She requires venous access for  chemotherapy. EXAM: IMPLANTED PORT A CATH PLACEMENT WITH ULTRASOUND AND FLUOROSCOPIC GUIDANCE MEDICATIONS: Ancef 2 gm IV; The antibiotic was administered within an appropriate time interval prior to skin puncture. ANESTHESIA/SEDATION: Versed 3 mg IV; Fentanyl 100 mcg IV; Moderate Sedation Time:  29 minutes The patient was continuously monitored during the procedure by the interventional radiology nurse under my direct supervision. FLUOROSCOPY TIME:  0 minutes, 12 seconds (1 mGy) COMPLICATIONS: None immediate. PROCEDURE: The right neck and chest was prepped with chlorhexidine, and draped in the usual sterile fashion using maximum barrier technique (cap and mask, sterile gown, sterile gloves, large sterile sheet, hand hygiene and cutaneous antiseptic). Antibiotic prophylaxis was provided with 2g Ancef administered IV one hour prior to skin incision. Local anesthesia was attained by infiltration with 1% lidocaine with epinephrine. Ultrasound demonstrated patency of the right internal jugular vein, and this was documented with an image. Under real-time ultrasound guidance, this vein was accessed with a 21 gauge micropuncture needle and image documentation was performed. A small dermatotomy was made at the access site with an 11 scalpel. A 0.018" wire was advanced into the SVC and the access needle exchanged for a 34F micropuncture vascular sheath. The 0.018" wire was then removed and a 0.035" wire advanced into the IVC. An appropriate location for the subcutaneous reservoir was selected below the clavicle  and an incision was made through the skin and underlying soft tissues. The subcutaneous tissues were then dissected using a combination of blunt and sharp surgical technique and a pocket was formed. A single lumen power injectable portacatheter was then tunneled through the subcutaneous tissues from the pocket to the dermatotomy and the port reservoir placed within the subcutaneous pocket. The venous access site was then serially dilated and a peel away vascular sheath placed over the wire. The wire was removed and the port catheter advanced into position under fluoroscopic guidance. The catheter tip is positioned in the upper right atrium. This was documented with a spot image. The portacatheter was then tested and found to flush and aspirate well. The port was flushed with saline followed by 100 units/mL heparinized saline. The pocket was then closed in two layers using first subdermal inverted interrupted absorbable sutures followed by a running subcuticular suture. The epidermis was then sealed with Dermabond. The dermatotomy at the venous access site was also closed with a single inverted subdermal suture and the epidermis sealed with Dermabond. IMPRESSION: Successful placement of a right IJ approach Power Port with ultrasound and fluoroscopic guidance. The catheter is ready for use. Electronically Signed   By: Jacqulynn Cadet M.D.   On: 05/26/2016 11:32    ASSESSMENT & PLAN:  Hodgkin lymphoma, nodular sclerosis ([place]) She tolerated last treatment well, complicated by nausea and vomiting. We will proceed with cycle 2 tomorrow on 06/24/2016 with strong premedication with Emend and Aloxi along with steroids. She will return to the cancer center on 06/29/2016 for Lupron injection and Neulasta. I have ordered a PET CT scan to restage the disease on 07/06/2016 and see her back on 07/07/2016 to review test results. She has a bone marrow transplant evaluation schedule at 07/08/2016. We discussed the rationale  behind bone marrow transplant. She agreed to proceed  Human immunodeficiency virus (HIV) disease ([place]) Her last HIV viral load was undetectable. She will continue anti-retroviral treatment  Herpes simplex virus (HSV) infection She has signs of cold sore on her lips.  It is healing. I recommend topical antiviral cream for her.  Submandibular lymphadenopathy She has probable  lymphadenopathy in her neck.  It could be related to recent upper respiratory tract infection. I recommend observation for now. As above, I plan to repeat PET CT scan to be done in 2 weeks to assess response to treatment   Orders Placed This Encounter  Procedures  . NM PET Image Restag (PS) Skull Base To Thigh    Standing Status:   Future    Standing Expiration Date:   07/28/2017    Order Specific Question:   Reason for exam:    Answer:   staging lymphoma, assess response to Rx    Order Specific Question:   Preferred imaging location?    Answer:   [facility]   All questions were answered. The patient knows to call the clinic with any problems, questions or concerns. No barriers to learning was detected. I spent 25 minutes counseling the patient face to face. The total time spent in the appointment was 40 minutes and more than 50% was on counseling and review of test results     Heath Lark, MD 06/23/2016 3:14 PM

## 2016-06-23 NOTE — Telephone Encounter (Signed)
Appointments scheduled per 2/27 LOS. Patient given AVS report and calendars with future scheduled appointments. °

## 2016-06-23 NOTE — Assessment & Plan Note (Signed)
She has signs of cold sore on her lips.  It is healing. I recommend topical antiviral cream for her.

## 2016-06-23 NOTE — Assessment & Plan Note (Signed)
She has probable lymphadenopathy in her neck.  It could be related to recent upper respiratory tract infection. I recommend observation for now. As above, I plan to repeat PET CT scan to be done in 2 weeks to assess response to treatment

## 2016-06-23 NOTE — Assessment & Plan Note (Signed)
She tolerated last treatment well, complicated by nausea and vomiting. We will proceed with cycle 2 tomorrow on 06/24/2016 with strong premedication with Emend and Aloxi along with steroids. She will return to the cancer center on 06/29/2016 for Lupron injection and Neulasta. I have ordered a PET CT scan to restage the disease on 07/06/2016 and see her back on 07/07/2016 to review test results. She has a bone marrow transplant evaluation schedule at 07/08/2016. We discussed the rationale behind bone marrow transplant. She agreed to proceed

## 2016-06-23 NOTE — Assessment & Plan Note (Signed)
Her last HIV viral load was undetectable. She will continue anti-retroviral treatment 

## 2016-06-24 ENCOUNTER — Encounter (HOSPITAL_COMMUNITY): Payer: Self-pay

## 2016-06-24 ENCOUNTER — Inpatient Hospital Stay (HOSPITAL_COMMUNITY)
Admission: RE | Admit: 2016-06-24 | Discharge: 2016-06-26 | DRG: 847 | Disposition: A | Discharge status: Home / Self Care | Payer: Medicaid Other | Source: Ambulatory Visit | Attending: Hematology and Oncology | Admitting: Hematology and Oncology

## 2016-06-24 DIAGNOSIS — Z9889 Other specified postprocedural states: Secondary | ICD-10-CM

## 2016-06-24 DIAGNOSIS — C8110 Nodular sclerosis classical Hodgkin lymphoma, unspecified site: Secondary | ICD-10-CM | POA: Diagnosis present

## 2016-06-24 DIAGNOSIS — Z720 Tobacco use: Secondary | ICD-10-CM | POA: Diagnosis not present

## 2016-06-24 DIAGNOSIS — B009 Herpesviral infection, unspecified: Secondary | ICD-10-CM | POA: Diagnosis present

## 2016-06-24 DIAGNOSIS — B001 Herpesviral vesicular dermatitis: Secondary | ICD-10-CM | POA: Diagnosis present

## 2016-06-24 DIAGNOSIS — R599 Enlarged lymph nodes, unspecified: Secondary | ICD-10-CM

## 2016-06-24 DIAGNOSIS — R591 Generalized enlarged lymph nodes: Secondary | ICD-10-CM

## 2016-06-24 DIAGNOSIS — Z8619 Personal history of other infectious and parasitic diseases: Secondary | ICD-10-CM | POA: Diagnosis not present

## 2016-06-24 DIAGNOSIS — Z888 Allergy status to other drugs, medicaments and biological substances status: Secondary | ICD-10-CM | POA: Diagnosis not present

## 2016-06-24 DIAGNOSIS — Z809 Family history of malignant neoplasm, unspecified: Secondary | ICD-10-CM

## 2016-06-24 DIAGNOSIS — R590 Localized enlarged lymph nodes: Secondary | ICD-10-CM | POA: Diagnosis present

## 2016-06-24 DIAGNOSIS — Z5111 Encounter for antineoplastic chemotherapy: Principal | ICD-10-CM

## 2016-06-24 DIAGNOSIS — C8190 Hodgkin lymphoma, unspecified, unspecified site: Secondary | ICD-10-CM | POA: Diagnosis present

## 2016-06-24 DIAGNOSIS — B20 Human immunodeficiency virus [HIV] disease: Secondary | ICD-10-CM | POA: Diagnosis not present

## 2016-06-24 DIAGNOSIS — C8118 Nodular sclerosis classical Hodgkin lymphoma, lymph nodes of multiple sites: Secondary | ICD-10-CM | POA: Diagnosis not present

## 2016-06-24 DIAGNOSIS — Z79899 Other long term (current) drug therapy: Secondary | ICD-10-CM | POA: Diagnosis not present

## 2016-06-24 DIAGNOSIS — F17210 Nicotine dependence, cigarettes, uncomplicated: Secondary | ICD-10-CM | POA: Diagnosis present

## 2016-06-24 LAB — PREGNANCY, URINE: Pregnancy Test, Urine: NEGATIVE

## 2016-06-24 MED ORDER — ACYCLOVIR 5 % EX OINT
1.0000 "application " | TOPICAL_OINTMENT | CUTANEOUS | Status: DC
  Administered 2016-06-24 – 2016-06-26 (×18): 1 via TOPICAL
  Filled 2016-06-24: qty 15

## 2016-06-24 MED ORDER — ACETAMINOPHEN 325 MG PO TABS
650.0000 mg | ORAL_TABLET | ORAL | Status: DC | PRN
  Administered 2016-06-24: 650 mg via ORAL
  Filled 2016-06-24: qty 2

## 2016-06-24 MED ORDER — ATAZANAVIR SULFATE 150 MG PO CAPS
300.0000 mg | ORAL_CAPSULE | Freq: Every day | ORAL | Status: DC
  Administered 2016-06-25 – 2016-06-26 (×2): 300 mg via ORAL
  Filled 2016-06-24 (×2): qty 2

## 2016-06-24 MED ORDER — ONDANSETRON HCL 4 MG PO TABS: 4.0000 mg | ORAL_TABLET | Freq: Three times a day (TID) | ORAL | Status: DC | PRN

## 2016-06-24 MED ORDER — SODIUM CHLORIDE 0.9 % IV SOLN
INTRAVENOUS | Status: DC
  Administered 2016-06-24: 10:00:00 via INTRAVENOUS

## 2016-06-24 MED ORDER — ALTEPLASE 2 MG IJ SOLR: 2.0000 mg | Freq: Once | INTRAMUSCULAR | Status: DC | PRN

## 2016-06-24 MED ORDER — ZOLPIDEM TARTRATE 5 MG PO TABS
5.0000 mg | ORAL_TABLET | Freq: Every evening | ORAL | Status: DC | PRN
  Administered 2016-06-24: 5 mg via ORAL
  Filled 2016-06-24: qty 1

## 2016-06-24 MED ORDER — SODIUM CHLORIDE 0.9 % IV SOLN
Freq: Once | INTRAVENOUS | Status: AC
  Administered 2016-06-24: 11:00:00 via INTRAVENOUS
  Filled 2016-06-24: qty 5

## 2016-06-24 MED ORDER — VITAMIN D3 25 MCG (1000 UNIT) PO TABS
1000.0000 [IU] | ORAL_TABLET | Freq: Every day | ORAL | Status: DC
  Administered 2016-06-24 – 2016-06-26 (×3): 1000 [IU] via ORAL
  Filled 2016-06-24 (×3): qty 1

## 2016-06-24 MED ORDER — SODIUM CHLORIDE 0.9% FLUSH: 3.0000 mL | INTRAVENOUS | Status: DC | PRN

## 2016-06-24 MED ORDER — SODIUM CHLORIDE 0.9 % IV SOLN
8.0000 mg | Freq: Three times a day (TID) | INTRAVENOUS | Status: DC | PRN
  Filled 2016-06-24: qty 4

## 2016-06-24 MED ORDER — ONDANSETRON 4 MG PO TBDP: 4.0000 mg | ORAL_TABLET | Freq: Three times a day (TID) | ORAL | Status: DC | PRN

## 2016-06-24 MED ORDER — TRAMADOL HCL 50 MG PO TABS
50.0000 mg | ORAL_TABLET | Freq: Four times a day (QID) | ORAL | Status: DC | PRN
  Administered 2016-06-25 – 2016-06-26 (×5): 50 mg via ORAL
  Filled 2016-06-24 (×5): qty 1

## 2016-06-24 MED ORDER — SODIUM CHLORIDE 0.9% FLUSH: 10.0000 mL | INTRAVENOUS | Status: DC | PRN

## 2016-06-24 MED ORDER — LIDOCAINE-PRILOCAINE 2.5-2.5 % EX CREA: TOPICAL_CREAM | Freq: Once | CUTANEOUS | Status: DC

## 2016-06-24 MED ORDER — ALLOPURINOL 300 MG PO TABS
300.0000 mg | ORAL_TABLET | Freq: Every day | ORAL | Status: DC
  Administered 2016-06-24 – 2016-06-26 (×3): 300 mg via ORAL
  Filled 2016-06-24 (×3): qty 1

## 2016-06-24 MED ORDER — PALONOSETRON HCL INJECTION 0.25 MG/5ML
0.2500 mg | Freq: Once | INTRAVENOUS | Status: AC
  Administered 2016-06-24: 0.25 mg via INTRAVENOUS
  Filled 2016-06-24: qty 5

## 2016-06-24 MED ORDER — ONDANSETRON HCL 4 MG/2ML IJ SOLN
4.0000 mg | Freq: Three times a day (TID) | INTRAMUSCULAR | Status: DC | PRN
  Administered 2016-06-26: 4 mg via INTRAVENOUS
  Filled 2016-06-24: qty 2

## 2016-06-24 MED ORDER — SODIUM CHLORIDE 0.9 % IV SOLN
100.0000 mg/m2 | Freq: Once | INTRAVENOUS | Status: AC
  Administered 2016-06-24: 150 mg via INTRAVENOUS
  Filled 2016-06-24: qty 7.5

## 2016-06-24 MED ORDER — HEPARIN SOD (PORK) LOCK FLUSH 100 UNIT/ML IV SOLN
500.0000 [IU] | Freq: Once | INTRAVENOUS | Status: DC | PRN
  Filled 2016-06-24: qty 5

## 2016-06-24 MED ORDER — RITONAVIR 100 MG PO TABS
100.0000 mg | ORAL_TABLET | Freq: Every day | ORAL | Status: DC
  Administered 2016-06-25 – 2016-06-26 (×2): 100 mg via ORAL
  Filled 2016-06-24 (×2): qty 1

## 2016-06-24 MED ORDER — HOT PACK MISC ONCOLOGY
1.0000 | Freq: Once | Status: AC | PRN
  Filled 2016-06-24: qty 1

## 2016-06-24 MED ORDER — ENOXAPARIN SODIUM 40 MG/0.4ML ~~LOC~~ SOLN
40.0000 mg | SUBCUTANEOUS | Status: DC
  Administered 2016-06-24 – 2016-06-26 (×3): 40 mg via SUBCUTANEOUS
  Filled 2016-06-24 (×3): qty 0.4

## 2016-06-24 MED ORDER — SENNOSIDES-DOCUSATE SODIUM 8.6-50 MG PO TABS: 1.0000 | ORAL_TABLET | Freq: Every evening | ORAL | Status: DC | PRN

## 2016-06-24 MED ORDER — HEPARIN SOD (PORK) LOCK FLUSH 100 UNIT/ML IV SOLN: 250.0000 [IU] | Freq: Once | INTRAVENOUS | Status: DC | PRN

## 2016-06-24 MED ORDER — EMTRICITABINE-TENOFOVIR DF 200-300 MG PO TABS
1.0000 | ORAL_TABLET | Freq: Every day | ORAL | Status: DC
  Administered 2016-06-25 – 2016-06-26 (×2): 1 via ORAL
  Filled 2016-06-24 (×4): qty 1

## 2016-06-24 NOTE — H&P (Signed)
[place] [facility] ADMISSION NOTE  Patient Care Team: Thayer Headings, MD as PCP - General (Internal Medicine) Thayer Headings, MD as PCP - Infectious Diseases (Infectious Diseases) Woodroe Mode, MD as Consulting Physician (Obstetrics and Gynecology) Tanda Rockers, MD as Consulting Physician (Pulmonary Disease) Melrose Nakayama, MD as Consulting Physician (Cardiothoracic Surgery)  CHIEF COMPLAINTS/PURPOSE OF ADMISSION Recurrent Hodgkin lymphoma, admitted for cycle 2 of chemotherapy  HISTORY OF PRESENTING ILLNESS:  [name] 31 y.o. 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      05/07/2014 Imaging    CT scan of the chest show right thoracic inlet lymphadenopathy      06/07/2014 Procedure    She underwent ultrasound-guided core biopsy of the neck lymph node      06/07/2014 Pathology Results    Accession: [id number] biopsy confirmed diagnosis of Hodgkin lymphoma.      06/15/2014 Imaging    Echocardiogram showed preserved ejection fraction      07/09/2014 - 07/12/2014 Hospital Admission    She was admitted to the hospital for severe anemia.      07/27/2014 Procedure    She had placement of port      07/31/2014 - 09/11/2014 Chemotherapy    She received dose adjusted chemotherapy due to abnormal liver function tests and severe anemia. Treatment was delayed due to noncompliance  and subsequently stopped because the patient failed to keep appointments      01/11/2015 Imaging    Repeat PET CT scan showed response to treatment      01/28/2015 - 06/18/2015 Chemotherapy    ABVD was restarted with full dose.      02/08/2015 - 02/10/2015 Hospital Admission    The patient was admitted to the hospital due to pancytopenia and profuse diarrhea. Cultures were negative. She was placed on  ciprofloxacin.      02/11/2015 Adverse Reaction    Treatment was placed on hold due to recent infection.      04/11/2015 Imaging    PET CT scan showed near complete response. Incidental finding of an abnormal bone lesion, indeterminate. She is not symptomatic. Recommendation from [facility] to observe      07/11/2015 Imaging    PET CT scan showed abnormal new bone lesions, suggestive of possible disease progression      07/23/2015 Bone Marrow Biopsy    She underwent bone biopsy      07/23/2015 Pathology Results    Accession: [id number]  biopsy was negative for cancer      11/21/2015 Surgery    She had surgery for ectopic pregnancy      01/29/2016 Imaging    Ct chest, abdomen and pelvis showed pelvic and retroperitoneal lymphadenopathy, as above, concerning for residual disease. There is also a mildly enlarged posterior mediastinal lymph node measuring 11 mm adjacent to the distal descending thoracic aorta. This may represent an additional focus of disease, but is the only finding of concern in the thorax on today's examination. Sclerosis in the right ilium at site of previously noted metabolically active lesion, grossly unchanged. No other definite osseous lesions are identified on today's examination. Spleen is normal in size and appearance.      02/14/2016 PET scan    Interval disease worsening with new foci of hypermetabolic activity in multiple retroperitoneal and pelvic lymph  nodes as well as AP window and left hilar lymph nodes. (Deauville 5). There is also overall worsening of the osseous disease.      05/11/2016 Pathology Results    Diagnosis Lymph node, needle/core biopsy, Left para-aortic retroperitoneal - CLASSICAL HODGKIN LYMPHOMA. - SEE ONCOLOGY TABLE. Microscopic Comment LYMPHOMA Histologic type: Classical Hodgkin lymphoma. Grade (if applicable): N/A Flow cytometry: Not done. Immunohistochemical stains: CD15, CD20, CD3, LCA, PAX-5, CD30 with appropriate  controls. Touch preps/imprints: Not performed. Comments: The sections show small needle core biopsy fragments displaying a polymorphous cellular proliferation of small lymphocytes, plasma cells, eosinophils, and large atypical mononuclear and multilobated lymphoid cells with features of Reed-Sternberg cells and variants. This is associated with patchy areas of fibrosis. Immunohistochemical stains were performed and show that the large atypical lymphoid cells are positive for CD30, CD15 and PAX-5 and negative for LCA, CD20, CD3. The small lymphoid cells in the background show a mixture of T and B cells with predominance of T cells. The overall morphologic and histologic features are consistent with classical Hodgkin lymphoma. Further subtyping is challenging in limited small biopsy fragments but the patchy fibrosis suggests nodular sclerosis type.       05/11/2016 Procedure    She underwent CT guided biopsy of retroperitoneal lymph node      05/26/2016 Procedure    Successful placement of a right IJ approach Power Port with ultrasound and fluoroscopic guidance. The catheter is ready for use.      06/02/2016 PET scan    Mixed response to chemotherapy with some lymph nodes decreased in metabolic activity and some lymph nodes increase metabolic activity. Lymph node stations including mediastinum, periaortic retroperitoneum, and obturator node stations. Activity is remains relatively intense Deauville 4 2. LEFT infrahilar nodule / lymph node with intense metabolic activity decreased from prior. ( Deauville 4 ). 3. 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 Deauville 5      06/03/2016 - 06/05/2016 Hospital Admission    She was admitted to the hospital for cycle 1 of ICE chemotherapy      She feels well.  She is ready to be admitted for cycle 2 of chemotherapy. She was not able to get her medication filled for cold sore on her mouth.  MEDICAL HISTORY:  Past Medical History:   Diagnosis Date  . AIN III (anal intraepithelial neoplasia III)   . Anemia   . Cancer ([place])    Hodgkin lymphoma  . Chest wall pain 06/27/2015  . Condyloma acuminatum in female   . Depression   . History of chronic bronchitis   . History of esophagitis    CANDIDA  . History of shingles   . HIV (human immunodeficiency virus infection) ([place])   . Hodgkin's lymphoma ([place]) 06/12/2014  . HSV (herpes simplex virus) infection   . Hypokalemia 07/17/2014  . Periodontitis, chronic   . Screening examination for venereal disease 10/30/2013    SURGICAL HISTORY: Past Surgical History:  Procedure Laterality Date  . DIAGNOSTIC LAPAROSCOPY WITH REMOVAL OF ECTOPIC PREGNANCY N/A 11/17/2015   Procedure: LAPAROSCOPY LEFT  SALPINGECTOMY SECONDARY TO LEFT ECTOPIC PREGNANCY;  Surgeon: Jonnie Kind, MD;  Location: [facility];  Service: Gynecology;  Laterality: N/A;  . DILATION AND CURETTAGE OF UTERUS  2005   MISSED AB  . EXAMINATION UNDER ANESTHESIA N/A 09/23/2012   Procedure: EXAM UNDER ANESTHESIA;  Surgeon: Adin Hector, MD;  Location[facility];  Service: General;  Laterality: N/A;  . IR GENERIC HISTORICAL  05/26/2016  IR FLUORO GUIDE PORT INSERTION RIGHT 05/26/2016 [facility]  . IR GENERIC HISTORICAL  05/26/2016   IR US GUIDE VASC ACCESS RIGHT 05/26/2016 [facility]  . LASER ABLATION CONDOLAMATA N/A 09/23/2012   Procedure: REMOVAL/ABLATION  ABLATION CONDOLAMATA WARTS;  Surgeon: Adin Hector, MD;  Location: [facility];  Service: General;  Laterality: N/A;    SOCIAL HISTORY: Social History   Social History  . Marital status: Single    Spouse name: N/A  . Number of children: N/A  . Years of education: N/A   Occupational History  . Not on file.   Social History Main Topics  . Smoking status: Current Every Day Smoker    Packs/day: 0.50    Years: 7.00    Types: Cigars, Cigarettes    Start date: 03/19/2014  . Smokeless tobacco: Never Used     Comment: she  smokes 3 Black and Mild Cigars daily  . Alcohol use No     Comment: Occasionally  . Drug use: Yes    Types: Marijuana     Comment: 2 blunts per day  . Sexual activity: Yes    Partners: Male    Birth control/ protection: None     Comment: pt. declined condoms   Other Topics Concern  . Not on file   Social History Narrative  . No narrative on file    FAMILY HISTORY: Family History  Problem Relation Age of Onset  . Cancer Maternal Aunt     unknown ca  . Cancer Maternal Grandmother     unknown ca    ALLERGIES:  is allergic to temazepam.  MEDICATIONS:  Current Facility-Administered Medications  Medication Dose Route Frequency Provider Last Rate Last Dose  . 0.9 %  sodium chloride infusion   Intravenous Continuous Jesiah Yerby, MD      . 0.9 %  sodium chloride infusion   Intravenous Continuous Heath Lark, MD      . acetaminophen (TYLENOL) tablet 650 mg  650 mg Oral Q4H PRN Heath Lark, MD      . acyclovir ointment (ZOVIRAX) 5 % 1 application  1 application Topical Q0H Caryl Fate, MD      . allopurinol (ZYLOPRIM) tablet 300 mg  300 mg Oral Daily Mirna Sutcliffe, MD      . alteplase (CATHFLO ACTIVASE) injection 2 mg  2 mg Intracatheter Once PRN Heath Lark, MD      . Derrill Memo ON 06/25/2016] atazanavir (REYATAZ) capsule 300 mg  300 mg Oral Q breakfast Heath Lark, MD      . cholecalciferol (VITAMIN D) tablet 1,000 Units  1,000 Units Oral Daily Heath Lark, MD      . Derrill Memo ON 06/25/2016] emtricitabine-tenofovir (TRUVADA) 200-300 MG per tablet 1 tablet  1 tablet Oral Daily Janiel Crisostomo, MD      . enoxaparin (LOVENOX) injection 40 mg  40 mg Subcutaneous Q24H Patra Gherardi, MD      . etoposide (VEPESID) 150 mg in sodium chloride 0.9 % 500 mL chemo infusion  100 mg/m2 (Treatment Plan Recorded) Intravenous Once Heath Lark, MD      . fosaprepitant (EMEND) 150 mg, dexamethasone (DECADRON) 12 mg in sodium chloride 0.9 % 145 mL IVPB   Intravenous Once Heath Lark, MD      . heparin lock flush 100 unit/mL  500  Units Intracatheter Once PRN Heath Lark, MD      . heparin lock flush 100 unit/mL  250 Units Intracatheter Once PRN Heath Lark, MD      .  Hot Pack 1 packet  1 packet Topical Once PRN Heath Lark, MD      . lidocaine-prilocaine (EMLA) cream   Topical Once Heath Lark, MD      . ondansetron (ZOFRAN) tablet 4-8 mg  4-8 mg Oral Q8H PRN Heath Lark, MD       Or  . ondansetron (ZOFRAN-ODT) disintegrating tablet 4-8 mg  4-8 mg Oral Q8H PRN Heath Lark, MD       Or  . ondansetron (ZOFRAN) injection 4 mg  4 mg Intravenous Q8H PRN Heath Lark, MD       Or  . ondansetron (ZOFRAN) 8 mg in sodium chloride 0.9 % 50 mL IVPB  8 mg Intravenous Q8H PRN Heath Lark, MD      . palonosetron (ALOXI) injection 0.25 mg  0.25 mg Intravenous Once Heath Lark, MD      . Derrill Memo ON 06/25/2016] ritonavir (NORVIR) tablet 100 mg  100 mg Oral Q breakfast Heath Lark, MD      . senna-docusate (Senokot-S) tablet 1 tablet  1 tablet Oral QHS PRN Heath Lark, MD      . sodium chloride flush (NS) 0.9 % injection 10 mL  10 mL Intracatheter PRN Heath Lark, MD      . sodium chloride flush (NS) 0.9 % injection 3 mL  3 mL Intravenous PRN Heath Lark, MD      . traMADol (ULTRAM) tablet 50 mg  50 mg Oral Q6H PRN Heath Lark, MD      . zolpidem (AMBIEN) tablet 5 mg  5 mg Oral QHS PRN Heath Lark, MD        REVIEW OF SYSTEMS:   Constitutional: Denies fevers, chills or abnormal night sweats Eyes: Denies blurriness of vision, double vision or watery eyes Ears, nose, mouth, throat, and face: Denies mucositis or sore throat Respiratory: Denies cough, dyspnea or wheezes Cardiovascular: Denies palpitation, chest discomfort or lower extremity swelling Gastrointestinal:  Denies nausea, heartburn or change in bowel habits Lymphatics: Denies new lymphadenopathy or easy bruising Neurological:Denies numbness, tingling or new weaknesses Behavioral/Psych: Mood is stable, no new changes  All other systems were reviewed with the patient and are  negative.  PHYSICAL EXAMINATION: ECOG PERFORMANCE STATUS: 0 - Asymptomatic  Vitals:   06/24/16 0858  BP: 104/72  Pulse: 76  Resp: 18  Temp: 98.2 F (36.8 C)   Filed Weights   06/24/16 0858  Weight: 100 lb (45.4 kg)    GENERAL:alert, no distress and comfortable.  She looks thin SKIN: Noted persistent skin lesions on her mouth EYES: normal, conjunctiva are pink and non-injected, sclera clear OROPHARYNX:no exudate, no erythema and lips, buccal mucosa, and tongue normal  NECK: supple, thyroid normal size, non-tender, without nodularity LYMPH: Palpable lymphadenopathy in her neck, stable  LUNGS: clear to auscultation and percussion with normal breathing effort HEART: regular rate & rhythm and no murmurs and no lower extremity edema ABDOMEN:abdomen soft, non-tender and normal bowel sounds Musculoskeletal:no cyanosis of digits and no clubbing  PSYCH: alert & oriented x 3 with fluent speech NEURO: no focal motor/sensory deficits  LABORATORY DATA:  I have reviewed the data as listed Lab Results  Component Value Date   WBC 6.6 06/23/2016   HGB 12.6 06/23/2016   HCT 36.6 06/23/2016   MCV 99.7 06/23/2016   PLT 328 06/23/2016    Recent Labs  04/18/16 1342 05/26/16 0729  06/05/16 0434 06/15/16 0841 06/23/16 1232  NA 138 136  < > 140 139 143  K 4.4 3.5  < >  4.4 3.6 3.9  CL 103 103  --  109  --   --   CO2 28 25  < > '24 25 28  ' GLUCOSE 86 85  < > 143* 60* 78  BUN 8 7  < > 13 5.7* 12.0  CREATININE 0.93 0.83  < > 0.83 0.9 0.8  CALCIUM 9.6 9.0  < > 9.0 9.0 9.8  GFRNONAA >60 >60  --  >60  --   --   GFRAA >60 >60  --  >60  --   --   PROT 7.7 7.6  < > 6.8 6.8 7.3  ALBUMIN 3.9 4.1  < > 3.7 3.6 3.8  AST 26 21  < > 14* 25 21  ALT 23 16  < > '15 24 29  ' ALKPHOS 74 80  < > 68 112 93  BILITOT 1.5* 1.6*  < > 1.2 0.50 0.68  < > = values in this interval not displayed.  RADIOGRAPHIC STUDIES: I have personally reviewed the radiological images as listed and agreed with the findings in  the report. Dg Neck Soft Tissue  Result Date: 06/17/2016 CLINICAL DATA:  Left-sided neck swelling under the jaw EXAM: NECK SOFT TISSUES - 1+ VIEW COMPARISON:  PET-CT 06/02/2016 FINDINGS: Prevertebral soft tissue thickness appears normal. There is straightening of the cervical vertebral bodies. Epiglottis is normal. Subglottic trachea is patent. Soft tissue fullness in the submandibular region. No gas collection. IMPRESSION: 1. Soft tissue fullness in the submandibular region. CT neck with contrast may be obtained for further evaluation as indicated. 2. Otherwise negative neck soft tissues. Electronically Signed   By: Donavan Foil M.D.   On: 06/17/2016 23:59   Dg Chest 2 View  Result Date: 06/17/2016 CLINICAL DATA:  Acute onset of productive cough.  Initial encounter. EXAM: CHEST  2 VIEW COMPARISON:  PET/CT performed 06/02/2016 FINDINGS: The lungs are well-aerated and clear. There is no evidence of focal opacification, pleural effusion or pneumothorax. The heart is normal in size; the mediastinal contour is within normal limits. The right-sided chest port is noted ending about the mid to distal SVC. No acute osseous abnormalities are seen. IMPRESSION: No acute cardiopulmonary process seen. Electronically Signed   By: Garald Balding M.D.   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 EXAM: NUCLEAR MEDICINE PET SKULL BASE TO THIGH TECHNIQUE: 8.4 mCi F-18 FDG was injected intravenously. Full-ring PET imaging was performed from the skull base to thigh after the radiotracer. CT data was obtained and used for attenuation correction and anatomic localization. FASTING BLOOD GLUCOSE:  Value: 76 mg/dl COMPARISON:  PET-CT scan 02/14/2016 FINDINGS: NECK No lymphadenopathy in the neck. CHEST Hypermetabolic mediastinal nodes are decreased in metabolic activity. Nodes remain intensely metabolic  however. For example node along the LEFT mainstem bronchus with SUV max equal 5.3 decreased from 11.9. LEFT infrahilar nodule with intense metabolic activity (SUV max equal 9.0 decreased from 12.4). No hypermetabolic axillary nodes. ABDOMEN/PELVIS Periaortic retroperitoneal adenopathy is similar to comparison exam. Example lymph node LEFT of the aorta SUV max equal 7.6 compares to 9.5. Group of lymph nodes anterior to the IVC SUV max equal 11.7 compared with SUV max 8.5. LEFT common iliac lymph node with SUV max 11.1 compares with SUV max 8.1. Two tree in metabolic activity of obturator lymph nodes. For example LEFT upper lymph node with SUV max 6.4 decreased from 15.3. Nodes  are difficult difficult define on noncontrast CT. Little intra-abdominal fat appear No abnormal metabolic activity liver or spleen. SKELETON There several new foci of intense metabolic activity within skeleton. Lesions at T4 and T6 with SUV max equal 6.6 and 7.6 respectively. Lesion the RIGHT inferior pubic ramus with SUV max equal 6.2 is new from prior. IMPRESSION: 1. Mixed response to chemotherapy with some lymph nodes decreased in metabolic activity and some lymph nodes increase metabolic activity. Lymph node stations including mediastinum, periaortic retroperitoneum, and obturator node stations. Activity is remains relatively intense Deauville 4 2. LEFT infrahilar nodule / lymph node with intense metabolic activity decreased from prior. ( Deauville 4 ). 3. 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 Deauville 5 Electronically Signed   By: Suzy Bouchard M.D.   On: 06/02/2016 10:08   Ir US Guide Vasc Access Right  Result Date: 05/26/2016 INDICATION: 32 year old female with recurrent nodular sclerosis Hodgkin's lymphoma. She requires venous access for chemotherapy. EXAM: IMPLANTED PORT A CATH PLACEMENT WITH ULTRASOUND AND FLUOROSCOPIC GUIDANCE MEDICATIONS: Ancef 2 gm IV; The antibiotic was administered  within an appropriate time interval prior to skin puncture. ANESTHESIA/SEDATION: Versed 3 mg IV; Fentanyl 100 mcg IV; Moderate Sedation Time:  29 minutes The patient was continuously monitored during the procedure by the interventional radiology nurse under my direct supervision. FLUOROSCOPY TIME:  0 minutes, 12 seconds (1 mGy) COMPLICATIONS: None immediate. PROCEDURE: The right neck and chest was prepped with chlorhexidine, and draped in the usual sterile fashion using maximum barrier technique (cap and mask, sterile gown, sterile gloves, large sterile sheet, hand hygiene and cutaneous antiseptic). Antibiotic prophylaxis was provided with 2g Ancef administered IV one hour prior to skin incision. Local anesthesia was attained by infiltration with 1% lidocaine with epinephrine. Ultrasound demonstrated patency of the right internal jugular vein, and this was documented with an image. Under real-time ultrasound guidance, this vein was accessed with a 21 gauge micropuncture needle and image documentation was performed. A small dermatotomy was made at the access site with an 11 scalpel. A 0.018" wire was advanced into the SVC and the access needle exchanged for a 47F micropuncture vascular sheath. The 0.018" wire was then removed and a 0.035" wire advanced into the IVC. An appropriate location for the subcutaneous reservoir was selected below the clavicle and an incision was made through the skin and underlying soft tissues. The subcutaneous tissues were then dissected using a combination of blunt and sharp surgical technique and a pocket was formed. A single lumen power injectable portacatheter was then tunneled through the subcutaneous tissues from the pocket to the dermatotomy and the port reservoir placed within the subcutaneous pocket. The venous access site was then serially dilated and a peel away vascular sheath placed over the wire. The wire was removed and the port catheter advanced into position under  fluoroscopic guidance. The catheter tip is positioned in the upper right atrium. This was documented with a spot image. The portacatheter was then tested and found to flush and aspirate well. The port was flushed with saline followed by 100 units/mL heparinized saline. The pocket was then closed in two layers using first subdermal inverted interrupted absorbable sutures followed by a running subcuticular suture. The epidermis was then sealed with Dermabond. The dermatotomy at the venous access site was also closed with a single inverted subdermal suture and the epidermis sealed with Dermabond. IMPRESSION: Successful placement of a right IJ approach Power Port with ultrasound and fluoroscopic guidance. The catheter is ready for  use. Electronically Signed   By: Jacqulynn Cadet M.D.   On: 05/26/2016 11:32   Ir Fluoro Guide Port Insertion Right  Result Date: 05/26/2016 INDICATION: 31 year old female with recurrent nodular sclerosis Hodgkin's lymphoma. She requires venous access for chemotherapy. EXAM: IMPLANTED PORT A CATH PLACEMENT WITH ULTRASOUND AND FLUOROSCOPIC GUIDANCE MEDICATIONS: Ancef 2 gm IV; The antibiotic was administered within an appropriate time interval prior to skin puncture. ANESTHESIA/SEDATION: Versed 3 mg IV; Fentanyl 100 mcg IV; Moderate Sedation Time:  29 minutes The patient was continuously monitored during the procedure by the interventional radiology nurse under my direct supervision. FLUOROSCOPY TIME:  0 minutes, 12 seconds (1 mGy) COMPLICATIONS: None immediate. PROCEDURE: The right neck and chest was prepped with chlorhexidine, and draped in the usual sterile fashion using maximum barrier technique (cap and mask, sterile gown, sterile gloves, large sterile sheet, hand hygiene and cutaneous antiseptic). Antibiotic prophylaxis was provided with 2g Ancef administered IV one hour prior to skin incision. Local anesthesia was attained by infiltration with 1% lidocaine with epinephrine.  Ultrasound demonstrated patency of the right internal jugular vein, and this was documented with an image. Under real-time ultrasound guidance, this vein was accessed with a 21 gauge micropuncture needle and image documentation was performed. A small dermatotomy was made at the access site with an 11 scalpel. A 0.018" wire was advanced into the SVC and the access needle exchanged for a 6F micropuncture vascular sheath. The 0.018" wire was then removed and a 0.035" wire advanced into the IVC. An appropriate location for the subcutaneous reservoir was selected below the clavicle and an incision was made through the skin and underlying soft tissues. The subcutaneous tissues were then dissected using a combination of blunt and sharp surgical technique and a pocket was formed. A single lumen power injectable portacatheter was then tunneled through the subcutaneous tissues from the pocket to the dermatotomy and the port reservoir placed within the subcutaneous pocket. The venous access site was then serially dilated and a peel away vascular sheath placed over the wire. The wire was removed and the port catheter advanced into position under fluoroscopic guidance. The catheter tip is positioned in the upper right atrium. This was documented with a spot image. The portacatheter was then tested and found to flush and aspirate well. The port was flushed with saline followed by 100 units/mL heparinized saline. The pocket was then closed in two layers using first subdermal inverted interrupted absorbable sutures followed by a running subcuticular suture. The epidermis was then sealed with Dermabond. The dermatotomy at the venous access site was also closed with a single inverted subdermal suture and the epidermis sealed with Dermabond. IMPRESSION: Successful placement of a right IJ approach Power Port with ultrasound and fluoroscopic guidance. The catheter is ready for use. Electronically Signed   By: Jacqulynn Cadet M.D.    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 We will proceed with cycle 2 today on 06/24/2016 with strong premedication with Emend and Aloxi along with steroids. She will return to the cancer center on 06/29/2016 for Lupron injection and Neulasta. I have ordered a PET CT scan to restage the disease on 07/06/2016 and see her back on 07/07/2016 to review test results. She has a bone marrow transplant evaluation schedule at 07/08/2016. We discussed the rationale behind bone marrow transplant. She agreed to proceed  Human immunodeficiency virus (HIV) disease ([place]) Her last  HIV viral load was undetectable. She will continue anti-retroviral treatment  Herpes simplex virus (HSV) infection She has signs of cold sore on her lips.  It is healing. I recommend topical antiviral cream for her.  Submandibular lymphadenopathy She has probable lymphadenopathy in her neck.  It could be related to recent upper respiratory tract infection. I recommend observation for now. As above, I plan to repeat PET CT scan to be done in 2 weeks to assess response to treatment  CODE STATUS Full code  DVT prophylaxis She is on Lovenox  Fertility preservation She will receive Lupron next week.  Pregnancy test yesterday is negative  Discharge planning She will be discharged on 06/26/2016 after chemotherapy is completed.  All questions were answered. The patient knows to call the clinic with any problems, questions or concerns.    Heath Lark, MD 06/24/2016 9:58 AM

## 2016-06-24 NOTE — Progress Notes (Signed)
Etoposide Dosage and Dilution verified with 2nd RN Laural Benes.

## 2016-06-25 ENCOUNTER — Encounter: Payer: Self-pay | Admitting: Hematology and Oncology

## 2016-06-25 MED ORDER — MESNA 100 MG/ML IV SOLN
Freq: Once | INTRAVENOUS | Status: AC
  Administered 2016-06-25: 14:00:00 via INTRAVENOUS
  Filled 2016-06-25: qty 147

## 2016-06-25 MED ORDER — TEMAZEPAM 15 MG PO CAPS: 15.0000 mg | ORAL_CAPSULE | Freq: Every evening | ORAL | Status: DC | PRN

## 2016-06-25 MED ORDER — SODIUM CHLORIDE 0.9 % IV SOLN
512.0000 mg | Freq: Once | INTRAVENOUS | Status: AC
  Administered 2016-06-25: 510 mg via INTRAVENOUS
  Filled 2016-06-25: qty 51

## 2016-06-25 MED ORDER — ZOLPIDEM TARTRATE 5 MG PO TABS: 5.0000 mg | ORAL_TABLET | Freq: Every evening | ORAL | Status: DC | PRN

## 2016-06-25 MED ORDER — SODIUM CHLORIDE 0.9 % IV SOLN
Freq: Once | INTRAVENOUS | Status: AC
  Administered 2016-06-25: 16 mg via INTRAVENOUS
  Filled 2016-06-25: qty 8

## 2016-06-25 MED ORDER — SODIUM CHLORIDE 0.9 % IV SOLN
100.0000 mg/m2 | Freq: Once | INTRAVENOUS | Status: AC
  Administered 2016-06-25: 150 mg via INTRAVENOUS
  Filled 2016-06-25: qty 7.5

## 2016-06-25 MED ORDER — ZOLPIDEM TARTRATE 5 MG PO TABS
5.0000 mg | ORAL_TABLET | Freq: Once | ORAL | Status: AC
  Administered 2016-06-25: 5 mg via ORAL
  Filled 2016-06-25: qty 1

## 2016-06-25 NOTE — Progress Notes (Signed)
[name]   DOB:04-29-1985   [id number]    Subjective: She tolerated chemotherapy well.  The cold sore is healing.  She denies nausea.  Objective:  Vitals:   06/24/16 2054 06/25/16 0424  BP: (!) 98/56 106/64  Pulse: 72 89  Resp: 18 16  Temp: 98.3 F (36.8 C) 97.9 F (36.6 C)     Intake/Output Summary (Last 24 hours) at 06/25/16 0736 Last data filed at 06/25/16 0600  Gross per 24 hour  Intake          3747.83 ml  Output             2700 ml  Net          1047.83 ml    GENERAL:alert, no distress and comfortable SKIN: skin color, texture, turgor are normal, no rashes or significant lesions EYES: normal, Conjunctiva are pink and non-injected, sclera clear LUNGS: clear to auscultation and percussion with normal breathing effort HEART: regular rate & rhythm and no murmurs and no lower extremity edema ABDOMEN:abdomen soft, non-tender and normal bowel sounds Musculoskeletal:no cyanosis of digits and no clubbing  NEURO: alert & oriented x 3 with fluent speech, no focal motor/sensory deficits   Labs:  Lab Results  Component Value Date   WBC 6.6 06/23/2016   HGB 12.6 06/23/2016   HCT 36.6 06/23/2016   MCV 99.7 06/23/2016   PLT 328 06/23/2016   NEUTROABS 4.0 06/23/2016    Lab Results  Component Value Date   NA 143 06/23/2016   K 3.9 06/23/2016   CL 109 06/05/2016   CO2 28 06/23/2016   Assessment & Plan:   Hodgkin lymphoma, 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 She started on cycle 2 on 06/24/2016 with strong premedication with Emend and Aloxi along with steroids. She will return to the cancer center on 06/29/2016 for Lupron injection and Neulasta. I have ordered a PET CT scan to restage the disease on 07/06/2016 and see her back on 07/07/2016 to review test results. She has a bone marrow transplant evaluation schedule at 07/08/2016. We discussed the rationale behind bone marrow transplant.  Human  immunodeficiency virus (HIV) disease ([place]) Her last HIV viral load was undetectable. She will continue anti-retroviral treatment  Herpes simplex virus (HSV) infection She has signs of cold sore on her lips. It is healing. I recommend topical antiviral cream for her.  Submandibular lymphadenopathy She has probable lymphadenopathy in her neck. It could be related to recent upper respiratory tract infection. I recommend observation for now. As above, I plan to repeat PET CT scan to be done in 2 weeks to assess response to treatment  CODE STATUS Full code  DVT prophylaxis She is on Lovenox  Fertility preservation She will receive Lupron next week.  Pregnancy test yesterday is negative  Discharge planning She will be discharged on 06/26/2016 after chemotherapy is completed.  Allison Lark, MD 06/25/2016  7:36 AM

## 2016-06-25 NOTE — Progress Notes (Signed)
Doses and dilutions of Carboplatin, Etoposide and Ifos/[place] verified with Reyne Dumas, RN.

## 2016-06-25 NOTE — Progress Notes (Signed)
Called [employer] to initiate auth for Acyclovir ointment and was informed if pt uses the brand name Zovirax it will be covered.  Pt needs to have tried and failed this first before initiating request for the generic.  I notified Hassan Rowan (nurse) of this.  She will let Dr. Alvy Bimler know.

## 2016-06-26 LAB — CBC WITH DIFFERENTIAL/PLATELET
BASOS ABS: 0 10*3/uL (ref 0.0–0.1)
BASOS PCT: 0 %
EOS ABS: 0 10*3/uL (ref 0.0–0.7)
Eosinophils Relative: 0 %
HEMATOCRIT: 31.9 % — AB (ref 36.0–46.0)
Hemoglobin: 11.1 g/dL — ABNORMAL LOW (ref 12.0–15.0)
Lymphocytes Relative: 4 %
Lymphs Abs: 0.7 10*3/uL (ref 0.7–4.0)
MCH: 33.6 pg (ref 26.0–34.0)
MCHC: 34.8 g/dL (ref 30.0–36.0)
MCV: 96.7 fL (ref 78.0–100.0)
MONO ABS: 0.7 10*3/uL (ref 0.1–1.0)
Monocytes Relative: 4 %
NEUTROS ABS: 16.9 10*3/uL — AB (ref 1.7–7.7)
NEUTROS PCT: 92 %
Platelets: 366 10*3/uL (ref 150–400)
RBC: 3.3 MIL/uL — ABNORMAL LOW (ref 3.87–5.11)
RDW: 13.6 % (ref 11.5–15.5)
WBC: 18.3 10*3/uL — ABNORMAL HIGH (ref 4.0–10.5)

## 2016-06-26 LAB — COMPREHENSIVE METABOLIC PANEL
ALBUMIN: 3.7 g/dL (ref 3.5–5.0)
ALT: 23 U/L (ref 14–54)
ANION GAP: 5 (ref 5–15)
AST: 18 U/L (ref 15–41)
Alkaline Phosphatase: 67 U/L (ref 38–126)
BILIRUBIN TOTAL: 0.5 mg/dL (ref 0.3–1.2)
BUN: 13 mg/dL (ref 6–20)
CHLORIDE: 110 mmol/L (ref 101–111)
CO2: 25 mmol/L (ref 22–32)
Calcium: 9.1 mg/dL (ref 8.9–10.3)
Creatinine, Ser: 0.6 mg/dL (ref 0.44–1.00)
GFR calc Af Amer: >60 mL/min (ref >60)
GFR calc non Af Amer: >60 mL/min (ref >60)
GLUCOSE: 111 mg/dL — AB (ref 65–99)
Potassium: 3.5 mmol/L (ref 3.5–5.1)
SODIUM: 140 mmol/L (ref 135–145)
Total Protein: 6.5 g/dL (ref 6.5–8.1)

## 2016-06-26 MED ORDER — DEXAMETHASONE 4 MG PO TABS: ORAL_TABLET | ORAL | 0 refills | Status: DC

## 2016-06-26 MED ORDER — SODIUM CHLORIDE 0.9 % IV SOLN
100.0000 mg/m2 | Freq: Once | INTRAVENOUS | Status: AC
  Administered 2016-06-26: 150 mg via INTRAVENOUS
  Filled 2016-06-26: qty 7.5

## 2016-06-26 MED ORDER — SODIUM CHLORIDE 0.9 % IV SOLN
Freq: Once | INTRAVENOUS | Status: AC
  Administered 2016-06-26: 16 mg via INTRAVENOUS
  Filled 2016-06-26: qty 8

## 2016-06-26 MED ORDER — ONDANSETRON HCL 8 MG PO TABS: 8.0000 mg | ORAL_TABLET | Freq: Three times a day (TID) | ORAL | 3 refills | Status: DC | PRN

## 2016-06-26 MED ORDER — TRAMADOL HCL 50 MG PO TABS: 50.0000 mg | ORAL_TABLET | Freq: Four times a day (QID) | ORAL | 0 refills | Status: DC | PRN

## 2016-06-26 NOTE — Progress Notes (Signed)
Etoposide dose and dilution verified with Reyne Dumas, RN.

## 2016-06-26 NOTE — Discharge Summary (Signed)
Physician Discharge Summary  Patient ID: [name] MRN: [id number] DOB/AGE: 1985/11/16 31 y.o.  Admit date: 06/24/2016 Discharge date: 06/26/2016  Primary Care Physician:  Allison Gloss, MD   Discharge Diagnoses:    Present on Admission: . Hodgkin lymphoma, nodular sclerosis ([name]) . Herpes simplex virus (HSV) infection . Human immunodeficiency virus (HIV) disease ([name]) . Hodgkin lymphoma [facility])   Discharge Medications:  Allergies as of 06/26/2016      Reactions   Temazepam Other (See Comments)   disorientation      Medication List    STOP taking these medications   amoxicillin-clavulanate 875-125 MG tablet Commonly known as:  AUGMENTIN   benzonatate 100 MG capsule Commonly known as:  TESSALON     TAKE these medications   acyclovir ointment 5 % Commonly known as:  ZOVIRAX Apply 1 application topically every 3 (three) hours.   albuterol 108 (90 Base) MCG/ACT inhaler Commonly known as:  PROVENTIL HFA;VENTOLIN HFA Inhale 2 puffs into the lungs every 4 (four) hours as needed for wheezing or shortness of breath.   allopurinol 300 MG tablet Commonly known as:  ZYLOPRIM Take 1 tablet (300 mg total) by mouth daily.   atazanavir 300 MG capsule Commonly known as:  REYATAZ TAKE ONE CAPSULE BY MOUTH EVERY MORNING WITH BREAKFAST. TAKE WITH NORVIR   cholecalciferol 1000 units tablet Commonly known as:  VITAMIN D Take 1 tablet (1,000 Units total) by mouth daily.   CLEAR EYES MAX REDNESS RELIEF OP Place 1 drop into both eyes 2 (two) times daily as needed (for redness).   dexamethasone 4 MG tablet Commonly known as:  DECADRON Take 1 at breakfast and 2nd dose at lunch with food for 2 days after chemo   ibuprofen 200 MG tablet Commonly known as:  ADVIL,MOTRIN Take 400 mg by mouth every 6 (six) hours as needed for headache, mild pain or moderate pain.   NORVIR 100 MG Tabs tablet Generic drug:  ritonavir TAKE 1 TABLET BY MOUTH EVERY MORNING WITH BREAKFAST.  TAKE WITH REYATAZ   ondansetron 8 MG tablet Commonly known as:  ZOFRAN Take 1 tablet (8 mg total) by mouth every 8 (eight) hours as needed for nausea or vomiting. After trying compazine What changed:  Another medication with the same name was added. Make sure you understand how and when to take each.   ondansetron 8 MG tablet Commonly known as:  ZOFRAN Take 1 tablet (8 mg total) by mouth every 8 (eight) hours as needed for nausea. What changed:  You were already taking a medication with the same name, and this prescription was added. Make sure you understand how and when to take each.   polyethylene glycol packet Commonly known as:  MIRALAX Take 17 g by mouth daily as needed for mild constipation.   prochlorperazine 10 MG tablet Commonly known as:  COMPAZINE Take 1 tablet (10 mg total) by mouth every 6 (six) hours as needed for nausea or vomiting.   traMADol 50 MG tablet Commonly known as:  ULTRAM Take 1 tablet (50 mg total) by mouth every 4 (four) hours as needed. for pain What changed:  Another medication with the same name was added. Make sure you understand how and when to take each.   traMADol 50 MG tablet Commonly known as:  ULTRAM Take 1 tablet (50 mg total) by mouth every 6 (six) hours as needed for moderate pain. What changed:  Another medication with the same name was added. Make sure you understand how and  when to take each.   traMADol 50 MG tablet Commonly known as:  ULTRAM Take 1 tablet (50 mg total) by mouth every 6 (six) hours as needed for moderate pain. What changed:  You were already taking a medication with the same name, and this prescription was added. Make sure you understand how and when to take each.   TRUVADA 200-300 MG tablet Generic drug:  emtricitabine-tenofovir TAKE 1 TABLET BY MOUTH EVERY DAY       Disposition and Follow-up:   Significant Diagnostic Studies:  Dg Neck Soft Tissue  Result Date: 06/17/2016 CLINICAL DATA:  Left-sided neck  swelling under the jaw EXAM: NECK SOFT TISSUES - 1+ VIEW COMPARISON:  PET-CT 06/02/2016 FINDINGS: Prevertebral soft tissue thickness appears normal. There is straightening of the cervical vertebral bodies. Epiglottis is normal. Subglottic trachea is patent. Soft tissue fullness in the submandibular region. No gas collection. IMPRESSION: 1. Soft tissue fullness in the submandibular region. CT neck with contrast may be obtained for further evaluation as indicated. 2. Otherwise negative neck soft tissues. Electronically Signed   By: Donavan Foil M.D.   On: 06/17/2016 23:59   Dg Chest 2 View  Result Date: 06/17/2016 CLINICAL DATA:  Acute onset of productive cough.  Initial encounter. EXAM: CHEST  2 VIEW COMPARISON:  PET/CT performed 06/02/2016 FINDINGS: The lungs are well-aerated and clear. There is no evidence of focal opacification, pleural effusion or pneumothorax. The heart is normal in size; the mediastinal contour is within normal limits. The right-sided chest port is noted ending about the mid to distal SVC. No acute osseous abnormalities are seen. IMPRESSION: No acute cardiopulmonary process seen. Electronically Signed   By: Garald Balding M.D.   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 EXAM: NUCLEAR MEDICINE PET SKULL BASE TO THIGH TECHNIQUE: 8.4 mCi F-18 FDG was injected intravenously. Full-ring PET imaging was performed from the skull base to thigh after the radiotracer. CT data was obtained and used for attenuation correction and anatomic localization. FASTING BLOOD GLUCOSE:  Value: 76 mg/dl COMPARISON:  PET-CT scan 02/14/2016 FINDINGS: NECK No lymphadenopathy in the neck. CHEST Hypermetabolic mediastinal nodes are decreased in metabolic activity. Nodes remain intensely metabolic however. For example node along the LEFT mainstem bronchus with SUV max equal 5.3 decreased  from 11.9. LEFT infrahilar nodule with intense metabolic activity (SUV max equal 9.0 decreased from 12.4). No hypermetabolic axillary nodes. ABDOMEN/PELVIS Periaortic retroperitoneal adenopathy is similar to comparison exam. Example lymph node LEFT of the aorta SUV max equal 7.6 compares to 9.5. Group of lymph nodes anterior to the IVC SUV max equal 11.7 compared with SUV max 8.5. LEFT common iliac lymph node with SUV max 11.1 compares with SUV max 8.1. Two tree in metabolic activity of obturator lymph nodes. For example LEFT upper lymph node with SUV max 6.4 decreased from 15.3. Nodes are difficult difficult define on noncontrast CT. Little intra-abdominal fat appear No abnormal metabolic activity liver or spleen. SKELETON There several new foci of intense metabolic activity within skeleton. Lesions at T4 and T6 with SUV max equal 6.6 and 7.6 respectively. Lesion the RIGHT inferior pubic ramus with SUV max equal 6.2 is new from prior. IMPRESSION: 1. Mixed response to chemotherapy with some lymph nodes decreased in metabolic activity and some lymph nodes increase metabolic activity. Lymph node stations including mediastinum, periaortic retroperitoneum, and obturator node stations. Activity is remains relatively  intense [place] 4 2. LEFT infrahilar nodule / lymph node with intense metabolic activity decreased from prior. ( [place] 4 ). 3. 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 [place] 5 Electronically Signed   By: Suzy Bouchard M.D.   On: 06/02/2016 10:08    Discharge Laboratory Values: Lab Results  Component Value Date   WBC 18.3 (H) 06/26/2016   HGB 11.1 (L) 06/26/2016   HCT 31.9 (L) 06/26/2016   MCV 96.7 06/26/2016   PLT 366 06/26/2016   Lab Results  Component Value Date   NA 140 06/26/2016   K 3.5 06/26/2016   CL 110 06/26/2016   CO2 25 06/26/2016    Brief H and P: For complete details please refer to admission H and P, but in brief, she was admitted  for cycle 2 of ICE. 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 She will return to the cancer center on 06/29/2016 for Lupron injection and Neulasta. I have ordered a PET CT scan to restage the disease on 07/06/2016 and see her back on 07/07/2016 to review test results. She has a bone marrow transplant evaluation schedule at 07/08/2016. We discussed the rationale behind bone marrow transplant.  Human immunodeficiency virus (HIV) disease ([place]) Her last HIV viral load was undetectable. She will continue anti-retroviral treatment  Herpes simplex virus (HSV) infection, resolved She has signs of cold sore on her lips. It is healing. I recommend topical antiviral cream for her.  Submandibular lymphadenopathy, resolved It could be related to recent upper respiratory tract infection. I recommend observation for now. As above, I plan to repeat PET CT scan to be done in 2 weeks to assess response to treatment  CODE STATUS Full code  DVT prophylaxis She is on Lovenox  Fertility preservation She will receive Lupron next week. Pregnancy test yesterday is negative  Discharge planning She will be discharged on 06/26/2016 after chemotherapy is completed.  Physical Exam at Discharge: BP 108/76 (BP Location: Left Arm)   Pulse (!) 52   Temp 98.1 F (36.7 C) (Oral)   Resp 16   Ht 5\' 4"  (1.626 m)   Wt 100 lb (45.4 kg)   LMP 01/23/2016   SpO2 100%   BMI 17.16 kg/m  GENERAL:alert, no distress and comfortable SKIN: skin color, texture, turgor are normal, no rashes or significant lesions EYES: normal, Conjunctiva are pink and non-injected, sclera clear OROPHARYNX:no exudate, no erythema and lips, buccal mucosa, and tongue normal  NECK: supple, thyroid normal size, non-tender, without nodularity LYMPH:  no palpable lymphadenopathy in the cervical,  axillary or inguinal LUNGS: clear to auscultation and percussion with normal breathing effort HEART: regular rate & rhythm and no murmurs and no lower extremity edema ABDOMEN:abdomen soft, non-tender and normal bowel sounds Musculoskeletal:no cyanosis of digits and no clubbing  NEURO: alert & oriented x 3 with fluent speech, no focal motor/sensory deficits  Hospital Course:  Active Problems:   Hodgkin lymphoma, nodular sclerosis ([facility])   Human immunodeficiency virus (HIV) disease ([place])   Herpes simplex virus (HSV) infection   Hodgkin lymphoma ([place])   Diet:  Regular  Activity:  As tolerated  Condition at Discharge:   stable  Signed: Dr. Heath Lark [phone]  06/26/2016, 7:34 AM

## 2016-06-29 ENCOUNTER — Ambulatory Visit (HOSPITAL_BASED_OUTPATIENT_CLINIC_OR_DEPARTMENT_OTHER): Payer: Medicaid Other

## 2016-06-29 VITALS — BP 115/68 | HR 62 | Temp 97.9°F | Resp 18

## 2016-06-29 DIAGNOSIS — C8118 Nodular sclerosis classical Hodgkin lymphoma, lymph nodes of multiple sites: Secondary | ICD-10-CM | POA: Diagnosis not present

## 2016-06-29 DIAGNOSIS — Z5189 Encounter for other specified aftercare: Secondary | ICD-10-CM | POA: Diagnosis not present

## 2016-06-29 MED ORDER — PEGFILGRASTIM INJECTION 6 MG/0.6ML ~~LOC~~
6.0000 mg | PREFILLED_SYRINGE | Freq: Once | SUBCUTANEOUS | Status: AC
  Administered 2016-06-29: 6 mg via SUBCUTANEOUS
  Filled 2016-06-29: qty 0.6

## 2016-06-29 NOTE — Patient Instructions (Signed)
Pegfilgrastim injection What is this medicine? PEGFILGRASTIM (PEG fil gra stim) is a long-acting granulocyte colony-stimulating factor that stimulates the growth of neutrophils, a type of white blood cell important in the body's fight against infection. It is used to reduce the incidence of fever and infection in patients with certain types of cancer who are receiving chemotherapy that affects the bone marrow, and to increase survival after being exposed to high doses of radiation. This medicine may be used for other purposes; ask your health care provider or pharmacist if you have questions. COMMON BRAND NAME(S): Neulasta What should I tell my health care provider before I take this medicine? They need to know if you have any of these conditions: -kidney disease -latex allergy -ongoing radiation therapy -sickle cell disease -skin reactions to acrylic adhesives (On-Body Injector only) -an unusual or allergic reaction to pegfilgrastim, filgrastim, other medicines, foods, dyes, or preservatives -pregnant or trying to get pregnant -breast-feeding How should I use this medicine? This medicine is for injection under the skin. If you get this medicine at home, you will be taught how to prepare and give the pre-filled syringe or how to use the On-body Injector. Refer to the patient Instructions for Use for detailed instructions. Use exactly as directed. Take your medicine at regular intervals. Do not take your medicine more often than directed. It is important that you put your used needles and syringes in a special sharps container. Do not put them in a trash can. If you do not have a sharps container, call your pharmacist or healthcare provider to get one. Talk to your pediatrician regarding the use of this medicine in children. While this drug may be prescribed for selected conditions, precautions do apply. Overdosage: If you think you have taken too much of this medicine contact a poison control  center or emergency room at once. NOTE: This medicine is only for you. Do not share this medicine with others. What if I miss a dose? It is important not to miss your dose. Call your doctor or health care professional if you miss your dose. If you miss a dose due to an On-body Injector failure or leakage, a new dose should be administered as soon as possible using a single prefilled syringe for manual use. What may interact with this medicine? Interactions have not been studied. Give your health care provider a list of all the medicines, herbs, non-prescription drugs, or dietary supplements you use. Also tell them if you smoke, drink alcohol, or use illegal drugs. Some items may interact with your medicine. This list may not describe all possible interactions. Give your health care provider a list of all the medicines, herbs, non-prescription drugs, or dietary supplements you use. Also tell them if you smoke, drink alcohol, or use illegal drugs. Some items may interact with your medicine. What should I watch for while using this medicine? You may need blood work done while you are taking this medicine. If you are going to need a MRI, CT scan, or other procedure, tell your doctor that you are using this medicine (On-Body Injector only). What side effects may I notice from receiving this medicine? Side effects that you should report to your doctor or health care professional as soon as possible: -allergic reactions like skin rash, itching or hives, swelling of the face, lips, or tongue -dizziness -fever -pain, redness, or irritation at site where injected -pinpoint red spots on the skin -red or dark-brown urine -shortness of breath or breathing problems -stomach or   side pain, or pain at the shoulder -swelling -tiredness -trouble passing urine or change in the amount of urine Side effects that usually do not require medical attention (report to your doctor or health care professional if they  continue or are bothersome): -bone pain -muscle pain This list may not describe all possible side effects. Call your doctor for medical advice about side effects. You may report side effects to FDA at 1-800-FDA-1088. Where should I keep my medicine? Keep out of the reach of children. Store pre-filled syringes in a refrigerator between 2 and 8 degrees C (36 and 46 degrees F). Do not freeze. Keep in carton to protect from light. Throw away this medicine if it is left out of the refrigerator for more than 48 hours. Throw away any unused medicine after the expiration date. NOTE: This sheet is a summary. It may not cover all possible information. If you have questions about this medicine, talk to your doctor, pharmacist, or health care provider.  2017 Elsevier/Gold Standard (2014-05-03 14:30:14)  

## 2016-07-02 ENCOUNTER — Emergency Department (HOSPITAL_COMMUNITY): Payer: Medicaid Other

## 2016-07-02 ENCOUNTER — Emergency Department (HOSPITAL_COMMUNITY)
Admission: EM | Admit: 2016-07-02 | Discharge: 2016-07-02 | Disposition: A | Discharge status: Home / Self Care | Payer: Medicaid Other | Attending: Emergency Medicine | Admitting: Emergency Medicine

## 2016-07-02 ENCOUNTER — Encounter (HOSPITAL_COMMUNITY): Payer: Self-pay

## 2016-07-02 ENCOUNTER — Other Ambulatory Visit: Payer: Medicaid Other

## 2016-07-02 ENCOUNTER — Telehealth: Payer: Self-pay

## 2016-07-02 ENCOUNTER — Telehealth: Payer: Self-pay | Admitting: *Deleted

## 2016-07-02 ENCOUNTER — Other Ambulatory Visit (HOSPITAL_COMMUNITY)
Admission: RE | Admit: 2016-07-02 | Discharge: 2016-07-02 | Disposition: A | Discharge status: Home / Self Care | Payer: Medicaid Other | Source: Ambulatory Visit | Attending: Internal Medicine | Admitting: Internal Medicine

## 2016-07-02 DIAGNOSIS — R109 Unspecified abdominal pain: Secondary | ICD-10-CM

## 2016-07-02 DIAGNOSIS — Z79899 Other long term (current) drug therapy: Secondary | ICD-10-CM | POA: Diagnosis not present

## 2016-07-02 DIAGNOSIS — R1084 Generalized abdominal pain: Secondary | ICD-10-CM | POA: Diagnosis present

## 2016-07-02 DIAGNOSIS — F17210 Nicotine dependence, cigarettes, uncomplicated: Secondary | ICD-10-CM | POA: Insufficient documentation

## 2016-07-02 DIAGNOSIS — K6289 Other specified diseases of anus and rectum: Secondary | ICD-10-CM | POA: Diagnosis not present

## 2016-07-02 DIAGNOSIS — Z113 Encounter for screening for infections with a predominantly sexual mode of transmission: Secondary | ICD-10-CM

## 2016-07-02 DIAGNOSIS — B20 Human immunodeficiency virus [HIV] disease: Secondary | ICD-10-CM

## 2016-07-02 DIAGNOSIS — R197 Diarrhea, unspecified: Secondary | ICD-10-CM | POA: Insufficient documentation

## 2016-07-02 DIAGNOSIS — J029 Acute pharyngitis, unspecified: Secondary | ICD-10-CM | POA: Insufficient documentation

## 2016-07-02 DIAGNOSIS — Z8571 Personal history of Hodgkin lymphoma: Secondary | ICD-10-CM | POA: Diagnosis not present

## 2016-07-02 LAB — CBC
HCT: 35.9 % — ABNORMAL LOW (ref 36.0–46.0)
HEMOGLOBIN: 12.7 g/dL (ref 12.0–15.0)
MCH: 33.4 pg (ref 26.0–34.0)
MCHC: 35.4 g/dL (ref 30.0–36.0)
MCV: 94.5 fL (ref 78.0–100.0)
Platelets: 116 10*3/uL — ABNORMAL LOW (ref 150–400)
RBC: 3.8 MIL/uL — AB (ref 3.87–5.11)
RDW: 12.9 % (ref 11.5–15.5)
WBC: 5.2 10*3/uL (ref 4.0–10.5)

## 2016-07-02 LAB — URINALYSIS, ROUTINE W REFLEX MICROSCOPIC
BILIRUBIN URINE: NEGATIVE
Bacteria, UA: NONE SEEN
GLUCOSE, UA: NEGATIVE mg/dL
Hgb urine dipstick: NEGATIVE
KETONES UR: NEGATIVE mg/dL
LEUKOCYTES UA: NEGATIVE
Nitrite: NEGATIVE
PH: 8 (ref 5.0–8.0)
PROTEIN: 30 mg/dL — AB
Specific Gravity, Urine: 1.02 (ref 1.005–1.030)

## 2016-07-02 LAB — COMPREHENSIVE METABOLIC PANEL
ALT: 35 U/L (ref 14–54)
ANION GAP: 8 (ref 5–15)
AST: 16 U/L (ref 15–41)
Albumin: 4.4 g/dL (ref 3.5–5.0)
Alkaline Phosphatase: 78 U/L (ref 38–126)
BUN: 20 mg/dL (ref 6–20)
CHLORIDE: 101 mmol/L (ref 101–111)
CO2: 28 mmol/L (ref 22–32)
Calcium: 9.5 mg/dL (ref 8.9–10.3)
Creatinine, Ser: 0.51 mg/dL (ref 0.44–1.00)
Glucose, Bld: 106 mg/dL — ABNORMAL HIGH (ref 65–99)
Potassium: 3.6 mmol/L (ref 3.5–5.1)
SODIUM: 137 mmol/L (ref 135–145)
Total Bilirubin: 2.3 mg/dL — ABNORMAL HIGH (ref 0.3–1.2)
Total Protein: 7.8 g/dL (ref 6.5–8.1)

## 2016-07-02 LAB — LIPASE, BLOOD: LIPASE: 17 U/L (ref 11–51)

## 2016-07-02 LAB — RAPID STREP SCREEN (MED CTR MEBANE ONLY): STREPTOCOCCUS, GROUP A SCREEN (DIRECT): NEGATIVE

## 2016-07-02 LAB — I-STAT BETA HCG BLOOD, ED (MC, WL, AP ONLY)
I-stat hCG, quantitative: <5 m[IU]/mL (ref <5)
I-stat hCG, quantitative: <5 m[IU]/mL (ref <5)

## 2016-07-02 LAB — BILIRUBIN, DIRECT: BILIRUBIN DIRECT: 0.3 mg/dL (ref 0.1–0.5)

## 2016-07-02 MED ORDER — IOPAMIDOL (ISOVUE-300) INJECTION 61%: 30.0000 mL | Freq: Once | INTRAVENOUS | Status: DC | PRN

## 2016-07-02 MED ORDER — IOPAMIDOL (ISOVUE-300) INJECTION 61%: 75.0000 mL | Freq: Once | INTRAVENOUS | Status: DC | PRN

## 2016-07-02 MED ORDER — SODIUM CHLORIDE 0.9 % IV BOLUS (SEPSIS)
1000.0000 mL | Freq: Once | INTRAVENOUS | Status: AC
  Administered 2016-07-02: 1000 mL via INTRAVENOUS

## 2016-07-02 MED ORDER — FLUCONAZOLE 100 MG PO TABS: 100.0000 mg | ORAL_TABLET | Freq: Every day | ORAL | 0 refills | Status: DC

## 2016-07-02 MED ORDER — MORPHINE SULFATE (PF) 4 MG/ML IV SOLN
4.0000 mg | Freq: Once | INTRAVENOUS | Status: AC
  Administered 2016-07-02: 4 mg via INTRAVENOUS
  Filled 2016-07-02: qty 1

## 2016-07-02 MED ORDER — IOPAMIDOL (ISOVUE-300) INJECTION 61%
INTRAVENOUS | Status: AC
  Filled 2016-07-02: qty 30

## 2016-07-02 MED ORDER — IOPAMIDOL (ISOVUE-300) INJECTION 61%
INTRAVENOUS | Status: AC
  Filled 2016-07-02: qty 75

## 2016-07-02 NOTE — Telephone Encounter (Signed)
Please call in fluconazole 100 mg daily PO x 7 days, no refills

## 2016-07-02 NOTE — Telephone Encounter (Signed)
Notified of new prescription for throat
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
## 2016-07-02 NOTE — ED Notes (Signed)
Patient was alert, oriented and stable upon discharge. RN went over AVS and patient had no further questions.  

## 2016-07-02 NOTE — ED Notes (Signed)
Pt drinking contrast for CT. Asked not to eat or drink until results of CT scan are back.

## 2016-07-02 NOTE — ED Notes (Signed)
ED Provider at bedside. 

## 2016-07-02 NOTE — Telephone Encounter (Signed)
1227 pt lvm asking for call back b/c her stomach is hurting real bad. At 1300 when retrieved message the pt was already admitted to ER.

## 2016-07-02 NOTE — ED Triage Notes (Addendum)
Patient reports that she had chemo 3 days in a row a week ago and is now having "white patches" on the back of the throat and generalized abdominal pain with diarrhea that started last night.

## 2016-07-02 NOTE — Telephone Encounter (Signed)
Patient here for routine labs, asked to speak with nurse regarding sore throat and vaginal irritation. 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 She states her throat burns when she drinks/eats anything and has noticed vaginal discharge and discomfort since Friday. Her throat is red/irritated with small clear blisters and white patches on nurse exam. We do not have a physician here. Patient will contact her oncologist's office to see if they can see her/call in treatment per protocol. Landis Gandy, RN

## 2016-07-02 NOTE — ED Notes (Signed)
LABS WILL BE COLLECTED FROM PT PORT

## 2016-07-02 NOTE — Telephone Encounter (Signed)
[name] states she is having trouble swallowing, has yeast in back of throat since Wednesday.

## 2016-07-02 NOTE — ED Provider Notes (Signed)
Received care from Dr. Leonette Monarch. Please see his prior note for history, physical and care.  Briefly this is a 31yo female who presents with abdominal pain, sore throat, diarrhea.  CT for abdominal pain pending.  CT completed showing likely proctitis.  Possible viral or other inflammatory and doubt bacterial process.  Patient with anal masses inspected, not consistent with condyloma, less likely hemorrhoid, more concerning for type of mass.  Recommend follow up with her ID/oncology/primary physicians.   Gareth Morgan, MD 07/10/16 [phone]

## 2016-07-03 LAB — URINE CYTOLOGY ANCILLARY ONLY
CHLAMYDIA, DNA PROBE: NEGATIVE
Neisseria Gonorrhea: NEGATIVE

## 2016-07-04 LAB — HIV-1 RNA QUANT-NO REFLEX-BLD
HIV 1 RNA QUANT: NOT DETECTED {copies}/mL
HIV-1 RNA QUANT, LOG: NOT DETECTED {Log_copies}/mL

## 2016-07-05 LAB — CULTURE, GROUP A STREP (THRC)

## 2016-07-06 LAB — T-HELPER CELL (CD4) - (RCID CLINIC ONLY)
CD4 T CELL HELPER: 37 % (ref 33–55)
CD4 T Cell Abs: 142 /uL — ABNORMAL LOW (ref 400–2700)

## 2016-07-07 ENCOUNTER — Ambulatory Visit: Payer: Medicaid Other

## 2016-07-07 ENCOUNTER — Ambulatory Visit (HOSPITAL_COMMUNITY)
Admission: RE | Admit: 2016-07-07 | Discharge: 2016-07-07 | Disposition: A | Discharge status: Home / Self Care | Payer: Medicaid Other | Source: Ambulatory Visit | Attending: Hematology and Oncology | Admitting: Hematology and Oncology

## 2016-07-07 ENCOUNTER — Other Ambulatory Visit (HOSPITAL_BASED_OUTPATIENT_CLINIC_OR_DEPARTMENT_OTHER): Payer: Medicaid Other

## 2016-07-07 ENCOUNTER — Ambulatory Visit (HOSPITAL_BASED_OUTPATIENT_CLINIC_OR_DEPARTMENT_OTHER): Payer: Medicaid Other

## 2016-07-07 ENCOUNTER — Encounter: Payer: Self-pay | Admitting: Hematology and Oncology

## 2016-07-07 ENCOUNTER — Ambulatory Visit (HOSPITAL_BASED_OUTPATIENT_CLINIC_OR_DEPARTMENT_OTHER): Payer: Medicaid Other | Admitting: Hematology and Oncology

## 2016-07-07 DIAGNOSIS — D696 Thrombocytopenia, unspecified: Secondary | ICD-10-CM | POA: Diagnosis not present

## 2016-07-07 DIAGNOSIS — Z5111 Encounter for antineoplastic chemotherapy: Secondary | ICD-10-CM | POA: Diagnosis present

## 2016-07-07 DIAGNOSIS — C8118 Nodular sclerosis classical Hodgkin lymphoma, lymph nodes of multiple sites: Secondary | ICD-10-CM | POA: Diagnosis not present

## 2016-07-07 DIAGNOSIS — C8198 Hodgkin lymphoma, unspecified, lymph nodes of multiple sites: Secondary | ICD-10-CM

## 2016-07-07 DIAGNOSIS — M791 Myalgia: Secondary | ICD-10-CM | POA: Diagnosis not present

## 2016-07-07 DIAGNOSIS — Z95828 Presence of other vascular implants and grafts: Secondary | ICD-10-CM

## 2016-07-07 DIAGNOSIS — G8929 Other chronic pain: Secondary | ICD-10-CM

## 2016-07-07 DIAGNOSIS — B20 Human immunodeficiency virus [HIV] disease: Secondary | ICD-10-CM

## 2016-07-07 DIAGNOSIS — M25531 Pain in right wrist: Secondary | ICD-10-CM

## 2016-07-07 LAB — COMPREHENSIVE METABOLIC PANEL
ALBUMIN: 3.8 g/dL (ref 3.5–5.0)
ALK PHOS: 105 U/L (ref 40–150)
ALT: 34 U/L (ref 0–55)
ANION GAP: 8 meq/L (ref 3–11)
AST: 15 U/L (ref 5–34)
BILIRUBIN TOTAL: 0.35 mg/dL (ref 0.20–1.20)
BUN: 12.5 mg/dL (ref 7.0–26.0)
CALCIUM: 9 mg/dL (ref 8.4–10.4)
CO2: 24 mEq/L (ref 22–29)
Chloride: 106 mEq/L (ref 98–109)
Creatinine: 0.7 mg/dL (ref 0.6–1.1)
GLUCOSE: 135 mg/dL (ref 70–140)
Potassium: 3.3 mEq/L — ABNORMAL LOW (ref 3.5–5.1)
Sodium: 138 mEq/L (ref 136–145)
Total Protein: 6.6 g/dL (ref 6.4–8.3)

## 2016-07-07 LAB — CBC WITH DIFFERENTIAL/PLATELET
BASO%: 0.2 % (ref 0.0–2.0)
Basophils Absolute: 0 10*3/uL (ref 0.0–0.1)
EOS%: 0.1 % (ref 0.0–7.0)
Eosinophils Absolute: 0 10*3/uL (ref 0.0–0.5)
HEMATOCRIT: 35.7 % (ref 34.8–46.6)
HGB: 12.2 g/dL (ref 11.6–15.9)
LYMPH#: 1.3 10*3/uL (ref 0.9–3.3)
LYMPH%: 10.7 % — ABNORMAL LOW (ref 14.0–49.7)
MCH: 34 pg (ref 25.1–34.0)
MCHC: 34.2 g/dL (ref 31.5–36.0)
MCV: 99.4 fL (ref 79.5–101.0)
MONO#: 0.8 10*3/uL (ref 0.1–0.9)
MONO%: 7 % (ref 0.0–14.0)
NEUT#: 9.6 10*3/uL — ABNORMAL HIGH (ref 1.5–6.5)
NEUT%: 82 % — AB (ref 38.4–76.8)
Platelets: 43 10*3/uL — ABNORMAL LOW (ref 145–400)
RBC: 3.59 10*6/uL — ABNORMAL LOW (ref 3.70–5.45)
RDW: 13.4 % (ref 11.2–14.5)
WBC: 11.7 10*3/uL — AB (ref 3.9–10.3)

## 2016-07-07 LAB — GLUCOSE, CAPILLARY: Glucose-Capillary: 97 mg/dL (ref 65–99)

## 2016-07-07 MED ORDER — HEPARIN SOD (PORK) LOCK FLUSH 100 UNIT/ML IV SOLN
500.0000 [IU] | Freq: Once | INTRAVENOUS | Status: AC | PRN
  Administered 2016-07-07: 500 [IU] via INTRAVENOUS
  Filled 2016-07-07: qty 5

## 2016-07-07 MED ORDER — LEUPROLIDE ACETATE 7.5 MG IM KIT
7.5000 mg | PACK | Freq: Once | INTRAMUSCULAR | Status: AC
  Administered 2016-07-07: 7.5 mg via INTRAMUSCULAR
  Filled 2016-07-07: qty 7.5

## 2016-07-07 MED ORDER — TRAMADOL HCL 50 MG PO TABS: 50.0000 mg | ORAL_TABLET | Freq: Four times a day (QID) | ORAL | 0 refills | Status: DC | PRN

## 2016-07-07 MED ORDER — SODIUM CHLORIDE 0.9% FLUSH
10.0000 mL | INTRAVENOUS | Status: DC | PRN
  Administered 2016-07-07: 10 mL via INTRAVENOUS
  Filled 2016-07-07: qty 10

## 2016-07-07 MED ORDER — FLUDEOXYGLUCOSE F - 18 (FDG) INJECTION
6.6800 | Freq: Once | INTRAVENOUS | Status: AC | PRN
  Administered 2016-07-07: 6.68 via INTRAVENOUS

## 2016-07-07 NOTE — Progress Notes (Signed)
[facility] OFFICE PROGRESS NOTE  Patient Care Team: Robert W Comer, MD as PCP - General (Internal Medicine) Robert W Comer, MD as PCP - Infectious Diseases (Infectious Diseases) James G Arnold, MD as Consulting Physician (Obstetrics and Gynecology) Michael B Wert, MD as Consulting Physician (Pulmonary Disease) Steven C Hendrickson, MD as Consulting Physician (Cardiothoracic Surgery)  SUMMARY OF ONCOLOGIC HISTORY:   Hodgkin lymphoma, nodular sclerosis (HCC)   05/06/2014 Imaging    CT scan of the abdomen show diffuse mesenteric lymphadenopathy.      05/07/2014 Imaging    CT scan of the chest show right thoracic inlet lymphadenopathy      06/07/2014 Procedure    She underwent ultrasound-guided core biopsy of the neck lymph node      06/07/2014 Pathology Results    Accession: [id number] biopsy confirmed diagnosis of Hodgkin lymphoma.      06/15/2014 Imaging    Echocardiogram showed preserved ejection fraction      07/09/2014 - 07/12/2014 Hospital Admission    She was admitted to the hospital for severe anemia.      07/27/2014 Procedure    She had placement of port      07/31/2014 - 09/11/2014 Chemotherapy    She received dose adjusted chemotherapy due to abnormal liver function tests and severe anemia. Treatment was delayed due to noncompliance  and subsequently stopped because the patient failed to keep appointments      01/11/2015 Imaging    Repeat PET CT scan showed response to treatment      01/28/2015 - 06/18/2015 Chemotherapy    ABVD was restarted with full dose.      02/08/2015 - 02/10/2015 Hospital Admission    The patient was admitted to the hospital due to pancytopenia and profuse diarrhea. Cultures were negative. She was placed on ciprofloxacin.      02/11/2015 Adverse Reaction    Treatment was placed on hold due to recent infection.      04/11/2015 Imaging    PET CT scan showed near complete response. Incidental finding of an abnormal bone lesion,  indeterminate. She is not symptomatic. Recommendation from [facility] to observe      07/11/2015 Imaging    PET CT scan showed abnormal new bone lesions, suggestive of possible disease progression      07/23/2015 Bone Marrow Biopsy    She underwent bone biopsy      07/23/2015 Pathology Results    Accession: [id number]  biopsy was negative for cancer      11/21/2015 Surgery    She had surgery for ectopic pregnancy      01/29/2016 Imaging    Ct chest, abdomen and pelvis showed pelvic and retroperitoneal lymphadenopathy, as above, concerning for residual disease. There is also a mildly enlarged posterior mediastinal lymph node measuring 11 mm adjacent to the distal descending thoracic aorta. This may represent an additional focus of disease, but is the only finding of concern in the thorax on today's examination. Sclerosis in the right ilium at site of previously noted metabolically active lesion, grossly unchanged. No other definite osseous lesions are identified on today's examination. Spleen is normal in size and appearance.      02/14/2016 PET scan    Interval disease worsening with new foci of hypermetabolic activity in multiple retroperitoneal and pelvic lymph nodes as well as AP window and left hilar lymph nodes. (Deauville 5). There is also overall worsening of the osseous disease.      05/11/2016 Pathology Results      Diagnosis Lymph node, needle/core biopsy, Left para-aortic retroperitoneal - CLASSICAL HODGKIN LYMPHOMA. - SEE ONCOLOGY TABLE. Microscopic Comment LYMPHOMA Histologic type: Classical Hodgkin lymphoma. Grade (if applicable): N/A Flow cytometry: Not done. Immunohistochemical stains: CD15, CD20, CD3, LCA, PAX-5, CD30 with appropriate controls. Touch preps/imprints: Not performed. Comments: The sections show small needle core biopsy fragments displaying a polymorphous cellular proliferation of small lymphocytes, plasma cells, eosinophils, and large atypical mononuclear and  multilobated lymphoid cells with features of Reed-Sternberg cells and variants. This is associated with patchy areas of fibrosis. Immunohistochemical stains were performed and show that the large atypical lymphoid cells are positive for CD30, CD15 and PAX-5 and negative for LCA, CD20, CD3. The small lymphoid cells in the background show a mixture of T and B cells with predominance of T cells. The overall morphologic and histologic features are consistent with classical Hodgkin lymphoma. Further subtyping is challenging in limited small biopsy fragments but the patchy fibrosis suggests nodular sclerosis type.       05/11/2016 Procedure    She underwent CT guided biopsy of retroperitoneal lymph node      05/26/2016 Procedure    Successful placement of a right IJ approach Power Port with ultrasound and fluoroscopic guidance. The catheter is ready for use.      06/02/2016 PET scan    Mixed response to chemotherapy with some lymph nodes decreased in metabolic activity and some lymph nodes increase metabolic activity. Lymph node stations including mediastinum, periaortic retroperitoneum, and obturator node stations. Activity is remains relatively intense Deauville 4 2. LEFT infrahilar nodule / lymph node with intense metabolic activity decreased from prior. ( Deauville 4 ). 3. 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 Deauville 5      06/03/2016 - 06/05/2016 Hospital Admission    She was admitted to the hospital for cycle 1 of ICE chemotherapy      06/24/2016 - 06/26/2016 Hospital Admission    She received cycle 2 of ICE chemo      07/07/2016 PET scan    Resolution of prior hypermetabolic adenopathy and resolution of prior osseous foci of hypermetabolic activity compatible with essentially complete response to therapy (Deauville 1). 2. Generalized reduced activity in the L4 vertebral body and the type of finding which would typically reflect prior radiation therapy. 3. Stable  septated fatty right pelvic lesion, possibly a dermoid or lipoma, not hypermetabolic       INTERVAL HISTORY: Please see below for problem oriented charting. She returns today with her mother. She has no recent menstruation. No new lymphadenopathy. She has been to the emergency department twice because of severe abdominal pain.  She had ultrasound and CT scan which show no evidence of active disease. PET CT scan today show complete remission. She continues to have diffuse musculoskeletal pain, well controlled with tramadol. No recent nausea, vomiting or diarrhea.  REVIEW OF SYSTEMS:   Constitutional: Denies fevers, chills or abnormal weight loss Eyes: Denies blurriness of vision Ears, nose, mouth, throat, and face: Denies mucositis or sore throat Respiratory: Denies cough, dyspnea or wheezes Cardiovascular: Denies palpitation, chest discomfort or lower extremity swelling Gastrointestinal:  Denies nausea, heartburn or change in bowel habits Skin: Denies abnormal skin rashes Lymphatics: Denies new lymphadenopathy or easy bruising Neurological:Denies numbness, tingling or new weaknesses Behavioral/Psych: Mood is stable, no new changes  All other systems were reviewed with the patient and are negative.  I have reviewed the past medical history, past surgical history, social history and family history with the patient and  they are unchanged from previous note.  ALLERGIES:  is allergic to temazepam.  MEDICATIONS:  Current Outpatient Prescriptions  Medication Sig Dispense Refill  . acyclovir ointment (ZOVIRAX) 5 % Apply 1 application topically every 3 (three) hours. 15 g 0  . albuterol (PROVENTIL HFA;VENTOLIN HFA) 108 (90 Base) MCG/ACT inhaler Inhale 2 puffs into the lungs every 4 (four) hours as needed for wheezing or shortness of breath. 1 Inhaler 0  . atazanavir (REYATAZ) 300 MG capsule TAKE ONE CAPSULE BY MOUTH EVERY MORNING WITH BREAKFAST. TAKE WITH NORVIR 30 capsule 3  .  cholecalciferol (VITAMIN D) 1000 units tablet Take 1 tablet (1,000 Units total) by mouth daily. 30 tablet 9  . dexamethasone (DECADRON) 4 MG tablet Take 1 at breakfast and 2nd dose at lunch with food for 2 days after chemo 10 tablet 0  . fluconazole (DIFLUCAN) 100 MG tablet Take 1 tablet (100 mg total) by mouth daily. 7 tablet 0  . medroxyPROGESTERone (DEPO-PROVERA) 150 MG/ML injection Inject 150 mg into the muscle every 3 (three) months.    . NORVIR 100 MG TABS tablet TAKE 1 TABLET BY MOUTH EVERY MORNING WITH BREAKFAST. TAKE WITH REYATAZ 30 tablet 5  . ondansetron (ZOFRAN) 8 MG tablet Take 1 tablet (8 mg total) by mouth every 8 (eight) hours as needed for nausea. 30 tablet 3  . polyethylene glycol (MIRALAX) packet Take 17 g by mouth daily as needed for mild constipation. 30 each 3  . prochlorperazine (COMPAZINE) 10 MG tablet Take 1 tablet (10 mg total) by mouth every 6 (six) hours as needed for nausea or vomiting. 30 tablet 3  . tetrahydrozoline 0.05 % ophthalmic solution Place 1 drop into both eyes 4 (four) times daily as needed (for eye irritation).    . traMADol (ULTRAM) 50 MG tablet Take 1 tablet (50 mg total) by mouth every 6 (six) hours as needed for moderate pain. 90 tablet 0  . TRUVADA 200-300 MG tablet TAKE 1 TABLET BY MOUTH EVERY DAY 30 tablet 5   No current facility-administered medications for this visit.     PHYSICAL EXAMINATION: ECOG PERFORMANCE STATUS: 1 - Symptomatic but completely ambulatory  Vitals:   07/07/16 1014  BP: 117/78  Pulse: 66  Resp: 16  Temp: 98 F (36.7 C)   Filed Weights   07/07/16 1014  Weight: 95 lb (43.1 kg)    GENERAL:alert, no distress and comfortable SKIN: skin color, texture, turgor are normal, no rashes or significant lesions EYES: normal, Conjunctiva are pink and non-injected, sclera clear OROPHARYNX:no exudate, no erythema and lips, buccal mucosa, and tongue normal  NECK: supple, thyroid normal size, non-tender, without nodularity LYMPH:   no palpable lymphadenopathy in the cervical, axillary or inguinal LUNGS: clear to auscultation and percussion with normal breathing effort HEART: regular rate & rhythm and no murmurs and no lower extremity edema ABDOMEN:abdomen soft, non-tender and normal bowel sounds Musculoskeletal:no cyanosis of digits and no clubbing  NEURO: alert & oriented x 3 with fluent speech, no focal motor/sensory deficits  LABORATORY DATA:  I have reviewed the data as listed    Component Value Date/Time   NA 138 07/07/2016 0933   K 3.3 (L) 07/07/2016 0933   CL 101 07/02/2016 1420   CO2 24 07/07/2016 0933   GLUCOSE 135 07/07/2016 0933   BUN 12.5 07/07/2016 0933   CREATININE 0.7 07/07/2016 0933   CALCIUM 9.0 07/07/2016 0933   PROT 6.6 07/07/2016 0933   ALBUMIN 3.8 07/07/2016 0933   AST 15 07/07/2016 0933  ALT 34 07/07/2016 0933   ALKPHOS 105 07/07/2016 0933   BILITOT 0.35 07/07/2016 0933   GFRNONAA >60 07/02/2016 1420   GFRNONAA 83 02/20/2016 0908   GFRAA >60 07/02/2016 1420   GFRAA >89 02/20/2016 0908    No results found for: SPEP, UPEP  Lab Results  Component Value Date   WBC 11.7 (H) 07/07/2016   NEUTROABS 9.6 (H) 07/07/2016   HGB 12.2 07/07/2016   HCT 35.7 07/07/2016   MCV 99.4 07/07/2016   PLT 43 (L) 07/07/2016      Chemistry      Component Value Date/Time   NA 138 07/07/2016 0933   K 3.3 (L) 07/07/2016 0933   CL 101 07/02/2016 1420   CO2 24 07/07/2016 0933   BUN 12.5 07/07/2016 0933   CREATININE 0.7 07/07/2016 0933      Component Value Date/Time   CALCIUM 9.0 07/07/2016 0933   ALKPHOS 105 07/07/2016 0933   AST 15 07/07/2016 0933   ALT 34 07/07/2016 0933   BILITOT 0.35 07/07/2016 0933       RADIOGRAPHIC STUDIES: I have personally reviewed the radiological images as listed and agreed with the findings in the report. Dg Neck Soft Tissue  Result Date: 06/17/2016 CLINICAL DATA:  Left-sided neck swelling under the jaw EXAM: NECK SOFT TISSUES - 1+ VIEW COMPARISON:  PET-CT  06/02/2016 FINDINGS: Prevertebral soft tissue thickness appears normal. There is straightening of the cervical vertebral bodies. Epiglottis is normal. Subglottic trachea is patent. Soft tissue fullness in the submandibular region. No gas collection. IMPRESSION: 1. Soft tissue fullness in the submandibular region. CT neck with contrast may be obtained for further evaluation as indicated. 2. Otherwise negative neck soft tissues. Electronically Signed   By: Jasmine Pang M.D.   On: 06/17/2016 23:59   Dg Chest 2 View  Result Date: 06/17/2016 CLINICAL DATA:  Acute onset of productive cough.  Initial encounter. EXAM: CHEST  2 VIEW COMPARISON:  PET/CT performed 06/02/2016 FINDINGS: The lungs are well-aerated and clear. There is no evidence of focal opacification, pleural effusion or pneumothorax. The heart is normal in size; the mediastinal contour is within normal limits. The right-sided chest port is noted ending about the mid to distal SVC. No acute osseous abnormalities are seen. IMPRESSION: No acute cardiopulmonary process seen. Electronically Signed   By: Roanna Raider M.D.   On: 06/17/2016 22:47   Ct Abdomen Pelvis W Contrast  Result Date: 07/02/2016 CLINICAL DATA:  White patches in throat, generalized abdominal pain and diarrhea beginning last night after 3 consecutive days of chemotherapy. History of HIV, lymphoma. EXAM: CT ABDOMEN AND PELVIS WITH CONTRAST TECHNIQUE: Multidetector CT imaging of the abdomen and pelvis was performed using the standard protocol following bolus administration of intravenous contrast. CONTRAST:  75 cc Isovue-300 COMPARISON:  PET- CT June 02, 2016 FINDINGS: LOWER CHEST: Lung bases are clear. Included heart size is normal. No pericardial effusion. HEPATOBILIARY: Liver and gallbladder are normal. PANCREAS: Normal. SPLEEN: Normal. ADRENALS/URINARY TRACT: Kidneys are orthotopic, demonstrating symmetric enhancement. No nephrolithiasis, hydronephrosis or solid renal masses. The  unopacified ureters are normal in course and caliber. Urinary bladder is partially distended and unremarkable. Normal adrenal glands. STOMACH/BOWEL: Mild suspected wall thickening of the sigmoid colon and rectum though limited assessment without The stomach, small bowel are normal in course and caliber without inflammatory changes. Normal appendix. Nodularity at the anus. VASCULAR/LYMPHATIC: Aortoiliac vessels are normal in course and caliber. Mild low-density retroperitoneal and pelvic lymphadenopathy, difficult to evaluate due to arterial contrast phase. REPRODUCTIVE:  Normal. Inter labial density suggesting debris, less likely vessel or fistula. OTHER: No intraperitoneal free fluid or free air. MUSCULOSKELETAL: Nonacute. IMPRESSION: Suspected proctitis. 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 Recommend direct inspection. Retroperitoneal and lymphadenopathy corresponding to patient's known lymphoma. Electronically Signed   By: Elon Alas M.D.   On: 07/02/2016 19:56   Nm Pet Image Restag (ps) Skull Base To Thigh  Result Date: 07/07/2016 CLINICAL DATA:  Subsequent treatment strategy for Hodgkin's lymphoma. EXAM: NUCLEAR MEDICINE PET SKULL BASE TO THIGH TECHNIQUE: 6.7 mCi F-18 FDG was injected intravenously. Full-ring PET imaging was performed from the skull base to thigh after the radiotracer. CT data was obtained and used for attenuation correction and anatomic localization. FASTING BLOOD GLUCOSE:  Value: 97 mg/dl COMPARISON:  Multiple exams, including 06/02/2016 FINDINGS: NECK No hypermetabolic lymph nodes in the neck. CHEST No hypermetabolic mediastinal or hilar nodes. No suspicious pulmonary nodules on the CT data. The previous hypermetabolic thoracic adenopathy has resolved and there are no appreciably enlarged lymph nodes. Paraseptal emphysema visible at the lung apices. ABDOMEN/PELVIS No abnormal hypermetabolic activity within the liver, pancreas, adrenal glands, or  spleen. No hypermetabolic lymph nodes in the abdomen or pelvis. The previous hypermetabolic and previously enlarged lymph nodes have resolved. Fatty lesion with internal septations along the medial margin of the right operator internus muscle on image 161/4 is stable an without obvious hypermetabolic activity, probably a dermoid or unusual lipoma. SKELETON No focal hypermetabolic activity to suggest skeletal metastasis. There is some generalized faintly accentuated marrow activity which may be a response to therapy. Moreover, there is generalized reduced activity at the L4 vertebral body possibly from prior radiation therapy. Previous bony pelvic lesions have resolved and previous lesions in the spine are no longer hypermetabolic. IMPRESSION: 1. Resolution of prior hypermetabolic adenopathy and resolution of prior osseous foci of hypermetabolic activity compatible with essentially complete response to therapy (Deauville 1). 2. Generalized reduced activity in the L4 vertebral body and the type of finding which would typically reflect prior radiation therapy. 3. Stable septated fatty right pelvic lesion, possibly a dermoid or lipoma, not hypermetabolic. Electronically Signed   By: Van Clines M.D.   On: 07/07/2016 10:00   US Abdomen Limited Ruq  Result Date: 07/02/2016 CLINICAL DATA:  Acute right upper quadrant abdominal pain. EXAM: US ABDOMEN LIMITED - RIGHT UPPER QUADRANT COMPARISON:  None. FINDINGS: Gallbladder: No gallstones or wall thickening visualized. No sonographic Murphy sign noted by sonographer. Common bile duct: Diameter: 1.6 mm which is within normal limits. Liver: No focal lesion identified. Within normal limits in parenchymal echogenicity. IMPRESSION: No abnormality seen in the right upper quadrant of the abdomen. Electronically Signed   By: Marijo Conception, M.D.   On: 07/02/2016 15:58    ASSESSMENT & PLAN:  Hodgkin lymphoma, nodular sclerosis (HCC) PET CT scan show complete response to  treatment. She has appointment to see her transplant physician at [facility]. I encouraged her to keep the appointment to discuss the role of bone marrow transplant as consolidation treatment.  Human immunodeficiency virus (HIV) disease ([place]) Her last HIV viral load was undetectable. She will continue anti-retroviral treatment  Thrombocytopenia ([place]) This is likely due to recent treatment. The patient denies recent history of bleeding such as epistaxis, hematuria or hematochezia. She is asymptomatic from the low platelet count. I will observe for now.  she does not require transfusion now.  Chronic pain of right wrist She has chronic wrist pain and diffuse  musculoskeletal painof unknown etiology. Examination is benign. I recommend vitamin D supplement. I refilled her prescription tramadol today.   No orders of the defined types were placed in this encounter.  All questions were answered. The patient knows to call the clinic with any problems, questions or concerns. No barriers to learning was detected. I spent 20 minutes counseling the patient face to face. The total time spent in the appointment was 30 minutes and more than 50% was on counseling and review of test results     Heath Lark, MD 07/07/2016 2:30 PM

## 2016-07-07 NOTE — Assessment & Plan Note (Signed)
This is likely due to recent treatment. The patient denies recent history of bleeding such as epistaxis, hematuria or hematochezia. She is asymptomatic from the low platelet count. I will observe for now.  she does not require transfusion now.  

## 2016-07-07 NOTE — Assessment & Plan Note (Signed)
She has chronic wrist pain and diffuse musculoskeletal pain of unknown etiology. Examination is benign. I recommend vitamin D supplement. I refilled her prescription tramadol today. 

## 2016-07-07 NOTE — Assessment & Plan Note (Signed)
PET CT scan show complete response to treatment. She has appointment to see her transplant physician at [facility]. I encouraged her to keep the appointment to discuss the role of bone marrow transplant as consolidation treatment.

## 2016-07-07 NOTE — Assessment & Plan Note (Signed)
Her last HIV viral load was undetectable. She will continue anti-retroviral treatment

## 2016-07-08 LAB — PREGNANCY, URINE: Pregnancy Test, Urine: NEGATIVE

## 2016-07-16 ENCOUNTER — Ambulatory Visit (INDEPENDENT_AMBULATORY_CARE_PROVIDER_SITE_OTHER): Payer: Medicaid Other | Admitting: Internal Medicine

## 2016-07-16 ENCOUNTER — Encounter: Payer: Self-pay | Admitting: Internal Medicine

## 2016-07-16 VITALS — BP 108/70 | HR 84 | Temp 98.4°F | Wt 106.0 lb

## 2016-07-16 DIAGNOSIS — E43 Unspecified severe protein-calorie malnutrition: Secondary | ICD-10-CM | POA: Diagnosis not present

## 2016-07-16 DIAGNOSIS — B20 Human immunodeficiency virus [HIV] disease: Secondary | ICD-10-CM | POA: Diagnosis not present

## 2016-07-16 NOTE — Assessment & Plan Note (Signed)
Improving, eating better

## 2016-07-16 NOTE — Assessment & Plan Note (Signed)
Doing good and will recheck in 3 months.  CD4 low with recent chemo and I anticipate it to come back up soon so no indication for OI prophylaxis

## 2016-07-16 NOTE — Progress Notes (Signed)
CC: Follow up for HIV  Interval history: Currently is asymptomatic and well-controlled on atazanavir, norvir and Truvada.  Since last visit she had a PET scan and is lymphoma free.  She also did go to [facility] for consideration of a bone marrow transplant but after her discussion with them, she and her mom decided to opt out of it.  She has had some weight gain.  she has no associated n/v.  She is eating well.   Denies any missed doses.      Prior to Admission medications   Medication Sig Start Date End Date Taking? Authorizing Provider  acyclovir ointment (ZOVIRAX) 5 % Apply 1 application topically every 3 (three) hours. 06/23/16  Yes Heath Lark, MD  albuterol (PROVENTIL HFA;VENTOLIN HFA) 108 (90 Base) MCG/ACT inhaler Inhale 2 puffs into the lungs every 4 (four) hours as needed for wheezing or shortness of breath. 04/18/16  Yes Tobie Poet, DO  atazanavir (REYATAZ) 300 MG capsule TAKE ONE CAPSULE BY MOUTH EVERY MORNING WITH BREAKFAST. TAKE WITH NORVIR 06/08/16  Yes Thayer Headings, MD  cholecalciferol (VITAMIN D) 1000 units tablet Take 1 tablet (1,000 Units total) by mouth daily. 06/05/16  Yes Ni Gorsuch, MD  NORVIR 100 MG TABS tablet TAKE 1 TABLET BY MOUTH EVERY MORNING WITH BREAKFAST. TAKE WITH REYATAZ 05/01/16  Yes Campbell Riches, MD  tetrahydrozoline 0.05 % ophthalmic solution Place 1 drop into both eyes 4 (four) times daily as needed (for eye irritation).   Yes Historical Provider, MD  traMADol (ULTRAM) 50 MG tablet Take 1 tablet (50 mg total) by mouth every 6 (six) hours as needed for moderate pain. 07/07/16  Yes Heath Lark, MD  TRUVADA 200-300 MG tablet TAKE 1 TABLET BY MOUTH EVERY DAY 05/01/16  Yes Campbell Riches, MD    Review of Systems Constitutional: negative for fatigue, malaise and anorexia Gastrointestinal: negative for diarrhea All other systems reviewed and are negative    Physical Exam: CONSTITUTIONAL:in no apparent distress and alert  Vitals:   07/16/16 0936  BP: 108/70   Pulse: 84  Temp: 98.4 F (36.9 C)   Eyes: anicteric HENT: no thrush, no cervical lymphadenopathy Respiratory: Normal respiratory effort; CTA B Cardiovascular: RRR  Lab Results  Component Value Date   HIV1RNAQUANT <20 NOT DETECTED 07/02/2016   HIV1RNAQUANT <20 02/20/2016   HIV1RNAQUANT 153 (H) 07/04/2015   No components found for: HIV1GENOTYPRPLUS No components found for: THELPERCELL

## 2016-07-22 ENCOUNTER — Telehealth: Payer: Self-pay | Admitting: *Deleted

## 2016-07-22 NOTE — Telephone Encounter (Signed)
[name] from [facility] states they are waiting to hear from Lithium regarding her decision.

## 2016-07-29 ENCOUNTER — Telehealth: Payer: Self-pay | Admitting: Hematology and Oncology

## 2016-07-29 NOTE — Telephone Encounter (Signed)
Appointments scheduled per in-basket message.   Patient notified °

## 2016-07-30 ENCOUNTER — Telehealth: Payer: Self-pay

## 2016-07-30 NOTE — Telephone Encounter (Signed)
I have scheduled return appt to talk to her

## 2016-07-30 NOTE — Telephone Encounter (Signed)
Wells Guiles at [facility] called to say she has left 2 messages with Caryl Pina. She has not heard from her about what she wants to do.

## 2016-08-11 ENCOUNTER — Telehealth: Payer: Self-pay | Admitting: Hematology and Oncology

## 2016-08-11 ENCOUNTER — Other Ambulatory Visit: Payer: Self-pay | Admitting: Hematology and Oncology

## 2016-08-11 DIAGNOSIS — B20 Human immunodeficiency virus [HIV] disease: Secondary | ICD-10-CM

## 2016-08-11 MED ORDER — TRAMADOL HCL 50 MG PO TABS: 50.0000 mg | ORAL_TABLET | Freq: Four times a day (QID) | ORAL | 0 refills | Status: DC | PRN

## 2016-08-11 NOTE — Telephone Encounter (Signed)
Patient requesting refill on Tramadol, states pain is 9 on a scale of 1-10, tingling in toes and fingers, took Advil last evening without relief.  DOB [birth year].   [place]

## 2016-08-18 ENCOUNTER — Other Ambulatory Visit (HOSPITAL_BASED_OUTPATIENT_CLINIC_OR_DEPARTMENT_OTHER): Payer: Medicare Other

## 2016-08-18 ENCOUNTER — Ambulatory Visit (HOSPITAL_BASED_OUTPATIENT_CLINIC_OR_DEPARTMENT_OTHER): Payer: Medicare Other | Admitting: Hematology and Oncology

## 2016-08-18 ENCOUNTER — Ambulatory Visit: Payer: Medicaid Other

## 2016-08-18 ENCOUNTER — Encounter: Payer: Self-pay | Admitting: Hematology and Oncology

## 2016-08-18 ENCOUNTER — Telehealth: Payer: Self-pay | Admitting: Hematology and Oncology

## 2016-08-18 ENCOUNTER — Other Ambulatory Visit (HOSPITAL_COMMUNITY)
Admission: AD | Admit: 2016-08-18 | Discharge: 2016-08-18 | Disposition: A | Discharge status: Home / Self Care | Payer: Medicare Other | Source: Ambulatory Visit | Attending: Hematology and Oncology | Admitting: Hematology and Oncology

## 2016-08-18 VITALS — BP 104/66 | HR 55 | Temp 98.5°F | Resp 18 | Ht 64.0 in | Wt 108.7 lb

## 2016-08-18 DIAGNOSIS — G8929 Other chronic pain: Secondary | ICD-10-CM | POA: Diagnosis not present

## 2016-08-18 DIAGNOSIS — B20 Human immunodeficiency virus [HIV] disease: Secondary | ICD-10-CM | POA: Diagnosis not present

## 2016-08-18 DIAGNOSIS — Z720 Tobacco use: Secondary | ICD-10-CM | POA: Diagnosis not present

## 2016-08-18 DIAGNOSIS — M2550 Pain in unspecified joint: Secondary | ICD-10-CM

## 2016-08-18 DIAGNOSIS — C8118 Nodular sclerosis classical Hodgkin lymphoma, lymph nodes of multiple sites: Secondary | ICD-10-CM | POA: Insufficient documentation

## 2016-08-18 DIAGNOSIS — F17210 Nicotine dependence, cigarettes, uncomplicated: Secondary | ICD-10-CM

## 2016-08-18 DIAGNOSIS — Z95828 Presence of other vascular implants and grafts: Secondary | ICD-10-CM

## 2016-08-18 LAB — COMPREHENSIVE METABOLIC PANEL
ALT: 17 U/L (ref 0–55)
AST: 18 U/L (ref 5–34)
Albumin: 4 g/dL (ref 3.5–5.0)
Alkaline Phosphatase: 80 U/L (ref 40–150)
Anion Gap: 9 mEq/L (ref 3–11)
BUN: 11.3 mg/dL (ref 7.0–26.0)
CALCIUM: 9.5 mg/dL (ref 8.4–10.4)
CHLORIDE: 105 meq/L (ref 98–109)
CO2: 26 meq/L (ref 22–29)
CREATININE: 0.8 mg/dL (ref 0.6–1.1)
EGFR: >90 mL/min/{1.73_m2} (ref >90)
Glucose: 74 mg/dl (ref 70–140)
Potassium: 3.6 mEq/L (ref 3.5–5.1)
Sodium: 139 mEq/L (ref 136–145)
Total Bilirubin: 1.56 mg/dL — ABNORMAL HIGH (ref 0.20–1.20)
Total Protein: 7.3 g/dL (ref 6.4–8.3)

## 2016-08-18 LAB — CBC WITH DIFFERENTIAL/PLATELET
BASO%: 1 % (ref 0.0–2.0)
BASOS ABS: 0 10*3/uL (ref 0.0–0.1)
EOS ABS: 0.2 10*3/uL (ref 0.0–0.5)
EOS%: 4.7 % (ref 0.0–7.0)
HCT: 39.1 % (ref 34.8–46.6)
HGB: 13.6 g/dL (ref 11.6–15.9)
LYMPH%: 39.9 % (ref 14.0–49.7)
MCH: 35.1 pg — AB (ref 25.1–34.0)
MCHC: 34.8 g/dL (ref 31.5–36.0)
MCV: 100.8 fL (ref 79.5–101.0)
MONO#: 0.3 10*3/uL (ref 0.1–0.9)
MONO%: 7.5 % (ref 0.0–14.0)
NEUT#: 2.1 10*3/uL (ref 1.5–6.5)
NEUT%: 46.9 % (ref 38.4–76.8)
Platelets: 219 10*3/uL (ref 145–400)
RBC: 3.88 10*6/uL (ref 3.70–5.45)
RDW: 14.3 % (ref 11.2–14.5)
WBC: 4.5 10*3/uL (ref 3.9–10.3)
lymph#: 1.8 10*3/uL (ref 0.9–3.3)

## 2016-08-18 LAB — PREGNANCY, URINE: PREG TEST UR: NEGATIVE

## 2016-08-18 MED ORDER — HEPARIN SOD (PORK) LOCK FLUSH 100 UNIT/ML IV SOLN
500.0000 [IU] | Freq: Once | INTRAVENOUS | Status: AC | PRN
  Administered 2016-08-18: 500 [IU] via INTRAVENOUS
  Filled 2016-08-18: qty 5

## 2016-08-18 MED ORDER — SODIUM CHLORIDE 0.9% FLUSH
10.0000 mL | INTRAVENOUS | Status: DC | PRN
  Administered 2016-08-18: 10 mL via INTRAVENOUS
  Filled 2016-08-18: qty 10

## 2016-08-18 NOTE — Assessment & Plan Note (Signed)
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

## 2016-08-18 NOTE — Assessment & Plan Note (Signed)
I spent some time counseling the patient the importance of tobacco cessation. she is currently attempting to quit on her own 

## 2016-08-18 NOTE — Progress Notes (Signed)
[facility] OFFICE PROGRESS NOTE  Patient Care Team: Robert W Comer, MD as PCP - General (Internal Medicine) Robert W Comer, MD as PCP - Infectious Diseases (Infectious Diseases) James G Arnold, MD as Consulting Physician (Obstetrics and Gynecology) Michael B Wert, MD as Consulting Physician (Pulmonary Disease) Steven C Hendrickson, MD as Consulting Physician (Cardiothoracic Surgery)  SUMMARY OF ONCOLOGIC HISTORY:   Hodgkin lymphoma, nodular sclerosis (HCC)   05/06/2014 Imaging    CT scan of the abdomen show diffuse mesenteric lymphadenopathy.      05/07/2014 Imaging    CT scan of the chest show right thoracic inlet lymphadenopathy      06/07/2014 Procedure    She underwent ultrasound-guided core biopsy of the neck lymph node      06/07/2014 Pathology Results    Accession: [id number] biopsy confirmed diagnosis of Hodgkin lymphoma.      06/15/2014 Imaging    Echocardiogram showed preserved ejection fraction      07/09/2014 - 07/12/2014 Hospital Admission    She was admitted to the hospital for severe anemia.      07/27/2014 Procedure    She had placement of port      07/31/2014 - 09/11/2014 Chemotherapy    She received dose adjusted chemotherapy due to abnormal liver function tests and severe anemia. Treatment was delayed due to noncompliance  and subsequently stopped because the patient failed to keep appointments      01/11/2015 Imaging    Repeat PET CT scan showed response to treatment      01/28/2015 - 06/18/2015 Chemotherapy    ABVD was restarted with full dose.      02/08/2015 - 02/10/2015 Hospital Admission    The patient was admitted to the hospital due to pancytopenia and profuse diarrhea. Cultures were negative. She was placed on ciprofloxacin.      02/11/2015 Adverse Reaction    Treatment was placed on hold due to recent infection.      04/11/2015 Imaging    PET CT scan showed near complete response. Incidental finding of an abnormal bone lesion,  indeterminate. She is not symptomatic. Recommendation from [facility] to observe      07/11/2015 Imaging    PET CT scan showed abnormal new bone lesions, suggestive of possible disease progression      07/23/2015 Bone Marrow Biopsy    She underwent bone biopsy      07/23/2015 Pathology Results    Accession: [id number]  biopsy was negative for cancer      11/21/2015 Surgery    She had surgery for ectopic pregnancy      01/29/2016 Imaging    Ct chest, abdomen and pelvis showed pelvic and retroperitoneal lymphadenopathy, as above, concerning for residual disease. There is also a mildly enlarged posterior mediastinal lymph node measuring 11 mm adjacent to the distal descending thoracic aorta. This may represent an additional focus of disease, but is the only finding of concern in the thorax on today's examination. Sclerosis in the right ilium at site of previously noted metabolically active lesion, grossly unchanged. No other definite osseous lesions are identified on today's examination. Spleen is normal in size and appearance.      02/14/2016 PET scan    Interval disease worsening with new foci of hypermetabolic activity in multiple retroperitoneal and pelvic lymph nodes as well as AP window and left hilar lymph nodes. (Deauville 5). There is also overall worsening of the osseous disease.      05/11/2016 Pathology Results      Diagnosis Lymph node, needle/core biopsy, Left para-aortic retroperitoneal - CLASSICAL HODGKIN LYMPHOMA. - SEE ONCOLOGY TABLE. Microscopic Comment LYMPHOMA Histologic type: Classical Hodgkin lymphoma. Grade (if applicable): N/A Flow cytometry: Not done. Immunohistochemical stains: CD15, CD20, CD3, LCA, PAX-5, CD30 with appropriate controls. Touch preps/imprints: Not performed. Comments: The sections show small needle core biopsy fragments displaying a polymorphous cellular proliferation of small lymphocytes, plasma cells, eosinophils, and large atypical mononuclear and  multilobated lymphoid cells with features of Reed-Sternberg cells and variants. This is associated with patchy areas of fibrosis. Immunohistochemical stains were performed and show that the large atypical lymphoid cells are positive for CD30, CD15 and PAX-5 and negative for LCA, CD20, CD3. The small lymphoid cells in the background show a mixture of T and B cells with predominance of T cells. The overall morphologic and histologic features are consistent with classical Hodgkin lymphoma. Further subtyping is challenging in limited small biopsy fragments but the patchy fibrosis suggests nodular sclerosis type.       05/11/2016 Procedure    She underwent CT guided biopsy of retroperitoneal lymph node      05/26/2016 Procedure    Successful placement of a right IJ approach Power Port with ultrasound and fluoroscopic guidance. The catheter is ready for use.      06/02/2016 PET scan    Mixed response to chemotherapy with some lymph nodes decreased in metabolic activity and some lymph nodes increase metabolic activity. Lymph node stations including mediastinum, periaortic retroperitoneum, and obturator node stations. Activity is remains relatively intense Deauville 4 2. LEFT infrahilar nodule / lymph node with intense metabolic activity decreased from prior. ( Deauville 4 ). 3. 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 Deauville 5      06/03/2016 - 06/05/2016 Hospital Admission    She was admitted to the hospital for cycle 1 of ICE chemotherapy      06/24/2016 - 06/26/2016 Hospital Admission    She received cycle 2 of ICE chemo      07/07/2016 PET scan    Resolution of prior hypermetabolic adenopathy and resolution of prior osseous foci of hypermetabolic activity compatible with essentially complete response to therapy (Deauville 1). 2. Generalized reduced activity in the L4 vertebral body and the type of finding which would typically reflect prior radiation therapy. 3. Stable  septated fatty right pelvic lesion, possibly a dermoid or lipoma, not hypermetabolic       INTERVAL HISTORY: Please see below for problem oriented charting. She returns for further follow-up Most of the side effects of treatment had resolved She continues to be amenorrheic with intermittent hot flashes No recent infection or new lymphadenopathy She has chronic joint pain, relieved by tramadol  REVIEW OF SYSTEMS:   Constitutional: Denies fevers, chills or abnormal weight loss Eyes: Denies blurriness of vision Ears, nose, mouth, throat, and face: Denies mucositis or sore throat Respiratory: Denies cough, dyspnea or wheezes Cardiovascular: Denies palpitation, chest discomfort or lower extremity swelling Gastrointestinal:  Denies nausea, heartburn or change in bowel habits Skin: Denies abnormal skin rashes Lymphatics: Denies new lymphadenopathy or easy bruising Neurological:Denies numbness, tingling or new weaknesses Behavioral/Psych: Mood is stable, no new changes  All other systems were reviewed with the patient and are negative.  I have reviewed the past medical history, past surgical history, social history and family history with the patient and they are unchanged from previous note.  ALLERGIES:  is allergic to temazepam.  MEDICATIONS:  Current Outpatient Prescriptions  Medication Sig Dispense Refill  . acyclovir ointment (  ZOVIRAX) 5 % Apply 1 application topically every 3 (three) hours. 15 g 0  . albuterol (PROVENTIL HFA;VENTOLIN HFA) 108 (90 Base) MCG/ACT inhaler Inhale 2 puffs into the lungs every 4 (four) hours as needed for wheezing or shortness of breath. 1 Inhaler 0  . atazanavir (REYATAZ) 300 MG capsule TAKE ONE CAPSULE BY MOUTH EVERY MORNING WITH BREAKFAST. TAKE WITH NORVIR 30 capsule 3  . cholecalciferol (VITAMIN D) 1000 units tablet Take 1 tablet (1,000 Units total) by mouth daily. 30 tablet 9  . NORVIR 100 MG TABS tablet TAKE 1 TABLET BY MOUTH EVERY MORNING WITH  BREAKFAST. TAKE WITH REYATAZ 30 tablet 5  . tetrahydrozoline 0.05 % ophthalmic solution Place 1 drop into both eyes 4 (four) times daily as needed (for eye irritation).    . traMADol (ULTRAM) 50 MG tablet Take 1 tablet (50 mg total) by mouth every 6 (six) hours as needed for moderate pain. 90 tablet 0  . TRUVADA 200-300 MG tablet TAKE 1 TABLET BY MOUTH EVERY DAY 30 tablet 5   No current facility-administered medications for this visit.     PHYSICAL EXAMINATION: ECOG PERFORMANCE STATUS: 1 - Symptomatic but completely ambulatory  Vitals:   08/18/16 1324  BP: 104/66  Pulse: (!) 55  Resp: 18  Temp: 98.5 F (36.9 C)   Filed Weights   08/18/16 1324  Weight: 108 lb 11.2 oz (49.3 kg)    GENERAL:alert, no distress and comfortable SKIN: skin color, texture, turgor are normal, no rashes or significant lesions EYES: normal, Conjunctiva are pink and non-injected, sclera clear OROPHARYNX:no exudate, no erythema and lips, buccal mucosa, and tongue normal  NECK: supple, thyroid normal size, non-tender, without nodularity LYMPH:  no palpable lymphadenopathy in the cervical, axillary or inguinal LUNGS: clear to auscultation and percussion with normal breathing effort HEART: regular rate & rhythm and no murmurs and no lower extremity edema ABDOMEN:abdomen soft, non-tender and normal bowel sounds Musculoskeletal:no cyanosis of digits and no clubbing  NEURO: alert & oriented x 3 with fluent speech, no focal motor/sensory deficits  LABORATORY DATA:  I have reviewed the data as listed    Component Value Date/Time   NA 139 08/18/2016 1213   K 3.6 08/18/2016 1213   CL 101 07/02/2016 1420   CO2 26 08/18/2016 1213   GLUCOSE 74 08/18/2016 1213   BUN 11.3 08/18/2016 1213   CREATININE 0.8 08/18/2016 1213   CALCIUM 9.5 08/18/2016 1213   PROT 7.3 08/18/2016 1213   ALBUMIN 4.0 08/18/2016 1213   AST 18 08/18/2016 1213   ALT 17 08/18/2016 1213   ALKPHOS 80 08/18/2016 1213   BILITOT 1.56 (H)  08/18/2016 1213   GFRNONAA >60 07/02/2016 1420   GFRNONAA 83 02/20/2016 0908   GFRAA >60 07/02/2016 1420   GFRAA >89 02/20/2016 0908    No results found for: SPEP, UPEP  Lab Results  Component Value Date   WBC 4.5 08/18/2016   NEUTROABS 2.1 08/18/2016   HGB 13.6 08/18/2016   HCT 39.1 08/18/2016   MCV 100.8 08/18/2016   PLT 219 08/18/2016      Chemistry      Component Value Date/Time   NA 139 08/18/2016 1213   K 3.6 08/18/2016 1213   CL 101 07/02/2016 1420   CO2 26 08/18/2016 1213   BUN 11.3 08/18/2016 1213   CREATININE 0.8 08/18/2016 1213      Component Value Date/Time   CALCIUM 9.5 08/18/2016 1213   ALKPHOS 80 08/18/2016 1213   AST 18 08/18/2016 1213  ALT 17 08/18/2016 1213   BILITOT 1.56 (H) 08/18/2016 1213       ASSESSMENT & PLAN:  Hodgkin lymphoma, nodular sclerosis ([place]) The patient has high risk, stage IV disease With recent ICE chemotherapy, she has achieved complete remission She was evaluated at [facility] to consider autologous stem cell transplant which the patient declined She understood that without bone marrow transplant, who risks of relapse is very high I recommend repeat blood work, history and examination along with PET CT scan in June to evaluate and exclude disease progression  Human immunodeficiency virus (HIV) disease ([place]) Her last HIV viral load was undetectable. She will continue anti-retroviral treatment  Cigarette smoker I spent some time counseling the patient the importance of tobacco cessation. she is currently attempting to quit on her own     Orders Placed This Encounter  Procedures  . NM PET Image Restag (PS) Skull Base To Thigh    Standing Status:   Future    Standing Expiration Date:   09/22/2017    Order Specific Question:   Reason for exam:    Answer:   staging Hodgkin lymphoma, assess response to chemo    Order Specific Question:   Preferred imaging location?    Answer:   [facility]   All questions were answered. The patient knows to call the clinic with any problems, questions or concerns. No barriers to learning was detected. I spent 15 minutes counseling the patient face to face. The total time spent in the appointment was 20 minutes and more than 50% was on counseling and review of test results     Heath Lark, MD 08/18/2016 1:53 PM

## 2016-08-18 NOTE — Telephone Encounter (Signed)
Gave patient AVS and calender per 4/24 los.  

## 2016-08-18 NOTE — Assessment & Plan Note (Signed)
Her last HIV viral load was undetectable. She will continue anti-retroviral treatment

## 2016-08-18 NOTE — Patient Instructions (Signed)

## 2016-09-02 ENCOUNTER — Telehealth: Payer: Self-pay

## 2016-09-02 NOTE — Telephone Encounter (Signed)
Patient called and left message that she wanted nurse to call her. Called patient back, she is complaining of dark circles around her eyes, she feels like it is getting worse. She states that people keep asking her if she has a black eye.

## 2016-09-02 NOTE — Telephone Encounter (Signed)
Left below message. 

## 2016-09-02 NOTE — Telephone Encounter (Signed)
Recommend PCP evaluation

## 2016-09-10 ENCOUNTER — Telehealth: Payer: Self-pay

## 2016-09-10 ENCOUNTER — Other Ambulatory Visit: Payer: Self-pay | Admitting: Hematology and Oncology

## 2016-09-10 DIAGNOSIS — B20 Human immunodeficiency virus [HIV] disease: Secondary | ICD-10-CM

## 2016-09-10 NOTE — Telephone Encounter (Signed)
Pt called requesting tramadol refill.

## 2016-09-10 NOTE — Telephone Encounter (Signed)
Called and left message that Tramadol is ready for pickup.

## 2016-09-10 NOTE — Telephone Encounter (Signed)
Pt called and asked that tramadol be called in. Done.

## 2016-09-15 ENCOUNTER — Telehealth: Payer: Self-pay | Admitting: *Deleted

## 2016-09-15 MED ORDER — LIDOCAINE 2% (20 MG/ML) 5 ML SYRINGE
INTRAMUSCULAR | Status: AC
  Filled 2016-09-15: qty 5

## 2016-09-15 NOTE — Telephone Encounter (Signed)
Patient called stating she went to pick up her medication and was told by [place] that she needed to call the MD office; because the Norvir was not going through. Spoke to [place] at [place] and [place] was not paying for the ritonovir, however when he ran it as Norvir it went through. There is just a $3.00 copay. Patient knows to go back to the pharmacy to pick up her medications. Myrtis Hopping

## 2016-10-07 ENCOUNTER — Other Ambulatory Visit (HOSPITAL_BASED_OUTPATIENT_CLINIC_OR_DEPARTMENT_OTHER): Payer: Medicare Other

## 2016-10-07 ENCOUNTER — Ambulatory Visit (HOSPITAL_BASED_OUTPATIENT_CLINIC_OR_DEPARTMENT_OTHER): Payer: Medicare Other

## 2016-10-07 VITALS — BP 106/46 | HR 56 | Temp 97.2°F | Resp 20

## 2016-10-07 DIAGNOSIS — C8118 Nodular sclerosis classical Hodgkin lymphoma, lymph nodes of multiple sites: Secondary | ICD-10-CM

## 2016-10-07 DIAGNOSIS — Z95828 Presence of other vascular implants and grafts: Secondary | ICD-10-CM

## 2016-10-07 LAB — COMPREHENSIVE METABOLIC PANEL
ALBUMIN: 3.9 g/dL (ref 3.5–5.0)
ALK PHOS: 83 U/L (ref 40–150)
ALT: 19 U/L (ref 0–55)
ANION GAP: 7 meq/L (ref 3–11)
AST: 18 U/L (ref 5–34)
BILIRUBIN TOTAL: 0.57 mg/dL (ref 0.20–1.20)
BUN: 13.9 mg/dL (ref 7.0–26.0)
CO2: 24 mEq/L (ref 22–29)
Calcium: 9.4 mg/dL (ref 8.4–10.4)
Chloride: 106 mEq/L (ref 98–109)
Creatinine: 0.8 mg/dL (ref 0.6–1.1)
Glucose: 79 mg/dl (ref 70–140)
Potassium: 4.1 mEq/L (ref 3.5–5.1)
SODIUM: 137 meq/L (ref 136–145)
TOTAL PROTEIN: 7.2 g/dL (ref 6.4–8.3)

## 2016-10-07 LAB — CBC WITH DIFFERENTIAL/PLATELET
BASO%: 0.2 % (ref 0.0–2.0)
Basophils Absolute: 0 10*3/uL (ref 0.0–0.1)
EOS%: 2.1 % (ref 0.0–7.0)
Eosinophils Absolute: 0.1 10*3/uL (ref 0.0–0.5)
HEMATOCRIT: 41.2 % (ref 34.8–46.6)
HGB: 14 g/dL (ref 11.6–15.9)
LYMPH#: 1.3 10*3/uL (ref 0.9–3.3)
LYMPH%: 23.6 % (ref 14.0–49.7)
MCH: 34.1 pg — ABNORMAL HIGH (ref 25.1–34.0)
MCHC: 34 g/dL (ref 31.5–36.0)
MCV: 100.2 fL (ref 79.5–101.0)
MONO#: 0.5 10*3/uL (ref 0.1–0.9)
MONO%: 8.2 % (ref 0.0–14.0)
NEUT#: 3.7 10*3/uL (ref 1.5–6.5)
NEUT%: 65.9 % (ref 38.4–76.8)
Platelets: 208 10*3/uL (ref 145–400)
RBC: 4.11 10*6/uL (ref 3.70–5.45)
RDW: 12.4 % (ref 11.2–14.5)
WBC: 5.6 10*3/uL (ref 3.9–10.3)

## 2016-10-07 MED ORDER — HEPARIN SOD (PORK) LOCK FLUSH 100 UNIT/ML IV SOLN
500.0000 [IU] | Freq: Once | INTRAVENOUS | Status: AC | PRN
  Administered 2016-10-07: 500 [IU] via INTRAVENOUS
  Filled 2016-10-07: qty 5

## 2016-10-07 MED ORDER — SODIUM CHLORIDE 0.9% FLUSH
10.0000 mL | INTRAVENOUS | Status: DC | PRN
  Administered 2016-10-07: 10 mL via INTRAVENOUS
  Filled 2016-10-07: qty 10

## 2016-10-07 NOTE — Patient Instructions (Signed)

## 2016-10-08 ENCOUNTER — Telehealth: Payer: Self-pay | Admitting: Hematology and Oncology

## 2016-10-08 ENCOUNTER — Telehealth: Payer: Self-pay | Admitting: *Deleted

## 2016-10-08 ENCOUNTER — Ambulatory Visit: Payer: Medicaid Other | Admitting: Hematology and Oncology

## 2016-10-08 LAB — PREGNANCY, URINE: Pregnancy Test, Urine: NEGATIVE

## 2016-10-08 NOTE — Telephone Encounter (Signed)
-----   Message from Heath Lark, MD sent at 10/08/2016  7:09 AM EDT ----- Regarding: rescheduled appt Since PET is delayed until Monday, I have placed scheduling msg to reschedule appt to see me on Tuesday at 945 am Please call her and tell her not to come in today

## 2016-10-08 NOTE — Telephone Encounter (Signed)
lvm to inform pt of r/s 6/14 appt to 6/19 at 0945 per sch msg

## 2016-10-08 NOTE — Telephone Encounter (Signed)
Notified of message below

## 2016-10-12 ENCOUNTER — Ambulatory Visit (HOSPITAL_COMMUNITY)
Admission: RE | Admit: 2016-10-12 | Discharge: 2016-10-12 | Disposition: A | Discharge status: Home / Self Care | Payer: Medicare Other | Source: Ambulatory Visit | Attending: Hematology and Oncology | Admitting: Hematology and Oncology

## 2016-10-12 ENCOUNTER — Encounter (HOSPITAL_COMMUNITY): Payer: Self-pay | Admitting: Radiology

## 2016-10-12 DIAGNOSIS — Z79899 Other long term (current) drug therapy: Secondary | ICD-10-CM | POA: Insufficient documentation

## 2016-10-12 DIAGNOSIS — C7951 Secondary malignant neoplasm of bone: Secondary | ICD-10-CM | POA: Diagnosis not present

## 2016-10-12 DIAGNOSIS — C8118 Nodular sclerosis classical Hodgkin lymphoma, lymph nodes of multiple sites: Secondary | ICD-10-CM

## 2016-10-12 LAB — GLUCOSE, CAPILLARY: Glucose-Capillary: 94 mg/dL (ref 65–99)

## 2016-10-12 MED ORDER — FLUDEOXYGLUCOSE F - 18 (FDG) INJECTION
5.3500 | Freq: Once | INTRAVENOUS | Status: AC | PRN
  Administered 2016-10-12: 5.35 via INTRAVENOUS

## 2016-10-13 ENCOUNTER — Ambulatory Visit (HOSPITAL_BASED_OUTPATIENT_CLINIC_OR_DEPARTMENT_OTHER): Payer: Medicare Other | Admitting: Hematology and Oncology

## 2016-10-13 ENCOUNTER — Encounter: Payer: Self-pay | Admitting: Hematology and Oncology

## 2016-10-13 ENCOUNTER — Other Ambulatory Visit: Payer: Medicaid Other

## 2016-10-13 DIAGNOSIS — M25531 Pain in right wrist: Secondary | ICD-10-CM

## 2016-10-13 DIAGNOSIS — C8118 Nodular sclerosis classical Hodgkin lymphoma, lymph nodes of multiple sites: Secondary | ICD-10-CM

## 2016-10-13 DIAGNOSIS — G8929 Other chronic pain: Secondary | ICD-10-CM | POA: Diagnosis not present

## 2016-10-13 DIAGNOSIS — Z7189 Other specified counseling: Secondary | ICD-10-CM | POA: Diagnosis not present

## 2016-10-13 DIAGNOSIS — B20 Human immunodeficiency virus [HIV] disease: Secondary | ICD-10-CM | POA: Diagnosis not present

## 2016-10-13 MED ORDER — TRAMADOL HCL 50 MG PO TABS: 50.0000 mg | ORAL_TABLET | Freq: Four times a day (QID) | ORAL | 0 refills | Status: DC | PRN

## 2016-10-13 NOTE — Progress Notes (Signed)
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      05/07/2014 Imaging    CT scan of the chest show right thoracic inlet lymphadenopathy      06/07/2014 Procedure    She underwent ultrasound-guided core biopsy of the neck lymph node      06/07/2014 Pathology Results    Accession: [id number] biopsy confirmed diagnosis of Hodgkin lymphoma.      06/15/2014 Imaging    Echocardiogram showed preserved ejection fraction      07/09/2014 - 07/12/2014 Hospital Admission    She was admitted to the hospital for severe anemia.      07/27/2014 Procedure    She had placement of port      07/31/2014 - 09/11/2014 Chemotherapy    She received dose adjusted chemotherapy due to abnormal liver function tests and severe anemia. Treatment was delayed due to noncompliance  and subsequently stopped because the patient failed to keep appointments      01/11/2015 Imaging    Repeat PET CT scan showed response to treatment      01/28/2015 - 06/18/2015 Chemotherapy    ABVD was restarted with full dose.      02/08/2015 - 02/10/2015 Hospital Admission    The patient was admitted to the hospital due to pancytopenia and profuse diarrhea. Cultures were negative. She was placed on ciprofloxacin.      02/11/2015 Adverse Reaction    Treatment was placed on hold due to recent infection.      04/11/2015 Imaging    PET CT scan showed near complete response. Incidental finding of an abnormal bone  lesion, indeterminate. She is not symptomatic. Recommendation from [facility] to observe      07/11/2015 Imaging    PET CT scan showed abnormal new bone lesions, suggestive of possible disease progression      07/23/2015 Bone Marrow Biopsy    She underwent bone biopsy      07/23/2015 Pathology Results    Accession: [id number]  biopsy was negative for cancer      11/21/2015 Surgery    She had surgery for ectopic pregnancy      01/29/2016 Imaging    Ct chest, abdomen and pelvis showed pelvic and retroperitoneal lymphadenopathy, as above, concerning for residual disease. There is also a mildly enlarged posterior mediastinal lymph node measuring 11 mm adjacent to the distal descending thoracic aorta. This may represent an additional focus of disease, but is the only finding of concern in the thorax on today's examination. Sclerosis in the right ilium at site of previously noted metabolically active lesion, grossly unchanged. No other definite osseous lesions are identified on today's examination. Spleen is normal in size and appearance.      02/14/2016 PET scan    Interval disease worsening with new foci of hypermetabolic activity in multiple retroperitoneal and pelvic lymph nodes as well as AP window and left hilar lymph nodes. (Deauville 5). There is also overall worsening of the osseous disease.      05/11/2016 Pathology Results  Diagnosis Lymph node, needle/core biopsy, Left para-aortic retroperitoneal - CLASSICAL HODGKIN LYMPHOMA. - SEE ONCOLOGY TABLE. Microscopic Comment LYMPHOMA Histologic type: Classical Hodgkin lymphoma. Grade (if applicable): N/A Flow cytometry: Not done. Immunohistochemical stains: CD15, CD20, CD3, LCA, PAX-5, CD30 with appropriate controls. Touch preps/imprints: Not performed. Comments: The sections show small needle core biopsy fragments displaying a polymorphous cellular proliferation of small lymphocytes, plasma cells, eosinophils, and large atypical  mononuclear and multilobated lymphoid cells with features of Reed-Sternberg cells and variants. This is associated with patchy areas of fibrosis. Immunohistochemical stains were performed and show that the large atypical lymphoid cells are positive for CD30, CD15 and PAX-5 and negative for LCA, CD20, CD3. The small lymphoid cells in the background show a mixture of T and B cells with predominance of T cells. The overall morphologic and histologic features are consistent with classical Hodgkin lymphoma. Further subtyping is challenging in limited small biopsy fragments but the patchy fibrosis suggests nodular sclerosis type.       05/11/2016 Procedure    She underwent CT guided biopsy of retroperitoneal lymph node      05/26/2016 Procedure    Successful placement of a right IJ approach Power Port with ultrasound and fluoroscopic guidance. The catheter is ready for use.      06/02/2016 PET scan    Mixed response to chemotherapy with some lymph nodes decreased in metabolic activity and some lymph nodes increase metabolic activity. Lymph node stations including mediastinum, periaortic retroperitoneum, and obturator node stations. Activity is remains relatively intense Deauville 4 2. LEFT infrahilar nodule / lymph node with intense metabolic activity decreased from prior. ( Deauville 4 ). 3. 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 Deauville 5      06/03/2016 - 06/05/2016 Hospital Admission    She was admitted to the hospital for cycle 1 of ICE chemotherapy      06/24/2016 - 06/26/2016 Hospital Admission    She received cycle 2 of ICE chemo      07/07/2016 PET scan    Resolution of prior hypermetabolic adenopathy and resolution of prior osseous foci of hypermetabolic activity compatible with essentially complete response to therapy (Deauville 1). 2. Generalized reduced activity in the L4 vertebral body and the type of finding which would typically reflect prior radiation  therapy. 3. Stable septated fatty right pelvic lesion, possibly a dermoid or lipoma, not hypermetabolic      0/76/2263 PET scan    Hypermetabolic lesion along the L3 vertebral body and left posterior elements, max SUV 7.8 (Deauville 5). Hypermetabolic lesion along the left inferior pubic ramus, max SUV 4.9 (Deauville 4).  IMPRESSION: Prevascular lymphadenopathy, reflecting nodal recurrence (Deauville 4). Hypermetabolic osseous metastases involving the L3 vertebral body/posterior elements and left inferior pubic ramus (Deauville 4-5). Hypermetabolism along the endometrium, new, possibly reactive/physiologic. Consider pelvic ultrasound and/or endometrial sampling as clinically warranted.       INTERVAL HISTORY: Please see below for problem oriented charting. She returns for further follow-up She has return of menstrual cycles but they were light She denies new lymphadenopathy Her appetite is stable and she has no recent weight loss She denies recent infection She continues to have chronic musculoskeletal bone pain, stable on tramadol  REVIEW OF SYSTEMS:   Constitutional: Denies fevers, chills or abnormal weight loss Eyes: Denies blurriness of vision Ears, nose, mouth, throat, and face: Denies mucositis or sore throat Respiratory: Denies cough, dyspnea or wheezes Cardiovascular: Denies palpitation, chest discomfort or lower extremity swelling Gastrointestinal:  Denies nausea, heartburn or  change in bowel habits Skin: Denies abnormal skin rashes Lymphatics: Denies new lymphadenopathy or easy bruising Neurological:Denies numbness, tingling or new weaknesses Behavioral/Psych: Mood is stable, no new changes  All other systems were reviewed with the patient and are negative.  I have reviewed the past medical history, past surgical history, social history and family history with the patient and they are unchanged from previous note.  ALLERGIES:  is allergic to temazepam.  MEDICATIONS:   Current Outpatient Prescriptions  Medication Sig Dispense Refill  . acyclovir ointment (ZOVIRAX) 5 % Apply 1 application topically every 3 (three) hours. 15 g 0  . albuterol (PROVENTIL HFA;VENTOLIN HFA) 108 (90 Base) MCG/ACT inhaler Inhale 2 puffs into the lungs every 4 (four) hours as needed for wheezing or shortness of breath. 1 Inhaler 0  . atazanavir (REYATAZ) 300 MG capsule TAKE ONE CAPSULE BY MOUTH EVERY MORNING WITH BREAKFAST. TAKE WITH NORVIR 30 capsule 3  . cholecalciferol (VITAMIN D) 1000 units tablet Take 1 tablet (1,000 Units total) by mouth daily. 30 tablet 9  . NORVIR 100 MG TABS tablet TAKE 1 TABLET BY MOUTH EVERY MORNING WITH BREAKFAST. TAKE WITH REYATAZ 30 tablet 5  . tetrahydrozoline 0.05 % ophthalmic solution Place 1 drop into both eyes 4 (four) times daily as needed (for eye irritation).    . traMADol (ULTRAM) 50 MG tablet Take 1 tablet (50 mg total) by mouth every 6 (six) hours as needed. 90 tablet 0  . TRUVADA 200-300 MG tablet TAKE 1 TABLET BY MOUTH EVERY DAY 30 tablet 5   No current facility-administered medications for this visit.     PHYSICAL EXAMINATION: ECOG PERFORMANCE STATUS: 1 - Symptomatic but completely ambulatory  Vitals:   10/13/16 0907  BP: 100/61  Pulse: 60  Resp: 18  Temp: 98.5 F (36.9 C)   Filed Weights   10/13/16 0907  Weight: 108 lb 14.4 oz (49.4 kg)    GENERAL:alert, no distress and comfortable SKIN: skin color, texture, turgor are normal, no rashes or significant lesions EYES: normal, Conjunctiva are pink and non-injected, sclera clear OROPHARYNX:no exudate, no erythema and lips, buccal mucosa, and tongue normal  NECK: supple, thyroid normal size, non-tender, without nodularity LYMPH:  no palpable lymphadenopathy in the cervical, axillary or inguinal LUNGS: clear to auscultation and percussion with normal breathing effort HEART: regular rate & rhythm and no murmurs and no lower extremity edema ABDOMEN:abdomen soft, non-tender and  normal bowel sounds Musculoskeletal:no cyanosis of digits and no clubbing  NEURO: alert & oriented x 3 with fluent speech, no focal motor/sensory deficits  LABORATORY DATA:  I have reviewed the data as listed    Component Value Date/Time   NA 137 10/07/2016 0806   K 4.1 10/07/2016 0806   CL 101 07/02/2016 1420   CO2 24 10/07/2016 0806   GLUCOSE 79 10/07/2016 0806   BUN 13.9 10/07/2016 0806   CREATININE 0.8 10/07/2016 0806   CALCIUM 9.4 10/07/2016 0806   PROT 7.2 10/07/2016 0806   ALBUMIN 3.9 10/07/2016 0806   AST 18 10/07/2016 0806   ALT 19 10/07/2016 0806   ALKPHOS 83 10/07/2016 0806   BILITOT 0.57 10/07/2016 0806   GFRNONAA >60 07/02/2016 1420   GFRNONAA 83 02/20/2016 0908   GFRAA >60 07/02/2016 1420   GFRAA >89 02/20/2016 0908    No results found for: SPEP, UPEP  Lab Results  Component Value Date   WBC 5.6 10/07/2016   NEUTROABS 3.7 10/07/2016   HGB 14.0 10/07/2016   HCT 41.2 10/07/2016  MCV 100.2 10/07/2016   PLT 208 10/07/2016      Chemistry      Component Value Date/Time   NA 137 10/07/2016 0806   K 4.1 10/07/2016 0806   CL 101 07/02/2016 1420   CO2 24 10/07/2016 0806   BUN 13.9 10/07/2016 0806   CREATININE 0.8 10/07/2016 0806      Component Value Date/Time   CALCIUM 9.4 10/07/2016 0806   ALKPHOS 83 10/07/2016 0806   AST 18 10/07/2016 0806   ALT 19 10/07/2016 0806   BILITOT 0.57 10/07/2016 0806       RADIOGRAPHIC STUDIES: I have personally reviewed the radiological images as listed and agreed with the findings in the report. Nm Pet Image Restag (ps) Skull Base To Thigh  Result Date: 10/12/2016 CLINICAL DATA:  Subsequent treatment strategy for Hodgkin's lymphoma. EXAM: NUCLEAR MEDICINE PET SKULL BASE TO THIGH TECHNIQUE: 5.35 mCi F-18 FDG was injected intravenously. Full-ring PET imaging was performed from the skull base to thigh after the radiotracer. CT data was obtained and used for attenuation correction and anatomic localization. FASTING BLOOD  GLUCOSE:  Value: 94 mg/dl COMPARISON:  PET-CT dated 07/07/2016 FINDINGS: NECK No hypermetabolic lymph nodes in the neck. CHEST Prevascular lymphadenopathy, measuring up to 13 mm short axis (series 4/image 9), max SUV 4.3 (Deauville 4). This is new/recurrent from recent PET-CT. No suspicious pulmonary nodules. Right chest port, terminating at the cavoatrial junction. ABDOMEN/PELVIS No abnormal hypermetabolic activity within the liver, pancreas, adrenal glands, or spleen. Spleen is normal in size. Background liver hypermetabolism max SUV 2.1. Background spleen hypermetabolism max SUV 2.0. No hypermetabolic lymph nodes in the abdomen or pelvis. Hypermetabolism along the endometrium, new, max SUV 7.2. It is unclear whether this is reactive/physiologic. 3.2 cm benign right ovarian dermoid (series 4/image 162). SKELETON Hypermetabolic lesion along the L3 vertebral body and left posterior elements, max SUV 7.8 (Deauville 5). Hypermetabolic lesion along the left inferior pubic ramus, max SUV 4.9 (Deauville 4). IMPRESSION: Prevascular lymphadenopathy, reflecting nodal recurrence (Deauville 4). Hypermetabolic osseous metastases involving the L3 vertebral body/posterior elements and left inferior pubic ramus (Deauville 4-5). Hypermetabolism along the endometrium, new, possibly reactive/physiologic. Consider pelvic ultrasound and/or endometrial sampling as clinically warranted. Electronically Signed   By: Julian Hy M.D.   On: 10/12/2016 11:34    ASSESSMENT & PLAN:  Hodgkin lymphoma, nodular sclerosis (HCC) Unfortunately, PET CT scan show signs of disease recurrence She is not symptomatic The patient has made informed decision not to pursue aggressive chemotherapy or bone marrow transplant I reviewed current guidelines with the patient I think it is reasonable to consider single agent Brentuximab every 3 weeks She understood that the goals of treatment is strictly palliative in nature She agreed to wait until  after Fourth of July weekend to resume treatment The risks, benefits, side effects of treatment including risk of pancytopenia, neuropathy and allergic reaction were discussed with the patient and she agreed to proceed I will also resume Lupron injection to suppress her menstruation while on treatment  Chronic pain of right wrist She has chronic wrist pain and diffuse musculoskeletal painof unknown etiology. Examination is benign. I recommend vitamin D supplement. I refilled her prescription tramadol today.  Human immunodeficiency virus (HIV) disease ([place]) Her last HIV viral load was undetectable. She will continue anti-retroviral treatment  Goals of care, counseling/discussion The patient is aware she has incurable disease and treatment is strictly palliative. We discussed importance of Advanced Directives and Living will. We discussed CODE STATUS; the patient desires to remain  in full code.   No orders of the defined types were placed in this encounter.  All questions were answered. The patient knows to call the clinic with any problems, questions or concerns. No barriers to learning was detected. I spent 30 minutes counseling the patient face to face. The total time spent in the appointment was 40 minutes and more than 50% was on counseling and review of test results     Heath Lark, MD 10/13/2016 6:03 PM

## 2016-10-13 NOTE — Progress Notes (Signed)
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     A cycle is every 21 days:     Etoposide        Dose Mod: None     Carboplatin        Dose Mod: None     Ifosfamide        Dose Mod: None     [place]        Dose Mod: None     Pegfilgrastim        Dose Mod: None  **Always confirm dose/schedule in your pharmacy ordering system**    REASON: Disease Progression PRIOR TREATMENT: [id number]: ICE (CIV ifosfamide) q21 Days (Inpatient) x 2-4 Cycles Followed by Referral to Transplant TREATMENT RESPONSE: Progressive Disease (PD)  START ON PATHWAY REGIMEN - Lymphoma and CLL     A cycle is every 3 weeks:     Brentuximab vedotin   **Always confirm dose/schedule in your pharmacy ordering system**    Patient Characteristics: Classic Hodgkin Lymphoma, Third Line or Relapse After Autologous Transplant Disease Type: Classic Hodgkin Lymphoma Disease Type: Not Applicable Line of therapy: Third Line or Relapse After Autologous Transplant [place] Stage: IVA Intent of Therapy: Non-Curative / Palliative Intent, Discussed with Patient

## 2016-10-13 NOTE — Assessment & Plan Note (Signed)
Her last HIV viral load was undetectable. She will continue anti-retroviral treatment

## 2016-10-13 NOTE — Assessment & Plan Note (Signed)
Unfortunately, PET CT scan show signs of disease recurrence She is not symptomatic The patient has made informed decision not to pursue aggressive chemotherapy or bone marrow transplant I reviewed current guidelines with the patient I think it is reasonable to consider single agent Brentuximab every 3 weeks She understood that the goals of treatment is strictly palliative in nature She agreed to wait until after Fourth of July weekend to resume treatment The risks, benefits, side effects of treatment including risk of pancytopenia, neuropathy and allergic reaction were discussed with the patient and she agreed to proceed I will also resume Lupron injection to suppress her menstruation while on treatment

## 2016-10-13 NOTE — Assessment & Plan Note (Signed)
The patient is aware she has incurable disease and treatment is strictly palliative. We discussed importance of Advanced Directives and Living will. We discussed CODE STATUS; the patient desires to remain in full code. 

## 2016-10-13 NOTE — Assessment & Plan Note (Signed)
She has chronic wrist pain and diffuse musculoskeletal pain of unknown etiology. Examination is benign. I recommend vitamin D supplement. I refilled her prescription tramadol today. 

## 2016-10-15 ENCOUNTER — Telehealth: Payer: Self-pay

## 2016-10-15 NOTE — Telephone Encounter (Signed)
Spoke with patient about her 7/11 appts.  Also left a vm for melissa d if appts booked will not work to advise  [employer]

## 2016-10-16 ENCOUNTER — Other Ambulatory Visit: Payer: Self-pay | Admitting: *Deleted

## 2016-10-16 ENCOUNTER — Other Ambulatory Visit: Payer: Self-pay | Admitting: Internal Medicine

## 2016-10-16 DIAGNOSIS — B20 Human immunodeficiency virus [HIV] disease: Secondary | ICD-10-CM

## 2016-10-16 MED ORDER — EMTRICITABINE-TENOFOVIR DF 200-300 MG PO TABS: 1.0000 | ORAL_TABLET | Freq: Every day | ORAL | 5 refills | Status: DC

## 2016-10-16 MED ORDER — ATAZANAVIR SULFATE 300 MG PO CAPS: 300.0000 mg | ORAL_CAPSULE | Freq: Every day | ORAL | 5 refills | Status: DC

## 2016-10-16 MED ORDER — RITONAVIR 100 MG PO TABS: ORAL_TABLET | ORAL | 5 refills | Status: DC

## 2016-10-27 ENCOUNTER — Ambulatory Visit: Payer: Medicaid Other | Admitting: Internal Medicine

## 2016-11-04 ENCOUNTER — Other Ambulatory Visit (HOSPITAL_COMMUNITY)
Admission: RE | Admit: 2016-11-04 | Discharge: 2016-11-04 | Disposition: A | Discharge status: Home / Self Care | Payer: Medicare Other | Source: Ambulatory Visit | Attending: Hematology and Oncology | Admitting: Hematology and Oncology

## 2016-11-04 ENCOUNTER — Other Ambulatory Visit (HOSPITAL_BASED_OUTPATIENT_CLINIC_OR_DEPARTMENT_OTHER): Payer: Medicare Other

## 2016-11-04 ENCOUNTER — Ambulatory Visit (HOSPITAL_BASED_OUTPATIENT_CLINIC_OR_DEPARTMENT_OTHER): Payer: Medicare Other

## 2016-11-04 ENCOUNTER — Other Ambulatory Visit (HOSPITAL_COMMUNITY): Payer: Self-pay | Admitting: Pharmacist

## 2016-11-04 ENCOUNTER — Other Ambulatory Visit: Payer: Self-pay | Admitting: Hematology and Oncology

## 2016-11-04 ENCOUNTER — Ambulatory Visit (HOSPITAL_BASED_OUTPATIENT_CLINIC_OR_DEPARTMENT_OTHER): Payer: Medicare Other | Admitting: Hematology and Oncology

## 2016-11-04 DIAGNOSIS — Z5111 Encounter for antineoplastic chemotherapy: Secondary | ICD-10-CM

## 2016-11-04 DIAGNOSIS — M25531 Pain in right wrist: Secondary | ICD-10-CM | POA: Diagnosis not present

## 2016-11-04 DIAGNOSIS — Z5112 Encounter for antineoplastic immunotherapy: Secondary | ICD-10-CM | POA: Diagnosis present

## 2016-11-04 DIAGNOSIS — G8929 Other chronic pain: Secondary | ICD-10-CM | POA: Diagnosis not present

## 2016-11-04 DIAGNOSIS — Z95828 Presence of other vascular implants and grafts: Secondary | ICD-10-CM

## 2016-11-04 DIAGNOSIS — C8118 Nodular sclerosis classical Hodgkin lymphoma, lymph nodes of multiple sites: Secondary | ICD-10-CM

## 2016-11-04 DIAGNOSIS — B20 Human immunodeficiency virus [HIV] disease: Secondary | ICD-10-CM

## 2016-11-04 DIAGNOSIS — Z720 Tobacco use: Secondary | ICD-10-CM | POA: Diagnosis not present

## 2016-11-04 DIAGNOSIS — R17 Unspecified jaundice: Secondary | ICD-10-CM

## 2016-11-04 DIAGNOSIS — F17210 Nicotine dependence, cigarettes, uncomplicated: Secondary | ICD-10-CM

## 2016-11-04 LAB — COMPREHENSIVE METABOLIC PANEL
ALBUMIN: 4.2 g/dL (ref 3.5–5.0)
ALK PHOS: 78 U/L (ref 40–150)
ALT: 21 U/L (ref 0–55)
AST: 19 U/L (ref 5–34)
Anion Gap: 10 mEq/L (ref 3–11)
BILIRUBIN TOTAL: 2.41 mg/dL — AB (ref 0.20–1.20)
BUN: 9.4 mg/dL (ref 7.0–26.0)
CO2: 23 meq/L (ref 22–29)
CREATININE: 0.9 mg/dL (ref 0.6–1.1)
Calcium: 9.6 mg/dL (ref 8.4–10.4)
Chloride: 105 mEq/L (ref 98–109)
EGFR: >90 mL/min/{1.73_m2} (ref >90)
GLUCOSE: 92 mg/dL (ref 70–140)
Potassium: 4 mEq/L (ref 3.5–5.1)
Sodium: 138 mEq/L (ref 136–145)
TOTAL PROTEIN: 7.5 g/dL (ref 6.4–8.3)

## 2016-11-04 LAB — CBC WITH DIFFERENTIAL/PLATELET
BASO%: 0.8 % (ref 0.0–2.0)
BASOS ABS: 0.1 10*3/uL (ref 0.0–0.1)
EOS%: 1.5 % (ref 0.0–7.0)
Eosinophils Absolute: 0.1 10*3/uL (ref 0.0–0.5)
HCT: 44.4 % (ref 34.8–46.6)
HGB: 15.3 g/dL (ref 11.6–15.9)
LYMPH%: 25.7 % (ref 14.0–49.7)
MCH: 34.3 pg — AB (ref 25.1–34.0)
MCHC: 34.4 g/dL (ref 31.5–36.0)
MCV: 99.7 fL (ref 79.5–101.0)
MONO#: 0.6 10*3/uL (ref 0.1–0.9)
MONO%: 8.7 % (ref 0.0–14.0)
NEUT#: 4.6 10*3/uL (ref 1.5–6.5)
NEUT%: 63.3 % (ref 38.4–76.8)
Platelets: 210 10*3/uL (ref 145–400)
RBC: 4.45 10*6/uL (ref 3.70–5.45)
RDW: 13.2 % (ref 11.2–14.5)
WBC: 7.2 10*3/uL (ref 3.9–10.3)
lymph#: 1.9 10*3/uL (ref 0.9–3.3)

## 2016-11-04 LAB — PREGNANCY, URINE: PREG TEST UR: NEGATIVE

## 2016-11-04 MED ORDER — TRAMADOL HCL 50 MG PO TABS: 50.0000 mg | ORAL_TABLET | Freq: Four times a day (QID) | ORAL | 0 refills | Status: DC | PRN

## 2016-11-04 MED ORDER — SODIUM CHLORIDE 0.9 % IV SOLN
Freq: Once | INTRAVENOUS | Status: AC
  Administered 2016-11-04: 09:00:00 via INTRAVENOUS

## 2016-11-04 MED ORDER — ACETAMINOPHEN 325 MG PO TABS
ORAL_TABLET | ORAL | Status: AC
  Filled 2016-11-04: qty 2

## 2016-11-04 MED ORDER — HEPARIN SOD (PORK) LOCK FLUSH 100 UNIT/ML IV SOLN
500.0000 [IU] | Freq: Once | INTRAVENOUS | Status: AC | PRN
  Administered 2016-11-04: 500 [IU]
  Filled 2016-11-04: qty 5

## 2016-11-04 MED ORDER — DIPHENHYDRAMINE HCL 50 MG/ML IJ SOLN
50.0000 mg | Freq: Once | INTRAMUSCULAR | Status: AC
  Administered 2016-11-04: 50 mg via INTRAVENOUS

## 2016-11-04 MED ORDER — DIPHENHYDRAMINE HCL 50 MG/ML IJ SOLN
INTRAMUSCULAR | Status: AC
  Filled 2016-11-04: qty 1

## 2016-11-04 MED ORDER — LEUPROLIDE ACETATE 7.5 MG IM KIT
7.5000 mg | PACK | Freq: Once | INTRAMUSCULAR | Status: AC
  Administered 2016-11-04: 7.5 mg via INTRAMUSCULAR
  Filled 2016-11-04: qty 7.5

## 2016-11-04 MED ORDER — SODIUM CHLORIDE 0.9% FLUSH
10.0000 mL | INTRAVENOUS | Status: DC | PRN
  Administered 2016-11-04: 10 mL
  Filled 2016-11-04: qty 10

## 2016-11-04 MED ORDER — DEXAMETHASONE SODIUM PHOSPHATE 10 MG/ML IJ SOLN
INTRAMUSCULAR | Status: AC
  Filled 2016-11-04: qty 1

## 2016-11-04 MED ORDER — DIPHENHYDRAMINE HCL 25 MG PO CAPS
ORAL_CAPSULE | ORAL | Status: AC
  Filled 2016-11-04: qty 2

## 2016-11-04 MED ORDER — DEXAMETHASONE SODIUM PHOSPHATE 10 MG/ML IJ SOLN
10.0000 mg | Freq: Once | INTRAMUSCULAR | Status: AC
  Administered 2016-11-04: 10 mg via INTRAVENOUS

## 2016-11-04 MED ORDER — ACETAMINOPHEN 325 MG PO TABS
650.0000 mg | ORAL_TABLET | Freq: Once | ORAL | Status: AC
  Administered 2016-11-04: 650 mg via ORAL

## 2016-11-04 MED ORDER — SODIUM CHLORIDE 0.9 % IV SOLN
1.8000 mg/kg | Freq: Once | INTRAVENOUS | Status: AC
  Administered 2016-11-04: 90 mg via INTRAVENOUS
  Filled 2016-11-04: qty 18

## 2016-11-04 NOTE — Patient Instructions (Signed)
[facility] Discharge Instructions for Patients Receiving Chemotherapy  Today you received the following chemotherapy agents: Brentuximab   To help prevent nausea and vomiting after your treatment, we encourage you to take your nausea medication as prescribed. If you develop nausea and vomiting that is not controlled by your nausea medication, call the clinic.   BELOW ARE SYMPTOMS THAT SHOULD BE REPORTED IMMEDIATELY:  *FEVER GREATER THAN 100.5 F  *CHILLS WITH OR WITHOUT FEVER  NAUSEA AND VOMITING THAT IS NOT CONTROLLED WITH YOUR NAUSEA MEDICATION  *UNUSUAL SHORTNESS OF BREATH  *UNUSUAL BRUISING OR BLEEDING  TENDERNESS IN MOUTH AND THROAT WITH OR WITHOUT PRESENCE OF ULCERS  *URINARY PROBLEMS  *BOWEL PROBLEMS  UNUSUAL RASH Items with * indicate a potential emergency and should be followed up as soon as possible.  Feel free to call the clinic you have any questions or concerns. The clinic phone number is [phone].  Please show the [facility] at check-in to the Emergency Department and triage nurse.  Brentuximab vedotin solution for injection What is this medicine? BRENTUXIMAB VEDOTIN (bren TUX see mab ve DOE tin) is a monoclonal antibody and a chemotherapy drug. It is used for treating Hodgkin lymphoma and certain non-Hodgkin lymphomas, such as anaplastic large-cell lymphoma and mycosis fungoides. This medicine may be used for other purposes; ask your health care provider or pharmacist if you have questions. COMMON BRAND NAME(S): ADCETRIS What should I tell my health care provider before I take this medicine? They need to know if you have any of these conditions: -immune system problems -infection (especially a virus infection such as chickenpox, cold sores, or herpes) -kidney disease -liver disease -low blood counts, like low white cell, platelet, or red cell counts -tingling of the fingers or toes, or other nerve disorder -an unusual or allergic  reaction to brentuximab vedotin, other medicines, foods, dyes, or preservatives -pregnant or trying to get pregnant -breast-feeding How should I use this medicine? This medicine is for infusion into a vein. It is given by a health care professional in a hospital or clinic setting. Talk to your pediatrician regarding the use of this medicine in children. Special care may be needed. Overdosage: If you think you have taken too much of this medicine contact a poison control center or emergency room at once. NOTE: This medicine is only for you. Do not share this medicine with others. What if I miss a dose? It is important not to miss your dose. Call your doctor or health care professional if you are unable to keep an appointment. What may interact with this medicine? This medicine may interact with the following medications: -ketoconazole -rifampin -St. John's wort; Hypericum perforatum This list may not describe all possible interactions. Give your health care provider a list of all the medicines, herbs, non-prescription drugs, or dietary supplements you use. Also tell them if you smoke, drink alcohol, or use illegal drugs. Some items may interact with your medicine. What should I watch for while using this medicine? Visit your doctor for checks on your progress. This drug may make you feel generally unwell. Report any side effects. Continue your course of treatment even though you feel ill unless your doctor tells you to stop. Call your doctor or health care professional for advice if you get a fever, chills or sore throat, or other symptoms of a cold or flu. Do not treat yourself. This drug decreases your body's ability to fight infections. Try to avoid being around people who are  sick. This medicine may increase your risk to bruise or bleed. Call your doctor or health care professional if you notice any unusual bleeding. In some patients, this medicine may cause a serious brain infection that may  cause death. If you have any problems seeing, thinking, speaking, walking, or standing, tell your doctor right away. If you cannot reach your doctor, urgently seek other source of medical care. Do not become pregnant while taking this medicine or for 6 months after stopping it. Women should inform their doctor if they wish to become pregnant or think they might be pregnant. Men should not father a child while taking this medicine and for 6 months after stopping it. There is a potential for serious side effects to an unborn child. Talk to your health care professional or pharmacist for more information. Do not breast-feed an infant while taking this medicine. This may interfere with the ability to father a child. You should talk to your doctor or health care professional if you are concerned about your fertility. What side effects may I notice from receiving this medicine? Side effects that you should report to your doctor or health care professional as soon as possible: -allergic reactions like skin rash, itching or hives, swelling of the face, lips, or tongue -changes in emotions or moods -diarrhea -low blood counts - this medicine may decrease the number of white blood cells, red blood cells and platelets. You may be at increased risk for infections and bleeding. -pain, tingling, numbness in the hands or feet -redness, blistering, peeling or loosening of the skin, including inside the mouth -shortness of breath -signs of infection - fever or chills, cough, sore throat, pain or difficulty passing urine -signs of decreased platelets or bleeding - bruising, pinpoint red spots on the skin, black, tarry stools, blood in the urine -signs of decreased red blood cells - unusually weak or tired, fainting spells, lightheadedness -signs of liver injury like dark yellow or brown urine; general ill feeling or flu-like symptoms; light-colored stools; loss of appetite; nausea; right upper belly pain; yellowing of  the eyes or skin -stomach pain -sudden numbness or weakness of the face, arm or leg -vomiting Side effects that usually do not require medical attention (report to your doctor or health care professional if they continue or are bothersome): -dizziness -headache -muscle pain -tiredness This list may not describe all possible side effects. Call your doctor for medical advice about side effects. You may report side effects to FDA at 1-800-FDA-1088. Where should I keep my medicine? This drug is given in a hospital or clinic and will not be stored at home. NOTE: This sheet is a summary. It may not cover all possible information. If you have questions about this medicine, talk to your doctor, pharmacist, or health care provider.  2018 [employer] Standard (2016-03-06 18:06:58) Leuprolide depot injection What is this medicine? LEUPROLIDE (loo PROE lide) is a man-made protein that acts like a natural hormone in the body. It decreases testosterone in men and decreases estrogen in women. In men, this medicine is used to treat advanced prostate cancer. In women, some forms of this medicine may be used to treat endometriosis, uterine fibroids, or other female hormone-related problems. This medicine may be used for other purposes; ask your health care provider or pharmacist if you have questions. COMMON BRAND NAME(S): Eligard, Lupron Depot, Lupron Depot-Ped, Viadur What should I tell my health care provider before I take this medicine? They need to know if you have any of  these conditions: -diabetes -heart disease or previous heart attack -high blood pressure -high cholesterol -mental illness -osteoporosis -pain or difficulty passing urine -seizures -spinal cord metastasis -stroke -suicidal thoughts, plans, or attempt; a previous suicide attempt by you or a family member -tobacco smoker -unusual vaginal bleeding (women) -an unusual or allergic reaction to leuprolide, benzyl alcohol, other  medicines, foods, dyes, or preservatives -pregnant or trying to get pregnant -breast-feeding How should I use this medicine? This medicine is for injection into a muscle or for injection under the skin. It is given by a health care professional in a hospital or clinic setting. The specific product will determine how it will be given to you. Make sure you understand which product you receive and how often you will receive it. Talk to your pediatrician regarding the use of this medicine in children. Special care may be needed. Overdosage: If you think you have taken too much of this medicine contact a poison control center or emergency room at once. NOTE: This medicine is only for you. Do not share this medicine with others. What if I miss a dose? It is important not to miss a dose. Call your doctor or health care professional if you are unable to keep an appointment. Depot injections: Depot injections are given either once-monthly, every 12 weeks, every 16 weeks, or every 24 weeks depending on the product you are prescribed. The product you are prescribed will be based on if you are female or female, and your condition. Make sure you understand your product and dosing. What may interact with this medicine? Do not take this medicine with any of the following medications: -chasteberry This medicine may also interact with the following medications: -herbal or dietary supplements, like black cohosh or DHEA -female hormones, like estrogens or progestins and birth control pills, patches, rings, or injections -female hormones, like testosterone This list may not describe all possible interactions. Give your health care provider a list of all the medicines, herbs, non-prescription drugs, or dietary supplements you use. Also tell them if you smoke, drink alcohol, or use illegal drugs. Some items may interact with your medicine. What should I watch for while using this medicine? Visit your doctor or health care  professional for regular checks on your progress. During the first weeks of treatment, your symptoms may get worse, but then will improve as you continue your treatment. You may get hot flashes, increased bone pain, increased difficulty passing urine, or an aggravation of nerve symptoms. Discuss these effects with your doctor or health care professional, some of them may improve with continued use of this medicine. Female patients may experience a menstrual cycle or spotting during the first months of therapy with this medicine. If this continues, contact your doctor or health care professional. What side effects may I notice from receiving this medicine? Side effects that you should report to your doctor or health care professional as soon as possible: -allergic reactions like skin rash, itching or hives, swelling of the face, lips, or tongue -breathing problems -chest pain -depression or memory disorders -pain in your legs or groin -pain at site where injected or implanted -seizures -severe headache -swelling of the feet and legs -suicidal thoughts or other mood changes -visual changes -vomiting Side effects that usually do not require medical attention (report to your doctor or health care professional if they continue or are bothersome): -breast swelling or tenderness -decrease in sex drive or performance -diarrhea -hot flashes -loss of appetite -muscle, joint, or bone pains -  nausea -redness or irritation at site where injected or implanted -skin problems or acne This list may not describe all possible side effects. Call your doctor for medical advice about side effects. You may report side effects to FDA at 1-800-FDA-1088. Where should I keep my medicine? This drug is given in a hospital or clinic and will not be stored at home. NOTE: This sheet is a summary. It may not cover all possible information. If you have questions about this medicine, talk to your doctor, pharmacist, or  health care provider.  2018 [employer] Standard (2015-09-26 09:45:53)

## 2016-11-05 ENCOUNTER — Other Ambulatory Visit (HOSPITAL_COMMUNITY): Payer: Self-pay | Admitting: Pharmacist

## 2016-11-06 ENCOUNTER — Encounter: Payer: Self-pay | Admitting: Hematology and Oncology

## 2016-11-06 NOTE — Assessment & Plan Note (Signed)
She feels well with no recent changes since our last discussion We will proceed with treatment She will get Lupron injection to suppress ovarian function I recommend minimum 3 doses of chemotherapy before repeat imaging Since I will be away, we will delay second cycle by 1 week

## 2016-11-06 NOTE — Assessment & Plan Note (Signed)
Her last HIV viral load was undetectable. She will continue anti-retroviral treatment

## 2016-11-06 NOTE — Assessment & Plan Note (Signed)
I spent some time counseling the patient the importance of tobacco cessation. she is currently attempting to quit on her own 

## 2016-11-06 NOTE — Progress Notes (Signed)
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      05/07/2014 Imaging    CT scan of the chest show right thoracic inlet lymphadenopathy      06/07/2014 Procedure    She underwent ultrasound-guided core biopsy of the neck lymph node      06/07/2014 Pathology Results    Accession: [id number] biopsy confirmed diagnosis of Hodgkin lymphoma.      06/15/2014 Imaging    Echocardiogram showed preserved ejection fraction      07/09/2014 - 07/12/2014 Hospital Admission    She was admitted to the hospital for severe anemia.      07/27/2014 Procedure    She had placement of port      07/31/2014 - 09/11/2014 Chemotherapy    She received dose adjusted chemotherapy due to abnormal liver function tests and severe anemia. Treatment was delayed due to noncompliance  and subsequently stopped because the patient failed to keep appointments      01/11/2015 Imaging    Repeat PET CT scan showed response to treatment      01/28/2015 - 06/18/2015 Chemotherapy    ABVD was restarted with full dose.      02/08/2015 - 02/10/2015 Hospital Admission    The patient was admitted to the hospital due to pancytopenia and profuse diarrhea. Cultures were negative. She was placed on ciprofloxacin.      02/11/2015 Adverse Reaction    Treatment was placed on hold due to recent infection.      04/11/2015 Imaging    PET CT scan showed near complete response. Incidental finding of an abnormal bone  lesion, indeterminate. She is not symptomatic. Recommendation from [facility] to observe      07/11/2015 Imaging    PET CT scan showed abnormal new bone lesions, suggestive of possible disease progression      07/23/2015 Bone Marrow Biopsy    She underwent bone biopsy      07/23/2015 Pathology Results    Accession: [id number]  biopsy was negative for cancer      11/21/2015 Surgery    She had surgery for ectopic pregnancy      01/29/2016 Imaging    Ct chest, abdomen and pelvis showed pelvic and retroperitoneal lymphadenopathy, as above, concerning for residual disease. There is also a mildly enlarged posterior mediastinal lymph node measuring 11 mm adjacent to the distal descending thoracic aorta. This may represent an additional focus of disease, but is the only finding of concern in the thorax on today's examination. Sclerosis in the right ilium at site of previously noted metabolically active lesion, grossly unchanged. No other definite osseous lesions are identified on today's examination. Spleen is normal in size and appearance.      02/14/2016 PET scan    Interval disease worsening with new foci of hypermetabolic activity in multiple retroperitoneal and pelvic lymph nodes as well as AP window and left hilar lymph nodes. (Deauville 5). There is also overall worsening of the osseous disease.      05/11/2016 Pathology Results  Diagnosis Lymph node, needle/core biopsy, Left para-aortic retroperitoneal - CLASSICAL HODGKIN LYMPHOMA. - SEE ONCOLOGY TABLE. Microscopic Comment LYMPHOMA Histologic type: Classical Hodgkin lymphoma. Grade (if applicable): N/A Flow cytometry: Not done. Immunohistochemical stains: CD15, CD20, CD3, LCA, PAX-5, CD30 with appropriate controls. Touch preps/imprints: Not performed. Comments: The sections show small needle core biopsy fragments displaying a polymorphous cellular proliferation of small lymphocytes, plasma cells, eosinophils, and large atypical  mononuclear and multilobated lymphoid cells with features of Reed-Sternberg cells and variants. This is associated with patchy areas of fibrosis. Immunohistochemical stains were performed and show that the large atypical lymphoid cells are positive for CD30, CD15 and PAX-5 and negative for LCA, CD20, CD3. The small lymphoid cells in the background show a mixture of T and B cells with predominance of T cells. The overall morphologic and histologic features are consistent with classical Hodgkin lymphoma. Further subtyping is challenging in limited small biopsy fragments but the patchy fibrosis suggests nodular sclerosis type.       05/11/2016 Procedure    She underwent CT guided biopsy of retroperitoneal lymph node      05/26/2016 Procedure    Successful placement of a right IJ approach Power Port with ultrasound and fluoroscopic guidance. The catheter is ready for use.      06/02/2016 PET scan    Mixed response to chemotherapy with some lymph nodes decreased in metabolic activity and some lymph nodes increase metabolic activity. Lymph node stations including mediastinum, periaortic retroperitoneum, and obturator node stations. Activity is remains relatively intense Deauville 4 2. LEFT infrahilar nodule / lymph node with intense metabolic activity decreased from prior. ( Deauville 4 ). 3. 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 Deauville 5      06/03/2016 - 06/05/2016 Hospital Admission    She was admitted to the hospital for cycle 1 of ICE chemotherapy      06/24/2016 - 06/26/2016 Hospital Admission    She received cycle 2 of ICE chemo      07/07/2016 PET scan    Resolution of prior hypermetabolic adenopathy and resolution of prior osseous foci of hypermetabolic activity compatible with essentially complete response to therapy (Deauville 1). 2. Generalized reduced activity in the L4 vertebral body and the type of finding which would typically reflect prior radiation  therapy. 3. Stable septated fatty right pelvic lesion, possibly a dermoid or lipoma, not hypermetabolic      8/33/8250 PET scan    Hypermetabolic lesion along the L3 vertebral body and left posterior elements, max SUV 7.8 (Deauville 5). Hypermetabolic lesion along the left inferior pubic ramus, max SUV 4.9 (Deauville 4).  IMPRESSION: Prevascular lymphadenopathy, reflecting nodal recurrence (Deauville 4). Hypermetabolic osseous metastases involving the L3 vertebral body/posterior elements and left inferior pubic ramus (Deauville 4-5). Hypermetabolism along the endometrium, new, possibly reactive/physiologic. Consider pelvic ultrasound and/or endometrial sampling as clinically warranted.       INTERVAL HISTORY: Please see below for problem oriented charting. She returns for further follow-up She denies recent new lymphadenopathy or bone pain in her back She have chronic wrist pain that she takes tramadol She continues to smoke but attempting to quit smoking   REVIEW OF SYSTEMS:   Constitutional: Denies fevers, chills or abnormal weight loss Eyes: Denies blurriness of vision Ears, nose, mouth, throat, and face: Denies mucositis or sore throat Respiratory: Denies cough, dyspnea or wheezes Cardiovascular: Denies palpitation, chest discomfort or lower extremity swelling Gastrointestinal:  Denies nausea, heartburn or change in bowel habits Skin: Denies abnormal skin rashes Lymphatics:  Denies new lymphadenopathy or easy bruising Neurological:Denies numbness, tingling or new weaknesses Behavioral/Psych: Mood is stable, no new changes  All other systems were reviewed with the patient and are negative.  I have reviewed the past medical history, past surgical history, social history and family history with the patient and they are unchanged from previous note.  ALLERGIES:  is allergic to temazepam.  MEDICATIONS:  Current Outpatient Prescriptions  Medication Sig Dispense Refill  .  acyclovir ointment (ZOVIRAX) 5 % Apply 1 application topically every 3 (three) hours. 15 g 0  . albuterol (PROVENTIL HFA;VENTOLIN HFA) 108 (90 Base) MCG/ACT inhaler Inhale 2 puffs into the lungs every 4 (four) hours as needed for wheezing or shortness of breath. 1 Inhaler 0  . atazanavir (REYATAZ) 300 MG capsule Take 1 capsule (300 mg total) by mouth daily with breakfast. Take with Norvir. 30 capsule 5  . cholecalciferol (VITAMIN D) 1000 units tablet Take 1 tablet (1,000 Units total) by mouth daily. 30 tablet 9  . emtricitabine-tenofovir (TRUVADA) 200-300 MG tablet Take 1 tablet by mouth daily. 30 tablet 5  . ritonavir (NORVIR) 100 MG TABS tablet TAKE 1 TABLET BY MOUTH EVERY MORNING WITH BREAKFAST. TAKE WITH REYATAZ 30 tablet 5  . tetrahydrozoline 0.05 % ophthalmic solution Place 1 drop into both eyes 4 (four) times daily as needed (for eye irritation).    . traMADol (ULTRAM) 50 MG tablet Take 1 tablet (50 mg total) by mouth every 6 (six) hours as needed. 90 tablet 0   No current facility-administered medications for this visit.     PHYSICAL EXAMINATION: ECOG PERFORMANCE STATUS: 0 - Asymptomatic GENERAL:alert, no distress and comfortable SKIN: skin color, texture, turgor are normal, no rashes or significant lesions EYES: normal, Conjunctiva are pink and non-injected, sclera clear OROPHARYNX:no exudate, no erythema and lips, buccal mucosa, and tongue normal  NECK: supple, thyroid normal size, non-tender, without nodularity LYMPH:  no palpable lymphadenopathy in the cervical, axillary or inguinal LUNGS: clear to auscultation and percussion with normal breathing effort HEART: regular rate & rhythm and no murmurs and no lower extremity edema ABDOMEN:abdomen soft, non-tender and normal bowel sounds Musculoskeletal:no cyanosis of digits and no clubbing  NEURO: alert & oriented x 3 with fluent speech, no focal motor/sensory deficits  LABORATORY DATA:  I have reviewed the data as listed     Component Value Date/Time   NA 138 11/04/2016 0816   K 4.0 11/04/2016 0816   CL 101 07/02/2016 1420   CO2 23 11/04/2016 0816   GLUCOSE 92 11/04/2016 0816   BUN 9.4 11/04/2016 0816   CREATININE 0.9 11/04/2016 0816   CALCIUM 9.6 11/04/2016 0816   PROT 7.5 11/04/2016 0816   ALBUMIN 4.2 11/04/2016 0816   AST 19 11/04/2016 0816   ALT 21 11/04/2016 0816   ALKPHOS 78 11/04/2016 0816   BILITOT 2.41 (H) 11/04/2016 0816   GFRNONAA >60 07/02/2016 1420   GFRNONAA 83 02/20/2016 0908   GFRAA >60 07/02/2016 1420   GFRAA >89 02/20/2016 0908    No results found for: SPEP, UPEP  Lab Results  Component Value Date   WBC 7.2 11/04/2016   NEUTROABS 4.6 11/04/2016   HGB 15.3 11/04/2016   HCT 44.4 11/04/2016   MCV 99.7 11/04/2016   PLT 210 11/04/2016      Chemistry      Component Value Date/Time   NA 138 11/04/2016 0816   K 4.0 11/04/2016 0816   CL 101 07/02/2016 1420   CO2 23 11/04/2016 0816   BUN  9.4 11/04/2016 0816   CREATININE 0.9 11/04/2016 0816      Component Value Date/Time   CALCIUM 9.6 11/04/2016 0816   ALKPHOS 78 11/04/2016 0816   AST 19 11/04/2016 0816   ALT 21 11/04/2016 0816   BILITOT 2.41 (H) 11/04/2016 0816       RADIOGRAPHIC STUDIES: I have personally reviewed the radiological images as listed and agreed with the findings in the report. Nm Pet Image Restag (ps) Skull Base To Thigh  Result Date: 10/12/2016 CLINICAL DATA:  Subsequent treatment strategy for Hodgkin's lymphoma. EXAM: NUCLEAR MEDICINE PET SKULL BASE TO THIGH TECHNIQUE: 5.35 mCi F-18 FDG was injected intravenously. Full-ring PET imaging was performed from the skull base to thigh after the radiotracer. CT data was obtained and used for attenuation correction and anatomic localization. FASTING BLOOD GLUCOSE:  Value: 94 mg/dl COMPARISON:  PET-CT dated 07/07/2016 FINDINGS: NECK No hypermetabolic lymph nodes in the neck. CHEST Prevascular lymphadenopathy, measuring up to 13 mm short axis (series 4/image 9),  max SUV 4.3 (Deauville 4). This is new/recurrent from recent PET-CT. No suspicious pulmonary nodules. Right chest port, terminating at the cavoatrial junction. ABDOMEN/PELVIS No abnormal hypermetabolic activity within the liver, pancreas, adrenal glands, or spleen. Spleen is normal in size. Background liver hypermetabolism max SUV 2.1. Background spleen hypermetabolism max SUV 2.0. No hypermetabolic lymph nodes in the abdomen or pelvis. Hypermetabolism along the endometrium, new, max SUV 7.2. It is unclear whether this is reactive/physiologic. 3.2 cm benign right ovarian dermoid (series 4/image 162). SKELETON Hypermetabolic lesion along the L3 vertebral body and left posterior elements, max SUV 7.8 (Deauville 5). Hypermetabolic lesion along the left inferior pubic ramus, max SUV 4.9 (Deauville 4). IMPRESSION: Prevascular lymphadenopathy, reflecting nodal recurrence (Deauville 4). Hypermetabolic osseous metastases involving the L3 vertebral body/posterior elements and left inferior pubic ramus (Deauville 4-5). Hypermetabolism along the endometrium, new, possibly reactive/physiologic. Consider pelvic ultrasound and/or endometrial sampling as clinically warranted. Electronically Signed   By: Julian Hy M.D.   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) She feels well with no recent changes since our last discussion We will proceed with treatment She will get Lupron injection to suppress ovarian function I recommend minimum 3 doses of chemotherapy before repeat imaging Since I will be away, we will delay second cycle by 1 week  Human immunodeficiency virus (HIV) disease ([place]) Her last HIV viral load was undetectable. She will continue anti-retroviral treatment  Chronic pain of right wrist She has chronic wrist pain and diffuse musculoskeletal painof unknown etiology. Examination is benign. I recommend vitamin D supplement. I refilled her prescription  tramadol today.  Cigarette smoker I spent some time counseling the patient the importance of tobacco cessation. she is currently attempting to quit on her own    Elevated bilirubin She has recurrent elevated liver enzymes with elevated total bilirubin Liver parenchyma on PET CT scan is not abnormal Other liver enzymes adequate I will proceed with full dose treatment without dose adjustment   No orders of the defined types were placed in this encounter.  All questions were answered. The patient knows to call the clinic with any problems, questions or concerns. No barriers to learning was detected. I spent 15 minutes counseling the patient face to face. The total time spent in the appointment was 20 minutes and more than 50% was on counseling and review of test results     Heath Lark, MD 11/06/2016 3:30 PM

## 2016-11-06 NOTE — Assessment & Plan Note (Signed)
She has recurrent elevated liver enzymes with elevated total bilirubin Liver parenchyma on PET CT scan is not abnormal Other liver enzymes adequate I will proceed with full dose treatment without dose adjustment

## 2016-11-06 NOTE — Assessment & Plan Note (Signed)
She has chronic wrist pain and diffuse musculoskeletal pain of unknown etiology. Examination is benign. I recommend vitamin D supplement. I refilled her prescription tramadol today. 

## 2016-11-11 ENCOUNTER — Telehealth: Payer: Self-pay

## 2016-11-11 ENCOUNTER — Encounter (HOSPITAL_COMMUNITY): Payer: Self-pay | Admitting: Emergency Medicine

## 2016-11-11 ENCOUNTER — Emergency Department (HOSPITAL_COMMUNITY)
Admission: EM | Admit: 2016-11-11 | Discharge: 2016-11-11 | Disposition: A | Discharge status: Home / Self Care | Payer: Medicare Other | Attending: Emergency Medicine | Admitting: Emergency Medicine

## 2016-11-11 DIAGNOSIS — C8580 Other specified types of non-Hodgkin lymphoma, unspecified site: Secondary | ICD-10-CM | POA: Insufficient documentation

## 2016-11-11 DIAGNOSIS — B20 Human immunodeficiency virus [HIV] disease: Secondary | ICD-10-CM | POA: Diagnosis not present

## 2016-11-11 DIAGNOSIS — R102 Pelvic and perineal pain: Secondary | ICD-10-CM | POA: Diagnosis not present

## 2016-11-11 DIAGNOSIS — N939 Abnormal uterine and vaginal bleeding, unspecified: Secondary | ICD-10-CM | POA: Insufficient documentation
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
## 2016-11-11 NOTE — ED Triage Notes (Signed)
Pt complaint of vaginal pain, itching, and bleeding noted yesterday. Pt denies urinary symptoms or vaginal discharge.

## 2016-11-11 NOTE — ED Notes (Signed)
Pt decided to leave when setting up pelvic cart

## 2016-11-11 NOTE — Telephone Encounter (Signed)
Patient walked into lobby complaining of vaginal bleeding and pain in vagina. Went to see patient in lobby, she said she went to ED at [facility] and left without being seen today. Instructed patient per Dr. Alvy Bimler, to go back to ED or go to [facility] hospital for complaints. Patient states that she is having a small amount bleeding and just started using a pad today.

## 2016-12-02 ENCOUNTER — Ambulatory Visit (HOSPITAL_BASED_OUTPATIENT_CLINIC_OR_DEPARTMENT_OTHER): Payer: Medicare Other

## 2016-12-02 ENCOUNTER — Ambulatory Visit (HOSPITAL_BASED_OUTPATIENT_CLINIC_OR_DEPARTMENT_OTHER): Payer: Medicare Other | Admitting: Hematology and Oncology

## 2016-12-02 ENCOUNTER — Encounter: Payer: Self-pay | Admitting: Hematology and Oncology

## 2016-12-02 ENCOUNTER — Ambulatory Visit: Payer: Medicare Other

## 2016-12-02 ENCOUNTER — Other Ambulatory Visit: Payer: Self-pay | Admitting: Hematology and Oncology

## 2016-12-02 ENCOUNTER — Other Ambulatory Visit (HOSPITAL_COMMUNITY)
Admission: AD | Admit: 2016-12-02 | Discharge: 2016-12-02 | Disposition: A | Discharge status: Home / Self Care | Payer: Medicare Other | Source: Ambulatory Visit | Attending: Hematology and Oncology | Admitting: Hematology and Oncology

## 2016-12-02 ENCOUNTER — Telehealth: Payer: Self-pay | Admitting: Hematology and Oncology

## 2016-12-02 VITALS — BP 104/66 | HR 73 | Temp 98.1°F | Resp 20 | Ht 64.0 in | Wt 104.0 lb

## 2016-12-02 DIAGNOSIS — C8118 Nodular sclerosis classical Hodgkin lymphoma, lymph nodes of multiple sites: Secondary | ICD-10-CM | POA: Insufficient documentation

## 2016-12-02 DIAGNOSIS — M25531 Pain in right wrist: Secondary | ICD-10-CM | POA: Diagnosis not present

## 2016-12-02 DIAGNOSIS — R300 Dysuria: Secondary | ICD-10-CM | POA: Insufficient documentation

## 2016-12-02 DIAGNOSIS — E43 Unspecified severe protein-calorie malnutrition: Secondary | ICD-10-CM

## 2016-12-02 DIAGNOSIS — B20 Human immunodeficiency virus [HIV] disease: Secondary | ICD-10-CM

## 2016-12-02 DIAGNOSIS — Z95828 Presence of other vascular implants and grafts: Secondary | ICD-10-CM

## 2016-12-02 DIAGNOSIS — G8929 Other chronic pain: Secondary | ICD-10-CM

## 2016-12-02 DIAGNOSIS — Z5111 Encounter for antineoplastic chemotherapy: Secondary | ICD-10-CM

## 2016-12-02 DIAGNOSIS — Z5112 Encounter for antineoplastic immunotherapy: Secondary | ICD-10-CM

## 2016-12-02 DIAGNOSIS — R17 Unspecified jaundice: Secondary | ICD-10-CM

## 2016-12-02 DIAGNOSIS — M791 Myalgia: Secondary | ICD-10-CM

## 2016-12-02 LAB — COMPREHENSIVE METABOLIC PANEL
ALBUMIN: 4.1 g/dL (ref 3.5–5.0)
ALK PHOS: 79 U/L (ref 40–150)
ALT: 18 U/L (ref 0–55)
AST: 21 U/L (ref 5–34)
Anion Gap: 10 mEq/L (ref 3–11)
BILIRUBIN TOTAL: 3.25 mg/dL — AB (ref 0.20–1.20)
BUN: 8.3 mg/dL (ref 7.0–26.0)
CO2: 24 meq/L (ref 22–29)
Calcium: 9.9 mg/dL (ref 8.4–10.4)
Chloride: 106 mEq/L (ref 98–109)
Creatinine: 0.9 mg/dL (ref 0.6–1.1)
EGFR: >90 mL/min/{1.73_m2} (ref >90)
GLUCOSE: 94 mg/dL (ref 70–140)
POTASSIUM: 3.9 meq/L (ref 3.5–5.1)
SODIUM: 140 meq/L (ref 136–145)
TOTAL PROTEIN: 7.4 g/dL (ref 6.4–8.3)

## 2016-12-02 LAB — URINALYSIS, MICROSCOPIC - CHCC
Bilirubin (Urine): NEGATIVE
Blood: NEGATIVE
GLUCOSE UR CHCC: NEGATIVE mg/dL
KETONES: NEGATIVE mg/dL
Leukocyte Esterase: NEGATIVE
Nitrite: NEGATIVE
PH: 6 (ref 4.6–8.0)
PROTEIN: NEGATIVE mg/dL
SPECIFIC GRAVITY, URINE: 1.005 (ref 1.003–1.035)
Urobilinogen, UR: 0.2 mg/dL (ref 0.2–1)

## 2016-12-02 LAB — CBC WITH DIFFERENTIAL/PLATELET
BASO%: 0.6 % (ref 0.0–2.0)
BASOS ABS: 0 10*3/uL (ref 0.0–0.1)
EOS%: 0.6 % (ref 0.0–7.0)
Eosinophils Absolute: 0 10*3/uL (ref 0.0–0.5)
HCT: 43.5 % (ref 34.8–46.6)
HGB: 15.1 g/dL (ref 11.6–15.9)
LYMPH%: 20.4 % (ref 14.0–49.7)
MCH: 34.2 pg — AB (ref 25.1–34.0)
MCHC: 34.7 g/dL (ref 31.5–36.0)
MCV: 98.4 fL (ref 79.5–101.0)
MONO#: 0.5 10*3/uL (ref 0.1–0.9)
MONO%: 5.8 % (ref 0.0–14.0)
NEUT#: 5.7 10*3/uL (ref 1.5–6.5)
NEUT%: 72.6 % (ref 38.4–76.8)
PLATELETS: 214 10*3/uL (ref 145–400)
RBC: 4.43 10*6/uL (ref 3.70–5.45)
RDW: 14.3 % (ref 11.2–14.5)
WBC: 7.8 10*3/uL (ref 3.9–10.3)
lymph#: 1.6 10*3/uL (ref 0.9–3.3)

## 2016-12-02 LAB — PREGNANCY, URINE: Preg Test, Ur: NEGATIVE

## 2016-12-02 MED ORDER — LEUPROLIDE ACETATE 7.5 MG IM KIT
7.5000 mg | PACK | Freq: Once | INTRAMUSCULAR | Status: AC
  Administered 2016-12-02: 7.5 mg via INTRAMUSCULAR
  Filled 2016-12-02: qty 7.5

## 2016-12-02 MED ORDER — DEXAMETHASONE SODIUM PHOSPHATE 10 MG/ML IJ SOLN
10.0000 mg | Freq: Once | INTRAMUSCULAR | Status: AC
  Administered 2016-12-02: 10 mg via INTRAVENOUS

## 2016-12-02 MED ORDER — HEPARIN SOD (PORK) LOCK FLUSH 100 UNIT/ML IV SOLN
500.0000 [IU] | Freq: Once | INTRAVENOUS | Status: AC | PRN
  Administered 2016-12-02: 500 [IU]
  Filled 2016-12-02: qty 5

## 2016-12-02 MED ORDER — DIPHENHYDRAMINE HCL 50 MG/ML IJ SOLN
INTRAMUSCULAR | Status: AC
  Filled 2016-12-02: qty 1

## 2016-12-02 MED ORDER — DEXAMETHASONE SODIUM PHOSPHATE 10 MG/ML IJ SOLN
INTRAMUSCULAR | Status: AC
  Filled 2016-12-02: qty 1

## 2016-12-02 MED ORDER — ACETAMINOPHEN 325 MG PO TABS
ORAL_TABLET | ORAL | Status: AC
  Filled 2016-12-02: qty 2

## 2016-12-02 MED ORDER — ACETAMINOPHEN 325 MG PO TABS
650.0000 mg | ORAL_TABLET | Freq: Once | ORAL | Status: AC
  Administered 2016-12-02: 650 mg via ORAL

## 2016-12-02 MED ORDER — DIPHENHYDRAMINE HCL 50 MG/ML IJ SOLN
50.0000 mg | Freq: Once | INTRAMUSCULAR | Status: AC
Start: 2016-12-02 — End: 2016-12-02
  Administered 2016-12-02: 50 mg via INTRAVENOUS

## 2016-12-02 MED ORDER — TRAMADOL HCL 50 MG PO TABS: 50.0000 mg | ORAL_TABLET | Freq: Four times a day (QID) | ORAL | 0 refills | Status: DC | PRN

## 2016-12-02 MED ORDER — SODIUM CHLORIDE 0.9 % IV SOLN
Freq: Once | INTRAVENOUS | Status: AC
  Administered 2016-12-02: 09:00:00 via INTRAVENOUS

## 2016-12-02 MED ORDER — SODIUM CHLORIDE 0.9% FLUSH
10.0000 mL | INTRAVENOUS | Status: DC | PRN
  Administered 2016-12-02: 10 mL
  Filled 2016-12-02: qty 10

## 2016-12-02 MED ORDER — BRENTUXIMAB VEDOTIN 50 MG IV SOLR
1.8000 mg/kg | Freq: Once | INTRAVENOUS | Status: AC
  Administered 2016-12-02: 90 mg via INTRAVENOUS
  Filled 2016-12-02: qty 18

## 2016-12-02 MED ORDER — SODIUM CHLORIDE 0.9% FLUSH
10.0000 mL | INTRAVENOUS | Status: DC | PRN
  Administered 2016-12-02: 10 mL via INTRAVENOUS
  Filled 2016-12-02: qty 10

## 2016-12-02 NOTE — Assessment & Plan Note (Signed)
She has chronic wrist pain and diffuse musculoskeletal pain of unknown etiology. Examination is benign. I recommend vitamin D supplement. I refilled her prescription tramadol today. 

## 2016-12-02 NOTE — Assessment & Plan Note (Signed)
She has recurrent elevated liver enzymes with elevated total bilirubin Liver parenchyma on PET CT scan is not abnormal Other liver enzymes adequate I will proceed with full dose treatment without dose adjustment

## 2016-12-02 NOTE — Progress Notes (Signed)
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      05/07/2014 Imaging    CT scan of the chest show right thoracic inlet lymphadenopathy      06/07/2014 Procedure    She underwent ultrasound-guided core biopsy of the neck lymph node      06/07/2014 Pathology Results    Accession: [id number] biopsy confirmed diagnosis of Hodgkin lymphoma.      06/15/2014 Imaging    Echocardiogram showed preserved ejection fraction      07/09/2014 - 07/12/2014 Hospital Admission    She was admitted to the hospital for severe anemia.      07/27/2014 Procedure    She had placement of port      07/31/2014 - 09/11/2014 Chemotherapy    She received dose adjusted chemotherapy due to abnormal liver function tests and severe anemia. Treatment was delayed due to noncompliance  and subsequently stopped because the patient failed to keep appointments      01/11/2015 Imaging    Repeat PET CT scan showed response to treatment      01/28/2015 - 06/18/2015 Chemotherapy    ABVD was restarted with full dose.      02/08/2015 - 02/10/2015 Hospital Admission    The patient was admitted to the hospital due to pancytopenia and profuse diarrhea. Cultures were negative. She was placed on ciprofloxacin.      02/11/2015 Adverse Reaction    Treatment was placed on hold due to recent infection.      04/11/2015 Imaging    PET CT scan showed near complete response. Incidental finding of an abnormal bone  lesion, indeterminate. She is not symptomatic. Recommendation from [facility] to observe      07/11/2015 Imaging    PET CT scan showed abnormal new bone lesions, suggestive of possible disease progression      07/23/2015 Bone Marrow Biopsy    She underwent bone biopsy      07/23/2015 Pathology Results    Accession: [id number]  biopsy was negative for cancer      11/21/2015 Surgery    She had surgery for ectopic pregnancy      01/29/2016 Imaging    Ct chest, abdomen and pelvis showed pelvic and retroperitoneal lymphadenopathy, as above, concerning for residual disease. There is also a mildly enlarged posterior mediastinal lymph node measuring 11 mm adjacent to the distal descending thoracic aorta. This may represent an additional focus of disease, but is the only finding of concern in the thorax on today's examination. Sclerosis in the right ilium at site of previously noted metabolically active lesion, grossly unchanged. No other definite osseous lesions are identified on today's examination. Spleen is normal in size and appearance.      02/14/2016 PET scan    Interval disease worsening with new foci of hypermetabolic activity in multiple retroperitoneal and pelvic lymph nodes as well as AP window and left hilar lymph nodes. (Deauville 5). There is also overall worsening of the osseous disease.      05/11/2016 Pathology Results  Diagnosis Lymph node, needle/core biopsy, Left para-aortic retroperitoneal - CLASSICAL HODGKIN LYMPHOMA. - SEE ONCOLOGY TABLE. Microscopic Comment LYMPHOMA Histologic type: Classical Hodgkin lymphoma. Grade (if applicable): N/A Flow cytometry: Not done. Immunohistochemical stains: CD15, CD20, CD3, LCA, PAX-5, CD30 with appropriate controls. Touch preps/imprints: Not performed. Comments: The sections show small needle core biopsy fragments displaying a polymorphous cellular proliferation of small lymphocytes, plasma cells, eosinophils, and large atypical  mononuclear and multilobated lymphoid cells with features of Reed-Sternberg cells and variants. This is associated with patchy areas of fibrosis. Immunohistochemical stains were performed and show that the large atypical lymphoid cells are positive for CD30, CD15 and PAX-5 and negative for LCA, CD20, CD3. The small lymphoid cells in the background show a mixture of T and B cells with predominance of T cells. The overall morphologic and histologic features are consistent with classical Hodgkin lymphoma. Further subtyping is challenging in limited small biopsy fragments but the patchy fibrosis suggests nodular sclerosis type.       05/11/2016 Procedure    She underwent CT guided biopsy of retroperitoneal lymph node      05/26/2016 Procedure    Successful placement of a right IJ approach Power Port with ultrasound and fluoroscopic guidance. The catheter is ready for use.      06/02/2016 PET scan    Mixed response to chemotherapy with some lymph nodes decreased in metabolic activity and some lymph nodes increase metabolic activity. Lymph node stations including mediastinum, periaortic retroperitoneum, and obturator node stations. Activity is remains relatively intense Deauville 4 2. LEFT infrahilar nodule / lymph node with intense metabolic activity decreased from prior. ( Deauville 4 ). 3. 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 Deauville 5      06/03/2016 - 06/05/2016 Hospital Admission    She was admitted to the hospital for cycle 1 of ICE chemotherapy      06/24/2016 - 06/26/2016 Hospital Admission    She received cycle 2 of ICE chemo      07/07/2016 PET scan    Resolution of prior hypermetabolic adenopathy and resolution of prior osseous foci of hypermetabolic activity compatible with essentially complete response to therapy (Deauville 1). 2. Generalized reduced activity in the L4 vertebral body and the type of finding which would typically reflect prior radiation  therapy. 3. Stable septated fatty right pelvic lesion, possibly a dermoid or lipoma, not hypermetabolic      1/32/4401 PET scan    Hypermetabolic lesion along the L3 vertebral body and left posterior elements, max SUV 7.8 (Deauville 5). Hypermetabolic lesion along the left inferior pubic ramus, max SUV 4.9 (Deauville 4).  IMPRESSION: Prevascular lymphadenopathy, reflecting nodal recurrence (Deauville 4). Hypermetabolic osseous metastases involving the L3 vertebral body/posterior elements and left inferior pubic ramus (Deauville 4-5). Hypermetabolism along the endometrium, new, possibly reactive/physiologic. Consider pelvic ultrasound and/or endometrial sampling as clinically warranted.      11/04/2016 -  Chemotherapy    She received Brentuximab       INTERVAL HISTORY: Please see below for problem oriented charting. She returns for cycle 2 of treatment She has progressive weight loss and abdominal pain that comes and goes She denies recent infection She had frequent night sweats She denies significant new bone pain but continued to have mild musculoskeletal pain requiring tramadol  REVIEW OF SYSTEMS:   Eyes: Denies blurriness of vision Ears, nose, mouth, throat, and face: Denies mucositis or sore throat Respiratory: Denies cough, dyspnea or wheezes Cardiovascular: Denies palpitation, chest discomfort or lower extremity swelling Gastrointestinal:  Denies nausea, heartburn or change in bowel habits Skin: Denies abnormal skin rashes Lymphatics: Denies new lymphadenopathy or easy bruising Neurological:Denies numbness, tingling or new weaknesses Behavioral/Psych: Mood is stable, no new changes  All other systems were reviewed with the patient and are negative.  I have reviewed the past medical history, past surgical history, social history and family history with the patient and they are unchanged from previous note.  ALLERGIES:  is allergic to temazepam.  MEDICATIONS:  Current  Outpatient Prescriptions  Medication Sig Dispense Refill  . acyclovir ointment (ZOVIRAX) 5 % Apply 1 application topically every 3 (three) hours. 15 g 0  . albuterol (PROVENTIL HFA;VENTOLIN HFA) 108 (90 Base) MCG/ACT inhaler Inhale 2 puffs into the lungs every 4 (four) hours as needed for wheezing or shortness of breath. (Patient not taking: Reported on 11/11/2016) 1 Inhaler 0  . amoxicillin-clavulanate (AUGMENTIN) 875-125 MG tablet Take 1 tablet by mouth 2 (two) times daily. Pt started dose on Monday 7/16 for 5 day treatment    . atazanavir (REYATAZ) 300 MG capsule Take 1 capsule (300 mg total) by mouth daily with breakfast. Take with Norvir. 30 capsule 5  . cholecalciferol (VITAMIN D) 1000 units tablet Take 1 tablet (1,000 Units total) by mouth daily. (Patient not taking: Reported on 11/11/2016) 30 tablet 9  . emtricitabine-tenofovir (TRUVADA) 200-300 MG tablet Take 1 tablet by mouth daily. 30 tablet 5  . ritonavir (NORVIR) 100 MG TABS tablet TAKE 1 TABLET BY MOUTH EVERY MORNING WITH BREAKFAST. TAKE WITH REYATAZ 30 tablet 5  . tetrahydrozoline 0.05 % ophthalmic solution Place 2 drops into both eyes 2 (two) times daily as needed (for eye irritation).     . traMADol (ULTRAM) 50 MG tablet Take 1 tablet (50 mg total) by mouth every 6 (six) hours as needed. 90 tablet 0   No current facility-administered medications for this visit.    Facility-Administered Medications Ordered in Other Visits  Medication Dose Route Frequency Provider Last Rate Last Dose  . brentuximab vedotin (ADCETRIS) 90 mg in sodium chloride 0.9 % 100 mL chemo infusion  1.8 mg/kg (Treatment Plan Recorded) Intravenous Once Alvy Bimler, Nicolaas Savo, MD      . heparin lock flush 100 unit/mL  500 Units Intracatheter Once PRN Alvy Bimler, Eri Platten, MD      . sodium chloride flush (NS) 0.9 % injection 10 mL  10 mL Intracatheter PRN Alvy Bimler, Wayde Gopaul, MD        PHYSICAL EXAMINATION: ECOG PERFORMANCE STATUS: 1 - Symptomatic but completely ambulatory  Vitals:    12/02/16 0900  BP: 104/66  Pulse: 73  Resp: 20  Temp: 98.1 F (36.7 C)   Filed Weights   12/02/16 0900  Weight: 104 lb (47.2 kg)    GENERAL:alert, no distress and comfortable she looks thin and cachectic SKIN: skin color, texture, turgor are normal, no rashes or significant lesions EYES: normal, Conjunctiva are pink and non-injected, sclera clear OROPHARYNX:no exudate, no erythema and lips, buccal mucosa, and tongue normal  NECK: supple, thyroid normal size, non-tender, without nodularity LYMPH:  no palpable lymphadenopathy in the cervical, axillary or inguinal LUNGS: clear to auscultation and percussion with normal breathing effort HEART: regular rate & rhythm and no murmurs and no lower extremity edema ABDOMEN:abdomen soft, non-tender and normal bowel sounds Musculoskeletal:no cyanosis of digits and no clubbing  NEURO: alert & oriented x 3 with fluent speech, no focal motor/sensory deficits  LABORATORY DATA:  I have reviewed the data as listed    Component Value Date/Time   NA  140 12/02/2016 0841   K 3.9 12/02/2016 0841   CL 101 07/02/2016 1420   CO2 24 12/02/2016 0841   GLUCOSE 94 12/02/2016 0841   BUN 8.3 12/02/2016 0841   CREATININE 0.9 12/02/2016 0841   CALCIUM 9.9 12/02/2016 0841   PROT 7.4 12/02/2016 0841   ALBUMIN 4.1 12/02/2016 0841   AST 21 12/02/2016 0841   ALT 18 12/02/2016 0841   ALKPHOS 79 12/02/2016 0841   BILITOT 3.25 (H) 12/02/2016 0841   GFRNONAA >60 07/02/2016 1420   GFRNONAA 83 02/20/2016 0908   GFRAA >60 07/02/2016 1420   GFRAA >89 02/20/2016 0908    No results found for: SPEP, UPEP  Lab Results  Component Value Date   WBC 7.8 12/02/2016   NEUTROABS 5.7 12/02/2016   HGB 15.1 12/02/2016   HCT 43.5 12/02/2016   MCV 98.4 12/02/2016   PLT 214 12/02/2016      Chemistry      Component Value Date/Time   NA 140 12/02/2016 0841   K 3.9 12/02/2016 0841   CL 101 07/02/2016 1420   CO2 24 12/02/2016 0841   BUN 8.3 12/02/2016 0841    CREATININE 0.9 12/02/2016 0841      Component Value Date/Time   CALCIUM 9.9 12/02/2016 0841   ALKPHOS 79 12/02/2016 0841   AST 21 12/02/2016 0841   ALT 18 12/02/2016 0841   BILITOT 3.25 (H) 12/02/2016 0841       ASSESSMENT & PLAN:  Hodgkin lymphoma, 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 Examination is benign. I recommend vitamin D supplement. I refilled her prescription tramadol today.  Protein-calorie malnutrition, severe ([place]) She has progressive weight loss and is now cachectic I will order imaging study to exclude disease progression    Orders Placed This Encounter  Procedures  . Urine Culture    Standing Status:   Future    Standing Expiration Date:   01/06/2018  . NM PET Image Restag (PS) Skull Base To Thigh    Standing Status:   Future    Standing Expiration Date:   12/02/2017    Order Specific Question:   If indicated for the ordered procedure, I authorize the administration of a radiopharmaceutical per Radiology protocol    Answer:   Yes    Order Specific Question:   Is the patient pregnant?    Answer:   No    Order Specific Question:   Preferred imaging location?    Answer:   [facility]    Order Specific Question:   Radiology Contrast Protocol - do NOT remove file path    Answer:   [email or web address]  . Urinalysis, Microscopic - [facility]    Standing Status:   Future    Standing Expiration Date:   01/06/2018   All  questions were answered. The patient knows to call the clinic with any problems, questions or concerns. No barriers to learning was detected. I spent 20 minutes counseling the patient face to face. The total time spent in the appointment was  25 minutes and more than 50% was on counseling and review of test results     Heath Lark, MD 12/02/2016 9:53 AM

## 2016-12-02 NOTE — Assessment & Plan Note (Signed)
She has progressive weight loss and is now cachectic I will order imaging study to exclude disease progression

## 2016-12-02 NOTE — Patient Instructions (Signed)
Leuprolide depot injection What is this medicine? LEUPROLIDE (loo PROE lide) is a man-made protein that acts like a natural hormone in the body. It decreases testosterone in men and decreases estrogen in women. In men, this medicine is used to treat advanced prostate cancer. In women, some forms of this medicine may be used to treat endometriosis, uterine fibroids, or other female hormone-related problems. This medicine may be used for other purposes; ask your health care provider or pharmacist if you have questions. COMMON BRAND NAME(S): Eligard, Lupron Depot, Lupron Depot-Ped, Viadur What should I tell my health care provider before I take this medicine? They need to know if you have any of these conditions: -diabetes -heart disease or previous heart attack -high blood pressure -high cholesterol -mental illness -osteoporosis -pain or difficulty passing urine -seizures -spinal cord metastasis -stroke -suicidal thoughts, plans, or attempt; a previous suicide attempt by you or a family member -tobacco smoker -unusual vaginal bleeding (women) -an unusual or allergic reaction to leuprolide, benzyl alcohol, other medicines, foods, dyes, or preservatives -pregnant or trying to get pregnant -breast-feeding How should I use this medicine? This medicine is for injection into a muscle or for injection under the skin. It is given by a health care professional in a hospital or clinic setting. The specific product will determine how it will be given to you. Make sure you understand which product you receive and how often you will receive it. Talk to your pediatrician regarding the use of this medicine in children. Special care may be needed. Overdosage: If you think you have taken too much of this medicine contact a poison control center or emergency room at once. NOTE: This medicine is only for you. Do not share this medicine with others. What if I miss a dose? It is important not to miss a dose.  Call your doctor or health care professional if you are unable to keep an appointment. Depot injections: Depot injections are given either once-monthly, every 12 weeks, every 16 weeks, or every 24 weeks depending on the product you are prescribed. The product you are prescribed will be based on if you are female or female, and your condition. Make sure you understand your product and dosing. What may interact with this medicine? Do not take this medicine with any of the following medications: -chasteberry This medicine may also interact with the following medications: -herbal or dietary supplements, like black cohosh or DHEA -female hormones, like estrogens or progestins and birth control pills, patches, rings, or injections -female hormones, like testosterone This list may not describe all possible interactions. Give your health care provider a list of all the medicines, herbs, non-prescription drugs, or dietary supplements you use. Also tell them if you smoke, drink alcohol, or use illegal drugs. Some items may interact with your medicine. What should I watch for while using this medicine? Visit your doctor or health care professional for regular checks on your progress. During the first weeks of treatment, your symptoms may get worse, but then will improve as you continue your treatment. You may get hot flashes, increased bone pain, increased difficulty passing urine, or an aggravation of nerve symptoms. Discuss these effects with your doctor or health care professional, some of them may improve with continued use of this medicine. Female patients may experience a menstrual cycle or spotting during the first months of therapy with this medicine. If this continues, contact your doctor or health care professional. What side effects may I notice from receiving this medicine? Side   effects that you should report to your doctor or health care professional as soon as possible: -allergic reactions like skin  rash, itching or hives, swelling of the face, lips, or tongue -breathing problems -chest pain -depression or memory disorders -pain in your legs or groin -pain at site where injected or implanted -severe headache -swelling of the feet and legs -visual changes -vomiting Side effects that usually do not require medical attention (report to your doctor or health care professional if they continue or are bothersome): -breast swelling or tenderness -decrease in sex drive or performance -diarrhea -hot flashes -loss of appetite -muscle, joint, or bone pains -nausea -redness or irritation at site where injected or implanted -skin problems or acne This list may not describe all possible side effects. Call your doctor for medical advice about side effects. You may report side effects to FDA at 1-800-FDA-1088. Where should I keep my medicine? This drug is given in a hospital or clinic and will not be stored at home. NOTE: This sheet is a summary. It may not cover all possible information. If you have questions about this medicine, talk to your doctor, pharmacist, or health care provider.  2017 [employer] (2015-09-26 09:45:17)  

## 2016-12-02 NOTE — Assessment & Plan Note (Signed)
She tolerated treatment well but I am concerned about progressive weight loss I would proceed with treatment today without delay I plan to order PET CT scan next week to reassess response to Rx

## 2016-12-02 NOTE — Patient Instructions (Signed)
[facility] Discharge Instructions for Patients Receiving Chemotherapy  Today you received the following chemotherapy agents: Brentuximab   To help prevent nausea and vomiting after your treatment, we encourage you to take your nausea medication as prescribed. If you develop nausea and vomiting that is not controlled by your nausea medication, call the clinic.   BELOW ARE SYMPTOMS THAT SHOULD BE REPORTED IMMEDIATELY:  *FEVER GREATER THAN 100.5 F  *CHILLS WITH OR WITHOUT FEVER  NAUSEA AND VOMITING THAT IS NOT CONTROLLED WITH YOUR NAUSEA MEDICATION  *UNUSUAL SHORTNESS OF BREATH  *UNUSUAL BRUISING OR BLEEDING  TENDERNESS IN MOUTH AND THROAT WITH OR WITHOUT PRESENCE OF ULCERS  *URINARY PROBLEMS  *BOWEL PROBLEMS  UNUSUAL RASH Items with * indicate a potential emergency and should be followed up as soon as possible.  Feel free to call the clinic you have any questions or concerns. The clinic phone number is [phone].  Please show the [facility] at check-in to the Emergency Department and triage nurse.  Brentuximab vedotin solution for injection What is this medicine? BRENTUXIMAB VEDOTIN (bren TUX see mab ve DOE tin) is a monoclonal antibody and a chemotherapy drug. It is used for treating Hodgkin lymphoma and certain non-Hodgkin lymphomas, such as anaplastic large-cell lymphoma and mycosis fungoides. This medicine may be used for other purposes; ask your health care provider or pharmacist if you have questions. COMMON BRAND NAME(S): ADCETRIS What should I tell my health care provider before I take this medicine? They need to know if you have any of these conditions: -immune system problems -infection (especially a virus infection such as chickenpox, cold sores, or herpes) -kidney disease -liver disease -low blood counts, like low white cell, platelet, or red cell counts -tingling of the fingers or toes, or other nerve disorder -an unusual or allergic  reaction to brentuximab vedotin, other medicines, foods, dyes, or preservatives -pregnant or trying to get pregnant -breast-feeding How should I use this medicine? This medicine is for infusion into a vein. It is given by a health care professional in a hospital or clinic setting. Talk to your pediatrician regarding the use of this medicine in children. Special care may be needed. Overdosage: If you think you have taken too much of this medicine contact a poison control center or emergency room at once. NOTE: This medicine is only for you. Do not share this medicine with others. What if I miss a dose? It is important not to miss your dose. Call your doctor or health care professional if you are unable to keep an appointment. What may interact with this medicine? This medicine may interact with the following medications: -ketoconazole -rifampin -St. John's wort; Hypericum perforatum This list may not describe all possible interactions. Give your health care provider a list of all the medicines, herbs, non-prescription drugs, or dietary supplements you use. Also tell them if you smoke, drink alcohol, or use illegal drugs. Some items may interact with your medicine. What should I watch for while using this medicine? Visit your doctor for checks on your progress. This drug may make you feel generally unwell. Report any side effects. Continue your course of treatment even though you feel ill unless your doctor tells you to stop. Call your doctor or health care professional for advice if you get a fever, chills or sore throat, or other symptoms of a cold or flu. Do not treat yourself. This drug decreases your body's ability to fight infections. Try to avoid being around people who are  sick. This medicine may increase your risk to bruise or bleed. Call your doctor or health care professional if you notice any unusual bleeding. In some patients, this medicine may cause a serious brain infection that may  cause death. If you have any problems seeing, thinking, speaking, walking, or standing, tell your doctor right away. If you cannot reach your doctor, urgently seek other source of medical care. Do not become pregnant while taking this medicine or for 6 months after stopping it. Women should inform their doctor if they wish to become pregnant or think they might be pregnant. Men should not father a child while taking this medicine and for 6 months after stopping it. There is a potential for serious side effects to an unborn child. Talk to your health care professional or pharmacist for more information. Do not breast-feed an infant while taking this medicine. This may interfere with the ability to father a child. You should talk to your doctor or health care professional if you are concerned about your fertility. What side effects may I notice from receiving this medicine? Side effects that you should report to your doctor or health care professional as soon as possible: -allergic reactions like skin rash, itching or hives, swelling of the face, lips, or tongue -changes in emotions or moods -diarrhea -low blood counts - this medicine may decrease the number of white blood cells, red blood cells and platelets. You may be at increased risk for infections and bleeding. -pain, tingling, numbness in the hands or feet -redness, blistering, peeling or loosening of the skin, including inside the mouth -shortness of breath -signs of infection - fever or chills, cough, sore throat, pain or difficulty passing urine -signs of decreased platelets or bleeding - bruising, pinpoint red spots on the skin, black, tarry stools, blood in the urine -signs of decreased red blood cells - unusually weak or tired, fainting spells, lightheadedness -signs of liver injury like dark yellow or brown urine; general ill feeling or flu-like symptoms; light-colored stools; loss of appetite; nausea; right upper belly pain; yellowing of  the eyes or skin -stomach pain -sudden numbness or weakness of the face, arm or leg -vomiting Side effects that usually do not require medical attention (report to your doctor or health care professional if they continue or are bothersome): -dizziness -headache -muscle pain -tiredness This list may not describe all possible side effects. Call your doctor for medical advice about side effects. You may report side effects to FDA at 1-800-FDA-1088. Where should I keep my medicine? This drug is given in a hospital or clinic and will not be stored at home. NOTE: This sheet is a summary. It may not cover all possible information. If you have questions about this medicine, talk to your doctor, pharmacist, or health care provider.  2018 Elsevier/Gold Standard (2016-03-06 18:06:58) Leuprolide depot injection What is this medicine? LEUPROLIDE (loo PROE lide) is a man-made protein that acts like a natural hormone in the body. It decreases testosterone in men and decreases estrogen in women. In men, this medicine is used to treat advanced prostate cancer. In women, some forms of this medicine may be used to treat endometriosis, uterine fibroids, or other female hormone-related problems. This medicine may be used for other purposes; ask your health care provider or pharmacist if you have questions. COMMON BRAND NAME(S): Eligard, Lupron Depot, Lupron Depot-Ped, Viadur What should I tell my health care provider before I take this medicine? They need to know if you have any of  these conditions: -diabetes -heart disease or previous heart attack -high blood pressure -high cholesterol -mental illness -osteoporosis -pain or difficulty passing urine -seizures -spinal cord metastasis -stroke -suicidal thoughts, plans, or attempt; a previous suicide attempt by you or a family member -tobacco smoker -unusual vaginal bleeding (women) -an unusual or allergic reaction to leuprolide, benzyl alcohol, other  medicines, foods, dyes, or preservatives -pregnant or trying to get pregnant -breast-feeding How should I use this medicine? This medicine is for injection into a muscle or for injection under the skin. It is given by a health care professional in a hospital or clinic setting. The specific product will determine how it will be given to you. Make sure you understand which product you receive and how often you will receive it. Talk to your pediatrician regarding the use of this medicine in children. Special care may be needed. Overdosage: If you think you have taken too much of this medicine contact a poison control center or emergency room at once. NOTE: This medicine is only for you. Do not share this medicine with others. What if I miss a dose? It is important not to miss a dose. Call your doctor or health care professional if you are unable to keep an appointment. Depot injections: Depot injections are given either once-monthly, every 12 weeks, every 16 weeks, or every 24 weeks depending on the product you are prescribed. The product you are prescribed will be based on if you are female or female, and your condition. Make sure you understand your product and dosing. What may interact with this medicine? Do not take this medicine with any of the following medications: -chasteberry This medicine may also interact with the following medications: -herbal or dietary supplements, like black cohosh or DHEA -female hormones, like estrogens or progestins and birth control pills, patches, rings, or injections -female hormones, like testosterone This list may not describe all possible interactions. Give your health care provider a list of all the medicines, herbs, non-prescription drugs, or dietary supplements you use. Also tell them if you smoke, drink alcohol, or use illegal drugs. Some items may interact with your medicine. What should I watch for while using this medicine? Visit your doctor or health care  professional for regular checks on your progress. During the first weeks of treatment, your symptoms may get worse, but then will improve as you continue your treatment. You may get hot flashes, increased bone pain, increased difficulty passing urine, or an aggravation of nerve symptoms. Discuss these effects with your doctor or health care professional, some of them may improve with continued use of this medicine. Female patients may experience a menstrual cycle or spotting during the first months of therapy with this medicine. If this continues, contact your doctor or health care professional. What side effects may I notice from receiving this medicine? Side effects that you should report to your doctor or health care professional as soon as possible: -allergic reactions like skin rash, itching or hives, swelling of the face, lips, or tongue -breathing problems -chest pain -depression or memory disorders -pain in your legs or groin -pain at site where injected or implanted -seizures -severe headache -swelling of the feet and legs -suicidal thoughts or other mood changes -visual changes -vomiting Side effects that usually do not require medical attention (report to your doctor or health care professional if they continue or are bothersome): -breast swelling or tenderness -decrease in sex drive or performance -diarrhea -hot flashes -loss of appetite -muscle, joint, or bone pains -  nausea -redness or irritation at site where injected or implanted -skin problems or acne This list may not describe all possible side effects. Call your doctor for medical advice about side effects. You may report side effects to FDA at 1-800-FDA-1088. Where should I keep my medicine? This drug is given in a hospital or clinic and will not be stored at home. NOTE: This sheet is a summary. It may not cover all possible information. If you have questions about this medicine, talk to your doctor, pharmacist, or  health care provider.  2018 Elsevier/Gold Standard (2015-09-26 09:45:53)

## 2016-12-02 NOTE — Telephone Encounter (Signed)
Scheduled apt per 8/8 los - Gave patient AVS and calender per los.  

## 2016-12-03 ENCOUNTER — Telehealth: Payer: Self-pay | Admitting: *Deleted

## 2016-12-03 LAB — URINE CULTURE: Organism ID, Bacteria: NO GROWTH

## 2016-12-03 NOTE — Telephone Encounter (Signed)
Lizzie will see Dr Alvy Bimler on Friday, 8/17 @ 1230. PET is @ 0800. Msg to scheduler

## 2016-12-07 ENCOUNTER — Telehealth: Payer: Self-pay

## 2016-12-07 NOTE — Telephone Encounter (Signed)
Pt c/o change in taste and smell of food since last chemo 8/8. She is not nauseated. She therefore has no appetite and getting weak. She weighed 100 # today at the MD office. She is able to drink water only. She drank about 3 cups yesterday and today. No fever, she is having night sweats which has been ongoing. Milk products give her diarrhea so she can't drink ensure. lvm with Dory Peru to call pt with non milk base drink supplements, or things to do to help with taste. PET and [facility] 8/17

## 2016-12-08 ENCOUNTER — Telehealth: Payer: Self-pay | Admitting: Nutrition

## 2016-12-08 ENCOUNTER — Inpatient Hospital Stay (HOSPITAL_COMMUNITY): Admission: RE | Admit: 2016-12-08 | Payer: Medicare Other | Source: Ambulatory Visit

## 2016-12-08 NOTE — Telephone Encounter (Signed)
Called and left message. Ask Caryl Pina to call the nurse back.

## 2016-12-08 NOTE — Telephone Encounter (Signed)
Please ask if she is able to keep hydrated, if not schedule IVF today and tomorrow

## 2016-12-08 NOTE — Telephone Encounter (Signed)
I received a message from nursing that patient was complaining of taste alterations and reported diarrhea with milk products. Contacted patient by telephone. Educated patient on lactose-free oral nutrition supplements as well as clear liquid nutrition supplements. Reviewed purchasing information. Educated patient on taste alterations. Questions were answered.  Teach back method used.  Contact information was provided I will mail fact sheets to patient's home address. Follow-up scheduled Wednesday 8/29, during treatment.

## 2016-12-09 ENCOUNTER — Emergency Department (HOSPITAL_COMMUNITY)
Admission: EM | Admit: 2016-12-09 | Discharge: 2016-12-09 | Disposition: A | Discharge status: Home / Self Care | Payer: Medicare Other | Attending: Emergency Medicine | Admitting: Emergency Medicine

## 2016-12-09 ENCOUNTER — Telehealth: Payer: Self-pay | Admitting: *Deleted

## 2016-12-09 DIAGNOSIS — Z79899 Other long term (current) drug therapy: Secondary | ICD-10-CM | POA: Diagnosis not present

## 2016-12-09 DIAGNOSIS — R109 Unspecified abdominal pain: Secondary | ICD-10-CM

## 2016-12-09 DIAGNOSIS — F17290 Nicotine dependence, other tobacco product, uncomplicated: Secondary | ICD-10-CM | POA: Diagnosis not present

## 2016-12-09 DIAGNOSIS — K644 Residual hemorrhoidal skin tags: Secondary | ICD-10-CM | POA: Insufficient documentation

## 2016-12-09 DIAGNOSIS — R1030 Lower abdominal pain, unspecified: Secondary | ICD-10-CM | POA: Diagnosis present

## 2016-12-09 LAB — URINALYSIS, ROUTINE W REFLEX MICROSCOPIC
BILIRUBIN URINE: NEGATIVE
Glucose, UA: NEGATIVE mg/dL
Hgb urine dipstick: NEGATIVE
Ketones, ur: NEGATIVE mg/dL
Leukocytes, UA: NEGATIVE
NITRITE: NEGATIVE
Protein, ur: NEGATIVE mg/dL
SPECIFIC GRAVITY, URINE: 1.02 (ref 1.005–1.030)
pH: 7 (ref 5.0–8.0)

## 2016-12-09 LAB — POC URINE PREG, ED: PREG TEST UR: NEGATIVE

## 2016-12-09 LAB — CBC
HEMATOCRIT: 40 % (ref 36.0–46.0)
Hemoglobin: 14.7 g/dL (ref 12.0–15.0)
MCH: 34.1 pg — AB (ref 26.0–34.0)
MCHC: 36.8 g/dL — AB (ref 30.0–36.0)
MCV: 92.8 fL (ref 78.0–100.0)
Platelets: 180 10*3/uL (ref 150–400)
RBC: 4.31 MIL/uL (ref 3.87–5.11)
RDW: 13.6 % (ref 11.5–15.5)
WBC: 4.4 10*3/uL (ref 4.0–10.5)

## 2016-12-09 LAB — COMPREHENSIVE METABOLIC PANEL
ALBUMIN: 4.7 g/dL (ref 3.5–5.0)
ALK PHOS: 65 U/L (ref 38–126)
ALT: 29 U/L (ref 14–54)
AST: 29 U/L (ref 15–41)
Anion gap: 8 (ref 5–15)
BILIRUBIN TOTAL: 2.5 mg/dL — AB (ref 0.3–1.2)
BUN: 13 mg/dL (ref 6–20)
CALCIUM: 9.5 mg/dL (ref 8.9–10.3)
CO2: 29 mmol/L (ref 22–32)
CREATININE: 0.75 mg/dL (ref 0.44–1.00)
Chloride: 102 mmol/L (ref 101–111)
Glucose, Bld: 92 mg/dL (ref 65–99)
Potassium: 4.4 mmol/L (ref 3.5–5.1)
Sodium: 139 mmol/L (ref 135–145)
Total Protein: 7.8 g/dL (ref 6.5–8.1)

## 2016-12-09 LAB — TYPE AND SCREEN
ABO/RH(D): O NEG
Antibody Screen: NEGATIVE

## 2016-12-09 LAB — LIPASE, BLOOD: Lipase: 20 U/L (ref 11–51)

## 2016-12-09 LAB — POC OCCULT BLOOD, ED: Fecal Occult Bld: POSITIVE — AB

## 2016-12-09 MED ORDER — DICYCLOMINE HCL 20 MG PO TABS: 20.0000 mg | ORAL_TABLET | Freq: Two times a day (BID) | ORAL | 0 refills | Status: DC | PRN

## 2016-12-09 MED ORDER — POLYETHYLENE GLYCOL 3350 17 GM/SCOOP PO POWD: ORAL | 0 refills | Status: DC

## 2016-12-09 MED ORDER — DICYCLOMINE HCL 10 MG PO CAPS
10.0000 mg | ORAL_CAPSULE | Freq: Once | ORAL | Status: AC
  Administered 2016-12-09: 10 mg via ORAL
  Filled 2016-12-09: qty 1

## 2016-12-09 NOTE — Telephone Encounter (Signed)
"  Blood in stool yesterday and this morning.  Really bad diarrhea for a week,  Three stools so far today, about six stools yesterday.  Bright red blood coming out with diarrhea.  Water in bowl is red.  Have not tried Imodium.  Bad pain in my abdomen yesterday but no pain today.  Not eating.  Drink Ensure because nothing taste good.  Forced myself to eat [employer] yesterday.  Need to speak with nurse.  Return number [phone] or [phone]."  Dressed, ready to go to ED as this nurse stated is the just action for rectal bleeding.  Will notify provider for orders.  Currently schedule for PET at 0900 on Friday (12-11-2016) then MD F/U 12:30 pm after NM.  Routing call information to collaborative nurse and provider for review with further patient communication through collaborative nurse.

## 2016-12-09 NOTE — ED Triage Notes (Signed)
Pt c/o nausea, emesis, diarrhea and abdominal pain onset after getting IV chemotherapy on 12/02/16. Last night onset bright red blood in diarrhea, turned toilet water bright red, and vaginal and rectal pain. Dizziness and lightheadedness.

## 2016-12-09 NOTE — Telephone Encounter (Signed)
Called patient back regarding below message. Instructed to go to ED. States that she has lost 10 lbs and feels so weak. She said she is alone, instructed to call 911 and go to ED. Offered to call, she said she will call.
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
## 2016-12-09 NOTE — Telephone Encounter (Signed)
Pt called she is at the ED, there is a long wait

## 2016-12-10 ENCOUNTER — Ambulatory Visit: Payer: Medicare Other | Admitting: Hematology and Oncology

## 2016-12-11 ENCOUNTER — Ambulatory Visit: Payer: Medicare Other | Admitting: Hematology and Oncology

## 2016-12-11 ENCOUNTER — Ambulatory Visit (HOSPITAL_COMMUNITY)
Admission: RE | Admit: 2016-12-11 | Discharge: 2016-12-11 | Disposition: A | Discharge status: Home / Self Care | Payer: Medicare Other | Source: Ambulatory Visit | Attending: Hematology and Oncology | Admitting: Hematology and Oncology

## 2016-12-11 DIAGNOSIS — C8118 Nodular sclerosis classical Hodgkin lymphoma, lymph nodes of multiple sites: Secondary | ICD-10-CM | POA: Diagnosis present

## 2016-12-11 DIAGNOSIS — M899 Disorder of bone, unspecified: Secondary | ICD-10-CM | POA: Insufficient documentation

## 2016-12-11 LAB — GLUCOSE, CAPILLARY: GLUCOSE-CAPILLARY: 99 mg/dL (ref 65–99)

## 2016-12-11 MED ORDER — FLUDEOXYGLUCOSE F - 18 (FDG) INJECTION
5.0000 | Freq: Once | INTRAVENOUS | Status: AC | PRN
  Administered 2016-12-11: 5 via INTRAVENOUS

## 2016-12-14 ENCOUNTER — Ambulatory Visit (HOSPITAL_BASED_OUTPATIENT_CLINIC_OR_DEPARTMENT_OTHER): Payer: Medicare Other | Admitting: Hematology and Oncology

## 2016-12-14 ENCOUNTER — Telehealth: Payer: Self-pay | Admitting: Hematology and Oncology

## 2016-12-14 ENCOUNTER — Encounter: Payer: Self-pay | Admitting: Hematology and Oncology

## 2016-12-14 DIAGNOSIS — Z720 Tobacco use: Secondary | ICD-10-CM

## 2016-12-14 DIAGNOSIS — B20 Human immunodeficiency virus [HIV] disease: Secondary | ICD-10-CM | POA: Diagnosis not present

## 2016-12-14 DIAGNOSIS — F17210 Nicotine dependence, cigarettes, uncomplicated: Secondary | ICD-10-CM

## 2016-12-14 DIAGNOSIS — C8118 Nodular sclerosis classical Hodgkin lymphoma, lymph nodes of multiple sites: Secondary | ICD-10-CM | POA: Diagnosis present

## 2016-12-14 DIAGNOSIS — K625 Hemorrhage of anus and rectum: Secondary | ICD-10-CM

## 2016-12-14 NOTE — Progress Notes (Signed)
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      05/07/2014 Imaging    CT scan of the chest show right thoracic inlet lymphadenopathy      06/07/2014 Procedure    She underwent ultrasound-guided core biopsy of the neck lymph node      06/07/2014 Pathology Results    Accession: [id number] biopsy confirmed diagnosis of Hodgkin lymphoma.      06/15/2014 Imaging    Echocardiogram showed preserved ejection fraction      07/09/2014 - 07/12/2014 Hospital Admission    She was admitted to the hospital for severe anemia.      07/27/2014 Procedure    She had placement of port      07/31/2014 - 09/11/2014 Chemotherapy    She received dose adjusted chemotherapy due to abnormal liver function tests and severe anemia. Treatment was delayed due to noncompliance  and subsequently stopped because the patient failed to keep appointments      01/11/2015 Imaging    Repeat PET CT scan showed response to treatment      01/28/2015 - 06/18/2015 Chemotherapy    ABVD was restarted with full dose.      02/08/2015 - 02/10/2015 Hospital Admission    The patient was admitted to the hospital due to pancytopenia and profuse diarrhea. Cultures were negative. She was placed on ciprofloxacin.      02/11/2015 Adverse Reaction    Treatment was placed on hold due to recent infection.      04/11/2015 Imaging    PET CT scan showed near complete response. Incidental finding of an abnormal bone  lesion, indeterminate. She is not symptomatic. Recommendation from Hem [facility] to observe      07/11/2015 Imaging    PET CT scan showed abnormal new bone lesions, suggestive of possible disease progression      07/23/2015 Bone Marrow Biopsy    She underwent bone biopsy      07/23/2015 Pathology Results    Accession: [id number]  biopsy was negative for cancer      11/21/2015 Surgery    She had surgery for ectopic pregnancy      01/29/2016 Imaging    Ct chest, abdomen and pelvis showed pelvic and retroperitoneal lymphadenopathy, as above, concerning for residual disease. There is also a mildly enlarged posterior mediastinal lymph node measuring 11 mm adjacent to the distal descending thoracic aorta. This may represent an additional focus of disease, but is the only finding of concern in the thorax on today's examination. Sclerosis in the right ilium at site of previously noted metabolically active lesion, grossly unchanged. No other definite osseous lesions are identified on today's examination. Spleen is normal in size and appearance.      02/14/2016 PET scan    Interval disease worsening with new foci of hypermetabolic activity in multiple retroperitoneal and pelvic lymph nodes as well as AP window and left hilar lymph nodes. (Deauville 5). There is also overall worsening of the osseous disease.      05/11/2016 Pathology Results  Diagnosis Lymph node, needle/core biopsy, Left para-aortic retroperitoneal - CLASSICAL HODGKIN LYMPHOMA. - SEE ONCOLOGY TABLE. Microscopic Comment LYMPHOMA Histologic type: Classical Hodgkin lymphoma. Grade (if applicable): N/A Flow cytometry: Not done. Immunohistochemical stains: CD15, CD20, CD3, LCA, PAX-5, CD30 with appropriate controls. Touch preps/imprints: Not performed. Comments: The sections show small needle core biopsy fragments displaying a polymorphous cellular proliferation of small lymphocytes, plasma cells, eosinophils, and large atypical  mononuclear and multilobated lymphoid cells with features of Reed-Sternberg cells and variants. This is associated with patchy areas of fibrosis. Immunohistochemical stains were performed and show that the large atypical lymphoid cells are positive for CD30, CD15 and PAX-5 and negative for LCA, CD20, CD3. The small lymphoid cells in the background show a mixture of T and B cells with predominance of T cells. The overall morphologic and histologic features are consistent with classical Hodgkin lymphoma. Further subtyping is challenging in limited small biopsy fragments but the patchy fibrosis suggests nodular sclerosis type.       05/11/2016 Procedure    She underwent CT guided biopsy of retroperitoneal lymph node      05/26/2016 Procedure    Successful placement of a right IJ approach Power Port with ultrasound and fluoroscopic guidance. The catheter is ready for use.      06/02/2016 PET scan    Mixed response to chemotherapy with some lymph nodes decreased in metabolic activity and some lymph nodes increase metabolic activity. Lymph node stations including mediastinum, periaortic retroperitoneum, and obturator node stations. Activity is remains relatively intense Deauville 4 2. LEFT infrahilar nodule / lymph node with intense metabolic activity decreased from prior. ( Deauville 4 ). 3. 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 Deauville 5      06/03/2016 - 06/05/2016 Hospital Admission    She was admitted to the hospital for cycle 1 of ICE chemotherapy      06/24/2016 - 06/26/2016 Hospital Admission    She received cycle 2 of ICE chemo      07/07/2016 PET scan    Resolution of prior hypermetabolic adenopathy and resolution of prior osseous foci of hypermetabolic activity compatible with essentially complete response to therapy (Deauville 1). 2. Generalized reduced activity in the L4 vertebral body and the type of finding which would typically reflect prior radiation  therapy. 3. Stable septated fatty right pelvic lesion, possibly a dermoid or lipoma, not hypermetabolic      2/95/1884 PET scan    Hypermetabolic lesion along the L3 vertebral body and left posterior elements, max SUV 7.8 (Deauville 5). Hypermetabolic lesion along the left inferior pubic ramus, max SUV 4.9 (Deauville 4).  IMPRESSION: Prevascular lymphadenopathy, reflecting nodal recurrence (Deauville 4). Hypermetabolic osseous metastases involving the L3 vertebral body/posterior elements and left inferior pubic ramus (Deauville 4-5). Hypermetabolism along the endometrium, new, possibly reactive/physiologic. Consider pelvic ultrasound and/or endometrial sampling as clinically warranted.      11/04/2016 -  Chemotherapy    She received Brentuximab      12/11/2016 PET scan    1. Mixed response to therapy within the skeleton. Lesions at L3 is decreased in size and metabolic activity. Residual activity remains above liver ( Deauville 4) 2. Increased activity in the LEFT sacrum with metabolic activity above liver activity ( Deauville 4 3. Decrease in size and metabolic activity of anterior mediastinal tissue consistent with resolution of thymic hyperplasia or resolution of lymphoma metabolic activity ( Deauville 2). 4. No new lymphadenopathy. Normal spleen and liver.       INTERVAL HISTORY: Please  see below for problem oriented charting. She returns for further follow-up Rectal bleeding has resolved She denies new lymphadenopathy No recent infection She has not been to ID clinic for some time but continues to take her HIV medicine She denies bone pain. She takes tramadol for other musculoskeletal pain.  REVIEW OF SYSTEMS:   Constitutional: Denies fevers, chills or abnormal weight loss Eyes: Denies blurriness of vision Ears, nose, mouth, throat, and face: Denies mucositis or sore throat Respiratory: Denies cough, dyspnea or wheezes Cardiovascular: Denies palpitation, chest discomfort or  lower extremity swelling Gastrointestinal:  Denies nausea, heartburn or change in bowel habits Skin: Denies abnormal skin rashes Lymphatics: Denies new lymphadenopathy or easy bruising Neurological:Denies numbness, tingling or new weaknesses Behavioral/Psych: Mood is stable, no new changes  All other systems were reviewed with the patient and are negative.  I have reviewed the past medical history, past surgical history, social history and family history with the patient and they are unchanged from previous note.  ALLERGIES:  is allergic to temazepam.  MEDICATIONS:  Current Outpatient Prescriptions  Medication Sig Dispense Refill  . acyclovir ointment (ZOVIRAX) 5 % Apply 1 application topically every 3 (three) hours. (Patient not taking: Reported on 12/09/2016) 15 g 0  . albuterol (PROVENTIL HFA;VENTOLIN HFA) 108 (90 Base) MCG/ACT inhaler Inhale 2 puffs into the lungs every 4 (four) hours as needed for wheezing or shortness of breath. 1 Inhaler 0  . atazanavir (REYATAZ) 300 MG capsule Take 1 capsule (300 mg total) by mouth daily with breakfast. Take with Norvir. 30 capsule 5  . cholecalciferol (VITAMIN D) 1000 units tablet Take 1 tablet (1,000 Units total) by mouth daily. (Patient not taking: Reported on 11/11/2016) 30 tablet 9  . dicyclomine (BENTYL) 20 MG tablet Take 1 tablet (20 mg total) by mouth 2 (two) times daily as needed for spasms. 20 tablet 0  . emtricitabine-tenofovir (TRUVADA) 200-300 MG tablet Take 1 tablet by mouth daily. 30 tablet 5  . polyethylene glycol powder (MIRALAX) powder Take 1 capful 3 times a day. Slowly cut back as needed until you have normal bowel movements 255 g 0  . Polyvinyl Alcohol-Povidone (CLEAR EYES ALL SEASONS OP) Place 1 drop into both eyes daily.    . ritonavir (NORVIR) 100 MG TABS tablet TAKE 1 TABLET BY MOUTH EVERY MORNING WITH BREAKFAST. TAKE WITH REYATAZ 30 tablet 5  . traMADol (ULTRAM) 50 MG tablet Take 1 tablet (50 mg total) by mouth every 6 (six)  hours as needed. 90 tablet 0   No current facility-administered medications for this visit.     PHYSICAL EXAMINATION: ECOG PERFORMANCE STATUS: 1 - Symptomatic but completely ambulatory  Vitals:   12/14/16 0852  BP: 120/77  Pulse: 78  Resp: 18  Temp: 98.5 F (36.9 C)  SpO2: 98%   Filed Weights   12/14/16 0852  Weight: 102 lb 3.2 oz (46.4 kg)    GENERAL:alert, no distress and comfortable SKIN: skin color, texture, turgor are normal, no rashes or significant lesions EYES: normal, Conjunctiva are pink and non-injected, sclera clear Musculoskeletal:no cyanosis of digits and no clubbing  NEURO: alert & oriented x 3 with fluent speech, no focal motor/sensory deficits  LABORATORY DATA:  I have reviewed the data as listed    Component Value Date/Time   NA 139 12/09/2016 1107   NA 140 12/02/2016 0841   K 4.4 12/09/2016 1107   K 3.9 12/02/2016 0841   CL 102 12/09/2016 1107   CO2 29 12/09/2016 1107   CO2 24  12/02/2016 0841   GLUCOSE 92 12/09/2016 1107   GLUCOSE 94 12/02/2016 0841   BUN 13 12/09/2016 1107   BUN 8.3 12/02/2016 0841   CREATININE 0.75 12/09/2016 1107   CREATININE 0.9 12/02/2016 0841   CALCIUM 9.5 12/09/2016 1107   CALCIUM 9.9 12/02/2016 0841   PROT 7.8 12/09/2016 1107   PROT 7.4 12/02/2016 0841   ALBUMIN 4.7 12/09/2016 1107   ALBUMIN 4.1 12/02/2016 0841   AST 29 12/09/2016 1107   AST 21 12/02/2016 0841   ALT 29 12/09/2016 1107   ALT 18 12/02/2016 0841   ALKPHOS 65 12/09/2016 1107   ALKPHOS 79 12/02/2016 0841   BILITOT 2.5 (H) 12/09/2016 1107   BILITOT 3.25 (H) 12/02/2016 0841   GFRNONAA >60 12/09/2016 1107   GFRNONAA 83 02/20/2016 0908   GFRAA >60 12/09/2016 1107   GFRAA >89 02/20/2016 0908    No results found for: SPEP, UPEP  Lab Results  Component Value Date   WBC 4.4 12/09/2016   NEUTROABS 5.7 12/02/2016   HGB 14.7 12/09/2016   HCT 40.0 12/09/2016   MCV 92.8 12/09/2016   PLT 180 12/09/2016      Chemistry      Component Value Date/Time    NA 139 12/09/2016 1107   NA 140 12/02/2016 0841   K 4.4 12/09/2016 1107   K 3.9 12/02/2016 0841   CL 102 12/09/2016 1107   CO2 29 12/09/2016 1107   CO2 24 12/02/2016 0841   BUN 13 12/09/2016 1107   BUN 8.3 12/02/2016 0841   CREATININE 0.75 12/09/2016 1107   CREATININE 0.9 12/02/2016 0841      Component Value Date/Time   CALCIUM 9.5 12/09/2016 1107   CALCIUM 9.9 12/02/2016 0841   ALKPHOS 65 12/09/2016 1107   ALKPHOS 79 12/02/2016 0841   AST 29 12/09/2016 1107   AST 21 12/02/2016 0841   ALT 29 12/09/2016 1107   ALT 18 12/02/2016 0841   BILITOT 2.5 (H) 12/09/2016 1107   BILITOT 3.25 (H) 12/02/2016 0841       RADIOGRAPHIC STUDIES: I have reviewed imaging study with the patient I have personally reviewed the radiological images as listed and agreed with the findings in the report. Nm Pet Image Restag (ps) Skull Base To Thigh  Result Date: 12/11/2016 CLINICAL DATA:  Subsequent treatment strategy for Hodgkin's lymphoma. EXAM: NUCLEAR MEDICINE PET SKULL BASE TO THIGH TECHNIQUE: 5.0 mCi F-18 FDG was injected intravenously. Full-ring PET imaging was performed from the skull base to thigh after the radiotracer. CT data was obtained and used for attenuation correction and anatomic localization. FASTING BLOOD GLUCOSE:  Value: 99 mg/dl COMPARISON:  PET-CT 10/12/2016 FINDINGS: No hypermetabolic lymph nodes in the neck. CHEST Resolution of the metabolic activity within the anterior mediastinum / prevascular tissue with activity now equal with mediastinum. No new mediastinal lymphadenopathy. No suspicious pulmonary nodularity. Port in the RIGHT chest with tip in the distal SVC. ABDOMEN/PELVIS No abnormal or hypermetabolic lymph nodes in the abdomen or pelvis. No abnormal activity in the liver. The spleen is normal size with normal metabolic activity. Uterus and ovaries are grossly normal. SKELETON The volume of the lesion at L3 is decreased in size as well as the metabolic activity. There is small  of focus of activity remains in the LEFT aspect vertebral body with SUV max equal 4.3 decreased from the larger lesion previously with SUV max equals 7.8. Activity remains greater than liver. Resolution of activity in the LEFT pedicle at L3. However, lesion in the posterior  aspect of the LEFT sacrum is increased in activity with SUV max equal 5.0 increased from 3.0. The activity in the LEFT sacrum is elevated above liver activity. IMPRESSION: 1. Mixed response to therapy within the skeleton. Lesions at L3 is decreased in size and metabolic activity. Residual activity remains above liver ( Deauville 4) 2. Increased activity in the LEFT sacrum with metabolic activity above liver activity ( Deauville 4 3. Decrease in size and metabolic activity of anterior mediastinal tissue consistent with resolution of thymic hyperplasia or resolution of lymphoma metabolic activity ( Deauville 2). 4. No new lymphadenopathy.  Normal spleen and liver. Electronically Signed   By: Suzy Bouchard M.D.   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  The patient agree with the plan of care.  Human immunodeficiency virus (HIV) disease ([place]) Her last HIV viral load was undetectable. She will continue anti-retroviral treatment She has not seen infectious disease team for a while I reminded her to make an appointment to see them back  Rectal bleeding She had recent rectal bleeding due to hemorrhoidal bleeding We discussed stool  softeners and management for constipation.  It had resolved.  Cigarette smoker I spent some time counseling the patient the importance of tobacco cessation. she is currently attempting to quit on her own     No orders of the defined types were placed in this encounter.  All questions were answered. The patient knows to call the clinic with any problems, questions or concerns. No barriers to learning was detected. I spent 15 minutes counseling the patient face to face. The total time spent in the appointment was 20 minutes and more than 50% was on counseling and review of test results     Heath Lark, MD 12/14/2016 9:52 AM

## 2016-12-14 NOTE — Assessment & Plan Note (Signed)
I reviewed the PET CT scan with the patient There is nothing definitive to suggest evidence of cancer, except for persistent activity within the lesion in L3 and the left sacrum Prior bone biopsy did not reveal cancer within her bone marrow I will get her case presented at the hematology tumor board tomorrow My inclination would be to proceed with further Brentuximab every 3 weeks as scheduled indefinitely and plan to repeat PET CT scan in November 2018.  The patient agree with the plan of care.

## 2016-12-14 NOTE — Assessment & Plan Note (Addendum)
Her last HIV viral load was undetectable. She will continue anti-retroviral treatment She has not seen infectious disease team for a while I reminded her to make an appointment to see them back

## 2016-12-14 NOTE — Assessment & Plan Note (Signed)
She had recent rectal bleeding due to hemorrhoidal bleeding We discussed stool softeners and management for constipation.  It had resolved.

## 2016-12-14 NOTE — Telephone Encounter (Signed)
Gave pt avs and calendar for upcoming appts.  °

## 2016-12-14 NOTE — Assessment & Plan Note (Signed)
I spent some time counseling the patient the importance of tobacco cessation. she is currently attempting to quit on her own 

## 2016-12-15 ENCOUNTER — Ambulatory Visit (INDEPENDENT_AMBULATORY_CARE_PROVIDER_SITE_OTHER): Payer: Medicare Other | Admitting: Internal Medicine

## 2016-12-15 ENCOUNTER — Other Ambulatory Visit: Payer: Self-pay | Admitting: *Deleted

## 2016-12-15 ENCOUNTER — Other Ambulatory Visit: Payer: Self-pay | Admitting: Internal Medicine

## 2016-12-15 VITALS — BP 130/88 | HR 83 | Temp 98.0°F | Wt 102.0 lb

## 2016-12-15 DIAGNOSIS — E43 Unspecified severe protein-calorie malnutrition: Secondary | ICD-10-CM

## 2016-12-15 DIAGNOSIS — B20 Human immunodeficiency virus [HIV] disease: Secondary | ICD-10-CM | POA: Diagnosis present

## 2016-12-15 DIAGNOSIS — F17210 Nicotine dependence, cigarettes, uncomplicated: Secondary | ICD-10-CM | POA: Diagnosis not present

## 2016-12-15 MED ORDER — DRONABINOL 5 MG PO CAPS: 5.0000 mg | ORAL_CAPSULE | Freq: Two times a day (BID) | ORAL | 5 refills | Status: DC

## 2016-12-15 MED ORDER — HYDROXYZINE HCL 50 MG PO TABS: 50.0000 mg | ORAL_TABLET | Freq: Three times a day (TID) | ORAL | 2 refills | Status: DC | PRN

## 2016-12-15 NOTE — Assessment & Plan Note (Signed)
Labs today to be sure it is suppressed.

## 2016-12-15 NOTE — Progress Notes (Signed)
   Subjective:    Patient ID: [name], female    DOB: May 05, 1985, 31 y.o.   MRN: [id number]  HPI Here for follow up of HIV She continues on Reyataz, norvir and Truvada and denies any missed doses.  Under the care of Dr. Alvy Bimler for Hodgkin's lymphoma.  Recent PET scan with no definitive cancer and being presented to tumor board.  Plan for chemo, per patient scheduled for 8/29.  Some loose stools but improving.  Continued weight loss with poor appetite.  Itchy lesions on face.  Takes her ARVs daily with no missed doses.    Review of Systems  Constitutional: Positive for fatigue.  Gastrointestinal: Positive for diarrhea. Negative for nausea.  Skin: Positive for rash.  Neurological: Negative for dizziness.       Objective:   Physical Exam  Constitutional:  Thin-appearing  Eyes: No scleral icterus.  Cardiovascular: Normal rate, regular rhythm and normal heart sounds.   No murmur heard. Pulmonary/Chest: Effort normal and breath sounds normal. No respiratory distress.  Lymphadenopathy:    She has no cervical adenopathy.  Skin: Rash noted.  Facial rash   SH: continues to smoke       Assessment & Plan:

## 2016-12-15 NOTE — Assessment & Plan Note (Signed)
counseled

## 2016-12-15 NOTE — Assessment & Plan Note (Signed)
I will give her marinol for appetite.

## 2016-12-16 LAB — T-HELPER CELL (CD4) - (RCID CLINIC ONLY)
CD4 % Helper T Cell: 41 % (ref 33–55)
CD4 T Cell Abs: 530 /uL (ref 400–2700)

## 2016-12-18 LAB — HIV-1 RNA QUANT-NO REFLEX-BLD
HIV 1 RNA QUANT: DETECTED {copies}/mL — AB
HIV-1 RNA QUANT, LOG: DETECTED {Log_copies}/mL — AB

## 2016-12-22 ENCOUNTER — Other Ambulatory Visit: Payer: Self-pay | Admitting: Hematology and Oncology

## 2016-12-22 ENCOUNTER — Telehealth: Payer: Self-pay | Admitting: *Deleted

## 2016-12-22 DIAGNOSIS — B20 Human immunodeficiency virus [HIV] disease: Secondary | ICD-10-CM

## 2016-12-22 MED ORDER — TRAMADOL HCL 50 MG PO TABS: 50.0000 mg | ORAL_TABLET | Freq: Four times a day (QID) | ORAL | 0 refills | Status: DC | PRN

## 2016-12-22 NOTE — Telephone Encounter (Signed)
Vanesha called requesting a refill of tramadol. Was too early to refill today. Will be at [facility] on Wednesday

## 2016-12-23 ENCOUNTER — Ambulatory Visit (HOSPITAL_BASED_OUTPATIENT_CLINIC_OR_DEPARTMENT_OTHER): Payer: Medicare Other

## 2016-12-23 ENCOUNTER — Other Ambulatory Visit (HOSPITAL_COMMUNITY)
Admission: AD | Admit: 2016-12-23 | Discharge: 2016-12-23 | Disposition: A | Discharge status: Home / Self Care | Payer: Medicare Other | Source: Ambulatory Visit | Attending: Hematology and Oncology | Admitting: Hematology and Oncology

## 2016-12-23 ENCOUNTER — Ambulatory Visit: Payer: Medicare Other | Admitting: Nutrition

## 2016-12-23 ENCOUNTER — Other Ambulatory Visit (HOSPITAL_BASED_OUTPATIENT_CLINIC_OR_DEPARTMENT_OTHER): Payer: Medicare Other

## 2016-12-23 ENCOUNTER — Ambulatory Visit: Payer: Medicare Other

## 2016-12-23 VITALS — BP 112/72 | HR 56 | Temp 98.2°F | Resp 17

## 2016-12-23 DIAGNOSIS — Z95828 Presence of other vascular implants and grafts: Secondary | ICD-10-CM

## 2016-12-23 DIAGNOSIS — C8118 Nodular sclerosis classical Hodgkin lymphoma, lymph nodes of multiple sites: Secondary | ICD-10-CM | POA: Diagnosis not present

## 2016-12-23 DIAGNOSIS — Z5112 Encounter for antineoplastic immunotherapy: Secondary | ICD-10-CM

## 2016-12-23 LAB — CBC WITH DIFFERENTIAL/PLATELET
BASO%: 0.8 % (ref 0.0–2.0)
BASOS ABS: 0 10*3/uL (ref 0.0–0.1)
EOS%: 0.1 % (ref 0.0–7.0)
Eosinophils Absolute: 0 10*3/uL (ref 0.0–0.5)
HEMATOCRIT: 38.9 % (ref 34.8–46.6)
HEMOGLOBIN: 13.6 g/dL (ref 11.6–15.9)
LYMPH#: 1.3 10*3/uL (ref 0.9–3.3)
LYMPH%: 24.2 % (ref 14.0–49.7)
MCH: 34.6 pg — AB (ref 25.1–34.0)
MCHC: 35.1 g/dL (ref 31.5–36.0)
MCV: 98.6 fL (ref 79.5–101.0)
MONO#: 0.6 10*3/uL (ref 0.1–0.9)
MONO%: 12.1 % (ref 0.0–14.0)
NEUT%: 62.8 % (ref 38.4–76.8)
NEUTROS ABS: 3.3 10*3/uL (ref 1.5–6.5)
Platelets: 270 10*3/uL (ref 145–400)
RBC: 3.94 10*6/uL (ref 3.70–5.45)
RDW: 15.1 % — AB (ref 11.2–14.5)
WBC: 5.2 10*3/uL (ref 3.9–10.3)

## 2016-12-23 LAB — COMPREHENSIVE METABOLIC PANEL
ALBUMIN: 3.9 g/dL (ref 3.5–5.0)
ALK PHOS: 73 U/L (ref 40–150)
ALT: 19 U/L (ref 0–55)
ANION GAP: 8 meq/L (ref 3–11)
AST: 17 U/L (ref 5–34)
BILIRUBIN TOTAL: 0.25 mg/dL (ref 0.20–1.20)
BUN: 8.6 mg/dL (ref 7.0–26.0)
CALCIUM: 9.9 mg/dL (ref 8.4–10.4)
CO2: 27 mEq/L (ref 22–29)
CREATININE: 0.9 mg/dL (ref 0.6–1.1)
Chloride: 106 mEq/L (ref 98–109)
Glucose: 99 mg/dl (ref 70–140)
Potassium: 3.9 mEq/L (ref 3.5–5.1)
Sodium: 141 mEq/L (ref 136–145)
TOTAL PROTEIN: 7.1 g/dL (ref 6.4–8.3)

## 2016-12-23 LAB — PREGNANCY, URINE: Preg Test, Ur: NEGATIVE

## 2016-12-23 MED ORDER — DEXAMETHASONE SODIUM PHOSPHATE 10 MG/ML IJ SOLN
INTRAMUSCULAR | Status: AC
  Filled 2016-12-23: qty 1

## 2016-12-23 MED ORDER — SODIUM CHLORIDE 0.9 % IV SOLN
1.8000 mg/kg | Freq: Once | INTRAVENOUS | Status: AC
  Administered 2016-12-23: 90 mg via INTRAVENOUS
  Filled 2016-12-23: qty 18

## 2016-12-23 MED ORDER — ACETAMINOPHEN 325 MG PO TABS
650.0000 mg | ORAL_TABLET | Freq: Once | ORAL | Status: AC
  Administered 2016-12-23: 650 mg via ORAL

## 2016-12-23 MED ORDER — SODIUM CHLORIDE 0.9% FLUSH
10.0000 mL | INTRAVENOUS | Status: DC | PRN
  Administered 2016-12-23: 10 mL
  Filled 2016-12-23: qty 10

## 2016-12-23 MED ORDER — DEXAMETHASONE SODIUM PHOSPHATE 10 MG/ML IJ SOLN
10.0000 mg | Freq: Once | INTRAMUSCULAR | Status: AC
  Administered 2016-12-23: 10 mg via INTRAVENOUS

## 2016-12-23 MED ORDER — DIPHENHYDRAMINE HCL 50 MG/ML IJ SOLN
INTRAMUSCULAR | Status: AC
  Filled 2016-12-23: qty 1

## 2016-12-23 MED ORDER — HEPARIN SOD (PORK) LOCK FLUSH 100 UNIT/ML IV SOLN
500.0000 [IU] | Freq: Once | INTRAVENOUS | Status: AC | PRN
  Administered 2016-12-23: 500 [IU]
  Filled 2016-12-23: qty 5

## 2016-12-23 MED ORDER — SODIUM CHLORIDE 0.9% FLUSH
10.0000 mL | INTRAVENOUS | Status: DC | PRN
  Administered 2016-12-23: 10 mL via INTRAVENOUS
  Filled 2016-12-23: qty 10

## 2016-12-23 MED ORDER — DIPHENHYDRAMINE HCL 50 MG/ML IJ SOLN
50.0000 mg | Freq: Once | INTRAMUSCULAR | Status: AC
  Administered 2016-12-23: 50 mg via INTRAVENOUS

## 2016-12-23 MED ORDER — SODIUM CHLORIDE 0.9 % IV SOLN
Freq: Once | INTRAVENOUS | Status: AC
  Administered 2016-12-23: 10:00:00 via INTRAVENOUS

## 2016-12-23 MED ORDER — ACETAMINOPHEN 325 MG PO TABS
ORAL_TABLET | ORAL | Status: AC
Start: 2016-12-23 — End: 2016-12-23
  Filled 2016-12-23: qty 2

## 2016-12-23 NOTE — Patient Instructions (Signed)
[facility] [place] Discharge Instructions for Patients Receiving Chemotherapy  Today you received the following chemotherapy agents: Brentuximab   To help prevent nausea and vomiting after your treatment, we encourage you to take your nausea medication as prescribed. If you develop nausea and vomiting that is not controlled by your nausea medication, call the clinic.   BELOW ARE SYMPTOMS THAT SHOULD BE REPORTED IMMEDIATELY:  *FEVER GREATER THAN 100.5 F  *CHILLS WITH OR WITHOUT FEVER  NAUSEA AND VOMITING THAT IS NOT CONTROLLED WITH YOUR NAUSEA MEDICATION  *UNUSUAL SHORTNESS OF BREATH  *UNUSUAL BRUISING OR BLEEDING  TENDERNESS IN MOUTH AND THROAT WITH OR WITHOUT PRESENCE OF ULCERS  *URINARY PROBLEMS  *BOWEL PROBLEMS  UNUSUAL RASH Items with * indicate a potential emergency and should be followed up as soon as possible.  Feel free to call the clinic you have any questions or concerns. The clinic phone number is [phone].  Please show the [place] at check-in to the Emergency Department and triage nurse.

## 2016-12-23 NOTE — Progress Notes (Signed)
31 year old female diagnosed with Hodgkin's lymphoma.  She is a patient of Dr. Alvy Bimler   Past medical history includes HIV, esophagitis, and depression.  Medications include vitamin D, MiraLAX, and Marinol.  Labs were reviewed.  Height: 64 inches. Weight: 102 pounds on August 21. Usual body weight: 110 pounds per patient. BMI: 17.51  Patient reports she has always been thin. She states recent weight loss due to inability to eat secondary to chemotherapy. She describes nausea like symptoms. She reports taste alterations. She eats one meal a day.  Drinks 2 Equate plus.  Nutrition diagnosis:  Unintended weight loss related to Hodgkin's lymphoma as evidenced by 6 pound weight loss from usual body weight.  Intervention: Educated patient on strategies to increase calories and protein, and try to eat every 2-3 hours. Encouraged patient to try to increase oral nutrition supplements 3 times a day Educated patient on strategies for nausea and encouraged her to consume cold foods versus warm foods. Provided fact sheets on increasing calories and protein and nausea and vomiting. Provided oral nutrition supplement samples and coupons. Questions were answered.  Teach back method used.  Monitoring, evaluation, goals: Patient will tolerate increased calories and protein to minimize weight loss.  Next visit: Follow-up will be scheduled as needed.  **Disclaimer: This note was dictated with voice recognition software. Similar sounding words can inadvertently be transcribed and this note may contain transcription errors which may not have been corrected upon publication of note.**

## 2017-01-01 ENCOUNTER — Ambulatory Visit: Payer: Medicare Other

## 2017-01-11 ENCOUNTER — Telehealth: Payer: Self-pay

## 2017-01-11 ENCOUNTER — Other Ambulatory Visit: Payer: Self-pay | Admitting: Hematology and Oncology

## 2017-01-11 DIAGNOSIS — B20 Human immunodeficiency virus [HIV] disease: Secondary | ICD-10-CM

## 2017-01-11 MED ORDER — TRAMADOL HCL 50 MG PO TABS: 50.0000 mg | ORAL_TABLET | Freq: Four times a day (QID) | ORAL | 0 refills | Status: DC | PRN

## 2017-01-11 NOTE — Telephone Encounter (Signed)
Called with below message. 

## 2017-01-11 NOTE — Telephone Encounter (Signed)
Pt called for tramadol refill. Last received #90 on 8/28.

## 2017-01-11 NOTE — Telephone Encounter (Signed)
Ready for pick up

## 2017-01-13 ENCOUNTER — Telehealth: Payer: Self-pay | Admitting: Hematology and Oncology

## 2017-01-13 ENCOUNTER — Encounter: Payer: Self-pay | Admitting: Hematology and Oncology

## 2017-01-13 ENCOUNTER — Other Ambulatory Visit (HOSPITAL_COMMUNITY)
Admission: RE | Admit: 2017-01-13 | Discharge: 2017-01-13 | Disposition: A | Discharge status: Home / Self Care | Payer: Medicare Other | Source: Ambulatory Visit | Attending: Hematology and Oncology | Admitting: Hematology and Oncology

## 2017-01-13 ENCOUNTER — Other Ambulatory Visit (HOSPITAL_BASED_OUTPATIENT_CLINIC_OR_DEPARTMENT_OTHER): Payer: Medicare Other

## 2017-01-13 ENCOUNTER — Ambulatory Visit (HOSPITAL_BASED_OUTPATIENT_CLINIC_OR_DEPARTMENT_OTHER): Payer: Medicare Other

## 2017-01-13 ENCOUNTER — Ambulatory Visit: Payer: Medicare Other

## 2017-01-13 ENCOUNTER — Ambulatory Visit (HOSPITAL_BASED_OUTPATIENT_CLINIC_OR_DEPARTMENT_OTHER): Payer: Medicare Other | Admitting: Hematology and Oncology

## 2017-01-13 VITALS — BP 116/69 | HR 75 | Temp 98.7°F | Resp 20 | Ht 64.0 in | Wt 101.8 lb

## 2017-01-13 DIAGNOSIS — M791 Myalgia: Secondary | ICD-10-CM | POA: Diagnosis not present

## 2017-01-13 DIAGNOSIS — Z720 Tobacco use: Secondary | ICD-10-CM

## 2017-01-13 DIAGNOSIS — C8118 Nodular sclerosis classical Hodgkin lymphoma, lymph nodes of multiple sites: Secondary | ICD-10-CM

## 2017-01-13 DIAGNOSIS — Z95828 Presence of other vascular implants and grafts: Secondary | ICD-10-CM

## 2017-01-13 DIAGNOSIS — N951 Menopausal and female climacteric states: Secondary | ICD-10-CM | POA: Diagnosis not present

## 2017-01-13 DIAGNOSIS — Z5111 Encounter for antineoplastic chemotherapy: Secondary | ICD-10-CM

## 2017-01-13 DIAGNOSIS — Z5112 Encounter for antineoplastic immunotherapy: Secondary | ICD-10-CM

## 2017-01-13 DIAGNOSIS — B20 Human immunodeficiency virus [HIV] disease: Secondary | ICD-10-CM

## 2017-01-13 DIAGNOSIS — F17210 Nicotine dependence, cigarettes, uncomplicated: Secondary | ICD-10-CM

## 2017-01-13 LAB — COMPREHENSIVE METABOLIC PANEL
ALK PHOS: 77 U/L (ref 40–150)
ALT: 13 U/L (ref 0–55)
ANION GAP: 9 meq/L (ref 3–11)
AST: 17 U/L (ref 5–34)
Albumin: 4.1 g/dL (ref 3.5–5.0)
BUN: 13.9 mg/dL (ref 7.0–26.0)
CO2: 26 mEq/L (ref 22–29)
Calcium: 9.7 mg/dL (ref 8.4–10.4)
Chloride: 105 mEq/L (ref 98–109)
Creatinine: 0.8 mg/dL (ref 0.6–1.1)
GLUCOSE: 91 mg/dL (ref 70–140)
POTASSIUM: 3.8 meq/L (ref 3.5–5.1)
SODIUM: 140 meq/L (ref 136–145)
Total Bilirubin: 0.8 mg/dL (ref 0.20–1.20)
Total Protein: 7.4 g/dL (ref 6.4–8.3)

## 2017-01-13 LAB — CBC WITH DIFFERENTIAL/PLATELET
BASO%: 0.5 % (ref 0.0–2.0)
Basophils Absolute: 0 10*3/uL (ref 0.0–0.1)
EOS%: 0 % (ref 0.0–7.0)
Eosinophils Absolute: 0 10*3/uL (ref 0.0–0.5)
HEMATOCRIT: 38.5 % (ref 34.8–46.6)
HEMOGLOBIN: 13.1 g/dL (ref 11.6–15.9)
LYMPH#: 1.5 10*3/uL (ref 0.9–3.3)
LYMPH%: 36.9 % (ref 14.0–49.7)
MCH: 33.5 pg (ref 25.1–34.0)
MCHC: 34 g/dL (ref 31.5–36.0)
MCV: 98.5 fL (ref 79.5–101.0)
MONO#: 0.4 10*3/uL (ref 0.1–0.9)
MONO%: 10.5 % (ref 0.0–14.0)
NEUT%: 52.1 % (ref 38.4–76.8)
NEUTROS ABS: 2.1 10*3/uL (ref 1.5–6.5)
PLATELETS: 277 10*3/uL (ref 145–400)
RBC: 3.91 10*6/uL (ref 3.70–5.45)
RDW: 15.4 % — AB (ref 11.2–14.5)
WBC: 4 10*3/uL (ref 3.9–10.3)

## 2017-01-13 LAB — PREGNANCY, URINE: Preg Test, Ur: NEGATIVE

## 2017-01-13 MED ORDER — HEPARIN SOD (PORK) LOCK FLUSH 100 UNIT/ML IV SOLN
500.0000 [IU] | Freq: Once | INTRAVENOUS | Status: AC | PRN
  Administered 2017-01-13: 500 [IU]
  Filled 2017-01-13: qty 5

## 2017-01-13 MED ORDER — LEUPROLIDE ACETATE 7.5 MG IM KIT
7.5000 mg | PACK | Freq: Once | INTRAMUSCULAR | Status: AC
  Administered 2017-01-13: 7.5 mg via INTRAMUSCULAR
  Filled 2017-01-13: qty 7.5

## 2017-01-13 MED ORDER — NICOTINE 7 MG/24HR TD PT24
7.0000 mg | MEDICATED_PATCH | Freq: Every day | TRANSDERMAL | 0 refills | Status: DC
Start: 2017-01-13 — End: 2017-02-24

## 2017-01-13 MED ORDER — DEXAMETHASONE SODIUM PHOSPHATE 10 MG/ML IJ SOLN
INTRAMUSCULAR | Status: AC
  Filled 2017-01-13: qty 1

## 2017-01-13 MED ORDER — DEXAMETHASONE SODIUM PHOSPHATE 10 MG/ML IJ SOLN
10.0000 mg | Freq: Once | INTRAMUSCULAR | Status: AC
  Administered 2017-01-13: 10 mg via INTRAVENOUS

## 2017-01-13 MED ORDER — SODIUM CHLORIDE 0.9% FLUSH
10.0000 mL | INTRAVENOUS | Status: DC | PRN
  Administered 2017-01-13: 10 mL
  Filled 2017-01-13: qty 10

## 2017-01-13 MED ORDER — DIPHENHYDRAMINE HCL 50 MG/ML IJ SOLN
INTRAMUSCULAR | Status: AC
  Filled 2017-01-13: qty 1

## 2017-01-13 MED ORDER — SODIUM CHLORIDE 0.9 % IV SOLN
1.8000 mg/kg | Freq: Once | INTRAVENOUS | Status: AC
  Administered 2017-01-13: 90 mg via INTRAVENOUS
  Filled 2017-01-13: qty 18

## 2017-01-13 MED ORDER — SODIUM CHLORIDE 0.9 % IV SOLN
Freq: Once | INTRAVENOUS | Status: AC
  Administered 2017-01-13: 10:00:00 via INTRAVENOUS

## 2017-01-13 MED ORDER — ACETAMINOPHEN 325 MG PO TABS
650.0000 mg | ORAL_TABLET | Freq: Once | ORAL | Status: AC
  Administered 2017-01-13: 650 mg via ORAL

## 2017-01-13 MED ORDER — DIPHENHYDRAMINE HCL 50 MG/ML IJ SOLN
50.0000 mg | Freq: Once | INTRAMUSCULAR | Status: AC
  Administered 2017-01-13: 50 mg via INTRAVENOUS

## 2017-01-13 MED ORDER — SODIUM CHLORIDE 0.9% FLUSH
10.0000 mL | INTRAVENOUS | Status: DC | PRN
Start: 2017-01-13 — End: 2017-01-13
  Administered 2017-01-13: 10 mL via INTRAVENOUS
  Filled 2017-01-13: qty 10

## 2017-01-13 MED ORDER — ACETAMINOPHEN 325 MG PO TABS
ORAL_TABLET | ORAL | Status: AC
  Filled 2017-01-13: qty 2

## 2017-01-13 NOTE — Assessment & Plan Note (Signed)
We discussed the importance of nicotine cessation I will prescribe nicotine patch

## 2017-01-13 NOTE — Progress Notes (Signed)
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      05/07/2014 Imaging    CT scan of the chest show right thoracic inlet lymphadenopathy      06/07/2014 Procedure    She underwent ultrasound-guided core biopsy of the neck lymph node      06/07/2014 Pathology Results    Accession: [id number] biopsy confirmed diagnosis of Hodgkin lymphoma.      06/15/2014 Imaging    Echocardiogram showed preserved ejection fraction      07/09/2014 - 07/12/2014 Hospital Admission    She was admitted to the hospital for severe anemia.      07/27/2014 Procedure    She had placement of port      07/31/2014 - 09/11/2014 Chemotherapy    She received dose adjusted chemotherapy due to abnormal liver function tests and severe anemia. Treatment was delayed due to noncompliance  and subsequently stopped because the patient failed to keep appointments      01/11/2015 Imaging    Repeat PET CT scan showed response to treatment      01/28/2015 - 06/18/2015 Chemotherapy    ABVD was restarted with full dose.      02/08/2015 - 02/10/2015 Hospital Admission    The patient was admitted to the hospital due to pancytopenia and profuse diarrhea. Cultures were negative. She was placed on ciprofloxacin.      02/11/2015 Adverse Reaction    Treatment was placed on hold due to recent infection.      04/11/2015 Imaging    PET CT scan showed near complete response. Incidental finding of an abnormal bone  lesion, indeterminate. She is not symptomatic. Recommendation from Hem [facility] to observe      07/11/2015 Imaging    PET CT scan showed abnormal new bone lesions, suggestive of possible disease progression      07/23/2015 Bone Marrow Biopsy    She underwent bone biopsy      07/23/2015 Pathology Results    Accession: [id number]  biopsy was negative for cancer      11/21/2015 Surgery    She had surgery for ectopic pregnancy      01/29/2016 Imaging    Ct chest, abdomen and pelvis showed pelvic and retroperitoneal lymphadenopathy, as above, concerning for residual disease. There is also a mildly enlarged posterior mediastinal lymph node measuring 11 mm adjacent to the distal descending thoracic aorta. This may represent an additional focus of disease, but is the only finding of concern in the thorax on today's examination. Sclerosis in the right ilium at site of previously noted metabolically active lesion, grossly unchanged. No other definite osseous lesions are identified on today's examination. Spleen is normal in size and appearance.      02/14/2016 PET scan    Interval disease worsening with new foci of hypermetabolic activity in multiple retroperitoneal and pelvic lymph nodes as well as AP window and left hilar lymph nodes. (Deauville 5). There is also overall worsening of the osseous disease.      05/11/2016 Pathology Results  Diagnosis Lymph node, needle/core biopsy, Left para-aortic retroperitoneal - CLASSICAL HODGKIN LYMPHOMA. - SEE ONCOLOGY TABLE. Microscopic Comment LYMPHOMA Histologic type: Classical Hodgkin lymphoma. Grade (if applicable): N/A Flow cytometry: Not done. Immunohistochemical stains: CD15, CD20, CD3, LCA, PAX-5, CD30 with appropriate controls. Touch preps/imprints: Not performed. Comments: The sections show small needle core biopsy fragments displaying a polymorphous cellular proliferation of small lymphocytes, plasma cells, eosinophils, and large atypical  mononuclear and multilobated lymphoid cells with features of Reed-Sternberg cells and variants. This is associated with patchy areas of fibrosis. Immunohistochemical stains were performed and show that the large atypical lymphoid cells are positive for CD30, CD15 and PAX-5 and negative for LCA, CD20, CD3. The small lymphoid cells in the background show a mixture of T and B cells with predominance of T cells. The overall morphologic and histologic features are consistent with classical Hodgkin lymphoma. Further subtyping is challenging in limited small biopsy fragments but the patchy fibrosis suggests nodular sclerosis type.       05/11/2016 Procedure    She underwent CT guided biopsy of retroperitoneal lymph node      05/26/2016 Procedure    Successful placement of a right IJ approach Power Port with ultrasound and fluoroscopic guidance. The catheter is ready for use.      06/02/2016 PET scan    Mixed response to chemotherapy with some lymph nodes decreased in metabolic activity and some lymph nodes increase metabolic activity. Lymph node stations including mediastinum, periaortic retroperitoneum, and obturator node stations. Activity is remains relatively intense Deauville 4 2. LEFT infrahilar nodule / lymph node with intense metabolic activity decreased from prior. ( Deauville 4 ). 3. 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 Deauville 5      06/03/2016 - 06/05/2016 Hospital Admission    She was admitted to the hospital for cycle 1 of ICE chemotherapy      06/24/2016 - 06/26/2016 Hospital Admission    She received cycle 2 of ICE chemo      07/07/2016 PET scan    Resolution of prior hypermetabolic adenopathy and resolution of prior osseous foci of hypermetabolic activity compatible with essentially complete response to therapy (Deauville 1). 2. Generalized reduced activity in the L4 vertebral body and the type of finding which would typically reflect prior radiation  therapy. 3. Stable septated fatty right pelvic lesion, possibly a dermoid or lipoma, not hypermetabolic      2/95/1884 PET scan    Hypermetabolic lesion along the L3 vertebral body and left posterior elements, max SUV 7.8 (Deauville 5). Hypermetabolic lesion along the left inferior pubic ramus, max SUV 4.9 (Deauville 4).  IMPRESSION: Prevascular lymphadenopathy, reflecting nodal recurrence (Deauville 4). Hypermetabolic osseous metastases involving the L3 vertebral body/posterior elements and left inferior pubic ramus (Deauville 4-5). Hypermetabolism along the endometrium, new, possibly reactive/physiologic. Consider pelvic ultrasound and/or endometrial sampling as clinically warranted.      11/04/2016 -  Chemotherapy    She received Brentuximab      12/11/2016 PET scan    1. Mixed response to therapy within the skeleton. Lesions at L3 is decreased in size and metabolic activity. Residual activity remains above liver ( Deauville 4) 2. Increased activity in the LEFT sacrum with metabolic activity above liver activity ( Deauville 4 3. Decrease in size and metabolic activity of anterior mediastinal tissue consistent with resolution of thymic hyperplasia or resolution of lymphoma metabolic activity ( Deauville 2). 4. No new lymphadenopathy. Normal spleen and liver.       INTERVAL HISTORY: Please  see below for problem oriented charting. She returns for further follow-up She denies recent fever or chills No new lymphadenopathy She continues to have chronic musculoskeletal pain and takes tramadol as needed She complain of intermittent hot flashes She is interested to use nicotine patch to quit smoking  REVIEW OF SYSTEMS:   Constitutional: Denies fevers, chills or abnormal weight loss Eyes: Denies blurriness of vision Ears, nose, mouth, throat, and face: Denies mucositis or sore throat Respiratory: Denies cough, dyspnea or wheezes Cardiovascular: Denies palpitation, chest discomfort or  lower extremity swelling Gastrointestinal:  Denies nausea, heartburn or change in bowel habits Skin: Denies abnormal skin rashes Lymphatics: Denies new lymphadenopathy or easy bruising Neurological:Denies numbness, tingling or new weaknesses Behavioral/Psych: Mood is stable, no new changes  All other systems were reviewed with the patient and are negative.  I have reviewed the past medical history, past surgical history, social history and family history with the patient and they are unchanged from previous note.  ALLERGIES:  is allergic to temazepam.  MEDICATIONS:  Current Outpatient Prescriptions  Medication Sig Dispense Refill  . atazanavir (REYATAZ) 300 MG capsule Take 1 capsule (300 mg total) by mouth daily with breakfast. Take with Norvir. 30 capsule 5  . cholecalciferol (VITAMIN D) 1000 units tablet Take 1 tablet (1,000 Units total) by mouth daily. (Patient not taking: Reported on 11/11/2016) 30 tablet 9  . dicyclomine (BENTYL) 20 MG tablet Take 1 tablet (20 mg total) by mouth 2 (two) times daily as needed for spasms. (Patient not taking: Reported on 12/15/2016) 20 tablet 0  . dronabinol (MARINOL) 5 MG capsule Take 1 capsule (5 mg total) by mouth 2 (two) times daily before a meal. 60 capsule 5  . emtricitabine-tenofovir (TRUVADA) 200-300 MG tablet Take 1 tablet by mouth daily. 30 tablet 5  . hydrOXYzine (ATARAX/VISTARIL) 50 MG tablet Take 1 tablet (50 mg total) by mouth 3 (three) times daily as needed for itching. 30 tablet 2  . nicotine (NICODERM CQ - DOSED IN MG/24 HR) 7 mg/24hr patch Place 1 patch (7 mg total) onto the skin daily. 28 patch 0  . polyethylene glycol powder (MIRALAX) powder Take 1 capful 3 times a day. Slowly cut back as needed until you have normal bowel movements 255 g 0  . Polyvinyl Alcohol-Povidone (CLEAR EYES ALL SEASONS OP) Place 1 drop into both eyes daily.    . ritonavir (NORVIR) 100 MG TABS tablet TAKE 1 TABLET BY MOUTH EVERY MORNING WITH BREAKFAST. TAKE WITH  REYATAZ 30 tablet 5  . traMADol (ULTRAM) 50 MG tablet Take 1 tablet (50 mg total) by mouth every 6 (six) hours as needed. 90 tablet 0   No current facility-administered medications for this visit.     PHYSICAL EXAMINATION: ECOG PERFORMANCE STATUS: 0 - Asymptomatic  Vitals:   01/13/17 0822  BP: 116/69  Pulse: 75  Resp: 20  Temp: 98.7 F (37.1 C)  SpO2: 100%   Filed Weights   01/13/17 0822  Weight: 101 lb 12.8 oz (46.2 kg)    GENERAL:alert, no distress and comfortable SKIN: skin color, texture, turgor are normal, no rashes or significant lesions EYES: normal, Conjunctiva are pink and non-injected, sclera clear OROPHARYNX:no exudate, no erythema and lips, buccal mucosa, and tongue normal  NECK: supple, thyroid normal size, non-tender, without nodularity LYMPH:  no palpable lymphadenopathy in the cervical, axillary or inguinal LUNGS: clear to auscultation and percussion with normal breathing effort HEART: regular rate & rhythm and no murmurs and no lower extremity edema ABDOMEN:abdomen soft,  non-tender and normal bowel sounds Musculoskeletal:no cyanosis of digits and no clubbing  NEURO: alert & oriented x 3 with fluent speech, no focal motor/sensory deficits  LABORATORY DATA:  I have reviewed the data as listed    Component Value Date/Time   NA 141 12/23/2016 0827   K 3.9 12/23/2016 0827   CL 102 12/09/2016 1107   CO2 27 12/23/2016 0827   GLUCOSE 99 12/23/2016 0827   BUN 8.6 12/23/2016 0827   CREATININE 0.9 12/23/2016 0827   CALCIUM 9.9 12/23/2016 0827   PROT 7.1 12/23/2016 0827   ALBUMIN 3.9 12/23/2016 0827   AST 17 12/23/2016 0827   ALT 19 12/23/2016 0827   ALKPHOS 73 12/23/2016 0827   BILITOT 0.25 12/23/2016 0827   GFRNONAA >60 12/09/2016 1107   GFRNONAA 83 02/20/2016 0908   GFRAA >60 12/09/2016 1107   GFRAA >89 02/20/2016 0908    No results found for: SPEP, UPEP  Lab Results  Component Value Date   WBC 4.0 01/13/2017   NEUTROABS 2.1 01/13/2017   HGB  13.1 01/13/2017   HCT 38.5 01/13/2017   MCV 98.5 01/13/2017   PLT 277 01/13/2017      Chemistry      Component Value Date/Time   NA 141 12/23/2016 0827   K 3.9 12/23/2016 0827   CL 102 12/09/2016 1107   CO2 27 12/23/2016 0827   BUN 8.6 12/23/2016 0827   CREATININE 0.9 12/23/2016 0827      Component Value Date/Time   CALCIUM 9.9 12/23/2016 0827   ALKPHOS 73 12/23/2016 0827   AST 17 12/23/2016 0827   ALT 19 12/23/2016 0827   BILITOT 0.25 12/23/2016 0827      ASSESSMENT & PLAN:  Hodgkin lymphoma, 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  The patient agree with the plan of care. She will get Lupron injection every 4 weeks to suppress ovarian function  Cigarette smoker We discussed the importance of nicotine cessation I will prescribe nicotine patch  Human immunodeficiency virus (HIV) disease ([place]) Her last HIV viral load was undetectable. She will continue anti-retroviral treatment as directed   No orders of the defined types were placed in this encounter.  All questions were answered. The patient knows to call the clinic with any problems, questions or concerns. No barriers to learning was detected. I spent 15 minutes counseling the patient face to face. The total time spent in the appointment was 20 minutes and more than 50% was on counseling and review of test results     Heath Lark, MD 01/13/2017 8:57 AM

## 2017-01-13 NOTE — Telephone Encounter (Signed)
Scheduled appt per 9/19 los - patient is aware of appts added and will pick up a new schedule next visit.

## 2017-01-13 NOTE — Assessment & Plan Note (Addendum)
I reviewed the August PET CT scan with the patient There is nothing definitive to suggest evidence of cancer, except for persistent activity within the lesion in L3 and the left sacrum Prior bone biopsy did not reveal cancer within her bone marrow After recent discussion at the hematology tumor board, recommendation would be to not pursue further workup at this point My inclination would be to proceed with further Brentuximab every 3 weeks as scheduled indefinitely and plan to repeat PET CT scan in November 2018.  The patient agree with the plan of care. She will get Lupron injection every 4 weeks to suppress ovarian function

## 2017-01-13 NOTE — Assessment & Plan Note (Signed)
Her last HIV viral load was undetectable. She will continue anti-retroviral treatment as directed

## 2017-01-13 NOTE — Patient Instructions (Signed)
[facility] Discharge Instructions for Patients Receiving Chemotherapy  Today you received the following chemotherapy agents Adcetris.  To help prevent nausea and vomiting after your treatment, we encourage you to take your nausea medication as directed.    If you develop nausea and vomiting that is not controlled by your nausea medication, call the clinic.   BELOW ARE SYMPTOMS THAT SHOULD BE REPORTED IMMEDIATELY:  *FEVER GREATER THAN 100.5 F  *CHILLS WITH OR WITHOUT FEVER  NAUSEA AND VOMITING THAT IS NOT CONTROLLED WITH YOUR NAUSEA MEDICATION  *UNUSUAL SHORTNESS OF BREATH  *UNUSUAL BRUISING OR BLEEDING  TENDERNESS IN MOUTH AND THROAT WITH OR WITHOUT PRESENCE OF ULCERS  *URINARY PROBLEMS  *BOWEL PROBLEMS  UNUSUAL RASH Items with * indicate a potential emergency and should be followed up as soon as possible.  Feel free to call the clinic you have any questions or concerns. The clinic phone number is [phone].  Please show the CHEMO ALERT CARD at check-in to the Emergency Department and triage nurse.    

## 2017-01-19 ENCOUNTER — Encounter (HOSPITAL_COMMUNITY): Payer: Self-pay | Admitting: Emergency Medicine

## 2017-01-19 ENCOUNTER — Emergency Department (HOSPITAL_COMMUNITY)
Admission: EM | Admit: 2017-01-19 | Discharge: 2017-01-19 | Discharge status: Against Medical Advice | Payer: Medicare Other | Attending: Emergency Medicine | Admitting: Emergency Medicine

## 2017-01-19 DIAGNOSIS — R21 Rash and other nonspecific skin eruption: Secondary | ICD-10-CM | POA: Diagnosis present

## 2017-01-19 DIAGNOSIS — Z5321 Procedure and treatment not carried out due to patient leaving prior to being seen by health care provider: Secondary | ICD-10-CM | POA: Insufficient documentation

## 2017-01-19 LAB — COMPREHENSIVE METABOLIC PANEL
ALBUMIN: 4.7 g/dL (ref 3.5–5.0)
ALK PHOS: 75 U/L (ref 38–126)
ALT: 37 U/L (ref 14–54)
AST: 32 U/L (ref 15–41)
Anion gap: 11 (ref 5–15)
BILIRUBIN TOTAL: 2.8 mg/dL — AB (ref 0.3–1.2)
BUN: 14 mg/dL (ref 6–20)
CALCIUM: 9.7 mg/dL (ref 8.9–10.3)
CO2: 25 mmol/L (ref 22–32)
CREATININE: 0.81 mg/dL (ref 0.44–1.00)
Chloride: 102 mmol/L (ref 101–111)
GFR calc Af Amer: >60 mL/min (ref >60)
GFR calc non Af Amer: >60 mL/min (ref >60)
GLUCOSE: 103 mg/dL — AB (ref 65–99)
Potassium: 3.7 mmol/L (ref 3.5–5.1)
SODIUM: 138 mmol/L (ref 135–145)
Total Protein: 8 g/dL (ref 6.5–8.1)

## 2017-01-19 LAB — CBC
HCT: 40.1 % (ref 36.0–46.0)
Hemoglobin: 14.5 g/dL (ref 12.0–15.0)
MCH: 34.1 pg — AB (ref 26.0–34.0)
MCHC: 36.2 g/dL — ABNORMAL HIGH (ref 30.0–36.0)
MCV: 94.4 fL (ref 78.0–100.0)
PLATELETS: 251 10*3/uL (ref 150–400)
RBC: 4.25 MIL/uL (ref 3.87–5.11)
RDW: 14.9 % (ref 11.5–15.5)
WBC: 5.2 10*3/uL (ref 4.0–10.5)

## 2017-01-19 LAB — I-STAT BETA HCG BLOOD, ED (MC, WL, AP ONLY)

## 2017-01-19 LAB — LIPASE, BLOOD: Lipase: 19 U/L (ref 11–51)

## 2017-01-19 MED ORDER — ONDANSETRON 4 MG PO TBDP: 4.0000 mg | ORAL_TABLET | Freq: Once | ORAL | Status: DC | PRN

## 2017-01-19 NOTE — ED Triage Notes (Signed)
Patient reports since chemo last week she hasnt been feeling well and having n/v since this am.  Patient also c/o rash on face x month.

## 2017-01-19 NOTE — ED Triage Notes (Signed)
Pt requested to wait in the lobby. Pt was given a mask.

## 2017-01-19 NOTE — ED Notes (Signed)
Did not respond

## 2017-01-19 NOTE — ED Notes (Signed)
Pt did not answer.

## 2017-01-21 ENCOUNTER — Telehealth: Payer: Self-pay | Admitting: *Deleted

## 2017-01-21 ENCOUNTER — Other Ambulatory Visit: Payer: Self-pay | Admitting: Hematology and Oncology

## 2017-01-21 ENCOUNTER — Telehealth: Payer: Self-pay

## 2017-01-21 MED ORDER — DICYCLOMINE HCL 20 MG PO TABS: 20.0000 mg | ORAL_TABLET | Freq: Two times a day (BID) | ORAL | 0 refills | Status: DC | PRN

## 2017-01-21 NOTE — Telephone Encounter (Signed)
I am not aware bentyl is a prescription medicine and if it is, OK to refill Please ask her if she wants to come in for IVF Otherwise, just advise hydration at home  I will make adjustment to anti-emetics in the future

## 2017-01-21 NOTE — Telephone Encounter (Signed)
Pt states she is doing better. Has been drinking fluids without problems

## 2017-01-21 NOTE — Telephone Encounter (Signed)
Chemo last Wednesday 9/19. Since then every time she eats she has vomiting or has runny BM. She has ondansetron 8 mg q8hr prn. Just started using zofran  yesterday b/c she forgot she had them. The zofran is helping the nausea. Stomach ache 0400 this am almost went to ER. Last BM 0500 was runny. No stomach ache at present. Instructed her to use zofran q 8 hr today regardless if nauseated then go back to prn.   She did go to ED on 9/25 at 1300 for n/v, was not called back to treatment area until 1800 at which time she was gone.  Also asking for  refill on bentyl for stomach spasms.This was rx by [facility] ED Dr Leonette Monarch on 12/09/16.

## 2017-01-25 ENCOUNTER — Other Ambulatory Visit: Payer: Self-pay | Admitting: Infectious Diseases

## 2017-01-25 DIAGNOSIS — B20 Human immunodeficiency virus [HIV] disease: Secondary | ICD-10-CM

## 2017-01-26 ENCOUNTER — Other Ambulatory Visit: Payer: Self-pay | Admitting: Internal Medicine

## 2017-01-26 ENCOUNTER — Telehealth: Payer: Self-pay | Admitting: *Deleted

## 2017-01-26 ENCOUNTER — Other Ambulatory Visit: Payer: Self-pay | Admitting: *Deleted

## 2017-01-26 MED ORDER — MEGESTROL ACETATE 625 MG/5ML PO SUSP: 625.0000 mg | Freq: Every day | ORAL | 3 refills | Status: DC

## 2017-01-26 NOTE — Telephone Encounter (Signed)
Patient notified that Dr. Linus Salmons switched the Marinol to Megace. Insurance wanted her to try Megace first. Rx sent to [facility].

## 2017-02-03 ENCOUNTER — Ambulatory Visit: Payer: Medicare Other

## 2017-02-03 ENCOUNTER — Ambulatory Visit (HOSPITAL_BASED_OUTPATIENT_CLINIC_OR_DEPARTMENT_OTHER): Payer: 59 | Admitting: Hematology and Oncology

## 2017-02-03 ENCOUNTER — Other Ambulatory Visit (HOSPITAL_COMMUNITY)
Admission: AD | Admit: 2017-02-03 | Discharge: 2017-02-03 | Disposition: A | Discharge status: Home / Self Care | Payer: 59 | Source: Ambulatory Visit | Attending: Hematology and Oncology | Admitting: Hematology and Oncology

## 2017-02-03 ENCOUNTER — Ambulatory Visit (HOSPITAL_BASED_OUTPATIENT_CLINIC_OR_DEPARTMENT_OTHER): Payer: 59

## 2017-02-03 ENCOUNTER — Other Ambulatory Visit (HOSPITAL_BASED_OUTPATIENT_CLINIC_OR_DEPARTMENT_OTHER): Payer: 59

## 2017-02-03 ENCOUNTER — Encounter: Payer: Self-pay | Admitting: Hematology and Oncology

## 2017-02-03 ENCOUNTER — Telehealth: Payer: Self-pay | Admitting: Hematology and Oncology

## 2017-02-03 DIAGNOSIS — C8118 Nodular sclerosis classical Hodgkin lymphoma, lymph nodes of multiple sites: Secondary | ICD-10-CM

## 2017-02-03 DIAGNOSIS — B20 Human immunodeficiency virus [HIV] disease: Secondary | ICD-10-CM | POA: Diagnosis not present

## 2017-02-03 DIAGNOSIS — M25531 Pain in right wrist: Secondary | ICD-10-CM

## 2017-02-03 DIAGNOSIS — Z5112 Encounter for antineoplastic immunotherapy: Secondary | ICD-10-CM | POA: Diagnosis not present

## 2017-02-03 DIAGNOSIS — Z720 Tobacco use: Secondary | ICD-10-CM

## 2017-02-03 DIAGNOSIS — G8929 Other chronic pain: Secondary | ICD-10-CM | POA: Diagnosis not present

## 2017-02-03 DIAGNOSIS — F17210 Nicotine dependence, cigarettes, uncomplicated: Secondary | ICD-10-CM

## 2017-02-03 LAB — CBC WITH DIFFERENTIAL/PLATELET
BASO%: 0.2 % (ref 0.0–2.0)
BASOS ABS: 0 10*3/uL (ref 0.0–0.1)
EOS ABS: 0 10*3/uL (ref 0.0–0.5)
EOS%: 0 % (ref 0.0–7.0)
HEMATOCRIT: 39.1 % (ref 34.8–46.6)
HEMOGLOBIN: 13.4 g/dL (ref 11.6–15.9)
LYMPH#: 1.4 10*3/uL (ref 0.9–3.3)
LYMPH%: 22.3 % (ref 14.0–49.7)
MCH: 34.3 pg — ABNORMAL HIGH (ref 25.1–34.0)
MCHC: 34.3 g/dL (ref 31.5–36.0)
MCV: 100 fL (ref 79.5–101.0)
MONO#: 0.9 10*3/uL (ref 0.1–0.9)
MONO%: 14.6 % — ABNORMAL HIGH (ref 0.0–14.0)
NEUT#: 4 10*3/uL (ref 1.5–6.5)
NEUT%: 62.9 % (ref 38.4–76.8)
Platelets: 309 10*3/uL (ref 145–400)
RBC: 3.91 10*6/uL (ref 3.70–5.45)
RDW: 16.7 % — AB (ref 11.2–14.5)
WBC: 6.3 10*3/uL (ref 3.9–10.3)

## 2017-02-03 LAB — COMPREHENSIVE METABOLIC PANEL
ALT: 20 U/L (ref 0–55)
AST: 17 U/L (ref 5–34)
Albumin: 4.2 g/dL (ref 3.5–5.0)
Alkaline Phosphatase: 65 U/L (ref 40–150)
Anion Gap: 10 mEq/L (ref 3–11)
BILIRUBIN TOTAL: 0.67 mg/dL (ref 0.20–1.20)
BUN: 14.7 mg/dL (ref 7.0–26.0)
CHLORIDE: 108 meq/L (ref 98–109)
CO2: 23 mEq/L (ref 22–29)
CREATININE: 0.9 mg/dL (ref 0.6–1.1)
Calcium: 10 mg/dL (ref 8.4–10.4)
GLUCOSE: 110 mg/dL (ref 70–140)
Potassium: 3.7 mEq/L (ref 3.5–5.1)
SODIUM: 141 meq/L (ref 136–145)
TOTAL PROTEIN: 7.6 g/dL (ref 6.4–8.3)

## 2017-02-03 LAB — PREGNANCY, URINE: Preg Test, Ur: NEGATIVE

## 2017-02-03 MED ORDER — DEXAMETHASONE SODIUM PHOSPHATE 10 MG/ML IJ SOLN
INTRAMUSCULAR | Status: AC
  Filled 2017-02-03: qty 1

## 2017-02-03 MED ORDER — SODIUM CHLORIDE 0.9% FLUSH
10.0000 mL | INTRAVENOUS | Status: DC | PRN
  Administered 2017-02-03: 10 mL
  Filled 2017-02-03: qty 10

## 2017-02-03 MED ORDER — DIPHENHYDRAMINE HCL 50 MG/ML IJ SOLN
50.0000 mg | Freq: Once | INTRAMUSCULAR | Status: AC
  Administered 2017-02-03: 50 mg via INTRAVENOUS

## 2017-02-03 MED ORDER — DIPHENHYDRAMINE HCL 50 MG/ML IJ SOLN
INTRAMUSCULAR | Status: AC
  Filled 2017-02-03: qty 1

## 2017-02-03 MED ORDER — ACETAMINOPHEN 325 MG PO TABS
ORAL_TABLET | ORAL | Status: AC
  Filled 2017-02-03: qty 2

## 2017-02-03 MED ORDER — SODIUM CHLORIDE 0.9 % IV SOLN
Freq: Once | INTRAVENOUS | Status: AC
  Administered 2017-02-03: 10:00:00 via INTRAVENOUS

## 2017-02-03 MED ORDER — PALONOSETRON HCL INJECTION 0.25 MG/5ML
INTRAVENOUS | Status: AC
  Filled 2017-02-03: qty 5

## 2017-02-03 MED ORDER — BRENTUXIMAB VEDOTIN 50 MG IV SOLR
1.8000 mg/kg | Freq: Once | INTRAVENOUS | Status: AC
  Administered 2017-02-03: 90 mg via INTRAVENOUS
  Filled 2017-02-03: qty 18

## 2017-02-03 MED ORDER — HEPARIN SOD (PORK) LOCK FLUSH 100 UNIT/ML IV SOLN
500.0000 [IU] | Freq: Once | INTRAVENOUS | Status: AC | PRN
  Administered 2017-02-03: 500 [IU]
  Filled 2017-02-03: qty 5

## 2017-02-03 MED ORDER — ACETAMINOPHEN 325 MG PO TABS
650.0000 mg | ORAL_TABLET | Freq: Once | ORAL | Status: AC
  Administered 2017-02-03: 650 mg via ORAL

## 2017-02-03 MED ORDER — DEXAMETHASONE SODIUM PHOSPHATE 10 MG/ML IJ SOLN
10.0000 mg | Freq: Once | INTRAMUSCULAR | Status: AC
  Administered 2017-02-03: 10 mg via INTRAVENOUS

## 2017-02-03 MED ORDER — TRAMADOL HCL 50 MG PO TABS: 50.0000 mg | ORAL_TABLET | Freq: Four times a day (QID) | ORAL | 0 refills | Status: DC | PRN

## 2017-02-03 MED ORDER — PALONOSETRON HCL INJECTION 0.25 MG/5ML
0.2500 mg | Freq: Once | INTRAVENOUS | Status: AC
  Administered 2017-02-03: 0.25 mg via INTRAVENOUS

## 2017-02-03 NOTE — Assessment & Plan Note (Addendum)
She has chronic wrist pain and diffuse musculoskeletal pain of unknown etiology. Examination is benign. I recommend vitamin D supplement. I refilled her prescription tramadol today.

## 2017-02-03 NOTE — Assessment & Plan Note (Signed)
I reviewed the August PET CT scan with the patient There is nothing definitive to suggest evidence of cancer, except for persistent activity within the lesion in L3 and the left sacrum Prior bone biopsy did not reveal cancer within her bone marrow She is not symptomatic After recent discussion at the hematology tumor board, recommendation would be to not pursue further workup at this point My inclination would be to proceed with further Brentuximab every 3 weeks as scheduled indefinitely and plan to repeat PET CT scan in November 2018.  The patient agree with the plan of care. She will get Lupron injection every 4 weeks to suppress ovarian function

## 2017-02-03 NOTE — Telephone Encounter (Signed)
Gave avs and calendar for October and November  °

## 2017-02-03 NOTE — Patient Instructions (Signed)
[facility] Discharge Instructions for Patients Receiving Chemotherapy  Today you received the following chemotherapy agents adcetris   To help prevent nausea and vomiting after your treatment, we encourage you to take your nausea medication as directed  If you develop nausea and vomiting that is not controlled by your nausea medication, call the clinic.   BELOW ARE SYMPTOMS THAT SHOULD BE REPORTED IMMEDIATELY:  *FEVER GREATER THAN 100.5 F  *CHILLS WITH OR WITHOUT FEVER  NAUSEA AND VOMITING THAT IS NOT CONTROLLED WITH YOUR NAUSEA MEDICATION  *UNUSUAL SHORTNESS OF BREATH  *UNUSUAL BRUISING OR BLEEDING  TENDERNESS IN MOUTH AND THROAT WITH OR WITHOUT PRESENCE OF ULCERS  *URINARY PROBLEMS  *BOWEL PROBLEMS  UNUSUAL RASH Items with * indicate a potential emergency and should be followed up as soon as possible.  Feel free to call the clinic you have any questions or concerns. The clinic phone number is [phone].

## 2017-02-03 NOTE — Patient Instructions (Signed)
Implanted Port Home Guide An implanted port is a type of central line that is placed under the skin. Central lines are used to provide IV access when treatment or nutrition needs to be given through a person's veins. Implanted ports are used for long-term IV access. An implanted port may be placed because:  You need IV medicine that would be irritating to the small veins in your hands or arms.  You need long-term IV medicines, such as antibiotics.  You need IV nutrition for a long period.  You need frequent blood draws for lab tests.  You need dialysis.  Implanted ports are usually placed in the chest area, but they can also be placed in the upper arm, the abdomen, or the leg. An implanted port has two main parts:  Reservoir. The reservoir is round and will appear as a small, raised area under your skin. The reservoir is the part where a needle is inserted to give medicines or draw blood.  Catheter. The catheter is a thin, flexible tube that extends from the reservoir. The catheter is placed into a large vein. Medicine that is inserted into the reservoir goes into the catheter and then into the vein.  How will I care for my incision site? Do not get the incision site wet. Bathe or shower as directed by your health care provider. How is my port accessed? Special steps must be taken to access the port:  Before the port is accessed, a numbing cream can be placed on the skin. This helps numb the skin over the port site.  Your health care provider uses a sterile technique to access the port. ? Your health care provider must put on a mask and sterile gloves. ? The skin over your port is cleaned carefully with an antiseptic and allowed to dry. ? The port is gently pinched between sterile gloves, and a needle is inserted into the port.  Only "non-coring" port needles should be used to access the port. Once the port is accessed, a blood return should be checked. This helps ensure that the port  is in the vein and is not clogged.  If your port needs to remain accessed for a constant infusion, a clear (transparent) bandage will be placed over the needle site. The bandage and needle will need to be changed every week, or as directed by your health care provider.  Keep the bandage covering the needle clean and dry. Do not get it wet. Follow your health care provider's instructions on how to take a shower or bath while the port is accessed.  If your port does not need to stay accessed, no bandage is needed over the port.  What is flushing? Flushing helps keep the port from getting clogged. Follow your health care provider's instructions on how and when to flush the port. Ports are usually flushed with saline solution or a medicine called heparin. The need for flushing will depend on how the port is used.  If the port is used for intermittent medicines or blood draws, the port will need to be flushed: ? After medicines have been given. ? After blood has been drawn. ? As part of routine maintenance.  If a constant infusion is running, the port may not need to be flushed.  How long will my port stay implanted? The port can stay in for as long as your health care provider thinks it is needed. When it is time for the port to come out, surgery will be   done to remove it. The procedure is similar to the one performed when the port was put in. When should I seek immediate medical care? When you have an implanted port, you should seek immediate medical care if:  You notice a bad smell coming from the incision site.  You have swelling, redness, or drainage at the incision site.  You have more swelling or pain at the port site or the surrounding area.  You have a fever that is not controlled with medicine.  This information is not intended to replace advice given to you by your health care provider. Make sure you discuss any questions you have with your health care provider. Document  Released: 04/13/2005 Document Revised: 09/19/2015 Document Reviewed: 12/19/2012 Elsevier Interactive Patient Education  2017 [employer]  

## 2017-02-03 NOTE — Assessment & Plan Note (Addendum)
Her last HIV viral load was mildly detected She will continue anti-retroviral treatment as directed

## 2017-02-03 NOTE — Progress Notes (Signed)
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      05/07/2014 Imaging    CT scan of the chest show right thoracic inlet lymphadenopathy      06/07/2014 Procedure    She underwent ultrasound-guided core biopsy of the neck lymph node      06/07/2014 Pathology Results    Accession: [id number] biopsy confirmed diagnosis of Hodgkin lymphoma.      06/15/2014 Imaging    Echocardiogram showed preserved ejection fraction      07/09/2014 - 07/12/2014 Hospital Admission    She was admitted to the hospital for severe anemia.      07/27/2014 Procedure    She had placement of port      07/31/2014 - 09/11/2014 Chemotherapy    She received dose adjusted chemotherapy due to abnormal liver function tests and severe anemia. Treatment was delayed due to noncompliance  and subsequently stopped because the patient failed to keep appointments      01/11/2015 Imaging    Repeat PET CT scan showed response to treatment      01/28/2015 - 06/18/2015 Chemotherapy    ABVD was restarted with full dose.      02/08/2015 - 02/10/2015 Hospital Admission    The patient was admitted to the hospital due to pancytopenia and profuse diarrhea. Cultures were negative. She was placed on ciprofloxacin.      02/11/2015 Adverse Reaction    Treatment was placed on hold due to recent infection.      04/11/2015 Imaging    PET CT scan showed near complete response. Incidental finding of an abnormal bone  lesion, indeterminate. She is not symptomatic. Recommendation from Hem [facility] to observe      07/11/2015 Imaging    PET CT scan showed abnormal new bone lesions, suggestive of possible disease progression      07/23/2015 Bone Marrow Biopsy    She underwent bone biopsy      07/23/2015 Pathology Results    Accession: [id number]  biopsy was negative for cancer      11/21/2015 Surgery    She had surgery for ectopic pregnancy      01/29/2016 Imaging    Ct chest, abdomen and pelvis showed pelvic and retroperitoneal lymphadenopathy, as above, concerning for residual disease. There is also a mildly enlarged posterior mediastinal lymph node measuring 11 mm adjacent to the distal descending thoracic aorta. This may represent an additional focus of disease, but is the only finding of concern in the thorax on today's examination. Sclerosis in the right ilium at site of previously noted metabolically active lesion, grossly unchanged. No other definite osseous lesions are identified on today's examination. Spleen is normal in size and appearance.      02/14/2016 PET scan    Interval disease worsening with new foci of hypermetabolic activity in multiple retroperitoneal and pelvic lymph nodes as well as AP window and left hilar lymph nodes. (Deauville 5). There is also overall worsening of the osseous disease.      05/11/2016 Pathology Results  Diagnosis Lymph node, needle/core biopsy, Left para-aortic retroperitoneal - CLASSICAL HODGKIN LYMPHOMA. - SEE ONCOLOGY TABLE. Microscopic Comment LYMPHOMA Histologic type: Classical Hodgkin lymphoma. Grade (if applicable): N/A Flow cytometry: Not done. Immunohistochemical stains: CD15, CD20, CD3, LCA, PAX-5, CD30 with appropriate controls. Touch preps/imprints: Not performed. Comments: The sections show small needle core biopsy fragments displaying a polymorphous cellular proliferation of small lymphocytes, plasma cells, eosinophils, and large atypical  mononuclear and multilobated lymphoid cells with features of Reed-Sternberg cells and variants. This is associated with patchy areas of fibrosis. Immunohistochemical stains were performed and show that the large atypical lymphoid cells are positive for CD30, CD15 and PAX-5 and negative for LCA, CD20, CD3. The small lymphoid cells in the background show a mixture of T and B cells with predominance of T cells. The overall morphologic and histologic features are consistent with classical Hodgkin lymphoma. Further subtyping is challenging in limited small biopsy fragments but the patchy fibrosis suggests nodular sclerosis type.       05/11/2016 Procedure    She underwent CT guided biopsy of retroperitoneal lymph node      05/26/2016 Procedure    Successful placement of a right IJ approach Power Port with ultrasound and fluoroscopic guidance. The catheter is ready for use.      06/02/2016 PET scan    Mixed response to chemotherapy with some lymph nodes decreased in metabolic activity and some lymph nodes increase metabolic activity. Lymph node stations including mediastinum, periaortic retroperitoneum, and obturator node stations. Activity is remains relatively intense Deauville 4 2. LEFT infrahilar nodule / lymph node with intense metabolic activity decreased from prior. ( Deauville 4 ). 3. 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 Deauville 5      06/03/2016 - 06/05/2016 Hospital Admission    She was admitted to the hospital for cycle 1 of ICE chemotherapy      06/24/2016 - 06/26/2016 Hospital Admission    She received cycle 2 of ICE chemo      07/07/2016 PET scan    Resolution of prior hypermetabolic adenopathy and resolution of prior osseous foci of hypermetabolic activity compatible with essentially complete response to therapy (Deauville 1). 2. Generalized reduced activity in the L4 vertebral body and the type of finding which would typically reflect prior radiation  therapy. 3. Stable septated fatty right pelvic lesion, possibly a dermoid or lipoma, not hypermetabolic      2/95/1884 PET scan    Hypermetabolic lesion along the L3 vertebral body and left posterior elements, max SUV 7.8 (Deauville 5). Hypermetabolic lesion along the left inferior pubic ramus, max SUV 4.9 (Deauville 4).  IMPRESSION: Prevascular lymphadenopathy, reflecting nodal recurrence (Deauville 4). Hypermetabolic osseous metastases involving the L3 vertebral body/posterior elements and left inferior pubic ramus (Deauville 4-5). Hypermetabolism along the endometrium, new, possibly reactive/physiologic. Consider pelvic ultrasound and/or endometrial sampling as clinically warranted.      11/04/2016 -  Chemotherapy    She received Brentuximab      12/11/2016 PET scan    1. Mixed response to therapy within the skeleton. Lesions at L3 is decreased in size and metabolic activity. Residual activity remains above liver ( Deauville 4) 2. Increased activity in the LEFT sacrum with metabolic activity above liver activity ( Deauville 4 3. Decrease in size and metabolic activity of anterior mediastinal tissue consistent with resolution of thymic hyperplasia or resolution of lymphoma metabolic activity ( Deauville 2). 4. No new lymphadenopathy. Normal spleen and liver.       INTERVAL HISTORY: Please  see below for problem oriented charting. She returns for cycle 5 of treatment She denies peripheral neuropathy No bone pain except in her wrist No new lymphadenopathy Denies recent infection She continued to smoke cigar  REVIEW OF SYSTEMS:   Constitutional: Denies fevers, chills or abnormal weight loss Eyes: Denies blurriness of vision Ears, nose, mouth, throat, and face: Denies mucositis or sore throat Respiratory: Denies cough, dyspnea or wheezes Cardiovascular: Denies palpitation, chest discomfort or lower extremity swelling Gastrointestinal:  Denies nausea, heartburn or change in bowel  habits Skin: Denies abnormal skin rashes Lymphatics: Denies new lymphadenopathy or easy bruising Neurological:Denies numbness, tingling or new weaknesses Behavioral/Psych: Mood is stable, no new changes  All other systems were reviewed with the patient and are negative.  I have reviewed the past medical history, past surgical history, social history and family history with the patient and they are unchanged from previous note.  ALLERGIES:  is allergic to temazepam.  MEDICATIONS:  Current Outpatient Prescriptions  Medication Sig Dispense Refill  . atazanavir (REYATAZ) 300 MG capsule Take 1 capsule (300 mg total) by mouth daily with breakfast. Take with Norvir. 30 capsule 5  . cholecalciferol (VITAMIN D) 1000 units tablet Take 1 tablet (1,000 Units total) by mouth daily. (Patient not taking: Reported on 11/11/2016) 30 tablet 9  . dicyclomine (BENTYL) 20 MG tablet Take 1 tablet (20 mg total) by mouth 2 (two) times daily as needed for spasms. 20 tablet 0  . dronabinol (MARINOL) 5 MG capsule Take 1 capsule (5 mg total) by mouth 2 (two) times daily before a meal. 60 capsule 5  . hydrOXYzine (ATARAX/VISTARIL) 50 MG tablet Take 1 tablet (50 mg total) by mouth 3 (three) times daily as needed for itching. 30 tablet 2  . megestrol (MEGACE ES) 625 MG/5ML suspension Take 5 mLs (625 mg total) by mouth daily. 150 mL 3  . nicotine (NICODERM CQ - DOSED IN MG/24 HR) 7 mg/24hr patch Place 1 patch (7 mg total) onto the skin daily. 28 patch 0  . NORVIR 100 MG TABS tablet TAKE 1 TABLET BY MOUTH EVERY MORNING WITH BREAKFAST. TAKE WITH REYATAZ 30 tablet 6  . polyethylene glycol powder (MIRALAX) powder Take 1 capful 3 times a day. Slowly cut back as needed until you have normal bowel movements 255 g 0  . Polyvinyl Alcohol-Povidone (CLEAR EYES ALL SEASONS OP) Place 1 drop into both eyes daily.    . traMADol (ULTRAM) 50 MG tablet Take 1 tablet (50 mg total) by mouth every 6 (six) hours as needed. 90 tablet 0  . TRUVADA  200-300 MG tablet TAKE 1 TABLET BY MOUTH EVERY DAY 30 tablet 2   No current facility-administered medications for this visit.     PHYSICAL EXAMINATION: ECOG PERFORMANCE STATUS: 1 - Symptomatic but completely ambulatory  Vitals:   02/03/17 0905  BP: 112/78  Pulse: 64  Resp: 18  Temp: 98.3 F (36.8 C)   Filed Weights   02/03/17 0905  Weight: 101 lb 12.8 oz (46.2 kg)    GENERAL:alert, no distress and comfortable SKIN: skin color, texture, turgor are normal, no rashes or significant lesions EYES: normal, Conjunctiva are pink and non-injected, sclera clear OROPHARYNX:no exudate, no erythema and lips, buccal mucosa, and tongue normal  NECK: supple, thyroid normal size, non-tender, without nodularity LYMPH:  no palpable lymphadenopathy in the cervical, axillary or inguinal LUNGS: clear to auscultation and percussion with normal breathing effort HEART: regular rate & rhythm and no murmurs and no lower extremity edema ABDOMEN:abdomen soft,  non-tender and normal bowel sounds Musculoskeletal:no cyanosis of digits and no clubbing  NEURO: alert & oriented x 3 with fluent speech, no focal motor/sensory deficits  LABORATORY DATA:  I have reviewed the data as listed    Component Value Date/Time   NA 141 02/03/2017 0814   K 3.7 02/03/2017 0814   CL 102 01/19/2017 1300   CO2 23 02/03/2017 0814   GLUCOSE 110 02/03/2017 0814   BUN 14.7 02/03/2017 0814   CREATININE 0.9 02/03/2017 0814   CALCIUM 10.0 02/03/2017 0814   PROT 7.6 02/03/2017 0814   ALBUMIN 4.2 02/03/2017 0814   AST 17 02/03/2017 0814   ALT 20 02/03/2017 0814   ALKPHOS 65 02/03/2017 0814   BILITOT 0.67 02/03/2017 0814   GFRNONAA >60 01/19/2017 1300   GFRNONAA 83 02/20/2016 0908   GFRAA >60 01/19/2017 1300   GFRAA >89 02/20/2016 0908    No results found for: SPEP, UPEP  Lab Results  Component Value Date   WBC 6.3 02/03/2017   NEUTROABS 4.0 02/03/2017   HGB 13.4 02/03/2017   HCT 39.1 02/03/2017   MCV 100.0  02/03/2017   PLT 309 02/03/2017      Chemistry      Component Value Date/Time   NA 141 02/03/2017 0814   K 3.7 02/03/2017 0814   CL 102 01/19/2017 1300   CO2 23 02/03/2017 0814   BUN 14.7 02/03/2017 0814   CREATININE 0.9 02/03/2017 0814      Component Value Date/Time   CALCIUM 10.0 02/03/2017 0814   ALKPHOS 65 02/03/2017 0814   AST 17 02/03/2017 0814   ALT 20 02/03/2017 0814   BILITOT 0.67 02/03/2017 0814     ASSESSMENT & PLAN:  Hodgkin lymphoma, 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  The patient agree with the plan of care. She will get Lupron injection every 4 weeks to suppress ovarian function  Cigarette smoker We discussed the importance of nicotine cessation She is not willing to quit  Human immunodeficiency virus (HIV) disease ([place]) Her last HIV viral load was mildly detected She will continue anti-retroviral treatment as directed  Chronic pain of right wrist She has chronic wrist pain and diffuse musculoskeletal pain of unknown etiology. Examination is benign. I recommend vitamin D supplement. I refilled her prescription tramadol today.   No orders of the defined types were placed in this encounter.  All questions were answered. The patient knows to call the clinic with any problems, questions or concerns. No barriers to learning was detected. I spent 15 minutes counseling the patient face to face. The total time spent in the appointment was  20 minutes and more than 50% was on counseling and review of test results     Heath Lark, MD 02/03/2017 9:38 AM

## 2017-02-03 NOTE — Assessment & Plan Note (Signed)
We discussed the importance of nicotine cessation She is not willing to quit

## 2017-02-06 ENCOUNTER — Encounter (HOSPITAL_COMMUNITY): Payer: Self-pay

## 2017-02-06 ENCOUNTER — Emergency Department (HOSPITAL_COMMUNITY)
Admission: EM | Admit: 2017-02-06 | Discharge: 2017-02-06 | Disposition: A | Discharge status: Home / Self Care | Payer: 59 | Attending: Emergency Medicine | Admitting: Emergency Medicine

## 2017-02-06 DIAGNOSIS — F17210 Nicotine dependence, cigarettes, uncomplicated: Secondary | ICD-10-CM | POA: Diagnosis not present

## 2017-02-06 DIAGNOSIS — C8192 Hodgkin lymphoma, unspecified, intrathoracic lymph nodes: Secondary | ICD-10-CM | POA: Insufficient documentation

## 2017-02-06 DIAGNOSIS — B20 Human immunodeficiency virus [HIV] disease: Secondary | ICD-10-CM | POA: Insufficient documentation

## 2017-02-06 DIAGNOSIS — R112 Nausea with vomiting, unspecified: Secondary | ICD-10-CM | POA: Diagnosis not present

## 2017-02-06 DIAGNOSIS — Z79899 Other long term (current) drug therapy: Secondary | ICD-10-CM | POA: Diagnosis not present

## 2017-02-06 DIAGNOSIS — R197 Diarrhea, unspecified: Secondary | ICD-10-CM | POA: Insufficient documentation

## 2017-02-06 DIAGNOSIS — Z9221 Personal history of antineoplastic chemotherapy: Secondary | ICD-10-CM | POA: Insufficient documentation

## 2017-02-06 DIAGNOSIS — R1111 Vomiting without nausea: Secondary | ICD-10-CM | POA: Diagnosis present

## 2017-02-06 LAB — URINALYSIS, ROUTINE W REFLEX MICROSCOPIC
BILIRUBIN URINE: NEGATIVE
Glucose, UA: NEGATIVE mg/dL
Hgb urine dipstick: NEGATIVE
KETONES UR: NEGATIVE mg/dL
Leukocytes, UA: NEGATIVE
NITRITE: NEGATIVE
Protein, ur: NEGATIVE mg/dL
Specific Gravity, Urine: 1.024 (ref 1.005–1.030)
pH: 5 (ref 5.0–8.0)

## 2017-02-06 LAB — LIPASE, BLOOD: LIPASE: 17 U/L (ref 11–51)

## 2017-02-06 LAB — COMPREHENSIVE METABOLIC PANEL
ALBUMIN: 3.9 g/dL (ref 3.5–5.0)
ALK PHOS: 55 U/L (ref 38–126)
ALT: 28 U/L (ref 14–54)
AST: 21 U/L (ref 15–41)
Anion gap: 10 (ref 5–15)
BUN: 17 mg/dL (ref 6–20)
CALCIUM: 9.1 mg/dL (ref 8.9–10.3)
CHLORIDE: 103 mmol/L (ref 101–111)
CO2: 24 mmol/L (ref 22–32)
CREATININE: 0.78 mg/dL (ref 0.44–1.00)
GFR calc Af Amer: >60 mL/min (ref >60)
GFR calc non Af Amer: >60 mL/min (ref >60)
GLUCOSE: 96 mg/dL (ref 65–99)
Potassium: 3.5 mmol/L (ref 3.5–5.1)
Sodium: 137 mmol/L (ref 135–145)
Total Bilirubin: 1.9 mg/dL — ABNORMAL HIGH (ref 0.3–1.2)
Total Protein: 7.1 g/dL (ref 6.5–8.1)

## 2017-02-06 LAB — CBC WITH DIFFERENTIAL/PLATELET
BASOS PCT: 0 %
Basophils Absolute: 0 10*3/uL (ref 0.0–0.1)
EOS ABS: 0 10*3/uL (ref 0.0–0.7)
Eosinophils Relative: 0 %
HCT: 39 % (ref 36.0–46.0)
Hemoglobin: 14 g/dL (ref 12.0–15.0)
Lymphocytes Relative: 12 %
Lymphs Abs: 1.2 10*3/uL (ref 0.7–4.0)
MCH: 35.1 pg — ABNORMAL HIGH (ref 26.0–34.0)
MCHC: 35.9 g/dL (ref 30.0–36.0)
MCV: 97.7 fL (ref 78.0–100.0)
MONO ABS: 0.9 10*3/uL (ref 0.1–1.0)
MONOS PCT: 9 %
Neutro Abs: 8.1 10*3/uL — ABNORMAL HIGH (ref 1.7–7.7)
Neutrophils Relative %: 79 %
Platelets: 294 10*3/uL (ref 150–400)
RBC: 3.99 MIL/uL (ref 3.87–5.11)
RDW: 16.8 % — AB (ref 11.5–15.5)
WBC: 10.2 10*3/uL (ref 4.0–10.5)

## 2017-02-06 LAB — I-STAT BETA HCG BLOOD, ED (MC, WL, AP ONLY)

## 2017-02-06 MED ORDER — ONDANSETRON 4 MG PO TBDP: 4.0000 mg | ORAL_TABLET | Freq: Three times a day (TID) | ORAL | 0 refills | Status: DC | PRN

## 2017-02-06 MED ORDER — SODIUM CHLORIDE 0.9 % IV BOLUS (SEPSIS)
1000.0000 mL | Freq: Once | INTRAVENOUS | Status: AC
  Administered 2017-02-06: 1000 mL via INTRAVENOUS

## 2017-02-06 MED ORDER — ONDANSETRON HCL 4 MG/2ML IJ SOLN
4.0000 mg | Freq: Once | INTRAMUSCULAR | Status: AC
  Administered 2017-02-06: 4 mg via INTRAVENOUS
  Filled 2017-02-06: qty 2
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
## 2017-02-06 NOTE — ED Triage Notes (Signed)
Pt has recently relapsed from remission. Has started chemo again at [facility]. Since chemo has started patient has N/V/D.   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

## 2017-02-06 NOTE — Discharge Instructions (Signed)
Please continue to take your home zofran for nausea/vomiting. Your blood work is reassuring. This is likely side effects related to your chemotherapy agent.  Drink plenty of fluids and get plenty of rest.  Return without fail for worsening symptoms, including fever, severe abdominal pain, intractable vomiting despite nausea medications, confusion, bloody stools or any other symptoms concerning to you.

## 2017-02-06 NOTE — ED Notes (Signed)
Bed: ZS82 Expected date:  Expected time:  Means of arrival:  Comments: EMS- N/V/D-CA pt

## 2017-02-08 ENCOUNTER — Other Ambulatory Visit: Payer: Self-pay | Admitting: Hematology and Oncology

## 2017-02-08 ENCOUNTER — Ambulatory Visit: Payer: Medicare Other

## 2017-02-08 ENCOUNTER — Telehealth: Payer: Self-pay | Admitting: *Deleted

## 2017-02-08 MED ORDER — PREDNISONE 10 MG PO TABS: 10.0000 mg | ORAL_TABLET | Freq: Every day | ORAL | 0 refills | Status: DC

## 2017-02-08 MED ORDER — LOPERAMIDE HCL 2 MG PO CAPS: 2.0000 mg | ORAL_CAPSULE | ORAL | 0 refills | Status: DC | PRN

## 2017-02-08 NOTE — Telephone Encounter (Signed)
Baili states she went to the ED on Saturday. Received IVF. Stomach continues to hurt, was throwing up and is now having diarrhea. Has not taken anything for diarrhea. Has no appetite. Feels that Bentyl is not helping.

## 2017-02-08 NOTE — Telephone Encounter (Signed)
Notified of message below. Has been using zofran.  Will come in today for IVF.  Verbalized understanding of prednisone, zofran and imodium.

## 2017-02-08 NOTE — Telephone Encounter (Signed)
Has she taken any zofran? Zofran will help with nausea and imodium for diarrhea A bit of prednisone may help; you may call in 10 mg daily PO for 30 tabs, no refills Does she need IVF? Can we work her in if needed IF IVF not needed I would recommend prednisone 10 mg daily PO in the morning and zofran 8 mg TID PO for 2-3 days and imodium for diarrhea

## 2017-02-10 ENCOUNTER — Ambulatory Visit (HOSPITAL_BASED_OUTPATIENT_CLINIC_OR_DEPARTMENT_OTHER): Payer: 59

## 2017-02-10 ENCOUNTER — Emergency Department (HOSPITAL_COMMUNITY)
Admission: EM | Admit: 2017-02-10 | Discharge: 2017-02-10 | Disposition: A | Discharge status: Home / Self Care | Payer: 59 | Attending: Emergency Medicine | Admitting: Emergency Medicine

## 2017-02-10 ENCOUNTER — Encounter (HOSPITAL_COMMUNITY): Payer: Self-pay | Admitting: Emergency Medicine

## 2017-02-10 VITALS — BP 124/79 | HR 68 | Temp 98.0°F | Resp 20

## 2017-02-10 DIAGNOSIS — B20 Human immunodeficiency virus [HIV] disease: Secondary | ICD-10-CM | POA: Insufficient documentation

## 2017-02-10 DIAGNOSIS — F17210 Nicotine dependence, cigarettes, uncomplicated: Secondary | ICD-10-CM | POA: Insufficient documentation

## 2017-02-10 DIAGNOSIS — Z95828 Presence of other vascular implants and grafts: Secondary | ICD-10-CM

## 2017-02-10 DIAGNOSIS — B370 Candidal stomatitis: Secondary | ICD-10-CM | POA: Diagnosis not present

## 2017-02-10 DIAGNOSIS — Z79899 Other long term (current) drug therapy: Secondary | ICD-10-CM | POA: Diagnosis not present

## 2017-02-10 DIAGNOSIS — C8198 Hodgkin lymphoma, unspecified, lymph nodes of multiple sites: Secondary | ICD-10-CM | POA: Diagnosis not present

## 2017-02-10 DIAGNOSIS — Z5111 Encounter for antineoplastic chemotherapy: Secondary | ICD-10-CM | POA: Diagnosis not present

## 2017-02-10 DIAGNOSIS — R070 Pain in throat: Secondary | ICD-10-CM | POA: Diagnosis present

## 2017-02-10 DIAGNOSIS — C8118 Nodular sclerosis classical Hodgkin lymphoma, lymph nodes of multiple sites: Secondary | ICD-10-CM

## 2017-02-10 LAB — RAPID STREP SCREEN (MED CTR MEBANE ONLY): Streptococcus, Group A Screen (Direct): NEGATIVE

## 2017-02-10 MED ORDER — NYSTATIN 100000 UNIT/ML MT SUSP: 500000.0000 [IU] | Freq: Four times a day (QID) | OROMUCOSAL | 0 refills | Status: DC

## 2017-02-10 MED ORDER — LEUPROLIDE ACETATE 7.5 MG IM KIT
7.5000 mg | PACK | Freq: Once | INTRAMUSCULAR | Status: AC
  Administered 2017-02-10: 7.5 mg via INTRAMUSCULAR
  Filled 2017-02-10: qty 7.5
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
## 2017-02-10 NOTE — ED Notes (Signed)
Bed: WTR7 Expected date:  Expected time:  Means of arrival:  Comments: 

## 2017-02-10 NOTE — ED Notes (Signed)
Bed: WTR8 Expected date:  Expected time:  Means of arrival:  Comments: 

## 2017-02-10 NOTE — ED Triage Notes (Signed)
Patient complaining of sore throat and throat. Patient states it started yesterday.

## 2017-02-10 NOTE — Patient Instructions (Signed)
Leuprolide depot injection What is this medicine? LEUPROLIDE (loo PROE lide) is a man-made protein that acts like a natural hormone in the body. It decreases testosterone in men and decreases estrogen in women. In men, this medicine is used to treat advanced prostate cancer. In women, some forms of this medicine may be used to treat endometriosis, uterine fibroids, or other female hormone-related problems. This medicine may be used for other purposes; ask your health care provider or pharmacist if you have questions. COMMON BRAND NAME(S): Eligard, Lupron Depot, Lupron Depot-Ped, Viadur What should I tell my health care provider before I take this medicine? They need to know if you have any of these conditions: -diabetes -heart disease or previous heart attack -high blood pressure -high cholesterol -mental illness -osteoporosis -pain or difficulty passing urine -seizures -spinal cord metastasis -stroke -suicidal thoughts, plans, or attempt; a previous suicide attempt by you or a family member -tobacco smoker -unusual vaginal bleeding (women) -an unusual or allergic reaction to leuprolide, benzyl alcohol, other medicines, foods, dyes, or preservatives -pregnant or trying to get pregnant -breast-feeding How should I use this medicine? This medicine is for injection into a muscle or for injection under the skin. It is given by a health care professional in a hospital or clinic setting. The specific product will determine how it will be given to you. Make sure you understand which product you receive and how often you will receive it. Talk to your pediatrician regarding the use of this medicine in children. Special care may be needed. Overdosage: If you think you have taken too much of this medicine contact a poison control center or emergency room at once. NOTE: This medicine is only for you. Do not share this medicine with others. What if I miss a dose? It is important not to miss a dose.  Call your doctor or health care professional if you are unable to keep an appointment. Depot injections: Depot injections are given either once-monthly, every 12 weeks, every 16 weeks, or every 24 weeks depending on the product you are prescribed. The product you are prescribed will be based on if you are female or female, and your condition. Make sure you understand your product and dosing. What may interact with this medicine? Do not take this medicine with any of the following medications: -chasteberry This medicine may also interact with the following medications: -herbal or dietary supplements, like black cohosh or DHEA -female hormones, like estrogens or progestins and birth control pills, patches, rings, or injections -female hormones, like testosterone This list may not describe all possible interactions. Give your health care provider a list of all the medicines, herbs, non-prescription drugs, or dietary supplements you use. Also tell them if you smoke, drink alcohol, or use illegal drugs. Some items may interact with your medicine. What should I watch for while using this medicine? Visit your doctor or health care professional for regular checks on your progress. During the first weeks of treatment, your symptoms may get worse, but then will improve as you continue your treatment. You may get hot flashes, increased bone pain, increased difficulty passing urine, or an aggravation of nerve symptoms. Discuss these effects with your doctor or health care professional, some of them may improve with continued use of this medicine. Female patients may experience a menstrual cycle or spotting during the first months of therapy with this medicine. If this continues, contact your doctor or health care professional. What side effects may I notice from receiving this medicine? Side   effects that you should report to your doctor or health care professional as soon as possible: -allergic reactions like skin  rash, itching or hives, swelling of the face, lips, or tongue -breathing problems -chest pain -depression or memory disorders -pain in your legs or groin -pain at site where injected or implanted -severe headache -swelling of the feet and legs -visual changes -vomiting Side effects that usually do not require medical attention (report to your doctor or health care professional if they continue or are bothersome): -breast swelling or tenderness -decrease in sex drive or performance -diarrhea -hot flashes -loss of appetite -muscle, joint, or bone pains -nausea -redness or irritation at site where injected or implanted -skin problems or acne This list may not describe all possible side effects. Call your doctor for medical advice about side effects. You may report side effects to FDA at 1-800-FDA-1088. Where should I keep my medicine? This drug is given in a hospital or clinic and will not be stored at home. NOTE: This sheet is a summary. It may not cover all possible information. If you have questions about this medicine, talk to your doctor, pharmacist, or health care provider.  2017 [employer] (2015-09-26 09:45:17)  

## 2017-02-12 LAB — CULTURE, GROUP A STREP (THRC)

## 2017-02-15 ENCOUNTER — Telehealth: Payer: Self-pay

## 2017-02-15 NOTE — Telephone Encounter (Signed)
Called to see how patient is doing, she called the after hours number complaining diarrhea yesterday. She states that she is doing better, had solid BM today. Eating better. Instructed per Dr. Elson Areas may take Imodium  4 mg three times a day. States that she does not feel like she needs to get IV fluids. Instructed to call for problems or concerns.

## 2017-02-16 ENCOUNTER — Emergency Department (HOSPITAL_COMMUNITY)
Admission: EM | Admit: 2017-02-16 | Discharge: 2017-02-16 | Disposition: A | Discharge status: Home / Self Care | Payer: 59 | Attending: Emergency Medicine | Admitting: Emergency Medicine

## 2017-02-16 ENCOUNTER — Encounter (HOSPITAL_COMMUNITY): Payer: Self-pay | Admitting: *Deleted

## 2017-02-16 DIAGNOSIS — F17210 Nicotine dependence, cigarettes, uncomplicated: Secondary | ICD-10-CM | POA: Insufficient documentation

## 2017-02-16 DIAGNOSIS — J029 Acute pharyngitis, unspecified: Secondary | ICD-10-CM

## 2017-02-16 DIAGNOSIS — Z79899 Other long term (current) drug therapy: Secondary | ICD-10-CM | POA: Insufficient documentation

## 2017-02-16 LAB — CBC WITH DIFFERENTIAL/PLATELET
BASOS PCT: 1 %
Band Neutrophils: 0 %
Basophils Absolute: 0 10*3/uL (ref 0.0–0.1)
Blasts: 0 %
EOS ABS: 0 10*3/uL (ref 0.0–0.7)
EOS PCT: 0 %
HCT: 32.7 % — ABNORMAL LOW (ref 36.0–46.0)
Hemoglobin: 11.7 g/dL — ABNORMAL LOW (ref 12.0–15.0)
LYMPHS ABS: 0.9 10*3/uL (ref 0.7–4.0)
Lymphocytes Relative: 60 %
MCH: 34.9 pg — AB (ref 26.0–34.0)
MCHC: 35.8 g/dL (ref 30.0–36.0)
MCV: 97.6 fL (ref 78.0–100.0)
MONO ABS: 0.6 10*3/uL (ref 0.1–1.0)
MONOS PCT: 35 %
MYELOCYTES: 0 %
Metamyelocytes Relative: 0 %
NRBC: 0 /100{WBCs}
Neutro Abs: 0.1 10*3/uL — ABNORMAL LOW (ref 1.7–7.7)
Neutrophils Relative %: 4 %
PLATELETS: 218 10*3/uL (ref 150–400)
Promyelocytes Absolute: 0 %
RBC: 3.35 MIL/uL — AB (ref 3.87–5.11)
RDW: 16.7 % — AB (ref 11.5–15.5)
WBC: 1.6 10*3/uL — AB (ref 4.0–10.5)

## 2017-02-16 LAB — COMPREHENSIVE METABOLIC PANEL
ALK PHOS: 43 U/L (ref 38–126)
ALT: 28 U/L (ref 14–54)
ANION GAP: 8 (ref 5–15)
AST: 16 U/L (ref 15–41)
Albumin: 3.3 g/dL — ABNORMAL LOW (ref 3.5–5.0)
BUN: 11 mg/dL (ref 6–20)
CHLORIDE: 104 mmol/L (ref 101–111)
CO2: 30 mmol/L (ref 22–32)
Calcium: 8.9 mg/dL (ref 8.9–10.3)
Creatinine, Ser: 0.63 mg/dL (ref 0.44–1.00)
GFR calc non Af Amer: >60 mL/min (ref >60)
GLUCOSE: 102 mg/dL — AB (ref 65–99)
POTASSIUM: 3 mmol/L — AB (ref 3.5–5.1)
SODIUM: 142 mmol/L (ref 135–145)
TOTAL PROTEIN: 5.9 g/dL — AB (ref 6.5–8.1)
Total Bilirubin: 0.7 mg/dL (ref 0.3–1.2)

## 2017-02-16 MED ORDER — FLUCONAZOLE 100 MG PO TABS: 200.0000 mg | ORAL_TABLET | Freq: Every day | ORAL | 0 refills | Status: DC

## 2017-02-16 MED ORDER — HYDROCODONE-ACETAMINOPHEN 5-325 MG PO TABS
1.0000 | ORAL_TABLET | Freq: Once | ORAL | Status: AC
  Administered 2017-02-16: 1 via ORAL
  Filled 2017-02-16: qty 1

## 2017-02-16 MED ORDER — FLUCONAZOLE 200 MG PO TABS
200.0000 mg | ORAL_TABLET | Freq: Once | ORAL | Status: AC
  Administered 2017-02-16: 200 mg via ORAL
  Filled 2017-02-16: qty 1
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
## 2017-02-16 NOTE — ED Triage Notes (Signed)
Patient is alert and oriented x4.  She is from home and being seen for dysphagia.  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  Yesterday morning patient states that she woke up and was having difficulty swallowing that did not resolve through the day.  Currently she rates her pain 10 of 10.

## 2017-02-16 NOTE — Discharge Instructions (Signed)
Use Maalox for throat discomfort.  Follow-up with your oncologist as scheduled.  Follow-up with [facility] in 2-3 days.  Call for an appointment and tell them that you are in the emergency department and that we spoke with Dr. Watt Climes and he stated to have you seen in the office in the next 2-3 days.  You can see any of the providers at [facility]

## 2017-02-16 NOTE — ED Notes (Signed)
ED Provider at bedside. 

## 2017-02-22 ENCOUNTER — Telehealth: Payer: Self-pay | Admitting: *Deleted

## 2017-02-22 ENCOUNTER — Encounter: Payer: Self-pay | Admitting: Hematology and Oncology

## 2017-02-22 NOTE — Telephone Encounter (Signed)
Voicemail received forwarded to Development worker, community. "I need help with my gas bill.  Can you please have a financial advisor call me back at [phone]."  Second financial advisor notified as well.  Advisor for this patient returns 02-23-2017.

## 2017-02-22 NOTE — Progress Notes (Signed)
Returned patient's call referred from triage RN.  Called patient to introduce myself as Arboriculturist and asked about bill concern. Patient states her bill is due 11/2. Advised patient to contact company to attempt to set up payment arrangements. Then advised patient to contact Lenise on tomorrow to make an appointment to apply for the [place]. Gave her Lenise's contact name and number to schedule appointment and bring proof of income. Patient verbalized understanding.

## 2017-02-23 ENCOUNTER — Encounter: Payer: Self-pay | Admitting: Hematology and Oncology

## 2017-02-23 NOTE — Progress Notes (Signed)
Returned pt's call regarding obtaining assistance for her gas bill.  Informed pt we wouldn't be able to assist with her gas bill because she has exhausted all of her [place] funds.  She verbalized understanding.

## 2017-02-24 ENCOUNTER — Other Ambulatory Visit (HOSPITAL_COMMUNITY)
Admission: RE | Admit: 2017-02-24 | Discharge: 2017-02-24 | Disposition: A | Discharge status: Home / Self Care | Payer: 59 | Source: Ambulatory Visit | Attending: Hematology and Oncology | Admitting: Hematology and Oncology

## 2017-02-24 ENCOUNTER — Ambulatory Visit (HOSPITAL_BASED_OUTPATIENT_CLINIC_OR_DEPARTMENT_OTHER): Payer: 59 | Admitting: Hematology and Oncology

## 2017-02-24 ENCOUNTER — Other Ambulatory Visit (HOSPITAL_BASED_OUTPATIENT_CLINIC_OR_DEPARTMENT_OTHER): Payer: 59

## 2017-02-24 ENCOUNTER — Encounter: Payer: Self-pay | Admitting: Hematology and Oncology

## 2017-02-24 ENCOUNTER — Ambulatory Visit (HOSPITAL_BASED_OUTPATIENT_CLINIC_OR_DEPARTMENT_OTHER): Payer: 59

## 2017-02-24 ENCOUNTER — Ambulatory Visit: Payer: 59

## 2017-02-24 ENCOUNTER — Ambulatory Visit: Payer: 59 | Admitting: Nutrition

## 2017-02-24 VITALS — BP 116/85 | HR 74 | Temp 98.7°F | Resp 18 | Ht 64.0 in | Wt 99.5 lb

## 2017-02-24 DIAGNOSIS — B20 Human immunodeficiency virus [HIV] disease: Secondary | ICD-10-CM | POA: Diagnosis not present

## 2017-02-24 DIAGNOSIS — C8112 Nodular sclerosis classical Hodgkin lymphoma, intrathoracic lymph nodes: Secondary | ICD-10-CM | POA: Diagnosis not present

## 2017-02-24 DIAGNOSIS — Z5112 Encounter for antineoplastic immunotherapy: Secondary | ICD-10-CM | POA: Diagnosis not present

## 2017-02-24 DIAGNOSIS — C8118 Nodular sclerosis classical Hodgkin lymphoma, lymph nodes of multiple sites: Secondary | ICD-10-CM | POA: Insufficient documentation

## 2017-02-24 DIAGNOSIS — G620 Drug-induced polyneuropathy: Secondary | ICD-10-CM | POA: Diagnosis not present

## 2017-02-24 DIAGNOSIS — T451X5A Adverse effect of antineoplastic and immunosuppressive drugs, initial encounter: Secondary | ICD-10-CM

## 2017-02-24 DIAGNOSIS — R109 Unspecified abdominal pain: Secondary | ICD-10-CM | POA: Diagnosis not present

## 2017-02-24 DIAGNOSIS — G8929 Other chronic pain: Secondary | ICD-10-CM

## 2017-02-24 DIAGNOSIS — R1033 Periumbilical pain: Secondary | ICD-10-CM

## 2017-02-24 DIAGNOSIS — M25531 Pain in right wrist: Secondary | ICD-10-CM | POA: Diagnosis not present

## 2017-02-24 DIAGNOSIS — E559 Vitamin D deficiency, unspecified: Secondary | ICD-10-CM

## 2017-02-24 LAB — COMPREHENSIVE METABOLIC PANEL
ALT: 34 U/L (ref 0–55)
AST: 18 U/L (ref 5–34)
Albumin: 3.7 g/dL (ref 3.5–5.0)
Alkaline Phosphatase: 60 U/L (ref 40–150)
Anion Gap: 8 mEq/L (ref 3–11)
BILIRUBIN TOTAL: 0.56 mg/dL (ref 0.20–1.20)
BUN: 13.6 mg/dL (ref 7.0–26.0)
CO2: 25 meq/L (ref 22–29)
Calcium: 9.3 mg/dL (ref 8.4–10.4)
Chloride: 105 mEq/L (ref 98–109)
Creatinine: 0.8 mg/dL (ref 0.6–1.1)
GLUCOSE: 74 mg/dL (ref 70–140)
Potassium: 4 mEq/L (ref 3.5–5.1)
SODIUM: 138 meq/L (ref 136–145)
TOTAL PROTEIN: 7 g/dL (ref 6.4–8.3)

## 2017-02-24 LAB — CBC WITH DIFFERENTIAL/PLATELET
BASO%: 1 % (ref 0.0–2.0)
BASOS ABS: 0.1 10*3/uL (ref 0.0–0.1)
EOS%: 0 % (ref 0.0–7.0)
Eosinophils Absolute: 0 10*3/uL (ref 0.0–0.5)
HEMATOCRIT: 40.5 % (ref 34.8–46.6)
HGB: 13.8 g/dL (ref 11.6–15.9)
LYMPH#: 1.6 10*3/uL (ref 0.9–3.3)
LYMPH%: 17.3 % (ref 14.0–49.7)
MCH: 35.1 pg — AB (ref 25.1–34.0)
MCHC: 34 g/dL (ref 31.5–36.0)
MCV: 103 fL — ABNORMAL HIGH (ref 79.5–101.0)
MONO#: 0.6 10*3/uL (ref 0.1–0.9)
MONO%: 6.8 % (ref 0.0–14.0)
NEUT#: 6.8 10*3/uL — ABNORMAL HIGH (ref 1.5–6.5)
NEUT%: 74.9 % (ref 38.4–76.8)
PLATELETS: 238 10*3/uL (ref 145–400)
RBC: 3.93 10*6/uL (ref 3.70–5.45)
RDW: 18.3 % — ABNORMAL HIGH (ref 11.2–14.5)
WBC: 9 10*3/uL (ref 3.9–10.3)

## 2017-02-24 LAB — PREGNANCY, URINE: Preg Test, Ur: NEGATIVE

## 2017-02-24 MED ORDER — SODIUM CHLORIDE 0.9% FLUSH
10.0000 mL | INTRAVENOUS | Status: DC | PRN
  Administered 2017-02-24: 10 mL
  Filled 2017-02-24: qty 10

## 2017-02-24 MED ORDER — ACETAMINOPHEN 325 MG PO TABS
ORAL_TABLET | ORAL | Status: AC
  Filled 2017-02-24: qty 2

## 2017-02-24 MED ORDER — BRENTUXIMAB VEDOTIN 50 MG IV SOLR
1.3500 mg/kg | Freq: Once | INTRAVENOUS | Status: AC
  Administered 2017-02-24: 65 mg via INTRAVENOUS
  Filled 2017-02-24: qty 13

## 2017-02-24 MED ORDER — ACETAMINOPHEN 325 MG PO TABS
650.0000 mg | ORAL_TABLET | Freq: Once | ORAL | Status: AC
  Administered 2017-02-24: 650 mg via ORAL

## 2017-02-24 MED ORDER — PALONOSETRON HCL INJECTION 0.25 MG/5ML
INTRAVENOUS | Status: AC
  Filled 2017-02-24: qty 5

## 2017-02-24 MED ORDER — HEPARIN SOD (PORK) LOCK FLUSH 100 UNIT/ML IV SOLN
500.0000 [IU] | Freq: Once | INTRAVENOUS | Status: AC | PRN
  Administered 2017-02-24: 500 [IU]
  Filled 2017-02-24: qty 5

## 2017-02-24 MED ORDER — PALONOSETRON HCL INJECTION 0.25 MG/5ML
0.2500 mg | Freq: Once | INTRAVENOUS | Status: AC
  Administered 2017-02-24: 0.25 mg via INTRAVENOUS

## 2017-02-24 MED ORDER — TRAMADOL HCL 50 MG PO TABS: 100.0000 mg | ORAL_TABLET | Freq: Four times a day (QID) | ORAL | 0 refills | Status: DC | PRN

## 2017-02-24 MED ORDER — DIPHENHYDRAMINE HCL 50 MG/ML IJ SOLN
50.0000 mg | Freq: Once | INTRAMUSCULAR | Status: AC
  Administered 2017-02-24: 50 mg via INTRAVENOUS

## 2017-02-24 MED ORDER — DIPHENHYDRAMINE HCL 50 MG/ML IJ SOLN
INTRAMUSCULAR | Status: AC
  Filled 2017-02-24: qty 1

## 2017-02-24 MED ORDER — DEXAMETHASONE SODIUM PHOSPHATE 10 MG/ML IJ SOLN
INTRAMUSCULAR | Status: AC
  Filled 2017-02-24: qty 1

## 2017-02-24 MED ORDER — SODIUM CHLORIDE 0.9 % IV SOLN
Freq: Once | INTRAVENOUS | Status: AC
  Administered 2017-02-24: 10:00:00 via INTRAVENOUS

## 2017-02-24 MED ORDER — DEXAMETHASONE SODIUM PHOSPHATE 10 MG/ML IJ SOLN
10.0000 mg | Freq: Once | INTRAMUSCULAR | Status: AC
  Administered 2017-02-24: 10 mg via INTRAVENOUS

## 2017-02-24 NOTE — Progress Notes (Signed)
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      05/07/2014 Imaging    CT scan of the chest show right thoracic inlet lymphadenopathy      06/07/2014 Procedure    She underwent ultrasound-guided core biopsy of the neck lymph node      06/07/2014 Pathology Results    Accession: [id number] biopsy confirmed diagnosis of Hodgkin lymphoma.      06/15/2014 Imaging    Echocardiogram showed preserved ejection fraction      07/09/2014 - 07/12/2014 Hospital Admission    She was admitted to the hospital for severe anemia.      07/27/2014 Procedure    She had placement of port      07/31/2014 - 09/11/2014 Chemotherapy    She received dose adjusted chemotherapy due to abnormal liver function tests and severe anemia. Treatment was delayed due to noncompliance  and subsequently stopped because the patient failed to keep appointments      01/11/2015 Imaging    Repeat PET CT scan showed response to treatment      01/28/2015 - 06/18/2015 Chemotherapy    ABVD was restarted with full dose.      02/08/2015 - 02/10/2015 Hospital Admission    The patient was admitted to the hospital due to pancytopenia and profuse diarrhea. Cultures were negative. She was placed on ciprofloxacin.      02/11/2015 Adverse Reaction    Treatment was placed on hold due to recent infection.      04/11/2015 Imaging    PET CT scan showed near complete response. Incidental finding of an abnormal bone  lesion, indeterminate. She is not symptomatic. Recommendation from [facility] to observe      07/11/2015 Imaging    PET CT scan showed abnormal new bone lesions, suggestive of possible disease progression      07/23/2015 Bone Marrow Biopsy    She underwent bone biopsy      07/23/2015 Pathology Results    Accession: [id number]  biopsy was negative for cancer      11/21/2015 Surgery    She had surgery for ectopic pregnancy      01/29/2016 Imaging    Ct chest, abdomen and pelvis showed pelvic and retroperitoneal lymphadenopathy, as above, concerning for residual disease. There is also a mildly enlarged posterior mediastinal lymph node measuring 11 mm adjacent to the distal descending thoracic aorta. This may represent an additional focus of disease, but is the only finding of concern in the thorax on today's examination. Sclerosis in the right ilium at site of previously noted metabolically active lesion, grossly unchanged. No other definite osseous lesions are identified on today's examination. Spleen is normal in size and appearance.      02/14/2016 PET scan    Interval disease worsening with new foci of hypermetabolic activity in multiple retroperitoneal and pelvic lymph nodes as well as AP window and left hilar lymph nodes. (Deauville 5). There is also overall worsening of the osseous disease.      05/11/2016 Pathology Results  Diagnosis Lymph node, needle/core biopsy, Left para-aortic retroperitoneal - CLASSICAL HODGKIN LYMPHOMA. - SEE ONCOLOGY TABLE. Microscopic Comment LYMPHOMA Histologic type: Classical Hodgkin lymphoma. Grade (if applicable): N/A Flow cytometry: Not done. Immunohistochemical stains: CD15, CD20, CD3, LCA, PAX-5, CD30 with appropriate controls. Touch preps/imprints: Not performed. Comments: The sections show small needle core biopsy fragments displaying a polymorphous cellular proliferation of small lymphocytes, plasma cells, eosinophils, and large atypical  mononuclear and multilobated lymphoid cells with features of Reed-Sternberg cells and variants. This is associated with patchy areas of fibrosis. Immunohistochemical stains were performed and show that the large atypical lymphoid cells are positive for CD30, CD15 and PAX-5 and negative for LCA, CD20, CD3. The small lymphoid cells in the background show a mixture of T and B cells with predominance of T cells. The overall morphologic and histologic features are consistent with classical Hodgkin lymphoma. Further subtyping is challenging in limited small biopsy fragments but the patchy fibrosis suggests nodular sclerosis type.       05/11/2016 Procedure    She underwent CT guided biopsy of retroperitoneal lymph node      05/26/2016 Procedure    Successful placement of a right IJ approach Power Port with ultrasound and fluoroscopic guidance. The catheter is ready for use.      06/02/2016 PET scan    Mixed response to chemotherapy with some lymph nodes decreased in metabolic activity and some lymph nodes increase metabolic activity. Lymph node stations including mediastinum, periaortic retroperitoneum, and obturator node stations. Activity is remains relatively intense Deauville 4 2. LEFT infrahilar nodule / lymph node with intense metabolic activity decreased from prior. ( Deauville 4 ). 3. 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 Deauville 5      06/03/2016 - 06/05/2016 Hospital Admission    She was admitted to the hospital for cycle 1 of ICE chemotherapy      06/24/2016 - 06/26/2016 Hospital Admission    She received cycle 2 of ICE chemo      07/07/2016 PET scan    Resolution of prior hypermetabolic adenopathy and resolution of prior osseous foci of hypermetabolic activity compatible with essentially complete response to therapy (Deauville 1). 2. Generalized reduced activity in the L4 vertebral body and the type of finding which would typically reflect prior radiation  therapy. 3. Stable septated fatty right pelvic lesion, possibly a dermoid or lipoma, not hypermetabolic      12/07/7515 PET scan    Hypermetabolic lesion along the L3 vertebral body and left posterior elements, max SUV 7.8 (Deauville 5). Hypermetabolic lesion along the left inferior pubic ramus, max SUV 4.9 (Deauville 4).  IMPRESSION: Prevascular lymphadenopathy, reflecting nodal recurrence (Deauville 4). Hypermetabolic osseous metastases involving the L3 vertebral body/posterior elements and left inferior pubic ramus (Deauville 4-5). Hypermetabolism along the endometrium, new, possibly reactive/physiologic. Consider pelvic ultrasound and/or endometrial sampling as clinically warranted.      11/04/2016 -  Chemotherapy    She received Brentuximab      12/11/2016 PET scan    1. Mixed response to therapy within the skeleton. Lesions at L3 is decreased in size and metabolic activity. Residual activity remains above liver ( Deauville 4) 2. Increased activity in the LEFT sacrum with metabolic activity above liver activity ( Deauville 4 3. Decrease in size and metabolic activity of anterior mediastinal tissue consistent with resolution of thymic hyperplasia or resolution of lymphoma metabolic activity ( Deauville 2). 4. No new lymphadenopathy. Normal spleen and liver.       INTERVAL HISTORY: Please  see below for problem oriented charting. She returns for further chemotherapy She had multiple trips to the emergency department for evaluations She was recently treated with fluconazole for yeast infection She have intermittent abdominal pain of unknown etiology She denies recent constipation. She has intermittent nausea and vomiting She is compliant taking her antiretroviral treatment She also complained of severe peripheral neuropathy affecting her feet She denies new lymphadenopathy  REVIEW OF SYSTEMS:   Constitutional: Denies fevers, chills or abnormal weight loss Eyes: Denies blurriness  of vision Ears, nose, mouth, throat, and face: Denies mucositis or sore throat Respiratory: Denies cough, dyspnea or wheezes Cardiovascular: Denies palpitation, chest discomfort or lower extremity swelling Gastrointestinal:  Denies nausea, heartburn or change in bowel habits Skin: Denies abnormal skin rashes Lymphatics: Denies new lymphadenopathy or easy bruising Neurological:Denies numbness, tingling or new weaknesses Behavioral/Psych: Mood is stable, no new changes  All other systems were reviewed with the patient and are negative.  I have reviewed the past medical history, past surgical history, social history and family history with the patient and they are unchanged from previous note.  ALLERGIES:  is allergic to temazepam.  MEDICATIONS:  Current Outpatient Prescriptions  Medication Sig Dispense Refill  . atazanavir (REYATAZ) 300 MG capsule Take 1 capsule (300 mg total) by mouth daily with breakfast. Take with Norvir. 30 capsule 5  . cholecalciferol (VITAMIN D) 1000 units tablet Take 1 tablet (1,000 Units total) by mouth daily. (Patient not taking: Reported on 11/11/2016) 30 tablet 9  . dicyclomine (BENTYL) 20 MG tablet Take 1 tablet (20 mg total) by mouth 2 (two) times daily as needed for spasms. 20 tablet 0  . dronabinol (MARINOL) 5 MG capsule Take 1 capsule (5 mg total) by mouth 2 (two) times daily before a meal. 60 capsule 5  . HYDROcodone-acetaminophen (NORCO/VICODIN) 5-325 MG tablet Take 1 tablet by mouth daily as needed for moderate pain.   0  . hydrOXYzine (ATARAX/VISTARIL) 50 MG tablet Take 1 tablet (50 mg total) by mouth 3 (three) times daily as needed for itching. (Patient not taking: Reported on 02/06/2017) 30 tablet 2  . megestrol (MEGACE ES) 625 MG/5ML suspension Take 5 mLs (625 mg total) by mouth daily. (Patient not taking: Reported on 02/06/2017) 150 mL 3  . NORVIR 100 MG TABS tablet TAKE 1 TABLET BY MOUTH EVERY MORNING WITH BREAKFAST. TAKE WITH REYATAZ 30 tablet 6  .  ondansetron (ZOFRAN ODT) 4 MG disintegrating tablet Take 1 tablet (4 mg total) by mouth every 8 (eight) hours as needed for nausea or vomiting. 20 tablet 0  . Polyvinyl Alcohol-Povidone (CLEAR EYES ALL SEASONS OP) Place 1 drop into both eyes daily.    . traMADol (ULTRAM) 50 MG tablet Take 2 tablets (100 mg total) by mouth every 6 (six) hours as needed for moderate pain. 90 tablet 0  . TRUVADA 200-300 MG tablet TAKE 1 TABLET BY MOUTH EVERY DAY 30 tablet 2   No current facility-administered medications for this visit.    Facility-Administered Medications Ordered in Other Visits  Medication Dose Route Frequency Provider Last Rate Last Dose  . brentuximab vedotin (ADCETRIS) 65 mg in sodium chloride 0.9 % 100 mL chemo infusion  1.35 mg/kg (Treatment Plan Recorded) Intravenous Once Jalila Goodnough, MD      . heparin lock flush 100 unit/mL  500 Units Intracatheter Once PRN Alvy Bimler, Krystl Wickware, MD      . sodium chloride flush (NS) 0.9 % injection 10 mL  10 mL Intracatheter PRN Heath Lark, MD  PHYSICAL EXAMINATION: ECOG PERFORMANCE STATUS: 2 - Symptomatic, <50% confined to bed  Vitals:   02/24/17 0910  BP: 116/85  Pulse: 74  Resp: 18  Temp: 98.7 F (37.1 C)  SpO2: 100%   Filed Weights   02/24/17 0910  Weight: 99 lb 8 oz (45.1 kg)    GENERAL:alert, no distress and comfortable.  She looks thin and cachectic SKIN: skin color, texture, turgor are normal, no rashes or significant lesions EYES: normal, Conjunctiva are pink and non-injected, sclera clear OROPHARYNX:no exudate, no erythema and lips, buccal mucosa, and tongue normal  NECK: supple, thyroid normal size, non-tender, without nodularity LYMPH:  no palpable lymphadenopathy in the cervical, axillary or inguinal LUNGS: clear to auscultation and percussion with normal breathing effort HEART: regular rate & rhythm and no murmurs and no lower extremity edema ABDOMEN:abdomen soft, non-tender and normal bowel sounds Musculoskeletal:no cyanosis  of digits and no clubbing  NEURO: alert & oriented x 3 with fluent speech, no focal motor/sensory deficits  LABORATORY DATA:  I have reviewed the data as listed    Component Value Date/Time   NA 138 02/24/2017 0834   K 4.0 02/24/2017 0834   CL 104 02/16/2017 0730   CO2 25 02/24/2017 0834   GLUCOSE 74 02/24/2017 0834   BUN 13.6 02/24/2017 0834   CREATININE 0.8 02/24/2017 0834   CALCIUM 9.3 02/24/2017 0834   PROT 7.0 02/24/2017 0834   ALBUMIN 3.7 02/24/2017 0834   AST 18 02/24/2017 0834   ALT 34 02/24/2017 0834   ALKPHOS 60 02/24/2017 0834   BILITOT 0.56 02/24/2017 0834   GFRNONAA >60 02/16/2017 0730   GFRNONAA 83 02/20/2016 0908   GFRAA >60 02/16/2017 0730   GFRAA >89 02/20/2016 0908    No results found for: SPEP, UPEP  Lab Results  Component Value Date   WBC 9.0 02/24/2017   NEUTROABS 6.8 (H) 02/24/2017   HGB 13.8 02/24/2017   HCT 40.5 02/24/2017   MCV 103.0 (H) 02/24/2017   PLT 238 02/24/2017      Chemistry      Component Value Date/Time   NA 138 02/24/2017 0834   K 4.0 02/24/2017 0834   CL 104 02/16/2017 0730   CO2 25 02/24/2017 0834   BUN 13.6 02/24/2017 0834   CREATININE 0.8 02/24/2017 0834      Component Value Date/Time   CALCIUM 9.3 02/24/2017 0834   ALKPHOS 60 02/24/2017 0834   AST 18 02/24/2017 0834   ALT 34 02/24/2017 0834   BILITOT 0.56 02/24/2017 0834     ASSESSMENT & PLAN:  Hodgkin lymphoma, nodular sclerosis ([facility]) She tolerated last cycle of treatment poorly with multiple complications including yeast infection, diffuse joint pain and worsening peripheral neuropathy I would proceed with treatment today with dose adjustment and I plan to repeat imaging study before see her back for assessment I reminded her the importance of taking vitamin D supplement  Human immunodeficiency virus (HIV) disease ([place]) Her last HIV viral load was mildly detected She will continue anti-retroviral treatment as directed  Chronic pain of right wrist She has  chronic wrist pain and diffuse musculoskeletal pain of unknown etiology. Examination is benign. I recommend vitamin D supplement. I refilled her prescription tramadol today.  Peripheral neuropathy due to chemotherapy [facility]) she has mild peripheral neuropathy, likely related to side effects of treatment. I plan to reduce the dose of treatment as outlined above.  I explained to the patient the rationale of this strategy and reassured the patient it would not compromise  the efficacy of treatment   Abdominal pain The patient has intermittent, severe abdominal pain of unknown etiology Examination is benign but given high risk disease, we will proceed with treatment today but I plan to repeat imaging study before see her back next time for objective assessment of response to treatment   Orders Placed This Encounter  Procedures  . NM PET Image Restag (PS) Skull Base To Thigh    Standing Status:   Future    Standing Expiration Date:   02/24/2018    Order Specific Question:   If indicated for the ordered procedure, I authorize the administration of a radiopharmaceutical per Radiology protocol    Answer:   Yes    Order Specific Question:   Preferred imaging location?    Answer:   [facility]    Order Specific Question:   Radiology Contrast Protocol - do NOT remove file path    Answer:   [email or web address]    Order Specific Question:   Is the patient pregnant?    Answer:   No   All questions were answered. The patient knows to call the clinic with any problems, questions or concerns. No barriers to learning was detected. I spent 25 minutes counseling the patient face to face. The total time spent in the appointment was 40 minutes and more than 50% was on counseling and review of test results     Heath Lark, MD 02/24/2017 11:10 AM

## 2017-02-24 NOTE — Progress Notes (Signed)
Nutrition follow-up completed with patient receiving treatment for Hodgkin's lymphoma. Weight decreased and documented as 99.5 pounds on October 31.  Decreased from 102 pounds on August 21. Patient reports intermittent abdominal pain limiting oral intake. She also complains of taste alterations. She tries to drink two equate plus daily.  Nutrition diagnosis: Unintended weight loss continues.  Intervention: Patient educated on strategies for improving taste.  I provided patient with a handout. Discussed ways to increase calories and protein based on tolerance. Questions were answered.  Teach back method used.  Monitoring, evaluation, goals: Patient will work to increase calories and protein to minimize further weight loss.  Next visit: Tuesday, November 20, during infusion.  **Disclaimer: This note was dictated with voice recognition software. Similar sounding words can inadvertently be transcribed and this note may contain transcription errors which may not have been corrected upon publication of note.**

## 2017-02-24 NOTE — Assessment & Plan Note (Signed)
she has mild peripheral neuropathy, likely related to side effects of treatment. °I plan to reduce the dose of treatment as outlined above.  °I explained to the patient the rationale of this strategy and reassured the patient it would not compromise the efficacy of treatment ° °

## 2017-02-24 NOTE — Assessment & Plan Note (Signed)
Her last HIV viral load was mildly detected She will continue anti-retroviral treatment as directed

## 2017-02-24 NOTE — Patient Instructions (Signed)
Implanted Port Home Guide An implanted port is a type of central line that is placed under the skin. Central lines are used to provide IV access when treatment or nutrition needs to be given through a person's veins. Implanted ports are used for long-term IV access. An implanted port may be placed because:  You need IV medicine that would be irritating to the small veins in your hands or arms.  You need long-term IV medicines, such as antibiotics.  You need IV nutrition for a long period.  You need frequent blood draws for lab tests.  You need dialysis.  Implanted ports are usually placed in the chest area, but they can also be placed in the upper arm, the abdomen, or the leg. An implanted port has two main parts:  Reservoir. The reservoir is round and will appear as a small, raised area under your skin. The reservoir is the part where a needle is inserted to give medicines or draw blood.  Catheter. The catheter is a thin, flexible tube that extends from the reservoir. The catheter is placed into a large vein. Medicine that is inserted into the reservoir goes into the catheter and then into the vein.  How will I care for my incision site? Do not get the incision site wet. Bathe or shower as directed by your health care provider. How is my port accessed? Special steps must be taken to access the port:  Before the port is accessed, a numbing cream can be placed on the skin. This helps numb the skin over the port site.  Your health care provider uses a sterile technique to access the port. ? Your health care provider must put on a mask and sterile gloves. ? The skin over your port is cleaned carefully with an antiseptic and allowed to dry. ? The port is gently pinched between sterile gloves, and a needle is inserted into the port.  Only "non-coring" port needles should be used to access the port. Once the port is accessed, a blood return should be checked. This helps ensure that the port  is in the vein and is not clogged.  If your port needs to remain accessed for a constant infusion, a clear (transparent) bandage will be placed over the needle site. The bandage and needle will need to be changed every week, or as directed by your health care provider.  Keep the bandage covering the needle clean and dry. Do not get it wet. Follow your health care provider's instructions on how to take a shower or bath while the port is accessed.  If your port does not need to stay accessed, no bandage is needed over the port.  What is flushing? Flushing helps keep the port from getting clogged. Follow your health care provider's instructions on how and when to flush the port. Ports are usually flushed with saline solution or a medicine called heparin. The need for flushing will depend on how the port is used.  If the port is used for intermittent medicines or blood draws, the port will need to be flushed: ? After medicines have been given. ? After blood has been drawn. ? As part of routine maintenance.  If a constant infusion is running, the port may not need to be flushed.  How long will my port stay implanted? The port can stay in for as long as your health care provider thinks it is needed. When it is time for the port to come out, surgery will be   done to remove it. The procedure is similar to the one performed when the port was put in. When should I seek immediate medical care? When you have an implanted port, you should seek immediate medical care if:  You notice a bad smell coming from the incision site.  You have swelling, redness, or drainage at the incision site.  You have more swelling or pain at the port site or the surrounding area.  You have a fever that is not controlled with medicine.  This information is not intended to replace advice given to you by your health care provider. Make sure you discuss any questions you have with your health care provider. Document  Released: 04/13/2005 Document Revised: 09/19/2015 Document Reviewed: 12/19/2012 Elsevier Interactive Patient Education  2017 [employer]  

## 2017-02-24 NOTE — Assessment & Plan Note (Signed)
The patient has intermittent, severe abdominal pain of unknown etiology Examination is benign but given high risk disease, we will proceed with treatment today but I plan to repeat imaging study before see her back next time for objective assessment of response to treatment

## 2017-02-24 NOTE — Assessment & Plan Note (Signed)
She tolerated last cycle of treatment poorly with multiple complications including yeast infection, diffuse joint pain and worsening peripheral neuropathy I would proceed with treatment today with dose adjustment and I plan to repeat imaging study before see her back for assessment I reminded her the importance of taking vitamin D supplement

## 2017-02-24 NOTE — Assessment & Plan Note (Signed)
She has chronic wrist pain and diffuse musculoskeletal pain of unknown etiology. Examination is benign. I recommend vitamin D supplement. I refilled her prescription tramadol today.

## 2017-02-24 NOTE — Patient Instructions (Signed)
[facility] Discharge Instructions for Patients Receiving Chemotherapy  Today you received the following chemotherapy agents brentuximab vedotin (Adcetris)   To help prevent nausea and vomiting after your treatment, we encourage you to take your nausea medication as directed  If you develop nausea and vomiting that is not controlled by your nausea medication, call the clinic.   BELOW ARE SYMPTOMS THAT SHOULD BE REPORTED IMMEDIATELY:  *FEVER GREATER THAN 100.5 F  *CHILLS WITH OR WITHOUT FEVER  NAUSEA AND VOMITING THAT IS NOT CONTROLLED WITH YOUR NAUSEA MEDICATION  *UNUSUAL SHORTNESS OF BREATH  *UNUSUAL BRUISING OR BLEEDING  TENDERNESS IN MOUTH AND THROAT WITH OR WITHOUT PRESENCE OF ULCERS  *URINARY PROBLEMS  *BOWEL PROBLEMS  UNUSUAL RASH Items with * indicate a potential emergency and should be followed up as soon as possible.  Feel free to call the clinic you have any questions or concerns. The clinic phone number is [phone].

## 2017-03-04 ENCOUNTER — Telehealth: Payer: Self-pay

## 2017-03-04 NOTE — Telephone Encounter (Signed)
Pt started a cold this am.  Woke up coughing and runny nose. No sore throat. Bringing up a little mucus, white. No fever. She has used mucinex with improvement of cough. Also told her to use OTC antihistamine for runny nose if needed. Instructed her to call if fever, chills, color change to sputum or getting worse.  Her last brentuximab was 10/31 Next 11/20

## 2017-03-08 ENCOUNTER — Telehealth: Payer: Self-pay | Admitting: *Deleted

## 2017-03-08 ENCOUNTER — Other Ambulatory Visit: Payer: Self-pay | Admitting: Hematology and Oncology

## 2017-03-08 DIAGNOSIS — E559 Vitamin D deficiency, unspecified: Secondary | ICD-10-CM

## 2017-03-08 MED ORDER — TRAZODONE HCL 50 MG PO TABS: ORAL_TABLET | ORAL | 1 refills | Status: DC

## 2017-03-08 NOTE — Telephone Encounter (Signed)
[name] left message requesting something to help her sleep. States her legs are hurting and toes cramping at night- wake her up every hour.

## 2017-03-08 NOTE — Telephone Encounter (Signed)
Looks like oncology sent her trazodone.

## 2017-03-08 NOTE — Telephone Encounter (Signed)
Call in trazodone 50 - 100 mg qhs PO, dispense 30, no refills I will order vitamin D and magnesium level next visit

## 2017-03-08 NOTE — Telephone Encounter (Signed)
Patient called requesting an Rx for insomnia. States she has been unable to sleep for a week. Pharmacy [facility] on [address]. Please advise

## 2017-03-08 NOTE — Telephone Encounter (Signed)
Ok I left her a voice mail to contact the pharmacy.

## 2017-03-16 ENCOUNTER — Encounter: Payer: 59 | Admitting: Nutrition

## 2017-03-16 ENCOUNTER — Other Ambulatory Visit (HOSPITAL_BASED_OUTPATIENT_CLINIC_OR_DEPARTMENT_OTHER): Payer: 59

## 2017-03-16 ENCOUNTER — Encounter: Payer: Self-pay | Admitting: Nutrition

## 2017-03-16 ENCOUNTER — Ambulatory Visit: Payer: 59

## 2017-03-16 ENCOUNTER — Other Ambulatory Visit (HOSPITAL_COMMUNITY)
Admission: RE | Admit: 2017-03-16 | Discharge: 2017-03-16 | Disposition: A | Discharge status: Home / Self Care | Payer: 59 | Source: Ambulatory Visit | Attending: Hematology and Oncology | Admitting: Hematology and Oncology

## 2017-03-16 ENCOUNTER — Ambulatory Visit (HOSPITAL_BASED_OUTPATIENT_CLINIC_OR_DEPARTMENT_OTHER): Payer: 59 | Admitting: Hematology and Oncology

## 2017-03-16 ENCOUNTER — Encounter: Payer: Self-pay | Admitting: Hematology and Oncology

## 2017-03-16 DIAGNOSIS — C8118 Nodular sclerosis classical Hodgkin lymphoma, lymph nodes of multiple sites: Secondary | ICD-10-CM

## 2017-03-16 DIAGNOSIS — M25531 Pain in right wrist: Secondary | ICD-10-CM | POA: Diagnosis not present

## 2017-03-16 DIAGNOSIS — B20 Human immunodeficiency virus [HIV] disease: Secondary | ICD-10-CM | POA: Diagnosis not present

## 2017-03-16 DIAGNOSIS — G620 Drug-induced polyneuropathy: Secondary | ICD-10-CM | POA: Diagnosis not present

## 2017-03-16 DIAGNOSIS — T451X5A Adverse effect of antineoplastic and immunosuppressive drugs, initial encounter: Secondary | ICD-10-CM

## 2017-03-16 DIAGNOSIS — G8929 Other chronic pain: Secondary | ICD-10-CM | POA: Diagnosis not present

## 2017-03-16 DIAGNOSIS — E559 Vitamin D deficiency, unspecified: Secondary | ICD-10-CM

## 2017-03-16 DIAGNOSIS — Z95828 Presence of other vascular implants and grafts: Secondary | ICD-10-CM

## 2017-03-16 LAB — CBC WITH DIFFERENTIAL/PLATELET
BASO%: 0.2 % (ref 0.0–2.0)
BASOS ABS: 0 10*3/uL (ref 0.0–0.1)
EOS ABS: 0 10*3/uL (ref 0.0–0.5)
EOS%: 0.2 % (ref 0.0–7.0)
HEMATOCRIT: 36.4 % (ref 34.8–46.6)
HEMOGLOBIN: 12.3 g/dL (ref 11.6–15.9)
LYMPH#: 1.5 10*3/uL (ref 0.9–3.3)
LYMPH%: 26.2 % (ref 14.0–49.7)
MCH: 34.2 pg — AB (ref 25.1–34.0)
MCHC: 33.8 g/dL (ref 31.5–36.0)
MCV: 101.1 fL — AB (ref 79.5–101.0)
MONO#: 0.3 10*3/uL (ref 0.1–0.9)
MONO%: 5 % (ref 0.0–14.0)
NEUT#: 4 10*3/uL (ref 1.5–6.5)
NEUT%: 68.4 % (ref 38.4–76.8)
PLATELETS: 260 10*3/uL (ref 145–400)
RBC: 3.6 10*6/uL — ABNORMAL LOW (ref 3.70–5.45)
RDW: 17.4 % — AB (ref 11.2–14.5)
WBC: 5.9 10*3/uL (ref 3.9–10.3)

## 2017-03-16 LAB — COMPREHENSIVE METABOLIC PANEL
ALBUMIN: 3.6 g/dL (ref 3.5–5.0)
ALK PHOS: 67 U/L (ref 40–150)
ALT: 21 U/L (ref 0–55)
ANION GAP: 8 meq/L (ref 3–11)
AST: 17 U/L (ref 5–34)
BUN: 11.2 mg/dL (ref 7.0–26.0)
CALCIUM: 9.5 mg/dL (ref 8.4–10.4)
CHLORIDE: 110 meq/L — AB (ref 98–109)
CO2: 23 mEq/L (ref 22–29)
Creatinine: 0.7 mg/dL (ref 0.6–1.1)
Glucose: 87 mg/dl (ref 70–140)
POTASSIUM: 3.6 meq/L (ref 3.5–5.1)
Sodium: 141 mEq/L (ref 136–145)
Total Bilirubin: 0.66 mg/dL (ref 0.20–1.20)
Total Protein: 7.1 g/dL (ref 6.4–8.3)

## 2017-03-16 LAB — PREGNANCY, URINE: PREG TEST UR: NEGATIVE

## 2017-03-16 LAB — MAGNESIUM: Magnesium: 2.3 mg/dl (ref 1.5–2.5)

## 2017-03-16 MED ORDER — SODIUM CHLORIDE 0.9% FLUSH
10.0000 mL | INTRAVENOUS | Status: DC | PRN
  Administered 2017-03-16: 10 mL via INTRAVENOUS
  Filled 2017-03-16: qty 10

## 2017-03-16 MED ORDER — TRAMADOL HCL 50 MG PO TABS: 100.0000 mg | ORAL_TABLET | Freq: Four times a day (QID) | ORAL | 0 refills | Status: DC | PRN

## 2017-03-16 NOTE — Assessment & Plan Note (Signed)
She has chronic wrist pain and diffuse musculoskeletal pain of unknown etiology. Examination is benign. I recommend vitamin D supplement. I refilled her prescription tramadol today.

## 2017-03-16 NOTE — Assessment & Plan Note (Signed)
She will continue anti-retroviral treatment as directed

## 2017-03-16 NOTE — Assessment & Plan Note (Signed)
she has mild peripheral neuropathy, likely related to side effects of treatment. The patient declined other treatment for this.  Observe for now

## 2017-03-16 NOTE — Assessment & Plan Note (Signed)
She tolerated last cycle of treatment poorly with multiple complications including yeast infection, diffuse joint pain and worsening peripheral neuropathy Unfortunately, PET/CT scan is delayed until next week The patient does not want to proceed with chemotherapy today I will see her back the week after PET/CT scan is available to review test results

## 2017-03-16 NOTE — Progress Notes (Signed)
Chemotherapy treatment was cancelled. I was unable to complete nutrition follow up.

## 2017-03-16 NOTE — Progress Notes (Signed)
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      05/07/2014 Imaging    CT scan of the chest show right thoracic inlet lymphadenopathy      06/07/2014 Procedure    She underwent ultrasound-guided core biopsy of the neck lymph node      06/07/2014 Pathology Results    Accession: [id number] biopsy confirmed diagnosis of Hodgkin lymphoma.      06/15/2014 Imaging    Echocardiogram showed preserved ejection fraction      07/09/2014 - 07/12/2014 Hospital Admission    She was admitted to the hospital for severe anemia.      07/27/2014 Procedure    She had placement of port      07/31/2014 - 09/11/2014 Chemotherapy    She received dose adjusted chemotherapy due to abnormal liver function tests and severe anemia. Treatment was delayed due to noncompliance  and subsequently stopped because the patient failed to keep appointments      01/11/2015 Imaging    Repeat PET CT scan showed response to treatment      01/28/2015 - 06/18/2015 Chemotherapy    ABVD was restarted with full dose.      02/08/2015 - 02/10/2015 Hospital Admission    The patient was admitted to the hospital due to pancytopenia and profuse diarrhea. Cultures were negative. She was placed on ciprofloxacin.      02/11/2015 Adverse Reaction    Treatment was placed on hold due to recent infection.      04/11/2015 Imaging    PET CT scan showed near complete response. Incidental finding of an abnormal bone  lesion, indeterminate. She is not symptomatic. Recommendation from [facility] to observe      07/11/2015 Imaging    PET CT scan showed abnormal new bone lesions, suggestive of possible disease progression      07/23/2015 Bone Marrow Biopsy    She underwent bone biopsy      07/23/2015 Pathology Results    Accession: [id number]  biopsy was negative for cancer      11/21/2015 Surgery    She had surgery for ectopic pregnancy      01/29/2016 Imaging    Ct chest, abdomen and pelvis showed pelvic and retroperitoneal lymphadenopathy, as above, concerning for residual disease. There is also a mildly enlarged posterior mediastinal lymph node measuring 11 mm adjacent to the distal descending thoracic aorta. This may represent an additional focus of disease, but is the only finding of concern in the thorax on today's examination. Sclerosis in the right ilium at site of previously noted metabolically active lesion, grossly unchanged. No other definite osseous lesions are identified on today's examination. Spleen is normal in size and appearance.      02/14/2016 PET scan    Interval disease worsening with new foci of hypermetabolic activity in multiple retroperitoneal and pelvic lymph nodes as well as AP window and left hilar lymph nodes. (Deauville 5). There is also overall worsening of the osseous disease.      05/11/2016 Pathology Results  Diagnosis Lymph node, needle/core biopsy, Left para-aortic retroperitoneal - CLASSICAL HODGKIN LYMPHOMA. - SEE ONCOLOGY TABLE. Microscopic Comment LYMPHOMA Histologic type: Classical Hodgkin lymphoma. Grade (if applicable): N/A Flow cytometry: Not done. Immunohistochemical stains: CD15, CD20, CD3, LCA, PAX-5, CD30 with appropriate controls. Touch preps/imprints: Not performed. Comments: The sections show small needle core biopsy fragments displaying a polymorphous cellular proliferation of small lymphocytes, plasma cells, eosinophils, and large atypical  mononuclear and multilobated lymphoid cells with features of Reed-Sternberg cells and variants. This is associated with patchy areas of fibrosis. Immunohistochemical stains were performed and show that the large atypical lymphoid cells are positive for CD30, CD15 and PAX-5 and negative for LCA, CD20, CD3. The small lymphoid cells in the background show a mixture of T and B cells with predominance of T cells. The overall morphologic and histologic features are consistent with classical Hodgkin lymphoma. Further subtyping is challenging in limited small biopsy fragments but the patchy fibrosis suggests nodular sclerosis type.       05/11/2016 Procedure    She underwent CT guided biopsy of retroperitoneal lymph node      05/26/2016 Procedure    Successful placement of a right IJ approach Power Port with ultrasound and fluoroscopic guidance. The catheter is ready for use.      06/02/2016 PET scan    Mixed response to chemotherapy with some lymph nodes decreased in metabolic activity and some lymph nodes increase metabolic activity. Lymph node stations including mediastinum, periaortic retroperitoneum, and obturator node stations. Activity is remains relatively intense Deauville 4 2. LEFT infrahilar nodule / lymph node with intense metabolic activity decreased from prior. ( [place]auville 4 ). 3. 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 Deauville 5      06/03/2016 - 06/05/2016 Hospital Admission    She was admitted to the hospital for cycle 1 of ICE chemotherapy      06/24/2016 - 06/26/2016 Hospital Admission    She received cycle 2 of ICE chemo      07/07/2016 PET scan    Resolution of prior hypermetabolic adenopathy and resolution of prior osseous foci of hypermetabolic activity compatible with essentially complete response to therapy (Deauville 1). 2. Generalized reduced activity in the L4 vertebral body and the type of finding which would typically reflect prior radiation  therapy. 3. Stable septated fatty right pelvic lesion, possibly a dermoid or lipoma, not hypermetabolic      2/95/1884 PET scan    Hypermetabolic lesion along the L3 vertebral body and left posterior elements, max SUV 7.8 (Deauville 5). Hypermetabolic lesion along the left inferior pubic ramus, max SUV 4.9 (Deauville 4).  IMPRESSION: Prevascular lymphadenopathy, reflecting nodal recurrence (Deauville 4). Hypermetabolic osseous metastases involving the L3 vertebral body/posterior elements and left inferior pubic ramus (Deauville 4-5). Hypermetabolism along the endometrium, new, possibly reactive/physiologic. Consider pelvic ultrasound and/or endometrial sampling as clinically warranted.      11/04/2016 -  Chemotherapy    She received Brentuximab      12/11/2016 PET scan    1. Mixed response to therapy within the skeleton. Lesions at L3 is decreased in size and metabolic activity. Residual activity remains above liver ( Deauville 4) 2. Increased activity in the LEFT sacrum with metabolic activity above liver activity ( [place]ville 4 3. Decrease in size and metabolic activity of anterior mediastinal tissue consistent with resolution of thymic hyperplasia or resolution of lymphoma metabolic activity ( Deauville 2). 4. No new lymphadenopathy. Normal spleen and liver.       INTERVAL HISTORY: Please  see below for problem oriented charting. She returns for further follow-up with her mom She denies new lymphadenopathy She continues to have diffuse musculoskeletal pain She complains of peripheral neuropathy No recent fever, chills or cough.  Denies recent nausea or constipation.  REVIEW OF SYSTEMS:   Constitutional: Denies fevers, chills or abnormal weight loss Eyes: Denies blurriness of vision Ears, nose, mouth, throat, and face: Denies mucositis or sore throat Respiratory: Denies cough, dyspnea or wheezes Cardiovascular: Denies palpitation, chest discomfort or lower extremity  swelling Gastrointestinal:  Denies nausea, heartburn or change in bowel habits Skin: Denies abnormal skin rashes Lymphatics: Denies new lymphadenopathy or easy bruising Neurological:Denies numbness, tingling or new weaknesses Behavioral/Psych: Mood is stable, no new changes  All other systems were reviewed with the patient and are negative.  I have reviewed the past medical history, past surgical history, social history and family history with the patient and they are unchanged from previous note.  ALLERGIES:  is allergic to temazepam.  MEDICATIONS:  Current Outpatient Medications  Medication Sig Dispense Refill  . atazanavir (REYATAZ) 300 MG capsule Take 1 capsule (300 mg total) by mouth daily with breakfast. Take with Norvir. 30 capsule 5  . cholecalciferol (VITAMIN D) 1000 units tablet Take 1 tablet (1,000 Units total) by mouth daily. (Patient not taking: Reported on 11/11/2016) 30 tablet 9  . dicyclomine (BENTYL) 20 MG tablet Take 1 tablet (20 mg total) by mouth 2 (two) times daily as needed for spasms. 20 tablet 0  . dronabinol (MARINOL) 5 MG capsule Take 1 capsule (5 mg total) by mouth 2 (two) times daily before a meal. 60 capsule 5  . HYDROcodone-acetaminophen (NORCO/VICODIN) 5-325 MG tablet Take 1 tablet by mouth daily as needed for moderate pain.   0  . hydrOXYzine (ATARAX/VISTARIL) 50 MG tablet Take 1 tablet (50 mg total) by mouth 3 (three) times daily as needed for itching. (Patient not taking: Reported on 02/06/2017) 30 tablet 2  . megestrol (MEGACE ES) 625 MG/5ML suspension Take 5 mLs (625 mg total) by mouth daily. (Patient not taking: Reported on 02/06/2017) 150 mL 3  . NORVIR 100 MG TABS tablet TAKE 1 TABLET BY MOUTH EVERY MORNING WITH BREAKFAST. TAKE WITH REYATAZ 30 tablet 6  . ondansetron (ZOFRAN ODT) 4 MG disintegrating tablet Take 1 tablet (4 mg total) by mouth every 8 (eight) hours as needed for nausea or vomiting. 20 tablet 0  . Polyvinyl Alcohol-Povidone (CLEAR EYES ALL  SEASONS OP) Place 1 drop into both eyes daily.    . traMADol (ULTRAM) 50 MG tablet Take 2 tablets (100 mg total) every 6 (six) hours as needed by mouth for moderate pain. 90 tablet 0  . traZODone (DESYREL) 50 MG tablet Take 1-2 tablets as needed at bedtime for sleep. 40 tablet 1  . TRUVADA 200-300 MG tablet TAKE 1 TABLET BY MOUTH EVERY DAY 30 tablet 2   No current facility-administered medications for this visit.     PHYSICAL EXAMINATION: ECOG PERFORMANCE STATUS: 1 - Symptomatic but completely ambulatory  Vitals:   03/16/17 0938  BP: (!) 126/93  Pulse: 71  Resp: 20  Temp: 97.6 F (36.4 C)  SpO2: 100%   Filed Weights   03/16/17 0938  Weight: 104 lb 11.2 oz (47.5 kg)    GENERAL:alert, no distress and comfortable SKIN: skin color, texture, turgor are normal, no rashes or significant lesions EYES: normal, Conjunctiva are pink and non-injected, sclera clear OROPHARYNX:no exudate, no erythema and lips, buccal mucosa, and tongue normal  NECK: supple,  thyroid normal size, non-tender, without nodularity LYMPH:  no palpable lymphadenopathy in the cervical, axillary or inguinal LUNGS: clear to auscultation and percussion with normal breathing effort HEART: regular rate & rhythm and no murmurs and no lower extremity edema ABDOMEN:abdomen soft, non-tender and normal bowel sounds Musculoskeletal:no cyanosis of digits and no clubbing  NEURO: alert & oriented x 3 with fluent speech, no focal motor/sensory deficits  LABORATORY DATA:  I have reviewed the data as listed    Component Value Date/Time   NA 141 03/16/2017 0846   K 3.6 03/16/2017 0846   CL 104 02/16/2017 0730   CO2 23 03/16/2017 0846   GLUCOSE 87 03/16/2017 0846   BUN 11.2 03/16/2017 0846   CREATININE 0.7 03/16/2017 0846   CALCIUM 9.5 03/16/2017 0846   PROT 7.1 03/16/2017 0846   ALBUMIN 3.6 03/16/2017 0846   AST 17 03/16/2017 0846   ALT 21 03/16/2017 0846   ALKPHOS 67 03/16/2017 0846   BILITOT 0.66 03/16/2017 0846    GFRNONAA >60 02/16/2017 0730   GFRNONAA 83 02/20/2016 0908   GFRAA >60 02/16/2017 0730   GFRAA >89 02/20/2016 0908    No results found for: SPEP, UPEP  Lab Results  Component Value Date   WBC 5.9 03/16/2017   NEUTROABS 4.0 03/16/2017   HGB 12.3 03/16/2017   HCT 36.4 03/16/2017   MCV 101.1 (H) 03/16/2017   PLT 260 03/16/2017      Chemistry      Component Value Date/Time   NA 141 03/16/2017 0846   K 3.6 03/16/2017 0846   CL 104 02/16/2017 0730   CO2 23 03/16/2017 0846   BUN 11.2 03/16/2017 0846   CREATININE 0.7 03/16/2017 0846      Component Value Date/Time   CALCIUM 9.5 03/16/2017 0846   ALKPHOS 67 03/16/2017 0846   AST 17 03/16/2017 0846   ALT 21 03/16/2017 0846   BILITOT 0.66 03/16/2017 0846       ASSESSMENT & PLAN:  Hodgkin lymphoma, nodular sclerosis (HCC) She tolerated last cycle of treatment poorly with multiple complications including yeast infection, diffuse joint pain and worsening peripheral neuropathy Unfortunately, PET/CT scan is delayed until next week The patient does not want to proceed with chemotherapy today I will see her back the week after PET/CT scan is available to review test results  Human immunodeficiency virus (HIV) disease ([place]) She will continue anti-retroviral treatment as directed  Chronic pain of right wrist She has chronic wrist pain and diffuse musculoskeletal pain of unknown etiology. Examination is benign. I recommend vitamin D supplement. I refilled her prescription tramadol today.  Peripheral neuropathy due to chemotherapy [facility]) she has mild peripheral neuropathy, likely related to side effects of treatment. The patient declined other treatment for this.  Observe for now   No orders of the defined types were placed in this encounter.  All questions were answered. The patient knows to call the clinic with any problems, questions or concerns. No barriers to learning was detected. I spent 15 minutes counseling the patient  face to face. The total time spent in the appointment was 20 minutes and more than 50% was on counseling and review of test results     Heath Lark, MD 03/16/2017 12:49 PM

## 2017-03-17 ENCOUNTER — Telehealth: Payer: Self-pay | Admitting: *Deleted

## 2017-03-17 LAB — VITAMIN D 25 HYDROXY (VIT D DEFICIENCY, FRACTURES): Vitamin D, 25-Hydroxy: 20 ng/mL — ABNORMAL LOW (ref 30.0–100.0)

## 2017-03-17 NOTE — Telephone Encounter (Signed)
[name] states she has been taking Vitamin D 1000 mg daily. Will increase to 5 tablets per day as directed

## 2017-03-17 NOTE — Telephone Encounter (Signed)
-----   Message from Heath Lark, MD sent at 03/17/2017  7:12 AM EST ----- Regarding: low vitamin D Is she taking her vitamin D? Is very low If not, call in 50,000 units weekly PO If already taking, she needs 5000 units daily PO She did not check out

## 2017-03-17 NOTE — Telephone Encounter (Signed)
LM for [name] to call to discuss Vitamin D .

## 2017-03-21 ENCOUNTER — Ambulatory Visit (HOSPITAL_COMMUNITY)
Admission: EM | Admit: 2017-03-21 | Discharge: 2017-03-21 | Disposition: A | Discharge status: Home / Self Care | Payer: 59 | Attending: Family Medicine | Admitting: Family Medicine

## 2017-03-21 ENCOUNTER — Encounter (HOSPITAL_COMMUNITY): Payer: Self-pay | Admitting: Emergency Medicine

## 2017-03-21 DIAGNOSIS — R21 Rash and other nonspecific skin eruption: Secondary | ICD-10-CM

## 2017-03-21 MED ORDER — CLINDAMYCIN PHOSPHATE 1 % EX GEL: Freq: Every day | CUTANEOUS | 0 refills | Status: DC

## 2017-03-21 NOTE — Discharge Instructions (Signed)
We will try a topical antibiotic, use nightly, for these lesions. If worsening of rash or symptoms please see your primary care provider or return to be seen.

## 2017-03-21 NOTE — ED Triage Notes (Signed)
Pt sts rash to face that is itching x 3 days
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
## 2017-03-23 ENCOUNTER — Encounter (HOSPITAL_COMMUNITY): Payer: Self-pay | Admitting: Emergency Medicine

## 2017-03-23 ENCOUNTER — Ambulatory Visit (HOSPITAL_COMMUNITY)
Admission: EM | Admit: 2017-03-23 | Discharge: 2017-03-23 | Disposition: A | Discharge status: Home / Self Care | Payer: 59 | Attending: Family Medicine | Admitting: Family Medicine

## 2017-03-23 ENCOUNTER — Telehealth: Payer: Self-pay

## 2017-03-23 DIAGNOSIS — R21 Rash and other nonspecific skin eruption: Secondary | ICD-10-CM

## 2017-03-23 MED ORDER — DOXYCYCLINE HYCLATE 100 MG PO TABS: 100.0000 mg | ORAL_TABLET | Freq: Two times a day (BID) | ORAL | 0 refills | Status: DC

## 2017-03-23 MED ORDER — TRIAMCINOLONE ACETONIDE 0.1 % EX CREA: 1.0000 "application " | TOPICAL_CREAM | Freq: Two times a day (BID) | CUTANEOUS | 0 refills | Status: DC

## 2017-03-23 NOTE — Telephone Encounter (Signed)
S/w pt per Dr Alvy Bimler message. Instructed her also to stop OTC hydrocortisone cream she has been using. Then to discuss how the regimen is working when she sees Dr Alvy Bimler on 12/3.

## 2017-03-23 NOTE — Telephone Encounter (Signed)
Pt called back that her skin is so white and itching. Instructed her again about using moisturizers. She said she has them all in her closet and they make her break out. She stated she is ready to go back to urgent care her face bothers her so much. Asked her is she wanted to see [facility] and she said yes.    S/w Dr Alvy Bimler and she said for pt to go back to who prescribed the clindamycin. Pt was agreeable.

## 2017-03-23 NOTE — ED Provider Notes (Signed)
[place]   [id number] 03/23/17 Arrival Time: 1054   SUBJECTIVE:  [name] is a 31 y.o. female who presents to the urgent care with complaint of seen here Sunday and given an ointment for her face. PT reports it is drying her skin out. PT has tried OTC products without relief.      Past Medical History:  Diagnosis Date  . AIN III (anal intraepithelial neoplasia III)   . Anemia   . Cancer ([place])    Hodgkin lymphoma  . Chest wall pain 06/27/2015  . Condyloma acuminatum in female   . Depression   . History of chronic bronchitis   . History of esophagitis    CANDIDA  . History of shingles   . HIV (human immunodeficiency virus infection) ([place])   . Hodgkin's lymphoma ([place]) 06/12/2014  . HSV (herpes simplex virus) infection   . Hypokalemia 07/17/2014  . Periodontitis, chronic   . Screening examination for venereal disease 10/30/2013   Family History  Problem Relation Age of Onset  . Cancer Maternal Aunt        unknown ca  . Cancer Maternal Grandmother        unknown ca   Social History   Socioeconomic History  . Marital status: Single    Spouse name: Not on file  . Number of children: Not on file  . Years of education: Not on file  . Highest education level: Not on file  Social Needs  . Financial resource strain: Not on file  . Food insecurity - worry: Not on file  . Food insecurity - inability: Not on file  . Transportation needs - medical: Not on file  . Transportation needs - non-medical: Not on file  Occupational History  . Not on file  Tobacco Use  . Smoking status: Light Tobacco Smoker    Packs/day: 0.25    Years: 7.00    Pack years: 1.75    Types: Cigars, Cigarettes    Start date: 03/19/2014  . Smokeless tobacco: Never Used  . Tobacco comment: she smokes 3 Black and Mild Cigars daily  Substance and Sexual Activity  . Alcohol use: No    Alcohol/week: 0.0 oz    Comment: Occasionally  . Drug use: Yes    Types: Marijuana    Comment: 2  blunts per day  . Sexual activity: Yes    Partners: Male    Birth control/protection: None    Comment: pt. declined condoms  Other Topics Concern  . Not on file  Social History Narrative  . Not on file   Current Meds  Medication Sig  . atazanavir (REYATAZ) 300 MG capsule Take 1 capsule (300 mg total) by mouth daily with breakfast. Take with Norvir.  . cholecalciferol (VITAMIN D) 1000 units tablet Take 1 tablet (1,000 Units total) by mouth daily.  Marland Kitchen HYDROcodone-acetaminophen (NORCO/VICODIN) 5-325 MG tablet Take 1 tablet by mouth daily as needed for moderate pain.   Marland Kitchen NORVIR 100 MG TABS tablet TAKE 1 TABLET BY MOUTH EVERY MORNING WITH BREAKFAST. TAKE WITH REYATAZ  . traMADol (ULTRAM) 50 MG tablet Take 2 tablets (100 mg total) every 6 (six) hours as needed by mouth for moderate pain.  . traZODone (DESYREL) 50 MG tablet Take 1-2 tablets as needed at bedtime for sleep.  . TRUVADA 200-300 MG tablet TAKE 1 TABLET BY MOUTH EVERY DAY  . [DISCONTINUED] clindamycin (CLINDAGEL) 1 % gel Apply topically at bedtime.   Allergies  Allergen Reactions  . Temazepam  Other (See Comments)    Reaction:  Confusion/dizziness       ROS: As per HPI, remainder of ROS negative.   OBJECTIVE:   Vitals:   03/23/17 1200 03/23/17 1202 03/23/17 1203  BP:   118/83  Pulse:  80   Resp:  16   Temp:  98.4 F (36.9 C)   TempSrc:  Oral   SpO2:  100%   Weight: 108 lb (49 kg)    Height: 5\' 4"  (1.626 m)       General appearance: alert; no distress Eyes: PERRL; EOMI; conjunctiva normal HENT: normocephalic; atraumatic; external ears normal without trauma; nasal mucosa normal; oral mucosa normal Neck: supple Extremities: no cyanosis or edema; symmetrical with no gross deformities Skin: warm and dry; multiple acneiform papules on cheeks Neurologic: normal gait; grossly normal Psychological: alert and cooperative; normal mood and affect      Labs:  Results for orders placed or performed during the  hospital encounter of 03/16/17  Pregnancy, urine  Result Value Ref Range   Preg Test, Ur NEGATIVE NEGATIVE    Labs Reviewed - No data to display  No results found.     ASSESSMENT & PLAN:  1. Rash     Meds ordered this encounter  Medications  . doxycycline (VIBRA-TABS) 100 MG tablet    Sig: Take 1 tablet (100 mg total) by mouth 2 (two) times daily.    Dispense:  20 tablet    Refill:  0  . triamcinolone cream (KENALOG) 0.1 %    Sig: Apply 1 application topically 2 (two) times daily.    Dispense:  30 g    Refill:  0    Reviewed expectations re: course of current medical issues. Questions answered. Outlined signs and symptoms indicating need for more acute intervention. Patient verbalized understanding. After Visit Summary given.    Procedures:      Robyn Haber, MD 03/23/17 1218

## 2017-03-23 NOTE — Telephone Encounter (Signed)
She went to urgent care Sunday and they gave her clindamycin gel for the acne on her face. She takes it at bed time. She wakes up in morning with her face very dry and white. She does not know what to put on it in the morning.

## 2017-03-23 NOTE — ED Triage Notes (Signed)
PT reports she was seen here Sunday and given an ointment for her face. PT reports it is drying her skin out. PT has tried OTC products without relief.

## 2017-03-23 NOTE — Discharge Instructions (Signed)
Return if you are not seeing improvement over the next several days.

## 2017-03-23 NOTE — Telephone Encounter (Signed)
Clindamycin gel can be used twice a day The dryness is expected. She can use moisturizer cream as needed in between

## 2017-03-24 ENCOUNTER — Telehealth: Payer: Self-pay | Admitting: Hematology and Oncology

## 2017-03-24 NOTE — Telephone Encounter (Signed)
Spoke with patient re 12/3 appointment

## 2017-03-25 ENCOUNTER — Ambulatory Visit (HOSPITAL_COMMUNITY)
Admission: RE | Admit: 2017-03-25 | Discharge: 2017-03-25 | Disposition: A | Discharge status: Home / Self Care | Payer: 59 | Source: Ambulatory Visit | Attending: Hematology and Oncology | Admitting: Hematology and Oncology

## 2017-03-25 DIAGNOSIS — C8118 Nodular sclerosis classical Hodgkin lymphoma, lymph nodes of multiple sites: Secondary | ICD-10-CM | POA: Insufficient documentation

## 2017-03-25 DIAGNOSIS — J439 Emphysema, unspecified: Secondary | ICD-10-CM | POA: Insufficient documentation

## 2017-03-25 LAB — GLUCOSE, CAPILLARY: Glucose-Capillary: 87 mg/dL (ref 65–99)

## 2017-03-25 MED ORDER — FLUDEOXYGLUCOSE F - 18 (FDG) INJECTION: 6.8000 | Freq: Once | INTRAVENOUS | Status: DC | PRN

## 2017-03-27 ENCOUNTER — Emergency Department (HOSPITAL_COMMUNITY)
Admission: EM | Admit: 2017-03-27 | Discharge: 2017-03-27 | Disposition: A | Discharge status: Home / Self Care | Payer: 59 | Attending: Emergency Medicine | Admitting: Emergency Medicine

## 2017-03-27 ENCOUNTER — Encounter (HOSPITAL_COMMUNITY): Payer: Self-pay | Admitting: *Deleted

## 2017-03-27 ENCOUNTER — Other Ambulatory Visit: Payer: Self-pay

## 2017-03-27 DIAGNOSIS — Z79899 Other long term (current) drug therapy: Secondary | ICD-10-CM | POA: Diagnosis not present

## 2017-03-27 DIAGNOSIS — E876 Hypokalemia: Secondary | ICD-10-CM

## 2017-03-27 DIAGNOSIS — R45851 Suicidal ideations: Secondary | ICD-10-CM | POA: Insufficient documentation

## 2017-03-27 DIAGNOSIS — F1014 Alcohol abuse with alcohol-induced mood disorder: Secondary | ICD-10-CM | POA: Diagnosis not present

## 2017-03-27 DIAGNOSIS — F10220 Alcohol dependence with intoxication, uncomplicated: Secondary | ICD-10-CM | POA: Diagnosis not present

## 2017-03-27 DIAGNOSIS — F17210 Nicotine dependence, cigarettes, uncomplicated: Secondary | ICD-10-CM | POA: Diagnosis not present

## 2017-03-27 DIAGNOSIS — F329 Major depressive disorder, single episode, unspecified: Secondary | ICD-10-CM | POA: Insufficient documentation

## 2017-03-27 DIAGNOSIS — F10920 Alcohol use, unspecified with intoxication, uncomplicated: Secondary | ICD-10-CM

## 2017-03-27 DIAGNOSIS — Y906 Blood alcohol level of 120-199 mg/100 ml: Secondary | ICD-10-CM | POA: Insufficient documentation

## 2017-03-27 DIAGNOSIS — X838XXA Intentional self-harm by other specified means, initial encounter: Secondary | ICD-10-CM

## 2017-03-27 DIAGNOSIS — Z8571 Personal history of Hodgkin lymphoma: Secondary | ICD-10-CM | POA: Diagnosis not present

## 2017-03-27 DIAGNOSIS — B20 Human immunodeficiency virus [HIV] disease: Secondary | ICD-10-CM | POA: Diagnosis not present

## 2017-03-27 LAB — COMPREHENSIVE METABOLIC PANEL
ALK PHOS: 76 U/L (ref 38–126)
ALT: 28 U/L (ref 14–54)
AST: 32 U/L (ref 15–41)
Albumin: 4.3 g/dL (ref 3.5–5.0)
Anion gap: 12 (ref 5–15)
BILIRUBIN TOTAL: 1.4 mg/dL — AB (ref 0.3–1.2)
BUN: 10 mg/dL (ref 6–20)
CALCIUM: 9.1 mg/dL (ref 8.9–10.3)
CO2: 20 mmol/L — ABNORMAL LOW (ref 22–32)
CREATININE: 0.99 mg/dL (ref 0.44–1.00)
Chloride: 107 mmol/L (ref 101–111)
Glucose, Bld: 105 mg/dL — ABNORMAL HIGH (ref 65–99)
Potassium: 3 mmol/L — ABNORMAL LOW (ref 3.5–5.1)
Sodium: 139 mmol/L (ref 135–145)
Total Protein: 7.9 g/dL (ref 6.5–8.1)

## 2017-03-27 LAB — CBC
HCT: 38.4 % (ref 36.0–46.0)
HEMOGLOBIN: 13.4 g/dL (ref 12.0–15.0)
MCH: 35.2 pg — AB (ref 26.0–34.0)
MCHC: 34.9 g/dL (ref 30.0–36.0)
MCV: 100.8 fL — AB (ref 78.0–100.0)
PLATELETS: 308 10*3/uL (ref 150–400)
RBC: 3.81 MIL/uL — ABNORMAL LOW (ref 3.87–5.11)
RDW: 17.2 % — ABNORMAL HIGH (ref 11.5–15.5)
WBC: 11.3 10*3/uL — ABNORMAL HIGH (ref 4.0–10.5)

## 2017-03-27 LAB — RAPID URINE DRUG SCREEN, HOSP PERFORMED
Amphetamines: NOT DETECTED
Barbiturates: NOT DETECTED
Benzodiazepines: NOT DETECTED
Cocaine: NOT DETECTED
OPIATES: NOT DETECTED
TETRAHYDROCANNABINOL: POSITIVE — AB

## 2017-03-27 LAB — ETHANOL: ALCOHOL ETHYL (B): 152 mg/dL — AB (ref <10)

## 2017-03-27 LAB — I-STAT BETA HCG BLOOD, ED (NOT ORDERABLE)

## 2017-03-27 LAB — SALICYLATE LEVEL

## 2017-03-27 LAB — ACETAMINOPHEN LEVEL: Acetaminophen (Tylenol), Serum: <10 ug/mL — ABNORMAL LOW (ref 10–30)

## 2017-03-27 MED ORDER — POTASSIUM CHLORIDE CRYS ER 20 MEQ PO TBCR
40.0000 meq | EXTENDED_RELEASE_TABLET | Freq: Once | ORAL | Status: AC
  Administered 2017-03-27: 40 meq via ORAL
  Filled 2017-03-27: qty 2

## 2017-03-27 MED ORDER — ACETAMINOPHEN 325 MG PO TABS
650.0000 mg | ORAL_TABLET | ORAL | Status: DC | PRN
  Administered 2017-03-27: 650 mg via ORAL
  Filled 2017-03-27: qty 2

## 2017-03-27 MED ORDER — NICOTINE 14 MG/24HR TD PT24: 14.0000 mg | MEDICATED_PATCH | Freq: Every day | TRANSDERMAL | Status: DC

## 2017-03-27 MED ORDER — POTASSIUM CHLORIDE CRYS ER 20 MEQ PO TBCR
40.0000 meq | EXTENDED_RELEASE_TABLET | Freq: Three times a day (TID) | ORAL | Status: DC
  Administered 2017-03-27: 40 meq via ORAL
  Filled 2017-03-27: qty 2

## 2017-03-27 MED ORDER — ONDANSETRON HCL 4 MG PO TABS: 4.0000 mg | ORAL_TABLET | Freq: Three times a day (TID) | ORAL | Status: DC | PRN

## 2017-03-27 NOTE — ED Notes (Addendum)
Written dc instructions reviewed w/ pt.  Pt encouraged to eat potassium rich foods  and discuss it with her Dr. On Monday.  Pt reports that she was given potassium pills to take months ago, but she does not take them.  Pt encouraged to take her medications as directed, and keep her up coming appointments. Pt denies suicidal thoughts on dc.  Pt encouraged to seek treatment for return of suicidal thoughts.  Pt verbalized understanding.  Pt ambulatory w/o difficulty to dc area with security, belonging returned after leaving the area.

## 2017-03-27 NOTE — ED Notes (Signed)
On the phone to call for a ride  

## 2017-03-27 NOTE — ED Notes (Signed)
Up to the bathroom 

## 2017-03-27 NOTE — ED Notes (Signed)
Dr Adele Schilder and Theodoro Clock DNP into see.  Pt denies si/ha/avh at this time. Pt reports that she has not received treatment  For depression, and lives alone.  Pt reports that she was drinking last night because it was her birthday, and that there are no bulllets for the gun.

## 2017-03-27 NOTE — ED Notes (Signed)
Bed: [facility] Expected date:  Expected time:  Means of arrival:  Comments: Room 29

## 2017-03-27 NOTE — ED Triage Notes (Signed)
Pt brought in by [employer].  Per [employer], pt had a gun and wanted to kill self.  ETOH noted.  Pt has hx of HIV and cancer.

## 2017-03-27 NOTE — ED Notes (Signed)
TTS assessment in progress. 
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
## 2017-03-27 NOTE — ED Notes (Signed)
Dr Vanita Panda updated with pt's dc hiome--instructed to encourage pt to eat potassium rich foods
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
## 2017-03-29 ENCOUNTER — Telehealth: Payer: Self-pay | Admitting: Hematology and Oncology

## 2017-03-29 ENCOUNTER — Encounter: Payer: Self-pay | Admitting: Hematology and Oncology

## 2017-03-29 ENCOUNTER — Ambulatory Visit (HOSPITAL_BASED_OUTPATIENT_CLINIC_OR_DEPARTMENT_OTHER): Payer: 59 | Admitting: Hematology and Oncology

## 2017-03-29 DIAGNOSIS — Z5111 Encounter for antineoplastic chemotherapy: Secondary | ICD-10-CM

## 2017-03-29 DIAGNOSIS — C8118 Nodular sclerosis classical Hodgkin lymphoma, lymph nodes of multiple sites: Secondary | ICD-10-CM

## 2017-03-29 DIAGNOSIS — Z7189 Other specified counseling: Secondary | ICD-10-CM | POA: Diagnosis not present

## 2017-03-29 DIAGNOSIS — G8929 Other chronic pain: Secondary | ICD-10-CM | POA: Diagnosis not present
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
## 2017-03-29 NOTE — Telephone Encounter (Signed)
Scheduled appt per 12/3 los - Gave patient aVS and calender per los.

## 2017-03-29 NOTE — Assessment & Plan Note (Addendum)
Unfortunately, the patient have disease progression on recent PET/CT scan She is adamant she does not want aggressive chemotherapy or bone marrow transplant The decision was made based on NCCN guidelines Role of treatment is palliative. 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 Fanale, Philippe Diamond Ridge, Reino Bellis, Mosetta Anis, Vincent Ribrag, Kelli Churn, Theodoros P. Vassilakopoulos, Laretta Bolster, Foster Simpson, Margaret A. Shipp, Wyline Beady, Edmonia James Ricart, Dahlia Bailiff and Tana Felts  Blood 2016 [phone];   Background: Classical Hodgkin lymphoma (cHL) is characterized by chromosome 9p24.1 alterations (including amplification), leading to overexpression of the PD-L1 and PD-L2 immune checkpoint ligands. This genetically determined dependence on the PD-1 pathway makes cHL an attractive target for PD-1 blockade with the anti-PD-1 monoclonal antibody, pembrolizumab. In the phase 1b KEYNOTE-013 study, pembrolizumab demonstrated high antitumor activity (objective response rate [ORR] of 65%) in heavily pretreated patients with cHL. KEYNOTE-087 is a phase 2 study designed to further evaluate the efficacy and safety of pembrolizumab in different subgroups of patients with relapsed/refractory (R/R) cHL.  Methods: KEYNOTE-087 ([email or web address], [id number]) is a multicenter, single-arm, multicohort phase 2 study of pembrolizumab in 3 cohorts of patients with R/R cHL: R/R cHL after autologous stem cell transplantation (ASCT) and subsequent brentuximab vedotin (BV) therapy (cohort 1); ineligibility for ASCT due to chemoresistance (no response to salvage chemotherapy) and BV therapy failure (cohort 2); and R/R cHL after ASCT but not treated with BV after ASCT (cohort 3). Patients received  pembrolizumab at a fixed dose of 200 mg intravenously every 3 weeks. Response was assessed every 12 weeks according to the 2007 Revised Response Criteria for Malignant Lymphomas. The primary end point was ORR per blinded independent central review (BICR); secondary end points included ORR per investigator review (IR), complete remission rate (CRR), progression-free survival, and overall survival. All patients who received at least 1 dose of pembrolizumab were included in the analyses. Informed consent was obtained for all patients. Biomarkers included PD-L1/PD-L2 expression in formalin-fixed, paraffin-embedded tissue; flow cytometry-based evaluation of absolute and relative numbers of circulating NK cells and T-cell subsets (naive and memory T cells, activated T cells, and regulatory T cells); and gene expression using the [employer] and [employer]. The data cutoff date for these analyses was October 22, 2014.  Results: Among 210 treated patients in cohorts 1 (n = 3), 2 (n = 81), and 3 (n = 60), all patients had refractory disease or relapsed HL. Of these, 98.6%, 96.3%, and 60.0% had received ?3 prior lines of therapy, and by design 100% of patients in cohorts 1 and 2 had progressive disease after BV. 41.7% of patients received BV before ASCT in cohort 3. Per IR, ORR (95% CI) was 66.7% (54.3%-77.6%) in cohort 1 (46/69 patients), 65.4% (54.0%-75.7%) in cohort 2 (53/81 patients), and 68.3% (55.0%-79.7%) in cohort 3 (41/60 patients). The CRR was 29.0% in cohort 1, 24.7% in cohort 2, and 21.7% in cohort 3. Per BICR, the ORRs (95% CI) for each cohort were 72.5% (60.4%-82.5%), 65.4% (54.0%-75.7%), and 66.7% (53.3%-78.3%), respectively, and the CRRs were 21.7%, 22.2%, and 21.7%, respectively. A pooled analysis with hierarchical mutually exclusive categories of refractory disease (RD, n = 170) or relapse after ?3 prior lines of therapy (Re ?3, n = 40) was conducted across cohorts. Per BICR, ORR was 70.0%  (62.5%-76.8%) in RD and 60.0% (43.3%-75.1%) in Re ?3. Among  patients with postbaseline assessment across all cohorts, 93.7% (192/205) experienced a decrease from baseline in tumor size (Figure). With a median of 9 treatment cycles, the most common treatment-related AEs (TRAEs) were pyrexia (11.0%), hypothyroidism (10.5%), diarrhea (6.7%), fatigue (6.7%), headache (6.2%), rash (6.2%), and nausea (5.7%). The most common grade 3/4 TRAEs were neutropenia (1.4%), thrombocytopenia (1.0%), and diarrhea (1.0%). Two patients died; neither death was considered to be treatment-related. At the time of analysis, 115 patients (80% of responders) had an ongoing response. Two hundred patients had evaluable pretreatment tumor tissue (archival or obtained for study) for biomarker analyses.  Conclusions: PD-1 blockade with pembrolizumab had substantial clinical activity in subsets of heavily pretreated patients with cHL. Of note, pembrolizumab induced a high ORR in chemoresistant cHL. Additional results, including duration of response per BICR and biomarker analysis, will be presented at the meeting  Some of the short term side-effects included, though not limited to, risk of fatigue, weight loss, tumor lysis syndrome, risk of allergic reactions, pancytopenia, pneumonitis life-threatening infections, need for transfusions of blood products, admission to hospital for various reasons, and risks of death.   The patient is aware that the response rates discussed earlier is not guaranteed.    After a long discussion, patient made an informed decision to proceed with the prescribed plan of care.  She will also continue Lupron for hormonal suppressive therapy

## 2017-03-29 NOTE — Assessment & Plan Note (Signed)
The patient is aware she has incurable disease and treatment is strictly palliative. We discussed importance of Advanced Directives and Living will. The patient had recent depression but currently she denies suicidal ideation

## 2017-03-29 NOTE — Progress Notes (Signed)
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     A cycle is every 3 weeks:     Brentuximab vedotin   **Always confirm dose/schedule in your pharmacy ordering system**    REASON: Disease Progression PRIOR TREATMENT: [id number]: Brentuximab 1.8 mg/kg q3 Weeks (Treat Until Maximum Benefit or Unacceptable Toxicity) TREATMENT RESPONSE: Progressive Disease (PD)  START ON PATHWAY REGIMEN - Lymphoma and CLL     A cycle is 21 days:     Pembrolizumab   **Always confirm dose/schedule in your pharmacy ordering system**    Patient Characteristics: Classical Hodgkin Lymphoma, Fourth Line and Beyond Disease Type: Not Applicable Disease Type: Not Applicable Disease Type: Classical Hodgkin Lymphoma Line of therapy: Fourth Line and Beyond [place] Stage: IVA Intent of Therapy: Non-Curative / Palliative Intent, Discussed with Patient

## 2017-03-30 ENCOUNTER — Other Ambulatory Visit: Payer: Self-pay | Admitting: Infectious Diseases

## 2017-03-30 DIAGNOSIS — B20 Human immunodeficiency virus [HIV] disease: Secondary | ICD-10-CM

## 2017-03-31 ENCOUNTER — Ambulatory Visit (INDEPENDENT_AMBULATORY_CARE_PROVIDER_SITE_OTHER): Payer: 59 | Admitting: Pharmacist

## 2017-03-31 ENCOUNTER — Telehealth: Payer: Self-pay

## 2017-03-31 DIAGNOSIS — B20 Human immunodeficiency virus [HIV] disease: Secondary | ICD-10-CM | POA: Diagnosis not present

## 2017-03-31 MED ORDER — RITONAVIR 100 MG PO TABS: ORAL_TABLET | ORAL | 5 refills | Status: DC

## 2017-03-31 MED ORDER — TRAMADOL HCL 50 MG PO TABS: 100.0000 mg | ORAL_TABLET | Freq: Four times a day (QID) | ORAL | 0 refills | Status: DC | PRN

## 2017-03-31 MED ORDER — EMTRICITABINE-TENOFOVIR DF 200-300 MG PO TABS: 1.0000 | ORAL_TABLET | Freq: Every day | ORAL | 5 refills | Status: DC

## 2017-03-31 MED ORDER — ATAZANAVIR SULFATE 300 MG PO CAPS: 300.0000 mg | ORAL_CAPSULE | Freq: Every day | ORAL | 5 refills | Status: DC

## 2017-03-31 NOTE — Telephone Encounter (Signed)
I do not prescribe hydrocodone for her She was only prescribed tramadol

## 2017-03-31 NOTE — Progress Notes (Signed)
HPI: [name] is a 31 y.o. female who presents to the [place] clinic today as a walk-in. She is requesting more refills.   Allergies: Allergies  Allergen Reactions  . Temazepam Other (See Comments)    Reaction:  Confusion/dizziness     Past Medical History: Past Medical History:  Diagnosis Date  . AIN III (anal intraepithelial neoplasia III)   . Anemia   . Cancer ([place])    Hodgkin lymphoma  . Chest wall pain 06/27/2015  . Condyloma acuminatum in female   . Depression   . History of chronic bronchitis   . History of esophagitis    CANDIDA  . History of shingles   . HIV (human immunodeficiency virus infection) ([place])   . Hodgkin's lymphoma ([place]) 06/12/2014  . HSV (herpes simplex virus) infection   . Hypokalemia 07/17/2014  . Periodontitis, chronic   . Screening examination for venereal disease 10/30/2013    Social History: Social History   Socioeconomic History  . Marital status: Single    Spouse name: Not on file  . Number of children: Not on file  . Years of education: Not on file  . Highest education level: Not on file  Social Needs  . Financial resource strain: Not on file  . Food insecurity - worry: Not on file  . Food insecurity - inability: Not on file  . Transportation needs - medical: Not on file  . Transportation needs - non-medical: Not on file  Occupational History  . Not on file  Tobacco Use  . Smoking status: Light Tobacco Smoker    Packs/day: 0.25    Years: 7.00    Pack years: 1.75    Types: Cigars, Cigarettes    Start date: 03/19/2014  . Smokeless tobacco: Never Used  . Tobacco comment: she smokes 3 Black and Mild Cigars daily  Substance and Sexual Activity  . Alcohol use: Yes    Alcohol/week: 0.0 oz    Comment: Occasionally  . Drug use: Yes    Types: Marijuana    Comment: 2 blunts per day  . Sexual activity: Yes    Partners: Male    Birth control/protection: None    Comment: pt. declined condoms  Other Topics Concern  . Not on file   Social History Narrative  . Not on file    Current Regimen: Reyataz/Novir + Truvada  Labs: HIV 1 RNA Quant (copies/mL)  Date Value  12/15/2016 <20 DETECTED (A)  07/02/2016 <20 NOT DETECTED  02/20/2016 <20   CD4 T Cell Abs (/uL)  Date Value  12/15/2016 530  07/02/2016 142 (L)  02/20/2016 880   Hep B S Ab (no units)  Date Value  04/29/2010 NEG   Hepatitis B Surface Ag (no units)  Date Value  05/05/2014 NEGATIVE   HCV Ab (no units)  Date Value  05/05/2014 NEGATIVE    CrCl: Estimated Creatinine Clearance: 63.1 mL/min (by C-G formula based on SCr of 0.99 mg/dL).  Lipids:    Component Value Date/Time   CHOL 148 02/20/2016 0908   TRIG 108 02/20/2016 0908   HDL 34 (L) 02/20/2016 0908   CHOLHDL 4.4 02/20/2016 0908   VLDL 22 02/20/2016 0908   LDLCALC 92 02/20/2016 0908    Assessment: [name] is here today as a walk-in. She was seeing Allison Hazel, RN, and [name] asked me to see her as well to see how she was doing.  She is a long standing HIV patient of [name].  She has also been diagnosed  with Hodgkin Lymphoma and was just told a few days ago by oncology that there is no cure or hope of cure for her cancer and her treatment is palliative only. She is on Keytruda.  She seems in good spirits today in lieu of her situation.  She is planning on freezing her eggs to get pregnant one day.  She needs refills and looks like she is a compliant patient, so I will refill them today. She had a lab appt later this month, but we will get labs today since she is here.  She sees [name] in January. She states she is having no problems with her medications and no side effects.  Also - no drug interactions found when searched with Keytruda.  She takes her medications every day with food.  Plans: - Continue Reytaz + Norvir + Truvada  - HIV RNA, CD4 today - F/u with [name] 05/04/17 at [place]. [name], PharmD, [facility], [facility] 03/31/2017, 2:37 PM

## 2017-03-31 NOTE — Telephone Encounter (Signed)
Called pt back and she has no pain medications in the house. Is it OK to refill tramadol? This is 11 day rx and it has been 15 days.

## 2017-03-31 NOTE — Telephone Encounter (Signed)
S/w Tammi RN and refilled tramadol

## 2017-03-31 NOTE — Addendum Note (Signed)
Addended by: Janace Hoard on: 03/31/2017 04:44 PM   Modules accepted: Orders

## 2017-03-31 NOTE — Telephone Encounter (Signed)
Pt called for hydrocodone refill. She has a bad toothache. She cannot get in to dentist for another month. She does have an appt. Her calendar is at home she cannot give an exact date.   She is out of hydrocodone.

## 2017-04-01 LAB — T-HELPER CELL (CD4) - (RCID CLINIC ONLY)
CD4 T CELL HELPER: 43 % (ref 33–55)
CD4 T Cell Abs: 650 /uL (ref 400–2700)

## 2017-04-02 LAB — HIV-1 RNA QUANT-NO REFLEX-BLD
HIV 1 RNA Quant: 24 copies/mL — ABNORMAL HIGH
HIV-1 RNA QUANT, LOG: 1.38 {Log_copies}/mL — AB

## 2017-04-06 ENCOUNTER — Ambulatory Visit: Payer: 59

## 2017-04-06 ENCOUNTER — Ambulatory Visit (HOSPITAL_BASED_OUTPATIENT_CLINIC_OR_DEPARTMENT_OTHER): Payer: 59

## 2017-04-06 ENCOUNTER — Other Ambulatory Visit: Payer: Self-pay | Admitting: Hematology and Oncology

## 2017-04-06 ENCOUNTER — Other Ambulatory Visit (HOSPITAL_BASED_OUTPATIENT_CLINIC_OR_DEPARTMENT_OTHER): Payer: 59

## 2017-04-06 VITALS — BP 118/82 | HR 80 | Temp 99.5°F | Resp 18

## 2017-04-06 DIAGNOSIS — Z79899 Other long term (current) drug therapy: Secondary | ICD-10-CM

## 2017-04-06 DIAGNOSIS — C8118 Nodular sclerosis classical Hodgkin lymphoma, lymph nodes of multiple sites: Secondary | ICD-10-CM

## 2017-04-06 DIAGNOSIS — Z95828 Presence of other vascular implants and grafts: Secondary | ICD-10-CM

## 2017-04-06 DIAGNOSIS — Z5112 Encounter for antineoplastic immunotherapy: Secondary | ICD-10-CM

## 2017-04-06 DIAGNOSIS — Z5111 Encounter for antineoplastic chemotherapy: Secondary | ICD-10-CM | POA: Diagnosis not present

## 2017-04-06 LAB — CBC WITH DIFFERENTIAL/PLATELET
BASO%: 0.8 % (ref 0.0–2.0)
Basophils Absolute: 0.1 10*3/uL (ref 0.0–0.1)
EOS%: 0.9 % (ref 0.0–7.0)
Eosinophils Absolute: 0.1 10*3/uL (ref 0.0–0.5)
HEMATOCRIT: 35.9 % (ref 34.8–46.6)
HEMOGLOBIN: 12.2 g/dL (ref 11.6–15.9)
LYMPH#: 1.4 10*3/uL (ref 0.9–3.3)
LYMPH%: 15.8 % (ref 14.0–49.7)
MCH: 35.6 pg — ABNORMAL HIGH (ref 25.1–34.0)
MCHC: 34 g/dL (ref 31.5–36.0)
MCV: 104.9 fL — ABNORMAL HIGH (ref 79.5–101.0)
MONO#: 0.9 10*3/uL (ref 0.1–0.9)
MONO%: 9.9 % (ref 0.0–14.0)
NEUT%: 72.6 % (ref 38.4–76.8)
NEUTROS ABS: 6.3 10*3/uL (ref 1.5–6.5)
Platelets: 256 10*3/uL (ref 145–400)
RBC: 3.43 10*6/uL — ABNORMAL LOW (ref 3.70–5.45)
RDW: 17.8 % — AB (ref 11.2–14.5)
WBC: 8.7 10*3/uL (ref 3.9–10.3)

## 2017-04-06 LAB — COMPREHENSIVE METABOLIC PANEL
ALBUMIN: 3.8 g/dL (ref 3.5–5.0)
ALT: 17 U/L (ref 0–55)
AST: 13 U/L (ref 5–34)
Alkaline Phosphatase: 62 U/L (ref 40–150)
Anion Gap: 9 mEq/L (ref 3–11)
BUN: 8.2 mg/dL (ref 7.0–26.0)
CALCIUM: 9.1 mg/dL (ref 8.4–10.4)
CHLORIDE: 109 meq/L (ref 98–109)
CO2: 21 mEq/L — ABNORMAL LOW (ref 22–29)
CREATININE: 0.8 mg/dL (ref 0.6–1.1)
EGFR: >60 mL/min/{1.73_m2} (ref >60)
Glucose: 87 mg/dl (ref 70–140)
Potassium: 3.6 mEq/L (ref 3.5–5.1)
Sodium: 138 mEq/L (ref 136–145)
TOTAL PROTEIN: 6.8 g/dL (ref 6.4–8.3)
Total Bilirubin: 0.57 mg/dL (ref 0.20–1.20)

## 2017-04-06 LAB — TSH: TSH: 1.275 m[IU]/L (ref 0.308–3.960)

## 2017-04-06 MED ORDER — SODIUM CHLORIDE 0.9% FLUSH
10.0000 mL | INTRAVENOUS | Status: DC | PRN
Start: 2017-04-06 — End: 2017-04-06
  Administered 2017-04-06: 10 mL via INTRAVENOUS
  Filled 2017-04-06: qty 10

## 2017-04-06 MED ORDER — LEUPROLIDE ACETATE 7.5 MG IM KIT
7.5000 mg | PACK | Freq: Once | INTRAMUSCULAR | Status: AC
Start: 2017-04-06 — End: 2017-04-06
  Administered 2017-04-06: 7.5 mg via INTRAMUSCULAR
  Filled 2017-04-06: qty 7.5

## 2017-04-06 MED ORDER — HEPARIN SOD (PORK) LOCK FLUSH 100 UNIT/ML IV SOLN
500.0000 [IU] | Freq: Once | INTRAVENOUS | Status: AC | PRN
  Administered 2017-04-06: 500 [IU]
  Filled 2017-04-06: qty 5

## 2017-04-06 MED ORDER — SODIUM CHLORIDE 0.9 % IV SOLN
Freq: Once | INTRAVENOUS | Status: AC
  Administered 2017-04-06: 11:00:00 via INTRAVENOUS

## 2017-04-06 MED ORDER — SODIUM CHLORIDE 0.9 % IV SOLN
200.0000 mg | Freq: Once | INTRAVENOUS | Status: AC
  Administered 2017-04-06: 200 mg via INTRAVENOUS
  Filled 2017-04-06: qty 8

## 2017-04-06 MED ORDER — SODIUM CHLORIDE 0.9% FLUSH
10.0000 mL | INTRAVENOUS | Status: DC | PRN
  Administered 2017-04-06: 10 mL
  Filled 2017-04-06: qty 10

## 2017-04-06 NOTE — Patient Instructions (Signed)
Implanted Port Home Guide An implanted port is a type of central line that is placed under the skin. Central lines are used to provide IV access when treatment or nutrition needs to be given through a person's veins. Implanted ports are used for long-term IV access. An implanted port may be placed because:  You need IV medicine that would be irritating to the small veins in your hands or arms.  You need long-term IV medicines, such as antibiotics.  You need IV nutrition for a long period.  You need frequent blood draws for lab tests.  You need dialysis.  Implanted ports are usually placed in the chest area, but they can also be placed in the upper arm, the abdomen, or the leg. An implanted port has two main parts:  Reservoir. The reservoir is round and will appear as a small, raised area under your skin. The reservoir is the part where a needle is inserted to give medicines or draw blood.  Catheter. The catheter is a thin, flexible tube that extends from the reservoir. The catheter is placed into a large vein. Medicine that is inserted into the reservoir goes into the catheter and then into the vein.  How will I care for my incision site? Do not get the incision site wet. Bathe or shower as directed by your health care provider. How is my port accessed? Special steps must be taken to access the port:  Before the port is accessed, a numbing cream can be placed on the skin. This helps numb the skin over the port site.  Your health care provider uses a sterile technique to access the port. ? Your health care provider must put on a mask and sterile gloves. ? The skin over your port is cleaned carefully with an antiseptic and allowed to dry. ? The port is gently pinched between sterile gloves, and a needle is inserted into the port.  Only "non-coring" port needles should be used to access the port. Once the port is accessed, a blood return should be checked. This helps ensure that the port  is in the vein and is not clogged.  If your port needs to remain accessed for a constant infusion, a clear (transparent) bandage will be placed over the needle site. The bandage and needle will need to be changed every week, or as directed by your health care provider.  Keep the bandage covering the needle clean and dry. Do not get it wet. Follow your health care provider's instructions on how to take a shower or bath while the port is accessed.  If your port does not need to stay accessed, no bandage is needed over the port.  What is flushing? Flushing helps keep the port from getting clogged. Follow your health care provider's instructions on how and when to flush the port. Ports are usually flushed with saline solution or a medicine called heparin. The need for flushing will depend on how the port is used.  If the port is used for intermittent medicines or blood draws, the port will need to be flushed: ? After medicines have been given. ? After blood has been drawn. ? As part of routine maintenance.  If a constant infusion is running, the port may not need to be flushed.  How long will my port stay implanted? The port can stay in for as long as your health care provider thinks it is needed. When it is time for the port to come out, surgery will be   done to remove it. The procedure is similar to the one performed when the port was put in. When should I seek immediate medical care? When you have an implanted port, you should seek immediate medical care if:  You notice a bad smell coming from the incision site.  You have swelling, redness, or drainage at the incision site.  You have more swelling or pain at the port site or the surrounding area.  You have a fever that is not controlled with medicine.  This information is not intended to replace advice given to you by your health care provider. Make sure you discuss any questions you have with your health care provider. Document  Released: 04/13/2005 Document Revised: 09/19/2015 Document Reviewed: 12/19/2012 Elsevier Interactive Patient Education  2017 [employer]  

## 2017-04-06 NOTE — Progress Notes (Signed)
Unable to measure oxygen saturation d/t false nails. Spoke with Dr. Alvy Bimler because pt noted slight dyspnea. Dr. Alvy Bimler okay to proceed with treatment today despite inability to measure oxygen saturation.

## 2017-04-06 NOTE — Patient Instructions (Signed)
Leuprolide depot injection What is this medicine? LEUPROLIDE (loo PROE lide) is a man-made protein that acts like a natural hormone in the body. It decreases testosterone in men and decreases estrogen in women. In men, this medicine is used to treat advanced prostate cancer. In women, some forms of this medicine may be used to treat endometriosis, uterine fibroids, or other female hormone-related problems. This medicine may be used for other purposes; ask your health care provider or pharmacist if you have questions. COMMON BRAND NAME(S): Eligard, Lupron Depot, Lupron Depot-Ped, Viadur What should I tell my health care provider before I take this medicine? They need to know if you have any of these conditions: -diabetes -heart disease or previous heart attack -high blood pressure -high cholesterol -mental illness -osteoporosis -pain or difficulty passing urine -seizures -spinal cord metastasis -stroke -suicidal thoughts, plans, or attempt; a previous suicide attempt by you or a family member -tobacco smoker -unusual vaginal bleeding (women) -an unusual or allergic reaction to leuprolide, benzyl alcohol, other medicines, foods, dyes, or preservatives -pregnant or trying to get pregnant -breast-feeding How should I use this medicine? This medicine is for injection into a muscle or for injection under the skin. It is given by a health care professional in a hospital or clinic setting. The specific product will determine how it will be given to you. Make sure you understand which product you receive and how often you will receive it. Talk to your pediatrician regarding the use of this medicine in children. Special care may be needed. Overdosage: If you think you have taken too much of this medicine contact a poison control center or emergency room at once. NOTE: This medicine is only for you. Do not share this medicine with others. What if I miss a dose? It is important not to miss a dose.  Call your doctor or health care professional if you are unable to keep an appointment. Depot injections: Depot injections are given either once-monthly, every 12 weeks, every 16 weeks, or every 24 weeks depending on the product you are prescribed. The product you are prescribed will be based on if you are female or female, and your condition. Make sure you understand your product and dosing. What may interact with this medicine? Do not take this medicine with any of the following medications: -chasteberry This medicine may also interact with the following medications: -herbal or dietary supplements, like black cohosh or DHEA -female hormones, like estrogens or progestins and birth control pills, patches, rings, or injections -female hormones, like testosterone This list may not describe all possible interactions. Give your health care provider a list of all the medicines, herbs, non-prescription drugs, or dietary supplements you use. Also tell them if you smoke, drink alcohol, or use illegal drugs. Some items may interact with your medicine. What should I watch for while using this medicine? Visit your doctor or health care professional for regular checks on your progress. During the first weeks of treatment, your symptoms may get worse, but then will improve as you continue your treatment. You may get hot flashes, increased bone pain, increased difficulty passing urine, or an aggravation of nerve symptoms. Discuss these effects with your doctor or health care professional, some of them may improve with continued use of this medicine. Female patients may experience a menstrual cycle or spotting during the first months of therapy with this medicine. If this continues, contact your doctor or health care professional. What side effects may I notice from receiving this medicine? Side   effects that you should report to your doctor or health care professional as soon as possible: -allergic reactions like skin  rash, itching or hives, swelling of the face, lips, or tongue -breathing problems -chest pain -depression or memory disorders -pain in your legs or groin -pain at site where injected or implanted -severe headache -swelling of the feet and legs -visual changes -vomiting Side effects that usually do not require medical attention (report to your doctor or health care professional if they continue or are bothersome): -breast swelling or tenderness -decrease in sex drive or performance -diarrhea -hot flashes -loss of appetite -muscle, joint, or bone pains -nausea -redness or irritation at site where injected or implanted -skin problems or acne This list may not describe all possible side effects. Call your doctor for medical advice about side effects. You may report side effects to FDA at 1-800-FDA-1088. Where should I keep my medicine? This drug is given in a hospital or clinic and will not be stored at home. NOTE: This sheet is a summary. It may not cover all possible information. If you have questions about this medicine, talk to your doctor, pharmacist, or health care provider.  2017 Elsevier/Gold Standard (2015-09-26 09:45:17) Pembrolizumab injection What is this medicine? PEMBROLIZUMAB (pem broe liz ue mab) is a monoclonal antibody. It is used to treat melanoma, head and neck cancer, Hodgkin lymphoma, non-small cell lung cancer, urothelial cancer, stomach cancer, and cancers that have a certain genetic condition. This medicine may be used for other purposes; ask your health care provider or pharmacist if you have questions. COMMON BRAND NAME(S): Keytruda What should I tell my health care provider before I take this medicine? They need to know if you have any of these conditions: -diabetes -immune system problems -inflammatory bowel disease -liver disease -lung or breathing disease -lupus -organ transplant -an unusual or allergic reaction to pembrolizumab, other medicines,  foods, dyes, or preservatives -pregnant or trying to get pregnant -breast-feeding How should I use this medicine? This medicine is for infusion into a vein. It is given by a health care professional in a hospital or clinic setting. A special MedGuide will be given to you before each treatment. Be sure to read this information carefully each time. Talk to your pediatrician regarding the use of this medicine in children. While this drug may be prescribed for selected conditions, precautions do apply. Overdosage: If you think you have taken too much of this medicine contact a poison control center or emergency room at once. NOTE: This medicine is only for you. Do not share this medicine with others. What if I miss a dose? It is important not to miss your dose. Call your doctor or health care professional if you are unable to keep an appointment. What may interact with this medicine? Interactions have not been studied. Give your health care provider a list of all the medicines, herbs, non-prescription drugs, or dietary supplements you use. Also tell them if you smoke, drink alcohol, or use illegal drugs. Some items may interact with your medicine. This list may not describe all possible interactions. Give your health care provider a list of all the medicines, herbs, non-prescription drugs, or dietary supplements you use. Also tell them if you smoke, drink alcohol, or use illegal drugs. Some items may interact with your medicine. What should I watch for while using this medicine? Your condition will be monitored carefully while you are receiving this medicine. You may need blood work done while you are taking this medicine. Do  not become pregnant while taking this medicine or for 4 months after stopping it. Women should inform their doctor if they wish to become pregnant or think they might be pregnant. There is a potential for serious side effects to an unborn child. Talk to your health care  professional or pharmacist for more information. Do not breast-feed an infant while taking this medicine or for 4 months after the last dose. What side effects may I notice from receiving this medicine? Side effects that you should report to your doctor or health care professional as soon as possible: -allergic reactions like skin rash, itching or hives, swelling of the face, lips, or tongue -bloody or black, tarry -breathing problems -changes in vision -chest pain -chills -constipation -cough -dizziness or feeling faint or lightheaded -fast or irregular heartbeat -fever -flushing -hair loss -low blood counts - this medicine may decrease the number of white blood cells, red blood cells and platelets. You may be at increased risk for infections and bleeding. -muscle pain -muscle weakness -persistent headache -signs and symptoms of high blood sugar such as dizziness; dry mouth; dry skin; fruity breath; nausea; stomach pain; increased hunger or thirst; increased urination -signs and symptoms of kidney injury like trouble passing urine or change in the amount of urine -signs and symptoms of liver injury like dark urine, light-colored stools, loss of appetite, nausea, right upper belly pain, yellowing of the eyes or skin -stomach pain -sweating -weight loss Side effects that usually do not require medical attention (report to your doctor or health care professional if they continue or are bothersome): -decreased appetite -diarrhea -tiredness This list may not describe all possible side effects. Call your doctor for medical advice about side effects. You may report side effects to FDA at 1-800-FDA-1088. Where should I keep my medicine? This drug is given in a hospital or clinic and will not be stored at home. NOTE: This sheet is a summary. It may not cover all possible information. If you have questions about this medicine, talk to your doctor, pharmacist, or health care provider.  2018  Elsevier/Gold Standard (2016-01-21 12:29:36)

## 2017-04-07 LAB — PREGNANCY, URINE: PREGNANCY TEST, URINE: NEGATIVE

## 2017-04-13 ENCOUNTER — Other Ambulatory Visit: Payer: Self-pay

## 2017-04-13 ENCOUNTER — Telehealth: Payer: Self-pay

## 2017-04-13 DIAGNOSIS — B20 Human immunodeficiency virus [HIV] disease: Secondary | ICD-10-CM

## 2017-04-13 MED ORDER — TRAMADOL HCL 50 MG PO TABS: 100.0000 mg | ORAL_TABLET | Freq: Four times a day (QID) | ORAL | 0 refills | Status: DC | PRN

## 2017-04-13 NOTE — Telephone Encounter (Signed)
Called and left message asking if we could call in Tramadol Rx on Thursday.

## 2017-04-13 NOTE — Telephone Encounter (Signed)
yes

## 2017-04-13 NOTE — Telephone Encounter (Signed)
Notified Rx called into pharmacy.

## 2017-04-14 ENCOUNTER — Other Ambulatory Visit: Payer: Self-pay

## 2017-04-14 ENCOUNTER — Telehealth: Payer: Self-pay | Admitting: Medical Oncology

## 2017-04-14 MED ORDER — LIDOCAINE-PRILOCAINE 2.5-2.5 % EX CREA: 1.0000 "application " | TOPICAL_CREAM | CUTANEOUS | 11 refills | Status: DC | PRN

## 2017-04-14 NOTE — Telephone Encounter (Signed)
Called [facility] regarding Rx request. She needs refill on EMLA cream and Potassium tablets. I do see Potassium tablets on med profile.

## 2017-04-14 NOTE — Telephone Encounter (Signed)
OK to refill EMLA No need potassium replacement. Recent potassium is OK

## 2017-04-14 NOTE — Telephone Encounter (Signed)
refills  

## 2017-04-14 NOTE — Telephone Encounter (Signed)
Called and given below message. Rx for EMLA cream sent to pharmacy.

## 2017-04-22 ENCOUNTER — Other Ambulatory Visit: Payer: Medicare Other

## 2017-04-23 ENCOUNTER — Telehealth: Payer: Self-pay

## 2017-04-23 NOTE — Telephone Encounter (Signed)
Called requesting refill on Tramadol Rx. Told her she just got a refill on 12/18 and it is not time.  She is having a lot of back pain and is taking 5 tablets of Tramadol  in the am and 5 tablets at night. Instructed her to take the Rx as directed. Instructed to go to urgent care or ER to be evaluated. Verbalized understanding, states she does not want to go out in the rain. Encouraged to go to ER or Urgent care.

## 2017-04-26 ENCOUNTER — Other Ambulatory Visit: Payer: Self-pay | Admitting: *Deleted

## 2017-04-26 DIAGNOSIS — B20 Human immunodeficiency virus [HIV] disease: Secondary | ICD-10-CM

## 2017-04-26 MED ORDER — TRAMADOL HCL 50 MG PO TABS: 100.0000 mg | ORAL_TABLET | Freq: Four times a day (QID) | ORAL | 0 refills | Status: DC | PRN

## 2017-04-29 ENCOUNTER — Other Ambulatory Visit: Payer: Self-pay | Admitting: Hematology and Oncology

## 2017-04-29 ENCOUNTER — Inpatient Hospital Stay: Payer: 59 | Attending: Hematology and Oncology | Admitting: Hematology and Oncology

## 2017-04-29 ENCOUNTER — Ambulatory Visit: Payer: 59

## 2017-04-29 ENCOUNTER — Encounter: Payer: Self-pay | Admitting: Hematology and Oncology

## 2017-04-29 ENCOUNTER — Ambulatory Visit (HOSPITAL_BASED_OUTPATIENT_CLINIC_OR_DEPARTMENT_OTHER): Payer: 59

## 2017-04-29 ENCOUNTER — Telehealth: Payer: Self-pay | Admitting: Hematology and Oncology

## 2017-04-29 ENCOUNTER — Other Ambulatory Visit (HOSPITAL_BASED_OUTPATIENT_CLINIC_OR_DEPARTMENT_OTHER): Payer: 59

## 2017-04-29 ENCOUNTER — Other Ambulatory Visit (HOSPITAL_COMMUNITY)
Admission: RE | Admit: 2017-04-29 | Discharge: 2017-04-29 | Disposition: A | Discharge status: Home / Self Care | Payer: 59 | Source: Ambulatory Visit | Attending: Hematology and Oncology | Admitting: Hematology and Oncology

## 2017-04-29 ENCOUNTER — Telehealth: Payer: Self-pay

## 2017-04-29 DIAGNOSIS — Z5111 Encounter for antineoplastic chemotherapy: Secondary | ICD-10-CM

## 2017-04-29 DIAGNOSIS — C8118 Nodular sclerosis classical Hodgkin lymphoma, lymph nodes of multiple sites: Secondary | ICD-10-CM

## 2017-04-29 DIAGNOSIS — Z79899 Other long term (current) drug therapy: Secondary | ICD-10-CM | POA: Insufficient documentation

## 2017-04-29 DIAGNOSIS — B20 Human immunodeficiency virus [HIV] disease: Secondary | ICD-10-CM | POA: Insufficient documentation

## 2017-04-29 DIAGNOSIS — R109 Unspecified abdominal pain: Secondary | ICD-10-CM | POA: Diagnosis not present

## 2017-04-29 DIAGNOSIS — R635 Abnormal weight gain: Secondary | ICD-10-CM | POA: Diagnosis not present

## 2017-04-29 DIAGNOSIS — C8190 Hodgkin lymphoma, unspecified, unspecified site: Secondary | ICD-10-CM | POA: Diagnosis present

## 2017-04-29 DIAGNOSIS — Z5112 Encounter for antineoplastic immunotherapy: Secondary | ICD-10-CM | POA: Diagnosis not present

## 2017-04-29 DIAGNOSIS — M25531 Pain in right wrist: Secondary | ICD-10-CM | POA: Insufficient documentation

## 2017-04-29 DIAGNOSIS — Z95828 Presence of other vascular implants and grafts: Secondary | ICD-10-CM

## 2017-04-29 DIAGNOSIS — C8198 Hodgkin lymphoma, unspecified, lymph nodes of multiple sites: Secondary | ICD-10-CM

## 2017-04-29 DIAGNOSIS — R1033 Periumbilical pain: Secondary | ICD-10-CM

## 2017-04-29 DIAGNOSIS — G8929 Other chronic pain: Secondary | ICD-10-CM | POA: Insufficient documentation

## 2017-04-29 LAB — COMPREHENSIVE METABOLIC PANEL
ALT: 19 U/L (ref 0–55)
AST: 19 U/L (ref 5–34)
Albumin: 4 g/dL (ref 3.5–5.0)
Alkaline Phosphatase: 81 U/L (ref 40–150)
Anion Gap: 10 mEq/L (ref 3–11)
BUN: 10.7 mg/dL (ref 7.0–26.0)
CHLORIDE: 107 meq/L (ref 98–109)
CO2: 23 mEq/L (ref 22–29)
Calcium: 9.8 mg/dL (ref 8.4–10.4)
Creatinine: 1.2 mg/dL — ABNORMAL HIGH (ref 0.6–1.1)
EGFR: >60 mL/min/{1.73_m2} (ref >60)
Glucose: 83 mg/dl (ref 70–140)
POTASSIUM: 3.3 meq/L — AB (ref 3.5–5.1)
SODIUM: 140 meq/L (ref 136–145)
Total Bilirubin: 1.31 mg/dL — ABNORMAL HIGH (ref 0.20–1.20)
Total Protein: 7.6 g/dL (ref 6.4–8.3)

## 2017-04-29 LAB — CBC WITH DIFFERENTIAL/PLATELET
BASO%: 0.7 % (ref 0.0–2.0)
BASOS ABS: 0.1 10*3/uL (ref 0.0–0.1)
EOS%: 2.5 % (ref 0.0–7.0)
Eosinophils Absolute: 0.2 10*3/uL (ref 0.0–0.5)
HCT: 38.1 % (ref 34.8–46.6)
HGB: 13.1 g/dL (ref 11.6–15.9)
LYMPH%: 16.1 % (ref 14.0–49.7)
MCH: 35.4 pg — AB (ref 25.1–34.0)
MCHC: 34.4 g/dL (ref 31.5–36.0)
MCV: 102.8 fL — ABNORMAL HIGH (ref 79.5–101.0)
MONO#: 0.5 10*3/uL (ref 0.1–0.9)
MONO%: 6.3 % (ref 0.0–14.0)
NEUT#: 5.5 10*3/uL (ref 1.5–6.5)
NEUT%: 74.4 % (ref 38.4–76.8)
Platelets: 247 10*3/uL (ref 145–400)
RBC: 3.7 10*6/uL (ref 3.70–5.45)
RDW: 15.8 % — ABNORMAL HIGH (ref 11.2–14.5)
WBC: 7.4 10*3/uL (ref 3.9–10.3)
lymph#: 1.2 10*3/uL (ref 0.9–3.3)

## 2017-04-29 LAB — TSH: TSH: 1.142 m(IU)/L (ref 0.308–3.960)

## 2017-04-29 LAB — PREGNANCY, URINE: Preg Test, Ur: NEGATIVE

## 2017-04-29 MED ORDER — HEPARIN SOD (PORK) LOCK FLUSH 100 UNIT/ML IV SOLN
500.0000 [IU] | Freq: Once | INTRAVENOUS | Status: AC | PRN
  Administered 2017-04-29: 500 [IU]
  Filled 2017-04-29: qty 5

## 2017-04-29 MED ORDER — SODIUM CHLORIDE 0.9 % IV SOLN
Freq: Once | INTRAVENOUS | Status: AC
  Administered 2017-04-29: 10:00:00 via INTRAVENOUS

## 2017-04-29 MED ORDER — SODIUM CHLORIDE 0.9% FLUSH
10.0000 mL | INTRAVENOUS | Status: DC | PRN
  Administered 2017-04-29: 10 mL
  Filled 2017-04-29: qty 10

## 2017-04-29 MED ORDER — SODIUM CHLORIDE 0.9 % IV SOLN
200.0000 mg | Freq: Once | INTRAVENOUS | Status: AC
  Administered 2017-04-29: 200 mg via INTRAVENOUS
  Filled 2017-04-29: qty 8

## 2017-04-29 MED ORDER — HYDROXYZINE HCL 10 MG PO TABS: 10.0000 mg | ORAL_TABLET | Freq: Three times a day (TID) | ORAL | 1 refills | Status: DC | PRN

## 2017-04-29 MED ORDER — DOXYCYCLINE HYCLATE 100 MG PO TABS: 100.0000 mg | ORAL_TABLET | Freq: Two times a day (BID) | ORAL | 0 refills | Status: DC

## 2017-04-29 MED ORDER — LIDOCAINE-PRILOCAINE 2.5-2.5 % EX CREA: 1.0000 "application " | TOPICAL_CREAM | CUTANEOUS | 11 refills | Status: DC | PRN

## 2017-04-29 MED ORDER — SODIUM CHLORIDE 0.9% FLUSH
10.0000 mL | INTRAVENOUS | Status: DC | PRN
  Administered 2017-04-29: 10 mL via INTRAVENOUS
  Filled 2017-04-29: qty 10

## 2017-04-29 NOTE — Progress Notes (Signed)
Pt states she would prefer to have Lupron injection at her next appointment on 05/20/2017, pharmacy made aware of pt decision.

## 2017-04-29 NOTE — Telephone Encounter (Signed)
Scheduled appt per 1/3 los - patient didn't want avs or calendar.

## 2017-04-29 NOTE — Assessment & Plan Note (Signed)
She will continue anti-retroviral treatment as directed

## 2017-04-29 NOTE — Patient Instructions (Signed)
[facility] Discharge Instructions for Patients Receiving Chemotherapy  Today you received the following chemotherapy agents:  Keytruda.  To help prevent nausea and vomiting after your treatment, we encourage you to take your nausea medication as directed.   If you develop nausea and vomiting that is not controlled by your nausea medication, call the clinic.   BELOW ARE SYMPTOMS THAT SHOULD BE REPORTED IMMEDIATELY:  *FEVER GREATER THAN 100.5 F  *CHILLS WITH OR WITHOUT FEVER  NAUSEA AND VOMITING THAT IS NOT CONTROLLED WITH YOUR NAUSEA MEDICATION  *UNUSUAL SHORTNESS OF BREATH  *UNUSUAL BRUISING OR BLEEDING  TENDERNESS IN MOUTH AND THROAT WITH OR WITHOUT PRESENCE OF ULCERS  *URINARY PROBLEMS  *BOWEL PROBLEMS  UNUSUAL RASH Items with * indicate a potential emergency and should be followed up as soon as possible.  Feel free to call the clinic should you have any questions or concerns. The clinic phone number is [phone].  Please show the CHEMO ALERT CARD at check-in to the Emergency Department and triage nurse.    

## 2017-04-29 NOTE — Telephone Encounter (Signed)
Per Dr Alvy Bimler, pt to get Lupron today as well. Verbal msg given to [facility], charge nurse in infusion.

## 2017-04-29 NOTE — Assessment & Plan Note (Signed)
She tolerated treatment well without side effects She has gained a lot of weight and I am concerned about possible acquired hypothyroidism TSH is pending We will proceed with treatment without dose adjustment I plan minimum 3 cycles of treatment before repeat PET/CT scan

## 2017-04-29 NOTE — Assessment & Plan Note (Signed)
She has intermittent abdominal pain, could be related to her cancer I recommend increasing tramadol to 100 mg every 6 hours as needed along with Tylenol extra strength The patient has declined narcotic prescription

## 2017-04-29 NOTE — Progress Notes (Signed)
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      05/07/2014 Imaging    CT scan of the chest show right thoracic inlet lymphadenopathy      06/07/2014 Procedure    [name] underwent ultrasound-guided core biopsy of the neck lymph node      06/07/2014 Pathology Results    Accession: [id number] biopsy confirmed diagnosis of Hodgkin lymphoma.      06/15/2014 Imaging    Echocardiogram showed preserved ejection fraction      07/09/2014 - 07/12/2014 Hospital Admission    [name] was admitted to the hospital for severe anemia.      07/27/2014 Procedure    [name] had placement of port      07/31/2014 - 09/11/2014 Chemotherapy    [name] received dose adjusted chemotherapy due to abnormal liver function tests and severe anemia. Treatment was delayed due to noncompliance  and subsequently stopped because the patient failed to keep appointments      01/11/2015 Imaging    Repeat PET CT scan showed response to treatment      01/28/2015 - 06/18/2015 Chemotherapy    ABVD was restarted with full dose.      02/08/2015 - 02/10/2015 Hospital Admission    The patient was admitted to the hospital due to pancytopenia and profuse diarrhea. Cultures were negative. [name] was placed on ciprofloxacin.      02/11/2015 Adverse Reaction    Treatment was placed on hold due to recent infection.      04/11/2015 Imaging    PET CT scan showed near complete response. Incidental finding of an abnormal bone  lesion, indeterminate. [name] is not symptomatic. Recommendation from [facility] to observe      07/11/2015 Imaging    PET CT scan showed abnormal new bone lesions, suggestive of possible disease progression      07/23/2015 Bone Marrow Biopsy    [name] underwent bone biopsy      07/23/2015 Pathology Results    Accession: [id number]  biopsy was negative for cancer      11/21/2015 Surgery    [name] had surgery for ectopic pregnancy      01/29/2016 Imaging    Ct chest, abdomen and pelvis showed pelvic and retroperitoneal lymphadenopathy, as above, concerning for residual disease. There is also a mildly enlarged posterior mediastinal lymph node measuring 11 mm adjacent to the distal descending thoracic aorta. This may represent an additional focus of disease, but is the only finding of concern in the thorax on today's examination. Sclerosis in the right ilium at site of previously noted metabolically active lesion, grossly unchanged. No other definite osseous lesions are identified on today's examination. Spleen is normal in size and appearance.      02/14/2016 PET scan    Interval disease worsening with new foci of hypermetabolic activity in multiple retroperitoneal and pelvic lymph nodes as well as AP window and left hilar lymph nodes. (Deauville 5). There is also overall worsening of the osseous disease.      05/11/2016 Pathology Results  Diagnosis Lymph node, needle/core biopsy, Left para-aortic retroperitoneal - CLASSICAL HODGKIN LYMPHOMA. - SEE ONCOLOGY TABLE. Microscopic Comment LYMPHOMA Histologic type: Classical Hodgkin lymphoma. Grade (if applicable): N/A Flow cytometry: Not done. Immunohistochemical stains: CD15, CD20, CD3, LCA, PAX-5, CD30 with appropriate controls. Touch preps/imprints: Not performed. Comments: The sections show small needle core biopsy fragments displaying a polymorphous cellular proliferation of small lymphocytes, plasma cells, eosinophils, and large atypical  mononuclear and multilobated lymphoid cells with features of Reed-Sternberg cells and variants. This is associated with patchy areas of fibrosis. Immunohistochemical stains were performed and show that the large atypical lymphoid cells are positive for CD30, CD15 and PAX-5 and negative for LCA, CD20, CD3. The small lymphoid cells in the background show a mixture of T and B cells with predominance of T cells. The overall morphologic and histologic features are consistent with classical Hodgkin lymphoma. Further subtyping is challenging in limited small biopsy fragments but the patchy fibrosis suggests nodular sclerosis type.       05/11/2016 Procedure    [name] underwent CT guided biopsy of retroperitoneal lymph node      05/26/2016 Procedure    Successful placement of a right IJ approach Power Port with ultrasound and fluoroscopic guidance. The catheter is ready for use.      06/02/2016 PET scan    Mixed response to chemotherapy with some lymph nodes decreased in metabolic activity and some lymph nodes increase metabolic activity. Lymph node stations including mediastinum, periaortic retroperitoneum, and obturator node stations. Activity is remains relatively intense Deauville 4 2. LEFT infrahilar nodule / lymph node with intense metabolic activity decreased from prior. ( Deauville 4 ). 3. 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 Deauville 5      06/03/2016 - 06/05/2016 Hospital Admission    [name] was admitted to the hospital for cycle 1 of ICE chemotherapy      06/24/2016 - 06/26/2016 Hospital Admission    [name] received cycle 2 of ICE chemo      07/07/2016 PET scan    Resolution of prior hypermetabolic adenopathy and resolution of prior osseous foci of hypermetabolic activity compatible with essentially complete response to therapy (Deauville 1). 2. Generalized reduced activity in the L4 vertebral body and the type of finding which would typically reflect prior radiation  therapy. 3. Stable septated fatty right pelvic lesion, possibly a dermoid or lipoma, not hypermetabolic      2/33/0076 PET scan    Hypermetabolic lesion along the L3 vertebral body and left posterior elements, max SUV 7.8 (Deauville 5). Hypermetabolic lesion along the left inferior pubic ramus, max SUV 4.9 (Deauville 4).  IMPRESSION: Prevascular lymphadenopathy, reflecting nodal recurrence (Deauville 4). Hypermetabolic osseous metastases involving the L3 vertebral body/posterior elements and left inferior pubic ramus (Deauville 4-5). Hypermetabolism along the endometrium, new, possibly reactive/physiologic. Consider pelvic ultrasound and/or endometrial sampling as clinically warranted.      11/04/2016 - 02/24/2017 Chemotherapy    [name] received Brentuximab      12/11/2016 PET scan    1. Mixed response to therapy within the skeleton. Lesions at L3 is decreased in size and metabolic activity. Residual activity remains above liver ( Deauville 4) 2. Increased activity in the LEFT sacrum with metabolic activity above liver activity ( Deauville 4 3. Decrease in size and metabolic activity of anterior mediastinal tissue consistent with resolution of thymic hyperplasia or resolution of lymphoma metabolic activity ( Deauville 2). 4. No new lymphadenopathy. Normal spleen and liver.      03/25/2017 PET scan  1. Increased metabolic activity in several normal sized retroperitoneal and right pelvic lymph nodes, Deauville 4. 2. Increase in size and metabolic activity of lesions in the L3 and L4 vertebral bodies and in the left sacrum, Deauville 5. 3. No recurrence of anterior mediastinal abnormal activity. 4. Other imaging findings of potential clinical significance: Chronic ethmoid and left maxillary sinusitis. Paraseptal emphysema at the lung apices. Right ovarian dermoid.      04/06/2017 -  Chemotherapy    The patient had Keytruda       INTERVAL HISTORY: Please see below for problem oriented  charting. [name] is seen prior to cycle 2 of chemotherapy [name] complained of intermittent lower back pain when [name] been over to the front It is alleviated by Tylenol [name] also has some skin itching, relieved by Atarax [name] denies fever or night sweats No recent new infection [name] has gained a lot of weight since the last time I saw her  REVIEW OF SYSTEMS:   Constitutional: Denies fevers, chills or abnormal weight loss Eyes: Denies blurriness of vision Ears, nose, mouth, throat, and face: Denies mucositis or sore throat Respiratory: Denies cough, dyspnea or wheezes Cardiovascular: Denies palpitation, chest discomfort or lower extremity swelling Gastrointestinal:  Denies nausea, heartburn or change in bowel habits Skin: Denies abnormal skin rashes Lymphatics: Denies new lymphadenopathy or easy bruising Neurological:Denies numbness, tingling or new weaknesses Behavioral/Psych: Mood is stable, no new changes  All other systems were reviewed with the patient and are negative.  I have reviewed the past medical history, past surgical history, social history and family history with the patient and they are unchanged from previous note.  ALLERGIES:  is allergic to temazepam.  MEDICATIONS:  Current Outpatient Medications  Medication Sig Dispense Refill  . atazanavir (REYATAZ) 300 MG capsule Take 1 capsule (300 mg total) by mouth daily with breakfast. Take with Norvir. 30 capsule 5  . cholecalciferol (VITAMIN D) 1000 units tablet Take 1 tablet (1,000 Units total) by mouth daily. 30 tablet 9  . dicyclomine (BENTYL) 20 MG tablet Take 1 tablet (20 mg total) by mouth 2 (two) times daily as needed for spasms. 20 tablet 0  . doxycycline (VIBRA-TABS) 100 MG tablet Take 1 tablet (100 mg total) by mouth 2 (two) times daily. 20 tablet 0  . dronabinol (MARINOL) 5 MG capsule Take 1 capsule (5 mg total) by mouth 2 (two) times daily before a meal. 60 capsule 5  . emtricitabine-tenofovir (TRUVADA) 200-300 MG tablet  Take 1 tablet by mouth daily. 30 tablet 5  . hydrOXYzine (ATARAX/VISTARIL) 10 MG tablet Take 1 tablet (10 mg total) by mouth 3 (three) times daily as needed. 90 tablet 1  . lidocaine-prilocaine (EMLA) cream Apply 1 application topically as needed. 30 g 11  . Polyvinyl Alcohol-Povidone (CLEAR EYES ALL SEASONS OP) Place 1 drop into both eyes daily.    . ritonavir (NORVIR) 100 MG TABS tablet Take 1 tablet by mouth every morning with breakfast. Take with Reyataz. 30 tablet 5  . traMADol (ULTRAM) 50 MG tablet Take 2 tablets (100 mg total) by mouth every 6 (six) hours as needed for moderate pain. 90 tablet 0  . traZODone (DESYREL) 50 MG tablet Take 1-2 tablets as needed at bedtime for sleep. 40 tablet 1  . triamcinolone cream (KENALOG) 0.1 % Apply 1 application topically 2 (two) times daily. (Patient taking differently: Apply 1 application topically 2 (two) times daily as needed (skin irritation). ) 30 g 0   No current facility-administered medications for this  visit.    Facility-Administered Medications Ordered in Other Visits  Medication Dose Route Frequency Provider Last Rate Last Dose  . 0.9 %  sodium chloride infusion   Intravenous Once Alvy Bimler, Nickolette Espinola, MD      . heparin lock flush 100 unit/mL  500 Units Intracatheter Once PRN Alvy Bimler, Joseline Mccampbell, MD      . pembrolizumab (KEYTRUDA) 200 mg in sodium chloride 0.9 % 50 mL chemo infusion  200 mg Intravenous Once Rudie Sermons, MD      . sodium chloride flush (NS) 0.9 % injection 10 mL  10 mL Intracatheter PRN Alvy Bimler, Aithana Kushner, MD        PHYSICAL EXAMINATION: ECOG PERFORMANCE STATUS: 1 - Symptomatic but completely ambulatory  Vitals:   04/29/17 0830  BP: 119/87  Pulse: 60  Resp: 17  Temp: 98.8 F (37.1 C)  SpO2: 100%   Filed Weights   04/29/17 0830  Weight: 120 lb 4.8 oz (54.6 kg)    GENERAL:alert, no distress and comfortable SKIN: skin color, texture, turgor are normal, no rashes or significant lesions EYES: normal, Conjunctiva are pink and  non-injected, sclera clear OROPHARYNX:no exudate, no erythema and lips, buccal mucosa, and tongue normal  NECK: supple, thyroid normal size, non-tender, without nodularity LYMPH:  no palpable lymphadenopathy in the cervical, axillary or inguinal LUNGS: clear to auscultation and percussion with normal breathing effort HEART: regular rate & rhythm and no murmurs and no lower extremity edema ABDOMEN:abdomen soft, non-tender and normal bowel sounds Musculoskeletal:no cyanosis of digits and no clubbing  NEURO: alert & oriented x 3 with fluent speech, no focal motor/sensory deficits  LABORATORY DATA:  I have reviewed the data as listed    Component Value Date/Time   NA 140 04/29/2017 0745   K 3.3 (L) 04/29/2017 0745   CL 107 03/27/2017 0305   CO2 23 04/29/2017 0745   GLUCOSE 83 04/29/2017 0745   BUN 10.7 04/29/2017 0745   CREATININE 1.2 (H) 04/29/2017 0745   CALCIUM 9.8 04/29/2017 0745   PROT 7.6 04/29/2017 0745   ALBUMIN 4.0 04/29/2017 0745   AST 19 04/29/2017 0745   ALT 19 04/29/2017 0745   ALKPHOS 81 04/29/2017 0745   BILITOT 1.31 (H) 04/29/2017 0745   GFRNONAA >60 03/27/2017 0305   GFRNONAA 83 02/20/2016 0908   GFRAA >60 03/27/2017 0305   GFRAA >89 02/20/2016 0908    No results found for: SPEP, UPEP  Lab Results  Component Value Date   WBC 7.4 04/29/2017   NEUTROABS 5.5 04/29/2017   HGB 13.1 04/29/2017   HCT 38.1 04/29/2017   MCV 102.8 (H) 04/29/2017   PLT 247 04/29/2017      Chemistry      Component Value Date/Time   NA 140 04/29/2017 0745   K 3.3 (L) 04/29/2017 0745   CL 107 03/27/2017 0305   CO2 23 04/29/2017 0745   BUN 10.7 04/29/2017 0745   CREATININE 1.2 (H) 04/29/2017 0745      Component Value Date/Time   CALCIUM 9.8 04/29/2017 0745   ALKPHOS 81 04/29/2017 0745   AST 19 04/29/2017 0745   ALT 19 04/29/2017 0745   BILITOT 1.31 (H) 04/29/2017 0745     ASSESSMENT & PLAN:  Hodgkin lymphoma, nodular sclerosis ([facility]) [name] tolerated treatment well without  side effects [name] has gained a lot of weight and I am concerned about possible acquired hypothyroidism TSH is pending We will proceed with treatment without dose adjustment I plan minimum 3 cycles of treatment before repeat PET/CT scan  Abdominal pain [name] has intermittent abdominal pain, could be related to her cancer I recommend increasing tramadol to 100 mg every 6 hours as needed along with Tylenol extra strength The patient has declined narcotic prescription   Human immunodeficiency virus (HIV) disease ([place]) [name] will continue anti-retroviral treatment as directed   No orders of the defined types were placed in this encounter.  All questions were answered. The patient knows to call the clinic with any problems, questions or concerns. No barriers to learning was detected. I spent 15 minutes counseling the patient face to face. The total time spent in the appointment was 20 minutes and more than 50% was on counseling and review of test results     Heath Lark, MD 04/29/2017 9:45 AM

## 2017-05-04 ENCOUNTER — Encounter: Payer: Self-pay | Admitting: Internal Medicine

## 2017-05-04 ENCOUNTER — Ambulatory Visit (INDEPENDENT_AMBULATORY_CARE_PROVIDER_SITE_OTHER): Payer: 59 | Admitting: Internal Medicine

## 2017-05-04 ENCOUNTER — Other Ambulatory Visit: Payer: Self-pay

## 2017-05-04 VITALS — BP 137/88 | HR 81 | Temp 98.3°F | Ht 64.0 in | Wt 121.0 lb

## 2017-05-04 DIAGNOSIS — Z7189 Other specified counseling: Secondary | ICD-10-CM | POA: Diagnosis not present

## 2017-05-04 DIAGNOSIS — Z7185 Encounter for immunization safety counseling: Secondary | ICD-10-CM

## 2017-05-04 DIAGNOSIS — B20 Human immunodeficiency virus [HIV] disease: Secondary | ICD-10-CM | POA: Diagnosis not present

## 2017-05-04 DIAGNOSIS — R21 Rash and other nonspecific skin eruption: Secondary | ICD-10-CM | POA: Diagnosis not present

## 2017-05-04 DIAGNOSIS — Z23 Encounter for immunization: Secondary | ICD-10-CM | POA: Insufficient documentation

## 2017-05-04 MED ORDER — PERMETHRIN 5 % EX CREA: 1.0000 "application " | TOPICAL_CREAM | Freq: Once | CUTANEOUS | 0 refills | Status: AC

## 2017-05-04 NOTE — Assessment & Plan Note (Signed)
Doing well.  Will continue q4 month follow up.

## 2017-05-04 NOTE — Assessment & Plan Note (Signed)
Offered flu shot, refused.  counseled

## 2017-05-04 NOTE — Assessment & Plan Note (Signed)
Pruritic rash of unknown etiology.  I will have her try some permethrin cream on the affected area, not near eyes, mouth.  Was off after 2-4 hours and do once. If no improvement, to continue the steroid lotion

## 2017-05-04 NOTE — Progress Notes (Signed)
   Subjective:    Patient ID: [name], female    DOB: March 13, 1986, 32 y.o.   MRN: [id number]  HPI Here for follow up of HIV Continues on Reyataz, norvir, Truvada and no missed doses.  She has been hesitant to change and with other mediations I have kept her on this regimen.  No missed doses.  CD4 of 650, viral load 24 copies.  Continues to see oncology, Dr. Alvy Bimler, for her  Hodgkin lymphoma.  Getting three cycles and then will repeat PET scan.  TSH recently checked and wnl. + weight gain.    Review of Systems  Constitutional: Negative for fatigue and fever.  Neurological: Negative for dizziness.       Objective:   Physical Exam  Constitutional: She appears well-developed and well-nourished. No distress.  HENT:  Mouth/Throat: No oropharyngeal exudate.  Eyes: No scleral icterus.  Cardiovascular: Normal rate, regular rhythm and normal heart sounds.  No murmur heard. Pulmonary/Chest: Effort normal and breath sounds normal. No respiratory distress.  Lymphadenopathy:    She has no cervical adenopathy.  Skin: No rash noted.          Assessment & Plan:

## 2017-05-07 ENCOUNTER — Telehealth: Payer: Self-pay | Admitting: *Deleted

## 2017-05-07 ENCOUNTER — Other Ambulatory Visit: Payer: Self-pay | Admitting: Hematology and Oncology

## 2017-05-07 DIAGNOSIS — B20 Human immunodeficiency virus [HIV] disease: Secondary | ICD-10-CM

## 2017-05-07 MED ORDER — TRAMADOL HCL 50 MG PO TABS: 100.0000 mg | ORAL_TABLET | Freq: Four times a day (QID) | ORAL | 0 refills | Status: DC | PRN

## 2017-05-07 NOTE — Telephone Encounter (Signed)
Bridie called requesting a refill of tramadol

## 2017-05-20 ENCOUNTER — Inpatient Hospital Stay: Payer: 59

## 2017-05-20 ENCOUNTER — Other Ambulatory Visit: Payer: Self-pay | Admitting: Hematology and Oncology

## 2017-05-20 ENCOUNTER — Encounter: Payer: Self-pay | Admitting: Hematology and Oncology

## 2017-05-20 ENCOUNTER — Inpatient Hospital Stay (HOSPITAL_BASED_OUTPATIENT_CLINIC_OR_DEPARTMENT_OTHER): Payer: 59 | Admitting: Hematology and Oncology

## 2017-05-20 DIAGNOSIS — C8118 Nodular sclerosis classical Hodgkin lymphoma, lymph nodes of multiple sites: Secondary | ICD-10-CM | POA: Diagnosis present

## 2017-05-20 DIAGNOSIS — M25531 Pain in right wrist: Secondary | ICD-10-CM | POA: Diagnosis not present

## 2017-05-20 DIAGNOSIS — Z95828 Presence of other vascular implants and grafts: Secondary | ICD-10-CM

## 2017-05-20 DIAGNOSIS — C8198 Hodgkin lymphoma, unspecified, lymph nodes of multiple sites: Secondary | ICD-10-CM

## 2017-05-20 DIAGNOSIS — Z5111 Encounter for antineoplastic chemotherapy: Secondary | ICD-10-CM | POA: Diagnosis present

## 2017-05-20 DIAGNOSIS — B20 Human immunodeficiency virus [HIV] disease: Secondary | ICD-10-CM

## 2017-05-20 DIAGNOSIS — Z5112 Encounter for antineoplastic immunotherapy: Secondary | ICD-10-CM | POA: Diagnosis present

## 2017-05-20 DIAGNOSIS — G8929 Other chronic pain: Secondary | ICD-10-CM

## 2017-05-20 DIAGNOSIS — Z79899 Other long term (current) drug therapy: Secondary | ICD-10-CM | POA: Diagnosis not present

## 2017-05-20 LAB — COMPREHENSIVE METABOLIC PANEL
ALBUMIN: 3.9 g/dL (ref 3.5–5.0)
ALT: 20 U/L (ref 0–55)
ANION GAP: 10 (ref 3–11)
AST: 20 U/L (ref 5–34)
Alkaline Phosphatase: 80 U/L (ref 40–150)
BUN: 11 mg/dL (ref 7–26)
CO2: 22 mmol/L (ref 22–29)
Calcium: 9.4 mg/dL (ref 8.4–10.4)
Chloride: 106 mmol/L (ref 98–109)
Creatinine, Ser: 0.92 mg/dL (ref 0.60–1.10)
GFR calc Af Amer: >60 mL/min (ref >60)
GFR calc non Af Amer: >60 mL/min (ref >60)
GLUCOSE: 85 mg/dL (ref 70–140)
POTASSIUM: 3.8 mmol/L (ref 3.3–4.7)
Sodium: 138 mmol/L (ref 136–145)
TOTAL PROTEIN: 7.2 g/dL (ref 6.4–8.3)
Total Bilirubin: 0.7 mg/dL (ref 0.2–1.2)

## 2017-05-20 LAB — CBC WITH DIFFERENTIAL/PLATELET
BASOS ABS: 0 10*3/uL (ref 0.0–0.1)
BASOS PCT: 0 %
EOS ABS: 0.2 10*3/uL (ref 0.0–0.5)
Eosinophils Relative: 2 %
HCT: 36.7 % (ref 34.8–46.6)
Hemoglobin: 12.7 g/dL (ref 11.6–15.9)
Lymphocytes Relative: 21 %
Lymphs Abs: 1.5 10*3/uL (ref 0.9–3.3)
MCH: 35.1 pg — ABNORMAL HIGH (ref 25.1–34.0)
MCHC: 34.6 g/dL (ref 31.5–36.0)
MCV: 101.4 fL — ABNORMAL HIGH (ref 79.5–101.0)
MONO ABS: 0.4 10*3/uL (ref 0.1–0.9)
MONOS PCT: 6 %
NEUTROS ABS: 5 10*3/uL (ref 1.5–6.5)
NEUTROS PCT: 71 %
Platelets: 270 10*3/uL (ref 145–400)
RBC: 3.62 MIL/uL — ABNORMAL LOW (ref 3.70–5.45)
RDW: 13.4 % (ref 11.2–16.1)
WBC: 7 10*3/uL (ref 3.9–10.3)

## 2017-05-20 LAB — PREGNANCY, URINE: Preg Test, Ur: NEGATIVE

## 2017-05-20 LAB — TSH: TSH: 0.929 u[IU]/mL (ref 0.308–3.960)

## 2017-05-20 MED ORDER — SODIUM CHLORIDE 0.9% FLUSH
10.0000 mL | INTRAVENOUS | Status: DC | PRN
  Administered 2017-05-20: 10 mL
  Filled 2017-05-20: qty 10

## 2017-05-20 MED ORDER — SODIUM CHLORIDE 0.9% FLUSH
10.0000 mL | INTRAVENOUS | Status: DC | PRN
  Administered 2017-05-20: 10 mL via INTRAVENOUS
  Filled 2017-05-20: qty 10

## 2017-05-20 MED ORDER — HEPARIN SOD (PORK) LOCK FLUSH 100 UNIT/ML IV SOLN
500.0000 [IU] | Freq: Once | INTRAVENOUS | Status: DC | PRN
  Filled 2017-05-20: qty 5

## 2017-05-20 MED ORDER — LEUPROLIDE ACETATE 7.5 MG IM KIT
7.5000 mg | PACK | Freq: Once | INTRAMUSCULAR | Status: AC
  Administered 2017-05-20: 7.5 mg via INTRAMUSCULAR
  Filled 2017-05-20: qty 7.5

## 2017-05-20 MED ORDER — SODIUM CHLORIDE 0.9 % IV SOLN
200.0000 mg | Freq: Once | INTRAVENOUS | Status: AC
  Administered 2017-05-20: 200 mg via INTRAVENOUS
  Filled 2017-05-20: qty 8

## 2017-05-20 MED ORDER — SODIUM CHLORIDE 0.9 % IV SOLN
Freq: Once | INTRAVENOUS | Status: AC
  Administered 2017-05-20: 10:00:00 via INTRAVENOUS

## 2017-05-20 MED ORDER — HEPARIN SOD (PORK) LOCK FLUSH 100 UNIT/ML IV SOLN
500.0000 [IU] | Freq: Once | INTRAVENOUS | Status: AC | PRN
  Administered 2017-05-20: 500 [IU]
  Filled 2017-05-20: qty 5

## 2017-05-20 MED ORDER — TRAMADOL HCL 50 MG PO TABS: 100.0000 mg | ORAL_TABLET | Freq: Four times a day (QID) | ORAL | 0 refills | Status: DC | PRN

## 2017-05-20 NOTE — Assessment & Plan Note (Signed)
She tolerated treatment well without side effects Night sweats has resolved We will proceed with treatment without dose adjustment I plan to repeat PET/CT scan next month to assess response to therapy

## 2017-05-20 NOTE — Patient Instructions (Signed)
[facility] Discharge Instructions for Patients Receiving Chemotherapy  Today you received the following chemotherapy agents :  Keytruda.  To help prevent nausea and vomiting after your treatment, we encourage you to take your nausea medication as prescribed.   If you develop nausea and vomiting that is not controlled by your nausea medication, call the clinic.   BELOW ARE SYMPTOMS THAT SHOULD BE REPORTED IMMEDIATELY:  *FEVER GREATER THAN 100.5 F  *CHILLS WITH OR WITHOUT FEVER  NAUSEA AND VOMITING THAT IS NOT CONTROLLED WITH YOUR NAUSEA MEDICATION  *UNUSUAL SHORTNESS OF BREATH  *UNUSUAL BRUISING OR BLEEDING  TENDERNESS IN MOUTH AND THROAT WITH OR WITHOUT PRESENCE OF ULCERS  *URINARY PROBLEMS  *BOWEL PROBLEMS  UNUSUAL RASH Items with * indicate a potential emergency and should be followed up as soon as possible.  Feel free to call the clinic should you have any questions or concerns. The clinic phone number is [phone].  Please show the CHEMO ALERT CARD at check-in to the Emergency Department and triage nurse.  

## 2017-05-20 NOTE — Assessment & Plan Note (Signed)
She has chronic wrist pain and diffuse musculoskeletal pain of unknown etiology. Examination is benign. I recommend vitamin D supplement. I refilled her prescription tramadol today.

## 2017-05-20 NOTE — Progress Notes (Signed)
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      05/07/2014 Imaging    CT scan of the chest show right thoracic inlet lymphadenopathy      06/07/2014 Procedure    She underwent ultrasound-guided core biopsy of the neck lymph node      06/07/2014 Pathology Results    Accession: [id number] biopsy confirmed diagnosis of Hodgkin lymphoma.      06/15/2014 Imaging    Echocardiogram showed preserved ejection fraction      07/09/2014 - 07/12/2014 Hospital Admission    She was admitted to the hospital for severe anemia.      07/27/2014 Procedure    She had placement of port      07/31/2014 - 09/11/2014 Chemotherapy    She received dose adjusted chemotherapy due to abnormal liver function tests and severe anemia. Treatment was delayed due to noncompliance  and subsequently stopped because the patient failed to keep appointments      01/11/2015 Imaging    Repeat PET CT scan showed response to treatment      01/28/2015 - 06/18/2015 Chemotherapy    ABVD was restarted with full dose.      02/08/2015 - 02/10/2015 Hospital Admission    The patient was admitted to the hospital due to pancytopenia and profuse diarrhea. Cultures were negative. She was placed on ciprofloxacin.      02/11/2015 Adverse Reaction    Treatment was placed on hold due to recent infection.      04/11/2015 Imaging    PET CT scan showed near complete response. Incidental finding of an abnormal bone  lesion, indeterminate. She is not symptomatic. Recommendation from [facility] to observe      07/11/2015 Imaging    PET CT scan showed abnormal new bone lesions, suggestive of possible disease progression      07/23/2015 Bone Marrow Biopsy    She underwent bone biopsy      07/23/2015 Pathology Results    Accession: [id number]  biopsy was negative for cancer      11/21/2015 Surgery    She had surgery for ectopic pregnancy      01/29/2016 Imaging    Ct chest, abdomen and pelvis showed pelvic and retroperitoneal lymphadenopathy, as above, concerning for residual disease. There is also a mildly enlarged posterior mediastinal lymph node measuring 11 mm adjacent to the distal descending thoracic aorta. This may represent an additional focus of disease, but is the only finding of concern in the thorax on today's examination. Sclerosis in the right ilium at site of previously noted metabolically active lesion, grossly unchanged. No other definite osseous lesions are identified on today's examination. Spleen is normal in size and appearance.      02/14/2016 PET scan    Interval disease worsening with new foci of hypermetabolic activity in multiple retroperitoneal and pelvic lymph nodes as well as AP window and left hilar lymph nodes. (Deauville 5). There is also overall worsening of the osseous disease.      05/11/2016 Pathology Results  Diagnosis Lymph node, needle/core biopsy, Left para-aortic retroperitoneal - CLASSICAL HODGKIN LYMPHOMA. - SEE ONCOLOGY TABLE. Microscopic Comment LYMPHOMA Histologic type: Classical Hodgkin lymphoma. Grade (if applicable): N/A Flow cytometry: Not done. Immunohistochemical stains: CD15, CD20, CD3, LCA, PAX-5, CD30 with appropriate controls. Touch preps/imprints: Not performed. Comments: The sections show small needle core biopsy fragments displaying a polymorphous cellular proliferation of small lymphocytes, plasma cells, eosinophils, and large atypical  mononuclear and multilobated lymphoid cells with features of Reed-Sternberg cells and variants. This is associated with patchy areas of fibrosis. Immunohistochemical stains were performed and show that the large atypical lymphoid cells are positive for CD30, CD15 and PAX-5 and negative for LCA, CD20, CD3. The small lymphoid cells in the background show a mixture of T and B cells with predominance of T cells. The overall morphologic and histologic features are consistent with classical Hodgkin lymphoma. Further subtyping is challenging in limited small biopsy fragments but the patchy fibrosis suggests nodular sclerosis type.       05/11/2016 Procedure    She underwent CT guided biopsy of retroperitoneal lymph node      05/26/2016 Procedure    Successful placement of a right IJ approach Power Port with ultrasound and fluoroscopic guidance. The catheter is ready for use.      06/02/2016 PET scan    Mixed response to chemotherapy with some lymph nodes decreased in metabolic activity and some lymph nodes increase metabolic activity. Lymph node stations including mediastinum, periaortic retroperitoneum, and obturator node stations. Activity is remains relatively intense Deauville 4 2. LEFT infrahilar nodule / lymph node with intense metabolic activity decreased from prior. ( Deauville 4 ). 3. 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 Deauville 5      06/03/2016 - 06/05/2016 Hospital Admission    She was admitted to the hospital for cycle 1 of ICE chemotherapy      06/24/2016 - 06/26/2016 Hospital Admission    She received cycle 2 of ICE chemo      07/07/2016 PET scan    Resolution of prior hypermetabolic adenopathy and resolution of prior osseous foci of hypermetabolic activity compatible with essentially complete response to therapy (Deauville 1). 2. Generalized reduced activity in the L4 vertebral body and the type of finding which would typically reflect prior radiation  therapy. 3. Stable septated fatty right pelvic lesion, possibly a dermoid or lipoma, not hypermetabolic      2/33/0076 PET scan    Hypermetabolic lesion along the L3 vertebral body and left posterior elements, max SUV 7.8 (Deauville 5). Hypermetabolic lesion along the left inferior pubic ramus, max SUV 4.9 (Deauville 4).  IMPRESSION: Prevascular lymphadenopathy, reflecting nodal recurrence (Deauville 4). Hypermetabolic osseous metastases involving the L3 vertebral body/posterior elements and left inferior pubic ramus (Deauville 4-5). Hypermetabolism along the endometrium, new, possibly reactive/physiologic. Consider pelvic ultrasound and/or endometrial sampling as clinically warranted.      11/04/2016 - 02/24/2017 Chemotherapy    She received Brentuximab      12/11/2016 PET scan    1. Mixed response to therapy within the skeleton. Lesions at L3 is decreased in size and metabolic activity. Residual activity remains above liver ( Deauville 4) 2. Increased activity in the LEFT sacrum with metabolic activity above liver activity ( Deauville 4 3. Decrease in size and metabolic activity of anterior mediastinal tissue consistent with resolution of thymic hyperplasia or resolution of lymphoma metabolic activity ( Deauville 2). 4. No new lymphadenopathy. Normal spleen and liver.      03/25/2017 PET scan  1. Increased metabolic activity in several normal sized retroperitoneal and right pelvic lymph nodes, Deauville 4. 2. Increase in size and metabolic activity of lesions in the L3 and L4 vertebral bodies and in the left sacrum, Deauville 5. 3. No recurrence of anterior mediastinal abnormal activity. 4. Other imaging findings of potential clinical significance: Chronic ethmoid and left maxillary sinusitis. Paraseptal emphysema at the lung apices. Right ovarian dermoid.      04/06/2017 -  Chemotherapy    The patient had Keytruda       INTERVAL HISTORY: Please see below for problem oriented  charting. She returns for further follow-up She denies recent infection She is compliant taking all her medications Her joint pain is stable She denies further night sweats.. She denies peripheral neuropathy No new lymphadenopathy  REVIEW OF SYSTEMS:   Constitutional: Denies fevers, chills or abnormal weight loss Eyes: Denies blurriness of vision Ears, nose, mouth, throat, and face: Denies mucositis or sore throat Respiratory: Denies cough, dyspnea or wheezes Cardiovascular: Denies palpitation, chest discomfort or lower extremity swelling Gastrointestinal:  Denies nausea, heartburn or change in bowel habits Skin: Denies abnormal skin rashes Lymphatics: Denies new lymphadenopathy or easy bruising Neurological:Denies numbness, tingling or new weaknesses Behavioral/Psych: Mood is stable, no new changes  All other systems were reviewed with the patient and are negative.  I have reviewed the past medical history, past surgical history, social history and family history with the patient and they are unchanged from previous note.  ALLERGIES:  is allergic to temazepam.  MEDICATIONS:  Current Outpatient Medications  Medication Sig Dispense Refill  . atazanavir (REYATAZ) 300 MG capsule Take 1 capsule (300 mg total) by mouth daily with breakfast. Take with Norvir. 30 capsule 5  . cholecalciferol (VITAMIN D) 1000 units tablet Take 1 tablet (1,000 Units total) by mouth daily. 30 tablet 9  . dicyclomine (BENTYL) 20 MG tablet Take 1 tablet (20 mg total) by mouth 2 (two) times daily as needed for spasms. 20 tablet 0  . doxycycline (VIBRA-TABS) 100 MG tablet Take 1 tablet (100 mg total) by mouth 2 (two) times daily. 20 tablet 0  . dronabinol (MARINOL) 5 MG capsule Take 1 capsule (5 mg total) by mouth 2 (two) times daily before a meal. 60 capsule 5  . emtricitabine-tenofovir (TRUVADA) 200-300 MG tablet Take 1 tablet by mouth daily. 30 tablet 5  . hydrOXYzine (ATARAX/VISTARIL) 10 MG tablet Take 1  tablet (10 mg total) by mouth 3 (three) times daily as needed. 90 tablet 1  . lidocaine-prilocaine (EMLA) cream Apply 1 application topically as needed. 30 g 11  . Polyvinyl Alcohol-Povidone (CLEAR EYES ALL SEASONS OP) Place 1 drop into both eyes daily.    . ritonavir (NORVIR) 100 MG TABS tablet Take 1 tablet by mouth every morning with breakfast. Take with Reyataz. 30 tablet 5  . traMADol (ULTRAM) 50 MG tablet Take 2 tablets (100 mg total) by mouth every 6 (six) hours as needed for moderate pain. 90 tablet 0  . traZODone (DESYREL) 50 MG tablet Take 1-2 tablets as needed at bedtime for sleep. 40 tablet 1  . triamcinolone cream (KENALOG) 0.1 % Apply 1 application topically 2 (two) times daily. (Patient taking differently: Apply 1 application topically 2 (two) times daily as needed (skin irritation). ) 30 g 0   No current facility-administered medications for this visit.     PHYSICAL EXAMINATION: ECOG PERFORMANCE STATUS: 1 - Symptomatic but completely ambulatory  Vitals:   05/20/17 0836  BP: 107/73  Pulse: 72  Resp: 18  Temp: 98.9 F (37.2 C)  SpO2: 95%   Filed Weights   05/20/17 0836  Weight: 122 lb 3.2 oz (55.4 kg)    GENERAL:alert, no distress and comfortable SKIN: skin color, texture, turgor are normal, no rashes or significant lesions EYES: normal, Conjunctiva are pink and non-injected, sclera clear OROPHARYNX:no exudate, no erythema and lips, buccal mucosa, and tongue normal  NECK: supple, thyroid normal size, non-tender, without nodularity LYMPH:  no palpable lymphadenopathy in the cervical, axillary or inguinal LUNGS: clear to auscultation and percussion with normal breathing effort HEART: regular rate & rhythm and no murmurs and no lower extremity edema ABDOMEN:abdomen soft, non-tender and normal bowel sounds Musculoskeletal:no cyanosis of digits and no clubbing  NEURO: alert & oriented x 3 with fluent speech, no focal motor/sensory deficits  LABORATORY DATA:  I have  reviewed the data as listed    Component Value Date/Time   NA 138 05/20/2017 0758   NA 140 04/29/2017 0745   K 3.8 05/20/2017 0758   K 3.3 (L) 04/29/2017 0745   CL 106 05/20/2017 0758   CO2 22 05/20/2017 0758   CO2 23 04/29/2017 0745   GLUCOSE 85 05/20/2017 0758   GLUCOSE 83 04/29/2017 0745   BUN 11 05/20/2017 0758   BUN 10.7 04/29/2017 0745   CREATININE 0.92 05/20/2017 0758   CREATININE 1.2 (H) 04/29/2017 0745   CALCIUM 9.4 05/20/2017 0758   CALCIUM 9.8 04/29/2017 0745   PROT 7.2 05/20/2017 0758   PROT 7.6 04/29/2017 0745   ALBUMIN 3.9 05/20/2017 0758   ALBUMIN 4.0 04/29/2017 0745   AST 20 05/20/2017 0758   AST 19 04/29/2017 0745   ALT 20 05/20/2017 0758   ALT 19 04/29/2017 0745   ALKPHOS 80 05/20/2017 0758   ALKPHOS 81 04/29/2017 0745   BILITOT 0.7 05/20/2017 0758   BILITOT 1.31 (H) 04/29/2017 0745   GFRNONAA >60 05/20/2017 0758   GFRNONAA 83 02/20/2016 0908   GFRAA >60 05/20/2017 0758   GFRAA >89 02/20/2016 0908    No results found for: SPEP, UPEP  Lab Results  Component Value Date   WBC 7.0 05/20/2017   NEUTROABS 5.0 05/20/2017   HGB 12.7 05/20/2017   HCT 36.7 05/20/2017   MCV 101.4 (H) 05/20/2017   PLT 270 05/20/2017      Chemistry      Component Value Date/Time   NA 138 05/20/2017 0758   NA 140 04/29/2017 0745   K 3.8 05/20/2017 0758   K 3.3 (L) 04/29/2017 0745   CL 106 05/20/2017 0758   CO2 22 05/20/2017 0758   CO2 23 04/29/2017 0745   BUN 11 05/20/2017 0758   BUN 10.7 04/29/2017 0745   CREATININE 0.92 05/20/2017 0758   CREATININE 1.2 (H) 04/29/2017 0745      Component Value Date/Time   CALCIUM 9.4 05/20/2017 0758   CALCIUM 9.8 04/29/2017 0745   ALKPHOS 80 05/20/2017 0758   ALKPHOS 81 04/29/2017 0745   AST 20 05/20/2017 0758   AST 19 04/29/2017 0745   ALT 20 05/20/2017 0758   ALT 19 04/29/2017 0745   BILITOT 0.7 05/20/2017 0758   BILITOT 1.31 (H) 04/29/2017 0745       ASSESSMENT & PLAN:  Hodgkin lymphoma, nodular sclerosis  (H[facility]) She tolerated treatment well without side effects Night sweats has resolved We will proceed with treatment without dose adjustment I plan to repeat PET/CT scan next month to assess response to therapy  Human immunodeficiency virus (HIV) disease ([place]) Her last  HIV viral load was mildly detected She will continue anti-retroviral treatment as directed  Chronic pain of right wrist She has chronic wrist pain and diffuse musculoskeletal pain of unknown etiology. Examination is benign. I recommend vitamin D supplement. I refilled her prescription tramadol today.   No orders of the defined types were placed in this encounter.  All questions were answered. The patient knows to call the clinic with any problems, questions or concerns. No barriers to learning was detected. I spent 15 minutes counseling the patient face to face. The total time spent in the appointment was 20 minutes and more than 50% was on counseling and review of test results     Heath Lark, MD 05/20/2017 9:30 AM

## 2017-05-20 NOTE — Assessment & Plan Note (Signed)
Her last HIV viral load was mildly detected She will continue anti-retroviral treatment as directed

## 2017-05-21 ENCOUNTER — Telehealth: Payer: Self-pay | Admitting: Hematology and Oncology

## 2017-05-21 NOTE — Telephone Encounter (Signed)
Return for no new orders or visits per 1/24 los.

## 2017-05-31 ENCOUNTER — Telehealth: Payer: Self-pay

## 2017-05-31 ENCOUNTER — Other Ambulatory Visit: Payer: Self-pay | Admitting: Hematology and Oncology

## 2017-05-31 DIAGNOSIS — B20 Human immunodeficiency virus [HIV] disease: Secondary | ICD-10-CM

## 2017-05-31 MED ORDER — TRAMADOL HCL 50 MG PO TABS: 100.0000 mg | ORAL_TABLET | Freq: Four times a day (QID) | ORAL | 0 refills | Status: DC | PRN

## 2017-05-31 NOTE — Telephone Encounter (Signed)
Called and left message requesting Tramadol refill. 

## 2017-05-31 NOTE — Telephone Encounter (Signed)
Called and told Rx ready for pick-up. 

## 2017-06-01 ENCOUNTER — Other Ambulatory Visit: Payer: Self-pay

## 2017-06-01 ENCOUNTER — Emergency Department (HOSPITAL_COMMUNITY)
Admission: EM | Admit: 2017-06-01 | Discharge: 2017-06-01 | Discharge status: Against Medical Advice | Payer: 59 | Attending: Emergency Medicine | Admitting: Emergency Medicine

## 2017-06-01 ENCOUNTER — Encounter (HOSPITAL_COMMUNITY): Payer: Self-pay | Admitting: Emergency Medicine

## 2017-06-01 DIAGNOSIS — Z5321 Procedure and treatment not carried out due to patient leaving prior to being seen by health care provider: Secondary | ICD-10-CM | POA: Insufficient documentation

## 2017-06-01 DIAGNOSIS — K0889 Other specified disorders of teeth and supporting structures: Secondary | ICD-10-CM | POA: Insufficient documentation

## 2017-06-01 NOTE — ED Triage Notes (Signed)
Pt reports left upper mouth dental pain for 1 week.Marland Kitchen

## 2017-06-01 NOTE — ED Notes (Signed)
Patient called did not answer

## 2017-06-09 ENCOUNTER — Ambulatory Visit (HOSPITAL_COMMUNITY)
Admission: RE | Admit: 2017-06-09 | Discharge: 2017-06-09 | Disposition: A | Discharge status: Home / Self Care | Payer: 59 | Source: Ambulatory Visit | Attending: Hematology and Oncology | Admitting: Hematology and Oncology

## 2017-06-09 DIAGNOSIS — E320 Persistent hyperplasia of thymus: Secondary | ICD-10-CM | POA: Diagnosis not present

## 2017-06-09 DIAGNOSIS — J439 Emphysema, unspecified: Secondary | ICD-10-CM | POA: Insufficient documentation

## 2017-06-09 DIAGNOSIS — M899 Disorder of bone, unspecified: Secondary | ICD-10-CM | POA: Insufficient documentation

## 2017-06-09 DIAGNOSIS — C8118 Nodular sclerosis classical Hodgkin lymphoma, lymph nodes of multiple sites: Secondary | ICD-10-CM | POA: Diagnosis present

## 2017-06-09 LAB — GLUCOSE, CAPILLARY: GLUCOSE-CAPILLARY: 86 mg/dL (ref 65–99)

## 2017-06-09 MED ORDER — FLUDEOXYGLUCOSE F - 18 (FDG) INJECTION
6.1000 | Freq: Once | INTRAVENOUS | Status: AC | PRN
  Administered 2017-06-09: 6.1 via INTRAVENOUS

## 2017-06-10 ENCOUNTER — Inpatient Hospital Stay: Payer: 59 | Attending: Hematology and Oncology | Admitting: Hematology and Oncology

## 2017-06-10 ENCOUNTER — Inpatient Hospital Stay: Payer: 59

## 2017-06-10 ENCOUNTER — Telehealth: Payer: Self-pay | Admitting: Hematology and Oncology

## 2017-06-10 ENCOUNTER — Encounter: Payer: Self-pay | Admitting: Hematology and Oncology

## 2017-06-10 DIAGNOSIS — M25531 Pain in right wrist: Secondary | ICD-10-CM | POA: Insufficient documentation

## 2017-06-10 DIAGNOSIS — Z79899 Other long term (current) drug therapy: Secondary | ICD-10-CM | POA: Diagnosis not present

## 2017-06-10 DIAGNOSIS — Z5111 Encounter for antineoplastic chemotherapy: Secondary | ICD-10-CM | POA: Diagnosis present

## 2017-06-10 DIAGNOSIS — C8118 Nodular sclerosis classical Hodgkin lymphoma, lymph nodes of multiple sites: Secondary | ICD-10-CM

## 2017-06-10 DIAGNOSIS — F17290 Nicotine dependence, other tobacco product, uncomplicated: Secondary | ICD-10-CM | POA: Insufficient documentation

## 2017-06-10 DIAGNOSIS — Z5112 Encounter for antineoplastic immunotherapy: Secondary | ICD-10-CM | POA: Diagnosis present

## 2017-06-10 DIAGNOSIS — G8929 Other chronic pain: Secondary | ICD-10-CM | POA: Diagnosis not present

## 2017-06-10 DIAGNOSIS — Z7189 Other specified counseling: Secondary | ICD-10-CM | POA: Insufficient documentation

## 2017-06-10 DIAGNOSIS — B20 Human immunodeficiency virus [HIV] disease: Secondary | ICD-10-CM | POA: Insufficient documentation

## 2017-06-10 DIAGNOSIS — C8198 Hodgkin lymphoma, unspecified, lymph nodes of multiple sites: Secondary | ICD-10-CM

## 2017-06-10 DIAGNOSIS — Z95828 Presence of other vascular implants and grafts: Secondary | ICD-10-CM

## 2017-06-10 LAB — CBC WITH DIFFERENTIAL/PLATELET
Basophils Absolute: 0 10*3/uL (ref 0.0–0.1)
Basophils Relative: 0 %
EOS ABS: 0.2 10*3/uL (ref 0.0–0.5)
EOS PCT: 4 %
HCT: 39.1 % (ref 34.8–46.6)
Hemoglobin: 13.6 g/dL (ref 11.6–15.9)
LYMPHS ABS: 1.4 10*3/uL (ref 0.9–3.3)
Lymphocytes Relative: 29 %
MCH: 35.5 pg — AB (ref 25.1–34.0)
MCHC: 34.8 g/dL (ref 31.5–36.0)
MCV: 102.1 fL — ABNORMAL HIGH (ref 79.5–101.0)
MONO ABS: 0.4 10*3/uL (ref 0.1–0.9)
MONOS PCT: 8 %
Neutro Abs: 2.9 10*3/uL (ref 1.5–6.5)
Neutrophils Relative %: 59 %
PLATELETS: 274 10*3/uL (ref 145–400)
RBC: 3.83 MIL/uL (ref 3.70–5.45)
RDW: 13.4 % (ref 11.2–14.5)
WBC: 4.9 10*3/uL (ref 3.9–10.3)

## 2017-06-10 LAB — COMPREHENSIVE METABOLIC PANEL
ALT: 14 U/L (ref 0–55)
ANION GAP: 9 (ref 3–11)
AST: 19 U/L (ref 5–34)
Albumin: 4 g/dL (ref 3.5–5.0)
Alkaline Phosphatase: 79 U/L (ref 40–150)
BUN: 9 mg/dL (ref 7–26)
CALCIUM: 9.8 mg/dL (ref 8.4–10.4)
CHLORIDE: 107 mmol/L (ref 98–109)
CO2: 23 mmol/L (ref 22–29)
Creatinine, Ser: 1.02 mg/dL (ref 0.60–1.10)
GFR calc non Af Amer: >60 mL/min (ref >60)
Glucose, Bld: 90 mg/dL (ref 70–140)
Potassium: 3.8 mmol/L (ref 3.5–5.1)
SODIUM: 139 mmol/L (ref 136–145)
Total Bilirubin: 1.5 mg/dL — ABNORMAL HIGH (ref 0.2–1.2)
Total Protein: 7.2 g/dL (ref 6.4–8.3)

## 2017-06-10 LAB — TSH: TSH: 0.766 u[IU]/mL (ref 0.308–3.960)

## 2017-06-10 LAB — PREGNANCY, URINE: PREG TEST UR: NEGATIVE

## 2017-06-10 MED ORDER — SODIUM CHLORIDE 0.9% FLUSH
10.0000 mL | INTRAVENOUS | Status: DC | PRN
  Administered 2017-06-10: 10 mL
  Filled 2017-06-10: qty 10

## 2017-06-10 MED ORDER — HEPARIN SOD (PORK) LOCK FLUSH 100 UNIT/ML IV SOLN
500.0000 [IU] | Freq: Once | INTRAVENOUS | Status: AC | PRN
  Administered 2017-06-10: 500 [IU]
  Filled 2017-06-10: qty 5

## 2017-06-10 MED ORDER — SODIUM CHLORIDE 0.9% FLUSH
10.0000 mL | INTRAVENOUS | Status: DC | PRN
  Administered 2017-06-10: 10 mL via INTRAVENOUS
  Filled 2017-06-10: qty 10

## 2017-06-10 MED ORDER — SODIUM CHLORIDE 0.9 % IV SOLN
200.0000 mg | Freq: Once | INTRAVENOUS | Status: AC
  Administered 2017-06-10: 200 mg via INTRAVENOUS
  Filled 2017-06-10: qty 8

## 2017-06-10 MED ORDER — SODIUM CHLORIDE 0.9 % IV SOLN
Freq: Once | INTRAVENOUS | Status: AC
Start: 2017-06-10 — End: 2017-06-10
  Administered 2017-06-10: 09:00:00 via INTRAVENOUS

## 2017-06-10 MED ORDER — TRAMADOL HCL 50 MG PO TABS: 100.0000 mg | ORAL_TABLET | Freq: Four times a day (QID) | ORAL | 0 refills | Status: DC | PRN

## 2017-06-10 NOTE — Assessment & Plan Note (Signed)
The patient is no longer depressed She appears motivated to live healthy We discussed goals of care She would likely remain on this treatment indefinitely as long as it continues to treat her lymphoma

## 2017-06-10 NOTE — Assessment & Plan Note (Signed)
The patient is delighted to see positive response to treatment We will continue treatment indefinitely I plan to scan her every 3 months, next imaging study would be around May 2019.

## 2017-06-10 NOTE — Patient Instructions (Signed)
[facility] Discharge Instructions for Patients Receiving Chemotherapy  Today you received the following chemotherapy agents:  Keytruda.  To help prevent nausea and vomiting after your treatment, we encourage you to take your nausea medication as directed.   If you develop nausea and vomiting that is not controlled by your nausea medication, call the clinic.   BELOW ARE SYMPTOMS THAT SHOULD BE REPORTED IMMEDIATELY:  *FEVER GREATER THAN 100.5 F  *CHILLS WITH OR WITHOUT FEVER  NAUSEA AND VOMITING THAT IS NOT CONTROLLED WITH YOUR NAUSEA MEDICATION  *UNUSUAL SHORTNESS OF BREATH  *UNUSUAL BRUISING OR BLEEDING  TENDERNESS IN MOUTH AND THROAT WITH OR WITHOUT PRESENCE OF ULCERS  *URINARY PROBLEMS  *BOWEL PROBLEMS  UNUSUAL RASH Items with * indicate a potential emergency and should be followed up as soon as possible.  Feel free to call the clinic should you have any questions or concerns. The clinic phone number is [phone].  Please show the CHEMO ALERT CARD at check-in to the Emergency Department and triage nurse.    

## 2017-06-10 NOTE — Assessment & Plan Note (Signed)
She has poor dentition requiring possible dental extraction I recommend her to quit cigar smoking

## 2017-06-10 NOTE — Telephone Encounter (Signed)
Gave patient AVS and calendar of upcoming March appointments.  °

## 2017-06-10 NOTE — Progress Notes (Signed)
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  Human immunodeficiency virus (HIV) disease ([place]) Her last HIV viral load was mildly detected She will continue anti-retroviral treatment as directed  Chronic pain of right wrist She has chronic wrist pain and diffuse musculoskeletal pain of unknown etiology but could be due to her cancer Examination is benign. I recommend vitamin D supplement. I refilled her prescription tramadol today.  Cigar smoker She has poor dentition requiring possible dental extraction I recommend her to quit cigar smoking  Goals of care, counseling/discussion The patient is no longer depressed She appears motivated to live healthy We discussed goals of care She would likely remain on this treatment indefinitely as long as it continues to treat her lymphoma   No orders of the defined types were placed in this encounter.   INTERVAL HISTORY: Please see below for problem oriented charting. She returns for further follow-up and review of test results and chemotherapy She complained of feeling fatigue but she has good energy and appetite.  She is gaining weight. She had recent dental issue that may require dental extraction.  She continues to have chronic joint pain, controlled with tramadol She denies recent fever, chills or cough.  Overall, she tolerated  treatment well.  SUMMARY OF ONCOLOGIC HISTORY:   Hodgkin lymphoma, nodular sclerosis ([place])   05/06/2014 Imaging    CT scan of the abdomen show diffuse mesenteric lymphadenopathy.      05/07/2014 Imaging    CT scan of the chest show right thoracic inlet lymphadenopathy      06/07/2014 Procedure    She underwent ultrasound-guided core biopsy of the neck lymph node      06/07/2014 Pathology Results    Accession: [id number] biopsy confirmed diagnosis of Hodgkin lymphoma.      06/15/2014 Imaging    Echocardiogram showed preserved ejection fraction      07/09/2014 - 07/12/2014 Hospital Admission    She was admitted to the hospital for severe anemia.      07/27/2014 Procedure    She had placement of port      07/31/2014 - 09/11/2014 Chemotherapy    She received dose adjusted chemotherapy due to abnormal liver function tests and severe anemia. Treatment was delayed due to noncompliance  and subsequently stopped because the patient failed to keep appointments      01/11/2015 Imaging    Repeat PET CT scan showed response to treatment      01/28/2015 - 06/18/2015 Chemotherapy    ABVD was restarted with full dose.      02/08/2015 - 02/10/2015 Hospital Admission    The patient was admitted to the hospital due to pancytopenia and profuse diarrhea. Cultures were negative. She was placed on ciprofloxacin.      02/11/2015 Adverse Reaction    Treatment was placed on hold due to recent infection.      04/11/2015 Imaging    PET CT scan showed near complete response. Incidental finding of an  abnormal bone lesion, indeterminate. She is not symptomatic. Recommendation from Hem [facility] to observe      07/11/2015 Imaging    PET CT scan showed abnormal new bone lesions, suggestive of possible disease progression      07/23/2015 Bone Marrow Biopsy    She underwent bone biopsy      07/23/2015 Pathology Results    Accession: [id number]  biopsy was negative for cancer      11/21/2015 Surgery    She had  surgery for ectopic pregnancy      01/29/2016 Imaging    Ct chest, abdomen and pelvis showed pelvic and retroperitoneal lymphadenopathy, as above, concerning for residual disease. There is also a mildly enlarged posterior mediastinal lymph node measuring 11 mm adjacent to the distal descending thoracic aorta. This may represent an additional focus of disease, but is the only finding of concern in the thorax on today's examination. Sclerosis in the right ilium at site of previously noted metabolically active lesion, grossly unchanged. No other definite osseous lesions are identified on today's examination. Spleen is normal in size and appearance.      02/14/2016 PET scan    Interval disease worsening with new foci of hypermetabolic activity in multiple retroperitoneal and pelvic lymph nodes as well as AP window and left hilar lymph nodes. (Deauville 5). There is also overall worsening of the osseous disease.      05/11/2016 Pathology Results    Diagnosis Lymph node, needle/core biopsy, Left para-aortic retroperitoneal - CLASSICAL HODGKIN LYMPHOMA. - SEE ONCOLOGY TABLE. Microscopic Comment LYMPHOMA Histologic type: Classical Hodgkin lymphoma. Grade (if applicable): N/A Flow cytometry: Not done. Immunohistochemical stains: CD15, CD20, CD3, LCA, PAX-5, CD30 with appropriate controls. Touch preps/imprints: Not performed. Comments: The sections show small needle core biopsy fragments displaying a polymorphous cellular proliferation of small lymphocytes, plasma cells, eosinophils, and large atypical mononuclear and multilobated lymphoid cells with features of Reed-Sternberg cells and variants. This is associated with patchy areas of fibrosis. Immunohistochemical stains were performed and show that the large atypical lymphoid cells are positive for CD30, CD15 and PAX-5 and negative for LCA, CD20, CD3. The small lymphoid cells in the background show a mixture of T and B cells with predominance of T  cells. The overall morphologic and histologic features are consistent with classical Hodgkin lymphoma. Further subtyping is challenging in limited small biopsy fragments but the patchy fibrosis suggests nodular sclerosis type.       05/11/2016 Procedure    She underwent CT guided biopsy of retroperitoneal lymph node      05/26/2016 Procedure    Successful placement of a right IJ approach Power Port with ultrasound and fluoroscopic guidance. The catheter is ready for use.      06/02/2016 PET scan    Mixed response to chemotherapy with some lymph nodes decreased in metabolic activity and some lymph nodes increase metabolic activity. Lymph node stations including mediastinum, periaortic retroperitoneum, and obturator node stations. Activity is remains relatively intense Deauville 4 2. LEFT infrahilar nodule / lymph node with intense metabolic activity decreased from prior. ( Deauville 4 ). 3. 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 Deauville 5      06/03/2016 - 06/05/2016 Hospital Admission    She was admitted to the hospital for cycle 1 of ICE chemotherapy      06/24/2016 - 06/26/2016 Hospital Admission    She received cycle 2 of ICE chemo      07/07/2016 PET scan    Resolution of prior hypermetabolic  adenopathy and resolution of prior osseous foci of hypermetabolic activity compatible with essentially complete response to therapy (Deauville 1). 2. Generalized reduced activity in the L4 vertebral body and the type of finding which would typically reflect prior radiation therapy. 3. Stable septated fatty right pelvic lesion, possibly a dermoid or lipoma, not hypermetabolic      0/92/3300 PET scan    Hypermetabolic lesion along the L3 vertebral body and left posterior elements, max SUV 7.8 (Deauville 5). Hypermetabolic lesion along the left inferior pubic ramus, max SUV 4.9 (Deauville 4).  IMPRESSION: Prevascular lymphadenopathy, reflecting nodal recurrence (Deauville  4). Hypermetabolic osseous metastases involving the L3 vertebral body/posterior elements and left inferior pubic ramus (Deauville 4-5). Hypermetabolism along the endometrium, new, possibly reactive/physiologic. Consider pelvic ultrasound and/or endometrial sampling as clinically warranted.      11/04/2016 - 02/24/2017 Chemotherapy    She received Brentuximab      12/11/2016 PET scan    1. Mixed response to therapy within the skeleton. Lesions at L3 is decreased in size and metabolic activity. Residual activity remains above liver ( Deauville 4) 2. Increased activity in the LEFT sacrum with metabolic activity above liver activity ( Deauville 4 3. Decrease in size and metabolic activity of anterior mediastinal tissue consistent with resolution of thymic hyperplasia or resolution of lymphoma metabolic activity ( Deauville 2). 4. No new lymphadenopathy. Normal spleen and liver.      03/25/2017 PET scan    1. Increased metabolic activity in several normal sized retroperitoneal and right pelvic lymph nodes, Deauville 4. 2. Increase in size and metabolic activity of lesions in the L3 and L4 vertebral bodies and in the left sacrum, Deauville 5. 3. No recurrence of anterior mediastinal abnormal activity. 4. Other imaging findings of potential clinical significance: Chronic ethmoid and left maxillary sinusitis. Paraseptal emphysema at the lung apices. Right ovarian dermoid.      04/06/2017 -  Chemotherapy    The patient had Keytruda      06/09/2017 PET scan    1. Overall significant improvement with reduction in nodal activity. Several retroperitoneal nodes which were previously Deauville 4 are currently Deauville 3. The right common iliac lymph node remains at Deauville 4 with maximum SUV of 3.4, but is improved from prior SUV of 4.3. 2. The bony lesions show the greatest improvement, with the previously Deauville 5 hypermetabolic lesions currently no longer of higher metabolic activity than  surrounding bone marrow, currently measuring at Deauville 3. 3. 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 4. No new lesions are identified. 5. Emphysema (ICD10-J43.9).       REVIEW OF SYSTEMS:   Constitutional: Denies fevers, chills or abnormal weight loss Eyes: Denies blurriness of vision Ears, nose, mouth, throat, and face: Denies mucositis or sore throat Respiratory: Denies cough, dyspnea or wheezes Cardiovascular: Denies palpitation, chest discomfort or lower extremity swelling Gastrointestinal:  Denies nausea, heartburn or change in bowel habits Skin: Denies abnormal skin rashes Lymphatics: Denies new lymphadenopathy or easy bruising Neurological:Denies numbness, tingling or new weaknesses Behavioral/Psych: Mood is stable, no new changes  All other systems were reviewed with the patient and are negative.  I have reviewed the past medical history, past surgical history, social history and family history with the patient and they are unchanged from previous note.  ALLERGIES:  is allergic to temazepam.  MEDICATIONS:  Current Outpatient Medications  Medication Sig Dispense Refill  . atazanavir (REYATAZ) 300 MG capsule Take 1 capsule (300 mg total) by mouth daily with  breakfast. Take with Norvir. 30 capsule 5  . cholecalciferol (VITAMIN D) 1000 units tablet Take 1 tablet (1,000 Units total) by mouth daily. 30 tablet 9  . dicyclomine (BENTYL) 20 MG tablet Take 1 tablet (20 mg total) by mouth 2 (two) times daily as needed for spasms. 20 tablet 0  . doxycycline (VIBRA-TABS) 100 MG tablet Take 1 tablet (100 mg total) by mouth 2 (two) times daily. 20 tablet 0  . dronabinol (MARINOL) 5 MG capsule Take 1 capsule (5 mg total) by mouth 2 (two) times daily before a meal. 60 capsule 5  . emtricitabine-tenofovir (TRUVADA) 200-300 MG tablet Take 1 tablet by mouth daily. 30 tablet 5  . hydrOXYzine (ATARAX/VISTARIL) 10 MG tablet Take 1 tablet (10 mg  total) by mouth 3 (three) times daily as needed. 90 tablet 1  . lidocaine-prilocaine (EMLA) cream Apply 1 application topically as needed. 30 g 11  . Polyvinyl Alcohol-Povidone (CLEAR EYES ALL SEASONS OP) Place 1 drop into both eyes daily.    . ritonavir (NORVIR) 100 MG TABS tablet Take 1 tablet by mouth every morning with breakfast. Take with Reyataz. 30 tablet 5  . traMADol (ULTRAM) 50 MG tablet Take 2 tablets (100 mg total) by mouth every 6 (six) hours as needed for moderate pain. 90 tablet 0  . traZODone (DESYREL) 50 MG tablet Take 1-2 tablets as needed at bedtime for sleep. 40 tablet 1  . triamcinolone cream (KENALOG) 0.1 % Apply 1 application topically 2 (two) times daily. (Patient taking differently: Apply 1 application topically 2 (two) times daily as needed (skin irritation). ) 30 g 0   No current facility-administered medications for this visit.     PHYSICAL EXAMINATION: ECOG PERFORMANCE STATUS: 1 - Symptomatic but completely ambulatory  Vitals:   06/10/17 0839  BP: 102/69  Pulse: 71  Resp: 18  Temp: 98.4 F (36.9 C)  SpO2: 100%   Filed Weights   06/10/17 0839  Weight: 121 lb 3.2 oz (55 kg)    GENERAL:alert, no distress and comfortable SKIN: skin color, texture, turgor are normal, no rashes or significant lesions EYES: normal, Conjunctiva are pink and non-injected, sclera clear OROPHARYNX:no exudate, no erythema and lips, buccal mucosa, and tongue normal.  Poor dentition is noted NECK: supple, thyroid normal size, non-tender, without nodularity LYMPH:  no palpable lymphadenopathy in the cervical, axillary or inguinal LUNGS: clear to auscultation and percussion with normal breathing effort HEART: regular rate & rhythm and no murmurs and no lower extremity edema ABDOMEN:abdomen soft, non-tender and normal bowel sounds Musculoskeletal:no cyanosis of digits and no clubbing  NEURO: alert & oriented x 3 with fluent speech, no focal motor/sensory deficits  LABORATORY DATA:   I have reviewed the data as listed    Component Value Date/Time   NA 138 05/20/2017 0758   NA 140 04/29/2017 0745   K 3.8 05/20/2017 0758   K 3.3 (L) 04/29/2017 0745   CL 106 05/20/2017 0758   CO2 22 05/20/2017 0758   CO2 23 04/29/2017 0745   GLUCOSE 85 05/20/2017 0758   GLUCOSE 83 04/29/2017 0745   BUN 11 05/20/2017 0758   BUN 10.7 04/29/2017 0745   CREATININE 0.92 05/20/2017 0758   CREATININE 1.2 (H) 04/29/2017 0745   CALCIUM 9.4 05/20/2017 0758   CALCIUM 9.8 04/29/2017 0745   PROT 7.2 05/20/2017 0758   PROT 7.6 04/29/2017 0745   ALBUMIN 3.9 05/20/2017 0758   ALBUMIN 4.0 04/29/2017 0745   AST 20 05/20/2017 0758   AST 19 04/29/2017  0745   ALT 20 05/20/2017 0758   ALT 19 04/29/2017 0745   ALKPHOS 80 05/20/2017 0758   ALKPHOS 81 04/29/2017 0745   BILITOT 0.7 05/20/2017 0758   BILITOT 1.31 (H) 04/29/2017 0745   GFRNONAA >60 05/20/2017 0758   GFRNONAA 83 02/20/2016 0908   GFRAA >60 05/20/2017 0758   GFRAA >89 02/20/2016 0908    No results found for: SPEP, UPEP  Lab Results  Component Value Date   WBC 4.9 06/10/2017   NEUTROABS 2.9 06/10/2017   HGB 13.6 06/10/2017   HCT 39.1 06/10/2017   MCV 102.1 (H) 06/10/2017   PLT 274 06/10/2017      Chemistry      Component Value Date/Time   NA 138 05/20/2017 0758   NA 140 04/29/2017 0745   K 3.8 05/20/2017 0758   K 3.3 (L) 04/29/2017 0745   CL 106 05/20/2017 0758   CO2 22 05/20/2017 0758   CO2 23 04/29/2017 0745   BUN 11 05/20/2017 0758   BUN 10.7 04/29/2017 0745   CREATININE 0.92 05/20/2017 0758   CREATININE 1.2 (H) 04/29/2017 0745      Component Value Date/Time   CALCIUM 9.4 05/20/2017 0758   CALCIUM 9.8 04/29/2017 0745   ALKPHOS 80 05/20/2017 0758   ALKPHOS 81 04/29/2017 0745   AST 20 05/20/2017 0758   AST 19 04/29/2017 0745   ALT 20 05/20/2017 0758   ALT 19 04/29/2017 0745   BILITOT 0.7 05/20/2017 0758   BILITOT 1.31 (H) 04/29/2017 0745       RADIOGRAPHIC STUDIES: I have reviewed imaging studies  with the patient I have personally reviewed the radiological images as listed and agreed with the findings in the report. Nm Pet Image Restag (ps) Skull Base To Thigh  Result Date: 06/09/2017 CLINICAL DATA:  Subsequent treatment strategy for nodular sclerosis Hodgkin's lymphoma. EXAM: NUCLEAR MEDICINE PET SKULL BASE TO THIGH TECHNIQUE: 6.1 mCi F-18 FDG was injected intravenously. Full-ring PET imaging was performed from the skull base to thigh after the radiotracer. CT data was obtained and used for attenuation correction and anatomic localization. FASTING BLOOD GLUCOSE:  Value: 86 mg/dl COMPARISON:  None. FINDINGS: NECK Palatine tonsillar activity 7.9 on the right and 6.6 on the left (formerly 5.1 and 5.8, respectively bilateral tongue base activity is somewhat accentuated but symmetric and likely physiologic. Similarly, there is symmetric glottic activity. No appreciable hypermetabolic adenopathy in the neck. CHEST Accentuated thymic activity, maximum SUV 3.8, formerly 2.1 the thymus is more prominent than on the prior exam but does not have a masslike contour. No hypermetabolic adenopathy in the chest. No hypermetabolic lung nodule on the CT data. Background mediastinal blood pool activity 1.5. Paraseptal emphysema.  Right Port-A-Cath tip: Right atrium. ABDOMEN/PELVIS The right pericaval reference lymph node shown on the prior exam measures 0.6 cm in short axis on image 111/4 (stable) and has a maximum SUV of 1.7 (formerly 4.6). The aortocaval lymph node measures 0.5 cm in short axis (previously 0.6 cm) and has a maximum SUV of 1.9 (formerly 3.0). A right common iliac lymph node measures 0.8 cm in short axis on image 130/4 (stable) with maximum SUV 3.4 (formerly 4.3 Other small lymph nodes in the retroperitoneum likewise demonstrate some low-grade activity. No significant abnormal accentuated metabolic activity in the liver, spleen, pancreas, or adrenal glands. Stable fatty mass in the right adnexa without  hypermetabolic activity compatible with dermoid. Background hepatic SUV: 2.5. SKELETON Overall significant improvement in the hypermetabolic skeletal lesions. The L3 vertebral body  has a maximum SUV of 2.1, previously 7.3. The previous right L4 pedicle and vertebral body lesion region has a maximum SUV of 2.5, formerly 6.9. The previous left sacral lesion area has a maximum SUV of 1.9, formerly 8.5. Currently the bony lesions are not focally different from the surrounding activity in normal marrow. No new bony lesions. IMPRESSION: 1. Overall significant improvement with reduction in nodal activity. Several retroperitoneal nodes which were previously Deauville 4 are currently Deauville 3. The right common iliac lymph node remains at Deauville 4 with maximum SUV of 3.4, but is improved from prior SUV of 4.3. 2. The bony lesions show the greatest improvement, with the previously Deauville 5 hypermetabolic lesions currently no longer of higher metabolic activity than surrounding bone marrow, currently measuring at Deauville 3. 3. 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 4. No new lesions are identified. 5.  Emphysema (ICD10-J43.9). Electronically Signed   By: Van Clines M.D.   On: 06/09/2017 09:24    All questions were answered. The patient knows to call the clinic with any problems, questions or concerns. No barriers to learning was detected.  I spent 25 minutes counseling the patient face to face. The total time spent in the appointment was 30 minutes and more than 50% was on counseling and review of test results  Heath Lark, MD 06/10/2017 9:08 AM

## 2017-06-10 NOTE — Assessment & Plan Note (Signed)
She has chronic wrist pain and diffuse musculoskeletal pain of unknown etiology but could be due to her cancer Examination is benign. I recommend vitamin D supplement. I refilled her prescription tramadol today.

## 2017-06-10 NOTE — Assessment & Plan Note (Signed)
Her last HIV viral load was mildly detected She will continue anti-retroviral treatment as directed

## 2017-06-21 ENCOUNTER — Other Ambulatory Visit: Payer: Self-pay | Admitting: *Deleted

## 2017-06-21 DIAGNOSIS — B20 Human immunodeficiency virus [HIV] disease: Secondary | ICD-10-CM

## 2017-06-21 MED ORDER — TRAMADOL HCL 50 MG PO TABS: 100.0000 mg | ORAL_TABLET | Freq: Four times a day (QID) | ORAL | 0 refills | Status: DC | PRN

## 2017-06-21 NOTE — Telephone Encounter (Signed)
Pt called for refill of tramadol. Done

## 2017-07-01 ENCOUNTER — Inpatient Hospital Stay (HOSPITAL_BASED_OUTPATIENT_CLINIC_OR_DEPARTMENT_OTHER): Payer: 59 | Admitting: Hematology and Oncology

## 2017-07-01 ENCOUNTER — Inpatient Hospital Stay: Payer: 59 | Attending: Hematology and Oncology

## 2017-07-01 ENCOUNTER — Inpatient Hospital Stay: Payer: 59

## 2017-07-01 ENCOUNTER — Encounter: Payer: Self-pay | Admitting: Hematology and Oncology

## 2017-07-01 VITALS — HR 74

## 2017-07-01 DIAGNOSIS — C8118 Nodular sclerosis classical Hodgkin lymphoma, lymph nodes of multiple sites: Secondary | ICD-10-CM

## 2017-07-01 DIAGNOSIS — M545 Low back pain: Secondary | ICD-10-CM | POA: Diagnosis not present

## 2017-07-01 DIAGNOSIS — Z79899 Other long term (current) drug therapy: Secondary | ICD-10-CM | POA: Insufficient documentation

## 2017-07-01 DIAGNOSIS — F17290 Nicotine dependence, other tobacco product, uncomplicated: Secondary | ICD-10-CM

## 2017-07-01 DIAGNOSIS — R109 Unspecified abdominal pain: Secondary | ICD-10-CM | POA: Diagnosis not present

## 2017-07-01 DIAGNOSIS — G8929 Other chronic pain: Secondary | ICD-10-CM | POA: Diagnosis not present

## 2017-07-01 DIAGNOSIS — N951 Menopausal and female climacteric states: Secondary | ICD-10-CM

## 2017-07-01 DIAGNOSIS — R1033 Periumbilical pain: Secondary | ICD-10-CM

## 2017-07-01 DIAGNOSIS — C8198 Hodgkin lymphoma, unspecified, lymph nodes of multiple sites: Secondary | ICD-10-CM

## 2017-07-01 DIAGNOSIS — R05 Cough: Secondary | ICD-10-CM | POA: Diagnosis not present

## 2017-07-01 DIAGNOSIS — Z5112 Encounter for antineoplastic immunotherapy: Secondary | ICD-10-CM | POA: Diagnosis present

## 2017-07-01 DIAGNOSIS — B20 Human immunodeficiency virus [HIV] disease: Secondary | ICD-10-CM | POA: Diagnosis not present

## 2017-07-01 DIAGNOSIS — Z5111 Encounter for antineoplastic chemotherapy: Secondary | ICD-10-CM | POA: Diagnosis present

## 2017-07-01 DIAGNOSIS — Z95828 Presence of other vascular implants and grafts: Secondary | ICD-10-CM

## 2017-07-01 LAB — CBC WITH DIFFERENTIAL/PLATELET
BASOS ABS: 0.1 10*3/uL (ref 0.0–0.1)
BASOS PCT: 3 %
EOS ABS: 0.2 10*3/uL (ref 0.0–0.5)
EOS PCT: 4 %
HCT: 41.4 % (ref 34.8–46.6)
HEMOGLOBIN: 14.2 g/dL (ref 11.6–15.9)
LYMPHS ABS: 1.6 10*3/uL (ref 0.9–3.3)
Lymphocytes Relative: 31 %
MCH: 35 pg — AB (ref 25.1–34.0)
MCHC: 34.4 g/dL (ref 31.5–36.0)
MCV: 101.8 fL — ABNORMAL HIGH (ref 79.5–101.0)
Monocytes Absolute: 0.3 10*3/uL (ref 0.1–0.9)
Monocytes Relative: 6 %
NEUTROS PCT: 56 %
Neutro Abs: 2.9 10*3/uL (ref 1.5–6.5)
PLATELETS: 244 10*3/uL (ref 145–400)
RBC: 4.07 MIL/uL (ref 3.70–5.45)
RDW: 13.8 % (ref 11.2–14.5)
WBC: 5.2 10*3/uL (ref 3.9–10.3)

## 2017-07-01 LAB — COMPREHENSIVE METABOLIC PANEL
ALBUMIN: 4.2 g/dL (ref 3.5–5.0)
ALT: 13 U/L (ref 0–55)
AST: 15 U/L (ref 5–34)
Alkaline Phosphatase: 85 U/L (ref 40–150)
Anion gap: 11 (ref 3–11)
BILIRUBIN TOTAL: 0.8 mg/dL (ref 0.2–1.2)
BUN: 10 mg/dL (ref 7–26)
CHLORIDE: 106 mmol/L (ref 98–109)
CO2: 22 mmol/L (ref 22–29)
Calcium: 10.1 mg/dL (ref 8.4–10.4)
Creatinine, Ser: 1.1 mg/dL (ref 0.60–1.10)
GFR calc Af Amer: >60 mL/min (ref >60)
GFR calc non Af Amer: >60 mL/min (ref >60)
GLUCOSE: 97 mg/dL (ref 70–140)
POTASSIUM: 3.5 mmol/L (ref 3.5–5.1)
SODIUM: 139 mmol/L (ref 136–145)
Total Protein: 7.7 g/dL (ref 6.4–8.3)

## 2017-07-01 LAB — TSH: TSH: 1.171 u[IU]/mL (ref 0.308–3.960)

## 2017-07-01 LAB — PREGNANCY, URINE: Preg Test, Ur: NEGATIVE

## 2017-07-01 MED ORDER — SODIUM CHLORIDE 0.9 % IV SOLN
Freq: Once | INTRAVENOUS | Status: AC
  Administered 2017-07-01: 11:00:00 via INTRAVENOUS

## 2017-07-01 MED ORDER — SODIUM CHLORIDE 0.9% FLUSH
10.0000 mL | INTRAVENOUS | Status: DC | PRN
  Administered 2017-07-01: 10 mL
  Filled 2017-07-01: qty 10

## 2017-07-01 MED ORDER — LEUPROLIDE ACETATE 7.5 MG IM KIT
7.5000 mg | PACK | Freq: Once | INTRAMUSCULAR | Status: AC
  Administered 2017-07-01: 7.5 mg via INTRAMUSCULAR
  Filled 2017-07-01: qty 7.5

## 2017-07-01 MED ORDER — TRAMADOL HCL 50 MG PO TABS: 100.0000 mg | ORAL_TABLET | Freq: Four times a day (QID) | ORAL | 0 refills | Status: DC | PRN

## 2017-07-01 MED ORDER — SODIUM CHLORIDE 0.9 % IV SOLN
200.0000 mg | Freq: Once | INTRAVENOUS | Status: AC
  Administered 2017-07-01: 200 mg via INTRAVENOUS
  Filled 2017-07-01: qty 8

## 2017-07-01 MED ORDER — HEPARIN SOD (PORK) LOCK FLUSH 100 UNIT/ML IV SOLN
500.0000 [IU] | Freq: Once | INTRAVENOUS | Status: AC | PRN
  Administered 2017-07-01: 500 [IU]
  Filled 2017-07-01: qty 5

## 2017-07-01 MED FILL — traMADol HCL 50 MG TABS: 50 | 12 days supply | Qty: 90 | Fill #0

## 2017-07-01 NOTE — Progress Notes (Signed)
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  Abdominal pain She has chronic wrist pain and diffuse musculoskeletal pain of unknown etiology but could be due to her cancer Examination is benign. I recommend vitamin D supplement. I refilled her prescription tramadol today.  Human immunodeficiency virus (HIV) disease ([place]) Her last HIV viral load was mildly detected She will continue anti-retroviral treatment as directed  Cigar smoker She has chronic smoking and cough I recommend her to quit cigar smoking   No orders of the defined types were placed in this encounter.   INTERVAL HISTORY: Please see below for problem oriented charting. She returns for further follow-up and chemotherapy She has intermittent hot flashes.  She has been amenorrheic She continues to have chronic intermittent back pain, well controlled with tramadol She denies recent infection She had mild chronic cough but continues to smoke cigar  SUMMARY OF ONCOLOGIC HISTORY:   Hodgkin lymphoma, nodular sclerosis ([place])   05/06/2014 Imaging    CT scan of the abdomen show diffuse mesenteric lymphadenopathy.      05/07/2014 Imaging    CT scan of the chest show right thoracic inlet lymphadenopathy      06/07/2014 Procedure    She underwent ultrasound-guided core biopsy of the neck lymph node      06/07/2014  Pathology Results    Accession: [id number] biopsy confirmed diagnosis of Hodgkin lymphoma.      06/15/2014 Imaging    Echocardiogram showed preserved ejection fraction      07/09/2014 - 07/12/2014 Hospital Admission    She was admitted to the hospital for severe anemia.      07/27/2014 Procedure    She had placement of port      07/31/2014 - 09/11/2014 Chemotherapy    She received dose adjusted chemotherapy due to abnormal liver function tests and severe anemia. Treatment was delayed due to noncompliance  and subsequently stopped because the patient failed to keep appointments      01/11/2015 Imaging    Repeat PET CT scan showed response to treatment      01/28/2015 - 06/18/2015 Chemotherapy    ABVD was restarted with full dose.      02/08/2015 - 02/10/2015 Hospital Admission    The patient was admitted to the hospital due to pancytopenia and profuse diarrhea. Cultures were negative. She was placed on ciprofloxacin.      02/11/2015 Adverse Reaction    Treatment was placed on hold due to recent infection.      04/11/2015 Imaging    PET CT scan showed near complete response. Incidental finding of an abnormal bone lesion, indeterminate. She is not symptomatic. Recommendation from [facility] to observe      07/11/2015 Imaging    PET CT scan showed abnormal new bone lesions, suggestive of possible disease progression      07/23/2015 Bone Marrow Biopsy    She underwent bone biopsy      07/23/2015 Pathology Results  Accession: [id number]  biopsy was negative for cancer      11/21/2015 Surgery    She had surgery for ectopic pregnancy      01/29/2016 Imaging    Ct chest, abdomen and pelvis showed pelvic and retroperitoneal lymphadenopathy, as above, concerning for residual disease. There is also a mildly enlarged posterior mediastinal lymph node measuring 11 mm adjacent to the distal descending thoracic aorta. This may represent an additional focus of disease, but is the only finding of  concern in the thorax on today's examination. Sclerosis in the right ilium at site of previously noted metabolically active lesion, grossly unchanged. No other definite osseous lesions are identified on today's examination. Spleen is normal in size and appearance.      02/14/2016 PET scan    Interval disease worsening with new foci of hypermetabolic activity in multiple retroperitoneal and pelvic lymph nodes as well as AP window and left hilar lymph nodes. (Deauville 5). There is also overall worsening of the osseous disease.      05/11/2016 Pathology Results    Diagnosis Lymph node, needle/core biopsy, Left para-aortic retroperitoneal - CLASSICAL HODGKIN LYMPHOMA. - SEE ONCOLOGY TABLE. Microscopic Comment LYMPHOMA Histologic type: Classical Hodgkin lymphoma. Grade (if applicable): N/A Flow cytometry: Not done. Immunohistochemical stains: CD15, CD20, CD3, LCA, PAX-5, CD30 with appropriate controls. Touch preps/imprints: Not performed. Comments: The sections show small needle core biopsy fragments displaying a polymorphous cellular proliferation of small lymphocytes, plasma cells, eosinophils, and large atypical mononuclear and multilobated lymphoid cells with features of Reed-Sternberg cells and variants. This is associated with patchy areas of fibrosis. Immunohistochemical stains were performed and show that the large atypical lymphoid cells are positive for CD30, CD15 and PAX-5 and negative for LCA, CD20, CD3. The small lymphoid cells in the background show a mixture of T and B cells with predominance of T cells. The overall morphologic and histologic features are consistent with classical Hodgkin lymphoma. Further subtyping is challenging in limited small biopsy fragments but the patchy fibrosis suggests nodular sclerosis type.       05/11/2016 Procedure    She underwent CT guided biopsy of retroperitoneal lymph node      05/26/2016 Procedure    Successful placement of a right IJ approach  Power Port with ultrasound and fluoroscopic guidance. The catheter is ready for use.      06/02/2016 PET scan    Mixed response to chemotherapy with some lymph nodes decreased in metabolic activity and some lymph nodes increase metabolic activity. Lymph node stations including mediastinum, periaortic retroperitoneum, and obturator node stations. Activity is remains relatively intense Deauville 4 2. LEFT infrahilar nodule / lymph node with intense metabolic activity decreased from prior. ( Deauville 4 ). 3. 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 Deauville 5      06/03/2016 - 06/05/2016 Hospital Admission    She was admitted to the hospital for cycle 1 of ICE chemotherapy      06/24/2016 - 06/26/2016 Hospital Admission    She received cycle 2 of ICE chemo      07/07/2016 PET scan    Resolution of prior hypermetabolic adenopathy and resolution of prior osseous foci of hypermetabolic activity compatible with essentially complete response to therapy (Deauville 1). 2. Generalized reduced activity in the L4 vertebral body and the type of finding which would typically reflect prior radiation therapy. 3. Stable septated fatty right pelvic lesion, possibly a dermoid or lipoma, not hypermetabolic      4/78/2956 PET scan  Hypermetabolic lesion along the L3 vertebral body and left posterior elements, max SUV 7.8 (Deauville 5). Hypermetabolic lesion along the left inferior pubic ramus, max SUV 4.9 (Deauville 4).  IMPRESSION: Prevascular lymphadenopathy, reflecting nodal recurrence (Deauville 4). Hypermetabolic osseous metastases involving the L3 vertebral body/posterior elements and left inferior pubic ramus (Deauville 4-5). Hypermetabolism along the endometrium, new, possibly reactive/physiologic. Consider pelvic ultrasound and/or endometrial sampling as clinically warranted.      11/04/2016 - 02/24/2017 Chemotherapy    She received Brentuximab      12/11/2016 PET scan     1. Mixed response to therapy within the skeleton. Lesions at L3 is decreased in size and metabolic activity. Residual activity remains above liver ( Deauville 4) 2. Increased activity in the LEFT sacrum with metabolic activity above liver activity ( Deauville 4 3. Decrease in size and metabolic activity of anterior mediastinal tissue consistent with resolution of thymic hyperplasia or resolution of lymphoma metabolic activity ( Deauville 2). 4. No new lymphadenopathy. Normal spleen and liver.      03/25/2017 PET scan    1. Increased metabolic activity in several normal sized retroperitoneal and right pelvic lymph nodes, Deauville 4. 2. Increase in size and metabolic activity of lesions in the L3 and L4 vertebral bodies and in the left sacrum, Deauville 5. 3. No recurrence of anterior mediastinal abnormal activity. 4. Other imaging findings of potential clinical significance: Chronic ethmoid and left maxillary sinusitis. Paraseptal emphysema at the lung apices. Right ovarian dermoid.      04/06/2017 -  Chemotherapy    The patient had Keytruda      06/09/2017 PET scan    1. Overall significant improvement with reduction in nodal activity. Several retroperitoneal nodes which were previously Deauville 4 are currently Deauville 3. The right common iliac lymph node remains at Deauville 4 with maximum SUV of 3.4, but is improved from prior SUV of 4.3. 2. The bony lesions show the greatest improvement, with the previously Deauville 5 hypermetabolic lesions currently no longer of higher metabolic activity than surrounding bone marrow, currently measuring at Deauville 3. 3. 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 4. No new lesions are identified. 5. Emphysema (ICD10-J43.9).       REVIEW OF SYSTEMS:   Constitutional: Denies fevers, chills or abnormal weight loss Eyes: Denies blurriness of vision Ears, nose, mouth, throat, and face: Denies mucositis or  sore throat Cardiovascular: Denies palpitation, chest discomfort or lower extremity swelling Gastrointestinal:  Denies nausea, heartburn or change in bowel habits Skin: Denies abnormal skin rashes Lymphatics: Denies new lymphadenopathy or easy bruising Neurological:Denies numbness, tingling or new weaknesses Behavioral/Psych: Mood is stable, no new changes  All other systems were reviewed with the patient and are negative.  I have reviewed the past medical history, past surgical history, social history and family history with the patient and they are unchanged from previous note.  ALLERGIES:  is allergic to temazepam.  MEDICATIONS:  Current Outpatient Medications  Medication Sig Dispense Refill  . atazanavir (REYATAZ) 300 MG capsule Take 1 capsule (300 mg total) by mouth daily with breakfast. Take with Norvir. 30 capsule 5  . cholecalciferol (VITAMIN D) 1000 units tablet Take 1 tablet (1,000 Units total) by mouth daily. 30 tablet 9  . dicyclomine (BENTYL) 20 MG tablet Take 1 tablet (20 mg total) by mouth 2 (two) times daily as needed for spasms. 20 tablet 0  . doxycycline (VIBRA-TABS) 100 MG tablet Take 1 tablet (100 mg total) by mouth 2 (two) times  daily. 20 tablet 0  . dronabinol (MARINOL) 5 MG capsule Take 1 capsule (5 mg total) by mouth 2 (two) times daily before a meal. 60 capsule 5  . emtricitabine-tenofovir (TRUVADA) 200-300 MG tablet Take 1 tablet by mouth daily. 30 tablet 5  . hydrOXYzine (ATARAX/VISTARIL) 10 MG tablet Take 1 tablet (10 mg total) by mouth 3 (three) times daily as needed. 90 tablet 1  . lidocaine-prilocaine (EMLA) cream Apply 1 application topically as needed. 30 g 11  . Polyvinyl Alcohol-Povidone (CLEAR EYES ALL SEASONS OP) Place 1 drop into both eyes daily.    . ritonavir (NORVIR) 100 MG TABS tablet Take 1 tablet by mouth every morning with breakfast. Take with Reyataz. 30 tablet 5  . traMADol (ULTRAM) 50 MG tablet Take 2 tablets (100 mg total) by mouth every 6  (six) hours as needed for moderate pain. 90 tablet 0  . traZODone (DESYREL) 50 MG tablet Take 1-2 tablets as needed at bedtime for sleep. 40 tablet 1  . triamcinolone cream (KENALOG) 0.1 % Apply 1 application topically 2 (two) times daily. (Patient taking differently: Apply 1 application topically 2 (two) times daily as needed (skin irritation). ) 30 g 0   No current facility-administered medications for this visit.    Facility-Administered Medications Ordered in Other Visits  Medication Dose Route Frequency Provider Last Rate Last Dose  . sodium chloride flush (NS) 0.9 % injection 10 mL  10 mL Intracatheter PRN Heath Lark, MD   10 mL at 07/01/17 1223    PHYSICAL EXAMINATION: ECOG PERFORMANCE STATUS: 1 - Symptomatic but completely ambulatory  Vitals:   07/01/17 1000  BP: 110/76  Pulse: 68  Resp: 18  Temp: 97.8 F (36.6 C)   Filed Weights   07/01/17 1000  Weight: 118 lb 6.4 oz (53.7 kg)    GENERAL:alert, no distress and comfortable SKIN: skin color, texture, turgor are normal, no rashes or significant lesions EYES: normal, Conjunctiva are pink and non-injected, sclera clear OROPHARYNX:no exudate, no erythema and lips, buccal mucosa, and tongue normal  NECK: supple, thyroid normal size, non-tender, without nodularity LYMPH:  no palpable lymphadenopathy in the cervical, axillary or inguinal LUNGS: clear to auscultation and percussion with normal breathing effort HEART: regular rate & rhythm and no murmurs and no lower extremity edema ABDOMEN:abdomen soft, non-tender and normal bowel sounds Musculoskeletal:no cyanosis of digits and no clubbing  NEURO: alert & oriented x 3 with fluent speech, no focal motor/sensory deficits  LABORATORY DATA:  I have reviewed the data as listed    Component Value Date/Time   NA 139 07/01/2017 0926   NA 140 04/29/2017 0745   K 3.5 07/01/2017 0926   K 3.3 (L) 04/29/2017 0745   CL 106 07/01/2017 0926   CO2 22 07/01/2017 0926   CO2 23  04/29/2017 0745   GLUCOSE 97 07/01/2017 0926   GLUCOSE 83 04/29/2017 0745   BUN 10 07/01/2017 0926   BUN 10.7 04/29/2017 0745   CREATININE 1.10 07/01/2017 0926   CREATININE 1.2 (H) 04/29/2017 0745   CALCIUM 10.1 07/01/2017 0926   CALCIUM 9.8 04/29/2017 0745   PROT 7.7 07/01/2017 0926   PROT 7.6 04/29/2017 0745   ALBUMIN 4.2 07/01/2017 0926   ALBUMIN 4.0 04/29/2017 0745   AST 15 07/01/2017 0926   AST 19 04/29/2017 0745   ALT 13 07/01/2017 0926   ALT 19 04/29/2017 0745   ALKPHOS 85 07/01/2017 0926   ALKPHOS 81 04/29/2017 0745   BILITOT 0.8 07/01/2017 0926   BILITOT  1.31 (H) 04/29/2017 0745   GFRNONAA >60 07/01/2017 0926   GFRNONAA 83 02/20/2016 0908   GFRAA >60 07/01/2017 0926   GFRAA >89 02/20/2016 0908    No results found for: SPEP, UPEP  Lab Results  Component Value Date   WBC 5.2 07/01/2017   NEUTROABS 2.9 07/01/2017   HGB 14.2 07/01/2017   HCT 41.4 07/01/2017   MCV 101.8 (H) 07/01/2017   PLT 244 07/01/2017      Chemistry      Component Value Date/Time   NA 139 07/01/2017 0926   NA 140 04/29/2017 0745   K 3.5 07/01/2017 0926   K 3.3 (L) 04/29/2017 0745   CL 106 07/01/2017 0926   CO2 22 07/01/2017 0926   CO2 23 04/29/2017 0745   BUN 10 07/01/2017 0926   BUN 10.7 04/29/2017 0745   CREATININE 1.10 07/01/2017 0926   CREATININE 1.2 (H) 04/29/2017 0745      Component Value Date/Time   CALCIUM 10.1 07/01/2017 0926   CALCIUM 9.8 04/29/2017 0745   ALKPHOS 85 07/01/2017 0926   ALKPHOS 81 04/29/2017 0745   AST 15 07/01/2017 0926   AST 19 04/29/2017 0745   ALT 13 07/01/2017 0926   ALT 19 04/29/2017 0745   BILITOT 0.8 07/01/2017 0926   BILITOT 1.31 (H) 04/29/2017 0745       RADIOGRAPHIC STUDIES: I have personally reviewed the radiological images as listed and agreed with the findings in the report. Nm Pet Image Restag (ps) Skull Base To Thigh  Result Date: 06/09/2017 CLINICAL DATA:  Subsequent treatment strategy for nodular sclerosis Hodgkin's lymphoma.  EXAM: NUCLEAR MEDICINE PET SKULL BASE TO THIGH TECHNIQUE: 6.1 mCi F-18 FDG was injected intravenously. Full-ring PET imaging was performed from the skull base to thigh after the radiotracer. CT data was obtained and used for attenuation correction and anatomic localization. FASTING BLOOD GLUCOSE:  Value: 86 mg/dl COMPARISON:  None. FINDINGS: NECK Palatine tonsillar activity 7.9 on the right and 6.6 on the left (formerly 5.1 and 5.8, respectively bilateral tongue base activity is somewhat accentuated but symmetric and likely physiologic. Similarly, there is symmetric glottic activity. No appreciable hypermetabolic adenopathy in the neck. CHEST Accentuated thymic activity, maximum SUV 3.8, formerly 2.1 the thymus is more prominent than on the prior exam but does not have a masslike contour. No hypermetabolic adenopathy in the chest. No hypermetabolic lung nodule on the CT data. Background mediastinal blood pool activity 1.5. Paraseptal emphysema.  Right Port-A-Cath tip: Right atrium. ABDOMEN/PELVIS The right pericaval reference lymph node shown on the prior exam measures 0.6 cm in short axis on image 111/4 (stable) and has a maximum SUV of 1.7 (formerly 4.6). The aortocaval lymph node measures 0.5 cm in short axis (previously 0.6 cm) and has a maximum SUV of 1.9 (formerly 3.0). A right common iliac lymph node measures 0.8 cm in short axis on image 130/4 (stable) with maximum SUV 3.4 (formerly 4.3 Other small lymph nodes in the retroperitoneum likewise demonstrate some low-grade activity. No significant abnormal accentuated metabolic activity in the liver, spleen, pancreas, or adrenal glands. Stable fatty mass in the right adnexa without hypermetabolic activity compatible with dermoid. Background hepatic SUV: 2.5. SKELETON Overall significant improvement in the hypermetabolic skeletal lesions. The L3 vertebral body has a maximum SUV of 2.1, previously 7.3. The previous right L4 pedicle and vertebral body lesion region  has a maximum SUV of 2.5, formerly 6.9. The previous left sacral lesion area has a maximum SUV of 1.9, formerly 8.5. Currently the  bony lesions are not focally different from the surrounding activity in normal marrow. No new bony lesions. IMPRESSION: 1. Overall significant improvement with reduction in nodal activity. Several retroperitoneal nodes which were previously Deauville 4 are currently Deauville 3. The right common iliac lymph node remains at Deauville 4 with maximum SUV of 3.4, but is improved from prior SUV of 4.3. 2. The bony lesions show the greatest improvement, with the previously Deauville 5 hypermetabolic lesions currently no longer of higher metabolic activity than surrounding bone marrow, currently measuring at Deauville 3. 3. 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 4. No new lesions are identified. 5.  Emphysema (ICD10-J43.9). Electronically Signed   By: Van Clines M.D.   On: 06/09/2017 09:24    All questions were answered. The patient knows to call the clinic with any problems, questions or concerns. No barriers to learning was detected.  I spent 15 minutes counseling the patient face to face. The total time spent in the appointment was 20 minutes and more than 50% was on counseling and review of test results  Heath Lark, MD 07/01/2017 12:46 PM

## 2017-07-01 NOTE — Patient Instructions (Addendum)
[facility] Discharge Instructions for Patients Receiving Chemotherapy  Today you received the following chemotherapy agents Keytruda  To help prevent nausea and vomiting after your treatment, we encourage you to take your nausea medication as directed.    If you develop nausea and vomiting that is not controlled by your nausea medication, call the clinic.   BELOW ARE SYMPTOMS THAT SHOULD BE REPORTED IMMEDIATELY:  *FEVER GREATER THAN 100.5 F  *CHILLS WITH OR WITHOUT FEVER  NAUSEA AND VOMITING THAT IS NOT CONTROLLED WITH YOUR NAUSEA MEDICATION  *UNUSUAL SHORTNESS OF BREATH  *UNUSUAL BRUISING OR BLEEDING  TENDERNESS IN MOUTH AND THROAT WITH OR WITHOUT PRESENCE OF ULCERS  *URINARY PROBLEMS  *BOWEL PROBLEMS  UNUSUAL RASH Items with * indicate a potential emergency and should be followed up as soon as possible.  Feel free to call the clinic should you have any questions or concerns. The clinic phone number is [phone].  Please show the [facility] at check-in to the Emergency Department and triage nurse.  Leuprolide injection What is this medicine? LEUPROLIDE (loo PROE lide) is a man-made hormone. It is used to treat the symptoms of prostate cancer. This medicine may also be used to treat children with early onset of puberty. It may be used for other hormonal conditions. This medicine may be used for other purposes; ask your health care provider or pharmacist if you have questions. COMMON BRAND NAME(S): Lupron What should I tell my health care provider before I take this medicine? They need to know if you have any of these conditions: -diabetes -heart disease or previous heart attack -high blood pressure -high cholesterol -pain or difficulty passing urine -spinal cord metastasis -stroke -tobacco smoker -an unusual or allergic reaction to leuprolide, benzyl alcohol, other medicines, foods, dyes, or preservatives -pregnant or trying to get  pregnant -breast-feeding How should I use this medicine? This medicine is for injection under the skin or into a muscle. You will be taught how to prepare and give this medicine. Use exactly as directed. Take your medicine at regular intervals. Do not take your medicine more often than directed. It is important that you put your used needles and syringes in a special sharps container. Do not put them in a trash can. If you do not have a sharps container, call your pharmacist or healthcare provider to get one. A special MedGuide will be given to you by the pharmacist with each prescription and refill. Be sure to read this information carefully each time. Talk to your pediatrician regarding the use of this medicine in children. While this medicine may be prescribed for children as Brina Umeda as 8 years for selected conditions, precautions do apply. Overdosage: If you think you have taken too much of this medicine contact a poison control center or emergency room at once. NOTE: This medicine is only for you. Do not share this medicine with others. What if I miss a dose? If you miss a dose, take it as soon as you can. If it is almost time for your next dose, take only that dose. Do not take double or extra doses. What may interact with this medicine? Do not take this medicine with any of the following medications: -chasteberry This medicine may also interact with the following medications: -herbal or dietary supplements, like black cohosh or DHEA -female hormones, like estrogens or progestins and birth control pills, patches, rings, or injections -female hormones, like testosterone This list may not describe all possible interactions. Give your  health care provider a list of all the medicines, herbs, non-prescription drugs, or dietary supplements you use. Also tell them if you smoke, drink alcohol, or use illegal drugs. Some items may interact with your medicine. What should I watch for while using this  medicine? Visit your doctor or health care professional for regular checks on your progress. During the first week, your symptoms may get worse, but then will improve as you continue your treatment. You may get hot flashes, increased bone pain, increased difficulty passing urine, or an aggravation of nerve symptoms. Discuss these effects with your doctor or health care professional, some of them may improve with continued use of this medicine. Female patients may experience a menstrual cycle or spotting during the first 2 months of therapy with this medicine. If this continues, contact your doctor or health care professional. What side effects may I notice from receiving this medicine? Side effects that you should report to your doctor or health care professional as soon as possible: -allergic reactions like skin rash, itching or hives, swelling of the face, lips, or tongue -breathing problems -chest pain -depression or memory disorders -pain in your legs or groin -pain at site where injected -severe headache -swelling of the feet and legs -visual changes -vomiting Side effects that usually do not require medical attention (report to your doctor or health care professional if they continue or are bothersome): -breast swelling or tenderness -decrease in sex drive or performance -diarrhea -hot flashes -loss of appetite -muscle, joint, or bone pains -nausea -redness or irritation at site where injected -skin problems or acne This list may not describe all possible side effects. Call your doctor for medical advice about side effects. You may report side effects to FDA at 1-800-FDA-1088. Where should I keep my medicine? Keep out of the reach of children. Store below 25 degrees C (77 degrees F). Do not freeze. Protect from light. Do not use if it is not clear or if there are particles present. Throw away any unused medicine after the expiration date. NOTE: This sheet is a summary. It may not  cover all possible information. If you have questions about this medicine, talk to your doctor, pharmacist, or health care provider.  2018 Elsevier/Gold Standard (2015-12-02 10:54:35)

## 2017-07-01 NOTE — Assessment & Plan Note (Signed)
She has chronic wrist pain and diffuse musculoskeletal pain of unknown etiology but could be due to her cancer Examination is benign. I recommend vitamin D supplement. I refilled her prescription tramadol today.

## 2017-07-01 NOTE — Assessment & Plan Note (Signed)
Her last HIV viral load was mildly detected She will continue anti-retroviral treatment as directed

## 2017-07-01 NOTE — Patient Instructions (Signed)
Implanted Port Home Guide An implanted port is a type of central line that is placed under the skin. Central lines are used to provide IV access when treatment or nutrition needs to be given through a person's veins. Implanted ports are used for long-term IV access. An implanted port may be placed because:  You need IV medicine that would be irritating to the small veins in your hands or arms.  You need long-term IV medicines, such as antibiotics.  You need IV nutrition for a long period.  You need frequent blood draws for lab tests.  You need dialysis.  Implanted ports are usually placed in the chest area, but they can also be placed in the upper arm, the abdomen, or the leg. An implanted port has two main parts:  Reservoir. The reservoir is round and will appear as a small, raised area under your skin. The reservoir is the part where a needle is inserted to give medicines or draw blood.  Catheter. The catheter is a thin, flexible tube that extends from the reservoir. The catheter is placed into a large vein. Medicine that is inserted into the reservoir goes into the catheter and then into the vein.  How will I care for my incision site? Do not get the incision site wet. Bathe or shower as directed by your health care provider. How is my port accessed? Special steps must be taken to access the port:  Before the port is accessed, a numbing cream can be placed on the skin. This helps numb the skin over the port site.  Your health care provider uses a sterile technique to access the port. ? Your health care provider must put on a mask and sterile gloves. ? The skin over your port is cleaned carefully with an antiseptic and allowed to dry. ? The port is gently pinched between sterile gloves, and a needle is inserted into the port.  Only "non-coring" port needles should be used to access the port. Once the port is accessed, a blood return should be checked. This helps ensure that the port  is in the vein and is not clogged.  If your port needs to remain accessed for a constant infusion, a clear (transparent) bandage will be placed over the needle site. The bandage and needle will need to be changed every week, or as directed by your health care provider.  Keep the bandage covering the needle clean and dry. Do not get it wet. Follow your health care provider's instructions on how to take a shower or bath while the port is accessed.  If your port does not need to stay accessed, no bandage is needed over the port.  What is flushing? Flushing helps keep the port from getting clogged. Follow your health care provider's instructions on how and when to flush the port. Ports are usually flushed with saline solution or a medicine called heparin. The need for flushing will depend on how the port is used.  If the port is used for intermittent medicines or blood draws, the port will need to be flushed: ? After medicines have been given. ? After blood has been drawn. ? As part of routine maintenance.  If a constant infusion is running, the port may not need to be flushed.  How long will my port stay implanted? The port can stay in for as long as your health care provider thinks it is needed. When it is time for the port to come out, surgery will be   done to remove it. The procedure is similar to the one performed when the port was put in. When should I seek immediate medical care? When you have an implanted port, you should seek immediate medical care if:  You notice a bad smell coming from the incision site.  You have swelling, redness, or drainage at the incision site.  You have more swelling or pain at the port site or the surrounding area.  You have a fever that is not controlled with medicine.  This information is not intended to replace advice given to you by your health care provider. Make sure you discuss any questions you have with your health care provider. Document  Released: 04/13/2005 Document Revised: 09/19/2015 Document Reviewed: 12/19/2012 Elsevier Interactive Patient Education  2017 [employer]  

## 2017-07-01 NOTE — Assessment & Plan Note (Signed)
She has chronic smoking and cough I recommend her to quit cigar smoking

## 2017-07-01 NOTE — Assessment & Plan Note (Signed)
The patient is delighted to see positive response to treatment We will continue treatment indefinitely I plan to scan her every 3 months, next imaging study would be around May 2019.

## 2017-07-12 ENCOUNTER — Other Ambulatory Visit: Payer: Self-pay | Admitting: Hematology and Oncology

## 2017-07-12 ENCOUNTER — Telehealth: Payer: Self-pay

## 2017-07-12 DIAGNOSIS — B20 Human immunodeficiency virus [HIV] disease: Secondary | ICD-10-CM

## 2017-07-12 MED ORDER — TRAMADOL HCL 50 MG PO TABS
100.0000 mg | ORAL_TABLET | Freq: Four times a day (QID) | ORAL | 0 refills | Status: DC | PRN
Start: 2017-07-12 — End: 2017-07-22

## 2017-07-12 MED FILL — traMADol HCL 50 MG TABS: 50 | 12 days supply | Qty: 90 | Fill #0

## 2017-07-12 NOTE — Telephone Encounter (Signed)
Called and told her Rx ready for pick up. Verbalized understanding.

## 2017-07-12 NOTE — Telephone Encounter (Signed)
Called and left message requesting refill on Tramadol Rx.

## 2017-07-22 ENCOUNTER — Inpatient Hospital Stay: Payer: 59

## 2017-07-22 ENCOUNTER — Inpatient Hospital Stay (HOSPITAL_BASED_OUTPATIENT_CLINIC_OR_DEPARTMENT_OTHER): Payer: 59 | Admitting: Hematology and Oncology

## 2017-07-22 ENCOUNTER — Encounter: Payer: Self-pay | Admitting: Hematology and Oncology

## 2017-07-22 ENCOUNTER — Other Ambulatory Visit: Payer: Self-pay

## 2017-07-22 DIAGNOSIS — G8929 Other chronic pain: Secondary | ICD-10-CM

## 2017-07-22 DIAGNOSIS — C8118 Nodular sclerosis classical Hodgkin lymphoma, lymph nodes of multiple sites: Secondary | ICD-10-CM

## 2017-07-22 DIAGNOSIS — R001 Bradycardia, unspecified: Secondary | ICD-10-CM

## 2017-07-22 DIAGNOSIS — C8198 Hodgkin lymphoma, unspecified, lymph nodes of multiple sites: Secondary | ICD-10-CM

## 2017-07-22 DIAGNOSIS — Z720 Tobacco use: Secondary | ICD-10-CM

## 2017-07-22 DIAGNOSIS — B20 Human immunodeficiency virus [HIV] disease: Secondary | ICD-10-CM | POA: Diagnosis not present

## 2017-07-22 DIAGNOSIS — F17290 Nicotine dependence, other tobacco product, uncomplicated: Secondary | ICD-10-CM

## 2017-07-22 DIAGNOSIS — M25531 Pain in right wrist: Secondary | ICD-10-CM | POA: Diagnosis not present

## 2017-07-22 DIAGNOSIS — Z5112 Encounter for antineoplastic immunotherapy: Secondary | ICD-10-CM | POA: Diagnosis not present

## 2017-07-22 DIAGNOSIS — Z5111 Encounter for antineoplastic chemotherapy: Secondary | ICD-10-CM

## 2017-07-22 LAB — COMPREHENSIVE METABOLIC PANEL
ALK PHOS: 53 U/L (ref 40–150)
ALT: 24 U/L (ref 0–55)
AST: 14 U/L (ref 5–34)
Albumin: 3.6 g/dL (ref 3.5–5.0)
Anion gap: 8 (ref 3–11)
BUN: 13 mg/dL (ref 7–26)
CALCIUM: 9.5 mg/dL (ref 8.4–10.4)
CO2: 25 mmol/L (ref 22–29)
CREATININE: 0.82 mg/dL (ref 0.60–1.10)
Chloride: 106 mmol/L (ref 98–109)
GFR calc non Af Amer: >60 mL/min (ref >60)
Glucose, Bld: 86 mg/dL (ref 70–140)
Potassium: 3.9 mmol/L (ref 3.5–5.1)
Sodium: 139 mmol/L (ref 136–145)
Total Bilirubin: 0.6 mg/dL (ref 0.2–1.2)
Total Protein: 6.4 g/dL (ref 6.4–8.3)

## 2017-07-22 LAB — CBC WITH DIFFERENTIAL/PLATELET
Basophils Absolute: 0 10*3/uL (ref 0.0–0.1)
Basophils Relative: 0 %
Eosinophils Absolute: 0 10*3/uL (ref 0.0–0.5)
Eosinophils Relative: 0 %
HEMATOCRIT: 43.6 % (ref 34.8–46.6)
HEMOGLOBIN: 14.9 g/dL (ref 11.6–15.9)
LYMPHS ABS: 2 10*3/uL (ref 0.9–3.3)
LYMPHS PCT: 16 %
MCH: 35.7 pg — AB (ref 25.1–34.0)
MCHC: 34.2 g/dL (ref 31.5–36.0)
MCV: 104.3 fL — ABNORMAL HIGH (ref 79.5–101.0)
Monocytes Absolute: 0.9 10*3/uL (ref 0.1–0.9)
Monocytes Relative: 8 %
NEUTROS ABS: 9.2 10*3/uL — AB (ref 1.5–6.5)
Neutrophils Relative %: 76 %
Platelets: 231 10*3/uL (ref 145–400)
RBC: 4.18 MIL/uL (ref 3.70–5.45)
RDW: 15.6 % — ABNORMAL HIGH (ref 11.2–14.5)
WBC: 12.2 10*3/uL — AB (ref 3.9–10.3)

## 2017-07-22 LAB — TSH: TSH: 1.551 u[IU]/mL (ref 0.308–3.960)

## 2017-07-22 LAB — PREGNANCY, URINE: PREG TEST UR: NEGATIVE

## 2017-07-22 MED ORDER — SODIUM CHLORIDE 0.9 % IV SOLN
200.0000 mg | Freq: Once | INTRAVENOUS | Status: AC
  Administered 2017-07-22: 200 mg via INTRAVENOUS
  Filled 2017-07-22: qty 8

## 2017-07-22 MED ORDER — HEPARIN SOD (PORK) LOCK FLUSH 100 UNIT/ML IV SOLN
500.0000 [IU] | Freq: Once | INTRAVENOUS | Status: AC | PRN
  Administered 2017-07-22: 500 [IU]
  Filled 2017-07-22: qty 5

## 2017-07-22 MED ORDER — SODIUM CHLORIDE 0.9 % IV SOLN
Freq: Once | INTRAVENOUS | Status: AC
  Administered 2017-07-22: 10:00:00 via INTRAVENOUS

## 2017-07-22 MED ORDER — TRAMADOL HCL 50 MG PO TABS: 100.0000 mg | ORAL_TABLET | Freq: Four times a day (QID) | ORAL | 0 refills | Status: DC | PRN

## 2017-07-22 MED ORDER — SODIUM CHLORIDE 0.9% FLUSH
10.0000 mL | INTRAVENOUS | Status: DC | PRN
  Administered 2017-07-22: 10 mL
  Filled 2017-07-22: qty 10

## 2017-07-22 NOTE — Assessment & Plan Note (Signed)
Her last HIV viral load was mildly detected She will continue anti-retroviral treatment as directed

## 2017-07-22 NOTE — Patient Instructions (Signed)
[facility] Discharge Instructions for Patients Receiving Chemotherapy  Today you received the following chemotherapy agents:  Keytruda.  To help prevent nausea and vomiting after your treatment, we encourage you to take your nausea medication as directed.   If you develop nausea and vomiting that is not controlled by your nausea medication, call the clinic.   BELOW ARE SYMPTOMS THAT SHOULD BE REPORTED IMMEDIATELY:  *FEVER GREATER THAN 100.5 F  *CHILLS WITH OR WITHOUT FEVER  NAUSEA AND VOMITING THAT IS NOT CONTROLLED WITH YOUR NAUSEA MEDICATION  *UNUSUAL SHORTNESS OF BREATH  *UNUSUAL BRUISING OR BLEEDING  TENDERNESS IN MOUTH AND THROAT WITH OR WITHOUT PRESENCE OF ULCERS  *URINARY PROBLEMS  *BOWEL PROBLEMS  UNUSUAL RASH Items with * indicate a potential emergency and should be followed up as soon as possible.  Feel free to call the clinic should you have any questions or concerns. The clinic phone number is [phone].  Please show the CHEMO ALERT CARD at check-in to the Emergency Department and triage nurse.    

## 2017-07-22 NOTE — Assessment & Plan Note (Signed)
The patient is a cigar smoker and has recently used illicit substance I warned her that the illicit substance could have contributed to her bradycardia and we discussed extensively the importance of not smoking or use illicit substances

## 2017-07-22 NOTE — Assessment & Plan Note (Signed)
She has chronic wrist pain and diffuse musculoskeletal pain of unknown etiology but could be due to her cancer Examination is benign. I recommend vitamin D supplement. I refilled her prescription tramadol today.

## 2017-07-22 NOTE — Progress Notes (Signed)
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  Bradycardia on ECG Patient is noted to have bradycardia She is not symptomatic I recommend discontinuation of trazodone We will proceed with treatment today without delay  Chronic pain of right wrist She has chronic wrist pain and diffuse musculoskeletal pain of unknown etiology but could be due to her cancer Examination is benign. I recommend vitamin D supplement. I refilled her prescription tramadol today.  Cigar smoker The patient is a cigar smoker and has recently used illicit substance I warned her that the illicit substance could have contributed to her bradycardia and we discussed extensively the importance of not smoking or use illicit substances  Human immunodeficiency virus (HIV) disease ([place]) Her last HIV viral load was mildly detected She will continue anti-retroviral treatment as directed   Orders Placed This Encounter  Procedures  . EKG 12-Lead    Standing Status:   Standing    Number of Occurrences:   1    Order Specific Question:   Reason for Exam    Answer:   irregular heart beat    INTERVAL HISTORY: Please see below for problem oriented charting. She returns for treatment The patient complained of some mild discomfort recently but could not pinpoint her problem She continues to smoke cigar On further questioning,  she has smoked some illicit substances recently No new lymphadenopathy Denies recent infection, fever or chills Denies syncopal episode Chronic joint pain is stable with tramadol  SUMMARY OF ONCOLOGIC HISTORY:   Hodgkin lymphoma, nodular sclerosis ([place])   05/06/2014 Imaging    CT scan of the abdomen show diffuse mesenteric lymphadenopathy.      05/07/2014 Imaging    CT scan of the chest show right thoracic inlet lymphadenopathy      06/07/2014 Procedure    She underwent ultrasound-guided core biopsy of the neck lymph node      06/07/2014 Pathology Results    Accession: [id number] biopsy confirmed diagnosis of Hodgkin lymphoma.      06/15/2014 Imaging    Echocardiogram showed preserved ejection fraction      07/09/2014 - 07/12/2014 Hospital Admission    She was admitted to the hospital for severe anemia.      07/27/2014 Procedure    She had placement of port      07/31/2014 - 09/11/2014 Chemotherapy    She received dose adjusted chemotherapy due to abnormal liver function tests and severe anemia. Treatment was delayed due to noncompliance  and subsequently stopped because the patient failed to keep appointments      01/11/2015 Imaging    Repeat PET CT scan showed response to treatment      01/28/2015 - 06/18/2015 Chemotherapy    ABVD was restarted with full dose.      02/08/2015 - 02/10/2015 Hospital Admission    The patient was admitted to the hospital due to pancytopenia and profuse diarrhea. Cultures were negative. She was placed on ciprofloxacin.      02/11/2015  Adverse Reaction    Treatment was placed on hold due to recent infection.      04/11/2015 Imaging    PET CT scan showed near complete response. Incidental finding of an abnormal bone lesion, indeterminate. She is not symptomatic. Recommendation from [facility] to observe      07/11/2015 Imaging    PET CT scan showed abnormal new bone lesions, suggestive of possible disease progression      07/23/2015 Bone Marrow  Biopsy    She underwent bone biopsy      07/23/2015 Pathology Results    Accession: [id number]  biopsy was negative for cancer      11/21/2015 Surgery    She had surgery for ectopic pregnancy      01/29/2016 Imaging    Ct chest, abdomen and pelvis showed pelvic and retroperitoneal lymphadenopathy, as above, concerning for residual disease. There is also a mildly enlarged posterior mediastinal lymph node measuring 11 mm adjacent to the distal descending thoracic aorta. This may represent an additional focus of disease, but is the only finding of concern in the thorax on today's examination. Sclerosis in the right ilium at site of previously noted metabolically active lesion, grossly unchanged. No other definite osseous lesions are identified on today's examination. Spleen is normal in size and appearance.      02/14/2016 PET scan    Interval disease worsening with new foci of hypermetabolic activity in multiple retroperitoneal and pelvic lymph nodes as well as AP window and left hilar lymph nodes. (Deauville 5). There is also overall worsening of the osseous disease.      05/11/2016 Pathology Results    Diagnosis Lymph node, needle/core biopsy, Left para-aortic retroperitoneal - CLASSICAL HODGKIN LYMPHOMA. - SEE ONCOLOGY TABLE. Microscopic Comment LYMPHOMA Histologic type: Classical Hodgkin lymphoma. Grade (if applicable): N/A Flow cytometry: Not done. Immunohistochemical stains: CD15, CD20, CD3, LCA, PAX-5, CD30 with appropriate controls. Touch preps/imprints: Not performed. Comments: The sections show small needle core biopsy fragments displaying a polymorphous cellular proliferation of small lymphocytes, plasma cells, eosinophils, and large atypical mononuclear and multilobated lymphoid cells with features of Reed-Sternberg cells and variants. This is associated with patchy areas of fibrosis. Immunohistochemical stains were performed and show that the large atypical lymphoid cells are  positive for CD30, CD15 and PAX-5 and negative for LCA, CD20, CD3. The small lymphoid cells in the background show a mixture of T and B cells with predominance of T cells. The overall morphologic and histologic features are consistent with classical Hodgkin lymphoma. Further subtyping is challenging in limited small biopsy fragments but the patchy fibrosis suggests nodular sclerosis type.       05/11/2016 Procedure    She underwent CT guided biopsy of retroperitoneal lymph node      05/26/2016 Procedure    Successful placement of a right IJ approach Power Port with ultrasound and fluoroscopic guidance. The catheter is ready for use.      06/02/2016 PET scan    Mixed response to chemotherapy with some lymph nodes decreased in metabolic activity and some lymph nodes increase metabolic activity. Lymph node stations including mediastinum, periaortic retroperitoneum, and obturator node stations. Activity is remains relatively intense Deauville 4 2. LEFT infrahilar nodule / lymph node with intense metabolic activity decreased from prior. ( Deauville 4 ). 3. 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 Deauville 5      06/03/2016 - 06/05/2016 Hospital Admission    She was admitted to the hospital for cycle 1 of ICE chemotherapy  06/24/2016 - 06/26/2016 Hospital Admission    She received cycle 2 of ICE chemo      07/07/2016 PET scan    Resolution of prior hypermetabolic adenopathy and resolution of prior osseous foci of hypermetabolic activity compatible with essentially complete response to therapy (Deauville 1). 2. Generalized reduced activity in the L4 vertebral body and the type of finding which would typically reflect prior radiation therapy. 3. Stable septated fatty right pelvic lesion, possibly a dermoid or lipoma, not hypermetabolic      1/61/0960 PET scan    Hypermetabolic lesion along the L3 vertebral body and left posterior elements, max SUV 7.8 (Deauville 5).  Hypermetabolic lesion along the left inferior pubic ramus, max SUV 4.9 (Deauville 4).  IMPRESSION: Prevascular lymphadenopathy, reflecting nodal recurrence (Deauville 4). Hypermetabolic osseous metastases involving the L3 vertebral body/posterior elements and left inferior pubic ramus (Deauville 4-5). Hypermetabolism along the endometrium, new, possibly reactive/physiologic. Consider pelvic ultrasound and/or endometrial sampling as clinically warranted.      11/04/2016 - 02/24/2017 Chemotherapy    She received Brentuximab      12/11/2016 PET scan    1. Mixed response to therapy within the skeleton. Lesions at L3 is decreased in size and metabolic activity. Residual activity remains above liver ( Deauville 4) 2. Increased activity in the LEFT sacrum with metabolic activity above liver activity ( Deauville 4 3. Decrease in size and metabolic activity of anterior mediastinal tissue consistent with resolution of thymic hyperplasia or resolution of lymphoma metabolic activity ( Deauville 2). 4. No new lymphadenopathy. Normal spleen and liver.      03/25/2017 PET scan    1. Increased metabolic activity in several normal sized retroperitoneal and right pelvic lymph nodes, Deauville 4. 2. Increase in size and metabolic activity of lesions in the L3 and L4 vertebral bodies and in the left sacrum, Deauville 5. 3. No recurrence of anterior mediastinal abnormal activity. 4. Other imaging findings of potential clinical significance: Chronic ethmoid and left maxillary sinusitis. Paraseptal emphysema at the lung apices. Right ovarian dermoid.      04/06/2017 -  Chemotherapy    The patient had Keytruda      06/09/2017 PET scan    1. Overall significant improvement with reduction in nodal activity. Several retroperitoneal nodes which were previously Deauville 4 are currently Deauville 3. The right common iliac lymph node remains at Deauville 4 with maximum SUV of 3.4, but is improved from prior SUV of  4.3. 2. The bony lesions show the greatest improvement, with the previously Deauville 5 hypermetabolic lesions currently no longer of higher metabolic activity than surrounding bone marrow, currently measuring at Deauville 3. 3. 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 4. No new lesions are identified. 5. Emphysema (ICD10-J43.9).       REVIEW OF SYSTEMS:   Constitutional: Denies fevers, chills or abnormal weight loss Eyes: Denies blurriness of vision Ears, nose, mouth, throat, and face: Denies mucositis or sore throat Respiratory: Denies cough, dyspnea or wheezes Cardiovascular: Denies palpitation, chest discomfort or lower extremity swelling Gastrointestinal:  Denies nausea, heartburn or change in bowel habits Skin: Denies abnormal skin rashes Lymphatics: Denies new lymphadenopathy or easy bruising Neurological:Denies numbness, tingling or new weaknesses Behavioral/Psych: Mood is stable, no new changes  All other systems were reviewed with the patient and are negative.  I have reviewed the past medical history, past surgical history, social history and family history with the patient and they are unchanged from previous note.  ALLERGIES:  is allergic  to temazepam.  MEDICATIONS:  Current Outpatient Medications  Medication Sig Dispense Refill  . atazanavir (REYATAZ) 300 MG capsule Take 1 capsule (300 mg total) by mouth daily with breakfast. Take with Norvir. 30 capsule 5  . cholecalciferol (VITAMIN D) 1000 units tablet Take 1 tablet (1,000 Units total) by mouth daily. 30 tablet 9  . emtricitabine-tenofovir (TRUVADA) 200-300 MG tablet Take 1 tablet by mouth daily. 30 tablet 5  . lidocaine-prilocaine (EMLA) cream Apply 1 application topically as needed. 30 g 11  . Polyvinyl Alcohol-Povidone (CLEAR EYES ALL SEASONS OP) Place 1 drop into both eyes daily.    . ritonavir (NORVIR) 100 MG TABS tablet Take 1 tablet by mouth every morning with  breakfast. Take with Reyataz. 30 tablet 5  . traMADol (ULTRAM) 50 MG tablet Take 2 tablets (100 mg total) by mouth every 6 (six) hours as needed for moderate pain. 90 tablet 0  . triamcinolone cream (KENALOG) 0.1 % Apply 1 application topically 2 (two) times daily. (Patient taking differently: Apply 1 application topically 2 (two) times daily as needed (skin irritation). ) 30 g 0   No current facility-administered medications for this visit.    Facility-Administered Medications Ordered in Other Visits  Medication Dose Route Frequency Provider Last Rate Last Dose  . heparin lock flush 100 unit/mL  500 Units Intracatheter Once PRN Alvy Bimler, Marche Hottenstein, MD      . sodium chloride flush (NS) 0.9 % injection 10 mL  10 mL Intracatheter PRN Alvy Bimler, Germaine Ripp, MD        PHYSICAL EXAMINATION: ECOG PERFORMANCE STATUS: 1 - Symptomatic but completely ambulatory  Vitals:   07/22/17 0902  BP: 130/89  Pulse: 78  Resp: 18  Temp: 98.3 F (36.8 C)   Filed Weights   07/22/17 0902  Weight: 119 lb 8 oz (54.2 kg)    GENERAL:alert, no distress and comfortable SKIN: skin color, texture, turgor are normal, no rashes or significant lesions EYES: normal, Conjunctiva are pink and non-injected, sclera clear OROPHARYNX:no exudate, no erythema and lips, buccal mucosa, and tongue normal  NECK: supple, thyroid normal size, non-tender, without nodularity LYMPH:  no palpable lymphadenopathy in the cervical, axillary or inguinal LUNGS: clear to auscultation and percussion with normal breathing effort HEART: Noted bradycardia and occasional irregular heartbeat, and no lower extremity edema ABDOMEN:abdomen soft, non-tender and normal bowel sounds Musculoskeletal:no cyanosis of digits and no clubbing  NEURO: alert & oriented x 3 with fluent speech, no focal motor/sensory deficits  LABORATORY DATA:  I have reviewed the data as listed    Component Value Date/Time   NA 139 07/22/2017 0815   NA 140 04/29/2017 0745   K 3.9  07/22/2017 0815   K 3.3 (L) 04/29/2017 0745   CL 106 07/22/2017 0815   CO2 25 07/22/2017 0815   CO2 23 04/29/2017 0745   GLUCOSE 86 07/22/2017 0815   GLUCOSE 83 04/29/2017 0745   BUN 13 07/22/2017 0815   BUN 10.7 04/29/2017 0745   CREATININE 0.82 07/22/2017 0815   CREATININE 1.2 (H) 04/29/2017 0745   CALCIUM 9.5 07/22/2017 0815   CALCIUM 9.8 04/29/2017 0745   PROT 6.4 07/22/2017 0815   PROT 7.6 04/29/2017 0745   ALBUMIN 3.6 07/22/2017 0815   ALBUMIN 4.0 04/29/2017 0745   AST 14 07/22/2017 0815   AST 19 04/29/2017 0745   ALT 24 07/22/2017 0815   ALT 19 04/29/2017 0745   ALKPHOS 53 07/22/2017 0815   ALKPHOS 81 04/29/2017 0745   BILITOT 0.6 07/22/2017 0815  BILITOT 1.31 (H) 04/29/2017 0745   GFRNONAA >60 07/22/2017 0815   GFRNONAA 83 02/20/2016 0908   GFRAA >60 07/22/2017 0815   GFRAA >89 02/20/2016 0908    No results found for: SPEP, UPEP  Lab Results  Component Value Date   WBC 12.2 (H) 07/22/2017   NEUTROABS 9.2 (H) 07/22/2017   HGB 14.9 07/22/2017   HCT 43.6 07/22/2017   MCV 104.3 (H) 07/22/2017   PLT 231 07/22/2017      Chemistry      Component Value Date/Time   NA 139 07/22/2017 0815   NA 140 04/29/2017 0745   K 3.9 07/22/2017 0815   K 3.3 (L) 04/29/2017 0745   CL 106 07/22/2017 0815   CO2 25 07/22/2017 0815   CO2 23 04/29/2017 0745   BUN 13 07/22/2017 0815   BUN 10.7 04/29/2017 0745   CREATININE 0.82 07/22/2017 0815   CREATININE 1.2 (H) 04/29/2017 0745      Component Value Date/Time   CALCIUM 9.5 07/22/2017 0815   CALCIUM 9.8 04/29/2017 0745   ALKPHOS 53 07/22/2017 0815   ALKPHOS 81 04/29/2017 0745   AST 14 07/22/2017 0815   AST 19 04/29/2017 0745   ALT 24 07/22/2017 0815   ALT 19 04/29/2017 0745   BILITOT 0.6 07/22/2017 0815   BILITOT 1.31 (H) 04/29/2017 0745       All questions were answered. The patient knows to call the clinic with any problems, questions or concerns. No barriers to learning was detected.  I spent 25 minutes  counseling the patient face to face. The total time spent in the appointment was 30 minutes and more than 50% was on counseling and review of test results  Heath Lark, MD 07/22/2017 11:10 AM

## 2017-07-22 NOTE — Assessment & Plan Note (Signed)
Recent PET/CT scan show positive response to treatment We will continue treatment indefinitely I plan to scan her every 3 months, next imaging study would be around May 2019.

## 2017-07-22 NOTE — Assessment & Plan Note (Signed)
Patient is noted to have bradycardia She is not symptomatic I recommend discontinuation of trazodone We will proceed with treatment today without delay

## 2017-07-23 ENCOUNTER — Telehealth: Payer: Self-pay | Admitting: Hematology and Oncology

## 2017-07-23 NOTE — Telephone Encounter (Signed)
Mailed patient calendar of upcoming April and may appointments.  °

## 2017-08-02 ENCOUNTER — Other Ambulatory Visit: Payer: Self-pay | Admitting: *Deleted

## 2017-08-02 DIAGNOSIS — B20 Human immunodeficiency virus [HIV] disease: Secondary | ICD-10-CM

## 2017-08-02 MED ORDER — TRAMADOL HCL 50 MG PO TABS: 100.0000 mg | ORAL_TABLET | Freq: Four times a day (QID) | ORAL | 0 refills | Status: DC | PRN

## 2017-08-05 ENCOUNTER — Other Ambulatory Visit: Payer: Self-pay

## 2017-08-05 ENCOUNTER — Emergency Department (HOSPITAL_COMMUNITY)
Admission: EM | Admit: 2017-08-05 | Discharge: 2017-08-05 | Disposition: A | Discharge status: Home / Self Care | Payer: 59 | Attending: Emergency Medicine | Admitting: Emergency Medicine

## 2017-08-05 ENCOUNTER — Emergency Department (HOSPITAL_COMMUNITY): Payer: 59

## 2017-08-05 ENCOUNTER — Encounter (HOSPITAL_COMMUNITY): Payer: Self-pay | Admitting: Emergency Medicine

## 2017-08-05 DIAGNOSIS — R1084 Generalized abdominal pain: Secondary | ICD-10-CM

## 2017-08-05 DIAGNOSIS — R110 Nausea: Secondary | ICD-10-CM

## 2017-08-05 DIAGNOSIS — Z79899 Other long term (current) drug therapy: Secondary | ICD-10-CM | POA: Insufficient documentation

## 2017-08-05 DIAGNOSIS — R1030 Lower abdominal pain, unspecified: Secondary | ICD-10-CM | POA: Diagnosis present

## 2017-08-05 DIAGNOSIS — F17210 Nicotine dependence, cigarettes, uncomplicated: Secondary | ICD-10-CM | POA: Diagnosis not present

## 2017-08-05 DIAGNOSIS — R1110 Vomiting, unspecified: Secondary | ICD-10-CM

## 2017-08-05 LAB — LIPASE, BLOOD: Lipase: 24 U/L (ref 11–51)

## 2017-08-05 LAB — RAPID URINE DRUG SCREEN, HOSP PERFORMED
AMPHETAMINES: NOT DETECTED
BENZODIAZEPINES: NOT DETECTED
Barbiturates: NOT DETECTED
COCAINE: NOT DETECTED
OPIATES: POSITIVE — AB
Tetrahydrocannabinol: POSITIVE — AB

## 2017-08-05 LAB — CBC
HCT: 43.3 % (ref 36.0–46.0)
Hemoglobin: 15.1 g/dL — ABNORMAL HIGH (ref 12.0–15.0)
MCH: 36.5 pg — AB (ref 26.0–34.0)
MCHC: 34.9 g/dL (ref 30.0–36.0)
MCV: 104.6 fL — ABNORMAL HIGH (ref 78.0–100.0)
Platelets: 208 10*3/uL (ref 150–400)
RBC: 4.14 MIL/uL (ref 3.87–5.11)
RDW: 17.4 % — AB (ref 11.5–15.5)
WBC: 11.7 10*3/uL — ABNORMAL HIGH (ref 4.0–10.5)

## 2017-08-05 LAB — DIFFERENTIAL
Basophils Absolute: 0 10*3/uL (ref 0.0–0.1)
Basophils Relative: 0 %
EOS ABS: 0.1 10*3/uL (ref 0.0–0.7)
Eosinophils Relative: 0 %
LYMPHS ABS: 1.9 10*3/uL (ref 0.7–4.0)
LYMPHS PCT: 16 %
MONO ABS: 0.6 10*3/uL (ref 0.1–1.0)
MONOS PCT: 5 %
NEUTROS PCT: 79 %
Neutro Abs: 9.1 10*3/uL — ABNORMAL HIGH (ref 1.7–7.7)

## 2017-08-05 LAB — URINALYSIS, ROUTINE W REFLEX MICROSCOPIC
BILIRUBIN URINE: NEGATIVE
Bacteria, UA: NONE SEEN
Glucose, UA: NEGATIVE mg/dL
Hgb urine dipstick: NEGATIVE
Ketones, ur: NEGATIVE mg/dL
NITRITE: NEGATIVE
PH: 8 (ref 5.0–8.0)
Protein, ur: NEGATIVE mg/dL
SPECIFIC GRAVITY, URINE: 1.034 — AB (ref 1.005–1.030)

## 2017-08-05 LAB — COMPREHENSIVE METABOLIC PANEL
ALBUMIN: 4.1 g/dL (ref 3.5–5.0)
ALK PHOS: 50 U/L (ref 38–126)
ALT: 27 U/L (ref 14–54)
AST: 20 U/L (ref 15–41)
Anion gap: 10 (ref 5–15)
BILIRUBIN TOTAL: 1.4 mg/dL — AB (ref 0.3–1.2)
BUN: 13 mg/dL (ref 6–20)
CALCIUM: 9.1 mg/dL (ref 8.9–10.3)
CO2: 24 mmol/L (ref 22–32)
Chloride: 108 mmol/L (ref 101–111)
Creatinine, Ser: 0.81 mg/dL (ref 0.44–1.00)
GFR calc Af Amer: >60 mL/min (ref >60)
GFR calc non Af Amer: >60 mL/min (ref >60)
GLUCOSE: 111 mg/dL — AB (ref 65–99)
Potassium: 3.7 mmol/L (ref 3.5–5.1)
SODIUM: 142 mmol/L (ref 135–145)
TOTAL PROTEIN: 7.5 g/dL (ref 6.5–8.1)

## 2017-08-05 LAB — I-STAT BETA HCG BLOOD, ED (MC, WL, AP ONLY)

## 2017-08-05 MED ORDER — DIPHENHYDRAMINE HCL 50 MG/ML IJ SOLN
25.0000 mg | Freq: Once | INTRAMUSCULAR | Status: AC
  Administered 2017-08-05: 25 mg via INTRAVENOUS
  Filled 2017-08-05: qty 1

## 2017-08-05 MED ORDER — SODIUM CHLORIDE 0.9 % IV BOLUS
2000.0000 mL | Freq: Once | INTRAVENOUS | Status: AC
  Administered 2017-08-05: 2000 mL via INTRAVENOUS

## 2017-08-05 MED ORDER — SODIUM CHLORIDE 0.9 % IJ SOLN
INTRAMUSCULAR | Status: AC
  Administered 2017-08-05: 11:00:00
  Filled 2017-08-05: qty 50

## 2017-08-05 MED ORDER — ONDANSETRON HCL 4 MG/2ML IJ SOLN
4.0000 mg | Freq: Once | INTRAMUSCULAR | Status: AC
  Administered 2017-08-05: 4 mg via INTRAVENOUS
  Filled 2017-08-05: qty 2

## 2017-08-05 MED ORDER — MORPHINE SULFATE (PF) 4 MG/ML IV SOLN
4.0000 mg | Freq: Once | INTRAVENOUS | Status: AC
  Administered 2017-08-05: 4 mg via INTRAVENOUS
  Filled 2017-08-05: qty 1

## 2017-08-05 MED ORDER — IOPAMIDOL (ISOVUE-300) INJECTION 61%
INTRAVENOUS | Status: AC
  Administered 2017-08-05: 100 mL via INTRAVENOUS
  Filled 2017-08-05: qty 100

## 2017-08-05 MED ORDER — DIPHENOXYLATE-ATROPINE 2.5-0.025 MG PO TABS
2.0000 | ORAL_TABLET | Freq: Once | ORAL | Status: AC
  Administered 2017-08-05: 2 via ORAL
  Filled 2017-08-05: qty 2

## 2017-08-05 MED ORDER — IOPAMIDOL (ISOVUE-300) INJECTION 61%
100.0000 mL | Freq: Once | INTRAVENOUS | Status: AC | PRN
  Administered 2017-08-05: 100 mL via INTRAVENOUS

## 2017-08-05 MED ORDER — ONDANSETRON 4 MG PO TBDP: 4.0000 mg | ORAL_TABLET | Freq: Three times a day (TID) | ORAL | 0 refills | Status: DC | PRN

## 2017-08-05 MED ORDER — HALOPERIDOL LACTATE 5 MG/ML IJ SOLN
2.0000 mg | Freq: Four times a day (QID) | INTRAMUSCULAR | Status: DC | PRN
  Administered 2017-08-05: 2 mg via INTRAVENOUS
  Filled 2017-08-05: qty 1
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
## 2017-08-05 NOTE — ED Notes (Signed)
Pt is requesting something to drink and pain medication.

## 2017-08-05 NOTE — ED Notes (Signed)
ED Provider at bedside. 

## 2017-08-05 NOTE — ED Notes (Signed)
Called lab. Adela Lank is adding on Differential.

## 2017-08-05 NOTE — ED Triage Notes (Signed)
Pt complaint of abdominal pain with n/v/d at 0100.

## 2017-08-05 NOTE — Discharge Instructions (Addendum)
Avoid daily or excess marijuana use.  It can lead to sudden unexplained episodes of vomiting and abdominal pain.  Zofran for nausea.

## 2017-08-05 NOTE — ED Notes (Signed)
Bed: KG67 Expected date: 08/05/17 Expected time: 5:57 AM Means of arrival:  Comments:

## 2017-08-11 ENCOUNTER — Other Ambulatory Visit: Payer: Self-pay

## 2017-08-11 ENCOUNTER — Ambulatory Visit (HOSPITAL_COMMUNITY)
Admission: EM | Admit: 2017-08-11 | Discharge: 2017-08-11 | Disposition: A | Discharge status: Home / Self Care | Payer: 59 | Attending: Urgent Care | Admitting: Urgent Care

## 2017-08-11 ENCOUNTER — Encounter (HOSPITAL_COMMUNITY): Payer: Self-pay | Admitting: Emergency Medicine

## 2017-08-11 DIAGNOSIS — K0889 Other specified disorders of teeth and supporting structures: Secondary | ICD-10-CM

## 2017-08-11 DIAGNOSIS — K047 Periapical abscess without sinus: Secondary | ICD-10-CM | POA: Diagnosis not present

## 2017-08-11 MED ORDER — IBUPROFEN 600 MG PO TABS: 600.0000 mg | ORAL_TABLET | Freq: Four times a day (QID) | ORAL | 0 refills | Status: DC | PRN

## 2017-08-11 MED ORDER — AMOXICILLIN-POT CLAVULANATE 875-125 MG PO TABS: 1.0000 | ORAL_TABLET | Freq: Two times a day (BID) | ORAL | 0 refills | Status: DC

## 2017-08-11 NOTE — Discharge Instructions (Addendum)
You may take 500mg Tylenol with ibuprofen 600mg every 6 hours for pain and inflammation. ° °

## 2017-08-11 NOTE — ED Triage Notes (Signed)
Onset 3 weeks ago of dental pain.  Dental appt is may 6.  Pain is top, left tooth

## 2017-08-11 NOTE — ED Provider Notes (Signed)
MRN: [id number] DOB: 1986-02-12  Subjective:   [name] is a 32 y.o. female presenting for recurrent dental pain, facial swelling over left side, fever, difficulty chewing. She is able to swallow liquids. She has a dental appointment on Aug 30, 2017. Smokes cigarettes occasionally.  No current facility-administered medications for this encounter.   Current Outpatient Medications:  .  amoxicillin-clavulanate (AUGMENTIN) 875-125 MG tablet, Take 1 tablet by mouth every 12 (twelve) hours., Disp: 20 tablet, Rfl: 0 .  atazanavir (REYATAZ) 300 MG capsule, Take 1 capsule (300 mg total) by mouth daily with breakfast. Take with Norvir., Disp: 30 capsule, Rfl: 5 .  cholecalciferol (VITAMIN D) 1000 units tablet, Take 1 tablet (1,000 Units total) by mouth daily. (Patient not taking: Reported on 08/05/2017), Disp: 30 tablet, Rfl: 9 .  emtricitabine-tenofovir (TRUVADA) 200-300 MG tablet, Take 1 tablet by mouth daily., Disp: 30 tablet, Rfl: 5 .  ibuprofen (ADVIL,MOTRIN) 600 MG tablet, Take 1 tablet (600 mg total) by mouth every 6 (six) hours as needed., Disp: 30 tablet, Rfl: 0 .  lidocaine-prilocaine (EMLA) cream, Apply 1 application topically as needed., Disp: 30 g, Rfl: 11 .  ondansetron (ZOFRAN ODT) 4 MG disintegrating tablet, Take 1 tablet (4 mg total) by mouth every 8 (eight) hours as needed for nausea., Disp: 6 tablet, Rfl: 0 .  Polyvinyl Alcohol-Povidone (CLEAR EYES ALL SEASONS OP), Place 1 drop into both eyes daily., Disp: , Rfl:  .  ritonavir (NORVIR) 100 MG TABS tablet, Take 1 tablet by mouth every morning with breakfast. Take with Reyataz., Disp: 30 tablet, Rfl: 5 .  traMADol (ULTRAM) 50 MG tablet, Take 2 tablets (100 mg total) by mouth every 6 (six) hours as needed for moderate pain., Disp: 90 tablet, Rfl: 0 .  triamcinolone cream (KENALOG) 0.1 %, Apply 1 application topically 2 (two) times daily. (Patient taking differently: Apply 1 application topically 2 (two) times daily as needed (skin  irritation). ), Disp: 30 g, Rfl: 0   Allergies  Allergen Reactions  . Temazepam Other (See Comments)    Reaction:  Confusion/dizziness     Past Medical History:  Diagnosis Date  . AIN III (anal intraepithelial neoplasia III)   . Anemia   . Cancer ([place])    Hodgkin lymphoma  . Chest wall pain 06/27/2015  . Condyloma acuminatum in female   . Depression   . History of chronic bronchitis   . History of esophagitis    CANDIDA  . History of shingles   . HIV (human immunodeficiency virus infection) ([place])   . Hodgkin's lymphoma ([place]) 06/12/2014  . HSV (herpes simplex virus) infection   . Hypokalemia 07/17/2014  . Periodontitis, chronic   . Screening examination for venereal disease 10/30/2013     Past Surgical History:  Procedure Laterality Date  . DIAGNOSTIC LAPAROSCOPY WITH REMOVAL OF ECTOPIC PREGNANCY N/A 11/17/2015   Procedure: LAPAROSCOPY LEFT  SALPINGECTOMY SECONDARY TO LEFT ECTOPIC PREGNANCY;  Surgeon: Jonnie Kind, MD;  Location: [facility];  Service: Gynecology;  Laterality: N/A;  . DILATION AND CURETTAGE OF UTERUS  2005   MISSED AB  . EXAMINATION UNDER ANESTHESIA N/A 09/23/2012   Procedure: EXAM UNDER ANESTHESIA;  Surgeon: Adin Hector, MD;  Location: [facility];  Service: General;  Laterality: N/A;  . IR GENERIC HISTORICAL  05/26/2016   IR FLUORO GUIDE PORT INSERTION RIGHT 05/26/2016 [facility]  . IR GENERIC HISTORICAL  05/26/2016   IR US GUIDE VASC ACCESS RIGHT 05/26/2016 [facility]  . LASER ABLATION  CONDOLAMATA N/A 09/23/2012   Procedure: REMOVAL/ABLATION  ABLATION CONDOLAMATA WARTS;  Surgeon: Adin Hector, MD;  Location: [facility];  Service: General;  Laterality: N/A;    Objective:   Vitals: BP 120/80 (BP Location: Left Arm)   Pulse 93   Temp 99 F (37.2 C) (Oral)   Resp 18   SpO2 100%   Physical Exam  Constitutional: She is oriented to person, place, and time. She appears well-developed and well-nourished.  HENT:    Mouth/Throat:    Cardiovascular: Normal rate.  Pulmonary/Chest: Effort normal.  Neurological: She is alert and oriented to person, place, and time.   Assessment and Plan :   Dental infection  Dentalgia  Will have patient start Augmentin, emphasized need to keep his appointment with dentist.   Jaynee Eagles, PA-C 08/12/17 [phone]

## 2017-08-12 ENCOUNTER — Inpatient Hospital Stay: Payer: 59

## 2017-08-12 ENCOUNTER — Encounter: Payer: Self-pay | Admitting: Hematology and Oncology

## 2017-08-12 ENCOUNTER — Inpatient Hospital Stay (HOSPITAL_BASED_OUTPATIENT_CLINIC_OR_DEPARTMENT_OTHER): Payer: 59 | Admitting: Hematology and Oncology

## 2017-08-12 ENCOUNTER — Inpatient Hospital Stay: Payer: 59 | Attending: Hematology and Oncology

## 2017-08-12 DIAGNOSIS — G620 Drug-induced polyneuropathy: Secondary | ICD-10-CM

## 2017-08-12 DIAGNOSIS — Z5111 Encounter for antineoplastic chemotherapy: Secondary | ICD-10-CM | POA: Diagnosis present

## 2017-08-12 DIAGNOSIS — Z95828 Presence of other vascular implants and grafts: Secondary | ICD-10-CM

## 2017-08-12 DIAGNOSIS — T451X5A Adverse effect of antineoplastic and immunosuppressive drugs, initial encounter: Secondary | ICD-10-CM

## 2017-08-12 DIAGNOSIS — Z79899 Other long term (current) drug therapy: Secondary | ICD-10-CM | POA: Insufficient documentation

## 2017-08-12 DIAGNOSIS — Z452 Encounter for adjustment and management of vascular access device: Secondary | ICD-10-CM | POA: Diagnosis not present

## 2017-08-12 DIAGNOSIS — B20 Human immunodeficiency virus [HIV] disease: Secondary | ICD-10-CM

## 2017-08-12 DIAGNOSIS — C8118 Nodular sclerosis classical Hodgkin lymphoma, lymph nodes of multiple sites: Secondary | ICD-10-CM

## 2017-08-12 DIAGNOSIS — C8198 Hodgkin lymphoma, unspecified, lymph nodes of multiple sites: Secondary | ICD-10-CM

## 2017-08-12 DIAGNOSIS — Z5112 Encounter for antineoplastic immunotherapy: Secondary | ICD-10-CM | POA: Insufficient documentation

## 2017-08-12 DIAGNOSIS — R635 Abnormal weight gain: Secondary | ICD-10-CM | POA: Insufficient documentation

## 2017-08-12 DIAGNOSIS — L708 Other acne: Secondary | ICD-10-CM | POA: Diagnosis not present

## 2017-08-12 LAB — COMPREHENSIVE METABOLIC PANEL
ALBUMIN: 3.9 g/dL (ref 3.5–5.0)
ALT: 24 U/L (ref 0–55)
AST: 15 U/L (ref 5–34)
Alkaline Phosphatase: 60 U/L (ref 40–150)
Anion gap: 8 (ref 3–11)
BUN: 10 mg/dL (ref 7–26)
CHLORIDE: 107 mmol/L (ref 98–109)
CO2: 25 mmol/L (ref 22–29)
CREATININE: 0.85 mg/dL (ref 0.60–1.10)
Calcium: 9.7 mg/dL (ref 8.4–10.4)
GFR calc Af Amer: >60 mL/min (ref >60)
GFR calc non Af Amer: >60 mL/min (ref >60)
GLUCOSE: 78 mg/dL (ref 70–140)
Potassium: 3.9 mmol/L (ref 3.5–5.1)
SODIUM: 140 mmol/L (ref 136–145)
Total Bilirubin: 1 mg/dL (ref 0.2–1.2)
Total Protein: 7.1 g/dL (ref 6.4–8.3)

## 2017-08-12 LAB — CBC WITH DIFFERENTIAL/PLATELET
BASOS ABS: 0 10*3/uL (ref 0.0–0.1)
BASOS PCT: 0 %
EOS ABS: 0 10*3/uL (ref 0.0–0.5)
EOS PCT: 0 %
HCT: 40.8 % (ref 34.8–46.6)
Hemoglobin: 14.2 g/dL (ref 11.6–15.9)
LYMPHS PCT: 29 %
Lymphs Abs: 2.6 10*3/uL (ref 0.9–3.3)
MCH: 36.4 pg — ABNORMAL HIGH (ref 25.1–34.0)
MCHC: 34.8 g/dL (ref 31.5–36.0)
MCV: 104.6 fL — AB (ref 79.5–101.0)
MONO ABS: 0.5 10*3/uL (ref 0.1–0.9)
Monocytes Relative: 5 %
Neutro Abs: 5.8 10*3/uL (ref 1.5–6.5)
Neutrophils Relative %: 66 %
PLATELETS: 234 10*3/uL (ref 145–400)
RBC: 3.9 MIL/uL (ref 3.70–5.45)
RDW: 16.7 % — AB (ref 11.2–14.5)
WBC: 8.9 10*3/uL (ref 3.9–10.3)

## 2017-08-12 LAB — TSH: TSH: 1.481 u[IU]/mL (ref 0.308–3.960)

## 2017-08-12 LAB — PREGNANCY, URINE: Preg Test, Ur: NEGATIVE

## 2017-08-12 MED ORDER — SODIUM CHLORIDE 0.9 % IV SOLN
Freq: Once | INTRAVENOUS | Status: AC
  Administered 2017-08-12: 11:00:00 via INTRAVENOUS

## 2017-08-12 MED ORDER — HEPARIN SOD (PORK) LOCK FLUSH 100 UNIT/ML IV SOLN
500.0000 [IU] | Freq: Once | INTRAVENOUS | Status: AC | PRN
  Administered 2017-08-12: 500 [IU]
  Filled 2017-08-12: qty 5

## 2017-08-12 MED ORDER — TRAMADOL HCL 50 MG PO TABS: 100.0000 mg | ORAL_TABLET | Freq: Four times a day (QID) | ORAL | 0 refills | Status: DC | PRN

## 2017-08-12 MED ORDER — SODIUM CHLORIDE 0.9% FLUSH
10.0000 mL | INTRAVENOUS | Status: DC | PRN
  Administered 2017-08-12: 10 mL
  Filled 2017-08-12: qty 10

## 2017-08-12 MED ORDER — SODIUM CHLORIDE 0.9% FLUSH
10.0000 mL | INTRAVENOUS | Status: DC | PRN
  Administered 2017-08-12: 10 mL via INTRAVENOUS
  Filled 2017-08-12: qty 10

## 2017-08-12 MED ORDER — SODIUM CHLORIDE 0.9 % IV SOLN
200.0000 mg | Freq: Once | INTRAVENOUS | Status: AC
  Administered 2017-08-12: 200 mg via INTRAVENOUS
  Filled 2017-08-12: qty 8

## 2017-08-12 MED ORDER — LEUPROLIDE ACETATE 7.5 MG IM KIT
7.5000 mg | PACK | Freq: Once | INTRAMUSCULAR | Status: AC
  Administered 2017-08-12: 7.5 mg via INTRAMUSCULAR
  Filled 2017-08-12: qty 7.5

## 2017-08-12 MED ORDER — METRONIDAZOLE 1 % EX GEL: Freq: Two times a day (BID) | CUTANEOUS | 0 refills | Status: DC

## 2017-08-12 MED ORDER — ALTEPLASE 2 MG IJ SOLR
2.0000 mg | Freq: Once | INTRAMUSCULAR | Status: AC | PRN
  Administered 2017-08-12: 2 mg
  Filled 2017-08-12: qty 2

## 2017-08-12 NOTE — Assessment & Plan Note (Signed)
She has mild acne on her face I recommend topical antibiotic cream

## 2017-08-12 NOTE — Assessment & Plan Note (Signed)
she has mild peripheral neuropathy, likely related to side effects of treatment. It is only mild, not bothering the patient. I will observe for now If it gets worse in the future, I will consider modifying the dose of the treatment  

## 2017-08-12 NOTE — Patient Instructions (Addendum)
[facility] Discharge Instructions for Patients Receiving Chemotherapy  Today you received the following chemotherapy agents:  Keytruda.  To help prevent nausea and vomiting after your treatment, we encourage you to take your nausea medication as directed.   If you develop nausea and vomiting that is not controlled by your nausea medication, call the clinic.   BELOW ARE SYMPTOMS THAT SHOULD BE REPORTED IMMEDIATELY:  *FEVER GREATER THAN 100.5 F  *CHILLS WITH OR WITHOUT FEVER  NAUSEA AND VOMITING THAT IS NOT CONTROLLED WITH YOUR NAUSEA MEDICATION  *UNUSUAL SHORTNESS OF BREATH  *UNUSUAL BRUISING OR BLEEDING  TENDERNESS IN MOUTH AND THROAT WITH OR WITHOUT PRESENCE OF ULCERS  *URINARY PROBLEMS  *BOWEL PROBLEMS  UNUSUAL RASH Items with * indicate a potential emergency and should be followed up as soon as possible.  Feel free to call the clinic should you have any questions or concerns. The clinic phone number is [phone].  Please show the [facility] at check-in to the Emergency Department and triage nurse.  Leuprolide injection What is this medicine? LEUPROLIDE (loo PROE lide) is a man-made hormone. It is used to treat the symptoms of prostate cancer. This medicine may also be used to treat children with early onset of puberty. It may be used for other hormonal conditions. This medicine may be used for other purposes; ask your health care provider or pharmacist if you have questions. COMMON BRAND NAME(S): Lupron What should I tell my health care provider before I take this medicine? They need to know if you have any of these conditions: -diabetes -heart disease or previous heart attack -high blood pressure -high cholesterol -pain or difficulty passing urine -spinal cord metastasis -stroke -tobacco smoker -an unusual or allergic reaction to leuprolide, benzyl alcohol, other medicines, foods, dyes, or preservatives -pregnant or trying to get  pregnant -breast-feeding How should I use this medicine? This medicine is for injection under the skin or into a muscle. You will be taught how to prepare and give this medicine. Use exactly as directed. Take your medicine at regular intervals. Do not take your medicine more often than directed. It is important that you put your used needles and syringes in a special sharps container. Do not put them in a trash can. If you do not have a sharps container, call your pharmacist or healthcare provider to get one. A special MedGuide will be given to you by the pharmacist with each prescription and refill. Be sure to read this information carefully each time. Talk to your pediatrician regarding the use of this medicine in children. While this medicine may be prescribed for children as young as 8 years for selected conditions, precautions do apply. Overdosage: If you think you have taken too much of this medicine contact a poison control center or emergency room at once. NOTE: This medicine is only for you. Do not share this medicine with others. What if I miss a dose? If you miss a dose, take it as soon as you can. If it is almost time for your next dose, take only that dose. Do not take double or extra doses. What may interact with this medicine? Do not take this medicine with any of the following medications: -chasteberry This medicine may also interact with the following medications: -herbal or dietary supplements, like black cohosh or DHEA -female hormones, like estrogens or progestins and birth control pills, patches, rings, or injections -female hormones, like testosterone This list may not describe all possible interactions. Give your  health care provider a list of all the medicines, herbs, non-prescription drugs, or dietary supplements you use. Also tell them if you smoke, drink alcohol, or use illegal drugs. Some items may interact with your medicine. What should I watch for while using this  medicine? Visit your doctor or health care professional for regular checks on your progress. During the first week, your symptoms may get worse, but then will improve as you continue your treatment. You may get hot flashes, increased bone pain, increased difficulty passing urine, or an aggravation of nerve symptoms. Discuss these effects with your doctor or health care professional, some of them may improve with continued use of this medicine. Female patients may experience a menstrual cycle or spotting during the first 2 months of therapy with this medicine. If this continues, contact your doctor or health care professional. What side effects may I notice from receiving this medicine? Side effects that you should report to your doctor or health care professional as soon as possible: -allergic reactions like skin rash, itching or hives, swelling of the face, lips, or tongue -breathing problems -chest pain -depression or memory disorders -pain in your legs or groin -pain at site where injected -severe headache -swelling of the feet and legs -visual changes -vomiting Side effects that usually do not require medical attention (report to your doctor or health care professional if they continue or are bothersome): -breast swelling or tenderness -decrease in sex drive or performance -diarrhea -hot flashes -loss of appetite -muscle, joint, or bone pains -nausea -redness or irritation at site where injected -skin problems or acne This list may not describe all possible side effects. Call your doctor for medical advice about side effects. You may report side effects to FDA at 1-800-FDA-1088. Where should I keep my medicine? Keep out of the reach of children. Store below 25 degrees C (77 degrees F). Do not freeze. Protect from light. Do not use if it is not clear or if there are particles present. Throw away any unused medicine after the expiration date. NOTE: This sheet is a summary. It may not  cover all possible information. If you have questions about this medicine, talk to your doctor, pharmacist, or health care provider.  2018 [employer]vier/Gold Standard (2015-12-02 10:54:35)

## 2017-08-12 NOTE — Assessment & Plan Note (Signed)
Recent PET/CT scan show positive response to treatment We will continue treatment indefinitely I plan to scan her every 3 months, next imaging study would be around end of May 2019.

## 2017-08-12 NOTE — Progress Notes (Signed)
Port-A-Cath accessed. Site flushes well with NS, no swelling or pain noted upon flushing with NS. Appox 3 ml blood return noted, no no more blood return noted. Cath-Flo instilled per policy, site marked. Will monitor for blood return.

## 2017-08-12 NOTE — Assessment & Plan Note (Signed)
Her last HIV viral load was mildly detected She will continue anti-retroviral treatment as directed

## 2017-08-12 NOTE — Progress Notes (Signed)
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  Human immunodeficiency virus (HIV) disease ([place]) Her last HIV viral load was mildly detected She will continue anti-retroviral treatment as directed  Peripheral neuropathy due to chemotherapy [facility]) she has mild peripheral neuropathy, likely related to side effects of treatment. It is only mild, not bothering the patient. I will observe for now If it gets worse in the future, I will consider modifying the dose of the treatment   Acneiform eruption She has mild acne on her face I recommend topical antibiotic cream  Weight gain The weight gain is likely due to antiestrogen treatment. TSH level is within normal limits   No orders of the defined types were placed in this encounter.   INTERVAL HISTORY: Please see below for problem oriented charting. She returns for further follow-up She denies complication from recent treatment except for mild weight gain and neuropathy She also complained of mild acneform rash on her face Neuropathy is very mild on her feet only.  SUMMARY OF ONCOLOGIC HISTORY:   Hodgkin lymphoma, nodular sclerosis ([place])   05/06/2014 Imaging    CT scan of the abdomen show diffuse mesenteric lymphadenopathy.      05/07/2014 Imaging    CT scan of the chest show right thoracic inlet lymphadenopathy      06/07/2014  Procedure    She underwent ultrasound-guided core biopsy of the neck lymph node      06/07/2014 Pathology Results    Accession: [id number] biopsy confirmed diagnosis of Hodgkin lymphoma.      06/15/2014 Imaging    Echocardiogram showed preserved ejection fraction      07/09/2014 - 07/12/2014 Hospital Admission    She was admitted to the hospital for severe anemia.      07/27/2014 Procedure    She had placement of port      07/31/2014 - 09/11/2014 Chemotherapy    She received dose adjusted chemotherapy due to abnormal liver function tests and severe anemia. Treatment was delayed due to noncompliance  and subsequently stopped because the patient failed to keep appointments      01/11/2015 Imaging    Repeat PET CT scan showed response to treatment      01/28/2015 - 06/18/2015 Chemotherapy    ABVD was restarted with full dose.      02/08/2015 - 02/10/2015 Hospital Admission    The patient was admitted to the hospital due to pancytopenia and profuse diarrhea. Cultures were negative. She was placed on ciprofloxacin.      02/11/2015 Adverse Reaction    Treatment was placed on hold due to recent infection.      04/11/2015 Imaging    PET CT scan showed near complete response. Incidental finding of an abnormal bone lesion, indeterminate. She is not symptomatic. Recommendation from [facility] to observe      07/11/2015 Imaging    PET CT scan showed abnormal new bone lesions, suggestive of possible disease progression  07/23/2015 Bone Marrow Biopsy    She underwent bone biopsy      07/23/2015 Pathology Results    Accession: [id number]  biopsy was negative for cancer      11/21/2015 Surgery    She had surgery for ectopic pregnancy      01/29/2016 Imaging    Ct chest, abdomen and pelvis showed pelvic and retroperitoneal lymphadenopathy, as above, concerning for residual disease. There is also a mildly enlarged posterior mediastinal lymph node measuring 11 mm adjacent to the distal  descending thoracic aorta. This may represent an additional focus of disease, but is the only finding of concern in the thorax on today's examination. Sclerosis in the right ilium at site of previously noted metabolically active lesion, grossly unchanged. No other definite osseous lesions are identified on today's examination. Spleen is normal in size and appearance.      02/14/2016 PET scan    Interval disease worsening with new foci of hypermetabolic activity in multiple retroperitoneal and pelvic lymph nodes as well as AP window and left hilar lymph nodes. (Deauville 5). There is also overall worsening of the osseous disease.      05/11/2016 Pathology Results    Diagnosis Lymph node, needle/core biopsy, Left para-aortic retroperitoneal - CLASSICAL HODGKIN LYMPHOMA. - SEE ONCOLOGY TABLE. Microscopic Comment LYMPHOMA Histologic type: Classical Hodgkin lymphoma. Grade (if applicable): N/A Flow cytometry: Not done. Immunohistochemical stains: CD15, CD20, CD3, LCA, PAX-5, CD30 with appropriate controls. Touch preps/imprints: Not performed. Comments: The sections show small needle core biopsy fragments displaying a polymorphous cellular proliferation of small lymphocytes, plasma cells, eosinophils, and large atypical mononuclear and multilobated lymphoid cells with features of Reed-Sternberg cells and variants. This is associated with patchy areas of fibrosis. Immunohistochemical stains were performed and show that the large atypical lymphoid cells are positive for CD30, CD15 and PAX-5 and negative for LCA, CD20, CD3. The small lymphoid cells in the background show a mixture of T and B cells with predominance of T cells. The overall morphologic and histologic features are consistent with classical Hodgkin lymphoma. Further subtyping is challenging in limited small biopsy fragments but the patchy fibrosis suggests nodular sclerosis type.       05/11/2016 Procedure    She underwent CT guided biopsy  of retroperitoneal lymph node      05/26/2016 Procedure    Successful placement of a right IJ approach Power Port with ultrasound and fluoroscopic guidance. The catheter is ready for use.      06/02/2016 PET scan    Mixed response to chemotherapy with some lymph nodes decreased in metabolic activity and some lymph nodes increase metabolic activity. Lymph node stations including mediastinum, periaortic retroperitoneum, and obturator node stations. Activity is remains relatively intense Deauville 4 2. LEFT infrahilar nodule / lymph node with intense metabolic activity decreased from prior. ( Deauville 4 ). 3. 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 Deauville 5      06/03/2016 - 06/05/2016 Hospital Admission    She was admitted to the hospital for cycle 1 of ICE chemotherapy      06/24/2016 - 06/26/2016 Hospital Admission    She received cycle 2 of ICE chemo      07/07/2016 PET scan    Resolution of prior hypermetabolic adenopathy and resolution of prior osseous foci of hypermetabolic activity compatible with essentially complete response to therapy (Deauville 1). 2. Generalized reduced activity in the L4 vertebral body and the type of finding which would typically reflect prior radiation therapy. 3. Stable  septated fatty right pelvic lesion, possibly a dermoid or lipoma, not hypermetabolic      1/76/1607 PET scan    Hypermetabolic lesion along the L3 vertebral body and left posterior elements, max SUV 7.8 (Deauville 5). Hypermetabolic lesion along the left inferior pubic ramus, max SUV 4.9 (Deauville 4).  IMPRESSION: Prevascular lymphadenopathy, reflecting nodal recurrence (Deauville 4). Hypermetabolic osseous metastases involving the L3 vertebral body/posterior elements and left inferior pubic ramus (Deauville 4-5). Hypermetabolism along the endometrium, new, possibly reactive/physiologic. Consider pelvic ultrasound and/or endometrial sampling as clinically  warranted.      11/04/2016 - 02/24/2017 Chemotherapy    She received Brentuximab      12/11/2016 PET scan    1. Mixed response to therapy within the skeleton. Lesions at L3 is decreased in size and metabolic activity. Residual activity remains above liver ( Deauville 4) 2. Increased activity in the LEFT sacrum with metabolic activity above liver activity ( Deauville 4 3. Decrease in size and metabolic activity of anterior mediastinal tissue consistent with resolution of thymic hyperplasia or resolution of lymphoma metabolic activity ( Deauville 2). 4. No new lymphadenopathy. Normal spleen and liver.      03/25/2017 PET scan    1. Increased metabolic activity in several normal sized retroperitoneal and right pelvic lymph nodes, Deauville 4. 2. Increase in size and metabolic activity of lesions in the L3 and L4 vertebral bodies and in the left sacrum, Deauville 5. 3. No recurrence of anterior mediastinal abnormal activity. 4. Other imaging findings of potential clinical significance: Chronic ethmoid and left maxillary sinusitis. Paraseptal emphysema at the lung apices. Right ovarian dermoid.      04/06/2017 -  Chemotherapy    The patient had Keytruda      06/09/2017 PET scan    1. Overall significant improvement with reduction in nodal activity. Several retroperitoneal nodes which were previously Deauville 4 are currently Deauville 3. The right common iliac lymph node remains at Deauville 4 with maximum SUV of 3.4, but is improved from prior SUV of 4.3. 2. The bony lesions show the greatest improvement, with the previously Deauville 5 hypermetabolic lesions currently no longer of higher metabolic activity than surrounding bone marrow, currently measuring at Deauville 3. 3. 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 4. No new lesions are identified. 5. Emphysema (ICD10-J43.9).       REVIEW OF SYSTEMS:   Constitutional: Denies fevers, chills or  abnormal weight loss Eyes: Denies blurriness of vision Ears, nose, mouth, throat, and face: Denies mucositis or sore throat Respiratory: Denies cough, dyspnea or wheezes Cardiovascular: Denies palpitation, chest discomfort or lower extremity swelling Gastrointestinal:  Denies nausea, heartburn or change in bowel habits Lymphatics: Denies new lymphadenopathy or easy bruising Neurological:Denies numbness, tingling or new weaknesses Behavioral/Psych: Mood is stable, no new changes  All other systems were reviewed with the patient and are negative.  I have reviewed the past medical history, past surgical history, social history and family history with the patient and they are unchanged from previous note.  ALLERGIES:  is allergic to temazepam.  MEDICATIONS:  Current Outpatient Medications  Medication Sig Dispense Refill  . amoxicillin-clavulanate (AUGMENTIN) 875-125 MG tablet Take 1 tablet by mouth every 12 (twelve) hours. 20 tablet 0  . atazanavir (REYATAZ) 300 MG capsule Take 1 capsule (300 mg total) by mouth daily with breakfast. Take with Norvir. 30 capsule 5  . cholecalciferol (VITAMIN D) 1000 units tablet Take 1 tablet (1,000 Units total) by mouth daily. (Patient not taking: Reported  on 08/05/2017) 30 tablet 9  . emtricitabine-tenofovir (TRUVADA) 200-300 MG tablet Take 1 tablet by mouth daily. 30 tablet 5  . ibuprofen (ADVIL,MOTRIN) 600 MG tablet Take 1 tablet (600 mg total) by mouth every 6 (six) hours as needed. 30 tablet 0  . lidocaine-prilocaine (EMLA) cream Apply 1 application topically as needed. 30 g 11  . metroNIDAZOLE (METROGEL) 1 % gel Apply topically 2 (two) times daily. 45 g 0  . ondansetron (ZOFRAN ODT) 4 MG disintegrating tablet Take 1 tablet (4 mg total) by mouth every 8 (eight) hours as needed for nausea. 6 tablet 0  . Polyvinyl Alcohol-Povidone (CLEAR EYES ALL SEASONS OP) Place 1 drop into both eyes daily.    . ritonavir (NORVIR) 100 MG TABS tablet Take 1 tablet by mouth  every morning with breakfast. Take with Reyataz. 30 tablet 5  . traMADol (ULTRAM) 50 MG tablet Take 2 tablets (100 mg total) by mouth every 6 (six) hours as needed for moderate pain. 90 tablet 0  . triamcinolone cream (KENALOG) 0.1 % Apply 1 application topically 2 (two) times daily. (Patient taking differently: Apply 1 application topically 2 (two) times daily as needed (skin irritation). ) 30 g 0   No current facility-administered medications for this visit.    Facility-Administered Medications Ordered in Other Visits  Medication Dose Route Frequency Provider Last Rate Last Dose  . heparin lock flush 100 unit/mL  500 Units Intracatheter Once PRN Alvy Bimler, Meeya Goldin, MD      . sodium chloride flush (NS) 0.9 % injection 10 mL  10 mL Intracatheter PRN Alvy Bimler, Jissel Slavens, MD        PHYSICAL EXAMINATION: ECOG PERFORMANCE STATUS: 1 - Symptomatic but completely ambulatory  Vitals:   08/12/17 0925  BP: 127/76  Pulse: 68  Resp: 18  Temp: 98.5 F (36.9 C)  SpO2: 100%   Filed Weights   08/12/17 0925  Weight: 127 lb (57.6 kg)    GENERAL:alert, no distress and comfortable SKIN: Noted mild acne on the face EYES: normal, Conjunctiva are pink and non-injected, sclera clear OROPHARYNX:no exudate, no erythema and lips, buccal mucosa, and tongue normal  NECK: supple, thyroid normal size, non-tender, without nodularity LYMPH:  no palpable lymphadenopathy in the cervical, axillary or inguinal LUNGS: clear to auscultation and percussion with normal breathing effort HEART: regular rate & rhythm and no murmurs and no lower extremity edema ABDOMEN:abdomen soft, non-tender and normal bowel sounds Musculoskeletal:no cyanosis of digits and no clubbing  NEURO: alert & oriented x 3 with fluent speech, no focal motor/sensory deficits  LABORATORY DATA:  I have reviewed the data as listed    Component Value Date/Time   NA 140 08/12/2017 0940   NA 140 04/29/2017 0745   K 3.9 08/12/2017 0940   K 3.3 (L) 04/29/2017  0745   CL 107 08/12/2017 0940   CO2 25 08/12/2017 0940   CO2 23 04/29/2017 0745   GLUCOSE 78 08/12/2017 0940   GLUCOSE 83 04/29/2017 0745   BUN 10 08/12/2017 0940   BUN 10.7 04/29/2017 0745   CREATININE 0.85 08/12/2017 0940   CREATININE 1.2 (H) 04/29/2017 0745   CALCIUM 9.7 08/12/2017 0940   CALCIUM 9.8 04/29/2017 0745   PROT 7.1 08/12/2017 0940   PROT 7.6 04/29/2017 0745   ALBUMIN 3.9 08/12/2017 0940   ALBUMIN 4.0 04/29/2017 0745   AST 15 08/12/2017 0940   AST 19 04/29/2017 0745   ALT 24 08/12/2017 0940   ALT 19 04/29/2017 0745   ALKPHOS 60 08/12/2017 0940  ALKPHOS 81 04/29/2017 0745   BILITOT 1.0 08/12/2017 0940   BILITOT 1.31 (H) 04/29/2017 0745   GFRNONAA >60 08/12/2017 0940   GFRNONAA 83 02/20/2016 0908   GFRAA >60 08/12/2017 0940   GFRAA >89 02/20/2016 0908    No results found for: SPEP, UPEP  Lab Results  Component Value Date   WBC 8.9 08/12/2017   NEUTROABS 5.8 08/12/2017   HGB 14.2 08/12/2017   HCT 40.8 08/12/2017   MCV 104.6 (H) 08/12/2017   PLT 234 08/12/2017      Chemistry      Component Value Date/Time   NA 140 08/12/2017 0940   NA 140 04/29/2017 0745   K 3.9 08/12/2017 0940   K 3.3 (L) 04/29/2017 0745   CL 107 08/12/2017 0940   CO2 25 08/12/2017 0940   CO2 23 04/29/2017 0745   BUN 10 08/12/2017 0940   BUN 10.7 04/29/2017 0745   CREATININE 0.85 08/12/2017 0940   CREATININE 1.2 (H) 04/29/2017 0745      Component Value Date/Time   CALCIUM 9.7 08/12/2017 0940   CALCIUM 9.8 04/29/2017 0745   ALKPHOS 60 08/12/2017 0940   ALKPHOS 81 04/29/2017 0745   AST 15 08/12/2017 0940   AST 19 04/29/2017 0745   ALT 24 08/12/2017 0940   ALT 19 04/29/2017 0745   BILITOT 1.0 08/12/2017 0940   BILITOT 1.31 (H) 04/29/2017 0745       RADIOGRAPHIC STUDIES: I have personally reviewed the radiological images as listed and agreed with the findings in the report. US Transvaginal Non-ob  Result Date: 08/05/2017 CLINICAL DATA:  32 y/o F; acute bilateral  pelvic pain. Abnormal CT. EXAM: TRANSABDOMINAL AND TRANSVAGINAL ULTRASOUND OF PELVIS DOPPLER ULTRASOUND OF OVARIES TECHNIQUE: Both transabdominal and transvaginal ultrasound examinations of the pelvis were performed. Transabdominal technique was performed for global imaging of the pelvis including uterus, ovaries, adnexal regions, and pelvic cul-de-sac. It was necessary to proceed with endovaginal exam following the transabdominal exam to visualize the adnexa. Color and duplex Doppler ultrasound was utilized to evaluate blood flow to the ovaries. COMPARISON:  08/05/2017 and 07/02/2016 CT abdomen and pelvis. FINDINGS: Uterus Measurements: 4.5 x 1.9 x 3.2 cm. No fibroids or other mass visualized. Endometrium Thickness: 4.5 mm.  No focal abnormality visualized. Right ovary Measurements: 3.1 x 2.8 x 2.5 cm. Predominantly hyperechoic lesion within the right ovary measuring 2.2 x 2.2 x 2.0 cm with fat attenuation on CT compatible with dermoid. Left ovary Measurements: 2.6 x 1.4 x 1.2 cm. Normal appearance/no adnexal mass. Several follicles the largest measuring 9 mm. Pulsed Doppler evaluation of both ovaries demonstrates normal low-resistance arterial and venous waveforms. Other findings Small volume of simple fluid in the pelvis, likely physiologic. IMPRESSION: 1. 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 Normal left ovary. 2. Questioned left adnexal lesion on CT not identified. Electronically Signed   By: Kristine Garbe M.D.   On: 08/05/2017 14:02   US Pelvis Complete  Result Date: 08/05/2017 CLINICAL DATA:  32 y/o F; acute bilateral pelvic pain. Abnormal CT. EXAM: TRANSABDOMINAL AND TRANSVAGINAL ULTRASOUND OF PELVIS DOPPLER ULTRASOUND OF OVARIES TECHNIQUE: Both transabdominal and transvaginal ultrasound examinations of the pelvis were performed. Transabdominal technique was performed for global imaging of the pelvis including uterus, ovaries, adnexal regions, and pelvic cul-de-sac. It was  necessary to proceed with endovaginal exam following the transabdominal exam to visualize the adnexa. Color and duplex Doppler ultrasound was utilized to evaluate blood flow to the ovaries. COMPARISON:  08/05/2017 and 07/02/2016  CT abdomen and pelvis. FINDINGS: Uterus Measurements: 4.5 x 1.9 x 3.2 cm. No fibroids or other mass visualized. Endometrium Thickness: 4.5 mm.  No focal abnormality visualized. Right ovary Measurements: 3.1 x 2.8 x 2.5 cm. Predominantly hyperechoic lesion within the right ovary measuring 2.2 x 2.2 x 2.0 cm with fat attenuation on CT compatible with dermoid. Left ovary Measurements: 2.6 x 1.4 x 1.2 cm. Normal appearance/no adnexal mass. Several follicles the largest measuring 9 mm. Pulsed Doppler evaluation of both ovaries demonstrates normal low-resistance arterial and venous waveforms. Other findings Small volume of simple fluid in the pelvis, likely physiologic. IMPRESSION: 1. 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 Normal left ovary. 2. Questioned left adnexal lesion on CT not identified. Electronically Signed   By: Kristine Garbe M.D.   On: 08/05/2017 14:02   Ct Abdomen Pelvis W Contrast  Result Date: 08/05/2017 CLINICAL DATA:  Abdominal pain with nausea and vomiting. History of nodular sclerosing Hodgkin's lymphoma EXAM: CT ABDOMEN AND PELVIS WITH CONTRAST TECHNIQUE: Multidetector CT imaging of the abdomen and pelvis was performed using the standard protocol following bolus administration of intravenous contrast. CONTRAST:  150m ISOVUE-300 IOPAMIDOL (ISOVUE-300) INJECTION 61% COMPARISON:  PET-CT June 09, 2017 FINDINGS: Lower chest: There is bibasilar atelectatic change. There are foci of coronary artery calcification. There is a small hiatal hernia with mild fluid in the distal esophagus. Hepatobiliary: There is a degree of hepatic steatosis. No focal liver lesions are evident on this study. Gallbladder wall is not appreciably thickened. There is no  biliary duct dilatation. Pancreas: No pancreatic mass or inflammatory focus. Spleen: Spleen is normal in size and contour. No splenic lesions are evident. Adrenals/Urinary Tract: Adrenals bilaterally appear unremarkable. Kidneys bilaterally show no evident mass or hydronephrosis on either side. No evident renal or ureteral calculus on either side. Urinary bladder is midline with wall thickness within normal limits. Stomach/Bowel: There is no appreciable bowel wall or mesenteric thickening. No evident bowel obstruction. No free air or portal venous air. Vascular/Lymphatic: No abdominal aortic aneurysm. No vascular lesions are evident. By size criteria, there is currently no appreciable adenopathy in the abdomen or pelvis. There are scattered subcentimeter mesenteric lymph nodes, similar to recent PET study. Reproductive: Uterus is anteverted. There is a left adnexal mass measuring 2.9 x 2.8 cm which has a somewhat complex appearance and mildly thickened wall. This lesion is not a simple cyst. In the right adnexa, there is a mildly complex mass containing fat, a likely dermoid measuring 3.6 x 2.4 cm. This mass appear similar to recent PET study. Other: There is no periappendiceal inflammatory focus. No abscess or ascites evident in the abdomen or pelvis. Musculoskeletal: No blastic or lytic bone lesions are evident by CT. No intramuscular or abdominal wall lesion evident. IMPRESSION: 1. Somewhat complex left adnexal lesion, potentially hemorrhagic cyst measuring 2.9 x 2.8 cm. The possibility of torsion in the left ovary must be considered. Advise correlation with pelvic ultrasound including Doppler assessment to further evaluate. 2.  Right ovarian dermoid, unchanged from recent PET-CT examination. 3. No adenopathy by size criteria evident. Appearance of subcentimeter mesenteric lymph nodes is stable compared to recent study. 4. No bone lesions appreciable by CT. Areas of abnormal radiotracer uptake in the mid lumbar  spine noted on recent PET-CT examination. No destruction 2 or lytic change noted in the lumbar vertebrae currently there. 5. No bowel obstruction. No abscess. No periappendiceal region inflammation. 6.  Small hiatal hernia with fluid in distal esophagus. 7.  Foci of coronary artery calcification, advanced for age. 8.  Spleen normal in size and contour. 9. Small hiatal hernia with mild fluid in the distal esophagus, likely indicative of a degree of reflux. Electronically Signed   By: Lowella Grip III M.D.   On: 08/05/2017 11:31   [place] Art/ven Flow Abd Pelv Doppler  Result Date: 08/05/2017 CLINICAL DATA:  32 y/o F; acute bilateral pelvic pain. Abnormal CT. EXAM: TRANSABDOMINAL AND TRANSVAGINAL ULTRASOUND OF PELVIS DOPPLER ULTRASOUND OF OVARIES TECHNIQUE: Both transabdominal and transvaginal ultrasound examinations of the pelvis were performed. Transabdominal technique was performed for global imaging of the pelvis including uterus, ovaries, adnexal regions, and pelvic cul-de-sac. It was necessary to proceed with endovaginal exam following the transabdominal exam to visualize the adnexa. Color and duplex Doppler ultrasound was utilized to evaluate blood flow to the ovaries. COMPARISON:  08/05/2017 and 07/02/2016 CT abdomen and pelvis. FINDINGS: Uterus Measurements: 4.5 x 1.9 x 3.2 cm. No fibroids or other mass visualized. Endometrium Thickness: 4.5 mm.  No focal abnormality visualized. Right ovary Measurements: 3.1 x 2.8 x 2.5 cm. Predominantly hyperechoic lesion within the right ovary measuring 2.2 x 2.2 x 2.0 cm with fat attenuation on CT compatible with dermoid. Left ovary Measurements: 2.6 x 1.4 x 1.2 cm. Normal appearance/no adnexal mass. Several follicles the largest measuring 9 mm. Pulsed Doppler evaluation of both ovaries demonstrates normal low-resistance arterial and venous waveforms. Other findings Small volume of simple fluid in the pelvis, likely physiologic. IMPRESSION: 1. Right ovary dermoid  measuring up to 2.2 cm is stable. Normal left ovary. 2. Questioned left adnexal lesion on CT not identified. Electronically Signed   By: Kristine Garbe M.D.   On: 08/05/2017 14:02    All questions were answered. The patient knows to call the clinic with any problems, questions or concerns. No barriers to learning was detected.  I spent 15 minutes counseling the patient face to face. The total time spent in the appointment was 20 minutes and more than 50% was on counseling and review of test results  Heath Lark, MD 08/12/2017 11:20 AM

## 2017-08-12 NOTE — Assessment & Plan Note (Addendum)
The weight gain is likely due to antiestrogen treatment. TSH level is within normal limits

## 2017-08-20 ENCOUNTER — Other Ambulatory Visit: Payer: Self-pay

## 2017-08-20 ENCOUNTER — Telehealth: Payer: Self-pay

## 2017-08-20 ENCOUNTER — Telehealth: Payer: Self-pay | Admitting: *Deleted

## 2017-08-20 DIAGNOSIS — B20 Human immunodeficiency virus [HIV] disease: Secondary | ICD-10-CM

## 2017-08-20 MED ORDER — TRAMADOL HCL 50 MG PO TABS: 100.0000 mg | ORAL_TABLET | Freq: Four times a day (QID) | ORAL | 0 refills | Status: DC | PRN

## 2017-08-20 NOTE — Telephone Encounter (Signed)
Called pt and informed her of refill on tramadol called into her pharmacy.  Cyndia Bent RN

## 2017-08-20 NOTE — Telephone Encounter (Signed)
3:03 pm   "I called pharmacy on [address] they do not have the refill order yet.  Call me [phone]." 1:29 pm  "Please call.  [facility] says they have not received an order."

## 2017-08-20 NOTE — Telephone Encounter (Signed)
This nurse left messages for today's provider nurse with patient request for Tramadol refill request to be called to Pharmacy per [patient request.  "I am pout of pills."

## 2017-08-20 NOTE — Telephone Encounter (Signed)
Called patients pharmacy and they are not able to refill her tramadol. Her last refill was 04/18. It is too soon for a refill. LVM for patient and informed her of this as well.  Cyndia Bent RN

## 2017-08-20 NOTE — Telephone Encounter (Signed)
Calls received from [facility], no need for triage or further information provided.  "I need to talk with Dr. Alvy Bimler nurse."  Calls transferred as requested.

## 2017-08-23 ENCOUNTER — Other Ambulatory Visit: Payer: Self-pay | Admitting: *Deleted

## 2017-08-23 DIAGNOSIS — B20 Human immunodeficiency virus [HIV] disease: Secondary | ICD-10-CM

## 2017-08-23 MED ORDER — TRAMADOL HCL 50 MG PO TABS: 100.0000 mg | ORAL_TABLET | Freq: Four times a day (QID) | ORAL | 0 refills | Status: DC | PRN

## 2017-08-24 ENCOUNTER — Other Ambulatory Visit: Payer: Self-pay

## 2017-09-02 ENCOUNTER — Inpatient Hospital Stay: Payer: 59

## 2017-09-02 ENCOUNTER — Inpatient Hospital Stay (HOSPITAL_BASED_OUTPATIENT_CLINIC_OR_DEPARTMENT_OTHER): Payer: 59 | Admitting: Hematology and Oncology

## 2017-09-02 ENCOUNTER — Inpatient Hospital Stay: Payer: 59 | Attending: Hematology and Oncology

## 2017-09-02 ENCOUNTER — Encounter: Payer: Self-pay | Admitting: Hematology and Oncology

## 2017-09-02 DIAGNOSIS — C8118 Nodular sclerosis classical Hodgkin lymphoma, lymph nodes of multiple sites: Secondary | ICD-10-CM | POA: Diagnosis present

## 2017-09-02 DIAGNOSIS — M25531 Pain in right wrist: Secondary | ICD-10-CM | POA: Diagnosis not present

## 2017-09-02 DIAGNOSIS — C8198 Hodgkin lymphoma, unspecified, lymph nodes of multiple sites: Secondary | ICD-10-CM

## 2017-09-02 DIAGNOSIS — G8929 Other chronic pain: Secondary | ICD-10-CM | POA: Diagnosis not present

## 2017-09-02 DIAGNOSIS — B20 Human immunodeficiency virus [HIV] disease: Secondary | ICD-10-CM

## 2017-09-02 DIAGNOSIS — Z5112 Encounter for antineoplastic immunotherapy: Secondary | ICD-10-CM | POA: Insufficient documentation

## 2017-09-02 DIAGNOSIS — Z95828 Presence of other vascular implants and grafts: Secondary | ICD-10-CM

## 2017-09-02 LAB — COMPREHENSIVE METABOLIC PANEL
ALT: 19 U/L (ref 0–55)
ANION GAP: 8 (ref 3–11)
AST: 18 U/L (ref 5–34)
Albumin: 4 g/dL (ref 3.5–5.0)
Alkaline Phosphatase: 77 U/L (ref 40–150)
BUN: 9 mg/dL (ref 7–26)
CALCIUM: 9.6 mg/dL (ref 8.4–10.4)
CO2: 24 mmol/L (ref 22–29)
Chloride: 107 mmol/L (ref 98–109)
Creatinine, Ser: 0.96 mg/dL (ref 0.60–1.10)
GFR calc non Af Amer: >60 mL/min (ref >60)
Glucose, Bld: 88 mg/dL (ref 70–140)
Potassium: 3.3 mmol/L — ABNORMAL LOW (ref 3.5–5.1)
SODIUM: 139 mmol/L (ref 136–145)
Total Bilirubin: 0.5 mg/dL (ref 0.2–1.2)
Total Protein: 7.2 g/dL (ref 6.4–8.3)

## 2017-09-02 LAB — CBC WITH DIFFERENTIAL/PLATELET
Basophils Absolute: 0 10*3/uL (ref 0.0–0.1)
Basophils Relative: 0 %
EOS PCT: 1 %
Eosinophils Absolute: 0.1 10*3/uL (ref 0.0–0.5)
HCT: 40 % (ref 34.8–46.6)
HEMOGLOBIN: 14.2 g/dL (ref 11.6–15.9)
LYMPHS ABS: 1.9 10*3/uL (ref 0.9–3.3)
Lymphocytes Relative: 30 %
MCH: 36.7 pg — AB (ref 25.1–34.0)
MCHC: 35.5 g/dL (ref 31.5–36.0)
MCV: 103.4 fL — ABNORMAL HIGH (ref 79.5–101.0)
MONO ABS: 0.4 10*3/uL (ref 0.1–0.9)
MONOS PCT: 6 %
Neutro Abs: 4.1 10*3/uL (ref 1.5–6.5)
Neutrophils Relative %: 63 %
PLATELETS: 252 10*3/uL (ref 145–400)
RBC: 3.87 MIL/uL (ref 3.70–5.45)
RDW: 15.7 % — AB (ref 11.2–14.5)
WBC: 6.5 10*3/uL (ref 3.9–10.3)

## 2017-09-02 LAB — PREGNANCY, URINE: Preg Test, Ur: NEGATIVE

## 2017-09-02 MED ORDER — SODIUM CHLORIDE 0.9% FLUSH
10.0000 mL | INTRAVENOUS | Status: DC | PRN
  Administered 2017-09-02: 10 mL via INTRAVENOUS
  Filled 2017-09-02: qty 10

## 2017-09-02 MED ORDER — SODIUM CHLORIDE 0.9 % IV SOLN
Freq: Once | INTRAVENOUS | Status: AC
  Administered 2017-09-02: 09:00:00 via INTRAVENOUS

## 2017-09-02 MED ORDER — HEPARIN SOD (PORK) LOCK FLUSH 100 UNIT/ML IV SOLN
500.0000 [IU] | Freq: Once | INTRAVENOUS | Status: AC | PRN
  Administered 2017-09-02: 500 [IU]
  Filled 2017-09-02: qty 5

## 2017-09-02 MED ORDER — SODIUM CHLORIDE 0.9% FLUSH
10.0000 mL | INTRAVENOUS | Status: DC | PRN
  Administered 2017-09-02: 10 mL
  Filled 2017-09-02: qty 10

## 2017-09-02 MED ORDER — TRAMADOL HCL 50 MG PO TABS: 100.0000 mg | ORAL_TABLET | Freq: Four times a day (QID) | ORAL | 0 refills | Status: DC | PRN

## 2017-09-02 MED ORDER — SODIUM CHLORIDE 0.9 % IV SOLN
200.0000 mg | Freq: Once | INTRAVENOUS | Status: AC
  Administered 2017-09-02: 200 mg via INTRAVENOUS
  Filled 2017-09-02: qty 8

## 2017-09-02 MED FILL — traMADol HCL 50 MG TABS: 50 | 12 days supply | Qty: 90 | Fill #0

## 2017-09-02 NOTE — Assessment & Plan Note (Signed)
She will continue anti-retroviral treatment as directed

## 2017-09-02 NOTE — Progress Notes (Signed)
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 I recommend vitamin D supplement. I refilled her prescription tramadol today.   No orders of the defined types were placed in this encounter.   INTERVAL HISTORY: Please see below for problem oriented charting. 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      05/07/2014 Imaging    CT scan of the chest show right thoracic inlet lymphadenopathy      06/07/2014 Procedure     She underwent ultrasound-guided core biopsy of the neck lymph node      06/07/2014 Pathology Results    Accession: [id number] biopsy confirmed diagnosis of Hodgkin lymphoma.      06/15/2014 Imaging    Echocardiogram showed preserved ejection fraction      07/09/2014 - 07/12/2014 Hospital Admission    She was admitted to the hospital for severe anemia.      07/27/2014 Procedure    She had placement of port      07/31/2014 - 09/11/2014 Chemotherapy    She received dose adjusted chemotherapy due to abnormal liver function tests and severe anemia. Treatment was delayed due to noncompliance  and subsequently stopped because the patient failed to keep appointments      01/11/2015 Imaging    Repeat PET CT scan showed response to treatment      01/28/2015 - 06/18/2015 Chemotherapy    ABVD was restarted with full dose.      02/08/2015 - 02/10/2015 Hospital Admission    The patient was admitted to the hospital due to pancytopenia and profuse diarrhea. Cultures were negative. She was placed on ciprofloxacin.      02/11/2015 Adverse Reaction    Treatment was placed on hold due to recent infection.      04/11/2015 Imaging    PET CT scan showed near complete response. Incidental finding of an abnormal bone lesion, indeterminate. She is not symptomatic. Recommendation from [facility] to observe      07/11/2015 Imaging    PET CT scan showed abnormal new bone lesions, suggestive of possible disease progression      07/23/2015 Bone Marrow Biopsy    She underwent bone  biopsy      07/23/2015 Pathology Results    Accession: [id number]  biopsy was negative for cancer      11/21/2015 Surgery    She had surgery for ectopic pregnancy      01/29/2016 Imaging    Ct chest, abdomen and pelvis showed pelvic and retroperitoneal lymphadenopathy, as above, concerning for residual disease. There is also a mildly enlarged posterior mediastinal lymph node measuring 11 mm adjacent to the distal descending thoracic  aorta. This may represent an additional focus of disease, but is the only finding of concern in the thorax on today's examination. Sclerosis in the right ilium at site of previously noted metabolically active lesion, grossly unchanged. No other definite osseous lesions are identified on today's examination. Spleen is normal in size and appearance.      02/14/2016 PET scan    Interval disease worsening with new foci of hypermetabolic activity in multiple retroperitoneal and pelvic lymph nodes as well as AP window and left hilar lymph nodes. (Deauville 5). There is also overall worsening of the osseous disease.      05/11/2016 Pathology Results    Diagnosis Lymph node, needle/core biopsy, Left para-aortic retroperitoneal - CLASSICAL HODGKIN LYMPHOMA. - SEE ONCOLOGY TABLE. Microscopic Comment LYMPHOMA Histologic type: Classical Hodgkin lymphoma. Grade (if applicable): N/A Flow cytometry: Not done. Immunohistochemical stains: CD15, CD20, CD3, LCA, PAX-5, CD30 with appropriate controls. Touch preps/imprints: Not performed. Comments: The sections show small needle core biopsy fragments displaying a polymorphous cellular proliferation of small lymphocytes, plasma cells, eosinophils, and large atypical mononuclear and multilobated lymphoid cells with features of Reed-Sternberg cells and variants. This is associated with patchy areas of fibrosis. Immunohistochemical stains were performed and show that the large atypical lymphoid cells are positive for CD30, CD15 and PAX-5 and negative for LCA, CD20, CD3. The small lymphoid cells in the background show a mixture of T and B cells with predominance of T cells. The overall morphologic and histologic features are consistent with classical Hodgkin lymphoma. Further subtyping is challenging in limited small biopsy fragments but the patchy fibrosis suggests nodular sclerosis type.       05/11/2016 Procedure    She underwent CT guided biopsy of retroperitoneal  lymph node      05/26/2016 Procedure    Successful placement of a right IJ approach Power Port with ultrasound and fluoroscopic guidance. The catheter is ready for use.      06/02/2016 PET scan    Mixed response to chemotherapy with some lymph nodes decreased in metabolic activity and some lymph nodes increase metabolic activity. Lymph node stations including mediastinum, periaortic retroperitoneum, and obturator node stations. Activity is remains relatively intense Deauville 4 2. LEFT infrahilar nodule / lymph node with intense metabolic activity decreased from prior. ( [place]auville 4 ). 3. 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 Deauville 5      06/03/2016 - 06/05/2016 Hospital Admission    She was admitted to the hospital for cycle 1 of ICE chemotherapy      06/24/2016 - 06/26/2016 Hospital Admission    She received cycle 2 of ICE chemo      07/07/2016 PET scan    Resolution of prior hypermetabolic adenopathy and resolution of prior osseous foci of hypermetabolic activity compatible with essentially complete response to therapy (Deauville 1). 2. Generalized reduced activity in the L4 vertebral body and the type of finding which would typically reflect prior radiation therapy. 3. Stable septated fatty right pelvic lesion, possibly a dermoid or lipoma,  not hypermetabolic      10/18/7626 PET scan    Hypermetabolic lesion along the L3 vertebral body and left posterior elements, max SUV 7.8 (Deauville 5). Hypermetabolic lesion along the left inferior pubic ramus, max SUV 4.9 (Deauville 4).  IMPRESSION: Prevascular lymphadenopathy, reflecting nodal recurrence (Deauville 4). Hypermetabolic osseous metastases involving the L3 vertebral body/posterior elements and left inferior pubic ramus (Deauville 4-5). Hypermetabolism along the endometrium, new, possibly reactive/physiologic. Consider pelvic ultrasound and/or endometrial sampling as clinically warranted.      11/04/2016  - 02/24/2017 Chemotherapy    She received Brentuximab      12/11/2016 PET scan    1. Mixed response to therapy within the skeleton. Lesions at L3 is decreased in size and metabolic activity. Residual activity remains above liver ( Deauville 4) 2. Increased activity in the LEFT sacrum with metabolic activity above liver activity ( [place]auville 4 3. Decrease in size and metabolic activity of anterior mediastinal tissue consistent with resolution of thymic hyperplasia or resolution of lymphoma metabolic activity ( Deauville 2). 4. No new lymphadenopathy. Normal spleen and liver.      03/25/2017 PET scan    1. Increased metabolic activity in several normal sized retroperitoneal and right pelvic lymph nodes, Deauville 4. 2. Increase in size and metabolic activity of lesions in the L3 and L4 vertebral bodies and in the left sacrum, Deauville 5. 3. No recurrence of anterior mediastinal abnormal activity. 4. Other imaging findings of potential clinical significance: Chronic ethmoid and left maxillary sinusitis. Paraseptal emphysema at the lung apices. Right ovarian dermoid.      04/06/2017 -  Chemotherapy    The patient had Keytruda      06/09/2017 PET scan    1. Overall significant improvement with reduction in nodal activity. Several retroperitoneal nodes which were previously Deauville 4 are currently [place] 3. The right common iliac lymph node remains at [place] 4 with maximum SUV of 3.4, but is improved from prior SUV of 4.3. 2. The bony lesions show the greatest improvement, with the previously Deauville 5 hypermetabolic lesions currently no longer of higher metabolic activity than surrounding bone marrow, currently measuring at [place] 3. 3. 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 4. No new lesions are identified. 5. Emphysema (ICD10-J43.9).      08/05/2017 Imaging    1. Somewhat complex left adnexal lesion, potentially hemorrhagic cyst  measuring 2.9 x 2.8 cm. The possibility of torsion in the left ovary must be considered. Advise correlation with pelvic ultrasound including Doppler assessment to further evaluate.  2.  Right ovarian dermoid, unchanged from recent PET-CT examination.  3. No adenopathy by size criteria evident. Appearance of subcentimeter mesenteric lymph nodes is stable compared to recent study.  4. No bone lesions appreciable by CT. Areas of abnormal radiotracer uptake in the mid lumbar spine noted on recent PET-CT examination. No destruction 2 or lytic change noted in the lumbar vertebrae currently there.  5. No bowel obstruction. No abscess. No periappendiceal region inflammation.  6.  Small hiatal hernia with fluid in distal esophagus.  7.  Foci of coronary artery calcification, advanced for age.  8.  Spleen normal in size and contour.  9. Small hiatal hernia with mild fluid in the distal esophagus, likely indicative of a degree of reflux.       REVIEW OF SYSTEMS:   Constitutional: Denies fevers, chills or abnormal weight loss Eyes: Denies blurriness of vision Ears, nose, mouth, throat, and face: Denies mucositis or sore throat Respiratory:  Denies cough, dyspnea or wheezes Cardiovascular: Denies palpitation, chest discomfort or lower extremity swelling Gastrointestinal:  Denies nausea, heartburn or change in bowel habits Skin: Denies abnormal skin rashes Lymphatics: Denies new lymphadenopathy or easy bruising Neurological:Denies numbness, tingling or new weaknesses Behavioral/Psych: Mood is stable, no new changes  All other systems were reviewed with the patient and are negative.  I have reviewed the past medical history, past surgical history, social history and family history with the patient and they are unchanged from previous note.  ALLERGIES:  is allergic to temazepam.  MEDICATIONS:  Current Outpatient Medications  Medication Sig Dispense Refill  . amoxicillin-clavulanate  (AUGMENTIN) 875-125 MG tablet Take 1 tablet by mouth every 12 (twelve) hours. 20 tablet 0  . atazanavir (REYATAZ) 300 MG capsule Take 1 capsule (300 mg total) by mouth daily with breakfast. Take with Norvir. 30 capsule 5  . cholecalciferol (VITAMIN D) 1000 units tablet Take 1 tablet (1,000 Units total) by mouth daily. (Patient not taking: Reported on 08/05/2017) 30 tablet 9  . emtricitabine-tenofovir (TRUVADA) 200-300 MG tablet Take 1 tablet by mouth daily. 30 tablet 5  . ibuprofen (ADVIL,MOTRIN) 600 MG tablet Take 1 tablet (600 mg total) by mouth every 6 (six) hours as needed. 30 tablet 0  . lidocaine-prilocaine (EMLA) cream Apply 1 application topically as needed. 30 g 11  . metroNIDAZOLE (METROGEL) 1 % gel Apply topically 2 (two) times daily. 45 g 0  . ondansetron (ZOFRAN ODT) 4 MG disintegrating tablet Take 1 tablet (4 mg total) by mouth every 8 (eight) hours as needed for nausea. 6 tablet 0  . Polyvinyl Alcohol-Povidone (CLEAR EYES ALL SEASONS OP) Place 1 drop into both eyes daily.    . ritonavir (NORVIR) 100 MG TABS tablet Take 1 tablet by mouth every morning with breakfast. Take with Reyataz. 30 tablet 5  . traMADol (ULTRAM) 50 MG tablet Take 2 tablets (100 mg total) by mouth every 6 (six) hours as needed for moderate pain. 90 tablet 0  . triamcinolone cream (KENALOG) 0.1 % Apply 1 application topically 2 (two) times daily. (Patient taking differently: Apply 1 application topically 2 (two) times daily as needed (skin irritation). ) 30 g 0   No current facility-administered medications for this visit.    Facility-Administered Medications Ordered in Other Visits  Medication Dose Route Frequency Provider Last Rate Last Dose  . sodium chloride flush (NS) 0.9 % injection 10 mL  10 mL Intracatheter PRN Alvy Bimler, Kimyah Frein, MD   10 mL at 09/02/17 1020    PHYSICAL EXAMINATION: ECOG PERFORMANCE STATUS: 1 - Symptomatic but completely ambulatory  Vitals:   09/02/17 0825  BP: 118/74  Pulse: 72  Resp: 18   Temp: 99.1 F (37.3 C)  SpO2: 99%   Filed Weights   09/02/17 0825  Weight: 130 lb 6.4 oz (59.1 kg)    GENERAL:alert, no distress and comfortable SKIN: skin color, texture, turgor are normal, no rashes or significant lesions EYES: normal, Conjunctiva are pink and non-injected, sclera clear OROPHARYNX:no exudate, no erythema and lips, buccal mucosa, and tongue normal  NECK: supple, thyroid normal size, non-tender, without nodularity LYMPH:  no palpable lymphadenopathy in the cervical, axillary or inguinal LUNGS: clear to auscultation and percussion with normal breathing effort HEART: regular rate & rhythm and no murmurs and no lower extremity edema ABDOMEN:abdomen soft, non-tender and normal bowel sounds Musculoskeletal:no cyanosis of digits and no clubbing  NEURO: alert & oriented x 3 with fluent speech, no focal motor/sensory deficits  LABORATORY DATA:  I  have reviewed the data as listed    Component Value Date/Time   NA 139 09/02/2017 0743   NA 140 04/29/2017 0745   K 3.3 (L) 09/02/2017 0743   K 3.3 (L) 04/29/2017 0745   CL 107 09/02/2017 0743   CO2 24 09/02/2017 0743   CO2 23 04/29/2017 0745   GLUCOSE 88 09/02/2017 0743   GLUCOSE 83 04/29/2017 0745   BUN 9 09/02/2017 0743   BUN 10.7 04/29/2017 0745   CREATININE 0.96 09/02/2017 0743   CREATININE 1.2 (H) 04/29/2017 0745   CALCIUM 9.6 09/02/2017 0743   CALCIUM 9.8 04/29/2017 0745   PROT 7.2 09/02/2017 0743   PROT 7.6 04/29/2017 0745   ALBUMIN 4.0 09/02/2017 0743   ALBUMIN 4.0 04/29/2017 0745   AST 18 09/02/2017 0743   AST 19 04/29/2017 0745   ALT 19 09/02/2017 0743   ALT 19 04/29/2017 0745   ALKPHOS 77 09/02/2017 0743   ALKPHOS 81 04/29/2017 0745   BILITOT 0.5 09/02/2017 0743   BILITOT 1.31 (H) 04/29/2017 0745   GFRNONAA >60 09/02/2017 0743   GFRNONAA 83 02/20/2016 0908   GFRAA >60 09/02/2017 0743   GFRAA >89 02/20/2016 0908    No results found for: SPEP, UPEP  Lab Results  Component Value Date   WBC 6.5  09/02/2017   NEUTROABS 4.1 09/02/2017   HGB 14.2 09/02/2017   HCT 40.0 09/02/2017   MCV 103.4 (H) 09/02/2017   PLT 252 09/02/2017      Chemistry      Component Value Date/Time   NA 139 09/02/2017 0743   NA 140 04/29/2017 0745   K 3.3 (L) 09/02/2017 0743   K 3.3 (L) 04/29/2017 0745   CL 107 09/02/2017 0743   CO2 24 09/02/2017 0743   CO2 23 04/29/2017 0745   BUN 9 09/02/2017 0743   BUN 10.7 04/29/2017 0745   CREATININE 0.96 09/02/2017 0743   CREATININE 1.2 (H) 04/29/2017 0745      Component Value Date/Time   CALCIUM 9.6 09/02/2017 0743   CALCIUM 9.8 04/29/2017 0745   ALKPHOS 77 09/02/2017 0743   ALKPHOS 81 04/29/2017 0745   AST 18 09/02/2017 0743   AST 19 04/29/2017 0745   ALT 19 09/02/2017 0743   ALT 19 04/29/2017 0745   BILITOT 0.5 09/02/2017 0743   BILITOT 1.31 (H) 04/29/2017 0745       RADIOGRAPHIC STUDIES: I have personally reviewed the radiological images as listed and agreed with the findings in the report. US Transvaginal Non-ob  Result Date: 08/05/2017 CLINICAL DATA:  32 y/o F; acute bilateral pelvic pain. Abnormal CT. EXAM: TRANSABDOMINAL AND TRANSVAGINAL ULTRASOUND OF PELVIS DOPPLER ULTRASOUND OF OVARIES TECHNIQUE: Both transabdominal and transvaginal ultrasound examinations of the pelvis were performed. Transabdominal technique was performed for global imaging of the pelvis including uterus, ovaries, adnexal regions, and pelvic cul-de-sac. It was necessary to proceed with endovaginal exam following the transabdominal exam to visualize the adnexa. Color and duplex Doppler ultrasound was utilized to evaluate blood flow to the ovaries. COMPARISON:  08/05/2017 and 07/02/2016 CT abdomen and pelvis. FINDINGS: Uterus Measurements: 4.5 x 1.9 x 3.2 cm. No fibroids or other mass visualized. Endometrium Thickness: 4.5 mm.  No focal abnormality visualized. Right ovary Measurements: 3.1 x 2.8 x 2.5 cm. Predominantly hyperechoic lesion within the right ovary measuring 2.2 x 2.2  x 2.0 cm with fat attenuation on CT compatible with dermoid. Left ovary Measurements: 2.6 x 1.4 x 1.2 cm. Normal appearance/no adnexal mass. Several follicles the largest measuring  9 mm. Pulsed Doppler evaluation of both ovaries demonstrates normal low-resistance arterial and venous waveforms. Other findings Small volume of simple fluid in the pelvis, likely physiologic. IMPRESSION: 1. 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 Normal left ovary. 2. Questioned left adnexal lesion on CT not identified. Electronically Signed   By: Kristine Garbe M.D.   On: 08/05/2017 14:02   US Pelvis Complete  Result Date: 08/05/2017 CLINICAL DATA:  32 y/o F; acute bilateral pelvic pain. Abnormal CT. EXAM: TRANSABDOMINAL AND TRANSVAGINAL ULTRASOUND OF PELVIS DOPPLER ULTRASOUND OF OVARIES TECHNIQUE: Both transabdominal and transvaginal ultrasound examinations of the pelvis were performed. Transabdominal technique was performed for global imaging of the pelvis including uterus, ovaries, adnexal regions, and pelvic cul-de-sac. It was necessary to proceed with endovaginal exam following the transabdominal exam to visualize the adnexa. Color and duplex Doppler ultrasound was utilized to evaluate blood flow to the ovaries. COMPARISON:  08/05/2017 and 07/02/2016 CT abdomen and pelvis. FINDINGS: Uterus Measurements: 4.5 x 1.9 x 3.2 cm. No fibroids or other mass visualized. Endometrium Thickness: 4.5 mm.  No focal abnormality visualized. Right ovary Measurements: 3.1 x 2.8 x 2.5 cm. Predominantly hyperechoic lesion within the right ovary measuring 2.2 x 2.2 x 2.0 cm with fat attenuation on CT compatible with dermoid. Left ovary Measurements: 2.6 x 1.4 x 1.2 cm. Normal appearance/no adnexal mass. Several follicles the largest measuring 9 mm. Pulsed Doppler evaluation of both ovaries demonstrates normal low-resistance arterial and venous waveforms. Other findings Small volume of simple fluid in the pelvis, likely  physiologic. IMPRESSION: 1. 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 Normal left ovary. 2. Questioned left adnexal lesion on CT not identified. Electronically Signed   By: Kristine Garbe M.D.   On: 08/05/2017 14:02   Ct Abdomen Pelvis W Contrast  Result Date: 08/05/2017 CLINICAL DATA:  Abdominal pain with nausea and vomiting. History of nodular sclerosing Hodgkin's lymphoma EXAM: CT ABDOMEN AND PELVIS WITH CONTRAST TECHNIQUE: Multidetector CT imaging of the abdomen and pelvis was performed using the standard protocol following bolus administration of intravenous contrast. CONTRAST:  180m ISOVUE-300 IOPAMIDOL (ISOVUE-300) INJECTION 61% COMPARISON:  PET-CT June 09, 2017 FINDINGS: Lower chest: There is bibasilar atelectatic change. There are foci of coronary artery calcification. There is a small hiatal hernia with mild fluid in the distal esophagus. Hepatobiliary: There is a degree of hepatic steatosis. No focal liver lesions are evident on this study. Gallbladder wall is not appreciably thickened. There is no biliary duct dilatation. Pancreas: No pancreatic mass or inflammatory focus. Spleen: Spleen is normal in size and contour. No splenic lesions are evident. Adrenals/Urinary Tract: Adrenals bilaterally appear unremarkable. Kidneys bilaterally show no evident mass or hydronephrosis on either side. No evident renal or ureteral calculus on either side. Urinary bladder is midline with wall thickness within normal limits. Stomach/Bowel: There is no appreciable bowel wall or mesenteric thickening. No evident bowel obstruction. No free air or portal venous air. Vascular/Lymphatic: No abdominal aortic aneurysm. No vascular lesions are evident. By size criteria, there is currently no appreciable adenopathy in the abdomen or pelvis. There are scattered subcentimeter mesenteric lymph nodes, similar to recent PET study. Reproductive: Uterus is anteverted. There is a left adnexal mass measuring  2.9 x 2.8 cm which has a somewhat complex appearance and mildly thickened wall. This lesion is not a simple cyst. In the right adnexa, there is a mildly complex mass containing fat, a likely dermoid measuring 3.6 x 2.4 cm. This mass appear similar  to recent PET study. Other: There is no periappendiceal inflammatory focus. No abscess or ascites evident in the abdomen or pelvis. Musculoskeletal: No blastic or lytic bone lesions are evident by CT. No intramuscular or abdominal wall lesion evident. IMPRESSION: 1. Somewhat complex left adnexal lesion, potentially hemorrhagic cyst measuring 2.9 x 2.8 cm. The possibility of torsion in the left ovary must be considered. Advise correlation with pelvic ultrasound including Doppler assessment to further evaluate. 2.  Right ovarian dermoid, unchanged from recent PET-CT examination. 3. No adenopathy by size criteria evident. Appearance of subcentimeter mesenteric lymph nodes is stable compared to recent study. 4. No bone lesions appreciable by CT. Areas of abnormal radiotracer uptake in the mid lumbar spine noted on recent PET-CT examination. No destruction 2 or lytic change noted in the lumbar vertebrae currently there. 5. No bowel obstruction. No abscess. No periappendiceal region inflammation. 6.  Small hiatal hernia with fluid in distal esophagus. 7.  Foci of coronary artery calcification, advanced for age. 8.  Spleen normal in size and contour. 9. Small hiatal hernia with mild fluid in the distal esophagus, likely indicative of a degree of reflux. Electronically Signed   By: Lowella Grip III M.D.   On: 08/05/2017 11:31   [place] Art/ven Flow Abd Pelv Doppler  Result Date: 08/05/2017 CLINICAL DATA:  32 y/o F; acute bilateral pelvic pain. Abnormal CT. EXAM: TRANSABDOMINAL AND TRANSVAGINAL ULTRASOUND OF PELVIS DOPPLER ULTRASOUND OF OVARIES TECHNIQUE: Both transabdominal and transvaginal ultrasound examinations of the pelvis were performed. Transabdominal technique was  performed for global imaging of the pelvis including uterus, ovaries, adnexal regions, and pelvic cul-de-sac. It was necessary to proceed with endovaginal exam following the transabdominal exam to visualize the adnexa. Color and duplex Doppler ultrasound was utilized to evaluate blood flow to the ovaries. COMPARISON:  08/05/2017 and 07/02/2016 CT abdomen and pelvis. FINDINGS: Uterus Measurements: 4.5 x 1.9 x 3.2 cm. No fibroids or other mass visualized. Endometrium Thickness: 4.5 mm.  No focal abnormality visualized. Right ovary Measurements: 3.1 x 2.8 x 2.5 cm. Predominantly hyperechoic lesion within the right ovary measuring 2.2 x 2.2 x 2.0 cm with fat attenuation on CT compatible with dermoid. Left ovary Measurements: 2.6 x 1.4 x 1.2 cm. Normal appearance/no adnexal mass. Several follicles the largest measuring 9 mm. Pulsed Doppler evaluation of both ovaries demonstrates normal low-resistance arterial and venous waveforms. Other findings Small volume of simple fluid in the pelvis, likely physiologic. IMPRESSION: 1. 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 Normal left ovary. 2. Questioned left adnexal lesion on CT not identified. Electronically Signed   By: Kristine Garbe M.D.   On: 08/05/2017 14:02    All questions were answered. The patient knows to call the clinic with any problems, questions or concerns. No barriers to learning was detected.  I spent 15 minutes counseling the patient face to face. The total time spent in the appointment was 20 minutes and more than 50% was on counseling and review of test results  Heath Lark, MD 09/02/2017 10:49 AM

## 2017-09-02 NOTE — Patient Instructions (Addendum)
[facility] Discharge Instructions for Patients Receiving Chemotherapy  Today you received the following chemotherapy agents: Keytruda.  To help prevent nausea and vomiting after your treatment, we encourage you to take your nausea medication as directed.   If you develop nausea and vomiting that is not controlled by your nausea medication, call the clinic.   BELOW ARE SYMPTOMS THAT SHOULD BE REPORTED IMMEDIATELY:  *FEVER GREATER THAN 100.5 F  *CHILLS WITH OR WITHOUT FEVER  NAUSEA AND VOMITING THAT IS NOT CONTROLLED WITH YOUR NAUSEA MEDICATION  *UNUSUAL SHORTNESS OF BREATH  *UNUSUAL BRUISING OR BLEEDING  TENDERNESS IN MOUTH AND THROAT WITH OR WITHOUT PRESENCE OF ULCERS  *URINARY PROBLEMS  *BOWEL PROBLEMS  UNUSUAL RASH Items with * indicate a potential emergency and should be followed up as soon as possible.  Feel free to call the clinic should you have any questions or concerns. The clinic phone number is [phone].  Please show the CHEMO ALERT CARD at check-in to the Emergency Department and triage nurse.    Hypokalemia Hypokalemia means that the amount of potassium in the blood is lower than normal.Potassium is a chemical that helps regulate the amount of fluid in the body (electrolyte). It also stimulates muscle tightening (contraction) and helps nerves work properly.Normally, most of the body's potassium is inside of cells, and only a very small amount is in the blood. Because the amount in the blood is so small, minor changes to potassium levels in the blood can be life-threatening. What are the causes? This condition may be caused by:  Antibiotic medicine.  Diarrhea or vomiting. Taking too much of a medicine that helps you have a bowel movement (laxative) can cause diarrhea and lead to hypokalemia.  Chronic kidney disease (CKD).  Medicines that help the body get rid of excess fluid (diuretics).  Eating disorders, such as bulimia.  Low  magnesium levels in the body.  Sweating a lot.  What are the signs or symptoms? Symptoms of this condition include:  Weakness.  Constipation.  Fatigue.  Muscle cramps.  Mental confusion.  Skipped heartbeats or irregular heartbeat (palpitations).  Tingling or numbness.  How is this diagnosed? This condition is diagnosed with a blood test. How is this treated? Hypokalemia can be treated by taking potassium supplements by mouth or adjusting the medicines that you take. Treatment may also include eating more foods that contain a lot of potassium. If your potassium level is very low, you may need to get potassium through an IV tube in one of your veins and be monitored in the hospital. Follow these instructions at home:  Take over-the-counter and prescription medicines only as told by your health care provider. This includes vitamins and supplements.  Eat a healthy diet. A healthy diet includes fresh fruits and vegetables, whole grains, healthy fats, and lean proteins.  If instructed, eat more foods that contain a lot of potassium, such as: ? Nuts, such as peanuts and pistachios. ? Seeds, such as sunflower seeds and pumpkin seeds. ? Peas, lentils, and lima beans. ? Whole grain and bran cereals and breads. ? Fresh fruits and vegetables, such as apricots, avocado, bananas, cantaloupe, kiwi, oranges, tomatoes, asparagus, and potatoes. ? Orange juice. ? Tomato juice. ? Red meats. ? Yogurt.  Keep all follow-up visits as told by your health care provider. This is important. Contact a health care provider if:  You have weakness that gets worse.  You feel your heart pounding or racing.  You vomit.  You have diarrhea.    You have diabetes (diabetes mellitus) and you have trouble keeping your blood sugar (glucose) in your target range. Get help right away if:  You have chest pain.  You have shortness of breath.  You have vomiting or diarrhea that lasts for more than 2  days.  You faint. This information is not intended to replace advice given to you by your health care provider. Make sure you discuss any questions you have with your health care provider. Document Released: 04/13/2005 Document Revised: 11/30/2015 Document Reviewed: 11/30/2015 Elsevier Interactive Patient Education  2018 [employer]  

## 2017-09-02 NOTE — Progress Notes (Signed)
Notified Special educational needs teacher about pt potassium of 3.3 today, to inform Dr Alvy Bimler. No new orders.

## 2017-09-02 NOTE — Assessment & Plan Note (Signed)
She has chronic wrist pain and diffuse musculoskeletal pain of unknown etiology but could be due to her cancer Examination is benign. I recommend vitamin D supplement. I refilled her prescription tramadol today.

## 2017-09-02 NOTE — Assessment & Plan Note (Signed)
Recent imaging studies show no evidence of disease progression I recommend spacing out her treatment to once a month but keeping at the same dose She will continue Lupron injection to suppress ovarian function

## 2017-09-03 ENCOUNTER — Telehealth: Payer: Self-pay | Admitting: Hematology and Oncology

## 2017-09-03 NOTE — Telephone Encounter (Signed)
Mailed patient calendar of upcoming June and July appointments

## 2017-09-06 ENCOUNTER — Inpatient Hospital Stay: Payer: 59

## 2017-09-07 ENCOUNTER — Other Ambulatory Visit: Payer: Self-pay | Admitting: Internal Medicine

## 2017-09-07 ENCOUNTER — Ambulatory Visit (INDEPENDENT_AMBULATORY_CARE_PROVIDER_SITE_OTHER): Payer: 59 | Admitting: Internal Medicine

## 2017-09-07 ENCOUNTER — Encounter: Payer: Self-pay | Admitting: Internal Medicine

## 2017-09-07 ENCOUNTER — Other Ambulatory Visit (HOSPITAL_COMMUNITY)
Admission: RE | Admit: 2017-09-07 | Discharge: 2017-09-07 | Disposition: A | Discharge status: Home / Self Care | Payer: 59 | Source: Ambulatory Visit | Attending: Internal Medicine | Admitting: Internal Medicine

## 2017-09-07 VITALS — BP 99/62 | HR 69 | Temp 99.2°F | Ht 64.0 in | Wt 128.0 lb

## 2017-09-07 DIAGNOSIS — Z113 Encounter for screening for infections with a predominantly sexual mode of transmission: Secondary | ICD-10-CM | POA: Insufficient documentation

## 2017-09-07 DIAGNOSIS — B20 Human immunodeficiency virus [HIV] disease: Secondary | ICD-10-CM

## 2017-09-07 NOTE — Assessment & Plan Note (Signed)
Will screen today 

## 2017-09-07 NOTE — Assessment & Plan Note (Signed)
Labs today and rtc 4 months.

## 2017-09-07 NOTE — Progress Notes (Signed)
   Subjective:    Patient ID: [name], female    DOB: [birth year], 32 y.o.   MRN: [id number]  HPI Here for follow up of HIV Continues on Reyataz, norvir, Truvada and no missed doses.  She has been hesitant to change and with other mediations I have kept her on this regimen. No labs prior to today's visit.  Continues to see oncology, Dr. Alvy Bimler, for her  Hodgkin lymphoma and getting chemo once a month.  Continues to have weight gain.    Review of Systems  Constitutional: Negative for fatigue and fever.  Neurological: Negative for dizziness.       Objective:   Physical Exam  Constitutional: She appears well-developed and well-nourished. No distress.  HENT:  Mouth/Throat: No oropharyngeal exudate.  Eyes: No scleral icterus.  Cardiovascular: Normal rate, regular rhythm and normal heart sounds.  No murmur heard. Pulmonary/Chest: Effort normal and breath sounds normal. No respiratory distress.  Lymphadenopathy:    She has no cervical adenopathy.  Skin: No rash noted.          Assessment & Plan:

## 2017-09-08 LAB — URINE CYTOLOGY ANCILLARY ONLY
Chlamydia: NEGATIVE
NEISSERIA GONORRHEA: NEGATIVE

## 2017-09-08 LAB — RPR: RPR: NONREACTIVE

## 2017-09-08 LAB — T-HELPER CELL (CD4) - (RCID CLINIC ONLY)
CD4 % Helper T Cell: 38 % (ref 33–55)
CD4 T Cell Abs: 800 /uL (ref 400–2700)

## 2017-09-09 LAB — HIV-1 RNA QUANT-NO REFLEX-BLD
HIV 1 RNA QUANT: 64 {copies}/mL — AB
HIV-1 RNA QUANT, LOG: 1.81 {Log_copies}/mL — AB

## 2017-09-10 ENCOUNTER — Other Ambulatory Visit: Payer: Self-pay | Admitting: Hematology and Oncology

## 2017-09-10 ENCOUNTER — Other Ambulatory Visit: Payer: Self-pay

## 2017-09-10 ENCOUNTER — Telehealth: Payer: Self-pay

## 2017-09-10 NOTE — Telephone Encounter (Signed)
Per Dr Alvy Bimler:   It is not recommended to exceed total 8 per day: that should give her 11 days of supply I cannot refill until it has been 11 days since her last prescription. Call back next week  Outgoing call to pt, above information given per Dr Alvy Bimler.  Pt voiced understanding.  Asked her if has received antibiotics from dentist office since she mentioned it being a tooth infection.  Pt said yes, she completed them a while back.  Told her she might want to call the dentist office to see if antibiotics are needed since her appt is not until June 6th.  Pt agreed and said she was going to call dentist office.  Will check back on prescription for Tramadol on Monday.

## 2017-09-10 NOTE — Telephone Encounter (Signed)
It is not recommended to exceed total 8 per day: that should give her 11 days of supply I cannot refill until it has been 11 days since her last prescription. Call back next week

## 2017-09-10 NOTE — Telephone Encounter (Signed)
Returned pt's call regarding needing her Tramadol prescription a little early due to has a tooth infection.  Pt reports she had to use Tramadol a little more often due to tooth pain.  Asked her if she is going to see the dentist and she said she has appt on June 6th to have some teeth removed.  Will route note to Dr Alvy Bimler.

## 2017-09-13 ENCOUNTER — Telehealth: Payer: Self-pay

## 2017-09-13 ENCOUNTER — Other Ambulatory Visit: Payer: Self-pay | Admitting: Hematology and Oncology

## 2017-09-13 DIAGNOSIS — B20 Human immunodeficiency virus [HIV] disease: Secondary | ICD-10-CM

## 2017-09-13 MED ORDER — TRAMADOL HCL 50 MG PO TABS: 100.0000 mg | ORAL_TABLET | Freq: Four times a day (QID) | ORAL | 0 refills | Status: DC | PRN

## 2017-09-13 MED FILL — traMADol HCL 50 MG TABS: 50 | 12 days supply | Qty: 90 | Fill #0

## 2017-09-13 NOTE — Telephone Encounter (Signed)
Received prescription per Dr Alvy Bimler, outgoing call to pt, no answer, left VM that her prescription is available for pick up in cancer center.

## 2017-09-13 NOTE — Telephone Encounter (Signed)
Received VM from pt requesting a refill of her Tramadol.  Will route note to Dr Alvy Bimler.

## 2017-09-14 ENCOUNTER — Inpatient Hospital Stay: Payer: 59

## 2017-09-14 VITALS — BP 120/86 | HR 71 | Temp 98.3°F | Resp 18 | Wt 125.5 lb

## 2017-09-14 DIAGNOSIS — C8118 Nodular sclerosis classical Hodgkin lymphoma, lymph nodes of multiple sites: Secondary | ICD-10-CM

## 2017-09-14 DIAGNOSIS — Z5112 Encounter for antineoplastic immunotherapy: Secondary | ICD-10-CM | POA: Diagnosis not present

## 2017-09-14 DIAGNOSIS — Z95828 Presence of other vascular implants and grafts: Secondary | ICD-10-CM

## 2017-09-14 MED ORDER — LEUPROLIDE ACETATE (3 MONTH) 22.5 MG IM KIT
PACK | INTRAMUSCULAR | Status: AC
  Filled 2017-09-14: qty 22.5

## 2017-09-14 MED ORDER — LEUPROLIDE ACETATE 7.5 MG IM KIT
7.5000 mg | PACK | Freq: Once | INTRAMUSCULAR | Status: AC
  Administered 2017-09-14: 7.5 mg via INTRAMUSCULAR
  Filled 2017-09-14: qty 7.5

## 2017-09-14 NOTE — Patient Instructions (Signed)

## 2017-09-23 ENCOUNTER — Ambulatory Visit: Payer: Self-pay

## 2017-09-23 ENCOUNTER — Other Ambulatory Visit: Payer: Self-pay

## 2017-09-23 ENCOUNTER — Telehealth: Payer: Self-pay

## 2017-09-23 ENCOUNTER — Ambulatory Visit: Payer: Self-pay | Admitting: Hematology and Oncology

## 2017-09-23 DIAGNOSIS — B20 Human immunodeficiency virus [HIV] disease: Secondary | ICD-10-CM

## 2017-09-23 MED ORDER — TRAMADOL HCL 50 MG PO TABS: 100.0000 mg | ORAL_TABLET | Freq: Four times a day (QID) | ORAL | 0 refills | Status: DC | PRN

## 2017-09-23 NOTE — Telephone Encounter (Signed)
Called and given below message. Verbalized understanding. Scheduling message sent.

## 2017-09-23 NOTE — Telephone Encounter (Signed)
Received message that she needed a refill on her Tramadol. Called and told her Rx is ready for pick up.  She is having a tooth pulled at the dentist office on 6/6 early in the am. She is scheduled to have chemo later that same day. Is that okay to have them on the same day?

## 2017-09-23 NOTE — Telephone Encounter (Signed)
No recommended Please reschedule everything to 6/10

## 2017-09-25 ENCOUNTER — Other Ambulatory Visit: Payer: Self-pay | Admitting: Urgent Care

## 2017-09-27 ENCOUNTER — Telehealth: Payer: Self-pay | Admitting: Hematology and Oncology

## 2017-09-27 NOTE — Telephone Encounter (Signed)
Spoke to patient regarding upcoming appointments updates per 5/30 sch message.

## 2017-09-30 ENCOUNTER — Other Ambulatory Visit: Payer: Self-pay

## 2017-09-30 ENCOUNTER — Ambulatory Visit: Payer: Self-pay

## 2017-09-30 ENCOUNTER — Ambulatory Visit: Payer: Self-pay | Admitting: Hematology and Oncology

## 2017-10-04 ENCOUNTER — Inpatient Hospital Stay: Payer: 59

## 2017-10-04 ENCOUNTER — Encounter: Payer: Self-pay | Admitting: Hematology and Oncology

## 2017-10-04 ENCOUNTER — Inpatient Hospital Stay (HOSPITAL_BASED_OUTPATIENT_CLINIC_OR_DEPARTMENT_OTHER): Payer: 59 | Admitting: Hematology and Oncology

## 2017-10-04 ENCOUNTER — Inpatient Hospital Stay: Payer: 59 | Attending: Hematology and Oncology

## 2017-10-04 VITALS — BP 127/85 | HR 52 | Temp 98.0°F | Resp 17 | Ht 64.0 in | Wt 126.8 lb

## 2017-10-04 DIAGNOSIS — Z79899 Other long term (current) drug therapy: Secondary | ICD-10-CM | POA: Diagnosis not present

## 2017-10-04 DIAGNOSIS — G8929 Other chronic pain: Secondary | ICD-10-CM

## 2017-10-04 DIAGNOSIS — B20 Human immunodeficiency virus [HIV] disease: Secondary | ICD-10-CM

## 2017-10-04 DIAGNOSIS — G620 Drug-induced polyneuropathy: Secondary | ICD-10-CM | POA: Diagnosis not present

## 2017-10-04 DIAGNOSIS — Z95828 Presence of other vascular implants and grafts: Secondary | ICD-10-CM

## 2017-10-04 DIAGNOSIS — C8118 Nodular sclerosis classical Hodgkin lymphoma, lymph nodes of multiple sites: Secondary | ICD-10-CM

## 2017-10-04 DIAGNOSIS — C8198 Hodgkin lymphoma, unspecified, lymph nodes of multiple sites: Secondary | ICD-10-CM

## 2017-10-04 DIAGNOSIS — T451X5A Adverse effect of antineoplastic and immunosuppressive drugs, initial encounter: Principal | ICD-10-CM

## 2017-10-04 DIAGNOSIS — M2550 Pain in unspecified joint: Secondary | ICD-10-CM

## 2017-10-04 DIAGNOSIS — D7589 Other specified diseases of blood and blood-forming organs: Secondary | ICD-10-CM | POA: Insufficient documentation

## 2017-10-04 DIAGNOSIS — Z5112 Encounter for antineoplastic immunotherapy: Secondary | ICD-10-CM | POA: Diagnosis present

## 2017-10-04 LAB — COMPREHENSIVE METABOLIC PANEL
ALK PHOS: 61 U/L (ref 40–150)
ALT: 22 U/L (ref 0–55)
ANION GAP: 11 (ref 3–11)
AST: 16 U/L (ref 5–34)
Albumin: 4.2 g/dL (ref 3.5–5.0)
BUN: 7 mg/dL (ref 7–26)
CALCIUM: 9.8 mg/dL (ref 8.4–10.4)
CO2: 22 mmol/L (ref 22–29)
Chloride: 108 mmol/L (ref 98–109)
Creatinine, Ser: 0.98 mg/dL (ref 0.60–1.10)
GFR calc non Af Amer: >60 mL/min (ref >60)
Glucose, Bld: 89 mg/dL (ref 70–140)
Potassium: 3.5 mmol/L (ref 3.5–5.1)
SODIUM: 141 mmol/L (ref 136–145)
Total Bilirubin: 0.4 mg/dL (ref 0.2–1.2)
Total Protein: 7.2 g/dL (ref 6.4–8.3)

## 2017-10-04 LAB — CBC WITH DIFFERENTIAL/PLATELET
Basophils Absolute: 0 10*3/uL (ref 0.0–0.1)
Basophils Relative: 0 %
EOS ABS: 0.1 10*3/uL (ref 0.0–0.5)
EOS PCT: 1 %
HCT: 41.8 % (ref 34.8–46.6)
Hemoglobin: 14.7 g/dL (ref 11.6–15.9)
Lymphocytes Relative: 27 %
Lymphs Abs: 2.7 10*3/uL (ref 0.9–3.3)
MCH: 36.8 pg — ABNORMAL HIGH (ref 25.1–34.0)
MCHC: 35.2 g/dL (ref 31.5–36.0)
MCV: 104.8 fL — ABNORMAL HIGH (ref 79.5–101.0)
MONOS PCT: 8 %
Monocytes Absolute: 0.8 10*3/uL (ref 0.1–0.9)
Neutro Abs: 6.5 10*3/uL (ref 1.5–6.5)
Neutrophils Relative %: 64 %
PLATELETS: 281 10*3/uL (ref 145–400)
RBC: 3.99 MIL/uL (ref 3.70–5.45)
RDW: 14.7 % — ABNORMAL HIGH (ref 11.2–14.5)
WBC: 10 10*3/uL (ref 3.9–10.3)

## 2017-10-04 LAB — PREGNANCY, URINE: Preg Test, Ur: NEGATIVE

## 2017-10-04 MED ORDER — SODIUM CHLORIDE 0.9 % IV SOLN
Freq: Once | INTRAVENOUS | Status: AC
  Administered 2017-10-04: 09:00:00 via INTRAVENOUS

## 2017-10-04 MED ORDER — TRAMADOL HCL 50 MG PO TABS: 100.0000 mg | ORAL_TABLET | Freq: Four times a day (QID) | ORAL | 0 refills | Status: DC | PRN

## 2017-10-04 MED ORDER — SODIUM CHLORIDE 0.9% FLUSH
10.0000 mL | INTRAVENOUS | Status: DC | PRN
  Administered 2017-10-04: 10 mL via INTRAVENOUS
  Filled 2017-10-04: qty 10

## 2017-10-04 MED ORDER — HEPARIN SOD (PORK) LOCK FLUSH 100 UNIT/ML IV SOLN
500.0000 [IU] | Freq: Once | INTRAVENOUS | Status: AC | PRN
  Administered 2017-10-04: 500 [IU]
  Filled 2017-10-04: qty 5

## 2017-10-04 MED ORDER — SODIUM CHLORIDE 0.9 % IV SOLN
200.0000 mg | Freq: Once | INTRAVENOUS | Status: AC
  Administered 2017-10-04: 200 mg via INTRAVENOUS
  Filled 2017-10-04: qty 8

## 2017-10-04 MED ORDER — SODIUM CHLORIDE 0.9% FLUSH
10.0000 mL | INTRAVENOUS | Status: DC | PRN
  Administered 2017-10-04: 10 mL
  Filled 2017-10-04: qty 10

## 2017-10-04 MED FILL — traMADol HCL 50 MG TABS: 50 | 12 days supply | Qty: 90 | Fill #0

## 2017-10-04 NOTE — Assessment & Plan Note (Signed)
She has multiple chronic joint pain and is dependent on tramadol to control her joint pain She is taking vitamin D supplement as prescribed 

## 2017-10-04 NOTE — Patient Instructions (Signed)
[facility] Discharge Instructions for Patients Receiving Chemotherapy  Today you received the following chemotherapy agents:  Keytruda.  To help prevent nausea and vomiting after your treatment, we encourage you to take your nausea medication as directed.   If you develop nausea and vomiting that is not controlled by your nausea medication, call the clinic.   BELOW ARE SYMPTOMS THAT SHOULD BE REPORTED IMMEDIATELY:  *FEVER GREATER THAN 100.5 F  *CHILLS WITH OR WITHOUT FEVER  NAUSEA AND VOMITING THAT IS NOT CONTROLLED WITH YOUR NAUSEA MEDICATION  *UNUSUAL SHORTNESS OF BREATH  *UNUSUAL BRUISING OR BLEEDING  TENDERNESS IN MOUTH AND THROAT WITH OR WITHOUT PRESENCE OF ULCERS  *URINARY PROBLEMS  *BOWEL PROBLEMS  UNUSUAL RASH Items with * indicate a potential emergency and should be followed up as soon as possible.  Feel free to call the clinic should you have any questions or concerns. The clinic phone number is [phone].  Please show the CHEMO ALERT CARD at check-in to the Emergency Department and triage nurse.    

## 2017-10-04 NOTE — Assessment & Plan Note (Signed)
she has mild peripheral neuropathy, likely related to side effects of treatment. It is only mild, not bothering the patient. I will observe for now I will add B12 level to next visit to exclude B12 deficiency

## 2017-10-04 NOTE — Assessment & Plan Note (Signed)
Recent imaging studies show no evidence of disease progression I recommend spacing out her treatment to once a month but keeping at the same dose She tolerated treatment well except for mild neuropathy She will continue Lupron injection to suppress ovarian function I plan to repeat another imaging in October

## 2017-10-04 NOTE — Progress Notes (Signed)
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 It is only mild, not bothering the patient. I will observe for now I will add B12 level to next visit to exclude B12 deficiency   Chronic joint pain She has multiple chronic joint pain and is dependent on tramadol to control her joint pain She is taking vitamin D supplement as prescribed   Orders Placed This Encounter  Procedures  . Vitamin B12    Standing Status:   Future    Standing Expiration Date:   11/08/2018    INTERVAL HISTORY: Please see below for problem oriented charting. 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      05/07/2014 Imaging    CT scan of the chest show right thoracic inlet lymphadenopathy      06/07/2014 Procedure    She underwent ultrasound-guided core biopsy of the neck lymph node      06/07/2014 Pathology Results    Accession: [id number] biopsy confirmed diagnosis of Hodgkin lymphoma.      06/15/2014 Imaging    Echocardiogram showed preserved ejection fraction      07/09/2014 - 07/12/2014 Hospital Admission    She was admitted to the hospital for severe anemia.      07/27/2014 Procedure    She had placement of port      07/31/2014 - 09/11/2014 Chemotherapy    She received dose adjusted chemotherapy due to abnormal liver function tests and severe anemia. Treatment was delayed due to noncompliance  and subsequently stopped because the patient failed to keep appointments      01/11/2015 Imaging    Repeat PET CT scan showed response to treatment      01/28/2015 - 06/18/2015 Chemotherapy    ABVD was restarted with full dose.      02/08/2015 - 02/10/2015 Hospital Admission    The patient was admitted to the hospital due to pancytopenia and profuse diarrhea. Cultures were negative. She was placed on ciprofloxacin.      02/11/2015 Adverse Reaction    Treatment was placed on hold due to recent infection.      04/11/2015 Imaging    PET CT scan showed near complete response. Incidental finding of an abnormal bone lesion, indeterminate. She is not symptomatic. Recommendation from Hem [facility]  to observe      07/11/2015 Imaging    PET CT scan showed abnormal new bone lesions, suggestive of possible disease progression      07/23/2015 Bone Marrow Biopsy    She underwent bone biopsy      07/23/2015 Pathology Results    Accession: [id number]  biopsy was negative for cancer      11/21/2015 Surgery    She had surgery for ectopic pregnancy      01/29/2016 Imaging    Ct chest, abdomen and pelvis showed pelvic and  retroperitoneal lymphadenopathy, as above, concerning for residual disease. There is also a mildly enlarged posterior mediastinal lymph node measuring 11 mm adjacent to the distal descending thoracic aorta. This may represent an additional focus of disease, but is the only finding of concern in the thorax on today's examination. Sclerosis in the right ilium at site of previously noted metabolically active lesion, grossly unchanged. No other definite osseous lesions are identified on today's examination. Spleen is normal in size and appearance.      02/14/2016 PET scan    Interval disease worsening with new foci of hypermetabolic activity in multiple retroperitoneal and pelvic lymph nodes as well as AP window and left hilar lymph nodes. (Deauville 5). There is also overall worsening of the osseous disease.      05/11/2016 Pathology Results    Diagnosis Lymph node, needle/core biopsy, Left para-aortic retroperitoneal - CLASSICAL HODGKIN LYMPHOMA. - SEE ONCOLOGY TABLE. Microscopic Comment LYMPHOMA Histologic type: Classical Hodgkin lymphoma. Grade (if applicable): N/A Flow cytometry: Not done. Immunohistochemical stains: CD15, CD20, CD3, LCA, PAX-5, CD30 with appropriate controls. Touch preps/imprints: Not performed. Comments: The sections show small needle core biopsy fragments displaying a polymorphous cellular proliferation of small lymphocytes, plasma cells, eosinophils, and large atypical mononuclear and multilobated lymphoid cells with features of Reed-Sternberg cells and variants. This is associated with patchy areas of fibrosis. Immunohistochemical stains were performed and show that the large atypical lymphoid cells are positive for CD30, CD15 and PAX-5 and negative for LCA, CD20, CD3. The small lymphoid cells in the background show a mixture of T and B cells with predominance of T cells. The overall morphologic and histologic features are consistent with classical Hodgkin lymphoma. Further  subtyping is challenging in limited small biopsy fragments but the patchy fibrosis suggests nodular sclerosis type.       05/11/2016 Procedure    She underwent CT guided biopsy of retroperitoneal lymph node      05/26/2016 Procedure    Successful placement of a right IJ approach Power Port with ultrasound and fluoroscopic guidance. The catheter is ready for use.      06/02/2016 PET scan    Mixed response to chemotherapy with some lymph nodes decreased in metabolic activity and some lymph nodes increase metabolic activity. Lymph node stations including mediastinum, periaortic retroperitoneum, and obturator node stations. Activity is remains relatively intense Deauville 4 2. LEFT infrahilar nodule / lymph node with intense metabolic activity decreased from prior. ( Deauville 4 ). 3. 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 Deauville 5      06/03/2016 - 06/05/2016 Hospital Admission    She was admitted to the hospital for cycle 1 of ICE chemotherapy      06/24/2016 - 06/26/2016 Hospital Admission    She received cycle 2 of ICE chemo      07/07/2016 PET scan    Resolution of prior hypermetabolic adenopathy and resolution of prior osseous foci of hypermetabolic activity compatible with  essentially complete response to therapy (Deauville 1). 2. Generalized reduced activity in the L4 vertebral body and the type of finding which would typically reflect prior radiation therapy. 3. Stable septated fatty right pelvic lesion, possibly a dermoid or lipoma, not hypermetabolic      1/61/0960 PET scan    Hypermetabolic lesion along the L3 vertebral body and left posterior elements, max SUV 7.8 (Deauville 5). Hypermetabolic lesion along the left inferior pubic ramus, max SUV 4.9 (Deauville 4).  IMPRESSION: Prevascular lymphadenopathy, reflecting nodal recurrence (Deauville 4). Hypermetabolic osseous metastases involving the L3 vertebral body/posterior elements and left inferior  pubic ramus (Deauville 4-5). Hypermetabolism along the endometrium, new, possibly reactive/physiologic. Consider pelvic ultrasound and/or endometrial sampling as clinically warranted.      11/04/2016 - 02/24/2017 Chemotherapy    She received Brentuximab      12/11/2016 PET scan    1. Mixed response to therapy within the skeleton. Lesions at L3 is decreased in size and metabolic activity. Residual activity remains above liver ( Deauville 4) 2. Increased activity in the LEFT sacrum with metabolic activity above liver activity ( Deauville 4 3. Decrease in size and metabolic activity of anterior mediastinal tissue consistent with resolution of thymic hyperplasia or resolution of lymphoma metabolic activity ( Deauville 2). 4. No new lymphadenopathy. Normal spleen and liver.      03/25/2017 PET scan    1. Increased metabolic activity in several normal sized retroperitoneal and right pelvic lymph nodes, Deauville 4. 2. Increase in size and metabolic activity of lesions in the L3 and L4 vertebral bodies and in the left sacrum, Deauville 5. 3. No recurrence of anterior mediastinal abnormal activity. 4. Other imaging findings of potential clinical significance: Chronic ethmoid and left maxillary sinusitis. Paraseptal emphysema at the lung apices. Right ovarian dermoid.      04/06/2017 -  Chemotherapy    The patient had Keytruda      06/09/2017 PET scan    1. Overall significant improvement with reduction in nodal activity. Several retroperitoneal nodes which were previously Deauville 4 are currently Deauville 3. The right common iliac lymph node remains at Deauville 4 with maximum SUV of 3.4, but is improved from prior SUV of 4.3. 2. The bony lesions show the greatest improvement, with the previously Deauville 5 hypermetabolic lesions currently no longer of higher metabolic activity than surrounding bone marrow, currently measuring at Deauville 3. 3. 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 4. No new lesions are identified. 5. Emphysema (ICD10-J43.9).      08/05/2017 Imaging    1. Somewhat complex left adnexal lesion, potentially hemorrhagic cyst measuring 2.9 x 2.8 cm. The possibility of torsion in the left ovary must be considered. Advise correlation with pelvic ultrasound including Doppler assessment to further evaluate.  2.  Right ovarian dermoid, unchanged from recent PET-CT examination.  3. No adenopathy by size criteria evident. Appearance of subcentimeter mesenteric lymph nodes is stable compared to recent study.  4. No bone lesions appreciable by CT. Areas of abnormal radiotracer uptake in the mid lumbar spine noted on recent PET-CT examination. No destruction 2 or lytic change noted in the lumbar vertebrae currently there.  5. No bowel obstruction. No abscess. No periappendiceal region inflammation.  6.  Small hiatal hernia with fluid in distal esophagus.  7.  Foci of coronary artery calcification, advanced for age.  8.  Spleen normal in size and contour.  9. Small hiatal hernia with mild fluid in the distal esophagus, likely indicative of  a degree of reflux.       REVIEW OF SYSTEMS:   Constitutional: Denies fevers, chills or abnormal weight loss Eyes: Denies blurriness of vision Ears, nose, mouth, throat, and face: Denies mucositis or sore throat Respiratory: Denies cough, dyspnea or wheezes Cardiovascular: Denies palpitation, chest discomfort or lower extremity swelling Gastrointestinal:  Denies nausea, heartburn or change in bowel habits Skin: Denies abnormal skin rashes Lymphatics: Denies new lymphadenopathy or easy bruising Neurological:Denies numbness, tingling or new weaknesses Behavioral/Psych: Mood is stable, no new changes  All other systems were reviewed with the patient and are negative.  I have reviewed the past medical history, past surgical history, social history and family history with  the patient and they are unchanged from previous note.  ALLERGIES:  is allergic to temazepam.  MEDICATIONS:  Current Outpatient Medications  Medication Sig Dispense Refill  . atazanavir (REYATAZ) 300 MG capsule Take 1 capsule (300 mg total) by mouth daily with breakfast. Take with Norvir. 30 capsule 5  . cholecalciferol (VITAMIN D) 1000 units tablet Take 1 tablet (1,000 Units total) by mouth daily. 30 tablet 9  . emtricitabine-tenofovir (TRUVADA) 200-300 MG tablet Take 1 tablet by mouth daily. 30 tablet 5  . ibuprofen (ADVIL,MOTRIN) 600 MG tablet Take 1 tablet (600 mg total) by mouth every 6 (six) hours as needed. 30 tablet 0  . lidocaine-prilocaine (EMLA) cream Apply 1 application topically as needed. 30 g 11  . ondansetron (ZOFRAN ODT) 4 MG disintegrating tablet Take 1 tablet (4 mg total) by mouth every 8 (eight) hours as needed for nausea. 6 tablet 0  . Polyvinyl Alcohol-Povidone (CLEAR EYES ALL SEASONS OP) Place 1 drop into both eyes daily.    . ritonavir (NORVIR) 100 MG TABS tablet Take 1 tablet by mouth every morning with breakfast. Take with Reyataz. 30 tablet 5  . traMADol (ULTRAM) 50 MG tablet Take 2 tablets (100 mg total) by mouth every 6 (six) hours as needed for moderate pain. 90 tablet 0  . triamcinolone cream (KENALOG) 0.1 % Apply 1 application topically 2 (two) times daily. (Patient taking differently: Apply 1 application topically 2 (two) times daily as needed (skin irritation). ) 30 g 0   No current facility-administered medications for this visit.     PHYSICAL EXAMINATION: ECOG PERFORMANCE STATUS: 1 - Symptomatic but completely ambulatory  Vitals:   10/04/17 0832  BP: 127/85  Pulse: (!) 52  Resp: 17  Temp: 98 F (36.7 C)   Filed Weights   10/04/17 0832  Weight: 126 lb 12.8 oz (57.5 kg)    GENERAL:alert, no distress and comfortable SKIN: skin color, texture, turgor are normal, no rashes or significant lesions EYES: normal, Conjunctiva are pink and non-injected,  sclera clear OROPHARYNX:no exudate, no erythema and lips, buccal mucosa, and tongue normal  NECK: supple, thyroid normal size, non-tender, without nodularity LYMPH:  no palpable lymphadenopathy in the cervical, axillary or inguinal LUNGS: clear to auscultation and percussion with normal breathing effort HEART: regular rate & rhythm and no murmurs and no lower extremity edema ABDOMEN:abdomen soft, non-tender and normal bowel sounds Musculoskeletal:no cyanosis of digits and no clubbing  NEURO: alert & oriented x 3 with fluent speech, no focal motor/sensory deficits  LABORATORY DATA:  I have reviewed the data as listed    Component Value Date/Time   NA 139 09/02/2017 0743   NA 140 04/29/2017 0745   K 3.3 (L) 09/02/2017 0743   K 3.3 (L) 04/29/2017 0745   CL 107 09/02/2017 0743   CO2  24 09/02/2017 0743   CO2 23 04/29/2017 0745   GLUCOSE 88 09/02/2017 0743   GLUCOSE 83 04/29/2017 0745   BUN 9 09/02/2017 0743   BUN 10.7 04/29/2017 0745   CREATININE 0.96 09/02/2017 0743   CREATININE 1.2 (H) 04/29/2017 0745   CALCIUM 9.6 09/02/2017 0743   CALCIUM 9.8 04/29/2017 0745   PROT 7.2 09/02/2017 0743   PROT 7.6 04/29/2017 0745   ALBUMIN 4.0 09/02/2017 0743   ALBUMIN 4.0 04/29/2017 0745   AST 18 09/02/2017 0743   AST 19 04/29/2017 0745   ALT 19 09/02/2017 0743   ALT 19 04/29/2017 0745   ALKPHOS 77 09/02/2017 0743   ALKPHOS 81 04/29/2017 0745   BILITOT 0.5 09/02/2017 0743   BILITOT 1.31 (H) 04/29/2017 0745   GFRNONAA >60 09/02/2017 0743   GFRNONAA 83 02/20/2016 0908   GFRAA >60 09/02/2017 0743   GFRAA >89 02/20/2016 0908    No results found for: SPEP, UPEP  Lab Results  Component Value Date   WBC 10.0 10/04/2017   NEUTROABS 6.5 10/04/2017   HGB 14.7 10/04/2017   HCT 41.8 10/04/2017   MCV 104.8 (H) 10/04/2017   PLT 281 10/04/2017      Chemistry      Component Value Date/Time   NA 139 09/02/2017 0743   NA 140 04/29/2017 0745   K 3.3 (L) 09/02/2017 0743   K 3.3 (L)  04/29/2017 0745   CL 107 09/02/2017 0743   CO2 24 09/02/2017 0743   CO2 23 04/29/2017 0745   BUN 9 09/02/2017 0743   BUN 10.7 04/29/2017 0745   CREATININE 0.96 09/02/2017 0743   CREATININE 1.2 (H) 04/29/2017 0745      Component Value Date/Time   CALCIUM 9.6 09/02/2017 0743   CALCIUM 9.8 04/29/2017 0745   ALKPHOS 77 09/02/2017 0743   ALKPHOS 81 04/29/2017 0745   AST 18 09/02/2017 0743   AST 19 04/29/2017 0745   ALT 19 09/02/2017 0743   ALT 19 04/29/2017 0745   BILITOT 0.5 09/02/2017 0743   BILITOT 1.31 (H) 04/29/2017 0745     All questions were answered. The patient knows to call the clinic with any problems, questions or concerns. No barriers to learning was detected.  I spent 15 minutes counseling the patient face to face. The total time spent in the appointment was 20 minutes and more than 50% was on counseling and review of test results  Heath Lark, MD 10/04/2017 8:37 AM

## 2017-10-04 NOTE — Assessment & Plan Note (Signed)
She will continue anti-retroviral treatment as directed

## 2017-10-12 ENCOUNTER — Telehealth: Payer: Self-pay | Admitting: *Deleted

## 2017-10-12 NOTE — Telephone Encounter (Signed)
Jonne left a message stating she needs a refill of her tramadol on Wednesday, 6/19. Recently had a tooth pulled.

## 2017-10-12 NOTE — Telephone Encounter (Signed)
We will refill tomorrow

## 2017-10-13 ENCOUNTER — Other Ambulatory Visit: Payer: Self-pay | Admitting: *Deleted

## 2017-10-13 DIAGNOSIS — B20 Human immunodeficiency virus [HIV] disease: Secondary | ICD-10-CM

## 2017-10-13 MED ORDER — TRAMADOL HCL 50 MG PO TABS: 100.0000 mg | ORAL_TABLET | Freq: Four times a day (QID) | ORAL | 0 refills | Status: DC | PRN

## 2017-10-25 ENCOUNTER — Other Ambulatory Visit: Payer: Self-pay | Admitting: Pharmacist

## 2017-10-25 ENCOUNTER — Other Ambulatory Visit: Payer: Self-pay

## 2017-10-25 ENCOUNTER — Telehealth: Payer: Self-pay

## 2017-10-25 DIAGNOSIS — B20 Human immunodeficiency virus [HIV] disease: Secondary | ICD-10-CM

## 2017-10-25 MED ORDER — TRAMADOL HCL 50 MG PO TABS: 100.0000 mg | ORAL_TABLET | Freq: Four times a day (QID) | ORAL | 0 refills | Status: DC | PRN

## 2017-10-25 MED ORDER — ATAZANAVIR SULFATE 300 MG PO CAPS: 300.0000 mg | ORAL_CAPSULE | Freq: Every day | ORAL | 5 refills | Status: DC

## 2017-10-25 MED ORDER — EMTRICITABINE-TENOFOVIR DF 200-300 MG PO TABS: 1.0000 | ORAL_TABLET | Freq: Every day | ORAL | 5 refills | Status: DC

## 2017-10-25 MED ORDER — RITONAVIR 100 MG PO TABS: ORAL_TABLET | ORAL | 5 refills | Status: DC

## 2017-10-25 MED FILL — traMADol HCL 50 MG TABS: 50 | 12 days supply | Qty: 90 | Fill #0

## 2017-10-25 NOTE — Telephone Encounter (Signed)
She called requesting refill on Tramadol. 

## 2017-10-25 NOTE — Telephone Encounter (Signed)
Called and told Rx ready for pick up. Verbalized understanding. 

## 2017-11-01 ENCOUNTER — Ambulatory Visit: Payer: Self-pay

## 2017-11-01 ENCOUNTER — Inpatient Hospital Stay: Payer: 59

## 2017-11-01 ENCOUNTER — Inpatient Hospital Stay: Payer: 59 | Admitting: Hematology and Oncology

## 2017-11-01 ENCOUNTER — Ambulatory Visit: Payer: 59

## 2017-11-01 ENCOUNTER — Other Ambulatory Visit: Payer: Self-pay

## 2017-11-01 ENCOUNTER — Ambulatory Visit: Payer: Self-pay | Admitting: Hematology and Oncology

## 2017-11-01 DIAGNOSIS — C8118 Nodular sclerosis classical Hodgkin lymphoma, lymph nodes of multiple sites: Secondary | ICD-10-CM | POA: Diagnosis present

## 2017-11-01 DIAGNOSIS — Z5111 Encounter for antineoplastic chemotherapy: Secondary | ICD-10-CM | POA: Insufficient documentation

## 2017-11-01 DIAGNOSIS — Z79899 Other long term (current) drug therapy: Secondary | ICD-10-CM | POA: Diagnosis not present

## 2017-11-01 DIAGNOSIS — Z5112 Encounter for antineoplastic immunotherapy: Secondary | ICD-10-CM | POA: Diagnosis not present

## 2017-11-01 LAB — VITAMIN B12: VITAMIN B 12: 348 pg/mL (ref 180–914)

## 2017-11-01 LAB — CBC WITH DIFFERENTIAL/PLATELET
BASOS ABS: 0 10*3/uL (ref 0.0–0.1)
BASOS PCT: 0 %
EOS PCT: 2 %
Eosinophils Absolute: 0.2 10*3/uL (ref 0.0–0.5)
HCT: 42 % (ref 34.8–46.6)
Hemoglobin: 14.5 g/dL (ref 11.6–15.9)
Lymphocytes Relative: 27 %
Lymphs Abs: 2.5 10*3/uL (ref 0.9–3.3)
MCH: 35.9 pg — ABNORMAL HIGH (ref 25.1–34.0)
MCHC: 34.5 g/dL (ref 31.5–36.0)
MCV: 104 fL — AB (ref 79.5–101.0)
MONO ABS: 0.5 10*3/uL (ref 0.1–0.9)
Monocytes Relative: 6 %
Neutro Abs: 6.2 10*3/uL (ref 1.5–6.5)
Neutrophils Relative %: 65 %
Platelets: 276 10*3/uL (ref 145–400)
RBC: 4.04 MIL/uL (ref 3.70–5.45)
RDW: 13.5 % (ref 11.2–14.5)
WBC: 9.4 10*3/uL (ref 3.9–10.3)

## 2017-11-01 LAB — TSH: TSH: 1.993 u[IU]/mL (ref 0.308–3.960)

## 2017-11-01 LAB — COMPREHENSIVE METABOLIC PANEL
ALBUMIN: 4.5 g/dL (ref 3.5–5.0)
ALT: 19 U/L (ref 0–44)
ANION GAP: 8 (ref 5–15)
AST: 18 U/L (ref 15–41)
Alkaline Phosphatase: 83 U/L (ref 38–126)
BILIRUBIN TOTAL: 1.9 mg/dL — AB (ref 0.3–1.2)
BUN: 9 mg/dL (ref 6–20)
CHLORIDE: 107 mmol/L (ref 98–111)
CO2: 24 mmol/L (ref 22–32)
Calcium: 10.2 mg/dL (ref 8.9–10.3)
Creatinine, Ser: 1.01 mg/dL — ABNORMAL HIGH (ref 0.44–1.00)
GFR calc Af Amer: >60 mL/min (ref >60)
Glucose, Bld: 80 mg/dL (ref 70–99)
POTASSIUM: 3.4 mmol/L — AB (ref 3.5–5.1)
Sodium: 139 mmol/L (ref 135–145)
TOTAL PROTEIN: 7.9 g/dL (ref 6.5–8.1)

## 2017-11-01 LAB — PREGNANCY, URINE: PREG TEST UR: NEGATIVE

## 2017-11-01 MED ORDER — SODIUM CHLORIDE 0.9% FLUSH
10.0000 mL | INTRAVENOUS | Status: DC | PRN
  Administered 2017-11-01: 10 mL via INTRAVENOUS
  Filled 2017-11-01: qty 10

## 2017-11-01 NOTE — Progress Notes (Signed)
Pt showed up early, accessed port early and patent came to me after the fact and wanted port cath removed stated she was going home. Flushed and removed cath.

## 2017-11-03 ENCOUNTER — Telehealth: Payer: Self-pay | Admitting: Hematology and Oncology

## 2017-11-03 ENCOUNTER — Inpatient Hospital Stay: Payer: 59 | Attending: Hematology and Oncology

## 2017-11-03 ENCOUNTER — Other Ambulatory Visit: Payer: Self-pay | Admitting: *Deleted

## 2017-11-03 ENCOUNTER — Inpatient Hospital Stay: Payer: 59

## 2017-11-03 VITALS — BP 129/90 | HR 81 | Temp 98.6°F | Resp 19 | Wt 126.5 lb

## 2017-11-03 DIAGNOSIS — Z5112 Encounter for antineoplastic immunotherapy: Secondary | ICD-10-CM | POA: Diagnosis not present

## 2017-11-03 DIAGNOSIS — C8198 Hodgkin lymphoma, unspecified, lymph nodes of multiple sites: Secondary | ICD-10-CM

## 2017-11-03 DIAGNOSIS — C8118 Nodular sclerosis classical Hodgkin lymphoma, lymph nodes of multiple sites: Secondary | ICD-10-CM

## 2017-11-03 DIAGNOSIS — Z95828 Presence of other vascular implants and grafts: Secondary | ICD-10-CM

## 2017-11-03 DIAGNOSIS — B20 Human immunodeficiency virus [HIV] disease: Secondary | ICD-10-CM

## 2017-11-03 LAB — PREGNANCY, URINE: Preg Test, Ur: INVALID — AB

## 2017-11-03 MED ORDER — LEUPROLIDE ACETATE 7.5 MG IM KIT
7.5000 mg | PACK | Freq: Once | INTRAMUSCULAR | Status: AC
  Administered 2017-11-03: 7.5 mg via INTRAMUSCULAR
  Filled 2017-11-03: qty 7.5

## 2017-11-03 MED ORDER — HEPARIN SOD (PORK) LOCK FLUSH 100 UNIT/ML IV SOLN
500.0000 [IU] | Freq: Once | INTRAVENOUS | Status: AC | PRN
Start: 2017-11-03 — End: 2017-11-03
  Administered 2017-11-03: 500 [IU]
  Filled 2017-11-03: qty 5

## 2017-11-03 MED ORDER — SODIUM CHLORIDE 0.9% FLUSH
10.0000 mL | INTRAVENOUS | Status: DC | PRN
  Administered 2017-11-03: 10 mL
  Filled 2017-11-03: qty 10

## 2017-11-03 MED ORDER — SODIUM CHLORIDE 0.9 % IV SOLN
Freq: Once | INTRAVENOUS | Status: AC
  Administered 2017-11-03: 15:00:00 via INTRAVENOUS

## 2017-11-03 MED ORDER — TRAMADOL HCL 50 MG PO TABS: 100.0000 mg | ORAL_TABLET | Freq: Four times a day (QID) | ORAL | 0 refills | Status: DC | PRN

## 2017-11-03 MED ORDER — SODIUM CHLORIDE 0.9 % IV SOLN
200.0000 mg | Freq: Once | INTRAVENOUS | Status: AC
  Administered 2017-11-03: 200 mg via INTRAVENOUS
  Filled 2017-11-03: qty 8

## 2017-11-03 MED ORDER — LEUPROLIDE ACETATE (3 MONTH) 22.5 MG IM KIT
PACK | INTRAMUSCULAR | Status: AC
  Filled 2017-11-03: qty 22.5

## 2017-11-03 MED ORDER — SODIUM CHLORIDE 0.9% FLUSH
10.0000 mL | INTRAVENOUS | Status: DC | PRN
  Administered 2017-11-03: 10 mL via INTRAVENOUS
  Filled 2017-11-03: qty 10

## 2017-11-03 NOTE — Progress Notes (Signed)
Dr. Alen Blew ok to tx today with bili 1.9.

## 2017-11-03 NOTE — Patient Instructions (Signed)
[facility] Discharge Instructions for Patients Receiving Chemotherapy  Today you received the following chemotherapy agents:  Keytruda.  To help prevent nausea and vomiting after your treatment, we encourage you to take your nausea medication as directed.   If you develop nausea and vomiting that is not controlled by your nausea medication, call the clinic.   BELOW ARE SYMPTOMS THAT SHOULD BE REPORTED IMMEDIATELY:  *FEVER GREATER THAN 100.5 F  *CHILLS WITH OR WITHOUT FEVER  NAUSEA AND VOMITING THAT IS NOT CONTROLLED WITH YOUR NAUSEA MEDICATION  *UNUSUAL SHORTNESS OF BREATH  *UNUSUAL BRUISING OR BLEEDING  TENDERNESS IN MOUTH AND THROAT WITH OR WITHOUT PRESENCE OF ULCERS  *URINARY PROBLEMS  *BOWEL PROBLEMS  UNUSUAL RASH Items with * indicate a potential emergency and should be followed up as soon as possible.  Feel free to call the clinic should you have any questions or concerns. The clinic phone number is [phone].  Please show the CHEMO ALERT CARD at check-in to the Emergency Department and triage nurse.    

## 2017-11-03 NOTE — Telephone Encounter (Signed)
Left message for patient regarding upcoming July appts. Patient scheduled per NG. Patient missed last treatment and had to r/s all upcoming appts.

## 2017-11-03 NOTE — Progress Notes (Signed)
Prescription for Tramadol given to the pt by this RN while she was in the infusion room

## 2017-11-03 NOTE — Telephone Encounter (Signed)
Patient scheduled per 7/8 sch message. OK per melanie to add treatment 7/10

## 2017-11-04 MED FILL — traMADol HCL 50 MG TABS: 50 | 12 days supply | Qty: 90 | Fill #0

## 2017-11-15 ENCOUNTER — Other Ambulatory Visit: Payer: Self-pay | Admitting: *Deleted

## 2017-11-15 DIAGNOSIS — B20 Human immunodeficiency virus [HIV] disease: Secondary | ICD-10-CM

## 2017-11-15 MED ORDER — TRAMADOL HCL 50 MG PO TABS: 100.0000 mg | ORAL_TABLET | Freq: Four times a day (QID) | ORAL | 0 refills | Status: DC | PRN

## 2017-11-15 MED FILL — traMADol HCL 50 MG TABS: 50 | 12 days supply | Qty: 90 | Fill #0

## 2017-11-15 NOTE — Telephone Encounter (Signed)
Pt called for tramadol refill

## 2017-11-22 ENCOUNTER — Other Ambulatory Visit: Payer: Self-pay | Admitting: Hematology and Oncology

## 2017-11-22 ENCOUNTER — Telehealth: Payer: Self-pay

## 2017-11-22 NOTE — Telephone Encounter (Signed)
Called per Dr. Alvy Bimler. Canceling 7/31 appts, appts are one week to early. She verbalized understanding. Scheduling message sent for appts on 8/7.

## 2017-11-24 ENCOUNTER — Other Ambulatory Visit: Payer: Self-pay

## 2017-11-24 ENCOUNTER — Ambulatory Visit: Payer: Self-pay | Admitting: Hematology and Oncology

## 2017-11-24 ENCOUNTER — Ambulatory Visit: Payer: Self-pay

## 2017-11-25 ENCOUNTER — Other Ambulatory Visit: Payer: Self-pay

## 2017-11-25 ENCOUNTER — Telehealth: Payer: Self-pay

## 2017-11-25 DIAGNOSIS — B20 Human immunodeficiency virus [HIV] disease: Secondary | ICD-10-CM

## 2017-11-25 MED ORDER — TRAMADOL HCL 50 MG PO TABS: 100.0000 mg | ORAL_TABLET | Freq: Four times a day (QID) | ORAL | 0 refills | Status: DC | PRN

## 2017-11-25 MED FILL — traMADol HCL 50 MG TABS: 50 | 12 days supply | Qty: 90 | Fill #0

## 2017-11-25 NOTE — Telephone Encounter (Signed)
Called and told her Rx ready for pick up. She verbalized understanding.  

## 2017-11-25 NOTE — Telephone Encounter (Signed)
She called requesting Tramadol refill.

## 2017-11-29 ENCOUNTER — Telehealth: Payer: Self-pay | Admitting: Hematology and Oncology

## 2017-11-29 ENCOUNTER — Other Ambulatory Visit: Payer: Self-pay | Admitting: Hematology and Oncology

## 2017-11-29 DIAGNOSIS — Z5111 Encounter for antineoplastic chemotherapy: Secondary | ICD-10-CM

## 2017-11-29 DIAGNOSIS — C8198 Hodgkin lymphoma, unspecified, lymph nodes of multiple sites: Secondary | ICD-10-CM

## 2017-11-29 NOTE — Telephone Encounter (Signed)
Left message for patient regarding upcoming aug appts per 7/29 sch message

## 2017-11-30 ENCOUNTER — Other Ambulatory Visit: Payer: Self-pay

## 2017-11-30 ENCOUNTER — Telehealth: Payer: Self-pay | Admitting: Hematology and Oncology

## 2017-11-30 ENCOUNTER — Emergency Department (HOSPITAL_COMMUNITY)
Admission: EM | Admit: 2017-11-30 | Discharge: 2017-11-30 | Disposition: A | Discharge status: Home / Self Care | Payer: 59 | Attending: Emergency Medicine | Admitting: Emergency Medicine

## 2017-11-30 ENCOUNTER — Encounter (HOSPITAL_COMMUNITY): Payer: Self-pay

## 2017-11-30 DIAGNOSIS — R1110 Vomiting, unspecified: Secondary | ICD-10-CM | POA: Diagnosis present

## 2017-11-30 DIAGNOSIS — Z79899 Other long term (current) drug therapy: Secondary | ICD-10-CM | POA: Diagnosis not present

## 2017-11-30 DIAGNOSIS — R197 Diarrhea, unspecified: Secondary | ICD-10-CM | POA: Insufficient documentation

## 2017-11-30 DIAGNOSIS — F17210 Nicotine dependence, cigarettes, uncomplicated: Secondary | ICD-10-CM | POA: Insufficient documentation

## 2017-11-30 DIAGNOSIS — C8190 Hodgkin lymphoma, unspecified, unspecified site: Secondary | ICD-10-CM | POA: Diagnosis not present

## 2017-11-30 DIAGNOSIS — B3731 Acute candidiasis of vulva and vagina: Secondary | ICD-10-CM

## 2017-11-30 DIAGNOSIS — B373 Candidiasis of vulva and vagina: Secondary | ICD-10-CM

## 2017-11-30 LAB — COMPREHENSIVE METABOLIC PANEL
ALBUMIN: 4.7 g/dL (ref 3.5–5.0)
ALT: 20 U/L (ref 0–44)
ANION GAP: 10 (ref 5–15)
AST: 21 U/L (ref 15–41)
Alkaline Phosphatase: 74 U/L (ref 38–126)
BUN: 9 mg/dL (ref 6–20)
CO2: 24 mmol/L (ref 22–32)
Calcium: 10.1 mg/dL (ref 8.9–10.3)
Chloride: 109 mmol/L (ref 98–111)
Creatinine, Ser: 1.06 mg/dL — ABNORMAL HIGH (ref 0.44–1.00)
GFR calc Af Amer: >60 mL/min (ref >60)
GFR calc non Af Amer: >60 mL/min (ref >60)
GLUCOSE: 107 mg/dL — AB (ref 70–99)
POTASSIUM: 3.5 mmol/L (ref 3.5–5.1)
SODIUM: 143 mmol/L (ref 135–145)
TOTAL PROTEIN: 8.2 g/dL — AB (ref 6.5–8.1)
Total Bilirubin: 1.3 mg/dL — ABNORMAL HIGH (ref 0.3–1.2)

## 2017-11-30 LAB — URINALYSIS, ROUTINE W REFLEX MICROSCOPIC
Bilirubin Urine: NEGATIVE
GLUCOSE, UA: NEGATIVE mg/dL
Hgb urine dipstick: NEGATIVE
KETONES UR: 5 mg/dL — AB
LEUKOCYTES UA: NEGATIVE
NITRITE: NEGATIVE
PH: 5 (ref 5.0–8.0)
Protein, ur: 30 mg/dL — AB
Specific Gravity, Urine: 1.024 (ref 1.005–1.030)

## 2017-11-30 LAB — I-STAT BETA HCG BLOOD, ED (MC, WL, AP ONLY): I-stat hCG, quantitative: <5 m[IU]/mL (ref <5)

## 2017-11-30 LAB — CBC
HCT: 40.1 % (ref 36.0–46.0)
HEMOGLOBIN: 14.4 g/dL (ref 12.0–15.0)
MCH: 36.6 pg — ABNORMAL HIGH (ref 26.0–34.0)
MCHC: 35.9 g/dL (ref 30.0–36.0)
MCV: 102 fL — ABNORMAL HIGH (ref 78.0–100.0)
PLATELETS: 336 10*3/uL (ref 150–400)
RBC: 3.93 MIL/uL (ref 3.87–5.11)
RDW: 13.3 % (ref 11.5–15.5)
WBC: 9.3 10*3/uL (ref 4.0–10.5)

## 2017-11-30 LAB — LIPASE, BLOOD: Lipase: 26 U/L (ref 11–51)

## 2017-11-30 LAB — POC OCCULT BLOOD, ED: FECAL OCCULT BLD: POSITIVE — AB

## 2017-11-30 MED ORDER — HYDROCORTISONE 0.5 % EX CREA
TOPICAL_CREAM | Freq: Once | CUTANEOUS | Status: AC
  Administered 2017-11-30: 19:00:00 via TOPICAL
  Filled 2017-11-30: qty 28.35

## 2017-11-30 MED ORDER — TRAMADOL HCL 50 MG PO TABS
50.0000 mg | ORAL_TABLET | Freq: Once | ORAL | Status: AC
  Administered 2017-11-30: 50 mg via ORAL
  Filled 2017-11-30: qty 1

## 2017-11-30 MED ORDER — FLUCONAZOLE 150 MG PO TABS: 150.0000 mg | ORAL_TABLET | ORAL | 0 refills | Status: DC | PRN

## 2017-11-30 MED ORDER — ONDANSETRON 8 MG PO TBDP: 8.0000 mg | ORAL_TABLET | Freq: Three times a day (TID) | ORAL | 0 refills | Status: DC | PRN

## 2017-11-30 MED ORDER — ONDANSETRON 8 MG PO TBDP
8.0000 mg | ORAL_TABLET | Freq: Once | ORAL | Status: AC
  Administered 2017-11-30: 8 mg via ORAL
  Filled 2017-11-30: qty 1

## 2017-11-30 NOTE — ED Triage Notes (Signed)
Pt states for the last 2 days, she has been vomiting, coughing, and unable to eat.  Pt states she has thrown up 7 x in 2 days. Pt states she is feeling weaker than normal. Ambulatory in triage. Pt also states lower back pain. Pt states diarrhea 3x per day as well
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
## 2017-11-30 NOTE — Discharge Instructions (Addendum)
We signed the ER for the rash and vomiting with diarrhea.  All the ER results have returned reassuring. You have not had severe diarrhea while in the ER.  Given that your medical comorbidities, it is prudent that you follow-up with your infectious disease doctor and also your cancer doctor.  If you start having fevers, worsening bloody stools -please return to the ER immediately.

## 2017-11-30 NOTE — Telephone Encounter (Signed)
Patient called to r/s appts to earlier same day per 8/6 sch message

## 2017-12-02 ENCOUNTER — Inpatient Hospital Stay: Payer: 59 | Attending: Hematology and Oncology

## 2017-12-02 ENCOUNTER — Inpatient Hospital Stay: Payer: 59 | Admitting: Medical

## 2017-12-02 ENCOUNTER — Encounter: Payer: Self-pay | Admitting: Hematology and Oncology

## 2017-12-02 ENCOUNTER — Inpatient Hospital Stay: Payer: 59

## 2017-12-02 ENCOUNTER — Inpatient Hospital Stay: Payer: 59 | Admitting: Hematology and Oncology

## 2017-12-02 ENCOUNTER — Other Ambulatory Visit: Payer: Self-pay | Admitting: Medical

## 2017-12-02 ENCOUNTER — Inpatient Hospital Stay (HOSPITAL_BASED_OUTPATIENT_CLINIC_OR_DEPARTMENT_OTHER): Payer: 59 | Admitting: Hematology and Oncology

## 2017-12-02 VITALS — BP 108/77 | HR 77 | Temp 98.3°F | Resp 18 | Ht 64.0 in | Wt 121.0 lb

## 2017-12-02 DIAGNOSIS — R945 Abnormal results of liver function studies: Secondary | ICD-10-CM | POA: Insufficient documentation

## 2017-12-02 DIAGNOSIS — R197 Diarrhea, unspecified: Secondary | ICD-10-CM | POA: Insufficient documentation

## 2017-12-02 DIAGNOSIS — R7989 Other specified abnormal findings of blood chemistry: Secondary | ICD-10-CM

## 2017-12-02 DIAGNOSIS — R51 Headache: Principal | ICD-10-CM

## 2017-12-02 DIAGNOSIS — M2550 Pain in unspecified joint: Secondary | ICD-10-CM | POA: Diagnosis not present

## 2017-12-02 DIAGNOSIS — C8198 Hodgkin lymphoma, unspecified, lymph nodes of multiple sites: Secondary | ICD-10-CM

## 2017-12-02 DIAGNOSIS — Z79899 Other long term (current) drug therapy: Secondary | ICD-10-CM | POA: Insufficient documentation

## 2017-12-02 DIAGNOSIS — G8929 Other chronic pain: Secondary | ICD-10-CM

## 2017-12-02 DIAGNOSIS — C8118 Nodular sclerosis classical Hodgkin lymphoma, lymph nodes of multiple sites: Secondary | ICD-10-CM

## 2017-12-02 DIAGNOSIS — Z5112 Encounter for antineoplastic immunotherapy: Secondary | ICD-10-CM | POA: Insufficient documentation

## 2017-12-02 DIAGNOSIS — R519 Headache, unspecified: Secondary | ICD-10-CM

## 2017-12-02 DIAGNOSIS — Z5111 Encounter for antineoplastic chemotherapy: Secondary | ICD-10-CM

## 2017-12-02 LAB — COMPREHENSIVE METABOLIC PANEL
ALT: 19 U/L (ref 0–44)
ANION GAP: 12 (ref 5–15)
AST: 18 U/L (ref 15–41)
Albumin: 4.4 g/dL (ref 3.5–5.0)
Alkaline Phosphatase: 83 U/L (ref 38–126)
BUN: 7 mg/dL (ref 6–20)
CO2: 22 mmol/L (ref 22–32)
Calcium: 9.8 mg/dL (ref 8.9–10.3)
Chloride: 106 mmol/L (ref 98–111)
Creatinine, Ser: 1.06 mg/dL — ABNORMAL HIGH (ref 0.44–1.00)
GFR calc Af Amer: >60 mL/min (ref >60)
Glucose, Bld: 86 mg/dL (ref 70–99)
POTASSIUM: 3.4 mmol/L — AB (ref 3.5–5.1)
Sodium: 140 mmol/L (ref 135–145)
Total Bilirubin: 2.2 mg/dL — ABNORMAL HIGH (ref 0.3–1.2)
Total Protein: 7.7 g/dL (ref 6.5–8.1)

## 2017-12-02 LAB — CBC WITH DIFFERENTIAL/PLATELET
Basophils Absolute: 0 10*3/uL (ref 0.0–0.1)
Basophils Relative: 1 %
Eosinophils Absolute: 0.2 10*3/uL (ref 0.0–0.5)
Eosinophils Relative: 3 %
HCT: 40.2 % (ref 34.8–46.6)
Hemoglobin: 14.1 g/dL (ref 11.6–15.9)
LYMPHS PCT: 31 %
Lymphs Abs: 2.1 10*3/uL (ref 0.9–3.3)
MCH: 35.9 pg — ABNORMAL HIGH (ref 25.1–34.0)
MCHC: 35.1 g/dL (ref 31.5–36.0)
MCV: 102.3 fL — AB (ref 79.5–101.0)
MONOS PCT: 8 %
Monocytes Absolute: 0.5 10*3/uL (ref 0.1–0.9)
Neutro Abs: 3.8 10*3/uL (ref 1.5–6.5)
Neutrophils Relative %: 57 %
Platelets: 307 10*3/uL (ref 145–400)
RBC: 3.93 MIL/uL (ref 3.70–5.45)
RDW: 13.2 % (ref 11.2–14.5)
WBC: 6.6 10*3/uL (ref 3.9–10.3)

## 2017-12-02 LAB — TSH: TSH: 1.799 u[IU]/mL (ref 0.308–3.960)

## 2017-12-02 MED ORDER — MORPHINE SULFATE (PF) 4 MG/ML IV SOLN
INTRAVENOUS | Status: AC
  Filled 2017-12-02: qty 1

## 2017-12-02 MED ORDER — SODIUM CHLORIDE 0.9 % IV SOLN
200.0000 mg | Freq: Once | INTRAVENOUS | Status: AC
  Administered 2017-12-02: 200 mg via INTRAVENOUS
  Filled 2017-12-02: qty 8

## 2017-12-02 MED ORDER — SODIUM CHLORIDE 0.9% FLUSH
10.0000 mL | INTRAVENOUS | Status: DC | PRN
  Administered 2017-12-02: 10 mL
  Filled 2017-12-02: qty 10

## 2017-12-02 MED ORDER — MORPHINE SULFATE 4 MG/ML IJ SOLN
2.0000 mg | Freq: Once | INTRAMUSCULAR | Status: AC
  Administered 2017-12-02: 2 mg via INTRAVENOUS
  Filled 2017-12-02: qty 1

## 2017-12-02 MED ORDER — SODIUM CHLORIDE 0.9 % IV SOLN
Freq: Once | INTRAVENOUS | Status: AC
  Administered 2017-12-02: 09:00:00 via INTRAVENOUS
  Filled 2017-12-02: qty 250

## 2017-12-02 MED ORDER — MORPHINE SULFATE 4 MG/ML IJ SOLN
2.0000 mg | Freq: Once | INTRAMUSCULAR | Status: DC
Start: 2017-12-02 — End: 2017-12-02
  Filled 2017-12-02: qty 1

## 2017-12-02 MED ORDER — HEPARIN SOD (PORK) LOCK FLUSH 100 UNIT/ML IV SOLN
500.0000 [IU] | Freq: Once | INTRAVENOUS | Status: AC | PRN
  Administered 2017-12-02: 500 [IU]
  Filled 2017-12-02: qty 5

## 2017-12-02 NOTE — Assessment & Plan Note (Signed)
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

## 2017-12-02 NOTE — Patient Instructions (Signed)
[facility] Discharge Instructions for Patients Receiving Chemotherapy  Today you received the following chemotherapy agents:  Keytruda.  To help prevent nausea and vomiting after your treatment, we encourage you to take your nausea medication as directed.   If you develop nausea and vomiting that is not controlled by your nausea medication, call the clinic.   BELOW ARE SYMPTOMS THAT SHOULD BE REPORTED IMMEDIATELY:  *FEVER GREATER THAN 100.5 F  *CHILLS WITH OR WITHOUT FEVER  NAUSEA AND VOMITING THAT IS NOT CONTROLLED WITH YOUR NAUSEA MEDICATION  *UNUSUAL SHORTNESS OF BREATH  *UNUSUAL BRUISING OR BLEEDING  TENDERNESS IN MOUTH AND THROAT WITH OR WITHOUT PRESENCE OF ULCERS  *URINARY PROBLEMS  *BOWEL PROBLEMS  UNUSUAL RASH Items with * indicate a potential emergency and should be followed up as soon as possible.  Feel free to call the clinic should you have any questions or concerns. The clinic phone number is [phone].  Please show the CHEMO ALERT CARD at check-in to the Emergency Department and triage nurse.    

## 2017-12-02 NOTE — Assessment & Plan Note (Signed)
She had recent diarrhea of unknown etiology I recommend Imodium as needed and increase hydration

## 2017-12-02 NOTE — Progress Notes (Signed)
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[place]) I recommend spacing out her treatment to once a month but keeping at the same dose She tolerated treatment well except for mild neuropathy  She will continue Lupron injection to suppress ovarian function I plan to repeat another imaging in September  Chronic joint pain She has multiple chronic joint pain and is dependent on tramadol to control her joint pain She is taking vitamin D supplement as prescribed  Abnormal liver function tests She has intermittent elevated liver enzymes secondary to Gilbert's syndrome Observe only  Diarrhea She had recent diarrhea of unknown etiology I recommend Imodium as needed and increase hydration    Orders Placed This Encounter  Procedures  . NM PET Image Restag (PS) Skull Base To Thigh    Standing Status:   Future    Standing Expiration Date:   12/03/2018    Order Specific Question:   If indicated for the ordered procedure, I authorize the administration of a radiopharmaceutical per Radiology protocol    Answer:   Yes    Order Specific Question:   Preferred imaging location?    Answer:   [facility]    Order Specific Question:   Radiology Contrast Protocol - do NOT remove file path    Answer:   [email or web address]    Order Specific Question:   Is the patient pregnant?    Answer:   Yes    INTERVAL HISTORY: Please see below for problem oriented charting. 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    05/07/2014 Imaging    CT scan of the chest show right thoracic inlet lymphadenopathy    06/07/2014 Procedure    She underwent ultrasound-guided core biopsy of the neck lymph node    06/07/2014 Pathology Results    Accession: [id number] biopsy confirmed diagnosis of Hodgkin lymphoma.    06/15/2014 Imaging    Echocardiogram showed preserved ejection fraction    07/09/2014 - 07/12/2014 Hospital Admission    She was admitted to the hospital for severe anemia.    07/27/2014 Procedure    She had placement of port    07/31/2014 - 09/11/2014 Chemotherapy    She received dose adjusted chemotherapy due to abnormal liver function tests and severe anemia. Treatment was delayed due to noncompliance  and subsequently stopped because the patient failed to keep appointments    01/11/2015 Imaging    Repeat PET CT scan showed response to treatment    01/28/2015 - 06/18/2015 Chemotherapy    ABVD was restarted with full dose.    02/08/2015 - 02/10/2015 Hospital Admission    The patient was admitted to the hospital due to pancytopenia and profuse diarrhea. Cultures were negative. She was placed on ciprofloxacin.    02/11/2015 Adverse Reaction  Treatment was placed on hold due to recent infection.    04/11/2015 Imaging    PET CT scan showed near complete response. Incidental finding of an abnormal bone lesion, indeterminate. She is not symptomatic. Recommendation from [facility] to observe    07/11/2015 Imaging    PET CT scan showed abnormal new bone lesions, suggestive of possible disease progression    07/23/2015 Bone Marrow Biopsy    She underwent bone  biopsy    07/23/2015 Pathology Results    Accession: [id number]  biopsy was negative for cancer    11/21/2015 Surgery    She had surgery for ectopic pregnancy    01/29/2016 Imaging    Ct chest, abdomen and pelvis showed pelvic and retroperitoneal lymphadenopathy, as above, concerning for residual disease. There is also a mildly enlarged posterior mediastinal lymph node measuring 11 mm adjacent to the distal descending thoracic aorta. This may represent an additional focus of disease, but is the only finding of concern in the thorax on today's examination. Sclerosis in the right ilium at site of previously noted metabolically active lesion, grossly unchanged. No other definite osseous lesions are identified on today's examination. Spleen is normal in size and appearance.    02/14/2016 PET scan    Interval disease worsening with new foci of hypermetabolic activity in multiple retroperitoneal and pelvic lymph nodes as well as AP window and left hilar lymph nodes. (Deauville 5). There is also overall worsening of the osseous disease.    05/11/2016 Pathology Results    Diagnosis Lymph node, needle/core biopsy, Left para-aortic retroperitoneal - CLASSICAL HODGKIN LYMPHOMA. - SEE ONCOLOGY TABLE. Microscopic Comment LYMPHOMA Histologic type: Classical Hodgkin lymphoma. Grade (if applicable): N/A Flow cytometry: Not done. Immunohistochemical stains: CD15, CD20, CD3, LCA, PAX-5, CD30 with appropriate controls. Touch preps/imprints: Not performed. Comments: The sections show small needle core biopsy fragments displaying a polymorphous cellular proliferation of small lymphocytes, plasma cells, eosinophils, and large atypical mononuclear and multilobated lymphoid cells with features of Reed-Sternberg cells and variants. This is associated with patchy areas of fibrosis. Immunohistochemical stains were performed and show that the large atypical lymphoid cells are positive for CD30, CD15 and PAX-5 and negative  for LCA, CD20, CD3. The small lymphoid cells in the background show a mixture of T and B cells with predominance of T cells. The overall morphologic and histologic features are consistent with classical Hodgkin lymphoma. Further subtyping is challenging in limited small biopsy fragments but the patchy fibrosis suggests nodular sclerosis type.     05/11/2016 Procedure    She underwent CT guided biopsy of retroperitoneal lymph node    05/26/2016 Procedure    Successful placement of a right IJ approach Power Port with ultrasound and fluoroscopic guidance. The catheter is ready for use.    06/02/2016 PET scan    Mixed response to chemotherapy with some lymph nodes decreased in metabolic activity and some lymph nodes increase metabolic activity. Lymph node stations including mediastinum, periaortic retroperitoneum, and obturator node stations. Activity is remains relatively intense Deauville 4 2. LEFT infrahilar nodule / lymph node with intense metabolic activity decreased from prior. ( Deauville 4 ). 3. 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 Deauville 5    06/03/2016 - 06/05/2016 Hospital Admission    She was admitted to the hospital for cycle 1 of ICE chemotherapy    06/24/2016 - 06/26/2016 Hospital Admission    She received cycle 2 of ICE chemo    07/07/2016 PET scan    Resolution of  prior hypermetabolic adenopathy and resolution of prior osseous foci of hypermetabolic activity compatible with essentially complete response to therapy (Deauville 1). 2. Generalized reduced activity in the L4 vertebral body and the type of finding which would typically reflect prior radiation therapy. 3. Stable septated fatty right pelvic lesion, possibly a dermoid or lipoma, not hypermetabolic    5/78/4696 PET scan    Hypermetabolic lesion along the L3 vertebral body and left posterior elements, max SUV 7.8 (Deauville 5). Hypermetabolic lesion along the left inferior pubic ramus, max SUV 4.9  (Deauville 4).  IMPRESSION: Prevascular lymphadenopathy, reflecting nodal recurrence (Deauville 4). Hypermetabolic osseous metastases involving the L3 vertebral body/posterior elements and left inferior pubic ramus (Deauville 4-5). Hypermetabolism along the endometrium, new, possibly reactive/physiologic. Consider pelvic ultrasound and/or endometrial sampling as clinically warranted.    11/04/2016 - 02/24/2017 Chemotherapy    She received Brentuximab    12/11/2016 PET scan    1. Mixed response to therapy within the skeleton. Lesions at L3 is decreased in size and metabolic activity. Residual activity remains above liver ( Deauville 4) 2. Increased activity in the LEFT sacrum with metabolic activity above liver activity ( Deauville 4 3. Decrease in size and metabolic activity of anterior mediastinal tissue consistent with resolution of thymic hyperplasia or resolution of lymphoma metabolic activity ( Deauville 2). 4. No new lymphadenopathy. Normal spleen and liver.    03/25/2017 PET scan    1. Increased metabolic activity in several normal sized retroperitoneal and right pelvic lymph nodes, Deauville 4. 2. Increase in size and metabolic activity of lesions in the L3 and L4 vertebral bodies and in the left sacrum, Deauville 5. 3. No recurrence of anterior mediastinal abnormal activity. 4. Other imaging findings of potential clinical significance: Chronic ethmoid and left maxillary sinusitis. Paraseptal emphysema at the lung apices. Right ovarian dermoid.    04/06/2017 -  Chemotherapy    The patient had Keytruda    06/09/2017 PET scan    1. Overall significant improvement with reduction in nodal activity. Several retroperitoneal nodes which were previously Deauville 4 are currently Deauville 3. The right common iliac lymph node remains at Deauville 4 with maximum SUV of 3.4, but is improved from prior SUV of 4.3. 2. The bony lesions show the greatest improvement, with the previously Deauville 5  hypermetabolic lesions currently no longer of higher metabolic activity than surrounding bone marrow, currently measuring at Deauville 3. 3. 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 4. No new lesions are identified. 5. Emphysema (ICD10-J43.9).    08/05/2017 Imaging    1. Somewhat complex left adnexal lesion, potentially hemorrhagic cyst measuring 2.9 x 2.8 cm. The possibility of torsion in the left ovary must be considered. Advise correlation with pelvic ultrasound including Doppler assessment to further evaluate.  2.  Right ovarian dermoid, unchanged from recent PET-CT examination.  3. No adenopathy by size criteria evident. Appearance of subcentimeter mesenteric lymph nodes is stable compared to recent study.  4. No bone lesions appreciable by CT. Areas of abnormal radiotracer uptake in the mid lumbar spine noted on recent PET-CT examination. No destruction 2 or lytic change noted in the lumbar vertebrae currently there.  5. No bowel obstruction. No abscess. No periappendiceal region inflammation.  6.  Small hiatal hernia with fluid in distal esophagus.  7.  Foci of coronary artery calcification, advanced for age.  8.  Spleen normal in size and contour.  9. Small hiatal hernia with mild fluid in the distal esophagus, likely indicative of  a degree of reflux.     REVIEW OF SYSTEMS:   Constitutional: Denies fevers, chills or abnormal weight loss Eyes: Denies blurriness of vision Ears, nose, mouth, throat, and face: Denies mucositis or sore throat Respiratory: Denies cough, dyspnea or wheezes Cardiovascular: Denies palpitation, chest discomfort or lower extremity swelling Gastrointestinal:  Denies nausea, heartburn or change in bowel habits Skin: Denies abnormal skin rashes Lymphatics: Denies new lymphadenopathy or easy bruising Neurological:Denies numbness, tingling or new weaknesses Behavioral/Psych: Mood is stable, no new changes   All other systems were reviewed with the patient and are negative.  I have reviewed the past medical history, past surgical history, social history and family history with the patient and they are unchanged from previous note.  ALLERGIES:  is allergic to temazepam.  MEDICATIONS:  Current Outpatient Medications  Medication Sig Dispense Refill  . atazanavir (REYATAZ) 300 MG capsule Take 1 capsule (300 mg total) by mouth daily with breakfast. Take with Norvir. 30 capsule 5  . cholecalciferol (VITAMIN D) 1000 units tablet Take 1 tablet (1,000 Units total) by mouth daily. 30 tablet 9  . emtricitabine-tenofovir (TRUVADA) 200-300 MG tablet Take 1 tablet by mouth daily. 30 tablet 5  . fluconazole (DIFLUCAN) 150 MG tablet Take 1 tablet (150 mg total) by mouth every 3 (three) days as needed for up to 3 doses. 3 tablet 0  . HYDROcodone-acetaminophen (NORCO/VICODIN) 5-325 MG tablet TK 1 T PO Q 4 TO 6 H PRF PAIN  0  . ibuprofen (ADVIL,MOTRIN) 600 MG tablet Take 1 tablet (600 mg total) by mouth every 6 (six) hours as needed. (Patient not taking: Reported on 11/30/2017) 30 tablet 0  . lidocaine-prilocaine (EMLA) cream Apply 1 application topically as needed. 30 g 11  . ondansetron (ZOFRAN ODT) 8 MG disintegrating tablet Take 1 tablet (8 mg total) by mouth every 8 (eight) hours as needed for nausea. 20 tablet 0  . Polyvinyl Alcohol-Povidone (CLEAR EYES ALL SEASONS OP) Place 1 drop into both eyes daily.    . ritonavir (NORVIR) 100 MG TABS tablet Take 1 tablet by mouth every morning with breakfast. Take with Reyataz. 30 tablet 5  . traMADol (ULTRAM) 50 MG tablet Take 2 tablets (100 mg total) by mouth every 6 (six) hours as needed for moderate pain. 90 tablet 0  . triamcinolone cream (KENALOG) 0.1 % Apply 1 application topically 2 (two) times daily. (Patient taking differently: Apply 1 application topically 2 (two) times daily as needed (skin irritation). ) 30 g 0   No current facility-administered medications  for this visit.    Facility-Administered Medications Ordered in Other Visits  Medication Dose Route Frequency Provider Last Rate Last Dose  . sodium chloride flush (NS) 0.9 % injection 10 mL  10 mL Intracatheter PRN Alvy Bimler, Madyson Lukach, MD   10 mL at 12/02/17 1200    PHYSICAL EXAMINATION: ECOG PERFORMANCE STATUS: 1 - Symptomatic but completely ambulatory  Vitals:   12/02/17 0827  BP: 108/77  Pulse: 77  Resp: 18  Temp: 98.3 F (36.8 C)  SpO2: 100%   Filed Weights   12/02/17 0827  Weight: 121 lb (54.9 kg)    GENERAL:alert, no distress and comfortable SKIN: skin color, texture, turgor are normal, no rashes or significant lesions EYES: normal, Conjunctiva are pink and non-injected, sclera clear OROPHARYNX:no exudate, no erythema and lips, buccal mucosa, and tongue normal  NECK: supple, thyroid normal size, non-tender, without nodularity LYMPH:  no palpable lymphadenopathy in the cervical, axillary or inguinal LUNGS: clear to auscultation and percussion  with normal breathing effort HEART: regular rate & rhythm and no murmurs and no lower extremity edema ABDOMEN:abdomen soft, non-tender and normal bowel sounds Musculoskeletal:no cyanosis of digits and no clubbing  NEURO: alert & oriented x 3 with fluent speech, no focal motor/sensory deficits  LABORATORY DATA:  I have reviewed the data as listed    Component Value Date/Time   NA 140 12/02/2017 0739   NA 140 04/29/2017 0745   K 3.4 (L) 12/02/2017 0739   K 3.3 (L) 04/29/2017 0745   CL 106 12/02/2017 0739   CO2 22 12/02/2017 0739   CO2 23 04/29/2017 0745   GLUCOSE 86 12/02/2017 0739   GLUCOSE 83 04/29/2017 0745   BUN 7 12/02/2017 0739   BUN 10.7 04/29/2017 0745   CREATININE 1.06 (H) 12/02/2017 0739   CREATININE 1.2 (H) 04/29/2017 0745   CALCIUM 9.8 12/02/2017 0739   CALCIUM 9.8 04/29/2017 0745   PROT 7.7 12/02/2017 0739   PROT 7.6 04/29/2017 0745   ALBUMIN 4.4 12/02/2017 0739   ALBUMIN 4.0 04/29/2017 0745   AST 18 12/02/2017  0739   AST 19 04/29/2017 0745   ALT 19 12/02/2017 0739   ALT 19 04/29/2017 0745   ALKPHOS 83 12/02/2017 0739   ALKPHOS 81 04/29/2017 0745   BILITOT 2.2 (H) 12/02/2017 0739   BILITOT 1.31 (H) 04/29/2017 0745   GFRNONAA >60 12/02/2017 0739   GFRNONAA 83 02/20/2016 0908   GFRAA >60 12/02/2017 0739   GFRAA >89 02/20/2016 0908    No results found for: SPEP, UPEP  Lab Results  Component Value Date   WBC 6.6 12/02/2017   NEUTROABS 3.8 12/02/2017   HGB 14.1 12/02/2017   HCT 40.2 12/02/2017   MCV 102.3 (H) 12/02/2017   PLT 307 12/02/2017      Chemistry      Component Value Date/Time   NA 140 12/02/2017 0739   NA 140 04/29/2017 0745   K 3.4 (L) 12/02/2017 0739   K 3.3 (L) 04/29/2017 0745   CL 106 12/02/2017 0739   CO2 22 12/02/2017 0739   CO2 23 04/29/2017 0745   BUN 7 12/02/2017 0739   BUN 10.7 04/29/2017 0745   CREATININE 1.06 (H) 12/02/2017 0739   CREATININE 1.2 (H) 04/29/2017 0745      Component Value Date/Time   CALCIUM 9.8 12/02/2017 0739   CALCIUM 9.8 04/29/2017 0745   ALKPHOS 83 12/02/2017 0739   ALKPHOS 81 04/29/2017 0745   AST 18 12/02/2017 0739   AST 19 04/29/2017 0745   ALT 19 12/02/2017 0739   ALT 19 04/29/2017 0745   BILITOT 2.2 (H) 12/02/2017 0739   BILITOT 1.31 (H) 04/29/2017 0745      All questions were answered. The patient knows to call the clinic with any problems, questions or concerns. No barriers to learning was detected.  I spent 15 minutes counseling the patient face to face. The total time spent in the appointment was 20 minutes and more than 50% was on counseling and review of test results  Heath Lark, MD 12/02/2017 3:24 PM

## 2017-12-02 NOTE — Progress Notes (Signed)
Upon the completion of infusion as the pt is being deaccessed, pt reports a headache and sore throat. Pt reports that headache is "10/10 and throat is burning". Per pt, she has headache after her infusions but never as intense. Sandi Mealy, PA to chairside to assess. MMorphine sulfate admionistered per Sandi Mealy and pt monitored for 24minutes after. Pt advised not to drive. Pt voiced understanding. Pt discharged to home with family member via wheelchair

## 2017-12-02 NOTE — Assessment & Plan Note (Signed)
I recommend spacing out her treatment to once a month but keeping at the same dose She tolerated treatment well except for mild neuropathy  She will continue Lupron injection to suppress ovarian function I plan to repeat another imaging in September

## 2017-12-02 NOTE — Progress Notes (Signed)
MD ok'd tx today w/ Tbili = 2. Kennith Center, Pharm.D., CPP 12/02/2017@9 :22 AM

## 2017-12-02 NOTE — Assessment & Plan Note (Signed)
She has multiple chronic joint pain and is dependent on tramadol to control her joint pain She is taking vitamin D supplement as prescribed

## 2017-12-03 ENCOUNTER — Other Ambulatory Visit: Payer: Self-pay

## 2017-12-03 ENCOUNTER — Telehealth: Payer: Self-pay | Admitting: Hematology and Oncology

## 2017-12-03 ENCOUNTER — Telehealth: Payer: Self-pay

## 2017-12-03 DIAGNOSIS — B20 Human immunodeficiency virus [HIV] disease: Secondary | ICD-10-CM

## 2017-12-03 MED ORDER — TRAMADOL HCL 50 MG PO TABS: 100.0000 mg | ORAL_TABLET | Freq: Four times a day (QID) | ORAL | 0 refills | Status: DC | PRN

## 2017-12-03 NOTE — Telephone Encounter (Signed)
Spoke to patient regarding upcoming sept appts.

## 2017-12-03 NOTE — Telephone Encounter (Signed)
She called requesting refill on Tramadol Rx.

## 2017-12-03 NOTE — Telephone Encounter (Signed)
Called and told Rx ready for pick up. She verbalized understanding. 

## 2017-12-05 MED FILL — traMADol HCL 50 MG TABS: 50 | 12 days supply | Qty: 90 | Fill #0

## 2017-12-06 ENCOUNTER — Telehealth: Payer: Self-pay

## 2017-12-06 NOTE — Progress Notes (Signed)
[facility] Progress Note   [name] [id number] [birth year] 32 y.o.  [name] is managed by Dr. 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  Based on this the patient was given 2 mg of morphine IV as she stated her headache was 10/10.  The patient's headache resolved and she was able to be released home.  Nodular sclerosing Hodgkin's lymphoma: The patient continues to be managed by Dr. Heath Lark and has been treated with Beryle Flock most recently.  She is receiving cycle 11, day 1 of therapy today.  She will return to see Dr. Heath Lark on 12/30/2017.  Please see After Visit Summary for patient specific instructions.  Future Appointments  Date Time Provider [place]  12/29/2017  8:15 AM [facility] LAB 5 [facility] None  12/29/2017  8:30 AM [facility] [place] FLUSH [facility] None  12/30/2017  8:00 AM Heath Lark, MD [facility] None  12/30/2017  9:00 AM [facility] INFUSION [facility] None  01/11/2018  8:45 AM [place], Okey Regal, MD [facility]    No orders of the defined types were placed in this encounter.      Subjective:   Patient ID:  [name] is a 32 y.o. (DOB 1985-10-01) female.  Chief Complaint: No chief complaint on file.   HPI [name] is a 32 year old female with a nodular sclerosing Hodgkin's lymphoma who is managed by Dr. Heath Lark and who is receiving cycle 11, day 1 of Keytruda today.  The patient reports that she has had headaches with her Keytruda dosing in the past.  Her headache today is described as  10/10.  She denies any visual changes or weakness.  She has no fevers, chills, sweats, nausea, or vomiting.  Medications: I have reviewed the patient's current medications.  Allergies:  Allergies  Allergen Reactions  . Temazepam Other (See Comments)    Reaction:  Confusion/dizziness     Past Medical History:  Diagnosis Date  . AIN III (anal intraepithelial neoplasia III)   . Anemia   . Cancer ([place])    Hodgkin lymphoma  . Chest wall pain 06/27/2015  . Condyloma acuminatum in female   . Depression   . History of chronic bronchitis   . History of esophagitis    CANDIDA  . History of shingles   . HIV (human immunodeficiency virus infection) ([place])   . Hodgkin's lymphoma ([place]) 06/12/2014  . HSV (herpes simplex virus) infection   . Hypokalemia 07/17/2014  . Periodontitis, chronic   . Screening examination for venereal disease 10/30/2013    Past Surgical History:  Procedure Laterality Date  . DIAGNOSTIC LAPAROSCOPY WITH REMOVAL OF ECTOPIC PREGNANCY N/A 11/17/2015   Procedure: LAPAROSCOPY LEFT  SALPINGECTOMY SECONDARY TO LEFT ECTOPIC PREGNANCY;  Surgeon: Jonnie Kind, MD;  Location: [facility];  Service: Gynecology;  Laterality: N/A;  . DILATION AND CURETTAGE OF UTERUS  2005   MISSED AB  . EXAMINATION UNDER ANESTHESIA N/A 09/23/2012   Procedure: EXAM UNDER ANESTHESIA;  Surgeon: Adin Hector, MD;  Location: [facility];  Service: General;  Laterality: N/A;  . IR GENERIC HISTORICAL  05/26/2016  IR FLUORO GUIDE PORT INSERTION RIGHT 05/26/2016 [facility]  . IR GENERIC HISTORICAL  05/26/2016   IR US GUIDE VASC ACCESS RIGHT 05/26/2016 [facility]  . LASER ABLATION CONDOLAMATA N/A 09/23/2012   Procedure: REMOVAL/ABLATION  ABLATION CONDOLAMATA WARTS;  Surgeon: Adin Hector, MD;  Location: [facility];  Service: General;  Laterality: N/A;    Family History  Problem Relation Age of Onset  . Cancer Maternal Aunt        unknown ca  . Cancer Maternal  Grandmother        unknown ca    Social History   Socioeconomic History  . Marital status: Single    Spouse name: Not on file  . Number of children: Not on file  . Years of education: Not on file  . Highest education level: Not on file  Occupational History  . Not on file  Social Needs  . Financial resource strain: Not on file  . Food insecurity:    Worry: Not on file    Inability: Not on file  . Transportation needs:    Medical: Not on file    Non-medical: Not on file  Tobacco Use  . Smoking status: Light Tobacco Smoker    Packs/day: 0.25    Years: 7.00    Pack years: 1.75    Types: Cigars, Cigarettes    Start date: 03/19/2014  . Smokeless tobacco: Never Used  . Tobacco comment: she smokes 3 Black and Mild Cigars daily  Substance and Sexual Activity  . Alcohol use: Yes    Alcohol/week: 0.0 standard drinks    Comment: Occasionally  . Drug use: Yes    Types: Marijuana    Comment: 2 blunts per day  . Sexual activity: Yes    Partners: Male    Birth control/protection: None    Comment: pt. declined condoms  Lifestyle  . Physical activity:    Days per week: Not on file    Minutes per session: Not on file  . Stress: Not on file  Relationships  . Social connections:    Talks on phone: Not on file    Gets together: Not on file    Attends religious service: Not on file    Active member of club or organization: Not on file    Attends meetings of clubs or organizations: Not on file    Relationship status: Not on file  . Intimate partner violence:    Fear of current or ex partner: Not on file    Emotionally abused: Not on file    Physically abused: Not on file    Forced sexual activity: Not on file  Other Topics Concern  . Not on file  Social History Narrative  . Not on file    Past Medical History, Surgical history, Social history, and Family history were reviewed and updated as appropriate.   Please see review of systems for further details on the patient's  review from today.   Review of Systems:  Review of Systems  Constitutional: Negative for chills, diaphoresis and fever.  HENT: Negative for trouble swallowing and voice change.   Respiratory: Negative for cough, chest tightness, shortness of breath and wheezing.   Cardiovascular: Negative for chest pain and palpitations.  Gastrointestinal: Negative for abdominal pain, constipation, diarrhea, nausea and vomiting.  Musculoskeletal: Negative for back pain and myalgias.  Neurological: Positive for headaches. Negative for dizziness and light-headedness.    Objective:   Physical Exam:  There were no vitals  taken for this visit. ECOG: 0  Physical Exam  Constitutional: No distress.  HENT:  Head: Normocephalic and atraumatic.  Cardiovascular: Normal rate, regular rhythm and normal heart sounds. Exam reveals no gallop and no friction rub.  No murmur heard. Pulmonary/Chest: Effort normal and breath sounds normal. No respiratory distress. She has no wheezes. She has no rales.  Neurological: She is alert.  Skin: Skin is warm and dry. No rash noted. She is not diaphoretic. No erythema.    Lab Review:     Component Value Date/Time   NA 140 12/02/2017 0739   NA 140 04/29/2017 0745   K 3.4 (L) 12/02/2017 0739   K 3.3 (L) 04/29/2017 0745   CL 106 12/02/2017 0739   CO2 22 12/02/2017 0739   CO2 23 04/29/2017 0745   GLUCOSE 86 12/02/2017 0739   GLUCOSE 83 04/29/2017 0745   BUN 7 12/02/2017 0739   BUN 10.7 04/29/2017 0745   CREATININE 1.06 (H) 12/02/2017 0739   CREATININE 1.2 (H) 04/29/2017 0745   CALCIUM 9.8 12/02/2017 0739   CALCIUM 9.8 04/29/2017 0745   PROT 7.7 12/02/2017 0739   PROT 7.6 04/29/2017 0745   ALBUMIN 4.4 12/02/2017 0739   ALBUMIN 4.0 04/29/2017 0745   AST 18 12/02/2017 0739   AST 19 04/29/2017 0745   ALT 19 12/02/2017 0739   ALT 19 04/29/2017 0745   ALKPHOS 83 12/02/2017 0739   ALKPHOS 81 04/29/2017 0745   BILITOT 2.2 (H) 12/02/2017 0739   BILITOT 1.31 (H)  04/29/2017 0745   GFRNONAA >60 12/02/2017 0739   GFRNONAA 83 02/20/2016 0908   GFRAA >60 12/02/2017 0739   GFRAA >89 02/20/2016 0908       Component Value Date/Time   WBC 6.6 12/02/2017 0739   RBC 3.93 12/02/2017 0739   HGB 14.1 12/02/2017 0739   HGB 13.1 04/29/2017 0745   HCT 40.2 12/02/2017 0739   HCT 38.1 04/29/2017 0745   PLT 307 12/02/2017 0739   PLT 247 04/29/2017 0745   MCV 102.3 (H) 12/02/2017 0739   MCV 102.8 (H) 04/29/2017 0745   MCH 35.9 (H) 12/02/2017 0739   MCHC 35.1 12/02/2017 0739   RDW 13.2 12/02/2017 0739   RDW 15.8 (H) 04/29/2017 0745   LYMPHSABS 2.1 12/02/2017 0739   LYMPHSABS 1.2 04/29/2017 0745   MONOABS 0.5 12/02/2017 0739   MONOABS 0.5 04/29/2017 0745   EOSABS 0.2 12/02/2017 0739   EOSABS 0.2 04/29/2017 0745   BASOSABS 0.0 12/02/2017 0739   BASOSABS 0.1 04/29/2017 0745   -------------------------------  Imaging from last 24 hours (if applicable):  Radiology interpretation: No results found.

## 2017-12-06 NOTE — Telephone Encounter (Signed)
Patient called to see when her appointment was. Informed patient date and time of appointment. [place]

## 2017-12-14 ENCOUNTER — Telehealth: Payer: Self-pay

## 2017-12-14 ENCOUNTER — Other Ambulatory Visit: Payer: Self-pay

## 2017-12-14 DIAGNOSIS — B20 Human immunodeficiency virus [HIV] disease: Secondary | ICD-10-CM

## 2017-12-14 MED ORDER — TRAMADOL HCL 50 MG PO TABS: 100.0000 mg | ORAL_TABLET | Freq: Four times a day (QID) | ORAL | 0 refills | Status: DC | PRN

## 2017-12-14 NOTE — Telephone Encounter (Signed)
She called and left a message requesting refill on Tramadol Rx.  Called back and told Rx ready for pickup. She verbalized understanding.

## 2017-12-15 MED FILL — traMADol HCL 50 MG TABS: 50 | 12 days supply | Qty: 90 | Fill #0

## 2017-12-22 ENCOUNTER — Telehealth: Payer: Self-pay

## 2017-12-22 NOTE — Telephone Encounter (Signed)
Nutrition  Patient identified on Malnutrition Screening report for poor appetite and weight loss.  Called patient and left voice mail for patient to return RD call.  Braylon Lemmons B. Zenia Resides, [place], [place] Registered Dietitian [phone] (pager)

## 2017-12-24 ENCOUNTER — Other Ambulatory Visit: Payer: Self-pay | Admitting: Internal Medicine

## 2017-12-24 DIAGNOSIS — B20 Human immunodeficiency virus [HIV] disease: Secondary | ICD-10-CM

## 2017-12-29 ENCOUNTER — Other Ambulatory Visit: Payer: Self-pay | Admitting: Hematology and Oncology

## 2017-12-29 ENCOUNTER — Inpatient Hospital Stay: Payer: 59 | Attending: Hematology and Oncology

## 2017-12-29 ENCOUNTER — Encounter (HOSPITAL_COMMUNITY): Payer: 59

## 2017-12-29 ENCOUNTER — Inpatient Hospital Stay: Payer: 59

## 2017-12-29 ENCOUNTER — Telehealth: Payer: Self-pay | Admitting: *Deleted

## 2017-12-29 DIAGNOSIS — B20 Human immunodeficiency virus [HIV] disease: Secondary | ICD-10-CM

## 2017-12-29 DIAGNOSIS — D270 Benign neoplasm of right ovary: Secondary | ICD-10-CM | POA: Insufficient documentation

## 2017-12-29 DIAGNOSIS — C8118 Nodular sclerosis classical Hodgkin lymphoma, lymph nodes of multiple sites: Secondary | ICD-10-CM

## 2017-12-29 DIAGNOSIS — M1990 Unspecified osteoarthritis, unspecified site: Secondary | ICD-10-CM | POA: Insufficient documentation

## 2017-12-29 DIAGNOSIS — G893 Neoplasm related pain (acute) (chronic): Secondary | ICD-10-CM | POA: Insufficient documentation

## 2017-12-29 DIAGNOSIS — Z5111 Encounter for antineoplastic chemotherapy: Secondary | ICD-10-CM | POA: Diagnosis present

## 2017-12-29 DIAGNOSIS — Z79899 Other long term (current) drug therapy: Secondary | ICD-10-CM | POA: Diagnosis not present

## 2017-12-29 DIAGNOSIS — R634 Abnormal weight loss: Secondary | ICD-10-CM | POA: Insufficient documentation

## 2017-12-29 DIAGNOSIS — Z95828 Presence of other vascular implants and grafts: Secondary | ICD-10-CM

## 2017-12-29 DIAGNOSIS — Z5112 Encounter for antineoplastic immunotherapy: Secondary | ICD-10-CM | POA: Insufficient documentation

## 2017-12-29 DIAGNOSIS — C8198 Hodgkin lymphoma, unspecified, lymph nodes of multiple sites: Secondary | ICD-10-CM

## 2017-12-29 LAB — COMPREHENSIVE METABOLIC PANEL
ALT: 17 U/L (ref 0–44)
AST: 15 U/L (ref 15–41)
Albumin: 4.3 g/dL (ref 3.5–5.0)
Alkaline Phosphatase: 76 U/L (ref 38–126)
Anion gap: 10 (ref 5–15)
BUN: 10 mg/dL (ref 6–20)
CHLORIDE: 106 mmol/L (ref 98–111)
CO2: 24 mmol/L (ref 22–32)
Calcium: 10.2 mg/dL (ref 8.9–10.3)
Creatinine, Ser: 0.98 mg/dL (ref 0.44–1.00)
Glucose, Bld: 97 mg/dL (ref 70–99)
POTASSIUM: 3.6 mmol/L (ref 3.5–5.1)
SODIUM: 140 mmol/L (ref 135–145)
Total Bilirubin: 1.5 mg/dL — ABNORMAL HIGH (ref 0.3–1.2)
Total Protein: 7.4 g/dL (ref 6.5–8.1)

## 2017-12-29 LAB — CBC WITH DIFFERENTIAL/PLATELET
Basophils Absolute: 0.1 10*3/uL (ref 0.0–0.1)
Basophils Relative: 1 %
Eosinophils Absolute: 0.1 10*3/uL (ref 0.0–0.5)
Eosinophils Relative: 1 %
HCT: 41.1 % (ref 34.8–46.6)
HEMOGLOBIN: 14.3 g/dL (ref 11.6–15.9)
LYMPHS ABS: 1.6 10*3/uL (ref 0.9–3.3)
LYMPHS PCT: 19 %
MCH: 36.1 pg — ABNORMAL HIGH (ref 25.1–34.0)
MCHC: 34.8 g/dL (ref 31.5–36.0)
MCV: 103.7 fL — AB (ref 79.5–101.0)
Monocytes Absolute: 0.6 10*3/uL (ref 0.1–0.9)
Monocytes Relative: 7 %
NEUTROS PCT: 72 %
Neutro Abs: 5.8 10*3/uL (ref 1.5–6.5)
PLATELETS: 250 10*3/uL (ref 145–400)
RBC: 3.96 MIL/uL (ref 3.70–5.45)
RDW: 13.7 % (ref 11.2–14.5)
WBC: 8 10*3/uL (ref 3.9–10.3)

## 2017-12-29 LAB — TSH: TSH: 1.689 u[IU]/mL (ref 0.308–3.960)

## 2017-12-29 MED ORDER — HEPARIN SOD (PORK) LOCK FLUSH 100 UNIT/ML IV SOLN
500.0000 [IU] | Freq: Once | INTRAVENOUS | Status: AC | PRN
  Administered 2017-12-29: 500 [IU]
  Filled 2017-12-29: qty 5

## 2017-12-29 MED ORDER — SODIUM CHLORIDE 0.9% FLUSH
10.0000 mL | INTRAVENOUS | Status: DC | PRN
  Administered 2017-12-29: 10 mL via INTRAVENOUS
  Filled 2017-12-29: qty 10

## 2017-12-29 MED ORDER — TRAMADOL HCL 50 MG PO TABS: 100.0000 mg | ORAL_TABLET | Freq: Four times a day (QID) | ORAL | 0 refills | Status: DC | PRN

## 2017-12-29 NOTE — Telephone Encounter (Signed)
I have ordered PET scan for today which was cancelled She will suffer from toxicity if she takes more than 10 tabs of tramadol a day; maximum is 8 tabs per day I will discuss pain management with her tomorrow

## 2017-12-29 NOTE — Telephone Encounter (Signed)
Pt came by office after labs done & requested refill on her tramadol.  She was upset that her labs were today & treatment is tomorrow.  Reviewed chart & explained that Dr Alvy Bimler ordered appts this way so that labs would be back for her to review & then see her to discuss before treatment.  Pt reports that she takes @ 3 tramadol every 3 hours.  She states she takes at least 10 tabs daily.  Last refill was on 8/20 for 90 tabs.  She states she is out.  Informed that this will be discussed with Dr Alvy Bimler.  Message routed to her.

## 2017-12-30 ENCOUNTER — Encounter: Payer: Self-pay | Admitting: Hematology and Oncology

## 2017-12-30 ENCOUNTER — Inpatient Hospital Stay (HOSPITAL_BASED_OUTPATIENT_CLINIC_OR_DEPARTMENT_OTHER): Payer: 59 | Admitting: Hematology and Oncology

## 2017-12-30 ENCOUNTER — Inpatient Hospital Stay: Payer: 59

## 2017-12-30 VITALS — BP 133/77 | HR 71 | Temp 98.5°F | Resp 18 | Ht 64.0 in | Wt 115.6 lb

## 2017-12-30 DIAGNOSIS — R634 Abnormal weight loss: Secondary | ICD-10-CM

## 2017-12-30 DIAGNOSIS — Z5112 Encounter for antineoplastic immunotherapy: Secondary | ICD-10-CM | POA: Diagnosis not present

## 2017-12-30 DIAGNOSIS — Z95828 Presence of other vascular implants and grafts: Secondary | ICD-10-CM

## 2017-12-30 DIAGNOSIS — G893 Neoplasm related pain (acute) (chronic): Secondary | ICD-10-CM | POA: Diagnosis not present

## 2017-12-30 DIAGNOSIS — B20 Human immunodeficiency virus [HIV] disease: Secondary | ICD-10-CM | POA: Diagnosis not present

## 2017-12-30 DIAGNOSIS — C8118 Nodular sclerosis classical Hodgkin lymphoma, lymph nodes of multiple sites: Secondary | ICD-10-CM | POA: Diagnosis not present

## 2017-12-30 DIAGNOSIS — C8198 Hodgkin lymphoma, unspecified, lymph nodes of multiple sites: Secondary | ICD-10-CM

## 2017-12-30 MED ORDER — SODIUM CHLORIDE 0.9 % IV SOLN
Freq: Once | INTRAVENOUS | Status: AC
  Administered 2017-12-30: 09:00:00 via INTRAVENOUS
  Filled 2017-12-30: qty 250

## 2017-12-30 MED ORDER — SODIUM CHLORIDE 0.9% FLUSH
10.0000 mL | INTRAVENOUS | Status: DC | PRN
  Administered 2017-12-30: 10 mL
  Filled 2017-12-30: qty 10

## 2017-12-30 MED ORDER — LEUPROLIDE ACETATE 7.5 MG IM KIT
7.5000 mg | PACK | Freq: Once | INTRAMUSCULAR | Status: AC
  Administered 2017-12-30: 7.5 mg via INTRAMUSCULAR
  Filled 2017-12-30: qty 7.5

## 2017-12-30 MED ORDER — HEPARIN SOD (PORK) LOCK FLUSH 100 UNIT/ML IV SOLN
500.0000 [IU] | Freq: Once | INTRAVENOUS | Status: AC | PRN
  Administered 2017-12-30: 500 [IU]
  Filled 2017-12-30: qty 5

## 2017-12-30 MED ORDER — SODIUM CHLORIDE 0.9 % IV SOLN
200.0000 mg | Freq: Once | INTRAVENOUS | Status: AC
  Administered 2017-12-30: 200 mg via INTRAVENOUS
  Filled 2017-12-30: qty 8

## 2017-12-30 MED FILL — traMADol HCL 50 MG TABS: 50 | 12 days supply | Qty: 90 | Fill #0

## 2017-12-30 NOTE — Patient Instructions (Signed)
[facility] Discharge Instructions for Patients Receiving Chemotherapy  Today you received the following chemotherapy agents :  Keytruda, Lupron.  To help prevent nausea and vomiting after your treatment, we encourage you to take your nausea medication as prescribed.   If you develop nausea and vomiting that is not controlled by your nausea medication, call the clinic.   BELOW ARE SYMPTOMS THAT SHOULD BE REPORTED IMMEDIATELY:  *FEVER GREATER THAN 100.5 F  *CHILLS WITH OR WITHOUT FEVER  NAUSEA AND VOMITING THAT IS NOT CONTROLLED WITH YOUR NAUSEA MEDICATION  *UNUSUAL SHORTNESS OF BREATH  *UNUSUAL BRUISING OR BLEEDING  TENDERNESS IN MOUTH AND THROAT WITH OR WITHOUT PRESENCE OF ULCERS  *URINARY PROBLEMS  *BOWEL PROBLEMS  UNUSUAL RASH Items with * indicate a potential emergency and should be followed up as soon as possible.  Feel free to call the clinic should you have any questions or concerns. The clinic phone number is [phone].  Please show the CHEMO ALERT CARD at check-in to the Emergency Department and triage nurse.   

## 2017-12-31 ENCOUNTER — Telehealth: Payer: Self-pay | Admitting: Hematology and Oncology

## 2017-12-31 ENCOUNTER — Encounter: Payer: Self-pay | Admitting: Hematology and Oncology

## 2017-12-31 ENCOUNTER — Telehealth: Payer: Self-pay

## 2017-12-31 NOTE — Telephone Encounter (Signed)
Pt scheduled for CT scan on Tues 9/10 at 0830.  Arrival time 61.  NPO after MN.  Drink 1st bottle of contrast at 0630 and second bottle at 0730 on morning of procedure.  Pt will need to pick up contrast at [facility].  All these instructions left on pt's voicemail along with number to Central Schedule if pt needs to call for clarification or to change appt.

## 2017-12-31 NOTE — Assessment & Plan Note (Signed)
She has severe cancer associated pain She is taking more tramadol than prescribed I do not recommend the patient to exceed 400 mg total dose per day I refilled her prescription and plan to order CT imaging for objective assessment of disease process If she has recurrence of disease, I plan to initiate narcotic prescription to treat her cancer pain

## 2017-12-31 NOTE — Assessment & Plan Note (Signed)
She will continue anti-retroviral treatment as directed Her last evaluation in May show minimum detectable viral load She has appointment to see infectious disease soon

## 2017-12-31 NOTE — Telephone Encounter (Signed)
Called regarding 9/11

## 2017-12-31 NOTE — Progress Notes (Signed)
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[facility]) The patient is symptomatic with significant epigastric lower abdominal pain, which was consistent with prior cancer associated pain Unfortunately, her insurance company will not approve PET CT scan I recommend we proceed with treatment this week and then order CT scan of the chest, abdomen and pelvis to restage her disease and she agreed to proceed  Cancer associated pain She has severe cancer associated pain She is taking more tramadol than prescribed I do not recommend the patient to exceed 400 mg total dose per day I refilled her prescription and plan to order CT imaging for objective assessment of disease process If she has recurrence of disease, I plan to initiate narcotic prescription to treat her cancer pain  Human immunodeficiency virus (HIV) disease ([place]) She will continue anti-retroviral treatment as directed Her last evaluation in May show minimum detectable viral load She has appointment to see infectious disease soon   Orders Placed This Encounter  Procedures  . CT ABDOMEN PELVIS W CONTRAST    Standing Status:   Future    Standing Expiration Date:   12/31/2018    Order Specific Question:   If indicated for the ordered procedure, I authorize the administration of contrast media per Radiology protocol    Answer:   Yes    Order Specific Question:   Preferred imaging location?    Answer:   [facility]    Order Specific Question:   Radiology Contrast Protocol - do NOT remove file path    Answer:    \\[email or web address]    Order Specific Question:   Is patient pregnant?    Answer:   No  . CT CHEST W CONTRAST    Standing Status:   Future    Standing Expiration Date:   12/31/2018    Order Specific Question:   If indicated for the ordered procedure, I authorize the administration of contrast media per Radiology protocol    Answer:   Yes    Order Specific Question:   Preferred imaging location?    Answer:   [facility]    Order Specific Question:   Radiology Contrast Protocol - do NOT remove file path    Answer:   \\[email or web address]    Order Specific Question:   Is patient pregnant?    Answer:   No    INTERVAL HISTORY: Please see below for problem oriented charting. She returns for chemotherapy follow-up Unfortunately, PET CT scan was canceled I had discussion with her insurance company but they refused to authorize PET CT scan due to lifetime limit of 3 PET scan Since last time I saw her, she has worsening epigastric/lower abdominal pain This is consistent with her prior cancer associated pain As a result, she is taking more tramadol than prescribed, up to 500 mg/day She denies nausea or constipation No new lymphadenopathy She has lost some weight since the last time I saw her  SUMMARY OF ONCOLOGIC HISTORY:   Hodgkin lymphoma, nodular sclerosis ([place])   05/06/2014 Imaging    CT scan of the abdomen show diffuse mesenteric lymphadenopathy.    05/07/2014 Imaging  CT scan of the chest show right thoracic inlet lymphadenopathy    06/07/2014 Procedure    She underwent ultrasound-guided core biopsy of the neck lymph node    06/07/2014 Pathology Results    Accession: [id number] biopsy confirmed diagnosis of Hodgkin lymphoma.    06/15/2014 Imaging    Echocardiogram showed preserved ejection fraction    07/09/2014 - 07/12/2014 Hospital Admission    She was admitted to the hospital for severe anemia.    07/27/2014 Procedure     She had placement of port    07/31/2014 - 09/11/2014 Chemotherapy    She received dose adjusted chemotherapy due to abnormal liver function tests and severe anemia. Treatment was delayed due to noncompliance  and subsequently stopped because the patient failed to keep appointments    01/11/2015 Imaging    Repeat PET CT scan showed response to treatment    01/28/2015 - 06/18/2015 Chemotherapy    ABVD was restarted with full dose.    02/08/2015 - 02/10/2015 Hospital Admission    The patient was admitted to the hospital due to pancytopenia and profuse diarrhea. Cultures were negative. She was placed on ciprofloxacin.    02/11/2015 Adverse Reaction    Treatment was placed on hold due to recent infection.    04/11/2015 Imaging    PET CT scan showed near complete response. Incidental finding of an abnormal bone lesion, indeterminate. She is not symptomatic. Recommendation from [facility] to observe    07/11/2015 Imaging    PET CT scan showed abnormal new bone lesions, suggestive of possible disease progression    07/23/2015 Bone Marrow Biopsy    She underwent bone biopsy    07/23/2015 Pathology Results    Accession: [id number]  biopsy was negative for cancer    11/21/2015 Surgery    She had surgery for ectopic pregnancy    01/29/2016 Imaging    Ct chest, abdomen and pelvis showed pelvic and retroperitoneal lymphadenopathy, as above, concerning for residual disease. There is also a mildly enlarged posterior mediastinal lymph node measuring 11 mm adjacent to the distal descending thoracic aorta. This may represent an additional focus of disease, but is the only finding of concern in the thorax on today's examination. Sclerosis in the right ilium at site of previously noted metabolically active lesion, grossly unchanged. No other definite osseous lesions are identified on today's examination. Spleen is normal in size and appearance.    02/14/2016 PET scan    Interval disease worsening with new foci of  hypermetabolic activity in multiple retroperitoneal and pelvic lymph nodes as well as AP window and left hilar lymph nodes. (Deauville 5). There is also overall worsening of the osseous disease.    05/11/2016 Pathology Results    Diagnosis Lymph node, needle/core biopsy, Left para-aortic retroperitoneal - CLASSICAL HODGKIN LYMPHOMA. - SEE ONCOLOGY TABLE. Microscopic Comment LYMPHOMA Histologic type: Classical Hodgkin lymphoma. Grade (if applicable): N/A Flow cytometry: Not done. Immunohistochemical stains: CD15, CD20, CD3, LCA, PAX-5, CD30 with appropriate controls. Touch preps/imprints: Not performed. Comments: The sections show small needle core biopsy fragments displaying a polymorphous cellular proliferation of small lymphocytes, plasma cells, eosinophils, and large atypical mononuclear and multilobated lymphoid cells with features of Reed-Sternberg cells and variants. This is associated with patchy areas of fibrosis. Immunohistochemical stains were performed and show that the large atypical lymphoid cells are positive for CD30, CD15 and PAX-5 and negative for LCA, CD20, CD3. The small lymphoid cells in the background show a mixture of T and B cells with predominance of  T cells. The overall morphologic and histologic features are consistent with classical Hodgkin lymphoma. Further subtyping is challenging in limited small biopsy fragments but the patchy fibrosis suggests nodular sclerosis type.     05/11/2016 Procedure    She underwent CT guided biopsy of retroperitoneal lymph node    05/26/2016 Procedure    Successful placement of a right IJ approach Power Port with ultrasound and fluoroscopic guidance. The catheter is ready for use.    06/02/2016 PET scan    Mixed response to chemotherapy with some lymph nodes decreased in metabolic activity and some lymph nodes increase metabolic activity. Lymph node stations including mediastinum, periaortic retroperitoneum, and obturator node stations.  Activity is remains relatively intense Deauville 4 2. LEFT infrahilar nodule / lymph node with intense metabolic activity decreased from prior. ( Deauville 4 ). 3. 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 Deauville 5    06/03/2016 - 06/05/2016 Hospital Admission    She was admitted to the hospital for cycle 1 of ICE chemotherapy    06/24/2016 - 06/26/2016 Hospital Admission    She received cycle 2 of ICE chemo    07/07/2016 PET scan    Resolution of prior hypermetabolic adenopathy and resolution of prior osseous foci of hypermetabolic activity compatible with essentially complete response to therapy (Deauville 1). 2. Generalized reduced activity in the L4 vertebral body and the type of finding which would typically reflect prior radiation therapy. 3. Stable septated fatty right pelvic lesion, possibly a dermoid or lipoma, not hypermetabolic    07/24/760 PET scan    Hypermetabolic lesion along the L3 vertebral body and left posterior elements, max SUV 7.8 (Deauville 5). Hypermetabolic lesion along the left inferior pubic ramus, max SUV 4.9 (Deauville 4).  IMPRESSION: Prevascular lymphadenopathy, reflecting nodal recurrence (Deauville 4). Hypermetabolic osseous metastases involving the L3 vertebral body/posterior elements and left inferior pubic ramus (Deauville 4-5). Hypermetabolism along the endometrium, new, possibly reactive/physiologic. Consider pelvic ultrasound and/or endometrial sampling as clinically warranted.    11/04/2016 - 02/24/2017 Chemotherapy    She received Brentuximab    12/11/2016 PET scan    1. Mixed response to therapy within the skeleton. Lesions at L3 is decreased in size and metabolic activity. Residual activity remains above liver ( Deauville 4) 2. Increased activity in the LEFT sacrum with metabolic activity above liver activity ( Deauville 4 3. Decrease in size and metabolic activity of anterior mediastinal tissue consistent with resolution of  thymic hyperplasia or resolution of lymphoma metabolic activity ( Deauville 2). 4. No new lymphadenopathy. Normal spleen and liver.    03/25/2017 PET scan    1. Increased metabolic activity in several normal sized retroperitoneal and right pelvic lymph nodes, Deauville 4. 2. Increase in size and metabolic activity of lesions in the L3 and L4 vertebral bodies and in the left sacrum, Deauville 5. 3. No recurrence of anterior mediastinal abnormal activity. 4. Other imaging findings of potential clinical significance: Chronic ethmoid and left maxillary sinusitis. Paraseptal emphysema at the lung apices. Right ovarian dermoid.    04/06/2017 -  Chemotherapy    The patient had Keytruda    06/09/2017 PET scan    1. Overall significant improvement with reduction in nodal activity. Several retroperitoneal nodes which were previously Deauville 4 are currently Deauville 3. The right common iliac lymph node remains at Deauville 4 with maximum SUV of 3.4, but is improved from prior SUV of 4.3. 2. The bony lesions show the greatest improvement, with the previously Deauville 5 hypermetabolic  lesions currently no longer of higher metabolic activity than surrounding bone marrow, currently measuring at Deauville 3. 3. 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 4. No new lesions are identified. 5. Emphysema (ICD10-J43.9).    08/05/2017 Imaging    1. Somewhat complex left adnexal lesion, potentially hemorrhagic cyst measuring 2.9 x 2.8 cm. The possibility of torsion in the left ovary must be considered. Advise correlation with pelvic ultrasound including Doppler assessment to further evaluate.  2.  Right ovarian dermoid, unchanged from recent PET-CT examination.  3. No adenopathy by size criteria evident. Appearance of subcentimeter mesenteric lymph nodes is stable compared to recent study.  4. No bone lesions appreciable by CT. Areas of abnormal radiotracer uptake in  the mid lumbar spine noted on recent PET-CT examination. No destruction 2 or lytic change noted in the lumbar vertebrae currently there.  5. No bowel obstruction. No abscess. No periappendiceal region inflammation.  6.  Small hiatal hernia with fluid in distal esophagus.  7.  Foci of coronary artery calcification, advanced for age.  8.  Spleen normal in size and contour.  9. Small hiatal hernia with mild fluid in the distal esophagus, likely indicative of a degree of reflux.     REVIEW OF SYSTEMS:   Constitutional: Denies fevers, chills  Eyes: Denies blurriness of vision Ears, nose, mouth, throat, and face: Denies mucositis or sore throat Respiratory: Denies cough, dyspnea or wheezes Cardiovascular: Denies palpitation, chest discomfort or lower extremity swelling Gastrointestinal:  Denies nausea, heartburn or change in bowel habits Skin: Denies abnormal skin rashes Lymphatics: Denies new lymphadenopathy or easy bruising Neurological:Denies numbness, tingling or new weaknesses Behavioral/Psych: Mood is stable, no new changes  All other systems were reviewed with the patient and are negative.  I have reviewed the past medical history, past surgical history, social history and family history with the patient and they are unchanged from previous note.  ALLERGIES:  is allergic to temazepam.  MEDICATIONS:  Current Outpatient Medications  Medication Sig Dispense Refill  . atazanavir (REYATAZ) 300 MG capsule Take 1 capsule (300 mg total) by mouth daily with breakfast. Take with Norvir. 30 capsule 5  . cholecalciferol (VITAMIN D) 1000 units tablet Take 1 tablet (1,000 Units total) by mouth daily. 30 tablet 9  . emtricitabine-tenofovir (TRUVADA) 200-300 MG tablet Take 1 tablet by mouth daily. 30 tablet 5  . fluconazole (DIFLUCAN) 150 MG tablet Take 1 tablet (150 mg total) by mouth every 3 (three) days as needed for up to 3 doses. 3 tablet 0  . HYDROcodone-acetaminophen  (NORCO/VICODIN) 5-325 MG tablet TK 1 T PO Q 4 TO 6 H PRF PAIN  0  . ibuprofen (ADVIL,MOTRIN) 600 MG tablet Take 1 tablet (600 mg total) by mouth every 6 (six) hours as needed. (Patient not taking: Reported on 11/30/2017) 30 tablet 0  . lidocaine-prilocaine (EMLA) cream Apply 1 application topically as needed. 30 g 11  . ondansetron (ZOFRAN ODT) 8 MG disintegrating tablet Take 1 tablet (8 mg total) by mouth every 8 (eight) hours as needed for nausea. 20 tablet 0  . Polyvinyl Alcohol-Povidone (CLEAR EYES ALL SEASONS OP) Place 1 drop into both eyes daily.    . ritonavir (NORVIR) 100 MG TABS tablet Take 1 tablet by mouth every morning with breakfast. Take with Reyataz. 30 tablet 5  . traMADol (ULTRAM) 50 MG tablet Take 2 tablets (100 mg total) by mouth every 6 (six) hours as needed for moderate pain. 90 tablet 0  . triamcinolone cream (  KENALOG) 0.1 % Apply 1 application topically 2 (two) times daily. (Patient taking differently: Apply 1 application topically 2 (two) times daily as needed (skin irritation). ) 30 g 0   No current facility-administered medications for this visit.     PHYSICAL EXAMINATION: ECOG PERFORMANCE STATUS: 1 - Symptomatic but completely ambulatory  Vitals:   12/30/17 0824  BP: 133/77  Pulse: 71  Resp: 18  Temp: 98.5 F (36.9 C)   Filed Weights   12/30/17 0824  Weight: 115 lb 9.6 oz (52.4 kg)    GENERAL:alert, no distress and comfortable SKIN: skin color, texture, turgor are normal, no rashes or significant lesions EYES: normal, Conjunctiva are pink and non-injected, sclera clear OROPHARYNX:no exudate, no erythema and lips, buccal mucosa, and tongue normal  NECK: supple, thyroid normal size, non-tender, without nodularity LYMPH:  no palpable lymphadenopathy in the cervical, axillary or inguinal LUNGS: clear to auscultation and percussion with normal breathing effort HEART: regular rate & rhythm and no murmurs and no lower extremity edema ABDOMEN:abdomen soft, mild  tenderness without rebound or guarding Musculoskeletal:no cyanosis of digits and no clubbing  NEURO: alert & oriented x 3 with fluent speech, no focal motor/sensory deficits  LABORATORY DATA:  I have reviewed the data as listed    Component Value Date/Time   NA 140 12/29/2017 0804   NA 140 04/29/2017 0745   K 3.6 12/29/2017 0804   K 3.3 (L) 04/29/2017 0745   CL 106 12/29/2017 0804   CO2 24 12/29/2017 0804   CO2 23 04/29/2017 0745   GLUCOSE 97 12/29/2017 0804   GLUCOSE 83 04/29/2017 0745   BUN 10 12/29/2017 0804   BUN 10.7 04/29/2017 0745   CREATININE 0.98 12/29/2017 0804   CREATININE 1.2 (H) 04/29/2017 0745   CALCIUM 10.2 12/29/2017 0804   CALCIUM 9.8 04/29/2017 0745   PROT 7.4 12/29/2017 0804   PROT 7.6 04/29/2017 0745   ALBUMIN 4.3 12/29/2017 0804   ALBUMIN 4.0 04/29/2017 0745   AST 15 12/29/2017 0804   AST 19 04/29/2017 0745   ALT 17 12/29/2017 0804   ALT 19 04/29/2017 0745   ALKPHOS 76 12/29/2017 0804   ALKPHOS 81 04/29/2017 0745   BILITOT 1.5 (H) 12/29/2017 0804   BILITOT 1.31 (H) 04/29/2017 0745   GFRNONAA >60 12/29/2017 0804   GFRNONAA 83 02/20/2016 0908   GFRAA >60 12/29/2017 0804   GFRAA >89 02/20/2016 0908    No results found for: SPEP, UPEP  Lab Results  Component Value Date   WBC 8.0 12/29/2017   NEUTROABS 5.8 12/29/2017   HGB 14.3 12/29/2017   HCT 41.1 12/29/2017   MCV 103.7 (H) 12/29/2017   PLT 250 12/29/2017      Chemistry      Component Value Date/Time   NA 140 12/29/2017 0804   NA 140 04/29/2017 0745   K 3.6 12/29/2017 0804   K 3.3 (L) 04/29/2017 0745   CL 106 12/29/2017 0804   CO2 24 12/29/2017 0804   CO2 23 04/29/2017 0745   BUN 10 12/29/2017 0804   BUN 10.7 04/29/2017 0745   CREATININE 0.98 12/29/2017 0804   CREATININE 1.2 (H) 04/29/2017 0745      Component Value Date/Time   CALCIUM 10.2 12/29/2017 0804   CALCIUM 9.8 04/29/2017 0745   ALKPHOS 76 12/29/2017 0804   ALKPHOS 81 04/29/2017 0745   AST 15 12/29/2017 0804   AST 19  04/29/2017 0745   ALT 17 12/29/2017 0804   ALT 19 04/29/2017 0745   BILITOT  1.5 (H) 12/29/2017 0804   BILITOT 1.31 (H) 04/29/2017 0745      All questions were answered. The patient knows to call the clinic with any problems, questions or concerns. No barriers to learning was detected.  I spent 25 minutes counseling the patient face to face. The total time spent in the appointment was 30 minutes and more than 50% was on counseling and review of test results  Heath Lark, MD 12/31/2017 7:16 AM

## 2017-12-31 NOTE — Assessment & Plan Note (Signed)
The patient is symptomatic with significant epigastric lower abdominal pain, which was consistent with prior cancer associated pain Unfortunately, her insurance company will not approve PET CT scan I recommend we proceed with treatment this week and then order CT scan of the chest, abdomen and pelvis to restage her disease and she agreed to proceed

## 2018-01-04 ENCOUNTER — Encounter (HOSPITAL_COMMUNITY): Payer: Self-pay

## 2018-01-04 ENCOUNTER — Ambulatory Visit (HOSPITAL_COMMUNITY)
Admission: RE | Admit: 2018-01-04 | Discharge: 2018-01-04 | Disposition: A | Discharge status: Home / Self Care | Payer: 59 | Source: Ambulatory Visit | Attending: Hematology and Oncology | Admitting: Hematology and Oncology

## 2018-01-04 DIAGNOSIS — C8198 Hodgkin lymphoma, unspecified, lymph nodes of multiple sites: Secondary | ICD-10-CM | POA: Insufficient documentation

## 2018-01-04 MED ORDER — IOHEXOL 300 MG/ML  SOLN
100.0000 mL | Freq: Once | INTRAMUSCULAR | Status: AC | PRN
  Administered 2018-01-04: 80 mL via INTRAVENOUS

## 2018-01-05 ENCOUNTER — Other Ambulatory Visit: Payer: Self-pay | Admitting: Hematology and Oncology

## 2018-01-05 ENCOUNTER — Inpatient Hospital Stay (HOSPITAL_BASED_OUTPATIENT_CLINIC_OR_DEPARTMENT_OTHER): Payer: 59 | Admitting: Hematology and Oncology

## 2018-01-05 ENCOUNTER — Telehealth: Payer: Self-pay | Admitting: Hematology and Oncology

## 2018-01-05 ENCOUNTER — Encounter: Payer: Self-pay | Admitting: Hematology and Oncology

## 2018-01-05 DIAGNOSIS — D270 Benign neoplasm of right ovary: Secondary | ICD-10-CM | POA: Insufficient documentation

## 2018-01-05 DIAGNOSIS — M1990 Unspecified osteoarthritis, unspecified site: Secondary | ICD-10-CM | POA: Diagnosis not present

## 2018-01-05 DIAGNOSIS — C8118 Nodular sclerosis classical Hodgkin lymphoma, lymph nodes of multiple sites: Secondary | ICD-10-CM | POA: Diagnosis not present

## 2018-01-05 DIAGNOSIS — Z5112 Encounter for antineoplastic immunotherapy: Secondary | ICD-10-CM | POA: Diagnosis not present

## 2018-01-05 MED ORDER — PREDNISONE 10 MG PO TABS: 10.0000 mg | ORAL_TABLET | Freq: Every day | ORAL | 0 refills | Status: DC

## 2018-01-05 NOTE — Assessment & Plan Note (Signed)
Imaging study shows stable dermoid cyst In the scope of things, she will not undergo any kind of gynecological surgery She will continue Lupron to suppress menstruation

## 2018-01-05 NOTE — Progress Notes (Signed)
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  Due to her prior history of recurrent disease, she is in agreement to go on maintenance therapy every 4 weeks.  Inflammatory arthritis She has recent diffuse inflammatory arthritis that responded well to prednisone therapy I will continue for 10 more days I will also check serum vitamin D level. The patient stated that she has been compliant taking her prescription as directed  Dermoid cyst of ovary, right Imaging study shows stable dermoid cyst In the scope of things, she will not undergo any kind of gynecological surgery She will continue Lupron to suppress menstruation   Orders Placed This Encounter  Procedures  . Sedimentation rate    Standing Status:   Future    Standing Expiration Date:   02/09/2019  . VITAMIN D 25 Hydroxy (Vit-D Deficiency, Fractures)    Standing Status:   Future    Standing Expiration Date:   01/06/2019    INTERVAL HISTORY: Please see below for problem oriented charting. She returns for further follow-up to review CT scan result Since the last time I saw her, she is taking prednisone and that has helped with her inflammatory arthritis significantly She is not taking as much  tramadol The prednisone is helping her to gain weight and energy.  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    05/07/2014 Imaging    CT scan of the chest show right thoracic inlet lymphadenopathy    06/07/2014 Procedure    She underwent ultrasound-guided core biopsy of the neck lymph node    06/07/2014 Pathology Results    Accession: [id number] biopsy confirmed diagnosis of Hodgkin lymphoma.    06/15/2014 Imaging    Echocardiogram showed preserved ejection fraction    07/09/2014 - 07/12/2014 Hospital Admission    She was admitted to the hospital for severe anemia.    07/27/2014 Procedure    She had placement of port    07/31/2014 - 09/11/2014 Chemotherapy    She received dose adjusted chemotherapy due to abnormal liver function tests and severe anemia. Treatment was delayed due to noncompliance  and subsequently stopped because the patient failed to keep appointments    01/11/2015 Imaging    Repeat PET CT scan showed response to treatment    01/28/2015 - 06/18/2015 Chemotherapy    ABVD was restarted with full dose.    02/08/2015 - 02/10/2015 Hospital Admission    The patient was admitted to the hospital due to pancytopenia and profuse diarrhea. Cultures were negative. She was placed on ciprofloxacin.    02/11/2015 Adverse Reaction    Treatment was placed on hold due to recent infection.    04/11/2015 Imaging    PET CT scan showed near complete response. Incidental finding  of an abnormal bone lesion, indeterminate. She is not symptomatic. Recommendation from [facility] to observe    07/11/2015 Imaging    PET CT scan showed abnormal new bone lesions, suggestive of possible disease progression    07/23/2015 Bone Marrow Biopsy    She underwent bone biopsy    07/23/2015 Pathology Results    Accession: [id number]  biopsy was negative for cancer    11/21/2015 Surgery    She  had surgery for ectopic pregnancy    01/29/2016 Imaging    Ct chest, abdomen and pelvis showed pelvic and retroperitoneal lymphadenopathy, as above, concerning for residual disease. There is also a mildly enlarged posterior mediastinal lymph node measuring 11 mm adjacent to the distal descending thoracic aorta. This may represent an additional focus of disease, but is the only finding of concern in the thorax on today's examination. Sclerosis in the right ilium at site of previously noted metabolically active lesion, grossly unchanged. No other definite osseous lesions are identified on today's examination. Spleen is normal in size and appearance.    02/14/2016 PET scan    Interval disease worsening with new foci of hypermetabolic activity in multiple retroperitoneal and pelvic lymph nodes as well as AP window and left hilar lymph nodes. (Deauville 5). There is also overall worsening of the osseous disease.    05/11/2016 Pathology Results    Diagnosis Lymph node, needle/core biopsy, Left para-aortic retroperitoneal - CLASSICAL HODGKIN LYMPHOMA. - SEE ONCOLOGY TABLE. Microscopic Comment LYMPHOMA Histologic type: Classical Hodgkin lymphoma. Grade (if applicable): N/A Flow cytometry: Not done. Immunohistochemical stains: CD15, CD20, CD3, LCA, PAX-5, CD30 with appropriate controls. Touch preps/imprints: Not performed. Comments: The sections show small needle core biopsy fragments displaying a polymorphous cellular proliferation of small lymphocytes, plasma cells, eosinophils, and large atypical mononuclear and multilobated lymphoid cells with features of Reed-Sternberg cells and variants. This is associated with patchy areas of fibrosis. Immunohistochemical stains were performed and show that the large atypical lymphoid cells are positive for CD30, CD15 and PAX-5 and negative for LCA, CD20, CD3. The small lymphoid cells in the background show a mixture of T and B cells with predominance of T cells.  The overall morphologic and histologic features are consistent with classical Hodgkin lymphoma. Further subtyping is challenging in limited small biopsy fragments but the patchy fibrosis suggests nodular sclerosis type.     05/11/2016 Procedure    She underwent CT guided biopsy of retroperitoneal lymph node    05/26/2016 Procedure    Successful placement of a right IJ approach Power Port with ultrasound and fluoroscopic guidance. The catheter is ready for use.    06/02/2016 PET scan    Mixed response to chemotherapy with some lymph nodes decreased in metabolic activity and some lymph nodes increase metabolic activity. Lymph node stations including mediastinum, periaortic retroperitoneum, and obturator node stations. Activity is remains relatively intense Deauville 4 2. LEFT infrahilar nodule / lymph node with intense metabolic activity decreased from prior. ( Deauville 4 ). 3. 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 Deauville 5    06/03/2016 - 06/05/2016 Hospital Admission    She was admitted to the hospital for cycle 1 of ICE chemotherapy    06/24/2016 - 06/26/2016 Hospital Admission    She received cycle 2 of ICE chemo    07/07/2016 PET scan    Resolution of prior hypermetabolic adenopathy and resolution of prior osseous foci of hypermetabolic activity compatible with essentially complete response to therapy (Deauville 1). 2. Generalized reduced activity in  the L4 vertebral body and the type of finding which would typically reflect prior radiation therapy. 3. Stable septated fatty right pelvic lesion, possibly a dermoid or lipoma, not hypermetabolic    2/63/7858 PET scan    Hypermetabolic lesion along the L3 vertebral body and left posterior elements, max SUV 7.8 (Deauville 5). Hypermetabolic lesion along the left inferior pubic ramus, max SUV 4.9 (Deauville 4).  IMPRESSION: Prevascular lymphadenopathy, reflecting nodal recurrence (Deauville 4). Hypermetabolic osseous  metastases involving the L3 vertebral body/posterior elements and left inferior pubic ramus (Deauville 4-5). Hypermetabolism along the endometrium, new, possibly reactive/physiologic. Consider pelvic ultrasound and/or endometrial sampling as clinically warranted.    11/04/2016 - 02/24/2017 Chemotherapy    She received Brentuximab    12/11/2016 PET scan    1. Mixed response to therapy within the skeleton. Lesions at L3 is decreased in size and metabolic activity. Residual activity remains above liver ( Deauville 4) 2. Increased activity in the LEFT sacrum with metabolic activity above liver activity ( Deauville 4 3. Decrease in size and metabolic activity of anterior mediastinal tissue consistent with resolution of thymic hyperplasia or resolution of lymphoma metabolic activity ( Deauville 2). 4. No new lymphadenopathy. Normal spleen and liver.    03/25/2017 PET scan    1. Increased metabolic activity in several normal sized retroperitoneal and right pelvic lymph nodes, Deauville 4. 2. Increase in size and metabolic activity of lesions in the L3 and L4 vertebral bodies and in the left sacrum, Deauville 5. 3. No recurrence of anterior mediastinal abnormal activity. 4. Other imaging findings of potential clinical significance: Chronic ethmoid and left maxillary sinusitis. Paraseptal emphysema at the lung apices. Right ovarian dermoid.    04/06/2017 -  Chemotherapy    The patient had Keytruda    06/09/2017 PET scan    1. Overall significant improvement with reduction in nodal activity. Several retroperitoneal nodes which were previously Deauville 4 are currently Deauville 3. The right common iliac lymph node remains at Deauville 4 with maximum SUV of 3.4, but is improved from prior SUV of 4.3. 2. The bony lesions show the greatest improvement, with the previously Deauville 5 hypermetabolic lesions currently no longer of higher metabolic activity than surrounding bone marrow, currently measuring at  Deauville 3. 3. 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 4. No new lesions are identified. 5. Emphysema (ICD10-J43.9).    08/05/2017 Imaging    1. Somewhat complex left adnexal lesion, potentially hemorrhagic cyst measuring 2.9 x 2.8 cm. The possibility of torsion in the left ovary must be considered. Advise correlation with pelvic ultrasound including Doppler assessment to further evaluate.  2.  Right ovarian dermoid, unchanged from recent PET-CT examination.  3. No adenopathy by size criteria evident. Appearance of subcentimeter mesenteric lymph nodes is stable compared to recent study.  4. No bone lesions appreciable by CT. Areas of abnormal radiotracer uptake in the mid lumbar spine noted on recent PET-CT examination. No destruction 2 or lytic change noted in the lumbar vertebrae currently there.  5. No bowel obstruction. No abscess. No periappendiceal region inflammation.  6.  Small hiatal hernia with fluid in distal esophagus.  7.  Foci of coronary artery calcification, advanced for age.  8.  Spleen normal in size and contour.  9. Small hiatal hernia with mild fluid in the distal esophagus, likely indicative of a degree of reflux.    01/04/2018 Imaging    1. No findings to suggest residual/recurrent lymphoma in the chest, abdomen or pelvis. 2.  Right ovarian dermoid slightly larger than prior examinations, currently measuring 3.8 x 2.8 x 3.1 cm. 3. Additional incidental findings, as above.     REVIEW OF SYSTEMS:   Constitutional: Denies fevers, chills or abnormal weight loss Eyes: Denies blurriness of vision Ears, nose, mouth, throat, and face: Denies mucositis or sore throat Respiratory: Denies cough, dyspnea or wheezes Cardiovascular: Denies palpitation, chest discomfort or lower extremity swelling Gastrointestinal:  Denies nausea, heartburn or change in bowel habits Skin: Denies abnormal skin rashes Lymphatics: Denies  new lymphadenopathy or easy bruising Neurological:Denies numbness, tingling or new weaknesses Behavioral/Psych: Mood is stable, no new changes  All other systems were reviewed with the patient and are negative.  I have reviewed the past medical history, past surgical history, social history and family history with the patient and they are unchanged from previous note.  ALLERGIES:  is allergic to temazepam.  MEDICATIONS:  Current Outpatient Medications  Medication Sig Dispense Refill  . atazanavir (REYATAZ) 300 MG capsule Take 1 capsule (300 mg total) by mouth daily with breakfast. Take with Norvir. 30 capsule 5  . cholecalciferol (VITAMIN D) 1000 units tablet Take 1 tablet (1,000 Units total) by mouth daily. 30 tablet 9  . emtricitabine-tenofovir (TRUVADA) 200-300 MG tablet Take 1 tablet by mouth daily. 30 tablet 5  . lidocaine-prilocaine (EMLA) cream Apply 1 application topically as needed. 30 g 11  . ondansetron (ZOFRAN ODT) 8 MG disintegrating tablet Take 1 tablet (8 mg total) by mouth every 8 (eight) hours as needed for nausea. 20 tablet 0  . Polyvinyl Alcohol-Povidone (CLEAR EYES ALL SEASONS OP) Place 1 drop into both eyes daily.    . predniSONE (DELTASONE) 10 MG tablet Take 1 tablet (10 mg total) by mouth daily with breakfast. 10 tablet 0  . ritonavir (NORVIR) 100 MG TABS tablet Take 1 tablet by mouth every morning with breakfast. Take with Reyataz. 30 tablet 5  . traMADol (ULTRAM) 50 MG tablet Take 2 tablets (100 mg total) by mouth every 6 (six) hours as needed for moderate pain. 90 tablet 0   No current facility-administered medications for this visit.     PHYSICAL EXAMINATION: ECOG PERFORMANCE STATUS: 1 - Symptomatic but completely ambulatory  Vitals:   01/05/18 0800  BP: 121/79  Pulse: 68  Resp: 18  Temp: 98.1 F (36.7 C)  SpO2: 100%   Filed Weights   01/05/18 0800  Weight: 117 lb 8 oz (53.3 kg)    GENERAL:alert, no distress and comfortable Musculoskeletal:no  cyanosis of digits and no clubbing  NEURO: alert & oriented x 3 with fluent speech, no focal motor/sensory deficits  LABORATORY DATA:  I have reviewed the data as listed    Component Value Date/Time   NA 140 12/29/2017 0804   NA 140 04/29/2017 0745   K 3.6 12/29/2017 0804   K 3.3 (L) 04/29/2017 0745   CL 106 12/29/2017 0804   CO2 24 12/29/2017 0804   CO2 23 04/29/2017 0745   GLUCOSE 97 12/29/2017 0804   GLUCOSE 83 04/29/2017 0745   BUN 10 12/29/2017 0804   BUN 10.7 04/29/2017 0745   CREATININE 0.98 12/29/2017 0804   CREATININE 1.2 (H) 04/29/2017 0745   CALCIUM 10.2 12/29/2017 0804   CALCIUM 9.8 04/29/2017 0745   PROT 7.4 12/29/2017 0804   PROT 7.6 04/29/2017 0745   ALBUMIN 4.3 12/29/2017 0804   ALBUMIN 4.0 04/29/2017 0745   AST 15 12/29/2017 0804   AST 19 04/29/2017 0745   ALT 17 12/29/2017 0804  ALT 19 04/29/2017 0745   ALKPHOS 76 12/29/2017 0804   ALKPHOS 81 04/29/2017 0745   BILITOT 1.5 (H) 12/29/2017 0804   BILITOT 1.31 (H) 04/29/2017 0745   GFRNONAA >60 12/29/2017 0804   GFRNONAA 83 02/20/2016 0908   GFRAA >60 12/29/2017 0804   GFRAA >89 02/20/2016 0908    No results found for: SPEP, UPEP  Lab Results  Component Value Date   WBC 8.0 12/29/2017   NEUTROABS 5.8 12/29/2017   HGB 14.3 12/29/2017   HCT 41.1 12/29/2017   MCV 103.7 (H) 12/29/2017   PLT 250 12/29/2017      Chemistry      Component Value Date/Time   NA 140 12/29/2017 0804   NA 140 04/29/2017 0745   K 3.6 12/29/2017 0804   K 3.3 (L) 04/29/2017 0745   CL 106 12/29/2017 0804   CO2 24 12/29/2017 0804   CO2 23 04/29/2017 0745   BUN 10 12/29/2017 0804   BUN 10.7 04/29/2017 0745   CREATININE 0.98 12/29/2017 0804   CREATININE 1.2 (H) 04/29/2017 0745      Component Value Date/Time   CALCIUM 10.2 12/29/2017 0804   CALCIUM 9.8 04/29/2017 0745   ALKPHOS 76 12/29/2017 0804   ALKPHOS 81 04/29/2017 0745   AST 15 12/29/2017 0804   AST 19 04/29/2017 0745   ALT 17 12/29/2017 0804   ALT 19  04/29/2017 0745   BILITOT 1.5 (H) 12/29/2017 0804   BILITOT 1.31 (H) 04/29/2017 0745       RADIOGRAPHIC STUDIES: I have personally reviewed the radiological images as listed and agreed with the findings in the report. Ct Chest W Contrast  Result Date: 01/04/2018 CLINICAL DATA:  33 year old female with history of Hodgkin's lymphoma diagnosed in 2016. Ongoing chemotherapy. Follow-up study. EXAM: CT CHEST, ABDOMEN, AND PELVIS WITH CONTRAST TECHNIQUE: Multidetector CT imaging of the chest, abdomen and pelvis was performed following the standard protocol during bolus administration of intravenous contrast. CONTRAST:  21m OMNIPAQUE IOHEXOL 300 MG/ML  SOLN COMPARISON:  CT the abdomen and pelvis 08/05/2017. PET-CT 06/09/2017. Chest CT 05/07/2014. FINDINGS: CT CHEST FINDINGS Cardiovascular: Heart size is normal. There is no significant pericardial fluid, thickening or pericardial calcification. No atherosclerotic calcifications noted in the thoracic aorta or the coronary arteries. Right internal jugular single-lumen porta cath with tip terminating at the superior cavoatrial junction. Mediastinum/Nodes: No pathologically enlarged mediastinal or hilar lymph nodes. Esophagus is unremarkable in appearance. No axillary lymphadenopathy. Lungs/Pleura: 3 mm subpleural nodule in the periphery of the right middle lobe (axial image 69 of series 6), unchanged compared to prior examination dating back to 05/07/2014, considered definitively benign, presumably a subpleural lymph node. No other larger more suspicious appearing pulmonary nodules or masses are noted. No acute consolidative airspace disease. No pleural effusions. Musculoskeletal: There are no aggressive appearing lytic or blastic lesions noted in the visualized portions of the skeleton. CT ABDOMEN PELVIS FINDINGS Hepatobiliary: No suspicious cystic or solid hepatic lesions. No intra or extrahepatic biliary ductal dilatation. Gallbladder is normal in appearance.  Pancreas: No pancreatic mass. No pancreatic ductal dilatation. No pancreatic or peripancreatic fluid or inflammatory changes. Spleen: Normal in size and unremarkable in appearance. Adrenals/Urinary Tract: Bilateral kidneys and bilateral adrenal glands are normal in appearance. No hydroureteronephrosis. Urinary bladder is normal in appearance. Stomach/Bowel: Normal appearance of the stomach. No pathologic dilatation of small bowel or colon. Normal appendix. Vascular/Lymphatic: No significant atherosclerotic disease, aneurysm or dissection noted in the abdominal or pelvic vasculature. No lymphadenopathy noted in the abdomen or pelvis.  Reproductive: Uterus and left ovary are unremarkable in appearance. In the right adnexa there is a 3.8 x 2.8 x 3.1 cm fatty attenuation lesion with some internal soft tissue attenuation, presumably an ovarian dermoid. This is only slightly larger than the prior examination from 08/05/2017. Other: No significant volume of ascites.  No pneumoperitoneum. Musculoskeletal: There are no aggressive appearing lytic or blastic lesions noted in the visualized portions of the skeleton. IMPRESSION: 1. No findings to suggest residual/recurrent lymphoma in the chest, abdomen or pelvis. 2. Right ovarian dermoid slightly larger than prior examinations, currently measuring 3.8 x 2.8 x 3.1 cm. 3. Additional incidental findings, as above. Electronically Signed   By: Vinnie Langton M.D.   On: 01/04/2018 11:03   Ct Abdomen Pelvis W Contrast  Result Date: 01/04/2018 CLINICAL DATA:  32 year old female with history of Hodgkin's lymphoma diagnosed in 2016. Ongoing chemotherapy. Follow-up study. EXAM: CT CHEST, ABDOMEN, AND PELVIS WITH CONTRAST TECHNIQUE: Multidetector CT imaging of the chest, abdomen and pelvis was performed following the standard protocol during bolus administration of intravenous contrast. CONTRAST:  72m OMNIPAQUE IOHEXOL 300 MG/ML  SOLN COMPARISON:  CT the abdomen and pelvis  08/05/2017. PET-CT 06/09/2017. Chest CT 05/07/2014. FINDINGS: CT CHEST FINDINGS Cardiovascular: Heart size is normal. There is no significant pericardial fluid, thickening or pericardial calcification. No atherosclerotic calcifications noted in the thoracic aorta or the coronary arteries. Right internal jugular single-lumen porta cath with tip terminating at the superior cavoatrial junction. Mediastinum/Nodes: No pathologically enlarged mediastinal or hilar lymph nodes. Esophagus is unremarkable in appearance. No axillary lymphadenopathy. Lungs/Pleura: 3 mm subpleural nodule in the periphery of the right middle lobe (axial image 69 of series 6), unchanged compared to prior examination dating back to 05/07/2014, considered definitively benign, presumably a subpleural lymph node. No other larger more suspicious appearing pulmonary nodules or masses are noted. No acute consolidative airspace disease. No pleural effusions. Musculoskeletal: There are no aggressive appearing lytic or blastic lesions noted in the visualized portions of the skeleton. CT ABDOMEN PELVIS FINDINGS Hepatobiliary: No suspicious cystic or solid hepatic lesions. No intra or extrahepatic biliary ductal dilatation. Gallbladder is normal in appearance. Pancreas: No pancreatic mass. No pancreatic ductal dilatation. No pancreatic or peripancreatic fluid or inflammatory changes. Spleen: Normal in size and unremarkable in appearance. Adrenals/Urinary Tract: Bilateral kidneys and bilateral adrenal glands are normal in appearance. No hydroureteronephrosis. Urinary bladder is normal in appearance. Stomach/Bowel: Normal appearance of the stomach. No pathologic dilatation of small bowel or colon. Normal appendix. Vascular/Lymphatic: No significant atherosclerotic disease, aneurysm or dissection noted in the abdominal or pelvic vasculature. No lymphadenopathy noted in the abdomen or pelvis. Reproductive: Uterus and left ovary are unremarkable in appearance. In  the right adnexa there is a 3.8 x 2.8 x 3.1 cm fatty attenuation lesion with some internal soft tissue attenuation, presumably an ovarian dermoid. This is only slightly larger than the prior examination from 08/05/2017. Other: No significant volume of ascites.  No pneumoperitoneum. Musculoskeletal: There are no aggressive appearing lytic or blastic lesions noted in the visualized portions of the skeleton. IMPRESSION: 1. No findings to suggest residual/recurrent lymphoma in the chest, abdomen or pelvis. 2. Right ovarian dermoid slightly larger than prior examinations, currently measuring 3.8 x 2.8 x 3.1 cm. 3. Additional incidental findings, as above. Electronically Signed   By: DVinnie LangtonM.D.   On: 01/04/2018 11:03    All questions were answered. The patient knows to call the clinic with any problems, questions or concerns. No barriers to learning was detected.  I spent 15 minutes counseling the patient face to face. The total time spent in the appointment was 20 minutes and more than 50% was on counseling and review of test results  Heath Lark, MD 01/05/2018 9:21 AM

## 2018-01-05 NOTE — Assessment & Plan Note (Addendum)
I have reviewed of CT imaging with the patient She has complete response to therapy Her symptoms of inflammatory arthritis is due to checkpoint inhibitor side effects She has responded well to low-dose prednisone therapy I will continue for another 10 days We discussed the risk and benefits of continuing treatment.  Due to her prior history of recurrent disease, she is in agreement to go on maintenance therapy every 4 weeks.

## 2018-01-05 NOTE — Telephone Encounter (Signed)
Gave avs and calendar ° °

## 2018-01-05 NOTE — Assessment & Plan Note (Signed)
She has recent diffuse inflammatory arthritis that responded well to prednisone therapy I will continue for 10 more days I will also check serum vitamin D level. The patient stated that she has been compliant taking her prescription as directed

## 2018-01-10 ENCOUNTER — Telehealth: Payer: Self-pay | Admitting: *Deleted

## 2018-01-10 ENCOUNTER — Other Ambulatory Visit: Payer: Self-pay | Admitting: Hematology and Oncology

## 2018-01-10 DIAGNOSIS — B20 Human immunodeficiency virus [HIV] disease: Secondary | ICD-10-CM

## 2018-01-10 MED ORDER — TRAMADOL HCL 50 MG PO TABS: 100.0000 mg | ORAL_TABLET | Freq: Four times a day (QID) | ORAL | 0 refills | Status: DC | PRN

## 2018-01-10 NOTE — Telephone Encounter (Signed)
I have refilled electronically to local pharmacy

## 2018-01-10 NOTE — Telephone Encounter (Signed)
Called and left below message. 

## 2018-01-10 NOTE — Telephone Encounter (Signed)
8:12 am  Received vm message from patient requesting refill of Tramadol. Last filled on 12/29/17 for 90 tablets. Please return her call @ [phone]

## 2018-01-11 ENCOUNTER — Ambulatory Visit (INDEPENDENT_AMBULATORY_CARE_PROVIDER_SITE_OTHER): Payer: 59 | Admitting: Internal Medicine

## 2018-01-11 ENCOUNTER — Encounter: Payer: Self-pay | Admitting: Internal Medicine

## 2018-01-11 VITALS — BP 130/88 | HR 74 | Temp 98.1°F | Wt 115.1 lb

## 2018-01-11 DIAGNOSIS — Z7185 Encounter for immunization safety counseling: Secondary | ICD-10-CM

## 2018-01-11 DIAGNOSIS — Z7189 Other specified counseling: Secondary | ICD-10-CM

## 2018-01-11 DIAGNOSIS — R21 Rash and other nonspecific skin eruption: Secondary | ICD-10-CM | POA: Diagnosis not present

## 2018-01-11 DIAGNOSIS — B20 Human immunodeficiency virus [HIV] disease: Secondary | ICD-10-CM | POA: Diagnosis not present

## 2018-01-11 MED ORDER — VALACYCLOVIR HCL 1 G PO TABS: 1000.0000 mg | ORAL_TABLET | Freq: Two times a day (BID) | ORAL | 0 refills | Status: DC

## 2018-01-11 NOTE — Assessment & Plan Note (Addendum)
Doing well, no changes and rtc 4 months.  Will schedule her for PAP appt

## 2018-01-11 NOTE — Progress Notes (Signed)
   Subjective:    Patient ID: [name], female    DOB: 28-Feb-1986, 32 y.o.   MRN: [id number]  HPI Here for follow up of HIV Continues on Reyataz, norvir, Truvada and no missed doses.  She has been hesitant to change and with other mediations I have kept her on this regimen. No labs prior to today's visit.  Continues to see oncology, Dr. Alvy Bimler, for her  Hodgkin lymphoma and getting chemo once a month.  Recent CT with good response to therapy.  Unfortunately lost weight again due to poor appetite.  Complaint of a rash on her face, arm and left buttock.  Has been months.     Review of Systems  Constitutional: Negative for fatigue and fever.  Neurological: Negative for dizziness.       Objective:   Physical Exam  Constitutional: She appears well-developed and well-nourished. No distress.  HENT:  Mouth/Throat: No oropharyngeal exudate.  Eyes: No scleral icterus.  Cardiovascular: Normal rate, regular rhythm and normal heart sounds.  No murmur heard. Pulmonary/Chest: Effort normal and breath sounds normal. No respiratory distress.  Lymphadenopathy:    She has no cervical adenopathy.  Skin: Rash noted.  Facial ulcer at site where she picked off rash, nodular rash on right arm and left buttock.  Itchy.    SH: still occasional tobacco       Assessment & Plan:

## 2018-01-11 NOTE — Assessment & Plan Note (Signed)
I am not sure of etiology of rash.  Could be molluscum but just a few lesions.  Doubt zoster but will try valtrex.  She has an appt with dermatology in December.

## 2018-01-11 NOTE — Assessment & Plan Note (Signed)
She was counseled on need for flu vaccine, Prevnar.  Patient is refusing for now.

## 2018-01-12 LAB — T-HELPER CELL (CD4) - (RCID CLINIC ONLY)
CD4 % Helper T Cell: 37 % (ref 33–55)
CD4 T CELL ABS: 890 /uL (ref 400–2700)

## 2018-01-13 LAB — HIV-1 RNA QUANT-NO REFLEX-BLD
HIV 1 RNA Quant: 22 copies/mL — ABNORMAL HIGH
HIV-1 RNA Quant, Log: 1.34 Log copies/mL — ABNORMAL HIGH

## 2018-01-19 ENCOUNTER — Telehealth: Payer: Self-pay

## 2018-01-19 ENCOUNTER — Other Ambulatory Visit: Payer: Self-pay

## 2018-01-19 MED ORDER — PREDNISONE 20 MG PO TABS: 20.0000 mg | ORAL_TABLET | Freq: Every day | ORAL | 0 refills | Status: DC

## 2018-01-19 NOTE — Telephone Encounter (Signed)
She called and left a message to call her. She is requesting a refill on her Tramadol Rx. She is having a lot of pain in her right side of her back since she got her Lupron shot.  Called back per Dr. Alvy Bimler. It is too early for a refill on the Tramadol. Rx for Prednisone 20 mg daily x 7 days sent to pharmacy. She is alternating heat and ice to back pain. She verbalized understanding.

## 2018-01-21 ENCOUNTER — Ambulatory Visit: Payer: Self-pay | Admitting: Infectious Diseases

## 2018-01-21 ENCOUNTER — Other Ambulatory Visit: Payer: Self-pay | Admitting: Hematology and Oncology

## 2018-01-21 ENCOUNTER — Telehealth: Payer: Self-pay

## 2018-01-21 DIAGNOSIS — B20 Human immunodeficiency virus [HIV] disease: Secondary | ICD-10-CM

## 2018-01-21 MED ORDER — TRAMADOL HCL 50 MG PO TABS: 100.0000 mg | ORAL_TABLET | Freq: Four times a day (QID) | ORAL | 0 refills | Status: DC | PRN

## 2018-01-21 NOTE — Telephone Encounter (Signed)
She called and left a message requesting refill on Tramadol Rx. She is having transportation issues and her 48 # is no longer available. She is requesting office send Rx to [facility] on [address].

## 2018-01-21 NOTE — Telephone Encounter (Signed)
done

## 2018-01-21 NOTE — Telephone Encounter (Signed)
Called and told Rx sent to pharmacy. °

## 2018-01-26 ENCOUNTER — Telehealth: Payer: Self-pay

## 2018-01-26 NOTE — Telephone Encounter (Signed)
She called and left a message to clarify time of appt tomorrow.  Called back and given appt times for tomorrow. She verbalized understanding.

## 2018-01-27 ENCOUNTER — Inpatient Hospital Stay: Payer: 59

## 2018-01-27 ENCOUNTER — Inpatient Hospital Stay (HOSPITAL_BASED_OUTPATIENT_CLINIC_OR_DEPARTMENT_OTHER): Payer: 59 | Admitting: Hematology and Oncology

## 2018-01-27 ENCOUNTER — Encounter: Payer: Self-pay | Admitting: Hematology and Oncology

## 2018-01-27 ENCOUNTER — Inpatient Hospital Stay: Payer: 59 | Attending: Hematology and Oncology

## 2018-01-27 DIAGNOSIS — M542 Cervicalgia: Secondary | ICD-10-CM | POA: Insufficient documentation

## 2018-01-27 DIAGNOSIS — C8198 Hodgkin lymphoma, unspecified, lymph nodes of multiple sites: Secondary | ICD-10-CM

## 2018-01-27 DIAGNOSIS — R630 Anorexia: Secondary | ICD-10-CM | POA: Diagnosis not present

## 2018-01-27 DIAGNOSIS — Z79899 Other long term (current) drug therapy: Secondary | ICD-10-CM | POA: Insufficient documentation

## 2018-01-27 DIAGNOSIS — C8118 Nodular sclerosis classical Hodgkin lymphoma, lymph nodes of multiple sites: Secondary | ICD-10-CM

## 2018-01-27 DIAGNOSIS — M1990 Unspecified osteoarthritis, unspecified site: Secondary | ICD-10-CM

## 2018-01-27 DIAGNOSIS — Z5112 Encounter for antineoplastic immunotherapy: Secondary | ICD-10-CM | POA: Insufficient documentation

## 2018-01-27 DIAGNOSIS — M7989 Other specified soft tissue disorders: Secondary | ICD-10-CM | POA: Diagnosis not present

## 2018-01-27 DIAGNOSIS — Z95828 Presence of other vascular implants and grafts: Secondary | ICD-10-CM

## 2018-01-27 DIAGNOSIS — R221 Localized swelling, mass and lump, neck: Secondary | ICD-10-CM | POA: Diagnosis not present

## 2018-01-27 DIAGNOSIS — E876 Hypokalemia: Secondary | ICD-10-CM | POA: Insufficient documentation

## 2018-01-27 DIAGNOSIS — Z5111 Encounter for antineoplastic chemotherapy: Secondary | ICD-10-CM

## 2018-01-27 LAB — COMPREHENSIVE METABOLIC PANEL
ALT: 17 U/L (ref 0–44)
ANION GAP: 10 (ref 5–15)
AST: 13 U/L — ABNORMAL LOW (ref 15–41)
Albumin: 4.1 g/dL (ref 3.5–5.0)
Alkaline Phosphatase: 76 U/L (ref 38–126)
BUN: 11 mg/dL (ref 6–20)
CALCIUM: 9.4 mg/dL (ref 8.9–10.3)
CHLORIDE: 106 mmol/L (ref 98–111)
CO2: 25 mmol/L (ref 22–32)
Creatinine, Ser: 0.99 mg/dL (ref 0.44–1.00)
Glucose, Bld: 98 mg/dL (ref 70–99)
Potassium: 3 mmol/L — CL (ref 3.5–5.1)
SODIUM: 141 mmol/L (ref 135–145)
Total Bilirubin: 1 mg/dL (ref 0.3–1.2)
Total Protein: 6.8 g/dL (ref 6.5–8.1)

## 2018-01-27 LAB — CBC WITH DIFFERENTIAL/PLATELET
Basophils Absolute: 0.1 10*3/uL (ref 0.0–0.1)
Basophils Relative: 1 %
EOS ABS: 0 10*3/uL (ref 0.0–0.5)
EOS PCT: 0 %
HCT: 39.2 % (ref 34.8–46.6)
Hemoglobin: 13.3 g/dL (ref 11.6–15.9)
LYMPHS ABS: 2.8 10*3/uL (ref 0.9–3.3)
LYMPHS PCT: 24 %
MCH: 35.1 pg — ABNORMAL HIGH (ref 25.1–34.0)
MCHC: 33.9 g/dL (ref 31.5–36.0)
MCV: 103.6 fL — AB (ref 79.5–101.0)
MONO ABS: 0.6 10*3/uL (ref 0.1–0.9)
MONOS PCT: 6 %
Neutro Abs: 7.9 10*3/uL — ABNORMAL HIGH (ref 1.5–6.5)
Neutrophils Relative %: 69 %
PLATELETS: 247 10*3/uL (ref 145–400)
RBC: 3.79 MIL/uL (ref 3.70–5.45)
RDW: 13.2 % (ref 11.2–14.5)
WBC: 11.5 10*3/uL — ABNORMAL HIGH (ref 3.9–10.3)

## 2018-01-27 LAB — SEDIMENTATION RATE: Sed Rate: 11 mm/hr (ref 0–22)

## 2018-01-27 LAB — TSH: TSH: 0.992 u[IU]/mL (ref 0.308–3.960)

## 2018-01-27 MED ORDER — HEPARIN SOD (PORK) LOCK FLUSH 100 UNIT/ML IV SOLN
500.0000 [IU] | Freq: Once | INTRAVENOUS | Status: AC | PRN
  Administered 2018-01-27: 500 [IU]
  Filled 2018-01-27: qty 5

## 2018-01-27 MED ORDER — SODIUM CHLORIDE 0.9% FLUSH
10.0000 mL | INTRAVENOUS | Status: DC | PRN
  Administered 2018-01-27: 10 mL
  Filled 2018-01-27: qty 10

## 2018-01-27 MED ORDER — SODIUM CHLORIDE 0.9 % IV SOLN
200.0000 mg | Freq: Once | INTRAVENOUS | Status: AC
  Administered 2018-01-27: 200 mg via INTRAVENOUS
  Filled 2018-01-27: qty 8

## 2018-01-27 MED ORDER — SODIUM CHLORIDE 0.9% FLUSH
10.0000 mL | INTRAVENOUS | Status: DC | PRN
  Administered 2018-01-27: 10 mL via INTRAVENOUS
  Filled 2018-01-27: qty 10

## 2018-01-27 MED ORDER — MIRTAZAPINE 15 MG PO TABS: 15.0000 mg | ORAL_TABLET | Freq: Every day | ORAL | 3 refills | Status: DC

## 2018-01-27 MED ORDER — SODIUM CHLORIDE 0.9 % IV SOLN
Freq: Once | INTRAVENOUS | Status: AC
  Administered 2018-01-27: 15:00:00 via INTRAVENOUS
  Filled 2018-01-27: qty 250

## 2018-01-27 NOTE — Progress Notes (Signed)
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  Due to her prior history of recurrent disease, she is in agreement to go on maintenance therapy every 4 weeks.  Inflammatory arthritis She has recent diffuse inflammatory arthritis that responded well to prednisone therapy I will also check serum vitamin D level, result pending For now, she will continue to take tramadol and I reminded her not to exceed the total prescribed dose.  Hypokalemia She has mild hypokalemia, due to poor oral intake Recommend potassium rich diet  Anorexia She has lost some weight with anorexia I recommend a trial of Remeron   No orders of the defined types were placed in this encounter.   INTERVAL HISTORY: Please see below for problem oriented charting. 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    05/07/2014 Imaging     CT scan of the chest show right thoracic inlet lymphadenopathy    06/07/2014 Procedure    She underwent ultrasound-guided core biopsy of the neck lymph node    06/07/2014 Pathology Results    Accession: [id number] biopsy confirmed diagnosis of Hodgkin lymphoma.    06/15/2014 Imaging    Echocardiogram showed preserved ejection fraction    07/09/2014 - 07/12/2014 Hospital Admission    She was admitted to the hospital for severe anemia.    07/27/2014 Procedure    She had placement of port    07/31/2014 - 09/11/2014 Chemotherapy    She received dose adjusted chemotherapy due to abnormal liver function tests and severe anemia. Treatment was delayed due to noncompliance  and subsequently stopped because the patient failed to keep appointments    01/11/2015 Imaging    Repeat PET CT scan showed response to treatment    01/28/2015 - 06/18/2015 Chemotherapy    ABVD was restarted with full dose.    02/08/2015 - 02/10/2015 Hospital Admission    The patient was admitted to the hospital due to pancytopenia and profuse diarrhea. Cultures were negative. She was placed on ciprofloxacin.    02/11/2015 Adverse Reaction    Treatment was placed on hold due to recent infection.    04/11/2015 Imaging    PET CT scan showed near complete response. Incidental finding of an abnormal bone lesion, indeterminate. She is not symptomatic. Recommendation from [facility] to observe    07/11/2015 Imaging    PET CT scan showed abnormal new bone lesions, suggestive of possible disease progression    07/23/2015 Bone Marrow Biopsy    She underwent bone biopsy    07/23/2015 Pathology Results  Accession: [id number]  biopsy was negative for cancer    11/21/2015 Surgery    She had surgery for ectopic pregnancy    01/29/2016 Imaging    Ct chest, abdomen and pelvis showed pelvic and retroperitoneal lymphadenopathy, as above, concerning for residual disease. There is also a mildly enlarged posterior mediastinal lymph node measuring 11  mm adjacent to the distal descending thoracic aorta. This may represent an additional focus of disease, but is the only finding of concern in the thorax on today's examination. Sclerosis in the right ilium at site of previously noted metabolically active lesion, grossly unchanged. No other definite osseous lesions are identified on today's examination. Spleen is normal in size and appearance.    02/14/2016 PET scan    Interval disease worsening with new foci of hypermetabolic activity in multiple retroperitoneal and pelvic lymph nodes as well as AP window and left hilar lymph nodes. (Deauville 5). There is also overall worsening of the osseous disease.    05/11/2016 Pathology Results    Diagnosis Lymph node, needle/core biopsy, Left para-aortic retroperitoneal - CLASSICAL HODGKIN LYMPHOMA. - SEE ONCOLOGY TABLE. Microscopic Comment LYMPHOMA Histologic type: Classical Hodgkin lymphoma. Grade (if applicable): N/A Flow cytometry: Not done. Immunohistochemical stains: CD15, CD20, CD3, LCA, PAX-5, CD30 with appropriate controls. Touch preps/imprints: Not performed. Comments: The sections show small needle core biopsy fragments displaying a polymorphous cellular proliferation of small lymphocytes, plasma cells, eosinophils, and large atypical mononuclear and multilobated lymphoid cells with features of Reed-Sternberg cells and variants. This is associated with patchy areas of fibrosis. Immunohistochemical stains were performed and show that the large atypical lymphoid cells are positive for CD30, CD15 and PAX-5 and negative for LCA, CD20, CD3. The small lymphoid cells in the background show a mixture of T and B cells with predominance of T cells. The overall morphologic and histologic features are consistent with classical Hodgkin lymphoma. Further subtyping is challenging in limited small biopsy fragments but the patchy fibrosis suggests nodular sclerosis type.     05/11/2016 Procedure    She underwent  CT guided biopsy of retroperitoneal lymph node    05/26/2016 Procedure    Successful placement of a right IJ approach Power Port with ultrasound and fluoroscopic guidance. The catheter is ready for use.    06/02/2016 PET scan    Mixed response to chemotherapy with some lymph nodes decreased in metabolic activity and some lymph nodes increase metabolic activity. Lymph node stations including mediastinum, periaortic retroperitoneum, and obturator node stations. Activity is remains relatively intense Deauville 4 2. LEFT infrahilar nodule / lymph node with intense metabolic activity decreased from prior. ( Deauville 4 ). 3. 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 Deauville 5    06/03/2016 - 06/05/2016 Hospital Admission    She was admitted to the hospital for cycle 1 of ICE chemotherapy    06/24/2016 - 06/26/2016 Hospital Admission    She received cycle 2 of ICE chemo    07/07/2016 PET scan    Resolution of prior hypermetabolic adenopathy and resolution of prior osseous foci of hypermetabolic activity compatible with essentially complete response to therapy (Deauville 1). 2. Generalized reduced activity in the L4 vertebral body and the type of finding which would typically reflect prior radiation therapy. 3. Stable septated fatty right pelvic lesion, possibly a dermoid or lipoma, not hypermetabolic    01/18/2682 PET scan    Hypermetabolic lesion along the L3 vertebral body and left posterior elements, max SUV 7.8 (Deauville 5). Hypermetabolic lesion along the left  inferior pubic ramus, max SUV 4.9 (Deauville 4).  IMPRESSION: Prevascular lymphadenopathy, reflecting nodal recurrence (Deauville 4). Hypermetabolic osseous metastases involving the L3 vertebral body/posterior elements and left inferior pubic ramus (Deauville 4-5). Hypermetabolism along the endometrium, new, possibly reactive/physiologic. Consider pelvic ultrasound and/or endometrial sampling as clinically  warranted.    11/04/2016 - 02/24/2017 Chemotherapy    She received Brentuximab    12/11/2016 PET scan    1. Mixed response to therapy within the skeleton. Lesions at L3 is decreased in size and metabolic activity. Residual activity remains above liver ( Deauville 4) 2. Increased activity in the LEFT sacrum with metabolic activity above liver activity ( Deauville 4 3. Decrease in size and metabolic activity of anterior mediastinal tissue consistent with resolution of thymic hyperplasia or resolution of lymphoma metabolic activity ( Deauville 2). 4. No new lymphadenopathy. Normal spleen and liver.    03/25/2017 PET scan    1. Increased metabolic activity in several normal sized retroperitoneal and right pelvic lymph nodes, Deauville 4. 2. Increase in size and metabolic activity of lesions in the L3 and L4 vertebral bodies and in the left sacrum, Deauville 5. 3. No recurrence of anterior mediastinal abnormal activity. 4. Other imaging findings of potential clinical significance: Chronic ethmoid and left maxillary sinusitis. Paraseptal emphysema at the lung apices. Right ovarian dermoid.    04/06/2017 -  Chemotherapy    The patient had Keytruda    06/09/2017 PET scan    1. Overall significant improvement with reduction in nodal activity. Several retroperitoneal nodes which were previously Deauville 4 are currently Deauville 3. The right common iliac lymph node remains at Deauville 4 with maximum SUV of 3.4, but is improved from prior SUV of 4.3. 2. The bony lesions show the greatest improvement, with the previously Deauville 5 hypermetabolic lesions currently no longer of higher metabolic activity than surrounding bone marrow, currently measuring at Deauville 3. 3. 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 4. No new lesions are identified. 5. Emphysema (ICD10-J43.9).    08/05/2017 Imaging    1. Somewhat complex left adnexal lesion, potentially  hemorrhagic cyst measuring 2.9 x 2.8 cm. The possibility of torsion in the left ovary must be considered. Advise correlation with pelvic ultrasound including Doppler assessment to further evaluate.  2.  Right ovarian dermoid, unchanged from recent PET-CT examination.  3. No adenopathy by size criteria evident. Appearance of subcentimeter mesenteric lymph nodes is stable compared to recent study.  4. No bone lesions appreciable by CT. Areas of abnormal radiotracer uptake in the mid lumbar spine noted on recent PET-CT examination. No destruction 2 or lytic change noted in the lumbar vertebrae currently there.  5. No bowel obstruction. No abscess. No periappendiceal region inflammation.  6.  Small hiatal hernia with fluid in distal esophagus.  7.  Foci of coronary artery calcification, advanced for age.  8.  Spleen normal in size and contour.  9. Small hiatal hernia with mild fluid in the distal esophagus, likely indicative of a degree of reflux.    01/04/2018 Imaging    1. No findings to suggest residual/recurrent lymphoma in the chest, abdomen or pelvis. 2. Right ovarian dermoid slightly larger than prior examinations, currently measuring 3.8 x 2.8 x 3.1 cm. 3. Additional incidental findings, as above.     REVIEW OF SYSTEMS:   Constitutional: Denies fevers, chills  Eyes: Denies blurriness of vision Ears, nose, mouth, throat, and face: Denies mucositis or sore throat Respiratory: Denies cough, dyspnea or wheezes Cardiovascular: Denies  palpitation, chest discomfort or lower extremity swelling Gastrointestinal:  Denies nausea, heartburn or change in bowel habits Skin: Denies abnormal skin rashes Lymphatics: Denies new lymphadenopathy or easy bruising Neurological:Denies numbness, tingling or new weaknesses Behavioral/Psych: Mood is stable, no new changes  All other systems were reviewed with the patient and are negative.  I have reviewed the past medical history, past  surgical history, social history and family history with the patient and they are unchanged from previous note.  ALLERGIES:  is allergic to temazepam.  MEDICATIONS:  Current Outpatient Medications  Medication Sig Dispense Refill  . atazanavir (REYATAZ) 300 MG capsule Take 1 capsule (300 mg total) by mouth daily with breakfast. Take with Norvir. 30 capsule 5  . cholecalciferol (VITAMIN D) 1000 units tablet Take 1 tablet (1,000 Units total) by mouth daily. 30 tablet 9  . emtricitabine-tenofovir (TRUVADA) 200-300 MG tablet Take 1 tablet by mouth daily. 30 tablet 5  . mirtazapine (REMERON) 15 MG tablet Take 1 tablet (15 mg total) by mouth at bedtime. 30 tablet 3  . ondansetron (ZOFRAN ODT) 8 MG disintegrating tablet Take 1 tablet (8 mg total) by mouth every 8 (eight) hours as needed for nausea. 20 tablet 0  . Polyvinyl Alcohol-Povidone (CLEAR EYES ALL SEASONS OP) Place 1 drop into both eyes daily.    . ritonavir (NORVIR) 100 MG TABS tablet Take 1 tablet by mouth every morning with breakfast. Take with Reyataz. 30 tablet 5  . traMADol (ULTRAM) 50 MG tablet Take 2 tablets (100 mg total) by mouth every 6 (six) hours as needed for moderate pain. 90 tablet 0  . valACYclovir (VALTREX) 1000 MG tablet Take 1 tablet (1,000 mg total) by mouth 2 (two) times daily. 14 tablet 0   No current facility-administered medications for this visit.     PHYSICAL EXAMINATION: ECOG PERFORMANCE STATUS: 1 - Symptomatic but completely ambulatory  Vitals:   01/27/18 1248  BP: 111/72  Pulse: 66  Resp: 18  Temp: 98.2 F (36.8 C)  SpO2: 100%   Filed Weights   01/27/18 1248  Weight: 113 lb 3.2 oz (51.3 kg)    GENERAL:alert, no distress and comfortable SKIN: skin color, texture, turgor are normal, no rashes or significant lesions EYES: normal, Conjunctiva are pink and non-injected, sclera clear OROPHARYNX:no exudate, no erythema and lips, buccal mucosa, and tongue normal  NECK: supple, thyroid normal size,  non-tender, without nodularity LYMPH:  no palpable lymphadenopathy in the cervical, axillary or inguinal LUNGS: clear to auscultation and percussion with normal breathing effort HEART: regular rate & rhythm and no murmurs and no lower extremity edema ABDOMEN:abdomen soft, non-tender and normal bowel sounds Musculoskeletal:no cyanosis of digits and no clubbing  NEURO: alert & oriented x 3 with fluent speech, no focal motor/sensory deficits  LABORATORY DATA:  I have reviewed the data as listed    Component Value Date/Time   NA 141 01/27/2018 1211   NA 140 04/29/2017 0745   K 3.0 (LL) 01/27/2018 1211   K 3.3 (L) 04/29/2017 0745   CL 106 01/27/2018 1211   CO2 25 01/27/2018 1211   CO2 23 04/29/2017 0745   GLUCOSE 98 01/27/2018 1211   GLUCOSE 83 04/29/2017 0745   BUN 11 01/27/2018 1211   BUN 10.7 04/29/2017 0745   CREATININE 0.99 01/27/2018 1211   CREATININE 1.2 (H) 04/29/2017 0745   CALCIUM 9.4 01/27/2018 1211   CALCIUM 9.8 04/29/2017 0745   PROT 6.8 01/27/2018 1211   PROT 7.6 04/29/2017 0745   ALBUMIN 4.1 01/27/2018  1211   ALBUMIN 4.0 04/29/2017 0745   AST 13 (L) 01/27/2018 1211   AST 19 04/29/2017 0745   ALT 17 01/27/2018 1211   ALT 19 04/29/2017 0745   ALKPHOS 76 01/27/2018 1211   ALKPHOS 81 04/29/2017 0745   BILITOT 1.0 01/27/2018 1211   BILITOT 1.31 (H) 04/29/2017 0745   GFRNONAA >60 01/27/2018 1211   GFRNONAA 83 02/20/2016 0908   GFRAA >60 01/27/2018 1211   GFRAA >89 02/20/2016 0908    No results found for: SPEP, UPEP  Lab Results  Component Value Date   WBC 11.5 (H) 01/27/2018   NEUTROABS 7.9 (H) 01/27/2018   HGB 13.3 01/27/2018   HCT 39.2 01/27/2018   MCV 103.6 (H) 01/27/2018   PLT 247 01/27/2018      Chemistry      Component Value Date/Time   NA 141 01/27/2018 1211   NA 140 04/29/2017 0745   K 3.0 (LL) 01/27/2018 1211   K 3.3 (L) 04/29/2017 0745   CL 106 01/27/2018 1211   CO2 25 01/27/2018 1211   CO2 23 04/29/2017 0745   BUN 11 01/27/2018 1211    BUN 10.7 04/29/2017 0745   CREATININE 0.99 01/27/2018 1211   CREATININE 1.2 (H) 04/29/2017 0745      Component Value Date/Time   CALCIUM 9.4 01/27/2018 1211   CALCIUM 9.8 04/29/2017 0745   ALKPHOS 76 01/27/2018 1211   ALKPHOS 81 04/29/2017 0745   AST 13 (L) 01/27/2018 1211   AST 19 04/29/2017 0745   ALT 17 01/27/2018 1211   ALT 19 04/29/2017 0745   BILITOT 1.0 01/27/2018 1211   BILITOT 1.31 (H) 04/29/2017 0745       RADIOGRAPHIC STUDIES: I have personally reviewed the radiological images as listed and agreed with the findings in the report. Ct Chest W Contrast  Result Date: 01/04/2018 CLINICAL DATA:  32 year old female with history of Hodgkin's lymphoma diagnosed in 2016. Ongoing chemotherapy. Follow-up study. EXAM: CT CHEST, ABDOMEN, AND PELVIS WITH CONTRAST TECHNIQUE: Multidetector CT imaging of the chest, abdomen and pelvis was performed following the standard protocol during bolus administration of intravenous contrast. CONTRAST:  70m OMNIPAQUE IOHEXOL 300 MG/ML  SOLN COMPARISON:  CT the abdomen and pelvis 08/05/2017. PET-CT 06/09/2017. Chest CT 05/07/2014. FINDINGS: CT CHEST FINDINGS Cardiovascular: Heart size is normal. There is no significant pericardial fluid, thickening or pericardial calcification. No atherosclerotic calcifications noted in the thoracic aorta or the coronary arteries. Right internal jugular single-lumen porta cath with tip terminating at the superior cavoatrial junction. Mediastinum/Nodes: No pathologically enlarged mediastinal or hilar lymph nodes. Esophagus is unremarkable in appearance. No axillary lymphadenopathy. Lungs/Pleura: 3 mm subpleural nodule in the periphery of the right middle lobe (axial image 69 of series 6), unchanged compared to prior examination dating back to 05/07/2014, considered definitively benign, presumably a subpleural lymph node. No other larger more suspicious appearing pulmonary nodules or masses are noted. No acute consolidative  airspace disease. No pleural effusions. Musculoskeletal: There are no aggressive appearing lytic or blastic lesions noted in the visualized portions of the skeleton. CT ABDOMEN PELVIS FINDINGS Hepatobiliary: No suspicious cystic or solid hepatic lesions. No intra or extrahepatic biliary ductal dilatation. Gallbladder is normal in appearance. Pancreas: No pancreatic mass. No pancreatic ductal dilatation. No pancreatic or peripancreatic fluid or inflammatory changes. Spleen: Normal in size and unremarkable in appearance. Adrenals/Urinary Tract: Bilateral kidneys and bilateral adrenal glands are normal in appearance. No hydroureteronephrosis. Urinary bladder is normal in appearance. Stomach/Bowel: Normal appearance of the stomach. No pathologic dilatation  of small bowel or colon. Normal appendix. Vascular/Lymphatic: No significant atherosclerotic disease, aneurysm or dissection noted in the abdominal or pelvic vasculature. No lymphadenopathy noted in the abdomen or pelvis. Reproductive: Uterus and left ovary are unremarkable in appearance. In the right adnexa there is a 3.8 x 2.8 x 3.1 cm fatty attenuation lesion with some internal soft tissue attenuation, presumably an ovarian dermoid. This is only slightly larger than the prior examination from 08/05/2017. Other: No significant volume of ascites.  No pneumoperitoneum. Musculoskeletal: There are no aggressive appearing lytic or blastic lesions noted in the visualized portions of the skeleton. IMPRESSION: 1. No findings to suggest residual/recurrent lymphoma in the chest, abdomen or pelvis. 2. Right ovarian dermoid slightly larger than prior examinations, currently measuring 3.8 x 2.8 x 3.1 cm. 3. Additional incidental findings, as above. Electronically Signed   By: Vinnie Langton M.D.   On: 01/04/2018 11:03   Ct Abdomen Pelvis W Contrast  Result Date: 01/04/2018 CLINICAL DATA:  32 year old female with history of Hodgkin's lymphoma diagnosed in 2016. Ongoing  chemotherapy. Follow-up study. EXAM: CT CHEST, ABDOMEN, AND PELVIS WITH CONTRAST TECHNIQUE: Multidetector CT imaging of the chest, abdomen and pelvis was performed following the standard protocol during bolus administration of intravenous contrast. CONTRAST:  48m OMNIPAQUE IOHEXOL 300 MG/ML  SOLN COMPARISON:  CT the abdomen and pelvis 08/05/2017. PET-CT 06/09/2017. Chest CT 05/07/2014. FINDINGS: CT CHEST FINDINGS Cardiovascular: Heart size is normal. There is no significant pericardial fluid, thickening or pericardial calcification. No atherosclerotic calcifications noted in the thoracic aorta or the coronary arteries. Right internal jugular single-lumen porta cath with tip terminating at the superior cavoatrial junction. Mediastinum/Nodes: No pathologically enlarged mediastinal or hilar lymph nodes. Esophagus is unremarkable in appearance. No axillary lymphadenopathy. Lungs/Pleura: 3 mm subpleural nodule in the periphery of the right middle lobe (axial image 69 of series 6), unchanged compared to prior examination dating back to 05/07/2014, considered definitively benign, presumably a subpleural lymph node. No other larger more suspicious appearing pulmonary nodules or masses are noted. No acute consolidative airspace disease. No pleural effusions. Musculoskeletal: There are no aggressive appearing lytic or blastic lesions noted in the visualized portions of the skeleton. CT ABDOMEN PELVIS FINDINGS Hepatobiliary: No suspicious cystic or solid hepatic lesions. No intra or extrahepatic biliary ductal dilatation. Gallbladder is normal in appearance. Pancreas: No pancreatic mass. No pancreatic ductal dilatation. No pancreatic or peripancreatic fluid or inflammatory changes. Spleen: Normal in size and unremarkable in appearance. Adrenals/Urinary Tract: Bilateral kidneys and bilateral adrenal glands are normal in appearance. No hydroureteronephrosis. Urinary bladder is normal in appearance. Stomach/Bowel: Normal  appearance of the stomach. No pathologic dilatation of small bowel or colon. Normal appendix. Vascular/Lymphatic: No significant atherosclerotic disease, aneurysm or dissection noted in the abdominal or pelvic vasculature. No lymphadenopathy noted in the abdomen or pelvis. Reproductive: Uterus and left ovary are unremarkable in appearance. In the right adnexa there is a 3.8 x 2.8 x 3.1 cm fatty attenuation lesion with some internal soft tissue attenuation, presumably an ovarian dermoid. This is only slightly larger than the prior examination from 08/05/2017. Other: No significant volume of ascites.  No pneumoperitoneum. Musculoskeletal: There are no aggressive appearing lytic or blastic lesions noted in the visualized portions of the skeleton. IMPRESSION: 1. No findings to suggest residual/recurrent lymphoma in the chest, abdomen or pelvis. 2. Right ovarian dermoid slightly larger than prior examinations, currently measuring 3.8 x 2.8 x 3.1 cm. 3. Additional incidental findings, as above. Electronically Signed   By: DMauri BrooklynD.  On: 01/04/2018 11:03    All questions were answered. The patient knows to call the clinic with any problems, questions or concerns. No barriers to learning was detected.  I spent 15 minutes counseling the patient face to face. The total time spent in the appointment was 20 minutes and more than 50% was on counseling and review of test results  Heath Lark, MD 01/27/2018 2:06 PM

## 2018-01-27 NOTE — Assessment & Plan Note (Signed)
She has recent diffuse inflammatory arthritis that responded well to prednisone therapy I will also check serum vitamin D level, result pending For now, she will continue to take tramadol and I reminded her not to exceed the total prescribed dose.

## 2018-01-27 NOTE — Assessment & Plan Note (Signed)
She has lost some weight with anorexia I recommend a trial of Remeron

## 2018-01-27 NOTE — Assessment & Plan Note (Signed)
I have reviewed of CT imaging with the patient She has complete response to therapy Her symptoms of inflammatory arthritis is due to checkpoint inhibitor side effects She has responded well to intermittent low-dose prednisone therapy We discussed the risk and benefits of continuing treatment.  Due to her prior history of recurrent disease, she is in agreement to go on maintenance therapy every 4 weeks.

## 2018-01-27 NOTE — Progress Notes (Signed)
Given copy of CMP results in lobby. Per Dr. Alvy Bimler given and instructed to eat a potassium rich diet. She verbalized understanding.

## 2018-01-27 NOTE — Assessment & Plan Note (Signed)
She has mild hypokalemia, due to poor oral intake Recommend potassium rich diet

## 2018-01-27 NOTE — Patient Instructions (Signed)
[facility] Discharge Instructions for Patients Receiving Chemotherapy  Today you received the following chemotherapy agents :  Keytruda.  To help prevent nausea and vomiting after your treatment, we encourage you to take your nausea medication as prescribed.   If you develop nausea and vomiting that is not controlled by your nausea medication, call the clinic.   BELOW ARE SYMPTOMS THAT SHOULD BE REPORTED IMMEDIATELY:  *FEVER GREATER THAN 100.5 F  *CHILLS WITH OR WITHOUT FEVER  NAUSEA AND VOMITING THAT IS NOT CONTROLLED WITH YOUR NAUSEA MEDICATION  *UNUSUAL SHORTNESS OF BREATH  *UNUSUAL BRUISING OR BLEEDING  TENDERNESS IN MOUTH AND THROAT WITH OR WITHOUT PRESENCE OF ULCERS  *URINARY PROBLEMS  *BOWEL PROBLEMS  UNUSUAL RASH Items with * indicate a potential emergency and should be followed up as soon as possible.  Feel free to call the clinic should you have any questions or concerns. The clinic phone number is [phone].  Please show the CHEMO ALERT CARD at check-in to the Emergency Department and triage nurse.  

## 2018-01-28 LAB — VITAMIN D 25 HYDROXY (VIT D DEFICIENCY, FRACTURES): Vit D, 25-Hydroxy: 46.6 ng/mL (ref 30.0–100.0)

## 2018-01-31 ENCOUNTER — Telehealth: Payer: Self-pay

## 2018-01-31 ENCOUNTER — Other Ambulatory Visit: Payer: Self-pay | Admitting: Hematology and Oncology

## 2018-01-31 DIAGNOSIS — B20 Human immunodeficiency virus [HIV] disease: Secondary | ICD-10-CM

## 2018-01-31 MED ORDER — TRAMADOL HCL 50 MG PO TABS: 100.0000 mg | ORAL_TABLET | Freq: Four times a day (QID) | ORAL | 0 refills | Status: DC | PRN

## 2018-01-31 NOTE — Telephone Encounter (Signed)
She called and left message requesting refill on Tramadol.

## 2018-01-31 NOTE — Telephone Encounter (Signed)
Sent to pharmacy 

## 2018-02-10 ENCOUNTER — Telehealth: Payer: Self-pay

## 2018-02-10 ENCOUNTER — Ambulatory Visit (HOSPITAL_COMMUNITY)
Admission: RE | Admit: 2018-02-10 | Discharge: 2018-02-10 | Disposition: A | Discharge status: Home / Self Care | Payer: 59 | Source: Ambulatory Visit | Attending: Medical | Admitting: Medical

## 2018-02-10 ENCOUNTER — Inpatient Hospital Stay (HOSPITAL_BASED_OUTPATIENT_CLINIC_OR_DEPARTMENT_OTHER): Payer: 59 | Admitting: Medical

## 2018-02-10 ENCOUNTER — Other Ambulatory Visit: Payer: Self-pay | Admitting: Hematology and Oncology

## 2018-02-10 ENCOUNTER — Inpatient Hospital Stay: Payer: 59

## 2018-02-10 ENCOUNTER — Ambulatory Visit (HOSPITAL_COMMUNITY)
Admission: EM | Admit: 2018-02-10 | Discharge: 2018-02-10 | Disposition: A | Discharge status: Other Acute Inpatient Hospital | Payer: 59

## 2018-02-10 VITALS — BP 119/86 | HR 69 | Temp 98.6°F | Resp 18 | Ht 64.0 in | Wt 114.1 lb

## 2018-02-10 DIAGNOSIS — C8118 Nodular sclerosis classical Hodgkin lymphoma, lymph nodes of multiple sites: Secondary | ICD-10-CM

## 2018-02-10 DIAGNOSIS — M542 Cervicalgia: Secondary | ICD-10-CM

## 2018-02-10 DIAGNOSIS — B20 Human immunodeficiency virus [HIV] disease: Secondary | ICD-10-CM

## 2018-02-10 DIAGNOSIS — M7989 Other specified soft tissue disorders: Secondary | ICD-10-CM

## 2018-02-10 DIAGNOSIS — R221 Localized swelling, mass and lump, neck: Secondary | ICD-10-CM | POA: Diagnosis not present

## 2018-02-10 DIAGNOSIS — Z5112 Encounter for antineoplastic immunotherapy: Secondary | ICD-10-CM | POA: Diagnosis not present

## 2018-02-10 DIAGNOSIS — L03221 Cellulitis of neck: Secondary | ICD-10-CM

## 2018-02-10 LAB — CBC WITH DIFFERENTIAL (CANCER CENTER ONLY)
Abs Immature Granulocytes: 0.02 10*3/uL (ref 0.00–0.07)
Basophils Absolute: 0 10*3/uL (ref 0.0–0.1)
Basophils Relative: 1 %
EOS ABS: 0.1 10*3/uL (ref 0.0–0.5)
EOS PCT: 2 %
HEMATOCRIT: 39.6 % (ref 36.0–46.0)
Hemoglobin: 13.9 g/dL (ref 12.0–15.0)
IMMATURE GRANULOCYTES: 0 %
LYMPHS ABS: 1.9 10*3/uL (ref 0.7–4.0)
Lymphocytes Relative: 32 %
MCH: 34.8 pg — AB (ref 26.0–34.0)
MCHC: 35.1 g/dL (ref 30.0–36.0)
MCV: 99.2 fL (ref 80.0–100.0)
MONO ABS: 0.6 10*3/uL (ref 0.1–1.0)
MONOS PCT: 10 %
Neutro Abs: 3.3 10*3/uL (ref 1.7–7.7)
Neutrophils Relative %: 55 %
Platelet Count: 269 10*3/uL (ref 150–400)
RBC: 3.99 MIL/uL (ref 3.87–5.11)
RDW: 12.7 % (ref 11.5–15.5)
WBC Count: 6 10*3/uL (ref 4.0–10.5)
nRBC: 0 % (ref 0.0–0.2)

## 2018-02-10 MED ORDER — TRAMADOL HCL 50 MG PO TABS: 100.0000 mg | ORAL_TABLET | Freq: Four times a day (QID) | ORAL | 0 refills | Status: DC | PRN

## 2018-02-10 MED ORDER — CEPHALEXIN 500 MG PO CAPS: 500.0000 mg | ORAL_CAPSULE | Freq: Three times a day (TID) | ORAL | 0 refills | Status: DC

## 2018-02-10 NOTE — Telephone Encounter (Signed)
Called and left a message asking her to call the office. Per Dr. Alvy Bimler, Tramadol Rx sent to pharmacy. Ask if she could come in and see Sandi Mealy, PA today.

## 2018-02-10 NOTE — Progress Notes (Signed)
*  Preliminary Results* Left upper extremity venous duplex completed. Left upper extremity is negative for deep and superficial vein thrombosis.  02/10/2018 1:26 PM  Maudry Mayhew, MHA, RVT, RDCS, RDMS

## 2018-02-10 NOTE — Telephone Encounter (Signed)
Called and told her to continue Remeron as ordered, per Dr. Alvy Bimler. She verbalized understanding.

## 2018-02-10 NOTE — Telephone Encounter (Signed)
Given below message. Scheduling message sent for appt with V. Tanner, PA today. She verbalized understanding.

## 2018-02-10 NOTE — Progress Notes (Addendum)
[facility] Progress Note   [name] [id number] 1985/10/08 32 y.o.  [name] is managed by Dr. Heath Lark  Actively treated with chemotherapy/immunotherapy: yes  Current Therapy: Keytruda  Last Treated:   01/27/2018 (cycle 13, day 1)  Assessment: Plan:    Left arm swelling - Plan: VAS [place] UPPER EXTREMITY VENOUS DUPLEX  Neck swelling - Plan: VAS [place] UPPER EXTREMITY VENOUS DUPLEX  Neck pain - Plan: VAS [place] UPPER EXTREMITY VENOUS DUPLEX  Cellulitis of neck   Subjective left arm swelling and left neck pain and swelling: The patient was referred for a venous Doppler ultrasound of her left upper with results showing: Left upper extremity venous duplex completed. Left upper extremity is negative for deep and superficial vein thrombosis.  Suspected cellulitis of the left neck: The patient was given a prescription for Keflex 500 mg p.o. 3 times daily x7 days.  Please see After Visit Summary for patient specific instructions.  Future Appointments  Date Time Provider [place]  02/14/2018  8:45 AM [place] Callas, NP [facility]  02/24/2018  9:15 AM [facility] LAB 3 [facility] None  02/24/2018  9:30 AM [facility] [place] FLUSH [facility] None  02/24/2018 10:00 AM Heath Lark, MD [facility] None  02/24/2018 11:00 AM [facility] INFUSION [facility] None  05/31/2018  8:45 AM Comer, Okey Regal, MD [facility]    No orders of the defined types were placed in this encounter.      Subjective:   Patient ID:  [name] is a 32 y.o. (DOB 1985/06/17) female.  Chief Complaint: No chief complaint on file.   HPI [name] is a 32 year old female with a diagnosis of a nodular sclerosing Hodgkin's lymphoma who is managed by Dr. Heath Lark and is currently treated with Keytruda having completed cycle 13 on 01/27/2018.  She presents to the office today with a report of tenderness, swelling, and erythema over her left neck.  She denies any  injuries.  She has had no changes in activity.  She states that she has had some shortness of breath and a productive cough with brownish sputum.  Her port is in her right chest wall.  She denies fevers, chills, or sweats.  Medications: I have reviewed the patient's current medications.  Allergies:  Allergies  Allergen Reactions  . Temazepam Other (See Comments)    Reaction:  Confusion/dizziness     Past Medical History:  Diagnosis Date  . AIN III (anal intraepithelial neoplasia III)   . Anemia   . Cancer ([place])    Hodgkin lymphoma  . Chest wall pain 06/27/2015  . Condyloma acuminatum in female   . Depression   . History of chronic bronchitis   . History of esophagitis    CANDIDA  . History of shingles   . HIV (human immunodeficiency virus infection) ([place])   . Hodgkin's lymphoma ([place]) 06/12/2014  . HSV (herpes simplex virus) infection   . Hypokalemia 07/17/2014  . Periodontitis, chronic   . Screening examination for venereal disease 10/30/2013    Past Surgical History:  Procedure Laterality Date  . DIAGNOSTIC LAPAROSCOPY WITH REMOVAL OF ECTOPIC PREGNANCY N/A 11/17/2015   Procedure: LAPAROSCOPY LEFT  SALPINGECTOMY SECONDARY TO LEFT ECTOPIC PREGNANCY;  Surgeon: Jonnie Kind, MD;  Location: [facility];  Service: Gynecology;  Laterality: N/A;  . DILATION AND CURETTAGE OF UTERUS  2005   MISSED AB  . EXAMINATION UNDER ANESTHESIA N/A 09/23/2012   Procedure: EXAM UNDER ANESTHESIA;  Surgeon: Revonda Standard.  Gross, MD;  Location: [facility];  Service: General;  Laterality: N/A;  . IR GENERIC HISTORICAL  05/26/2016   IR FLUORO GUIDE PORT INSERTION RIGHT 05/26/2016 [facility]  . IR GENERIC HISTORICAL  05/26/2016   IR US GUIDE VASC ACCESS RIGHT 05/26/2016 [facility]  . LASER ABLATION CONDOLAMATA N/A 09/23/2012   Procedure: REMOVAL/ABLATION  ABLATION CONDOLAMATA WARTS;  Surgeon: Adin Hector, MD;  Location: [facility];  Service: General;  Laterality: N/A;     Family History  Problem Relation Age of Onset  . Cancer Maternal Aunt        unknown ca  . Cancer Maternal Grandmother        unknown ca    Social History   Socioeconomic History  . Marital status: Single    Spouse name: Not on file  . Number of children: Not on file  . Years of education: Not on file  . Highest education level: Not on file  Occupational History  . Not on file  Social Needs  . Financial resource strain: Not on file  . Food insecurity:    Worry: Not on file    Inability: Not on file  . Transportation needs:    Medical: Not on file    Non-medical: Not on file  Tobacco Use  . Smoking status: Light Tobacco Smoker    Packs/day: 0.25    Years: 7.00    Pack years: 1.75    Types: Cigars, Cigarettes    Start date: 03/19/2014  . Smokeless tobacco: Never Used  . Tobacco comment: she smokes 3 Black and Mild Cigars daily  Substance and Sexual Activity  . Alcohol use: Yes    Alcohol/week: 0.0 standard drinks    Comment: Occasionally  . Drug use: Yes    Types: Marijuana    Comment: 2 blunts per day  . Sexual activity: Yes    Partners: Male    Birth control/protection: None    Comment: pt. declined condoms  Lifestyle  . Physical activity:    Days per week: Not on file    Minutes per session: Not on file  . Stress: Not on file  Relationships  . Social connections:    Talks on phone: Not on file    Gets together: Not on file    Attends religious service: Not on file    Active member of club or organization: Not on file    Attends meetings of clubs or organizations: Not on file    Relationship status: Not on file  . Intimate partner violence:    Fear of current or ex partner: Not on file    Emotionally abused: Not on file    Physically abused: Not on file    Forced sexual activity: Not on file  Other Topics Concern  . Not on file  Social History Narrative  . Not on file    Past Medical History, Surgical history, Social history, and Family  history were reviewed and updated as appropriate.   Please see review of systems for further details on the patient's review from today.   Review of Systems:  Review of Systems  Constitutional: Negative for chills, diaphoresis and fever.  HENT: Negative for facial swelling, trouble swallowing and voice change.   Respiratory: Negative for cough, chest tightness, shortness of breath and wheezing.   Cardiovascular: Negative for chest pain and palpitations.  Gastrointestinal: Negative for abdominal pain, constipation, diarrhea, nausea and vomiting.  Musculoskeletal: Negative for back pain  and myalgias.  Skin:       Erythema, swelling, and tenderness of the left lateral neck.  Neurological: Negative for dizziness, light-headedness and headaches.    Objective:   Physical Exam:  BP 119/86 (BP Location: Right Arm, Patient Position: Sitting)   Pulse 69   Temp 98.6 F (37 C) (Oral)   Resp 18   Ht 5\' 4"  (1.626 m)   Wt 114 lb 1.6 oz (51.8 kg)   BMI 19.59 kg/m  ECOG: 0  Oxygen saturation at rest:   99% on room air Oxygen saturation with ambulation: 93% on room air  Physical Exam  Constitutional: No distress.  HENT:  Head: Normocephalic and atraumatic.  Neck: Normal range of motion.  Decubitus erythema and tenderness is noted over the left lateral neck.  The area appears to be slightly swollen.  Cardiovascular: Normal rate, regular rhythm and normal heart sounds. Exam reveals no gallop and no friction rub.  No murmur heard. Pulmonary/Chest: Effort normal and breath sounds normal. No respiratory distress. She has no wheezes. She has no rales.  Lymphadenopathy:    She has no cervical adenopathy.  Neurological: She is alert.  Skin: Skin is warm and dry. No rash noted. She is not diaphoretic. There is erythema (Mild diffuse erythema is noted over the left lateral neck.).    Lab Review:     Component Value Date/Time   NA 141 01/27/2018 1211   NA 140 04/29/2017 0745   K 3.0 (LL)  01/27/2018 1211   K 3.3 (L) 04/29/2017 0745   CL 106 01/27/2018 1211   CO2 25 01/27/2018 1211   CO2 23 04/29/2017 0745   GLUCOSE 98 01/27/2018 1211   GLUCOSE 83 04/29/2017 0745   BUN 11 01/27/2018 1211   BUN 10.7 04/29/2017 0745   CREATININE 0.99 01/27/2018 1211   CREATININE 1.2 (H) 04/29/2017 0745   CALCIUM 9.4 01/27/2018 1211   CALCIUM 9.8 04/29/2017 0745   PROT 6.8 01/27/2018 1211   PROT 7.6 04/29/2017 0745   ALBUMIN 4.1 01/27/2018 1211   ALBUMIN 4.0 04/29/2017 0745   AST 13 (L) 01/27/2018 1211   AST 19 04/29/2017 0745   ALT 17 01/27/2018 1211   ALT 19 04/29/2017 0745   ALKPHOS 76 01/27/2018 1211   ALKPHOS 81 04/29/2017 0745   BILITOT 1.0 01/27/2018 1211   BILITOT 1.31 (H) 04/29/2017 0745   GFRNONAA >60 01/27/2018 1211   GFRNONAA 83 02/20/2016 0908   GFRAA >60 01/27/2018 1211   GFRAA >89 02/20/2016 0908       Component Value Date/Time   WBC 6.0 02/10/2018 1014   WBC 11.5 (H) 01/27/2018 1211   RBC 3.99 02/10/2018 1014   HGB 13.9 02/10/2018 1014   HGB 13.1 04/29/2017 0745   HCT 39.6 02/10/2018 1014   HCT 38.1 04/29/2017 0745   PLT 269 02/10/2018 1014   PLT 247 04/29/2017 0745   MCV 99.2 02/10/2018 1014   MCV 102.8 (H) 04/29/2017 0745   MCH 34.8 (H) 02/10/2018 1014   MCHC 35.1 02/10/2018 1014   RDW 12.7 02/10/2018 1014   RDW 15.8 (H) 04/29/2017 0745   LYMPHSABS 1.9 02/10/2018 1014   LYMPHSABS 1.2 04/29/2017 0745   MONOABS 0.6 02/10/2018 1014   MONOABS 0.5 04/29/2017 0745   EOSABS 0.1 02/10/2018 1014   EOSABS 0.2 04/29/2017 0745   BASOSABS 0.0 02/10/2018 1014   BASOSABS 0.1 04/29/2017 0745   -------------------------------  Imaging from last 24 hours (if applicable):  Radiology interpretation: No results found.

## 2018-02-10 NOTE — Telephone Encounter (Signed)
She called and left a message.  Called back. She is requesting Tramadol refill sent to pharmacy. She is complaining redness and swelling on the left side of her neck up to her ear. Left ear numbness. This is all started 3 days ago. She thinks that it is from the Remeron. Her pharmacy told her to stop Remeron and contact the office.

## 2018-02-14 ENCOUNTER — Other Ambulatory Visit: Payer: Self-pay

## 2018-02-14 ENCOUNTER — Other Ambulatory Visit (HOSPITAL_COMMUNITY)
Admission: RE | Admit: 2018-02-14 | Discharge: 2018-02-14 | Disposition: A | Discharge status: Home / Self Care | Payer: 59 | Source: Ambulatory Visit | Attending: Infectious Diseases | Admitting: Infectious Diseases

## 2018-02-14 ENCOUNTER — Ambulatory Visit (INDEPENDENT_AMBULATORY_CARE_PROVIDER_SITE_OTHER): Payer: 59 | Admitting: Infectious Diseases

## 2018-02-14 ENCOUNTER — Encounter: Payer: Self-pay | Admitting: Infectious Diseases

## 2018-02-14 ENCOUNTER — Telehealth: Payer: Self-pay

## 2018-02-14 VITALS — BP 138/86 | HR 68 | Temp 98.2°F | Ht 64.0 in | Wt 114.0 lb

## 2018-02-14 DIAGNOSIS — Z1151 Encounter for screening for human papillomavirus (HPV): Secondary | ICD-10-CM | POA: Insufficient documentation

## 2018-02-14 DIAGNOSIS — K648 Other hemorrhoids: Secondary | ICD-10-CM

## 2018-02-14 DIAGNOSIS — R21 Rash and other nonspecific skin eruption: Secondary | ICD-10-CM

## 2018-02-14 DIAGNOSIS — Z124 Encounter for screening for malignant neoplasm of cervix: Secondary | ICD-10-CM | POA: Insufficient documentation

## 2018-02-14 DIAGNOSIS — D013 Carcinoma in situ of anus and anal canal: Secondary | ICD-10-CM

## 2018-02-14 MED ORDER — HYDROCORTISONE 2.5 % RE CREA: 1.0000 "application " | TOPICAL_CREAM | Freq: Two times a day (BID) | RECTAL | 1 refills | Status: DC

## 2018-02-14 MED ORDER — HYDROCORTISONE ACETATE 25 MG RE SUPP: 25.0000 mg | Freq: Two times a day (BID) | RECTAL | 1 refills | Status: DC

## 2018-02-14 NOTE — Telephone Encounter (Signed)
Patient left message stating her insurance will not cover her hydrocortisone cream. Patient is not sure what to do next and would like alternative mediation to be sent into pharmacy.  [name]

## 2018-02-14 NOTE — Addendum Note (Signed)
Addended by: [place] Callas on: 02/14/2018 01:21 PM   Modules accepted: Orders

## 2018-02-14 NOTE — Patient Instructions (Addendum)
Will send in a referral for you to see dermatology. There are a few things this can be and we may need to take a little piece of the tissue to make a diagnosis (skin biopsy).   For your hemorrhoid I will send in some suppositories that are medicated to help. Can also use over the counter preparation H cream or witch hazel.     Hemorrhoids Hemorrhoids are swollen veins in and around the rectum or anus. Hemorrhoids can cause pain, itching, or bleeding. Most of the time, they do not cause serious problems. They usually get better with diet changes, lifestyle changes, and other home treatments. Follow these instructions at home: Eating and drinking  Eat foods that have fiber, such as whole grains, beans, nuts, fruits, and vegetables. Ask your doctor about taking products that have added fiber (fibersupplements).  Drink enough fluid to keep your pee (urine) clear or pale yellow. For Pain and Swelling  Take a warm-water bath (sitz bath) for 20 minutes to ease pain. Do this 3-4 times a day.  If directed, put ice on the painful area. It may be helpful to use ice between your warm baths. ? Put ice in a plastic bag. ? Place a towel between your skin and the bag. ? Leave the ice on for 20 minutes, 2-3 times a day. General instructions  Take over-the-counter and prescription medicines only as told by your doctor. ? Medicated creams and medicines that are inserted into the anus (suppositories) may be used or applied as told.  Exercise often.  Go to the bathroom when you have the urge to poop (to have a bowel movement). Do not wait.  Avoid pushing too hard (straining) when you poop.  Keep the butt area dry and clean. Use wet toilet paper or moist paper towels.  Do not sit on the toilet for a long time. Contact a doctor if:  You have any of these: ? Pain and swelling that do not get better with treatment or medicine. ? Bleeding that will not stop. ? Trouble pooping or you cannot  poop. ? Pain or swelling outside the area of the hemorrhoids. This information is not intended to replace advice given to you by your health care provider. Make sure you discuss any questions you have with your health care provider. Document Released: 01/21/2008 Document Revised: 09/19/2015 Document Reviewed: 12/26/2014 Elsevier Interactive Patient Education  [employer].

## 2018-02-14 NOTE — Telephone Encounter (Signed)
Will try a cream to see if this is approved - it is called Anusol-HC. If insurance still denies it would have her try some over the counter preparation H cream to see if it helps with symptoms.   In looking more at her history I would like to have the general surgery team check her out for this as well. She should expect a call for a referral to see Dr. Leighton Ruff with central [facility] surgery. If she does not hear back from their office in 10-14d please call our clinic to have [place] follow up on referral.   Thank you, Luis.

## 2018-02-14 NOTE — Progress Notes (Signed)
Subjective:    [name] is a 32 y.o. female here for an annual pelvic exam and pap smear.   Review of Systems: Current GYN complaints or concerns: rash over perineum. Has been present over a year now but has never had it evaluated. She worries it is warts. She describes it to be scaly and pruritic. No drainage. Slightly raised at boarders but plaques are flat. No other area on her body. 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 She does not have periods 2/2 lupron injections for hodgkins lymphoma. She would one day like to have a baby - tells me she was pregnant but it was ectopic and had to have a fallopian tube removed surgically due to this.  Patient denies any abdominal/pelvic pain, problems with bowel movements, urination, vaginal discharge or intercourse.   Past Medical History:  Diagnosis Date  . AIN III (anal intraepithelial neoplasia III)   . Anemia   . Cancer ([place])    Hodgkin lymphoma  . Chest wall pain 06/27/2015  . Condyloma acuminatum in female   . Depression   . History of chronic bronchitis   . History of esophagitis    CANDIDA  . History of shingles   . HIV (human immunodeficiency virus infection) ([place])   . Hodgkin's lymphoma ([place]) 06/12/2014  . HSV (herpes simplex virus) infection   . Hypokalemia 07/17/2014  . Periodontitis, chronic   . Screening examination for venereal disease 10/30/2013    Gynecologic History: [id number]  No LMP recorded. Patient has had an injection. Contraception: lupron induced menopause  Last Pap: "2 years ago". Results were: normal per her report  Anal Intercourse: history of  Last Mammogram: never  Objective:  Physical Exam  Constitutional: Well developed, well nourished, no acute distress. She is alert and oriented x3.  Pelvic: External genitalia with scaled confluent plaques that are slightly raised, flat and hyperpigmented. The vagina is normal in appearance. The cervix is bulbous and  easily visualized. No CMT, normal expected cervical mucus present.  Anus: some hypopigmented discoloration. Same scaled confluent plaques extending down around anus and gluteal cleft. Single pink nodule  Breasts: symmetrical  Psych: She has a normal mood and affect.    Assessment:  Normal pelvic exam. Thin prep pap was obtained and sent for cytology with reflex HPV and GC/C today. Normal clinical breast exam. Rashes noted as above.   Plan:  Health Maintenance =   Return in 1 year for annual pap screening unless indicated sooner. Discussed recommended screening interval for women living with HIV disease.   She has been counseled and instructed how to perform monthly self breast exams.  Screening mammogram to start at 32 yo.  Contraception / Family Planning =   Currently medically induced menopause   She desires pregnancy in the future  Vulvar Dermatitis / Rash =   Not consistent with folliculitis, candidal intertrigo or genital warts. Appears eczematous/psoriatic. Will refer to dermatology for consideration of biopsy to help guide treatment/management.   Hemorrhoid =   Appearance somewhat c/w external hemorrhoid but not very tender. Would like to refer to general surgery with her h/o AIN-III on path in 2014 and need for follow up.   Will give rx hydrocortisone supp for itching relief  Discussed sitz baths and other symptomatic measures   HIV =   She will continue her reyataz + norvir + truvada and F/U as scheduled with Dr. Linus Salmons for ongoing HIV care.  She is hesitant to switch her regimen.   [name], MSN, NP-C [facility] [place] [facility] Office: [phone] Pager: [phone]  02/14/18 8:51 AM

## 2018-02-16 LAB — CYTOLOGY - PAP
Chlamydia: NEGATIVE
DIAGNOSIS: NEGATIVE
HPV (WINDOPATH): NOT DETECTED
NEISSERIA GONORRHEA: NEGATIVE

## 2018-02-18 ENCOUNTER — Telehealth: Payer: Self-pay

## 2018-02-18 ENCOUNTER — Other Ambulatory Visit: Payer: Self-pay | Admitting: Hematology and Oncology

## 2018-02-18 DIAGNOSIS — B20 Human immunodeficiency virus [HIV] disease: Secondary | ICD-10-CM

## 2018-02-18 MED ORDER — TRAMADOL HCL 50 MG PO TABS: 100.0000 mg | ORAL_TABLET | Freq: Four times a day (QID) | ORAL | 0 refills | Status: DC | PRN

## 2018-02-18 NOTE — Telephone Encounter (Signed)
-----   Message from [place] Callas, NP sent at 02/17/2018  2:20 PM EDT ----- Please call [name] to let her know that her pap smear was normal. This means that she has no cancerous or precancerous cells and no HPV (humanpapilloma virus) infection. Would repeat this in 1 year.   Please also let her know that she should expect a call from general surgery team to look at the hemorrhoid. She had a history of an abnormal wart removed from her bottom in 2014 that I want to make sure is not contributing to this.

## 2018-02-18 NOTE — Telephone Encounter (Signed)
Per Colletta [place], called patient to let her know about normal pap results, and about her being contacted from the general surgery team to have her hemorrhoid looked at. Patient is aware and stated she will be waiting on the call next week.

## 2018-02-18 NOTE — Telephone Encounter (Signed)
She called and left a requesting Tramadol Rx sent to [facility] on [address]. She will run out Sunday.

## 2018-02-20 ENCOUNTER — Other Ambulatory Visit: Payer: Self-pay | Admitting: Internal Medicine

## 2018-02-20 DIAGNOSIS — B20 Human immunodeficiency virus [HIV] disease: Secondary | ICD-10-CM

## 2018-02-21 ENCOUNTER — Telehealth: Payer: Self-pay | Admitting: Oncology

## 2018-02-21 ENCOUNTER — Other Ambulatory Visit: Payer: Self-pay

## 2018-02-21 DIAGNOSIS — B20 Human immunodeficiency virus [HIV] disease: Secondary | ICD-10-CM

## 2018-02-21 MED ORDER — RITONAVIR 100 MG PO TABS: ORAL_TABLET | ORAL | 5 refills | Status: DC

## 2018-02-21 NOTE — Telephone Encounter (Signed)
NG out of office 10/29 thru 11/29 - moved 10/31 f/u to [facility]. Spoke with patient she is aware.

## 2018-02-22 ENCOUNTER — Other Ambulatory Visit: Payer: Self-pay | Admitting: Internal Medicine

## 2018-02-22 ENCOUNTER — Other Ambulatory Visit: Payer: Self-pay | Admitting: *Deleted

## 2018-02-22 DIAGNOSIS — B20 Human immunodeficiency virus [HIV] disease: Secondary | ICD-10-CM

## 2018-02-22 DIAGNOSIS — R636 Underweight: Secondary | ICD-10-CM

## 2018-02-22 MED ORDER — EMTRICITABINE-TENOFOVIR DF 200-300 MG PO TABS: 1.0000 | ORAL_TABLET | Freq: Every day | ORAL | 1 refills | Status: DC

## 2018-02-22 MED ORDER — ATAZANAVIR SULFATE 300 MG PO CAPS: ORAL_CAPSULE | ORAL | 1 refills | Status: DC

## 2018-02-22 MED ORDER — RITONAVIR 100 MG PO TABS: ORAL_TABLET | ORAL | 1 refills | Status: DC

## 2018-02-22 NOTE — Telephone Encounter (Signed)
Ok to refill, yes

## 2018-02-22 NOTE — Telephone Encounter (Signed)
OK to fill Megace? Stopped 03/23/2017 By Dr Joseph Art.  Please advise. Landis Gandy, RN

## 2018-02-24 ENCOUNTER — Inpatient Hospital Stay: Payer: 59

## 2018-02-24 ENCOUNTER — Inpatient Hospital Stay (HOSPITAL_BASED_OUTPATIENT_CLINIC_OR_DEPARTMENT_OTHER): Payer: 59 | Admitting: Oncology

## 2018-02-24 ENCOUNTER — Telehealth: Payer: Self-pay | Admitting: *Deleted

## 2018-02-24 ENCOUNTER — Encounter: Payer: Self-pay | Admitting: Oncology

## 2018-02-24 ENCOUNTER — Telehealth: Payer: Self-pay | Admitting: Oncology

## 2018-02-24 VITALS — BP 119/82 | HR 85 | Temp 98.6°F | Resp 18 | Ht 64.0 in | Wt 112.7 lb

## 2018-02-24 DIAGNOSIS — M1990 Unspecified osteoarthritis, unspecified site: Secondary | ICD-10-CM

## 2018-02-24 DIAGNOSIS — E876 Hypokalemia: Secondary | ICD-10-CM

## 2018-02-24 DIAGNOSIS — Z5112 Encounter for antineoplastic immunotherapy: Secondary | ICD-10-CM | POA: Diagnosis not present

## 2018-02-24 DIAGNOSIS — Z95828 Presence of other vascular implants and grafts: Secondary | ICD-10-CM

## 2018-02-24 DIAGNOSIS — C8118 Nodular sclerosis classical Hodgkin lymphoma, lymph nodes of multiple sites: Secondary | ICD-10-CM

## 2018-02-24 DIAGNOSIS — Z5111 Encounter for antineoplastic chemotherapy: Secondary | ICD-10-CM

## 2018-02-24 DIAGNOSIS — C8198 Hodgkin lymphoma, unspecified, lymph nodes of multiple sites: Secondary | ICD-10-CM

## 2018-02-24 DIAGNOSIS — R630 Anorexia: Secondary | ICD-10-CM | POA: Diagnosis not present

## 2018-02-24 LAB — COMPREHENSIVE METABOLIC PANEL
ALBUMIN: 4 g/dL (ref 3.5–5.0)
ALK PHOS: 102 U/L (ref 38–126)
ALT: 16 U/L (ref 0–44)
AST: 21 U/L (ref 15–41)
Anion gap: 11 (ref 5–15)
BILIRUBIN TOTAL: 1.4 mg/dL — AB (ref 0.3–1.2)
BUN: 8 mg/dL (ref 6–20)
CO2: 26 mmol/L (ref 22–32)
Calcium: 9.9 mg/dL (ref 8.9–10.3)
Chloride: 106 mmol/L (ref 98–111)
Creatinine, Ser: 1.03 mg/dL — ABNORMAL HIGH (ref 0.44–1.00)
GFR calc Af Amer: >60 mL/min (ref >60)
GFR calc non Af Amer: >60 mL/min (ref >60)
GLUCOSE: 90 mg/dL (ref 70–99)
POTASSIUM: 3.7 mmol/L (ref 3.5–5.1)
Sodium: 143 mmol/L (ref 135–145)
TOTAL PROTEIN: 7.2 g/dL (ref 6.5–8.1)

## 2018-02-24 LAB — CBC WITH DIFFERENTIAL/PLATELET
ABS IMMATURE GRANULOCYTES: 0.02 10*3/uL (ref 0.00–0.07)
Basophils Absolute: 0 10*3/uL (ref 0.0–0.1)
Basophils Relative: 0 %
Eosinophils Absolute: 0.1 10*3/uL (ref 0.0–0.5)
Eosinophils Relative: 2 %
HEMATOCRIT: 39.3 % (ref 36.0–46.0)
HEMOGLOBIN: 13.7 g/dL (ref 12.0–15.0)
IMMATURE GRANULOCYTES: 0 %
LYMPHS ABS: 2.2 10*3/uL (ref 0.7–4.0)
LYMPHS PCT: 30 %
MCH: 34.2 pg — ABNORMAL HIGH (ref 26.0–34.0)
MCHC: 34.9 g/dL (ref 30.0–36.0)
MCV: 98 fL (ref 80.0–100.0)
Monocytes Absolute: 0.6 10*3/uL (ref 0.1–1.0)
Monocytes Relative: 8 %
NEUTROS PCT: 60 %
NRBC: 0 % (ref 0.0–0.2)
Neutro Abs: 4.5 10*3/uL (ref 1.7–7.7)
Platelets: 290 10*3/uL (ref 150–400)
RBC: 4.01 MIL/uL (ref 3.87–5.11)
RDW: 12.5 % (ref 11.5–15.5)
WBC: 7.5 10*3/uL (ref 4.0–10.5)

## 2018-02-24 LAB — TSH: TSH: 1.471 u[IU]/mL (ref 0.308–3.960)

## 2018-02-24 MED ORDER — SODIUM CHLORIDE 0.9% FLUSH
10.0000 mL | INTRAVENOUS | Status: DC | PRN
  Administered 2018-02-24: 10 mL
  Filled 2018-02-24: qty 10

## 2018-02-24 MED ORDER — LEUPROLIDE ACETATE (3 MONTH) 22.5 MG IM KIT
PACK | INTRAMUSCULAR | Status: AC
  Filled 2018-02-24: qty 22.5

## 2018-02-24 MED ORDER — LEUPROLIDE ACETATE 7.5 MG IM KIT
7.5000 mg | PACK | Freq: Once | INTRAMUSCULAR | Status: AC
  Administered 2018-02-24: 7.5 mg via INTRAMUSCULAR
  Filled 2018-02-24: qty 7.5

## 2018-02-24 MED ORDER — HEPARIN SOD (PORK) LOCK FLUSH 100 UNIT/ML IV SOLN
500.0000 [IU] | Freq: Once | INTRAVENOUS | Status: AC | PRN
  Administered 2018-02-24: 500 [IU]
  Filled 2018-02-24: qty 5

## 2018-02-24 MED ORDER — SODIUM CHLORIDE 0.9 % IV SOLN
Freq: Once | INTRAVENOUS | Status: AC
  Administered 2018-02-24: 11:00:00 via INTRAVENOUS
  Filled 2018-02-24: qty 250

## 2018-02-24 MED ORDER — SODIUM CHLORIDE 0.9 % IV SOLN
200.0000 mg | Freq: Once | INTRAVENOUS | Status: AC
  Administered 2018-02-24: 200 mg via INTRAVENOUS
  Filled 2018-02-24: qty 8

## 2018-02-24 NOTE — Telephone Encounter (Signed)
Yes, ok to refill 

## 2018-02-24 NOTE — Telephone Encounter (Signed)
Patient asking for refill of megace. This was removed from her medication list. Please advise if ok to refill. Landis Gandy, RN

## 2018-02-24 NOTE — Patient Instructions (Signed)
[facility] Discharge Instructions for Patients Receiving Chemotherapy  Today you received the following chemotherapy agents :  Keytruda.  To help prevent nausea and vomiting after your treatment, we encourage you to take your nausea medication as prescribed.   If you develop nausea and vomiting that is not controlled by your nausea medication, call the clinic.   BELOW ARE SYMPTOMS THAT SHOULD BE REPORTED IMMEDIATELY:  *FEVER GREATER THAN 100.5 F  *CHILLS WITH OR WITHOUT FEVER  NAUSEA AND VOMITING THAT IS NOT CONTROLLED WITH YOUR NAUSEA MEDICATION  *UNUSUAL SHORTNESS OF BREATH  *UNUSUAL BRUISING OR BLEEDING  TENDERNESS IN MOUTH AND THROAT WITH OR WITHOUT PRESENCE OF ULCERS  *URINARY PROBLEMS  *BOWEL PROBLEMS  UNUSUAL RASH Items with * indicate a potential emergency and should be followed up as soon as possible.  Feel free to call the clinic should you have any questions or concerns. The clinic phone number is [phone].  Please show the CHEMO ALERT CARD at check-in to the Emergency Department and triage nurse.  

## 2018-02-24 NOTE — Progress Notes (Signed)
[facility] OFFICE PROGRESS NOTE  Comer, Okey Regal, MD 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    05/07/2014 Imaging    CT scan of the chest show right thoracic inlet lymphadenopathy    06/07/2014 Procedure    She underwent ultrasound-guided core biopsy of the neck lymph node    06/07/2014 Pathology Results    Accession: [id number] biopsy confirmed diagnosis of Hodgkin lymphoma.    06/15/2014 Imaging    Echocardiogram showed preserved ejection fraction    07/09/2014 - 07/12/2014 Hospital Admission    She was admitted to the hospital for severe anemia.    07/27/2014 Procedure    She had placement of port    07/31/2014 - 09/11/2014 Chemotherapy    She received dose adjusted chemotherapy due to abnormal liver function tests and severe anemia. Treatment was delayed due to noncompliance  and subsequently stopped because the patient failed to keep appointments    01/11/2015 Imaging    Repeat PET CT scan showed response to treatment    01/28/2015 - 06/18/2015 Chemotherapy    ABVD was restarted with full dose.    02/08/2015 - 02/10/2015 Hospital Admission    The patient was admitted to the hospital due to pancytopenia and profuse diarrhea. Cultures were negative. She was placed on ciprofloxacin.    02/11/2015 Adverse Reaction    Treatment was placed on hold due to recent infection.    04/11/2015 Imaging    PET CT scan showed near complete response. Incidental finding of an abnormal bone lesion, indeterminate. She is not symptomatic. Recommendation from [facility] to observe    07/11/2015 Imaging    PET CT scan showed abnormal new bone lesions, suggestive of possible disease progression    07/23/2015 Bone Marrow Biopsy    She underwent bone biopsy    07/23/2015 Pathology Results    Accession: [id number]  biopsy was negative for cancer     11/21/2015 Surgery    She had surgery for ectopic pregnancy    01/29/2016 Imaging    Ct chest, abdomen and pelvis showed pelvic and retroperitoneal lymphadenopathy, as above, concerning for residual disease. There is also a mildly enlarged posterior mediastinal lymph node measuring 11 mm adjacent to the distal descending thoracic aorta. This may represent an additional focus of disease, but is the only finding of concern in the thorax on today's examination. Sclerosis in the right ilium at site of previously noted metabolically active lesion, grossly unchanged. No other definite osseous lesions are identified on today's examination. Spleen is normal in size and appearance.    02/14/2016 PET scan    Interval disease worsening with new foci of hypermetabolic activity in multiple retroperitoneal and pelvic lymph nodes as well as AP window and left hilar lymph nodes. (Deauville 5). There is also overall worsening of the osseous disease.    05/11/2016 Pathology Results    Diagnosis Lymph node, needle/core biopsy, Left para-aortic retroperitoneal - CLASSICAL HODGKIN LYMPHOMA. - SEE ONCOLOGY TABLE. Microscopic Comment LYMPHOMA Histologic type: Classical Hodgkin lymphoma. Grade (if applicable): N/A Flow cytometry: Not done. Immunohistochemical stains: CD15, CD20, CD3, LCA, PAX-5, CD30 with appropriate controls. Touch preps/imprints: Not performed. Comments: The sections show small needle core biopsy fragments displaying a polymorphous cellular proliferation of small lymphocytes, plasma cells, eosinophils, and large atypical mononuclear and multilobated lymphoid cells with features of Reed-Sternberg cells and variants.  This is associated with patchy areas of fibrosis. Immunohistochemical stains were performed and show that the large atypical lymphoid cells are positive for CD30, CD15 and PAX-5 and negative for LCA, CD20, CD3. The small lymphoid cells in the background show a mixture of T and B cells  with predominance of T cells. The overall morphologic and histologic features are consistent with classical Hodgkin lymphoma. Further subtyping is challenging in limited small biopsy fragments but the patchy fibrosis suggests nodular sclerosis type.     05/11/2016 Procedure    She underwent CT guided biopsy of retroperitoneal lymph node    05/26/2016 Procedure    Successful placement of a right IJ approach Power Port with ultrasound and fluoroscopic guidance. The catheter is ready for use.    06/02/2016 PET scan    Mixed response to chemotherapy with some lymph nodes decreased in metabolic activity and some lymph nodes increase metabolic activity. Lymph node stations including mediastinum, periaortic retroperitoneum, and obturator node stations. Activity is remains relatively intense Deauville 4 2. LEFT infrahilar nodule / lymph node with intense metabolic activity decreased from prior. ( Deauville 4 ). 3. 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 Deauville 5    06/03/2016 - 06/05/2016 Hospital Admission    She was admitted to the hospital for cycle 1 of ICE chemotherapy    06/24/2016 - 06/26/2016 Hospital Admission    She received cycle 2 of ICE chemo    07/07/2016 PET scan    Resolution of prior hypermetabolic adenopathy and resolution of prior osseous foci of hypermetabolic activity compatible with essentially complete response to therapy (Deauville 1). 2. Generalized reduced activity in the L4 vertebral body and the type of finding which would typically reflect prior radiation therapy. 3. Stable septated fatty right pelvic lesion, possibly a dermoid or lipoma, not hypermetabolic    1/61/0960 PET scan    Hypermetabolic lesion along the L3 vertebral body and left posterior elements, max SUV 7.8 (Deauville 5). Hypermetabolic lesion along the left inferior pubic ramus, max SUV 4.9 (Deauville 4).  IMPRESSION: Prevascular lymphadenopathy, reflecting nodal recurrence  (Deauville 4). Hypermetabolic osseous metastases involving the L3 vertebral body/posterior elements and left inferior pubic ramus (Deauville 4-5). Hypermetabolism along the endometrium, new, possibly reactive/physiologic. Consider pelvic ultrasound and/or endometrial sampling as clinically warranted.    11/04/2016 - 02/24/2017 Chemotherapy    She received Brentuximab    12/11/2016 PET scan    1. Mixed response to therapy within the skeleton. Lesions at L3 is decreased in size and metabolic activity. Residual activity remains above liver ( Deauville 4) 2. Increased activity in the LEFT sacrum with metabolic activity above liver activity ( Deauville 4 3. Decrease in size and metabolic activity of anterior mediastinal tissue consistent with resolution of thymic hyperplasia or resolution of lymphoma metabolic activity ( Deauville 2). 4. No new lymphadenopathy. Normal spleen and liver.    03/25/2017 PET scan    1. Increased metabolic activity in several normal sized retroperitoneal and right pelvic lymph nodes, Deauville 4. 2. Increase in size and metabolic activity of lesions in the L3 and L4 vertebral bodies and in the left sacrum, Deauville 5. 3. No recurrence of anterior mediastinal abnormal activity. 4. Other imaging findings of potential clinical significance: Chronic ethmoid and left maxillary sinusitis. Paraseptal emphysema at the lung apices. Right ovarian dermoid.    04/06/2017 -  Chemotherapy    The patient had Keytruda    06/09/2017 PET scan    1. Overall significant improvement with reduction in  nodal activity. Several retroperitoneal nodes which were previously Deauville 4 are currently Deauville 3. The right common iliac lymph node remains at Deauville 4 with maximum SUV of 3.4, but is improved from prior SUV of 4.3. 2. The bony lesions show the greatest improvement, with the previously Deauville 5 hypermetabolic lesions currently no longer of higher metabolic activity than surrounding  bone marrow, currently measuring at Deauville 3. 3. 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 4. No new lesions are identified. 5. Emphysema (ICD10-J43.9).    08/05/2017 Imaging    1. Somewhat complex left adnexal lesion, potentially hemorrhagic cyst measuring 2.9 x 2.8 cm. The possibility of torsion in the left ovary must be considered. Advise correlation with pelvic ultrasound including Doppler assessment to further evaluate.  2.  Right ovarian dermoid, unchanged from recent PET-CT examination.  3. No adenopathy by size criteria evident. Appearance of subcentimeter mesenteric lymph nodes is stable compared to recent study.  4. No bone lesions appreciable by CT. Areas of abnormal radiotracer uptake in the mid lumbar spine noted on recent PET-CT examination. No destruction 2 or lytic change noted in the lumbar vertebrae currently there.  5. No bowel obstruction. No abscess. No periappendiceal region inflammation.  6.  Small hiatal hernia with fluid in distal esophagus.  7.  Foci of coronary artery calcification, advanced for age.  8.  Spleen normal in size and contour.  9. Small hiatal hernia with mild fluid in the distal esophagus, likely indicative of a degree of reflux.    01/04/2018 Imaging    1. No findings to suggest residual/recurrent lymphoma in the chest, abdomen or pelvis. 2. Right ovarian dermoid slightly larger than prior examinations, currently measuring 3.8 x 2.8 x 3.1 cm. 3. Additional incidental findings, as above.    CURRENT THERAPY: Maintenance Keytruda 200 mg IV every 4 weeks.  Status post 13 cycles.  INTERVAL HISTORY: [name] 33 y.o. female returns for routine follow-up visit by herself.  The patient is feeling fine and has no specific complaints.  She continues to tolerate treatment with [facility] fairly well.  She denies fevers and chills.  Denies chest pain, shortness of breath, cough, hemoptysis.  Denies  nausea, vomiting, constipation, diarrhea.  She reports that her appetite is improving.  She is currently on Megace and tolerating this well.  Denies weight loss or night sweats.  No new adenopathy.  Her pain is well controlled with tramadol.  She has some concern regarding her current living situation.  She is currently living in public housing and feels unsafe.  She is asking for assistance with helping her move to a safer area.  Patient is here for evaluation prior to cycle number 14 of Keytruda.  MEDICAL HISTORY: Past Medical History:  Diagnosis Date  . AIN III (anal intraepithelial neoplasia III)   . Anemia   . Cancer ([place])    Hodgkin lymphoma  . Chest wall pain 06/27/2015  . Condyloma acuminatum in female   . Depression   . History of chronic bronchitis   . History of esophagitis    CANDIDA  . History of shingles   . HIV (human immunodeficiency virus infection) ([place])   . Hodgkin's lymphoma ([place]) 06/12/2014  . HSV (herpes simplex virus) infection   . Hypokalemia 07/17/2014  . Periodontitis, chronic   . Screening examination for venereal disease 10/30/2013    ALLERGIES:  is allergic to temazepam.  MEDICATIONS:  Current Outpatient Medications  Medication Sig Dispense Refill  . atazanavir (  REYATAZ) 300 MG capsule TAKE ONE CAPSULE BY MOUTH DAILY WITH BREAKFAST. TAKE WITH NORVIR 90 capsule 1  . cephALEXin (KEFLEX) 500 MG capsule Take 1 capsule (500 mg total) by mouth 3 (three) times daily. 21 capsule 0  . cholecalciferol (VITAMIN D) 1000 units tablet Take 1 tablet (1,000 Units total) by mouth daily. 30 tablet 9  . emtricitabine-tenofovir (TRUVADA) 200-300 MG tablet Take 1 tablet by mouth daily. 90 tablet 1  . hydrocortisone (ANUSOL-HC) 2.5 % rectal cream Place 1 application rectally 2 (two) times daily. 30 g 1  . mirtazapine (REMERON) 15 MG tablet Take 1 tablet (15 mg total) by mouth at bedtime. 30 tablet 3  . ondansetron (ZOFRAN ODT) 8 MG disintegrating tablet Take 1 tablet (8 mg total)  by mouth every 8 (eight) hours as needed for nausea. 20 tablet 0  . Polyvinyl Alcohol-Povidone (CLEAR EYES ALL SEASONS OP) Place 1 drop into both eyes daily.    . ritonavir (NORVIR) 100 MG TABS tablet TAKE 1 TABLET BY MOUTH EVERY MORNING WITH BREAKFAST. TAKE WITH REYATAZ 90 tablet 1  . traMADol (ULTRAM) 50 MG tablet Take 2 tablets (100 mg total) by mouth every 6 (six) hours as needed for moderate pain. 90 tablet 0  . tretinoin (RETIN-A) 0.025 % cream Wash face and wait at least 20 minutes. Apply a pea size amount to the face except upper eyelids. Skip a few nights if you get too dry.    . valACYclovir (VALTREX) 1000 MG tablet Take 1 tablet (1,000 mg total) by mouth 2 (two) times daily. 14 tablet 0   No current facility-administered medications for this visit.     SURGICAL HISTORY:  Past Surgical History:  Procedure Laterality Date  . DIAGNOSTIC LAPAROSCOPY WITH REMOVAL OF ECTOPIC PREGNANCY N/A 11/17/2015   Procedure: LAPAROSCOPY LEFT  SALPINGECTOMY SECONDARY TO LEFT ECTOPIC PREGNANCY;  Surgeon: Jonnie Kind, MD;  Location: [facility];  Service: Gynecology;  Laterality: N/A;  . DILATION AND CURETTAGE OF UTERUS  2005   MISSED AB  . EXAMINATION UNDER ANESTHESIA N/A 09/23/2012   Procedure: EXAM UNDER ANESTHESIA;  Surgeon: Adin Hector, MD;  Location: [facility];  Service: General;  Laterality: N/A;  . IR GENERIC HISTORICAL  05/26/2016   IR FLUORO GUIDE PORT INSERTION RIGHT 05/26/2016 [facility]  . IR GENERIC HISTORICAL  05/26/2016   IR US GUIDE VASC ACCESS RIGHT 05/26/2016 [facility]  . LASER ABLATION CONDOLAMATA N/A 09/23/2012   Procedure: REMOVAL/ABLATION  ABLATION CONDOLAMATA WARTS;  Surgeon: Adin Hector, MD;  Location: [facility];  Service: General;  Laterality: N/A;    REVIEW OF SYSTEMS:   Review of Systems  Constitutional: Negative for appetite change, chills, fatigue, fever and unexpected weight change.  HENT:   Negative for mouth sores,  nosebleeds, sore throat and trouble swallowing.   Eyes: Negative for eye problems and icterus.  Respiratory: Negative for cough, hemoptysis, shortness of breath and wheezing.   Cardiovascular: Negative for chest pain and leg swelling.  Gastrointestinal: Negative for abdominal pain, constipation, diarrhea, nausea and vomiting.  Genitourinary: Negative for bladder incontinence, difficulty urinating, dysuria, frequency and hematuria.   Musculoskeletal: Negative for back pain, gait problem, neck pain and neck stiffness.  Skin: Negative for itching and rash.  Neurological: Negative for dizziness, extremity weakness, gait problem, headaches, light-headedness and seizures.  Hematological: Negative for adenopathy. Does not bruise/bleed easily.  Psychiatric/Behavioral: Negative for confusion, depression and sleep disturbance. The patient is not nervous/anxious.     PHYSICAL  EXAMINATION:  Blood pressure 119/82, pulse 85, temperature 98.6 F (37 C), temperature source Oral, resp. rate 18, height '5\' 4"'  (1.626 m), weight 112 lb 11.2 oz (51.1 kg), SpO2 100 %, unknown if currently breastfeeding.  ECOG PERFORMANCE STATUS: 1 - Symptomatic but completely ambulatory  Physical Exam  Constitutional: Oriented to person, place, and . No distress.  HENT:  Head: Normocephalic and atraumatic.  Mouth/Throat: Oropharynx is clear and moist. No oropharyngeal exudate.  Eyes: Conjunctivae are normal. Right eye exhibits no discharge. Left eye exhibits no discharge. No scleral icterus.  Neck: Normal range of motion. Neck supple.  Cardiovascular: Normal rate, regular rhythm, normal heart sounds and intact distal pulses.   Pulmonary/Chest: Effort normal and breath sounds normal. No respiratory distress. No wheezes. No rales.  Abdominal: Soft. Bowel sounds are normal. Exhibits no distension and no mass. There is no tenderness.  Musculoskeletal: Normal range of motion. Exhibits no edema.  Lymphadenopathy:    No cervical  adenopathy.  Neurological: Alert and oriented to person, place, and time. Exhibits normal muscle tone. Gait normal. Coordination normal.  Skin: Skin is warm and dry. No rash noted. Not diaphoretic. No erythema. No pallor.  Psychiatric: Mood, memory and judgment normal.  Vitals reviewed.  LABORATORY DATA: Lab Results  Component Value Date   WBC 7.5 02/24/2018   HGB 13.7 02/24/2018   HCT 39.3 02/24/2018   MCV 98.0 02/24/2018   PLT 290 02/24/2018      Chemistry      Component Value Date/Time   NA 143 02/24/2018 0847   NA 140 04/29/2017 0745   K 3.7 02/24/2018 0847   K 3.3 (L) 04/29/2017 0745   CL 106 02/24/2018 0847   CO2 26 02/24/2018 0847   CO2 23 04/29/2017 0745   BUN 8 02/24/2018 0847   BUN 10.7 04/29/2017 0745   CREATININE 1.03 (H) 02/24/2018 0847   CREATININE 1.2 (H) 04/29/2017 0745      Component Value Date/Time   CALCIUM 9.9 02/24/2018 0847   CALCIUM 9.8 04/29/2017 0745   ALKPHOS 102 02/24/2018 0847   ALKPHOS 81 04/29/2017 0745   AST 21 02/24/2018 0847   AST 19 04/29/2017 0745   ALT 16 02/24/2018 0847   ALT 19 04/29/2017 0745   BILITOT 1.4 (H) 02/24/2018 0847   BILITOT 1.31 (H) 04/29/2017 0745       RADIOGRAPHIC STUDIES:  Vas [place] Upper Extremity Venous Duplex  Result Date: 02/11/2018 UPPER VENOUS STUDY  Indications: Swelling Performing Technologist: Maudry Mayhew MHA, RDMS, RVT, RDCS  Examination Guidelines: A complete evaluation includes B-mode imaging, spectral Doppler, color Doppler, and power Doppler as needed of all accessible portions of each vessel. Bilateral testing is considered an integral part of a complete examination. Limited examinations for reoccurring indications may be performed as noted.  Right Findings: +----------+------------+----------+---------+-----------+-------+ RIGHT     CompressiblePropertiesPhasicitySpontaneousSummary +----------+------------+----------+---------+-----------+-------+ Subclavian                          Yes       Yes            +----------+------------+----------+---------+-----------+-------+  Left Findings: +----------+------------+----------+---------+-----------+-------+ LEFT      CompressiblePropertiesPhasicitySpontaneousSummary +----------+------------+----------+---------+-----------+-------+ IJV           Full                 Yes       Yes            +----------+------------+----------+---------+-----------+-------+ Subclavian    Full  Yes       Yes            +----------+------------+----------+---------+-----------+-------+ Axillary      Full                 Yes       Yes            +----------+------------+----------+---------+-----------+-------+ Brachial      Full                 Yes       Yes            +----------+------------+----------+---------+-----------+-------+ Radial        Full                                          +----------+------------+----------+---------+-----------+-------+ Ulnar         Full                                          +----------+------------+----------+---------+-----------+-------+ Cephalic      Full                                          +----------+------------+----------+---------+-----------+-------+ Basilic       Full                                          +----------+------------+----------+---------+-----------+-------+  Summary:  Right: No evidence of thrombosis in the subclavian vein.  Left: No evidence of deep vein thrombosis in the upper extremity. No evidence of superficial vein thrombosis in the upper extremity.  *See table(s) above for measurements and observations.  Diagnosing physician: Monica Martinez MD Electronically signed by Monica Martinez MD on 02/11/2018 at 11:59:38 AM.    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  She is  currently on maintenance Keytruda every 4 weeks.  She is tolerating her treatment well with no concerning complaints.  Recommend for her to proceed with cycle #14 today as scheduled.  She will follow-up in 4 weeks for evaluation prior to cycle #15.  Inflammatory arthritis The patient has received prednisone therapy in the past.  She is on tramadol on as-needed basis.  Pain is well controlled.  She was reminded not to exceed the total prescribed dose.  Hypokalemia She is currently on a potassium rich diet.  Potassium level is normal today at 3.7.  Continue potassium rich diet.  Anorexia The patient is currently on Megace which she is tolerating well.  Appetite has improved.  Recommend she continue Megace.  I have referred this patient to social work for discussion of her living situation and for assistance in helping her get to a safer place.  The patient was encouraged to call us for any questions or problems prior to her next visit.  Orders Placed This Encounter  Procedures  . Ambulatory referral to Social Work    Referral Priority:   Routine    Referral Type:   Consultation  Referral Reason:   Specialty Services Required    Number of Visits Requested:   Grant, [place], AGPCNP-BC, AOCNP 02/24/18

## 2018-02-24 NOTE — Assessment & Plan Note (Signed)
She is currently on a potassium rich diet.  Potassium level is normal today at 3.7.  Continue potassium rich diet.

## 2018-02-24 NOTE — Assessment & Plan Note (Signed)
This is a pleasant 32 year old African-American female with nodular sclerosis Hodgkin's lymphoma.  She is currently on maintenance Keytruda every 4 weeks.  She is tolerating her treatment well with no concerning complaints.  Recommend for her to proceed with cycle #14 today as scheduled.  She will follow-up in 4 weeks for evaluation prior to cycle #15.

## 2018-02-24 NOTE — Assessment & Plan Note (Signed)
The patient is currently on Megace which she is tolerating well.  Appetite has improved.  Recommend she continue Megace.

## 2018-02-24 NOTE — Assessment & Plan Note (Signed)
The patient has received prednisone therapy in the past.  She is on tramadol on as-needed basis.  Pain is well controlled.  She was reminded not to exceed the total prescribed dose. 

## 2018-02-24 NOTE — Telephone Encounter (Signed)
Pt requested appts to be scheduled before the holidays. Gave pt avs and calendar

## 2018-02-25 ENCOUNTER — Encounter: Payer: Self-pay | Admitting: *Deleted

## 2018-02-25 NOTE — Progress Notes (Signed)
[place] Work  Clinical Social Work was referred by NP for assessment of psychosocial needs.  Clinical Social Worker contacted patient by phone  to offer support and assess for needs.  CSW unable to reach patient, left VM to return call.   Gwinda [place], LCSW  Clinical Social Worker [facility]

## 2018-03-02 ENCOUNTER — Telehealth: Payer: Self-pay

## 2018-03-02 ENCOUNTER — Other Ambulatory Visit: Payer: Self-pay

## 2018-03-02 DIAGNOSIS — B20 Human immunodeficiency virus [HIV] disease: Secondary | ICD-10-CM

## 2018-03-02 MED ORDER — TRAMADOL HCL 50 MG PO TABS: 100.0000 mg | ORAL_TABLET | Freq: Four times a day (QID) | ORAL | 0 refills | Status: DC | PRN

## 2018-03-02 MED FILL — traMADol HCL 50 MG TABS: 50 | 12 days supply | Qty: 90 | Fill #0

## 2018-03-02 NOTE — Telephone Encounter (Signed)
Called back and told her Rx ready for pick up.

## 2018-03-02 NOTE — Telephone Encounter (Signed)
She called requesting refill on Tramadol Rx.

## 2018-03-04 ENCOUNTER — Encounter: Payer: Self-pay | Admitting: *Deleted

## 2018-03-04 NOTE — Progress Notes (Signed)
[place] Work  Clinical Social Work was referred by NP for assessment of housing needs.  Clinical Social Worker contacted patient by phone  to offer support and assess for needs.  Patient shared she feels unsafe at current apartment.  She is already in process of applying for [employer] 8 housing.  CSW encouraged her to follow through with this application and made referral to [employer] for other housing options.      Gwinda [place], LCSW  Clinical Social Worker [facility]

## 2018-03-10 ENCOUNTER — Telehealth: Payer: Self-pay | Admitting: *Deleted

## 2018-03-10 ENCOUNTER — Other Ambulatory Visit: Payer: Self-pay | Admitting: Nurse Practitioner

## 2018-03-10 DIAGNOSIS — B20 Human immunodeficiency virus [HIV] disease: Secondary | ICD-10-CM

## 2018-03-10 MED ORDER — TRAMADOL HCL 50 MG PO TABS: 100.0000 mg | ORAL_TABLET | Freq: Four times a day (QID) | ORAL | 0 refills | Status: DC | PRN

## 2018-03-10 NOTE — Telephone Encounter (Signed)
Patient called for refill of Tramadol. #90 tablets for 10 day refill. Message forwarded to Ned Card, NP for review/approval.   Patient called and advised it would not be ready until tomorrow for pick up.

## 2018-03-17 ENCOUNTER — Ambulatory Visit (HOSPITAL_COMMUNITY): Admission: EM | Admit: 2018-03-17 | Discharge: 2018-03-17 | Discharge status: Against Medical Advice | Payer: 59

## 2018-03-17 ENCOUNTER — Ambulatory Visit (HOSPITAL_COMMUNITY)
Admission: EM | Admit: 2018-03-17 | Discharge: 2018-03-17 | Disposition: A | Discharge status: Home / Self Care | Payer: 59 | Attending: Family Medicine | Admitting: Family Medicine

## 2018-03-17 ENCOUNTER — Encounter (HOSPITAL_COMMUNITY): Payer: Self-pay | Admitting: Emergency Medicine

## 2018-03-17 DIAGNOSIS — F17210 Nicotine dependence, cigarettes, uncomplicated: Secondary | ICD-10-CM | POA: Insufficient documentation

## 2018-03-17 DIAGNOSIS — R1030 Lower abdominal pain, unspecified: Secondary | ICD-10-CM | POA: Insufficient documentation

## 2018-03-17 DIAGNOSIS — N939 Abnormal uterine and vaginal bleeding, unspecified: Secondary | ICD-10-CM | POA: Diagnosis present

## 2018-03-17 LAB — POCT URINALYSIS DIP (DEVICE)
BILIRUBIN URINE: NEGATIVE
GLUCOSE, UA: NEGATIVE mg/dL
KETONES UR: NEGATIVE mg/dL
Leukocytes, UA: NEGATIVE
Nitrite: NEGATIVE
PH: 6 (ref 5.0–8.0)
PROTEIN: 30 mg/dL — AB
SPECIFIC GRAVITY, URINE: 1.02 (ref 1.005–1.030)
Urobilinogen, UA: 0.2 mg/dL (ref 0.0–1.0)

## 2018-03-17 LAB — POCT PREGNANCY, URINE: Preg Test, Ur: NEGATIVE

## 2018-03-17 NOTE — ED Triage Notes (Signed)
Pt states on Tuesday she had sexual intercourse with her boyfriend and this morjning when she woke up she started having vaginal bleeding with lower pelvic pain. Pt states she also has a rash on her face and arm as well. Pt states she gets a birth control shot so she doesn't have her periods.

## 2018-03-17 NOTE — ED Notes (Signed)
Pt lwbs, stated she had to be at another appt, will call to schedule another appt

## 2018-03-17 NOTE — Discharge Instructions (Addendum)
I believe that this bleeding is most likely from a menstrual cycle. We will send a swab for further testing and call with any positive results Your urine was negative for infection

## 2018-03-18 LAB — CERVICOVAGINAL ANCILLARY ONLY
Bacterial vaginitis: POSITIVE — AB
CHLAMYDIA, DNA PROBE: NEGATIVE
Candida vaginitis: NEGATIVE
Neisseria Gonorrhea: NEGATIVE
Trichomonas: POSITIVE — AB

## 2018-03-19 ENCOUNTER — Encounter (HOSPITAL_COMMUNITY): Payer: Self-pay | Admitting: Family Medicine

## 2018-03-19 NOTE — Discharge Instructions (Addendum)
Most likely this is break through bleeding or normal period  No concerning signs on exam Follow up with your doctor for continued or worsening symptoms.
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
## 2018-03-21 ENCOUNTER — Telehealth (HOSPITAL_COMMUNITY): Payer: Self-pay

## 2018-03-21 MED ORDER — METRONIDAZOLE 500 MG PO TABS: 500.0000 mg | ORAL_TABLET | Freq: Two times a day (BID) | ORAL | 0 refills | Status: AC

## 2018-03-21 NOTE — Telephone Encounter (Signed)
Bacterial vaginosis is positive. This was not treated at the urgent care visit. Flagyl 500 mg BID x 7 days #14 no refills sent to patients pharmacy of choice. Trichomonas is positive. Rx for Flagyl 2 grams, once was sent to the pharmacy of record. Pt needs education to refrain from sexual intercourse for 7 days to give the medicine time to work. Sexual partners need to be notified and tested/treated. Condoms may reduce risk of reinfection. Recheck for further evaluation if symptoms are not improving.   Attempted to call no answer and LVMM

## 2018-03-21 NOTE — Progress Notes (Signed)
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    05/07/2014 Imaging    CT scan of the chest show right thoracic inlet lymphadenopathy    06/07/2014 Procedure    She underwent ultrasound-guided core biopsy of the neck lymph node    06/07/2014 Pathology Results    Accession: [id number] biopsy confirmed diagnosis of Hodgkin lymphoma.    06/15/2014 Imaging    Echocardiogram showed preserved ejection fraction    07/09/2014 - 07/12/2014 Hospital Admission    She was admitted to the hospital for severe anemia.    07/27/2014 Procedure    She had placement of port    07/31/2014 - 09/11/2014 Chemotherapy    She received dose adjusted chemotherapy due to abnormal liver function tests and severe anemia. Treatment was delayed due to noncompliance  and subsequently stopped because the patient failed to keep appointments    01/11/2015 Imaging    Repeat PET CT scan showed response to treatment    01/28/2015 - 06/18/2015 Chemotherapy    ABVD was restarted with full dose.    02/08/2015 - 02/10/2015 Hospital Admission    The patient was admitted to the hospital due to pancytopenia and profuse diarrhea. Cultures were negative. She was placed on ciprofloxacin.    02/11/2015 Adverse Reaction    Treatment was placed on hold due to recent infection.    04/11/2015 Imaging    PET CT scan showed near complete response.  Incidental finding of an abnormal bone lesion, indeterminate. She is not symptomatic. Recommendation from [facility] to observe    07/11/2015 Imaging    PET CT scan showed abnormal new bone lesions, suggestive of possible disease progression    07/23/2015 Bone Marrow Biopsy    She underwent bone biopsy    07/23/2015 Pathology Results    Accession: [id number]  biopsy was negative for cancer    11/21/2015 Surgery    She had surgery for ectopic pregnancy    01/29/2016 Imaging    Ct chest, abdomen and pelvis showed pelvic and retroperitoneal lymphadenopathy, as above, concerning for residual disease. There is also a mildly enlarged posterior mediastinal lymph node measuring 11 mm adjacent to the distal descending thoracic aorta. This may represent an additional focus of disease, but is the only finding of concern in the thorax on today's examination. Sclerosis in the right ilium at site of previously noted metabolically active lesion, grossly unchanged. No other definite osseous lesions are identified on today's examination. Spleen is normal in size and appearance.    02/14/2016 PET scan    Interval disease worsening with new foci of hypermetabolic activity in multiple retroperitoneal and pelvic lymph nodes as well as AP window and left hilar lymph nodes. (Deauville 5). There is also overall worsening of the osseous disease.    05/11/2016 Pathology Results    Diagnosis Lymph node, needle/core biopsy, Left para-aortic retroperitoneal - CLASSICAL HODGKIN LYMPHOMA. - SEE ONCOLOGY TABLE. Microscopic Comment LYMPHOMA Histologic type: Classical Hodgkin lymphoma. Grade (if applicable): N/A  Flow cytometry: Not done. Immunohistochemical stains: CD15, CD20, CD3, LCA, PAX-5, CD30 with appropriate controls. Touch preps/imprints: Not performed. Comments: The sections show small needle core biopsy fragments displaying a polymorphous cellular proliferation of small lymphocytes, plasma cells, eosinophils, and large  atypical mononuclear and multilobated lymphoid cells with features of Reed-Sternberg cells and variants. This is associated with patchy areas of fibrosis. Immunohistochemical stains were performed and show that the large atypical lymphoid cells are positive for CD30, CD15 and PAX-5 and negative for LCA, CD20, CD3. The small lymphoid cells in the background show a mixture of T and B cells with predominance of T cells. The overall morphologic and histologic features are consistent with classical Hodgkin lymphoma. Further subtyping is challenging in limited small biopsy fragments but the patchy fibrosis suggests nodular sclerosis type.     05/11/2016 Procedure    She underwent CT guided biopsy of retroperitoneal lymph node    05/26/2016 Procedure    Successful placement of a right IJ approach Power Port with ultrasound and fluoroscopic guidance. The catheter is ready for use.    06/02/2016 PET scan    Mixed response to chemotherapy with some lymph nodes decreased in metabolic activity and some lymph nodes increase metabolic activity. Lymph node stations including mediastinum, periaortic retroperitoneum, and obturator node stations. Activity is remains relatively intense Deauville 4 2. LEFT infrahilar nodule / lymph node with intense metabolic activity decreased from prior. ( Deauville 4 ). 3. 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 Deauville 5    06/03/2016 - 06/05/2016 Hospital Admission    She was admitted to the hospital for cycle 1 of ICE chemotherapy    06/24/2016 - 06/26/2016 Hospital Admission    She received cycle 2 of ICE chemo    07/07/2016 PET scan    Resolution of prior hypermetabolic adenopathy and resolution of prior osseous foci of hypermetabolic activity compatible with essentially complete response to therapy (Deauville 1). 2. Generalized reduced activity in the L4 vertebral body and the type of finding which would typically reflect prior radiation therapy. 3.  Stable septated fatty right pelvic lesion, possibly a dermoid or lipoma, not hypermetabolic    06/20/8248 PET scan    Hypermetabolic lesion along the L3 vertebral body and left posterior elements, max SUV 7.8 (Deauville 5). Hypermetabolic lesion along the left inferior pubic ramus, max SUV 4.9 (Deauville 4).  IMPRESSION: Prevascular lymphadenopathy, reflecting nodal recurrence (Deauville 4). Hypermetabolic osseous metastases involving the L3 vertebral body/posterior elements and left inferior pubic ramus (Deauville 4-5). Hypermetabolism along the endometrium, new, possibly reactive/physiologic. Consider pelvic ultrasound and/or endometrial sampling as clinically warranted.    11/04/2016 - 02/24/2017 Chemotherapy    She received Brentuximab    12/11/2016 PET scan    1. Mixed response to therapy within the skeleton. Lesions at L3 is decreased in size and metabolic activity. Residual activity remains above liver ( Deauville 4) 2. Increased activity in the LEFT sacrum with metabolic activity above liver activity ( Deauville 4 3. Decrease in size and metabolic activity of anterior mediastinal tissue consistent with resolution of thymic hyperplasia or resolution of lymphoma metabolic activity ( Deauville 2). 4. No new lymphadenopathy. Normal spleen and liver.    03/25/2017 PET scan    1. Increased metabolic activity in several normal sized retroperitoneal and right pelvic lymph nodes, Deauville 4. 2. Increase in size and metabolic activity of lesions in the L3 and L4 vertebral bodies and in the left sacrum, Deauville 5. 3. No recurrence of anterior mediastinal abnormal  activity. 4. Other imaging findings of potential clinical significance: Chronic ethmoid and left maxillary sinusitis. Paraseptal emphysema at the lung apices. Right ovarian dermoid.    04/06/2017 -  Chemotherapy    The patient had Keytruda    06/09/2017 PET scan    1. Overall significant improvement with reduction in nodal activity.  Several retroperitoneal nodes which were previously Deauville 4 are currently Deauville 3. The right common iliac lymph node remains at Deauville 4 with maximum SUV of 3.4, but is improved from prior SUV of 4.3. 2. The bony lesions show the greatest improvement, with the previously Deauville 5 hypermetabolic lesions currently no longer of higher metabolic activity than surrounding bone marrow, currently measuring at Deauville 3. 3. 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 4. No new lesions are identified. 5. Emphysema (ICD10-J43.9).    08/05/2017 Imaging    1. Somewhat complex left adnexal lesion, potentially hemorrhagic cyst measuring 2.9 x 2.8 cm. The possibility of torsion in the left ovary must be considered. Advise correlation with pelvic ultrasound including Doppler assessment to further evaluate.  2.  Right ovarian dermoid, unchanged from recent PET-CT examination.  3. No adenopathy by size criteria evident. Appearance of subcentimeter mesenteric lymph nodes is stable compared to recent study.  4. No bone lesions appreciable by CT. Areas of abnormal radiotracer uptake in the mid lumbar spine noted on recent PET-CT examination. No destruction 2 or lytic change noted in the lumbar vertebrae currently there.  5. No bowel obstruction. No abscess. No periappendiceal region inflammation.  6.  Small hiatal hernia with fluid in distal esophagus.  7.  Foci of coronary artery calcification, advanced for age.  8.  Spleen normal in size and contour.  9. Small hiatal hernia with mild fluid in the distal esophagus, likely indicative of a degree of reflux.    01/04/2018 Imaging    1. No findings to suggest residual/recurrent lymphoma in the chest, abdomen or pelvis. 2. Right ovarian dermoid slightly larger than prior examinations, currently measuring 3.8 x 2.8 x 3.1 cm. 3. Additional incidental findings, as above.   CURRENT THERAPY: Maintenance  Keytruda 200 mg IV every 4 weeks.  Status post 13 cycles.  INTERVAL HISTORY: Ms. Cass returns for follow up and [place] as scheduled. She had an episode of vaginal bleeding last week and went to urgent care when she saw clots. A pelvic exam was done that was unremarkable; bleeding was felt to be related to a menstrual cycle. She tested positive for bacterial vaginitis and trichomonas. She has not started metronidazole yet. She was not aware of that result. Bleeding stopped 1 day ago. She reports being in a monogamous relationship. She is on lupron injections. She denies pelvic pain or discharge. No other bleeding such as rectal bleeding.   Otherwise, she continues [place]. She completed cycle 14 on 10/31 and received lupron. She has intermittent hot flashes, last week she had a severe episode but felt she was also dehydrated at the time. Denies fall or syncope. She has occasional dyspnea on exertion. Denies cough, chest pain, no fever or chills. She had 4 episodes of diarrhea yesterday, has imodium but did not use any. Denies n/v/c. Appetite is improved on mirtazapine, she does not take megace. Pain is controlled on tramadol. Denies skin rash. Denies neuropathy. Denies lymphadenopathy.    MEDICAL HISTORY:  Past Medical History:  Diagnosis Date  . AIN III (anal intraepithelial neoplasia III)   . Anemia   . Cancer ([place])  Hodgkin lymphoma  . Chest wall pain 06/27/2015  . Condyloma acuminatum in female   . Depression   . History of chronic bronchitis   . History of esophagitis    [place]  . History of shingles   . HIV (human immunodeficiency virus infection) ([place])   . Hodgkin's lymphoma ([place]) 06/12/2014  . HSV (herpes simplex virus) infection   . Hypokalemia 07/17/2014  . Periodontitis, chronic   . Screening examination for venereal disease 10/30/2013    SURGICAL HISTORY: Past Surgical History:  Procedure Laterality Date  . DIAGNOSTIC LAPAROSCOPY WITH REMOVAL OF ECTOPIC PREGNANCY N/A  11/17/2015   Procedure: LAPAROSCOPY LEFT  SALPINGECTOMY SECONDARY TO LEFT ECTOPIC PREGNANCY;  Surgeon: Jonnie Kind, MD;  Location: [facility];  Service: Gynecology;  Laterality: N/A;  . DILATION AND CURETTAGE OF UTERUS  2005   MISSED AB  . EXAMINATION UNDER ANESTHESIA N/A 09/23/2012   Procedure: EXAM UNDER ANESTHESIA;  Surgeon: Adin Hector, MD;  Location[facility];  Service: General;  Laterality: N/A;  . IR GENERIC HISTORICAL  05/26/2016   IR FLUORO GUIDE PORT INSERTION RIGHT 05/26/2016 [facility]INTER[facility]  . IR GENERIC HISTORICAL  05/26/2016   IR US GUIDE VASC ACCESS RIGHT 05/26/2016 [facility]  . LASER ABLATION CONDOLAMATA N/A 09/23/2012   Procedure: REMOVAL/ABLATION  ABLATION CONDOLAMATA WARTS;  Surgeon: Adin Hector, MD;  Location: [facility];  Service: General;  Laterality: N/A;    I have reviewed the social history and family history with the patient and they are unchanged from previous note.  ALLERGIES:  is allergic to temazepam.  MEDICATIONS:  Current Outpatient Medications  Medication Sig Dispense Refill  . atazanavir (REYATAZ) 300 MG capsule TAKE ONE CAPSULE BY MOUTH DAILY WITH BREAKFAST. TAKE WITH NORVIR 90 capsule 1  . cholecalciferol (VITAMIN D) 1000 units tablet Take 1 tablet (1,000 Units total) by mouth daily. 30 tablet 9  . emtricitabine-tenofovir (TRUVADA) 200-300 MG tablet Take 1 tablet by mouth daily. 90 tablet 1  . hydrocortisone (ANUSOL-HC) 2.5 % rectal cream Place 1 application rectally 2 (two) times daily. 30 g 1  . mirtazapine (REMERON) 15 MG tablet Take 1 tablet (15 mg total) by mouth at bedtime. 30 tablet 3  . ondansetron (ZOFRAN ODT) 8 MG disintegrating tablet Take 1 tablet (8 mg total) by mouth every 8 (eight) hours as needed for nausea. 20 tablet 0  . Polyvinyl Alcohol-Povidone (CLEAR EYES ALL SEASONS OP) Place 1 drop into both eyes daily.    . ritonavir (NORVIR) 100 MG TABS tablet TAKE 1 TABLET BY MOUTH EVERY MORNING WITH  BREAKFAST. TAKE WITH REYATAZ 90 tablet 1  . traMADol (ULTRAM) 50 MG tablet Take 2 tablets (100 mg total) by mouth every 6 (six) hours as needed for moderate pain. 90 tablet 0  . tretinoin (RETIN-A) 0.025 % cream Wash face and wait at least 20 minutes. Apply a pea size amount to the face except upper eyelids. Skip a few nights if you get too dry.    . valACYclovir (VALTREX) 1000 MG tablet Take 1 tablet (1,000 mg total) by mouth 2 (two) times daily. 14 tablet 0  . megestrol (MEGACE ES) 625 MG/5ML suspension TAKE 5 ML BY MOUTH DAILY 150 mL 3  . metroNIDAZOLE (FLAGYL) 500 MG tablet Take 1 tablet (500 mg total) by mouth 2 (two) times daily for 7 days. 14 tablet 0   No current facility-administered medications for this visit.    Facility-Administered Medications Ordered in Other Visits  Medication  Dose Route Frequency Provider Last Rate Last Dose  . 0.9 %  sodium chloride infusion   Intravenous Once Alvy Bimler, Ni, MD      . sodium chloride flush (NS) 0.9 % injection 10 mL  10 mL Intracatheter PRN Alvy Bimler, Ni, MD   10 mL at 03/22/18 1106    PHYSICAL EXAMINATION: ECOG PERFORMANCE STATUS: 1 - Symptomatic but completely ambulatory  Vitals:   03/22/18 0858  BP: 107/68  Pulse: 74  Resp: 17  Temp: 98.9 F (37.2 C)  SpO2: 100%   Filed Weights   03/22/18 0858  Weight: 112 lb 12.8 oz (51.2 kg)    GENERAL:alert, no distress and comfortable SKIN:  no rashes or significant lesions EYES:  sclera clear OROPHARYNX:no thrush or ulcers  LYMPH:  no palpable cervical, supraclavicular, or axillary lymphadenopathy  LUNGS: clear to auscultation with normal breathing effort HEART: regular rate & rhythm, no lower extremity edema ABDOMEN:abdomen soft, non-tender and normal bowel sounds NEURO: alert & oriented x 3 with fluent speech, no focal motor/sensory deficits PAC without erythema    LABORATORY DATA:  I have reviewed the data as listed CBC Latest Ref Rng & Units 03/22/2018 02/24/2018 02/10/2018  WBC  4.0 - 10.5 K/uL 6.7 7.5 6.0  Hemoglobin 12.0 - 15.0 g/dL 12.6 13.7 13.9  Hematocrit 36.0 - 46.0 % 36.7 39.3 39.6  Platelets 150 - 400 K/uL 276 290 269     CMP Latest Ref Rng & Units 03/22/2018 02/24/2018 01/27/2018  Glucose 70 - 99 mg/dL 92 90 98  BUN 6 - 20 mg/dL _0 Creatinine 0.44 - 1.00 mg/dL 0.89 1.03(H) 0.99  Sodium 135 - 145 mmol/L 141 143 141  Potassium 3.5 - 5.1 mmol/L 3.6 3.7 3.0(LL)  Chloride 98 - 111 mmol/L 109 106 106  CO2 22 - 32 mmol/L _1 Calcium 8.9 - 10.3 mg/dL 9.4 9.9 9.4  Total Protein 6.5 - 8.1 g/dL 6.7 7.2 6.8  Total Bilirubin 0.3 - 1.2 mg/dL 1.6(H) 1.4(H) 1.0  Alkaline Phos 38 - 126 U/L 93 102 76  AST 15 - 41 U/L 21 21 13(L)  ALT 0 - 44 U/L _2 RADIOGRAPHIC STUDIES: I have personally reviewed the radiological images as listed and agreed with the findings in the report. No results found.   ASSESSMENT & PLAN: 32 yo female   1. Hodgkin lymphoma, nodular sclerosis -Ms. Tristan appears stable. She completed 14 cycles pembrolizumab, she continues to tolerate treatment well without significant toxicities.  -labs reviewed, adequate to continue -proceed with cycle 15 today -f/u in 4 weeks with Dr. Alvy Bimler and next cycle   2. Inflammatory arthritis  -pain is controlled on tramadol -refilled today   3. Hypokalemia  -resolved   4. Anorexia  -improved on mirtazapine, weight is stable. Does not take megace   5. Bacterial infections, vaginal bleeding  -she had an episode of vaginal bleeding last week, felt to be related to menstrual bleeding; on lupron monthly  -she had positive cultures at urgent care on 11/21, BV and trichomonas  -metronidazole was prescribed by outside provider, has not started yet. Educated her on dosing, avoidance of alcohol and intercourse until she completes treatment, to complete the entire course, and to notify/treat partner; she understands. She will start today.  All questions were answered. The patient knows to  call the clinic with any problems, questions or concerns. No barriers to learning was detected.     Alla Feeling, NP  03/22/18

## 2018-03-22 ENCOUNTER — Encounter: Payer: Self-pay | Admitting: Nurse Practitioner

## 2018-03-22 ENCOUNTER — Inpatient Hospital Stay: Payer: 59

## 2018-03-22 ENCOUNTER — Inpatient Hospital Stay (HOSPITAL_BASED_OUTPATIENT_CLINIC_OR_DEPARTMENT_OTHER): Payer: 59 | Admitting: Nurse Practitioner

## 2018-03-22 ENCOUNTER — Inpatient Hospital Stay: Payer: 59 | Attending: Hematology and Oncology

## 2018-03-22 VITALS — BP 107/68 | HR 74 | Temp 98.9°F | Resp 17 | Ht 64.0 in | Wt 112.8 lb

## 2018-03-22 DIAGNOSIS — A5901 Trichomonal vulvovaginitis: Secondary | ICD-10-CM | POA: Diagnosis not present

## 2018-03-22 DIAGNOSIS — B20 Human immunodeficiency virus [HIV] disease: Secondary | ICD-10-CM

## 2018-03-22 DIAGNOSIS — Z95828 Presence of other vascular implants and grafts: Secondary | ICD-10-CM

## 2018-03-22 DIAGNOSIS — Z5111 Encounter for antineoplastic chemotherapy: Secondary | ICD-10-CM

## 2018-03-22 DIAGNOSIS — C8118 Nodular sclerosis classical Hodgkin lymphoma, lymph nodes of multiple sites: Secondary | ICD-10-CM

## 2018-03-22 DIAGNOSIS — M1990 Unspecified osteoarthritis, unspecified site: Secondary | ICD-10-CM | POA: Insufficient documentation

## 2018-03-22 DIAGNOSIS — R197 Diarrhea, unspecified: Secondary | ICD-10-CM

## 2018-03-22 DIAGNOSIS — Z79899 Other long term (current) drug therapy: Secondary | ICD-10-CM | POA: Diagnosis not present

## 2018-03-22 DIAGNOSIS — Z5112 Encounter for antineoplastic immunotherapy: Secondary | ICD-10-CM | POA: Diagnosis present

## 2018-03-22 DIAGNOSIS — R0609 Other forms of dyspnea: Secondary | ICD-10-CM

## 2018-03-22 DIAGNOSIS — N760 Acute vaginitis: Secondary | ICD-10-CM | POA: Diagnosis not present

## 2018-03-22 DIAGNOSIS — R630 Anorexia: Secondary | ICD-10-CM

## 2018-03-22 DIAGNOSIS — N951 Menopausal and female climacteric states: Secondary | ICD-10-CM

## 2018-03-22 DIAGNOSIS — C8198 Hodgkin lymphoma, unspecified, lymph nodes of multiple sites: Secondary | ICD-10-CM

## 2018-03-22 LAB — COMPREHENSIVE METABOLIC PANEL
ALK PHOS: 93 U/L (ref 38–126)
ALT: 18 U/L (ref 0–44)
AST: 21 U/L (ref 15–41)
Albumin: 3.8 g/dL (ref 3.5–5.0)
Anion gap: 7 (ref 5–15)
BILIRUBIN TOTAL: 1.6 mg/dL — AB (ref 0.3–1.2)
BUN: 6 mg/dL (ref 6–20)
CALCIUM: 9.4 mg/dL (ref 8.9–10.3)
CO2: 25 mmol/L (ref 22–32)
CREATININE: 0.89 mg/dL (ref 0.44–1.00)
Chloride: 109 mmol/L (ref 98–111)
GFR calc non Af Amer: >60 mL/min (ref >60)
Glucose, Bld: 92 mg/dL (ref 70–99)
Potassium: 3.6 mmol/L (ref 3.5–5.1)
SODIUM: 141 mmol/L (ref 135–145)
TOTAL PROTEIN: 6.7 g/dL (ref 6.5–8.1)

## 2018-03-22 LAB — CBC WITH DIFFERENTIAL/PLATELET
Abs Immature Granulocytes: 0.02 10*3/uL (ref 0.00–0.07)
BASOS ABS: 0 10*3/uL (ref 0.0–0.1)
Basophils Relative: 1 %
EOS ABS: 0.1 10*3/uL (ref 0.0–0.5)
EOS PCT: 1 %
HEMATOCRIT: 36.7 % (ref 36.0–46.0)
Hemoglobin: 12.6 g/dL (ref 12.0–15.0)
Immature Granulocytes: 0 %
LYMPHS ABS: 1.7 10*3/uL (ref 0.7–4.0)
Lymphocytes Relative: 26 %
MCH: 34.1 pg — AB (ref 26.0–34.0)
MCHC: 34.3 g/dL (ref 30.0–36.0)
MCV: 99.2 fL (ref 80.0–100.0)
Monocytes Absolute: 0.5 10*3/uL (ref 0.1–1.0)
Monocytes Relative: 7 %
NRBC: 0 % (ref 0.0–0.2)
Neutro Abs: 4.3 10*3/uL (ref 1.7–7.7)
Neutrophils Relative %: 65 %
Platelets: 276 10*3/uL (ref 150–400)
RBC: 3.7 MIL/uL — ABNORMAL LOW (ref 3.87–5.11)
RDW: 12.9 % (ref 11.5–15.5)
WBC: 6.7 10*3/uL (ref 4.0–10.5)

## 2018-03-22 LAB — TSH: TSH: 0.994 u[IU]/mL (ref 0.308–3.960)

## 2018-03-22 MED ORDER — LEUPROLIDE ACETATE (3 MONTH) 22.5 MG IM KIT
PACK | INTRAMUSCULAR | Status: AC
  Filled 2018-03-22: qty 22.5

## 2018-03-22 MED ORDER — TRAMADOL HCL 50 MG PO TABS: 100.0000 mg | ORAL_TABLET | Freq: Four times a day (QID) | ORAL | 0 refills | Status: DC | PRN

## 2018-03-22 MED ORDER — SODIUM CHLORIDE 0.9 % IV SOLN
Freq: Once | INTRAVENOUS | Status: DC
  Filled 2018-03-22: qty 250

## 2018-03-22 MED ORDER — LEUPROLIDE ACETATE 7.5 MG IM KIT
7.5000 mg | PACK | Freq: Once | INTRAMUSCULAR | Status: AC
  Administered 2018-03-22: 7.5 mg via INTRAMUSCULAR
  Filled 2018-03-22: qty 7.5

## 2018-03-22 MED ORDER — SODIUM CHLORIDE 0.9% FLUSH
10.0000 mL | INTRAVENOUS | Status: DC | PRN
  Administered 2018-03-22: 10 mL
  Filled 2018-03-22: qty 10

## 2018-03-22 MED ORDER — HEPARIN SOD (PORK) LOCK FLUSH 100 UNIT/ML IV SOLN
500.0000 [IU] | Freq: Once | INTRAVENOUS | Status: AC | PRN
  Administered 2018-03-22: 500 [IU]
  Filled 2018-03-22: qty 5

## 2018-03-22 MED ORDER — SODIUM CHLORIDE 0.9 % IV SOLN
200.0000 mg | Freq: Once | INTRAVENOUS | Status: AC
  Administered 2018-03-22: 200 mg via INTRAVENOUS
  Filled 2018-03-22: qty 8

## 2018-03-22 NOTE — Patient Instructions (Signed)
[facility] Discharge Instructions for Patients Receiving Chemotherapy  Today you received the following chemotherapy agents:  Keytruda.  To help prevent nausea and vomiting after your treatment, we encourage you to take your nausea medication as directed.   If you develop nausea and vomiting that is not controlled by your nausea medication, call the clinic.   BELOW ARE SYMPTOMS THAT SHOULD BE REPORTED IMMEDIATELY:  *FEVER GREATER THAN 100.5 F  *CHILLS WITH OR WITHOUT FEVER  NAUSEA AND VOMITING THAT IS NOT CONTROLLED WITH YOUR NAUSEA MEDICATION  *UNUSUAL SHORTNESS OF BREATH  *UNUSUAL BRUISING OR BLEEDING  TENDERNESS IN MOUTH AND THROAT WITH OR WITHOUT PRESENCE OF ULCERS  *URINARY PROBLEMS  *BOWEL PROBLEMS  UNUSUAL RASH Items with * indicate a potential emergency and should be followed up as soon as possible.  Feel free to call the clinic should you have any questions or concerns. The clinic phone number is [phone].  Please show the CHEMO ALERT CARD at check-in to the Emergency Department and triage nurse.    

## 2018-03-22 NOTE — Progress Notes (Signed)
Per Cira Rue NP, ok to treat with total bilirubin 1.6.

## 2018-03-23 ENCOUNTER — Telehealth (HOSPITAL_COMMUNITY): Payer: Self-pay | Admitting: Emergency Medicine

## 2018-03-23 NOTE — Telephone Encounter (Signed)
Called patient and spoke with her about her results. All questions answered. Pt has picked up medicine.

## 2018-03-25 ENCOUNTER — Telehealth: Payer: Self-pay | Admitting: Hematology and Oncology

## 2018-03-25 NOTE — Telephone Encounter (Signed)
Called patient and verified he appointments for Jan 2020

## 2018-04-01 ENCOUNTER — Other Ambulatory Visit: Payer: Self-pay | Admitting: Hematology and Oncology

## 2018-04-01 ENCOUNTER — Telehealth: Payer: Self-pay

## 2018-04-01 DIAGNOSIS — B20 Human immunodeficiency virus [HIV] disease: Secondary | ICD-10-CM

## 2018-04-01 MED ORDER — TRAMADOL HCL 50 MG PO TABS: 100.0000 mg | ORAL_TABLET | Freq: Four times a day (QID) | ORAL | 0 refills | Status: DC | PRN

## 2018-04-01 NOTE — Telephone Encounter (Signed)
She called requesting Tramadol Rx be sent to to [facility] on [address]. Her car is in the shop and she has no transportation.

## 2018-04-01 NOTE — Telephone Encounter (Signed)
done

## 2018-04-11 ENCOUNTER — Telehealth: Payer: Self-pay

## 2018-04-11 ENCOUNTER — Other Ambulatory Visit: Payer: Self-pay | Admitting: Hematology and Oncology

## 2018-04-11 DIAGNOSIS — B20 Human immunodeficiency virus [HIV] disease: Secondary | ICD-10-CM

## 2018-04-11 MED ORDER — MIRTAZAPINE 15 MG PO TABS: 15.0000 mg | ORAL_TABLET | Freq: Every day | ORAL | 3 refills | Status: DC

## 2018-04-11 MED ORDER — TRAMADOL HCL 50 MG PO TABS: 100.0000 mg | ORAL_TABLET | Freq: Four times a day (QID) | ORAL | 0 refills | Status: DC | PRN

## 2018-04-11 NOTE — Telephone Encounter (Signed)
I sent them to the one on [address]

## 2018-04-11 NOTE — Telephone Encounter (Signed)
She called and left a message.  Called back. She needs a refill on Tramadol Rx and Remeron. Ask that they both be sent to [facility] pharmacy.

## 2018-04-19 ENCOUNTER — Inpatient Hospital Stay: Payer: 59 | Attending: Hematology and Oncology

## 2018-04-19 ENCOUNTER — Inpatient Hospital Stay: Payer: 59

## 2018-04-19 ENCOUNTER — Telehealth: Payer: Self-pay | Admitting: Hematology and Oncology

## 2018-04-19 ENCOUNTER — Encounter: Payer: Self-pay | Admitting: Hematology and Oncology

## 2018-04-19 ENCOUNTER — Inpatient Hospital Stay (HOSPITAL_BASED_OUTPATIENT_CLINIC_OR_DEPARTMENT_OTHER): Payer: 59 | Admitting: Hematology and Oncology

## 2018-04-19 DIAGNOSIS — Z95828 Presence of other vascular implants and grafts: Secondary | ICD-10-CM

## 2018-04-19 DIAGNOSIS — M1990 Unspecified osteoarthritis, unspecified site: Secondary | ICD-10-CM | POA: Insufficient documentation

## 2018-04-19 DIAGNOSIS — C8118 Nodular sclerosis classical Hodgkin lymphoma, lymph nodes of multiple sites: Secondary | ICD-10-CM

## 2018-04-19 DIAGNOSIS — Z79899 Other long term (current) drug therapy: Secondary | ICD-10-CM | POA: Insufficient documentation

## 2018-04-19 DIAGNOSIS — B20 Human immunodeficiency virus [HIV] disease: Secondary | ICD-10-CM | POA: Diagnosis not present

## 2018-04-19 DIAGNOSIS — C8198 Hodgkin lymphoma, unspecified, lymph nodes of multiple sites: Secondary | ICD-10-CM

## 2018-04-19 DIAGNOSIS — Z5111 Encounter for antineoplastic chemotherapy: Secondary | ICD-10-CM | POA: Diagnosis present

## 2018-04-19 DIAGNOSIS — Z5112 Encounter for antineoplastic immunotherapy: Secondary | ICD-10-CM | POA: Diagnosis present

## 2018-04-19 LAB — COMPREHENSIVE METABOLIC PANEL
ALK PHOS: 106 U/L (ref 38–126)
ALT: 14 U/L (ref 0–44)
ANION GAP: 8 (ref 5–15)
AST: 19 U/L (ref 15–41)
Albumin: 4.1 g/dL (ref 3.5–5.0)
BILIRUBIN TOTAL: 1.1 mg/dL (ref 0.3–1.2)
BUN: 8 mg/dL (ref 6–20)
CALCIUM: 10 mg/dL (ref 8.9–10.3)
CO2: 27 mmol/L (ref 22–32)
Chloride: 106 mmol/L (ref 98–111)
Creatinine, Ser: 0.98 mg/dL (ref 0.44–1.00)
GFR calc Af Amer: >60 mL/min (ref >60)
Glucose, Bld: 94 mg/dL (ref 70–99)
POTASSIUM: 3.8 mmol/L (ref 3.5–5.1)
Sodium: 141 mmol/L (ref 135–145)
TOTAL PROTEIN: 7.3 g/dL (ref 6.5–8.1)

## 2018-04-19 LAB — CBC WITH DIFFERENTIAL/PLATELET
Abs Immature Granulocytes: 0.01 10*3/uL (ref 0.00–0.07)
BASOS PCT: 1 %
Basophils Absolute: 0 10*3/uL (ref 0.0–0.1)
EOS ABS: 0.1 10*3/uL (ref 0.0–0.5)
Eosinophils Relative: 1 %
HCT: 39.5 % (ref 36.0–46.0)
HEMOGLOBIN: 13.9 g/dL (ref 12.0–15.0)
Immature Granulocytes: 0 %
Lymphocytes Relative: 32 %
Lymphs Abs: 1.9 10*3/uL (ref 0.7–4.0)
MCH: 34 pg (ref 26.0–34.0)
MCHC: 35.2 g/dL (ref 30.0–36.0)
MCV: 96.6 fL (ref 80.0–100.0)
MONO ABS: 0.5 10*3/uL (ref 0.1–1.0)
MONOS PCT: 9 %
Neutro Abs: 3.3 10*3/uL (ref 1.7–7.7)
Neutrophils Relative %: 57 %
Platelets: 231 10*3/uL (ref 150–400)
RBC: 4.09 MIL/uL (ref 3.87–5.11)
RDW: 12.5 % (ref 11.5–15.5)
WBC: 5.8 10*3/uL (ref 4.0–10.5)
nRBC: 0 % (ref 0.0–0.2)

## 2018-04-19 LAB — TSH: TSH: 1.702 u[IU]/mL (ref 0.308–3.960)

## 2018-04-19 MED ORDER — LEUPROLIDE ACETATE 7.5 MG IM KIT
7.5000 mg | PACK | Freq: Once | INTRAMUSCULAR | Status: AC
  Administered 2018-04-19: 7.5 mg via INTRAMUSCULAR
  Filled 2018-04-19: qty 7.5

## 2018-04-19 MED ORDER — SODIUM CHLORIDE 0.9 % IV SOLN
Freq: Once | INTRAVENOUS | Status: AC
  Administered 2018-04-19: 10:00:00 via INTRAVENOUS
  Filled 2018-04-19: qty 250

## 2018-04-19 MED ORDER — HEPARIN SOD (PORK) LOCK FLUSH 100 UNIT/ML IV SOLN
500.0000 [IU] | Freq: Once | INTRAVENOUS | Status: AC | PRN
  Administered 2018-04-19: 500 [IU]
  Filled 2018-04-19: qty 5

## 2018-04-19 MED ORDER — SODIUM CHLORIDE 0.9 % IV SOLN
200.0000 mg | Freq: Once | INTRAVENOUS | Status: AC
  Administered 2018-04-19: 200 mg via INTRAVENOUS
  Filled 2018-04-19: qty 8

## 2018-04-19 MED ORDER — SODIUM CHLORIDE 0.9% FLUSH
10.0000 mL | INTRAVENOUS | Status: DC | PRN
  Administered 2018-04-19: 10 mL via INTRAVENOUS
  Filled 2018-04-19: qty 10

## 2018-04-19 MED ORDER — SODIUM CHLORIDE 0.9% FLUSH
10.0000 mL | INTRAVENOUS | Status: DC | PRN
  Administered 2018-04-19: 10 mL
  Filled 2018-04-19: qty 10

## 2018-04-19 MED ORDER — LEUPROLIDE ACETATE (3 MONTH) 22.5 MG IM KIT
PACK | INTRAMUSCULAR | Status: AC
  Filled 2018-04-19: qty 22.5

## 2018-04-19 NOTE — Patient Instructions (Signed)
[facility] Discharge Instructions for Patients Receiving Chemotherapy  Today you received the following chemotherapy agents:  pembrolizumab (Keytruda) and lupron.  To help prevent nausea and vomiting after your treatment, we encourage you to take your nausea medication as directed.   If you develop nausea and vomiting that is not controlled by your nausea medication, call the clinic.   BELOW ARE SYMPTOMS THAT SHOULD BE REPORTED IMMEDIATELY:  *FEVER GREATER THAN 100.5 F  *CHILLS WITH OR WITHOUT FEVER  NAUSEA AND VOMITING THAT IS NOT CONTROLLED WITH YOUR NAUSEA MEDICATION  *UNUSUAL SHORTNESS OF BREATH  *UNUSUAL BRUISING OR BLEEDING  TENDERNESS IN MOUTH AND THROAT WITH OR WITHOUT PRESENCE OF ULCERS  *URINARY PROBLEMS  *BOWEL PROBLEMS  UNUSUAL RASH Items with * indicate a potential emergency and should be followed up as soon as possible.  Feel free to call the clinic should you have any questions or concerns. The clinic phone number is [phone].  Please show the [place] at check-in to the Emergency Department and triage nurse.

## 2018-04-19 NOTE — Patient Instructions (Signed)

## 2018-04-19 NOTE — Progress Notes (Signed)
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  Due to her prior history of recurrent disease, she is in agreement to go on maintenance therapy every 4 weeks. I plan to repeat imaging study again in March 2020  Human immunodeficiency virus (HIV) disease ([place]) She will continue anti-retroviral treatment as directed Her last evaluation in September showed minimum detectable viral load She will continue close follow-up with infectious disease service.  Inflammatory arthritis The patient has received prednisone therapy in the past.  She is on tramadol on as-needed basis.  Pain is well controlled.  She was reminded not to exceed the total prescribed dose.   No orders of the defined types were placed in this encounter.   INTERVAL HISTORY: Please see below for problem oriented charting. She returns for her chemotherapy follow-up She was recently diagnosed with vaginal infection that was subsequently treated successfully with antibiotics.  She had negative Pap smear.  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    05/07/2014 Imaging    CT scan of the chest show right thoracic inlet lymphadenopathy    06/07/2014 Procedure    She underwent ultrasound-guided core biopsy of the neck lymph node    06/07/2014 Pathology Results    Accession: [id number] biopsy confirmed diagnosis of Hodgkin lymphoma.    06/15/2014 Imaging    Echocardiogram showed preserved ejection fraction    07/09/2014 - 07/12/2014 Hospital Admission    She was admitted to the hospital for severe anemia.    07/27/2014 Procedure    She had placement of port    07/31/2014 - 09/11/2014 Chemotherapy    She received dose adjusted chemotherapy due to abnormal liver function tests and severe anemia. Treatment was delayed due to noncompliance  and subsequently stopped because the patient failed to keep appointments    01/11/2015 Imaging    Repeat PET CT scan showed response to treatment    01/28/2015 - 06/18/2015 Chemotherapy    ABVD was restarted with full dose.    02/08/2015 - 02/10/2015 Hospital Admission    The patient was admitted to the hospital due to pancytopenia and profuse diarrhea. Cultures were negative. She was placed on ciprofloxacin.    02/11/2015 Adverse Reaction    Treatment was placed on hold due to recent infection.    04/11/2015 Imaging    PET CT scan showed near complete response. Incidental finding of an abnormal bone lesion, indeterminate. She is not symptomatic. Recommendation from [facility] to observe    07/11/2015 Imaging    PET CT scan showed abnormal new bone lesions, suggestive of possible disease progression    07/23/2015 Bone Marrow Biopsy    She  underwent bone biopsy    07/23/2015 Pathology Results    Accession: [id number]  biopsy was negative for cancer    11/21/2015 Surgery    She had surgery for ectopic pregnancy    01/29/2016 Imaging    Ct chest, abdomen and pelvis showed pelvic and retroperitoneal lymphadenopathy, as  above, concerning for residual disease. There is also a mildly enlarged posterior mediastinal lymph node measuring 11 mm adjacent to the distal descending thoracic aorta. This may represent an additional focus of disease, but is the only finding of concern in the thorax on today's examination. Sclerosis in the right ilium at site of previously noted metabolically active lesion, grossly unchanged. No other definite osseous lesions are identified on today's examination. Spleen is normal in size and appearance.    02/14/2016 PET scan    Interval disease worsening with new foci of hypermetabolic activity in multiple retroperitoneal and pelvic lymph nodes as well as AP window and left hilar lymph nodes. (Deauville 5). There is also overall worsening of the osseous disease.    05/11/2016 Pathology Results    Diagnosis Lymph node, needle/core biopsy, Left para-aortic retroperitoneal - CLASSICAL HODGKIN LYMPHOMA. - SEE ONCOLOGY TABLE. Microscopic Comment LYMPHOMA Histologic type: Classical Hodgkin lymphoma. Grade (if applicable): N/A Flow cytometry: Not done. Immunohistochemical stains: CD15, CD20, CD3, LCA, PAX-5, CD30 with appropriate controls. Touch preps/imprints: Not performed. Comments: The sections show small needle core biopsy fragments displaying a polymorphous cellular proliferation of small lymphocytes, plasma cells, eosinophils, and large atypical mononuclear and multilobated lymphoid cells with features of Reed-Sternberg cells and variants. This is associated with patchy areas of fibrosis. Immunohistochemical stains were performed and show that the large atypical lymphoid cells are positive for CD30, CD15 and PAX-5 and negative for LCA, CD20, CD3. The small lymphoid cells in the background show a mixture of T and B cells with predominance of T cells. The overall morphologic and histologic features are consistent with classical Hodgkin lymphoma. Further subtyping is challenging in limited small  biopsy fragments but the patchy fibrosis suggests nodular sclerosis type.     05/11/2016 Procedure    She underwent CT guided biopsy of retroperitoneal lymph node    05/26/2016 Procedure    Successful placement of a right IJ approach Power Port with ultrasound and fluoroscopic guidance. The catheter is ready for use.    06/02/2016 PET scan    Mixed response to chemotherapy with some lymph nodes decreased in metabolic activity and some lymph nodes increase metabolic activity. Lymph node stations including mediastinum, periaortic retroperitoneum, and obturator node stations. Activity is remains relatively intense Deauville 4 2. LEFT infrahilar nodule / lymph node with intense metabolic activity decreased from prior. ( Deauville 4 ). 3. 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 Deauville 5    06/03/2016 - 06/05/2016 Hospital Admission    She was admitted to the hospital for cycle 1 of ICE chemotherapy    06/24/2016 - 06/26/2016 Hospital Admission    She received cycle 2 of ICE chemo    07/07/2016 PET scan    Resolution of prior hypermetabolic adenopathy and resolution of prior osseous foci of hypermetabolic activity compatible with essentially complete response to therapy (Deauville 1). 2. Generalized reduced activity in the L4 vertebral body and the type of finding which would typically reflect prior radiation therapy. 3. Stable septated fatty right pelvic lesion, possibly a dermoid or lipoma, not hypermetabolic    6/62/9476 PET scan    Hypermetabolic lesion along the L3 vertebral body and left  posterior elements, max SUV 7.8 (Deauville 5). Hypermetabolic lesion along the left inferior pubic ramus, max SUV 4.9 (Deauville 4).  IMPRESSION: Prevascular lymphadenopathy, reflecting nodal recurrence (Deauville 4). Hypermetabolic osseous metastases involving the L3 vertebral body/posterior elements and left inferior pubic ramus (Deauville 4-5). Hypermetabolism along the  endometrium, new, possibly reactive/physiologic. Consider pelvic ultrasound and/or endometrial sampling as clinically warranted.    11/04/2016 - 02/24/2017 Chemotherapy    She received Brentuximab    12/11/2016 PET scan    1. Mixed response to therapy within the skeleton. Lesions at L3 is decreased in size and metabolic activity. Residual activity remains above liver ( Deauville 4) 2. Increased activity in the LEFT sacrum with metabolic activity above liver activity ( Deauville 4 3. Decrease in size and metabolic activity of anterior mediastinal tissue consistent with resolution of thymic hyperplasia or resolution of lymphoma metabolic activity ( Deauville 2). 4. No new lymphadenopathy. Normal spleen and liver.    03/25/2017 PET scan    1. Increased metabolic activity in several normal sized retroperitoneal and right pelvic lymph nodes, Deauville 4. 2. Increase in size and metabolic activity of lesions in the L3 and L4 vertebral bodies and in the left sacrum, Deauville 5. 3. No recurrence of anterior mediastinal abnormal activity. 4. Other imaging findings of potential clinical significance: Chronic ethmoid and left maxillary sinusitis. Paraseptal emphysema at the lung apices. Right ovarian dermoid.    04/06/2017 -  Chemotherapy    The patient had Keytruda    06/09/2017 PET scan    1. Overall significant improvement with reduction in nodal activity. Several retroperitoneal nodes which were previously Deauville 4 are currently Deauville 3. The right common iliac lymph node remains at Deauville 4 with maximum SUV of 3.4, but is improved from prior SUV of 4.3. 2. The bony lesions show the greatest improvement, with the previously Deauville 5 hypermetabolic lesions currently no longer of higher metabolic activity than surrounding bone marrow, currently measuring at Deauville 3. 3. 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 4. No new lesions are  identified. 5. Emphysema (ICD10-J43.9).    08/05/2017 Imaging    1. Somewhat complex left adnexal lesion, potentially hemorrhagic cyst measuring 2.9 x 2.8 cm. The possibility of torsion in the left ovary must be considered. Advise correlation with pelvic ultrasound including Doppler assessment to further evaluate.  2.  Right ovarian dermoid, unchanged from recent PET-CT examination.  3. No adenopathy by size criteria evident. Appearance of subcentimeter mesenteric lymph nodes is stable compared to recent study.  4. No bone lesions appreciable by CT. Areas of abnormal radiotracer uptake in the mid lumbar spine noted on recent PET-CT examination. No destruction 2 or lytic change noted in the lumbar vertebrae currently there.  5. No bowel obstruction. No abscess. No periappendiceal region inflammation.  6.  Small hiatal hernia with fluid in distal esophagus.  7.  Foci of coronary artery calcification, advanced for age.  8.  Spleen normal in size and contour.  9. Small hiatal hernia with mild fluid in the distal esophagus, likely indicative of a degree of reflux.    01/04/2018 Imaging    1. No findings to suggest residual/recurrent lymphoma in the chest, abdomen or pelvis. 2. Right ovarian dermoid slightly larger than prior examinations, currently measuring 3.8 x 2.8 x 3.1 cm. 3. Additional incidental findings, as above.     REVIEW OF SYSTEMS:   Constitutional: Denies fevers, chills or abnormal weight loss Eyes: Denies blurriness of vision Ears, nose, mouth, throat,  and face: Denies mucositis or sore throat Respiratory: Denies cough, dyspnea or wheezes Cardiovascular: Denies palpitation, chest discomfort or lower extremity swelling Gastrointestinal:  Denies nausea, heartburn or change in bowel habits Skin: Denies abnormal skin rashes Lymphatics: Denies new lymphadenopathy or easy bruising Neurological:Denies numbness, tingling or new weaknesses Behavioral/Psych: Mood is  stable, no new changes  All other systems were reviewed with the patient and are negative.  I have reviewed the past medical history, past surgical history, social history and family history with the patient and they are unchanged from previous note.  ALLERGIES:  is allergic to temazepam.  MEDICATIONS:  Current Outpatient Medications  Medication Sig Dispense Refill  . atazanavir (REYATAZ) 300 MG capsule TAKE ONE CAPSULE BY MOUTH DAILY WITH BREAKFAST. TAKE WITH NORVIR 90 capsule 1  . cholecalciferol (VITAMIN D) 1000 units tablet Take 1 tablet (1,000 Units total) by mouth daily. 30 tablet 9  . emtricitabine-tenofovir (TRUVADA) 200-300 MG tablet Take 1 tablet by mouth daily. 90 tablet 1  . hydrocortisone (ANUSOL-HC) 2.5 % rectal cream Place 1 application rectally 2 (two) times daily. 30 g 1  . megestrol (MEGACE ES) 625 MG/5ML suspension TAKE 5 ML BY MOUTH DAILY 150 mL 3  . mirtazapine (REMERON) 15 MG tablet Take 1 tablet (15 mg total) by mouth at bedtime. 30 tablet 3  . ondansetron (ZOFRAN ODT) 8 MG disintegrating tablet Take 1 tablet (8 mg total) by mouth every 8 (eight) hours as needed for nausea. 20 tablet 0  . Polyvinyl Alcohol-Povidone (CLEAR EYES ALL SEASONS OP) Place 1 drop into both eyes daily.    . ritonavir (NORVIR) 100 MG TABS tablet TAKE 1 TABLET BY MOUTH EVERY MORNING WITH BREAKFAST. TAKE WITH REYATAZ 90 tablet 1  . traMADol (ULTRAM) 50 MG tablet Take 2 tablets (100 mg total) by mouth every 6 (six) hours as needed for moderate pain. 90 tablet 0  . tretinoin (RETIN-A) 0.025 % cream Wash face and wait at least 20 minutes. Apply a pea size amount to the face except upper eyelids. Skip a few nights if you get too dry.    . valACYclovir (VALTREX) 1000 MG tablet Take 1 tablet (1,000 mg total) by mouth 2 (two) times daily. 14 tablet 0   No current facility-administered medications for this visit.     PHYSICAL EXAMINATION: ECOG PERFORMANCE STATUS: 1 - Symptomatic but completely  ambulatory  Vitals:   04/19/18 0939  BP: 121/74  Pulse: (!) 57  Resp: 18  Temp: 98.8 F (37.1 C)   Filed Weights   04/19/18 0939  Weight: 108 lb 12.8 oz (49.4 kg)    GENERAL:alert, no distress and comfortable SKIN: skin color, texture, turgor are normal, no rashes or significant lesions EYES: normal, Conjunctiva are pink and non-injected, sclera clear OROPHARYNX:no exudate, no erythema and lips, buccal mucosa, and tongue normal  NECK: supple, thyroid normal size, non-tender, without nodularity LYMPH:  no palpable lymphadenopathy in the cervical, axillary or inguinal LUNGS: clear to auscultation and percussion with normal breathing effort HEART: regular rate & rhythm and no murmurs and no lower extremity edema ABDOMEN:abdomen soft, non-tender and normal bowel sounds Musculoskeletal:no cyanosis of digits and no clubbing  NEURO: alert & oriented x 3 with fluent speech, no focal motor/sensory deficits  LABORATORY DATA:  I have reviewed the data as listed    Component Value Date/Time   NA 141 04/19/2018 0824   NA 140 04/29/2017 0745   K 3.8 04/19/2018 0824   K 3.3 (L) 04/29/2017 0745  CL 106 04/19/2018 0824   CO2 27 04/19/2018 0824   CO2 23 04/29/2017 0745   GLUCOSE 94 04/19/2018 0824   GLUCOSE 83 04/29/2017 0745   BUN 8 04/19/2018 0824   BUN 10.7 04/29/2017 0745   CREATININE 0.98 04/19/2018 0824   CREATININE 1.2 (H) 04/29/2017 0745   CALCIUM 10.0 04/19/2018 0824   CALCIUM 9.8 04/29/2017 0745   PROT 7.3 04/19/2018 0824   PROT 7.6 04/29/2017 0745   ALBUMIN 4.1 04/19/2018 0824   ALBUMIN 4.0 04/29/2017 0745   AST 19 04/19/2018 0824   AST 19 04/29/2017 0745   ALT 14 04/19/2018 0824   ALT 19 04/29/2017 0745   ALKPHOS 106 04/19/2018 0824   ALKPHOS 81 04/29/2017 0745   BILITOT 1.1 04/19/2018 0824   BILITOT 1.31 (H) 04/29/2017 0745   GFRNONAA >60 04/19/2018 0824   GFRNONAA 83 02/20/2016 0908   GFRAA >60 04/19/2018 0824   GFRAA >89 02/20/2016 0908    No results  found for: SPEP, UPEP  Lab Results  Component Value Date   WBC 5.8 04/19/2018   NEUTROABS 3.3 04/19/2018   HGB 13.9 04/19/2018   HCT 39.5 04/19/2018   MCV 96.6 04/19/2018   PLT 231 04/19/2018      Chemistry      Component Value Date/Time   NA 141 04/19/2018 0824   NA 140 04/29/2017 0745   K 3.8 04/19/2018 0824   K 3.3 (L) 04/29/2017 0745   CL 106 04/19/2018 0824   CO2 27 04/19/2018 0824   CO2 23 04/29/2017 0745   BUN 8 04/19/2018 0824   BUN 10.7 04/29/2017 0745   CREATININE 0.98 04/19/2018 0824   CREATININE 1.2 (H) 04/29/2017 0745      Component Value Date/Time   CALCIUM 10.0 04/19/2018 0824   CALCIUM 9.8 04/29/2017 0745   ALKPHOS 106 04/19/2018 0824   ALKPHOS 81 04/29/2017 0745   AST 19 04/19/2018 0824   AST 19 04/29/2017 0745   ALT 14 04/19/2018 0824   ALT 19 04/29/2017 0745   BILITOT 1.1 04/19/2018 0824   BILITOT 1.31 (H) 04/29/2017 0745      All questions were answered. The patient knows to call the clinic with any problems, questions or concerns. No barriers to learning was detected.  I spent 15 minutes counseling the patient face to face. The total time spent in the appointment was 20 minutes and more than 50% was on counseling and review of test results  Heath Lark, MD 04/19/2018 9:57 AM

## 2018-04-19 NOTE — Telephone Encounter (Signed)
Per 12/24 no new orders

## 2018-04-19 NOTE — Assessment & Plan Note (Signed)
She will continue anti-retroviral treatment as directed Her last evaluation in September showed minimum detectable viral load She will continue close follow-up with infectious disease service.

## 2018-04-19 NOTE — Assessment & Plan Note (Signed)
The patient has received prednisone therapy in the past.  She is on tramadol on as-needed basis.  Pain is well controlled.  She was reminded not to exceed the total prescribed dose.

## 2018-04-19 NOTE — Assessment & Plan Note (Signed)
She has complete response to therapy in her last imaging study Her symptoms of inflammatory arthritis is due to checkpoint inhibitor side effects She has responded well to intermittent low-dose prednisone therapy along with tramadol as needed We discussed the risk and benefits of continuing treatment.  Due to her prior history of recurrent disease, she is in agreement to go on maintenance therapy every 4 weeks. I plan to repeat imaging study again in March 2020

## 2018-04-21 ENCOUNTER — Telehealth: Payer: Self-pay | Admitting: *Deleted

## 2018-04-21 ENCOUNTER — Other Ambulatory Visit: Payer: Self-pay | Admitting: Hematology and Oncology

## 2018-04-21 DIAGNOSIS — B20 Human immunodeficiency virus [HIV] disease: Secondary | ICD-10-CM

## 2018-04-21 MED ORDER — TRAMADOL HCL 50 MG PO TABS: 100.0000 mg | ORAL_TABLET | Freq: Four times a day (QID) | ORAL | 0 refills | Status: DC | PRN

## 2018-04-21 NOTE — Telephone Encounter (Signed)
Patient called to request a refill of Tramadol. Can this be sent electronically to her [facility] on [address].

## 2018-05-02 ENCOUNTER — Telehealth: Payer: Self-pay

## 2018-05-02 ENCOUNTER — Other Ambulatory Visit: Payer: Self-pay | Admitting: Hematology and Oncology

## 2018-05-02 DIAGNOSIS — B20 Human immunodeficiency virus [HIV] disease: Secondary | ICD-10-CM

## 2018-05-02 MED ORDER — TRAMADOL HCL 50 MG PO TABS: 100.0000 mg | ORAL_TABLET | Freq: Four times a day (QID) | ORAL | 0 refills | Status: DC | PRN

## 2018-05-02 NOTE — Telephone Encounter (Signed)
She called and left a message requesting refill on Tramadol. Ask that it be sent to [facility].

## 2018-05-06 ENCOUNTER — Emergency Department (HOSPITAL_COMMUNITY)
Admission: EM | Admit: 2018-05-06 | Discharge: 2018-05-06 | Disposition: A | Discharge status: Home / Self Care | Payer: Medicare HMO | Attending: Emergency Medicine | Admitting: Emergency Medicine

## 2018-05-06 ENCOUNTER — Other Ambulatory Visit: Payer: Self-pay

## 2018-05-06 ENCOUNTER — Encounter (HOSPITAL_COMMUNITY): Payer: Self-pay | Admitting: Emergency Medicine

## 2018-05-06 DIAGNOSIS — Z79899 Other long term (current) drug therapy: Secondary | ICD-10-CM | POA: Diagnosis not present

## 2018-05-06 DIAGNOSIS — R197 Diarrhea, unspecified: Secondary | ICD-10-CM | POA: Diagnosis not present

## 2018-05-06 DIAGNOSIS — F17290 Nicotine dependence, other tobacco product, uncomplicated: Secondary | ICD-10-CM | POA: Insufficient documentation

## 2018-05-06 DIAGNOSIS — R1110 Vomiting, unspecified: Secondary | ICD-10-CM | POA: Diagnosis present

## 2018-05-06 LAB — I-STAT CG4 LACTIC ACID, ED: Lactic Acid, Venous: 2.02 mmol/L (ref 0.5–1.9)

## 2018-05-06 LAB — CBC WITH DIFFERENTIAL/PLATELET
Abs Immature Granulocytes: 0.08 10*3/uL — ABNORMAL HIGH (ref 0.00–0.07)
Basophils Absolute: 0 10*3/uL (ref 0.0–0.1)
Basophils Relative: 0 %
Eosinophils Absolute: 0.1 10*3/uL (ref 0.0–0.5)
Eosinophils Relative: 1 %
HCT: 42.4 % (ref 36.0–46.0)
HEMOGLOBIN: 14.2 g/dL (ref 12.0–15.0)
Immature Granulocytes: 1 %
Lymphocytes Relative: 9 %
Lymphs Abs: 1.2 10*3/uL (ref 0.7–4.0)
MCH: 33.8 pg (ref 26.0–34.0)
MCHC: 33.5 g/dL (ref 30.0–36.0)
MCV: 101 fL — ABNORMAL HIGH (ref 80.0–100.0)
Monocytes Absolute: 0.9 10*3/uL (ref 0.1–1.0)
Monocytes Relative: 6 %
Neutro Abs: 11.2 10*3/uL — ABNORMAL HIGH (ref 1.7–7.7)
Neutrophils Relative %: 83 %
Platelets: 278 10*3/uL (ref 150–400)
RBC: 4.2 MIL/uL (ref 3.87–5.11)
RDW: 13 % (ref 11.5–15.5)
WBC: 13.5 10*3/uL — ABNORMAL HIGH (ref 4.0–10.5)
nRBC: 0 % (ref 0.0–0.2)

## 2018-05-06 LAB — COMPREHENSIVE METABOLIC PANEL
ALBUMIN: 4.3 g/dL (ref 3.5–5.0)
ALT: 16 U/L (ref 0–44)
AST: 22 U/L (ref 15–41)
Alkaline Phosphatase: 93 U/L (ref 38–126)
Anion gap: 8 (ref 5–15)
BUN: 15 mg/dL (ref 6–20)
CO2: 25 mmol/L (ref 22–32)
Calcium: 9 mg/dL (ref 8.9–10.3)
Chloride: 107 mmol/L (ref 98–111)
Creatinine, Ser: 0.97 mg/dL (ref 0.44–1.00)
GFR calc Af Amer: >60 mL/min (ref >60)
GFR calc non Af Amer: >60 mL/min (ref >60)
GLUCOSE: 111 mg/dL — AB (ref 70–99)
Potassium: 3.3 mmol/L — ABNORMAL LOW (ref 3.5–5.1)
Sodium: 140 mmol/L (ref 135–145)
Total Bilirubin: 1.2 mg/dL (ref 0.3–1.2)
Total Protein: 7.4 g/dL (ref 6.5–8.1)

## 2018-05-06 LAB — URINALYSIS, ROUTINE W REFLEX MICROSCOPIC
BILIRUBIN URINE: NEGATIVE
Glucose, UA: NEGATIVE mg/dL
Hgb urine dipstick: NEGATIVE
Ketones, ur: NEGATIVE mg/dL
Leukocytes, UA: NEGATIVE
NITRITE: NEGATIVE
Protein, ur: NEGATIVE mg/dL
Specific Gravity, Urine: 1.017 (ref 1.005–1.030)
pH: 6 (ref 5.0–8.0)

## 2018-05-06 LAB — LIPASE, BLOOD: Lipase: 32 U/L (ref 11–51)

## 2018-05-06 LAB — I-STAT BETA HCG BLOOD, ED (MC, WL, AP ONLY): I-stat hCG, quantitative: <5 m[IU]/mL (ref <5)

## 2018-05-06 MED ORDER — SODIUM CHLORIDE 0.9 % IV BOLUS
1000.0000 mL | Freq: Once | INTRAVENOUS | Status: AC
  Administered 2018-05-06: 1000 mL via INTRAVENOUS

## 2018-05-06 MED ORDER — ONDANSETRON HCL 4 MG/2ML IJ SOLN
4.0000 mg | Freq: Once | INTRAMUSCULAR | Status: AC
  Administered 2018-05-06: 4 mg via INTRAVENOUS
  Filled 2018-05-06: qty 2

## 2018-05-06 MED ORDER — FENTANYL CITRATE (PF) 100 MCG/2ML IJ SOLN
50.0000 ug | Freq: Once | INTRAMUSCULAR | Status: AC
  Administered 2018-05-06: 50 ug via INTRAVENOUS
  Filled 2018-05-06: qty 2

## 2018-05-06 NOTE — ED Notes (Signed)
Bed: EE03 Expected date:  Expected time:  Means of arrival:  Comments: Vomiting cancer patient
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
## 2018-05-06 NOTE — Discharge Instructions (Addendum)
Continue your Zofran every 8 hours as needed for nausea. Follow up with your doctor if symptoms persist. Return here with any worsening symptoms or new concerns.

## 2018-05-06 NOTE — ED Triage Notes (Signed)
Pt from home callled EMS d/t N/V and abd pain onset 00:30. Hx of cancer  Taking chemo last Tx 04/19/18  Takes chemo once per month.  VS 130/80 HR 70 Resp 18  Sat 99% CBG 131

## 2018-05-09 ENCOUNTER — Telehealth: Payer: Self-pay

## 2018-05-09 NOTE — Telephone Encounter (Signed)
She called and left a message to call her.  Called back. She was having a problem with her Tramadol Rx. The pharmacy was able to straighten it out. Instructed to call the office for further needs. She verbalized understanding.

## 2018-05-13 ENCOUNTER — Telehealth: Payer: Self-pay

## 2018-05-13 ENCOUNTER — Other Ambulatory Visit: Payer: Self-pay | Admitting: Hematology and Oncology

## 2018-05-13 DIAGNOSIS — B20 Human immunodeficiency virus [HIV] disease: Secondary | ICD-10-CM

## 2018-05-13 MED ORDER — TRAMADOL HCL 50 MG PO TABS: 100.0000 mg | ORAL_TABLET | Freq: Four times a day (QID) | ORAL | 0 refills | Status: DC | PRN

## 2018-05-13 NOTE — Telephone Encounter (Signed)
She called requesting a refill on Tramadol. Ask that the refill be sent [facility].

## 2018-05-13 NOTE — Telephone Encounter (Signed)
done

## 2018-05-17 ENCOUNTER — Inpatient Hospital Stay: Payer: Medicare HMO

## 2018-05-17 ENCOUNTER — Inpatient Hospital Stay: Payer: Medicare HMO | Attending: Hematology and Oncology

## 2018-05-17 ENCOUNTER — Inpatient Hospital Stay (HOSPITAL_BASED_OUTPATIENT_CLINIC_OR_DEPARTMENT_OTHER): Payer: Medicare HMO | Admitting: Hematology and Oncology

## 2018-05-17 ENCOUNTER — Encounter: Payer: Self-pay | Admitting: Hematology and Oncology

## 2018-05-17 ENCOUNTER — Other Ambulatory Visit: Payer: Self-pay | Admitting: Hematology and Oncology

## 2018-05-17 VITALS — BP 111/77 | HR 75 | Temp 98.4°F | Resp 18 | Ht 64.0 in | Wt 107.0 lb

## 2018-05-17 DIAGNOSIS — Z95828 Presence of other vascular implants and grafts: Secondary | ICD-10-CM

## 2018-05-17 DIAGNOSIS — G893 Neoplasm related pain (acute) (chronic): Secondary | ICD-10-CM | POA: Diagnosis not present

## 2018-05-17 DIAGNOSIS — E559 Vitamin D deficiency, unspecified: Secondary | ICD-10-CM

## 2018-05-17 DIAGNOSIS — C8118 Nodular sclerosis classical Hodgkin lymphoma, lymph nodes of multiple sites: Secondary | ICD-10-CM

## 2018-05-17 DIAGNOSIS — C8198 Hodgkin lymphoma, unspecified, lymph nodes of multiple sites: Secondary | ICD-10-CM

## 2018-05-17 DIAGNOSIS — Z5111 Encounter for antineoplastic chemotherapy: Secondary | ICD-10-CM

## 2018-05-17 DIAGNOSIS — Z79899 Other long term (current) drug therapy: Secondary | ICD-10-CM | POA: Diagnosis not present

## 2018-05-17 DIAGNOSIS — R0982 Postnasal drip: Secondary | ICD-10-CM | POA: Insufficient documentation

## 2018-05-17 DIAGNOSIS — Z5112 Encounter for antineoplastic immunotherapy: Secondary | ICD-10-CM | POA: Diagnosis present

## 2018-05-17 LAB — COMPREHENSIVE METABOLIC PANEL
ALT: 14 U/L (ref 0–44)
AST: 20 U/L (ref 15–41)
Albumin: 4.2 g/dL (ref 3.5–5.0)
Alkaline Phosphatase: 125 U/L (ref 38–126)
Anion gap: 11 (ref 5–15)
BUN: 10 mg/dL (ref 6–20)
CO2: 24 mmol/L (ref 22–32)
Calcium: 10 mg/dL (ref 8.9–10.3)
Chloride: 105 mmol/L (ref 98–111)
Creatinine, Ser: 1 mg/dL (ref 0.44–1.00)
GFR calc Af Amer: >60 mL/min (ref >60)
GFR calc non Af Amer: >60 mL/min (ref >60)
Glucose, Bld: 79 mg/dL (ref 70–99)
Potassium: 3.9 mmol/L (ref 3.5–5.1)
Sodium: 140 mmol/L (ref 135–145)
Total Bilirubin: 0.6 mg/dL (ref 0.3–1.2)
Total Protein: 7.5 g/dL (ref 6.5–8.1)

## 2018-05-17 LAB — CBC WITH DIFFERENTIAL/PLATELET
Abs Immature Granulocytes: 0.01 10*3/uL (ref 0.00–0.07)
Basophils Absolute: 0 10*3/uL (ref 0.0–0.1)
Basophils Relative: 1 %
Eosinophils Absolute: 0.2 10*3/uL (ref 0.0–0.5)
Eosinophils Relative: 3 %
HCT: 39.3 % (ref 36.0–46.0)
HEMOGLOBIN: 13.7 g/dL (ref 12.0–15.0)
Immature Granulocytes: 0 %
Lymphocytes Relative: 40 %
Lymphs Abs: 2.2 10*3/uL (ref 0.7–4.0)
MCH: 33.7 pg (ref 26.0–34.0)
MCHC: 34.9 g/dL (ref 30.0–36.0)
MCV: 96.6 fL (ref 80.0–100.0)
MONO ABS: 0.4 10*3/uL (ref 0.1–1.0)
Monocytes Relative: 6 %
Neutro Abs: 2.8 10*3/uL (ref 1.7–7.7)
Neutrophils Relative %: 50 %
Platelets: 254 10*3/uL (ref 150–400)
RBC: 4.07 MIL/uL (ref 3.87–5.11)
RDW: 12.2 % (ref 11.5–15.5)
WBC: 5.6 10*3/uL (ref 4.0–10.5)
nRBC: 0 % (ref 0.0–0.2)

## 2018-05-17 LAB — TSH: TSH: 1.72 u[IU]/mL (ref 0.308–3.960)

## 2018-05-17 MED ORDER — SODIUM CHLORIDE 0.9% FLUSH
10.0000 mL | INTRAVENOUS | Status: DC | PRN
  Administered 2018-05-17: 10 mL
  Filled 2018-05-17: qty 10

## 2018-05-17 MED ORDER — SODIUM CHLORIDE 0.9 % IV SOLN
200.0000 mg | Freq: Once | INTRAVENOUS | Status: AC
  Administered 2018-05-17: 200 mg via INTRAVENOUS
  Filled 2018-05-17: qty 8

## 2018-05-17 MED ORDER — HEPARIN SOD (PORK) LOCK FLUSH 100 UNIT/ML IV SOLN
500.0000 [IU] | Freq: Once | INTRAVENOUS | Status: AC | PRN
  Administered 2018-05-17: 500 [IU]
  Filled 2018-05-17: qty 5

## 2018-05-17 MED ORDER — LEUPROLIDE ACETATE 7.5 MG IM KIT
7.5000 mg | PACK | Freq: Once | INTRAMUSCULAR | Status: DC
  Filled 2018-05-17: qty 7.5

## 2018-05-17 MED ORDER — FLUTICASONE PROPIONATE 50 MCG/ACT NA SUSP: 1.0000 | Freq: Every day | NASAL | 0 refills | Status: DC

## 2018-05-17 MED ORDER — SODIUM CHLORIDE 0.9 % IV SOLN
Freq: Once | INTRAVENOUS | Status: AC
  Administered 2018-05-17: 10:00:00 via INTRAVENOUS
  Filled 2018-05-17: qty 250

## 2018-05-17 NOTE — Assessment & Plan Note (Signed)
She has complete response to therapy in her last imaging study Her symptoms of inflammatory arthritis is due to checkpoint inhibitor side effects She has responded well to intermittent low-dose prednisone therapy along with tramadol as needed We discussed the risk and benefits of continuing treatment.  Due to her prior history of recurrent disease, she is in agreement to go on maintenance therapy every 4 weeks. I plan to repeat imaging study again in March 2020

## 2018-05-17 NOTE — Assessment & Plan Note (Signed)
She has cough with postnasal drip I recommend Flonase I recommend her to stop smoking

## 2018-05-17 NOTE — Assessment & Plan Note (Signed)
She has multiple chronic joint pain and is dependent on tramadol to control her joint pain She is taking vitamin D supplement as prescribed I plan to recheck vitamin D level in her next visit

## 2018-05-17 NOTE — Progress Notes (Signed)
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  Due to her prior history of recurrent disease, she is in agreement to go on maintenance therapy every 4 weeks. I plan to repeat imaging study again in March 2020  Post-nasal drip She has cough with postnasal drip I recommend Flonase I recommend her to stop smoking  Cancer associated pain She has multiple chronic joint pain and is dependent on tramadol to control her joint pain She is taking vitamin D supplement as prescribed I plan to recheck vitamin D level in her next visit   Orders Placed This Encounter  Procedures  . CT CHEST W CONTRAST    Standing Status:   Future    Standing Expiration Date:   05/18/2019    Order Specific Question:   If indicated for the ordered procedure, I authorize the administration of contrast media per Radiology protocol    Answer:   Yes    Order Specific Question:   Preferred imaging location?    Answer:   [facility]    Order Specific Question:   Radiology Contrast Protocol - do NOT remove file path    Answer:   _0 [email or web address]    Order Specific Question:   Is patient pregnant?    Answer:   No  . CT ABDOMEN PELVIS W CONTRAST    Standing  Status:   Future    Standing Expiration Date:   05/18/2019    Order Specific Question:   If indicated for the ordered procedure, I authorize the administration of contrast media per Radiology protocol    Answer:   Yes    Order Specific Question:   Preferred imaging location?    Answer:   [facility]    Order Specific Question:   Radiology Contrast Protocol - do NOT remove file path    Answer:   _1 [email or web address]    Order Specific Question:   Is patient pregnant?    Answer:   No  . VITAMIN D 25 Hydroxy (Vit-D Deficiency, Fractures)    Standing Status:   Future    Standing Expiration Date:   11/15/2019    INTERVAL HISTORY: Please see below for problem oriented charting. She returns for further follow-up and chemotherapy She feels well except for some postnasal drip and cough She denies fever or chills No sensation of sore throat She continues to smoke cigar No new lymphadenopathy Her chronic joint pain is stable.  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    05/07/2014 Imaging    CT scan of the chest show right thoracic inlet lymphadenopathy    06/07/2014 Procedure    She underwent ultrasound-guided core biopsy of the neck  lymph node    06/07/2014 Pathology Results    Accession: [id number] biopsy confirmed diagnosis of Hodgkin lymphoma.    06/15/2014 Imaging    Echocardiogram showed preserved ejection fraction    07/09/2014 - 07/12/2014 Hospital Admission    She was admitted to the hospital for severe anemia.    07/27/2014 Procedure    She had placement of port    07/31/2014 - 09/11/2014 Chemotherapy    She received dose adjusted chemotherapy due to abnormal liver function tests and severe anemia. Treatment was delayed due to noncompliance  and subsequently stopped because the  patient failed to keep appointments    01/11/2015 Imaging    Repeat PET CT scan showed response to treatment    01/28/2015 - 06/18/2015 Chemotherapy    ABVD was restarted with full dose.    02/08/2015 - 02/10/2015 Hospital Admission    The patient was admitted to the hospital due to pancytopenia and profuse diarrhea. Cultures were negative. She was placed on ciprofloxacin.    02/11/2015 Adverse Reaction    Treatment was placed on hold due to recent infection.    04/11/2015 Imaging    PET CT scan showed near complete response. Incidental finding of an abnormal bone lesion, indeterminate. She is not symptomatic. Recommendation from Hem [facility] to observe    07/11/2015 Imaging    PET CT scan showed abnormal new bone lesions, suggestive of possible disease progression    07/23/2015 Bone Marrow Biopsy    She underwent bone biopsy    07/23/2015 Pathology Results    Accession: [id number]  biopsy was negative for cancer    11/21/2015 Surgery    She had surgery for ectopic pregnancy    01/29/2016 Imaging    Ct chest, abdomen and pelvis showed pelvic and retroperitoneal lymphadenopathy, as above, concerning for residual disease. There is also a mildly enlarged posterior mediastinal lymph node measuring 11 mm adjacent to the distal descending thoracic aorta. This may represent an additional focus of disease, but is the only finding of concern in the thorax on today's examination. Sclerosis in the right ilium at site of previously noted metabolically active lesion, grossly unchanged. No other definite osseous lesions are identified on today's examination. Spleen is normal in size and appearance.    02/14/2016 PET scan    Interval disease worsening with new foci of hypermetabolic activity in multiple retroperitoneal and pelvic lymph nodes as well as AP window and left hilar lymph nodes. (Deauville 5). There is also overall worsening of the osseous disease.    05/11/2016 Pathology Results    Diagnosis Lymph  node, needle/core biopsy, Left para-aortic retroperitoneal - CLASSICAL HODGKIN LYMPHOMA. - SEE ONCOLOGY TABLE. Microscopic Comment LYMPHOMA Histologic type: Classical Hodgkin lymphoma. Grade (if applicable): N/A Flow cytometry: Not done. Immunohistochemical stains: CD15, CD20, CD3, LCA, PAX-5, CD30 with appropriate controls. Touch preps/imprints: Not performed. Comments: The sections show small needle core biopsy fragments displaying a polymorphous cellular proliferation of small lymphocytes, plasma cells, eosinophils, and large atypical mononuclear and multilobated lymphoid cells with features of Reed-Sternberg cells and variants. This is associated with patchy areas of fibrosis. Immunohistochemical stains were performed and show that the large atypical lymphoid cells are positive for CD30, CD15 and PAX-5 and negative for LCA, CD20, CD3. The small lymphoid cells in the background show a mixture of T and B cells with predominance of T cells. The overall morphologic and histologic features are consistent with classical Hodgkin lymphoma. Further subtyping is challenging in limited small biopsy fragments but the patchy  fibrosis suggests nodular sclerosis type.     05/11/2016 Procedure    She underwent CT guided biopsy of retroperitoneal lymph node    05/26/2016 Procedure    Successful placement of a right IJ approach Power Port with ultrasound and fluoroscopic guidance. The catheter is ready for use.    06/02/2016 PET scan    Mixed response to chemotherapy with some lymph nodes decreased in metabolic activity and some lymph nodes increase metabolic activity. Lymph node stations including mediastinum, periaortic retroperitoneum, and obturator node stations. Activity is remains relatively intense Deauville 4 2. LEFT infrahilar nodule / lymph node with intense metabolic activity decreased from prior. ( Deauville 4 ). 3. 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 Deauville 5     06/03/2016 - 06/05/2016 Hospital Admission    She was admitted to the hospital for cycle 1 of ICE chemotherapy    06/24/2016 - 06/26/2016 Hospital Admission    She received cycle 2 of ICE chemo    07/07/2016 PET scan    Resolution of prior hypermetabolic adenopathy and resolution of prior osseous foci of hypermetabolic activity compatible with essentially complete response to therapy (Deauville 1). 2. Generalized reduced activity in the L4 vertebral body and the type of finding which would typically reflect prior radiation therapy. 3. Stable septated fatty right pelvic lesion, possibly a dermoid or lipoma, not hypermetabolic    0/01/9322 PET scan    Hypermetabolic lesion along the L3 vertebral body and left posterior elements, max SUV 7.8 (Deauville 5). Hypermetabolic lesion along the left inferior pubic ramus, max SUV 4.9 (Deauville 4).  IMPRESSION: Prevascular lymphadenopathy, reflecting nodal recurrence (Deauville 4). Hypermetabolic osseous metastases involving the L3 vertebral body/posterior elements and left inferior pubic ramus (Deauville 4-5). Hypermetabolism along the endometrium, new, possibly reactive/physiologic. Consider pelvic ultrasound and/or endometrial sampling as clinically warranted.    11/04/2016 - 02/24/2017 Chemotherapy    She received Brentuximab    12/11/2016 PET scan    1. Mixed response to therapy within the skeleton. Lesions at L3 is decreased in size and metabolic activity. Residual activity remains above liver ( Deauville 4) 2. Increased activity in the LEFT sacrum with metabolic activity above liver activity ( Deauville 4 3. Decrease in size and metabolic activity of anterior mediastinal tissue consistent with resolution of thymic hyperplasia or resolution of lymphoma metabolic activity ( Deauville 2). 4. No new lymphadenopathy. Normal spleen and liver.    03/25/2017 PET scan    1. Increased metabolic activity in several normal sized retroperitoneal and right pelvic  lymph nodes, Deauville 4. 2. Increase in size and metabolic activity of lesions in the L3 and L4 vertebral bodies and in the left sacrum, Deauville 5. 3. No recurrence of anterior mediastinal abnormal activity. 4. Other imaging findings of potential clinical significance: Chronic ethmoid and left maxillary sinusitis. Paraseptal emphysema at the lung apices. Right ovarian dermoid.    04/06/2017 -  Chemotherapy    The patient had Keytruda    06/09/2017 PET scan    1. Overall significant improvement with reduction in nodal activity. Several retroperitoneal nodes which were previously Deauville 4 are currently Deauville 3. The right common iliac lymph node remains at Deauville 4 with maximum SUV of 3.4, but is improved from prior SUV of 4.3. 2. The bony lesions show the greatest improvement, with the previously Deauville 5 hypermetabolic lesions currently no longer of higher metabolic activity than surrounding bone marrow, currently measuring at Deauville 3. 3. 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 4. No new lesions are identified. 5. Emphysema (ICD10-J43.9).    08/05/2017 Imaging    1. Somewhat complex left adnexal lesion, potentially hemorrhagic cyst measuring 2.9 x 2.8 cm. The possibility of torsion in the left ovary must be considered. Advise correlation with pelvic ultrasound including Doppler assessment to further evaluate.  2.  Right ovarian dermoid, unchanged from recent PET-CT examination.  3. No adenopathy by size criteria evident. Appearance of subcentimeter mesenteric lymph nodes is stable compared to recent study.  4. No bone lesions appreciable by CT. Areas of abnormal radiotracer uptake in the mid lumbar spine noted on recent PET-CT examination. No destruction 2 or lytic change noted in the lumbar vertebrae currently there.  5. No bowel obstruction. No abscess. No periappendiceal region inflammation.  6.  Small hiatal hernia with  fluid in distal esophagus.  7.  Foci of coronary artery calcification, advanced for age.  8.  Spleen normal in size and contour.  9. Small hiatal hernia with mild fluid in the distal esophagus, likely indicative of a degree of reflux.    01/04/2018 Imaging    1. No findings to suggest residual/recurrent lymphoma in the chest, abdomen or pelvis. 2. Right ovarian dermoid slightly larger than prior examinations, currently measuring 3.8 x 2.8 x 3.1 cm. 3. Additional incidental findings, as above.     REVIEW OF SYSTEMS:   Constitutional: Denies fevers, chills or abnormal weight loss Eyes: Denies blurriness of vision Ears, nose, mouth, throat, and face: Denies mucositis or sore throat Cardiovascular: Denies palpitation, chest discomfort or lower extremity swelling Gastrointestinal:  Denies nausea, heartburn or change in bowel habits Skin: Denies abnormal skin rashes Lymphatics: Denies new lymphadenopathy or easy bruising Neurological:Denies numbness, tingling or new weaknesses Behavioral/Psych: Mood is stable, no new changes  All other systems were reviewed with the patient and are negative.  I have reviewed the past medical history, past surgical history, social history and family history with the patient and they are unchanged from previous note.  ALLERGIES:  is allergic to temazepam.  MEDICATIONS:  Current Outpatient Medications  Medication Sig Dispense Refill  . atazanavir (REYATAZ) 300 MG capsule TAKE ONE CAPSULE BY MOUTH DAILY WITH BREAKFAST. TAKE WITH NORVIR 90 capsule 1  . cholecalciferol (VITAMIN D) 1000 units tablet Take 1 tablet (1,000 Units total) by mouth daily. 30 tablet 9  . emtricitabine-tenofovir (TRUVADA) 200-300 MG tablet Take 1 tablet by mouth daily. 90 tablet 1  . fluticasone (FLONASE) 50 MCG/ACT nasal spray Place 1 spray into both nostrils daily. 16 g 0  . ondansetron (ZOFRAN ODT) 8 MG disintegrating tablet Take 1 tablet (8 mg total) by mouth every 8 (eight)  hours as needed for nausea. (Patient not taking: Reported on 05/06/2018) 20 tablet 0  . ritonavir (NORVIR) 100 MG TABS tablet TAKE 1 TABLET BY MOUTH EVERY MORNING WITH BREAKFAST. TAKE WITH REYATAZ 90 tablet 1  . traMADol (ULTRAM) 50 MG tablet Take 2 tablets (100 mg total) by mouth every 6 (six) hours as needed for moderate pain. 90 tablet 0   No current facility-administered medications for this visit.     PHYSICAL EXAMINATION: ECOG PERFORMANCE STATUS: 1 - Symptomatic but completely ambulatory  Vitals:   05/17/18 0820  BP: 111/77  Pulse: 75  Resp: 18  Temp: 98.4 F (36.9 C)  SpO2: 100%   Filed Weights   05/17/18 0820  Weight: 107 lb (48.5 kg)    GENERAL:alert, no distress and comfortable SKIN: skin color, texture, turgor are normal, no rashes  or significant lesions EYES: normal, Conjunctiva are pink and non-injected, sclera clear OROPHARYNX:no exudate, no erythema and lips, buccal mucosa, and tongue normal  NECK: supple, thyroid normal size, non-tender, without nodularity LYMPH:  no palpable lymphadenopathy in the cervical, axillary or inguinal LUNGS: clear to auscultation and percussion with normal breathing effort HEART: regular rate & rhythm and no murmurs and no lower extremity edema ABDOMEN:abdomen soft, non-tender and normal bowel sounds Musculoskeletal:no cyanosis of digits and no clubbing  NEURO: alert & oriented x 3 with fluent speech, no focal motor/sensory deficits  LABORATORY DATA:  I have reviewed the data as listed    Component Value Date/Time   NA 140 05/06/2018 0308   NA 140 04/29/2017 0745   K 3.3 (L) 05/06/2018 0308   K 3.3 (L) 04/29/2017 0745   CL 107 05/06/2018 0308   CO2 25 05/06/2018 0308   CO2 23 04/29/2017 0745   GLUCOSE 111 (H) 05/06/2018 0308   GLUCOSE 83 04/29/2017 0745   BUN 15 05/06/2018 0308   BUN 10.7 04/29/2017 0745   CREATININE 0.97 05/06/2018 0308   CREATININE 1.2 (H) 04/29/2017 0745   CALCIUM 9.0 05/06/2018 0308   CALCIUM 9.8  04/29/2017 0745   PROT 7.4 05/06/2018 0308   PROT 7.6 04/29/2017 0745   ALBUMIN 4.3 05/06/2018 0308   ALBUMIN 4.0 04/29/2017 0745   AST 22 05/06/2018 0308   AST 19 04/29/2017 0745   ALT 16 05/06/2018 0308   ALT 19 04/29/2017 0745   ALKPHOS 93 05/06/2018 0308   ALKPHOS 81 04/29/2017 0745   BILITOT 1.2 05/06/2018 0308   BILITOT 1.31 (H) 04/29/2017 0745   GFRNONAA >60 05/06/2018 0308   GFRNONAA 83 02/20/2016 0908   GFRAA >60 05/06/2018 0308   GFRAA >89 02/20/2016 0908    No results found for: SPEP, UPEP  Lab Results  Component Value Date   WBC 5.6 05/17/2018   NEUTROABS 2.8 05/17/2018   HGB 13.7 05/17/2018   HCT 39.3 05/17/2018   MCV 96.6 05/17/2018   PLT 254 05/17/2018      Chemistry      Component Value Date/Time   NA 140 05/06/2018 0308   NA 140 04/29/2017 0745   K 3.3 (L) 05/06/2018 0308   K 3.3 (L) 04/29/2017 0745   CL 107 05/06/2018 0308   CO2 25 05/06/2018 0308   CO2 23 04/29/2017 0745   BUN 15 05/06/2018 0308   BUN 10.7 04/29/2017 0745   CREATININE 0.97 05/06/2018 0308   CREATININE 1.2 (H) 04/29/2017 0745      Component Value Date/Time   CALCIUM 9.0 05/06/2018 0308   CALCIUM 9.8 04/29/2017 0745   ALKPHOS 93 05/06/2018 0308   ALKPHOS 81 04/29/2017 0745   AST 22 05/06/2018 0308   AST 19 04/29/2017 0745   ALT 16 05/06/2018 0308   ALT 19 04/29/2017 0745   BILITOT 1.2 05/06/2018 0308   BILITOT 1.31 (H) 04/29/2017 0745       All questions were answered. The patient knows to call the clinic with any problems, questions or concerns. No barriers to learning was detected.  I spent 15 minutes counseling the patient face to face. The total time spent in the appointment was 20 minutes and more than 50% was on counseling and review of test results  Heath Lark, MD 05/17/2018 9:04 AM

## 2018-05-17 NOTE — Patient Instructions (Signed)
[facility] Discharge Instructions for Patients Receiving Chemotherapy  Today you received the following chemotherapy agents:  pembrolizumab (Keytruda) and lupron.  To help prevent nausea and vomiting after your treatment, we encourage you to take your nausea medication as directed.   If you develop nausea and vomiting that is not controlled by your nausea medication, call the clinic.   BELOW ARE SYMPTOMS THAT SHOULD BE REPORTED IMMEDIATELY:  *FEVER GREATER THAN 100.5 F  *CHILLS WITH OR WITHOUT FEVER  NAUSEA AND VOMITING THAT IS NOT CONTROLLED WITH YOUR NAUSEA MEDICATION  *UNUSUAL SHORTNESS OF BREATH  *UNUSUAL BRUISING OR BLEEDING  TENDERNESS IN MOUTH AND THROAT WITH OR WITHOUT PRESENCE OF ULCERS  *URINARY PROBLEMS  *BOWEL PROBLEMS  UNUSUAL RASH Items with * indicate a potential emergency and should be followed up as soon as possible.  Feel free to call the clinic should you have any questions or concerns. The clinic phone number is [phone].  Please show the [place] at check-in to the Emergency Department and triage nurse.

## 2018-05-23 ENCOUNTER — Telehealth: Payer: Self-pay | Admitting: *Deleted

## 2018-05-23 ENCOUNTER — Other Ambulatory Visit: Payer: Self-pay | Admitting: Hematology and Oncology

## 2018-05-23 DIAGNOSIS — B20 Human immunodeficiency virus [HIV] disease: Secondary | ICD-10-CM

## 2018-05-23 MED ORDER — TRAMADOL HCL 50 MG PO TABS: 100.0000 mg | ORAL_TABLET | Freq: Four times a day (QID) | ORAL | 0 refills | Status: DC | PRN

## 2018-05-23 NOTE — Telephone Encounter (Signed)
Received vm message from patient requesting refill on her tramadol.  This was done per Dr. Alvy Bimler.

## 2018-05-24 ENCOUNTER — Other Ambulatory Visit: Payer: Self-pay | Admitting: Internal Medicine

## 2018-05-24 ENCOUNTER — Other Ambulatory Visit: Payer: Self-pay

## 2018-05-24 DIAGNOSIS — B20 Human immunodeficiency virus [HIV] disease: Secondary | ICD-10-CM

## 2018-05-24 MED ORDER — EMTRICITABINE-TENOFOVIR DF 200-300 MG PO TABS: 1.0000 | ORAL_TABLET | Freq: Every day | ORAL | 1 refills | Status: DC

## 2018-05-24 MED ORDER — RITONAVIR 100 MG PO TABS: ORAL_TABLET | ORAL | 1 refills | Status: DC

## 2018-05-24 MED ORDER — ATAZANAVIR SULFATE 300 MG PO CAPS: ORAL_CAPSULE | ORAL | 1 refills | Status: DC

## 2018-05-31 ENCOUNTER — Other Ambulatory Visit: Payer: Self-pay

## 2018-05-31 ENCOUNTER — Ambulatory Visit (INDEPENDENT_AMBULATORY_CARE_PROVIDER_SITE_OTHER): Payer: 59 | Admitting: Internal Medicine

## 2018-05-31 ENCOUNTER — Telehealth: Payer: Self-pay

## 2018-05-31 ENCOUNTER — Telehealth: Payer: Self-pay | Admitting: Behavioral Health

## 2018-05-31 ENCOUNTER — Other Ambulatory Visit (HOSPITAL_COMMUNITY)
Admission: RE | Admit: 2018-05-31 | Discharge: 2018-05-31 | Disposition: A | Discharge status: Home / Self Care | Payer: 59 | Source: Ambulatory Visit | Attending: Internal Medicine | Admitting: Internal Medicine

## 2018-05-31 ENCOUNTER — Encounter: Payer: Self-pay | Admitting: Internal Medicine

## 2018-05-31 VITALS — BP 106/73 | HR 69 | Temp 97.9°F | Wt 107.0 lb

## 2018-05-31 DIAGNOSIS — A539 Syphilis, unspecified: Secondary | ICD-10-CM

## 2018-05-31 DIAGNOSIS — R21 Rash and other nonspecific skin eruption: Secondary | ICD-10-CM

## 2018-05-31 DIAGNOSIS — B20 Human immunodeficiency virus [HIV] disease: Secondary | ICD-10-CM | POA: Diagnosis present

## 2018-05-31 DIAGNOSIS — K649 Unspecified hemorrhoids: Secondary | ICD-10-CM

## 2018-05-31 DIAGNOSIS — Z113 Encounter for screening for infections with a predominantly sexual mode of transmission: Secondary | ICD-10-CM

## 2018-05-31 DIAGNOSIS — Z23 Encounter for immunization: Secondary | ICD-10-CM | POA: Insufficient documentation

## 2018-05-31 MED ORDER — HYDROCORTISONE ACETATE 25 MG RE SUPP: 25.0000 mg | Freq: Two times a day (BID) | RECTAL | 0 refills | Status: DC

## 2018-05-31 MED ORDER — CLOTRIMAZOLE 1 % EX OINT: 1.0000 "application " | TOPICAL_OINTMENT | Freq: Two times a day (BID) | CUTANEOUS | 1 refills | Status: DC

## 2018-05-31 MED ORDER — DEXAMETHASONE 2 MG PO TABS: 2.0000 mg | ORAL_TABLET | Freq: Every day | ORAL | 0 refills | Status: DC

## 2018-05-31 NOTE — Addendum Note (Signed)
Addended by: Thayer Headings on: 05/31/2018 02:45 PM   Modules accepted: Orders

## 2018-05-31 NOTE — Assessment & Plan Note (Signed)
Will screen today 

## 2018-05-31 NOTE — Telephone Encounter (Signed)
She called and left a message. She has misplaced her Tramadol and cannot find it. She is due for refill on 2/9. Ask if Dr. Alvy Bimler can send Rx to pharmacy early or send a enough pills to last until Thursday.

## 2018-05-31 NOTE — Telephone Encounter (Signed)
Called and given below message. She verbalized understanding. Rx sent to pharmacy. °

## 2018-05-31 NOTE — Progress Notes (Signed)
   Subjective:    Patient ID: [name], female    DOB: [birth year], 33 y.o.   MRN: [id number]  HPI Here for follow up of HIV Continues on Reyataz, norvir, Truvada and no missed doses.  She has been hesitant to change and with other mediations I have kept her on this regimen. No labs prior to today's visit.  Continues to see oncology, Dr. Alvy Bimler, for her  Hodgkin lymphoma and getting chemo once a month. Weight up and down.  Complaint of a rash on inner thighs and referred to dermatology but with medicaid can't be seen.     Review of Systems  Constitutional: Negative for fatigue and fever.  Neurological: Negative for dizziness.       Objective:   Physical Exam  Constitutional: She appears well-developed and well-nourished. No distress.  HENT:  Mouth/Throat: No oropharyngeal exudate.  Eyes: No scleral icterus.  Cardiovascular: Normal rate, regular rhythm and normal heart sounds.  No murmur heard. Pulmonary/Chest: Effort normal and breath sounds normal. No respiratory distress.  Lymphadenopathy:    She has no cervical adenopathy.  Skin: Rash noted.   SH: still occasional tobacco       Assessment & Plan:

## 2018-05-31 NOTE — Assessment & Plan Note (Signed)
Has tried anti inflammatory, will try antifungal ointment

## 2018-05-31 NOTE — Telephone Encounter (Signed)
Called and left a message asking to to call the office.

## 2018-05-31 NOTE — Telephone Encounter (Signed)
No, unfortunately she will not be able to get refill sooner We can give her low dose dex 2mg  for a few days if she is interested to try May help with bone pain

## 2018-05-31 NOTE — Assessment & Plan Note (Signed)
Doing well.  Labs today and rtc 4 months.

## 2018-05-31 NOTE — Assessment & Plan Note (Signed)
Will refill anusol.  May need surgery referral if no improvement.

## 2018-05-31 NOTE — Telephone Encounter (Addendum)
PA initiated for Anucort [facility] 25mg  suppository via phone ([phone]) Case #[id number].  Will send approval or denial via fax within 72 hours. Pricilla Riffle RN

## 2018-05-31 NOTE — Telephone Encounter (Signed)
She called and left a message to call her.  She is requesting a gas card. Message sent to Lauren the social worker asking her to call patient regarding gas card.  She is interested in the transportation program, message sent to [place] to arrange transportation.  She has misplaced her Tramadol Rx. She will look for the Rx and call the office back.

## 2018-05-31 NOTE — Assessment & Plan Note (Signed)
Refuses Prevnar, flu shot.

## 2018-06-01 ENCOUNTER — Other Ambulatory Visit: Payer: Self-pay | Admitting: Internal Medicine

## 2018-06-01 LAB — T-HELPER CELL (CD4) - (RCID CLINIC ONLY)
CD4 % Helper T Cell: 37 % (ref 33–55)
CD4 T Cell Abs: 1050 /uL (ref 400–2700)

## 2018-06-01 LAB — URINE CYTOLOGY ANCILLARY ONLY
Chlamydia: NEGATIVE
Neisseria Gonorrhea: NEGATIVE

## 2018-06-01 MED ORDER — HYDROCORTISONE 1 % RE CREA: 1.0000 "application " | TOPICAL_CREAM | Freq: Every day | RECTAL | 5 refills | Status: DC

## 2018-06-01 NOTE — Telephone Encounter (Signed)
PA for Hydrocortisone suppositories were denied.  Patient's insurance will not pay for suppositories.  They will only pay for creams.   Pricilla Riffle RN

## 2018-06-01 NOTE — Telephone Encounter (Signed)
Cream has been sent

## 2018-06-02 ENCOUNTER — Telehealth: Payer: Self-pay

## 2018-06-02 ENCOUNTER — Other Ambulatory Visit: Payer: Self-pay | Admitting: Hematology and Oncology

## 2018-06-02 DIAGNOSIS — B20 Human immunodeficiency virus [HIV] disease: Secondary | ICD-10-CM

## 2018-06-02 LAB — HIV-1 RNA QUANT-NO REFLEX-BLD
HIV 1 RNA Quant: <20 copies/mL — AB
HIV-1 RNA Quant, Log: <1.3 Log copies/mL — AB

## 2018-06-02 LAB — RPR: RPR: NONREACTIVE

## 2018-06-02 MED ORDER — TRAMADOL HCL 50 MG PO TABS: 100.0000 mg | ORAL_TABLET | Freq: Four times a day (QID) | ORAL | 0 refills | Status: DC | PRN

## 2018-06-02 NOTE — Telephone Encounter (Signed)
done

## 2018-06-02 NOTE — Telephone Encounter (Signed)
She called requesting Tramadol refill. Ask that it be sent to [facility] on on [address].

## 2018-06-02 NOTE — Telephone Encounter (Signed)
Called and left a message that Rx has been sent to pharmacy.

## 2018-06-03 IMAGING — CT CT BIOPSY
1 of 2 series · 10 of 16 positions shown, 13 images · non-contrast
Comparison: CT of the abdomen and pelvis on 01/29/2016 and PET scan
on 02/14/2016.

CLINICAL DATA: History of treated classical Hodgkin's lymphoma. CT
and PET scan now show evidence of disease recurrence/worsening with
hypermetabolic mediastinal lymphadenopathy in the chest and
retroperitoneal lymphadenopathy in the abdomen and pelvis.
Para-aortic retroperitoneal adenopathy is down targeted for further
tissue sampling.

EXAM:
CT GUIDED CORE BIOPSY OF RETROPERITONEAL ABDOMINAL LYMPH NODE

[Series 2: i-spiral 5.0 b40f · axial · 0.61mm/px · z∈[+698,+950]mm · 10 of 89 slices shown, 13 images]
[im 9/89  soft-tissue]
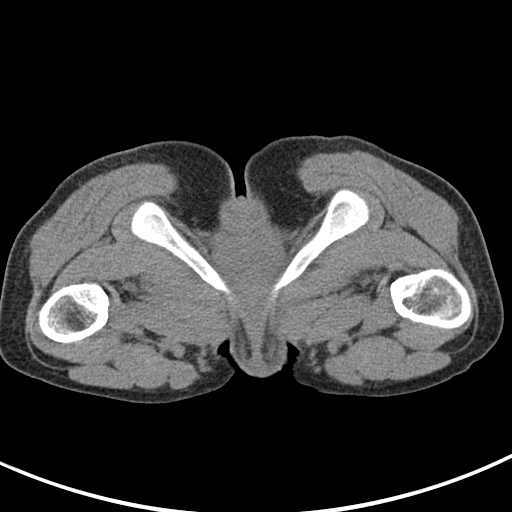
[im 9/89  bone]
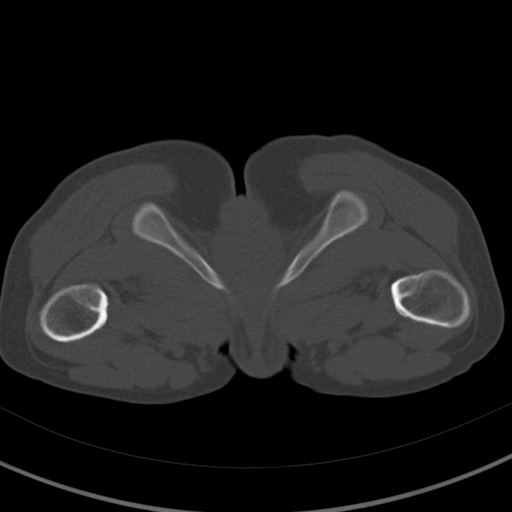
[im 17/89  soft-tissue]
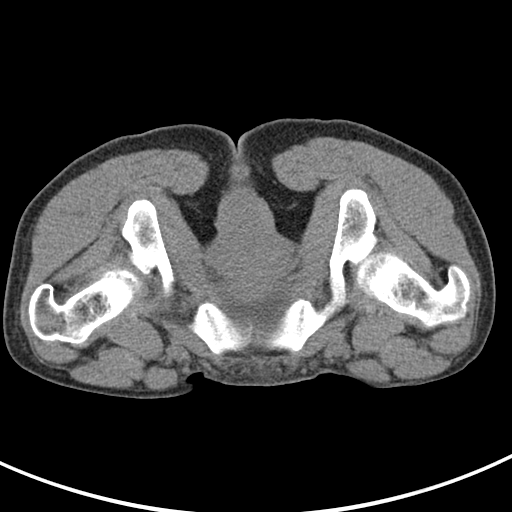
[im 25/89  soft-tissue]
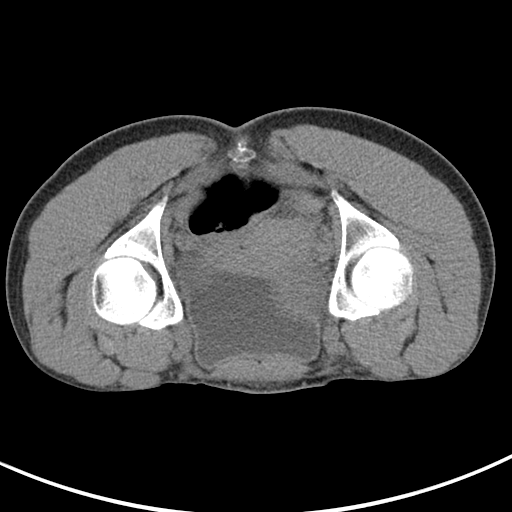
[im 33/89  soft-tissue]
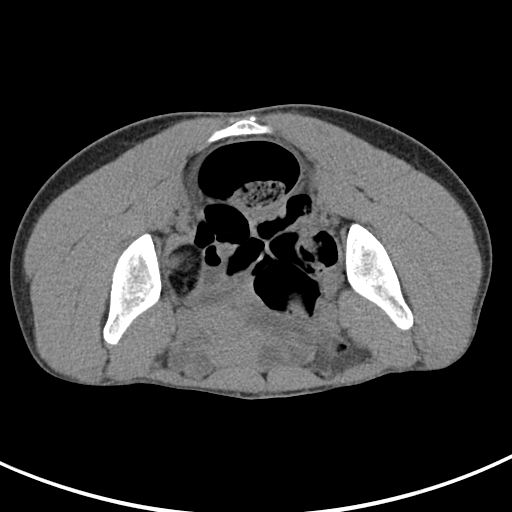
[im 41/89  soft-tissue]
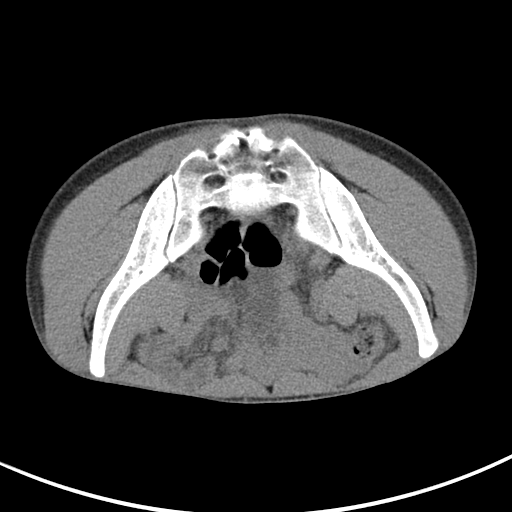
[im 41/89  bone]
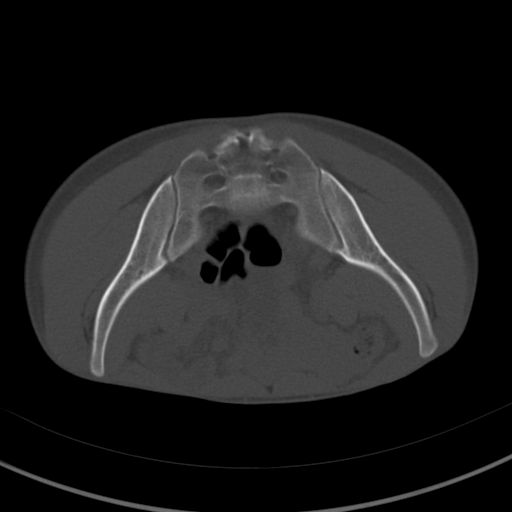
[im 49/89  soft-tissue]
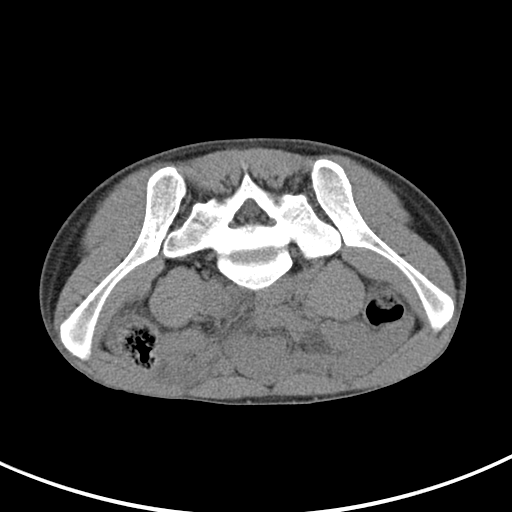
[im 57/89  soft-tissue]
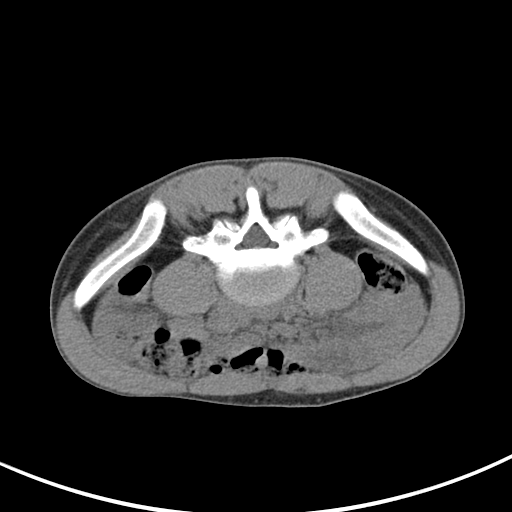
[im 65/89  soft-tissue]
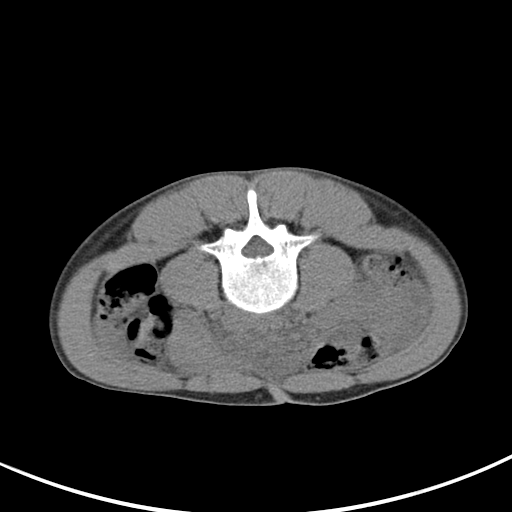
[im 73/89  soft-tissue]
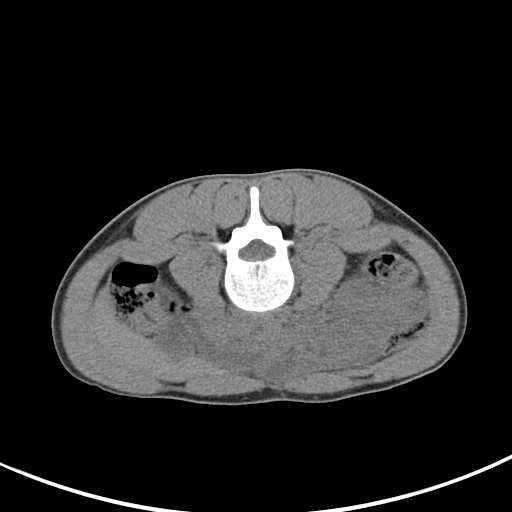
[im 73/89  bone]
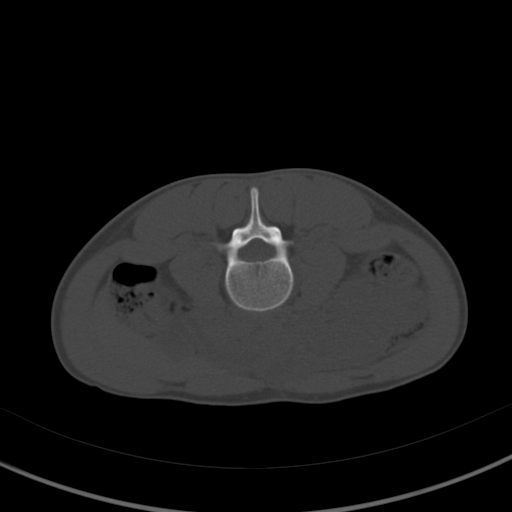
[im 81/89  soft-tissue]
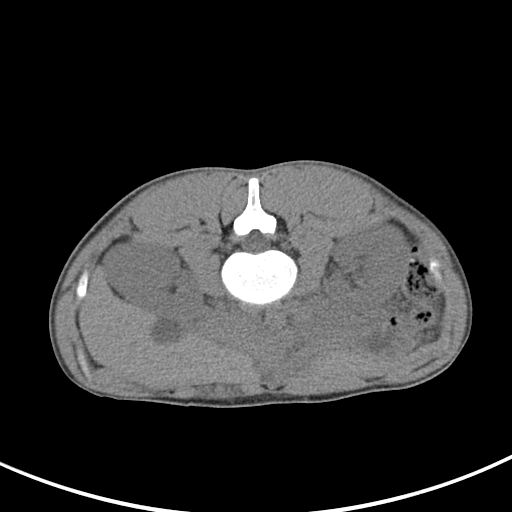

[10 of 16 positions shown; findings below may reference images not displayed]

ANESTHESIA/SEDATION:
2.0 mg IV Versed; 100 mcg IV Fentanyl

Total Moderate Sedation Time:  20 minutes.

The patient's level of consciousness and physiologic status were
continuously monitored during the procedure by Radiology nursing.

PROCEDURE:
The procedure risks, benefits, and alternatives were explained to
the patient. Questions regarding the procedure were encouraged and
answered. The patient understands and consents to the procedure. A
time-out was performed prior to initiating the procedure.

The left posterior translumbar region was prepped with chlorhexidine
in a sterile fashion, and a sterile drape was applied covering the
operative field. A sterile gown and sterile gloves were used for the
procedure. Local anesthesia was provided with 1% Lidocaine.

CT was performed through the abdomen in a prone position. Under CT
guidance, a 17 gauge trocar needle was advanced into the left
retroperitoneum at the level of a a para-aortic lymph node. Core
biopsy was performed with an 18 gauge device. Three separate core
biopsy samples were submitted in saline.

COMPLICATIONS:
None
FINDINGS: The most accessible retroperitoneal lymph node based on most recent
imaging is to the left of the aorta and inferior to the left renal
vein. This lymph node was targeted with solid tissue obtained. There
was no immediate CT evidence of bleeding complication. The procedure
was well tolerated.
IMPRESSION: CT-guided core biopsy performed of left para-aortic lymphadenopathy.

## 2018-06-06 ENCOUNTER — Other Ambulatory Visit: Payer: Self-pay | Admitting: *Deleted

## 2018-06-06 MED ORDER — HYDROCORTISONE 2.5 % EX CREA: TOPICAL_CREAM | Freq: Every day | CUTANEOUS | 5 refills | Status: DC

## 2018-06-07 ENCOUNTER — Telehealth: Payer: Self-pay | Admitting: Hematology and Oncology

## 2018-06-07 NOTE — Telephone Encounter (Signed)
Spoke with patient on the phone and said that she did not need any rides to her appointments. She said she is going to talk to Gwinda Maine to get gas cards.   Gave patient my direct contact information in case she needs help getting rides to appointments in the future.

## 2018-06-10 ENCOUNTER — Telehealth: Payer: Self-pay | Admitting: *Deleted

## 2018-06-10 NOTE — Telephone Encounter (Signed)
Patient called and left a message to have a refill sent in for Tramadol to [facility] on [address]

## 2018-06-10 NOTE — Telephone Encounter (Signed)
Not sure for refill Earliest available is next Monday

## 2018-06-13 ENCOUNTER — Other Ambulatory Visit: Payer: Self-pay | Admitting: Hematology and Oncology

## 2018-06-13 ENCOUNTER — Telehealth: Payer: Self-pay | Admitting: *Deleted

## 2018-06-13 DIAGNOSIS — B20 Human immunodeficiency virus [HIV] disease: Secondary | ICD-10-CM

## 2018-06-13 MED ORDER — TRAMADOL HCL 50 MG PO TABS: 100.0000 mg | ORAL_TABLET | Freq: Four times a day (QID) | ORAL | 0 refills | Status: DC | PRN

## 2018-06-13 NOTE — Telephone Encounter (Signed)
I have already sent it

## 2018-06-13 NOTE — Telephone Encounter (Signed)
done

## 2018-06-13 NOTE — Telephone Encounter (Signed)
Patient called requesting "refill for Tramadol 50 mg be sent to [facility] on [address]."   Request return call to [phone].

## 2018-06-13 NOTE — Telephone Encounter (Signed)
Patient calling to have Tramadol refill sent to [facility].

## 2018-06-14 ENCOUNTER — Inpatient Hospital Stay: Payer: 59 | Attending: Hematology and Oncology

## 2018-06-14 ENCOUNTER — Inpatient Hospital Stay (HOSPITAL_BASED_OUTPATIENT_CLINIC_OR_DEPARTMENT_OTHER): Payer: 59 | Admitting: Hematology and Oncology

## 2018-06-14 ENCOUNTER — Encounter: Payer: Self-pay | Admitting: Hematology and Oncology

## 2018-06-14 ENCOUNTER — Inpatient Hospital Stay: Payer: 59

## 2018-06-14 ENCOUNTER — Inpatient Hospital Stay: Payer: 59 | Admitting: *Deleted

## 2018-06-14 DIAGNOSIS — Z79899 Other long term (current) drug therapy: Secondary | ICD-10-CM | POA: Diagnosis not present

## 2018-06-14 DIAGNOSIS — C8118 Nodular sclerosis classical Hodgkin lymphoma, lymph nodes of multiple sites: Secondary | ICD-10-CM

## 2018-06-14 DIAGNOSIS — G893 Neoplasm related pain (acute) (chronic): Secondary | ICD-10-CM

## 2018-06-14 DIAGNOSIS — B20 Human immunodeficiency virus [HIV] disease: Secondary | ICD-10-CM

## 2018-06-14 DIAGNOSIS — Z95828 Presence of other vascular implants and grafts: Secondary | ICD-10-CM

## 2018-06-14 DIAGNOSIS — C8198 Hodgkin lymphoma, unspecified, lymph nodes of multiple sites: Secondary | ICD-10-CM

## 2018-06-14 DIAGNOSIS — Z5112 Encounter for antineoplastic immunotherapy: Secondary | ICD-10-CM | POA: Insufficient documentation

## 2018-06-14 DIAGNOSIS — Z5111 Encounter for antineoplastic chemotherapy: Secondary | ICD-10-CM | POA: Insufficient documentation

## 2018-06-14 DIAGNOSIS — E559 Vitamin D deficiency, unspecified: Secondary | ICD-10-CM

## 2018-06-14 LAB — CBC WITH DIFFERENTIAL/PLATELET
Abs Immature Granulocytes: 0.01 10*3/uL (ref 0.00–0.07)
Basophils Absolute: 0 10*3/uL (ref 0.0–0.1)
Basophils Relative: 1 %
Eosinophils Absolute: 0.1 10*3/uL (ref 0.0–0.5)
Eosinophils Relative: 2 %
HCT: 40.3 % (ref 36.0–46.0)
Hemoglobin: 13.8 g/dL (ref 12.0–15.0)
IMMATURE GRANULOCYTES: 0 %
Lymphocytes Relative: 41 %
Lymphs Abs: 2.1 10*3/uL (ref 0.7–4.0)
MCH: 32.7 pg (ref 26.0–34.0)
MCHC: 34.2 g/dL (ref 30.0–36.0)
MCV: 95.5 fL (ref 80.0–100.0)
Monocytes Absolute: 0.7 10*3/uL (ref 0.1–1.0)
Monocytes Relative: 13 %
NEUTROS PCT: 43 %
Neutro Abs: 2.3 10*3/uL (ref 1.7–7.7)
Platelets: 217 10*3/uL (ref 150–400)
RBC: 4.22 MIL/uL (ref 3.87–5.11)
RDW: 12.8 % (ref 11.5–15.5)
WBC: 5.3 10*3/uL (ref 4.0–10.5)
nRBC: 0 % (ref 0.0–0.2)

## 2018-06-14 LAB — COMPREHENSIVE METABOLIC PANEL
ALT: 14 U/L (ref 0–44)
AST: 18 U/L (ref 15–41)
Albumin: 4.1 g/dL (ref 3.5–5.0)
Alkaline Phosphatase: 127 U/L — ABNORMAL HIGH (ref 38–126)
Anion gap: 9 (ref 5–15)
BUN: 8 mg/dL (ref 6–20)
CHLORIDE: 105 mmol/L (ref 98–111)
CO2: 25 mmol/L (ref 22–32)
Calcium: 9.9 mg/dL (ref 8.9–10.3)
Creatinine, Ser: 1.02 mg/dL — ABNORMAL HIGH (ref 0.44–1.00)
GFR calc Af Amer: >60 mL/min (ref >60)
GFR calc non Af Amer: >60 mL/min (ref >60)
Glucose, Bld: 89 mg/dL (ref 70–99)
Potassium: 3.7 mmol/L (ref 3.5–5.1)
Sodium: 139 mmol/L (ref 135–145)
Total Bilirubin: 0.8 mg/dL (ref 0.3–1.2)
Total Protein: 7.7 g/dL (ref 6.5–8.1)

## 2018-06-14 MED ORDER — HEPARIN SOD (PORK) LOCK FLUSH 100 UNIT/ML IV SOLN
500.0000 [IU] | Freq: Once | INTRAVENOUS | Status: AC | PRN
  Administered 2018-06-14: 500 [IU]
  Filled 2018-06-14: qty 5

## 2018-06-14 MED ORDER — SODIUM CHLORIDE 0.9% FLUSH
10.0000 mL | INTRAVENOUS | Status: DC | PRN
  Administered 2018-06-14: 10 mL
  Filled 2018-06-14: qty 10

## 2018-06-14 MED ORDER — SODIUM CHLORIDE 0.9 % IV SOLN
Freq: Once | INTRAVENOUS | Status: AC
  Administered 2018-06-14: 10:00:00 via INTRAVENOUS
  Filled 2018-06-14: qty 250

## 2018-06-14 MED ORDER — LEUPROLIDE ACETATE 7.5 MG IM KIT
7.5000 mg | PACK | Freq: Once | INTRAMUSCULAR | Status: AC
  Administered 2018-06-14: 7.5 mg via INTRAMUSCULAR
  Filled 2018-06-14: qty 7.5

## 2018-06-14 MED ORDER — SODIUM CHLORIDE 0.9% FLUSH
10.0000 mL | INTRAVENOUS | Status: DC | PRN
  Administered 2018-06-14: 10 mL via INTRAVENOUS
  Filled 2018-06-14: qty 10

## 2018-06-14 MED ORDER — SODIUM CHLORIDE 0.9 % IV SOLN
200.0000 mg | Freq: Once | INTRAVENOUS | Status: AC
  Administered 2018-06-14: 200 mg via INTRAVENOUS
  Filled 2018-06-14: qty 8

## 2018-06-14 NOTE — Progress Notes (Signed)
[facility] Work  Clinical Social Work met with patient in infusion room to provide support and assess for resources.  [name] reported no major concerns at this time.  She lives alone in an apartment, her goal is to find a new residence in a safe neighborhood.  She is on the wait list for Section 8 housing and CSW has provided information for [employer] to discuss affordable housing options.  Patient's only income is SSI.  She described her mother is her primary support, however, shares support is somewhat lacking at this time. Patient expressed no emotional concerns.  CSW reviewed counseling and supportive services- encouraged patient to follow up as needed.  CSW provided [employer] card to assist with gas costs to treatment.    Gwinda Maine, LCSW  Clinical Social Worker [facility]

## 2018-06-14 NOTE — Assessment & Plan Note (Addendum)
She has multiple chronic joint pain and is dependent on tramadol to control her joint pain She is taking vitamin D supplement as prescribed We discussed refill policy. 

## 2018-06-14 NOTE — Progress Notes (Signed)
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  Due to her prior history of recurrent disease, she is in agreement to go on maintenance therapy every 4 weeks. I plan to repeat imaging study again in March 2020  Human immunodeficiency virus (HIV) disease ([place]) She will continue anti-retroviral treatment as directed She will continue close follow-up with infectious disease service.  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   No orders of the defined types were placed in this encounter.   INTERVAL HISTORY: Please see below for problem oriented charting. She returns for chemotherapy and follow-up She feels well She continues to have chronic joint pain that comes and goes No recent fever or chills She is taking her antiretroviral treatment as directed She has no menstruation. She denies new lymphadenopathy.  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    05/07/2014 Imaging    CT scan of the  chest show right thoracic inlet lymphadenopathy    06/07/2014 Procedure    She underwent ultrasound-guided core biopsy of the neck lymph node    06/07/2014 Pathology Results    Accession: [id number] biopsy confirmed diagnosis of Hodgkin lymphoma.    06/15/2014 Imaging    Echocardiogram showed preserved ejection fraction    07/09/2014 - 07/12/2014 Hospital Admission    She was admitted to the hospital for severe anemia.    07/27/2014 Procedure    She had placement of port    07/31/2014 - 09/11/2014 Chemotherapy    She received dose adjusted chemotherapy due to abnormal liver function tests and severe anemia. Treatment was delayed due to noncompliance  and subsequently stopped because the patient failed to keep appointments    01/11/2015 Imaging    Repeat PET CT scan showed response to treatment    01/28/2015 - 06/18/2015 Chemotherapy    ABVD was restarted with full dose.    02/08/2015 - 02/10/2015 Hospital Admission    The patient was admitted to the hospital due to pancytopenia and profuse diarrhea. Cultures were negative. She was placed on ciprofloxacin.    02/11/2015 Adverse Reaction    Treatment was placed on hold due to recent infection.    04/11/2015 Imaging    PET CT scan showed near complete response. Incidental finding of an abnormal bone lesion, indeterminate. She is not symptomatic. Recommendation from [facility] to observe    07/11/2015 Imaging    PET CT scan showed abnormal new bone lesions, suggestive of possible disease progression    07/23/2015 Bone Marrow Biopsy    She underwent bone biopsy    07/23/2015 Pathology Results    Accession: [id number]  biopsy  was negative for cancer    11/21/2015 Surgery    She had surgery for ectopic pregnancy    01/29/2016 Imaging    Ct chest, abdomen and pelvis showed pelvic and retroperitoneal lymphadenopathy, as above, concerning for residual disease. There is also a mildly enlarged posterior mediastinal lymph node measuring 11 mm adjacent to  the distal descending thoracic aorta. This may represent an additional focus of disease, but is the only finding of concern in the thorax on today's examination. Sclerosis in the right ilium at site of previously noted metabolically active lesion, grossly unchanged. No other definite osseous lesions are identified on today's examination. Spleen is normal in size and appearance.    02/14/2016 PET scan    Interval disease worsening with new foci of hypermetabolic activity in multiple retroperitoneal and pelvic lymph nodes as well as AP window and left hilar lymph nodes. (Deauville 5). There is also overall worsening of the osseous disease.    05/11/2016 Pathology Results    Diagnosis Lymph node, needle/core biopsy, Left para-aortic retroperitoneal - CLASSICAL HODGKIN LYMPHOMA. - SEE ONCOLOGY TABLE. Microscopic Comment LYMPHOMA Histologic type: Classical Hodgkin lymphoma. Grade (if applicable): N/A Flow cytometry: Not done. Immunohistochemical stains: CD15, CD20, CD3, LCA, PAX-5, CD30 with appropriate controls. Touch preps/imprints: Not performed. Comments: The sections show small needle core biopsy fragments displaying a polymorphous cellular proliferation of small lymphocytes, plasma cells, eosinophils, and large atypical mononuclear and multilobated lymphoid cells with features of Reed-Sternberg cells and variants. This is associated with patchy areas of fibrosis. Immunohistochemical stains were performed and show that the large atypical lymphoid cells are positive for CD30, CD15 and PAX-5 and negative for LCA, CD20, CD3. The small lymphoid cells in the background show a mixture of T and B cells with predominance of T cells. The overall morphologic and histologic features are consistent with classical Hodgkin lymphoma. Further subtyping is challenging in limited small biopsy fragments but the patchy fibrosis suggests nodular sclerosis type.     05/11/2016 Procedure    She underwent CT guided  biopsy of retroperitoneal lymph node    05/26/2016 Procedure    Successful placement of a right IJ approach Power Port with ultrasound and fluoroscopic guidance. The catheter is ready for use.    06/02/2016 PET scan    Mixed response to chemotherapy with some lymph nodes decreased in metabolic activity and some lymph nodes increase metabolic activity. Lymph node stations including mediastinum, periaortic retroperitoneum, and obturator node stations. Activity is remains relatively intense Deauville 4 2. LEFT infrahilar nodule / lymph node with intense metabolic activity decreased from prior. ( Deauville 4 ). 3. 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 Deauville 5    06/03/2016 - 06/05/2016 Hospital Admission    She was admitted to the hospital for cycle 1 of ICE chemotherapy    06/24/2016 - 06/26/2016 Hospital Admission    She received cycle 2 of ICE chemo    07/07/2016 PET scan    Resolution of prior hypermetabolic adenopathy and resolution of prior osseous foci of hypermetabolic activity compatible with essentially complete response to therapy (Deauville 1). 2. Generalized reduced activity in the L4 vertebral body and the type of finding which would typically reflect prior radiation therapy. 3. Stable septated fatty right pelvic lesion, possibly a dermoid or lipoma, not hypermetabolic    07/24/5186 PET scan    Hypermetabolic lesion along the L3 vertebral body and left posterior elements, max SUV 7.8 (Deauville 5). Hypermetabolic lesion along the left inferior pubic ramus, max  SUV 4.9 (Deauville 4).  IMPRESSION: Prevascular lymphadenopathy, reflecting nodal recurrence (Deauville 4). Hypermetabolic osseous metastases involving the L3 vertebral body/posterior elements and left inferior pubic ramus (Deauville 4-5). Hypermetabolism along the endometrium, new, possibly reactive/physiologic. Consider pelvic ultrasound and/or endometrial sampling as clinically warranted.     11/04/2016 - 02/24/2017 Chemotherapy    She received Brentuximab    12/11/2016 PET scan    1. Mixed response to therapy within the skeleton. Lesions at L3 is decreased in size and metabolic activity. Residual activity remains above liver ( Deauville 4) 2. Increased activity in the LEFT sacrum with metabolic activity above liver activity ( Deauville 4 3. Decrease in size and metabolic activity of anterior mediastinal tissue consistent with resolution of thymic hyperplasia or resolution of lymphoma metabolic activity ( Deauville 2). 4. No new lymphadenopathy. Normal spleen and liver.    03/25/2017 PET scan    1. Increased metabolic activity in several normal sized retroperitoneal and right pelvic lymph nodes, Deauville 4. 2. Increase in size and metabolic activity of lesions in the L3 and L4 vertebral bodies and in the left sacrum, Deauville 5. 3. No recurrence of anterior mediastinal abnormal activity. 4. Other imaging findings of potential clinical significance: Chronic ethmoid and left maxillary sinusitis. Paraseptal emphysema at the lung apices. Right ovarian dermoid.    04/06/2017 -  Chemotherapy    The patient had Keytruda    06/09/2017 PET scan    1. Overall significant improvement with reduction in nodal activity. Several retroperitoneal nodes which were previously Deauville 4 are currently Deauville 3. The right common iliac lymph node remains at Deauville 4 with maximum SUV of 3.4, but is improved from prior SUV of 4.3. 2. The bony lesions show the greatest improvement, with the previously Deauville 5 hypermetabolic lesions currently no longer of higher metabolic activity than surrounding bone marrow, currently measuring at Deauville 3. 3. 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 4. No new lesions are identified. 5. Emphysema (ICD10-J43.9).    08/05/2017 Imaging    1. Somewhat complex left adnexal lesion, potentially hemorrhagic cyst  measuring 2.9 x 2.8 cm. The possibility of torsion in the left ovary must be considered. Advise correlation with pelvic ultrasound including Doppler assessment to further evaluate.  2.  Right ovarian dermoid, unchanged from recent PET-CT examination.  3. No adenopathy by size criteria evident. Appearance of subcentimeter mesenteric lymph nodes is stable compared to recent study.  4. No bone lesions appreciable by CT. Areas of abnormal radiotracer uptake in the mid lumbar spine noted on recent PET-CT examination. No destruction 2 or lytic change noted in the lumbar vertebrae currently there.  5. No bowel obstruction. No abscess. No periappendiceal region inflammation.  6.  Small hiatal hernia with fluid in distal esophagus.  7.  Foci of coronary artery calcification, advanced for age.  8.  Spleen normal in size and contour.  9. Small hiatal hernia with mild fluid in the distal esophagus, likely indicative of a degree of reflux.    01/04/2018 Imaging    1. No findings to suggest residual/recurrent lymphoma in the chest, abdomen or pelvis. 2. Right ovarian dermoid slightly larger than prior examinations, currently measuring 3.8 x 2.8 x 3.1 cm. 3. Additional incidental findings, as above.     REVIEW OF SYSTEMS:   Constitutional: Denies fevers, chills or abnormal weight loss Eyes: Denies blurriness of vision Ears, nose, mouth, throat, and face: Denies mucositis or sore throat Respiratory: Denies cough, dyspnea or wheezes Cardiovascular: Denies palpitation,  chest discomfort or lower extremity swelling Gastrointestinal:  Denies nausea, heartburn or change in bowel habits Skin: Denies abnormal skin rashes Lymphatics: Denies new lymphadenopathy or easy bruising Neurological:Denies numbness, tingling or new weaknesses Behavioral/Psych: Mood is stable, no new changes  All other systems were reviewed with the patient and are negative.  I have reviewed the past medical history, past  surgical history, social history and family history with the patient and they are unchanged from previous note.  ALLERGIES:  is allergic to temazepam.  MEDICATIONS:  Current Outpatient Medications  Medication Sig Dispense Refill  . atazanavir (REYATAZ) 300 MG capsule TAKE ONE CAPSULE BY MOUTH DAILY WITH BREAKFAST. TAKE WITH NORVIR 90 capsule 1  . cholecalciferol (VITAMIN D) 1000 units tablet Take 1 tablet (1,000 Units total) by mouth daily. 30 tablet 9  . Clotrimazole 1 % OINT Apply 1 application topically 2 (two) times daily. 56.7 g 1  . dexamethasone (DECADRON) 2 MG tablet Take 1 tablet (2 mg total) by mouth daily. 3 tablet 0  . emtricitabine-tenofovir (TRUVADA) 200-300 MG tablet Take 1 tablet by mouth daily. 90 tablet 1  . fluticasone (FLONASE) 50 MCG/ACT nasal spray Place 1 spray into both nostrils daily. 16 g 0  . hydrocortisone (PROCTOCORT) 1 % CREA Apply 1 application topically daily. 28.4 g 5  . hydrocortisone 2.5 % cream Apply topically daily. 28 g 5  . ondansetron (ZOFRAN ODT) 8 MG disintegrating tablet Take 1 tablet (8 mg total) by mouth every 8 (eight) hours as needed for nausea. 20 tablet 0  . ritonavir (NORVIR) 100 MG TABS tablet TAKE 1 TABLET BY MOUTH EVERY MORNING WITH BREAKFAST. TAKE WITH REYATAZ 90 tablet 1  . traMADol (ULTRAM) 50 MG tablet Take 2 tablets (100 mg total) by mouth every 6 (six) hours as needed for moderate pain. 90 tablet 0   No current facility-administered medications for this visit.     PHYSICAL EXAMINATION: ECOG PERFORMANCE STATUS: 1 - Symptomatic but completely ambulatory  Vitals:   06/14/18 0923  BP: 96/63  Pulse: 79  Resp: 18  Temp: 98.4 F (36.9 C)  SpO2: 100%   Filed Weights   06/14/18 0923  Weight: 107 lb (48.5 kg)    GENERAL:alert, no distress and comfortable SKIN: skin color, texture, turgor are normal, no rashes or significant lesions EYES: normal, Conjunctiva are pink and non-injected, sclera clear OROPHARYNX:no exudate, no  erythema and lips, buccal mucosa, and tongue normal  NECK: supple, thyroid normal size, non-tender, without nodularity LYMPH:  no palpable lymphadenopathy in the cervical, axillary or inguinal LUNGS: clear to auscultation and percussion with normal breathing effort HEART: regular rate & rhythm and no murmurs and no lower extremity edema ABDOMEN:abdomen soft, non-tender and normal bowel sounds Musculoskeletal:no cyanosis of digits and no clubbing  NEURO: alert & oriented x 3 with fluent speech, no focal motor/sensory deficits  LABORATORY DATA:  I have reviewed the data as listed    Component Value Date/Time   NA 140 05/17/2018 0800   NA 140 04/29/2017 0745   K 3.9 05/17/2018 0800   K 3.3 (L) 04/29/2017 0745   CL 105 05/17/2018 0800   CO2 24 05/17/2018 0800   CO2 23 04/29/2017 0745   GLUCOSE 79 05/17/2018 0800   GLUCOSE 83 04/29/2017 0745   BUN 10 05/17/2018 0800   BUN 10.7 04/29/2017 0745   CREATININE 1.00 05/17/2018 0800   CREATININE 1.2 (H) 04/29/2017 0745   CALCIUM 10.0 05/17/2018 0800   CALCIUM 9.8 04/29/2017 0745   PROT  7.5 05/17/2018 0800   PROT 7.6 04/29/2017 0745   ALBUMIN 4.2 05/17/2018 0800   ALBUMIN 4.0 04/29/2017 0745   AST 20 05/17/2018 0800   AST 19 04/29/2017 0745   ALT 14 05/17/2018 0800   ALT 19 04/29/2017 0745   ALKPHOS 125 05/17/2018 0800   ALKPHOS 81 04/29/2017 0745   BILITOT 0.6 05/17/2018 0800   BILITOT 1.31 (H) 04/29/2017 0745   GFRNONAA >60 05/17/2018 0800   GFRNONAA 83 02/20/2016 0908   GFRAA >60 05/17/2018 0800   GFRAA >89 02/20/2016 0908    No results found for: SPEP, UPEP  Lab Results  Component Value Date   WBC 5.3 06/14/2018   NEUTROABS 2.3 06/14/2018   HGB 13.8 06/14/2018   HCT 40.3 06/14/2018   MCV 95.5 06/14/2018   PLT 217 06/14/2018      Chemistry      Component Value Date/Time   NA 140 05/17/2018 0800   NA 140 04/29/2017 0745   K 3.9 05/17/2018 0800   K 3.3 (L) 04/29/2017 0745   CL 105 05/17/2018 0800   CO2 24  05/17/2018 0800   CO2 23 04/29/2017 0745   BUN 10 05/17/2018 0800   BUN 10.7 04/29/2017 0745   CREATININE 1.00 05/17/2018 0800   CREATININE 1.2 (H) 04/29/2017 0745      Component Value Date/Time   CALCIUM 10.0 05/17/2018 0800   CALCIUM 9.8 04/29/2017 0745   ALKPHOS 125 05/17/2018 0800   ALKPHOS 81 04/29/2017 0745   AST 20 05/17/2018 0800   AST 19 04/29/2017 0745   ALT 14 05/17/2018 0800   ALT 19 04/29/2017 0745   BILITOT 0.6 05/17/2018 0800   BILITOT 1.31 (H) 04/29/2017 0745       All questions were answered. The patient knows to call the clinic with any problems, questions or concerns. No barriers to learning was detected.  I spent 15 minutes counseling the patient face to face. The total time spent in the appointment was 20 minutes and more than 50% was on counseling and review of test results  [facility] Lark, MD 06/14/2018 9:40 AM

## 2018-06-14 NOTE — Patient Instructions (Signed)
[facility] Discharge Instructions for Patients Receiving Chemotherapy  Today you received the following chemotherapy agents Pembrolizumab (KEYTRUDA).  To help prevent nausea and vomiting after your treatment, we encourage you to take your nausea medication as prescribed.   If you develop nausea and vomiting that is not controlled by your nausea medication, call the clinic.   BELOW ARE SYMPTOMS THAT SHOULD BE REPORTED IMMEDIATELY:  *FEVER GREATER THAN 100.5 F  *CHILLS WITH OR WITHOUT FEVER  NAUSEA AND VOMITING THAT IS NOT CONTROLLED WITH YOUR NAUSEA MEDICATION  *UNUSUAL SHORTNESS OF BREATH  *UNUSUAL BRUISING OR BLEEDING  TENDERNESS IN MOUTH AND THROAT WITH OR WITHOUT PRESENCE OF ULCERS  *URINARY PROBLEMS  *BOWEL PROBLEMS  UNUSUAL RASH Items with * indicate a potential emergency and should be followed up as soon as possible.  Feel free to call the clinic should you have any questions or concerns. The clinic phone number is [phone].  Please show the CHEMO ALERT CARD at check-in to the Emergency Department and triage nurse.   

## 2018-06-14 NOTE — Assessment & Plan Note (Signed)
She has complete response to therapy in her last imaging study Her symptoms of inflammatory arthritis is due to checkpoint inhibitor side effects She has responded well to intermittent low-dose prednisone therapy along with tramadol as needed We discussed the risk and benefits of continuing treatment.  Due to her prior history of recurrent disease, she is in agreement to go on maintenance therapy every 4 weeks. I plan to repeat imaging study again in March 2020

## 2018-06-14 NOTE — Assessment & Plan Note (Signed)
She will continue anti-retroviral treatment as directed She will continue close follow-up with infectious disease service. 

## 2018-06-15 ENCOUNTER — Telehealth: Payer: Self-pay | Admitting: Hematology and Oncology

## 2018-06-15 LAB — TSH: TSH: 3.642 u[IU]/mL (ref 0.308–3.960)

## 2018-06-15 LAB — VITAMIN D 25 HYDROXY (VIT D DEFICIENCY, FRACTURES): Vit D, 25-Hydroxy: 57.1 ng/mL (ref 30.0–100.0)

## 2018-06-15 NOTE — Telephone Encounter (Signed)
No los °

## 2018-06-23 ENCOUNTER — Other Ambulatory Visit: Payer: Self-pay | Admitting: Hematology and Oncology

## 2018-06-23 ENCOUNTER — Telehealth: Payer: Self-pay

## 2018-06-23 DIAGNOSIS — G893 Neoplasm related pain (acute) (chronic): Secondary | ICD-10-CM

## 2018-06-23 DIAGNOSIS — B20 Human immunodeficiency virus [HIV] disease: Secondary | ICD-10-CM

## 2018-06-23 MED ORDER — TRAMADOL HCL 50 MG PO TABS: 100.0000 mg | ORAL_TABLET | Freq: Four times a day (QID) | ORAL | 0 refills | Status: DC | PRN

## 2018-06-23 NOTE — Telephone Encounter (Signed)
She called requesting refill on Tramadol. Ask that Rx be sent to [facility].

## 2018-06-23 NOTE — Telephone Encounter (Signed)
Called per Dr. Alvy Bimler. Rx sent to pharmacy. She is not due for a refill until 2/28. She verbalized understanding.

## 2018-06-23 NOTE — Telephone Encounter (Signed)
Not due for refill until next Monday Tell her to call back next Monday

## 2018-07-01 ENCOUNTER — Telehealth: Payer: Self-pay

## 2018-07-01 NOTE — Telephone Encounter (Signed)
Pt called requesting prescription refill for tramadol.   Last prescription sent to pharmacy on 06/23/18 for 90 pills.  Prescription not due for refill until Monday 07/04/18.  Pt made aware to call back on Monday.

## 2018-07-04 ENCOUNTER — Other Ambulatory Visit: Payer: Self-pay | Admitting: Hematology and Oncology

## 2018-07-04 DIAGNOSIS — B20 Human immunodeficiency virus [HIV] disease: Secondary | ICD-10-CM

## 2018-07-04 DIAGNOSIS — F3289 Other specified depressive episodes: Secondary | ICD-10-CM

## 2018-07-04 DIAGNOSIS — C8198 Hodgkin lymphoma, unspecified, lymph nodes of multiple sites: Secondary | ICD-10-CM

## 2018-07-04 MED ORDER — MIRTAZAPINE 15 MG PO TABS: 15.0000 mg | ORAL_TABLET | Freq: Every day | ORAL | 11 refills | Status: DC

## 2018-07-04 MED ORDER — TRAMADOL HCL 50 MG PO TABS: 100.0000 mg | ORAL_TABLET | Freq: Four times a day (QID) | ORAL | 0 refills | Status: DC | PRN

## 2018-07-08 ENCOUNTER — Telehealth: Payer: Self-pay

## 2018-07-08 ENCOUNTER — Telehealth: Payer: Self-pay | Admitting: Hematology and Oncology

## 2018-07-08 NOTE — Telephone Encounter (Signed)
appt schedule changed per pt request

## 2018-07-08 NOTE — Telephone Encounter (Signed)
Patient came in to reschedule her MD visit and her treatment.  Okay per MD to move the MD visit to the 03/16.

## 2018-07-10 IMAGING — CR DG NECK SOFT TISSUE
3 series · 3 of 3 positions shown · non-contrast
Comparison: PET-CT 06/02/2016

CLINICAL DATA: Left-sided neck swelling under the jaw

EXAM:
NECK SOFT TISSUES - 1+ VIEW

[w soft tissue neck lat]
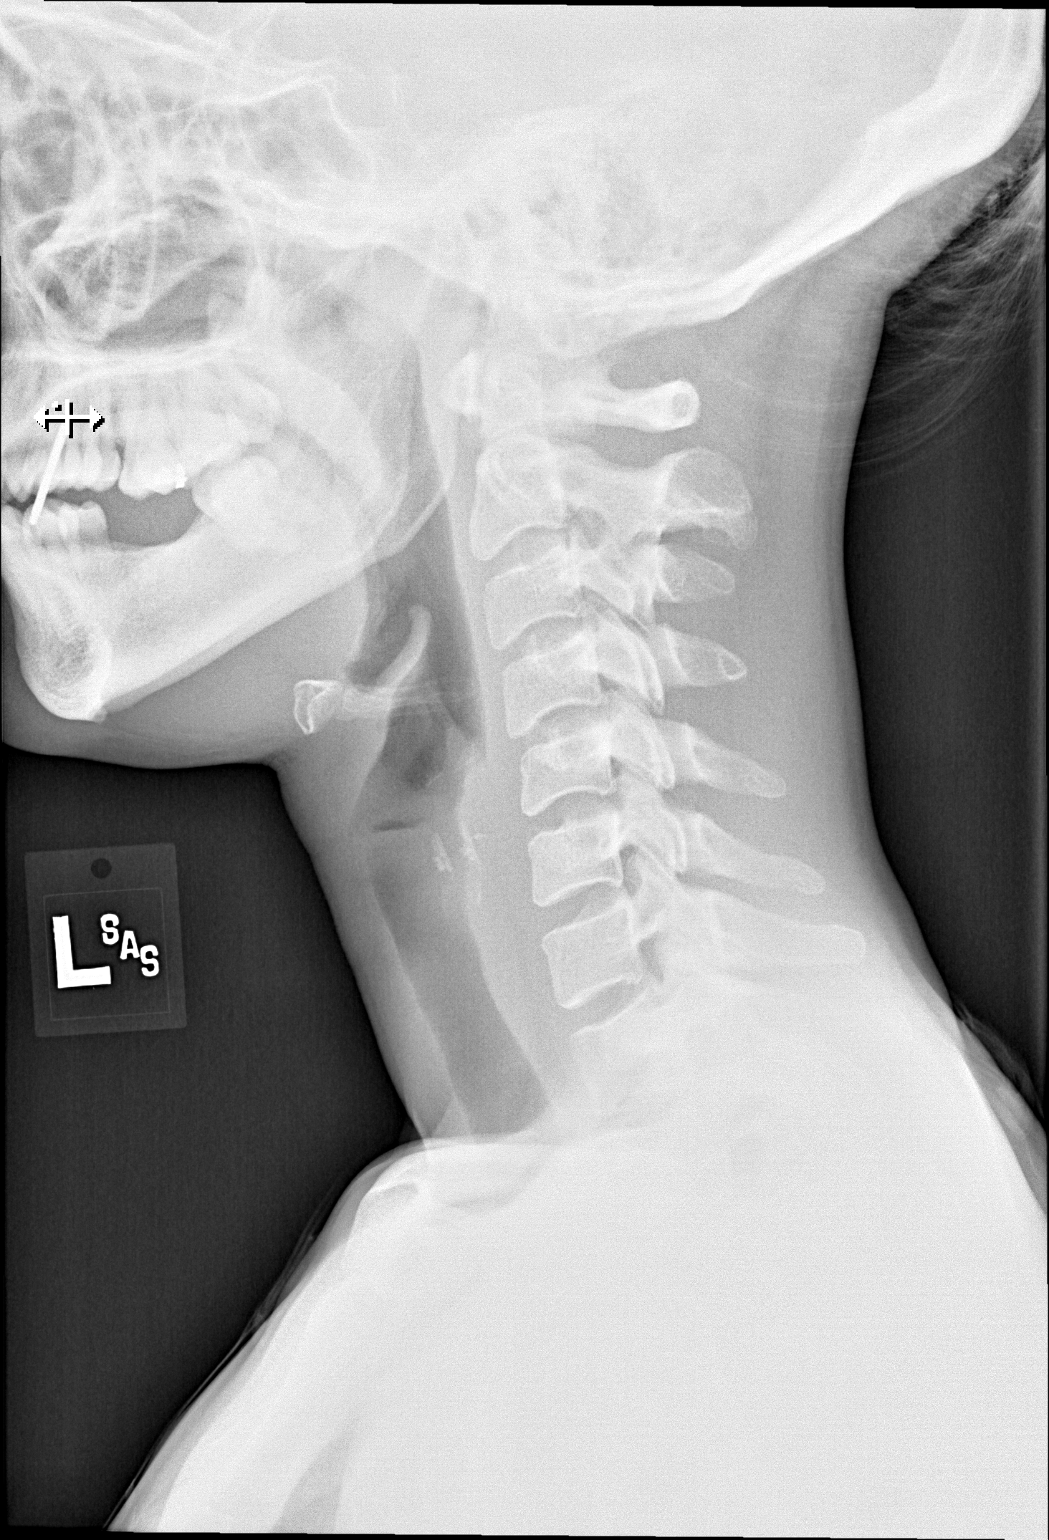

[w soft tissue neck ap (1 of 2)]
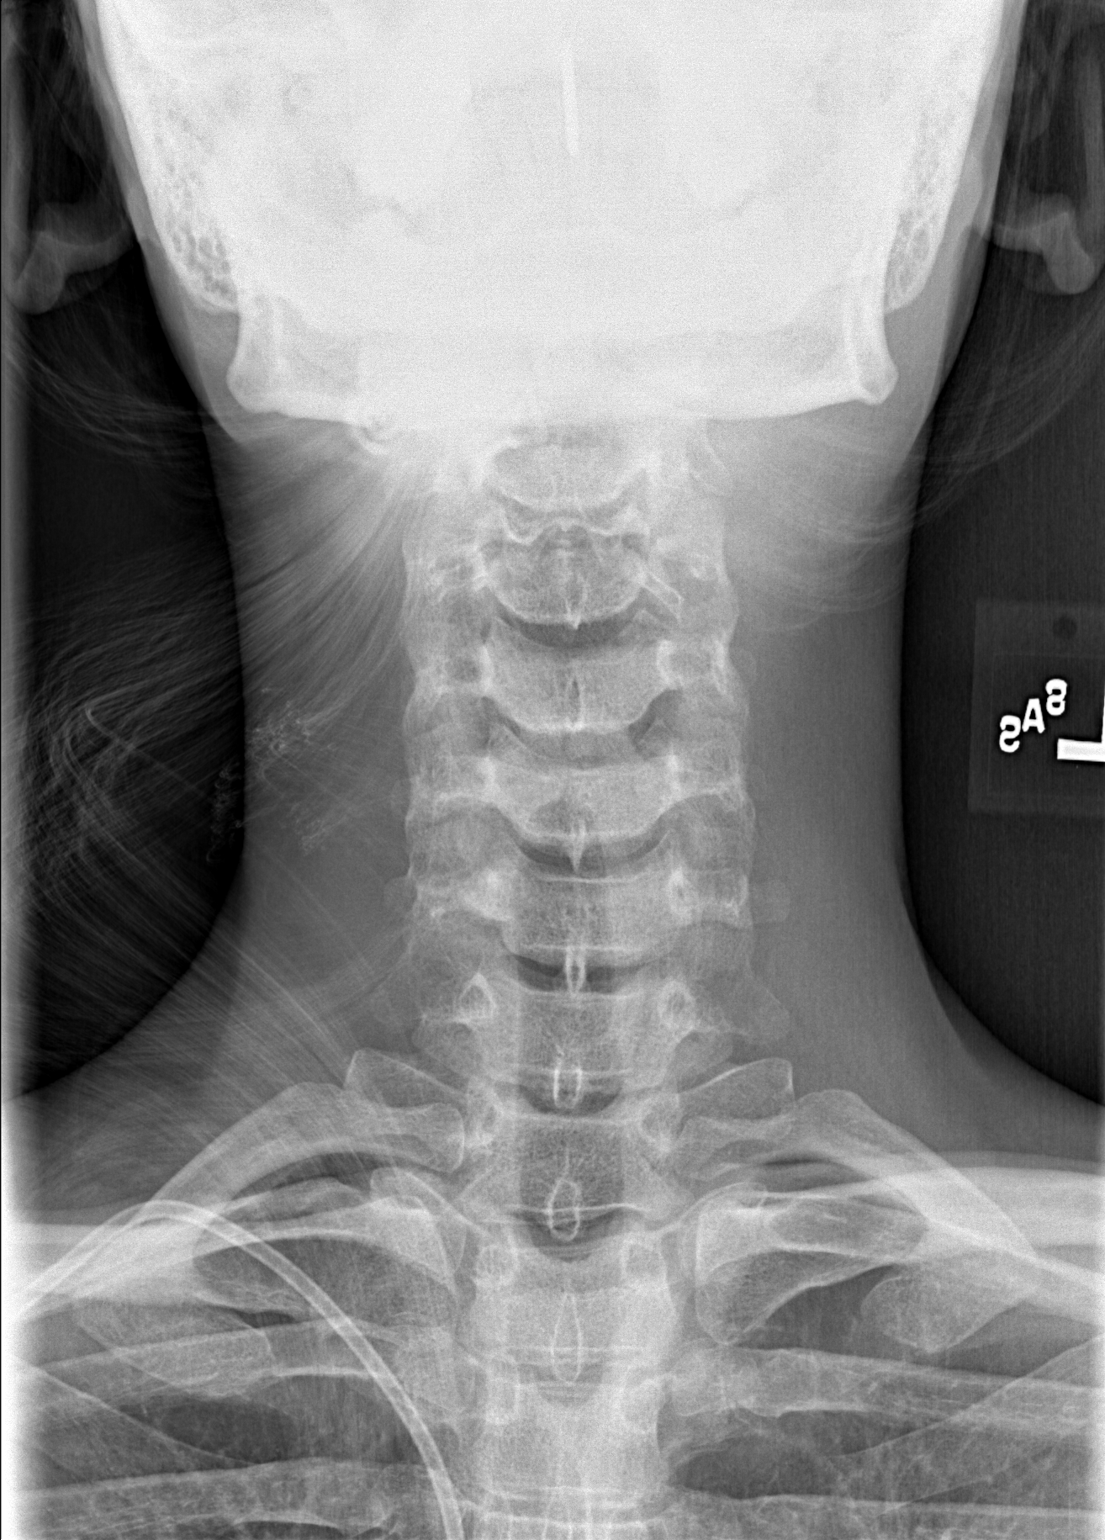

[w soft tissue neck ap (2 of 2)]
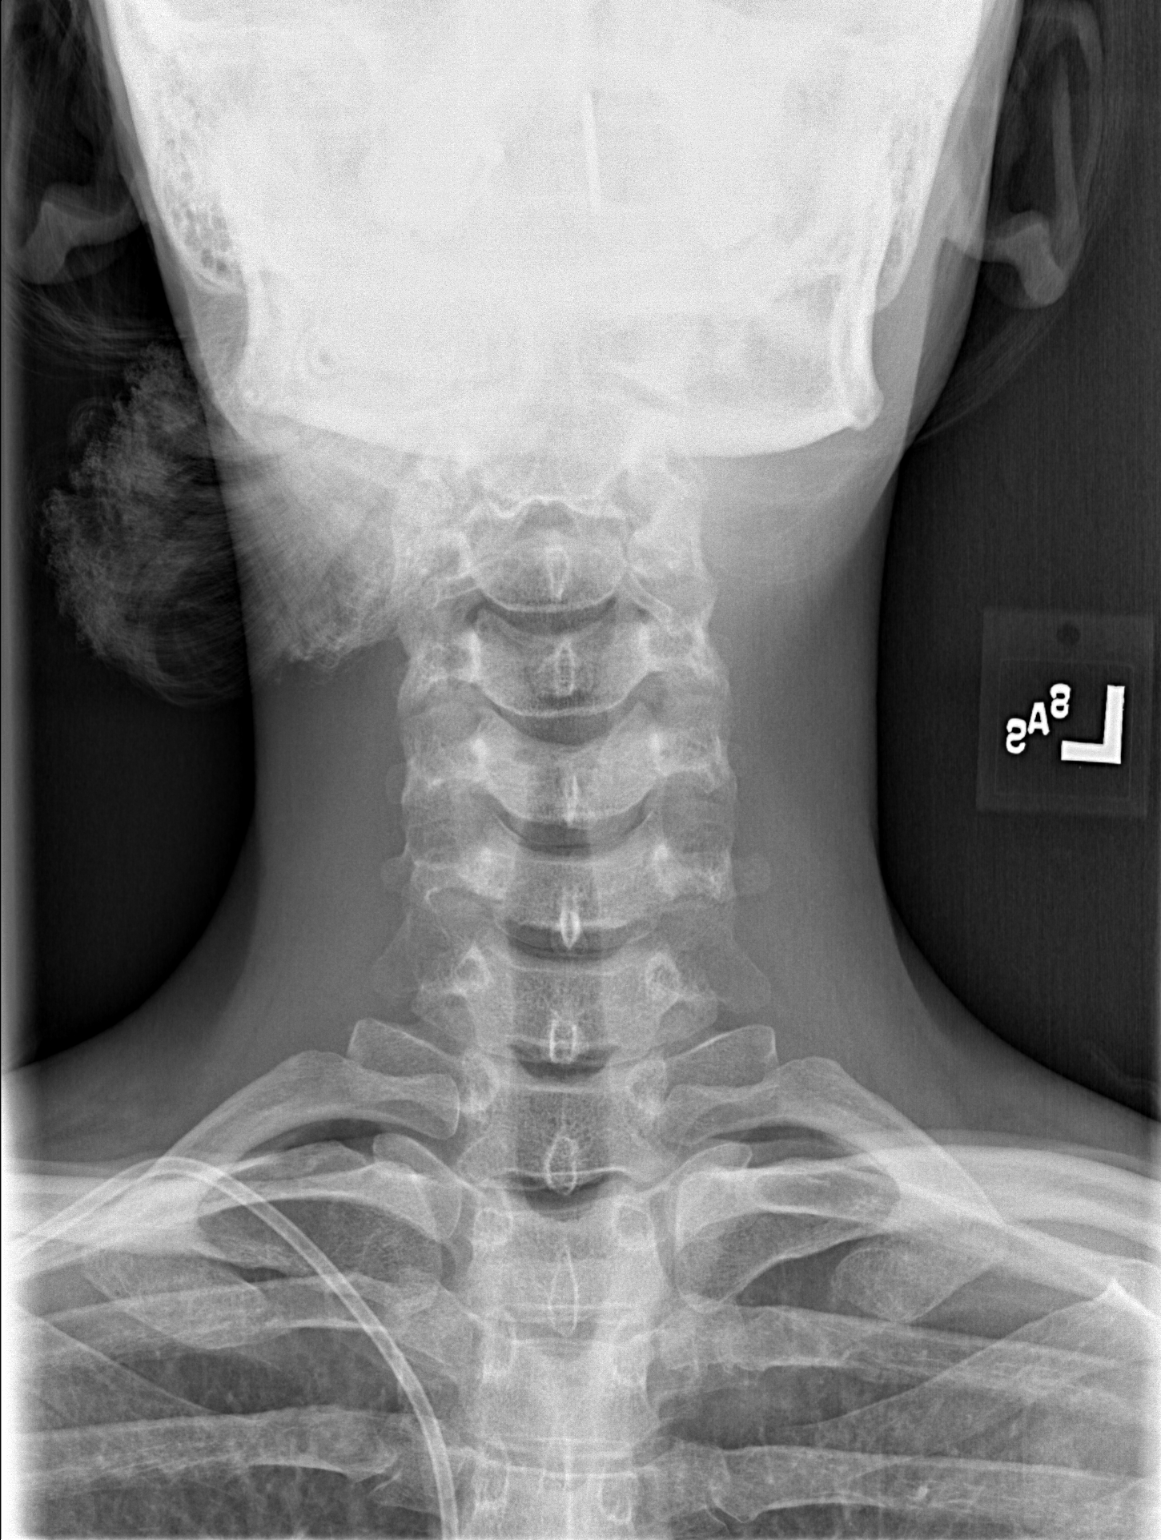

[3 of 3 positions shown; findings below may reference images not displayed]

FINDINGS: Prevertebral soft tissue thickness appears normal. There is
straightening of the cervical vertebral bodies. Epiglottis is
normal. Subglottic trachea is patent. Soft tissue fullness in the
submandibular region. No gas collection.
IMPRESSION: 1. Soft tissue fullness in the submandibular region. CT neck with
contrast may be obtained for further evaluation as indicated.
2. Otherwise negative neck soft tissues.

## 2018-07-11 ENCOUNTER — Inpatient Hospital Stay: Payer: 59

## 2018-07-11 ENCOUNTER — Inpatient Hospital Stay (HOSPITAL_BASED_OUTPATIENT_CLINIC_OR_DEPARTMENT_OTHER): Payer: 59 | Admitting: Hematology and Oncology

## 2018-07-11 ENCOUNTER — Other Ambulatory Visit: Payer: Self-pay

## 2018-07-11 ENCOUNTER — Telehealth: Payer: Self-pay | Admitting: Hematology and Oncology

## 2018-07-11 ENCOUNTER — Encounter: Payer: Self-pay | Admitting: Hematology and Oncology

## 2018-07-11 ENCOUNTER — Ambulatory Visit (HOSPITAL_COMMUNITY)
Admission: RE | Admit: 2018-07-11 | Discharge: 2018-07-11 | Disposition: A | Discharge status: Home / Self Care | Payer: 59 | Source: Ambulatory Visit | Attending: Hematology and Oncology | Admitting: Hematology and Oncology

## 2018-07-11 ENCOUNTER — Inpatient Hospital Stay: Payer: 59 | Attending: Hematology and Oncology

## 2018-07-11 DIAGNOSIS — E875 Hyperkalemia: Secondary | ICD-10-CM | POA: Diagnosis not present

## 2018-07-11 DIAGNOSIS — G893 Neoplasm related pain (acute) (chronic): Secondary | ICD-10-CM

## 2018-07-11 DIAGNOSIS — C8118 Nodular sclerosis classical Hodgkin lymphoma, lymph nodes of multiple sites: Secondary | ICD-10-CM | POA: Diagnosis present

## 2018-07-11 DIAGNOSIS — C8198 Hodgkin lymphoma, unspecified, lymph nodes of multiple sites: Secondary | ICD-10-CM

## 2018-07-11 DIAGNOSIS — Z5112 Encounter for antineoplastic immunotherapy: Secondary | ICD-10-CM | POA: Insufficient documentation

## 2018-07-11 DIAGNOSIS — Z79899 Other long term (current) drug therapy: Secondary | ICD-10-CM | POA: Insufficient documentation

## 2018-07-11 DIAGNOSIS — D279 Benign neoplasm of unspecified ovary: Secondary | ICD-10-CM | POA: Insufficient documentation

## 2018-07-11 DIAGNOSIS — F17290 Nicotine dependence, other tobacco product, uncomplicated: Secondary | ICD-10-CM | POA: Insufficient documentation

## 2018-07-11 DIAGNOSIS — J438 Other emphysema: Secondary | ICD-10-CM

## 2018-07-11 DIAGNOSIS — Z5111 Encounter for antineoplastic chemotherapy: Secondary | ICD-10-CM

## 2018-07-11 DIAGNOSIS — D270 Benign neoplasm of right ovary: Secondary | ICD-10-CM

## 2018-07-11 DIAGNOSIS — J439 Emphysema, unspecified: Secondary | ICD-10-CM | POA: Insufficient documentation

## 2018-07-11 LAB — CBC WITH DIFFERENTIAL/PLATELET
ABS IMMATURE GRANULOCYTES: 0.03 10*3/uL (ref 0.00–0.07)
Basophils Absolute: 0.1 10*3/uL (ref 0.0–0.1)
Basophils Relative: 1 %
Eosinophils Absolute: 0.2 10*3/uL (ref 0.0–0.5)
Eosinophils Relative: 2 %
HCT: 41.5 % (ref 36.0–46.0)
Hemoglobin: 14 g/dL (ref 12.0–15.0)
Immature Granulocytes: 0 %
LYMPHS PCT: 30 %
Lymphs Abs: 2.9 10*3/uL (ref 0.7–4.0)
MCH: 33.3 pg (ref 26.0–34.0)
MCHC: 33.7 g/dL (ref 30.0–36.0)
MCV: 98.6 fL (ref 80.0–100.0)
Monocytes Absolute: 0.7 10*3/uL (ref 0.1–1.0)
Monocytes Relative: 8 %
Neutro Abs: 5.9 10*3/uL (ref 1.7–7.7)
Neutrophils Relative %: 59 %
Platelets: 285 10*3/uL (ref 150–400)
RBC: 4.21 MIL/uL (ref 3.87–5.11)
RDW: 13.7 % (ref 11.5–15.5)
WBC: 9.9 10*3/uL (ref 4.0–10.5)
nRBC: 0 % (ref 0.0–0.2)

## 2018-07-11 LAB — TSH: TSH: 1.475 u[IU]/mL (ref 0.308–3.960)

## 2018-07-11 LAB — COMPREHENSIVE METABOLIC PANEL
ALT: 19 U/L (ref 0–44)
AST: 19 U/L (ref 15–41)
Albumin: 4.4 g/dL (ref 3.5–5.0)
Alkaline Phosphatase: 132 U/L — ABNORMAL HIGH (ref 38–126)
Anion gap: 12 (ref 5–15)
BUN: 15 mg/dL (ref 6–20)
CO2: 24 mmol/L (ref 22–32)
Calcium: 10 mg/dL (ref 8.9–10.3)
Chloride: 107 mmol/L (ref 98–111)
Creatinine, Ser: 1.18 mg/dL — ABNORMAL HIGH (ref 0.44–1.00)
GFR calc Af Amer: >60 mL/min (ref >60)
GFR calc non Af Amer: >60 mL/min (ref >60)
Glucose, Bld: 82 mg/dL (ref 70–99)
Potassium: 5.6 mmol/L — ABNORMAL HIGH (ref 3.5–5.1)
SODIUM: 143 mmol/L (ref 135–145)
Total Bilirubin: 1.2 mg/dL (ref 0.3–1.2)
Total Protein: 8 g/dL (ref 6.5–8.1)

## 2018-07-11 MED ORDER — IOHEXOL 300 MG/ML  SOLN
100.0000 mL | Freq: Once | INTRAMUSCULAR | Status: AC | PRN
  Administered 2018-07-11: 100 mL via INTRAVENOUS

## 2018-07-11 MED ORDER — SODIUM CHLORIDE (PF) 0.9 % IJ SOLN
INTRAMUSCULAR | Status: AC
  Filled 2018-07-11: qty 50

## 2018-07-11 NOTE — Assessment & Plan Note (Signed)
This is stable.  I do not recommend surgical resection due to lack of symptoms

## 2018-07-11 NOTE — Assessment & Plan Note (Signed)
She has emphysematous changes in her lung secondary to smoking I recommend the patient to start slow taper I do not recommend starting her on nicotine replacement product due to history of noncompliance

## 2018-07-11 NOTE — Assessment & Plan Note (Signed)
Her recent CT scan showed no evidence of disease She will continue pembrolizumab indefinitely I plan to repeat imaging study again in 6 months, due around September

## 2018-07-11 NOTE — Assessment & Plan Note (Signed)
We discussed the importance of nicotine cessation The patient is motivated to quit

## 2018-07-11 NOTE — Assessment & Plan Note (Signed)
She has mild acute transient hyperkalemia with no symptom I suspect could be spuriously result She has multiple bowel movements after her CT scan Recommend close observation only

## 2018-07-11 NOTE — Progress Notes (Signed)
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  I do not recommend surgical resection due to lack of symptoms  Acute hyperkalemia She has mild acute transient hyperkalemia with no symptom I suspect could be spuriously result She has multiple bowel movements after her CT scan Recommend close observation only   No orders of the defined types were placed in this encounter.   INTERVAL HISTORY: Please see below for problem oriented charting. 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    05/07/2014 Imaging    CT scan of the chest show right thoracic inlet lymphadenopathy    06/07/2014 Procedure    She underwent ultrasound-guided core biopsy of the neck lymph node    06/07/2014 Pathology Results    Accession: [id number] biopsy confirmed diagnosis of Hodgkin lymphoma.    06/15/2014 Imaging    Echocardiogram showed preserved ejection fraction    07/09/2014 - 07/12/2014 Hospital Admission    She was admitted to the hospital for severe anemia.    07/27/2014 Procedure    She had placement of port    07/31/2014 - 09/11/2014 Chemotherapy    She received dose adjusted chemotherapy due to abnormal liver function tests and severe anemia. Treatment was delayed due to noncompliance  and subsequently stopped because the patient failed to keep appointments    01/11/2015 Imaging    Repeat PET CT scan showed response to treatment    01/28/2015 - 06/18/2015 Chemotherapy    ABVD was restarted with full dose.    02/08/2015 - 02/10/2015 Hospital Admission    The patient was admitted to the hospital due to pancytopenia and profuse diarrhea. Cultures were negative. She was placed on ciprofloxacin.    02/11/2015 Adverse Reaction    Treatment was placed on hold due to recent infection.    04/11/2015 Imaging    PET CT scan showed near complete response. Incidental finding of an abnormal bone lesion, indeterminate. She is not symptomatic. Recommendation from Hem  TB to observe    07/11/2015 Imaging    PET CT scan showed abnormal new bone lesions, suggestive of possible disease progression    07/23/2015 Bone Marrow Biopsy    She underwent bone biopsy    07/23/2015 Pathology Results    Accession: [id number]  biopsy was negative for cancer    11/21/2015 Surgery    She had surgery  for ectopic pregnancy    01/29/2016 Imaging    Ct chest, abdomen and pelvis showed pelvic and retroperitoneal lymphadenopathy, as above, concerning for residual disease. There is also a mildly enlarged posterior mediastinal lymph node measuring 11 mm adjacent to the distal descending thoracic aorta. This may represent an additional focus of disease, but is the only finding of concern in the thorax on today's examination. Sclerosis in the right ilium at site of previously noted metabolically active lesion, grossly unchanged. No other definite osseous lesions are identified on today's examination. Spleen is normal in size and appearance.    02/14/2016 PET scan    Interval disease worsening with new foci of hypermetabolic activity in multiple retroperitoneal and pelvic lymph nodes as well as AP window and left hilar lymph nodes. (Deauville 5). There is also overall worsening of the osseous disease.    05/11/2016 Pathology Results    Diagnosis Lymph node, needle/core biopsy, Left para-aortic retroperitoneal - CLASSICAL HODGKIN LYMPHOMA. - SEE ONCOLOGY TABLE. Microscopic Comment LYMPHOMA Histologic type: Classical Hodgkin lymphoma. Grade (if applicable): N/A Flow cytometry: Not done. Immunohistochemical stains: CD15, CD20, CD3, LCA, PAX-5, CD30 with appropriate controls. Touch preps/imprints: Not performed. Comments: The sections show small needle core biopsy fragments displaying a polymorphous cellular proliferation of small lymphocytes, plasma cells, eosinophils, and large atypical mononuclear and multilobated lymphoid cells with features of Reed-Sternberg cells and variants. This is associated with patchy areas of fibrosis. Immunohistochemical stains were performed and show that the large atypical lymphoid cells are positive for CD30, CD15 and PAX-5 and negative for LCA, CD20, CD3. The small lymphoid cells in the background show a mixture of T and B cells with predominance of T cells. The overall  morphologic and histologic features are consistent with classical Hodgkin lymphoma. Further subtyping is challenging in limited small biopsy fragments but the patchy fibrosis suggests nodular sclerosis type.     05/11/2016 Procedure    She underwent CT guided biopsy of retroperitoneal lymph node    05/26/2016 Procedure    Successful placement of a right IJ approach Power Port with ultrasound and fluoroscopic guidance. The catheter is ready for use.    06/02/2016 PET scan    Mixed response to chemotherapy with some lymph nodes decreased in metabolic activity and some lymph nodes increase metabolic activity. Lymph node stations including mediastinum, periaortic retroperitoneum, and obturator node stations. Activity is remains relatively intense Deauville 4 2. LEFT infrahilar nodule / lymph node with intense metabolic activity decreased from prior. ( [place] 4 ). 3. 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 Deauville 5    06/03/2016 - 06/05/2016 Hospital Admission    She was admitted to the hospital for cycle 1 of ICE chemotherapy    06/24/2016 - 06/26/2016 Hospital Admission    She received cycle 2 of ICE chemo    07/07/2016 PET scan    Resolution of prior hypermetabolic adenopathy and resolution of prior osseous foci of hypermetabolic activity compatible with essentially complete response to therapy (Deauville 1). 2. Generalized reduced activity in the L4 vertebral body and the type of finding which would typically reflect  prior radiation therapy. 3. Stable septated fatty right pelvic lesion, possibly a dermoid or lipoma, not hypermetabolic    08/03/1446 PET scan    Hypermetabolic lesion along the L3 vertebral body and left posterior elements, max SUV 7.8 (Deauville 5). Hypermetabolic lesion along the left inferior pubic ramus, max SUV 4.9 (Deauville 4).  IMPRESSION: Prevascular lymphadenopathy, reflecting nodal recurrence (Deauville 4). Hypermetabolic osseous metastases  involving the L3 vertebral body/posterior elements and left inferior pubic ramus (Deauville 4-5). Hypermetabolism along the endometrium, new, possibly reactive/physiologic. Consider pelvic ultrasound and/or endometrial sampling as clinically warranted.    11/04/2016 - 02/24/2017 Chemotherapy    She received Brentuximab    12/11/2016 PET scan    1. Mixed response to therapy within the skeleton. Lesions at L3 is decreased in size and metabolic activity. Residual activity remains above liver ( [place]auville 4) 2. Increased activity in the LEFT sacrum with metabolic activity above liver activity ( [place] 4 3. Decrease in size and metabolic activity of anterior mediastinal tissue consistent with resolution of thymic hyperplasia or resolution of lymphoma metabolic activity ( Deauville 2). 4. No new lymphadenopathy. Normal spleen and liver.    03/25/2017 PET scan    1. Increased metabolic activity in several normal sized retroperitoneal and right pelvic lymph nodes, Deauville 4. 2. Increase in size and metabolic activity of lesions in the L3 and L4 vertebral bodies and in the left sacrum, Deauville 5. 3. No recurrence of anterior mediastinal abnormal activity. 4. Other imaging findings of potential clinical significance: Chronic ethmoid and left maxillary sinusitis. Paraseptal emphysema at the lung apices. Right ovarian dermoid.    04/06/2017 -  Chemotherapy    The patient had Keytruda    06/09/2017 PET scan    1. Overall significant improvement with reduction in nodal activity. Several retroperitoneal nodes which were previously Deauville 4 are currently [place] 3. The right common iliac lymph node remains at [place] 4 with maximum SUV of 3.4, but is improved from prior SUV of 4.3. 2. The bony lesions show the greatest improvement, with the previously Deauville 5 hypermetabolic lesions currently no longer of higher metabolic activity than surrounding bone marrow, currently measuring at [place]  3. 3. 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 4. No new lesions are identified. 5. Emphysema (ICD10-J43.9).    08/05/2017 Imaging    1. Somewhat complex left adnexal lesion, potentially hemorrhagic cyst measuring 2.9 x 2.8 cm. The possibility of torsion in the left ovary must be considered. Advise correlation with pelvic ultrasound including Doppler assessment to further evaluate.  2.  Right ovarian dermoid, unchanged from recent PET-CT examination.  3. No adenopathy by size criteria evident. Appearance of subcentimeter mesenteric lymph nodes is stable compared to recent study.  4. No bone lesions appreciable by CT. Areas of abnormal radiotracer uptake in the mid lumbar spine noted on recent PET-CT examination. No destruction 2 or lytic change noted in the lumbar vertebrae currently there.  5. No bowel obstruction. No abscess. No periappendiceal region inflammation.  6.  Small hiatal hernia with fluid in distal esophagus.  7.  Foci of coronary artery calcification, advanced for age.  8.  Spleen normal in size and contour.  9. Small hiatal hernia with mild fluid in the distal esophagus, likely indicative of a degree of reflux.    01/04/2018 Imaging    1. No findings to suggest residual/recurrent lymphoma in the chest, abdomen or pelvis. 2. Right ovarian dermoid slightly larger than prior examinations, currently measuring 3.8 x 2.8  x 3.1 cm. 3. Additional incidental findings, as above.    07/11/2018 Imaging    No findings suspicious for recurrent lymphoma. Spleen is normal in size.  Stable right ovarian dermoid.  4 mm subpleural nodular opacity in the posterior right lower lobe, favoring subpleural atelectasis.     REVIEW OF SYSTEMS:   Constitutional: Denies fevers, chills or abnormal weight loss Eyes: Denies blurriness of vision Ears, nose, mouth, throat, and face: Denies mucositis or sore throat Respiratory: Denies cough,  dyspnea or wheezes Cardiovascular: Denies palpitation, chest discomfort or lower extremity swelling Gastrointestinal:  Denies nausea, heartburn or change in bowel habits Skin: Denies abnormal skin rashes Lymphatics: Denies new lymphadenopathy or easy bruising Neurological:Denies numbness, tingling or new weaknesses Behavioral/Psych: Mood is stable, no new changes  All other systems were reviewed with the patient and are negative.  I have reviewed the past medical history, past surgical history, social history and family history with the patient and they are unchanged from previous note.  ALLERGIES:  is allergic to temazepam.  MEDICATIONS:  Current Outpatient Medications  Medication Sig Dispense Refill  . atazanavir (REYATAZ) 300 MG capsule TAKE ONE CAPSULE BY MOUTH DAILY WITH BREAKFAST. TAKE WITH NORVIR 90 capsule 1  . cholecalciferol (VITAMIN D) 1000 units tablet Take 1 tablet (1,000 Units total) by mouth daily. 30 tablet 9  . Clotrimazole 1 % OINT Apply 1 application topically 2 (two) times daily. 56.7 g 1  . emtricitabine-tenofovir (TRUVADA) 200-300 MG tablet Take 1 tablet by mouth daily. 90 tablet 1  . fluticasone (FLONASE) 50 MCG/ACT nasal spray Place 1 spray into both nostrils daily. 16 g 0  . hydrocortisone (PROCTOCORT) 1 % CREA Apply 1 application topically daily. 28.4 g 5  . hydrocortisone 2.5 % cream Apply topically daily. 28 g 5  . mirtazapine (REMERON) 15 MG tablet Take 1 tablet (15 mg total) by mouth at bedtime. 30 tablet 11  . ondansetron (ZOFRAN ODT) 8 MG disintegrating tablet Take 1 tablet (8 mg total) by mouth every 8 (eight) hours as needed for nausea. 20 tablet 0  . ritonavir (NORVIR) 100 MG TABS tablet TAKE 1 TABLET BY MOUTH EVERY MORNING WITH BREAKFAST. TAKE WITH REYATAZ 90 tablet 1  . traMADol (ULTRAM) 50 MG tablet Take 2 tablets (100 mg total) by mouth every 6 (six) hours as needed for moderate pain. 90 tablet 0   No current facility-administered medications for  this visit.    Facility-Administered Medications Ordered in Other Visits  Medication Dose Route Frequency Provider Last Rate Last Dose  . sodium chloride (PF) 0.9 % injection             PHYSICAL EXAMINATION: ECOG PERFORMANCE STATUS: 1 - Symptomatic but completely ambulatory  Vitals:   07/11/18 1610  BP: 106/80  Pulse: 77  Resp: 18  Temp: 98.5 F (36.9 C)  SpO2: 100%   Filed Weights   07/11/18 1610  Weight: 110 lb 2 oz (50 kg)    GENERAL:alert, no distress and comfortable SKIN: skin color, texture, turgor are normal, no rashes or significant lesions EYES: normal, Conjunctiva are pink and non-injected, sclera clear OROPHARYNX:no exudate, no erythema and lips, buccal mucosa, and tongue normal  NECK: supple, thyroid normal size, non-tender, without nodularity LYMPH:  no palpable lymphadenopathy in the cervical, axillary or inguinal LUNGS: clear to auscultation and percussion with normal breathing effort HEART: regular rate & rhythm and no murmurs and no lower extremity edema ABDOMEN:abdomen soft, non-tender and normal bowel sounds Musculoskeletal:no cyanosis of digits and  no clubbing  NEURO: alert & oriented x 3 with fluent speech, no focal motor/sensory deficits  LABORATORY DATA:  I have reviewed the data as listed    Component Value Date/Time   NA 143 07/11/2018 0928   NA 140 04/29/2017 0745   K 5.6 (H) 07/11/2018 0928   K 3.3 (L) 04/29/2017 0745   CL 107 07/11/2018 0928   CO2 24 07/11/2018 0928   CO2 23 04/29/2017 0745   GLUCOSE 82 07/11/2018 0928   GLUCOSE 83 04/29/2017 0745   BUN 15 07/11/2018 0928   BUN 10.7 04/29/2017 0745   CREATININE 1.18 (H) 07/11/2018 0928   CREATININE 1.2 (H) 04/29/2017 0745   CALCIUM 10.0 07/11/2018 0928   CALCIUM 9.8 04/29/2017 0745   PROT 8.0 07/11/2018 0928   PROT 7.6 04/29/2017 0745   ALBUMIN 4.4 07/11/2018 0928   ALBUMIN 4.0 04/29/2017 0745   AST 19 07/11/2018 0928   AST 19 04/29/2017 0745   ALT 19 07/11/2018 0928   ALT 19  04/29/2017 0745   ALKPHOS 132 (H) 07/11/2018 0928   ALKPHOS 81 04/29/2017 0745   BILITOT 1.2 07/11/2018 0928   BILITOT 1.31 (H) 04/29/2017 0745   GFRNONAA >60 07/11/2018 0928   GFRNONAA 83 02/20/2016 0908   GFRAA >60 07/11/2018 0928   GFRAA >89 02/20/2016 0908    No results found for: SPEP, UPEP  Lab Results  Component Value Date   WBC 9.9 07/11/2018   NEUTROABS 5.9 07/11/2018   HGB 14.0 07/11/2018   HCT 41.5 07/11/2018   MCV 98.6 07/11/2018   PLT 285 07/11/2018      Chemistry      Component Value Date/Time   NA 143 07/11/2018 0928   NA 140 04/29/2017 0745   K 5.6 (H) 07/11/2018 0928   K 3.3 (L) 04/29/2017 0745   CL 107 07/11/2018 0928   CO2 24 07/11/2018 0928   CO2 23 04/29/2017 0745   BUN 15 07/11/2018 0928   BUN 10.7 04/29/2017 0745   CREATININE 1.18 (H) 07/11/2018 0928   CREATININE 1.2 (H) 04/29/2017 0745      Component Value Date/Time   CALCIUM 10.0 07/11/2018 0928   CALCIUM 9.8 04/29/2017 0745   ALKPHOS 132 (H) 07/11/2018 0928   ALKPHOS 81 04/29/2017 0745   AST 19 07/11/2018 0928   AST 19 04/29/2017 0745   ALT 19 07/11/2018 0928   ALT 19 04/29/2017 0745   BILITOT 1.2 07/11/2018 0928   BILITOT 1.31 (H) 04/29/2017 0745       RADIOGRAPHIC STUDIES: I have reviewed multiple imaging studies with the patient I have personally reviewed the radiological images as listed and agreed with the findings in the report. Ct Chest W Contrast  Result Date: 07/11/2018 CLINICAL DATA:  Hodgkin's lymphoma, diagnosed 2016, chemotherapy ongoing EXAM: CT CHEST, ABDOMEN, AND PELVIS WITH CONTRAST TECHNIQUE: Multidetector CT imaging of the chest, abdomen and pelvis was performed following the standard protocol during bolus administration of intravenous contrast. CONTRAST:  115m OMNIPAQUE IOHEXOL 300 MG/ML  SOLN COMPARISON:  01/04/2018 FINDINGS: CT CHEST FINDINGS Cardiovascular: The heart is normal in size. No pericardial effusion. No evidence of thoracic aortic aneurysm. Right chest  port terminates at the cavoatrial junction. Mediastinum/Nodes: No suspicious mediastinal, hilar, or axillary lymphadenopathy. Prominent triangular anterior mediastinal soft tissue (series 2/image 27), reflecting thymic hyperplasia. Visualized thyroid is unremarkable. Lungs/Pleura: Mild paraseptal emphysematous changes, upper lobe predominant. 4 mm triangular subpleural nodule in the posterior right lower lobe (series 6/image 84), new, favoring subpleural atelectasis. 3 mm  subpleural right middle lobe nodule (series 6/image 81), unchanged, benign. No suspicious pulmonary nodules. No focal consolidation. No pleural effusion or pneumothorax. Musculoskeletal: Mild superior endplate changes at Q7-6 (sagittal image 88). CT ABDOMEN PELVIS FINDINGS Hepatobiliary: Liver is within normal limits. Gallbladder is unremarkable. No intrahepatic or extrahepatic ductal dilatation. Pancreas: Within normal limits. Spleen: Spleen is normal in size. Adrenals/Urinary Tract: Adrenal glands are within normal limits. Kidneys are within normal limits.  No hydronephrosis. Bladder is mildly thick-walled. Stomach/Bowel: Stomach is within normal limits. No evidence of bowel obstruction. Appendix is not discretely visualized. Vascular/Lymphatic: No evidence of abdominal aortic aneurysm. No suspicious abdominopelvic lymphadenopathy. Reproductive: Uterus and left ovary are within normal limits. 3.7 cm right ovarian dermoid (series 2/image 107), unchanged, benign. Other: No abdominopelvic ascites. Musculoskeletal: Visualized osseous structures are within normal limits. IMPRESSION: No findings suspicious for recurrent lymphoma. Spleen is normal in size. Stable right ovarian dermoid. 4 mm subpleural nodular opacity in the posterior right lower lobe, favoring subpleural atelectasis. Electronically Signed   By: Julian Hy M.D.   On: 07/11/2018 13:31   Ct Abdomen Pelvis W Contrast  Result Date: 07/11/2018 CLINICAL DATA:  Hodgkin's lymphoma,  diagnosed 2016, chemotherapy ongoing EXAM: CT CHEST, ABDOMEN, AND PELVIS WITH CONTRAST TECHNIQUE: Multidetector CT imaging of the chest, abdomen and pelvis was performed following the standard protocol during bolus administration of intravenous contrast. CONTRAST:  175m OMNIPAQUE IOHEXOL 300 MG/ML  SOLN COMPARISON:  01/04/2018 FINDINGS: CT CHEST FINDINGS Cardiovascular: The heart is normal in size. No pericardial effusion. No evidence of thoracic aortic aneurysm. Right chest port terminates at the cavoatrial junction. Mediastinum/Nodes: No suspicious mediastinal, hilar, or axillary lymphadenopathy. Prominent triangular anterior mediastinal soft tissue (series 2/image 27), reflecting thymic hyperplasia. Visualized thyroid is unremarkable. Lungs/Pleura: Mild paraseptal emphysematous changes, upper lobe predominant. 4 mm triangular subpleural nodule in the posterior right lower lobe (series 6/image 84), new, favoring subpleural atelectasis. 3 mm subpleural right middle lobe nodule (series 6/image 81), unchanged, benign. No suspicious pulmonary nodules. No focal consolidation. No pleural effusion or pneumothorax. Musculoskeletal: Mild superior endplate changes at TA2-6(sagittal image 88). CT ABDOMEN PELVIS FINDINGS Hepatobiliary: Liver is within normal limits. Gallbladder is unremarkable. No intrahepatic or extrahepatic ductal dilatation. Pancreas: Within normal limits. Spleen: Spleen is normal in size. Adrenals/Urinary Tract: Adrenal glands are within normal limits. Kidneys are within normal limits.  No hydronephrosis. Bladder is mildly thick-walled. Stomach/Bowel: Stomach is within normal limits. No evidence of bowel obstruction. Appendix is not discretely visualized. Vascular/Lymphatic: No evidence of abdominal aortic aneurysm. No suspicious abdominopelvic lymphadenopathy. Reproductive: Uterus and left ovary are within normal limits. 3.7 cm right ovarian dermoid (series 2/image 107), unchanged, benign. Other: No  abdominopelvic ascites. Musculoskeletal: Visualized osseous structures are within normal limits. IMPRESSION: No findings suspicious for recurrent lymphoma. Spleen is normal in size. Stable right ovarian dermoid. 4 mm subpleural nodular opacity in the posterior right lower lobe, favoring subpleural atelectasis. Electronically Signed   By: SJulian HyM.D.   On: 07/11/2018 13:31    All questions were answered. The patient knows to call the clinic with any problems, questions or concerns. No barriers to learning was detected.  I spent 30 minutes counseling the patient face to face. The total time spent in the appointment was 40 minutes and more than 50% was on counseling and review of test results  NHeath Lark MD 07/11/2018 4:51 PM

## 2018-07-11 NOTE — Telephone Encounter (Signed)
Gave patient avs report and appointments for March thru June.

## 2018-07-11 NOTE — Assessment & Plan Note (Signed)
She has multiple chronic joint pain and is dependent on tramadol to control her joint pain She is taking vitamin D supplement as prescribed We discussed refill policy. 

## 2018-07-12 ENCOUNTER — Ambulatory Visit: Payer: Self-pay

## 2018-07-12 ENCOUNTER — Other Ambulatory Visit: Payer: Self-pay | Admitting: Hematology and Oncology

## 2018-07-12 ENCOUNTER — Inpatient Hospital Stay: Payer: 59

## 2018-07-12 ENCOUNTER — Ambulatory Visit: Payer: Self-pay | Admitting: Hematology and Oncology

## 2018-07-12 VITALS — BP 92/60 | HR 69 | Temp 98.1°F | Resp 18

## 2018-07-12 DIAGNOSIS — Z95828 Presence of other vascular implants and grafts: Secondary | ICD-10-CM

## 2018-07-12 DIAGNOSIS — Z5112 Encounter for antineoplastic immunotherapy: Secondary | ICD-10-CM | POA: Diagnosis not present

## 2018-07-12 DIAGNOSIS — C8118 Nodular sclerosis classical Hodgkin lymphoma, lymph nodes of multiple sites: Secondary | ICD-10-CM

## 2018-07-12 MED ORDER — SODIUM CHLORIDE 0.9 % IV SOLN
200.0000 mg | Freq: Once | INTRAVENOUS | Status: AC
  Administered 2018-07-12: 200 mg via INTRAVENOUS
  Filled 2018-07-12: qty 8

## 2018-07-12 MED ORDER — LEUPROLIDE ACETATE 7.5 MG IM KIT
7.5000 mg | PACK | Freq: Once | INTRAMUSCULAR | Status: AC
  Administered 2018-07-12: 7.5 mg via INTRAMUSCULAR
  Filled 2018-07-12: qty 7.5

## 2018-07-12 MED ORDER — HEPARIN SOD (PORK) LOCK FLUSH 100 UNIT/ML IV SOLN
500.0000 [IU] | Freq: Once | INTRAVENOUS | Status: AC | PRN
  Administered 2018-07-12: 500 [IU]
  Filled 2018-07-12: qty 5

## 2018-07-12 MED ORDER — SODIUM CHLORIDE 0.9% FLUSH
10.0000 mL | INTRAVENOUS | Status: DC | PRN
  Administered 2018-07-12: 10 mL
  Filled 2018-07-12: qty 10

## 2018-07-12 MED ORDER — SODIUM CHLORIDE 0.9 % IV SOLN
Freq: Once | INTRAVENOUS | Status: AC
  Administered 2018-07-12: 08:00:00 via INTRAVENOUS
  Filled 2018-07-12: qty 250

## 2018-07-12 NOTE — Patient Instructions (Signed)
[facility] Discharge Instructions for Patients Receiving Chemotherapy  Today you received the following chemotherapy agents:  Keytruda.  To help prevent nausea and vomiting after your treatment, we encourage you to take your nausea medication as directed.   If you develop nausea and vomiting that is not controlled by your nausea medication, call the clinic.   BELOW ARE SYMPTOMS THAT SHOULD BE REPORTED IMMEDIATELY:  *FEVER GREATER THAN 100.5 F  *CHILLS WITH OR WITHOUT FEVER  NAUSEA AND VOMITING THAT IS NOT CONTROLLED WITH YOUR NAUSEA MEDICATION  *UNUSUAL SHORTNESS OF BREATH  *UNUSUAL BRUISING OR BLEEDING  TENDERNESS IN MOUTH AND THROAT WITH OR WITHOUT PRESENCE OF ULCERS  *URINARY PROBLEMS  *BOWEL PROBLEMS  UNUSUAL RASH Items with * indicate a potential emergency and should be followed up as soon as possible.  Feel free to call the clinic should you have any questions or concerns. The clinic phone number is [phone].  Please show the CHEMO ALERT CARD at check-in to the Emergency Department and triage nurse.    

## 2018-07-14 ENCOUNTER — Telehealth: Payer: Self-pay

## 2018-07-14 ENCOUNTER — Other Ambulatory Visit: Payer: Self-pay | Admitting: Hematology and Oncology

## 2018-07-14 DIAGNOSIS — B20 Human immunodeficiency virus [HIV] disease: Secondary | ICD-10-CM

## 2018-07-14 MED ORDER — TRAMADOL HCL 50 MG PO TABS: 100.0000 mg | ORAL_TABLET | Freq: Four times a day (QID) | ORAL | 0 refills | Status: DC | PRN

## 2018-07-14 NOTE — Telephone Encounter (Signed)
done

## 2018-07-14 NOTE — Telephone Encounter (Signed)
She called and left a message. Requesting Tramadol Rx be sent to pharmacy.

## 2018-07-25 ENCOUNTER — Telehealth: Payer: Self-pay

## 2018-07-25 ENCOUNTER — Other Ambulatory Visit: Payer: Self-pay | Admitting: Hematology and Oncology

## 2018-07-25 DIAGNOSIS — B20 Human immunodeficiency virus [HIV] disease: Secondary | ICD-10-CM

## 2018-07-25 MED ORDER — TRAMADOL HCL 50 MG PO TABS: 100.0000 mg | ORAL_TABLET | Freq: Four times a day (QID) | ORAL | 0 refills | Status: DC | PRN

## 2018-07-25 NOTE — Telephone Encounter (Signed)
She called requesting refill on Tramadol and ask that it be sent to [facility].

## 2018-07-25 NOTE — Telephone Encounter (Signed)
done

## 2018-08-03 ENCOUNTER — Telehealth: Payer: Self-pay | Admitting: *Deleted

## 2018-08-03 NOTE — Telephone Encounter (Signed)
Patient left a message requesting refills on Hydrocodone and Zofran. She would like these sent to [facility] on [address].

## 2018-08-04 ENCOUNTER — Other Ambulatory Visit: Payer: Self-pay | Admitting: Hematology and Oncology

## 2018-08-04 ENCOUNTER — Other Ambulatory Visit: Payer: Self-pay

## 2018-08-04 DIAGNOSIS — B20 Human immunodeficiency virus [HIV] disease: Secondary | ICD-10-CM

## 2018-08-04 MED ORDER — ONDANSETRON 8 MG PO TBDP: 8.0000 mg | ORAL_TABLET | Freq: Three times a day (TID) | ORAL | 11 refills | Status: DC | PRN

## 2018-08-04 MED ORDER — HYDROCORTISONE 2.5 % EX CREA: TOPICAL_CREAM | Freq: Every day | CUTANEOUS | 0 refills | Status: DC

## 2018-08-04 MED ORDER — TRAMADOL HCL 50 MG PO TABS: 100.0000 mg | ORAL_TABLET | Freq: Four times a day (QID) | ORAL | 0 refills | Status: DC | PRN

## 2018-08-05 ENCOUNTER — Encounter: Payer: Self-pay | Admitting: Pharmacist

## 2018-08-09 ENCOUNTER — Inpatient Hospital Stay: Payer: 59

## 2018-08-09 ENCOUNTER — Inpatient Hospital Stay: Payer: 59 | Attending: Hematology and Oncology | Admitting: Hematology and Oncology

## 2018-08-09 ENCOUNTER — Encounter: Payer: Self-pay | Admitting: Hematology and Oncology

## 2018-08-09 ENCOUNTER — Other Ambulatory Visit: Payer: Self-pay

## 2018-08-09 DIAGNOSIS — B20 Human immunodeficiency virus [HIV] disease: Secondary | ICD-10-CM

## 2018-08-09 DIAGNOSIS — Z5111 Encounter for antineoplastic chemotherapy: Secondary | ICD-10-CM | POA: Insufficient documentation

## 2018-08-09 DIAGNOSIS — Z95828 Presence of other vascular implants and grafts: Secondary | ICD-10-CM

## 2018-08-09 DIAGNOSIS — G893 Neoplasm related pain (acute) (chronic): Secondary | ICD-10-CM

## 2018-08-09 DIAGNOSIS — C8118 Nodular sclerosis classical Hodgkin lymphoma, lymph nodes of multiple sites: Secondary | ICD-10-CM

## 2018-08-09 DIAGNOSIS — F17290 Nicotine dependence, other tobacco product, uncomplicated: Secondary | ICD-10-CM

## 2018-08-09 DIAGNOSIS — Z79899 Other long term (current) drug therapy: Secondary | ICD-10-CM | POA: Insufficient documentation

## 2018-08-09 DIAGNOSIS — C8198 Hodgkin lymphoma, unspecified, lymph nodes of multiple sites: Secondary | ICD-10-CM

## 2018-08-09 DIAGNOSIS — Z5112 Encounter for antineoplastic immunotherapy: Secondary | ICD-10-CM | POA: Diagnosis present

## 2018-08-09 LAB — CBC WITH DIFFERENTIAL/PLATELET
Abs Immature Granulocytes: 0.04 10*3/uL (ref 0.00–0.07)
Basophils Absolute: 0 10*3/uL (ref 0.0–0.1)
Basophils Relative: 0 %
Eosinophils Absolute: 0.1 10*3/uL (ref 0.0–0.5)
Eosinophils Relative: 1 %
HCT: 40 % (ref 36.0–46.0)
Hemoglobin: 13.7 g/dL (ref 12.0–15.0)
Immature Granulocytes: 0 %
Lymphocytes Relative: 21 %
Lymphs Abs: 2.5 10*3/uL (ref 0.7–4.0)
MCH: 32.8 pg (ref 26.0–34.0)
MCHC: 34.3 g/dL (ref 30.0–36.0)
MCV: 95.7 fL (ref 80.0–100.0)
Monocytes Absolute: 0.7 10*3/uL (ref 0.1–1.0)
Monocytes Relative: 6 %
Neutro Abs: 8.4 10*3/uL — ABNORMAL HIGH (ref 1.7–7.7)
Neutrophils Relative %: 72 %
Platelets: 250 10*3/uL (ref 150–400)
RBC: 4.18 MIL/uL (ref 3.87–5.11)
RDW: 13.6 % (ref 11.5–15.5)
WBC: 11.8 10*3/uL — ABNORMAL HIGH (ref 4.0–10.5)
nRBC: 0 % (ref 0.0–0.2)

## 2018-08-09 LAB — COMPREHENSIVE METABOLIC PANEL
ALT: 12 U/L (ref 0–44)
AST: 16 U/L (ref 15–41)
Albumin: 4.2 g/dL (ref 3.5–5.0)
Alkaline Phosphatase: 129 U/L — ABNORMAL HIGH (ref 38–126)
Anion gap: 12 (ref 5–15)
BUN: 14 mg/dL (ref 6–20)
CO2: 22 mmol/L (ref 22–32)
Calcium: 9.1 mg/dL (ref 8.9–10.3)
Chloride: 107 mmol/L (ref 98–111)
Creatinine, Ser: 0.99 mg/dL (ref 0.44–1.00)
GFR calc Af Amer: >60 mL/min (ref >60)
GFR calc non Af Amer: >60 mL/min (ref >60)
Glucose, Bld: 85 mg/dL (ref 70–99)
Potassium: 3.7 mmol/L (ref 3.5–5.1)
Sodium: 141 mmol/L (ref 135–145)
Total Bilirubin: 0.8 mg/dL (ref 0.3–1.2)
Total Protein: 7.7 g/dL (ref 6.5–8.1)

## 2018-08-09 LAB — TSH: TSH: 2.097 u[IU]/mL (ref 0.308–3.960)

## 2018-08-09 MED ORDER — HEPARIN SOD (PORK) LOCK FLUSH 100 UNIT/ML IV SOLN
500.0000 [IU] | Freq: Once | INTRAVENOUS | Status: AC | PRN
  Administered 2018-08-09: 500 [IU]
  Filled 2018-08-09: qty 5

## 2018-08-09 MED ORDER — SODIUM CHLORIDE 0.9% FLUSH
10.0000 mL | INTRAVENOUS | Status: DC | PRN
  Administered 2018-08-09: 10 mL
  Filled 2018-08-09: qty 10

## 2018-08-09 MED ORDER — LEUPROLIDE ACETATE 7.5 MG IM KIT
7.5000 mg | PACK | Freq: Once | INTRAMUSCULAR | Status: AC
  Administered 2018-08-09: 7.5 mg via INTRAMUSCULAR
  Filled 2018-08-09: qty 7.5

## 2018-08-09 MED ORDER — SODIUM CHLORIDE 0.9% FLUSH
10.0000 mL | INTRAVENOUS | Status: DC | PRN
  Administered 2018-08-09: 08:00:00 10 mL via INTRAVENOUS
  Filled 2018-08-09: qty 10

## 2018-08-09 MED ORDER — SODIUM CHLORIDE 0.9 % IV SOLN
Freq: Once | INTRAVENOUS | Status: AC
  Administered 2018-08-09: 09:00:00 via INTRAVENOUS
  Filled 2018-08-09: qty 250

## 2018-08-09 MED ORDER — SODIUM CHLORIDE 0.9 % IV SOLN
200.0000 mg | Freq: Once | INTRAVENOUS | Status: AC
  Administered 2018-08-09: 200 mg via INTRAVENOUS
  Filled 2018-08-09: qty 8

## 2018-08-09 NOTE — Assessment & Plan Note (Signed)
She has multiple chronic joint pain and is dependent on tramadol to control her joint pain She is taking vitamin D supplement as prescribed We discussed refill policy. 

## 2018-08-09 NOTE — Assessment & Plan Note (Signed)
We discussed the importance of nicotine cessation The patient is motivated to quit on her own

## 2018-08-09 NOTE — Patient Instructions (Signed)
[facility] Discharge Instructions for Patients Receiving Chemotherapy  Today you received the following chemotherapy agents Pembrolizumab [facility]).  To help prevent nausea and vomiting after your treatment, we encourage you to take your nausea medication as prescribed.   If you develop nausea and vomiting that is not controlled by your nausea medication, call the clinic.   BELOW ARE SYMPTOMS THAT SHOULD BE REPORTED IMMEDIATELY:  *FEVER GREATER THAN 100.5 F  *CHILLS WITH OR WITHOUT FEVER  NAUSEA AND VOMITING THAT IS NOT CONTROLLED WITH YOUR NAUSEA MEDICATION  *UNUSUAL SHORTNESS OF BREATH  *UNUSUAL BRUISING OR BLEEDING  TENDERNESS IN MOUTH AND THROAT WITH OR WITHOUT PRESENCE OF ULCERS  *URINARY PROBLEMS  *BOWEL PROBLEMS  UNUSUAL RASH Items with * indicate a potential emergency and should be followed up as soon as possible.  Feel free to call the clinic should you have any questions or concerns. The clinic phone number is [phone].  Please show the [facility] at check-in to the Emergency Department and triage nurse.  Leuprolide injection What is this medicine? LEUPROLIDE (loo PROE lide) is a man-made hormone. It is used to treat the symptoms of prostate cancer. This medicine may also be used to treat children with early onset of puberty. It may be used for other hormonal conditions. This medicine may be used for other purposes; ask your health care provider or pharmacist if you have questions. COMMON BRAND NAME(S): Lupron What should I tell my health care provider before I take this medicine? They need to know if you have any of these conditions: -diabetes -heart disease or previous heart attack -high blood pressure -high cholesterol -pain or difficulty passing urine -spinal cord metastasis -stroke -tobacco smoker -an unusual or allergic reaction to leuprolide, benzyl alcohol, other medicines, foods, dyes, or preservatives -pregnant or  trying to get pregnant -breast-feeding How should I use this medicine? This medicine is for injection under the skin or into a muscle. You will be taught how to prepare and give this medicine. Use exactly as directed. Take your medicine at regular intervals. Do not take your medicine more often than directed. It is important that you put your used needles and syringes in a special sharps container. Do not put them in a trash can. If you do not have a sharps container, call your pharmacist or healthcare provider to get one. A special MedGuide will be given to you by the pharmacist with each prescription and refill. Be sure to read this information carefully each time. Talk to your pediatrician regarding the use of this medicine in children. While this medicine may be prescribed for children as young as 8 years for selected conditions, precautions do apply. Overdosage: If you think you have taken too much of this medicine contact a poison control center or emergency room at once. NOTE: This medicine is only for you. Do not share this medicine with others. What if I miss a dose? If you miss a dose, take it as soon as you can. If it is almost time for your next dose, take only that dose. Do not take double or extra doses. What may interact with this medicine? Do not take this medicine with any of the following medications: -chasteberry This medicine may also interact with the following medications: -herbal or dietary supplements, like black cohosh or DHEA -female hormones, like estrogens or progestins and birth control pills, patches, rings, or injections -female hormones, like testosterone This list may not describe all possible interactions. Give your  health care provider a list of all the medicines, herbs, non-prescription drugs, or dietary supplements you use. Also tell them if you smoke, drink alcohol, or use illegal drugs. Some items may interact with your medicine. What should I watch for while  using this medicine? Visit your doctor or health care professional for regular checks on your progress. During the first week, your symptoms may get worse, but then will improve as you continue your treatment. You may get hot flashes, increased bone pain, increased difficulty passing urine, or an aggravation of nerve symptoms. Discuss these effects with your doctor or health care professional, some of them may improve with continued use of this medicine. Female patients may experience a menstrual cycle or spotting during the first 2 months of therapy with this medicine. If this continues, contact your doctor or health care professional. What side effects may I notice from receiving this medicine? Side effects that you should report to your doctor or health care professional as soon as possible: -allergic reactions like skin rash, itching or hives, swelling of the face, lips, or tongue -breathing problems -chest pain -depression or memory disorders -pain in your legs or groin -pain at site where injected -severe headache -swelling of the feet and legs -visual changes -vomiting Side effects that usually do not require medical attention (report to your doctor or health care professional if they continue or are bothersome): -breast swelling or tenderness -decrease in sex drive or performance -diarrhea -hot flashes -loss of appetite -muscle, joint, or bone pains -nausea -redness or irritation at site where injected -skin problems or acne This list may not describe all possible side effects. Call your doctor for medical advice about side effects. You may report side effects to FDA at 1-800-FDA-1088. Where should I keep my medicine? Keep out of the reach of children. Store below 25 degrees C (77 degrees F). Do not freeze. Protect from light. Do not use if it is not clear or if there are particles present. Throw away any unused medicine after the expiration date. NOTE: This sheet is a summary.  It may not cover all possible information. If you have questions about this medicine, talk to your doctor, pharmacist, or health care provider.  2019 Elsevier/Gold Standard (2015-12-02 10:54:35)  Coronavirus (COVID-19) Are you at risk?  Are you at risk for the Coronavirus (COVID-19)?  To be considered HIGH RISK for Coronavirus (COVID-19), you have to meet the following criteria:  . Traveled to [place], [place], [place], [place] or [place]; or in the [place] to [place], [place], [place], or [place]; and have fever, cough, and shortness of breath within the last 2 weeks of travel OR . Been in close contact with a person diagnosed with COVID-19 within the last 2 weeks and have fever, cough, and shortness of breath . IF YOU DO NOT MEET THESE CRITERIA, YOU ARE CONSIDERED LOW RISK FOR COVID-19.  What to do if you are HIGH RISK for COVID-19?  Marland Kitchen If you are having a medical emergency, call 911. . Seek medical care right away. Before you go to a doctor's office, urgent care or emergency department, call ahead and tell them about your recent travel, contact with someone diagnosed with COVID-19, and your symptoms. You should receive instructions from your physician's office regarding next steps of care.  . When you arrive at healthcare provider, tell the healthcare staff immediately you have returned from visiting [place], [place], [place], [place] or [place]; or traveled in [place]  to [place], [place], [place], or [place]; in the last two weeks or you have been in close contact with a person diagnosed with COVID-19 in the last 2 weeks.   . Tell the health care staff about your symptoms: fever, cough and shortness of breath. . After you have been seen by a medical provider, you will be either: o Tested for (COVID-19) and discharged home on quarantine except to seek medical care if symptoms worsen, and asked to  - Stay home and avoid contact with others until you get your results  (4-5 days)  - Avoid travel on public transportation if possible (such as bus, train, or airplane) or o Sent to the Emergency Department by EMS for evaluation, COVID-19 testing, and possible admission depending on your condition and test results.  What to do if you are LOW RISK for COVID-19?  Reduce your risk of any infection by using the same precautions used for avoiding the common cold or flu:  Marland Kitchen Wash your hands often with soap and warm water for at least 20 seconds.  If soap and water are not readily available, use an alcohol-based hand sanitizer with at least 60% alcohol.  . If coughing or sneezing, cover your mouth and nose by coughing or sneezing into the elbow areas of your shirt or coat, into a tissue or into your sleeve (not your hands). . Avoid shaking hands with others and consider head nods or verbal greetings only. . Avoid touching your eyes, nose, or mouth with unwashed hands.  . Avoid close contact with people who are sick. . Avoid places or events with large numbers of people in one location, like concerts or sporting events. . Carefully consider travel plans you have or are making. . If you are planning any travel outside or inside the [place], visit the CDC's Travelers' Health webpage for the latest health notices. . If you have some symptoms but not all symptoms, continue to monitor at home and seek medical attention if your symptoms worsen. . If you are having a medical emergency, call 911.   [place] / e-Visit: [email or web address]         [facility]: [facility]: [phone]                   [facility]: [phone]

## 2018-08-09 NOTE — Progress Notes (Signed)
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  Cigar smoker We discussed the importance of nicotine cessation The patient is motivated to quit on her own   No orders of the defined types were placed in this encounter.   INTERVAL HISTORY: Please see below for problem oriented charting. 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    05/07/2014 Imaging    CT scan of the chest show right thoracic inlet lymphadenopathy    06/07/2014 Procedure    She underwent ultrasound-guided core biopsy of the neck lymph node    06/07/2014 Pathology Results    Accession: [id number] biopsy confirmed diagnosis of Hodgkin lymphoma.    06/15/2014 Imaging    Echocardiogram showed preserved ejection  fraction    07/09/2014 - 07/12/2014 Hospital Admission    She was admitted to the hospital for severe anemia.    07/27/2014 Procedure    She had placement of port    07/31/2014 - 09/11/2014 Chemotherapy    She received dose adjusted chemotherapy due to abnormal liver function tests and severe anemia. Treatment was delayed due to noncompliance  and subsequently stopped because the patient failed to keep appointments    01/11/2015 Imaging    Repeat PET CT scan showed response to treatment    01/28/2015 - 06/18/2015 Chemotherapy    ABVD was restarted with full dose.    02/08/2015 - 02/10/2015 Hospital Admission    The patient was admitted to the hospital due to pancytopenia and profuse diarrhea. Cultures were negative. She was placed on ciprofloxacin.    02/11/2015 Adverse Reaction    Treatment was placed on hold due to recent infection.    04/11/2015 Imaging    PET CT scan showed near complete response. Incidental finding of an abnormal bone lesion, indeterminate. She is not symptomatic. Recommendation from [facility] to observe    07/11/2015 Imaging    PET CT scan showed abnormal new bone lesions, suggestive of possible disease progression    07/23/2015 Bone Marrow Biopsy    She underwent bone biopsy    07/23/2015 Pathology Results    Accession: [id number]  biopsy was negative for cancer    11/21/2015 Surgery    She had surgery for ectopic pregnancy    01/29/2016 Imaging    Ct chest, abdomen and pelvis showed pelvic and retroperitoneal lymphadenopathy, as above, concerning for residual disease. There is also a mildly  enlarged posterior mediastinal lymph node measuring 11 mm adjacent to the distal descending thoracic aorta. This may represent an additional focus of disease, but is the only finding of concern in the thorax on today's examination. Sclerosis in the right ilium at site of previously noted metabolically active lesion, grossly unchanged. No other definite osseous lesions are identified on  today's examination. Spleen is normal in size and appearance.    02/14/2016 PET scan    Interval disease worsening with new foci of hypermetabolic activity in multiple retroperitoneal and pelvic lymph nodes as well as AP window and left hilar lymph nodes. (Deauville 5). There is also overall worsening of the osseous disease.    05/11/2016 Pathology Results    Diagnosis Lymph node, needle/core biopsy, Left para-aortic retroperitoneal - CLASSICAL HODGKIN LYMPHOMA. - SEE ONCOLOGY TABLE. Microscopic Comment LYMPHOMA Histologic type: Classical Hodgkin lymphoma. Grade (if applicable): N/A Flow cytometry: Not done. Immunohistochemical stains: CD15, CD20, CD3, LCA, PAX-5, CD30 with appropriate controls. Touch preps/imprints: Not performed. Comments: The sections show small needle core biopsy fragments displaying a polymorphous cellular proliferation of small lymphocytes, plasma cells, eosinophils, and large atypical mononuclear and multilobated lymphoid cells with features of Reed-Sternberg cells and variants. This is associated with patchy areas of fibrosis. Immunohistochemical stains were performed and show that the large atypical lymphoid cells are positive for CD30, CD15 and PAX-5 and negative for LCA, CD20, CD3. The small lymphoid cells in the background show a mixture of T and B cells with predominance of T cells. The overall morphologic and histologic features are consistent with classical Hodgkin lymphoma. Further subtyping is challenging in limited small biopsy fragments but the patchy fibrosis suggests nodular sclerosis type.     05/11/2016 Procedure    She underwent CT guided biopsy of retroperitoneal lymph node    05/26/2016 Procedure    Successful placement of a right IJ approach Power Port with ultrasound and fluoroscopic guidance. The catheter is ready for use.    06/02/2016 PET scan    Mixed response to chemotherapy with some lymph nodes decreased in metabolic activity and some lymph  nodes increase metabolic activity. Lymph node stations including mediastinum, periaortic retroperitoneum, and obturator node stations. Activity is remains relatively intense Deauville 4 2. LEFT infrahilar nodule / lymph node with intense metabolic activity decreased from prior. ( Deauville 4 ). 3. 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 Deauville 5    06/03/2016 - 06/05/2016 Hospital Admission    She was admitted to the hospital for cycle 1 of ICE chemotherapy    06/24/2016 - 06/26/2016 Hospital Admission    She received cycle 2 of ICE chemo    07/07/2016 PET scan    Resolution of prior hypermetabolic adenopathy and resolution of prior osseous foci of hypermetabolic activity compatible with essentially complete response to therapy (Deauville 1). 2. Generalized reduced activity in the L4 vertebral body and the type of finding which would typically reflect prior radiation therapy. 3. Stable septated fatty right pelvic lesion, possibly a dermoid or lipoma, not hypermetabolic    12/19/1896 PET scan    Hypermetabolic lesion along the L3 vertebral body and left posterior elements, max SUV 7.8 (Deauville 5). Hypermetabolic lesion along the left inferior pubic ramus, max SUV 4.9 (Deauville 4).  IMPRESSION: Prevascular lymphadenopathy, reflecting nodal recurrence (Deauville 4). Hypermetabolic osseous metastases involving the L3 vertebral body/posterior elements and left inferior pubic ramus (Deauville 4-5). Hypermetabolism along the endometrium, new, possibly reactive/physiologic. Consider pelvic ultrasound and/or endometrial sampling as clinically warranted.  11/04/2016 - 02/24/2017 Chemotherapy    She received Brentuximab    12/11/2016 PET scan    1. Mixed response to therapy within the skeleton. Lesions at L3 is decreased in size and metabolic activity. Residual activity remains above liver ( Deauville 4) 2. Increased activity in the LEFT sacrum with metabolic activity above  liver activity ( Deauville 4 3. Decrease in size and metabolic activity of anterior mediastinal tissue consistent with resolution of thymic hyperplasia or resolution of lymphoma metabolic activity ( Deauville 2). 4. No new lymphadenopathy. Normal spleen and liver.    03/25/2017 PET scan    1. Increased metabolic activity in several normal sized retroperitoneal and right pelvic lymph nodes, Deauville 4. 2. Increase in size and metabolic activity of lesions in the L3 and L4 vertebral bodies and in the left sacrum, Deauville 5. 3. No recurrence of anterior mediastinal abnormal activity. 4. Other imaging findings of potential clinical significance: Chronic ethmoid and left maxillary sinusitis. Paraseptal emphysema at the lung apices. Right ovarian dermoid.    04/06/2017 -  Chemotherapy    The patient had Keytruda    06/09/2017 PET scan    1. Overall significant improvement with reduction in nodal activity. Several retroperitoneal nodes which were previously Deauville 4 are currently Deauville 3. The right common iliac lymph node remains at Deauville 4 with maximum SUV of 3.4, but is improved from prior SUV of 4.3. 2. The bony lesions show the greatest improvement, with the previously Deauville 5 hypermetabolic lesions currently no longer of higher metabolic activity than surrounding bone marrow, currently measuring at Deauville 3. 3. 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 4. No new lesions are identified. 5. Emphysema (ICD10-J43.9).    08/05/2017 Imaging    1. Somewhat complex left adnexal lesion, potentially hemorrhagic cyst measuring 2.9 x 2.8 cm. The possibility of torsion in the left ovary must be considered. Advise correlation with pelvic ultrasound including Doppler assessment to further evaluate.  2.  Right ovarian dermoid, unchanged from recent PET-CT examination.  3. No adenopathy by size criteria evident. Appearance of subcentimeter  mesenteric lymph nodes is stable compared to recent study.  4. No bone lesions appreciable by CT. Areas of abnormal radiotracer uptake in the mid lumbar spine noted on recent PET-CT examination. No destruction 2 or lytic change noted in the lumbar vertebrae currently there.  5. No bowel obstruction. No abscess. No periappendiceal region inflammation.  6.  Small hiatal hernia with fluid in distal esophagus.  7.  Foci of coronary artery calcification, advanced for age.  8.  Spleen normal in size and contour.  9. Small hiatal hernia with mild fluid in the distal esophagus, likely indicative of a degree of reflux.    01/04/2018 Imaging    1. No findings to suggest residual/recurrent lymphoma in the chest, abdomen or pelvis. 2. Right ovarian dermoid slightly larger than prior examinations, currently measuring 3.8 x 2.8 x 3.1 cm. 3. Additional incidental findings, as above.    07/11/2018 Imaging    No findings suspicious for recurrent lymphoma. Spleen is normal in size.  Stable right ovarian dermoid.  4 mm subpleural nodular opacity in the posterior right lower lobe, favoring subpleural atelectasis.     REVIEW OF SYSTEMS:   Constitutional: Denies fevers, chills or abnormal weight loss Eyes: Denies blurriness of vision Ears, nose, mouth, throat, and face: Denies mucositis or sore throat Respiratory: Denies cough, dyspnea or wheezes Cardiovascular: Denies palpitation, chest discomfort or lower extremity swelling Gastrointestinal:  Denies  nausea, heartburn or change in bowel habits Skin: Denies abnormal skin rashes Lymphatics: Denies new lymphadenopathy or easy bruising Neurological:Denies numbness, tingling or new weaknesses Behavioral/Psych: Mood is stable, no new changes  All other systems were reviewed with the patient and are negative.  I have reviewed the past medical history, past surgical history, social history and family history with the patient and they are unchanged  from previous note.  ALLERGIES:  is allergic to temazepam.  MEDICATIONS:  Current Outpatient Medications  Medication Sig Dispense Refill  . atazanavir (REYATAZ) 300 MG capsule TAKE ONE CAPSULE BY MOUTH DAILY WITH BREAKFAST. TAKE WITH NORVIR 90 capsule 1  . cholecalciferol (VITAMIN D) 1000 units tablet Take 1 tablet (1,000 Units total) by mouth daily. 30 tablet 9  . Clotrimazole 1 % OINT Apply 1 application topically 2 (two) times daily. 56.7 g 1  . emtricitabine-tenofovir (TRUVADA) 200-300 MG tablet Take 1 tablet by mouth daily. 90 tablet 1  . fluticasone (FLONASE) 50 MCG/ACT nasal spray SHAKE LIQUID AND USE 1 SPRAY IN EACH NOSTRIL DAILY 16 g 0  . hydrocortisone (PROCTOCORT) 1 % CREA Apply 1 application topically daily. 28.4 g 5  . hydrocortisone 2.5 % cream Apply topically daily. 28 g 0  . mirtazapine (REMERON) 15 MG tablet Take 1 tablet (15 mg total) by mouth at bedtime. 30 tablet 11  . ondansetron (ZOFRAN ODT) 8 MG disintegrating tablet Take 1 tablet (8 mg total) by mouth every 8 (eight) hours as needed for nausea. 30 tablet 11  . ritonavir (NORVIR) 100 MG TABS tablet TAKE 1 TABLET BY MOUTH EVERY MORNING WITH BREAKFAST. TAKE WITH REYATAZ 90 tablet 1  . traMADol (ULTRAM) 50 MG tablet Take 2 tablets (100 mg total) by mouth every 6 (six) hours as needed for moderate pain. 90 tablet 0   No current facility-administered medications for this visit.    Facility-Administered Medications Ordered in Other Visits  Medication Dose Route Frequency Provider Last Rate Last Dose  . heparin lock flush 100 unit/mL  500 Units Intracatheter Once PRN Alvy Bimler, Homer Pfeifer, MD      . leuprolide (LUPRON) injection 7.5 mg  7.5 mg Intramuscular Once Alvy Bimler, Oriya Kettering, MD      . pembrolizumab (KEYTRUDA) 200 mg in sodium chloride 0.9 % 50 mL chemo infusion  200 mg Intravenous Once Choice Kleinsasser, MD      . sodium chloride flush (NS) 0.9 % injection 10 mL  10 mL Intracatheter PRN Alvy Bimler, Harlyn Italiano, MD        PHYSICAL EXAMINATION: ECOG  PERFORMANCE STATUS: 1 - Symptomatic but completely ambulatory  Vitals:   08/09/18 0835  BP: 126/84  Pulse: 63  Resp: 18  Temp: 98.2 F (36.8 C)  SpO2: 100%   Filed Weights   08/09/18 0835  Weight: 108 lb 12.8 oz (49.4 kg)    GENERAL:alert, no distress and comfortable SKIN: skin color, texture, turgor are normal, no rashes or significant lesions EYES: normal, Conjunctiva are pink and non-injected, sclera clear OROPHARYNX:no exudate, no erythema and lips, buccal mucosa, and tongue normal  NECK: supple, thyroid normal size, non-tender, without nodularity LYMPH:  no palpable lymphadenopathy in the cervical, axillary or inguinal LUNGS: clear to auscultation and percussion with normal breathing effort HEART: regular rate & rhythm and no murmurs and no lower extremity edema ABDOMEN:abdomen soft, non-tender and normal bowel sounds Musculoskeletal:no cyanosis of digits and no clubbing  NEURO: alert & oriented x 3 with fluent speech, no focal motor/sensory deficits  LABORATORY DATA:  I have reviewed the  data as listed    Component Value Date/Time   NA 141 08/09/2018 0815   NA 140 04/29/2017 0745   K 3.7 08/09/2018 0815   K 3.3 (L) 04/29/2017 0745   CL 107 08/09/2018 0815   CO2 22 08/09/2018 0815   CO2 23 04/29/2017 0745   GLUCOSE 85 08/09/2018 0815   GLUCOSE 83 04/29/2017 0745   BUN 14 08/09/2018 0815   BUN 10.7 04/29/2017 0745   CREATININE 0.99 08/09/2018 0815   CREATININE 1.2 (H) 04/29/2017 0745   CALCIUM 9.1 08/09/2018 0815   CALCIUM 9.8 04/29/2017 0745   PROT 7.7 08/09/2018 0815   PROT 7.6 04/29/2017 0745   ALBUMIN 4.2 08/09/2018 0815   ALBUMIN 4.0 04/29/2017 0745   AST 16 08/09/2018 0815   AST 19 04/29/2017 0745   ALT 12 08/09/2018 0815   ALT 19 04/29/2017 0745   ALKPHOS 129 (H) 08/09/2018 0815   ALKPHOS 81 04/29/2017 0745   BILITOT 0.8 08/09/2018 0815   BILITOT 1.31 (H) 04/29/2017 0745   GFRNONAA >60 08/09/2018 0815   GFRNONAA 83 02/20/2016 0908   GFRAA >60  08/09/2018 0815   GFRAA >89 02/20/2016 0908    No results found for: SPEP, UPEP  Lab Results  Component Value Date   WBC 11.8 (H) 08/09/2018   NEUTROABS 8.4 (H) 08/09/2018   HGB 13.7 08/09/2018   HCT 40.0 08/09/2018   MCV 95.7 08/09/2018   PLT 250 08/09/2018      Chemistry      Component Value Date/Time   NA 141 08/09/2018 0815   NA 140 04/29/2017 0745   K 3.7 08/09/2018 0815   K 3.3 (L) 04/29/2017 0745   CL 107 08/09/2018 0815   CO2 22 08/09/2018 0815   CO2 23 04/29/2017 0745   BUN 14 08/09/2018 0815   BUN 10.7 04/29/2017 0745   CREATININE 0.99 08/09/2018 0815   CREATININE 1.2 (H) 04/29/2017 0745      Component Value Date/Time   CALCIUM 9.1 08/09/2018 0815   CALCIUM 9.8 04/29/2017 0745   ALKPHOS 129 (H) 08/09/2018 0815   ALKPHOS 81 04/29/2017 0745   AST 16 08/09/2018 0815   AST 19 04/29/2017 0745   ALT 12 08/09/2018 0815   ALT 19 04/29/2017 0745   BILITOT 0.8 08/09/2018 0815   BILITOT 1.31 (H) 04/29/2017 0745       RADIOGRAPHIC STUDIES: I have personally reviewed the radiological images as listed and agreed with the findings in the report. Ct Chest W Contrast  Result Date: 07/11/2018 CLINICAL DATA:  Hodgkin's lymphoma, diagnosed 2016, chemotherapy ongoing EXAM: CT CHEST, ABDOMEN, AND PELVIS WITH CONTRAST TECHNIQUE: Multidetector CT imaging of the chest, abdomen and pelvis was performed following the standard protocol during bolus administration of intravenous contrast. CONTRAST:  135m OMNIPAQUE IOHEXOL 300 MG/ML  SOLN COMPARISON:  01/04/2018 FINDINGS: CT CHEST FINDINGS Cardiovascular: The heart is normal in size. No pericardial effusion. No evidence of thoracic aortic aneurysm. Right chest port terminates at the cavoatrial junction. Mediastinum/Nodes: No suspicious mediastinal, hilar, or axillary lymphadenopathy. Prominent triangular anterior mediastinal soft tissue (series 2/image 27), reflecting thymic hyperplasia. Visualized thyroid is unremarkable. Lungs/Pleura:  Mild paraseptal emphysematous changes, upper lobe predominant. 4 mm triangular subpleural nodule in the posterior right lower lobe (series 6/image 84), new, favoring subpleural atelectasis. 3 mm subpleural right middle lobe nodule (series 6/image 81), unchanged, benign. No suspicious pulmonary nodules. No focal consolidation. No pleural effusion or pneumothorax. Musculoskeletal: Mild superior endplate changes at TR9-1(sagittal image 88). CT ABDOMEN PELVIS  FINDINGS Hepatobiliary: Liver is within normal limits. Gallbladder is unremarkable. No intrahepatic or extrahepatic ductal dilatation. Pancreas: Within normal limits. Spleen: Spleen is normal in size. Adrenals/Urinary Tract: Adrenal glands are within normal limits. Kidneys are within normal limits.  No hydronephrosis. Bladder is mildly thick-walled. Stomach/Bowel: Stomach is within normal limits. No evidence of bowel obstruction. Appendix is not discretely visualized. Vascular/Lymphatic: No evidence of abdominal aortic aneurysm. No suspicious abdominopelvic lymphadenopathy. Reproductive: Uterus and left ovary are within normal limits. 3.7 cm right ovarian dermoid (series 2/image 107), unchanged, benign. Other: No abdominopelvic ascites. Musculoskeletal: Visualized osseous structures are within normal limits. IMPRESSION: No findings suspicious for recurrent lymphoma. Spleen is normal in size. Stable right ovarian dermoid. 4 mm subpleural nodular opacity in the posterior right lower lobe, favoring subpleural atelectasis. Electronically Signed   By: Julian Hy M.D.   On: 07/11/2018 13:31   Ct Abdomen Pelvis W Contrast  Result Date: 07/11/2018 CLINICAL DATA:  Hodgkin's lymphoma, diagnosed 2016, chemotherapy ongoing EXAM: CT CHEST, ABDOMEN, AND PELVIS WITH CONTRAST TECHNIQUE: Multidetector CT imaging of the chest, abdomen and pelvis was performed following the standard protocol during bolus administration of intravenous contrast. CONTRAST:  140m OMNIPAQUE  IOHEXOL 300 MG/ML  SOLN COMPARISON:  01/04/2018 FINDINGS: CT CHEST FINDINGS Cardiovascular: The heart is normal in size. No pericardial effusion. No evidence of thoracic aortic aneurysm. Right chest port terminates at the cavoatrial junction. Mediastinum/Nodes: No suspicious mediastinal, hilar, or axillary lymphadenopathy. Prominent triangular anterior mediastinal soft tissue (series 2/image 27), reflecting thymic hyperplasia. Visualized thyroid is unremarkable. Lungs/Pleura: Mild paraseptal emphysematous changes, upper lobe predominant. 4 mm triangular subpleural nodule in the posterior right lower lobe (series 6/image 84), new, favoring subpleural atelectasis. 3 mm subpleural right middle lobe nodule (series 6/image 81), unchanged, benign. No suspicious pulmonary nodules. No focal consolidation. No pleural effusion or pneumothorax. Musculoskeletal: Mild superior endplate changes at TK3-5(sagittal image 88). CT ABDOMEN PELVIS FINDINGS Hepatobiliary: Liver is within normal limits. Gallbladder is unremarkable. No intrahepatic or extrahepatic ductal dilatation. Pancreas: Within normal limits. Spleen: Spleen is normal in size. Adrenals/Urinary Tract: Adrenal glands are within normal limits. Kidneys are within normal limits.  No hydronephrosis. Bladder is mildly thick-walled. Stomach/Bowel: Stomach is within normal limits. No evidence of bowel obstruction. Appendix is not discretely visualized. Vascular/Lymphatic: No evidence of abdominal aortic aneurysm. No suspicious abdominopelvic lymphadenopathy. Reproductive: Uterus and left ovary are within normal limits. 3.7 cm right ovarian dermoid (series 2/image 107), unchanged, benign. Other: No abdominopelvic ascites. Musculoskeletal: Visualized osseous structures are within normal limits. IMPRESSION: No findings suspicious for recurrent lymphoma. Spleen is normal in size. Stable right ovarian dermoid. 4 mm subpleural nodular opacity in the posterior right lower lobe,  favoring subpleural atelectasis. Electronically Signed   By: SJulian HyM.D.   On: 07/11/2018 13:31    All questions were answered. The patient knows to call the clinic with any problems, questions or concerns. No barriers to learning was detected.  I spent 15 minutes counseling the patient face to face. The total time spent in the appointment was 20 minutes and more than 50% was on counseling and review of test results  NHeath Lark MD 08/09/2018 9:19 AM

## 2018-08-09 NOTE — Assessment & Plan Note (Signed)
Her recent CT scan showed no evidence of disease She will continue pembrolizumab indefinitely I plan to repeat imaging study again due around September

## 2018-08-15 ENCOUNTER — Telehealth: Payer: Self-pay | Admitting: *Deleted

## 2018-08-15 ENCOUNTER — Other Ambulatory Visit: Payer: Self-pay | Admitting: Hematology and Oncology

## 2018-08-15 DIAGNOSIS — B20 Human immunodeficiency virus [HIV] disease: Secondary | ICD-10-CM

## 2018-08-15 MED ORDER — TRAMADOL HCL 50 MG PO TABS: 100.0000 mg | ORAL_TABLET | Freq: Four times a day (QID) | ORAL | 0 refills | Status: DC | PRN

## 2018-08-15 NOTE — Telephone Encounter (Signed)
Patient called requesting a refill of her Tramadol sent to [facility] [address].

## 2018-08-15 NOTE — Telephone Encounter (Signed)
done

## 2018-08-17 ENCOUNTER — Telehealth: Payer: Self-pay

## 2018-08-17 NOTE — Telephone Encounter (Signed)
Pt lvm to call her back. Call returned to number provided.  No answer, no vm.

## 2018-08-24 ENCOUNTER — Telehealth: Payer: Self-pay

## 2018-08-24 ENCOUNTER — Other Ambulatory Visit: Payer: Self-pay | Admitting: Hematology and Oncology

## 2018-08-24 DIAGNOSIS — B20 Human immunodeficiency virus [HIV] disease: Secondary | ICD-10-CM

## 2018-08-24 MED ORDER — TRAMADOL HCL 50 MG PO TABS: 100.0000 mg | ORAL_TABLET | Freq: Four times a day (QID) | ORAL | 0 refills | Status: DC | PRN

## 2018-08-24 NOTE — Telephone Encounter (Signed)
She called and left a message requesting refill on Tramadol. Ask that it be sent to [facility] pharmacy.

## 2018-08-24 NOTE — Telephone Encounter (Signed)
done

## 2018-09-02 ENCOUNTER — Other Ambulatory Visit: Payer: Self-pay | Admitting: Hematology and Oncology

## 2018-09-02 ENCOUNTER — Telehealth: Payer: Self-pay | Admitting: *Deleted

## 2018-09-02 DIAGNOSIS — B20 Human immunodeficiency virus [HIV] disease: Secondary | ICD-10-CM

## 2018-09-02 MED ORDER — TRAMADOL HCL 50 MG PO TABS: 100.0000 mg | ORAL_TABLET | Freq: Four times a day (QID) | ORAL | 0 refills | Status: DC | PRN

## 2018-09-02 NOTE — Telephone Encounter (Signed)
Done

## 2018-09-02 NOTE — Telephone Encounter (Signed)
Patient is calling to request a refill on Tramadol please.

## 2018-09-06 ENCOUNTER — Inpatient Hospital Stay: Payer: 59 | Attending: Hematology and Oncology | Admitting: Hematology and Oncology

## 2018-09-06 ENCOUNTER — Encounter: Payer: Self-pay | Admitting: Hematology and Oncology

## 2018-09-06 ENCOUNTER — Other Ambulatory Visit: Payer: Self-pay

## 2018-09-06 ENCOUNTER — Inpatient Hospital Stay: Payer: 59

## 2018-09-06 DIAGNOSIS — Z79899 Other long term (current) drug therapy: Secondary | ICD-10-CM | POA: Diagnosis not present

## 2018-09-06 DIAGNOSIS — C8118 Nodular sclerosis classical Hodgkin lymphoma, lymph nodes of multiple sites: Secondary | ICD-10-CM | POA: Insufficient documentation

## 2018-09-06 DIAGNOSIS — Z5112 Encounter for antineoplastic immunotherapy: Secondary | ICD-10-CM | POA: Insufficient documentation

## 2018-09-06 DIAGNOSIS — C8198 Hodgkin lymphoma, unspecified, lymph nodes of multiple sites: Secondary | ICD-10-CM

## 2018-09-06 DIAGNOSIS — M1990 Unspecified osteoarthritis, unspecified site: Secondary | ICD-10-CM

## 2018-09-06 DIAGNOSIS — Z95828 Presence of other vascular implants and grafts: Secondary | ICD-10-CM

## 2018-09-06 DIAGNOSIS — Z5111 Encounter for antineoplastic chemotherapy: Secondary | ICD-10-CM

## 2018-09-06 DIAGNOSIS — B20 Human immunodeficiency virus [HIV] disease: Secondary | ICD-10-CM

## 2018-09-06 LAB — CBC WITH DIFFERENTIAL/PLATELET
Abs Immature Granulocytes: 0.01 10*3/uL (ref 0.00–0.07)
Basophils Absolute: 0.1 10*3/uL (ref 0.0–0.1)
Basophils Relative: 1 %
Eosinophils Absolute: 0.1 10*3/uL (ref 0.0–0.5)
Eosinophils Relative: 2 %
HCT: 41.3 % (ref 36.0–46.0)
Hemoglobin: 14 g/dL (ref 12.0–15.0)
Immature Granulocytes: 0 %
Lymphocytes Relative: 36 %
Lymphs Abs: 2.2 10*3/uL (ref 0.7–4.0)
MCH: 33 pg (ref 26.0–34.0)
MCHC: 33.9 g/dL (ref 30.0–36.0)
MCV: 97.4 fL (ref 80.0–100.0)
Monocytes Absolute: 0.4 10*3/uL (ref 0.1–1.0)
Monocytes Relative: 7 %
Neutro Abs: 3.4 10*3/uL (ref 1.7–7.7)
Neutrophils Relative %: 54 %
Platelets: 258 10*3/uL (ref 150–400)
RBC: 4.24 MIL/uL (ref 3.87–5.11)
RDW: 13.5 % (ref 11.5–15.5)
WBC: 6.1 10*3/uL (ref 4.0–10.5)
nRBC: 0 % (ref 0.0–0.2)

## 2018-09-06 LAB — COMPREHENSIVE METABOLIC PANEL
ALT: 18 U/L (ref 0–44)
AST: 19 U/L (ref 15–41)
Albumin: 4.2 g/dL (ref 3.5–5.0)
Alkaline Phosphatase: 129 U/L — ABNORMAL HIGH (ref 38–126)
Anion gap: 11 (ref 5–15)
BUN: 12 mg/dL (ref 6–20)
CO2: 26 mmol/L (ref 22–32)
Calcium: 9.9 mg/dL (ref 8.9–10.3)
Chloride: 104 mmol/L (ref 98–111)
Creatinine, Ser: 1.02 mg/dL — ABNORMAL HIGH (ref 0.44–1.00)
GFR calc Af Amer: >60 mL/min (ref >60)
GFR calc non Af Amer: >60 mL/min (ref >60)
Glucose, Bld: 90 mg/dL (ref 70–99)
Potassium: 3.5 mmol/L (ref 3.5–5.1)
Sodium: 141 mmol/L (ref 135–145)
Total Bilirubin: 0.9 mg/dL (ref 0.3–1.2)
Total Protein: 7.3 g/dL (ref 6.5–8.1)

## 2018-09-06 LAB — TSH: TSH: 1.847 u[IU]/mL (ref 0.308–3.960)

## 2018-09-06 MED ORDER — HEPARIN SOD (PORK) LOCK FLUSH 100 UNIT/ML IV SOLN
500.0000 [IU] | Freq: Once | INTRAVENOUS | Status: AC | PRN
  Administered 2018-09-06: 500 [IU]
  Filled 2018-09-06: qty 5

## 2018-09-06 MED ORDER — SODIUM CHLORIDE 0.9 % IV SOLN
Freq: Once | INTRAVENOUS | Status: AC
  Administered 2018-09-06: 09:00:00 via INTRAVENOUS
  Filled 2018-09-06: qty 250

## 2018-09-06 MED ORDER — LEUPROLIDE ACETATE 7.5 MG IM KIT
7.5000 mg | PACK | Freq: Once | INTRAMUSCULAR | Status: AC
  Administered 2018-09-06: 7.5 mg via INTRAMUSCULAR
  Filled 2018-09-06: qty 7.5

## 2018-09-06 MED ORDER — SODIUM CHLORIDE 0.9% FLUSH
10.0000 mL | INTRAVENOUS | Status: DC | PRN
  Administered 2018-09-06: 10 mL
  Filled 2018-09-06: qty 10

## 2018-09-06 MED ORDER — SODIUM CHLORIDE 0.9% FLUSH
10.0000 mL | INTRAVENOUS | Status: DC | PRN
  Administered 2018-09-06: 11:00:00 10 mL
  Filled 2018-09-06: qty 10

## 2018-09-06 MED ORDER — SODIUM CHLORIDE 0.9 % IV SOLN
200.0000 mg | Freq: Once | INTRAVENOUS | Status: AC
  Administered 2018-09-06: 10:00:00 200 mg via INTRAVENOUS
  Filled 2018-09-06: qty 8

## 2018-09-06 NOTE — Assessment & Plan Note (Signed)
Her recent CT scan showed no evidence of disease She will continue pembrolizumab indefinitely I plan to repeat imaging study again due around September

## 2018-09-06 NOTE — Progress Notes (Signed)
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  Inflammatory arthritis She has multiple chronic joint pain and is dependent on tramadol to control her joint pain She is taking vitamin D supplement as prescribed We discussed refill policy.   No orders of the defined types were placed in this encounter.   INTERVAL HISTORY: Please see below for problem oriented charting. 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    05/07/2014 Imaging    CT scan of the chest show right thoracic inlet lymphadenopathy    06/07/2014 Procedure    She underwent ultrasound-guided core biopsy of the neck lymph node    06/07/2014 Pathology Results    Accession: [id number] biopsy confirmed diagnosis of Hodgkin lymphoma.    06/15/2014 Imaging    Echocardiogram showed preserved  ejection fraction    07/09/2014 - 07/12/2014 Hospital Admission    She was admitted to the hospital for severe anemia.    07/27/2014 Procedure    She had placement of port    07/31/2014 - 09/11/2014 Chemotherapy    She received dose adjusted chemotherapy due to abnormal liver function tests and severe anemia. Treatment was delayed due to noncompliance  and subsequently stopped because the patient failed to keep appointments    01/11/2015 Imaging    Repeat PET CT scan showed response to treatment    01/28/2015 - 06/18/2015 Chemotherapy    ABVD was restarted with full dose.    02/08/2015 - 02/10/2015 Hospital Admission    The patient was admitted to the hospital due to pancytopenia and profuse diarrhea. Cultures were negative. She was placed on ciprofloxacin.    02/11/2015 Adverse Reaction    Treatment was placed on hold due to recent infection.    04/11/2015 Imaging    PET CT scan showed near complete response. Incidental finding of an abnormal bone lesion, indeterminate. She is not symptomatic. Recommendation from [facility] to observe    07/11/2015 Imaging    PET CT scan showed abnormal new bone lesions, suggestive of possible disease progression    07/23/2015 Bone Marrow Biopsy    She underwent bone biopsy    07/23/2015 Pathology Results    Accession: [id number]  biopsy was negative for cancer    11/21/2015 Surgery    She had surgery for ectopic pregnancy    01/29/2016 Imaging    Ct chest, abdomen and pelvis showed pelvic and retroperitoneal lymphadenopathy, as above, concerning for residual disease. There is also a mildly enlarged posterior mediastinal lymph node  measuring 11 mm adjacent to the distal descending thoracic aorta. This may represent an additional focus of disease, but is the only finding of concern in the thorax on today's examination. Sclerosis in the right ilium at site of previously noted metabolically active lesion, grossly unchanged. No other definite osseous lesions are  identified on today's examination. Spleen is normal in size and appearance.    02/14/2016 PET scan    Interval disease worsening with new foci of hypermetabolic activity in multiple retroperitoneal and pelvic lymph nodes as well as AP window and left hilar lymph nodes. ([place]auville 5). There is also overall worsening of the osseous disease.    05/11/2016 Pathology Results    Diagnosis Lymph node, needle/core biopsy, Left para-aortic retroperitoneal - CLASSICAL HODGKIN LYMPHOMA. - SEE ONCOLOGY TABLE. Microscopic Comment LYMPHOMA Histologic type: Classical Hodgkin lymphoma. Grade (if applicable): N/A Flow cytometry: Not done. Immunohistochemical stains: CD15, CD20, CD3, LCA, PAX-5, CD30 with appropriate controls. Touch preps/imprints: Not performed. Comments: The sections show small needle core biopsy fragments displaying a polymorphous cellular proliferation of small lymphocytes, plasma cells, eosinophils, and large atypical mononuclear and multilobated lymphoid cells with features of Reed-Sternberg cells and variants. This is associated with patchy areas of fibrosis. Immunohistochemical stains were performed and show that the large atypical lymphoid cells are positive for CD30, CD15 and PAX-5 and negative for LCA, CD20, CD3. The small lymphoid cells in the background show a mixture of T and B cells with predominance of T cells. The overall morphologic and histologic features are consistent with classical Hodgkin lymphoma. Further subtyping is challenging in limited small biopsy fragments but the patchy fibrosis suggests nodular sclerosis type.     05/11/2016 Procedure    She underwent CT guided biopsy of retroperitoneal lymph node    05/26/2016 Procedure    Successful placement of a right IJ approach Power Port with ultrasound and fluoroscopic guidance. The catheter is ready for use.    06/02/2016 PET scan    Mixed response to chemotherapy with some lymph nodes decreased in metabolic activity and  some lymph nodes increase metabolic activity. Lymph node stations including mediastinum, periaortic retroperitoneum, and obturator node stations. Activity is remains relatively intense Deauville 4 2. LEFT infrahilar nodule / lymph node with intense metabolic activity decreased from prior. ( [place]auville 4 ). 3. 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 Deauville 5    06/03/2016 - 06/05/2016 Hospital Admission    She was admitted to the hospital for cycle 1 of ICE chemotherapy    06/24/2016 - 06/26/2016 Hospital Admission    She received cycle 2 of ICE chemo    07/07/2016 PET scan    Resolution of prior hypermetabolic adenopathy and resolution of prior osseous foci of hypermetabolic activity compatible with essentially complete response to therapy ([place]au[place] 1). 2. Generalized reduced activity in the L4 vertebral body and the type of finding which would typically reflect prior radiation therapy. 3. Stable septated fatty right pelvic lesion, possibly a dermoid or lipoma, not hypermetabolic    0/48/8891 PET scan    Hypermetabolic lesion along the L3 vertebral body and left posterior elements, max SUV 7.8 (Deauville 5). Hypermetabolic lesion along the left inferior pubic ramus, max SUV 4.9 (Deauville 4).  IMPRESSION: Prevascular lymphadenopathy, reflecting nodal recurrence (Deauville 4). Hypermetabolic osseous metastases involving the L3 vertebral body/posterior elements and left inferior pubic ramus (Deauville 4-5). Hypermetabolism along the endometrium, new, possibly reactive/physiologic. Consider pelvic ultrasound and/or endometrial sampling as clinically warranted.    11/04/2016 - 02/24/2017 Chemotherapy  She received Brentuximab    12/11/2016 PET scan    1. Mixed response to therapy within the skeleton. Lesions at L3 is decreased in size and metabolic activity. Residual activity remains above liver ( [place]auville 4) 2. Increased activity in the LEFT sacrum with metabolic  activity above liver activity ( [place]auville 4 3. Decrease in size and metabolic activity of anterior mediastinal tissue consistent with resolution of thymic hyperplasia or resolution of lymphoma metabolic activity ( Deauville 2). 4. No new lymphadenopathy. Normal spleen and liver.    03/25/2017 PET scan    1. Increased metabolic activity in several normal sized retroperitoneal and right pelvic lymph nodes, Deauville 4. 2. Increase in size and metabolic activity of lesions in the L3 and L4 vertebral bodies and in the left sacrum, Deauville 5. 3. No recurrence of anterior mediastinal abnormal activity. 4. Other imaging findings of potential clinical significance: Chronic ethmoid and left maxillary sinusitis. Paraseptal emphysema at the lung apices. Right ovarian dermoid.    04/06/2017 -  Chemotherapy    The patient had Keytruda    06/09/2017 PET scan    1. Overall significant improvement with reduction in nodal activity. Several retroperitoneal nodes which were previously Deauville 4 are currently [place]auville 3. The right common iliac lymph node remains at Deauville 4 with maximum SUV of 3.4, but is improved from prior SUV of 4.3. 2. The bony lesions show the greatest improvement, with the previously Deauville 5 hypermetabolic lesions currently no longer of higher metabolic activity than surrounding bone marrow, currently measuring at [place]auville 3. 3. 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 4. No new lesions are identified. 5. Emphysema (ICD10-J43.9).    08/05/2017 Imaging    1. Somewhat complex left adnexal lesion, potentially hemorrhagic cyst measuring 2.9 x 2.8 cm. The possibility of torsion in the left ovary must be considered. Advise correlation with pelvic ultrasound including Doppler assessment to further evaluate.  2.  Right ovarian dermoid, unchanged from recent PET-CT examination.  3. No adenopathy by size criteria evident. Appearance of  subcentimeter mesenteric lymph nodes is stable compared to recent study.  4. No bone lesions appreciable by CT. Areas of abnormal radiotracer uptake in the mid lumbar spine noted on recent PET-CT examination. No destruction 2 or lytic change noted in the lumbar vertebrae currently there.  5. No bowel obstruction. No abscess. No periappendiceal region inflammation.  6.  Small hiatal hernia with fluid in distal esophagus.  7.  Foci of coronary artery calcification, advanced for age.  8.  Spleen normal in size and contour.  9. Small hiatal hernia with mild fluid in the distal esophagus, likely indicative of a degree of reflux.    01/04/2018 Imaging    1. No findings to suggest residual/recurrent lymphoma in the chest, abdomen or pelvis. 2. Right ovarian dermoid slightly larger than prior examinations, currently measuring 3.8 x 2.8 x 3.1 cm. 3. Additional incidental findings, as above.    07/11/2018 Imaging    No findings suspicious for recurrent lymphoma. Spleen is normal in size.  Stable right ovarian dermoid.  4 mm subpleural nodular opacity in the posterior right lower lobe, favoring subpleural atelectasis.     REVIEW OF SYSTEMS:   Constitutional: Denies fevers, chills or abnormal weight loss Eyes: Denies blurriness of vision Ears, nose, mouth, throat, and face: Denies mucositis or sore throat Respiratory: Denies cough, dyspnea or wheezes Cardiovascular: Denies palpitation, chest discomfort or lower extremity swelling Gastrointestinal:  Denies nausea, heartburn or change in bowel habits  Skin: Denies abnormal skin rashes Lymphatics: Denies new lymphadenopathy or easy bruising Neurological:Denies numbness, tingling or new weaknesses Behavioral/Psych: Mood is stable, no new changes  All other systems were reviewed with the patient and are negative.  I have reviewed the past medical history, past surgical history, social history and family history with the patient and they  are unchanged from previous note.  ALLERGIES:  is allergic to temazepam.  MEDICATIONS:  Current Outpatient Medications  Medication Sig Dispense Refill  . atazanavir (REYATAZ) 300 MG capsule TAKE ONE CAPSULE BY MOUTH DAILY WITH BREAKFAST. TAKE WITH NORVIR 90 capsule 1  . cholecalciferol (VITAMIN D) 1000 units tablet Take 1 tablet (1,000 Units total) by mouth daily. 30 tablet 9  . Clotrimazole 1 % OINT Apply 1 application topically 2 (two) times daily. 56.7 g 1  . emtricitabine-tenofovir (TRUVADA) 200-300 MG tablet Take 1 tablet by mouth daily. 90 tablet 1  . fluticasone (FLONASE) 50 MCG/ACT nasal spray SHAKE LIQUID AND USE 1 SPRAY IN EACH NOSTRIL DAILY 16 g 0  . hydrocortisone (PROCTOCORT) 1 % CREA Apply 1 application topically daily. 28.4 g 5  . hydrocortisone 2.5 % cream Apply topically daily. 28 g 0  . mirtazapine (REMERON) 15 MG tablet Take 1 tablet (15 mg total) by mouth at bedtime. 30 tablet 11  . ondansetron (ZOFRAN ODT) 8 MG disintegrating tablet Take 1 tablet (8 mg total) by mouth every 8 (eight) hours as needed for nausea. 30 tablet 11  . ritonavir (NORVIR) 100 MG TABS tablet TAKE 1 TABLET BY MOUTH EVERY MORNING WITH BREAKFAST. TAKE WITH REYATAZ 90 tablet 1  . traMADol (ULTRAM) 50 MG tablet Take 2 tablets (100 mg total) by mouth every 6 (six) hours as needed for moderate pain. 90 tablet 0   No current facility-administered medications for this visit.    Facility-Administered Medications Ordered in Other Visits  Medication Dose Route Frequency Provider Last Rate Last Dose  . heparin lock flush 100 unit/mL  500 Units Intracatheter Once PRN Alvy Bimler, Corazon Nickolas, MD      . sodium chloride flush (NS) 0.9 % injection 10 mL  10 mL Intracatheter PRN Alvy Bimler, Oma Marzan, MD        PHYSICAL EXAMINATION: ECOG PERFORMANCE STATUS: 1 - Symptomatic but completely ambulatory  Vitals:   09/06/18 0838  BP: 111/75  Pulse: 64  Resp: 18  Temp: 97.7 F (36.5 C)  SpO2: 100%   Filed Weights   09/06/18 0838   Weight: 105 lb 6.4 oz (47.8 kg)    GENERAL:alert, no distress and comfortable SKIN: skin color, texture, turgor are normal, no rashes or significant lesions EYES: normal, Conjunctiva are pink and non-injected, sclera clear OROPHARYNX:no exudate, no erythema and lips, buccal mucosa, and tongue normal  NECK: supple, thyroid normal size, non-tender, without nodularity LYMPH:  no palpable lymphadenopathy in the cervical, axillary or inguinal LUNGS: clear to auscultation and percussion with normal breathing effort HEART: regular rate & rhythm and no murmurs and no lower extremity edema ABDOMEN:abdomen soft, non-tender and normal bowel sounds Musculoskeletal:no cyanosis of digits and no clubbing  NEURO: alert & oriented x 3 with fluent speech, no focal motor/sensory deficits  LABORATORY DATA:  I have reviewed the data as listed    Component Value Date/Time   NA 141 09/06/2018 0812   NA 140 04/29/2017 0745   K 3.5 09/06/2018 0812   K 3.3 (L) 04/29/2017 0745   CL 104 09/06/2018 0812   CO2 26 09/06/2018 0812   CO2 23 04/29/2017 0745  GLUCOSE 90 09/06/2018 0812   GLUCOSE 83 04/29/2017 0745   BUN 12 09/06/2018 0812   BUN 10.7 04/29/2017 0745   CREATININE 1.02 (H) 09/06/2018 0812   CREATININE 1.2 (H) 04/29/2017 0745   CALCIUM 9.9 09/06/2018 0812   CALCIUM 9.8 04/29/2017 0745   PROT 7.3 09/06/2018 0812   PROT 7.6 04/29/2017 0745   ALBUMIN 4.2 09/06/2018 0812   ALBUMIN 4.0 04/29/2017 0745   AST 19 09/06/2018 0812   AST 19 04/29/2017 0745   ALT 18 09/06/2018 0812   ALT 19 04/29/2017 0745   ALKPHOS 129 (H) 09/06/2018 0812   ALKPHOS 81 04/29/2017 0745   BILITOT 0.9 09/06/2018 0812   BILITOT 1.31 (H) 04/29/2017 0745   GFRNONAA >60 09/06/2018 0812   GFRNONAA 83 02/20/2016 0908   GFRAA >60 09/06/2018 0812   GFRAA >89 02/20/2016 0908    No results found for: SPEP, UPEP  Lab Results  Component Value Date   WBC 6.1 09/06/2018   NEUTROABS 3.4 09/06/2018   HGB 14.0 09/06/2018    HCT 41.3 09/06/2018   MCV 97.4 09/06/2018   PLT 258 09/06/2018      Chemistry      Component Value Date/Time   NA 141 09/06/2018 0812   NA 140 04/29/2017 0745   K 3.5 09/06/2018 0812   K 3.3 (L) 04/29/2017 0745   CL 104 09/06/2018 0812   CO2 26 09/06/2018 0812   CO2 23 04/29/2017 0745   BUN 12 09/06/2018 0812   BUN 10.7 04/29/2017 0745   CREATININE 1.02 (H) 09/06/2018 0812   CREATININE 1.2 (H) 04/29/2017 0745      Component Value Date/Time   CALCIUM 9.9 09/06/2018 0812   CALCIUM 9.8 04/29/2017 0745   ALKPHOS 129 (H) 09/06/2018 0812   ALKPHOS 81 04/29/2017 0745   AST 19 09/06/2018 0812   AST 19 04/29/2017 0745   ALT 18 09/06/2018 0812   ALT 19 04/29/2017 0745   BILITOT 0.9 09/06/2018 0812   BILITOT 1.31 (H) 04/29/2017 0745      All questions were answered. The patient knows to call the clinic with any problems, questions or concerns. No barriers to learning was detected.  I spent 15 minutes counseling the patient face to face. The total time spent in the appointment was 20 minutes and more than 50% was on counseling and review of test results  Heath Lark, MD 09/06/2018 10:41 AM

## 2018-09-06 NOTE — Patient Instructions (Signed)
[place] Discharge Instructions for Patients Receiving Chemotherapy  Today you received the following chemotherapy agents Pembrolizumab [facility]).  To help prevent nausea and vomiting after your treatment, we encourage you to take your nausea medication as prescribed.   If you develop nausea and vomiting that is not controlled by your nausea medication, call the clinic.   BELOW ARE SYMPTOMS THAT SHOULD BE REPORTED IMMEDIATELY:  *FEVER GREATER THAN 100.5 F  *CHILLS WITH OR WITHOUT FEVER  NAUSEA AND VOMITING THAT IS NOT CONTROLLED WITH YOUR NAUSEA MEDICATION  *UNUSUAL SHORTNESS OF BREATH  *UNUSUAL BRUISING OR BLEEDING  TENDERNESS IN MOUTH AND THROAT WITH OR WITHOUT PRESENCE OF ULCERS  *URINARY PROBLEMS  *BOWEL PROBLEMS  UNUSUAL RASH Items with * indicate a potential emergency and should be followed up as soon as possible.  Feel free to call the clinic should you have any questions or concerns. The clinic phone number is [phone].  Please show the [place] at check-in to the Emergency Department and triage nurse.  Are you at risk for the Coronavirus (COVID-19)?  To be considered HIGH RISK for Coronavirus (COVID-19), you have to meet the following criteria:  . Traveled to [place], [place], [place], [place] or [place]; or in the [place] to [place], [place], [place], or [place]; and have fever, cough, and shortness of breath within the last 2 weeks of travel OR . Been in close contact with a person diagnosed with COVID-19 within the last 2 weeks and have fever, cough, and shortness of breath . IF YOU DO NOT MEET THESE CRITERIA, YOU ARE CONSIDERED LOW RISK FOR COVID-19.  What to do if you are HIGH RISK for COVID-19?  Marland Kitchen If you are having a medical emergency, call 911. . Seek medical care right away. Before you go to a doctor's office, urgent care or emergency department, call ahead and tell them about your recent travel, contact with  someone diagnosed with COVID-19, and your symptoms. You should receive instructions from your physician's office regarding next steps of care.  . When you arrive at healthcare provider, tell the healthcare staff immediately you have returned from visiting [place], [place], [place], [place] or [place]; or traveled in the [place] to [place], [place], [place], or [place]; in the last two weeks or you have been in close contact with a person diagnosed with COVID-19 in the last 2 weeks.   . Tell the health care staff about your symptoms: fever, cough and shortness of breath. . After you have been seen by a medical provider, you will be either: o Tested for (COVID-19) and discharged home on quarantine except to seek medical care if symptoms worsen, and asked to  - Stay home and avoid contact with others until you get your results (4-5 days)  - Avoid travel on public transportation if possible (such as bus, train, or airplane) or o Sent to the Emergency Department by EMS for evaluation, COVID-19 testing, and possible admission depending on your condition and test results.  What to do if you are LOW RISK for COVID-19?  Reduce your risk of any infection by using the same precautions used for avoiding the common cold or flu:  Marland Kitchen Wash your hands often with soap and warm water for at least 20 seconds.  If soap and water are not readily available, use an alcohol-based hand sanitizer with at least 60% alcohol.  . If coughing or sneezing, cover your mouth and nose by  coughing or sneezing into the elbow areas of your shirt or coat, into a tissue or into your sleeve (not your hands). . Avoid shaking hands with others and consider head nods or verbal greetings only. . Avoid touching your eyes, nose, or mouth with unwashed hands.  . Avoid close contact with people who are sick. . Avoid places or events with large numbers of people in one location, like concerts or sporting events. . Carefully consider  travel plans you have or are making. . If you are planning any travel outside or inside the [place], visit the CDC's Travelers' Health webpage for the latest health notices. . If you have some symptoms but not all symptoms, continue to monitor at home and seek medical attention if your symptoms worsen. . If you are having a medical emergency, call 911.   [place] / e-Visit: [email or web address]         [facility]: [facility]: [phone]                   [facility]: [phone]

## 2018-09-06 NOTE — Assessment & Plan Note (Signed)
She will continue anti-retroviral treatment as directed She will continue close follow-up with infectious disease service.

## 2018-09-06 NOTE — Assessment & Plan Note (Signed)
She has multiple chronic joint pain and is dependent on tramadol to control her joint pain She is taking vitamin D supplement as prescribed We discussed refill policy. 

## 2018-09-09 ENCOUNTER — Telehealth: Payer: Self-pay

## 2018-09-09 NOTE — Telephone Encounter (Signed)
Pt called wanting refill on tramadol. Per last prescription, not due for refill until 09/12/18.  Pt to call back on 09/12/18

## 2018-09-12 ENCOUNTER — Telehealth: Payer: Self-pay | Admitting: *Deleted

## 2018-09-12 ENCOUNTER — Other Ambulatory Visit: Payer: Self-pay | Admitting: Hematology and Oncology

## 2018-09-12 DIAGNOSIS — B20 Human immunodeficiency virus [HIV] disease: Secondary | ICD-10-CM

## 2018-09-12 MED ORDER — TRAMADOL HCL 50 MG PO TABS: 100.0000 mg | ORAL_TABLET | Freq: Four times a day (QID) | ORAL | 0 refills | Status: DC | PRN

## 2018-09-12 NOTE — Telephone Encounter (Signed)
Patient called for a refill of her tramadol please.

## 2018-09-12 NOTE — Telephone Encounter (Signed)
done

## 2018-09-14 ENCOUNTER — Telehealth: Payer: Self-pay | Admitting: Internal Medicine

## 2018-09-14 NOTE — Telephone Encounter (Signed)
COVID-19 Pre-Screening Questions: ° °Do you currently have a fever (>100 °F), chills or unexplained body aches? No  ° °Are you currently experiencing new cough, shortness of breath, sore throat, runny nose? No  °•  °Have you recently travelled outside the state of [place] in the last 14 days? No  °•  °Have you been in contact with someone that is currently pending confirmation of Covid19 testing or has been confirmed to have the Covid19 virus?  No  °

## 2018-09-15 ENCOUNTER — Ambulatory Visit: Payer: 59 | Admitting: Internal Medicine

## 2018-09-18 ENCOUNTER — Ambulatory Visit (HOSPITAL_COMMUNITY)
Admission: EM | Admit: 2018-09-18 | Discharge: 2018-09-18 | Disposition: A | Discharge status: Home / Self Care | Payer: 59 | Attending: Internal Medicine | Admitting: Internal Medicine

## 2018-09-18 ENCOUNTER — Encounter (HOSPITAL_COMMUNITY): Payer: Self-pay | Admitting: Emergency Medicine

## 2018-09-18 DIAGNOSIS — J029 Acute pharyngitis, unspecified: Secondary | ICD-10-CM | POA: Insufficient documentation

## 2018-09-18 LAB — POCT RAPID STREP A: Streptococcus, Group A Screen (Direct): NEGATIVE
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
## 2018-09-18 NOTE — ED Triage Notes (Signed)
Pt c/o sore throat sinvce Friday, states she thinks is thrush but her cancer doctor wanted her to be checked for strep to be sure. Pt had chemo last may 12th.

## 2018-09-20 ENCOUNTER — Telehealth: Payer: Self-pay

## 2018-09-20 LAB — CULTURE, GROUP A STREP (THRC)

## 2018-09-20 NOTE — Telephone Encounter (Signed)
Called and left a message per Dr. Alvy Bimler. She called and left a message after hours about sore throat and was seen at urgent care of 5/24. Left a message instructing her to contact ID physician and ask her to call the office for questions.

## 2018-09-22 ENCOUNTER — Other Ambulatory Visit: Payer: Self-pay | Admitting: Hematology and Oncology

## 2018-09-22 ENCOUNTER — Telehealth: Payer: Self-pay

## 2018-09-22 DIAGNOSIS — B20 Human immunodeficiency virus [HIV] disease: Secondary | ICD-10-CM

## 2018-09-22 MED ORDER — TRAMADOL HCL 50 MG PO TABS: 100.0000 mg | ORAL_TABLET | Freq: Four times a day (QID) | ORAL | 0 refills | Status: DC | PRN

## 2018-09-22 NOTE — Telephone Encounter (Signed)
She called and left a message requesting Tramadol Rx sent to [facility].

## 2018-09-22 NOTE — Telephone Encounter (Signed)
done

## 2018-09-27 ENCOUNTER — Other Ambulatory Visit: Payer: Self-pay | Admitting: Hematology and Oncology

## 2018-10-04 ENCOUNTER — Inpatient Hospital Stay: Payer: 59 | Attending: Hematology and Oncology | Admitting: Hematology and Oncology

## 2018-10-04 ENCOUNTER — Encounter: Payer: Self-pay | Admitting: Hematology and Oncology

## 2018-10-04 ENCOUNTER — Inpatient Hospital Stay: Payer: 59

## 2018-10-04 ENCOUNTER — Other Ambulatory Visit: Payer: Self-pay

## 2018-10-04 VITALS — HR 68 | Resp 16

## 2018-10-04 DIAGNOSIS — Z5111 Encounter for antineoplastic chemotherapy: Secondary | ICD-10-CM

## 2018-10-04 DIAGNOSIS — B20 Human immunodeficiency virus [HIV] disease: Secondary | ICD-10-CM | POA: Diagnosis not present

## 2018-10-04 DIAGNOSIS — G893 Neoplasm related pain (acute) (chronic): Secondary | ICD-10-CM

## 2018-10-04 DIAGNOSIS — C8118 Nodular sclerosis classical Hodgkin lymphoma, lymph nodes of multiple sites: Secondary | ICD-10-CM

## 2018-10-04 DIAGNOSIS — Z79899 Other long term (current) drug therapy: Secondary | ICD-10-CM | POA: Insufficient documentation

## 2018-10-04 DIAGNOSIS — R630 Anorexia: Secondary | ICD-10-CM

## 2018-10-04 DIAGNOSIS — Z5112 Encounter for antineoplastic immunotherapy: Secondary | ICD-10-CM | POA: Diagnosis present

## 2018-10-04 DIAGNOSIS — C8198 Hodgkin lymphoma, unspecified, lymph nodes of multiple sites: Secondary | ICD-10-CM

## 2018-10-04 DIAGNOSIS — F17290 Nicotine dependence, other tobacco product, uncomplicated: Secondary | ICD-10-CM

## 2018-10-04 DIAGNOSIS — Z95828 Presence of other vascular implants and grafts: Secondary | ICD-10-CM

## 2018-10-04 LAB — COMPREHENSIVE METABOLIC PANEL
ALT: 15 U/L (ref 0–44)
AST: 18 U/L (ref 15–41)
Albumin: 4 g/dL (ref 3.5–5.0)
Alkaline Phosphatase: 125 U/L (ref 38–126)
Anion gap: 10 (ref 5–15)
BUN: 16 mg/dL (ref 6–20)
CO2: 24 mmol/L (ref 22–32)
Calcium: 9.4 mg/dL (ref 8.9–10.3)
Chloride: 108 mmol/L (ref 98–111)
Creatinine, Ser: 1.05 mg/dL — ABNORMAL HIGH (ref 0.44–1.00)
GFR calc Af Amer: >60 mL/min (ref >60)
GFR calc non Af Amer: >60 mL/min (ref >60)
Glucose, Bld: 88 mg/dL (ref 70–99)
Potassium: 3.8 mmol/L (ref 3.5–5.1)
Sodium: 142 mmol/L (ref 135–145)
Total Bilirubin: 0.7 mg/dL (ref 0.3–1.2)
Total Protein: 6.9 g/dL (ref 6.5–8.1)

## 2018-10-04 LAB — CBC WITH DIFFERENTIAL/PLATELET
Abs Immature Granulocytes: 0.02 10*3/uL (ref 0.00–0.07)
Basophils Absolute: 0 10*3/uL (ref 0.0–0.1)
Basophils Relative: 1 %
Eosinophils Absolute: 0.2 10*3/uL (ref 0.0–0.5)
Eosinophils Relative: 2 %
HCT: 40.4 % (ref 36.0–46.0)
Hemoglobin: 13.6 g/dL (ref 12.0–15.0)
Immature Granulocytes: 0 %
Lymphocytes Relative: 26 %
Lymphs Abs: 2.2 10*3/uL (ref 0.7–4.0)
MCH: 33.4 pg (ref 26.0–34.0)
MCHC: 33.7 g/dL (ref 30.0–36.0)
MCV: 99.3 fL (ref 80.0–100.0)
Monocytes Absolute: 0.5 10*3/uL (ref 0.1–1.0)
Monocytes Relative: 6 %
Neutro Abs: 5.6 10*3/uL (ref 1.7–7.7)
Neutrophils Relative %: 65 %
Platelets: 227 10*3/uL (ref 150–400)
RBC: 4.07 MIL/uL (ref 3.87–5.11)
RDW: 13.2 % (ref 11.5–15.5)
WBC: 8.5 10*3/uL (ref 4.0–10.5)
nRBC: 0 % (ref 0.0–0.2)

## 2018-10-04 LAB — TSH: TSH: 2.054 u[IU]/mL (ref 0.308–3.960)

## 2018-10-04 MED ORDER — SODIUM CHLORIDE 0.9 % IV SOLN
Freq: Once | INTRAVENOUS | Status: AC
  Administered 2018-10-04: 10:00:00 via INTRAVENOUS
  Filled 2018-10-04: qty 250

## 2018-10-04 MED ORDER — TRAMADOL HCL 50 MG PO TABS: 100.0000 mg | ORAL_TABLET | Freq: Four times a day (QID) | ORAL | 0 refills | Status: DC | PRN

## 2018-10-04 MED ORDER — LEUPROLIDE ACETATE 7.5 MG IM KIT
7.5000 mg | PACK | Freq: Once | INTRAMUSCULAR | Status: AC
  Administered 2018-10-04: 7.5 mg via INTRAMUSCULAR
  Filled 2018-10-04: qty 7.5

## 2018-10-04 MED ORDER — MIRTAZAPINE 30 MG PO TABS: 30.0000 mg | ORAL_TABLET | Freq: Every day | ORAL | 11 refills | Status: DC

## 2018-10-04 MED ORDER — SODIUM CHLORIDE 0.9% FLUSH
10.0000 mL | INTRAVENOUS | Status: DC | PRN
  Administered 2018-10-04: 09:00:00 10 mL
  Filled 2018-10-04: qty 10

## 2018-10-04 MED ORDER — SODIUM CHLORIDE 0.9 % IV SOLN
200.0000 mg | Freq: Once | INTRAVENOUS | Status: AC
  Administered 2018-10-04: 10:00:00 200 mg via INTRAVENOUS
  Filled 2018-10-04: qty 8

## 2018-10-04 MED ORDER — HEPARIN SOD (PORK) LOCK FLUSH 100 UNIT/ML IV SOLN
500.0000 [IU] | Freq: Once | INTRAVENOUS | Status: AC | PRN
  Administered 2018-10-04: 11:00:00 500 [IU]
  Filled 2018-10-04: qty 5

## 2018-10-04 MED ORDER — SODIUM CHLORIDE 0.9% FLUSH
10.0000 mL | INTRAVENOUS | Status: DC | PRN
  Administered 2018-10-04: 10 mL
  Filled 2018-10-04: qty 10

## 2018-10-04 NOTE — Assessment & Plan Note (Signed)
We discussed the importance of nicotine cessation The patient is motivated to quit on her own

## 2018-10-04 NOTE — Progress Notes (Signed)
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  Cigar smoker We discussed the importance of nicotine cessation The patient is motivated to quit on her own  Anorexia She has lost some weight due to inability to eat during work She would like to try increased dose of Remeron as well We will increase Remeron to 30 mg   No orders of the defined types were placed in this encounter.   INTERVAL HISTORY: Please see below for problem oriented charting. 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    05/07/2014 Imaging    CT scan of the chest show right thoracic inlet lymphadenopathy     06/07/2014 Procedure    She underwent ultrasound-guided core biopsy of the neck lymph node    06/07/2014 Pathology Results    Accession: [id number] biopsy confirmed diagnosis of Hodgkin lymphoma.    06/15/2014 Imaging    Echocardiogram showed preserved ejection fraction    07/09/2014 - 07/12/2014 Hospital Admission    She was admitted to the hospital for severe anemia.    07/27/2014 Procedure    She had placement of port    07/31/2014 - 09/11/2014 Chemotherapy    She received dose adjusted chemotherapy due to abnormal liver function tests and severe anemia. Treatment was delayed due to noncompliance  and subsequently stopped because the patient failed to keep appointments    01/11/2015 Imaging    Repeat PET CT scan showed response to treatment    01/28/2015 - 06/18/2015 Chemotherapy    ABVD was restarted with full dose.    02/08/2015 - 02/10/2015 Hospital Admission    The patient was admitted to the hospital due to pancytopenia and profuse diarrhea. Cultures were negative. She was placed on ciprofloxacin.    02/11/2015 Adverse Reaction    Treatment was placed on hold due to recent infection.    04/11/2015 Imaging    PET CT scan showed near complete response. Incidental finding of an abnormal bone lesion, indeterminate. She is not symptomatic. Recommendation from [facility] to observe    07/11/2015 Imaging    PET CT scan showed abnormal new bone lesions, suggestive of possible disease progression    07/23/2015 Bone Marrow Biopsy    She underwent bone biopsy    07/23/2015 Pathology Results  Accession: [id number]  biopsy was negative for cancer    11/21/2015 Surgery    She had surgery for ectopic pregnancy    01/29/2016 Imaging    Ct chest, abdomen and pelvis showed pelvic and retroperitoneal lymphadenopathy, as above, concerning for residual disease. There is also a mildly enlarged posterior mediastinal lymph node measuring 11 mm adjacent to the distal descending thoracic aorta. This may  represent an additional focus of disease, but is the only finding of concern in the thorax on today's examination. Sclerosis in the right ilium at site of previously noted metabolically active lesion, grossly unchanged. No other definite osseous lesions are identified on today's examination. Spleen is normal in size and appearance.    02/14/2016 PET scan    Interval disease worsening with new foci of hypermetabolic activity in multiple retroperitoneal and pelvic lymph nodes as well as AP window and left hilar lymph nodes. (Deauville 5). There is also overall worsening of the osseous disease.    05/11/2016 Pathology Results    Diagnosis Lymph node, needle/core biopsy, Left para-aortic retroperitoneal - CLASSICAL HODGKIN LYMPHOMA. - SEE ONCOLOGY TABLE. Microscopic Comment LYMPHOMA Histologic type: Classical Hodgkin lymphoma. Grade (if applicable): N/A Flow cytometry: Not done. Immunohistochemical stains: CD15, CD20, CD3, LCA, PAX-5, CD30 with appropriate controls. Touch preps/imprints: Not performed. Comments: The sections show small needle core biopsy fragments displaying a polymorphous cellular proliferation of small lymphocytes, plasma cells, eosinophils, and large atypical mononuclear and multilobated lymphoid cells with features of Reed-Sternberg cells and variants. This is associated with patchy areas of fibrosis. Immunohistochemical stains were performed and show that the large atypical lymphoid cells are positive for CD30, CD15 and PAX-5 and negative for LCA, CD20, CD3. The small lymphoid cells in the background show a mixture of T and B cells with predominance of T cells. The overall morphologic and histologic features are consistent with classical Hodgkin lymphoma. Further subtyping is challenging in limited small biopsy fragments but the patchy fibrosis suggests nodular sclerosis type.     05/11/2016 Procedure    She underwent CT guided biopsy of retroperitoneal lymph node    05/26/2016  Procedure    Successful placement of a right IJ approach Power Port with ultrasound and fluoroscopic guidance. The catheter is ready for use.    06/02/2016 PET scan    Mixed response to chemotherapy with some lymph nodes decreased in metabolic activity and some lymph nodes increase metabolic activity. Lymph node stations including mediastinum, periaortic retroperitoneum, and obturator node stations. Activity is remains relatively intense Deauville 4 2. LEFT infrahilar nodule / lymph node with intense metabolic activity decreased from prior. ( Deauville 4 ). 3. 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 Deauville 5    06/03/2016 - 06/05/2016 Hospital Admission    She was admitted to the hospital for cycle 1 of ICE chemotherapy    06/24/2016 - 06/26/2016 Hospital Admission    She received cycle 2 of ICE chemo    07/07/2016 PET scan    Resolution of prior hypermetabolic adenopathy and resolution of prior osseous foci of hypermetabolic activity compatible with essentially complete response to therapy (Deauville 1). 2. Generalized reduced activity in the L4 vertebral body and the type of finding which would typically reflect prior radiation therapy. 3. Stable septated fatty right pelvic lesion, possibly a dermoid or lipoma, not hypermetabolic    0/25/4270 PET scan    Hypermetabolic lesion along the L3 vertebral body and left posterior elements, max SUV 7.8 (Deauville 5). Hypermetabolic lesion along the left  inferior pubic ramus, max SUV 4.9 (Deauville 4).  IMPRESSION: Prevascular lymphadenopathy, reflecting nodal recurrence (Deauville 4). Hypermetabolic osseous metastases involving the L3 vertebral body/posterior elements and left inferior pubic ramus (Deauville 4-5). Hypermetabolism along the endometrium, new, possibly reactive/physiologic. Consider pelvic ultrasound and/or endometrial sampling as clinically warranted.    11/04/2016 - 02/24/2017 Chemotherapy    She received  Brentuximab    12/11/2016 PET scan    1. Mixed response to therapy within the skeleton. Lesions at L3 is decreased in size and metabolic activity. Residual activity remains above liver ( Deauville 4) 2. Increased activity in the LEFT sacrum with metabolic activity above liver activity ( Deauville 4 3. Decrease in size and metabolic activity of anterior mediastinal tissue consistent with resolution of thymic hyperplasia or resolution of lymphoma metabolic activity ( Deauville 2). 4. No new lymphadenopathy. Normal spleen and liver.    03/25/2017 PET scan    1. Increased metabolic activity in several normal sized retroperitoneal and right pelvic lymph nodes, Deauville 4. 2. Increase in size and metabolic activity of lesions in the L3 and L4 vertebral bodies and in the left sacrum, Deauville 5. 3. No recurrence of anterior mediastinal abnormal activity. 4. Other imaging findings of potential clinical significance: Chronic ethmoid and left maxillary sinusitis. Paraseptal emphysema at the lung apices. Right ovarian dermoid.    04/06/2017 -  Chemotherapy    The patient had Keytruda    06/09/2017 PET scan    1. Overall significant improvement with reduction in nodal activity. Several retroperitoneal nodes which were previously Deauville 4 are currently Deauville 3. The right common iliac lymph node remains at Deauville 4 with maximum SUV of 3.4, but is improved from prior SUV of 4.3. 2. The bony lesions show the greatest improvement, with the previously Deauville 5 hypermetabolic lesions currently no longer of higher metabolic activity than surrounding bone marrow, currently measuring at Deauville 3. 3. 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 4. No new lesions are identified. 5. Emphysema (ICD10-J43.9).    08/05/2017 Imaging    1. Somewhat complex left adnexal lesion, potentially hemorrhagic cyst measuring 2.9 x 2.8 cm. The possibility of torsion in the  left ovary must be considered. Advise correlation with pelvic ultrasound including Doppler assessment to further evaluate.  2.  Right ovarian dermoid, unchanged from recent PET-CT examination.  3. No adenopathy by size criteria evident. Appearance of subcentimeter mesenteric lymph nodes is stable compared to recent study.  4. No bone lesions appreciable by CT. Areas of abnormal radiotracer uptake in the mid lumbar spine noted on recent PET-CT examination. No destruction 2 or lytic change noted in the lumbar vertebrae currently there.  5. No bowel obstruction. No abscess. No periappendiceal region inflammation.  6.  Small hiatal hernia with fluid in distal esophagus.  7.  Foci of coronary artery calcification, advanced for age.  8.  Spleen normal in size and contour.  9. Small hiatal hernia with mild fluid in the distal esophagus, likely indicative of a degree of reflux.    01/04/2018 Imaging    1. No findings to suggest residual/recurrent lymphoma in the chest, abdomen or pelvis. 2. Right ovarian dermoid slightly larger than prior examinations, currently measuring 3.8 x 2.8 x 3.1 cm. 3. Additional incidental findings, as above.    07/11/2018 Imaging    No findings suspicious for recurrent lymphoma. Spleen is normal in size.  Stable right ovarian dermoid.  4 mm subpleural nodular opacity in the posterior right lower lobe, favoring subpleural  atelectasis.     REVIEW OF SYSTEMS:   Constitutional: Denies fevers, chills Eyes: Denies blurriness of vision Ears, nose, mouth, throat, and face: Denies mucositis or sore throat Respiratory: Denies cough, dyspnea or wheezes Cardiovascular: Denies palpitation, chest discomfort or lower extremity swelling Gastrointestinal:  Denies nausea, heartburn or change in bowel habits Skin: Denies abnormal skin rashes Lymphatics: Denies new lymphadenopathy or easy bruising Neurological:Denies numbness, tingling or new  weaknesses Behavioral/Psych: Mood is stable, no new changes  All other systems were reviewed with the patient and are negative.  I have reviewed the past medical history, past surgical history, social history and family history with the patient and they are unchanged from previous note.  ALLERGIES:  is allergic to temazepam.  MEDICATIONS:  Current Outpatient Medications  Medication Sig Dispense Refill  . atazanavir (REYATAZ) 300 MG capsule TAKE ONE CAPSULE BY MOUTH DAILY WITH BREAKFAST. TAKE WITH NORVIR 90 capsule 1  . cholecalciferol (VITAMIN D) 1000 units tablet Take 1 tablet (1,000 Units total) by mouth daily. 30 tablet 9  . Clotrimazole 1 % OINT Apply 1 application topically 2 (two) times daily. 56.7 g 1  . emtricitabine-tenofovir (TRUVADA) 200-300 MG tablet Take 1 tablet by mouth daily. 90 tablet 1  . fluticasone (FLONASE) 50 MCG/ACT nasal spray SHAKE LIQUID AND USE 1 SPRAY IN EACH NOSTRIL DAILY 16 g 0  . hydrocortisone (PROCTOCORT) 1 % CREA Apply 1 application topically daily. 28.4 g 5  . hydrocortisone 2.5 % cream Apply topically daily. 28 g 0  . mirtazapine (REMERON) 30 MG tablet Take 1 tablet (30 mg total) by mouth at bedtime. 30 tablet 11  . ondansetron (ZOFRAN ODT) 8 MG disintegrating tablet Take 1 tablet (8 mg total) by mouth every 8 (eight) hours as needed for nausea. 30 tablet 11  . ritonavir (NORVIR) 100 MG TABS tablet TAKE 1 TABLET BY MOUTH EVERY MORNING WITH BREAKFAST. TAKE WITH REYATAZ 90 tablet 1  . traMADol (ULTRAM) 50 MG tablet Take 2 tablets (100 mg total) by mouth every 6 (six) hours as needed for moderate pain. 90 tablet 0   No current facility-administered medications for this visit.     PHYSICAL EXAMINATION: ECOG PERFORMANCE STATUS: 1 - Symptomatic but completely ambulatory  Vitals:   10/04/18 0900  BP: 103/74  Temp: 98.9 F (37.2 C)   Filed Weights   10/04/18 0900  Weight: 108 lb (49 kg)    GENERAL:alert, no distress and comfortable SKIN: skin  color, texture, turgor are normal, no rashes or significant lesions EYES: normal, Conjunctiva are pink and non-injected, sclera clear OROPHARYNX:no exudate, no erythema and lips, buccal mucosa, and tongue normal  NECK: supple, thyroid normal size, non-tender, without nodularity LYMPH:  no palpable lymphadenopathy in the cervical, axillary or inguinal LUNGS: clear to auscultation and percussion with normal breathing effort HEART: regular rate & rhythm and no murmurs and no lower extremity edema ABDOMEN:abdomen soft, non-tender and normal bowel sounds Musculoskeletal:no cyanosis of digits and no clubbing  NEURO: alert & oriented x 3 with fluent speech, no focal motor/sensory deficits  LABORATORY DATA:  I have reviewed the data as listed    Component Value Date/Time   NA 141 09/06/2018 0812   NA 140 04/29/2017 0745   K 3.5 09/06/2018 0812   K 3.3 (L) 04/29/2017 0745   CL 104 09/06/2018 0812   CO2 26 09/06/2018 0812   CO2 23 04/29/2017 0745   GLUCOSE 90 09/06/2018 0812   GLUCOSE 83 04/29/2017 0745   BUN 12 09/06/2018 4462  BUN 10.7 04/29/2017 0745   CREATININE 1.02 (H) 09/06/2018 0812   CREATININE 1.2 (H) 04/29/2017 0745   CALCIUM 9.9 09/06/2018 0812   CALCIUM 9.8 04/29/2017 0745   PROT 7.3 09/06/2018 0812   PROT 7.6 04/29/2017 0745   ALBUMIN 4.2 09/06/2018 0812   ALBUMIN 4.0 04/29/2017 0745   AST 19 09/06/2018 0812   AST 19 04/29/2017 0745   ALT 18 09/06/2018 0812   ALT 19 04/29/2017 0745   ALKPHOS 129 (H) 09/06/2018 0812   ALKPHOS 81 04/29/2017 0745   BILITOT 0.9 09/06/2018 0812   BILITOT 1.31 (H) 04/29/2017 0745   GFRNONAA >60 09/06/2018 0812   GFRNONAA 83 02/20/2016 0908   GFRAA >60 09/06/2018 0812   GFRAA >89 02/20/2016 0908    No results found for: SPEP, UPEP  Lab Results  Component Value Date   WBC 8.5 10/04/2018   NEUTROABS 5.6 10/04/2018   HGB 13.6 10/04/2018   HCT 40.4 10/04/2018   MCV 99.3 10/04/2018   PLT 227 10/04/2018      Chemistry       Component Value Date/Time   NA 141 09/06/2018 0812   NA 140 04/29/2017 0745   K 3.5 09/06/2018 0812   K 3.3 (L) 04/29/2017 0745   CL 104 09/06/2018 0812   CO2 26 09/06/2018 0812   CO2 23 04/29/2017 0745   BUN 12 09/06/2018 0812   BUN 10.7 04/29/2017 0745   CREATININE 1.02 (H) 09/06/2018 0812   CREATININE 1.2 (H) 04/29/2017 0745      Component Value Date/Time   CALCIUM 9.9 09/06/2018 0812   CALCIUM 9.8 04/29/2017 0745   ALKPHOS 129 (H) 09/06/2018 0812   ALKPHOS 81 04/29/2017 0745   AST 19 09/06/2018 0812   AST 19 04/29/2017 0745   ALT 18 09/06/2018 0812   ALT 19 04/29/2017 0745   BILITOT 0.9 09/06/2018 0812   BILITOT 1.31 (H) 04/29/2017 0745       All questions were answered. The patient knows to call the clinic with any problems, questions or concerns. No barriers to learning was detected.  I spent 15 minutes counseling the patient face to face. The total time spent in the appointment was 20 minutes and more than 50% was on counseling and review of test results  Heath Lark, MD 10/04/2018 9:04 AM

## 2018-10-04 NOTE — Assessment & Plan Note (Signed)
She has multiple chronic joint pain and is dependent on tramadol to control her joint pain She is taking vitamin D supplement as prescribed We discussed refill policy. 

## 2018-10-04 NOTE — Assessment & Plan Note (Signed)
Her recent CT scan showed no evidence of disease She will continue pembrolizumab indefinitely I plan to repeat imaging study again due around September

## 2018-10-04 NOTE — Patient Instructions (Signed)
[facility] Discharge Instructions for Patients Receiving Chemotherapy  Today you received the following chemotherapy agents Pembrolizumab [facility]).  To help prevent nausea and vomiting after your treatment, we encourage you to take your nausea medication as prescribed.   If you develop nausea and vomiting that is not controlled by your nausea medication, call the clinic.   BELOW ARE SYMPTOMS THAT SHOULD BE REPORTED IMMEDIATELY:  *FEVER GREATER THAN 100.5 F  *CHILLS WITH OR WITHOUT FEVER  NAUSEA AND VOMITING THAT IS NOT CONTROLLED WITH YOUR NAUSEA MEDICATION  *UNUSUAL SHORTNESS OF BREATH  *UNUSUAL BRUISING OR BLEEDING  TENDERNESS IN MOUTH AND THROAT WITH OR WITHOUT PRESENCE OF ULCERS  *URINARY PROBLEMS  *BOWEL PROBLEMS  UNUSUAL RASH Items with * indicate a potential emergency and should be followed up as soon as possible.  Feel free to call the clinic should you have any questions or concerns. The clinic phone number is [phone].  Please show the [facility] at check-in to the Emergency Department and triage nurse.  Are you at risk for the Coronavirus (COVID-19)?  To be considered HIGH RISK for Coronavirus (COVID-19), you have to meet the following criteria:  . Traveled to [place], [place], [place], [place] or [place]; or in the [place] to [place], [place], [place], or [place]; and have fever, cough, and shortness of breath within the last 2 weeks of travel OR . Been in close contact with a person diagnosed with COVID-19 within the last 2 weeks and have fever, cough, and shortness of breath . IF YOU DO NOT MEET THESE CRITERIA, YOU ARE CONSIDERED LOW RISK FOR COVID-19.  What to do if you are HIGH RISK for COVID-19?  Marland Kitchen If you are having a medical emergency, call 911. . Seek medical care right away. Before you go to a doctor's office, urgent care or emergency department, call ahead and tell them about your recent travel, contact with  someone diagnosed with COVID-19, and your symptoms. You should receive instructions from your physician's office regarding next steps of care.  . When you arrive at healthcare provider, tell the healthcare staff immediately you have returned from visiting [place], [place], [place], [place] or [place]; or traveled in the [place] to [place], [place], [place], or [place]; in the last two weeks or you have been in close contact with a person diagnosed with COVID-19 in the last 2 weeks.   . Tell the health care staff about your symptoms: fever, cough and shortness of breath. . After you have been seen by a medical provider, you will be either: o Tested for (COVID-19) and discharged home on quarantine except to seek medical care if symptoms worsen, and asked to  - Stay home and avoid contact with others until you get your results (4-5 days)  - Avoid travel on public transportation if possible (such as bus, train, or airplane) or o Sent to the Emergency Department by EMS for evaluation, COVID-19 testing, and possible admission depending on your condition and test results.  What to do if you are LOW RISK for COVID-19?  Reduce your risk of any infection by using the same precautions used for avoiding the common cold or flu:  Marland Kitchen Wash your hands often with soap and warm water for at least 20 seconds.  If soap and water are not readily available, use an alcohol-based hand sanitizer with at least 60% alcohol.  . If coughing or sneezing, cover your mouth and nose by  coughing or sneezing into the elbow areas of your shirt or coat, into a tissue or into your sleeve (not your hands). . Avoid shaking hands with others and consider head nods or verbal greetings only. . Avoid touching your eyes, nose, or mouth with unwashed hands.  . Avoid close contact with people who are sick. . Avoid places or events with large numbers of people in one location, like concerts or sporting events. . Carefully consider  travel plans you have or are making. . If you are planning any travel outside or inside the [place], visit the CDC's Travelers' Health webpage for the latest health notices. . If you have some symptoms but not all symptoms, continue to monitor at home and seek medical attention if your symptoms worsen. . If you are having a medical emergency, call 911.   [place] / e-Visit: [email or web address]         [facility]: [facility]: [phone]                   [facility]: [phone]

## 2018-10-04 NOTE — Assessment & Plan Note (Signed)
She has lost some weight due to inability to eat during work She would like to try increased dose of Remeron as well We will increase Remeron to 30 mg

## 2018-10-05 ENCOUNTER — Telehealth: Payer: Self-pay | Admitting: Hematology and Oncology

## 2018-10-05 NOTE — Telephone Encounter (Signed)
I tried to reach patient and the mailbox was full , so I will mail schedule

## 2018-10-14 ENCOUNTER — Other Ambulatory Visit: Payer: Self-pay | Admitting: Hematology and Oncology

## 2018-10-14 ENCOUNTER — Telehealth: Payer: Self-pay | Admitting: *Deleted

## 2018-10-14 DIAGNOSIS — B20 Human immunodeficiency virus [HIV] disease: Secondary | ICD-10-CM

## 2018-10-14 MED ORDER — TRAMADOL HCL 50 MG PO TABS: 100.0000 mg | ORAL_TABLET | Freq: Four times a day (QID) | ORAL | 0 refills | Status: DC | PRN

## 2018-10-14 NOTE — Telephone Encounter (Signed)
done

## 2018-10-14 NOTE — Telephone Encounter (Signed)
Patient calling for a refill of her Tramadol please-  [facility] on [address].

## 2018-10-24 ENCOUNTER — Telehealth: Payer: Self-pay | Admitting: *Deleted

## 2018-10-24 ENCOUNTER — Ambulatory Visit: Payer: 59 | Admitting: Internal Medicine

## 2018-10-24 ENCOUNTER — Other Ambulatory Visit: Payer: Self-pay | Admitting: Hematology and Oncology

## 2018-10-24 DIAGNOSIS — B20 Human immunodeficiency virus [HIV] disease: Secondary | ICD-10-CM

## 2018-10-24 MED ORDER — TRAMADOL HCL 50 MG PO TABS: 100.0000 mg | ORAL_TABLET | Freq: Four times a day (QID) | ORAL | 0 refills | Status: DC | PRN

## 2018-10-24 NOTE — Telephone Encounter (Signed)
Patient calling for a refill on Tramadol please.

## 2018-10-24 NOTE — Telephone Encounter (Signed)
done

## 2018-10-25 ENCOUNTER — Ambulatory Visit (INDEPENDENT_AMBULATORY_CARE_PROVIDER_SITE_OTHER): Payer: 59 | Admitting: Internal Medicine

## 2018-10-25 ENCOUNTER — Encounter: Payer: Self-pay | Admitting: Internal Medicine

## 2018-10-25 ENCOUNTER — Other Ambulatory Visit: Payer: Self-pay

## 2018-10-25 VITALS — BP 126/77 | HR 65 | Temp 98.5°F | Wt 104.0 lb

## 2018-10-25 DIAGNOSIS — R636 Underweight: Secondary | ICD-10-CM

## 2018-10-25 DIAGNOSIS — R21 Rash and other nonspecific skin eruption: Secondary | ICD-10-CM | POA: Diagnosis not present

## 2018-10-25 DIAGNOSIS — B20 Human immunodeficiency virus [HIV] disease: Secondary | ICD-10-CM | POA: Diagnosis not present

## 2018-10-25 MED ORDER — HYDROCORTISONE 1 % EX OINT: 1.0000 "application " | TOPICAL_OINTMENT | Freq: Two times a day (BID) | CUTANEOUS | 2 refills | Status: DC

## 2018-10-25 NOTE — Assessment & Plan Note (Signed)
Will try steroid ointment

## 2018-10-25 NOTE — Assessment & Plan Note (Signed)
Doing well, labs today and rtc 4 months.

## 2018-10-25 NOTE — Assessment & Plan Note (Signed)
Encouraged continued eating and Ensure.

## 2018-10-25 NOTE — Progress Notes (Signed)
   Subjective:    Patient ID: [name], female    DOB: 01-07-1986, 33 y.o.   MRN: [id number]  HPI Here for follow up of HIV Continues on Reyataz, norvir, Truvada and no missed doses.  She has been hesitant to change and with other mediations I have kept her on this regimen. No labs prior to today's visit.  Continues to see oncology, Dr. Alvy Bimler, for her  Hodgkin lymphoma and recent CT good.  Still with a rash on her inner thighs.  Was referred to dermatology but couldn't get in.  Has not tried anything though there is a prescription for hydrocortisone from myself and Dr. Alvy Bimler.     Review of Systems  Constitutional: Negative for fatigue and fever.  Neurological: Negative for dizziness.       Objective:   Physical Exam  Constitutional: She appears well-developed and well-nourished. No distress.  HENT:  Mouth/Throat: No oropharyngeal exudate.  Eyes: No scleral icterus.  Cardiovascular: Normal rate, regular rhythm and normal heart sounds.  No murmur heard. Pulmonary/Chest: Effort normal and breath sounds normal. No respiratory distress.  Lymphadenopathy:    She has no cervical adenopathy.  Skin: Rash noted.   SH: still occasional tobacco       Assessment & Plan:

## 2018-10-26 LAB — T-HELPER CELL (CD4) - (RCID CLINIC ONLY)
CD4 % Helper T Cell: 45 % (ref 33–65)
CD4 T Cell Abs: 957 /uL (ref 400–1790)

## 2018-11-01 ENCOUNTER — Other Ambulatory Visit: Payer: Self-pay

## 2018-11-01 ENCOUNTER — Inpatient Hospital Stay (HOSPITAL_BASED_OUTPATIENT_CLINIC_OR_DEPARTMENT_OTHER): Payer: 59 | Admitting: Hematology and Oncology

## 2018-11-01 ENCOUNTER — Inpatient Hospital Stay: Payer: 59 | Attending: Hematology and Oncology

## 2018-11-01 ENCOUNTER — Inpatient Hospital Stay: Payer: 59

## 2018-11-01 ENCOUNTER — Encounter: Payer: Self-pay | Admitting: Hematology and Oncology

## 2018-11-01 ENCOUNTER — Other Ambulatory Visit: Payer: Self-pay | Admitting: Hematology and Oncology

## 2018-11-01 VITALS — BP 109/47 | HR 58 | Temp 97.8°F | Resp 18 | Ht 64.0 in | Wt 105.8 lb

## 2018-11-01 DIAGNOSIS — C8118 Nodular sclerosis classical Hodgkin lymphoma, lymph nodes of multiple sites: Secondary | ICD-10-CM

## 2018-11-01 DIAGNOSIS — G893 Neoplasm related pain (acute) (chronic): Secondary | ICD-10-CM

## 2018-11-01 DIAGNOSIS — R630 Anorexia: Secondary | ICD-10-CM

## 2018-11-01 DIAGNOSIS — F17290 Nicotine dependence, other tobacco product, uncomplicated: Secondary | ICD-10-CM | POA: Diagnosis not present

## 2018-11-01 DIAGNOSIS — Z5112 Encounter for antineoplastic immunotherapy: Secondary | ICD-10-CM | POA: Diagnosis present

## 2018-11-01 DIAGNOSIS — R636 Underweight: Secondary | ICD-10-CM

## 2018-11-01 DIAGNOSIS — Z95828 Presence of other vascular implants and grafts: Secondary | ICD-10-CM

## 2018-11-01 DIAGNOSIS — C8198 Hodgkin lymphoma, unspecified, lymph nodes of multiple sites: Secondary | ICD-10-CM

## 2018-11-01 DIAGNOSIS — Z5111 Encounter for antineoplastic chemotherapy: Secondary | ICD-10-CM | POA: Diagnosis present

## 2018-11-01 DIAGNOSIS — Z79899 Other long term (current) drug therapy: Secondary | ICD-10-CM | POA: Insufficient documentation

## 2018-11-01 LAB — COMPREHENSIVE METABOLIC PANEL
ALT: 12 U/L (ref 0–44)
AST: 15 U/L (ref 15–41)
Albumin: 4.2 g/dL (ref 3.5–5.0)
Alkaline Phosphatase: 116 U/L (ref 38–126)
Anion gap: 10 (ref 5–15)
BUN: 13 mg/dL (ref 6–20)
CO2: 21 mmol/L — ABNORMAL LOW (ref 22–32)
Calcium: 9.2 mg/dL (ref 8.9–10.3)
Chloride: 110 mmol/L (ref 98–111)
Creatinine, Ser: 1.02 mg/dL — ABNORMAL HIGH (ref 0.44–1.00)
GFR calc Af Amer: >60 mL/min (ref >60)
GFR calc non Af Amer: >60 mL/min (ref >60)
Glucose, Bld: 111 mg/dL — ABNORMAL HIGH (ref 70–99)
Potassium: 3.8 mmol/L (ref 3.5–5.1)
Sodium: 141 mmol/L (ref 135–145)
Total Bilirubin: 1 mg/dL (ref 0.3–1.2)
Total Protein: 7.4 g/dL (ref 6.5–8.1)

## 2018-11-01 LAB — CBC WITH DIFFERENTIAL/PLATELET
Abs Immature Granulocytes: 0.06 10*3/uL (ref 0.00–0.07)
Basophils Absolute: 0 10*3/uL (ref 0.0–0.1)
Basophils Relative: 0 %
Eosinophils Absolute: 0.2 10*3/uL (ref 0.0–0.5)
Eosinophils Relative: 1 %
HCT: 39.3 % (ref 36.0–46.0)
Hemoglobin: 13.6 g/dL (ref 12.0–15.0)
Immature Granulocytes: 1 %
Lymphocytes Relative: 19 %
Lymphs Abs: 2.3 10*3/uL (ref 0.7–4.0)
MCH: 33.3 pg (ref 26.0–34.0)
MCHC: 34.6 g/dL (ref 30.0–36.0)
MCV: 96.1 fL (ref 80.0–100.0)
Monocytes Absolute: 0.9 10*3/uL (ref 0.1–1.0)
Monocytes Relative: 7 %
Neutro Abs: 8.4 10*3/uL — ABNORMAL HIGH (ref 1.7–7.7)
Neutrophils Relative %: 72 %
Platelets: 241 10*3/uL (ref 150–400)
RBC: 4.09 MIL/uL (ref 3.87–5.11)
RDW: 13.6 % (ref 11.5–15.5)
WBC: 11.8 10*3/uL — ABNORMAL HIGH (ref 4.0–10.5)
nRBC: 0 % (ref 0.0–0.2)

## 2018-11-01 LAB — TSH: TSH: 2.39 u[IU]/mL (ref 0.308–3.960)

## 2018-11-01 MED ORDER — SODIUM CHLORIDE 0.9% FLUSH
10.0000 mL | INTRAVENOUS | Status: DC | PRN
  Administered 2018-11-01: 10 mL via INTRAVENOUS
  Filled 2018-11-01: qty 10

## 2018-11-01 MED ORDER — SODIUM CHLORIDE 0.9% FLUSH
10.0000 mL | INTRAVENOUS | Status: DC | PRN
  Administered 2018-11-01: 10 mL
  Filled 2018-11-01: qty 10

## 2018-11-01 MED ORDER — MEGESTROL ACETATE 40 MG PO TABS: 40.0000 mg | ORAL_TABLET | Freq: Two times a day (BID) | ORAL | 1 refills | Status: DC

## 2018-11-01 MED ORDER — SODIUM CHLORIDE 0.9 % IV SOLN
Freq: Once | INTRAVENOUS | Status: AC
  Administered 2018-11-01: 11:00:00 via INTRAVENOUS
  Filled 2018-11-01: qty 250

## 2018-11-01 MED ORDER — SODIUM CHLORIDE 0.9 % IV SOLN
200.0000 mg | Freq: Once | INTRAVENOUS | Status: AC
  Administered 2018-11-01: 200 mg via INTRAVENOUS
  Filled 2018-11-01: qty 8

## 2018-11-01 MED ORDER — HEPARIN SOD (PORK) LOCK FLUSH 100 UNIT/ML IV SOLN
500.0000 [IU] | Freq: Once | INTRAVENOUS | Status: AC | PRN
  Administered 2018-11-01: 500 [IU]
  Filled 2018-11-01: qty 5

## 2018-11-01 MED ORDER — LEUPROLIDE ACETATE 7.5 MG IM KIT
7.5000 mg | PACK | Freq: Once | INTRAMUSCULAR | Status: AC
  Administered 2018-11-01: 7.5 mg via INTRAMUSCULAR
  Filled 2018-11-01: qty 7.5

## 2018-11-01 NOTE — Assessment & Plan Note (Signed)
We discussed the importance of nicotine cessation The patient is motivated to quit on her own

## 2018-11-01 NOTE — Assessment & Plan Note (Signed)
She has multiple chronic joint pain and is dependent on tramadol to control her joint pain She is taking vitamin D supplement as prescribed We discussed refill policy. 

## 2018-11-01 NOTE — Progress Notes (Signed)
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  Cigar smoker We discussed the importance of nicotine cessation The patient is motivated to quit on her own  Anorexia She does not tolerate mirtazapine Recommend trial of Megace and she is willing to try   No orders of the defined types were placed in this encounter.   INTERVAL HISTORY: Please see below for problem oriented charting. 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   05/07/2014 Imaging   CT scan of the chest show right thoracic inlet lymphadenopathy   06/07/2014 Procedure   She underwent ultrasound-guided core biopsy of the neck lymph node   06/07/2014 Pathology Results   Accession: [id number] biopsy confirmed diagnosis of Hodgkin lymphoma.   06/15/2014 Imaging   Echocardiogram  showed preserved ejection fraction   07/09/2014 - 07/12/2014 Hospital Admission   She was admitted to the hospital for severe anemia.   07/27/2014 Procedure   She had placement of port   07/31/2014 - 09/11/2014 Chemotherapy   She received dose adjusted chemotherapy due to abnormal liver function tests and severe anemia. Treatment was delayed due to noncompliance  and subsequently stopped because the patient failed to keep appointments   01/11/2015 Imaging   Repeat PET CT scan showed response to treatment   01/28/2015 - 06/18/2015 Chemotherapy   ABVD was restarted with full dose.   02/08/2015 - 02/10/2015 Hospital Admission   The patient was admitted to the hospital due to pancytopenia and profuse diarrhea. Cultures were negative. She was placed on ciprofloxacin.   02/11/2015 Adverse Reaction   Treatment was placed on hold due to recent infection.   04/11/2015 Imaging   PET CT scan showed near complete response. Incidental finding of an abnormal bone lesion, indeterminate. She is not symptomatic. Recommendation from [facility] to observe   07/11/2015 Imaging   PET CT scan showed abnormal new bone lesions, suggestive of possible disease progression   07/23/2015 Bone Marrow Biopsy   She underwent bone biopsy   07/23/2015 Pathology Results   Accession: [id number]  biopsy was negative for cancer   11/21/2015 Surgery   She had surgery for ectopic pregnancy   01/29/2016 Imaging   Ct chest, abdomen and pelvis showed pelvic and retroperitoneal lymphadenopathy, as above, concerning for residual disease. There is also a mildly enlarged posterior mediastinal lymph node measuring 11 mm adjacent to the distal descending thoracic aorta. This may represent an additional focus of disease, but is the only finding of concern  in the thorax on today's examination. Sclerosis in the right ilium at site of previously noted metabolically active lesion, grossly unchanged. No other definite osseous lesions are identified on  today's examination. Spleen is normal in size and appearance.   02/14/2016 PET scan   Interval disease worsening with new foci of hypermetabolic activity in multiple retroperitoneal and pelvic lymph nodes as well as AP window and left hilar lymph nodes. (Deauville 5). There is also overall worsening of the osseous disease.   05/11/2016 Pathology Results   Diagnosis Lymph node, needle/core biopsy, Left para-aortic retroperitoneal - CLASSICAL HODGKIN LYMPHOMA. - SEE ONCOLOGY TABLE. Microscopic Comment LYMPHOMA Histologic type: Classical Hodgkin lymphoma. Grade (if applicable): N/A Flow cytometry: Not done. Immunohistochemical stains: CD15, CD20, CD3, LCA, PAX-5, CD30 with appropriate controls. Touch preps/imprints: Not performed. Comments: The sections show small needle core biopsy fragments displaying a polymorphous cellular proliferation of small lymphocytes, plasma cells, eosinophils, and large atypical mononuclear and multilobated lymphoid cells with features of Reed-Sternberg cells and variants. This is associated with patchy areas of fibrosis. Immunohistochemical stains were performed and show that the large atypical lymphoid cells are positive for CD30, CD15 and PAX-5 and negative for LCA, CD20, CD3. The small lymphoid cells in the background show a mixture of T and B cells with predominance of T cells. The overall morphologic and histologic features are consistent with classical Hodgkin lymphoma. Further subtyping is challenging in limited small biopsy fragments but the patchy fibrosis suggests nodular sclerosis type.    05/11/2016 Procedure   She underwent CT guided biopsy of retroperitoneal lymph node   05/26/2016 Procedure   Successful placement of a right IJ approach Power Port with ultrasound and fluoroscopic guidance. The catheter is ready for use.   06/02/2016 PET scan   Mixed response to chemotherapy with some lymph nodes decreased in metabolic activity and some lymph nodes  increase metabolic activity. Lymph node stations including mediastinum, periaortic retroperitoneum, and obturator node stations. Activity is remains relatively intense Deauville 4 2. LEFT infrahilar nodule / lymph node with intense metabolic activity decreased from prior. ( Deauville 4 ). 3. 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 Deauville 5   06/03/2016 - 06/05/2016 Hospital Admission   She was admitted to the hospital for cycle 1 of ICE chemotherapy   06/24/2016 - 06/26/2016 Hospital Admission   She received cycle 2 of ICE chemo   07/07/2016 PET scan   Resolution of prior hypermetabolic adenopathy and resolution of prior osseous foci of hypermetabolic activity compatible with essentially complete response to therapy (Deauville 1). 2. Generalized reduced activity in the L4 vertebral body and the type of finding which would typically reflect prior radiation therapy. 3. Stable septated fatty right pelvic lesion, possibly a dermoid or lipoma, not hypermetabolic   7/37/1062 PET scan   Hypermetabolic lesion along the L3 vertebral body and left posterior elements, max SUV 7.8 (Deauville 5). Hypermetabolic lesion along the left inferior pubic ramus, max SUV 4.9 (Deauville 4).  IMPRESSION: Prevascular lymphadenopathy, reflecting nodal recurrence (Deauville 4). Hypermetabolic osseous metastases involving the L3 vertebral body/posterior elements and left inferior pubic ramus (Deauville 4-5). Hypermetabolism along the endometrium, new, possibly reactive/physiologic. Consider pelvic ultrasound and/or endometrial sampling as clinically warranted.   11/04/2016 - 02/24/2017 Chemotherapy   She received Brentuximab   12/11/2016 PET scan   1. Mixed response to therapy within the skeleton. Lesions at L3 is decreased in size and metabolic activity. Residual activity remains above liver ( Deauville 4) 2. Increased activity in the LEFT  sacrum with metabolic activity above liver activity (  Deauville 4 3. Decrease in size and metabolic activity of anterior mediastinal tissue consistent with resolution of thymic hyperplasia or resolution of lymphoma metabolic activity ( Deauville 2). 4. No new lymphadenopathy. Normal spleen and liver.   03/25/2017 PET scan   1. Increased metabolic activity in several normal sized retroperitoneal and right pelvic lymph nodes, Deauville 4. 2. Increase in size and metabolic activity of lesions in the L3 and L4 vertebral bodies and in the left sacrum, Deauville 5. 3. No recurrence of anterior mediastinal abnormal activity. 4. Other imaging findings of potential clinical significance: Chronic ethmoid and left maxillary sinusitis. Paraseptal emphysema at the lung apices. Right ovarian dermoid.   04/06/2017 -  Chemotherapy   The patient had Keytruda   06/09/2017 PET scan   1. Overall significant improvement with reduction in nodal activity. Several retroperitoneal nodes which were previously Deauville 4 are currently Deauville 3. The right common iliac lymph node remains at Deauville 4 with maximum SUV of 3.4, but is improved from prior SUV of 4.3. 2. The bony lesions show the greatest improvement, with the previously Deauville 5 hypermetabolic lesions currently no longer of higher metabolic activity than surrounding bone marrow, currently measuring at Deauville 3. 3. 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 4. No new lesions are identified. 5. Emphysema (ICD10-J43.9).   08/05/2017 Imaging   1. Somewhat complex left adnexal lesion, potentially hemorrhagic cyst measuring 2.9 x 2.8 cm. The possibility of torsion in the left ovary must be considered. Advise correlation with pelvic ultrasound including Doppler assessment to further evaluate.  2.  Right ovarian dermoid, unchanged from recent PET-CT examination.  3. No adenopathy by size criteria evident. Appearance of subcentimeter mesenteric lymph nodes is  stable compared to recent study.  4. No bone lesions appreciable by CT. Areas of abnormal radiotracer uptake in the mid lumbar spine noted on recent PET-CT examination. No destruction 2 or lytic change noted in the lumbar vertebrae currently there.  5. No bowel obstruction. No abscess. No periappendiceal region inflammation.  6.  Small hiatal hernia with fluid in distal esophagus.  7.  Foci of coronary artery calcification, advanced for age.  8.  Spleen normal in size and contour.  9. Small hiatal hernia with mild fluid in the distal esophagus, likely indicative of a degree of reflux.   01/04/2018 Imaging   1. No findings to suggest residual/recurrent lymphoma in the chest, abdomen or pelvis. 2. Right ovarian dermoid slightly larger than prior examinations, currently measuring 3.8 x 2.8 x 3.1 cm. 3. Additional incidental findings, as above.   07/11/2018 Imaging   No findings suspicious for recurrent lymphoma. Spleen is normal in size.  Stable right ovarian dermoid.  4 mm subpleural nodular opacity in the posterior right lower lobe, favoring subpleural atelectasis.     REVIEW OF SYSTEMS:   Constitutional: Denies fevers, chills or abnormal weight loss Eyes: Denies blurriness of vision Ears, nose, mouth, throat, and face: Denies mucositis or sore throat Respiratory: Denies cough, dyspnea or wheezes Cardiovascular: Denies palpitation, chest discomfort or lower extremity swelling Gastrointestinal:  Denies nausea, heartburn or change in bowel habits Skin: Denies abnormal skin rashes Lymphatics: Denies new lymphadenopathy or easy bruising Neurological:Denies numbness, tingling or new weaknesses Behavioral/Psych: Mood is stable, no new changes  All other systems were reviewed with the patient and are negative.  I have reviewed the past medical history, past surgical history, social history and family history with the patient  and they are unchanged from previous  note.  ALLERGIES:  is allergic to temazepam.  MEDICATIONS:  Current Outpatient Medications  Medication Sig Dispense Refill  . atazanavir (REYATAZ) 300 MG capsule TAKE ONE CAPSULE BY MOUTH DAILY WITH BREAKFAST. TAKE WITH NORVIR 90 capsule 1  . cholecalciferol (VITAMIN D) 1000 units tablet Take 1 tablet (1,000 Units total) by mouth daily. 30 tablet 9  . Clotrimazole 1 % OINT Apply 1 application topically 2 (two) times daily. 56.7 g 1  . emtricitabine-tenofovir (TRUVADA) 200-300 MG tablet Take 1 tablet by mouth daily. 90 tablet 1  . fluticasone (FLONASE) 50 MCG/ACT nasal spray SHAKE LIQUID AND USE 1 SPRAY IN EACH NOSTRIL DAILY 16 g 0  . hydrocortisone 1 % ointment Apply 1 application topically 2 (two) times daily. To affected area 30 g 2  . megestrol (MEGACE) 40 MG tablet Take 1 tablet (40 mg total) by mouth 2 (two) times daily. 60 tablet 1  . ondansetron (ZOFRAN ODT) 8 MG disintegrating tablet Take 1 tablet (8 mg total) by mouth every 8 (eight) hours as needed for nausea. 30 tablet 11  . ritonavir (NORVIR) 100 MG TABS tablet TAKE 1 TABLET BY MOUTH EVERY MORNING WITH BREAKFAST. TAKE WITH REYATAZ 90 tablet 1  . traMADol (ULTRAM) 50 MG tablet Take 2 tablets (100 mg total) by mouth every 6 (six) hours as needed for moderate pain. 90 tablet 0   No current facility-administered medications for this visit.    Facility-Administered Medications Ordered in Other Visits  Medication Dose Route Frequency Provider Last Rate Last Dose  . sodium chloride flush (NS) 0.9 % injection 10 mL  10 mL Intracatheter PRN Heath Lark, MD   10 mL at 11/01/18 1207    PHYSICAL EXAMINATION: ECOG PERFORMANCE STATUS: 1 - Symptomatic but completely ambulatory  Vitals:   11/01/18 1001  BP: (!) 109/47  Pulse: (!) 58  Resp: 18  Temp: 97.8 F (36.6 C)   Filed Weights   11/01/18 1001  Weight: 105 lb 12.8 oz (48 kg)    GENERAL:alert, no distress and comfortable SKIN: skin color, texture, turgor are normal, no rashes  or significant lesions EYES: normal, Conjunctiva are pink and non-injected, sclera clear OROPHARYNX:no exudate, no erythema and lips, buccal mucosa, and tongue normal  NECK: supple, thyroid normal size, non-tender, without nodularity LYMPH:  no palpable lymphadenopathy in the cervical, axillary or inguinal LUNGS: clear to auscultation and percussion with normal breathing effort HEART: regular rate & rhythm and no murmurs and no lower extremity edema ABDOMEN:abdomen soft, non-tender and normal bowel sounds Musculoskeletal:no cyanosis of digits and no clubbing  NEURO: alert & oriented x 3 with fluent speech, no focal motor/sensory deficits  LABORATORY DATA:  I have reviewed the data as listed    Component Value Date/Time   NA 141 11/01/2018 0849   NA 140 04/29/2017 0745   K 3.8 11/01/2018 0849   K 3.3 (L) 04/29/2017 0745   CL 110 11/01/2018 0849   CO2 21 (L) 11/01/2018 0849   CO2 23 04/29/2017 0745   GLUCOSE 111 (H) 11/01/2018 0849   GLUCOSE 83 04/29/2017 0745   BUN 13 11/01/2018 0849   BUN 10.7 04/29/2017 0745   CREATININE 1.02 (H) 11/01/2018 0849   CREATININE 1.2 (H) 04/29/2017 0745   CALCIUM 9.2 11/01/2018 0849   CALCIUM 9.8 04/29/2017 0745   PROT 7.4 11/01/2018 0849   PROT 7.6 04/29/2017 0745   ALBUMIN 4.2 11/01/2018 0849   ALBUMIN 4.0 04/29/2017 0745   AST 15  11/01/2018 0849   AST 19 04/29/2017 0745   ALT 12 11/01/2018 0849   ALT 19 04/29/2017 0745   ALKPHOS 116 11/01/2018 0849   ALKPHOS 81 04/29/2017 0745   BILITOT 1.0 11/01/2018 0849   BILITOT 1.31 (H) 04/29/2017 0745   GFRNONAA >60 11/01/2018 0849   GFRNONAA 83 02/20/2016 0908   GFRAA >60 11/01/2018 0849   GFRAA >89 02/20/2016 0908    No results found for: SPEP, UPEP  Lab Results  Component Value Date   WBC 11.8 (H) 11/01/2018   NEUTROABS 8.4 (H) 11/01/2018   HGB 13.6 11/01/2018   HCT 39.3 11/01/2018   MCV 96.1 11/01/2018   PLT 241 11/01/2018      Chemistry      Component Value Date/Time   NA 141  11/01/2018 0849   NA 140 04/29/2017 0745   K 3.8 11/01/2018 0849   K 3.3 (L) 04/29/2017 0745   CL 110 11/01/2018 0849   CO2 21 (L) 11/01/2018 0849   CO2 23 04/29/2017 0745   BUN 13 11/01/2018 0849   BUN 10.7 04/29/2017 0745   CREATININE 1.02 (H) 11/01/2018 0849   CREATININE 1.2 (H) 04/29/2017 0745      Component Value Date/Time   CALCIUM 9.2 11/01/2018 0849   CALCIUM 9.8 04/29/2017 0745   ALKPHOS 116 11/01/2018 0849   ALKPHOS 81 04/29/2017 0745   AST 15 11/01/2018 0849   AST 19 04/29/2017 0745   ALT 12 11/01/2018 0849   ALT 19 04/29/2017 0745   BILITOT 1.0 11/01/2018 0849   BILITOT 1.31 (H) 04/29/2017 0745       All questions were answered. The patient knows to call the clinic with any problems, questions or concerns. No barriers to learning was detected.  I spent 15 minutes counseling the patient face to face. The total time spent in the appointment was 20 minutes and more than 50% was on counseling and review of test results  Heath Lark, MD 11/01/2018 1:15 PM

## 2018-11-01 NOTE — Assessment & Plan Note (Signed)
Her recent CT scan showed no evidence of disease She will continue pembrolizumab indefinitely I plan to repeat imaging study again due around September

## 2018-11-01 NOTE — Assessment & Plan Note (Signed)
She does not tolerate mirtazapine Recommend trial of Megace and she is willing to try

## 2018-11-03 LAB — HIV-1 RNA QUANT-NO REFLEX-BLD
HIV 1 RNA Quant: <20 copies/mL
HIV-1 RNA Quant, Log: <1.3 Log copies/mL

## 2018-11-04 ENCOUNTER — Telehealth: Payer: Self-pay

## 2018-11-04 ENCOUNTER — Other Ambulatory Visit: Payer: Self-pay | Admitting: Hematology and Oncology

## 2018-11-04 DIAGNOSIS — B20 Human immunodeficiency virus [HIV] disease: Secondary | ICD-10-CM

## 2018-11-04 MED ORDER — TRAMADOL HCL 50 MG PO TABS: 100.0000 mg | ORAL_TABLET | Freq: Four times a day (QID) | ORAL | 0 refills | Status: DC | PRN

## 2018-11-04 NOTE — Telephone Encounter (Signed)
She called regarding Megace. She stopped taking it because it was making her sweaty at night, itching a lot and having a hard time sleeping.  Just FYI.

## 2018-11-04 NOTE — Telephone Encounter (Signed)
I removed it from her med list

## 2018-11-04 NOTE — Telephone Encounter (Signed)
She called requesting refill on Tramadol.

## 2018-11-13 ENCOUNTER — Other Ambulatory Visit: Payer: Self-pay | Admitting: Hematology and Oncology

## 2018-11-13 DIAGNOSIS — B20 Human immunodeficiency virus [HIV] disease: Secondary | ICD-10-CM

## 2018-11-14 ENCOUNTER — Telehealth: Payer: Self-pay | Admitting: *Deleted

## 2018-11-14 NOTE — Telephone Encounter (Signed)
She is not due till tomorrow

## 2018-11-14 NOTE — Telephone Encounter (Signed)
Patient calling for a refill of Tramadol. Please send to her [place].

## 2018-11-14 NOTE — Telephone Encounter (Signed)
Patient advised she will be able to pick up script tomorrow. Discussed the request was a day early- she understood.

## 2018-11-15 ENCOUNTER — Other Ambulatory Visit: Payer: Self-pay | Admitting: Hematology and Oncology

## 2018-11-15 ENCOUNTER — Telehealth: Payer: Self-pay

## 2018-11-15 NOTE — Telephone Encounter (Signed)
-----   Message from Heath Lark, MD sent at 11/15/2018  2:36 PM EDT ----- Regarding: RE: pharmacy will not fill prescription without your OK Not ok to fill so soon She will have to wait until 7/23 ----- Message ----- From: Paulla Dolly, RN Sent: 11/15/2018   2:34 PM EDT To: Heath Lark, MD Subject: pharmacy will not fill prescription without #  Her pharmacy will not fill the tramadol prescription sent in today because it is not due until 7/23 unless you give the OK.  Please advise

## 2018-11-15 NOTE — Telephone Encounter (Signed)
Called pt and gave below msg. Called pharmacy to confirm no refill until 7/23.

## 2018-11-17 ENCOUNTER — Other Ambulatory Visit: Payer: Self-pay

## 2018-11-17 ENCOUNTER — Telehealth: Payer: Self-pay

## 2018-11-17 MED ORDER — PREDNISONE 20 MG PO TABS: 20.0000 mg | ORAL_TABLET | Freq: Every day | ORAL | 3 refills | Status: DC

## 2018-11-17 NOTE — Telephone Encounter (Signed)
She called requesting refill on Tramadol. Rx sent on 7/21 and can be refilled today. She is complaining of a lot of leg pain for 1 1/2 weeks with leg and toe cramps. She is working now and it could be from being more active.  Reported above to Dr. Alvy Bimler. Offered Rx of Prednisone 20 mg daily for 7 days with 3 refills, take it for the increased pain. She would like Rx sent to pharmacy. Rx sent to pharmacy.

## 2018-11-19 ENCOUNTER — Other Ambulatory Visit: Payer: Self-pay | Admitting: Internal Medicine

## 2018-11-19 DIAGNOSIS — B20 Human immunodeficiency virus [HIV] disease: Secondary | ICD-10-CM

## 2018-11-21 ENCOUNTER — Other Ambulatory Visit: Payer: Self-pay | Admitting: *Deleted

## 2018-11-21 ENCOUNTER — Telehealth: Payer: Self-pay | Admitting: *Deleted

## 2018-11-21 DIAGNOSIS — B20 Human immunodeficiency virus [HIV] disease: Secondary | ICD-10-CM

## 2018-11-21 MED ORDER — RITONAVIR 100 MG PO TABS: ORAL_TABLET | ORAL | 1 refills | Status: DC

## 2018-11-21 MED ORDER — TRUVADA 200-300 MG PO TABS: 1.0000 | ORAL_TABLET | Freq: Every day | ORAL | 1 refills | Status: DC

## 2018-11-21 MED ORDER — ATAZANAVIR SULFATE 300 MG PO CAPS: ORAL_CAPSULE | ORAL | 1 refills | Status: DC

## 2018-11-21 NOTE — Telephone Encounter (Signed)
Patient called with concerns of facial swelling. She reports this started over the weekend. Her eye, and cheeks are swollen some. She denies any difficulty breathing or swallowing and no voice changes. She will discontinue the prednisone now that she has her tramadol refilled. She will seek immediate care if the swelling does not resolve or she notices difficulty breathing or swallowing. She understands to call this office with any questions or concerns but will seek emergency care if necessary.

## 2018-11-25 ENCOUNTER — Other Ambulatory Visit: Payer: Self-pay | Admitting: Hematology and Oncology

## 2018-11-25 ENCOUNTER — Telehealth: Payer: Self-pay | Admitting: *Deleted

## 2018-11-25 DIAGNOSIS — B20 Human immunodeficiency virus [HIV] disease: Secondary | ICD-10-CM

## 2018-11-25 MED ORDER — TRAMADOL HCL 50 MG PO TABS: ORAL_TABLET | ORAL | 0 refills | Status: DC

## 2018-11-25 NOTE — Telephone Encounter (Signed)
Patient calling for Tramadol refill, she needs it today her legs are really bothering her. Please send to her [place].

## 2018-11-25 NOTE — Telephone Encounter (Signed)
done

## 2018-11-29 ENCOUNTER — Inpatient Hospital Stay: Payer: 59 | Attending: Hematology and Oncology

## 2018-11-29 ENCOUNTER — Inpatient Hospital Stay: Payer: 59

## 2018-11-29 ENCOUNTER — Other Ambulatory Visit: Payer: Self-pay

## 2018-11-29 ENCOUNTER — Encounter: Payer: Self-pay | Admitting: Hematology and Oncology

## 2018-11-29 ENCOUNTER — Inpatient Hospital Stay (HOSPITAL_BASED_OUTPATIENT_CLINIC_OR_DEPARTMENT_OTHER): Payer: 59 | Admitting: Hematology and Oncology

## 2018-11-29 ENCOUNTER — Other Ambulatory Visit: Payer: Self-pay | Admitting: Hematology and Oncology

## 2018-11-29 DIAGNOSIS — C8118 Nodular sclerosis classical Hodgkin lymphoma, lymph nodes of multiple sites: Secondary | ICD-10-CM

## 2018-11-29 DIAGNOSIS — Z79899 Other long term (current) drug therapy: Secondary | ICD-10-CM | POA: Diagnosis not present

## 2018-11-29 DIAGNOSIS — Z5111 Encounter for antineoplastic chemotherapy: Secondary | ICD-10-CM

## 2018-11-29 DIAGNOSIS — B20 Human immunodeficiency virus [HIV] disease: Secondary | ICD-10-CM | POA: Diagnosis not present

## 2018-11-29 DIAGNOSIS — Z5112 Encounter for antineoplastic immunotherapy: Secondary | ICD-10-CM | POA: Diagnosis present

## 2018-11-29 DIAGNOSIS — Z95828 Presence of other vascular implants and grafts: Secondary | ICD-10-CM

## 2018-11-29 DIAGNOSIS — G893 Neoplasm related pain (acute) (chronic): Secondary | ICD-10-CM | POA: Diagnosis not present

## 2018-11-29 DIAGNOSIS — C8198 Hodgkin lymphoma, unspecified, lymph nodes of multiple sites: Secondary | ICD-10-CM

## 2018-11-29 LAB — COMPREHENSIVE METABOLIC PANEL
ALT: 16 U/L (ref 0–44)
AST: 13 U/L — ABNORMAL LOW (ref 15–41)
Albumin: 3.9 g/dL (ref 3.5–5.0)
Alkaline Phosphatase: 86 U/L (ref 38–126)
Anion gap: 9 (ref 5–15)
BUN: 12 mg/dL (ref 6–20)
CO2: 23 mmol/L (ref 22–32)
Calcium: 9.8 mg/dL (ref 8.9–10.3)
Chloride: 108 mmol/L (ref 98–111)
Creatinine, Ser: 1.01 mg/dL — ABNORMAL HIGH (ref 0.44–1.00)
GFR calc Af Amer: >60 mL/min (ref >60)
GFR calc non Af Amer: >60 mL/min (ref >60)
Glucose, Bld: 95 mg/dL (ref 70–99)
Potassium: 3.9 mmol/L (ref 3.5–5.1)
Sodium: 140 mmol/L (ref 135–145)
Total Bilirubin: 0.8 mg/dL (ref 0.3–1.2)
Total Protein: 7 g/dL (ref 6.5–8.1)

## 2018-11-29 LAB — CBC WITH DIFFERENTIAL/PLATELET
Abs Immature Granulocytes: 0.03 10*3/uL (ref 0.00–0.07)
Basophils Absolute: 0 10*3/uL (ref 0.0–0.1)
Basophils Relative: 0 %
Eosinophils Absolute: 0.1 10*3/uL (ref 0.0–0.5)
Eosinophils Relative: 1 %
HCT: 40.9 % (ref 36.0–46.0)
Hemoglobin: 14.1 g/dL (ref 12.0–15.0)
Immature Granulocytes: 0 %
Lymphocytes Relative: 36 %
Lymphs Abs: 3.6 10*3/uL (ref 0.7–4.0)
MCH: 34 pg (ref 26.0–34.0)
MCHC: 34.5 g/dL (ref 30.0–36.0)
MCV: 98.6 fL (ref 80.0–100.0)
Monocytes Absolute: 0.5 10*3/uL (ref 0.1–1.0)
Monocytes Relative: 5 %
Neutro Abs: 5.8 10*3/uL (ref 1.7–7.7)
Neutrophils Relative %: 58 %
Platelets: 232 10*3/uL (ref 150–400)
RBC: 4.15 MIL/uL (ref 3.87–5.11)
RDW: 15.1 % (ref 11.5–15.5)
WBC: 10.1 10*3/uL (ref 4.0–10.5)
nRBC: 0 % (ref 0.0–0.2)

## 2018-11-29 LAB — TSH: TSH: 3.619 u[IU]/mL (ref 0.308–3.960)

## 2018-11-29 MED ORDER — HEPARIN SOD (PORK) LOCK FLUSH 100 UNIT/ML IV SOLN
500.0000 [IU] | Freq: Once | INTRAVENOUS | Status: AC | PRN
  Administered 2018-11-29: 500 [IU]
  Filled 2018-11-29: qty 5

## 2018-11-29 MED ORDER — SODIUM CHLORIDE 0.9% FLUSH
10.0000 mL | INTRAVENOUS | Status: DC | PRN
  Administered 2018-11-29: 10 mL via INTRAVENOUS
  Filled 2018-11-29: qty 10

## 2018-11-29 MED ORDER — LEUPROLIDE ACETATE 7.5 MG IM KIT
7.5000 mg | PACK | Freq: Once | INTRAMUSCULAR | Status: AC
  Administered 2018-11-29: 7.5 mg via INTRAMUSCULAR
  Filled 2018-11-29: qty 7.5

## 2018-11-29 MED ORDER — SODIUM CHLORIDE 0.9 % IV SOLN
Freq: Once | INTRAVENOUS | Status: AC
  Administered 2018-11-29: 10:00:00 via INTRAVENOUS
  Filled 2018-11-29: qty 250

## 2018-11-29 MED ORDER — SODIUM CHLORIDE 0.9 % IV SOLN
200.0000 mg | Freq: Once | INTRAVENOUS | Status: AC
  Administered 2018-11-29: 200 mg via INTRAVENOUS
  Filled 2018-11-29: qty 8

## 2018-11-29 MED ORDER — SODIUM CHLORIDE 0.9% FLUSH
10.0000 mL | INTRAVENOUS | Status: DC | PRN
  Administered 2018-11-29: 10 mL
  Filled 2018-11-29: qty 10

## 2018-11-29 NOTE — Assessment & Plan Note (Signed)
She has multiple chronic joint pain and is dependent on tramadol to control her joint pain She is taking vitamin D supplement as prescribed We discussed refill policy. 

## 2018-11-29 NOTE — Assessment & Plan Note (Signed)
She will continue anti-retroviral treatment as directed She will continue close follow-up with infectious disease service.

## 2018-11-29 NOTE — Assessment & Plan Note (Signed)
Her recent CT scan showed no evidence of disease She will continue pembrolizumab indefinitely Due to lack of symptoms, I plan to order imaging study once a year, due next year

## 2018-11-29 NOTE — Progress Notes (Signed)
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  Human immunodeficiency virus (HIV) disease ([place]) She will continue anti-retroviral treatment as directed She will continue close follow-up with infectious disease service.   No orders of the defined types were placed in this encounter.   INTERVAL HISTORY: Please see below for problem oriented charting. She returns for further follow-up She has mild weight gain since we introduce appetite stimulant She does not like to take prednisone because it causes swelling She continues to smoke cigar, unable to quit She continues to have profound joint pain.  Previously, we discussed other form of pain management but she declined. 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   05/07/2014 Imaging   CT scan of the chest show right thoracic inlet lymphadenopathy   06/07/2014 Procedure   She underwent ultrasound-guided core biopsy of the neck lymph node   06/07/2014 Pathology Results   Accession: [id number] biopsy confirmed  diagnosis of Hodgkin lymphoma.   06/15/2014 Imaging   Echocardiogram showed preserved ejection fraction   07/09/2014 - 07/12/2014 Hospital Admission   She was admitted to the hospital for severe anemia.   07/27/2014 Procedure   She had placement of port   07/31/2014 - 09/11/2014 Chemotherapy   She received dose adjusted chemotherapy due to abnormal liver function tests and severe anemia. Treatment was delayed due to noncompliance  and subsequently stopped because the patient failed to keep appointments   01/11/2015 Imaging   Repeat PET CT scan showed response to treatment   01/28/2015 - 06/18/2015 Chemotherapy   ABVD was restarted with full dose.   02/08/2015 - 02/10/2015 Hospital Admission   The patient was admitted to the hospital due to pancytopenia and profuse diarrhea. Cultures were negative. She was placed on ciprofloxacin.   02/11/2015 Adverse Reaction   Treatment was placed on hold due to recent infection.   04/11/2015 Imaging   PET CT scan showed near complete response. Incidental finding of an abnormal bone lesion, indeterminate. She is not symptomatic. Recommendation from [facility] to observe   07/11/2015 Imaging   PET CT scan showed abnormal new bone lesions, suggestive of possible disease progression   07/23/2015 Bone Marrow Biopsy   She underwent bone biopsy   07/23/2015 Pathology Results   Accession: [id number]  biopsy was negative for cancer   11/21/2015 Surgery   She had surgery for ectopic pregnancy   01/29/2016 Imaging   Ct chest, abdomen and pelvis showed pelvic and retroperitoneal lymphadenopathy, as above, concerning for residual disease. There is also a mildly enlarged posterior mediastinal lymph node measuring 11 mm adjacent to the distal descending thoracic aorta. This may represent an additional focus of disease,  but is the only finding of concern in the thorax on today's examination. Sclerosis in the right ilium at site of previously noted metabolically active lesion,  grossly unchanged. No other definite osseous lesions are identified on today's examination. Spleen is normal in size and appearance.   02/14/2016 PET scan   Interval disease worsening with new foci of hypermetabolic activity in multiple retroperitoneal and pelvic lymph nodes as well as AP window and left hilar lymph nodes. (Deauville 5). There is also overall worsening of the osseous disease.   05/11/2016 Pathology Results   Diagnosis Lymph node, needle/core biopsy, Left para-aortic retroperitoneal - CLASSICAL HODGKIN LYMPHOMA. - SEE ONCOLOGY TABLE. Microscopic Comment LYMPHOMA Histologic type: Classical Hodgkin lymphoma. Grade (if applicable): N/A Flow cytometry: Not done. Immunohistochemical stains: CD15, CD20, CD3, LCA, PAX-5, CD30 with appropriate controls. Touch preps/imprints: Not performed. Comments: The sections show small needle core biopsy fragments displaying a polymorphous cellular proliferation of small lymphocytes, plasma cells, eosinophils, and large atypical mononuclear and multilobated lymphoid cells with features of Reed-Sternberg cells and variants. This is associated with patchy areas of fibrosis. Immunohistochemical stains were performed and show that the large atypical lymphoid cells are positive for CD30, CD15 and PAX-5 and negative for LCA, CD20, CD3. The small lymphoid cells in the background show a mixture of T and B cells with predominance of T cells. The overall morphologic and histologic features are consistent with classical Hodgkin lymphoma. Further subtyping is challenging in limited small biopsy fragments but the patchy fibrosis suggests nodular sclerosis type.    05/11/2016 Procedure   She underwent CT guided biopsy of retroperitoneal lymph node   05/26/2016 Procedure   Successful placement of a right IJ approach Power Port with ultrasound and fluoroscopic guidance. The catheter is ready for use.   06/02/2016 PET scan   Mixed response to chemotherapy with some  lymph nodes decreased in metabolic activity and some lymph nodes increase metabolic activity. Lymph node stations including mediastinum, periaortic retroperitoneum, and obturator node stations. Activity is remains relatively intense Deauville 4 2. LEFT infrahilar nodule / lymph node with intense metabolic activity decreased from prior. ( [place] 4 ). 3. 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 Deauville 5   06/03/2016 - 06/05/2016 Hospital Admission   She was admitted to the hospital for cycle 1 of ICE chemotherapy   06/24/2016 - 06/26/2016 Hospital Admission   She received cycle 2 of ICE chemo   07/07/2016 PET scan   Resolution of prior hypermetabolic adenopathy and resolution of prior osseous foci of hypermetabolic activity compatible with essentially complete response to therapy (Deauville 1). 2. Generalized reduced activity in the L4 vertebral body and the type of finding which would typically reflect prior radiation therapy. 3. Stable septated fatty right pelvic lesion, possibly a dermoid or lipoma, not hypermetabolic   11/06/4578 PET scan   Hypermetabolic lesion along the L3 vertebral body and left posterior elements, max SUV 7.8 (Deauville 5). Hypermetabolic lesion along the left inferior pubic ramus, max SUV 4.9 (Deauville 4).  IMPRESSION: Prevascular lymphadenopathy, reflecting nodal recurrence (Deauville 4). Hypermetabolic osseous metastases involving the L3 vertebral body/posterior elements and left inferior pubic ramus (Deauville 4-5). Hypermetabolism along the endometrium, new, possibly reactive/physiologic. Consider pelvic ultrasound and/or endometrial sampling as clinically warranted.   11/04/2016 - 02/24/2017 Chemotherapy   She received Brentuximab   12/11/2016 PET scan   1. Mixed response to therapy within the skeleton. Lesions at L3 is decreased in size and metabolic activity. Residual activity remains above liver ( [place]ville  4) 2. Increased activity  in the LEFT sacrum with metabolic activity above liver activity ( [place] 4 3. Decrease in size and metabolic activity of anterior mediastinal tissue consistent with resolution of thymic hyperplasia or resolution of lymphoma metabolic activity ( Deauville 2). 4. No new lymphadenopathy. Normal spleen and liver.   03/25/2017 PET scan   1. Increased metabolic activity in several normal sized retroperitoneal and right pelvic lymph nodes, Deauville 4. 2. Increase in size and metabolic activity of lesions in the L3 and L4 vertebral bodies and in the left sacrum, Deauville 5. 3. No recurrence of anterior mediastinal abnormal activity. 4. Other imaging findings of potential clinical significance: Chronic ethmoid and left maxillary sinusitis. Paraseptal emphysema at the lung apices. Right ovarian dermoid.   04/06/2017 -  Chemotherapy   The patient had Keytruda   06/09/2017 PET scan   1. Overall significant improvement with reduction in nodal activity. Several retroperitoneal nodes which were previously Deauville 4 are currently [place] 3. The right common iliac lymph node remains at [place] 4 with maximum SUV of 3.4, but is improved from prior SUV of 4.3. 2. The bony lesions show the greatest improvement, with the previously [place]auville 5 hypermetabolic lesions currently no longer of higher metabolic activity than surrounding bone marrow, currently measuring at [place] 3. 3. 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 4. No new lesions are identified. 5. Emphysema (ICD10-J43.9).   08/05/2017 Imaging   1. Somewhat complex left adnexal lesion, potentially hemorrhagic cyst measuring 2.9 x 2.8 cm. The possibility of torsion in the left ovary must be considered. Advise correlation with pelvic ultrasound including Doppler assessment to further evaluate.  2.  Right ovarian dermoid, unchanged from recent PET-CT examination.  3. No adenopathy by size criteria  evident. Appearance of subcentimeter mesenteric lymph nodes is stable compared to recent study.  4. No bone lesions appreciable by CT. Areas of abnormal radiotracer uptake in the mid lumbar spine noted on recent PET-CT examination. No destruction 2 or lytic change noted in the lumbar vertebrae currently there.  5. No bowel obstruction. No abscess. No periappendiceal region inflammation.  6.  Small hiatal hernia with fluid in distal esophagus.  7.  Foci of coronary artery calcification, advanced for age.  8.  Spleen normal in size and contour.  9. Small hiatal hernia with mild fluid in the distal esophagus, likely indicative of a degree of reflux.   01/04/2018 Imaging   1. No findings to suggest residual/recurrent lymphoma in the chest, abdomen or pelvis. 2. Right ovarian dermoid slightly larger than prior examinations, currently measuring 3.8 x 2.8 x 3.1 cm. 3. Additional incidental findings, as above.   07/11/2018 Imaging   No findings suspicious for recurrent lymphoma. Spleen is normal in size.  Stable right ovarian dermoid.  4 mm subpleural nodular opacity in the posterior right lower lobe, favoring subpleural atelectasis.     REVIEW OF SYSTEMS:   Constitutional: Denies fevers, chills or abnormal weight loss Eyes: Denies blurriness of vision Ears, nose, mouth, throat, and face: Denies mucositis or sore throat Respiratory: Denies cough, dyspnea or wheezes Cardiovascular: Denies palpitation, chest discomfort or lower extremity swelling Gastrointestinal:  Denies nausea, heartburn or change in bowel habits Skin: Denies abnormal skin rashes Lymphatics: Denies new lymphadenopathy or easy bruising Neurological:Denies numbness, tingling or new weaknesses Behavioral/Psych: Mood is stable, no new changes  All other systems were reviewed with the patient and are negative.  I have reviewed the past medical history, past surgical history, social  history and family history with  the patient and they are unchanged from previous note.  ALLERGIES:  is allergic to temazepam.  MEDICATIONS:  Current Outpatient Medications  Medication Sig Dispense Refill  . atazanavir (REYATAZ) 300 MG capsule TAKE ONE CAPSULE BY MOUTH DAILY WITH BREAKFAST. TAKE WITH NORVIR 90 capsule 1  . cholecalciferol (VITAMIN D) 1000 units tablet Take 1 tablet (1,000 Units total) by mouth daily. 30 tablet 9  . Clotrimazole 1 % OINT Apply 1 application topically 2 (two) times daily. 56.7 g 1  . emtricitabine-tenofovir (TRUVADA) 200-300 MG tablet Take 1 tablet by mouth daily. 90 tablet 1  . fluticasone (FLONASE) 50 MCG/ACT nasal spray SHAKE LIQUID AND USE 1 SPRAY IN EACH NOSTRIL DAILY 16 g 0  . hydrocortisone 1 % ointment Apply 1 application topically 2 (two) times daily. To affected area 30 g 2  . ondansetron (ZOFRAN ODT) 8 MG disintegrating tablet Take 1 tablet (8 mg total) by mouth every 8 (eight) hours as needed for nausea. 30 tablet 11  . ritonavir (NORVIR) 100 MG TABS tablet TAKE 1 TABLET BY MOUTH EVERY MORNING WITH BREAKFAST. TAKE WITH REYATAZ 90 tablet 1  . traMADol (ULTRAM) 50 MG tablet TAKE 2 TABLETS(100 MG) BY MOUTH EVERY 6 HOURS AS NEEDED FOR MODERATE PAIN 90 tablet 0   No current facility-administered medications for this visit.    Facility-Administered Medications Ordered in Other Visits  Medication Dose Route Frequency Provider Last Rate Last Dose  . sodium chloride flush (NS) 0.9 % injection 10 mL  10 mL Intracatheter PRN Alvy Bimler, Phil Corti, MD   10 mL at 11/29/18 1145    PHYSICAL EXAMINATION: ECOG PERFORMANCE STATUS: 1 - Symptomatic but completely ambulatory  Vitals:   11/29/18 1016  BP: 139/81  Pulse: 60  Resp: 18  Temp: 98.3 F (36.8 C)  SpO2: 100%   Filed Weights   11/29/18 1016  Weight: 108 lb 12.8 oz (49.4 kg)    GENERAL:alert, no distress and comfortable SKIN: skin color, texture, turgor are normal, no rashes or significant lesions EYES: normal, Conjunctiva are pink and  non-injected, sclera clear OROPHARYNX:no exudate, no erythema and lips, buccal mucosa, and tongue normal  NECK: supple, thyroid normal size, non-tender, without nodularity LYMPH:  no palpable lymphadenopathy in the cervical, axillary or inguinal LUNGS: clear to auscultation and percussion with normal breathing effort HEART: regular rate & rhythm and no murmurs and no lower extremity edema ABDOMEN:abdomen soft, non-tender and normal bowel sounds Musculoskeletal:no cyanosis of digits and no clubbing  NEURO: alert & oriented x 3 with fluent speech, no focal motor/sensory deficits  LABORATORY DATA:  I have reviewed the data as listed    Component Value Date/Time   NA 140 11/29/2018 0906   NA 140 04/29/2017 0745   K 3.9 11/29/2018 0906   K 3.3 (L) 04/29/2017 0745   CL 108 11/29/2018 0906   CO2 23 11/29/2018 0906   CO2 23 04/29/2017 0745   GLUCOSE 95 11/29/2018 0906   GLUCOSE 83 04/29/2017 0745   BUN 12 11/29/2018 0906   BUN 10.7 04/29/2017 0745   CREATININE 1.01 (H) 11/29/2018 0906   CREATININE 1.2 (H) 04/29/2017 0745   CALCIUM 9.8 11/29/2018 0906   CALCIUM 9.8 04/29/2017 0745   PROT 7.0 11/29/2018 0906   PROT 7.6 04/29/2017 0745   ALBUMIN 3.9 11/29/2018 0906   ALBUMIN 4.0 04/29/2017 0745   AST 13 (L) 11/29/2018 0906   AST 19 04/29/2017 0745   ALT 16 11/29/2018 0906   ALT 19  04/29/2017 0745   ALKPHOS 86 11/29/2018 0906   ALKPHOS 81 04/29/2017 0745   BILITOT 0.8 11/29/2018 0906   BILITOT 1.31 (H) 04/29/2017 0745   GFRNONAA >60 11/29/2018 0906   GFRNONAA 83 02/20/2016 0908   GFRAA >60 11/29/2018 0906   GFRAA >89 02/20/2016 0908    No results found for: SPEP, UPEP  Lab Results  Component Value Date   WBC 10.1 11/29/2018   NEUTROABS 5.8 11/29/2018   HGB 14.1 11/29/2018   HCT 40.9 11/29/2018   MCV 98.6 11/29/2018   PLT 232 11/29/2018      Chemistry      Component Value Date/Time   NA 140 11/29/2018 0906   NA 140 04/29/2017 0745   K 3.9 11/29/2018 0906   K 3.3  (L) 04/29/2017 0745   CL 108 11/29/2018 0906   CO2 23 11/29/2018 0906   CO2 23 04/29/2017 0745   BUN 12 11/29/2018 0906   BUN 10.7 04/29/2017 0745   CREATININE 1.01 (H) 11/29/2018 0906   CREATININE 1.2 (H) 04/29/2017 0745      Component Value Date/Time   CALCIUM 9.8 11/29/2018 0906   CALCIUM 9.8 04/29/2017 0745   ALKPHOS 86 11/29/2018 0906   ALKPHOS 81 04/29/2017 0745   AST 13 (L) 11/29/2018 0906   AST 19 04/29/2017 0745   ALT 16 11/29/2018 0906   ALT 19 04/29/2017 0745   BILITOT 0.8 11/29/2018 0906   BILITOT 1.31 (H) 04/29/2017 0745     All questions were answered. The patient knows to call the clinic with any problems, questions or concerns. No barriers to learning was detected.  I spent 15 minutes counseling the patient face to face. The total time spent in the appointment was 20 minutes and more than 50% was on counseling and review of test results  Heath Lark, MD 11/29/2018 2:05 PM

## 2018-11-30 ENCOUNTER — Telehealth: Payer: Self-pay | Admitting: Hematology and Oncology

## 2018-11-30 NOTE — Telephone Encounter (Signed)
I left a message regarding schedule  

## 2018-12-04 ENCOUNTER — Encounter (HOSPITAL_COMMUNITY): Payer: Self-pay

## 2018-12-04 ENCOUNTER — Other Ambulatory Visit: Payer: Self-pay

## 2018-12-04 ENCOUNTER — Emergency Department (HOSPITAL_COMMUNITY)
Admission: EM | Admit: 2018-12-04 | Discharge: 2018-12-04 | Disposition: A | Discharge status: Home / Self Care | Payer: 59 | Attending: Emergency Medicine | Admitting: Emergency Medicine

## 2018-12-04 DIAGNOSIS — H9201 Otalgia, right ear: Secondary | ICD-10-CM | POA: Diagnosis not present

## 2018-12-04 DIAGNOSIS — Z5321 Procedure and treatment not carried out due to patient leaving prior to being seen by health care provider: Secondary | ICD-10-CM | POA: Diagnosis not present

## 2018-12-04 DIAGNOSIS — R220 Localized swelling, mass and lump, head: Secondary | ICD-10-CM | POA: Diagnosis present

## 2018-12-04 NOTE — ED Notes (Signed)
Pt LWBS d/t wait time. Ambulated out of the ED in no distress.

## 2018-12-04 NOTE — ED Triage Notes (Signed)
Pt reports a sore lump behind her R ear, R ear pain, and chin pain since yesterday. She denies injury.

## 2018-12-05 ENCOUNTER — Encounter (HOSPITAL_COMMUNITY): Payer: Self-pay

## 2018-12-05 ENCOUNTER — Ambulatory Visit (HOSPITAL_COMMUNITY)
Admission: EM | Admit: 2018-12-05 | Discharge: 2018-12-05 | Disposition: A | Discharge status: Home / Self Care | Payer: 59

## 2018-12-05 ENCOUNTER — Other Ambulatory Visit: Payer: Self-pay

## 2018-12-05 DIAGNOSIS — L239 Allergic contact dermatitis, unspecified cause: Secondary | ICD-10-CM

## 2018-12-05 NOTE — Discharge Instructions (Signed)
You can take over-the-counter Benadryl or Zyrtec as needed for itching.  You can put over-the-counter hydrocortisone cream or antibiotic cream on the area behind your ear.    Return here if you develop other symptoms such as fever, chills, rash, worsening pain, or other concerns.    Go to the emergency department if you develop difficulty swallowing or breathing.

## 2018-12-05 NOTE — ED Triage Notes (Signed)
Pt presents with right ear pain and right jaw/neck pain & numbness.  Pt states she started feeling after she had her chemo treatment a few days ago.
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
## 2018-12-07 ENCOUNTER — Other Ambulatory Visit: Payer: Self-pay | Admitting: Hematology and Oncology

## 2018-12-07 DIAGNOSIS — B20 Human immunodeficiency virus [HIV] disease: Secondary | ICD-10-CM

## 2018-12-07 MED ORDER — TRAMADOL HCL 50 MG PO TABS: ORAL_TABLET | ORAL | 0 refills | Status: DC

## 2018-12-19 ENCOUNTER — Telehealth: Payer: Self-pay

## 2018-12-19 ENCOUNTER — Other Ambulatory Visit: Payer: Self-pay | Admitting: Hematology and Oncology

## 2018-12-19 DIAGNOSIS — B20 Human immunodeficiency virus [HIV] disease: Secondary | ICD-10-CM

## 2018-12-19 MED ORDER — TRAMADOL HCL 50 MG PO TABS: ORAL_TABLET | ORAL | 0 refills | Status: DC

## 2018-12-19 NOTE — Telephone Encounter (Signed)
Called and left a message that Rx was sent to pharmacy. 

## 2018-12-19 NOTE — Telephone Encounter (Signed)
done

## 2018-12-19 NOTE — Telephone Encounter (Signed)
She called and left a message requesting refill on Tramadol Rx.  

## 2018-12-27 ENCOUNTER — Telehealth: Payer: Self-pay | Admitting: *Deleted

## 2018-12-27 ENCOUNTER — Encounter: Payer: Self-pay | Admitting: Hematology and Oncology

## 2018-12-27 ENCOUNTER — Inpatient Hospital Stay: Payer: 59

## 2018-12-27 ENCOUNTER — Other Ambulatory Visit: Payer: Self-pay | Admitting: Hematology and Oncology

## 2018-12-27 ENCOUNTER — Other Ambulatory Visit: Payer: Self-pay

## 2018-12-27 ENCOUNTER — Inpatient Hospital Stay (HOSPITAL_BASED_OUTPATIENT_CLINIC_OR_DEPARTMENT_OTHER): Payer: 59 | Admitting: Hematology and Oncology

## 2018-12-27 ENCOUNTER — Inpatient Hospital Stay: Payer: 59 | Attending: Hematology and Oncology

## 2018-12-27 VITALS — BP 112/70 | HR 85 | Temp 98.3°F | Resp 17 | Ht 64.0 in | Wt 112.2 lb

## 2018-12-27 DIAGNOSIS — M1990 Unspecified osteoarthritis, unspecified site: Secondary | ICD-10-CM

## 2018-12-27 DIAGNOSIS — F17290 Nicotine dependence, other tobacco product, uncomplicated: Secondary | ICD-10-CM | POA: Diagnosis not present

## 2018-12-27 DIAGNOSIS — C8118 Nodular sclerosis classical Hodgkin lymphoma, lymph nodes of multiple sites: Secondary | ICD-10-CM

## 2018-12-27 DIAGNOSIS — Z5112 Encounter for antineoplastic immunotherapy: Secondary | ICD-10-CM | POA: Insufficient documentation

## 2018-12-27 DIAGNOSIS — Z5111 Encounter for antineoplastic chemotherapy: Secondary | ICD-10-CM | POA: Insufficient documentation

## 2018-12-27 DIAGNOSIS — B009 Herpesviral infection, unspecified: Secondary | ICD-10-CM | POA: Diagnosis not present

## 2018-12-27 DIAGNOSIS — Z79899 Other long term (current) drug therapy: Secondary | ICD-10-CM | POA: Diagnosis not present

## 2018-12-27 DIAGNOSIS — Z95828 Presence of other vascular implants and grafts: Secondary | ICD-10-CM

## 2018-12-27 DIAGNOSIS — C8198 Hodgkin lymphoma, unspecified, lymph nodes of multiple sites: Secondary | ICD-10-CM

## 2018-12-27 LAB — COMPREHENSIVE METABOLIC PANEL
ALT: 17 U/L (ref 0–44)
AST: 17 U/L (ref 15–41)
Albumin: 4.2 g/dL (ref 3.5–5.0)
Alkaline Phosphatase: 101 U/L (ref 38–126)
Anion gap: 9 (ref 5–15)
BUN: 10 mg/dL (ref 6–20)
CO2: 23 mmol/L (ref 22–32)
Calcium: 9.7 mg/dL (ref 8.9–10.3)
Chloride: 109 mmol/L (ref 98–111)
Creatinine, Ser: 1.09 mg/dL — ABNORMAL HIGH (ref 0.44–1.00)
GFR calc Af Amer: >60 mL/min (ref >60)
GFR calc non Af Amer: >60 mL/min (ref >60)
Glucose, Bld: 87 mg/dL (ref 70–99)
Potassium: 3.7 mmol/L (ref 3.5–5.1)
Sodium: 141 mmol/L (ref 135–145)
Total Bilirubin: 0.9 mg/dL (ref 0.3–1.2)
Total Protein: 7.2 g/dL (ref 6.5–8.1)

## 2018-12-27 LAB — CBC WITH DIFFERENTIAL/PLATELET
Abs Immature Granulocytes: 0.02 10*3/uL (ref 0.00–0.07)
Basophils Absolute: 0 10*3/uL (ref 0.0–0.1)
Basophils Relative: 1 %
Eosinophils Absolute: 0.1 10*3/uL (ref 0.0–0.5)
Eosinophils Relative: 1 %
HCT: 39.6 % (ref 36.0–46.0)
Hemoglobin: 13.7 g/dL (ref 12.0–15.0)
Immature Granulocytes: 0 %
Lymphocytes Relative: 27 %
Lymphs Abs: 2.1 10*3/uL (ref 0.7–4.0)
MCH: 34.2 pg — ABNORMAL HIGH (ref 26.0–34.0)
MCHC: 34.6 g/dL (ref 30.0–36.0)
MCV: 98.8 fL (ref 80.0–100.0)
Monocytes Absolute: 0.5 10*3/uL (ref 0.1–1.0)
Monocytes Relative: 7 %
Neutro Abs: 5.1 10*3/uL (ref 1.7–7.7)
Neutrophils Relative %: 64 %
Platelets: 285 10*3/uL (ref 150–400)
RBC: 4.01 MIL/uL (ref 3.87–5.11)
RDW: 13.2 % (ref 11.5–15.5)
WBC: 7.9 10*3/uL (ref 4.0–10.5)
nRBC: 0 % (ref 0.0–0.2)

## 2018-12-27 LAB — TSH: TSH: 2.348 u[IU]/mL (ref 0.308–3.960)

## 2018-12-27 MED ORDER — ACYCLOVIR 5 % EX OINT: 1.0000 "application " | TOPICAL_OINTMENT | CUTANEOUS | 0 refills | Status: AC

## 2018-12-27 MED ORDER — SODIUM CHLORIDE 0.9 % IV SOLN
200.0000 mg | Freq: Once | INTRAVENOUS | Status: AC
  Administered 2018-12-27: 200 mg via INTRAVENOUS
  Filled 2018-12-27: qty 8

## 2018-12-27 MED ORDER — SODIUM CHLORIDE 0.9 % IV SOLN
Freq: Once | INTRAVENOUS | Status: AC
  Administered 2018-12-27: 09:00:00 via INTRAVENOUS
  Filled 2018-12-27: qty 250

## 2018-12-27 MED ORDER — HEPARIN SOD (PORK) LOCK FLUSH 100 UNIT/ML IV SOLN
500.0000 [IU] | Freq: Once | INTRAVENOUS | Status: AC | PRN
  Administered 2018-12-27: 11:00:00 500 [IU]
  Filled 2018-12-27: qty 5

## 2018-12-27 MED ORDER — DOCOSANOL 10 % EX CREA: 1.0000 | TOPICAL_CREAM | Freq: Every day | CUTANEOUS | 0 refills | Status: AC

## 2018-12-27 MED ORDER — SODIUM CHLORIDE 0.9% FLUSH
10.0000 mL | INTRAVENOUS | Status: DC | PRN
  Administered 2018-12-27: 11:00:00 10 mL
  Filled 2018-12-27: qty 10

## 2018-12-27 MED ORDER — SODIUM CHLORIDE 0.9% FLUSH
10.0000 mL | INTRAVENOUS | Status: DC | PRN
  Administered 2018-12-27: 08:00:00 10 mL
  Filled 2018-12-27: qty 10

## 2018-12-27 MED ORDER — LEUPROLIDE ACETATE 7.5 MG IM KIT
7.5000 mg | PACK | Freq: Once | INTRAMUSCULAR | Status: AC
  Administered 2018-12-27: 10:00:00 7.5 mg via INTRAMUSCULAR
  Filled 2018-12-27: qty 7.5

## 2018-12-27 NOTE — Assessment & Plan Note (Signed)
She has multiple chronic joint pain and is dependent on tramadol to control her joint pain She is taking vitamin D supplement as prescribed We discussed refill policy. 

## 2018-12-27 NOTE — Telephone Encounter (Signed)
Her last zofran prescription has 11 refills. She just need to call I sent another prescription Abreva instead. If still not approved she needs to call her ID physician

## 2018-12-27 NOTE — Assessment & Plan Note (Signed)
Her recent CT scan in March 2020 showed no evidence of disease She will continue pembrolizumab indefinitely Due to lack of symptoms, I plan to order imaging study once a year, due next year

## 2018-12-27 NOTE — Telephone Encounter (Signed)
Patient called for a refill of the Zofran- after getting treatment today she is feeling nauseated.   She also states she was unable to get the ointment at the pharmacy, her insurance would not cover it. Is there something else she can take to help with this cold sore?

## 2018-12-27 NOTE — Progress Notes (Signed)
[facility] OFFICE PROGRESS NOTE  Patient Care Team: Care, Evans Blount [facility] as PCP - General (Family Medicine) Comer, Robert W, MD as PCP - Infectious Diseases (Infectious Diseases) Arnold, James G, MD as Consulting Physician (Obstetrics and Gynecology) Wert, Michael B, MD as Consulting Physician (Pulmonary Disease) Hendrickson, Steven C, MD as Consulting Physician (Cardiothoracic Surgery)  ASSESSMENT & PLAN:  Hodgkin lymphoma, nodular sclerosis (HCC) Her recent CT scan in March 2020 showed no evidence of disease She will continue pembrolizumab indefinitely Due to lack of symptoms, I plan to order imaging study once a year, due next year  Herpes simplex virus (HSV) infection She is noted to have herpes simplex infection on the lower lip We discussed treatment options The patient would like to try topical acyclovir If it does not improve, I recommend her to call her infectious disease physician for further management  Inflammatory arthritis She has multiple chronic joint pain and is dependent on tramadol to control her joint pain She is taking vitamin D supplement as prescribed We discussed refill policy.  Cigar smoker We discussed the importance of nicotine cessation The patient is motivated to quit on her own   No orders of the defined types were placed in this encounter.   INTERVAL HISTORY: Please see below for problem oriented charting. She returns for treatment and follow-up She complains of new cold sore on the lower lip recently Denies fever or chills No new lymphadenopathy Denies infusion reaction She continues to have multiple joint pain that could be related to inflammatory arthritis from immunotherapy but well controlled with tramadol She is eating well and gaining weight  SUMMARY OF ONCOLOGIC HISTORY: Oncology History  Hodgkin lymphoma, nodular sclerosis (HCC)  05/06/2014 Imaging   CT scan of the abdomen show diffuse mesenteric  lymphadenopathy.   05/07/2014 Imaging   CT scan of the chest show right thoracic inlet lymphadenopathy   06/07/2014 Procedure   She underwent ultrasound-guided core biopsy of the neck lymph node   06/07/2014 Pathology Results   Accession: [id number] biopsy confirmed diagnosis of Hodgkin lymphoma.   06/15/2014 Imaging   Echocardiogram showed preserved ejection fraction   07/09/2014 - 07/12/2014 Hospital Admission   She was admitted to the hospital for severe anemia.   07/27/2014 Procedure   She had placement of port   07/31/2014 - 09/11/2014 Chemotherapy   She received dose adjusted chemotherapy due to abnormal liver function tests and severe anemia. Treatment was delayed due to noncompliance  and subsequently stopped because the patient failed to keep appointments   01/11/2015 Imaging   Repeat PET CT scan showed response to treatment   01/28/2015 - 06/18/2015 Chemotherapy   ABVD was restarted with full dose.   02/08/2015 - 02/10/2015 Hospital Admission   The patient was admitted to the hospital due to pancytopenia and profuse diarrhea. Cultures were negative. She was placed on ciprofloxacin.   02/11/2015 Adverse Reaction   Treatment was placed on hold due to recent infection.   04/11/2015 Imaging   PET CT scan showed near complete response. Incidental finding of an abnormal bone lesion, indeterminate. She is not symptomatic. Recommendation from [facility] to observe   07/11/2015 Imaging   PET CT scan showed abnormal new bone lesions, suggestive of possible disease progression   07/23/2015 Bone Marrow Biopsy   She underwent bone biopsy   07/23/2015 Pathology Results   Accession: [id number]  biopsy was negative for cancer   11/21/2015 Surgery   She had surgery for ectopic pregnancy     01/29/2016 Imaging   Ct chest, abdomen and pelvis showed pelvic and retroperitoneal lymphadenopathy, as above, concerning for residual disease. There is also a mildly enlarged posterior mediastinal lymph node  measuring 11 mm adjacent to the distal descending thoracic aorta. This may represent an additional focus of disease, but is the only finding of concern in the thorax on today's examination. Sclerosis in the right ilium at site of previously noted metabolically active lesion, grossly unchanged. No other definite osseous lesions are identified on today's examination. Spleen is normal in size and appearance.   02/14/2016 PET scan   Interval disease worsening with new foci of hypermetabolic activity in multiple retroperitoneal and pelvic lymph nodes as well as AP window and left hilar lymph nodes. (Deauville 5). There is also overall worsening of the osseous disease.   05/11/2016 Pathology Results   Diagnosis Lymph node, needle/core biopsy, Left para-aortic retroperitoneal - CLASSICAL HODGKIN LYMPHOMA. - SEE ONCOLOGY TABLE. Microscopic Comment LYMPHOMA Histologic type: Classical Hodgkin lymphoma. Grade (if applicable): N/A Flow cytometry: Not done. Immunohistochemical stains: CD15, CD20, CD3, LCA, PAX-5, CD30 with appropriate controls. Touch preps/imprints: Not performed. Comments: The sections show small needle core biopsy fragments displaying a polymorphous cellular proliferation of small lymphocytes, plasma cells, eosinophils, and large atypical mononuclear and multilobated lymphoid cells with features of Reed-Sternberg cells and variants. This is associated with patchy areas of fibrosis. Immunohistochemical stains were performed and show that the large atypical lymphoid cells are positive for CD30, CD15 and PAX-5 and negative for LCA, CD20, CD3. The small lymphoid cells in the background show a mixture of T and B cells with predominance of T cells. The overall morphologic and histologic features are consistent with classical Hodgkin lymphoma. Further subtyping is challenging in limited small biopsy fragments but the patchy fibrosis suggests nodular sclerosis type.    05/11/2016 Procedure   She  underwent CT guided biopsy of retroperitoneal lymph node   05/26/2016 Procedure   Successful placement of a right IJ approach Power Port with ultrasound and fluoroscopic guidance. The catheter is ready for use.   06/02/2016 PET scan   Mixed response to chemotherapy with some lymph nodes decreased in metabolic activity and some lymph nodes increase metabolic activity. Lymph node stations including mediastinum, periaortic retroperitoneum, and obturator node stations. Activity is remains relatively intense Deauville 4 2. LEFT infrahilar nodule / lymph node with intense metabolic activity decreased from prior. ( Deauville 4 ). 3. 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 Deauville 5   06/03/2016 - 06/05/2016 Hospital Admission   She was admitted to the hospital for cycle 1 of ICE chemotherapy   06/24/2016 - 06/26/2016 Hospital Admission   She received cycle 2 of ICE chemo   07/07/2016 PET scan   Resolution of prior hypermetabolic adenopathy and resolution of prior osseous foci of hypermetabolic activity compatible with essentially complete response to therapy (Deauville 1). 2. Generalized reduced activity in the L4 vertebral body and the type of finding which would typically reflect prior radiation therapy. 3. Stable septated fatty right pelvic lesion, possibly a dermoid or lipoma, not hypermetabolic   10/12/2016 PET scan   Hypermetabolic lesion along the L3 vertebral body and left posterior elements, max SUV 7.8 (Deauville 5). Hypermetabolic lesion along the left inferior pubic ramus, max SUV 4.9 (Deauville 4).  IMPRESSION: Prevascular lymphadenopathy, reflecting nodal recurrence (Deauville 4). Hypermetabolic osseous metastases involving the L3 vertebral body/posterior elements and left inferior pubic ramus (Deauville 4-5). Hypermetabolism along the endometrium, new, possibly reactive/physiologic. Consider pelvic ultrasound and/or   endometrial sampling as clinically warranted.    11/04/2016 - 02/24/2017 Chemotherapy   She received Brentuximab   12/11/2016 PET scan   1. Mixed response to therapy within the skeleton. Lesions at L3 is decreased in size and metabolic activity. Residual activity remains above liver ( Deauville 4) 2. Increased activity in the LEFT sacrum with metabolic activity above liver activity ( Deauville 4 3. Decrease in size and metabolic activity of anterior mediastinal tissue consistent with resolution of thymic hyperplasia or resolution of lymphoma metabolic activity ( Deauville 2). 4. No new lymphadenopathy. Normal spleen and liver.   03/25/2017 PET scan   1. Increased metabolic activity in several normal sized retroperitoneal and right pelvic lymph nodes, Deauville 4. 2. Increase in size and metabolic activity of lesions in the L3 and L4 vertebral bodies and in the left sacrum, Deauville 5. 3. No recurrence of anterior mediastinal abnormal activity. 4. Other imaging findings of potential clinical significance: Chronic ethmoid and left maxillary sinusitis. Paraseptal emphysema at the lung apices. Right ovarian dermoid.   04/06/2017 -  Chemotherapy   The patient had Keytruda   06/09/2017 PET scan   1. Overall significant improvement with reduction in nodal activity. Several retroperitoneal nodes which were previously Deauville 4 are currently Deauville 3. The right common iliac lymph node remains at Deauville 4 with maximum SUV of 3.4, but is improved from prior SUV of 4.3. 2. The bony lesions show the greatest improvement, with the previously Deauville 5 hypermetabolic lesions currently no longer of higher metabolic activity than surrounding bone marrow, currently measuring at Deauville 3. 3. 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 4. No new lesions are identified. 5. Emphysema (ICD10-J43.9).   08/05/2017 Imaging   1. Somewhat complex left adnexal lesion, potentially hemorrhagic cyst measuring 2.9 x  2.8 cm. The possibility of torsion in the left ovary must be considered. Advise correlation with pelvic ultrasound including Doppler assessment to further evaluate.  2.  Right ovarian dermoid, unchanged from recent PET-CT examination.  3. No adenopathy by size criteria evident. Appearance of subcentimeter mesenteric lymph nodes is stable compared to recent study.  4. No bone lesions appreciable by CT. Areas of abnormal radiotracer uptake in the mid lumbar spine noted on recent PET-CT examination. No destruction 2 or lytic change noted in the lumbar vertebrae currently there.  5. No bowel obstruction. No abscess. No periappendiceal region inflammation.  6.  Small hiatal hernia with fluid in distal esophagus.  7.  Foci of coronary artery calcification, advanced for age.  8.  Spleen normal in size and contour.  9. Small hiatal hernia with mild fluid in the distal esophagus, likely indicative of a degree of reflux.   01/04/2018 Imaging   1. No findings to suggest residual/recurrent lymphoma in the chest, abdomen or pelvis. 2. Right ovarian dermoid slightly larger than prior examinations, currently measuring 3.8 x 2.8 x 3.1 cm. 3. Additional incidental findings, as above.   07/11/2018 Imaging   No findings suspicious for recurrent lymphoma. Spleen is normal in size.  Stable right ovarian dermoid.  4 mm subpleural nodular opacity in the posterior right lower lobe, favoring subpleural atelectasis.     REVIEW OF SYSTEMS:   Constitutional: Denies fevers, chills or abnormal weight loss Eyes: Denies blurriness of vision Ears, nose, mouth, throat, and face: Denies mucositis or sore throat Respiratory: Denies cough, dyspnea or wheezes Cardiovascular: Denies palpitation, chest discomfort or lower extremity swelling Gastrointestinal:  Denies nausea, heartburn or change in bowel habits Lymphatics:   Denies new lymphadenopathy or easy bruising Neurological:Denies numbness, tingling or  new weaknesses Behavioral/Psych: Mood is stable, no new changes  All other systems were reviewed with the patient and are negative.  I have reviewed the past medical history, past surgical history, social history and family history with the patient and they are unchanged from previous note.  ALLERGIES:  is allergic to temazepam.  MEDICATIONS:  Current Outpatient Medications  Medication Sig Dispense Refill  . acyclovir ointment (ZOVIRAX) 5 % Apply 1 application topically every 3 (three) hours for 7 days. 15 g 0  . atazanavir (REYATAZ) 300 MG capsule TAKE ONE CAPSULE BY MOUTH DAILY WITH BREAKFAST. TAKE WITH NORVIR 90 capsule 1  . cholecalciferol (VITAMIN D) 1000 units tablet Take 1 tablet (1,000 Units total) by mouth daily. 30 tablet 9  . Clotrimazole 1 % OINT Apply 1 application topically 2 (two) times daily. 56.7 g 1  . emtricitabine-tenofovir (TRUVADA) 200-300 MG tablet Take 1 tablet by mouth daily. 90 tablet 1  . fluticasone (FLONASE) 50 MCG/ACT nasal spray SHAKE LIQUID AND USE 1 SPRAY IN EACH NOSTRIL DAILY 16 g 0  . hydrocortisone 1 % ointment Apply 1 application topically 2 (two) times daily. To affected area 30 g 2  . ondansetron (ZOFRAN ODT) 8 MG disintegrating tablet Take 1 tablet (8 mg total) by mouth every 8 (eight) hours as needed for nausea. 30 tablet 11  . ritonavir (NORVIR) 100 MG TABS tablet TAKE 1 TABLET BY MOUTH EVERY MORNING WITH BREAKFAST. TAKE WITH REYATAZ 90 tablet 1  . traMADol (ULTRAM) 50 MG tablet TAKE 2 TABLETS(100 MG) BY MOUTH EVERY 6 HOURS AS NEEDED FOR MODERATE PAIN 90 tablet 0   No current facility-administered medications for this visit.     PHYSICAL EXAMINATION: ECOG PERFORMANCE STATUS: 1 - Symptomatic but completely ambulatory  Vitals:   12/27/18 0828  BP: 112/70  Pulse: 85  Resp: 17  Temp: 98.3 F (36.8 C)  SpO2: 99%   Filed Weights   12/27/18 0828  Weight: 112 lb 3.2 oz (50.9 kg)    GENERAL:alert, no distress and comfortable SKIN: Noted cold  sore affecting her lower lip EYES: normal, Conjunctiva are pink and non-injected, sclera clear OROPHARYNX:no exudate, no erythema and lips, buccal mucosa, and tongue normal  NECK: supple, thyroid normal size, non-tender, without nodularity LYMPH:  no palpable lymphadenopathy in the cervical, axillary or inguinal LUNGS: clear to auscultation and percussion with normal breathing effort HEART: regular rate & rhythm and no murmurs and no lower extremity edema ABDOMEN:abdomen soft, non-tender and normal bowel sounds Musculoskeletal:no cyanosis of digits and no clubbing  NEURO: alert & oriented x 3 with fluent speech, no focal motor/sensory deficits  LABORATORY DATA:  I have reviewed the data as listed    Component Value Date/Time   NA 140 11/29/2018 0906   NA 140 04/29/2017 0745   K 3.9 11/29/2018 0906   K 3.3 (L) 04/29/2017 0745   CL 108 11/29/2018 0906   CO2 23 11/29/2018 0906   CO2 23 04/29/2017 0745   GLUCOSE 95 11/29/2018 0906   GLUCOSE 83 04/29/2017 0745   BUN 12 11/29/2018 0906   BUN 10.7 04/29/2017 0745   CREATININE 1.01 (H) 11/29/2018 0906   CREATININE 1.2 (H) 04/29/2017 0745   CALCIUM 9.8 11/29/2018 0906   CALCIUM 9.8 04/29/2017 0745   PROT 7.0 11/29/2018 0906   PROT 7.6 04/29/2017 0745   ALBUMIN 3.9 11/29/2018 0906   ALBUMIN 4.0 04/29/2017 0745   AST 13 (L) 11/29/2018 9371  AST 19 04/29/2017 0745   ALT 16 11/29/2018 0906   ALT 19 04/29/2017 0745   ALKPHOS 86 11/29/2018 0906   ALKPHOS 81 04/29/2017 0745   BILITOT 0.8 11/29/2018 0906   BILITOT 1.31 (H) 04/29/2017 0745   GFRNONAA >60 11/29/2018 0906   GFRNONAA 83 02/20/2016 0908   GFRAA >60 11/29/2018 0906   GFRAA >89 02/20/2016 0908    No results found for: SPEP, UPEP  Lab Results  Component Value Date   WBC 7.9 12/27/2018   NEUTROABS 5.1 12/27/2018   HGB 13.7 12/27/2018   HCT 39.6 12/27/2018   MCV 98.8 12/27/2018   PLT 285 12/27/2018      Chemistry      Component Value Date/Time   NA 140 11/29/2018  0906   NA 140 04/29/2017 0745   K 3.9 11/29/2018 0906   K 3.3 (L) 04/29/2017 0745   CL 108 11/29/2018 0906   CO2 23 11/29/2018 0906   CO2 23 04/29/2017 0745   BUN 12 11/29/2018 0906   BUN 10.7 04/29/2017 0745   CREATININE 1.01 (H) 11/29/2018 0906   CREATININE 1.2 (H) 04/29/2017 0745      Component Value Date/Time   CALCIUM 9.8 11/29/2018 0906   CALCIUM 9.8 04/29/2017 0745   ALKPHOS 86 11/29/2018 0906   ALKPHOS 81 04/29/2017 0745   AST 13 (L) 11/29/2018 0906   AST 19 04/29/2017 0745   ALT 16 11/29/2018 0906   ALT 19 04/29/2017 0745   BILITOT 0.8 11/29/2018 0906   BILITOT 1.31 (H) 04/29/2017 0745       All questions were answered. The patient knows to call the clinic with any problems, questions or concerns. No barriers to learning was detected.  I spent 15 minutes counseling the patient face to face. The total time spent in the appointment was 20 minutes and more than 50% was on counseling and review of test results  Heath Lark, MD 12/27/2018 9:01 AM

## 2018-12-27 NOTE — Patient Instructions (Signed)
[facility] Discharge Instructions for Patients Receiving Chemotherapy  Today you received the following chemotherapy agents:  Keytruda.  To help prevent nausea and vomiting after your treatment, we encourage you to take your nausea medication as directed.   If you develop nausea and vomiting that is not controlled by your nausea medication, call the clinic.   BELOW ARE SYMPTOMS THAT SHOULD BE REPORTED IMMEDIATELY:  *FEVER GREATER THAN 100.5 F  *CHILLS WITH OR WITHOUT FEVER  NAUSEA AND VOMITING THAT IS NOT CONTROLLED WITH YOUR NAUSEA MEDICATION  *UNUSUAL SHORTNESS OF BREATH  *UNUSUAL BRUISING OR BLEEDING  TENDERNESS IN MOUTH AND THROAT WITH OR WITHOUT PRESENCE OF ULCERS  *URINARY PROBLEMS  *BOWEL PROBLEMS  UNUSUAL RASH Items with * indicate a potential emergency and should be followed up as soon as possible.  Feel free to call the clinic should you have any questions or concerns. The clinic phone number is [phone].  Please show the CHEMO ALERT CARD at check-in to the Emergency Department and triage nurse.    

## 2018-12-27 NOTE — Assessment & Plan Note (Signed)
We discussed the importance of nicotine cessation The patient is motivated to quit on her own 

## 2018-12-27 NOTE — Assessment & Plan Note (Signed)
She is noted to have herpes simplex infection on the lower lip We discussed treatment options The patient would like to try topical acyclovir If it does not improve, I recommend her to call her infectious disease physician for further management

## 2018-12-28 ENCOUNTER — Telehealth: Payer: Self-pay | Admitting: *Deleted

## 2018-12-28 ENCOUNTER — Other Ambulatory Visit: Payer: Self-pay | Admitting: *Deleted

## 2018-12-28 MED ORDER — ONDANSETRON 8 MG PO TBDP: 8.0000 mg | ORAL_TABLET | Freq: Three times a day (TID) | ORAL | 11 refills | Status: DC | PRN

## 2018-12-28 NOTE — Telephone Encounter (Signed)
[facility] faxed Prior authorization request for Acyclovir 5% oint.  Request to [employer] letter tray receptacle of Prior Authorization forms for review.

## 2018-12-30 ENCOUNTER — Other Ambulatory Visit: Payer: Self-pay | Admitting: Hematology and Oncology

## 2018-12-30 ENCOUNTER — Telehealth: Payer: Self-pay

## 2018-12-30 DIAGNOSIS — B20 Human immunodeficiency virus [HIV] disease: Secondary | ICD-10-CM

## 2018-12-30 MED ORDER — TRAMADOL HCL 50 MG PO TABS: ORAL_TABLET | ORAL | 0 refills | Status: DC

## 2018-12-30 NOTE — Telephone Encounter (Signed)
Called and given below message. She verbalized understanding. 

## 2018-12-30 NOTE — Telephone Encounter (Signed)
She called and left a message requesting refill on Tramadol. 

## 2018-12-30 NOTE — Telephone Encounter (Signed)
done

## 2019-01-06 ENCOUNTER — Telehealth: Payer: Self-pay

## 2019-01-06 NOTE — Telephone Encounter (Signed)
She called and left a message to call her. Called back she is having itching after getting out of the shower that has been going on for 2 weeks. She has large red areas that come up after getting out of the shower. She has been using hydrocortisone cream but it has got so bad she is unable to apply cream. She has been taking benadryl only at night due to it making her so sleepy.  Called back and instructed to use hydrocortisone cream 1% to areas. Offered Prednisone, per Dr. Alvy Bimler for 20 mg for 5 days. She has Rx at home and does not like to take it. She will try Prednisone for a few days. Instructed to call the office back if needed. She verbalized understanding.

## 2019-01-09 ENCOUNTER — Telehealth: Payer: Self-pay | Admitting: *Deleted

## 2019-01-09 ENCOUNTER — Other Ambulatory Visit: Payer: Self-pay | Admitting: Hematology and Oncology

## 2019-01-09 DIAGNOSIS — B20 Human immunodeficiency virus [HIV] disease: Secondary | ICD-10-CM

## 2019-01-09 MED ORDER — TRAMADOL HCL 50 MG PO TABS: ORAL_TABLET | ORAL | 0 refills | Status: DC

## 2019-01-09 NOTE — Telephone Encounter (Signed)
Patient calling for a refill on Tramadol please to [facility] on [address]

## 2019-01-09 NOTE — Telephone Encounter (Signed)
done

## 2019-01-13 ENCOUNTER — Telehealth: Payer: Self-pay | Admitting: *Deleted

## 2019-01-13 MED ORDER — PREDNISONE 20 MG PO TABS: 20.0000 mg | ORAL_TABLET | Freq: Every day | ORAL | 0 refills | Status: DC

## 2019-01-13 NOTE — Telephone Encounter (Signed)
pls call in prednisone 20 mg PO x 7 days no refills

## 2019-01-13 NOTE — Telephone Encounter (Signed)
Telephone call returned to patient. Advised prednisone sent to her pharmacy. She appreciated the call and will call us again if she needs anything further.

## 2019-01-13 NOTE — Progress Notes (Signed)
Spoke w/ Dr. Alvy Bimler, change Lupron 7.5mg  monthly to goserelin 3.6mg  monthly d/t drug shortage.   Demetrius Charity, PharmD, [facility] Pharmac[facility] Pharmacy Phone: [phone] 01/13/2019

## 2019-01-13 NOTE — Telephone Encounter (Signed)
Telephone call from patient she is itching all over her body, she has hives that come and go. This is nothing new for her She is asking for another round of prednisone like she had prior.

## 2019-01-16 ENCOUNTER — Other Ambulatory Visit: Payer: Self-pay | Admitting: Hematology and Oncology

## 2019-01-16 DIAGNOSIS — C8118 Nodular sclerosis classical Hodgkin lymphoma, lymph nodes of multiple sites: Secondary | ICD-10-CM

## 2019-01-16 DIAGNOSIS — Z79899 Other long term (current) drug therapy: Secondary | ICD-10-CM

## 2019-01-16 DIAGNOSIS — D270 Benign neoplasm of right ovary: Secondary | ICD-10-CM

## 2019-01-19 ENCOUNTER — Other Ambulatory Visit: Payer: Self-pay | Admitting: Hematology and Oncology

## 2019-01-19 ENCOUNTER — Telehealth: Payer: Self-pay | Admitting: *Deleted

## 2019-01-19 DIAGNOSIS — B20 Human immunodeficiency virus [HIV] disease: Secondary | ICD-10-CM

## 2019-01-19 MED ORDER — TRAMADOL HCL 50 MG PO TABS: ORAL_TABLET | ORAL | 0 refills | Status: DC

## 2019-01-19 NOTE — Telephone Encounter (Signed)
done

## 2019-01-19 NOTE — Telephone Encounter (Signed)
Patient calling for a tramadol refill please. 

## 2019-01-24 ENCOUNTER — Other Ambulatory Visit: Payer: Self-pay

## 2019-01-24 ENCOUNTER — Inpatient Hospital Stay: Payer: 59

## 2019-01-24 ENCOUNTER — Inpatient Hospital Stay (HOSPITAL_BASED_OUTPATIENT_CLINIC_OR_DEPARTMENT_OTHER): Payer: 59 | Admitting: Hematology and Oncology

## 2019-01-24 ENCOUNTER — Encounter: Payer: Self-pay | Admitting: Hematology and Oncology

## 2019-01-24 DIAGNOSIS — Z5112 Encounter for antineoplastic immunotherapy: Secondary | ICD-10-CM | POA: Diagnosis not present

## 2019-01-24 DIAGNOSIS — Z95828 Presence of other vascular implants and grafts: Secondary | ICD-10-CM

## 2019-01-24 DIAGNOSIS — L299 Pruritus, unspecified: Secondary | ICD-10-CM

## 2019-01-24 DIAGNOSIS — C8118 Nodular sclerosis classical Hodgkin lymphoma, lymph nodes of multiple sites: Secondary | ICD-10-CM

## 2019-01-24 DIAGNOSIS — C8198 Hodgkin lymphoma, unspecified, lymph nodes of multiple sites: Secondary | ICD-10-CM

## 2019-01-24 DIAGNOSIS — G893 Neoplasm related pain (acute) (chronic): Secondary | ICD-10-CM

## 2019-01-24 DIAGNOSIS — Z5111 Encounter for antineoplastic chemotherapy: Secondary | ICD-10-CM

## 2019-01-24 LAB — CBC WITH DIFFERENTIAL/PLATELET
Abs Immature Granulocytes: 0.02 10*3/uL (ref 0.00–0.07)
Basophils Absolute: 0.1 10*3/uL (ref 0.0–0.1)
Basophils Relative: 1 %
Eosinophils Absolute: 0.1 10*3/uL (ref 0.0–0.5)
Eosinophils Relative: 1 %
HCT: 38.9 % (ref 36.0–46.0)
Hemoglobin: 13.5 g/dL (ref 12.0–15.0)
Immature Granulocytes: 0 %
Lymphocytes Relative: 32 %
Lymphs Abs: 2.4 10*3/uL (ref 0.7–4.0)
MCH: 34.1 pg — ABNORMAL HIGH (ref 26.0–34.0)
MCHC: 34.7 g/dL (ref 30.0–36.0)
MCV: 98.2 fL (ref 80.0–100.0)
Monocytes Absolute: 0.6 10*3/uL (ref 0.1–1.0)
Monocytes Relative: 8 %
Neutro Abs: 4.4 10*3/uL (ref 1.7–7.7)
Neutrophils Relative %: 58 %
Platelets: 254 10*3/uL (ref 150–400)
RBC: 3.96 MIL/uL (ref 3.87–5.11)
RDW: 13.1 % (ref 11.5–15.5)
WBC: 7.6 10*3/uL (ref 4.0–10.5)
nRBC: 0 % (ref 0.0–0.2)

## 2019-01-24 LAB — COMPREHENSIVE METABOLIC PANEL
ALT: 14 U/L (ref 0–44)
AST: 15 U/L (ref 15–41)
Albumin: 4.1 g/dL (ref 3.5–5.0)
Alkaline Phosphatase: 89 U/L (ref 38–126)
Anion gap: 9 (ref 5–15)
BUN: 14 mg/dL (ref 6–20)
CO2: 23 mmol/L (ref 22–32)
Calcium: 9.5 mg/dL (ref 8.9–10.3)
Chloride: 109 mmol/L (ref 98–111)
Creatinine, Ser: 1 mg/dL (ref 0.44–1.00)
GFR calc Af Amer: >60 mL/min (ref >60)
GFR calc non Af Amer: >60 mL/min (ref >60)
Glucose, Bld: 87 mg/dL (ref 70–99)
Potassium: 3.7 mmol/L (ref 3.5–5.1)
Sodium: 141 mmol/L (ref 135–145)
Total Bilirubin: 0.8 mg/dL (ref 0.3–1.2)
Total Protein: 6.7 g/dL (ref 6.5–8.1)

## 2019-01-24 LAB — TSH: TSH: 1.603 u[IU]/mL (ref 0.308–3.960)

## 2019-01-24 MED ORDER — GOSERELIN ACETATE 3.6 MG ~~LOC~~ IMPL
3.6000 mg | DRUG_IMPLANT | Freq: Once | SUBCUTANEOUS | Status: AC
  Administered 2019-01-24: 11:00:00 3.6 mg via SUBCUTANEOUS

## 2019-01-24 MED ORDER — SODIUM CHLORIDE 0.9 % IV SOLN
Freq: Once | INTRAVENOUS | Status: AC
  Administered 2019-01-24: 11:00:00 via INTRAVENOUS
  Filled 2019-01-24: qty 250

## 2019-01-24 MED ORDER — HYDROXYZINE HCL 10 MG PO TABS: 10.0000 mg | ORAL_TABLET | Freq: Three times a day (TID) | ORAL | 0 refills | Status: DC | PRN

## 2019-01-24 MED ORDER — SODIUM CHLORIDE 0.9% FLUSH
10.0000 mL | INTRAVENOUS | Status: DC | PRN
  Administered 2019-01-24: 12:00:00 10 mL
  Filled 2019-01-24: qty 10

## 2019-01-24 MED ORDER — SODIUM CHLORIDE 0.9 % IV SOLN
200.0000 mg | Freq: Once | INTRAVENOUS | Status: AC
  Administered 2019-01-24: 200 mg via INTRAVENOUS
  Filled 2019-01-24: qty 8

## 2019-01-24 MED ORDER — GOSERELIN ACETATE 3.6 MG ~~LOC~~ IMPL
DRUG_IMPLANT | SUBCUTANEOUS | Status: AC
  Filled 2019-01-24: qty 3.6

## 2019-01-24 MED ORDER — SODIUM CHLORIDE 0.9% FLUSH
10.0000 mL | INTRAVENOUS | Status: DC | PRN
  Administered 2019-01-24: 10:00:00 10 mL
  Filled 2019-01-24: qty 10

## 2019-01-24 MED ORDER — HEPARIN SOD (PORK) LOCK FLUSH 100 UNIT/ML IV SOLN
500.0000 [IU] | Freq: Once | INTRAVENOUS | Status: AC | PRN
  Administered 2019-01-24: 12:00:00 500 [IU]
  Filled 2019-01-24: qty 5

## 2019-01-24 NOTE — Assessment & Plan Note (Signed)
She is starting to develop some minor hives with treatment The hives is difficult to treat despite hydrocortisone cream and prednisone It is resolving We discussed changes in treatment plan Ultimately, she agreed to change the frequency of treatment to every other month after today's dose

## 2019-01-24 NOTE — Assessment & Plan Note (Signed)
She has multiple chronic joint pain and is dependent on tramadol to control her joint pain She is taking vitamin D supplement as prescribed We discussed refill policy. 

## 2019-01-24 NOTE — Assessment & Plan Note (Signed)
She has skin hives that does not resolve with hydrocortisone cream or prednisone She is in agreement to try anti-itching medicine to help her cope with the side effect

## 2019-01-24 NOTE — Progress Notes (Signed)
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[place]) She is starting to develop some minor hives with treatment The hives is difficult to treat despite hydrocortisone cream and prednisone It is resolving We discussed changes in treatment plan Ultimately, she agreed to change the frequency of treatment to every other month after today's dose  Itch of skin She has skin hives that does not resolve with hydrocortisone cream or prednisone She is in agreement to try anti-itching medicine to help her cope with the side effect  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   No orders of the defined types were placed in this encounter.   INTERVAL HISTORY: Please see below for problem oriented charting. 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   05/07/2014 Imaging   CT scan of the chest show right thoracic inlet lymphadenopathy   06/07/2014 Procedure   She underwent ultrasound-guided core biopsy of the neck lymph node   06/07/2014 Pathology Results   Accession: [id number] biopsy confirmed diagnosis of Hodgkin lymphoma.   06/15/2014 Imaging   Echocardiogram showed preserved ejection fraction   07/09/2014 - 07/12/2014 Hospital Admission   She was admitted to the hospital for severe anemia.   07/27/2014 Procedure   She had placement of port   07/31/2014 - 09/11/2014 Chemotherapy   She received dose adjusted chemotherapy due to abnormal liver function tests and severe anemia. Treatment was delayed due to noncompliance  and subsequently stopped because the patient failed to keep appointments   01/11/2015 Imaging   Repeat PET CT scan showed response to treatment   01/28/2015 - 06/18/2015 Chemotherapy   ABVD was restarted with full dose.   02/08/2015 - 02/10/2015 Hospital Admission   The patient was admitted to the hospital due to pancytopenia and profuse diarrhea. Cultures were negative. She was placed on ciprofloxacin.   02/11/2015 Adverse Reaction   Treatment was placed on hold due to recent infection.   04/11/2015 Imaging   PET CT scan showed near complete response. Incidental finding of an abnormal bone lesion, indeterminate. She is not symptomatic. Recommendation from [facility] to observe   07/11/2015 Imaging   PET CT scan showed abnormal new bone lesions, suggestive of possible disease progression   07/23/2015 Bone Marrow Biopsy   She underwent bone biopsy   07/23/2015 Pathology Results   Accession: [id number]  biopsy was negative for cancer   11/21/2015 Surgery   She had surgery for ectopic pregnancy   01/29/2016 Imaging   Ct chest, abdomen and pelvis showed pelvic and retroperitoneal lymphadenopathy, as above, concerning for residual disease. There is also a mildly enlarged posterior mediastinal lymph node measuring 11 mm adjacent to the distal descending thoracic aorta. This may represent an additional focus of disease, but is the only finding of concern  in the thorax on today's examination. Sclerosis in the right ilium at site of previously noted metabolically active lesion, grossly unchanged. No other definite osseous lesions  are identified on today's examination. Spleen is normal in size and appearance.   02/14/2016 PET scan   Interval disease worsening with new foci of hypermetabolic activity in multiple retroperitoneal and pelvic lymph nodes as well as AP window and left hilar lymph nodes. (Deauville 5). There is also overall worsening of the osseous disease.   05/11/2016 Pathology Results   Diagnosis Lymph node, needle/core biopsy, Left para-aortic retroperitoneal - CLASSICAL HODGKIN LYMPHOMA. - SEE ONCOLOGY TABLE. Microscopic Comment LYMPHOMA Histologic type: Classical Hodgkin lymphoma. Grade (if applicable): N/A Flow cytometry: Not done. Immunohistochemical stains: CD15, CD20, CD3, LCA, PAX-5, CD30 with appropriate controls. Touch preps/imprints: Not performed. Comments: The sections show small needle core biopsy fragments displaying a polymorphous cellular proliferation of small lymphocytes, plasma cells, eosinophils, and large atypical mononuclear and multilobated lymphoid cells with features of Reed-Sternberg cells and variants. This is associated with patchy areas of fibrosis. Immunohistochemical stains were performed and show that the large atypical lymphoid cells are positive for CD30, CD15 and PAX-5 and negative for LCA, CD20, CD3. The small lymphoid cells in the background show a mixture of T and B cells with predominance of T cells. The overall morphologic and histologic features are consistent with classical Hodgkin lymphoma. Further subtyping is challenging in limited small biopsy fragments but the patchy fibrosis suggests nodular sclerosis type.    05/11/2016 Procedure   She underwent CT guided biopsy of retroperitoneal lymph node   05/26/2016 Procedure   Successful placement of a right IJ approach Power Port with ultrasound and fluoroscopic guidance. The catheter is ready for use.   06/02/2016 PET scan   Mixed response to chemotherapy with some lymph nodes decreased in metabolic activity and some  lymph nodes increase metabolic activity. Lymph node stations including mediastinum, periaortic retroperitoneum, and obturator node stations. Activity is remains relatively intense Deauville 4 2. LEFT infrahilar nodule / lymph node with intense metabolic activity decreased from prior. ( Deauville 4 ). 3. 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 Deauville 5   06/03/2016 - 06/05/2016 Hospital Admission   She was admitted to the hospital for cycle 1 of ICE chemotherapy   06/24/2016 - 06/26/2016 Hospital Admission   She received cycle 2 of ICE chemo   07/07/2016 PET scan   Resolution of prior hypermetabolic adenopathy and resolution of prior osseous foci of hypermetabolic activity compatible with essentially complete response to therapy (Deauville 1). 2. Generalized reduced activity in the L4 vertebral body and the type of finding which would typically reflect prior radiation therapy. 3. Stable septated fatty right pelvic lesion, possibly a dermoid or lipoma, not hypermetabolic   01/11/9149 PET scan   Hypermetabolic lesion along the L3 vertebral body and left posterior elements, max SUV 7.8 (Deauville 5). Hypermetabolic lesion along the left inferior pubic ramus, max SUV 4.9 (Deauville 4).  IMPRESSION: Prevascular lymphadenopathy, reflecting nodal recurrence (Deauville 4). Hypermetabolic osseous metastases involving the L3 vertebral body/posterior elements and left inferior pubic ramus (Deauville 4-5). Hypermetabolism along the endometrium, new, possibly reactive/physiologic. Consider pelvic ultrasound and/or endometrial sampling as clinically warranted.   11/04/2016 - 02/24/2017 Chemotherapy   She received Brentuximab   12/11/2016 PET scan   1. Mixed response to therapy within the skeleton. Lesions at L3 is decreased in size and metabolic activity. Residual activity remains above liver ( Deauville 4) 2. Increased activity in the LEFT  sacrum with metabolic activity above liver  activity ( Deauville 4 3. Decrease in size and metabolic activity of anterior mediastinal tissue consistent with resolution of thymic hyperplasia or resolution of lymphoma metabolic activity ( Deauville 2). 4. No new lymphadenopathy. Normal spleen and liver.   03/25/2017 PET scan   1. Increased metabolic activity in several normal sized retroperitoneal and right pelvic lymph nodes, Deauville 4. 2. Increase in size and metabolic activity of lesions in the L3 and L4 vertebral bodies and in the left sacrum, Deauville 5. 3. No recurrence of anterior mediastinal abnormal activity. 4. Other imaging findings of potential clinical significance: Chronic ethmoid and left maxillary sinusitis. Paraseptal emphysema at the lung apices. Right ovarian dermoid.   04/06/2017 -  Chemotherapy   The patient had Keytruda   06/09/2017 PET scan   1. Overall significant improvement with reduction in nodal activity. Several retroperitoneal nodes which were previously Deauville 4 are currently Deauville 3. The right common iliac lymph node remains at Deauville 4 with maximum SUV of 3.4, but is improved from prior SUV of 4.3. 2. The bony lesions show the greatest improvement, with the previously Deauville 5 hypermetabolic lesions currently no longer of higher metabolic activity than surrounding bone marrow, currently measuring at Deauville 3. 3. 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 4. No new lesions are identified. 5. Emphysema (ICD10-J43.9).   08/05/2017 Imaging   1. Somewhat complex left adnexal lesion, potentially hemorrhagic cyst measuring 2.9 x 2.8 cm. The possibility of torsion in the left ovary must be considered. Advise correlation with pelvic ultrasound including Doppler assessment to further evaluate.  2.  Right ovarian dermoid, unchanged from recent PET-CT examination.  3. No adenopathy by size criteria evident. Appearance of subcentimeter mesenteric lymph  nodes is stable compared to recent study.  4. No bone lesions appreciable by CT. Areas of abnormal radiotracer uptake in the mid lumbar spine noted on recent PET-CT examination. No destruction 2 or lytic change noted in the lumbar vertebrae currently there.  5. No bowel obstruction. No abscess. No periappendiceal region inflammation.  6.  Small hiatal hernia with fluid in distal esophagus.  7.  Foci of coronary artery calcification, advanced for age.  8.  Spleen normal in size and contour.  9. Small hiatal hernia with mild fluid in the distal esophagus, likely indicative of a degree of reflux.   01/04/2018 Imaging   1. No findings to suggest residual/recurrent lymphoma in the chest, abdomen or pelvis. 2. Right ovarian dermoid slightly larger than prior examinations, currently measuring 3.8 x 2.8 x 3.1 cm. 3. Additional incidental findings, as above.   07/11/2018 Imaging   No findings suspicious for recurrent lymphoma. Spleen is normal in size.  Stable right ovarian dermoid.  4 mm subpleural nodular opacity in the posterior right lower lobe, favoring subpleural atelectasis.     REVIEW OF SYSTEMS:   Constitutional: Denies fevers, chills or abnormal weight loss Eyes: Denies blurriness of vision Ears, nose, mouth, throat, and face: Denies mucositis or sore throat Respiratory: Denies cough, dyspnea or wheezes Cardiovascular: Denies palpitation, chest discomfort or lower extremity swelling Gastrointestinal:  Denies nausea, heartburn or change in bowel habits Lymphatics: Denies new lymphadenopathy or easy bruising Neurological:Denies numbness, tingling or new weaknesses Behavioral/Psych: Mood is stable, no new changes  All other systems were reviewed with the patient and are negative.  I have reviewed the past medical history, past surgical history, social history and family history with the patient and they are unchanged from  previous note.  ALLERGIES:  is allergic to  temazepam.  MEDICATIONS:  Current Outpatient Medications  Medication Sig Dispense Refill  . atazanavir (REYATAZ) 300 MG capsule TAKE ONE CAPSULE BY MOUTH DAILY WITH BREAKFAST. TAKE WITH NORVIR 90 capsule 1  . cholecalciferol (VITAMIN D) 1000 units tablet Take 1 tablet (1,000 Units total) by mouth daily. 30 tablet 9  . Clotrimazole 1 % OINT Apply 1 application topically 2 (two) times daily. 56.7 g 1  . emtricitabine-tenofovir (TRUVADA) 200-300 MG tablet Take 1 tablet by mouth daily. 90 tablet 1  . fluticasone (FLONASE) 50 MCG/ACT nasal spray SHAKE LIQUID AND USE 1 SPRAY IN EACH NOSTRIL DAILY 16 g 0  . hydrocortisone 1 % ointment Apply 1 application topically 2 (two) times daily. To affected area 30 g 2  . hydrOXYzine (ATARAX/VISTARIL) 10 MG tablet Take 1 tablet (10 mg total) by mouth 3 (three) times daily as needed. 30 tablet 0  . ondansetron (ZOFRAN ODT) 8 MG disintegrating tablet Take 1 tablet (8 mg total) by mouth every 8 (eight) hours as needed for nausea. 30 tablet 11  . ritonavir (NORVIR) 100 MG TABS tablet TAKE 1 TABLET BY MOUTH EVERY MORNING WITH BREAKFAST. TAKE WITH REYATAZ 90 tablet 1  . traMADol (ULTRAM) 50 MG tablet TAKE 2 TABLETS(100 MG) BY MOUTH EVERY 6 HOURS AS NEEDED FOR MODERATE PAIN 90 tablet 0   No current facility-administered medications for this visit.     PHYSICAL EXAMINATION: ECOG PERFORMANCE STATUS: 1 - Symptomatic but completely ambulatory  Vitals:   01/24/19 1008  BP: 127/69  Pulse: 78  Resp: 18  Temp: 98.2 F (36.8 C)   Filed Weights   01/24/19 1008  Weight: 113 lb (51.3 kg)    GENERAL:alert, no distress and comfortable SKIN: Noted minor hives EYES: normal, Conjunctiva are pink and non-injected, sclera clear OROPHARYNX:no exudate, no erythema and lips, buccal mucosa, and tongue normal  NECK: supple, thyroid normal size, non-tender, without nodularity LYMPH:  no palpable lymphadenopathy in the cervical, axillary or inguinal LUNGS: clear to  auscultation and percussion with normal breathing effort HEART: regular rate & rhythm and no murmurs and no lower extremity edema ABDOMEN:abdomen soft, non-tender and normal bowel sounds Musculoskeletal:no cyanosis of digits and no clubbing  NEURO: alert & oriented x 3 with fluent speech, no focal motor/sensory deficits  LABORATORY DATA:  I have reviewed the data as listed    Component Value Date/Time   NA 141 12/27/2018 0759   NA 140 04/29/2017 0745   K 3.7 12/27/2018 0759   K 3.3 (L) 04/29/2017 0745   CL 109 12/27/2018 0759   CO2 23 12/27/2018 0759   CO2 23 04/29/2017 0745   GLUCOSE 87 12/27/2018 0759   GLUCOSE 83 04/29/2017 0745   BUN 10 12/27/2018 0759   BUN 10.7 04/29/2017 0745   CREATININE 1.09 (H) 12/27/2018 0759   CREATININE 1.2 (H) 04/29/2017 0745   CALCIUM 9.7 12/27/2018 0759   CALCIUM 9.8 04/29/2017 0745   PROT 7.2 12/27/2018 0759   PROT 7.6 04/29/2017 0745   ALBUMIN 4.2 12/27/2018 0759   ALBUMIN 4.0 04/29/2017 0745   AST 17 12/27/2018 0759   AST 19 04/29/2017 0745   ALT 17 12/27/2018 0759   ALT 19 04/29/2017 0745   ALKPHOS 101 12/27/2018 0759   ALKPHOS 81 04/29/2017 0745   BILITOT 0.9 12/27/2018 0759   BILITOT 1.31 (H) 04/29/2017 0745   GFRNONAA >60 12/27/2018 0759   GFRNONAA 83 02/20/2016 0908   GFRAA >60 12/27/2018 0759  GFRAA >89 02/20/2016 0908    No results found for: SPEP, UPEP  Lab Results  Component Value Date   WBC 7.6 01/24/2019   NEUTROABS 4.4 01/24/2019   HGB 13.5 01/24/2019   HCT 38.9 01/24/2019   MCV 98.2 01/24/2019   PLT 254 01/24/2019      Chemistry      Component Value Date/Time   NA 141 12/27/2018 0759   NA 140 04/29/2017 0745   K 3.7 12/27/2018 0759   K 3.3 (L) 04/29/2017 0745   CL 109 12/27/2018 0759   CO2 23 12/27/2018 0759   CO2 23 04/29/2017 0745   BUN 10 12/27/2018 0759   BUN 10.7 04/29/2017 0745   CREATININE 1.09 (H) 12/27/2018 0759   CREATININE 1.2 (H) 04/29/2017 0745      Component Value Date/Time   CALCIUM  9.7 12/27/2018 0759   CALCIUM 9.8 04/29/2017 0745   ALKPHOS 101 12/27/2018 0759   ALKPHOS 81 04/29/2017 0745   AST 17 12/27/2018 0759   AST 19 04/29/2017 0745   ALT 17 12/27/2018 0759   ALT 19 04/29/2017 0745   BILITOT 0.9 12/27/2018 0759   BILITOT 1.31 (H) 04/29/2017 0745       All questions were answered. The patient knows to call the clinic with any problems, questions or concerns. No barriers to learning was detected.  I spent 15 minutes counseling the patient face to face. The total time spent in the appointment was 20 minutes and more than 50% was on counseling and review of test results  Heath Lark, MD 01/24/2019 10:17 AM

## 2019-01-24 NOTE — Patient Instructions (Addendum)
[facility] Discharge Instructions for Patients Receiving Chemotherapy  Today you received the following chemotherapy agents Keytruda  To help prevent nausea and vomiting after your treatment, we encourage you to take your nausea medication as directed   If you develop nausea and vomiting that is not controlled by your nausea medication, call the clinic.   BELOW ARE SYMPTOMS THAT SHOULD BE REPORTED IMMEDIATELY:  *FEVER GREATER THAN 100.5 F  *CHILLS WITH OR WITHOUT FEVER  NAUSEA AND VOMITING THAT IS NOT CONTROLLED WITH YOUR NAUSEA MEDICATION  *UNUSUAL SHORTNESS OF BREATH  *UNUSUAL BRUISING OR BLEEDING  TENDERNESS IN MOUTH AND THROAT WITH OR WITHOUT PRESENCE OF ULCERS  *URINARY PROBLEMS  *BOWEL PROBLEMS  UNUSUAL RASH Items with * indicate a potential emergency and should be followed up as soon as possible.  Feel free to call the clinic should you have any questions or concerns. The clinic phone number is [phone].  Please show the [facility] at check-in to the Emergency Department and triage nurse.  Goserelin injection(zoladex) What is this medicine? GOSERELIN (GOE se rel in) is similar to a hormone found in the body. It lowers the amount of sex hormones that the body makes. Men will have lower testosterone levels and women will have lower estrogen levels while taking this medicine. In men, this medicine is used to treat prostate cancer; the injection is either given once per month or once every 12 weeks. A once per month injection (only) is used to treat women with endometriosis, dysfunctional uterine bleeding, or advanced breast cancer. This medicine may be used for other purposes; ask your health care provider or pharmacist if you have questions. COMMON BRAND NAME(S): Zoladex What should I tell my health care provider before I take this medicine? They need to know if you have any of these conditions:  bone problems  diabetes  heart  disease  history of irregular heartbeat  an unusual or allergic reaction to goserelin, other medicines, foods, dyes, or preservatives  pregnant or trying to get pregnant  breast-feeding How should I use this medicine? This medicine is for injection under the skin. It is given by a health care professional in a hospital or clinic setting. Talk to your pediatrician regarding the use of this medicine in children. Special care may be needed. Overdosage: If you think you have taken too much of this medicine contact a poison control center or emergency room at once. NOTE: This medicine is only for you. Do not share this medicine with others. What if I miss a dose? It is important not to miss your dose. Call your doctor or health care professional if you are unable to keep an appointment. What may interact with this medicine? Do not take this medicine with any of the following medications:  cisapride  dronedarone  pimozide  thioridazine This medicine may also interact with the following medications:  other medicines that prolong the QT interval (an abnormal heart rhythm) This list may not describe all possible interactions. Give your health care provider a list of all the medicines, herbs, non-prescription drugs, or dietary supplements you use. Also tell them if you smoke, drink alcohol, or use illegal drugs. Some items may interact with your medicine. What should I watch for while using this medicine? Visit your doctor or health care provider for regular checks on your progress. Your symptoms may appear to get worse during the first weeks of this therapy. Tell your doctor or healthcare provider if your symptoms do  not start to get better or if they get worse after this time. Your bones may get weaker if you take this medicine for a long time. If you smoke or frequently drink alcohol you may increase your risk of bone loss. A family history of osteoporosis, chronic use of drugs for seizures  (convulsions), or corticosteroids can also increase your risk of bone loss. Talk to your doctor about how to keep your bones strong. This medicine should stop regular monthly menstruation in women. Tell your doctor if you continue to menstruate. Women should not become pregnant while taking this medicine or for 12 weeks after stopping this medicine. Women should inform their doctor if they wish to become pregnant or think they might be pregnant. There is a potential for serious side effects to an unborn child. Talk to your health care professional or pharmacist for more information. Do not breast-feed an infant while taking this medicine. Men should inform their doctors if they wish to father a child. This medicine may lower sperm counts. Talk to your health care professional or pharmacist for more information. This medicine may increase blood sugar. Ask your healthcare provider if changes in diet or medicines are needed if you have diabetes. What side effects may I notice from receiving this medicine? Side effects that you should report to your doctor or health care professional as soon as possible:  allergic reactions like skin rash, itching or hives, swelling of the face, lips, or tongue  bone pain  breathing problems  changes in vision  chest pain  feeling faint or lightheaded, falls  fever, chills  pain, swelling, warmth in the leg  pain, tingling, numbness in the hands or feet  signs and symptoms of high blood sugar such as being more thirsty or hungry or having to urinate more than normal. You may also feel very tired or have blurry vision  signs and symptoms of low blood pressure like dizziness; feeling faint or lightheaded, falls; unusually weak or tired  stomach pain  swelling of the ankles, feet, hands  trouble passing urine or change in the amount of urine  unusually high or low blood pressure  unusually weak or tired Side effects that usually do not require medical  attention (report to your doctor or health care professional if they continue or are bothersome):  change in sex drive or performance  changes in breast size in both males and females  changes in emotions or moods  headache  hot flashes  irritation at site where injected  loss of appetite  skin problems like acne, dry skin  vaginal dryness This list may not describe all possible side effects. Call your doctor for medical advice about side effects. You may report side effects to FDA at 1-800-FDA-1088. Where should I keep my medicine? This drug is given in a hospital or clinic and will not be stored at home. NOTE: This sheet is a summary. It may not cover all possible information. If you have questions about this medicine, talk to your doctor, pharmacist, or health care provider.  2020 Elsevier/Gold Standard (2018-08-01 14:05:56)

## 2019-01-25 ENCOUNTER — Telehealth: Payer: Self-pay | Admitting: Hematology and Oncology

## 2019-01-25 NOTE — Telephone Encounter (Signed)
I left a message regarding schedule  

## 2019-01-30 ENCOUNTER — Other Ambulatory Visit: Payer: Self-pay | Admitting: Hematology and Oncology

## 2019-01-30 ENCOUNTER — Telehealth: Payer: Self-pay

## 2019-01-30 DIAGNOSIS — B20 Human immunodeficiency virus [HIV] disease: Secondary | ICD-10-CM

## 2019-01-30 MED ORDER — TRAMADOL HCL 50 MG PO TABS: ORAL_TABLET | ORAL | 0 refills | Status: DC

## 2019-01-30 NOTE — Telephone Encounter (Signed)
done

## 2019-01-30 NOTE — Telephone Encounter (Signed)
She called requesting refill on Tramadol. Ask that Rx be sent to Pharmacy.

## 2019-01-30 NOTE — Telephone Encounter (Signed)
Called and given below message. She verbalized understanding. 

## 2019-02-08 ENCOUNTER — Telehealth: Payer: Self-pay

## 2019-02-08 ENCOUNTER — Other Ambulatory Visit: Payer: Self-pay | Admitting: Hematology and Oncology

## 2019-02-08 DIAGNOSIS — B20 Human immunodeficiency virus [HIV] disease: Secondary | ICD-10-CM

## 2019-02-08 MED ORDER — TRAMADOL HCL 50 MG PO TABS: ORAL_TABLET | ORAL | 0 refills | Status: DC

## 2019-02-08 NOTE — Telephone Encounter (Signed)
She called and left a message requesting refill on Tramadol Rx.  

## 2019-02-10 ENCOUNTER — Other Ambulatory Visit: Payer: Self-pay

## 2019-02-13 ENCOUNTER — Other Ambulatory Visit: Payer: Self-pay

## 2019-02-13 ENCOUNTER — Encounter: Payer: Self-pay | Admitting: Internal Medicine

## 2019-02-13 ENCOUNTER — Ambulatory Visit (INDEPENDENT_AMBULATORY_CARE_PROVIDER_SITE_OTHER): Payer: 59 | Admitting: Internal Medicine

## 2019-02-13 VITALS — BP 103/58 | HR 58 | Temp 98.1°F | Wt 114.6 lb

## 2019-02-13 DIAGNOSIS — Z23 Encounter for immunization: Secondary | ICD-10-CM

## 2019-02-13 DIAGNOSIS — Z113 Encounter for screening for infections with a predominantly sexual mode of transmission: Secondary | ICD-10-CM | POA: Diagnosis not present

## 2019-02-13 DIAGNOSIS — B20 Human immunodeficiency virus [HIV] disease: Secondary | ICD-10-CM | POA: Diagnosis not present

## 2019-02-13 NOTE — Assessment & Plan Note (Signed)
Will screen today 

## 2019-02-13 NOTE — Addendum Note (Signed)
Addended by: Dolan Amen D on: 02/13/2019 10:16 AM   Modules accepted: Orders

## 2019-02-13 NOTE — Progress Notes (Signed)
   Subjective:    Patient ID: [name], female    DOB: [birth year], 33 y.o.   MRN: [id number]  HPI Here for follow up of HIV She continues on Reyataz, norvir and Truvada and no missed doses.  No new issues.  No associated diarrhea.  No complaints today.  Continues to follow with oncology and issues with hives.     Review of Systems  Constitutional: Negative for fatigue and fever.  Gastrointestinal: Negative for diarrhea.       Objective:   Physical Exam  Constitutional: She appears well-developed and well-nourished. No distress.  Eyes: No scleral icterus.  Cardiovascular: Normal rate, regular rhythm and normal heart sounds.  No murmur heard. Pulmonary/Chest: Effort normal and breath sounds normal. No respiratory distress.  Neurological: She is alert.  Skin: No rash noted.  Psychiatric: She has a normal mood and affect.   SH: still occasional tobacco       Assessment & Plan:

## 2019-02-13 NOTE — Assessment & Plan Note (Signed)
Doing well with her regimen and no changes.  Labs today and rtc 4 months.

## 2019-02-13 NOTE — Assessment & Plan Note (Signed)
She was counseled on the need for the flu shot and refused.

## 2019-02-14 LAB — T-HELPER CELL (CD4) - (RCID CLINIC ONLY)
CD4 % Helper T Cell: 39 % (ref 33–65)
CD4 T Cell Abs: 793 /uL (ref 400–1790)

## 2019-02-16 LAB — RPR: RPR Ser Ql: NONREACTIVE

## 2019-02-16 LAB — HIV-1 RNA QUANT-NO REFLEX-BLD
HIV 1 RNA Quant: <20 copies/mL — AB
HIV-1 RNA Quant, Log: <1.3 Log copies/mL — AB

## 2019-02-20 ENCOUNTER — Other Ambulatory Visit: Payer: Self-pay | Admitting: Hematology and Oncology

## 2019-02-20 ENCOUNTER — Telehealth: Payer: Self-pay | Admitting: *Deleted

## 2019-02-20 DIAGNOSIS — B20 Human immunodeficiency virus [HIV] disease: Secondary | ICD-10-CM

## 2019-02-20 MED ORDER — TRAMADOL HCL 50 MG PO TABS: ORAL_TABLET | ORAL | 0 refills | Status: DC

## 2019-02-20 NOTE — Telephone Encounter (Signed)
Patient calling for a Tramadol refill please.

## 2019-02-28 ENCOUNTER — Telehealth: Payer: Self-pay

## 2019-02-28 ENCOUNTER — Other Ambulatory Visit: Payer: Self-pay | Admitting: Hematology and Oncology

## 2019-02-28 DIAGNOSIS — B20 Human immunodeficiency virus [HIV] disease: Secondary | ICD-10-CM

## 2019-02-28 NOTE — Telephone Encounter (Signed)
Pls count for me; I dont think she is due for refill until Friday

## 2019-02-28 NOTE — Telephone Encounter (Signed)
She called and requested refill on Tramadol Rx. 

## 2019-02-28 NOTE — Telephone Encounter (Signed)
Called and left a message that it is too early to fill Tramadol. Instructed to call back Thursday.

## 2019-03-01 ENCOUNTER — Telehealth: Payer: Self-pay

## 2019-03-01 NOTE — Telephone Encounter (Signed)
She called and left message that she needs Tramadol refill.  Called back and told that it is too early for refill. Instructed to call back tomorrow for refill. She verbalized understanding.

## 2019-03-02 ENCOUNTER — Telehealth: Payer: Self-pay

## 2019-03-02 NOTE — Telephone Encounter (Signed)
With her last refill, she is only able to get refill tomorrow I will refill tomorrow morning

## 2019-03-02 NOTE — Telephone Encounter (Signed)
She called and left a message. Asking for refill on Tramadol.

## 2019-03-02 NOTE — Telephone Encounter (Signed)
Called and given below message. She verbalized understanding. 

## 2019-03-03 ENCOUNTER — Telehealth: Payer: Self-pay | Admitting: *Deleted

## 2019-03-03 ENCOUNTER — Other Ambulatory Visit: Payer: Self-pay | Admitting: Hematology and Oncology

## 2019-03-03 DIAGNOSIS — B20 Human immunodeficiency virus [HIV] disease: Secondary | ICD-10-CM

## 2019-03-03 MED ORDER — TRAMADOL HCL 50 MG PO TABS: ORAL_TABLET | ORAL | 0 refills | Status: DC

## 2019-03-03 NOTE — Telephone Encounter (Signed)
Done this morning 

## 2019-03-03 NOTE — Telephone Encounter (Signed)
Patient calling for a Tramadol refill please.

## 2019-03-08 ENCOUNTER — Other Ambulatory Visit: Payer: Self-pay | Admitting: Hematology and Oncology

## 2019-03-13 ENCOUNTER — Telehealth: Payer: Self-pay

## 2019-03-13 NOTE — Telephone Encounter (Signed)
It's not due till tomorrow

## 2019-03-13 NOTE — Telephone Encounter (Signed)
She called and requested and refill on Tramadol Rx.

## 2019-03-13 NOTE — Telephone Encounter (Signed)
Called and given below message. She verbalized understanding. 

## 2019-03-14 ENCOUNTER — Inpatient Hospital Stay: Payer: 59 | Attending: Hematology and Oncology

## 2019-03-14 ENCOUNTER — Other Ambulatory Visit: Payer: Self-pay | Admitting: Hematology and Oncology

## 2019-03-14 ENCOUNTER — Inpatient Hospital Stay: Payer: 59

## 2019-03-14 ENCOUNTER — Other Ambulatory Visit: Payer: Self-pay

## 2019-03-14 ENCOUNTER — Inpatient Hospital Stay (HOSPITAL_BASED_OUTPATIENT_CLINIC_OR_DEPARTMENT_OTHER): Payer: 59 | Admitting: Hematology and Oncology

## 2019-03-14 DIAGNOSIS — R630 Anorexia: Secondary | ICD-10-CM | POA: Diagnosis not present

## 2019-03-14 DIAGNOSIS — B20 Human immunodeficiency virus [HIV] disease: Secondary | ICD-10-CM | POA: Diagnosis not present

## 2019-03-14 DIAGNOSIS — Z5112 Encounter for antineoplastic immunotherapy: Secondary | ICD-10-CM | POA: Diagnosis present

## 2019-03-14 DIAGNOSIS — Z95828 Presence of other vascular implants and grafts: Secondary | ICD-10-CM

## 2019-03-14 DIAGNOSIS — C8118 Nodular sclerosis classical Hodgkin lymphoma, lymph nodes of multiple sites: Secondary | ICD-10-CM

## 2019-03-14 DIAGNOSIS — G8929 Other chronic pain: Secondary | ICD-10-CM

## 2019-03-14 DIAGNOSIS — Z79899 Other long term (current) drug therapy: Secondary | ICD-10-CM | POA: Insufficient documentation

## 2019-03-14 DIAGNOSIS — C8198 Hodgkin lymphoma, unspecified, lymph nodes of multiple sites: Secondary | ICD-10-CM

## 2019-03-14 DIAGNOSIS — M2550 Pain in unspecified joint: Secondary | ICD-10-CM | POA: Diagnosis not present

## 2019-03-14 DIAGNOSIS — Z5111 Encounter for antineoplastic chemotherapy: Secondary | ICD-10-CM

## 2019-03-14 LAB — COMPREHENSIVE METABOLIC PANEL
ALT: 15 U/L (ref 0–44)
AST: 16 U/L (ref 15–41)
Albumin: 4.1 g/dL (ref 3.5–5.0)
Alkaline Phosphatase: 97 U/L (ref 38–126)
Anion gap: 11 (ref 5–15)
BUN: 11 mg/dL (ref 6–20)
CO2: 23 mmol/L (ref 22–32)
Calcium: 9.3 mg/dL (ref 8.9–10.3)
Chloride: 106 mmol/L (ref 98–111)
Creatinine, Ser: 1.07 mg/dL — ABNORMAL HIGH (ref 0.44–1.00)
GFR calc Af Amer: >60 mL/min (ref >60)
GFR calc non Af Amer: >60 mL/min (ref >60)
Glucose, Bld: 81 mg/dL (ref 70–99)
Potassium: 4.1 mmol/L (ref 3.5–5.1)
Sodium: 140 mmol/L (ref 135–145)
Total Bilirubin: 1.3 mg/dL — ABNORMAL HIGH (ref 0.3–1.2)
Total Protein: 6.8 g/dL (ref 6.5–8.1)

## 2019-03-14 LAB — CBC WITH DIFFERENTIAL/PLATELET
Abs Immature Granulocytes: 0.02 10*3/uL (ref 0.00–0.07)
Basophils Absolute: 0 10*3/uL (ref 0.0–0.1)
Basophils Relative: 0 %
Eosinophils Absolute: 0.1 10*3/uL (ref 0.0–0.5)
Eosinophils Relative: 1 %
HCT: 36.6 % (ref 36.0–46.0)
Hemoglobin: 12.9 g/dL (ref 12.0–15.0)
Immature Granulocytes: 0 %
Lymphocytes Relative: 30 %
Lymphs Abs: 2.2 10*3/uL (ref 0.7–4.0)
MCH: 34.6 pg — ABNORMAL HIGH (ref 26.0–34.0)
MCHC: 35.2 g/dL (ref 30.0–36.0)
MCV: 98.1 fL (ref 80.0–100.0)
Monocytes Absolute: 0.5 10*3/uL (ref 0.1–1.0)
Monocytes Relative: 7 %
Neutro Abs: 4.5 10*3/uL (ref 1.7–7.7)
Neutrophils Relative %: 62 %
Platelets: 250 10*3/uL (ref 150–400)
RBC: 3.73 MIL/uL — ABNORMAL LOW (ref 3.87–5.11)
RDW: 12.3 % (ref 11.5–15.5)
WBC: 7.3 10*3/uL (ref 4.0–10.5)
nRBC: 0 % (ref 0.0–0.2)

## 2019-03-14 LAB — TSH: TSH: 1.558 u[IU]/mL (ref 0.308–3.960)

## 2019-03-14 MED ORDER — SODIUM CHLORIDE 0.9 % IV SOLN
Freq: Once | INTRAVENOUS | Status: AC
  Administered 2019-03-14: 10:00:00 via INTRAVENOUS
  Filled 2019-03-14: qty 250

## 2019-03-14 MED ORDER — TRAMADOL HCL 50 MG PO TABS: ORAL_TABLET | ORAL | 0 refills | Status: DC

## 2019-03-14 MED ORDER — SODIUM CHLORIDE 0.9% FLUSH
10.0000 mL | INTRAVENOUS | Status: DC | PRN
  Administered 2019-03-14: 10 mL via INTRAVENOUS
  Filled 2019-03-14: qty 10

## 2019-03-14 MED ORDER — HEPARIN SOD (PORK) LOCK FLUSH 100 UNIT/ML IV SOLN
500.0000 [IU] | Freq: Once | INTRAVENOUS | Status: AC | PRN
  Administered 2019-03-14: 12:00:00 500 [IU]
  Filled 2019-03-14: qty 5

## 2019-03-14 MED ORDER — SODIUM CHLORIDE 0.9% FLUSH
10.0000 mL | INTRAVENOUS | Status: DC | PRN
  Administered 2019-03-14: 12:00:00 10 mL
  Filled 2019-03-14: qty 10

## 2019-03-14 MED ORDER — SODIUM CHLORIDE 0.9 % IV SOLN
200.0000 mg | Freq: Once | INTRAVENOUS | Status: AC
  Administered 2019-03-14: 12:00:00 200 mg via INTRAVENOUS
  Filled 2019-03-14: qty 8

## 2019-03-14 NOTE — Patient Instructions (Signed)
[facility] Discharge Instructions for Patients Receiving Chemotherapy  Today you received the following chemotherapy agents Pembrolizumab (KEYTRUDA).  To help prevent nausea and vomiting after your treatment, we encourage you to take your nausea medication as prescribed.   If you develop nausea and vomiting that is not controlled by your nausea medication, call the clinic.   BELOW ARE SYMPTOMS THAT SHOULD BE REPORTED IMMEDIATELY:  *FEVER GREATER THAN 100.5 F  *CHILLS WITH OR WITHOUT FEVER  NAUSEA AND VOMITING THAT IS NOT CONTROLLED WITH YOUR NAUSEA MEDICATION  *UNUSUAL SHORTNESS OF BREATH  *UNUSUAL BRUISING OR BLEEDING  TENDERNESS IN MOUTH AND THROAT WITH OR WITHOUT PRESENCE OF ULCERS  *URINARY PROBLEMS  *BOWEL PROBLEMS  UNUSUAL RASH Items with * indicate a potential emergency and should be followed up as soon as possible.  Feel free to call the clinic should you have any questions or concerns. The clinic phone number is [phone].  Please show the CHEMO ALERT CARD at check-in to the Emergency Department and triage nurse.  Coronavirus (COVID-19) Are you at risk?  Are you at risk for the Coronavirus (COVID-19)?  To be considered HIGH RISK for Coronavirus (COVID-19), you have to meet the following criteria:  . Traveled to [place], [place], [place], [place] or [place]; or in the [place] to [place], [place], [place], or [place]; and have fever, cough, and shortness of breath within the last 2 weeks of travel OR . Been in close contact with a person diagnosed with COVID-19 within the last 2 weeks and have fever, cough, and shortness of breath . IF YOU DO NOT MEET THESE CRITERIA, YOU ARE CONSIDERED LOW RISK FOR COVID-19.  What to do if you are HIGH RISK for COVID-19?  . If you are having a medical emergency, call 911. . Seek medical care right away. Before you go to a doctor's office, urgent care or emergency department, call ahead and tell  them about your recent travel, contact with someone diagnosed with COVID-19, and your symptoms. You should receive instructions from your physician's office regarding next steps of care.  . When you arrive at healthcare provider, tell the healthcare staff immediately you have returned from visiting [place], [place], [place], [place] or [place]; or traveled in the [place] to [place], [place], [place], or [place]; in the last two weeks or you have been in close contact with a person diagnosed with COVID-19 in the last 2 weeks.   . Tell the health care staff about your symptoms: fever, cough and shortness of breath. . After you have been seen by a medical provider, you will be either: o Tested for (COVID-19) and discharged home on quarantine except to seek medical care if symptoms worsen, and asked to  - Stay home and avoid contact with others until you get your results (4-5 days)  - Avoid travel on public transportation if possible (such as bus, train, or airplane) or o Sent to the Emergency Department by EMS for evaluation, COVID-19 testing, and possible admission depending on your condition and test results.  What to do if you are LOW RISK for COVID-19?  Reduce your risk of any infection by using the same precautions used for avoiding the common cold or flu:  . Wash your hands often with soap and warm water for at least 20 seconds.  If soap and water are not readily available, use an alcohol-based hand sanitizer with at least 60% alcohol.  . If coughing or   sneezing, cover your mouth and nose by coughing or sneezing into the elbow areas of your shirt or coat, into a tissue or into your sleeve (not your hands). . Avoid shaking hands with others and consider head nods or verbal greetings only. . Avoid touching your eyes, nose, or mouth with unwashed hands.  . Avoid close contact with people who are sick. . Avoid places or events with large numbers of people in one location, like concerts or  sporting events. . Carefully consider travel plans you have or are making. . If you are planning any travel outside or inside the [place], visit the CDC's Travelers' Health webpage for the latest health notices. . If you have some symptoms but not all symptoms, continue to monitor at home and seek medical attention if your symptoms worsen. . If you are having a medical emergency, call 911.   ADDITIONAL HEALTHCARE OPTIONS FOR PATIENTS  [facility] / e-Visit: [email or web address]         [facility]: [phone]  [facility]: [phone]                   [facility]: [phone]    

## 2019-03-15 ENCOUNTER — Telehealth: Payer: Self-pay | Admitting: Hematology and Oncology

## 2019-03-15 ENCOUNTER — Encounter: Payer: Self-pay | Admitting: Hematology and Oncology

## 2019-03-15 NOTE — Assessment & Plan Note (Signed)
Since I saw the patient, she tolerated treatment better when we space it out to 6 weeks I will see her again after Christmas The patient wants to stop Lupron/Zoladex injection because it caused severe pain I do not plan to repeat CT imaging until next year Her recent CT scan was negative for recurrence

## 2019-03-15 NOTE — Telephone Encounter (Signed)
Scheduled appt per 11/18 sch message - pt is aware appt was scheduled. Unable to talk when I called , because she is at work but she let me call back to leave date and time on vmail and she will call back if theres any issues.

## 2019-03-15 NOTE — Assessment & Plan Note (Signed)
Her anorexia has improved with recent introduction of Megace We will continue the same

## 2019-03-15 NOTE — Progress Notes (Signed)
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  Anorexia Her anorexia has improved with recent introduction of Megace We will continue the same   No orders of the defined types were placed in this encounter.   INTERVAL HISTORY: Please see below for problem oriented charting. 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   05/07/2014 Imaging   CT scan of the chest show right thoracic inlet lymphadenopathy   06/07/2014 Procedure   She underwent ultrasound-guided core biopsy of the neck lymph node   06/07/2014 Pathology Results    Accession: [id number] biopsy confirmed diagnosis of Hodgkin lymphoma.   06/15/2014 Imaging   Echocardiogram showed preserved ejection fraction   07/09/2014 - 07/12/2014 Hospital Admission   She was admitted to the hospital for severe anemia.   07/27/2014 Procedure   She had placement of port   07/31/2014 - 09/11/2014 Chemotherapy   She received dose adjusted chemotherapy due to abnormal liver function tests and severe anemia. Treatment was delayed due to noncompliance  and subsequently stopped because the patient failed to keep appointments   01/11/2015 Imaging   Repeat PET CT scan showed response to treatment   01/28/2015 - 06/18/2015 Chemotherapy   ABVD was restarted with full dose.   02/08/2015 - 02/10/2015 Hospital Admission   The patient was admitted to the hospital due to pancytopenia and profuse diarrhea. Cultures were negative. She was placed on ciprofloxacin.   02/11/2015 Adverse Reaction   Treatment was placed on hold due to recent infection.   04/11/2015 Imaging   PET CT scan showed near complete response. Incidental finding of an abnormal bone lesion, indeterminate. She is not symptomatic. Recommendation from [facility] to observe   07/11/2015 Imaging   PET CT scan showed abnormal new bone lesions, suggestive of possible disease progression   07/23/2015 Bone Marrow Biopsy   She underwent bone biopsy   07/23/2015 Pathology Results   Accession: [id number]  biopsy was negative for cancer   11/21/2015 Surgery   She had surgery for ectopic pregnancy   01/29/2016 Imaging   Ct chest, abdomen and pelvis showed pelvic and retroperitoneal lymphadenopathy, as above, concerning for residual disease. There is also a mildly enlarged posterior mediastinal lymph node measuring 11  mm adjacent to the distal descending thoracic aorta. This may represent an additional focus of disease, but is the only finding of concern in the thorax on today's examination. Sclerosis in the right ilium at site of  previously noted metabolically active lesion, grossly unchanged. No other definite osseous lesions are identified on today's examination. Spleen is normal in size and appearance.   02/14/2016 PET scan   Interval disease worsening with new foci of hypermetabolic activity in multiple retroperitoneal and pelvic lymph nodes as well as AP window and left hilar lymph nodes. (Deauville 5). There is also overall worsening of the osseous disease.   05/11/2016 Pathology Results   Diagnosis Lymph node, needle/core biopsy, Left para-aortic retroperitoneal - CLASSICAL HODGKIN LYMPHOMA. - SEE ONCOLOGY TABLE. Microscopic Comment LYMPHOMA Histologic type: Classical Hodgkin lymphoma. Grade (if applicable): N/A Flow cytometry: Not done. Immunohistochemical stains: CD15, CD20, CD3, LCA, PAX-5, CD30 with appropriate controls. Touch preps/imprints: Not performed. Comments: The sections show small needle core biopsy fragments displaying a polymorphous cellular proliferation of small lymphocytes, plasma cells, eosinophils, and large atypical mononuclear and multilobated lymphoid cells with features of Reed-Sternberg cells and variants. This is associated with patchy areas of fibrosis. Immunohistochemical stains were performed and show that the large atypical lymphoid cells are positive for CD30, CD15 and PAX-5 and negative for LCA, CD20, CD3. The small lymphoid cells in the background show a mixture of T and B cells with predominance of T cells. The overall morphologic and histologic features are consistent with classical Hodgkin lymphoma. Further subtyping is challenging in limited small biopsy fragments but the patchy fibrosis suggests nodular sclerosis type.    05/11/2016 Procedure   She underwent CT guided biopsy of retroperitoneal lymph node   05/26/2016 Procedure   Successful placement of a right IJ approach Power Port with ultrasound and fluoroscopic guidance. The catheter is ready for use.   06/02/2016 PET  scan   Mixed response to chemotherapy with some lymph nodes decreased in metabolic activity and some lymph nodes increase metabolic activity. Lymph node stations including mediastinum, periaortic retroperitoneum, and obturator node stations. Activity is remains relatively intense Deauville 4 2. LEFT infrahilar nodule / lymph node with intense metabolic activity decreased from prior. ( Deauville 4 ). 3. 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 Deauville 5   06/03/2016 - 06/05/2016 Hospital Admission   She was admitted to the hospital for cycle 1 of ICE chemotherapy   06/24/2016 - 06/26/2016 Hospital Admission   She received cycle 2 of ICE chemo   07/07/2016 PET scan   Resolution of prior hypermetabolic adenopathy and resolution of prior osseous foci of hypermetabolic activity compatible with essentially complete response to therapy (Deauville 1). 2. Generalized reduced activity in the L4 vertebral body and the type of finding which would typically reflect prior radiation therapy. 3. Stable septated fatty right pelvic lesion, possibly a dermoid or lipoma, not hypermetabolic   10/31/8673 PET scan   Hypermetabolic lesion along the L3 vertebral body and left posterior elements, max SUV 7.8 (Deauville 5). Hypermetabolic lesion along the left inferior pubic ramus, max SUV 4.9 (Deauville 4).  IMPRESSION: Prevascular lymphadenopathy, reflecting nodal recurrence (Deauville 4). Hypermetabolic osseous metastases involving the L3 vertebral body/posterior elements and left inferior pubic ramus (Deauville 4-5). Hypermetabolism along the endometrium, new, possibly reactive/physiologic. Consider pelvic ultrasound and/or endometrial sampling as clinically warranted.   11/04/2016 - 02/24/2017 Chemotherapy   She received Brentuximab   12/11/2016 PET scan   1. Mixed response to therapy within the skeleton. Lesions  at L3 is decreased in size and metabolic activity. Residual activity remains  above liver ( Deauville 4) 2. Increased activity in the LEFT sacrum with metabolic activity above liver activity ( Deauville 4 3. Decrease in size and metabolic activity of anterior mediastinal tissue consistent with resolution of thymic hyperplasia or resolution of lymphoma metabolic activity ( Deauville 2). 4. No new lymphadenopathy. Normal spleen and liver.   03/25/2017 PET scan   1. Increased metabolic activity in several normal sized retroperitoneal and right pelvic lymph nodes, Deauville 4. 2. Increase in size and metabolic activity of lesions in the L3 and L4 vertebral bodies and in the left sacrum, Deauville 5. 3. No recurrence of anterior mediastinal abnormal activity. 4. Other imaging findings of potential clinical significance: Chronic ethmoid and left maxillary sinusitis. Paraseptal emphysema at the lung apices. Right ovarian dermoid.   04/06/2017 -  Chemotherapy   The patient had Keytruda   04/06/2017 -  Chemotherapy   The patient had pembrolizumab (KEYTRUDA) 200 mg in sodium chloride 0.9 % 50 mL chemo infusion, 200 mg, Intravenous, Once, 29 of 30 cycles Administration: 200 mg (04/06/2017), 200 mg (04/29/2017), 200 mg (05/20/2017), 200 mg (06/10/2017), 200 mg (07/01/2017), 200 mg (07/22/2017), 200 mg (08/12/2017), 200 mg (09/02/2017), 200 mg (10/04/2017), 200 mg (11/03/2017), 200 mg (12/02/2017), 200 mg (12/30/2017), 200 mg (01/27/2018), 200 mg (02/24/2018), 200 mg (03/22/2018), 200 mg (04/19/2018), 200 mg (05/17/2018), 200 mg (06/14/2018), 200 mg (07/12/2018), 200 mg (08/09/2018), 200 mg (09/06/2018), 200 mg (10/04/2018), 200 mg (11/01/2018), 200 mg (11/29/2018), 200 mg (12/27/2018), 200 mg (01/24/2019), 200 mg (03/14/2019)  for chemotherapy treatment.    06/09/2017 PET scan   1. Overall significant improvement with reduction in nodal activity. Several retroperitoneal nodes which were previously Deauville 4 are currently Deauville 3. The right common iliac lymph node remains at Deauville 4 with maximum SUV of  3.4, but is improved from prior SUV of 4.3. 2. The bony lesions show the greatest improvement, with the previously Deauville 5 hypermetabolic lesions currently no longer of higher metabolic activity than surrounding bone marrow, currently measuring at Deauville 3. 3. 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 4. No new lesions are identified. 5. Emphysema (ICD10-J43.9).   08/05/2017 Imaging   1. Somewhat complex left adnexal lesion, potentially hemorrhagic cyst measuring 2.9 x 2.8 cm. The possibility of torsion in the left ovary must be considered. Advise correlation with pelvic ultrasound including Doppler assessment to further evaluate.  2.  Right ovarian dermoid, unchanged from recent PET-CT examination.  3. No adenopathy by size criteria evident. Appearance of subcentimeter mesenteric lymph nodes is stable compared to recent study.  4. No bone lesions appreciable by CT. Areas of abnormal radiotracer uptake in the mid lumbar spine noted on recent PET-CT examination. No destruction 2 or lytic change noted in the lumbar vertebrae currently there.  5. No bowel obstruction. No abscess. No periappendiceal region inflammation.  6.  Small hiatal hernia with fluid in distal esophagus.  7.  Foci of coronary artery calcification, advanced for age.  8.  Spleen normal in size and contour.  9. Small hiatal hernia with mild fluid in the distal esophagus, likely indicative of a degree of reflux.   01/04/2018 Imaging   1. No findings to suggest residual/recurrent lymphoma in the chest, abdomen or pelvis. 2. Right ovarian dermoid slightly larger than prior examinations, currently measuring 3.8 x 2.8 x 3.1 cm. 3. Additional incidental findings, as above.   07/11/2018 Imaging   No findings suspicious  for recurrent lymphoma. Spleen is normal in size.  Stable right ovarian dermoid.  4 mm subpleural nodular opacity in the posterior right lower lobe,  favoring subpleural atelectasis.     REVIEW OF SYSTEMS:   Constitutional: Denies fevers, chills or abnormal weight loss Eyes: Denies blurriness of vision Ears, nose, mouth, throat, and face: Denies mucositis or sore throat Respiratory: Denies cough, dyspnea or wheezes Cardiovascular: Denies palpitation, chest discomfort or lower extremity swelling Gastrointestinal:  Denies nausea, heartburn or change in bowel habits Skin: Denies abnormal skin rashes Lymphatics: Denies new lymphadenopathy or easy bruising Neurological:Denies numbness, tingling or new weaknesses Behavioral/Psych: Mood is stable, no new changes  All other systems were reviewed with the patient and are negative.  I have reviewed the past medical history, past surgical history, social history and family history with the patient and they are unchanged from previous note.  ALLERGIES:  is allergic to temazepam.  MEDICATIONS:  Current Outpatient Medications  Medication Sig Dispense Refill  . atazanavir (REYATAZ) 300 MG capsule TAKE ONE CAPSULE BY MOUTH DAILY WITH BREAKFAST. TAKE WITH NORVIR 90 capsule 1  . cholecalciferol (VITAMIN D) 1000 units tablet Take 1 tablet (1,000 Units total) by mouth daily. 30 tablet 9  . Clotrimazole 1 % OINT Apply 1 application topically 2 (two) times daily. 56.7 g 1  . emtricitabine-tenofovir (TRUVADA) 200-300 MG tablet Take 1 tablet by mouth daily. 90 tablet 1  . fluticasone (FLONASE) 50 MCG/ACT nasal spray SHAKE LIQUID AND USE 1 SPRAY IN EACH NOSTRIL DAILY 16 g 0  . hydrocortisone 1 % ointment Apply 1 application topically 2 (two) times daily. To affected area 30 g 2  . hydrOXYzine (ATARAX/VISTARIL) 10 MG tablet Take 1 tablet (10 mg total) by mouth 3 (three) times daily as needed. 30 tablet 0  . megestrol (MEGACE) 40 MG tablet     . ondansetron (ZOFRAN ODT) 8 MG disintegrating tablet Take 1 tablet (8 mg total) by mouth every 8 (eight) hours as needed for nausea. 30 tablet 11  . ritonavir  (NORVIR) 100 MG TABS tablet TAKE 1 TABLET BY MOUTH EVERY MORNING WITH BREAKFAST. TAKE WITH REYATAZ 90 tablet 1  . traMADol (ULTRAM) 50 MG tablet TAKE 2 TABLETS(100 MG) BY MOUTH EVERY 6 HOURS AS NEEDED FOR MODERATE PAIN 90 tablet 0   No current facility-administered medications for this visit.     PHYSICAL EXAMINATION: ECOG PERFORMANCE STATUS: 1 - Symptomatic but completely ambulatory  Vitals:   03/14/19 0931  BP: 110/76  Pulse: 65  Resp: 18  Temp: 97.8 F (36.6 C)  SpO2: 100%   Filed Weights   03/14/19 0931  Weight: 117 lb 3.2 oz (53.2 kg)    GENERAL:alert, no distress and comfortable SKIN: skin color, texture, turgor are normal, no rashes or significant lesions EYES: normal, Conjunctiva are pink and non-injected, sclera clear OROPHARYNX:no exudate, no erythema and lips, buccal mucosa, and tongue normal  NECK: supple, thyroid normal size, non-tender, without nodularity LYMPH:  no palpable lymphadenopathy in the cervical, axillary or inguinal LUNGS: clear to auscultation and percussion with normal breathing effort HEART: regular rate & rhythm and no murmurs and no lower extremity edema ABDOMEN:abdomen soft, non-tender and normal bowel sounds Musculoskeletal:no cyanosis of digits and no clubbing  NEURO: alert & oriented x 3 with fluent speech, no focal motor/sensory deficits  LABORATORY DATA:  I have reviewed the data as listed    Component Value Date/Time   NA 140 03/14/2019 0922   NA 140 04/29/2017 0745   K  4.1 03/14/2019 0922   K 3.3 (L) 04/29/2017 0745   CL 106 03/14/2019 0922   CO2 23 03/14/2019 0922   CO2 23 04/29/2017 0745   GLUCOSE 81 03/14/2019 0922   GLUCOSE 83 04/29/2017 0745   BUN 11 03/14/2019 0922   BUN 10.7 04/29/2017 0745   CREATININE 1.07 (H) 03/14/2019 0922   CREATININE 1.2 (H) 04/29/2017 0745   CALCIUM 9.3 03/14/2019 0922   CALCIUM 9.8 04/29/2017 0745   PROT 6.8 03/14/2019 0922   PROT 7.6 04/29/2017 0745   ALBUMIN 4.1 03/14/2019 0922   ALBUMIN  4.0 04/29/2017 0745   AST 16 03/14/2019 0922   AST 19 04/29/2017 0745   ALT 15 03/14/2019 0922   ALT 19 04/29/2017 0745   ALKPHOS 97 03/14/2019 0922   ALKPHOS 81 04/29/2017 0745   BILITOT 1.3 (H) 03/14/2019 0922   BILITOT 1.31 (H) 04/29/2017 0745   GFRNONAA >60 03/14/2019 0922   GFRNONAA 83 02/20/2016 0908   GFRAA >60 03/14/2019 0922   GFRAA >89 02/20/2016 0908    No results found for: SPEP, UPEP  Lab Results  Component Value Date   WBC 7.3 03/14/2019   NEUTROABS 4.5 03/14/2019   HGB 12.9 03/14/2019   HCT 36.6 03/14/2019   MCV 98.1 03/14/2019   PLT 250 03/14/2019      Chemistry      Component Value Date/Time   NA 140 03/14/2019 0922   NA 140 04/29/2017 0745   K 4.1 03/14/2019 0922   K 3.3 (L) 04/29/2017 0745   CL 106 03/14/2019 0922   CO2 23 03/14/2019 0922   CO2 23 04/29/2017 0745   BUN 11 03/14/2019 0922   BUN 10.7 04/29/2017 0745   CREATININE 1.07 (H) 03/14/2019 0922   CREATININE 1.2 (H) 04/29/2017 0745      Component Value Date/Time   CALCIUM 9.3 03/14/2019 0922   CALCIUM 9.8 04/29/2017 0745   ALKPHOS 97 03/14/2019 0922   ALKPHOS 81 04/29/2017 0745   AST 16 03/14/2019 0922   AST 19 04/29/2017 0745   ALT 15 03/14/2019 0922   ALT 19 04/29/2017 0745   BILITOT 1.3 (H) 03/14/2019 0922   BILITOT 1.31 (H) 04/29/2017 0745       All questions were answered. The patient knows to call the clinic with any problems, questions or concerns. No barriers to learning was detected.  I spent 15 minutes counseling the patient face to face. The total time spent in the appointment was 20 minutes and more than 50% was on counseling and review of test results  Heath Lark, MD 03/15/2019 8:47 AM

## 2019-03-15 NOTE — Assessment & Plan Note (Signed)
She has multiple chronic joint pain and is dependent on tramadol to control her joint pain She is taking vitamin D supplement as prescribed We discussed refill policy. 

## 2019-03-17 ENCOUNTER — Other Ambulatory Visit: Payer: Self-pay | Admitting: Internal Medicine

## 2019-03-19 ENCOUNTER — Other Ambulatory Visit: Payer: Self-pay | Admitting: Internal Medicine

## 2019-03-19 DIAGNOSIS — B20 Human immunodeficiency virus [HIV] disease: Secondary | ICD-10-CM

## 2019-03-20 ENCOUNTER — Other Ambulatory Visit: Payer: Self-pay | Admitting: *Deleted

## 2019-03-20 DIAGNOSIS — B20 Human immunodeficiency virus [HIV] disease: Secondary | ICD-10-CM

## 2019-03-20 MED ORDER — EMTRICITABINE-TENOFOVIR DF 200-300 MG PO TABS: 1.0000 | ORAL_TABLET | Freq: Every day | ORAL | 1 refills | Status: DC

## 2019-03-20 MED ORDER — ATAZANAVIR SULFATE 300 MG PO CAPS: ORAL_CAPSULE | ORAL | 1 refills | Status: DC

## 2019-03-20 MED ORDER — RITONAVIR 100 MG PO TABS: ORAL_TABLET | ORAL | 1 refills | Status: DC

## 2019-03-27 ENCOUNTER — Telehealth: Payer: Self-pay

## 2019-03-27 ENCOUNTER — Other Ambulatory Visit: Payer: Self-pay | Admitting: Hematology and Oncology

## 2019-03-27 DIAGNOSIS — B20 Human immunodeficiency virus [HIV] disease: Secondary | ICD-10-CM

## 2019-03-27 MED ORDER — TRAMADOL HCL 50 MG PO TABS: ORAL_TABLET | ORAL | 0 refills | Status: DC

## 2019-03-27 NOTE — Telephone Encounter (Signed)
done

## 2019-03-27 NOTE — Telephone Encounter (Signed)
She called and left a message requesting a refill on Tramadol Rx.

## 2019-04-06 ENCOUNTER — Telehealth: Payer: Self-pay | Admitting: *Deleted

## 2019-04-06 ENCOUNTER — Other Ambulatory Visit: Payer: Self-pay | Admitting: Hematology and Oncology

## 2019-04-06 DIAGNOSIS — B20 Human immunodeficiency virus [HIV] disease: Secondary | ICD-10-CM

## 2019-04-06 MED ORDER — TRAMADOL HCL 50 MG PO TABS: ORAL_TABLET | ORAL | 0 refills | Status: DC

## 2019-04-06 NOTE — Telephone Encounter (Signed)
Patient notified

## 2019-04-06 NOTE — Telephone Encounter (Signed)
Patient calling for a refill of Tramadol please.

## 2019-04-06 NOTE — Telephone Encounter (Signed)
Done

## 2019-04-06 NOTE — Telephone Encounter (Signed)
Patient called to report her menstrual cycle started this morning. She was very surprised and confused. Advised her since she stopped the Zoladex injections her menstrual cycles will start again. She states she was unsure and scared something was wrong. Assured patient this was normal. She denies severe cramping or heavy flow. She understands to call this office with any further concerns or questions.

## 2019-04-17 ENCOUNTER — Other Ambulatory Visit: Payer: Self-pay | Admitting: Medical

## 2019-04-17 ENCOUNTER — Other Ambulatory Visit: Payer: Self-pay | Admitting: Hematology and Oncology

## 2019-04-17 DIAGNOSIS — B20 Human immunodeficiency virus [HIV] disease: Secondary | ICD-10-CM

## 2019-04-17 MED ORDER — TRAMADOL HCL 50 MG PO TABS: ORAL_TABLET | ORAL | 0 refills | Status: DC

## 2019-04-17 NOTE — Progress Notes (Signed)
Sent a prescription for tramadol at the request of Sandi Mealy

## 2019-04-25 ENCOUNTER — Telehealth: Payer: Self-pay | Admitting: Hematology and Oncology

## 2019-04-25 ENCOUNTER — Other Ambulatory Visit: Payer: 59

## 2019-04-25 ENCOUNTER — Ambulatory Visit: Payer: 59

## 2019-04-25 ENCOUNTER — Ambulatory Visit: Payer: 59 | Admitting: Hematology and Oncology

## 2019-04-25 NOTE — Telephone Encounter (Signed)
R/s appt per 12/29 sch message - unable to reach pt . Left message with appt date and time

## 2019-05-01 ENCOUNTER — Telehealth: Payer: Self-pay | Admitting: *Deleted

## 2019-05-01 ENCOUNTER — Other Ambulatory Visit: Payer: Self-pay | Admitting: Hematology and Oncology

## 2019-05-01 DIAGNOSIS — B20 Human immunodeficiency virus [HIV] disease: Secondary | ICD-10-CM

## 2019-05-01 MED ORDER — TRAMADOL HCL 50 MG PO TABS: ORAL_TABLET | ORAL | 0 refills | Status: DC

## 2019-05-01 NOTE — Telephone Encounter (Signed)
done

## 2019-05-01 NOTE — Telephone Encounter (Signed)
Please refill Tramadol

## 2019-05-02 ENCOUNTER — Other Ambulatory Visit: Payer: Self-pay | Admitting: Hematology and Oncology

## 2019-05-04 ENCOUNTER — Telehealth: Payer: Self-pay | Admitting: *Deleted

## 2019-05-04 ENCOUNTER — Other Ambulatory Visit: Payer: Self-pay | Admitting: Hematology and Oncology

## 2019-05-04 NOTE — Telephone Encounter (Signed)
Patient called for a refill of Hydroxyzine and Acyclovir please.

## 2019-05-04 NOTE — Telephone Encounter (Signed)
It is not appropriate to refill hydroxyzine; it is not to be used for long term I can refill acyclovir

## 2019-05-04 NOTE — Telephone Encounter (Signed)
Telephone call to patient. Had to leave a message- we are unable to process refill request. Please call us back for more info.

## 2019-05-05 ENCOUNTER — Inpatient Hospital Stay: Payer: 59

## 2019-05-05 ENCOUNTER — Inpatient Hospital Stay: Payer: 59 | Attending: Hematology and Oncology

## 2019-05-05 ENCOUNTER — Inpatient Hospital Stay: Payer: 59 | Admitting: Hematology and Oncology

## 2019-05-05 DIAGNOSIS — C8118 Nodular sclerosis classical Hodgkin lymphoma, lymph nodes of multiple sites: Secondary | ICD-10-CM | POA: Insufficient documentation

## 2019-05-05 DIAGNOSIS — Z79899 Other long term (current) drug therapy: Secondary | ICD-10-CM | POA: Insufficient documentation

## 2019-05-05 DIAGNOSIS — Z5112 Encounter for antineoplastic immunotherapy: Secondary | ICD-10-CM | POA: Insufficient documentation

## 2019-05-08 ENCOUNTER — Telehealth: Payer: Self-pay | Admitting: Hematology and Oncology

## 2019-05-08 NOTE — Telephone Encounter (Signed)
Scheduled per 1/8 sch msg. Called and spoke with pt, confirmed 1/26 appt  °

## 2019-05-11 ENCOUNTER — Other Ambulatory Visit: Payer: Self-pay | Admitting: Hematology and Oncology

## 2019-05-11 ENCOUNTER — Telehealth: Payer: Self-pay | Admitting: *Deleted

## 2019-05-11 DIAGNOSIS — B20 Human immunodeficiency virus [HIV] disease: Secondary | ICD-10-CM

## 2019-05-11 MED ORDER — ACYCLOVIR 5 % EX OINT: 1.0000 "application " | TOPICAL_OINTMENT | CUTANEOUS | 0 refills | Status: AC

## 2019-05-11 MED ORDER — TRAMADOL HCL 50 MG PO TABS: ORAL_TABLET | ORAL | 0 refills | Status: DC

## 2019-05-11 NOTE — Telephone Encounter (Signed)
Done Please remind her if she no-show again we will cancel her treatment

## 2019-05-11 NOTE — Telephone Encounter (Signed)
Telephone call returned to patient. She apologized for the appointment last week. She says she did call scheduling to have it cancelled due to the weather the day before. She will not miss her next appointment.

## 2019-05-11 NOTE — Telephone Encounter (Signed)
Patient calling for a refill of Tramadol and the Acyclovir Ointment for her cold sore. Please send to [facility].

## 2019-05-22 ENCOUNTER — Telehealth: Payer: Self-pay | Admitting: *Deleted

## 2019-05-22 ENCOUNTER — Other Ambulatory Visit: Payer: Self-pay | Admitting: Hematology and Oncology

## 2019-05-22 DIAGNOSIS — B20 Human immunodeficiency virus [HIV] disease: Secondary | ICD-10-CM

## 2019-05-22 MED ORDER — TRAMADOL HCL 50 MG PO TABS: ORAL_TABLET | ORAL | 0 refills | Status: DC

## 2019-05-22 NOTE — Telephone Encounter (Signed)
Patient called for a refill of Tramadol please.

## 2019-05-22 NOTE — Telephone Encounter (Signed)
done

## 2019-05-23 ENCOUNTER — Inpatient Hospital Stay: Payer: 59

## 2019-05-23 ENCOUNTER — Encounter: Payer: Self-pay | Admitting: Hematology and Oncology

## 2019-05-23 ENCOUNTER — Inpatient Hospital Stay (HOSPITAL_BASED_OUTPATIENT_CLINIC_OR_DEPARTMENT_OTHER): Payer: 59 | Admitting: Hematology and Oncology

## 2019-05-23 ENCOUNTER — Other Ambulatory Visit: Payer: Self-pay

## 2019-05-23 VITALS — BP 116/80 | HR 60 | Temp 98.3°F | Resp 17 | Ht 64.0 in | Wt 107.4 lb

## 2019-05-23 DIAGNOSIS — F17290 Nicotine dependence, other tobacco product, uncomplicated: Secondary | ICD-10-CM | POA: Diagnosis not present

## 2019-05-23 DIAGNOSIS — C8118 Nodular sclerosis classical Hodgkin lymphoma, lymph nodes of multiple sites: Secondary | ICD-10-CM

## 2019-05-23 DIAGNOSIS — Z79899 Other long term (current) drug therapy: Secondary | ICD-10-CM | POA: Diagnosis not present

## 2019-05-23 DIAGNOSIS — C8198 Hodgkin lymphoma, unspecified, lymph nodes of multiple sites: Secondary | ICD-10-CM

## 2019-05-23 DIAGNOSIS — G893 Neoplasm related pain (acute) (chronic): Secondary | ICD-10-CM

## 2019-05-23 DIAGNOSIS — E559 Vitamin D deficiency, unspecified: Secondary | ICD-10-CM | POA: Diagnosis not present

## 2019-05-23 DIAGNOSIS — Z95828 Presence of other vascular implants and grafts: Secondary | ICD-10-CM

## 2019-05-23 DIAGNOSIS — Z5112 Encounter for antineoplastic immunotherapy: Secondary | ICD-10-CM | POA: Diagnosis present

## 2019-05-23 DIAGNOSIS — Z5111 Encounter for antineoplastic chemotherapy: Secondary | ICD-10-CM

## 2019-05-23 LAB — CBC WITH DIFFERENTIAL/PLATELET
Abs Immature Granulocytes: 0.01 10*3/uL (ref 0.00–0.07)
Basophils Absolute: 0.1 10*3/uL (ref 0.0–0.1)
Basophils Relative: 1 %
Eosinophils Absolute: 0.1 10*3/uL (ref 0.0–0.5)
Eosinophils Relative: 2 %
HCT: 40.1 % (ref 36.0–46.0)
Hemoglobin: 14.3 g/dL (ref 12.0–15.0)
Immature Granulocytes: 0 %
Lymphocytes Relative: 28 %
Lymphs Abs: 2.3 10*3/uL (ref 0.7–4.0)
MCH: 34.6 pg — ABNORMAL HIGH (ref 26.0–34.0)
MCHC: 35.7 g/dL (ref 30.0–36.0)
MCV: 97.1 fL (ref 80.0–100.0)
Monocytes Absolute: 0.5 10*3/uL (ref 0.1–1.0)
Monocytes Relative: 6 %
Neutro Abs: 5.4 10*3/uL (ref 1.7–7.7)
Neutrophils Relative %: 63 %
Platelets: 259 10*3/uL (ref 150–400)
RBC: 4.13 MIL/uL (ref 3.87–5.11)
RDW: 12.4 % (ref 11.5–15.5)
WBC: 8.4 10*3/uL (ref 4.0–10.5)
nRBC: 0 % (ref 0.0–0.2)

## 2019-05-23 LAB — COMPREHENSIVE METABOLIC PANEL
ALT: 11 U/L (ref 0–44)
AST: 16 U/L (ref 15–41)
Albumin: 4.2 g/dL (ref 3.5–5.0)
Alkaline Phosphatase: 107 U/L (ref 38–126)
Anion gap: 9 (ref 5–15)
BUN: 11 mg/dL (ref 6–20)
CO2: 24 mmol/L (ref 22–32)
Calcium: 9.3 mg/dL (ref 8.9–10.3)
Chloride: 107 mmol/L (ref 98–111)
Creatinine, Ser: 0.96 mg/dL (ref 0.44–1.00)
GFR calc Af Amer: >60 mL/min (ref >60)
GFR calc non Af Amer: >60 mL/min (ref >60)
Glucose, Bld: 80 mg/dL (ref 70–99)
Potassium: 3.8 mmol/L (ref 3.5–5.1)
Sodium: 140 mmol/L (ref 135–145)
Total Bilirubin: 1.2 mg/dL (ref 0.3–1.2)
Total Protein: 7 g/dL (ref 6.5–8.1)

## 2019-05-23 LAB — TSH: TSH: 2.702 u[IU]/mL (ref 0.308–3.960)

## 2019-05-23 MED ORDER — SODIUM CHLORIDE 0.9 % IV SOLN
200.0000 mg | Freq: Once | INTRAVENOUS | Status: AC
  Administered 2019-05-23: 200 mg via INTRAVENOUS
  Filled 2019-05-23: qty 8

## 2019-05-23 MED ORDER — SODIUM CHLORIDE 0.9% FLUSH
10.0000 mL | Freq: Once | INTRAVENOUS | Status: AC
  Administered 2019-05-23: 10 mL
  Filled 2019-05-23: qty 10

## 2019-05-23 MED ORDER — SODIUM CHLORIDE 0.9 % IV SOLN
Freq: Once | INTRAVENOUS | Status: AC
  Filled 2019-05-23: qty 250

## 2019-05-23 MED ORDER — SODIUM CHLORIDE 0.9% FLUSH
10.0000 mL | INTRAVENOUS | Status: DC | PRN
  Administered 2019-05-23: 10 mL
  Filled 2019-05-23: qty 10

## 2019-05-23 MED ORDER — HEPARIN SOD (PORK) LOCK FLUSH 100 UNIT/ML IV SOLN
500.0000 [IU] | Freq: Once | INTRAVENOUS | Status: AC | PRN
  Administered 2019-05-23: 500 [IU]
  Filled 2019-05-23: qty 5

## 2019-05-23 NOTE — Assessment & Plan Note (Signed)
We discussed the importance of nicotine cessation The patient is motivated to quit on her own

## 2019-05-23 NOTE — Progress Notes (Signed)
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  Cigar smoker We discussed the importance of nicotine cessation The patient is motivated to quit on her own   Orders Placed This Encounter  Procedures  . CT ABDOMEN PELVIS W CONTRAST    Standing Status:   Future    Standing Expiration Date:   05/22/2020    Order Specific Question:   If indicated for the ordered procedure, I authorize the administration of contrast media per Radiology protocol    Answer:   Yes    Order Specific Question:   Preferred imaging location?    Answer:   [facility]    Order Specific Question:   Radiology Contrast Protocol - do NOT remove file path    Answer:   \\[email or web address]    Order Specific Question:   Is patient pregnant?    Answer:   No  . CT CHEST W CONTRAST    Standing Status:   Future    Standing Expiration Date:   05/22/2020    Order Specific Question:   If indicated for the ordered procedure, I authorize the administration of contrast media per  Radiology protocol    Answer:   Yes    Order Specific Question:   Preferred imaging location?    Answer:   [facility]    Order Specific Question:   Radiology Contrast Protocol - do NOT remove file path    Answer:   \\[email or web address]    Order Specific Question:   Is patient pregnant?    Answer:   No  . VITAMIN D 25 Hydroxy (Vit-D Deficiency, Fractures)    Standing Status:   Future    Standing Expiration Date:   11/19/2020    All questions were answered. The patient knows to call the clinic with any problems, questions or concerns. The total time spent in the appointment was 20 minutes encounter with patients including review of chart and various tests results, discussions about plan of care and coordination of care plan   Heath Lark, MD 05/23/2019 12:07 PM  INTERVAL HISTORY: Please see below for problem oriented charting. 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   05/07/2014 Imaging  CT scan of the chest show right thoracic inlet lymphadenopathy   06/07/2014 Procedure   She underwent ultrasound-guided core biopsy of the neck lymph node   06/07/2014 Pathology Results   Accession: [id number] biopsy confirmed diagnosis of Hodgkin lymphoma.   06/15/2014 Imaging   Echocardiogram showed preserved ejection fraction   07/09/2014 - 07/12/2014 Hospital Admission   She was admitted to the hospital for severe anemia.   07/27/2014 Procedure   She had placement of port   07/31/2014  - 09/11/2014 Chemotherapy   She received dose adjusted chemotherapy due to abnormal liver function tests and severe anemia. Treatment was delayed due to noncompliance  and subsequently stopped because the patient failed to keep appointments   01/11/2015 Imaging   Repeat PET CT scan showed response to treatment   01/28/2015 - 06/18/2015 Chemotherapy   ABVD was restarted with full dose.   02/08/2015 - 02/10/2015 Hospital Admission   The patient was admitted to the hospital due to pancytopenia and profuse diarrhea. Cultures were negative. She was placed on ciprofloxacin.   02/11/2015 Adverse Reaction   Treatment was placed on hold due to recent infection.   04/11/2015 Imaging   PET CT scan showed near complete response. Incidental finding of an abnormal bone lesion, indeterminate. She is not symptomatic. Recommendation from Hem [facility] to observe   07/11/2015 Imaging   PET CT scan showed abnormal new bone lesions, suggestive of possible disease progression   07/23/2015 Bone Marrow Biopsy   She underwent bone biopsy   07/23/2015 Pathology Results   Accession: [id number]  biopsy was negative for cancer   11/21/2015 Surgery   She had surgery for ectopic pregnancy   01/29/2016 Imaging   Ct chest, abdomen and pelvis showed pelvic and retroperitoneal lymphadenopathy, as above, concerning for residual disease. There is also a mildly enlarged posterior mediastinal lymph node measuring 11 mm adjacent to the distal descending thoracic aorta. This may represent an additional focus of disease, but is the only finding of concern in the thorax on today's examination. Sclerosis in the right ilium at site of previously noted metabolically active lesion, grossly unchanged. No other definite osseous lesions are identified on today's examination. Spleen is normal in size and appearance.   02/14/2016 PET scan   Interval disease worsening with new foci of hypermetabolic activity in multiple retroperitoneal and pelvic  lymph nodes as well as AP window and left hilar lymph nodes. (Deauville 5). There is also overall worsening of the osseous disease.   05/11/2016 Pathology Results   Diagnosis Lymph node, needle/core biopsy, Left para-aortic retroperitoneal - CLASSICAL HODGKIN LYMPHOMA. - SEE ONCOLOGY TABLE. Microscopic Comment LYMPHOMA Histologic type: Classical Hodgkin lymphoma. Grade (if applicable): N/A Flow cytometry: Not done. Immunohistochemical stains: CD15, CD20, CD3, LCA, PAX-5, CD30 with appropriate controls. Touch preps/imprints: Not performed. Comments: The sections show small needle core biopsy fragments displaying a polymorphous cellular proliferation of small lymphocytes, plasma cells, eosinophils, and large atypical mononuclear and multilobated lymphoid cells with features of Reed-Sternberg cells and variants. This is associated with patchy areas of fibrosis. Immunohistochemical stains were performed and show that the large atypical lymphoid cells are positive for CD30, CD15 and PAX-5 and negative for LCA, CD20, CD3. The small lymphoid cells in the background show a mixture of T and B cells with predominance of T cells. The overall morphologic and histologic features are consistent with classical Hodgkin lymphoma. Further subtyping is challenging in limited small biopsy fragments but the patchy fibrosis suggests nodular sclerosis type.    05/11/2016 Procedure  She underwent CT guided biopsy of retroperitoneal lymph node   05/26/2016 Procedure   Successful placement of a right IJ approach Power Port with ultrasound and fluoroscopic guidance. The catheter is ready for use.   06/02/2016 PET scan   Mixed response to chemotherapy with some lymph nodes decreased in metabolic activity and some lymph nodes increase metabolic activity. Lymph node stations including mediastinum, periaortic retroperitoneum, and obturator node stations. Activity is remains relatively intense Deauville 4 2. LEFT infrahilar  nodule / lymph node with intense metabolic activity decreased from prior. ( Deauville 4 ). 3. 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 Deauville 5   06/03/2016 - 06/05/2016 Hospital Admission   She was admitted to the hospital for cycle 1 of ICE chemotherapy   06/24/2016 - 06/26/2016 Hospital Admission   She received cycle 2 of ICE chemo   07/07/2016 PET scan   Resolution of prior hypermetabolic adenopathy and resolution of prior osseous foci of hypermetabolic activity compatible with essentially complete response to therapy (Deauville 1). 2. Generalized reduced activity in the L4 vertebral body and the type of finding which would typically reflect prior radiation therapy. 3. Stable septated fatty right pelvic lesion, possibly a dermoid or lipoma, not hypermetabolic   9/75/8832 PET scan   Hypermetabolic lesion along the L3 vertebral body and left posterior elements, max SUV 7.8 (Deauville 5). Hypermetabolic lesion along the left inferior pubic ramus, max SUV 4.9 (Deauville 4).  IMPRESSION: Prevascular lymphadenopathy, reflecting nodal recurrence (Deauville 4). Hypermetabolic osseous metastases involving the L3 vertebral body/posterior elements and left inferior pubic ramus (Deauville 4-5). Hypermetabolism along the endometrium, new, possibly reactive/physiologic. Consider pelvic ultrasound and/or endometrial sampling as clinically warranted.   11/04/2016 - 02/24/2017 Chemotherapy   She received Brentuximab   12/11/2016 PET scan   1. Mixed response to therapy within the skeleton. Lesions at L3 is decreased in size and metabolic activity. Residual activity remains above liver ( Deauville 4) 2. Increased activity in the LEFT sacrum with metabolic activity above liver activity ( Deauville 4 3. Decrease in size and metabolic activity of anterior mediastinal tissue consistent with resolution of thymic hyperplasia or resolution of lymphoma metabolic activity ( Deauville 2). 4.  No new lymphadenopathy. Normal spleen and liver.   03/25/2017 PET scan   1. Increased metabolic activity in several normal sized retroperitoneal and right pelvic lymph nodes, Deauville 4. 2. Increase in size and metabolic activity of lesions in the L3 and L4 vertebral bodies and in the left sacrum, Deauville 5. 3. No recurrence of anterior mediastinal abnormal activity. 4. Other imaging findings of potential clinical significance: Chronic ethmoid and left maxillary sinusitis. Paraseptal emphysema at the lung apices. Right ovarian dermoid.   04/06/2017 -  Chemotherapy   The patient had Keytruda   04/06/2017 -  Chemotherapy   The patient had pembrolizumab (KEYTRUDA) 200 mg in sodium chloride 0.9 % 50 mL chemo infusion, 200 mg, Intravenous, Once, 30 of 31 cycles Administration: 200 mg (04/06/2017), 200 mg (04/29/2017), 200 mg (05/20/2017), 200 mg (06/10/2017), 200 mg (07/01/2017), 200 mg (07/22/2017), 200 mg (08/12/2017), 200 mg (09/02/2017), 200 mg (10/04/2017), 200 mg (11/03/2017), 200 mg (12/02/2017), 200 mg (12/30/2017), 200 mg (01/27/2018), 200 mg (02/24/2018), 200 mg (03/22/2018), 200 mg (04/19/2018), 200 mg (05/17/2018), 200 mg (06/14/2018), 200 mg (07/12/2018), 200 mg (08/09/2018), 200 mg (09/06/2018), 200 mg (10/04/2018), 200 mg (11/01/2018), 200 mg (11/29/2018), 200 mg (12/27/2018), 200 mg (01/24/2019), 200 mg (03/14/2019), 200 mg (05/23/2019)  for chemotherapy treatment.    06/09/2017 PET scan  1. Overall significant improvement with reduction in nodal activity. Several retroperitoneal nodes which were previously Deauville 4 are currently Deauville 3. The right common iliac lymph node remains at Deauville 4 with maximum SUV of 3.4, but is improved from prior SUV of 4.3. 2. The bony lesions show the greatest improvement, with the previously Deauville 5 hypermetabolic lesions currently no longer of higher metabolic activity than surrounding bone marrow, currently measuring at Deauville 3. 3. 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 4. No new lesions are identified. 5. Emphysema (ICD10-J43.9).   08/05/2017 Imaging   1. Somewhat complex left adnexal lesion, potentially hemorrhagic cyst measuring 2.9 x 2.8 cm. The possibility of torsion in the left ovary must be considered. Advise correlation with pelvic ultrasound including Doppler assessment to further evaluate.  2.  Right ovarian dermoid, unchanged from recent PET-CT examination.  3. No adenopathy by size criteria evident. Appearance of subcentimeter mesenteric lymph nodes is stable compared to recent study.  4. No bone lesions appreciable by CT. Areas of abnormal radiotracer uptake in the mid lumbar spine noted on recent PET-CT examination. No destruction 2 or lytic change noted in the lumbar vertebrae currently there.  5. No bowel obstruction. No abscess. No periappendiceal region inflammation.  6.  Small hiatal hernia with fluid in distal esophagus.  7.  Foci of coronary artery calcification, advanced for age.  8.  Spleen normal in size and contour.  9. Small hiatal hernia with mild fluid in the distal esophagus, likely indicative of a degree of reflux.   01/04/2018 Imaging   1. No findings to suggest residual/recurrent lymphoma in the chest, abdomen or pelvis. 2. Right ovarian dermoid slightly larger than prior examinations, currently measuring 3.8 x 2.8 x 3.1 cm. 3. Additional incidental findings, as above.   07/11/2018 Imaging   No findings suspicious for recurrent lymphoma. Spleen is normal in size.  Stable right ovarian dermoid.  4 mm subpleural nodular opacity in the posterior right lower lobe, favoring subpleural atelectasis.     REVIEW OF SYSTEMS:   Constitutional: Denies fevers, chills or abnormal weight loss Eyes: Denies blurriness of vision Ears, nose, mouth, throat, and face: Denies mucositis or sore throat Respiratory: Denies cough, dyspnea or wheezes Cardiovascular:  Denies palpitation, chest discomfort or lower extremity swelling Gastrointestinal:  Denies nausea, heartburn or change in bowel habits Skin: Denies abnormal skin rashes Lymphatics: Denies new lymphadenopathy or easy bruising Neurological:Denies numbness, tingling or new weaknesses Behavioral/Psych: Mood is stable, no new changes  All other systems were reviewed with the patient and are negative.  I have reviewed the past medical history, past surgical history, social history and family history with the patient and they are unchanged from previous note.  ALLERGIES:  is allergic to temazepam.  MEDICATIONS:  Current Outpatient Medications  Medication Sig Dispense Refill  . atazanavir (REYATAZ) 300 MG capsule TAKE ONE CAPSULE BY MOUTH DAILY WITH BREAKFAST. TAKE WITH NORVIR 90 capsule 1  . cholecalciferol (VITAMIN D) 1000 units tablet Take 1 tablet (1,000 Units total) by mouth daily. 30 tablet 9  . Clotrimazole 1 % OINT Apply 1 application topically 2 (two) times daily. 56.7 g 1  . emtricitabine-tenofovir (TRUVADA) 200-300 MG tablet Take 1 tablet by mouth daily. 90 tablet 1  . fluticasone (FLONASE) 50 MCG/ACT nasal spray SHAKE LIQUID AND USE 1 SPRAY IN EACH NOSTRIL DAILY 16 g 0  . hydrocortisone 1 % ointment Apply 1 application topically 2 (two) times daily. To affected area 30 g 2  .  hydrocortisone 2.5 % cream APP EXT AA D    . hydrOXYzine (ATARAX/VISTARIL) 10 MG tablet Take 1 tablet (10 mg total) by mouth 3 (three) times daily as needed. 30 tablet 0  . megestrol (MEGACE) 40 MG tablet     . ondansetron (ZOFRAN ODT) 8 MG disintegrating tablet Take 1 tablet (8 mg total) by mouth every 8 (eight) hours as needed for nausea. 30 tablet 11  . ritonavir (NORVIR) 100 MG TABS tablet TAKE 1 TABLET BY MOUTH EVERY MORNING WITH BREAKFAST. TAKE WITH REYATAZ 90 tablet 1  . traMADol (ULTRAM) 50 MG tablet TAKE 2 TABLETS(100 MG) BY MOUTH EVERY 6 HOURS AS NEEDED FOR MODERATE PAIN 90 tablet 0   No current  facility-administered medications for this visit.   Facility-Administered Medications Ordered in Other Visits  Medication Dose Route Frequency Provider Last Rate Last Admin  . sodium chloride flush (NS) 0.9 % injection 10 mL  10 mL Intracatheter PRN Alvy Bimler, Kynedi Profitt, MD   10 mL at 05/23/19 1141    PHYSICAL EXAMINATION: ECOG PERFORMANCE STATUS: 0 - Asymptomatic  Vitals:   05/23/19 0913  BP: 116/80  Pulse: 60  Resp: 17  Temp: 98.3 F (36.8 C)  SpO2: 100%   Filed Weights   05/23/19 0913  Weight: 107 lb 6.4 oz (48.7 kg)    GENERAL:alert, no distress and comfortable SKIN: skin color, texture, turgor are normal, no rashes or significant lesions EYES: normal, Conjunctiva are pink and non-injected, sclera clear OROPHARYNX:no exudate, no erythema and lips, buccal mucosa, and tongue normal  NECK: supple, thyroid normal size, non-tender, without nodularity LYMPH:  no palpable lymphadenopathy in the cervical, axillary or inguinal LUNGS: clear to auscultation and percussion with normal breathing effort HEART: regular rate & rhythm and no murmurs and no lower extremity edema ABDOMEN:abdomen soft, non-tender and normal bowel sounds Musculoskeletal:no cyanosis of digits and no clubbing  NEURO: alert & oriented x 3 with fluent speech, no focal motor/sensory deficits  LABORATORY DATA:  I have reviewed the data as listed    Component Value Date/Time   NA 140 05/23/2019 0902   NA 140 04/29/2017 0745   K 3.8 05/23/2019 0902   K 3.3 (L) 04/29/2017 0745   CL 107 05/23/2019 0902   CO2 24 05/23/2019 0902   CO2 23 04/29/2017 0745   GLUCOSE 80 05/23/2019 0902   GLUCOSE 83 04/29/2017 0745   BUN 11 05/23/2019 0902   BUN 10.7 04/29/2017 0745   CREATININE 0.96 05/23/2019 0902   CREATININE 1.2 (H) 04/29/2017 0745   CALCIUM 9.3 05/23/2019 0902   CALCIUM 9.8 04/29/2017 0745   PROT 7.0 05/23/2019 0902   PROT 7.6 04/29/2017 0745   ALBUMIN 4.2 05/23/2019 0902   ALBUMIN 4.0 04/29/2017 0745   AST 16  05/23/2019 0902   AST 19 04/29/2017 0745   ALT 11 05/23/2019 0902   ALT 19 04/29/2017 0745   ALKPHOS 107 05/23/2019 0902   ALKPHOS 81 04/29/2017 0745   BILITOT 1.2 05/23/2019 0902   BILITOT 1.31 (H) 04/29/2017 0745   GFRNONAA >60 05/23/2019 0902   GFRNONAA 83 02/20/2016 0908   GFRAA >60 05/23/2019 0902   GFRAA >89 02/20/2016 0908    No results found for: SPEP, UPEP  Lab Results  Component Value Date   WBC 8.4 05/23/2019   NEUTROABS 5.4 05/23/2019   HGB 14.3 05/23/2019   HCT 40.1 05/23/2019   MCV 97.1 05/23/2019   PLT 259 05/23/2019      Chemistry      Component  Value Date/Time   NA 140 05/23/2019 0902   NA 140 04/29/2017 0745   K 3.8 05/23/2019 0902   K 3.3 (L) 04/29/2017 0745   CL 107 05/23/2019 0902   CO2 24 05/23/2019 0902   CO2 23 04/29/2017 0745   BUN 11 05/23/2019 0902   BUN 10.7 04/29/2017 0745   CREATININE 0.96 05/23/2019 0902   CREATININE 1.2 (H) 04/29/2017 0745      Component Value Date/Time   CALCIUM 9.3 05/23/2019 0902   CALCIUM 9.8 04/29/2017 0745   ALKPHOS 107 05/23/2019 0902   ALKPHOS 81 04/29/2017 0745   AST 16 05/23/2019 0902   AST 19 04/29/2017 0745   ALT 11 05/23/2019 0902   ALT 19 04/29/2017 0745   BILITOT 1.2 05/23/2019 0902   BILITOT 1.31 (H) 04/29/2017 0745

## 2019-05-23 NOTE — Assessment & Plan Note (Signed)
Since I saw the patient, she tolerated treatment better when we space it out to 6-8 weeks I will see her again in March with repeat CT imaging The patient wants to stop Lupron/Zoladex injection because it caused severe bone pain Clinically, she have no signs or symptoms to suggest cancer recurrence

## 2019-05-23 NOTE — Assessment & Plan Note (Signed)
She has multiple chronic joint pain and is dependent on tramadol to control her joint pain She is taking vitamin D supplement as prescribed We discussed refill policy. 

## 2019-05-23 NOTE — Patient Instructions (Signed)
[facility] Discharge Instructions for Patients Receiving Chemotherapy  Today you received the following chemotherapy agents:  Keytruda.  To help prevent nausea and vomiting after your treatment, we encourage you to take your nausea medication as directed.   If you develop nausea and vomiting that is not controlled by your nausea medication, call the clinic.   BELOW ARE SYMPTOMS THAT SHOULD BE REPORTED IMMEDIATELY:  *FEVER GREATER THAN 100.5 F  *CHILLS WITH OR WITHOUT FEVER  NAUSEA AND VOMITING THAT IS NOT CONTROLLED WITH YOUR NAUSEA MEDICATION  *UNUSUAL SHORTNESS OF BREATH  *UNUSUAL BRUISING OR BLEEDING  TENDERNESS IN MOUTH AND THROAT WITH OR WITHOUT PRESENCE OF ULCERS  *URINARY PROBLEMS  *BOWEL PROBLEMS  UNUSUAL RASH Items with * indicate a potential emergency and should be followed up as soon as possible.  Feel free to call the clinic should you have any questions or concerns. The clinic phone number is [phone].  Please show the CHEMO ALERT CARD at check-in to the Emergency Department and triage nurse.    

## 2019-05-24 ENCOUNTER — Telehealth: Payer: Self-pay | Admitting: Hematology and Oncology

## 2019-05-24 ENCOUNTER — Other Ambulatory Visit: Payer: Self-pay

## 2019-05-24 ENCOUNTER — Other Ambulatory Visit: Payer: Self-pay | Admitting: Hematology and Oncology

## 2019-05-24 ENCOUNTER — Telehealth: Payer: Self-pay

## 2019-05-24 DIAGNOSIS — B20 Human immunodeficiency virus [HIV] disease: Secondary | ICD-10-CM

## 2019-05-24 MED ORDER — PREDNISONE 10 MG PO TABS: 10.0000 mg | ORAL_TABLET | Freq: Every day | ORAL | 0 refills | Status: AC

## 2019-05-24 NOTE — Telephone Encounter (Signed)
I talk with patient regarding schedule  

## 2019-05-24 NOTE — Telephone Encounter (Signed)
She called and left a message to call her.  Called back. She is complaining of itching all over. She is taking benadryl by mouth but she is taking so much she is afraid that is going to take too many. She had tried hydrocortisone cream for the itching. She is looking for suggestions. Asking for a prescription cream if possible.

## 2019-05-24 NOTE — Telephone Encounter (Signed)
Called and given below message. She verbalized understanding. Rx sent to pharmacy. °

## 2019-05-24 NOTE — Telephone Encounter (Signed)
Ok to call in 10 mg prednisone for 7 days

## 2019-05-31 ENCOUNTER — Telehealth: Payer: Self-pay | Admitting: *Deleted

## 2019-05-31 NOTE — Telephone Encounter (Signed)
Patient called to request a Tramadol refill please.

## 2019-06-01 ENCOUNTER — Other Ambulatory Visit: Payer: Self-pay | Admitting: Hematology and Oncology

## 2019-06-01 DIAGNOSIS — B20 Human immunodeficiency virus [HIV] disease: Secondary | ICD-10-CM

## 2019-06-01 MED ORDER — TRAMADOL HCL 50 MG PO TABS: ORAL_TABLET | ORAL | 0 refills | Status: DC

## 2019-06-04 ENCOUNTER — Other Ambulatory Visit: Payer: Self-pay | Admitting: Hematology

## 2019-06-04 DIAGNOSIS — L299 Pruritus, unspecified: Secondary | ICD-10-CM

## 2019-06-04 MED ORDER — HYDROXYZINE HCL 10 MG PO TABS: 10.0000 mg | ORAL_TABLET | Freq: Three times a day (TID) | ORAL | 0 refills | Status: DC | PRN

## 2019-06-05 ENCOUNTER — Telehealth: Payer: Self-pay

## 2019-06-05 NOTE — Telephone Encounter (Signed)
Called and left a message asking her to call the office back regarding after hours call over the weekend.

## 2019-06-12 ENCOUNTER — Other Ambulatory Visit: Payer: Self-pay | Admitting: Hematology and Oncology

## 2019-06-12 ENCOUNTER — Telehealth: Payer: Self-pay

## 2019-06-12 DIAGNOSIS — B20 Human immunodeficiency virus [HIV] disease: Secondary | ICD-10-CM

## 2019-06-12 MED ORDER — TRAMADOL HCL 50 MG PO TABS: ORAL_TABLET | ORAL | 0 refills | Status: DC

## 2019-06-12 NOTE — Telephone Encounter (Signed)
Done I asked her to schedule CT in the past, not done yet

## 2019-06-12 NOTE — Telephone Encounter (Signed)
Called and given below message. She verbalized understanding. She will call and schedule scan today. Phone # given for radiology to schedule.

## 2019-06-12 NOTE — Telephone Encounter (Signed)
She called and left a message. Requesting refill on Tramadol.

## 2019-06-19 ENCOUNTER — Ambulatory Visit (INDEPENDENT_AMBULATORY_CARE_PROVIDER_SITE_OTHER): Payer: 59 | Admitting: Internal Medicine

## 2019-06-19 ENCOUNTER — Other Ambulatory Visit (HOSPITAL_COMMUNITY)
Admission: RE | Admit: 2019-06-19 | Discharge: 2019-06-19 | Disposition: A | Discharge status: Home / Self Care | Payer: 59 | Source: Ambulatory Visit | Attending: Internal Medicine | Admitting: Internal Medicine

## 2019-06-19 ENCOUNTER — Encounter: Payer: Self-pay | Admitting: Internal Medicine

## 2019-06-19 ENCOUNTER — Other Ambulatory Visit: Payer: Self-pay

## 2019-06-19 VITALS — BP 118/74 | HR 66 | Temp 98.4°F | Wt 106.0 lb

## 2019-06-19 DIAGNOSIS — B20 Human immunodeficiency virus [HIV] disease: Secondary | ICD-10-CM

## 2019-06-19 DIAGNOSIS — N9089 Other specified noninflammatory disorders of vulva and perineum: Secondary | ICD-10-CM

## 2019-06-19 DIAGNOSIS — Z113 Encounter for screening for infections with a predominantly sexual mode of transmission: Secondary | ICD-10-CM

## 2019-06-19 DIAGNOSIS — Z79899 Other long term (current) drug therapy: Secondary | ICD-10-CM | POA: Diagnosis not present

## 2019-06-19 NOTE — Assessment & Plan Note (Signed)
Labs today and rtc 4 months.

## 2019-06-19 NOTE — Assessment & Plan Note (Signed)
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  I will have her see gynecology for ? Biopsy or treatment.  Referral placed

## 2019-06-19 NOTE — Progress Notes (Signed)
   Subjective:    Patient ID: [name], female    DOB: May 04, 1985, 34 y.o.   MRN: [id number]  HPI Here for follow up of HIV She continues on Reyataz, norvir and Truvada and no missed doses.  Not interested in streamlining regimen and remains comfortable with what she is on.  Has a new left labial lesion that scabs and is painful.  Otherwise continues to follow with oncology and tolerating treatments.     Review of Systems  Constitutional: Negative for fatigue and fever.  Gastrointestinal: Negative for diarrhea.       Objective:   Physical Exam  Constitutional: She appears well-developed and well-nourished. No distress.  Eyes: No scleral icterus.  Cardiovascular: Normal rate, regular rhythm and normal heart sounds.  No murmur heard. Pulmonary/Chest: Effort normal and breath sounds normal. No respiratory distress.  Genitourinary:    Genitourinary Comments: Left lesion on outer labia, nodular, non-tender, no drainage.  Not an ulcer.  Firm.    Neurological: She is alert.  Skin: No rash noted.  Psychiatric: She has a normal mood and affect.   SH: still occasional tobacco       Assessment & Plan:

## 2019-06-19 NOTE — Addendum Note (Signed)
Addended by: Landis Gandy on: 06/19/2019 09:10 AM   Modules accepted: Orders

## 2019-06-19 NOTE — Assessment & Plan Note (Signed)
I will screen today

## 2019-06-20 ENCOUNTER — Other Ambulatory Visit: Payer: Self-pay

## 2019-06-20 DIAGNOSIS — B20 Human immunodeficiency virus [HIV] disease: Secondary | ICD-10-CM

## 2019-06-20 LAB — URINE CYTOLOGY ANCILLARY ONLY
Chlamydia: NEGATIVE
Comment: NEGATIVE
Comment: NORMAL
Neisseria Gonorrhea: NEGATIVE

## 2019-06-20 LAB — T-HELPER CELL (CD4) - (RCID CLINIC ONLY)
CD4 % Helper T Cell: 45 % (ref 33–65)
CD4 T Cell Abs: 1012 /uL (ref 400–1790)

## 2019-06-20 MED ORDER — RITONAVIR 100 MG PO TABS: ORAL_TABLET | ORAL | 1 refills | Status: DC

## 2019-06-20 MED ORDER — EMTRICITABINE-TENOFOVIR DF 200-300 MG PO TABS: 1.0000 | ORAL_TABLET | Freq: Every day | ORAL | 1 refills | Status: DC

## 2019-06-20 MED ORDER — ATAZANAVIR SULFATE 300 MG PO CAPS: ORAL_CAPSULE | ORAL | 1 refills | Status: DC

## 2019-06-21 ENCOUNTER — Telehealth: Payer: Self-pay

## 2019-06-21 NOTE — Telephone Encounter (Signed)
She called and requested a refill on Tramadol Rx. 

## 2019-06-22 ENCOUNTER — Other Ambulatory Visit: Payer: Self-pay | Admitting: Hematology and Oncology

## 2019-06-22 DIAGNOSIS — B20 Human immunodeficiency virus [HIV] disease: Secondary | ICD-10-CM

## 2019-06-22 LAB — LIPID PANEL
Cholesterol: 176 mg/dL (ref <200)
HDL: 41 mg/dL — ABNORMAL LOW (ref >=50)
LDL Cholesterol (Calc): 117 mg/dL (calc) — ABNORMAL HIGH
Non-HDL Cholesterol (Calc): 135 mg/dL (calc) — ABNORMAL HIGH (ref <130)
Total CHOL/HDL Ratio: 4.3 (calc) (ref <5.0)
Triglycerides: 83 mg/dL (ref <150)

## 2019-06-22 LAB — RPR: RPR Ser Ql: NONREACTIVE

## 2019-06-22 LAB — HIV-1 RNA QUANT-NO REFLEX-BLD
HIV 1 RNA Quant: <20 copies/mL
HIV-1 RNA Quant, Log: <1.3 Log copies/mL

## 2019-06-22 MED ORDER — TRAMADOL HCL 50 MG PO TABS: ORAL_TABLET | ORAL | 0 refills | Status: DC

## 2019-06-22 NOTE — Telephone Encounter (Signed)
Called and told her Rx sent. She verbalized understanding. 

## 2019-06-22 NOTE — Telephone Encounter (Signed)
done

## 2019-06-27 ENCOUNTER — Ambulatory Visit (INDEPENDENT_AMBULATORY_CARE_PROVIDER_SITE_OTHER): Payer: 59 | Admitting: Obstetrics & Gynecology

## 2019-06-27 ENCOUNTER — Encounter: Payer: Self-pay | Admitting: Obstetrics & Gynecology

## 2019-06-27 ENCOUNTER — Other Ambulatory Visit: Payer: Self-pay

## 2019-06-27 VITALS — BP 120/72 | Ht 63.0 in | Wt 106.2 lb

## 2019-06-27 DIAGNOSIS — B20 Human immunodeficiency virus [HIV] disease: Secondary | ICD-10-CM | POA: Diagnosis not present

## 2019-06-27 DIAGNOSIS — A630 Anogenital (venereal) warts: Secondary | ICD-10-CM | POA: Diagnosis not present

## 2019-06-27 DIAGNOSIS — N9089 Other specified noninflammatory disorders of vulva and perineum: Secondary | ICD-10-CM | POA: Diagnosis not present

## 2019-06-27 DIAGNOSIS — K620 Anal polyp: Secondary | ICD-10-CM

## 2019-06-27 MED ORDER — CONDYLOX 0.5 % EX GEL: Freq: Two times a day (BID) | CUTANEOUS | 1 refills | Status: DC

## 2019-06-27 NOTE — Progress Notes (Signed)
[name] 1985-08-03 [id number]   History:    34 y.o. [place] (SAb/Tab/Ectopic)    RP:  New patient presenting for vulvar and peri-anal lesions  HPI: Patient is HIV positvie on Truvada, Norvir and Reyataz.  HIV-1 RNA Not detectable 06/19/19.  STI screen Negative 05/2019.  Pap Negative in 2020 per patient.  Using Condoms.  Left vulvar sore coming and going x 6 months.  Peri-anal warts x many years.    Past medical history,surgical history, family history and social history were all reviewed and documented in the EPIC chart.  Gynecologic History Patient's last menstrual period was 06/19/2019.  Obstetric History OB History  Gravida Para Term Preterm AB Living  5 0 0   2 0  SAB TAB Ectopic Multiple Live Births  1   1        # Outcome Date GA Lbr Len/2nd Weight Sex Delivery Anes PTL Lv  5 Gravida           4 Ectopic 07/01/10 [redacted]w[redacted]d            Birth Comments: left ectopic; methotrexate  3 Gravida           2 Gravida              Birth Comments: System Generated. Please review and update pregnancy details.  1 SAB  [redacted]w[redacted]d            Birth Comments: d and c     ROS: A ROS was performed and pertinent positives and negatives are included in the history.  GENERAL: No fevers or chills. HEENT: No change in vision, no earache, sore throat or sinus congestion. NECK: No pain or stiffness. CARDIOVASCULAR: No chest pain or pressure. No palpitations. PULMONARY: No shortness of breath, cough or wheeze. GASTROINTESTINAL: No abdominal pain, nausea, vomiting or diarrhea, melena or bright red blood per rectum. GENITOURINARY: No urinary frequency, urgency, hesitancy or dysuria. MUSCULOSKELETAL: No joint or muscle pain, no back pain, no recent trauma. DERMATOLOGIC: No rash, no itching, no lesions. ENDOCRINE: No polyuria, polydipsia, no heat or cold intolerance. No recent change in weight. HEMATOLOGICAL: No anemia or easy bruising or bleeding. NEUROLOGIC: No headache, seizures, numbness, tingling or  weakness. PSYCHIATRIC: No depression, no loss of interest in normal activity or change in sleep pattern.     Exam:   BP 120/72   Ht 5\' 3"  (1.6 m)   Wt 106 lb 3.2 oz (48.2 kg)   LMP 06/19/2019 Comment: NO BIRTH CONTROL  BMI 18.81 kg/m   Body mass index is 18.81 kg/m.  General appearance : Well developed well nourished female. No acute distress  Pelvic: Vulva: Left mid vulvar round slightly elevated, depigmented lesion about 5 mm.  No erythema, no drainage, not tender currently.               Anus: Soft Polyp like lesion at anal area about 1 x 1.5 cm, on a large pedicle.  A few peri-anal condylomas present.   Assessment/Plan:  34 y.o. female for annual exam   1. 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  Not typical of Herpes or Condyloma.  F/U excision.  2. Condyloma acuminatum Perianal Condylomas.  Will treat with Podofilox.  Usage reviewed and prescription sent to pharmacy.  If not completely resolved, will apply TCA at f/u visit.  Will then refer to general surgeon to evaluate the Rectum.  3. Anal polyp Soft solid polypoid lesion at the anus.  Not warty in appearance  and not a hemorrhoid.  F/U excision.  Will then refer to general surgeon to evaluate the Rectum.  4. Human immunodeficiency virus (HIV) disease ([place]) HIV-1 RNA Not detected 06/19/19  Other orders - podofilox (CONDYLOX) 0.5 % gel; Apply topically 2 (two) times daily for 7 days.  Princess Bruins MD, 10:32 AM 06/27/2019

## 2019-06-27 NOTE — Patient Instructions (Addendum)
1. 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  Not typical of Herpes or Condyloma.  F/U excision.  2. Condyloma acuminatum Perianal Condylomas.  Will treat with Podofilox.  Usage reviewed and prescription sent to pharmacy.  If not completely resolved, will apply TCA at f/u visit.  Will then refer to general surgeon to evaluate the Rectum.  3. Anal polyp Soft solid polypoid lesion at the anus.  Not warty in appearance and not a hemorrhoid.  F/U excision.  Will then refer to general surgeon to evaluate the Rectum.  4. Human immunodeficiency virus (HIV) disease ([place]) HIV-1 RNA Not detected on 06/19/2019.  Other orders - podofilox (CONDYLOX) 0.5 % gel; Apply topically 2 (two) times daily for 7 days.   Naiovy, it was a pleasure meeting you today!

## 2019-06-30 ENCOUNTER — Telehealth: Payer: Self-pay | Admitting: *Deleted

## 2019-06-30 MED ORDER — IMIQUIMOD 5 % EX CREA: TOPICAL_CREAM | CUTANEOUS | 1 refills | Status: DC

## 2019-06-30 NOTE — Telephone Encounter (Signed)
Please send prescription of Imiquimod.

## 2019-06-30 NOTE — Telephone Encounter (Signed)
New Rx sent.

## 2019-06-30 NOTE — Telephone Encounter (Signed)
Imiqui

## 2019-06-30 NOTE — Telephone Encounter (Signed)
PA done via [facility] for podofilox 0.5 % gel, medication denied by insurance.  Covered drugs are Imiquimod, Zyclara cream. Are one of these drugs an option for patient?  Please advise

## 2019-07-03 ENCOUNTER — Telehealth: Payer: Self-pay

## 2019-07-03 ENCOUNTER — Other Ambulatory Visit: Payer: Self-pay | Admitting: Hematology and Oncology

## 2019-07-03 DIAGNOSIS — B20 Human immunodeficiency virus [HIV] disease: Secondary | ICD-10-CM

## 2019-07-03 MED ORDER — TRAMADOL HCL 50 MG PO TABS: ORAL_TABLET | ORAL | 0 refills | Status: DC

## 2019-07-03 NOTE — Telephone Encounter (Signed)
done

## 2019-07-03 NOTE — Telephone Encounter (Signed)
She called and left a message asking for a refill of Tramadol. Ask that RX be sent to [facility].

## 2019-07-17 ENCOUNTER — Ambulatory Visit (HOSPITAL_COMMUNITY)
Admission: RE | Admit: 2019-07-17 | Discharge: 2019-07-17 | Disposition: A | Discharge status: Home / Self Care | Payer: 59 | Source: Ambulatory Visit | Attending: Hematology and Oncology | Admitting: Hematology and Oncology

## 2019-07-17 ENCOUNTER — Other Ambulatory Visit: Payer: Self-pay | Admitting: Hematology and Oncology

## 2019-07-17 ENCOUNTER — Other Ambulatory Visit: Payer: Self-pay

## 2019-07-17 ENCOUNTER — Telehealth: Payer: Self-pay

## 2019-07-17 DIAGNOSIS — C8118 Nodular sclerosis classical Hodgkin lymphoma, lymph nodes of multiple sites: Secondary | ICD-10-CM

## 2019-07-17 MED ORDER — IOHEXOL 300 MG/ML  SOLN
100.0000 mL | Freq: Once | INTRAMUSCULAR | Status: AC | PRN
  Administered 2019-07-17: 100 mL via INTRAVENOUS

## 2019-07-17 MED ORDER — SODIUM CHLORIDE (PF) 0.9 % IJ SOLN
INTRAMUSCULAR | Status: AC
  Filled 2019-07-17: qty 50

## 2019-07-17 NOTE — Telephone Encounter (Signed)
She called and left a message asking for a refill on Tramadol. 

## 2019-07-17 NOTE — Telephone Encounter (Signed)
It's not due for refill until tomorrow I see her tomorrow morning and I will refill it then

## 2019-07-17 NOTE — Telephone Encounter (Signed)
Called and left below message. Left appt times message. Ask her to call the office for questions.

## 2019-07-18 ENCOUNTER — Encounter: Payer: Self-pay | Admitting: Hematology and Oncology

## 2019-07-18 ENCOUNTER — Inpatient Hospital Stay (HOSPITAL_BASED_OUTPATIENT_CLINIC_OR_DEPARTMENT_OTHER): Payer: 59 | Admitting: Hematology and Oncology

## 2019-07-18 ENCOUNTER — Inpatient Hospital Stay: Payer: 59

## 2019-07-18 ENCOUNTER — Inpatient Hospital Stay: Payer: 59 | Attending: Hematology and Oncology

## 2019-07-18 ENCOUNTER — Telehealth: Payer: Self-pay | Admitting: Hematology and Oncology

## 2019-07-18 ENCOUNTER — Other Ambulatory Visit: Payer: Self-pay

## 2019-07-18 DIAGNOSIS — C8198 Hodgkin lymphoma, unspecified, lymph nodes of multiple sites: Secondary | ICD-10-CM

## 2019-07-18 DIAGNOSIS — C8118 Nodular sclerosis classical Hodgkin lymphoma, lymph nodes of multiple sites: Secondary | ICD-10-CM | POA: Diagnosis present

## 2019-07-18 DIAGNOSIS — B20 Human immunodeficiency virus [HIV] disease: Secondary | ICD-10-CM | POA: Insufficient documentation

## 2019-07-18 DIAGNOSIS — Z79899 Other long term (current) drug therapy: Secondary | ICD-10-CM | POA: Insufficient documentation

## 2019-07-18 DIAGNOSIS — D270 Benign neoplasm of right ovary: Secondary | ICD-10-CM | POA: Insufficient documentation

## 2019-07-18 DIAGNOSIS — M2550 Pain in unspecified joint: Secondary | ICD-10-CM

## 2019-07-18 DIAGNOSIS — E559 Vitamin D deficiency, unspecified: Secondary | ICD-10-CM

## 2019-07-18 DIAGNOSIS — Z95828 Presence of other vascular implants and grafts: Secondary | ICD-10-CM

## 2019-07-18 DIAGNOSIS — G8929 Other chronic pain: Secondary | ICD-10-CM | POA: Insufficient documentation

## 2019-07-18 DIAGNOSIS — Z5111 Encounter for antineoplastic chemotherapy: Secondary | ICD-10-CM

## 2019-07-18 LAB — TSH: TSH: 3.485 u[IU]/mL (ref 0.308–3.960)

## 2019-07-18 LAB — COMPREHENSIVE METABOLIC PANEL
ALT: 17 U/L (ref 0–44)
AST: 20 U/L (ref 15–41)
Albumin: 4 g/dL (ref 3.5–5.0)
Alkaline Phosphatase: 87 U/L (ref 38–126)
Anion gap: 11 (ref 5–15)
BUN: 11 mg/dL (ref 6–20)
CO2: 23 mmol/L (ref 22–32)
Calcium: 9.2 mg/dL (ref 8.9–10.3)
Chloride: 106 mmol/L (ref 98–111)
Creatinine, Ser: 1.05 mg/dL — ABNORMAL HIGH (ref 0.44–1.00)
GFR calc Af Amer: >60 mL/min (ref >60)
GFR calc non Af Amer: >60 mL/min (ref >60)
Glucose, Bld: 92 mg/dL (ref 70–99)
Potassium: 3.4 mmol/L — ABNORMAL LOW (ref 3.5–5.1)
Sodium: 140 mmol/L (ref 135–145)
Total Bilirubin: 1.4 mg/dL — ABNORMAL HIGH (ref 0.3–1.2)
Total Protein: 6.7 g/dL (ref 6.5–8.1)

## 2019-07-18 LAB — CBC WITH DIFFERENTIAL/PLATELET
Abs Immature Granulocytes: 0.01 10*3/uL (ref 0.00–0.07)
Basophils Absolute: 0.1 10*3/uL (ref 0.0–0.1)
Basophils Relative: 1 %
Eosinophils Absolute: 0.1 10*3/uL (ref 0.0–0.5)
Eosinophils Relative: 2 %
HCT: 38.5 % (ref 36.0–46.0)
Hemoglobin: 13.7 g/dL (ref 12.0–15.0)
Immature Granulocytes: 0 %
Lymphocytes Relative: 32 %
Lymphs Abs: 1.8 10*3/uL (ref 0.7–4.0)
MCH: 34.5 pg — ABNORMAL HIGH (ref 26.0–34.0)
MCHC: 35.6 g/dL (ref 30.0–36.0)
MCV: 97 fL (ref 80.0–100.0)
Monocytes Absolute: 0.4 10*3/uL (ref 0.1–1.0)
Monocytes Relative: 7 %
Neutro Abs: 3.3 10*3/uL (ref 1.7–7.7)
Neutrophils Relative %: 58 %
Platelets: 256 10*3/uL (ref 150–400)
RBC: 3.97 MIL/uL (ref 3.87–5.11)
RDW: 12.4 % (ref 11.5–15.5)
WBC: 5.8 10*3/uL (ref 4.0–10.5)
nRBC: 0 % (ref 0.0–0.2)

## 2019-07-18 LAB — VITAMIN D 25 HYDROXY (VIT D DEFICIENCY, FRACTURES): Vit D, 25-Hydroxy: 42.08 ng/mL (ref 30–100)

## 2019-07-18 MED ORDER — SODIUM CHLORIDE 0.9% FLUSH
10.0000 mL | Freq: Once | INTRAVENOUS | Status: AC
  Administered 2019-07-18: 10 mL
  Filled 2019-07-18: qty 10

## 2019-07-18 MED ORDER — TRAMADOL HCL 50 MG PO TABS: ORAL_TABLET | ORAL | 0 refills | Status: DC

## 2019-07-18 NOTE — Assessment & Plan Note (Signed)
She has remarkable complete response after being on pembrolizumab for over 2 years Due to side effects with pembrolizumab, I recommend discontinuation of chemotherapy and for her to go on chemotherapy holiday I plan to repeat CT imaging again in 6 months in September She is in agreement She would like to keep her port patent with port flushes I plan to see her again in 8 weeks for further follow-up

## 2019-07-18 NOTE — Assessment & Plan Note (Signed)
This is present but unchanged on recent CT We discussed the risk and benefit of surgical referral but she would like to be observed with serial imaging study only

## 2019-07-18 NOTE — Telephone Encounter (Signed)
Scheduled per 3/23 sch msg. Called and spoke with pt, confirmed 5/18 and 7/13 appts

## 2019-07-18 NOTE — Progress Notes (Signed)
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   All questions were answered. The patient knows to call the clinic with any problems, questions or concerns. The total time spent in the appointment was 20 minutes encounter with patients including review of chart and various tests results,  discussions about plan of care and coordination of care plan   Heath Lark, MD 07/18/2019 9:49 AM  INTERVAL HISTORY: Please see below for problem oriented charting. 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   05/07/2014 Imaging   CT scan of the chest show right thoracic inlet lymphadenopathy   06/07/2014 Procedure   She underwent ultrasound-guided core biopsy of the neck lymph node   06/07/2014 Pathology Results   Accession: [id number] biopsy confirmed diagnosis of Hodgkin lymphoma.   06/15/2014 Imaging   Echocardiogram showed preserved ejection fraction   07/09/2014 - 07/12/2014 Hospital Admission   She was admitted to the hospital for severe anemia.   07/27/2014 Procedure   She had placement of port   07/31/2014 - 09/11/2014 Chemotherapy   She received dose adjusted chemotherapy due to abnormal liver function tests and severe anemia. Treatment was delayed due to noncompliance  and subsequently stopped because the patient failed to keep appointments   01/11/2015 Imaging   Repeat PET CT scan showed response to treatment   01/28/2015 - 06/18/2015 Chemotherapy   ABVD was restarted with full dose.   02/08/2015 - 02/10/2015 Hospital Admission   The patient was admitted to the hospital due to pancytopenia and profuse diarrhea. Cultures were negative. She was placed on ciprofloxacin.   02/11/2015 Adverse Reaction   Treatment was placed on hold due to recent  infection.   04/11/2015 Imaging   PET CT scan showed near complete response. Incidental finding of an abnormal bone lesion, indeterminate. She is not symptomatic. Recommendation from [facility] to observe    07/11/2015 Imaging   PET CT scan showed abnormal new bone lesions, suggestive of possible disease progression   07/23/2015 Bone Marrow Biopsy   She underwent bone biopsy   07/23/2015 Pathology Results   Accession: [id number]  biopsy was negative for cancer   11/21/2015 Surgery   She had surgery for ectopic pregnancy   01/29/2016 Imaging   Ct chest, abdomen and pelvis showed pelvic and retroperitoneal lymphadenopathy, as above, concerning for residual disease. There is also a mildly enlarged posterior mediastinal lymph node measuring 11 mm adjacent to the distal descending thoracic aorta. This may represent an additional focus of disease, but is the only finding of concern in the thorax on today's examination. Sclerosis in the right ilium at site of previously noted metabolically active lesion, grossly unchanged. No other definite osseous lesions are identified on today's examination. Spleen is normal in size and appearance.   02/14/2016 PET scan   Interval disease worsening with new foci of hypermetabolic activity in multiple retroperitoneal and pelvic lymph nodes as well as AP window and left hilar lymph nodes. (Deauville 5). There is also overall worsening of the osseous disease.   05/11/2016 Pathology Results   Diagnosis Lymph node, needle/core biopsy, Left para-aortic retroperitoneal - CLASSICAL HODGKIN LYMPHOMA. - SEE ONCOLOGY TABLE. Microscopic Comment LYMPHOMA Histologic type: Classical Hodgkin lymphoma. Grade (if applicable): N/A Flow cytometry: Not done. Immunohistochemical stains: CD15, CD20, CD3, LCA, PAX-5, CD30 with appropriate controls. Touch preps/imprints: Not performed. Comments: The sections show small needle core biopsy fragments displaying a polymorphous cellular proliferation of small lymphocytes, plasma cells, eosinophils, and large atypical mononuclear and multilobated lymphoid cells with features of Reed-Sternberg cells and variants. This is associated with patchy  areas of fibrosis. Immunohistochemical stains were performed and show that the large atypical lymphoid cells are positive for CD30, CD15 and PAX-5 and negative for LCA, CD20, CD3. The small lymphoid cells in the background show a mixture of T and B cells with predominance of T cells. The overall morphologic and histologic features are consistent with classical Hodgkin lymphoma. Further subtyping is challenging in limited small biopsy fragments but the patchy fibrosis suggests nodular sclerosis type.    05/11/2016 Procedure   She underwent CT guided biopsy of retroperitoneal lymph node   05/26/2016 Procedure   Successful placement of a right IJ approach Power Port with ultrasound and fluoroscopic guidance. The catheter is ready for use.   06/02/2016 PET scan   Mixed response to chemotherapy with some lymph nodes decreased in metabolic activity and some lymph nodes increase metabolic activity. Lymph node stations including mediastinum, periaortic retroperitoneum, and obturator node stations. Activity is remains relatively intense Deauville 4 2. LEFT infrahilar nodule / lymph node with intense metabolic activity decreased from prior. ( Deauville 4 ). 3. 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 Deauville 5   06/03/2016 - 06/05/2016 Hospital Admission   She was admitted to the hospital for cycle 1 of ICE chemotherapy   06/24/2016 - 06/26/2016 Hospital Admission   She received cycle 2 of ICE chemo   07/07/2016 PET scan   Resolution of prior hypermetabolic adenopathy and resolution of prior osseous foci of hypermetabolic activity compatible with essentially complete response to therapy (Deauville 1). 2. Generalized reduced activity in the L4 vertebral body and the type of finding which  would typically reflect prior radiation therapy. 3. Stable septated fatty right pelvic lesion, possibly a dermoid or lipoma, not hypermetabolic   7/51/0258 PET scan   Hypermetabolic lesion along the  L3 vertebral body and left posterior elements, max SUV 7.8 (Deauville 5). Hypermetabolic lesion along the left inferior pubic ramus, max SUV 4.9 (Deauville 4).  IMPRESSION: Prevascular lymphadenopathy, reflecting nodal recurrence (Deauville 4). Hypermetabolic osseous metastases involving the L3 vertebral body/posterior elements and left inferior pubic ramus (Deauville 4-5). Hypermetabolism along the endometrium, new, possibly reactive/physiologic. Consider pelvic ultrasound and/or endometrial sampling as clinically warranted.   11/04/2016 - 02/24/2017 Chemotherapy   She received Brentuximab   12/11/2016 PET scan   1. Mixed response to therapy within the skeleton. Lesions at L3 is decreased in size and metabolic activity. Residual activity remains above liver ( Deauville 4) 2. Increased activity in the LEFT sacrum with metabolic activity above liver activity ( Deauville 4 3. Decrease in size and metabolic activity of anterior mediastinal tissue consistent with resolution of thymic hyperplasia or resolution of lymphoma metabolic activity ( Deauville 2). 4. No new lymphadenopathy. Normal spleen and liver.   03/25/2017 PET scan   1. Increased metabolic activity in several normal sized retroperitoneal and right pelvic lymph nodes, Deauville 4. 2. Increase in size and metabolic activity of lesions in the L3 and L4 vertebral bodies and in the left sacrum, Deauville 5. 3. No recurrence of anterior mediastinal abnormal activity. 4. Other imaging findings of potential clinical significance: Chronic ethmoid and left maxillary sinusitis. Paraseptal emphysema at the lung apices. Right ovarian dermoid.   04/06/2017 -  Chemotherapy   The patient had Keytruda   04/06/2017 - 07/17/2019 Chemotherapy   The patient had pembrolizumab (KEYTRUDA) 200 mg in sodium chloride 0.9 % 50 mL chemo infusion, 200 mg, Intravenous, Once, 30 of 31 cycles Administration: 200 mg (04/06/2017), 200 mg (04/29/2017), 200 mg  (05/20/2017), 200 mg (06/10/2017), 200 mg (07/01/2017), 200 mg (07/22/2017), 200 mg (08/12/2017), 200 mg (09/02/2017), 200 mg (10/04/2017), 200 mg (11/03/2017), 200 mg (12/02/2017), 200 mg (12/30/2017), 200 mg (01/27/2018), 200 mg (02/24/2018), 200 mg (03/22/2018), 200 mg (04/19/2018), 200 mg (05/17/2018), 200 mg (06/14/2018), 200 mg (07/12/2018), 200 mg (08/09/2018), 200 mg (09/06/2018), 200 mg (10/04/2018), 200 mg (11/01/2018), 200 mg (11/29/2018), 200 mg (12/27/2018), 200 mg (01/24/2019), 200 mg (03/14/2019), 200 mg (05/23/2019)  for chemotherapy treatment.    06/09/2017 PET scan   1. Overall significant improvement with reduction in nodal activity. Several retroperitoneal nodes which were previously Deauville 4 are currently Deauville 3. The right common iliac lymph node remains at Deauville 4 with maximum SUV of 3.4, but is improved from prior SUV of 4.3. 2. The bony lesions show the greatest improvement, with the previously Deauville 5 hypermetabolic lesions currently no longer of higher metabolic activity than surrounding bone marrow, currently measuring at Deauville 3. 3. 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 4. No new lesions are identified. 5. Emphysema (ICD10-J43.9).   08/05/2017 Imaging   1. Somewhat complex left adnexal lesion, potentially hemorrhagic cyst measuring 2.9 x 2.8 cm. The possibility of torsion in the left ovary must be considered. Advise correlation with pelvic ultrasound including Doppler assessment to further evaluate.  2.  Right ovarian dermoid, unchanged from recent PET-CT examination.  3. No adenopathy by size criteria evident. Appearance of subcentimeter mesenteric lymph nodes is stable compared to recent study.  4. No bone lesions appreciable by CT. Areas of abnormal radiotracer uptake in the mid lumbar spine noted on recent  PET-CT examination. No destruction 2 or lytic change noted in the lumbar vertebrae currently there.  5. No bowel  obstruction. No abscess. No periappendiceal region inflammation.  6.  Small hiatal hernia with fluid in distal esophagus.  7.  Foci of coronary artery calcification, advanced for age.  8.  Spleen normal in size and contour.  9. Small hiatal hernia with mild fluid in the distal esophagus, likely indicative of a degree of reflux.   01/04/2018 Imaging   1. No findings to suggest residual/recurrent lymphoma in the chest, abdomen or pelvis. 2. Right ovarian dermoid slightly larger than prior examinations, currently measuring 3.8 x 2.8 x 3.1 cm. 3. Additional incidental findings, as above.   07/11/2018 Imaging   No findings suspicious for recurrent lymphoma. Spleen is normal in size.  Stable right ovarian dermoid.  4 mm subpleural nodular opacity in the posterior right lower lobe, favoring subpleural atelectasis.   07/17/2019 Imaging   1. Stable exam. No new or progressive findings to suggest recurrent disease. 2. Stable right adnexal dermoid. 3. Emphysema (ICD10-J43.9).     REVIEW OF SYSTEMS:   Constitutional: Denies fevers, chills or abnormal weight loss Eyes: Denies blurriness of vision Ears, nose, mouth, throat, and face: Denies mucositis or sore throat Respiratory: Denies cough, dyspnea or wheezes Cardiovascular: Denies palpitation, chest discomfort or lower extremity swelling Gastrointestinal:  Denies nausea, heartburn or change in bowel habits Skin: Denies abnormal skin rashes Lymphatics: Denies new lymphadenopathy or easy bruising Neurological:Denies numbness, tingling or new weaknesses Behavioral/Psych: Mood is stable, no new changes  All other systems were reviewed with the patient and are negative.  I have reviewed the past medical history, past surgical history, social history and family history with the patient and they are unchanged from previous note.  ALLERGIES:  is allergic to temazepam.  MEDICATIONS:  Current Outpatient Medications  Medication Sig  Dispense Refill  . atazanavir (REYATAZ) 300 MG capsule TAKE ONE CAPSULE BY MOUTH DAILY WITH BREAKFAST. TAKE WITH NORVIR 90 capsule 1  . cholecalciferol (VITAMIN D) 1000 units tablet Take 1 tablet (1,000 Units total) by mouth daily. 30 tablet 9  . Clotrimazole 1 % OINT Apply 1 application topically 2 (two) times daily. 56.7 g 1  . emtricitabine-tenofovir (TRUVADA) 200-300 MG tablet Take 1 tablet by mouth daily. 90 tablet 1  . hydrocortisone 1 % ointment Apply 1 application topically 2 (two) times daily. To affected area 30 g 2  . hydrocortisone 2.5 % cream APP EXT AA D    . hydrOXYzine (ATARAX/VISTARIL) 10 MG tablet Take 1 tablet (10 mg total) by mouth 3 (three) times daily as needed. 30 tablet 0  . hydrOXYzine (ATARAX/VISTARIL) 25 MG tablet Take 25 mg by mouth every 8 (eight) hours as needed.    . imiquimod (ALDARA) 5 % cream Apply topically 3 (three) times a week. 12 each 1  . ondansetron (ZOFRAN ODT) 8 MG disintegrating tablet Take 1 tablet (8 mg total) by mouth every 8 (eight) hours as needed for nausea. 30 tablet 11  . ritonavir (NORVIR) 100 MG TABS tablet TAKE 1 TABLET BY MOUTH EVERY MORNING WITH BREAKFAST. TAKE WITH REYATAZ 90 tablet 1  . traMADol (ULTRAM) 50 MG tablet TAKE 2 TABLETS(100 MG) BY MOUTH EVERY 6 HOURS AS NEEDED FOR MODERATE PAIN 90 tablet 0   No current facility-administered medications for this visit.    PHYSICAL EXAMINATION: ECOG PERFORMANCE STATUS: 0 - Asymptomatic  Vitals:   07/18/19 0815  BP: 117/79  Pulse: 66  Resp: 18  Temp: 98.7 F (37.1 C)   Filed Weights   07/18/19 0815  Weight: 105 lb 9.6 oz (47.9 kg)    GENERAL:alert, no distress and comfortable NEURO: alert & oriented x 3 with fluent speech, no focal motor/sensory deficits  LABORATORY DATA:  I have reviewed the data as listed    Component Value Date/Time   NA 140 07/18/2019 0742   NA 140 04/29/2017 0745   K 3.4 (L) 07/18/2019 0742   K 3.3 (L) 04/29/2017 0745   CL 106 07/18/2019 0742   CO2  23 07/18/2019 0742   CO2 23 04/29/2017 0745   GLUCOSE 92 07/18/2019 0742   GLUCOSE 83 04/29/2017 0745   BUN 11 07/18/2019 0742   BUN 10.7 04/29/2017 0745   CREATININE 1.05 (H) 07/18/2019 0742   CREATININE 1.2 (H) 04/29/2017 0745   CALCIUM 9.2 07/18/2019 0742   CALCIUM 9.8 04/29/2017 0745   PROT 6.7 07/18/2019 0742   PROT 7.6 04/29/2017 0745   ALBUMIN 4.0 07/18/2019 0742   ALBUMIN 4.0 04/29/2017 0745   AST 20 07/18/2019 0742   AST 19 04/29/2017 0745   ALT 17 07/18/2019 0742   ALT 19 04/29/2017 0745   ALKPHOS 87 07/18/2019 0742   ALKPHOS 81 04/29/2017 0745   BILITOT 1.4 (H) 07/18/2019 0742   BILITOT 1.31 (H) 04/29/2017 0745   GFRNONAA >60 07/18/2019 0742   GFRNONAA 83 02/20/2016 0908   GFRAA >60 07/18/2019 0742   GFRAA >89 02/20/2016 0908    No results found for: SPEP, UPEP  Lab Results  Component Value Date   WBC 5.8 07/18/2019   NEUTROABS 3.3 07/18/2019   HGB 13.7 07/18/2019   HCT 38.5 07/18/2019   MCV 97.0 07/18/2019   PLT 256 07/18/2019      Chemistry      Component Value Date/Time   NA 140 07/18/2019 0742   NA 140 04/29/2017 0745   K 3.4 (L) 07/18/2019 0742   K 3.3 (L) 04/29/2017 0745   CL 106 07/18/2019 0742   CO2 23 07/18/2019 0742   CO2 23 04/29/2017 0745   BUN 11 07/18/2019 0742   BUN 10.7 04/29/2017 0745   CREATININE 1.05 (H) 07/18/2019 0742   CREATININE 1.2 (H) 04/29/2017 0745      Component Value Date/Time   CALCIUM 9.2 07/18/2019 0742   CALCIUM 9.8 04/29/2017 0745   ALKPHOS 87 07/18/2019 0742   ALKPHOS 81 04/29/2017 0745   AST 20 07/18/2019 0742   AST 19 04/29/2017 0745   ALT 17 07/18/2019 0742   ALT 19 04/29/2017 0745   BILITOT 1.4 (H) 07/18/2019 0742   BILITOT 1.31 (H) 04/29/2017 0745       RADIOGRAPHIC STUDIES: I have personally reviewed the radiological images as listed and agreed with the findings in the report. CT CHEST W CONTRAST  Result Date: 07/17/2019 CLINICAL DATA:  Hodgkin's lymphoma. Restaging. EXAM: CT CHEST, ABDOMEN,  AND PELVIS WITH CONTRAST TECHNIQUE: Multidetector CT imaging of the chest, abdomen and pelvis was performed following the standard protocol during bolus administration of intravenous contrast. CONTRAST:  141m OMNIPAQUE IOHEXOL 300 MG/ML  SOLN COMPARISON:  07/11/2018 FINDINGS: CT CHEST FINDINGS Cardiovascular: The heart size is normal. No substantial pericardial effusion. No thoracic aortic aneurysm. Right Port-A-Cath tip is positioned at the SVC/RA junction. Mediastinum/Nodes: Stable appearance of triangle-shaped soft tissue in the anterior mediastinum, likely thymic remnant. No mediastinal lymphadenopathy. There is no hilar lymphadenopathy. The esophagus has normal imaging features. There is no axillary lymphadenopathy. Lungs/Pleura: Paraseptal emphysema evident. Stable 4  mm posterior right lower lobe subpleural nodule (76/6). Tiny subpleural right middle lobe nodule (73/6) also unchanged. No suspicious pulmonary nodule or mass. No focal airspace consolidation. No pleural effusion. Musculoskeletal: No worrisome lytic or sclerotic osseous abnormality. CT ABDOMEN PELVIS FINDINGS Hepatobiliary: No suspicious focal abnormality within the liver parenchyma. Liver measures 17.8 cm craniocaudal length, mildly increased. There is no evidence for gallstones, gallbladder wall thickening, or pericholecystic fluid. No intrahepatic or extrahepatic biliary dilation. Pancreas: No focal mass lesion. No dilatation of the main duct. No intraparenchymal cyst. No peripancreatic edema. Spleen: No splenomegaly. No focal mass lesion. Adrenals/Urinary Tract: No adrenal nodule or mass. No evidence for hydroureter. The urinary bladder appears normal for the degree of distention. Stomach/Bowel: Stomach is unremarkable. No gastric wall thickening. No evidence of outlet obstruction. Duodenum is normally positioned as is the ligament of Treitz. No small bowel wall thickening. No small bowel dilatation. The terminal ileum is normal. The appendix  is not visualized, but there is no edema or inflammation in the region of the cecum. No gross colonic mass. No colonic wall thickening. Vascular/Lymphatic: No abdominal aortic aneurysm. No abdominal aortic atherosclerotic calcification. No gastrohepatic or hepatoduodenal ligament lymphadenopathy. Upper normal left para-aortic nodes visible on image 57/2 (9 mm short axis) and 62/2 (8 mm short axis) are similar to prior. No pelvic sidewall lymphadenopathy. Reproductive: The uterus is unremarkable. No left adnexal mass. Similar appearance of 3.7 x 2.3 cm fatty lesion right adnexal space compatible with dermoid. Other: No intraperitoneal free fluid. Musculoskeletal: No worrisome lytic or sclerotic osseous abnormality. IMPRESSION: 1. Stable exam. No new or progressive findings to suggest recurrent disease. 2. Stable right adnexal dermoid. 3. Emphysema (ICD10-J43.9). Electronically Signed   By: Misty Stanley M.D.   On: 07/17/2019 09:31   CT ABDOMEN PELVIS W CONTRAST  Result Date: 07/17/2019 CLINICAL DATA:  Hodgkin's lymphoma. Restaging. EXAM: CT CHEST, ABDOMEN, AND PELVIS WITH CONTRAST TECHNIQUE: Multidetector CT imaging of the chest, abdomen and pelvis was performed following the standard protocol during bolus administration of intravenous contrast. CONTRAST:  140m OMNIPAQUE IOHEXOL 300 MG/ML  SOLN COMPARISON:  07/11/2018 FINDINGS: CT CHEST FINDINGS Cardiovascular: The heart size is normal. No substantial pericardial effusion. No thoracic aortic aneurysm. Right Port-A-Cath tip is positioned at the SVC/RA junction. Mediastinum/Nodes: Stable appearance of triangle-shaped soft tissue in the anterior mediastinum, likely thymic remnant. No mediastinal lymphadenopathy. There is no hilar lymphadenopathy. The esophagus has normal imaging features. There is no axillary lymphadenopathy. Lungs/Pleura: Paraseptal emphysema evident. Stable 4 mm posterior right lower lobe subpleural nodule (76/6). Tiny subpleural right middle  lobe nodule (73/6) also unchanged. No suspicious pulmonary nodule or mass. No focal airspace consolidation. No pleural effusion. Musculoskeletal: No worrisome lytic or sclerotic osseous abnormality. CT ABDOMEN PELVIS FINDINGS Hepatobiliary: No suspicious focal abnormality within the liver parenchyma. Liver measures 17.8 cm craniocaudal length, mildly increased. There is no evidence for gallstones, gallbladder wall thickening, or pericholecystic fluid. No intrahepatic or extrahepatic biliary dilation. Pancreas: No focal mass lesion. No dilatation of the main duct. No intraparenchymal cyst. No peripancreatic edema. Spleen: No splenomegaly. No focal mass lesion. Adrenals/Urinary Tract: No adrenal nodule or mass. No evidence for hydroureter. The urinary bladder appears normal for the degree of distention. Stomach/Bowel: Stomach is unremarkable. No gastric wall thickening. No evidence of outlet obstruction. Duodenum is normally positioned as is the ligament of Treitz. No small bowel wall thickening. No small bowel dilatation. The terminal ileum is normal. The appendix is not visualized, but there is no edema or inflammation in the  region of the cecum. No gross colonic mass. No colonic wall thickening. Vascular/Lymphatic: No abdominal aortic aneurysm. No abdominal aortic atherosclerotic calcification. No gastrohepatic or hepatoduodenal ligament lymphadenopathy. Upper normal left para-aortic nodes visible on image 57/2 (9 mm short axis) and 62/2 (8 mm short axis) are similar to prior. No pelvic sidewall lymphadenopathy. Reproductive: The uterus is unremarkable. No left adnexal mass. Similar appearance of 3.7 x 2.3 cm fatty lesion right adnexal space compatible with dermoid. Other: No intraperitoneal free fluid. Musculoskeletal: No worrisome lytic or sclerotic osseous abnormality. IMPRESSION: 1. Stable exam. No new or progressive findings to suggest recurrent disease. 2. Stable right adnexal dermoid. 3. Emphysema  (ICD10-J43.9). Electronically Signed   By: Misty Stanley M.D.   On: 07/17/2019 09:31

## 2019-07-18 NOTE — Assessment & Plan Note (Signed)
She has multiple chronic joint pain and is dependent on tramadol to control her joint pain She is taking vitamin D supplement as prescribed We discussed refill policy. 

## 2019-07-18 NOTE — Assessment & Plan Note (Signed)
She is compliant taking her antiretroviral treatment Her recent follow-up with infectious disease showed undetectable viral load We discussed the importance for her to stay compliant with her antiretroviral treatment as non compliance has direct correlation with the risk of lymphoma relapse

## 2019-07-27 ENCOUNTER — Telehealth: Payer: Self-pay

## 2019-07-27 NOTE — Telephone Encounter (Signed)
Not due for refill yet

## 2019-07-27 NOTE — Telephone Encounter (Signed)
She called and left a message requesting refill on Tramadol.

## 2019-07-28 ENCOUNTER — Other Ambulatory Visit: Payer: Self-pay | Admitting: Hematology and Oncology

## 2019-07-28 DIAGNOSIS — B20 Human immunodeficiency virus [HIV] disease: Secondary | ICD-10-CM

## 2019-07-28 MED ORDER — TRAMADOL HCL 50 MG PO TABS: ORAL_TABLET | ORAL | 0 refills | Status: DC

## 2019-07-28 NOTE — Telephone Encounter (Signed)
Called and left a message. Dr. Alvy Bimler sent a refill of the Tramadol today. Ask her to call the office if needed.

## 2019-08-07 ENCOUNTER — Telehealth: Payer: Self-pay

## 2019-08-07 ENCOUNTER — Other Ambulatory Visit: Payer: Self-pay

## 2019-08-07 ENCOUNTER — Other Ambulatory Visit: Payer: Self-pay | Admitting: Hematology and Oncology

## 2019-08-07 DIAGNOSIS — B20 Human immunodeficiency virus [HIV] disease: Secondary | ICD-10-CM

## 2019-08-07 MED ORDER — TRAMADOL HCL 50 MG PO TABS: ORAL_TABLET | ORAL | 0 refills | Status: DC

## 2019-08-07 NOTE — Telephone Encounter (Signed)
RN left message notifying Rx has been sent.

## 2019-08-07 NOTE — Telephone Encounter (Signed)
Pt requesting refill on Tramadol.  Last Rx given 4/2.  Will forward to MD for review.

## 2019-08-07 NOTE — Telephone Encounter (Signed)
done

## 2019-08-08 ENCOUNTER — Telehealth: Payer: Self-pay | Admitting: *Deleted

## 2019-08-08 ENCOUNTER — Encounter: Payer: Self-pay | Admitting: Obstetrics & Gynecology

## 2019-08-08 ENCOUNTER — Ambulatory Visit (INDEPENDENT_AMBULATORY_CARE_PROVIDER_SITE_OTHER): Payer: 59 | Admitting: Obstetrics & Gynecology

## 2019-08-08 VITALS — BP 110/70

## 2019-08-08 DIAGNOSIS — N9089 Other specified noninflammatory disorders of vulva and perineum: Secondary | ICD-10-CM

## 2019-08-08 DIAGNOSIS — K620 Anal polyp: Secondary | ICD-10-CM

## 2019-08-08 DIAGNOSIS — A630 Anogenital (venereal) warts: Secondary | ICD-10-CM | POA: Diagnosis not present

## 2019-08-08 NOTE — Progress Notes (Signed)
    ONG BAES 1985-09-16 [id number]        34 y.o.  [id number]   RP: F/U Vulvar lesion   HPI: Left raised Vulvar lesion not going away with Podofilox treatment.  Bothered by the anal polyp.   OB History  Gravida Para Term Preterm AB Living  5 0 0   2 0  SAB TAB Ectopic Multiple Live Births  1   1        # Outcome Date GA Lbr Len/2nd Weight Sex Delivery Anes PTL Lv  5 Gravida           4 Ectopic 07/01/10 [redacted]w[redacted]d            Birth Comments: left ectopic; methotrexate  3 Gravida           2 Gravida              Birth Comments: System Generated. Please review and update pregnancy details.  1 SAB  [redacted]w[redacted]d            Birth Comments: d and c    Past medical history,surgical history, problem list, medications, allergies, family history and social history were all reviewed and documented in the EPIC chart.   Directed ROS with pertinent positives and negatives documented in the history of present illness/assessment and plan.  Exam:  Vitals:   08/08/19 1016  BP: 110/70   General appearance:  Normal  Gynecologic exam: Vulva:  Left raised vulvar lesion 5 mm with loss of skin pigmentation.  Excision after verbal consent:  Betadine prep, local anesthesia with Lidocaine 1%.  Excision with the scalpel.  Closure with separate stitches of Vicryl 4-0 x 4.  Good hemostasis.  No Cx.                                  Anal Polyps     Assessment/Plan:  34 y.o. [id number]   1. Vulvar lesion Excision of Left raised Vulvar lesion.  Well tolerated, no Cx.  Post procedure precautions reviewed.  Specimen sent to Pathology.  2. Anal polyp Refer to General Surgeon.  Princess Bruins MD, 10:42 AM 08/08/2019

## 2019-08-08 NOTE — Patient Instructions (Addendum)
  1. Vulvar lesion Excision of Left raised Vulvar lesion.  Well tolerated, no Cx.  Post procedure precautions reviewed.  Specimen sent to Pathology.  2. Anal polyp Refer to General Surgeon.  Kristelle, it was a pleasure seeing you today!  I will inform you of your results as soon as they are available.

## 2019-08-08 NOTE — Telephone Encounter (Signed)
Message to [facility] to referral coordinator to schedule visit.

## 2019-08-08 NOTE — Telephone Encounter (Signed)
-----   Message from Princess Bruins, MD sent at 08/08/2019 10:53 AM EDT ----- Regarding: Refer to Rectal Surgeon Anal Polyps

## 2019-08-10 ENCOUNTER — Other Ambulatory Visit: Payer: Self-pay | Admitting: Internal Medicine

## 2019-08-10 ENCOUNTER — Telehealth: Payer: Self-pay

## 2019-08-10 LAB — TISSUE SPECIMEN

## 2019-08-10 LAB — PATHOLOGY REPORT

## 2019-08-10 MED ORDER — IBUPROFEN 800 MG PO TABS: 800.0000 mg | ORAL_TABLET | Freq: Three times a day (TID) | ORAL | 2 refills | Status: DC | PRN

## 2019-08-10 NOTE — Telephone Encounter (Signed)
Ok, that has been sent in with a couple of refills.

## 2019-08-10 NOTE — Telephone Encounter (Signed)
Patient called office today requesting refills on Ibuprofen 800 mg. States this medication has not been filled in awhile, but would like pharmacy to have prescription ready when she needs it.  Does not have a PCP or other provider who can fill this. Will forward message to MD to advise if refill is okay. It not currently listed as active prescription. Uses [facility] on [address] [name]

## 2019-08-21 ENCOUNTER — Other Ambulatory Visit: Payer: Self-pay | Admitting: Hematology and Oncology

## 2019-08-21 ENCOUNTER — Telehealth: Payer: Self-pay

## 2019-08-21 DIAGNOSIS — B20 Human immunodeficiency virus [HIV] disease: Secondary | ICD-10-CM

## 2019-08-21 MED ORDER — TRAMADOL HCL 50 MG PO TABS: ORAL_TABLET | ORAL | 0 refills | Status: DC

## 2019-08-21 NOTE — Telephone Encounter (Signed)
She called and left a message requesting refill on Tramadol.

## 2019-08-21 NOTE — Telephone Encounter (Signed)
done

## 2019-08-22 NOTE — Telephone Encounter (Signed)
[facility] left message for patient to call.

## 2019-09-01 ENCOUNTER — Telehealth: Payer: Self-pay

## 2019-09-01 ENCOUNTER — Other Ambulatory Visit: Payer: Self-pay | Admitting: Hematology and Oncology

## 2019-09-01 DIAGNOSIS — B20 Human immunodeficiency virus [HIV] disease: Secondary | ICD-10-CM

## 2019-09-01 MED ORDER — TRAMADOL HCL 50 MG PO TABS: ORAL_TABLET | ORAL | 0 refills | Status: DC

## 2019-09-01 NOTE — Telephone Encounter (Signed)
She called and requested a refill on Tramadol Rx. 

## 2019-09-01 NOTE — Telephone Encounter (Signed)
Called and given below message. She verbalized understanding. 

## 2019-09-01 NOTE — Telephone Encounter (Signed)
done

## 2019-09-04 NOTE — Telephone Encounter (Signed)
Jordon from [facility] said patient said she will call back to schedule appointment. Encounter will be closed now since patient is aware of referral.

## 2019-09-12 ENCOUNTER — Other Ambulatory Visit: Payer: Self-pay | Admitting: Hematology and Oncology

## 2019-09-12 ENCOUNTER — Inpatient Hospital Stay: Payer: 59 | Attending: Hematology and Oncology

## 2019-09-12 ENCOUNTER — Telehealth: Payer: Self-pay

## 2019-09-12 ENCOUNTER — Other Ambulatory Visit: Payer: Self-pay

## 2019-09-12 DIAGNOSIS — Z95828 Presence of other vascular implants and grafts: Secondary | ICD-10-CM

## 2019-09-12 DIAGNOSIS — C8118 Nodular sclerosis classical Hodgkin lymphoma, lymph nodes of multiple sites: Secondary | ICD-10-CM

## 2019-09-12 DIAGNOSIS — Z452 Encounter for adjustment and management of vascular access device: Secondary | ICD-10-CM | POA: Insufficient documentation

## 2019-09-12 MED ORDER — HEPARIN SOD (PORK) LOCK FLUSH 100 UNIT/ML IV SOLN
500.0000 [IU] | Freq: Once | INTRAVENOUS | Status: AC
  Administered 2019-09-12: 500 [IU]
  Filled 2019-09-12: qty 5

## 2019-09-12 MED ORDER — SODIUM CHLORIDE 0.9% FLUSH
10.0000 mL | Freq: Once | INTRAVENOUS | Status: AC
  Administered 2019-09-12: 10 mL
  Filled 2019-09-12: qty 10

## 2019-09-12 NOTE — Telephone Encounter (Signed)
She left a message requesting that her port be removed. She had port flush today.

## 2019-09-12 NOTE — Telephone Encounter (Signed)
I sent order and remove her port flush appt next visit

## 2019-09-12 NOTE — Telephone Encounter (Signed)
Called and given below message. She verbalized understanding. Flush appts canceled.

## 2019-09-13 ENCOUNTER — Other Ambulatory Visit: Payer: Self-pay | Admitting: Hematology and Oncology

## 2019-09-13 DIAGNOSIS — B20 Human immunodeficiency virus [HIV] disease: Secondary | ICD-10-CM

## 2019-09-13 MED ORDER — TRAMADOL HCL 50 MG PO TABS: ORAL_TABLET | ORAL | 0 refills | Status: DC

## 2019-09-18 ENCOUNTER — Other Ambulatory Visit: Payer: Self-pay | Admitting: Student

## 2019-09-19 ENCOUNTER — Ambulatory Visit (HOSPITAL_COMMUNITY)
Admission: RE | Admit: 2019-09-19 | Discharge: 2019-09-19 | Disposition: A | Discharge status: Home / Self Care | Payer: 59 | Source: Ambulatory Visit | Attending: Hematology and Oncology | Admitting: Hematology and Oncology

## 2019-09-19 ENCOUNTER — Encounter (HOSPITAL_COMMUNITY): Payer: Self-pay

## 2019-09-19 ENCOUNTER — Other Ambulatory Visit: Payer: Self-pay

## 2019-09-19 DIAGNOSIS — Z801 Family history of malignant neoplasm of trachea, bronchus and lung: Secondary | ICD-10-CM | POA: Diagnosis not present

## 2019-09-19 DIAGNOSIS — Z79899 Other long term (current) drug therapy: Secondary | ICD-10-CM | POA: Insufficient documentation

## 2019-09-19 DIAGNOSIS — Z809 Family history of malignant neoplasm, unspecified: Secondary | ICD-10-CM | POA: Insufficient documentation

## 2019-09-19 DIAGNOSIS — F17210 Nicotine dependence, cigarettes, uncomplicated: Secondary | ICD-10-CM | POA: Insufficient documentation

## 2019-09-19 DIAGNOSIS — Z8619 Personal history of other infectious and parasitic diseases: Secondary | ICD-10-CM | POA: Diagnosis not present

## 2019-09-19 DIAGNOSIS — Z21 Asymptomatic human immunodeficiency virus [HIV] infection status: Secondary | ICD-10-CM | POA: Diagnosis not present

## 2019-09-19 DIAGNOSIS — C8118 Nodular sclerosis classical Hodgkin lymphoma, lymph nodes of multiple sites: Secondary | ICD-10-CM | POA: Diagnosis not present

## 2019-09-19 DIAGNOSIS — Z888 Allergy status to other drugs, medicaments and biological substances status: Secondary | ICD-10-CM | POA: Diagnosis not present

## 2019-09-19 DIAGNOSIS — Z452 Encounter for adjustment and management of vascular access device: Secondary | ICD-10-CM | POA: Diagnosis not present

## 2019-09-19 HISTORY — PX: IR REMOVAL TUN ACCESS W/ PORT W/O FL MOD SED: IMG2290

## 2019-09-19 LAB — CBC
HCT: 41.8 % (ref 36.0–46.0)
Hemoglobin: 14.4 g/dL (ref 12.0–15.0)
MCH: 34 pg (ref 26.0–34.0)
MCHC: 34.4 g/dL (ref 30.0–36.0)
MCV: 98.8 fL (ref 80.0–100.0)
Platelets: 270 10*3/uL (ref 150–400)
RBC: 4.23 MIL/uL (ref 3.87–5.11)
RDW: 12.3 % (ref 11.5–15.5)
WBC: 6.9 10*3/uL (ref 4.0–10.5)
nRBC: 0 % (ref 0.0–0.2)

## 2019-09-19 LAB — PROTIME-INR
INR: 1 (ref 0.8–1.2)
Prothrombin Time: 12.9 seconds (ref 11.4–15.2)

## 2019-09-19 MED ORDER — FENTANYL CITRATE (PF) 100 MCG/2ML IJ SOLN
INTRAMUSCULAR | Status: AC
  Filled 2019-09-19: qty 2

## 2019-09-19 MED ORDER — SODIUM CHLORIDE 0.9 % IV SOLN: INTRAVENOUS | Status: DC

## 2019-09-19 MED ORDER — MIDAZOLAM HCL 2 MG/2ML IJ SOLN
INTRAMUSCULAR | Status: AC
  Filled 2019-09-19: qty 4

## 2019-09-19 MED ORDER — CEFAZOLIN SODIUM-DEXTROSE 2-4 GM/100ML-% IV SOLN: 2.0000 g | INTRAVENOUS | Status: AC

## 2019-09-19 MED ORDER — LIDOCAINE-EPINEPHRINE 1 %-1:100000 IJ SOLN
INTRAMUSCULAR | Status: AC
  Filled 2019-09-19: qty 1

## 2019-09-19 MED ORDER — MIDAZOLAM HCL 2 MG/2ML IJ SOLN
INTRAMUSCULAR | Status: AC | PRN
  Administered 2019-09-19: 2 mg via INTRAVENOUS
  Administered 2019-09-19: 1 mg via INTRAVENOUS

## 2019-09-19 MED ORDER — CEFAZOLIN SODIUM-DEXTROSE 2-4 GM/100ML-% IV SOLN
INTRAVENOUS | Status: AC
  Administered 2019-09-19: 2 g via INTRAVENOUS
  Filled 2019-09-19: qty 100

## 2019-09-19 MED ORDER — LIDOCAINE-EPINEPHRINE 1 %-1:100000 IJ SOLN
INTRAMUSCULAR | Status: AC | PRN
  Administered 2019-09-19: 10 mL via INTRADERMAL

## 2019-09-19 MED ORDER — FENTANYL CITRATE (PF) 100 MCG/2ML IJ SOLN
INTRAMUSCULAR | Status: AC | PRN
  Administered 2019-09-19 (×2): 50 ug via INTRAVENOUS

## 2019-09-19 NOTE — H&P (Signed)
Chief Complaint: Patient was seen in consultation today for removal of her tunneled central venous catheter with port.   Referring Physician(s): Heath Lark  Supervising Physician: Jacqulynn Cadet  Patient Status: [facility] - Out-pt  History of Present Illness: [name] is a 34 y.o. female with a medical history of depression, HSV, HIV and Hodgkin Lymphoma. She has been seen by the Interventional Radiology team several times: cervical lymph node biopsy 2016, right ilium lesion biopsy 2017 and a para-aortic lymph node biopsy 2018. She had a port placement in 07/2014 (removed 07/2015) and another port placed 04/2016.   She has completed her chemotherapy and Interventional Radiology has been asked to evaluate this patient for the removal of her current port.    Past Medical History:  Diagnosis Date  . AIN III (anal intraepithelial neoplasia III)   . Anemia   . Cancer ([place])    Hodgkin lymphoma  . Chest wall pain 06/27/2015  . Condyloma acuminatum in female   . Depression   . History of chronic bronchitis   . History of esophagitis    CANDIDA  . History of shingles   . HIV (human immunodeficiency virus infection) ([place])   . Hodgkin's lymphoma ([place]) 06/12/2014  . HSV (herpes simplex virus) infection   . Hypokalemia 07/17/2014  . Periodontitis, chronic   . Screening examination for venereal disease 10/30/2013    Past Surgical History:  Procedure Laterality Date  . DIAGNOSTIC LAPAROSCOPY WITH REMOVAL OF ECTOPIC PREGNANCY N/A 11/17/2015   Procedure: LAPAROSCOPY LEFT  SALPINGECTOMY SECONDARY TO LEFT ECTOPIC PREGNANCY;  Surgeon: Jonnie Kind, MD;  Location: [facility];  Service: Gynecology;  Laterality: N/A;  . DILATION AND CURETTAGE OF UTERUS  2005   MISSED AB  . EXAMINATION UNDER ANESTHESIA N/A 09/23/2012   Procedure: EXAM UNDER ANESTHESIA;  Surgeon: Adin Hector, MD;  Location: [facility] - [facility];  Service: General;  Laterality: N/A;  . IR GENERIC HISTORICAL  05/26/2016   IR  FLUORO GUIDE PORT INSERTION RIGHT 05/26/2016 [facility]-INTERV RAD  . IR GENERIC HISTORICAL  05/26/2016   IR US GUIDE VASC ACCESS RIGHT 05/26/2016 [facility]  . LASER ABLATION CONDOLAMATA N/A 09/23/2012   Procedure: REMOVAL/ABLATION  ABLATION CONDOLAMATA WARTS;  Surgeon: Adin Hector, MD;  Location: [facility];  Service: General;  Laterality: N/A;    Allergies: Temazepam  Medications: Prior to Admission medications   Medication Sig Start Date End Date Taking? Authorizing Provider  atazanavir (REYATAZ) 300 MG capsule TAKE ONE CAPSULE BY MOUTH DAILY WITH BREAKFAST. TAKE WITH NORVIR 06/20/19  Yes Comer, Okey Regal, MD  cholecalciferol (VITAMIN D) 1000 units tablet Take 1 tablet (1,000 Units total) by mouth daily. 06/05/16  Yes Gorsuch, Ni, MD  emtricitabine-tenofovir (TRUVADA) 200-300 MG tablet Take 1 tablet by mouth daily. 06/20/19  Yes Comer, Okey Regal, MD  hydrocortisone 1 % ointment Apply 1 application topically 2 (two) times daily. To affected area 10/25/18  Yes Comer, Okey Regal, MD  hydrocortisone 2.5 % cream APP EXT AA D 03/03/19  Yes [provider]  hydrOXYzine (ATARAX/VISTARIL) 10 MG tablet Take 1 tablet (10 mg total) by mouth 3 (three) times daily as needed. 06/04/19  Yes Tish Men, MD  hydrOXYzine (ATARAX/VISTARIL) 25 MG tablet Take 25 mg by mouth every 8 (eight) hours as needed. 06/01/19  Yes [provider]  imiquimod (ALDARA) 5 % cream Apply topically 3 (three) times a week. 06/30/19  Yes Princess Bruins, MD  ondansetron (ZOFRAN ODT) 8 MG disintegrating tablet Take  1 tablet (8 mg total) by mouth every 8 (eight) hours as needed for nausea. 12/28/18  Yes Gorsuch, Ni, MD  ritonavir (NORVIR) 100 MG TABS tablet TAKE 1 TABLET BY MOUTH EVERY MORNING WITH BREAKFAST. TAKE WITH REYATAZ 06/20/19  Yes Comer, Okey Regal, MD  traMADol (ULTRAM) 50 MG tablet TAKE 2 TABLETS(100 MG) BY MOUTH EVERY 6 HOURS AS NEEDED FOR MODERATE PAIN 09/13/19  Yes Gorsuch, Ni, MD  Clotrimazole 1 % OINT  Apply 1 application topically 2 (two) times daily. 05/31/18   Thayer Headings, MD  ibuprofen (ADVIL) 800 MG tablet Take 1 tablet (800 mg total) by mouth every 8 (eight) hours as needed for moderate pain. 08/10/19   Thayer Headings, MD     Family History  Problem Relation Age of Onset  . Cancer Maternal Aunt        lung  . Cancer Maternal Grandmother        unknown ca    Social History   Socioeconomic History  . Marital status: Single    Spouse name: Not on file  . Number of children: Not on file  . Years of education: Not on file  . Highest education level: Not on file  Occupational History  . Not on file  Tobacco Use  . Smoking status: Heavy Tobacco Smoker    Packs/day: 0.50    Years: 7.00    Pack years: 3.50    Types: Cigars, Cigarettes    Start date: 03/19/2014  . Smokeless tobacco: Never Used  . Tobacco comment: she smokes 3 Black and Mild Cigars daily  Substance and Sexual Activity  . Alcohol use: Not Currently    Alcohol/week: 0.0 standard drinks    Comment: Occasionally  . Drug use: Yes    Types: Marijuana    Comment: 2 blunts per day  . Sexual activity: Yes    Partners: Male    Birth control/protection: None    Comment: 1ST intercourse- 17, partners- 50,   Other Topics Concern  . Not on file  Social History Narrative  . Not on file   Social Determinants of Health   Financial Resource Strain:   . Difficulty of Paying Living Expenses:   Food Insecurity:   . Worried About Charity fundraiser in the Last Year:   . Arboriculturist in the Last Year:   Transportation Needs:   . Film/video editor (Medical):   Marland Kitchen Lack of Transportation (Non-Medical):   Physical Activity:   . Days of Exercise per Week:   . Minutes of Exercise per Session:   Stress:   . Feeling of Stress :   Social Connections:   . Frequency of Communication with Friends and Family:   . Frequency of Social Gatherings with Friends and Family:   . Attends Religious Services:   . Active  Member of Clubs or Organizations:   . Attends Archivist Meetings:   Marland Kitchen Marital Status:     Review of Systems: A 12 point ROS discussed and pertinent positives are indicated in the HPI above.  All other systems are negative.  Review of Systems  Constitutional: Negative for activity change and fatigue.  Respiratory: Negative for cough and shortness of breath.   Cardiovascular: Negative for chest pain and leg swelling.  Gastrointestinal: Negative for abdominal distention, abdominal pain, diarrhea, nausea and vomiting.  Musculoskeletal: Negative for back pain.  Neurological: Negative for headaches.    Vital Signs: Ht 5\' 4"  (1.626 m)  Wt 110 lb (49.9 kg)   LMP 09/15/2019   BMI 18.88 kg/m   Temp: 98.1, BP 120/74, HR 58, 94% on room air.   Physical Exam Constitutional:      General: She is not in acute distress. HENT:     Mouth/Throat:     Mouth: Mucous membranes are moist.  Cardiovascular:     Rate and Rhythm: Normal rate and regular rhythm.     Pulses: Normal pulses.     Heart sounds: Normal heart sounds.  Pulmonary:     Effort: Pulmonary effort is normal.  Abdominal:     General: Bowel sounds are normal.     Palpations: Abdomen is soft.     Tenderness: There is no abdominal tenderness.  Skin:    General: Skin is warm and dry.     Comments: Right upper chest port.   Neurological:     Mental Status: She is alert and oriented to person, place, and time.  Psychiatric:        Mood and Affect: Mood normal.        Behavior: Behavior normal.        Thought Content: Thought content normal.        Judgment: Judgment normal.     Imaging: No results found.  Labs:  CBC: Recent Labs    03/14/19 0922 05/23/19 0902 07/18/19 0742 09/19/19 1153  WBC 7.3 8.4 5.8 6.9  HGB 12.9 14.3 13.7 14.4  HCT 36.6 40.1 38.5 41.8  PLT 250 259 256 270    COAGS: No results for input(s): INR, APTT in the last 8760 hours.  BMP: Recent Labs    01/24/19 0925 03/14/19 0922  05/23/19 0902 07/18/19 0742  NA 141 140 140 140  K 3.7 4.1 3.8 3.4*  CL 109 106 107 106  CO2 23 23 24 23   GLUCOSE 87 81 80 92  BUN 14 11 11 11   CALCIUM 9.5 9.3 9.3 9.2  CREATININE 1.00 1.07* 0.96 1.05*  GFRNONAA >60 >60 >60 >60  GFRAA >60 >60 >60 >60    LIVER FUNCTION TESTS: Recent Labs    01/24/19 0925 03/14/19 0922 05/23/19 0902 07/18/19 0742  BILITOT 0.8 1.3* 1.2 1.4*  AST 15 16 16 20   ALT 14 15 11 17   ALKPHOS 89 97 107 87  PROT 6.7 6.8 7.0 6.7  ALBUMIN 4.1 4.1 4.2 4.0    TUMOR MARKERS: No results for input(s): AFPTM, CEA, CA199, CHROMGRNA in the last 8760 hours.  Assessment and Plan: [name] is a 34 y.o. female with a medical history of Hodgkin Lymphoma. She has been seen by the Interventional Radiology team multiple times since 2016 - several biopsies, a port placement in 07/2014 (removed 07/2015) and another port placed 04/2016. She has completed her chemotherapy.   She presents today to the [facility] Interventional Radiology department. Dr. Laurence Ferrari will remove her tunneled central venous catheter with port. The patient will receive sedation for this procedure.   Risks and benefits of tunneled catheter removal were discussed with the patient including bleeding, infection, damage to adjacent structures, malfunction of the catheter with need for additional procedures.  All of the patient's questions were answered, patient is agreeable to proceed. Consent signed and in chart.  Thank you for this interesting consult.  I greatly enjoyed meeting [name] and look forward to participating in their care.  A copy of this report was sent to the requesting provider on this date.  Electronically Signed: Theresa Duty,  NP 09/19/2019, 1:00 PM   I spent a total of  15 Minutes   in face to face in clinical consultation, greater than 50% of which was counseling/coordinating care for removal of tunneled central venous catheter with port.

## 2019-09-19 NOTE — Discharge Instructions (Signed)
Please call Interventional Radiology clinic [phone] with any questions or concerns.  You may remove your dressing and shower tomorrow.   Moderate Conscious Sedation, Adult, Care After These instructions provide you with information about caring for yourself after your procedure. Your health care provider may also give you more specific instructions. Your treatment has been planned according to current medical practices, but problems sometimes occur. Call your health care provider if you have any problems or questions after your procedure. What can I expect after the procedure? After your procedure, it is common:  To feel sleepy for several hours.  To feel clumsy and have poor balance for several hours.  To have poor judgment for several hours.  To vomit if you eat too soon. Follow these instructions at home: For at least 24 hours after the procedure:   Do not: ? Participate in activities where you could fall or become injured. ? Drive. ? Use heavy machinery. ? Drink alcohol. ? Take sleeping pills or medicines that cause drowsiness. ? Make important decisions or sign legal documents. ? Take care of children on your own.  Rest. Eating and drinking  Follow the diet recommended by your health care provider.  If you vomit: ? Drink water, juice, or soup when you can drink without vomiting. ? Make sure you have little or no nausea before eating solid foods. General instructions  Have a responsible adult stay with you until you are awake and alert.  Take over-the-counter and prescription medicines only as told by your health care provider.  If you smoke, do not smoke without supervision.  Keep all follow-up visits as told by your health care provider. This is important. Contact a health care provider if:  You keep feeling nauseous or you keep vomiting.  You feel light-headed.  You develop a rash.  You have a fever. Get help right away if:  You have trouble  breathing. This information is not intended to replace advice given to you by your health care provider. Make sure you discuss any questions you have with your health care provider. Document Revised: 03/26/2017 Document Reviewed: 08/03/2015 Elsevier Patient Education  2020 Elsevier Inc.   Implanted Port Removal, Care After This sheet gives you information about how to care for yourself after your procedure. Your health care provider may also give you more specific instructions. If you have problems or questions, contact your health care provider. What can I expect after the procedure? After the procedure, it is common to have:  Soreness or pain near your incision.  Some swelling or bruising near your incision. Follow these instructions at home: Medicines  Take over-the-counter and prescription medicines only as told by your health care provider.  If you were prescribed an antibiotic medicine, take it as told by your health care provider. Do not stop taking the antibiotic even if you start to feel better. Bathing  Do not take baths, swim, or use a hot tub until your health care provider approves. Ask your health care provider if you can take showers. You may only be allowed to take sponge baths. Incision care   Follow instructions from your health care provider about how to take care of your incision. Make sure you: ? Wash your hands with soap and water before you change your bandage (dressing). If soap and water are not available, use hand sanitizer. ? Change your dressing as told by your health care provider. ? Keep your dressing dry. ? Leave stitches (sutures), skin glue, or adhesive   strips in place. These skin closures may need to stay in place for 2 weeks or longer. If adhesive strip edges start to loosen and curl up, you may trim the loose edges. Do not remove adhesive strips completely unless your health care provider tells you to do that.  Check your incision area every day  for signs of infection. Check for: ? More redness, swelling, or pain. ? More fluid or blood. ? Warmth. ? Pus or a bad smell. Driving   Do not drive for 24 hours if you were given a medicine to help you relax (sedative) during your procedure.  If you did not receive a sedative, ask your health care provider when it is safe to drive. Activity  Return to your normal activities as told by your health care provider. Ask your health care provider what activities are safe for you.  Do not lift anything that is heavier than 10 lb (4.5 kg), or the limit that you are told, until your health care provider says that it is safe.  Do not do activities that involve lifting your arms over your head. General instructions  Do not use any products that contain nicotine or tobacco, such as cigarettes and e-cigarettes. These can delay healing. If you need help quitting, ask your health care provider.  Keep all follow-up visits as told by your health care provider. This is important. Contact a health care provider if:  You have more redness, swelling, or pain around your incision.  You have more fluid or blood coming from your incision.  Your incision feels warm to the touch.  You have pus or a bad smell coming from your incision.  You have pain that is not relieved by your pain medicine. Get help right away if you have:  A fever or chills.  Chest pain.  Difficulty breathing. Summary  After the procedure, it is common to have pain, soreness, swelling, or bruising near your incision.  If you were prescribed an antibiotic medicine, take it as told by your health care provider. Do not stop taking the antibiotic even if you start to feel better.  Do not drive for 24 hours if you were given a sedative during your procedure.  Return to your normal activities as told by your health care provider. Ask your health care provider what activities are safe for you. This information is not intended to  replace advice given to you by your health care provider. Make sure you discuss any questions you have with your health care provider. Document Revised: 05/27/2017 Document Reviewed: 05/27/2017 Elsevier Patient Education  2020 Elsevier Inc.   

## 2019-09-26 ENCOUNTER — Telehealth: Payer: Self-pay

## 2019-09-26 ENCOUNTER — Other Ambulatory Visit: Payer: Self-pay | Admitting: Hematology and Oncology

## 2019-09-26 DIAGNOSIS — B20 Human immunodeficiency virus [HIV] disease: Secondary | ICD-10-CM

## 2019-09-26 MED ORDER — TRAMADOL HCL 50 MG PO TABS: ORAL_TABLET | ORAL | 0 refills | Status: DC

## 2019-09-26 NOTE — Telephone Encounter (Signed)
Pt called and LVM requesting refill on Tramadol. Called pt to inform her per Alvy Bimler that Tramadol was refilled. Pt did not answer. LVM informing her and to return call to [phone] if she has questions.

## 2019-10-03 ENCOUNTER — Encounter (HOSPITAL_COMMUNITY): Payer: Self-pay | Admitting: Emergency Medicine

## 2019-10-03 ENCOUNTER — Emergency Department (HOSPITAL_COMMUNITY): Payer: 59

## 2019-10-03 ENCOUNTER — Other Ambulatory Visit: Payer: Self-pay

## 2019-10-03 ENCOUNTER — Encounter (HOSPITAL_COMMUNITY): Payer: Self-pay

## 2019-10-03 ENCOUNTER — Ambulatory Visit (INDEPENDENT_AMBULATORY_CARE_PROVIDER_SITE_OTHER)
Admission: EM | Admit: 2019-10-03 | Discharge: 2019-10-03 | Disposition: A | Discharge status: Home / Self Care | Payer: 59 | Source: Home / Self Care

## 2019-10-03 ENCOUNTER — Emergency Department (HOSPITAL_COMMUNITY)
Admission: EM | Admit: 2019-10-03 | Discharge: 2019-10-03 | Disposition: A | Discharge status: Home / Self Care | Payer: 59 | Attending: Emergency Medicine | Admitting: Emergency Medicine

## 2019-10-03 DIAGNOSIS — R0789 Other chest pain: Secondary | ICD-10-CM

## 2019-10-03 DIAGNOSIS — H538 Other visual disturbances: Secondary | ICD-10-CM | POA: Diagnosis not present

## 2019-10-03 DIAGNOSIS — M79604 Pain in right leg: Secondary | ICD-10-CM | POA: Diagnosis not present

## 2019-10-03 DIAGNOSIS — M79605 Pain in left leg: Secondary | ICD-10-CM | POA: Diagnosis not present

## 2019-10-03 DIAGNOSIS — R200 Anesthesia of skin: Secondary | ICD-10-CM

## 2019-10-03 DIAGNOSIS — M549 Dorsalgia, unspecified: Secondary | ICD-10-CM

## 2019-10-03 DIAGNOSIS — M79669 Pain in unspecified lower leg: Secondary | ICD-10-CM | POA: Diagnosis not present

## 2019-10-03 DIAGNOSIS — H5712 Ocular pain, left eye: Secondary | ICD-10-CM | POA: Diagnosis not present

## 2019-10-03 DIAGNOSIS — Z5321 Procedure and treatment not carried out due to patient leaving prior to being seen by health care provider: Secondary | ICD-10-CM | POA: Insufficient documentation

## 2019-10-03 LAB — CBC
HCT: 41.2 % (ref 36.0–46.0)
Hemoglobin: 14.1 g/dL (ref 12.0–15.0)
MCH: 34.2 pg — ABNORMAL HIGH (ref 26.0–34.0)
MCHC: 34.2 g/dL (ref 30.0–36.0)
MCV: 100 fL (ref 80.0–100.0)
Platelets: 278 10*3/uL (ref 150–400)
RBC: 4.12 MIL/uL (ref 3.87–5.11)
RDW: 12.4 % (ref 11.5–15.5)
WBC: 6.5 10*3/uL (ref 4.0–10.5)
nRBC: 0 % (ref 0.0–0.2)

## 2019-10-03 LAB — BASIC METABOLIC PANEL
Anion gap: 6 (ref 5–15)
BUN: 14 mg/dL (ref 6–20)
CO2: 27 mmol/L (ref 22–32)
Calcium: 8.8 mg/dL — ABNORMAL LOW (ref 8.9–10.3)
Chloride: 106 mmol/L (ref 98–111)
Creatinine, Ser: 1.01 mg/dL — ABNORMAL HIGH (ref 0.44–1.00)
GFR calc Af Amer: >60 mL/min (ref >60)
GFR calc non Af Amer: >60 mL/min (ref >60)
Glucose, Bld: 86 mg/dL (ref 70–99)
Potassium: 3.7 mmol/L (ref 3.5–5.1)
Sodium: 139 mmol/L (ref 135–145)

## 2019-10-03 LAB — I-STAT BETA HCG BLOOD, ED (NOT ORDERABLE): I-stat hCG, quantitative: <5 m[IU]/mL (ref <5)

## 2019-10-03 LAB — TROPONIN I (HIGH SENSITIVITY): Troponin I (High Sensitivity): <2 ng/L (ref <18)

## 2019-10-03 MED ORDER — TIZANIDINE HCL 4 MG PO TABS
4.0000 mg | ORAL_TABLET | Freq: Every day | ORAL | 0 refills | Status: AC
Start: 2019-10-03 — End: 2019-10-13

## 2019-10-03 MED ORDER — SODIUM CHLORIDE 0.9% FLUSH: 3.0000 mL | Freq: Once | INTRAVENOUS | Status: DC

## 2019-10-03 MED ORDER — IBUPROFEN 600 MG PO TABS
600.0000 mg | ORAL_TABLET | Freq: Four times a day (QID) | ORAL | 0 refills | Status: DC | PRN
Start: 2019-10-03 — End: 2019-10-27

## 2019-10-03 MED ORDER — FLUORESCEIN SODIUM 1 MG OP STRP
ORAL_STRIP | OPHTHALMIC | Status: AC
  Filled 2019-10-03: qty 2

## 2019-10-03 MED ORDER — POLYMYXIN B-TRIMETHOPRIM 10000-0.1 UNIT/ML-% OP SOLN
1.0000 [drp] | OPHTHALMIC | 0 refills | Status: DC
Start: 2019-10-03 — End: 2019-10-27

## 2019-10-03 NOTE — Discharge Instructions (Addendum)
You had a reassuring work-up in the emergency department prior to coming here.  I do not believe this is your heart  I do not see any clear scratches in your left eye however I want you to start using the drops and follow-up with the ophthalmologist for your blurry vision and recent eye pain  For your musculoskeletal pain take the ibuprofen every 8 hours and utilize the Zanaflex at night  I want you to follow-up with your infectious disease doctor as needed  Follow-up with the sports medicine group for your leg pain  Please call the family medicine center to establish care with a primary care doctor  If eye pain returns and is severe, vision worsens please go to the emergency department.

## 2019-10-03 NOTE — ED Triage Notes (Signed)
Pt c/o bilat leg pains, left eye pain and watery, and central chest pains for a coupe days. Reports was at work today and pains got so bad had to leave.
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
## 2019-10-03 NOTE — ED Triage Notes (Signed)
Patient reports bilateral ankle pain. Reports black marks where pain is on the front of her ankles.  Also endorses left eye pain and blurriness. Chest pain x1 wk after lifting heavy objects at work last week and pulling a muscle.   EKG, Trop, and labs drawn in ED

## 2019-10-06 ENCOUNTER — Other Ambulatory Visit: Payer: Self-pay | Admitting: Hematology and Oncology

## 2019-10-06 DIAGNOSIS — B20 Human immunodeficiency virus [HIV] disease: Secondary | ICD-10-CM

## 2019-10-06 MED ORDER — TRAMADOL HCL 50 MG PO TABS: ORAL_TABLET | ORAL | 0 refills | Status: DC

## 2019-10-11 ENCOUNTER — Other Ambulatory Visit: Payer: Self-pay | Admitting: *Deleted

## 2019-10-11 DIAGNOSIS — L299 Pruritus, unspecified: Secondary | ICD-10-CM

## 2019-10-11 MED ORDER — HYDROXYZINE HCL 10 MG PO TABS: 10.0000 mg | ORAL_TABLET | Freq: Three times a day (TID) | ORAL | 0 refills | Status: DC | PRN

## 2019-10-17 ENCOUNTER — Telehealth: Payer: Self-pay

## 2019-10-17 ENCOUNTER — Encounter: Payer: Self-pay | Admitting: Internal Medicine

## 2019-10-17 ENCOUNTER — Other Ambulatory Visit: Payer: Self-pay

## 2019-10-17 ENCOUNTER — Other Ambulatory Visit: Payer: Self-pay | Admitting: Hematology and Oncology

## 2019-10-17 ENCOUNTER — Ambulatory Visit (INDEPENDENT_AMBULATORY_CARE_PROVIDER_SITE_OTHER): Payer: 59 | Admitting: Internal Medicine

## 2019-10-17 VITALS — BP 120/74 | HR 58 | Temp 98.5°F | Wt 104.0 lb

## 2019-10-17 DIAGNOSIS — D013 Carcinoma in situ of anus and anal canal: Secondary | ICD-10-CM

## 2019-10-17 DIAGNOSIS — Z23 Encounter for immunization: Secondary | ICD-10-CM | POA: Diagnosis not present

## 2019-10-17 DIAGNOSIS — C8198 Hodgkin lymphoma, unspecified, lymph nodes of multiple sites: Secondary | ICD-10-CM

## 2019-10-17 DIAGNOSIS — B20 Human immunodeficiency virus [HIV] disease: Secondary | ICD-10-CM

## 2019-10-17 DIAGNOSIS — N9089 Other specified noninflammatory disorders of vulva and perineum: Secondary | ICD-10-CM | POA: Diagnosis not present

## 2019-10-17 MED ORDER — TRAMADOL HCL 50 MG PO TABS: ORAL_TABLET | ORAL | 0 refills | Status: DC

## 2019-10-17 NOTE — Assessment & Plan Note (Signed)
She has been referred to [facility]

## 2019-10-17 NOTE — Telephone Encounter (Signed)
done

## 2019-10-17 NOTE — Assessment & Plan Note (Signed)
I recommended she get the vaccine and she will go to her local pharmacy for this.

## 2019-10-17 NOTE — Progress Notes (Signed)
   Subjective:    Patient ID: [name], female    DOB: [birth year], 34 y.o.   MRN: [id number]  HPI Here for follow up of HIV She continues on Reyataz, norvir and Truvada.   She has been followed by oncology for Hodgkins lymphoma and reports she is now cancer free!  She rang the bell and had her port removed.  She is obviously pleased with that.  She is doing well with her ARVs and no missed doses.  No labs prior to the visit.  She did go to gynecology and had the vulvar lesion removed.    Review of Systems  Constitutional: Negative for fatigue and fever.  Gastrointestinal: Negative for diarrhea.       Objective:   Physical Exam Constitutional:      General: She is not in acute distress.    Appearance: She is well-developed.  Eyes:     General: No scleral icterus. Cardiovascular:     Rate and Rhythm: Normal rate and regular rhythm.     Heart sounds: Normal heart sounds. No murmur heard.   Pulmonary:     Effort: Pulmonary effort is normal. No respiratory distress.     Breath sounds: Normal breath sounds.  Skin:    Findings: No rash.  Neurological:     Mental Status: She is alert.    SH: still occasional tobacco       Assessment & Plan:

## 2019-10-17 NOTE — Telephone Encounter (Signed)
Called and told Rx sent. She verbalized understanding.  

## 2019-10-17 NOTE — Telephone Encounter (Signed)
She called and requested a refill on Tramadol Rx. 

## 2019-10-17 NOTE — Assessment & Plan Note (Signed)
No removed and no new issues reported.

## 2019-10-17 NOTE — Assessment & Plan Note (Signed)
Congratulated on being cancer free.

## 2019-10-18 LAB — T-HELPER CELL (CD4) - (RCID CLINIC ONLY)
CD4 % Helper T Cell: 43 % (ref 33–65)
CD4 T Cell Abs: 707 /uL (ref 400–1790)

## 2019-10-19 LAB — HIV-1 RNA QUANT-NO REFLEX-BLD
HIV 1 RNA Quant: <20 copies/mL
HIV-1 RNA Quant, Log: <1.3 Log copies/mL

## 2019-10-27 ENCOUNTER — Other Ambulatory Visit: Payer: Self-pay | Admitting: Hematology and Oncology

## 2019-10-27 ENCOUNTER — Ambulatory Visit (HOSPITAL_COMMUNITY)
Admission: EM | Admit: 2019-10-27 | Discharge: 2019-10-27 | Disposition: A | Discharge status: Home / Self Care | Payer: 59 | Attending: Family Medicine | Admitting: Family Medicine

## 2019-10-27 ENCOUNTER — Encounter (HOSPITAL_COMMUNITY): Payer: Self-pay

## 2019-10-27 ENCOUNTER — Other Ambulatory Visit: Payer: Self-pay

## 2019-10-27 ENCOUNTER — Telehealth: Payer: Self-pay

## 2019-10-27 DIAGNOSIS — N751 Abscess of Bartholin's gland: Secondary | ICD-10-CM | POA: Diagnosis not present

## 2019-10-27 DIAGNOSIS — B20 Human immunodeficiency virus [HIV] disease: Secondary | ICD-10-CM

## 2019-10-27 MED ORDER — CEPHALEXIN 500 MG PO CAPS
500.0000 mg | ORAL_CAPSULE | Freq: Two times a day (BID) | ORAL | 0 refills | Status: DC
Start: 2019-10-27 — End: 2020-01-04

## 2019-10-27 MED ORDER — TRAMADOL HCL 50 MG PO TABS: ORAL_TABLET | ORAL | 0 refills | Status: DC

## 2019-10-27 MED ORDER — LIDOCAINE 5 % EX OINT
1.0000 | TOPICAL_OINTMENT | CUTANEOUS | 0 refills | Status: DC | PRN
Start: 2019-10-27 — End: 2020-11-19

## 2019-10-27 NOTE — Telephone Encounter (Signed)
Called and given below message. She verbalized understanding. 
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
## 2019-10-27 NOTE — Telephone Encounter (Signed)
She called and requested a refill on Tramadol Rx. 

## 2019-10-27 NOTE — Discharge Instructions (Signed)
Use lidocaine ointment as needed to numb the painful area.  This will allow you to urinate and wash without pain Take antibiotic 2 times a day Warm compresses may help return if not better in a few days

## 2019-10-27 NOTE — Telephone Encounter (Signed)
done

## 2019-10-27 NOTE — ED Triage Notes (Signed)
Pt presents today with right labia swelling x3 days. Pt also states there is a "hard spot that has split open and has been draining white drainage". Pt denies fevers, chills, nausea, vomiting, body aches. Pt denies OTC treatment, or relieving factors.

## 2019-11-06 ENCOUNTER — Other Ambulatory Visit: Payer: Self-pay | Admitting: Hematology and Oncology

## 2019-11-06 DIAGNOSIS — B20 Human immunodeficiency virus [HIV] disease: Secondary | ICD-10-CM

## 2019-11-07 ENCOUNTER — Encounter: Payer: Self-pay | Admitting: Hematology and Oncology

## 2019-11-07 ENCOUNTER — Inpatient Hospital Stay: Payer: 59 | Attending: Hematology and Oncology | Admitting: Hematology and Oncology

## 2019-11-07 ENCOUNTER — Other Ambulatory Visit: Payer: 59

## 2019-11-07 ENCOUNTER — Inpatient Hospital Stay: Payer: 59

## 2019-11-07 ENCOUNTER — Other Ambulatory Visit: Payer: Self-pay

## 2019-11-07 DIAGNOSIS — Z21 Asymptomatic human immunodeficiency virus [HIV] infection status: Secondary | ICD-10-CM | POA: Insufficient documentation

## 2019-11-07 DIAGNOSIS — G893 Neoplasm related pain (acute) (chronic): Secondary | ICD-10-CM | POA: Diagnosis not present

## 2019-11-07 DIAGNOSIS — R630 Anorexia: Secondary | ICD-10-CM | POA: Diagnosis not present

## 2019-11-07 DIAGNOSIS — B20 Human immunodeficiency virus [HIV] disease: Secondary | ICD-10-CM

## 2019-11-07 DIAGNOSIS — C8118 Nodular sclerosis classical Hodgkin lymphoma, lymph nodes of multiple sites: Secondary | ICD-10-CM

## 2019-11-07 DIAGNOSIS — Z5111 Encounter for antineoplastic chemotherapy: Secondary | ICD-10-CM

## 2019-11-07 DIAGNOSIS — Z79899 Other long term (current) drug therapy: Secondary | ICD-10-CM | POA: Insufficient documentation

## 2019-11-07 DIAGNOSIS — C8198 Hodgkin lymphoma, unspecified, lymph nodes of multiple sites: Secondary | ICD-10-CM

## 2019-11-07 LAB — COMPREHENSIVE METABOLIC PANEL
ALT: 15 U/L (ref 0–44)
AST: 16 U/L (ref 15–41)
Albumin: 4 g/dL (ref 3.5–5.0)
Alkaline Phosphatase: 80 U/L (ref 38–126)
Anion gap: 9 (ref 5–15)
BUN: 13 mg/dL (ref 6–20)
CO2: 24 mmol/L (ref 22–32)
Calcium: 9.3 mg/dL (ref 8.9–10.3)
Chloride: 106 mmol/L (ref 98–111)
Creatinine, Ser: 1.11 mg/dL — ABNORMAL HIGH (ref 0.44–1.00)
GFR calc Af Amer: >60 mL/min (ref >60)
GFR calc non Af Amer: >60 mL/min (ref >60)
Glucose, Bld: 84 mg/dL (ref 70–99)
Potassium: 3.7 mmol/L (ref 3.5–5.1)
Sodium: 139 mmol/L (ref 135–145)
Total Bilirubin: 0.8 mg/dL (ref 0.3–1.2)
Total Protein: 6.9 g/dL (ref 6.5–8.1)

## 2019-11-07 LAB — CBC WITH DIFFERENTIAL/PLATELET
Abs Immature Granulocytes: 0.02 10*3/uL (ref 0.00–0.07)
Basophils Absolute: 0.1 10*3/uL (ref 0.0–0.1)
Basophils Relative: 1 %
Eosinophils Absolute: 0.1 10*3/uL (ref 0.0–0.5)
Eosinophils Relative: 2 %
HCT: 39.9 % (ref 36.0–46.0)
Hemoglobin: 14.1 g/dL (ref 12.0–15.0)
Immature Granulocytes: 0 %
Lymphocytes Relative: 32 %
Lymphs Abs: 1.9 10*3/uL (ref 0.7–4.0)
MCH: 34 pg (ref 26.0–34.0)
MCHC: 35.3 g/dL (ref 30.0–36.0)
MCV: 96.1 fL (ref 80.0–100.0)
Monocytes Absolute: 0.5 10*3/uL (ref 0.1–1.0)
Monocytes Relative: 8 %
Neutro Abs: 3.3 10*3/uL (ref 1.7–7.7)
Neutrophils Relative %: 57 %
Platelets: 256 10*3/uL (ref 150–400)
RBC: 4.15 MIL/uL (ref 3.87–5.11)
RDW: 12.4 % (ref 11.5–15.5)
WBC: 5.8 10*3/uL (ref 4.0–10.5)
nRBC: 0 % (ref 0.0–0.2)

## 2019-11-07 LAB — TSH: TSH: 4.231 u[IU]/mL — ABNORMAL HIGH (ref 0.308–3.960)

## 2019-11-07 NOTE — Progress Notes (Signed)
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[facility]) She has made informed decision to stop immunotherapy Examination is benign I will continue to see her every 3 months with history, physical examination and blood work We will defer surveillance imaging study unless she have signs and symptoms to suggest cancer recurrence  Human immunodeficiency virus (HIV) disease ([place]) She is doing well in this regard She is compliant taking her medications as prescribed Her recent viral load was undetectable  Cancer associated pain She has multiple chronic joint pain and is dependent on tramadol to control her joint pain She is taking vitamin D supplement as prescribed We discussed refill policy.  Anorexia She has not lost any weight Her TSH is borderline, likely due to prior history of exposure to immunotherapy Observe for now   No orders of the defined types were placed in this encounter.   All questions were answered. The patient knows to call the clinic with any problems, questions or concerns. The total time spent in the appointment was 20 minutes encounter with patients including review of chart and various tests results, discussions about plan of care and coordination of care plan   Heath Lark, MD 11/07/2019 10:21 AM  INTERVAL HISTORY: Please see below for problem oriented charting. She returns for further follow-up No recent fever or chills No new lymphadenopathy Her appetite is stable She has not gained any weight She is compliant taking her medications as  prescribed  SUMMARY OF ONCOLOGIC HISTORY: Oncology History  Hodgkin lymphoma, nodular sclerosis ([place])  05/06/2014 Imaging   CT scan of the abdomen show diffuse mesenteric lymphadenopathy.   05/07/2014 Imaging   CT scan of the chest show right thoracic inlet lymphadenopathy   06/07/2014 Procedure   She underwent ultrasound-guided core biopsy of the neck lymph node   06/07/2014 Pathology Results   Accession: [id number] biopsy confirmed diagnosis of Hodgkin lymphoma.   06/15/2014 Imaging   Echocardiogram showed preserved ejection fraction   07/09/2014 - 07/12/2014 Hospital Admission   She was admitted to the hospital for severe anemia.   07/27/2014 Procedure   She had placement of port   07/31/2014 - 09/11/2014 Chemotherapy   She received dose adjusted chemotherapy due to abnormal liver function tests and severe anemia. Treatment was delayed due to noncompliance  and subsequently stopped because the patient failed to keep appointments   01/11/2015 Imaging   Repeat PET CT scan showed response to treatment   01/28/2015 - 06/18/2015 Chemotherapy   ABVD was restarted with full dose.   02/08/2015 - 02/10/2015 Hospital Admission   The patient was admitted to the hospital due to pancytopenia and profuse diarrhea. Cultures were negative. She was placed on ciprofloxacin.   02/11/2015 Adverse Reaction   Treatment was placed on hold due to recent infection.   04/11/2015 Imaging   PET CT scan showed near complete response. Incidental finding of an abnormal bone lesion, indeterminate. She is not symptomatic. Recommendation from Hem [facility] to observe   07/11/2015 Imaging   PET CT scan showed abnormal new bone lesions, suggestive of possible disease progression   07/23/2015 Bone Marrow Biopsy   She underwent bone biopsy  07/23/2015 Pathology Results   Accession: [id number]  biopsy was negative for cancer   11/21/2015 Surgery   She had surgery for ectopic pregnancy   01/29/2016 Imaging   Ct chest,  abdomen and pelvis showed pelvic and retroperitoneal lymphadenopathy, as above, concerning for residual disease. There is also a mildly enlarged posterior mediastinal lymph node measuring 11 mm adjacent to the distal descending thoracic aorta. This may represent an additional focus of disease, but is the only finding of concern in the thorax on today's examination. Sclerosis in the right ilium at site of previously noted metabolically active lesion, grossly unchanged. No other definite osseous lesions are identified on today's examination. Spleen is normal in size and appearance.   02/14/2016 PET scan   Interval disease worsening with new foci of hypermetabolic activity in multiple retroperitoneal and pelvic lymph nodes as well as AP window and left hilar lymph nodes. (Deauville 5). There is also overall worsening of the osseous disease.   05/11/2016 Pathology Results   Diagnosis Lymph node, needle/core biopsy, Left para-aortic retroperitoneal - CLASSICAL HODGKIN LYMPHOMA. - SEE ONCOLOGY TABLE. Microscopic Comment LYMPHOMA Histologic type: Classical Hodgkin lymphoma. Grade (if applicable): N/A Flow cytometry: Not done. Immunohistochemical stains: CD15, CD20, CD3, LCA, PAX-5, CD30 with appropriate controls. Touch preps/imprints: Not performed. Comments: The sections show small needle core biopsy fragments displaying a polymorphous cellular proliferation of small lymphocytes, plasma cells, eosinophils, and large atypical mononuclear and multilobated lymphoid cells with features of Reed-Sternberg cells and variants. This is associated with patchy areas of fibrosis. Immunohistochemical stains were performed and show that the large atypical lymphoid cells are positive for CD30, CD15 and PAX-5 and negative for LCA, CD20, CD3. The small lymphoid cells in the background show a mixture of T and B cells with predominance of T cells. The overall morphologic and histologic features are consistent with classical  Hodgkin lymphoma. Further subtyping is challenging in limited small biopsy fragments but the patchy fibrosis suggests nodular sclerosis type.    05/11/2016 Procedure   She underwent CT guided biopsy of retroperitoneal lymph node   05/26/2016 Procedure   Successful placement of a right IJ approach Power Port with ultrasound and fluoroscopic guidance. The catheter is ready for use.   06/02/2016 PET scan   Mixed response to chemotherapy with some lymph nodes decreased in metabolic activity and some lymph nodes increase metabolic activity. Lymph node stations including mediastinum, periaortic retroperitoneum, and obturator node stations. Activity is remains relatively intense Deauville 4 2. LEFT infrahilar nodule / lymph node with intense metabolic activity decreased from prior. ( Deauville 4 ). 3. 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 Deauville 5   06/03/2016 - 06/05/2016 Hospital Admission   She was admitted to the hospital for cycle 1 of ICE chemotherapy   06/24/2016 - 06/26/2016 Hospital Admission   She received cycle 2 of ICE chemo   07/07/2016 PET scan   Resolution of prior hypermetabolic adenopathy and resolution of prior osseous foci of hypermetabolic activity compatible with essentially complete response to therapy (Deauville 1). 2. Generalized reduced activity in the L4 vertebral body and the type of finding which would typically reflect prior radiation therapy. 3. Stable septated fatty right pelvic lesion, possibly a dermoid or lipoma, not hypermetabolic   1/51/7616 PET scan   Hypermetabolic lesion along the L3 vertebral body and left posterior elements, max SUV 7.8 (Deauville 5). Hypermetabolic lesion along the left inferior pubic ramus, max SUV 4.9 (Deauville 4).  IMPRESSION: Prevascular lymphadenopathy, reflecting nodal recurrence (Deauville 4).  Hypermetabolic osseous metastases involving the L3 vertebral body/posterior elements and left inferior pubic ramus  (Deauville 4-5). Hypermetabolism along the endometrium, new, possibly reactive/physiologic. Consider pelvic ultrasound and/or endometrial sampling as clinically warranted.   11/04/2016 - 02/24/2017 Chemotherapy   She received Brentuximab   12/11/2016 PET scan   1. Mixed response to therapy within the skeleton. Lesions at L3 is decreased in size and metabolic activity. Residual activity remains above liver ( Deauville 4) 2. Increased activity in the LEFT sacrum with metabolic activity above liver activity ( Deauville 4 3. Decrease in size and metabolic activity of anterior mediastinal tissue consistent with resolution of thymic hyperplasia or resolution of lymphoma metabolic activity ( Deauville 2). 4. No new lymphadenopathy. Normal spleen and liver.   03/25/2017 PET scan   1. Increased metabolic activity in several normal sized retroperitoneal and right pelvic lymph nodes, Deauville 4. 2. Increase in size and metabolic activity of lesions in the L3 and L4 vertebral bodies and in the left sacrum, Deauville 5. 3. No recurrence of anterior mediastinal abnormal activity. 4. Other imaging findings of potential clinical significance: Chronic ethmoid and left maxillary sinusitis. Paraseptal emphysema at the lung apices. Right ovarian dermoid.   04/06/2017 -  Chemotherapy   The patient had Keytruda   04/06/2017 - 07/17/2019 Chemotherapy   The patient had pembrolizumab (KEYTRUDA) 200 mg in sodium chloride 0.9 % 50 mL chemo infusion, 200 mg, Intravenous, Once, 30 of 31 cycles Administration: 200 mg (04/06/2017), 200 mg (04/29/2017), 200 mg (05/20/2017), 200 mg (06/10/2017), 200 mg (07/01/2017), 200 mg (07/22/2017), 200 mg (08/12/2017), 200 mg (09/02/2017), 200 mg (10/04/2017), 200 mg (11/03/2017), 200 mg (12/02/2017), 200 mg (12/30/2017), 200 mg (01/27/2018), 200 mg (02/24/2018), 200 mg (03/22/2018), 200 mg (04/19/2018), 200 mg (05/17/2018), 200 mg (06/14/2018), 200 mg (07/12/2018), 200 mg (08/09/2018), 200 mg (09/06/2018), 200  mg (10/04/2018), 200 mg (11/01/2018), 200 mg (11/29/2018), 200 mg (12/27/2018), 200 mg (01/24/2019), 200 mg (03/14/2019), 200 mg (05/23/2019)  for chemotherapy treatment.    06/09/2017 PET scan   1. Overall significant improvement with reduction in nodal activity. Several retroperitoneal nodes which were previously Deauville 4 are currently Deauville 3. The right common iliac lymph node remains at Deauville 4 with maximum SUV of 3.4, but is improved from prior SUV of 4.3. 2. The bony lesions show the greatest improvement, with the previously Deauville 5 hypermetabolic lesions currently no longer of higher metabolic activity than surrounding bone marrow, currently measuring at Deauville 3. 3. 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 4. No new lesions are identified. 5. Emphysema (ICD10-J43.9).   08/05/2017 Imaging   1. Somewhat complex left adnexal lesion, potentially hemorrhagic cyst measuring 2.9 x 2.8 cm. The possibility of torsion in the left ovary must be considered. Advise correlation with pelvic ultrasound including Doppler assessment to further evaluate.  2.  Right ovarian dermoid, unchanged from recent PET-CT examination.  3. No adenopathy by size criteria evident. Appearance of subcentimeter mesenteric lymph nodes is stable compared to recent study.  4. No bone lesions appreciable by CT. Areas of abnormal radiotracer uptake in the mid lumbar spine noted on recent PET-CT examination. No destruction 2 or lytic change noted in the lumbar vertebrae currently there.  5. No bowel obstruction. No abscess. No periappendiceal region inflammation.  6.  Small hiatal hernia with fluid in distal esophagus.  7.  Foci of coronary artery calcification, advanced for age.  8.  Spleen normal in size and contour.  9. Small hiatal hernia with mild fluid  in the distal esophagus, likely indicative of a degree of reflux.   01/04/2018 Imaging   1. No findings to  suggest residual/recurrent lymphoma in the chest, abdomen or pelvis. 2. Right ovarian dermoid slightly larger than prior examinations, currently measuring 3.8 x 2.8 x 3.1 cm. 3. Additional incidental findings, as above.   07/11/2018 Imaging   No findings suspicious for recurrent lymphoma. Spleen is normal in size.  Stable right ovarian dermoid.  4 mm subpleural nodular opacity in the posterior right lower lobe, favoring subpleural atelectasis.   07/17/2019 Imaging   1. Stable exam. No new or progressive findings to suggest recurrent disease. 2. Stable right adnexal dermoid. 3. Emphysema (ICD10-J43.9).   09/19/2019 Procedure   Successful right IJ vein Port-A-Cath explant.     REVIEW OF SYSTEMS:   Constitutional: Denies fevers, chills or abnormal weight loss Eyes: Denies blurriness of vision Ears, nose, mouth, throat, and face: Denies mucositis or sore throat Respiratory: Denies cough, dyspnea or wheezes Cardiovascular: Denies palpitation, chest discomfort or lower extremity swelling Gastrointestinal:  Denies nausea, heartburn or change in bowel habits Skin: Denies abnormal skin rashes Lymphatics: Denies new lymphadenopathy or easy bruising Neurological:Denies numbness, tingling or new weaknesses Behavioral/Psych: Mood is stable, no new changes  All other systems were reviewed with the patient and are negative.  I have reviewed the past medical history, past surgical history, social history and family history with the patient and they are unchanged from previous note.  ALLERGIES:  is allergic to temazepam.  MEDICATIONS:  Current Outpatient Medications  Medication Sig Dispense Refill  . atazanavir (REYATAZ) 300 MG capsule TAKE ONE CAPSULE BY MOUTH DAILY WITH BREAKFAST. TAKE WITH NORVIR 90 capsule 1  . cephALEXin (KEFLEX) 500 MG capsule Take 1 capsule (500 mg total) by mouth 2 (two) times daily. 10 capsule 0  . cholecalciferol (VITAMIN D) 1000 units tablet Take 1 tablet (1,000  Units total) by mouth daily. 30 tablet 9  . emtricitabine-tenofovir (TRUVADA) 200-300 MG tablet Take 1 tablet by mouth daily. 90 tablet 1  . lidocaine (XYLOCAINE) 5 % ointment Apply 1 application topically as needed. Will numb skin for cleaning and urination 30 g 0  . ritonavir (NORVIR) 100 MG TABS tablet TAKE 1 TABLET BY MOUTH EVERY MORNING WITH BREAKFAST. TAKE WITH REYATAZ 90 tablet 1  . traMADol (ULTRAM) 50 MG tablet TAKE 2 TABLETS(100 MG) BY MOUTH EVERY 6 HOURS AS NEEDED FOR MODERATE PAIN 90 tablet 0   No current facility-administered medications for this visit.    PHYSICAL EXAMINATION: ECOG PERFORMANCE STATUS: 1 - Symptomatic but completely ambulatory  Vitals:   11/07/19 0836  BP: 110/73  Pulse: 83  Resp: 18  Temp: 98.2 F (36.8 C)   Filed Weights   11/07/19 0836  Weight: 104 lb 11.2 oz (47.5 kg)    GENERAL:alert, no distress and comfortable SKIN: skin color, texture, turgor are normal, no rashes or significant lesions EYES: normal, Conjunctiva are pink and non-injected, sclera clear OROPHARYNX:no exudate, no erythema and lips, buccal mucosa, and tongue normal  NECK: supple, thyroid normal size, non-tender, without nodularity LYMPH:  no palpable lymphadenopathy in the cervical, axillary or inguinal LUNGS: clear to auscultation and percussion with normal breathing effort HEART: regular rate & rhythm and no murmurs and no lower extremity edema ABDOMEN:abdomen soft, non-tender and normal bowel sounds Musculoskeletal:no cyanosis of digits and no clubbing  NEURO: alert & oriented x 3 with fluent speech, no focal motor/sensory deficits  LABORATORY DATA:  I have reviewed the data as listed  Component Value Date/Time   NA 139 11/07/2019 0740   NA 140 04/29/2017 0745   K 3.7 11/07/2019 0740   K 3.3 (L) 04/29/2017 0745   CL 106 11/07/2019 0740   CO2 24 11/07/2019 0740   CO2 23 04/29/2017 0745   GLUCOSE 84 11/07/2019 0740   GLUCOSE 83 04/29/2017 0745   BUN 13 11/07/2019  0740   BUN 10.7 04/29/2017 0745   CREATININE 1.11 (H) 11/07/2019 0740   CREATININE 1.2 (H) 04/29/2017 0745   CALCIUM 9.3 11/07/2019 0740   CALCIUM 9.8 04/29/2017 0745   PROT 6.9 11/07/2019 0740   PROT 7.6 04/29/2017 0745   ALBUMIN 4.0 11/07/2019 0740   ALBUMIN 4.0 04/29/2017 0745   AST 16 11/07/2019 0740   AST 19 04/29/2017 0745   ALT 15 11/07/2019 0740   ALT 19 04/29/2017 0745   ALKPHOS 80 11/07/2019 0740   ALKPHOS 81 04/29/2017 0745   BILITOT 0.8 11/07/2019 0740   BILITOT 1.31 (H) 04/29/2017 0745   GFRNONAA >60 11/07/2019 0740   GFRNONAA 83 02/20/2016 0908   GFRAA >60 11/07/2019 0740   GFRAA >89 02/20/2016 0908    No results found for: SPEP, UPEP  Lab Results  Component Value Date   WBC 5.8 11/07/2019   NEUTROABS 3.3 11/07/2019   HGB 14.1 11/07/2019   HCT 39.9 11/07/2019   MCV 96.1 11/07/2019   PLT 256 11/07/2019      Chemistry      Component Value Date/Time   NA 139 11/07/2019 0740   NA 140 04/29/2017 0745   K 3.7 11/07/2019 0740   K 3.3 (L) 04/29/2017 0745   CL 106 11/07/2019 0740   CO2 24 11/07/2019 0740   CO2 23 04/29/2017 0745   BUN 13 11/07/2019 0740   BUN 10.7 04/29/2017 0745   CREATININE 1.11 (H) 11/07/2019 0740   CREATININE 1.2 (H) 04/29/2017 0745      Component Value Date/Time   CALCIUM 9.3 11/07/2019 0740   CALCIUM 9.8 04/29/2017 0745   ALKPHOS 80 11/07/2019 0740   ALKPHOS 81 04/29/2017 0745   AST 16 11/07/2019 0740   AST 19 04/29/2017 0745   ALT 15 11/07/2019 0740   ALT 19 04/29/2017 0745   BILITOT 0.8 11/07/2019 0740   BILITOT 1.31 (H) 04/29/2017 0745

## 2019-11-07 NOTE — Assessment & Plan Note (Signed)
She has made informed decision to stop immunotherapy Examination is benign I will continue to see her every 3 months with history, physical examination and blood work We will defer surveillance imaging study unless she have signs and symptoms to suggest cancer recurrence

## 2019-11-07 NOTE — Assessment & Plan Note (Signed)
She has multiple chronic joint pain and is dependent on tramadol to control her joint pain She is taking vitamin D supplement as prescribed We discussed refill policy. 

## 2019-11-07 NOTE — Assessment & Plan Note (Signed)
She has not lost any weight Her TSH is borderline, likely due to prior history of exposure to immunotherapy Observe for now

## 2019-11-07 NOTE — Assessment & Plan Note (Signed)
She is doing well in this regard She is compliant taking her medications as prescribed Her recent viral load was undetectable

## 2019-11-16 ENCOUNTER — Telehealth: Payer: Self-pay

## 2019-11-16 NOTE — Telephone Encounter (Signed)
She called and requested Tramadol Rx. Told her Dr. Alvy Bimler would send refill in am. She verbalized understanding.

## 2019-11-17 ENCOUNTER — Other Ambulatory Visit: Payer: Self-pay | Admitting: Internal Medicine

## 2019-11-17 ENCOUNTER — Other Ambulatory Visit: Payer: Self-pay

## 2019-11-17 ENCOUNTER — Other Ambulatory Visit: Payer: Self-pay | Admitting: Hematology and Oncology

## 2019-11-17 DIAGNOSIS — B20 Human immunodeficiency virus [HIV] disease: Secondary | ICD-10-CM

## 2019-11-17 MED ORDER — RITONAVIR 100 MG PO TABS: ORAL_TABLET | ORAL | 1 refills | Status: DC

## 2019-11-17 MED ORDER — ATAZANAVIR SULFATE 300 MG PO CAPS: ORAL_CAPSULE | ORAL | 1 refills | Status: DC

## 2019-11-17 MED ORDER — EMTRICITABINE-TENOFOVIR DF 200-300 MG PO TABS: 1.0000 | ORAL_TABLET | Freq: Every day | ORAL | 1 refills | Status: DC

## 2019-11-17 MED ORDER — TRAMADOL HCL 50 MG PO TABS: 100.0000 mg | ORAL_TABLET | Freq: Four times a day (QID) | ORAL | 0 refills | Status: DC | PRN

## 2019-11-17 NOTE — Telephone Encounter (Signed)
done

## 2019-11-17 NOTE — Telephone Encounter (Signed)
Called and given below message. She verbalized understanding. 

## 2019-11-27 ENCOUNTER — Other Ambulatory Visit: Payer: Self-pay | Admitting: Hematology and Oncology

## 2019-11-27 ENCOUNTER — Telehealth: Payer: Self-pay

## 2019-11-27 DIAGNOSIS — B20 Human immunodeficiency virus [HIV] disease: Secondary | ICD-10-CM

## 2019-11-27 MED ORDER — TRAMADOL HCL 50 MG PO TABS: 100.0000 mg | ORAL_TABLET | Freq: Four times a day (QID) | ORAL | 0 refills | Status: DC | PRN

## 2019-11-27 NOTE — Telephone Encounter (Signed)
She called and requested refill on Tramadol Rx.

## 2019-11-27 NOTE — Telephone Encounter (Signed)
done

## 2019-11-27 NOTE — Telephone Encounter (Signed)
Called and given below message. She verbalized understanding. 

## 2019-11-28 ENCOUNTER — Telehealth: Payer: Self-pay

## 2019-11-28 ENCOUNTER — Other Ambulatory Visit: Payer: Self-pay | Admitting: Hematology and Oncology

## 2019-11-28 DIAGNOSIS — C8118 Nodular sclerosis classical Hodgkin lymphoma, lymph nodes of multiple sites: Secondary | ICD-10-CM

## 2019-11-28 DIAGNOSIS — R590 Localized enlarged lymph nodes: Secondary | ICD-10-CM

## 2019-11-28 NOTE — Telephone Encounter (Signed)
She called and complaining of itching at times with no rash. She thought the itching would go away once she stopped treatment.  Called back. Dr. Alvy Bimler will order at CT scan and follow up appt to make sure it is not recurrent lymphoma. If negative Dr. Alvy Bimler will send referral for dermatology.  She is agreeable to appts/ scan.

## 2019-11-29 ENCOUNTER — Telehealth: Payer: Self-pay | Admitting: Hematology and Oncology

## 2019-11-29 NOTE — Telephone Encounter (Signed)
Scheduled per 8/3 sch msg. Called and spoke with pt confirmed 8/11 appt

## 2019-12-05 ENCOUNTER — Ambulatory Visit (HOSPITAL_COMMUNITY)
Admission: RE | Admit: 2019-12-05 | Discharge: 2019-12-05 | Disposition: A | Discharge status: Home / Self Care | Payer: 59 | Source: Ambulatory Visit | Attending: Hematology and Oncology | Admitting: Hematology and Oncology

## 2019-12-05 ENCOUNTER — Other Ambulatory Visit: Payer: Self-pay

## 2019-12-05 DIAGNOSIS — C8118 Nodular sclerosis classical Hodgkin lymphoma, lymph nodes of multiple sites: Secondary | ICD-10-CM | POA: Diagnosis present

## 2019-12-05 DIAGNOSIS — R590 Localized enlarged lymph nodes: Secondary | ICD-10-CM | POA: Insufficient documentation

## 2019-12-05 MED ORDER — IOHEXOL 300 MG/ML  SOLN
100.0000 mL | Freq: Once | INTRAMUSCULAR | Status: AC | PRN
  Administered 2019-12-05: 80 mL via INTRAVENOUS

## 2019-12-05 MED ORDER — SODIUM CHLORIDE (PF) 0.9 % IJ SOLN
INTRAMUSCULAR | Status: AC
  Filled 2019-12-05: qty 50

## 2019-12-06 ENCOUNTER — Telehealth: Payer: Self-pay | Admitting: Hematology and Oncology

## 2019-12-06 ENCOUNTER — Encounter: Payer: Self-pay | Admitting: Hematology and Oncology

## 2019-12-06 ENCOUNTER — Other Ambulatory Visit: Payer: Self-pay

## 2019-12-06 ENCOUNTER — Other Ambulatory Visit: Payer: Self-pay | Admitting: Hematology and Oncology

## 2019-12-06 ENCOUNTER — Inpatient Hospital Stay: Payer: 59 | Attending: Hematology and Oncology | Admitting: Hematology and Oncology

## 2019-12-06 DIAGNOSIS — L299 Pruritus, unspecified: Secondary | ICD-10-CM | POA: Diagnosis not present

## 2019-12-06 DIAGNOSIS — M064 Inflammatory polyarthropathy: Secondary | ICD-10-CM | POA: Insufficient documentation

## 2019-12-06 DIAGNOSIS — J438 Other emphysema: Secondary | ICD-10-CM | POA: Diagnosis not present

## 2019-12-06 DIAGNOSIS — C8118 Nodular sclerosis classical Hodgkin lymphoma, lymph nodes of multiple sites: Secondary | ICD-10-CM | POA: Insufficient documentation

## 2019-12-06 DIAGNOSIS — B20 Human immunodeficiency virus [HIV] disease: Secondary | ICD-10-CM

## 2019-12-06 DIAGNOSIS — M1990 Unspecified osteoarthritis, unspecified site: Secondary | ICD-10-CM

## 2019-12-06 DIAGNOSIS — J439 Emphysema, unspecified: Secondary | ICD-10-CM | POA: Insufficient documentation

## 2019-12-06 NOTE — Assessment & Plan Note (Signed)
She continues to have intermittent chronic joint pain She will continue tramadol as needed I reminded her the importance of taking vitamin D supplement

## 2019-12-06 NOTE — Assessment & Plan Note (Signed)
I have reviewed her CT imaging She has no signs of cancer I will see her back in 4 months for further follow-up I do not know the cause of her skin itching

## 2019-12-06 NOTE — Assessment & Plan Note (Signed)
We discussed the importance of tobacco cessation

## 2019-12-06 NOTE — Assessment & Plan Note (Addendum)
The cause is unknown She has no signs of recurrent lymphoma or liver disease It is not clear to me whether her antiretroviral treatment is the cause of this She did state that her skin is dry I recommend topical emollient cream

## 2019-12-06 NOTE — Telephone Encounter (Signed)
Scheduled appts per 8/11 sch msg. Pt declined print out of AVS.

## 2019-12-06 NOTE — Progress Notes (Signed)
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[place]) I have reviewed her CT imaging She has no signs of cancer I will see her back in 4 months for further follow-up I do not know the cause of her skin itching   Emphysema of lung ([place]) We discussed the importance of tobacco cessation  Itch of skin The cause is unknown She has no signs of recurrent lymphoma or liver disease It is not clear to me whether her antiretroviral treatment is the cause of this She did state that her skin is dry I recommend topical emollient cream  Inflammatory arthritis She continues to have intermittent chronic joint pain She will continue tramadol as needed I reminded her the importance of taking vitamin D supplement   No orders of the defined types were placed in this encounter.   All questions were answered. The patient knows to call the clinic with any problems, questions or concerns. The total time spent in the appointment was 20 minutes encounter with patients including review of chart and various tests results, discussions about plan of care and coordination of care plan   Heath Lark, MD 12/06/2019 9:45 AM  INTERVAL HISTORY: Please see below for problem oriented charting. 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   05/07/2014 Imaging   CT scan of the chest show right thoracic inlet lymphadenopathy   06/07/2014 Procedure   She underwent ultrasound-guided core biopsy of the neck lymph node   06/07/2014 Pathology Results   Accession: [id number] biopsy confirmed diagnosis of Hodgkin lymphoma.   06/15/2014 Imaging   Echocardiogram showed preserved ejection fraction   07/09/2014 - 07/12/2014 Hospital Admission   She was admitted to the hospital for severe anemia.   07/27/2014 Procedure   She had placement of port   07/31/2014 - 09/11/2014 Chemotherapy   She received dose adjusted chemotherapy due to abnormal liver function tests and severe anemia. Treatment was delayed due to noncompliance  and subsequently stopped because the patient failed to keep appointments   01/11/2015 Imaging   Repeat PET CT scan showed response to treatment   01/28/2015 - 06/18/2015 Chemotherapy   ABVD was restarted with full dose.   02/08/2015 - 02/10/2015 Hospital Admission   The patient was admitted to the hospital due to pancytopenia and profuse diarrhea. Cultures were negative. She was placed on ciprofloxacin.   02/11/2015 Adverse Reaction   Treatment was placed on hold due to recent infection.   04/11/2015 Imaging   PET CT scan showed near complete response. Incidental finding of an abnormal bone lesion, indeterminate. She is not symptomatic. Recommendation from [facility] to observe   07/11/2015 Imaging   PET CT scan showed abnormal new bone lesions, suggestive of possible disease progression   07/23/2015  Bone Marrow Biopsy   She underwent bone biopsy   07/23/2015 Pathology Results   Accession: [id number]  biopsy was negative for cancer   11/21/2015 Surgery   She had surgery for ectopic pregnancy   01/29/2016 Imaging   Ct chest, abdomen and pelvis showed pelvic and  retroperitoneal lymphadenopathy, as above, concerning for residual disease. There is also a mildly enlarged posterior mediastinal lymph node measuring 11 mm adjacent to the distal descending thoracic aorta. This may represent an additional focus of disease, but is the only finding of concern in the thorax on today's examination. Sclerosis in the right ilium at site of previously noted metabolically active lesion, grossly unchanged. No other definite osseous lesions are identified on today's examination. Spleen is normal in size and appearance.   02/14/2016 PET scan   Interval disease worsening with new foci of hypermetabolic activity in multiple retroperitoneal and pelvic lymph nodes as well as AP window and left hilar lymph nodes. (Deauville 5). There is also overall worsening of the osseous disease.   05/11/2016 Pathology Results   Diagnosis Lymph node, needle/core biopsy, Left para-aortic retroperitoneal - CLASSICAL HODGKIN LYMPHOMA. - SEE ONCOLOGY TABLE. Microscopic Comment LYMPHOMA Histologic type: Classical Hodgkin lymphoma. Grade (if applicable): N/A Flow cytometry: Not done. Immunohistochemical stains: CD15, CD20, CD3, LCA, PAX-5, CD30 with appropriate controls. Touch preps/imprints: Not performed. Comments: The sections show small needle core biopsy fragments displaying a polymorphous cellular proliferation of small lymphocytes, plasma cells, eosinophils, and large atypical mononuclear and multilobated lymphoid cells with features of Reed-Sternberg cells and variants. This is associated with patchy areas of fibrosis. Immunohistochemical stains were performed and show that the large atypical lymphoid cells are positive for CD30, CD15 and PAX-5 and negative for LCA, CD20, CD3. The small lymphoid cells in the background show a mixture of T and B cells with predominance of T cells. The overall morphologic and histologic features are consistent with classical Hodgkin lymphoma. Further subtyping  is challenging in limited small biopsy fragments but the patchy fibrosis suggests nodular sclerosis type.    05/11/2016 Procedure   She underwent CT guided biopsy of retroperitoneal lymph node   05/26/2016 Procedure   Successful placement of a right IJ approach Power Port with ultrasound and fluoroscopic guidance. The catheter is ready for use.   06/02/2016 PET scan   Mixed response to chemotherapy with some lymph nodes decreased in metabolic activity and some lymph nodes increase metabolic activity. Lymph node stations including mediastinum, periaortic retroperitoneum, and obturator node stations. Activity is remains relatively intense Deauville 4 2. LEFT infrahilar nodule / lymph node with intense metabolic activity decreased from prior. ( Deauville 4 ). 3. 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 Deauville 5   06/03/2016 - 06/05/2016 Hospital Admission   She was admitted to the hospital for cycle 1 of ICE chemotherapy   06/24/2016 - 06/26/2016 Hospital Admission   She received cycle 2 of ICE chemo   07/07/2016 PET scan   Resolution of prior hypermetabolic adenopathy and resolution of prior osseous foci of hypermetabolic activity compatible with essentially complete response to therapy (Deauville 1). 2. Generalized reduced activity in the L4 vertebral body and the type of finding which would typically reflect prior radiation therapy. 3. Stable septated fatty right pelvic lesion, possibly a dermoid or lipoma, not hypermetabolic   7/32/2025 PET scan   Hypermetabolic lesion along the L3 vertebral body and left posterior elements, max SUV 7.8 (Deauville 5). Hypermetabolic lesion along the left inferior pubic ramus, max SUV 4.9 (  Deauville 4).  IMPRESSION: Prevascular lymphadenopathy, reflecting nodal recurrence (Deauville 4). Hypermetabolic osseous metastases involving the L3 vertebral body/posterior elements and left inferior pubic ramus (Deauville 4-5). Hypermetabolism  along the endometrium, new, possibly reactive/physiologic. Consider pelvic ultrasound and/or endometrial sampling as clinically warranted.   11/04/2016 - 02/24/2017 Chemotherapy   She received Brentuximab   12/11/2016 PET scan   1. Mixed response to therapy within the skeleton. Lesions at L3 is decreased in size and metabolic activity. Residual activity remains above liver ( Deauville 4) 2. Increased activity in the LEFT sacrum with metabolic activity above liver activity ( Deauville 4 3. Decrease in size and metabolic activity of anterior mediastinal tissue consistent with resolution of thymic hyperplasia or resolution of lymphoma metabolic activity ( Deauville 2). 4. No new lymphadenopathy. Normal spleen and liver.   03/25/2017 PET scan   1. Increased metabolic activity in several normal sized retroperitoneal and right pelvic lymph nodes, Deauville 4. 2. Increase in size and metabolic activity of lesions in the L3 and L4 vertebral bodies and in the left sacrum, Deauville 5. 3. No recurrence of anterior mediastinal abnormal activity. 4. Other imaging findings of potential clinical significance: Chronic ethmoid and left maxillary sinusitis. Paraseptal emphysema at the lung apices. Right ovarian dermoid.   04/06/2017 -  Chemotherapy   The patient had Keytruda   04/06/2017 - 07/17/2019 Chemotherapy   The patient had pembrolizumab (KEYTRUDA) 200 mg in sodium chloride 0.9 % 50 mL chemo infusion, 200 mg, Intravenous, Once, 30 of 31 cycles Administration: 200 mg (04/06/2017), 200 mg (04/29/2017), 200 mg (05/20/2017), 200 mg (06/10/2017), 200 mg (07/01/2017), 200 mg (07/22/2017), 200 mg (08/12/2017), 200 mg (09/02/2017), 200 mg (10/04/2017), 200 mg (11/03/2017), 200 mg (12/02/2017), 200 mg (12/30/2017), 200 mg (01/27/2018), 200 mg (02/24/2018), 200 mg (03/22/2018), 200 mg (04/19/2018), 200 mg (05/17/2018), 200 mg (06/14/2018), 200 mg (07/12/2018), 200 mg (08/09/2018), 200 mg (09/06/2018), 200 mg (10/04/2018), 200 mg  (11/01/2018), 200 mg (11/29/2018), 200 mg (12/27/2018), 200 mg (01/24/2019), 200 mg (03/14/2019), 200 mg (05/23/2019)  for chemotherapy treatment.    06/09/2017 PET scan   1. Overall significant improvement with reduction in nodal activity. Several retroperitoneal nodes which were previously Deauville 4 are currently Deauville 3. The right common iliac lymph node remains at Deauville 4 with maximum SUV of 3.4, but is improved from prior SUV of 4.3. 2. The bony lesions show the greatest improvement, with the previously Deauville 5 hypermetabolic lesions currently no longer of higher metabolic activity than surrounding bone marrow, currently measuring at Deauville 3. 3. 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 4. No new lesions are identified. 5. Emphysema (ICD10-J43.9).   08/05/2017 Imaging   1. Somewhat complex left adnexal lesion, potentially hemorrhagic cyst measuring 2.9 x 2.8 cm. The possibility of torsion in the left ovary must be considered. Advise correlation with pelvic ultrasound including Doppler assessment to further evaluate.  2.  Right ovarian dermoid, unchanged from recent PET-CT examination.  3. No adenopathy by size criteria evident. Appearance of subcentimeter mesenteric lymph nodes is stable compared to recent study.  4. No bone lesions appreciable by CT. Areas of abnormal radiotracer uptake in the mid lumbar spine noted on recent PET-CT examination. No destruction 2 or lytic change noted in the lumbar vertebrae currently there.  5. No bowel obstruction. No abscess. No periappendiceal region inflammation.  6.  Small hiatal hernia with fluid in distal esophagus.  7.  Foci of coronary artery calcification, advanced for age.  8.  Spleen normal in  size and contour.  9. Small hiatal hernia with mild fluid in the distal esophagus, likely indicative of a degree of reflux.   01/04/2018 Imaging   1. No findings to suggest  residual/recurrent lymphoma in the chest, abdomen or pelvis. 2. Right ovarian dermoid slightly larger than prior examinations, currently measuring 3.8 x 2.8 x 3.1 cm. 3. Additional incidental findings, as above.   07/11/2018 Imaging   No findings suspicious for recurrent lymphoma. Spleen is normal in size.  Stable right ovarian dermoid.  4 mm subpleural nodular opacity in the posterior right lower lobe, favoring subpleural atelectasis.   07/17/2019 Imaging   1. Stable exam. No new or progressive findings to suggest recurrent disease. 2. Stable right adnexal dermoid. 3. Emphysema (ICD10-J43.9).   09/19/2019 Procedure   Successful right IJ vein Port-A-Cath explant.   12/05/2019 Imaging   1. Stable exam. No findings to suggest recurrent lymphoma. 2. 9 mm left para-aortic node is upper normal for size and mildly increased from 7 mm previously. Attention on follow-up recommended.  3. Stable right adnexal dermoid. 4. Emphysema (ICD10-J43.9).       REVIEW OF SYSTEMS:   Constitutional: Denies fevers, chills or abnormal weight loss Eyes: Denies blurriness of vision Ears, nose, mouth, throat, and face: Denies mucositis or sore throat Respiratory: Denies cough, dyspnea or wheezes Cardiovascular: Denies palpitation, chest discomfort or lower extremity swelling Gastrointestinal:  Denies nausea, heartburn or change in bowel habits Lymphatics: Denies new lymphadenopathy or easy bruising Neurological:Denies numbness, tingling or new weaknesses Behavioral/Psych: Mood is stable, no new changes  All other systems were reviewed with the patient and are negative.  I have reviewed the past medical history, past surgical history, social history and family history with the patient and they are unchanged from previous note.  ALLERGIES:  is allergic to temazepam.  MEDICATIONS:  Current Outpatient Medications  Medication Sig Dispense Refill  . atazanavir (REYATAZ) 300 MG capsule TAKE ONE CAPSULE BY  MOUTH DAILY WITH BREAKFAST. TAKE WITH NORVIR 90 capsule 1  . cephALEXin (KEFLEX) 500 MG capsule Take 1 capsule (500 mg total) by mouth 2 (two) times daily. 10 capsule 0  . cholecalciferol (VITAMIN D) 1000 units tablet Take 1 tablet (1,000 Units total) by mouth daily. 30 tablet 9  . emtricitabine-tenofovir (TRUVADA) 200-300 MG tablet Take 1 tablet by mouth daily. 90 tablet 1  . lidocaine (XYLOCAINE) 5 % ointment Apply 1 application topically as needed. Will numb skin for cleaning and urination 30 g 0  . ritonavir (NORVIR) 100 MG TABS tablet TAKE 1 TABLET BY MOUTH EVERY MORNING WITH BREAKFAST. TAKE WITH REYATAZ 90 tablet 1  . traMADol (ULTRAM) 50 MG tablet Take 2 tablets (100 mg total) by mouth every 6 (six) hours as needed. 90 tablet 0   No current facility-administered medications for this visit.    PHYSICAL EXAMINATION: ECOG PERFORMANCE STATUS: 0 - Asymptomatic  Vitals:   12/06/19 0932  BP: 111/63  Pulse: (!) 56  Resp: 18  Temp: 98.1 F (36.7 C)  SpO2: 100%   Filed Weights   12/06/19 0932  Weight: 102 lb 9.6 oz (46.5 kg)    GENERAL:alert, no distress and comfortable NEURO: alert & oriented x 3 with fluent speech, no focal motor/sensory deficits  LABORATORY DATA:  I have reviewed the data as listed    Component Value Date/Time   NA 139 11/07/2019 0740   NA 140 04/29/2017 0745   K 3.7 11/07/2019 0740   K 3.3 (L) 04/29/2017 0745   CL 106  11/07/2019 0740   CO2 24 11/07/2019 0740   CO2 23 04/29/2017 0745   GLUCOSE 84 11/07/2019 0740   GLUCOSE 83 04/29/2017 0745   BUN 13 11/07/2019 0740   BUN 10.7 04/29/2017 0745   CREATININE 1.11 (H) 11/07/2019 0740   CREATININE 1.2 (H) 04/29/2017 0745   CALCIUM 9.3 11/07/2019 0740   CALCIUM 9.8 04/29/2017 0745   PROT 6.9 11/07/2019 0740   PROT 7.6 04/29/2017 0745   ALBUMIN 4.0 11/07/2019 0740   ALBUMIN 4.0 04/29/2017 0745   AST 16 11/07/2019 0740   AST 19 04/29/2017 0745   ALT 15 11/07/2019 0740   ALT 19 04/29/2017 0745    ALKPHOS 80 11/07/2019 0740   ALKPHOS 81 04/29/2017 0745   BILITOT 0.8 11/07/2019 0740   BILITOT 1.31 (H) 04/29/2017 0745   GFRNONAA >60 11/07/2019 0740   GFRNONAA 83 02/20/2016 0908   GFRAA >60 11/07/2019 0740   GFRAA >89 02/20/2016 0908    No results found for: SPEP, UPEP  Lab Results  Component Value Date   WBC 5.8 11/07/2019   NEUTROABS 3.3 11/07/2019   HGB 14.1 11/07/2019   HCT 39.9 11/07/2019   MCV 96.1 11/07/2019   PLT 256 11/07/2019      Chemistry      Component Value Date/Time   NA 139 11/07/2019 0740   NA 140 04/29/2017 0745   K 3.7 11/07/2019 0740   K 3.3 (L) 04/29/2017 0745   CL 106 11/07/2019 0740   CO2 24 11/07/2019 0740   CO2 23 04/29/2017 0745   BUN 13 11/07/2019 0740   BUN 10.7 04/29/2017 0745   CREATININE 1.11 (H) 11/07/2019 0740   CREATININE 1.2 (H) 04/29/2017 0745      Component Value Date/Time   CALCIUM 9.3 11/07/2019 0740   CALCIUM 9.8 04/29/2017 0745   ALKPHOS 80 11/07/2019 0740   ALKPHOS 81 04/29/2017 0745   AST 16 11/07/2019 0740   AST 19 04/29/2017 0745   ALT 15 11/07/2019 0740   ALT 19 04/29/2017 0745   BILITOT 0.8 11/07/2019 0740   BILITOT 1.31 (H) 04/29/2017 0745       RADIOGRAPHIC STUDIES: I have reviewed imaging study with the patient I have personally reviewed the radiological images as listed and agreed with the findings in the report. CT CHEST W CONTRAST  Result Date: 12/05/2019 CLINICAL DATA:  History of Hodgkin lymphoma.  Restaging. EXAM: CT CHEST, ABDOMEN, AND PELVIS WITH CONTRAST TECHNIQUE: Multidetector CT imaging of the chest, abdomen and pelvis was performed following the standard protocol during bolus administration of intravenous contrast. CONTRAST:  82m OMNIPAQUE IOHEXOL 300 MG/ML  SOLN COMPARISON:  07/17/2019 FINDINGS: CT CHEST FINDINGS Cardiovascular: The heart size is normal. No substantial pericardial effusion. No thoracic aortic aneurysm. Mediastinum/Nodes: Soft tissue attenuation in the anterior mediastinum is  stable. No mediastinal lymphadenopathy. There is no hilar lymphadenopathy. The esophagus has normal imaging features. There is no axillary lymphadenopathy. Lungs/Pleura: Paraseptal emphysema again noted. Tiny subpleural right middle lobe nodule is stable on image 82/4. Previously described tiny posterior right lower lobe subpleural nodule has resolved in the interval. No new suspicious nodule or mass. No focal airspace consolidation. No pleural effusion. Musculoskeletal: No worrisome lytic or sclerotic osseous abnormality. CT ABDOMEN PELVIS FINDINGS Hepatobiliary: No suspicious focal abnormality within the liver parenchyma. There is no evidence for gallstones, gallbladder wall thickening, or pericholecystic fluid. No intrahepatic or extrahepatic biliary dilation. Pancreas: No focal mass lesion. No dilatation of the main duct. No intraparenchymal cyst. No peripancreatic edema. Spleen:  No splenomegaly. No focal mass lesion. Adrenals/Urinary Tract: No adrenal nodule or mass. Kidneys unremarkable. No evidence for hydroureter. The urinary bladder appears normal for the degree of distention. Stomach/Bowel: Stomach is unremarkable. No gastric wall thickening. No evidence of outlet obstruction. Duodenum is normally positioned as is the ligament of Treitz. No small bowel wall thickening. No small bowel dilatation. The terminal ileum is normal. The appendix is not visualized, but there is no edema or inflammation in the region of the cecum. No gross colonic mass. No colonic wall thickening. Vascular/Lymphatic: No abdominal aortic aneurysm. No abdominal aortic atherosclerotic calcification. There is no gastrohepatic or hepatoduodenal ligament lymphadenopathy. No retroperitoneal or mesenteric lymphadenopathy. 9 mm left para-aortic node is upper normal for size (image 77/2) and mildly increased from 7 mm previously. No pelvic sidewall lymphadenopathy. Reproductive: The uterus is unremarkable. Previously described right adnexal  dermoid is stable. No left adnexal mass. Other: No intraperitoneal free fluid. Musculoskeletal: No worrisome lytic or sclerotic osseous abnormality. IMPRESSION: 1. Stable exam. No findings to suggest recurrent lymphoma. 2. 9 mm left para-aortic node is upper normal for size and mildly increased from 7 mm previously. Attention on follow-up recommended. 3. Stable right adnexal dermoid. 4. Emphysema (ICD10-J43.9). Electronically Signed   By: Misty Stanley M.D.   On: 12/05/2019 13:44   CT ABDOMEN PELVIS W CONTRAST  Result Date: 12/05/2019 CLINICAL DATA:  History of Hodgkin lymphoma.  Restaging. EXAM: CT CHEST, ABDOMEN, AND PELVIS WITH CONTRAST TECHNIQUE: Multidetector CT imaging of the chest, abdomen and pelvis was performed following the standard protocol during bolus administration of intravenous contrast. CONTRAST:  28m OMNIPAQUE IOHEXOL 300 MG/ML  SOLN COMPARISON:  07/17/2019 FINDINGS: CT CHEST FINDINGS Cardiovascular: The heart size is normal. No substantial pericardial effusion. No thoracic aortic aneurysm. Mediastinum/Nodes: Soft tissue attenuation in the anterior mediastinum is stable. No mediastinal lymphadenopathy. There is no hilar lymphadenopathy. The esophagus has normal imaging features. There is no axillary lymphadenopathy. Lungs/Pleura: Paraseptal emphysema again noted. Tiny subpleural right middle lobe nodule is stable on image 82/4. Previously described tiny posterior right lower lobe subpleural nodule has resolved in the interval. No new suspicious nodule or mass. No focal airspace consolidation. No pleural effusion. Musculoskeletal: No worrisome lytic or sclerotic osseous abnormality. CT ABDOMEN PELVIS FINDINGS Hepatobiliary: No suspicious focal abnormality within the liver parenchyma. There is no evidence for gallstones, gallbladder wall thickening, or pericholecystic fluid. No intrahepatic or extrahepatic biliary dilation. Pancreas: No focal mass lesion. No dilatation of the main duct. No  intraparenchymal cyst. No peripancreatic edema. Spleen: No splenomegaly. No focal mass lesion. Adrenals/Urinary Tract: No adrenal nodule or mass. Kidneys unremarkable. No evidence for hydroureter. The urinary bladder appears normal for the degree of distention. Stomach/Bowel: Stomach is unremarkable. No gastric wall thickening. No evidence of outlet obstruction. Duodenum is normally positioned as is the ligament of Treitz. No small bowel wall thickening. No small bowel dilatation. The terminal ileum is normal. The appendix is not visualized, but there is no edema or inflammation in the region of the cecum. No gross colonic mass. No colonic wall thickening. Vascular/Lymphatic: No abdominal aortic aneurysm. No abdominal aortic atherosclerotic calcification. There is no gastrohepatic or hepatoduodenal ligament lymphadenopathy. No retroperitoneal or mesenteric lymphadenopathy. 9 mm left para-aortic node is upper normal for size (image 77/2) and mildly increased from 7 mm previously. No pelvic sidewall lymphadenopathy. Reproductive: The uterus is unremarkable. Previously described right adnexal dermoid is stable. No left adnexal mass. Other: No intraperitoneal free fluid. Musculoskeletal: No worrisome lytic or sclerotic osseous abnormality. IMPRESSION:  1. Stable exam. No findings to suggest recurrent lymphoma. 2. 9 mm left para-aortic node is upper normal for size and mildly increased from 7 mm previously. Attention on follow-up recommended. 3. Stable right adnexal dermoid. 4. Emphysema (ICD10-J43.9). Electronically Signed   By: Misty Stanley M.D.   On: 12/05/2019 13:44

## 2019-12-07 ENCOUNTER — Other Ambulatory Visit: Payer: Self-pay | Admitting: Hematology and Oncology

## 2019-12-07 ENCOUNTER — Telehealth: Payer: Self-pay

## 2019-12-07 NOTE — Telephone Encounter (Signed)
She called and requested a refill on Tramadol Rx. 

## 2019-12-07 NOTE — Telephone Encounter (Signed)
Attempted to call and tell her Rx sent. No voicemail.

## 2019-12-15 ENCOUNTER — Other Ambulatory Visit: Payer: Self-pay | Admitting: Hematology and Oncology

## 2019-12-15 DIAGNOSIS — B20 Human immunodeficiency virus [HIV] disease: Secondary | ICD-10-CM

## 2019-12-18 ENCOUNTER — Other Ambulatory Visit: Payer: Self-pay | Admitting: Hematology and Oncology

## 2019-12-18 DIAGNOSIS — B20 Human immunodeficiency virus [HIV] disease: Secondary | ICD-10-CM

## 2019-12-28 ENCOUNTER — Other Ambulatory Visit: Payer: Self-pay | Admitting: Hematology and Oncology

## 2019-12-28 ENCOUNTER — Other Ambulatory Visit: Payer: Self-pay | Admitting: Internal Medicine

## 2019-12-28 ENCOUNTER — Telehealth: Payer: Self-pay

## 2019-12-28 DIAGNOSIS — B20 Human immunodeficiency virus [HIV] disease: Secondary | ICD-10-CM

## 2019-12-28 MED ORDER — TRAMADOL HCL 50 MG PO TABS: ORAL_TABLET | ORAL | 0 refills | Status: DC

## 2019-12-28 NOTE — Telephone Encounter (Signed)
Called pt to inform that medication has been sent in by Dr Alvy Bimler to [facility] on [address] LVM.

## 2020-01-04 ENCOUNTER — Ambulatory Visit (HOSPITAL_COMMUNITY)
Admission: EM | Admit: 2020-01-04 | Discharge: 2020-01-04 | Disposition: A | Discharge status: Home / Self Care | Payer: 59 | Attending: Family Medicine | Admitting: Family Medicine

## 2020-01-04 ENCOUNTER — Other Ambulatory Visit: Payer: Self-pay

## 2020-01-04 ENCOUNTER — Encounter (HOSPITAL_COMMUNITY): Payer: Self-pay | Admitting: Emergency Medicine

## 2020-01-04 DIAGNOSIS — J020 Streptococcal pharyngitis: Secondary | ICD-10-CM

## 2020-01-04 DIAGNOSIS — J029 Acute pharyngitis, unspecified: Secondary | ICD-10-CM | POA: Diagnosis not present

## 2020-01-04 LAB — POCT RAPID STREP A, ED / UC: Streptococcus, Group A Screen (Direct): POSITIVE — AB

## 2020-01-04 MED ORDER — DEXAMETHASONE SODIUM PHOSPHATE 10 MG/ML IJ SOLN: 10.0000 mg | Freq: Once | INTRAMUSCULAR | Status: DC

## 2020-01-04 MED ORDER — AMOXICILLIN-POT CLAVULANATE 875-125 MG PO TABS
1.0000 | ORAL_TABLET | Freq: Two times a day (BID) | ORAL | 0 refills | Status: DC
Start: 2020-01-04 — End: 2020-06-20

## 2020-01-04 NOTE — ED Triage Notes (Addendum)
Right tonsil swelling noticed yesterday, not so much the tonsil as a swollen lymph node in right neck  Denies runny nose, loss of taste or smell No fever

## 2020-01-04 NOTE — Discharge Instructions (Addendum)
Your strep test was positive Take the medicine as prescribed Steroid injection given here for tonsillar swelling.  Follow up as needed for continued or worsening symptoms

## 2020-01-08 ENCOUNTER — Other Ambulatory Visit: Payer: Self-pay | Admitting: Hematology and Oncology

## 2020-01-08 ENCOUNTER — Telehealth: Payer: Self-pay

## 2020-01-08 DIAGNOSIS — B20 Human immunodeficiency virus [HIV] disease: Secondary | ICD-10-CM

## 2020-01-08 NOTE — Telephone Encounter (Signed)
done

## 2020-01-08 NOTE — Telephone Encounter (Signed)
Called and left a message that Rx sent. Ask her to call the office for questions.

## 2020-01-08 NOTE — Telephone Encounter (Signed)
She called and requested a refill on Tramadol Rx. 
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
## 2020-01-17 ENCOUNTER — Other Ambulatory Visit: Payer: Self-pay | Admitting: Hematology and Oncology

## 2020-01-17 ENCOUNTER — Telehealth: Payer: Self-pay

## 2020-01-17 DIAGNOSIS — L299 Pruritus, unspecified: Secondary | ICD-10-CM

## 2020-01-17 DIAGNOSIS — B20 Human immunodeficiency virus [HIV] disease: Secondary | ICD-10-CM

## 2020-01-17 MED ORDER — HYDROXYZINE HCL 10 MG PO TABS
10.0000 mg | ORAL_TABLET | Freq: Three times a day (TID) | ORAL | 0 refills | Status: DC | PRN
Start: 2020-01-17 — End: 2020-10-01

## 2020-01-17 NOTE — Telephone Encounter (Signed)
Done I also go a tramadol refill from pharmacy, not due for refill yet so I did not sign that order yet

## 2020-01-17 NOTE — Telephone Encounter (Signed)
Called and left below message that Rx sent for Hydroxyzine. Ask her to call Friday about Tramadol Rx refill and call the office for questions.

## 2020-01-17 NOTE — Telephone Encounter (Signed)
She called and left a message requesting refill on Hydroxyzine 10 mg for itching. Rx not on medication list.

## 2020-01-30 ENCOUNTER — Telehealth: Payer: Self-pay

## 2020-01-30 ENCOUNTER — Other Ambulatory Visit: Payer: Self-pay | Admitting: Hematology and Oncology

## 2020-01-30 DIAGNOSIS — B20 Human immunodeficiency virus [HIV] disease: Secondary | ICD-10-CM

## 2020-01-30 MED ORDER — TRAMADOL HCL 50 MG PO TABS: 100.0000 mg | ORAL_TABLET | Freq: Four times a day (QID) | ORAL | 0 refills | Status: DC | PRN

## 2020-01-30 NOTE — Telephone Encounter (Signed)
Pt called stating she needs a refill on Tramadol. This nurse called to inform pt that this has already been filled by Dr Alvy Bimler today. Pt verbalized thanks and understanding.

## 2020-01-30 NOTE — Telephone Encounter (Signed)
Tramadol refilled.

## 2020-02-07 ENCOUNTER — Ambulatory Visit: Payer: 59 | Admitting: Hematology and Oncology

## 2020-02-07 ENCOUNTER — Other Ambulatory Visit: Payer: 59

## 2020-02-09 ENCOUNTER — Other Ambulatory Visit: Payer: Self-pay | Admitting: Hematology and Oncology

## 2020-02-09 DIAGNOSIS — B20 Human immunodeficiency virus [HIV] disease: Secondary | ICD-10-CM

## 2020-02-09 MED ORDER — TRAMADOL HCL 50 MG PO TABS: 100.0000 mg | ORAL_TABLET | Freq: Four times a day (QID) | ORAL | 0 refills | Status: DC | PRN

## 2020-02-14 ENCOUNTER — Ambulatory Visit: Payer: 59 | Admitting: Internal Medicine

## 2020-02-19 ENCOUNTER — Telehealth: Payer: Self-pay

## 2020-02-19 NOTE — Telephone Encounter (Signed)
She called and requested a refill on Tramadol Rx. 

## 2020-02-19 NOTE — Telephone Encounter (Signed)
Called and left a message that Dr. Alvy Bimler will fill Tramadol Rx tomorrow.

## 2020-02-19 NOTE — Telephone Encounter (Signed)
Not sure until tomorrow I will refill first thing tomorrow morning

## 2020-02-20 ENCOUNTER — Other Ambulatory Visit: Payer: Self-pay | Admitting: Hematology and Oncology

## 2020-02-20 DIAGNOSIS — B20 Human immunodeficiency virus [HIV] disease: Secondary | ICD-10-CM

## 2020-02-20 MED ORDER — TRAMADOL HCL 50 MG PO TABS
100.0000 mg | ORAL_TABLET | Freq: Four times a day (QID) | ORAL | 0 refills | Status: DC | PRN
Start: 2020-02-20 — End: 2020-03-01

## 2020-03-01 ENCOUNTER — Other Ambulatory Visit: Payer: Self-pay | Admitting: Hematology and Oncology

## 2020-03-01 ENCOUNTER — Telehealth: Payer: Self-pay

## 2020-03-01 DIAGNOSIS — B20 Human immunodeficiency virus [HIV] disease: Secondary | ICD-10-CM

## 2020-03-01 MED ORDER — TRAMADOL HCL 50 MG PO TABS: 100.0000 mg | ORAL_TABLET | Freq: Four times a day (QID) | ORAL | 0 refills | Status: DC | PRN

## 2020-03-01 NOTE — Telephone Encounter (Signed)
done

## 2020-03-01 NOTE — Telephone Encounter (Signed)
Called and given below message. She verbalzied understanding. 

## 2020-03-01 NOTE — Telephone Encounter (Signed)
Called and left a message requesting refill on Tramadol Rx. She will run out over the weekend.

## 2020-03-11 ENCOUNTER — Telehealth: Payer: Self-pay

## 2020-03-11 ENCOUNTER — Other Ambulatory Visit: Payer: Self-pay | Admitting: Hematology and Oncology

## 2020-03-11 DIAGNOSIS — B20 Human immunodeficiency virus [HIV] disease: Secondary | ICD-10-CM

## 2020-03-11 MED ORDER — TRAMADOL HCL 50 MG PO TABS
100.0000 mg | ORAL_TABLET | Freq: Four times a day (QID) | ORAL | 0 refills | Status: DC | PRN
Start: 2020-03-11 — End: 2020-03-25

## 2020-03-11 NOTE — Telephone Encounter (Signed)
done

## 2020-03-11 NOTE — Telephone Encounter (Signed)
She called and requested a refill on Tramadol Rx. 

## 2020-03-11 NOTE — Telephone Encounter (Signed)
Called and left a message Rx sent. Ask her to call for questions.

## 2020-03-22 ENCOUNTER — Telehealth: Payer: Self-pay | Admitting: *Deleted

## 2020-03-22 ENCOUNTER — Other Ambulatory Visit: Payer: Self-pay | Admitting: Hematology and Oncology

## 2020-03-22 NOTE — Telephone Encounter (Addendum)
Received call from pt asking for refill on her tramadol.  She reports being out but will p/u on Sunday b/c insurance won't allow until then.  Message to Dr Richarda Blade for ok to refill. Returned call to pt & informed that Dr Alvy Bimler said she would refill pain med first thing Monday AM.  Pt expressed appreciation.

## 2020-03-22 NOTE — Telephone Encounter (Signed)
Unable to refill I will refill first thing in the morning on Monday

## 2020-03-25 ENCOUNTER — Other Ambulatory Visit: Payer: Self-pay | Admitting: Hematology and Oncology

## 2020-03-25 DIAGNOSIS — B20 Human immunodeficiency virus [HIV] disease: Secondary | ICD-10-CM

## 2020-03-25 MED ORDER — TRAMADOL HCL 50 MG PO TABS: 100.0000 mg | ORAL_TABLET | Freq: Four times a day (QID) | ORAL | 0 refills | Status: DC | PRN

## 2020-04-04 ENCOUNTER — Other Ambulatory Visit: Payer: Self-pay | Admitting: Hematology and Oncology

## 2020-04-04 DIAGNOSIS — B20 Human immunodeficiency virus [HIV] disease: Secondary | ICD-10-CM

## 2020-04-07 ENCOUNTER — Other Ambulatory Visit: Payer: Self-pay | Admitting: Internal Medicine

## 2020-04-07 DIAGNOSIS — B20 Human immunodeficiency virus [HIV] disease: Secondary | ICD-10-CM

## 2020-04-08 ENCOUNTER — Other Ambulatory Visit: Payer: Self-pay

## 2020-04-08 ENCOUNTER — Inpatient Hospital Stay: Payer: 59

## 2020-04-08 ENCOUNTER — Encounter: Payer: Self-pay | Admitting: Hematology and Oncology

## 2020-04-08 ENCOUNTER — Inpatient Hospital Stay: Payer: 59 | Attending: Hematology and Oncology | Admitting: Hematology and Oncology

## 2020-04-08 VITALS — BP 113/85 | HR 70 | Temp 98.0°F | Resp 18 | Ht 64.0 in | Wt 103.6 lb

## 2020-04-08 DIAGNOSIS — C8118 Nodular sclerosis classical Hodgkin lymphoma, lymph nodes of multiple sites: Secondary | ICD-10-CM | POA: Diagnosis present

## 2020-04-08 DIAGNOSIS — G8929 Other chronic pain: Secondary | ICD-10-CM

## 2020-04-08 DIAGNOSIS — Z299 Encounter for prophylactic measures, unspecified: Secondary | ICD-10-CM | POA: Insufficient documentation

## 2020-04-08 DIAGNOSIS — F17210 Nicotine dependence, cigarettes, uncomplicated: Secondary | ICD-10-CM | POA: Diagnosis not present

## 2020-04-08 DIAGNOSIS — M2550 Pain in unspecified joint: Secondary | ICD-10-CM

## 2020-04-08 DIAGNOSIS — C8198 Hodgkin lymphoma, unspecified, lymph nodes of multiple sites: Secondary | ICD-10-CM

## 2020-04-08 DIAGNOSIS — Z5111 Encounter for antineoplastic chemotherapy: Secondary | ICD-10-CM

## 2020-04-08 LAB — COMPREHENSIVE METABOLIC PANEL
ALT: 21 U/L (ref 0–44)
AST: 22 U/L (ref 15–41)
Albumin: 4.1 g/dL (ref 3.5–5.0)
Alkaline Phosphatase: 86 U/L (ref 38–126)
Anion gap: 7 (ref 5–15)
BUN: 8 mg/dL (ref 6–20)
CO2: 27 mmol/L (ref 22–32)
Calcium: 9.8 mg/dL (ref 8.9–10.3)
Chloride: 106 mmol/L (ref 98–111)
Creatinine, Ser: 1 mg/dL (ref 0.44–1.00)
GFR, Estimated: >60 mL/min (ref >60)
Glucose, Bld: 90 mg/dL (ref 70–99)
Potassium: 3.9 mmol/L (ref 3.5–5.1)
Sodium: 140 mmol/L (ref 135–145)
Total Bilirubin: 1.4 mg/dL — ABNORMAL HIGH (ref 0.3–1.2)
Total Protein: 7.3 g/dL (ref 6.5–8.1)

## 2020-04-08 LAB — CBC WITH DIFFERENTIAL/PLATELET
Abs Immature Granulocytes: 0.01 10*3/uL (ref 0.00–0.07)
Basophils Absolute: 0.1 10*3/uL (ref 0.0–0.1)
Basophils Relative: 1 %
Eosinophils Absolute: 0.2 10*3/uL (ref 0.0–0.5)
Eosinophils Relative: 2 %
HCT: 41.4 % (ref 36.0–46.0)
Hemoglobin: 14.4 g/dL (ref 12.0–15.0)
Immature Granulocytes: 0 %
Lymphocytes Relative: 29 %
Lymphs Abs: 2.1 10*3/uL (ref 0.7–4.0)
MCH: 34.2 pg — ABNORMAL HIGH (ref 26.0–34.0)
MCHC: 34.8 g/dL (ref 30.0–36.0)
MCV: 98.3 fL (ref 80.0–100.0)
Monocytes Absolute: 0.7 10*3/uL (ref 0.1–1.0)
Monocytes Relative: 9 %
Neutro Abs: 4.2 10*3/uL (ref 1.7–7.7)
Neutrophils Relative %: 59 %
Platelets: 270 10*3/uL (ref 150–400)
RBC: 4.21 MIL/uL (ref 3.87–5.11)
RDW: 12.1 % (ref 11.5–15.5)
WBC: 7.2 10*3/uL (ref 4.0–10.5)
nRBC: 0 % (ref 0.0–0.2)

## 2020-04-08 MED ORDER — EMTRICITABINE-TENOFOVIR DF 200-300 MG PO TABS: 1.0000 | ORAL_TABLET | Freq: Every day | ORAL | 1 refills | Status: DC

## 2020-04-08 NOTE — Assessment & Plan Note (Signed)
She has multiple chronic joint pain and is dependent on tramadol to control her joint pain She is taking vitamin D supplement as prescribed We discussed refill policy.

## 2020-04-08 NOTE — Assessment & Plan Note (Signed)
Clinically, her exam is benign She has no signs of cancer I will see her back in 3 months for further follow-up Due to high risk nature of her disease, I plan to repeat CT imaging again in 3 months

## 2020-04-08 NOTE — Assessment & Plan Note (Signed)
She is overdue for influenza vaccination I recommend influenza vaccination today but the patient declined

## 2020-04-08 NOTE — Progress Notes (Signed)
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 We discussed common tobacco cessation strategies She is currently not interested to quit now.   Chronic joint pain She has multiple chronic joint pain and is dependent on tramadol to control her joint pain She is taking vitamin D supplement as prescribed We discussed refill policy.  Preventive measure She is overdue for influenza vaccination I recommend influenza vaccination today but the patient declined   Orders Placed This Encounter  Procedures  . CT CHEST ABDOMEN PELVIS W CONTRAST    Standing Status:   Future    Standing Expiration Date:   04/08/2021    Order Specific Question:   Preferred imaging location?    Answer:   [facility]    Order Specific Question:   Radiology Contrast Protocol - do NOT remove file path    Answer:   [email or web address]    Order Specific Question:   Is patient pregnant?    Answer:   No  . VITAMIN D 25 Hydroxy (Vit-D Deficiency, Fractures)    Standing Status:   Future    Standing Expiration Date:   04/08/2021  . Comprehensive metabolic panel    Standing Status:   Standing    Number of Occurrences:   33    Standing Expiration Date:   04/08/2021  .  CBC with Differential/Platelet    Standing Status:   Standing    Number of Occurrences:   22    Standing Expiration Date:   04/08/2021    All questions were answered. The patient knows to call the clinic with any problems, questions or concerns. The total time spent in the appointment was 20 minutes encounter with patients including review of chart and various tests results, discussions about plan of care and coordination of care plan   Heath Lark, MD 04/08/2020 9:16 AM  INTERVAL HISTORY: Please see below for problem oriented charting. 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   05/07/2014 Imaging   CT scan of the chest show right thoracic inlet lymphadenopathy   06/07/2014 Procedure   She underwent ultrasound-guided core biopsy of the neck lymph node   06/07/2014 Pathology Results   Accession: [id number] biopsy confirmed diagnosis of Hodgkin lymphoma.   06/15/2014 Imaging   Echocardiogram showed preserved ejection fraction   07/09/2014 - 07/12/2014 Hospital Admission   She was admitted to the hospital for severe anemia.   07/27/2014 Procedure   She had placement of port   07/31/2014 - 09/11/2014  Chemotherapy   She received dose adjusted chemotherapy due to abnormal liver function tests and severe anemia. Treatment was delayed due to noncompliance  and subsequently stopped because the patient failed to keep appointments   01/11/2015 Imaging   Repeat PET CT scan showed response to treatment   01/28/2015 - 06/18/2015 Chemotherapy   ABVD was restarted with full dose.   02/08/2015 -  02/10/2015 Hospital Admission   The patient was admitted to the hospital due to pancytopenia and profuse diarrhea. Cultures were negative. She was placed on ciprofloxacin.   02/11/2015 Adverse Reaction   Treatment was placed on hold due to recent infection.   04/11/2015 Imaging   PET CT scan showed near complete response. Incidental finding of an abnormal bone lesion, indeterminate. She is not symptomatic. Recommendation from [facility] to observe   07/11/2015 Imaging   PET CT scan showed abnormal new bone lesions, suggestive of possible disease progression   07/23/2015 Bone Marrow Biopsy   She underwent bone biopsy   07/23/2015 Pathology Results   Accession: [id number]  biopsy was negative for cancer   11/21/2015 Surgery   She had surgery for ectopic pregnancy   01/29/2016 Imaging   Ct chest, abdomen and pelvis showed pelvic and retroperitoneal lymphadenopathy, as above, concerning for residual disease. There is also a mildly enlarged posterior mediastinal lymph node measuring 11 mm adjacent to the distal descending thoracic aorta. This may represent an additional focus of disease, but is the only finding of concern in the thorax on today's examination. Sclerosis in the right ilium at site of previously noted metabolically active lesion, grossly unchanged. No other definite osseous lesions are identified on today's examination. Spleen is normal in size and appearance.   02/14/2016 PET scan   Interval disease worsening with new foci of hypermetabolic activity in multiple retroperitoneal and pelvic lymph nodes as well as AP window and left hilar lymph nodes. (Deauville 5). There is also overall worsening of the osseous disease.   05/11/2016 Pathology Results   Diagnosis Lymph node, needle/core biopsy, Left para-aortic retroperitoneal - CLASSICAL HODGKIN LYMPHOMA. - SEE ONCOLOGY TABLE. Microscopic Comment LYMPHOMA Histologic type: Classical Hodgkin lymphoma. Grade (if applicable): N/A Flow  cytometry: Not done. Immunohistochemical stains: CD15, CD20, CD3, LCA, PAX-5, CD30 with appropriate controls. Touch preps/imprints: Not performed. Comments: The sections show small needle core biopsy fragments displaying a polymorphous cellular proliferation of small lymphocytes, plasma cells, eosinophils, and large atypical mononuclear and multilobated lymphoid cells with features of Reed-Sternberg cells and variants. This is associated with patchy areas of fibrosis. Immunohistochemical stains were performed and show that the large atypical lymphoid cells are positive for CD30, CD15 and PAX-5 and negative for LCA, CD20, CD3. The small lymphoid cells in the background show a mixture of T and B cells with predominance of T cells. The overall morphologic and histologic features are consistent with classical Hodgkin lymphoma. Further subtyping is challenging in limited small biopsy fragments but the patchy fibrosis suggests nodular sclerosis type.    05/11/2016 Procedure   She underwent CT guided biopsy of retroperitoneal lymph node   05/26/2016 Procedure   Successful placement of a right IJ approach Power Port with ultrasound and fluoroscopic guidance. The catheter is ready for use.   06/02/2016 PET scan   Mixed response to chemotherapy with some lymph nodes decreased in metabolic activity and some lymph nodes increase metabolic activity. Lymph node stations including mediastinum, periaortic retroperitoneum, and obturator node stations. Activity is remains relatively intense Deauville 4 2. LEFT infrahilar nodule /  lymph node with intense metabolic activity decreased from prior. ( Deauville 4 ). 3. 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 Deauville 5   06/03/2016 - 06/05/2016 Hospital Admission   She was admitted to the hospital for cycle 1 of ICE chemotherapy   06/24/2016 - 06/26/2016 Hospital Admission   She received cycle 2 of ICE chemo   07/07/2016 PET scan   Resolution of  prior hypermetabolic adenopathy and resolution of prior osseous foci of hypermetabolic activity compatible with essentially complete response to therapy (Deauville 1). 2. Generalized reduced activity in the L4 vertebral body and the type of finding which would typically reflect prior radiation therapy. 3. Stable septated fatty right pelvic lesion, possibly a dermoid or lipoma, not hypermetabolic   10/12/2016 PET scan   Hypermetabolic lesion along the L3 vertebral body and left posterior elements, max SUV 7.8 (Deauville 5). Hypermetabolic lesion along the left inferior pubic ramus, max SUV 4.9 (Deauville 4).  IMPRESSION: Prevascular lymphadenopathy, reflecting nodal recurrence (Deauville 4). Hypermetabolic osseous metastases involving the L3 vertebral body/posterior elements and left inferior pubic ramus (Deauville 4-5). Hypermetabolism along the endometrium, new, possibly reactive/physiologic. Consider pelvic ultrasound and/or endometrial sampling as clinically warranted.   11/04/2016 - 02/24/2017 Chemotherapy   She received Brentuximab   12/11/2016 PET scan   1. Mixed response to therapy within the skeleton. Lesions at L3 is decreased in size and metabolic activity. Residual activity remains above liver ( Deauville 4) 2. Increased activity in the LEFT sacrum with metabolic activity above liver activity ( Deauville 4 3. Decrease in size and metabolic activity of anterior mediastinal tissue consistent with resolution of thymic hyperplasia or resolution of lymphoma metabolic activity ( Deauville 2). 4. No new lymphadenopathy. Normal spleen and liver.   03/25/2017 PET scan   1. Increased metabolic activity in several normal sized retroperitoneal and right pelvic lymph nodes, Deauville 4. 2. Increase in size and metabolic activity of lesions in the L3 and L4 vertebral bodies and in the left sacrum, Deauville 5. 3. No recurrence of anterior mediastinal abnormal activity. 4. Other imaging findings of  potential clinical significance: Chronic ethmoid and left maxillary sinusitis. Paraseptal emphysema at the lung apices. Right ovarian dermoid.   04/06/2017 -  Chemotherapy   The patient had Keytruda   04/06/2017 - 05/23/2019 Chemotherapy   The patient had pembrolizumab (KEYTRUDA) 200 mg in sodium chloride 0.9 % 50 mL chemo infusion, 200 mg, Intravenous, Once, 30 of 31 cycles Administration: 200 mg (04/06/2017), 200 mg (04/29/2017), 200 mg (05/20/2017), 200 mg (06/10/2017), 200 mg (07/01/2017), 200 mg (07/22/2017), 200 mg (08/12/2017), 200 mg (09/02/2017), 200 mg (10/04/2017), 200 mg (11/03/2017), 200 mg (12/02/2017), 200 mg (12/30/2017), 200 mg (01/27/2018), 200 mg (02/24/2018), 200 mg (03/22/2018), 200 mg (04/19/2018), 200 mg (05/17/2018), 200 mg (06/14/2018), 200 mg (07/12/2018), 200 mg (08/09/2018), 200 mg (09/06/2018), 200 mg (10/04/2018), 200 mg (11/01/2018), 200 mg (11/29/2018), 200 mg (12/27/2018), 200 mg (01/24/2019), 200 mg (03/14/2019), 200 mg (05/23/2019)  for chemotherapy treatment.    06/09/2017 PET scan   1. Overall significant improvement with reduction in nodal activity. Several retroperitoneal nodes which were previously Deauville 4 are currently Deauville 3. The right common iliac lymph node remains at Deauville 4 with maximum SUV of 3.4, but is improved from prior SUV of 4.3. 2. The bony lesions show the greatest improvement, with the previously Deauville 5 hypermetabolic lesions currently no longer of higher metabolic activity than surrounding bone marrow, currently measuring at Deauville 3. 3. 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 4. No new lesions are identified. 5. Emphysema (ICD10-J43.9).   08/05/2017 Imaging   1. Somewhat complex left adnexal lesion, potentially hemorrhagic cyst measuring 2.9 x 2.8 cm. The possibility of torsion in the left ovary must be considered. Advise correlation with pelvic ultrasound including Doppler assessment to further  evaluate.  2.  Right ovarian dermoid, unchanged from recent PET-CT examination.  3. No adenopathy by size criteria evident. Appearance of subcentimeter mesenteric lymph nodes is stable compared to recent study.  4. No bone lesions appreciable by CT. Areas of abnormal radiotracer uptake in the mid lumbar spine noted on recent PET-CT examination. No destruction 2 or lytic change noted in the lumbar vertebrae currently there.  5. No bowel obstruction. No abscess. No periappendiceal region inflammation.  6.  Small hiatal hernia with fluid in distal esophagus.  7.  Foci of coronary artery calcification, advanced for age.  8.  Spleen normal in size and contour.  9. Small hiatal hernia with mild fluid in the distal esophagus, likely indicative of a degree of reflux.   01/04/2018 Imaging   1. No findings to suggest residual/recurrent lymphoma in the chest, abdomen or pelvis. 2. Right ovarian dermoid slightly larger than prior examinations, currently measuring 3.8 x 2.8 x 3.1 cm. 3. Additional incidental findings, as above.   07/11/2018 Imaging   No findings suspicious for recurrent lymphoma. Spleen is normal in size.  Stable right ovarian dermoid.  4 mm subpleural nodular opacity in the posterior right lower lobe, favoring subpleural atelectasis.   07/17/2019 Imaging   1. Stable exam. No new or progressive findings to suggest recurrent disease. 2. Stable right adnexal dermoid. 3. Emphysema (ICD10-J43.9).   09/19/2019 Procedure   Successful right IJ vein Port-A-Cath explant.   12/05/2019 Imaging   1. Stable exam. No findings to suggest recurrent lymphoma. 2. 9 mm left para-aortic node is upper normal for size and mildly increased from 7 mm previously. Attention on follow-up recommended.  3. Stable right adnexal dermoid. 4. Emphysema (ICD10-J43.9).       REVIEW OF SYSTEMS:   Constitutional: Denies fevers, chills or abnormal weight loss Eyes: Denies blurriness of  vision Ears, nose, mouth, throat, and face: Denies mucositis or sore throat Respiratory: Denies cough, dyspnea or wheezes Cardiovascular: Denies palpitation, chest discomfort or lower extremity swelling Gastrointestinal:  Denies nausea, heartburn or change in bowel habits Skin: Denies abnormal skin rashes Lymphatics: Denies new lymphadenopathy or easy bruising Neurological:Denies numbness, tingling or new weaknesses Behavioral/Psych: Mood is stable, no new changes  All other systems were reviewed with the patient and are negative.  I have reviewed the past medical history, past surgical history, social history and family history with the patient and they are unchanged from previous note.  ALLERGIES:  is allergic to temazepam.  MEDICATIONS:  Current Outpatient Medications  Medication Sig Dispense Refill  . amoxicillin-clavulanate (AUGMENTIN) 875-125 MG tablet Take 1 tablet by mouth every 12 (twelve) hours. 14 tablet 0  . atazanavir (REYATAZ) 300 MG capsule TAKE ONE CAPSULE BY MOUTH DAILY WITH BREAKFAST. TAKE WITH NORVIR 90 capsule 1  . cholecalciferol (VITAMIN D) 1000 units tablet Take 1 tablet (1,000 Units total) by mouth daily. 30 tablet 9  . emtricitabine-tenofovir (TRUVADA) 200-300 MG tablet Take 1 tablet by mouth daily. 90 tablet 1  . hydrOXYzine (ATARAX/VISTARIL) 10 MG tablet Take 1 tablet (10 mg total) by mouth 3 (three) times daily as needed. 90 tablet 0  . lidocaine (XYLOCAINE) 5 % ointment Apply 1 application topically  as needed. Will numb skin for cleaning and urination 30 g 0  . ritonavir (NORVIR) 100 MG TABS tablet TAKE 1 TABLET BY MOUTH EVERY MORNING WITH BREAKFAST. TAKE WITH REYATAZ 90 tablet 1  . traMADol (ULTRAM) 50 MG tablet TAKE 2 TABLETS(100 MG) BY MOUTH EVERY 6 HOURS AS NEEDED 90 tablet 0   No current facility-administered medications for this visit.    PHYSICAL EXAMINATION: ECOG PERFORMANCE STATUS: 1 - Symptomatic but completely ambulatory  Vitals:   04/08/20  0814  BP: 113/85  Pulse: 70  Resp: 18  Temp: 98 F (36.7 C)  SpO2: 100%   Filed Weights   04/08/20 0814  Weight: 103 lb 9.6 oz (47 kg)    GENERAL:alert, no distress and comfortable SKIN: skin color, texture, turgor are normal, no rashes or significant lesions EYES: normal, Conjunctiva are pink and non-injected, sclera clear OROPHARYNX:no exudate, no erythema and lips, buccal mucosa, and tongue normal  NECK: supple, thyroid normal size, non-tender, without nodularity LYMPH:  no palpable lymphadenopathy in the cervical, axillary or inguinal LUNGS: clear to auscultation and percussion with normal breathing effort HEART: regular rate & rhythm and no murmurs and no lower extremity edema ABDOMEN:abdomen soft, non-tender and normal bowel sounds Musculoskeletal:no cyanosis of digits and no clubbing  NEURO: alert & oriented x 3 with fluent speech, no focal motor/sensory deficits  LABORATORY DATA:  I have reviewed the data as listed    Component Value Date/Time   NA 140 04/08/2020 0755   NA 140 04/29/2017 0745   K 3.9 04/08/2020 0755   K 3.3 (L) 04/29/2017 0745   CL 106 04/08/2020 0755   CO2 27 04/08/2020 0755   CO2 23 04/29/2017 0745   GLUCOSE 90 04/08/2020 0755   GLUCOSE 83 04/29/2017 0745   BUN 8 04/08/2020 0755   BUN 10.7 04/29/2017 0745   CREATININE 1.00 04/08/2020 0755   CREATININE 1.2 (H) 04/29/2017 0745   CALCIUM 9.8 04/08/2020 0755   CALCIUM 9.8 04/29/2017 0745   PROT 7.3 04/08/2020 0755   PROT 7.6 04/29/2017 0745   ALBUMIN 4.1 04/08/2020 0755   ALBUMIN 4.0 04/29/2017 0745   AST 22 04/08/2020 0755   AST 19 04/29/2017 0745   ALT 21 04/08/2020 0755   ALT 19 04/29/2017 0745   ALKPHOS 86 04/08/2020 0755   ALKPHOS 81 04/29/2017 0745   BILITOT 1.4 (H) 04/08/2020 0755   BILITOT 1.31 (H) 04/29/2017 0745   GFRNONAA >60 04/08/2020 0755   GFRNONAA 83 02/20/2016 0908   GFRAA >60 11/07/2019 0740   GFRAA >89 02/20/2016 0908    No results found for: SPEP, UPEP  Lab  Results  Component Value Date   WBC 7.2 04/08/2020   NEUTROABS 4.2 04/08/2020   HGB 14.4 04/08/2020   HCT 41.4 04/08/2020   MCV 98.3 04/08/2020   PLT 270 04/08/2020      Chemistry      Component Value Date/Time   NA 140 04/08/2020 0755   NA 140 04/29/2017 0745   K 3.9 04/08/2020 0755   K 3.3 (L) 04/29/2017 0745   CL 106 04/08/2020 0755   CO2 27 04/08/2020 0755   CO2 23 04/29/2017 0745   BUN 8 04/08/2020 0755   BUN 10.7 04/29/2017 0745   CREATININE 1.00 04/08/2020 0755   CREATININE 1.2 (H) 04/29/2017 0745      Component Value Date/Time   CALCIUM 9.8 04/08/2020 0755   CALCIUM 9.8 04/29/2017 0745   ALKPHOS 86 04/08/2020 0755   ALKPHOS 81 04/29/2017 0745  AST 22 04/08/2020 0755   AST 19 04/29/2017 0745   ALT 21 04/08/2020 0755   ALT 19 04/29/2017 0745   BILITOT 1.4 (H) 04/08/2020 0755   BILITOT 1.31 (H) 04/29/2017 0745

## 2020-04-08 NOTE — Assessment & Plan Note (Signed)
I spent some time counseling the patient the importance of tobacco cessation. We discussed common tobacco cessation strategies She is currently not interested to quit now.

## 2020-04-09 ENCOUNTER — Telehealth: Payer: Self-pay | Admitting: Hematology and Oncology

## 2020-04-09 NOTE — Telephone Encounter (Signed)
Scheduled per 12/13 sch msg. Called and spoke with pt, confirmed 3/7 and 3/8 appts

## 2020-04-14 ENCOUNTER — Ambulatory Visit (HOSPITAL_COMMUNITY)
Admission: EM | Admit: 2020-04-14 | Discharge: 2020-04-14 | Disposition: A | Discharge status: Home / Self Care | Payer: 59 | Attending: Student | Admitting: Student

## 2020-04-14 ENCOUNTER — Encounter (HOSPITAL_COMMUNITY): Payer: Self-pay

## 2020-04-14 DIAGNOSIS — M62830 Muscle spasm of back: Secondary | ICD-10-CM | POA: Diagnosis not present

## 2020-04-14 MED ORDER — METAXALONE 800 MG PO TABS: 400.0000 mg | ORAL_TABLET | Freq: Three times a day (TID) | ORAL | 0 refills | Status: DC

## 2020-04-14 NOTE — Discharge Instructions (Addendum)
For your muscle spasm, try taking the muscle relaxer (Metaxalone). You can also continue your Tramadol if this is helping. Try using heat/ice, and stretching your neck. You can also have a massage if this helps.
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
## 2020-04-14 NOTE — ED Triage Notes (Signed)
Pt in with c/o right side muscle strain her neck and back that has been going on for about 1 week.States that she has been doing repetitive motion with her right arm   Pt has been taking her tramadol with no releief

## 2020-04-16 ENCOUNTER — Telehealth: Payer: Self-pay

## 2020-04-16 ENCOUNTER — Other Ambulatory Visit: Payer: Self-pay | Admitting: Hematology and Oncology

## 2020-04-16 DIAGNOSIS — B20 Human immunodeficiency virus [HIV] disease: Secondary | ICD-10-CM

## 2020-04-16 MED ORDER — TRAMADOL HCL 50 MG PO TABS: ORAL_TABLET | ORAL | 0 refills | Status: DC

## 2020-04-16 NOTE — Telephone Encounter (Signed)
She called and requested a tramadol refill.

## 2020-04-16 NOTE — Telephone Encounter (Signed)
done

## 2020-04-16 NOTE — Telephone Encounter (Signed)
Called and left a message Rx sent. Ask her to call the office for questions. 

## 2020-04-29 ENCOUNTER — Telehealth: Payer: Self-pay | Admitting: *Deleted

## 2020-04-29 ENCOUNTER — Other Ambulatory Visit: Payer: Self-pay | Admitting: Hematology and Oncology

## 2020-04-29 DIAGNOSIS — B20 Human immunodeficiency virus [HIV] disease: Secondary | ICD-10-CM

## 2020-04-29 NOTE — Telephone Encounter (Signed)
[name] called for a refill of Tramadol to be sent to [facility] on [address]

## 2020-04-30 NOTE — Telephone Encounter (Signed)
done

## 2020-05-13 ENCOUNTER — Ambulatory Visit: Payer: 59 | Admitting: Internal Medicine

## 2020-05-14 ENCOUNTER — Other Ambulatory Visit: Payer: Self-pay | Admitting: Hematology and Oncology

## 2020-05-14 ENCOUNTER — Telehealth: Payer: Self-pay

## 2020-05-14 DIAGNOSIS — B20 Human immunodeficiency virus [HIV] disease: Secondary | ICD-10-CM

## 2020-05-14 MED ORDER — TRAMADOL HCL 50 MG PO TABS: 100.0000 mg | ORAL_TABLET | Freq: Four times a day (QID) | ORAL | 0 refills | Status: DC | PRN

## 2020-05-14 NOTE — Telephone Encounter (Signed)
She called and left a message requesting refill on Tramadol Rx. She ask that it be sent to [facility].

## 2020-05-14 NOTE — Telephone Encounter (Signed)
Called and told Rx sent sent, she verbalized understanding.

## 2020-05-14 NOTE — Telephone Encounter (Signed)
done

## 2020-05-15 ENCOUNTER — Ambulatory Visit: Payer: 59 | Admitting: Internal Medicine

## 2020-05-21 ENCOUNTER — Ambulatory Visit (INDEPENDENT_AMBULATORY_CARE_PROVIDER_SITE_OTHER): Payer: 59 | Admitting: Internal Medicine

## 2020-05-21 ENCOUNTER — Other Ambulatory Visit: Payer: Self-pay

## 2020-05-21 ENCOUNTER — Encounter: Payer: Self-pay | Admitting: Internal Medicine

## 2020-05-21 VITALS — BP 108/69 | HR 76 | Temp 98.4°F | Wt 108.0 lb

## 2020-05-21 DIAGNOSIS — F17210 Nicotine dependence, cigarettes, uncomplicated: Secondary | ICD-10-CM | POA: Diagnosis not present

## 2020-05-21 DIAGNOSIS — Z23 Encounter for immunization: Secondary | ICD-10-CM

## 2020-05-21 DIAGNOSIS — Z79899 Other long term (current) drug therapy: Secondary | ICD-10-CM | POA: Diagnosis not present

## 2020-05-21 DIAGNOSIS — B20 Human immunodeficiency virus [HIV] disease: Secondary | ICD-10-CM

## 2020-05-21 DIAGNOSIS — Z113 Encounter for screening for infections with a predominantly sexual mode of transmission: Secondary | ICD-10-CM

## 2020-05-21 DIAGNOSIS — R636 Underweight: Secondary | ICD-10-CM

## 2020-05-21 NOTE — Addendum Note (Signed)
Addended by: Caffie Pinto on: 05/21/2020 10:16 AM   Modules accepted: Orders

## 2020-05-21 NOTE — Assessment & Plan Note (Signed)
Will screen 

## 2020-05-21 NOTE — Assessment & Plan Note (Signed)
I discussed the need to quit smoking.  She is considering.

## 2020-05-21 NOTE — Assessment & Plan Note (Signed)
Will check her lipid panel today

## 2020-05-21 NOTE — Assessment & Plan Note (Signed)
She continues to refuse

## 2020-05-21 NOTE — Assessment & Plan Note (Signed)
She continues to do well with this and good tolerance of her medication.  She does not want to modernize her regimen since it is working well.  Labs today and rtc in 6 months.

## 2020-05-21 NOTE — Assessment & Plan Note (Signed)
She continues to eat and has gained 2 pounds.

## 2020-05-21 NOTE — Progress Notes (Signed)
   Subjective:    Patient ID: [name], female    DOB: 03/22/1986, 35 y.o.   MRN: [id number]  HPI Here for follow up of HIV She continues on atazanavir, ritonavir and Truvada and no missed doses. Last CD 4 of 707 and viral load < 20.  She continues to follow with oncology, Dr. Alvy Bimler.  She continues to refuse all vaccines.  She gained 2 pounds  Review of Systems  Constitutional: Negative for fatigue.  Gastrointestinal: Negative for diarrhea and nausea.  Skin: Negative for rash.       Objective:   Physical Exam Eyes:     General: No scleral icterus. Cardiovascular:     Rate and Rhythm: Normal rate and regular rhythm.  Pulmonary:     Effort: Pulmonary effort is normal.  Neurological:     Mental Status: She is alert.  Psychiatric:        Mood and Affect: Mood normal.   SH: + tobacco        Assessment & Plan:

## 2020-05-22 LAB — T-HELPER CELL (CD4) - (RCID CLINIC ONLY)
CD4 % Helper T Cell: 41 % (ref 33–65)
CD4 T Cell Abs: 732 /uL (ref 400–1790)

## 2020-05-24 LAB — RPR: RPR Ser Ql: NONREACTIVE

## 2020-05-24 LAB — LIPID PANEL
Cholesterol: 197 mg/dL (ref <200)
HDL: 52 mg/dL (ref >=50)
LDL Cholesterol (Calc): 122 mg/dL (calc) — ABNORMAL HIGH
Non-HDL Cholesterol (Calc): 145 mg/dL (calc) — ABNORMAL HIGH (ref <130)
Total CHOL/HDL Ratio: 3.8 (calc) (ref <5.0)
Triglycerides: 123 mg/dL (ref <150)

## 2020-05-24 LAB — HIV-1 RNA QUANT-NO REFLEX-BLD
HIV 1 RNA Quant: <20 Copies/mL
HIV-1 RNA Quant, Log: <1.3 Log cps/mL

## 2020-05-31 ENCOUNTER — Other Ambulatory Visit: Payer: Self-pay | Admitting: Hematology and Oncology

## 2020-05-31 ENCOUNTER — Telehealth: Payer: Self-pay

## 2020-05-31 DIAGNOSIS — B20 Human immunodeficiency virus [HIV] disease: Secondary | ICD-10-CM

## 2020-05-31 MED ORDER — TRAMADOL HCL 50 MG PO TABS: 100.0000 mg | ORAL_TABLET | Freq: Four times a day (QID) | ORAL | 0 refills | Status: DC | PRN

## 2020-05-31 NOTE — Telephone Encounter (Signed)
Called and left below message. She verbalized understanding. 

## 2020-05-31 NOTE — Telephone Encounter (Signed)
done

## 2020-05-31 NOTE — Telephone Encounter (Signed)
She called to request Tramadol refill.

## 2020-06-10 ENCOUNTER — Other Ambulatory Visit: Payer: Self-pay | Admitting: Hematology and Oncology

## 2020-06-10 ENCOUNTER — Telehealth: Payer: Self-pay

## 2020-06-10 DIAGNOSIS — B20 Human immunodeficiency virus [HIV] disease: Secondary | ICD-10-CM

## 2020-06-10 MED ORDER — TRAMADOL HCL 50 MG PO TABS: 100.0000 mg | ORAL_TABLET | Freq: Four times a day (QID) | ORAL | 0 refills | Status: DC | PRN

## 2020-06-10 NOTE — Telephone Encounter (Signed)
Called and told Rx sent. She verbalzied understanding.

## 2020-06-10 NOTE — Telephone Encounter (Signed)
done

## 2020-06-10 NOTE — Telephone Encounter (Signed)
She called and left a message asking for a refill on Tramadol.

## 2020-06-17 ENCOUNTER — Telehealth: Payer: Self-pay

## 2020-06-17 NOTE — Telephone Encounter (Signed)
TC from Pt stating that she wants to know if she can cancel CT scan until next year. Dr Alvy Bimler informed she stated as long as Pt doesn't have any issues this is ok. Pt states she wants to cancel scan.

## 2020-06-18 ENCOUNTER — Telehealth: Payer: Self-pay

## 2020-06-18 NOTE — Telephone Encounter (Signed)
-----   Message from Heath Lark, MD sent at 06/18/2020  8:29 AM EST ----- Regarding: RE: CT Scan Hi Kathlee Nations,  Can you move her appt to see me from 3/8 to 3/7? Thanks ----- Message ----- From: Kelli Hope, LPN Sent: 8/83/2549   6:00 PM EST To: Heath Lark, MD Subject: RE: CT Scan                                    Dr. Alvy Bimler on the 3/8 there is a meeting with lab and nursing do you want to schedule lab after your appointment? ----- Message ----- From: Heath Lark, MD Sent: 06/17/2020   2:42 PM EST To: Kelli Hope, LPN Subject: RE: CT Scan                                    I cancelled her CT I still have to see her every 3 months with labs Please move her labs to be done same day of appt ----- Message ----- From: Kelli Hope, LPN Sent: 12/20/4156   2:40 PM EST To: Heath Lark, MD Subject: RE: CT Scan                                    Dr. Alvy Bimler she says she is gaining weight and back to work and wants to cancel it to next year ----- Message ----- From: Heath Lark, MD Sent: 06/17/2020  11:57 AM EST To: Kelli Hope, LPN Subject: RE: CT Scan                                    If she has no symptoms (new pain, lump, weight loss) we can cancel and do it next year If she has symptoms then we need to know Let me know what she decides ----- Message ----- From: Kelli Hope, LPN Sent: 07/04/4074  11:42 AM EST To: Heath Lark, MD Subject: CT Scan                                        Hi Dr. Oneita Kras called and wants to know can she prolong her CT Scan because she had one last year and you told her its every 2 years.

## 2020-06-18 NOTE — Telephone Encounter (Signed)
Appointments update.  RN left voicemail updating patient, contact information left for any additional needs.

## 2020-06-20 ENCOUNTER — Other Ambulatory Visit: Payer: Self-pay | Admitting: Hematology and Oncology

## 2020-06-20 ENCOUNTER — Telehealth: Payer: Self-pay

## 2020-06-20 DIAGNOSIS — B20 Human immunodeficiency virus [HIV] disease: Secondary | ICD-10-CM

## 2020-06-20 NOTE — Telephone Encounter (Signed)
She called and requested a refill on Tramadol Rx. 

## 2020-06-20 NOTE — Telephone Encounter (Signed)
Called and given below message. She verbalized understanding. 

## 2020-06-20 NOTE — Telephone Encounter (Signed)
Not due until tomorrow. I will do it tomorrow

## 2020-06-21 ENCOUNTER — Other Ambulatory Visit: Payer: Self-pay | Admitting: Hematology and Oncology

## 2020-06-21 DIAGNOSIS — B20 Human immunodeficiency virus [HIV] disease: Secondary | ICD-10-CM

## 2020-06-21 MED ORDER — TRAMADOL HCL 50 MG PO TABS: 100.0000 mg | ORAL_TABLET | Freq: Four times a day (QID) | ORAL | 0 refills | Status: DC | PRN

## 2020-07-01 ENCOUNTER — Inpatient Hospital Stay (HOSPITAL_BASED_OUTPATIENT_CLINIC_OR_DEPARTMENT_OTHER): Payer: 59 | Admitting: Hematology and Oncology

## 2020-07-01 ENCOUNTER — Telehealth: Payer: Self-pay | Admitting: Hematology and Oncology

## 2020-07-01 ENCOUNTER — Other Ambulatory Visit: Payer: Self-pay

## 2020-07-01 ENCOUNTER — Inpatient Hospital Stay: Payer: 59 | Attending: Hematology and Oncology

## 2020-07-01 ENCOUNTER — Ambulatory Visit (HOSPITAL_COMMUNITY): Payer: 59

## 2020-07-01 ENCOUNTER — Encounter: Payer: Self-pay | Admitting: Hematology and Oncology

## 2020-07-01 DIAGNOSIS — B20 Human immunodeficiency virus [HIV] disease: Secondary | ICD-10-CM | POA: Diagnosis not present

## 2020-07-01 DIAGNOSIS — C8118 Nodular sclerosis classical Hodgkin lymphoma, lymph nodes of multiple sites: Secondary | ICD-10-CM | POA: Diagnosis present

## 2020-07-01 DIAGNOSIS — G8929 Other chronic pain: Secondary | ICD-10-CM | POA: Insufficient documentation

## 2020-07-01 DIAGNOSIS — M25531 Pain in right wrist: Secondary | ICD-10-CM | POA: Diagnosis not present

## 2020-07-01 LAB — CBC WITH DIFFERENTIAL/PLATELET
Abs Immature Granulocytes: 0.02 10*3/uL (ref 0.00–0.07)
Basophils Absolute: 0 10*3/uL (ref 0.0–0.1)
Basophils Relative: 1 %
Eosinophils Absolute: 0.1 10*3/uL (ref 0.0–0.5)
Eosinophils Relative: 2 %
HCT: 41.1 % (ref 36.0–46.0)
Hemoglobin: 14.4 g/dL (ref 12.0–15.0)
Immature Granulocytes: 0 %
Lymphocytes Relative: 29 %
Lymphs Abs: 1.8 10*3/uL (ref 0.7–4.0)
MCH: 34.4 pg — ABNORMAL HIGH (ref 26.0–34.0)
MCHC: 35 g/dL (ref 30.0–36.0)
MCV: 98.1 fL (ref 80.0–100.0)
Monocytes Absolute: 0.4 10*3/uL (ref 0.1–1.0)
Monocytes Relative: 7 %
Neutro Abs: 3.8 10*3/uL (ref 1.7–7.7)
Neutrophils Relative %: 61 %
Platelets: 255 10*3/uL (ref 150–400)
RBC: 4.19 MIL/uL (ref 3.87–5.11)
RDW: 12.2 % (ref 11.5–15.5)
WBC: 6.2 10*3/uL (ref 4.0–10.5)
nRBC: 0 % (ref 0.0–0.2)

## 2020-07-01 LAB — COMPREHENSIVE METABOLIC PANEL
ALT: 15 U/L (ref 0–44)
AST: 17 U/L (ref 15–41)
Albumin: 4.1 g/dL (ref 3.5–5.0)
Alkaline Phosphatase: 71 U/L (ref 38–126)
Anion gap: 7 (ref 5–15)
BUN: 12 mg/dL (ref 6–20)
CO2: 26 mmol/L (ref 22–32)
Calcium: 9.3 mg/dL (ref 8.9–10.3)
Chloride: 105 mmol/L (ref 98–111)
Creatinine, Ser: 1.05 mg/dL — ABNORMAL HIGH (ref 0.44–1.00)
GFR, Estimated: >60 mL/min (ref >60)
Glucose, Bld: 83 mg/dL (ref 70–99)
Potassium: 3.8 mmol/L (ref 3.5–5.1)
Sodium: 138 mmol/L (ref 135–145)
Total Bilirubin: 1.4 mg/dL — ABNORMAL HIGH (ref 0.3–1.2)
Total Protein: 7.1 g/dL (ref 6.5–8.1)

## 2020-07-01 NOTE — Telephone Encounter (Signed)
Scheduled appointments per 3/7 sch msg. Spoke to patient who is aware of appointments date and times.

## 2020-07-01 NOTE — Assessment & Plan Note (Signed)
Clinically, she has no signs of lymphoma recurrence We discussed the risks of recurrence and signs and symptoms to watch out for I will see her in 3 months

## 2020-07-01 NOTE — Progress Notes (Signed)
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   All questions were answered. The patient knows to call the clinic with any problems, questions or concerns. The total time spent in the appointment was 20 minutes encounter with patients including review of chart and various tests results, discussions about plan of care and coordination of care plan   Heath Lark, MD 07/01/2020 8:19 AM  INTERVAL HISTORY: Please see below for problem oriented charting. 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   05/07/2014 Imaging   CT scan of the chest show right thoracic inlet  lymphadenopathy   06/07/2014 Procedure   She underwent ultrasound-guided core biopsy of the neck lymph node   06/07/2014 Pathology Results   Accession: [id number] biopsy confirmed diagnosis of Hodgkin lymphoma.   06/15/2014 Imaging   Echocardiogram showed preserved ejection fraction   07/09/2014 - 07/12/2014 Hospital Admission   She was admitted to the hospital for severe anemia.   07/27/2014 Procedure   She had placement of port   07/31/2014 - 09/11/2014 Chemotherapy   She received dose adjusted chemotherapy due to abnormal liver function tests and severe anemia. Treatment was delayed due to noncompliance  and subsequently stopped because the patient failed to keep appointments   01/11/2015 Imaging   Repeat PET CT scan showed response to treatment   01/28/2015 - 06/18/2015 Chemotherapy   ABVD was restarted with full dose.   02/08/2015 - 02/10/2015 Hospital Admission   The patient was admitted to the hospital due to pancytopenia and profuse diarrhea. Cultures were negative. She was placed on ciprofloxacin.   02/11/2015 Adverse Reaction   Treatment was placed on hold due to recent infection.   04/11/2015 Imaging   PET CT scan showed near complete response. Incidental finding of an abnormal bone lesion, indeterminate. She is not symptomatic. Recommendation from Hem [facility] to observe   07/11/2015 Imaging   PET CT scan showed abnormal new bone lesions, suggestive of possible disease progression   07/23/2015 Bone Marrow Biopsy   She underwent bone biopsy   07/23/2015 Pathology Results   Accession: [id number]  biopsy was negative for cancer   11/21/2015 Surgery   She had surgery for ectopic pregnancy   01/29/2016 Imaging   Ct chest, abdomen and pelvis showed pelvic and retroperitoneal lymphadenopathy, as above, concerning for residual  disease. There is also a mildly enlarged posterior mediastinal lymph node measuring 11 mm adjacent to the distal descending thoracic aorta. This may represent an  additional focus of disease, but is the only finding of concern in the thorax on today's examination. Sclerosis in the right ilium at site of previously noted metabolically active lesion, grossly unchanged. No other definite osseous lesions are identified on today's examination. Spleen is normal in size and appearance.   02/14/2016 PET scan   Interval disease worsening with new foci of hypermetabolic activity in multiple retroperitoneal and pelvic lymph nodes as well as AP window and left hilar lymph nodes. (Deauville 5). There is also overall worsening of the osseous disease.   05/11/2016 Pathology Results   Diagnosis Lymph node, needle/core biopsy, Left para-aortic retroperitoneal - CLASSICAL HODGKIN LYMPHOMA. - SEE ONCOLOGY TABLE. Microscopic Comment LYMPHOMA Histologic type: Classical Hodgkin lymphoma. Grade (if applicable): N/A Flow cytometry: Not done. Immunohistochemical stains: CD15, CD20, CD3, LCA, PAX-5, CD30 with appropriate controls. Touch preps/imprints: Not performed. Comments: The sections show small needle core biopsy fragments displaying a polymorphous cellular proliferation of small lymphocytes, plasma cells, eosinophils, and large atypical mononuclear and multilobated lymphoid cells with features of Reed-Sternberg cells and variants. This is associated with patchy areas of fibrosis. Immunohistochemical stains were performed and show that the large atypical lymphoid cells are positive for CD30, CD15 and PAX-5 and negative for LCA, CD20, CD3. The small lymphoid cells in the background show a mixture of T and B cells with predominance of T cells. The overall morphologic and histologic features are consistent with classical Hodgkin lymphoma. Further subtyping is challenging in limited small biopsy fragments but the patchy fibrosis suggests nodular sclerosis type.    05/11/2016 Procedure   She underwent CT guided biopsy of retroperitoneal lymph node   05/26/2016 Procedure    Successful placement of a right IJ approach Power Port with ultrasound and fluoroscopic guidance. The catheter is ready for use.   06/02/2016 PET scan   Mixed response to chemotherapy with some lymph nodes decreased in metabolic activity and some lymph nodes increase metabolic activity. Lymph node stations including mediastinum, periaortic retroperitoneum, and obturator node stations. Activity is remains relatively intense Deauville 4 2. LEFT infrahilar nodule / lymph node with intense metabolic activity decreased from prior. ( Deauville 4 ). 3. 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 Deauville 5   06/03/2016 - 06/05/2016 Hospital Admission   She was admitted to the hospital for cycle 1 of ICE chemotherapy   06/24/2016 - 06/26/2016 Hospital Admission   She received cycle 2 of ICE chemo   07/07/2016 PET scan   Resolution of prior hypermetabolic adenopathy and resolution of prior osseous foci of hypermetabolic activity compatible with essentially complete response to therapy (Deauville 1). 2. Generalized reduced activity in the L4 vertebral body and the type of finding which would typically reflect prior radiation therapy. 3. Stable septated fatty right pelvic lesion, possibly a dermoid or lipoma, not hypermetabolic   5/63/8937 PET scan   Hypermetabolic lesion along the L3 vertebral body and left posterior elements, max SUV 7.8 (Deauville 5). Hypermetabolic lesion along the left inferior pubic ramus, max SUV 4.9 (Deauville 4).  IMPRESSION: Prevascular lymphadenopathy, reflecting nodal recurrence (Deauville 4). Hypermetabolic osseous metastases involving the L3 vertebral body/posterior elements and left inferior pubic ramus (Deauville 4-5). Hypermetabolism along the endometrium, new, possibly reactive/physiologic. Consider pelvic ultrasound and/or endometrial sampling as clinically warranted.   11/04/2016 - 02/24/2017 Chemotherapy   She received Brentuximab   12/11/2016  PET  scan   1. Mixed response to therapy within the skeleton. Lesions at L3 is decreased in size and metabolic activity. Residual activity remains above liver ( Deauville 4) 2. Increased activity in the LEFT sacrum with metabolic activity above liver activity ( Deauville 4 3. Decrease in size and metabolic activity of anterior mediastinal tissue consistent with resolution of thymic hyperplasia or resolution of lymphoma metabolic activity ( Deauville 2). 4. No new lymphadenopathy. Normal spleen and liver.   03/25/2017 PET scan   1. Increased metabolic activity in several normal sized retroperitoneal and right pelvic lymph nodes, Deauville 4. 2. Increase in size and metabolic activity of lesions in the L3 and L4 vertebral bodies and in the left sacrum, Deauville 5. 3. No recurrence of anterior mediastinal abnormal activity. 4. Other imaging findings of potential clinical significance: Chronic ethmoid and left maxillary sinusitis. Paraseptal emphysema at the lung apices. Right ovarian dermoid.   04/06/2017 -  Chemotherapy   The patient had Keytruda   04/06/2017 - 05/23/2019 Chemotherapy   The patient had pembrolizumab (KEYTRUDA) 200 mg in sodium chloride 0.9 % 50 mL chemo infusion, 200 mg, Intravenous, Once, 30 of 31 cycles Administration: 200 mg (04/06/2017), 200 mg (04/29/2017), 200 mg (05/20/2017), 200 mg (06/10/2017), 200 mg (07/01/2017), 200 mg (07/22/2017), 200 mg (08/12/2017), 200 mg (09/02/2017), 200 mg (10/04/2017), 200 mg (11/03/2017), 200 mg (12/02/2017), 200 mg (12/30/2017), 200 mg (01/27/2018), 200 mg (02/24/2018), 200 mg (03/22/2018), 200 mg (04/19/2018), 200 mg (05/17/2018), 200 mg (06/14/2018), 200 mg (07/12/2018), 200 mg (08/09/2018), 200 mg (09/06/2018), 200 mg (10/04/2018), 200 mg (11/01/2018), 200 mg (11/29/2018), 200 mg (12/27/2018), 200 mg (01/24/2019), 200 mg (03/14/2019), 200 mg (05/23/2019)  for chemotherapy treatment.    06/09/2017 PET scan   1. Overall significant improvement with reduction in nodal activity.  Several retroperitoneal nodes which were previously Deauville 4 are currently Deauville 3. The right common iliac lymph node remains at Deauville 4 with maximum SUV of 3.4, but is improved from prior SUV of 4.3. 2. The bony lesions show the greatest improvement, with the previously Deauville 5 hypermetabolic lesions currently no longer of higher metabolic activity than surrounding bone marrow, currently measuring at Deauville 3. 3. 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 4. No new lesions are identified. 5. Emphysema (ICD10-J43.9).   08/05/2017 Imaging   1. Somewhat complex left adnexal lesion, potentially hemorrhagic cyst measuring 2.9 x 2.8 cm. The possibility of torsion in the left ovary must be considered. Advise correlation with pelvic ultrasound including Doppler assessment to further evaluate.  2.  Right ovarian dermoid, unchanged from recent PET-CT examination.  3. No adenopathy by size criteria evident. Appearance of subcentimeter mesenteric lymph nodes is stable compared to recent study.  4. No bone lesions appreciable by CT. Areas of abnormal radiotracer uptake in the mid lumbar spine noted on recent PET-CT examination. No destruction 2 or lytic change noted in the lumbar vertebrae currently there.  5. No bowel obstruction. No abscess. No periappendiceal region inflammation.  6.  Small hiatal hernia with fluid in distal esophagus.  7.  Foci of coronary artery calcification, advanced for age.  8.  Spleen normal in size and contour.  9. Small hiatal hernia with mild fluid in the distal esophagus, likely indicative of a degree of reflux.   01/04/2018 Imaging   1. No findings to suggest residual/recurrent lymphoma in the chest, abdomen or pelvis. 2. Right ovarian dermoid slightly larger than prior examinations, currently measuring 3.8 x 2.8 x 3.1  cm. 3. Additional incidental findings, as above.   07/11/2018 Imaging   No findings  suspicious for recurrent lymphoma. Spleen is normal in size.  Stable right ovarian dermoid.  4 mm subpleural nodular opacity in the posterior right lower lobe, favoring subpleural atelectasis.   07/17/2019 Imaging   1. Stable exam. No new or progressive findings to suggest recurrent disease. 2. Stable right adnexal dermoid. 3. Emphysema (ICD10-J43.9).   09/19/2019 Procedure   Successful right IJ vein Port-A-Cath explant.   12/05/2019 Imaging   1. Stable exam. No findings to suggest recurrent lymphoma. 2. 9 mm left para-aortic node is upper normal for size and mildly increased from 7 mm previously. Attention on follow-up recommended.  3. Stable right adnexal dermoid. 4. Emphysema (ICD10-J43.9).       REVIEW OF SYSTEMS:   Constitutional: Denies fevers, chills or abnormal weight loss Eyes: Denies blurriness of vision Ears, nose, mouth, throat, and face: Denies mucositis or sore throat Respiratory: Denies cough, dyspnea or wheezes Cardiovascular: Denies palpitation, chest discomfort or lower extremity swelling Gastrointestinal:  Denies nausea, heartburn or change in bowel habits Skin: Denies abnormal skin rashes Lymphatics: Denies new lymphadenopathy or easy bruising Neurological:Denies numbness, tingling or new weaknesses Behavioral/Psych: Mood is stable, no new changes  All other systems were reviewed with the patient and are negative.  I have reviewed the past medical history, past surgical history, social history and family history with the patient and they are unchanged from previous note.  ALLERGIES:  is allergic to temazepam.  MEDICATIONS:  Current Outpatient Medications  Medication Sig Dispense Refill  . atazanavir (REYATAZ) 300 MG capsule TAKE ONE CAPSULE BY MOUTH DAILY WITH BREAKFAST. TAKE WITH NORVIR 90 capsule 1  . cholecalciferol (VITAMIN D) 1000 units tablet Take 1 tablet (1,000 Units total) by mouth daily. 30 tablet 9  . emtricitabine-tenofovir (TRUVADA) 200-300  MG tablet Take 1 tablet by mouth daily. 90 tablet 1  . hydrOXYzine (ATARAX/VISTARIL) 10 MG tablet Take 1 tablet (10 mg total) by mouth 3 (three) times daily as needed. 90 tablet 0  . lidocaine (XYLOCAINE) 5 % ointment Apply 1 application topically as needed. Will numb skin for cleaning and urination 30 g 0  . metaxalone (SKELAXIN) 800 MG tablet Take 0.5 tablets (400 mg total) by mouth 3 (three) times daily. Start by taking 1/2 a pill. Don't drive after taking this medication, as it could make you sleepy. You can increase to 1 pill if you tolerate 1/2 pill well and need additional relief. 21 tablet 0  . ritonavir (NORVIR) 100 MG TABS tablet TAKE 1 TABLET BY MOUTH EVERY MORNING WITH BREAKFAST, TAKE WITH REYATAZ 90 tablet 1  . traMADol (ULTRAM) 50 MG tablet Take 2 tablets (100 mg total) by mouth every 6 (six) hours as needed. 90 tablet 0   No current facility-administered medications for this visit.    PHYSICAL EXAMINATION: ECOG PERFORMANCE STATUS: 0 - Asymptomatic GENERAL:alert, no distress and comfortable SKIN: skin color, texture, turgor are normal, no rashes or significant lesions EYES: normal, Conjunctiva are pink and non-injected, sclera clear OROPHARYNX:no exudate, no erythema and lips, buccal mucosa, and tongue normal  NECK: supple, thyroid normal size, non-tender, without nodularity LYMPH:  no palpable lymphadenopathy in the cervical, axillary or inguinal LUNGS: clear to auscultation and percussion with normal breathing effort HEART: regular rate & rhythm and no murmurs and no lower extremity edema ABDOMEN:abdomen soft, non-tender and normal bowel sounds Musculoskeletal:no cyanosis of digits and no clubbing  NEURO: alert & oriented x 3 with  fluent speech, no focal motor/sensory deficits  LABORATORY DATA:  I have reviewed the data as listed    Component Value Date/Time   NA 140 04/08/2020 0755   NA 140 04/29/2017 0745   K 3.9 04/08/2020 0755   K 3.3 (L) 04/29/2017 0745   CL 106  04/08/2020 0755   CO2 27 04/08/2020 0755   CO2 23 04/29/2017 0745   GLUCOSE 90 04/08/2020 0755   GLUCOSE 83 04/29/2017 0745   BUN 8 04/08/2020 0755   BUN 10.7 04/29/2017 0745   CREATININE 1.00 04/08/2020 0755   CREATININE 1.2 (H) 04/29/2017 0745   CALCIUM 9.8 04/08/2020 0755   CALCIUM 9.8 04/29/2017 0745   PROT 7.3 04/08/2020 0755   PROT 7.6 04/29/2017 0745   ALBUMIN 4.1 04/08/2020 0755   ALBUMIN 4.0 04/29/2017 0745   AST 22 04/08/2020 0755   AST 19 04/29/2017 0745   ALT 21 04/08/2020 0755   ALT 19 04/29/2017 0745   ALKPHOS 86 04/08/2020 0755   ALKPHOS 81 04/29/2017 0745   BILITOT 1.4 (H) 04/08/2020 0755   BILITOT 1.31 (H) 04/29/2017 0745   GFRNONAA >60 04/08/2020 0755   GFRNONAA 83 02/20/2016 0908   GFRAA >60 11/07/2019 0740   GFRAA >89 02/20/2016 0908    No results found for: SPEP, UPEP  Lab Results  Component Value Date   WBC 6.2 07/01/2020   NEUTROABS 3.8 07/01/2020   HGB 14.4 07/01/2020   HCT 41.1 07/01/2020   MCV 98.1 07/01/2020   PLT 255 07/01/2020      Chemistry      Component Value Date/Time   NA 140 04/08/2020 0755   NA 140 04/29/2017 0745   K 3.9 04/08/2020 0755   K 3.3 (L) 04/29/2017 0745   CL 106 04/08/2020 0755   CO2 27 04/08/2020 0755   CO2 23 04/29/2017 0745   BUN 8 04/08/2020 0755   BUN 10.7 04/29/2017 0745   CREATININE 1.00 04/08/2020 0755   CREATININE 1.2 (H) 04/29/2017 0745      Component Value Date/Time   CALCIUM 9.8 04/08/2020 0755   CALCIUM 9.8 04/29/2017 0745   ALKPHOS 86 04/08/2020 0755   ALKPHOS 81 04/29/2017 0745   AST 22 04/08/2020 0755   AST 19 04/29/2017 0745   ALT 21 04/08/2020 0755   ALT 19 04/29/2017 0745   BILITOT 1.4 (H) 04/08/2020 0755   BILITOT 1.31 (H) 04/29/2017 0745

## 2020-07-01 NOTE — Assessment & Plan Note (Signed)
According to recent blood work, her HIV is undetectable She will continue antiretroviral therapy as directed by infectious disease team

## 2020-07-01 NOTE — Assessment & Plan Note (Signed)
She has chronic musculoskeletal pain since discontinuation of treatment She will continue prescribed tramadol We discussed narcotic refill policy 

## 2020-07-02 ENCOUNTER — Ambulatory Visit: Payer: 59 | Admitting: Hematology and Oncology

## 2020-07-02 ENCOUNTER — Other Ambulatory Visit: Payer: Self-pay | Admitting: Hematology and Oncology

## 2020-07-02 ENCOUNTER — Telehealth: Payer: Self-pay

## 2020-07-02 DIAGNOSIS — B20 Human immunodeficiency virus [HIV] disease: Secondary | ICD-10-CM

## 2020-07-02 MED ORDER — TRAMADOL HCL 50 MG PO TABS: 100.0000 mg | ORAL_TABLET | Freq: Four times a day (QID) | ORAL | 0 refills | Status: DC | PRN

## 2020-07-02 NOTE — Telephone Encounter (Signed)
She called and requested a Tramadol refill.

## 2020-07-02 NOTE — Telephone Encounter (Signed)
done

## 2020-07-02 NOTE — Telephone Encounter (Signed)
Called and given below message. She verbalized understanding. 

## 2020-07-12 ENCOUNTER — Telehealth: Payer: Self-pay

## 2020-07-12 ENCOUNTER — Other Ambulatory Visit: Payer: Self-pay | Admitting: Internal Medicine

## 2020-07-12 ENCOUNTER — Other Ambulatory Visit: Payer: Self-pay | Admitting: Hematology and Oncology

## 2020-07-12 DIAGNOSIS — B20 Human immunodeficiency virus [HIV] disease: Secondary | ICD-10-CM

## 2020-07-12 MED ORDER — RITONAVIR 100 MG PO TABS: ORAL_TABLET | ORAL | 0 refills | Status: DC

## 2020-07-12 MED ORDER — TRAMADOL HCL 50 MG PO TABS: 100.0000 mg | ORAL_TABLET | Freq: Four times a day (QID) | ORAL | 0 refills | Status: DC | PRN

## 2020-07-12 MED ORDER — EMTRICITABINE-TENOFOVIR DF 200-300 MG PO TABS: 1.0000 | ORAL_TABLET | Freq: Every day | ORAL | 0 refills | Status: DC

## 2020-07-12 NOTE — Telephone Encounter (Signed)
done

## 2020-07-12 NOTE — Telephone Encounter (Signed)
Called and left a message Rx sent. 

## 2020-07-12 NOTE — Telephone Encounter (Signed)
She called and left a message requesting Tramadol Refill. She is due this weekend for refill.

## 2020-07-23 ENCOUNTER — Other Ambulatory Visit: Payer: Self-pay | Admitting: Hematology and Oncology

## 2020-07-23 ENCOUNTER — Telehealth: Payer: Self-pay

## 2020-07-23 DIAGNOSIS — B20 Human immunodeficiency virus [HIV] disease: Secondary | ICD-10-CM

## 2020-07-23 MED ORDER — TRAMADOL HCL 50 MG PO TABS: 100.0000 mg | ORAL_TABLET | Freq: Four times a day (QID) | ORAL | 0 refills | Status: DC | PRN

## 2020-07-23 NOTE — Telephone Encounter (Signed)
Called and given below message. She verbalized understanding. 

## 2020-07-23 NOTE — Telephone Encounter (Signed)
She called and left a message asking for a refill of Tramadol Rx.

## 2020-07-23 NOTE — Telephone Encounter (Signed)
done

## 2020-08-05 ENCOUNTER — Telehealth: Payer: Self-pay

## 2020-08-05 ENCOUNTER — Other Ambulatory Visit: Payer: Self-pay | Admitting: Hematology and Oncology

## 2020-08-05 DIAGNOSIS — B20 Human immunodeficiency virus [HIV] disease: Secondary | ICD-10-CM

## 2020-08-05 MED ORDER — TRAMADOL HCL 50 MG PO TABS: 100.0000 mg | ORAL_TABLET | Freq: Four times a day (QID) | ORAL | 0 refills | Status: DC | PRN

## 2020-08-05 NOTE — Telephone Encounter (Signed)
done

## 2020-08-05 NOTE — Telephone Encounter (Signed)
Called and left a message Rx sent. 

## 2020-08-05 NOTE — Telephone Encounter (Signed)
She called and left a message requesting refill on Tramadol Rx.  

## 2020-08-19 ENCOUNTER — Other Ambulatory Visit: Payer: Self-pay | Admitting: Hematology and Oncology

## 2020-08-19 ENCOUNTER — Telehealth: Payer: Self-pay

## 2020-08-19 DIAGNOSIS — B20 Human immunodeficiency virus [HIV] disease: Secondary | ICD-10-CM

## 2020-08-19 MED ORDER — TRAMADOL HCL 50 MG PO TABS: 100.0000 mg | ORAL_TABLET | Freq: Four times a day (QID) | ORAL | 0 refills | Status: DC | PRN

## 2020-08-19 NOTE — Telephone Encounter (Signed)
done

## 2020-08-19 NOTE — Telephone Encounter (Signed)
Called and given message Rx sent. She verbalized understanding. 

## 2020-08-19 NOTE — Telephone Encounter (Signed)
She called and left a message requesting Tramadol Rx refill.

## 2020-08-30 ENCOUNTER — Other Ambulatory Visit: Payer: Self-pay | Admitting: Hematology and Oncology

## 2020-08-30 DIAGNOSIS — B20 Human immunodeficiency virus [HIV] disease: Secondary | ICD-10-CM

## 2020-08-30 MED ORDER — TRAMADOL HCL 50 MG PO TABS: 100.0000 mg | ORAL_TABLET | Freq: Four times a day (QID) | ORAL | 0 refills | Status: DC | PRN

## 2020-09-09 ENCOUNTER — Other Ambulatory Visit: Payer: Self-pay | Admitting: Hematology and Oncology

## 2020-09-09 DIAGNOSIS — B20 Human immunodeficiency virus [HIV] disease: Secondary | ICD-10-CM

## 2020-09-09 MED ORDER — TRAMADOL HCL 50 MG PO TABS: 100.0000 mg | ORAL_TABLET | Freq: Four times a day (QID) | ORAL | 0 refills | Status: DC | PRN

## 2020-09-16 ENCOUNTER — Other Ambulatory Visit: Payer: Self-pay

## 2020-09-16 ENCOUNTER — Encounter (HOSPITAL_COMMUNITY): Payer: Self-pay | Admitting: Emergency Medicine

## 2020-09-16 ENCOUNTER — Emergency Department (HOSPITAL_COMMUNITY)
Admission: EM | Admit: 2020-09-16 | Discharge: 2020-09-16 | Disposition: A | Discharge status: Home / Self Care | Payer: 59 | Attending: Emergency Medicine | Admitting: Emergency Medicine

## 2020-09-16 DIAGNOSIS — R197 Diarrhea, unspecified: Secondary | ICD-10-CM | POA: Diagnosis not present

## 2020-09-16 DIAGNOSIS — R42 Dizziness and giddiness: Secondary | ICD-10-CM | POA: Diagnosis not present

## 2020-09-16 DIAGNOSIS — Z5321 Procedure and treatment not carried out due to patient leaving prior to being seen by health care provider: Secondary | ICD-10-CM | POA: Diagnosis not present

## 2020-09-16 LAB — COMPREHENSIVE METABOLIC PANEL
ALT: 20 U/L (ref 0–44)
AST: 23 U/L (ref 15–41)
Albumin: 4.3 g/dL (ref 3.5–5.0)
Alkaline Phosphatase: 64 U/L (ref 38–126)
Anion gap: 4 — ABNORMAL LOW (ref 5–15)
BUN: 12 mg/dL (ref 6–20)
CO2: 29 mmol/L (ref 22–32)
Calcium: 9.6 mg/dL (ref 8.9–10.3)
Chloride: 106 mmol/L (ref 98–111)
Creatinine, Ser: 1.07 mg/dL — ABNORMAL HIGH (ref 0.44–1.00)
GFR, Estimated: >60 mL/min (ref >60)
Glucose, Bld: 101 mg/dL — ABNORMAL HIGH (ref 70–99)
Potassium: 3.9 mmol/L (ref 3.5–5.1)
Sodium: 139 mmol/L (ref 135–145)
Total Bilirubin: 2 mg/dL — ABNORMAL HIGH (ref 0.3–1.2)
Total Protein: 7.6 g/dL (ref 6.5–8.1)

## 2020-09-16 LAB — CBC WITH DIFFERENTIAL/PLATELET
Abs Immature Granulocytes: 0.01 10*3/uL (ref 0.00–0.07)
Basophils Absolute: 0 10*3/uL (ref 0.0–0.1)
Basophils Relative: 1 %
Eosinophils Absolute: 0.1 10*3/uL (ref 0.0–0.5)
Eosinophils Relative: 3 %
HCT: 42.6 % (ref 36.0–46.0)
Hemoglobin: 14.9 g/dL (ref 12.0–15.0)
Immature Granulocytes: 0 %
Lymphocytes Relative: 45 %
Lymphs Abs: 2.1 10*3/uL (ref 0.7–4.0)
MCH: 35.1 pg — ABNORMAL HIGH (ref 26.0–34.0)
MCHC: 35 g/dL (ref 30.0–36.0)
MCV: 100.5 fL — ABNORMAL HIGH (ref 80.0–100.0)
Monocytes Absolute: 0.4 10*3/uL (ref 0.1–1.0)
Monocytes Relative: 9 %
Neutro Abs: 1.9 10*3/uL (ref 1.7–7.7)
Neutrophils Relative %: 42 %
Platelets: 251 10*3/uL (ref 150–400)
RBC: 4.24 MIL/uL (ref 3.87–5.11)
RDW: 12.7 % (ref 11.5–15.5)
WBC: 4.6 10*3/uL (ref 4.0–10.5)
nRBC: 0 % (ref 0.0–0.2)

## 2020-09-16 LAB — I-STAT BETA HCG BLOOD, ED (MC, WL, AP ONLY): I-stat hCG, quantitative: <5 m[IU]/mL (ref <5)

## 2020-09-16 LAB — LIPASE, BLOOD: Lipase: 22 U/L (ref 11–51)

## 2020-09-16 NOTE — ED Provider Notes (Signed)
Emergency Medicine Provider Triage Evaluation Note  [name] 35 y.o. female was evaluated in triage.  Pt complains of lightheadedness, dizziness that has been ongoing for 2 days.  She states she has not been eating much.  She has been doing Ensure shakes but states she has diarrhea.  She states that today, she felt she was going to pass out but never lost consciousness.  No abdominal pain, chest pain, difficulty breathing.   Review of Systems  Positive: Lightheadedness, dizziness, near syncope  Negative: CP, SOB, fever  Physical Exam  BP 134/82   Pulse 70   Temp 98.2 F (36.8 C) (Oral)   Resp 18   Ht 5\' 4"  (1.626 m)   Wt 65.8 kg   SpO2 100%   BMI 24.89 kg/m  Gen:   Awake, no distress   HEENT:  Atraumatic  Resp:  Normal effort. Lung CTAB Cardiac:  Normal rate  Abd:   Nondistended, nontender MSK:   Moves extremities without difficulty  Neuro:  Speech clear. 5/5 strength BUE and BLE   Other:      Medical Decision Making  Medically screening exam initiated at 4:08 PM  Appropriate orders placed.  [name] was informed that the remainder of the evaluation will be completed by another provider, this initial triage assessment does not replace that evaluation. They are counseled that they will need to remain in the ED until the completion of their workup, including full H&P and results of any tests.  Risks of leaving the emergency department prior to completion of treatment were discussed. Patient was advised to inform ED staff if they are leaving before their treatment is complete. The patient acknowledged these risks and time was allowed for questions.     The patient appears stable so that the remainder of the MSE may be completed by another provider.    Clinical Impression  Near syncope, diarrhea, dizziness    Portions of this note were generated with Dragon dictation software. Dictation errors may occur despite best attempts at proofreading.      Allison Napoleon, PA-C 09/16/20 [place], [place], DO 09/16/20 1629

## 2020-09-16 NOTE — ED Notes (Signed)
Pt states she wants to leave. Triage RN notified. 22g IV removed by this Probation officer.

## 2020-09-16 NOTE — ED Triage Notes (Signed)
Per EMS-diarrhea for 2 days-states she feels dizzy and "might pass out"

## 2020-09-24 ENCOUNTER — Telehealth: Payer: Self-pay

## 2020-09-24 ENCOUNTER — Other Ambulatory Visit: Payer: Self-pay | Admitting: Hematology and Oncology

## 2020-09-24 DIAGNOSIS — B20 Human immunodeficiency virus [HIV] disease: Secondary | ICD-10-CM

## 2020-09-24 MED ORDER — TRAMADOL HCL 50 MG PO TABS: 100.0000 mg | ORAL_TABLET | Freq: Four times a day (QID) | ORAL | 0 refills | Status: DC | PRN

## 2020-09-24 NOTE — Telephone Encounter (Signed)
Patient called requesting refill on Tramadol 50mg  tablets.  Last refill 5/16 -  Pharmacy request is [facility] on [address].

## 2020-09-24 NOTE — Telephone Encounter (Signed)
Patient notified, no further needs.

## 2020-09-24 NOTE — Telephone Encounter (Signed)
done

## 2020-10-01 ENCOUNTER — Other Ambulatory Visit: Payer: Self-pay

## 2020-10-01 ENCOUNTER — Encounter: Payer: Self-pay | Admitting: Hematology and Oncology

## 2020-10-01 ENCOUNTER — Inpatient Hospital Stay: Payer: 59 | Attending: Hematology and Oncology

## 2020-10-01 ENCOUNTER — Telehealth: Payer: Self-pay | Admitting: Hematology and Oncology

## 2020-10-01 ENCOUNTER — Inpatient Hospital Stay (HOSPITAL_BASED_OUTPATIENT_CLINIC_OR_DEPARTMENT_OTHER): Payer: 59 | Admitting: Hematology and Oncology

## 2020-10-01 DIAGNOSIS — C8118 Nodular sclerosis classical Hodgkin lymphoma, lymph nodes of multiple sites: Secondary | ICD-10-CM

## 2020-10-01 DIAGNOSIS — F17210 Nicotine dependence, cigarettes, uncomplicated: Secondary | ICD-10-CM

## 2020-10-01 DIAGNOSIS — G8929 Other chronic pain: Secondary | ICD-10-CM | POA: Diagnosis not present

## 2020-10-01 DIAGNOSIS — M2550 Pain in unspecified joint: Secondary | ICD-10-CM | POA: Insufficient documentation

## 2020-10-01 DIAGNOSIS — R634 Abnormal weight loss: Secondary | ICD-10-CM | POA: Insufficient documentation

## 2020-10-01 LAB — CBC WITH DIFFERENTIAL/PLATELET
Abs Immature Granulocytes: 0.01 10*3/uL (ref 0.00–0.07)
Basophils Absolute: 0 10*3/uL (ref 0.0–0.1)
Basophils Relative: 1 %
Eosinophils Absolute: 0.2 10*3/uL (ref 0.0–0.5)
Eosinophils Relative: 3 %
HCT: 40.7 % (ref 36.0–46.0)
Hemoglobin: 14.2 g/dL (ref 12.0–15.0)
Immature Granulocytes: 0 %
Lymphocytes Relative: 30 %
Lymphs Abs: 1.9 10*3/uL (ref 0.7–4.0)
MCH: 34.5 pg — ABNORMAL HIGH (ref 26.0–34.0)
MCHC: 34.9 g/dL (ref 30.0–36.0)
MCV: 99 fL (ref 80.0–100.0)
Monocytes Absolute: 0.5 10*3/uL (ref 0.1–1.0)
Monocytes Relative: 8 %
Neutro Abs: 3.7 10*3/uL (ref 1.7–7.7)
Neutrophils Relative %: 58 %
Platelets: 214 10*3/uL (ref 150–400)
RBC: 4.11 MIL/uL (ref 3.87–5.11)
RDW: 12.2 % (ref 11.5–15.5)
WBC: 6.3 10*3/uL (ref 4.0–10.5)
nRBC: 0 % (ref 0.0–0.2)

## 2020-10-01 LAB — COMPREHENSIVE METABOLIC PANEL
ALT: 20 U/L (ref 0–44)
AST: 22 U/L (ref 15–41)
Albumin: 4.1 g/dL (ref 3.5–5.0)
Alkaline Phosphatase: 69 U/L (ref 38–126)
Anion gap: 12 (ref 5–15)
BUN: 17 mg/dL (ref 6–20)
CO2: 23 mmol/L (ref 22–32)
Calcium: 9.3 mg/dL (ref 8.9–10.3)
Chloride: 104 mmol/L (ref 98–111)
Creatinine, Ser: 1.23 mg/dL — ABNORMAL HIGH (ref 0.44–1.00)
GFR, Estimated: 59 mL/min — ABNORMAL LOW (ref >60)
Glucose, Bld: 80 mg/dL (ref 70–99)
Potassium: 4.2 mmol/L (ref 3.5–5.1)
Sodium: 139 mmol/L (ref 135–145)
Total Bilirubin: 0.9 mg/dL (ref 0.3–1.2)
Total Protein: 7.1 g/dL (ref 6.5–8.1)

## 2020-10-01 LAB — VITAMIN D 25 HYDROXY (VIT D DEFICIENCY, FRACTURES): Vit D, 25-Hydroxy: 43.82 ng/mL (ref 30–100)

## 2020-10-01 MED ORDER — HYDROXYZINE HCL 10 MG PO TABS: 10.0000 mg | ORAL_TABLET | Freq: Three times a day (TID) | ORAL | 0 refills | Status: DC | PRN

## 2020-10-01 NOTE — Assessment & Plan Note (Signed)
I spent some time counseling the patient the importance of tobacco cessation. We discussed common tobacco cessation strategies She is currently not interested to quit now.

## 2020-10-01 NOTE — Progress Notes (Signed)
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 We discussed common tobacco cessation strategies She is currently not interested to quit now.   Weight loss She had mild weight loss She claims it is because of her erratic eating habits However, cancer recurrence cannot be excluded I plan to check TSH in her next visit  Chronic joint pain She has chronic musculoskeletal pain since discontinuation of treatment She will continue prescribed tramadol We discussed narcotic refill policy I have checked vitamin D level We will call her with test results Vitamin D deficiency can cause worsening joint pain   Orders Placed This Encounter  Procedures  . TSH    Standing Status:   Standing    Number of Occurrences:   1    Standing Expiration Date:   10/01/2021    All questions were answered. The patient knows to call the clinic with any problems, questions or concerns. The total time spent in the appointment was 20 minutes encounter with patients including review of chart and various tests results, discussions about plan of care and  coordination of care plan   Heath Lark, MD 10/01/2020 9:02 AM  INTERVAL HISTORY: Please see below for problem oriented charting. She returns for lymphoma follow-up She is doing well except for weight loss She claims her weight loss is due to only eating 1 meal per day.  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   05/07/2014 Imaging   CT scan of the chest show right thoracic inlet lymphadenopathy   06/07/2014 Procedure   She underwent ultrasound-guided core biopsy of the neck lymph node   06/07/2014 Pathology Results   Accession: [id number] biopsy confirmed diagnosis of Hodgkin lymphoma.   06/15/2014 Imaging   Echocardiogram showed preserved ejection fraction   07/09/2014 - 07/12/2014 Hospital Admission   She was admitted to the hospital for severe anemia.   07/27/2014 Procedure   She had placement of port   07/31/2014 - 09/11/2014 Chemotherapy   She received dose adjusted chemotherapy due to abnormal liver function tests and severe anemia. Treatment was delayed due to noncompliance  and subsequently stopped because the patient failed to keep appointments   01/11/2015 Imaging   Repeat PET CT scan showed response to treatment   01/28/2015 - 06/18/2015 Chemotherapy   ABVD was restarted with full dose.   02/08/2015 - 02/10/2015 Hospital Admission   The patient was admitted to the hospital due to  pancytopenia and profuse diarrhea. Cultures were negative. She was placed on ciprofloxacin.   02/11/2015 Adverse Reaction   Treatment was placed on hold due to recent infection.   04/11/2015 Imaging   PET CT scan showed near complete response. Incidental finding of an abnormal bone  lesion, indeterminate. She is not symptomatic. Recommendation from Hem [facility] to observe   07/11/2015 Imaging   PET CT scan showed abnormal new bone lesions, suggestive of possible disease progression   07/23/2015 Bone Marrow Biopsy   She underwent bone biopsy   07/23/2015 Pathology Results   Accession: [id number]  biopsy was negative for cancer   11/21/2015 Surgery   She had surgery for ectopic pregnancy   01/29/2016 Imaging   Ct chest, abdomen and pelvis showed pelvic and retroperitoneal lymphadenopathy, as above, concerning for residual disease. There is also a mildly enlarged posterior mediastinal lymph node measuring 11 mm adjacent to the distal descending thoracic aorta. This may represent an additional focus of disease, but is the only finding of concern in the thorax on today's examination. Sclerosis in the right ilium at site of previously noted metabolically active lesion, grossly unchanged. No other definite osseous lesions are identified on today's examination. Spleen is normal in size and appearance.   02/14/2016 PET scan   Interval disease worsening with new foci of hypermetabolic activity in multiple retroperitoneal and pelvic lymph nodes as well as AP window and left hilar lymph nodes. (Deauville 5). There is also overall worsening of the osseous disease.   05/11/2016 Pathology Results   Diagnosis Lymph node, needle/core biopsy, Left para-aortic retroperitoneal - CLASSICAL HODGKIN LYMPHOMA. - SEE ONCOLOGY TABLE. Microscopic Comment LYMPHOMA Histologic type: Classical Hodgkin lymphoma. Grade (if applicable): N/A Flow cytometry: Not done. Immunohistochemical stains: CD15, CD20, CD3, LCA, PAX-5, CD30 with appropriate controls. Touch preps/imprints: Not performed. Comments: The sections show small needle core biopsy fragments displaying a polymorphous cellular proliferation of small lymphocytes, plasma cells, eosinophils, and large atypical mononuclear and multilobated lymphoid cells  with features of Reed-Sternberg cells and variants. This is associated with patchy areas of fibrosis. Immunohistochemical stains were performed and show that the large atypical lymphoid cells are positive for CD30, CD15 and PAX-5 and negative for LCA, CD20, CD3. The small lymphoid cells in the background show a mixture of T and B cells with predominance of T cells. The overall morphologic and histologic features are consistent with classical Hodgkin lymphoma. Further subtyping is challenging in limited small biopsy fragments but the patchy fibrosis suggests nodular sclerosis type.    05/11/2016 Procedure   She underwent CT guided biopsy of retroperitoneal lymph node   05/26/2016 Procedure   Successful placement of a right IJ approach Power Port with ultrasound and fluoroscopic guidance. The catheter is ready for use.   06/02/2016 PET scan   Mixed response to chemotherapy with some lymph nodes decreased in metabolic activity and some lymph nodes increase metabolic activity. Lymph node stations including mediastinum, periaortic retroperitoneum, and obturator node stations. Activity is remains relatively intense Deauville 4 2. LEFT infrahilar nodule / lymph node with intense metabolic activity decreased from prior. ( Deauville 4 ). 3. 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 Deauville 5   06/03/2016 - 06/05/2016 Hospital Admission   She was admitted to the hospital for cycle 1 of ICE chemotherapy   06/24/2016 - 06/26/2016 Hospital Admission   She received cycle 2 of ICE chemo   07/07/2016 PET scan   Resolution of prior hypermetabolic adenopathy and resolution of prior osseous foci  of hypermetabolic activity compatible with essentially complete response to therapy (Deauville 1). 2. Generalized reduced activity in the L4 vertebral body and the type of finding which would typically reflect prior radiation therapy. 3. Stable septated fatty right pelvic lesion, possibly a dermoid or  lipoma, not hypermetabolic   4/97/5300 PET scan   Hypermetabolic lesion along the L3 vertebral body and left posterior elements, max SUV 7.8 (Deauville 5). Hypermetabolic lesion along the left inferior pubic ramus, max SUV 4.9 (Deauville 4).  IMPRESSION: Prevascular lymphadenopathy, reflecting nodal recurrence (Deauville 4). Hypermetabolic osseous metastases involving the L3 vertebral body/posterior elements and left inferior pubic ramus (Deauville 4-5). Hypermetabolism along the endometrium, new, possibly reactive/physiologic. Consider pelvic ultrasound and/or endometrial sampling as clinically warranted.   11/04/2016 - 02/24/2017 Chemotherapy   She received Brentuximab   12/11/2016 PET scan   1. Mixed response to therapy within the skeleton. Lesions at L3 is decreased in size and metabolic activity. Residual activity remains above liver ( Deauville 4) 2. Increased activity in the LEFT sacrum with metabolic activity above liver activity ( Deauville 4 3. Decrease in size and metabolic activity of anterior mediastinal tissue consistent with resolution of thymic hyperplasia or resolution of lymphoma metabolic activity ( Deauville 2). 4. No new lymphadenopathy. Normal spleen and liver.   03/25/2017 PET scan   1. Increased metabolic activity in several normal sized retroperitoneal and right pelvic lymph nodes, Deauville 4. 2. Increase in size and metabolic activity of lesions in the L3 and L4 vertebral bodies and in the left sacrum, Deauville 5. 3. No recurrence of anterior mediastinal abnormal activity. 4. Other imaging findings of potential clinical significance: Chronic ethmoid and left maxillary sinusitis. Paraseptal emphysema at the lung apices. Right ovarian dermoid.   04/06/2017 -  Chemotherapy   The patient had Keytruda   04/06/2017 - 05/23/2019 Chemotherapy   The patient had pembrolizumab (KEYTRUDA) 200 mg in sodium chloride 0.9 % 50 mL chemo infusion, 200 mg, Intravenous, Once, 30 of  31 cycles Administration: 200 mg (04/06/2017), 200 mg (04/29/2017), 200 mg (05/20/2017), 200 mg (06/10/2017), 200 mg (07/01/2017), 200 mg (07/22/2017), 200 mg (08/12/2017), 200 mg (09/02/2017), 200 mg (10/04/2017), 200 mg (11/03/2017), 200 mg (12/02/2017), 200 mg (12/30/2017), 200 mg (01/27/2018), 200 mg (02/24/2018), 200 mg (03/22/2018), 200 mg (04/19/2018), 200 mg (05/17/2018), 200 mg (06/14/2018), 200 mg (07/12/2018), 200 mg (08/09/2018), 200 mg (09/06/2018), 200 mg (10/04/2018), 200 mg (11/01/2018), 200 mg (11/29/2018), 200 mg (12/27/2018), 200 mg (01/24/2019), 200 mg (03/14/2019), 200 mg (05/23/2019)  for chemotherapy treatment.    06/09/2017 PET scan   1. Overall significant improvement with reduction in nodal activity. Several retroperitoneal nodes which were previously Deauville 4 are currently Deauville 3. The right common iliac lymph node remains at Deauville 4 with maximum SUV of 3.4, but is improved from prior SUV of 4.3. 2. The bony lesions show the greatest improvement, with the previously Deauville 5 hypermetabolic lesions currently no longer of higher metabolic activity than surrounding bone marrow, currently measuring at Deauville 3. 3. 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 4. No new lesions are identified. 5. Emphysema (ICD10-J43.9).   08/05/2017 Imaging   1. Somewhat complex left adnexal lesion, potentially hemorrhagic cyst measuring 2.9 x 2.8 cm. The possibility of torsion in the left ovary must be considered. Advise correlation with pelvic ultrasound including Doppler assessment to further evaluate.  2.  Right ovarian dermoid, unchanged from recent PET-CT examination.  3. No adenopathy by size criteria evident. Appearance of subcentimeter mesenteric lymph nodes  is stable compared to recent study.  4. No bone lesions appreciable by CT. Areas of abnormal radiotracer uptake in the mid lumbar spine noted on recent PET-CT examination. No destruction 2 or lytic change  noted in the lumbar vertebrae currently there.  5. No bowel obstruction. No abscess. No periappendiceal region inflammation.  6.  Small hiatal hernia with fluid in distal esophagus.  7.  Foci of coronary artery calcification, advanced for age.  8.  Spleen normal in size and contour.  9. Small hiatal hernia with mild fluid in the distal esophagus, likely indicative of a degree of reflux.   01/04/2018 Imaging   1. No findings to suggest residual/recurrent lymphoma in the chest, abdomen or pelvis. 2. Right ovarian dermoid slightly larger than prior examinations, currently measuring 3.8 x 2.8 x 3.1 cm. 3. Additional incidental findings, as above.   07/11/2018 Imaging   No findings suspicious for recurrent lymphoma. Spleen is normal in size.  Stable right ovarian dermoid.  4 mm subpleural nodular opacity in the posterior right lower lobe, favoring subpleural atelectasis.   07/17/2019 Imaging   1. Stable exam. No new or progressive findings to suggest recurrent disease. 2. Stable right adnexal dermoid. 3. Emphysema (ICD10-J43.9).   09/19/2019 Procedure   Successful right IJ vein Port-A-Cath explant.   12/05/2019 Imaging   1. Stable exam. No findings to suggest recurrent lymphoma. 2. 9 mm left para-aortic node is upper normal for size and mildly increased from 7 mm previously. Attention on follow-up recommended.  3. Stable right adnexal dermoid. 4. Emphysema (ICD10-J43.9).       REVIEW OF SYSTEMS:   Constitutional: Denies fevers, chills  Eyes: Denies blurriness of vision Ears, nose, mouth, throat, and face: Denies mucositis or sore throat Respiratory: Denies cough, dyspnea or wheezes Cardiovascular: Denies palpitation, chest discomfort or lower extremity swelling Gastrointestinal:  Denies nausea, heartburn or change in bowel habits Skin: Denies abnormal skin rashes Lymphatics: Denies new lymphadenopathy or easy bruising Neurological:Denies numbness, tingling or new  weaknesses Behavioral/Psych: Mood is stable, no new changes  All other systems were reviewed with the patient and are negative.  I have reviewed the past medical history, past surgical history, social history and family history with the patient and they are unchanged from previous note.  ALLERGIES:  is allergic to temazepam.  MEDICATIONS:  Current Outpatient Medications  Medication Sig Dispense Refill  . atazanavir (REYATAZ) 300 MG capsule TAKE ONE CAPSULE BY MOUTH DAILY WITH BREAKFAST. TAKE WITH NORVIR. 90 capsule 0  . cholecalciferol (VITAMIN D) 1000 units tablet Take 1 tablet (1,000 Units total) by mouth daily. 30 tablet 9  . emtricitabine-tenofovir (TRUVADA) 200-300 MG tablet Take 1 tablet by mouth daily. 90 tablet 0  . hydrOXYzine (ATARAX/VISTARIL) 10 MG tablet Take 1 tablet (10 mg total) by mouth 3 (three) times daily as needed. 90 tablet 0  . lidocaine (XYLOCAINE) 5 % ointment Apply 1 application topically as needed. Will numb skin for cleaning and urination 30 g 0  . metaxalone (SKELAXIN) 800 MG tablet Take 0.5 tablets (400 mg total) by mouth 3 (three) times daily. Start by taking 1/2 a pill. Don't drive after taking this medication, as it could make you sleepy. You can increase to 1 pill if you tolerate 1/2 pill well and need additional relief. 21 tablet 0  . ritonavir (NORVIR) 100 MG TABS tablet TAKE 1 TABLET BY MOUTH EVERY MORNING WITH BREAKFAST, TAKE WITH REYATAZ 90 tablet 0  . traMADol (ULTRAM) 50 MG tablet Take 2 tablets (100  mg total) by mouth every 6 (six) hours as needed. 90 tablet 0   No current facility-administered medications for this visit.    PHYSICAL EXAMINATION: ECOG PERFORMANCE STATUS: 1 - Symptomatic but completely ambulatory  Vitals:   10/01/20 0753  BP: 105/71  Pulse: (!) 57  Resp: 18  Temp: 97.6 F (36.4 C)  SpO2: 98%   Filed Weights   10/01/20 0753  Weight: 107 lb 9.6 oz (48.8 kg)    GENERAL:alert, no distress and comfortable SKIN: skin color,  texture, turgor are normal, no rashes or significant lesions EYES: normal, Conjunctiva are pink and non-injected, sclera clear OROPHARYNX:no exudate, no erythema and lips, buccal mucosa, and tongue normal  NECK: supple, thyroid normal size, non-tender, without nodularity LYMPH:  no palpable lymphadenopathy in the cervical, axillary or inguinal LUNGS: clear to auscultation and percussion with normal breathing effort HEART: regular rate & rhythm and no murmurs and no lower extremity edema ABDOMEN:abdomen soft, non-tender and normal bowel sounds Musculoskeletal:no cyanosis of digits and no clubbing  NEURO: alert & oriented x 3 with fluent speech, no focal motor/sensory deficits  LABORATORY DATA:  I have reviewed the data as listed    Component Value Date/Time   NA 139 10/01/2020 0730   NA 140 04/29/2017 0745   K 4.2 10/01/2020 0730   K 3.3 (L) 04/29/2017 0745   CL 104 10/01/2020 0730   CO2 23 10/01/2020 0730   CO2 23 04/29/2017 0745   GLUCOSE 80 10/01/2020 0730   GLUCOSE 83 04/29/2017 0745   BUN 17 10/01/2020 0730   BUN 10.7 04/29/2017 0745   CREATININE 1.23 (H) 10/01/2020 0730   CREATININE 1.2 (H) 04/29/2017 0745   CALCIUM 9.3 10/01/2020 0730   CALCIUM 9.8 04/29/2017 0745   PROT 7.1 10/01/2020 0730   PROT 7.6 04/29/2017 0745   ALBUMIN 4.1 10/01/2020 0730   ALBUMIN 4.0 04/29/2017 0745   AST 22 10/01/2020 0730   AST 19 04/29/2017 0745   ALT 20 10/01/2020 0730   ALT 19 04/29/2017 0745   ALKPHOS 69 10/01/2020 0730   ALKPHOS 81 04/29/2017 0745   BILITOT 0.9 10/01/2020 0730   BILITOT 1.31 (H) 04/29/2017 0745   GFRNONAA 59 (L) 10/01/2020 0730   GFRNONAA 83 02/20/2016 0908   GFRAA >60 11/07/2019 0740   GFRAA >89 02/20/2016 0908    No results found for: SPEP, UPEP  Lab Results  Component Value Date   WBC 6.3 10/01/2020   NEUTROABS 3.7 10/01/2020   HGB 14.2 10/01/2020   HCT 40.7 10/01/2020   MCV 99.0 10/01/2020   PLT 214 10/01/2020      Chemistry      Component Value  Date/Time   NA 139 10/01/2020 0730   NA 140 04/29/2017 0745   K 4.2 10/01/2020 0730   K 3.3 (L) 04/29/2017 0745   CL 104 10/01/2020 0730   CO2 23 10/01/2020 0730   CO2 23 04/29/2017 0745   BUN 17 10/01/2020 0730   BUN 10.7 04/29/2017 0745   CREATININE 1.23 (H) 10/01/2020 0730   CREATININE 1.2 (H) 04/29/2017 0745      Component Value Date/Time   CALCIUM 9.3 10/01/2020 0730   CALCIUM 9.8 04/29/2017 0745   ALKPHOS 69 10/01/2020 0730   ALKPHOS 81 04/29/2017 0745   AST 22 10/01/2020 0730   AST 19 04/29/2017 0745   ALT 20 10/01/2020 0730   ALT 19 04/29/2017 0745   BILITOT 0.9 10/01/2020 0730   BILITOT 1.31 (H) 04/29/2017 0745

## 2020-10-01 NOTE — Telephone Encounter (Signed)
Scheduled appointments per 06/06 sch msg. Patient is aware.

## 2020-10-01 NOTE — Assessment & Plan Note (Signed)
She has chronic musculoskeletal pain since discontinuation of treatment She will continue prescribed tramadol We discussed narcotic refill policy I have checked vitamin D level We will call her with test results Vitamin D deficiency can cause worsening joint pain

## 2020-10-01 NOTE — Assessment & Plan Note (Signed)
She had mild weight loss She claims it is because of her erratic eating habits However, cancer recurrence cannot be excluded I plan to check TSH in her next visit

## 2020-10-01 NOTE — Assessment & Plan Note (Signed)
Clinically, she has no signs of lymphoma recurrence We discussed the risks of recurrence and signs and symptoms to watch out for I am very concerned about her weight loss The patient claims she works too hard and is not vigilant about eating regularly I told her, she continues with trajectory of weight loss, I will scan her in 3 months

## 2020-10-04 ENCOUNTER — Telehealth: Payer: Self-pay

## 2020-10-04 ENCOUNTER — Other Ambulatory Visit: Payer: Self-pay | Admitting: Hematology and Oncology

## 2020-10-04 DIAGNOSIS — B20 Human immunodeficiency virus [HIV] disease: Secondary | ICD-10-CM

## 2020-10-04 MED ORDER — TRAMADOL HCL 50 MG PO TABS: 100.0000 mg | ORAL_TABLET | Freq: Four times a day (QID) | ORAL | 0 refills | Status: DC | PRN

## 2020-10-04 NOTE — Telephone Encounter (Signed)
done

## 2020-10-04 NOTE — Telephone Encounter (Signed)
Called and told Rx sent. She verbalized understanding.  

## 2020-10-04 NOTE — Telephone Encounter (Signed)
She called and left a message requesting refill on Tramadol Rx.  

## 2020-10-15 ENCOUNTER — Telehealth: Payer: Self-pay

## 2020-10-15 ENCOUNTER — Other Ambulatory Visit: Payer: Self-pay | Admitting: Hematology and Oncology

## 2020-10-15 DIAGNOSIS — B20 Human immunodeficiency virus [HIV] disease: Secondary | ICD-10-CM

## 2020-10-15 MED ORDER — TRAMADOL HCL 50 MG PO TABS: 100.0000 mg | ORAL_TABLET | Freq: Four times a day (QID) | ORAL | 0 refills | Status: DC | PRN

## 2020-10-15 MED ORDER — ONDANSETRON HCL 8 MG PO TABS: 8.0000 mg | ORAL_TABLET | Freq: Three times a day (TID) | ORAL | 3 refills | Status: DC | PRN

## 2020-10-15 NOTE — Telephone Encounter (Signed)
She called and left a message requesting a refill of nausea medication.  Called and left a message asking her to call the office back to clarify the name of the medication.

## 2020-10-15 NOTE — Telephone Encounter (Signed)
done

## 2020-10-15 NOTE — Telephone Encounter (Signed)
Called and left message Rx sent. 

## 2020-10-15 NOTE — Telephone Encounter (Signed)
She called back and left a message. She needs a refill on Tramadol Rx and Zofran.  She ask that the Rx's be sent to [facility].

## 2020-10-25 ENCOUNTER — Encounter: Payer: Self-pay | Admitting: Hematology and Oncology

## 2020-10-29 ENCOUNTER — Other Ambulatory Visit: Payer: Self-pay | Admitting: Hematology and Oncology

## 2020-10-29 ENCOUNTER — Telehealth: Payer: Self-pay

## 2020-10-29 DIAGNOSIS — B20 Human immunodeficiency virus [HIV] disease: Secondary | ICD-10-CM

## 2020-10-29 MED ORDER — TRAMADOL HCL 50 MG PO TABS: 100.0000 mg | ORAL_TABLET | Freq: Four times a day (QID) | ORAL | 0 refills | Status: DC | PRN

## 2020-10-29 NOTE — Telephone Encounter (Signed)
done

## 2020-10-29 NOTE — Telephone Encounter (Signed)
She called and left a message requesting a Tramadol refill. 

## 2020-10-29 NOTE — Telephone Encounter (Signed)
Called and left a message refill sent. Ask her to call the office back for questions.

## 2020-11-10 ENCOUNTER — Other Ambulatory Visit: Payer: Self-pay | Admitting: Hematology and Oncology

## 2020-11-10 DIAGNOSIS — B20 Human immunodeficiency virus [HIV] disease: Secondary | ICD-10-CM

## 2020-11-11 ENCOUNTER — Other Ambulatory Visit: Payer: Self-pay | Admitting: Hematology and Oncology

## 2020-11-11 ENCOUNTER — Encounter: Payer: Self-pay | Admitting: Hematology and Oncology

## 2020-11-19 ENCOUNTER — Encounter: Payer: Self-pay | Admitting: Internal Medicine

## 2020-11-19 ENCOUNTER — Ambulatory Visit (INDEPENDENT_AMBULATORY_CARE_PROVIDER_SITE_OTHER): Payer: 59 | Admitting: Internal Medicine

## 2020-11-19 ENCOUNTER — Ambulatory Visit (INDEPENDENT_AMBULATORY_CARE_PROVIDER_SITE_OTHER): Payer: 59

## 2020-11-19 ENCOUNTER — Other Ambulatory Visit: Payer: Self-pay

## 2020-11-19 VITALS — BP 109/69 | HR 82 | Temp 98.3°F | Wt 109.0 lb

## 2020-11-19 DIAGNOSIS — B20 Human immunodeficiency virus [HIV] disease: Secondary | ICD-10-CM

## 2020-11-19 DIAGNOSIS — Z113 Encounter for screening for infections with a predominantly sexual mode of transmission: Secondary | ICD-10-CM | POA: Diagnosis not present

## 2020-11-19 DIAGNOSIS — Z299 Encounter for prophylactic measures, unspecified: Secondary | ICD-10-CM

## 2020-11-19 DIAGNOSIS — R636 Underweight: Secondary | ICD-10-CM

## 2020-11-19 DIAGNOSIS — F17210 Nicotine dependence, cigarettes, uncomplicated: Secondary | ICD-10-CM | POA: Diagnosis not present

## 2020-11-19 DIAGNOSIS — N2889 Other specified disorders of kidney and ureter: Secondary | ICD-10-CM

## 2020-11-19 DIAGNOSIS — Z23 Encounter for immunization: Secondary | ICD-10-CM

## 2020-11-19 DIAGNOSIS — N182 Chronic kidney disease, stage 2 (mild): Secondary | ICD-10-CM | POA: Diagnosis not present

## 2020-11-19 MED ORDER — BICTEGRAVIR-EMTRICITAB-TENOFOV 50-200-25 MG PO TABS: 1.0000 | ORAL_TABLET | Freq: Every day | ORAL | 11 refills | Status: DC

## 2020-11-19 NOTE — Assessment & Plan Note (Signed)
Cessation was again discussed.

## 2020-11-19 NOTE — Progress Notes (Signed)
   Subjective:    Patient ID: [name], female    DOB: 1985-05-31, 35 y.o.   MRN: [id number]  HPI Here for follow up of HIV She continues on atazanavir, ritonavir and Truvada and denies any missed doses.  Her creat is a bit elevated to 1.23 last month.  She has no new issues. She continues to have problems gaining weight.  No new complaints.  Followed by Dr. Alvy Bimler for her nodular sclerosis Hodgkin lymphoma in remission.     Review of Systems  Constitutional:  Negative for chills and fever.  Gastrointestinal:  Negative for diarrhea and nausea.  Skin:  Negative for rash.      Objective:   Physical Exam Eyes:     General: No scleral icterus. Pulmonary:     Effort: Pulmonary effort is normal.  Neurological:     General: No focal deficit present.     Mental Status: She is alert.  Psychiatric:        Mood and Affect: Mood normal.    SH: + tobacco      Assessment & Plan:

## 2020-11-19 NOTE — Assessment & Plan Note (Signed)
She has a bit higher elevation of her creat and I discussed the results with her.  I discussed the issues with tenofovir in the form of TDF and will change her to a TAF-containing regimen.

## 2020-11-19 NOTE — Assessment & Plan Note (Signed)
I discussed treatment options with her including one pill a day regimens and changing Truvada to Descovy.  She is now interested in a one pill a day and has opted for biktarvy.  She understands the need to inform me if she becomes pregnant and possible need to change her regimen if so.   Labs today and rtc in 6 months

## 2020-11-19 NOTE — Assessment & Plan Note (Signed)
I encourage continued efforts to eat more

## 2020-11-19 NOTE — Progress Notes (Signed)
   Covid-19 Vaccination Clinic  Name:  [name]    MRN: [id number] DOB: 07-Aug-1985  11/19/2020  [name] was observed post Covid-19 immunization for 15 minutes without incident. She was provided with Vaccine Information Sheet and instruction to access the V-Safe system.   [name] was instructed to call 911 with any severe reactions post vaccine: Difficulty breathing  Swelling of face and throat  A fast heartbeat  A bad rash all over body  Dizziness and weakness   Immunizations Administered     Name Date Dose VIS Date Route   PFIZER Comrnaty(Gray TOP) Covid-19 Vaccine 11/19/2020  9:06 AM 0.3 mL 04/04/2020 Intramuscular   Manufacturer: [place]   Lot: [id number]   NDC: [id number]     Landis Gandy, RN

## 2020-11-19 NOTE — Addendum Note (Signed)
Addended by: Caffie Pinto on: 11/19/2020 09:36 AM   Modules accepted: Orders

## 2020-11-19 NOTE — Assessment & Plan Note (Signed)
Will screen today 

## 2020-11-19 NOTE — Assessment & Plan Note (Signed)
We will get her set up for a PAP smear, she is overdue.  I discussed birth control as well but she is not interested, not on any precautions.

## 2020-11-20 LAB — T-HELPER CELL (CD4) - (RCID CLINIC ONLY)
CD4 % Helper T Cell: 44 % (ref 33–65)
CD4 T Cell Abs: 672 /uL (ref 400–1790)

## 2020-11-22 ENCOUNTER — Telehealth: Payer: Self-pay

## 2020-11-22 ENCOUNTER — Other Ambulatory Visit: Payer: Self-pay | Admitting: Hematology and Oncology

## 2020-11-22 DIAGNOSIS — B20 Human immunodeficiency virus [HIV] disease: Secondary | ICD-10-CM

## 2020-11-22 MED ORDER — TRAMADOL HCL 50 MG PO TABS: ORAL_TABLET | ORAL | 0 refills | Status: DC

## 2020-11-22 NOTE — Telephone Encounter (Signed)
done

## 2020-11-22 NOTE — Telephone Encounter (Signed)
She called and left a message requesting Tramadol Refill sent to [facility].

## 2020-11-23 LAB — HIV-1 RNA QUANT-NO REFLEX-BLD
HIV 1 RNA Quant: NOT DETECTED Copies/mL
HIV-1 RNA Quant, Log: NOT DETECTED Log cps/mL

## 2020-11-23 LAB — RPR: RPR Ser Ql: NONREACTIVE

## 2020-12-06 ENCOUNTER — Telehealth: Payer: Self-pay

## 2020-12-06 ENCOUNTER — Other Ambulatory Visit: Payer: Self-pay | Admitting: Hematology and Oncology

## 2020-12-06 DIAGNOSIS — B20 Human immunodeficiency virus [HIV] disease: Secondary | ICD-10-CM

## 2020-12-06 MED ORDER — TRAMADOL HCL 50 MG PO TABS: ORAL_TABLET | ORAL | 0 refills | Status: DC

## 2020-12-06 NOTE — Telephone Encounter (Signed)
done

## 2020-12-06 NOTE — Telephone Encounter (Signed)
She called and left a message requesting Tramadol Rx.

## 2020-12-18 ENCOUNTER — Other Ambulatory Visit: Payer: Self-pay | Admitting: Hematology and Oncology

## 2020-12-18 ENCOUNTER — Other Ambulatory Visit: Payer: Self-pay

## 2020-12-18 DIAGNOSIS — B20 Human immunodeficiency virus [HIV] disease: Secondary | ICD-10-CM

## 2020-12-18 NOTE — Telephone Encounter (Signed)
Dr. Elson Areas, Anner would like her tramadol refilled.  Please advise.  Her pharmacy matches the one she left a message with. Gardiner Rhyme, RN

## 2020-12-19 ENCOUNTER — Encounter: Payer: Self-pay | Admitting: Hematology and Oncology

## 2020-12-19 ENCOUNTER — Ambulatory Visit (INDEPENDENT_AMBULATORY_CARE_PROVIDER_SITE_OTHER): Payer: 59 | Admitting: Infectious Diseases

## 2020-12-19 ENCOUNTER — Encounter: Payer: Self-pay | Admitting: Infectious Diseases

## 2020-12-19 ENCOUNTER — Other Ambulatory Visit: Payer: Self-pay

## 2020-12-19 ENCOUNTER — Other Ambulatory Visit (HOSPITAL_COMMUNITY)
Admission: RE | Admit: 2020-12-19 | Discharge: 2020-12-19 | Disposition: A | Discharge status: Home / Self Care | Payer: 59 | Source: Ambulatory Visit | Attending: Infectious Diseases | Admitting: Infectious Diseases

## 2020-12-19 ENCOUNTER — Telehealth: Payer: Self-pay

## 2020-12-19 DIAGNOSIS — Z124 Encounter for screening for malignant neoplasm of cervix: Secondary | ICD-10-CM | POA: Insufficient documentation

## 2020-12-19 DIAGNOSIS — N9089 Other specified noninflammatory disorders of vulva and perineum: Secondary | ICD-10-CM

## 2020-12-19 DIAGNOSIS — Z113 Encounter for screening for infections with a predominantly sexual mode of transmission: Secondary | ICD-10-CM

## 2020-12-19 DIAGNOSIS — N979 Female infertility, unspecified: Secondary | ICD-10-CM | POA: Diagnosis not present

## 2020-12-19 DIAGNOSIS — Z1151 Encounter for screening for human papillomavirus (HPV): Secondary | ICD-10-CM | POA: Insufficient documentation

## 2020-12-19 DIAGNOSIS — Z319 Encounter for procreative management, unspecified: Secondary | ICD-10-CM | POA: Diagnosis not present

## 2020-12-19 DIAGNOSIS — Z01419 Encounter for gynecological examination (general) (routine) without abnormal findings: Secondary | ICD-10-CM | POA: Diagnosis present

## 2020-12-19 DIAGNOSIS — B20 Human immunodeficiency virus [HIV] disease: Secondary | ICD-10-CM

## 2020-12-19 MED ORDER — IMIQUIMOD 5 % EX CREA: TOPICAL_CREAM | CUTANEOUS | 1 refills | Status: DC

## 2020-12-19 NOTE — Telephone Encounter (Signed)
Patient stated the OBGYN she was referred to this morning dow not take Medicaid - she needs another referral.  [phone].  Thanks

## 2020-12-19 NOTE — Assessment & Plan Note (Signed)
Appears more c/w condyloma acuminata. They do not have a blistered appearance and do not appear inflamed. No induration or concern for abscess. Will treat with Imiquimod cream 3x weekly to see if this helps. I asked her to please call if the lesion seems to be getting bigger.
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
## 2020-12-19 NOTE — Assessment & Plan Note (Signed)
Normal pelvic exam.  Cervical brushing collected for cytology and HPV.  Discussed recommended screening interval for women living with HIV disease meant to be lifelong and at an interval of Q1-3 years pending results. Acceptable to space out Q3y with 3 consecutively normal exams. Further recommendations for [name] to follow today's results.  Results will be communicated to the patient via telephone call.

## 2020-12-19 NOTE — Patient Instructions (Addendum)
Will call you with your pap smear results - probably early next week.  Will get you referred to [facility] for assistance achieving pregnancy.  In the mean time get any Prenatal Vitamin (anything generic, tablet, capsule or gummy) and start taking once a day. Try to separate it from your HIV medication by 6 hours to make sure they both absorb well.   For the cream will send in Imiquimod cream for you - use this three times a week (Monday, Wednesday and Friday) with a thin layer over the two spots you have that appear to be warts. It may take up to 4 months of use to see them go away but they are small enough that I hope you will notice improvement sooner.    Imiquimod skin cream What is this medication? IMIQUIMOD (i mi KWI mod) cream is used to treat external genital or anal warts. It is also used to treat other skin conditions such as actinic keratosis andcertain types of skin cancer. This medicine may be used for other purposes; ask your health care provider orpharmacist if you have questions. COMMON BRAND NAME(S): Aldara, Zyclara What should I tell my care team before I take this medication? They need to know if you have any of these conditions: decreased immune function an unusual or allergic reaction to imiquimod, other medicines, foods, dyes, or preservatives pregnant or trying to get pregnant breast-feeding How should I use this medication? This medicine is for external use only. Do not take by mouth. Follow the directions on the prescription label. Apply just before bedtime. Wash your hands before and after use. Apply a thin layer of cream and massage gently into the affected areas until no longer visible. Do not use in the mouth, eyes or the vagina. Use this medicine only on the affected area as directed by your health care provider. Do not use for longer than prescribed. It is important not to use more medicine than prescribed. To do so may increase the chance  ofside effects. Talk to your pediatrician regarding the use of this medicine in children. While this drug may be prescribed for children as young as 75 years of age forselected conditions, precautions do apply. Overdosage: If you think you have taken too much of this medicine contact apoison control center or emergency room at once. NOTE: This medicine is only for you. Do not share this medicine with others. What if I miss a dose? If you miss a dose, use it as soon as you can. If it is almost time for yournext dose, use only that dose. Do not use double or extra doses. What may interact with this medication? Interactions are not expected. Do not use any other medicines on the treatedarea without asking your doctor or health care professional. This list may not describe all possible interactions. Give your health care provider a list of all the medicines, herbs, non-prescription drugs, or dietary supplements you use. Also tell them if you smoke, drink alcohol, or use illegaldrugs. Some items may interact with your medicine. What should I watch for while using this medication? Visit your health care professional for regular checks on your progress. Do not use this medicine until the skin has healed from any other drug(example: podofilox or podophyllin resin) or surgical skin treatment. Females should receive regular pelvic exams while being treated for genital warts. Most patients see improvement within 4 weeks. It may take up to 16 weeks to see a full clearing of the  warts. This medicine is not a cure. New warts may develop during or after treatment. Avoid sexual (genital, anal, oral) contact while the cream is on the skin. If warts are visible in the genital area, sexual contact should be avoided until the warts are treated. The use of latex condoms during sexual contact may reduce, but not entirely prevent, infecting others. This medicine may weaken condoms, diaphragms, cervical caps or other barrier  devices and make them less effective as birth control. Do not cover the treated area with an airtight bandage. Cotton gauze dressings can be used. Cotton underwear can be worn after using this medicine on the genital or analarea. Actinic keratoses that were not seen before may appear during treatment and may later go away. The treatment area and surrounding area may lighten or darken after treatment with this medicine. These skin color changes may be permanentin some patients. If you experience a skin reaction at the treatment site that interferes or prevents you from doing any daily activity, contact your health care provider. You may need a rest period from treatment. Treatment may be restarted once the reaction has gotten better as recommended by your doctor or health careprofessional. This medicine can make you more sensitive to the sun. Keep out of the sun. If you cannot avoid being in the sun, wear protective clothing and use sunscreen.Do not use sun lamps or tanning beds/booths. What side effects may I notice from receiving this medication? Side effects that you should report to your doctor or health care professionalas soon as possible: open sores with or without drainage skin infection skin rash unusual or severe skin reaction Side effects that usually do not require medical attention (report to yourdoctor or health care professional if they continue or are bothersome): burning or itching redness of the skin (very common but is usually not painful or harmful) scabbing, crusting, or peeling skin skin that becomes hard or thickened swelling of the skin This list may not describe all possible side effects. Call your doctor for medical advice about side effects. You may report side effects to FDA at1-800-FDA-1088. Where should I keep my medication? Keep out of the reach of children. Store between 4 and 25 degrees C (39 and 77 degrees F). Do not freeze. Throw away any unused medicine after the  expiration date. Discard packet afterapplying to affected area. Partial packets should not be saved or reused. NOTE: This sheet is a summary. It may not cover all possible information. If you have questions about this medicine, talk to your doctor, pharmacist, orhealth care provider.  2022 Elsevier/Gold Standard (2008-03-27 10:33:25)

## 2020-12-19 NOTE — Assessment & Plan Note (Signed)
Recommended to separate prenatal vitamin 6h from ART to ensure adequate absorption.

## 2020-12-19 NOTE — Assessment & Plan Note (Signed)
H/O 2 miscarriages (last in 2020) with inability to achieve/maintain pregnancy > 4 years now. Will help with referral to [facility]/infertility for help. Recommended starting prenatal vitamin now.

## 2020-12-20 LAB — URINE CYTOLOGY ANCILLARY ONLY
Chlamydia: NEGATIVE
Comment: NEGATIVE
Comment: NEGATIVE
Comment: NORMAL
Neisseria Gonorrhea: NEGATIVE
Trichomonas: NEGATIVE

## 2020-12-20 LAB — CYTOLOGY - PAP
Comment: NEGATIVE
Diagnosis: NEGATIVE
High risk HPV: NEGATIVE

## 2020-12-20 NOTE — Progress Notes (Signed)
Please call [name] to let her know that her pap smear results are completely normal. She has no abnormal cells and no HPV (human papilloma virus) detected. I recommend she repeat this test again in 3 years (August 2025). If she prefer to continue annual testing we can help her with that as well.

## 2020-12-23 ENCOUNTER — Telehealth: Payer: Self-pay

## 2020-12-23 NOTE — Telephone Encounter (Signed)
-----   Message from [place] Callas, NP sent at 12/20/2020  9:23 PM EDT ----- Please call [name] to let her know that her pap smear results are completely normal. She has no abnormal cells and no HPV (human papilloma virus) detected. I recommend she repeat this test again in 3 years (August 2025). If she prefer to continue annual testing we can help her with that as well.

## 2020-12-23 NOTE — Telephone Encounter (Signed)
Spoke with patient, advised her per [name], NP that her pap smear results were completely normal with no concern for human papilloma virus. Recommended she proceed with routine screening every 3 years, but that she can also continue with yearly screening. Patient verbalized understanding.   She states the OBGYN she was referred to does not accept her medicaid. Provided her with the phone number for [facility].   Beryle Flock, RN

## 2020-12-31 ENCOUNTER — Inpatient Hospital Stay: Payer: 59 | Attending: Hematology and Oncology

## 2020-12-31 ENCOUNTER — Encounter: Payer: Self-pay | Admitting: Hematology and Oncology

## 2020-12-31 ENCOUNTER — Inpatient Hospital Stay (HOSPITAL_BASED_OUTPATIENT_CLINIC_OR_DEPARTMENT_OTHER): Payer: 59 | Admitting: Hematology and Oncology

## 2020-12-31 ENCOUNTER — Other Ambulatory Visit: Payer: Self-pay

## 2020-12-31 DIAGNOSIS — C8118 Nodular sclerosis classical Hodgkin lymphoma, lymph nodes of multiple sites: Secondary | ICD-10-CM

## 2020-12-31 DIAGNOSIS — M2550 Pain in unspecified joint: Secondary | ICD-10-CM | POA: Insufficient documentation

## 2020-12-31 DIAGNOSIS — G8929 Other chronic pain: Secondary | ICD-10-CM | POA: Insufficient documentation

## 2020-12-31 DIAGNOSIS — Z79899 Other long term (current) drug therapy: Secondary | ICD-10-CM | POA: Insufficient documentation

## 2020-12-31 DIAGNOSIS — B20 Human immunodeficiency virus [HIV] disease: Secondary | ICD-10-CM | POA: Diagnosis not present

## 2020-12-31 DIAGNOSIS — F17200 Nicotine dependence, unspecified, uncomplicated: Secondary | ICD-10-CM | POA: Diagnosis not present

## 2020-12-31 DIAGNOSIS — R634 Abnormal weight loss: Secondary | ICD-10-CM

## 2020-12-31 LAB — CBC WITH DIFFERENTIAL/PLATELET
Abs Immature Granulocytes: 0.02 10*3/uL (ref 0.00–0.07)
Basophils Absolute: 0 10*3/uL (ref 0.0–0.1)
Basophils Relative: 1 %
Eosinophils Absolute: 0.2 10*3/uL (ref 0.0–0.5)
Eosinophils Relative: 2 %
HCT: 39.8 % (ref 36.0–46.0)
Hemoglobin: 13.8 g/dL (ref 12.0–15.0)
Immature Granulocytes: 0 %
Lymphocytes Relative: 23 %
Lymphs Abs: 1.7 10*3/uL (ref 0.7–4.0)
MCH: 34.2 pg — ABNORMAL HIGH (ref 26.0–34.0)
MCHC: 34.7 g/dL (ref 30.0–36.0)
MCV: 98.5 fL (ref 80.0–100.0)
Monocytes Absolute: 0.7 10*3/uL (ref 0.1–1.0)
Monocytes Relative: 9 %
Neutro Abs: 5.1 10*3/uL (ref 1.7–7.7)
Neutrophils Relative %: 65 %
Platelets: 208 10*3/uL (ref 150–400)
RBC: 4.04 MIL/uL (ref 3.87–5.11)
RDW: 12.2 % (ref 11.5–15.5)
WBC: 7.7 10*3/uL (ref 4.0–10.5)
nRBC: 0 % (ref 0.0–0.2)

## 2020-12-31 LAB — COMPREHENSIVE METABOLIC PANEL
ALT: 22 U/L (ref 0–44)
AST: 20 U/L (ref 15–41)
Albumin: 4.1 g/dL (ref 3.5–5.0)
Alkaline Phosphatase: 63 U/L (ref 38–126)
Anion gap: 10 (ref 5–15)
BUN: 13 mg/dL (ref 6–20)
CO2: 25 mmol/L (ref 22–32)
Calcium: 10 mg/dL (ref 8.9–10.3)
Chloride: 105 mmol/L (ref 98–111)
Creatinine, Ser: 1.06 mg/dL — ABNORMAL HIGH (ref 0.44–1.00)
GFR, Estimated: >60 mL/min (ref >60)
Glucose, Bld: 74 mg/dL (ref 70–99)
Potassium: 4 mmol/L (ref 3.5–5.1)
Sodium: 140 mmol/L (ref 135–145)
Total Bilirubin: 0.4 mg/dL (ref 0.3–1.2)
Total Protein: 7.2 g/dL (ref 6.5–8.1)

## 2020-12-31 LAB — TSH: TSH: 2.226 u[IU]/mL (ref 0.308–3.960)

## 2020-12-31 MED ORDER — TRAMADOL HCL 50 MG PO TABS: 100.0000 mg | ORAL_TABLET | Freq: Four times a day (QID) | ORAL | 0 refills | Status: DC | PRN

## 2020-12-31 NOTE — Assessment & Plan Note (Signed)
Clinically, she has no signs of lymphoma recurrence We discussed the risks of recurrence and signs and symptoms to watch out for Her examination is benign I do not plan for surveillance imaging study unless she has clinical signs of disease progression or relapse

## 2020-12-31 NOTE — Assessment & Plan Note (Signed)
According to recent blood work, her HIV is under controlled She will continue antiretroviral therapy as directed by infectious disease team

## 2020-12-31 NOTE — Progress Notes (Signed)
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[place]) Clinically, she has no signs of lymphoma recurrence We discussed the risks of recurrence and signs and symptoms to watch out for Her examination is benign I do not plan for surveillance imaging study unless she has clinical signs of disease progression or relapse  Chronic joint pain She has chronic musculoskeletal pain since discontinuation of treatment She will continue prescribed tramadol We discussed narcotic refill policy  Human immunodeficiency virus (HIV) disease ([place]) According to recent blood work, her HIV is under controlled She will continue antiretroviral therapy as directed by infectious disease team  No orders of the defined types were placed in this encounter.   All questions were answered. The patient knows to call the clinic with any problems, questions or concerns. The total time spent in the appointment was 20 minutes encounter with patients including review of chart and various tests results, discussions about plan of care and coordination of care plan   Heath Lark, MD 12/31/2020 10:38 AM  INTERVAL HISTORY: Please see below for problem oriented charting. she returns for treatment follow-up on history of recurrent Hodgkin lymphoma She is doing well She is working full-time She denies recent infection No new lymphadenopathy She is gaining weight Unfortunately, she continues to smoke cigar periodically Her chronic joint pain is stable  REVIEW OF SYSTEMS:   Constitutional: Denies fevers,  chills or abnormal weight loss Eyes: Denies blurriness of vision Ears, nose, mouth, throat, and face: Denies mucositis or sore throat Respiratory: Denies cough, dyspnea or wheezes Cardiovascular: Denies palpitation, chest discomfort or lower extremity swelling Gastrointestinal:  Denies nausea, heartburn or change in bowel habits Skin: Denies abnormal skin rashes Lymphatics: Denies new lymphadenopathy or easy bruising Neurological:Denies numbness, tingling or new weaknesses Behavioral/Psych: Mood is stable, no new changes  All other systems were reviewed with the patient and are negative.  I have reviewed the past medical history, past surgical history, social history and family history with the patient and they are unchanged from previous note.  ALLERGIES:  is allergic to temazepam.  MEDICATIONS:  Current Outpatient Medications  Medication Sig Dispense Refill   bictegravir-emtricitabine-tenofovir AF (BIKTARVY) 50-200-25 MG TABS tablet Take 1 tablet by mouth daily. 30 tablet 11   cholecalciferol (VITAMIN D) 1000 units tablet Take 1 tablet (1,000 Units total) by mouth daily. 30 tablet 9   emtricitabine-tenofovir (TRUVADA) 200-300 MG tablet Take 1 tablet by mouth daily.     hydrOXYzine (ATARAX/VISTARIL) 10 MG tablet Take 1 tablet (10 mg total) by mouth 3 (three) times daily as needed. 90 tablet 0   imiquimod (ALDARA) 5 % cream Apply topically 3 (three) times a week. 12 each 1   metaxalone (SKELAXIN) 800 MG tablet Take 0.5 tablets (400 mg total) by mouth 3 (three) times daily. Start by taking 1/2 a pill. Don't drive after taking this medication, as it could make you sleepy. You can increase to 1 pill if you tolerate 1/2 pill well and need additional relief. 21 tablet 0   ondansetron (ZOFRAN) 8 MG tablet Take 1 tablet (8 mg total) by mouth every 8 (eight) hours as needed for nausea. 30 tablet 3  traMADol (ULTRAM) 50 MG tablet Take 2 tablets (100 mg total) by mouth every 6 (six) hours as needed.  90 tablet 0   No current facility-administered medications for this visit.    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   05/07/2014 Imaging   CT scan of the chest show right thoracic inlet lymphadenopathy   06/07/2014 Procedure   She underwent ultrasound-guided core biopsy of the neck lymph node   06/07/2014 Pathology Results   Accession: [id number] biopsy confirmed diagnosis of Hodgkin lymphoma.   06/15/2014 Imaging   Echocardiogram showed preserved ejection fraction   07/09/2014 - 07/12/2014 Hospital Admission   She was admitted to the hospital for severe anemia.   07/27/2014 Procedure   She had placement of port   07/31/2014 - 09/11/2014 Chemotherapy   She received dose adjusted chemotherapy due to abnormal liver function tests and severe anemia. Treatment was delayed due to noncompliance  and subsequently stopped because the patient failed to keep appointments   01/11/2015 Imaging   Repeat PET CT scan showed response to treatment   01/28/2015 - 06/18/2015 Chemotherapy   ABVD was restarted with full dose.   02/08/2015 - 02/10/2015 Hospital Admission   The patient was admitted to the hospital due to pancytopenia and profuse diarrhea. Cultures were negative. She was placed on ciprofloxacin.   02/11/2015 Adverse Reaction   Treatment was placed on hold due to recent infection.   04/11/2015 Imaging   PET CT scan showed near complete response. Incidental finding of an abnormal bone lesion, indeterminate. She is not symptomatic. Recommendation from Hem [facility] to observe   07/11/2015 Imaging   PET CT scan showed abnormal new bone lesions, suggestive of possible disease progression   07/23/2015 Bone Marrow Biopsy   She underwent bone biopsy   07/23/2015 Pathology Results   Accession: [id number]  biopsy was negative for cancer   11/21/2015 Surgery   She had  surgery for ectopic pregnancy   01/29/2016 Imaging   Ct chest, abdomen and pelvis showed pelvic and retroperitoneal lymphadenopathy, as above, concerning for residual disease. There is also a mildly enlarged posterior mediastinal lymph node measuring 11 mm adjacent to the distal descending thoracic aorta. This may represent an additional focus of disease, but is the only finding of concern in the thorax on today's examination. Sclerosis in the right ilium at site of previously noted metabolically active lesion, grossly unchanged. No other definite osseous lesions are identified on today's examination. Spleen is normal in size and appearance.   02/14/2016 PET scan   Interval disease worsening with new foci of hypermetabolic activity in multiple retroperitoneal and pelvic lymph nodes as well as AP window and left hilar lymph nodes. (Deauville 5). There is also overall worsening of the osseous disease.   05/11/2016 Pathology Results   Diagnosis Lymph node, needle/core biopsy, Left para-aortic retroperitoneal - CLASSICAL HODGKIN LYMPHOMA. - SEE ONCOLOGY TABLE. Microscopic Comment LYMPHOMA Histologic type: Classical Hodgkin lymphoma. Grade (if applicable): N/A Flow cytometry: Not done. Immunohistochemical stains: CD15, CD20, CD3, LCA, PAX-5, CD30 with appropriate controls. Touch preps/imprints: Not performed. Comments: The sections show small needle core biopsy fragments displaying a polymorphous cellular proliferation of small lymphocytes, plasma cells, eosinophils, and large atypical mononuclear and multilobated lymphoid cells with features of Reed-Sternberg cells and variants. This is associated with patchy areas of fibrosis. Immunohistochemical stains were performed and show that the large atypical lymphoid cells are  positive for CD30, CD15 and PAX-5 and negative for LCA, CD20, CD3. The small lymphoid cells in the background show a mixture of T and B cells with predominance of T cells. The overall  morphologic and histologic features are consistent with classical Hodgkin lymphoma. Further subtyping is challenging in limited small biopsy fragments but the patchy fibrosis suggests nodular sclerosis type.    05/11/2016 Procedure   She underwent CT guided biopsy of retroperitoneal lymph node   05/26/2016 Procedure   Successful placement of a right IJ approach Power Port with ultrasound and fluoroscopic guidance. The catheter is ready for use.   06/02/2016 PET scan   Mixed response to chemotherapy with some lymph nodes decreased in metabolic activity and some lymph nodes increase metabolic activity. Lymph node stations including mediastinum, periaortic retroperitoneum, and obturator node stations. Activity is remains relatively intense Deauville 4 2. LEFT infrahilar nodule / lymph node with intense metabolic activity decreased from prior. ( Deauville 4 ). 3. 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 Deauville 5   06/03/2016 - 06/05/2016 Hospital Admission   She was admitted to the hospital for cycle 1 of ICE chemotherapy   06/24/2016 - 06/26/2016 Hospital Admission   She received cycle 2 of ICE chemo   07/07/2016 PET scan   Resolution of prior hypermetabolic adenopathy and resolution of prior osseous foci of hypermetabolic activity compatible with essentially complete response to therapy (Deauville 1). 2. Generalized reduced activity in the L4 vertebral body and the type of finding which would typically reflect prior radiation therapy. 3. Stable septated fatty right pelvic lesion, possibly a dermoid or lipoma, not hypermetabolic   10/22/3660 PET scan   Hypermetabolic lesion along the L3 vertebral body and left posterior elements, max SUV 7.8 (Deauville 5). Hypermetabolic lesion along the left inferior pubic ramus, max SUV 4.9 (Deauville 4).  IMPRESSION: Prevascular lymphadenopathy, reflecting nodal recurrence (Deauville 4). Hypermetabolic osseous metastases involving the  L3 vertebral body/posterior elements and left inferior pubic ramus (Deauville 4-5). Hypermetabolism along the endometrium, new, possibly reactive/physiologic. Consider pelvic ultrasound and/or endometrial sampling as clinically warranted.   11/04/2016 - 02/24/2017 Chemotherapy   She received Brentuximab   12/11/2016 PET scan   1. Mixed response to therapy within the skeleton. Lesions at L3 is decreased in size and metabolic activity. Residual activity remains above liver ( Deauville 4) 2. Increased activity in the LEFT sacrum with metabolic activity above liver activity ( Deauville 4 3. Decrease in size and metabolic activity of anterior mediastinal tissue consistent with resolution of thymic hyperplasia or resolution of lymphoma metabolic activity ( Deauville 2). 4. No new lymphadenopathy.  Normal spleen and liver.   03/25/2017 PET scan   1. Increased metabolic activity in several normal sized retroperitoneal and right pelvic lymph nodes, Deauville 4. 2. Increase in size and metabolic activity of lesions in the L3 and L4 vertebral bodies and in the left sacrum, Deauville 5. 3. No recurrence of anterior mediastinal abnormal activity. 4. Other imaging findings of potential clinical significance: Chronic ethmoid and left maxillary sinusitis. Paraseptal emphysema at the lung apices. Right ovarian dermoid.   04/06/2017 -  Chemotherapy   The patient had Keytruda   04/06/2017 - 05/23/2019 Chemotherapy   The patient had pembrolizumab (KEYTRUDA) 200 mg in sodium chloride 0.9 % 50 mL chemo infusion, 200 mg, Intravenous, Once, 30 of 31 cycles Administration: 200 mg (04/06/2017), 200 mg (04/29/2017), 200 mg (05/20/2017), 200 mg (06/10/2017), 200 mg (07/01/2017), 200 mg (07/22/2017), 200 mg (08/12/2017), 200 mg (09/02/2017), 200  mg (10/04/2017), 200 mg (11/03/2017), 200 mg (12/02/2017), 200 mg (12/30/2017), 200 mg (01/27/2018), 200 mg (02/24/2018), 200 mg (03/22/2018), 200 mg (04/19/2018), 200 mg (05/17/2018), 200 mg  (06/14/2018), 200 mg (07/12/2018), 200 mg (08/09/2018), 200 mg (09/06/2018), 200 mg (10/04/2018), 200 mg (11/01/2018), 200 mg (11/29/2018), 200 mg (12/27/2018), 200 mg (01/24/2019), 200 mg (03/14/2019), 200 mg (05/23/2019)   for chemotherapy treatment.     06/09/2017 PET scan   1. Overall significant improvement with reduction in nodal activity. Several retroperitoneal nodes which were previously Deauville 4 are currently Deauville 3. The right common iliac lymph node remains at Deauville 4 with maximum SUV of 3.4, but is improved from prior SUV of 4.3. 2. The bony lesions show the greatest improvement, with the previously Deauville 5 hypermetabolic lesions currently no longer of higher metabolic activity than surrounding bone marrow, currently measuring at Deauville 3. 3. 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 4. No new lesions are identified. 5.  Emphysema (ICD10-J43.9).   08/05/2017 Imaging   1. Somewhat complex left adnexal lesion, potentially hemorrhagic cyst measuring 2.9 x 2.8 cm. The possibility of torsion in the left ovary must be considered. Advise correlation with pelvic ultrasound including Doppler assessment to further evaluate.   2.  Right ovarian dermoid, unchanged from recent PET-CT examination.   3. No adenopathy by size criteria evident. Appearance of subcentimeter mesenteric lymph nodes is stable compared to recent study.   4. No bone lesions appreciable by CT. Areas of abnormal radiotracer uptake in the mid lumbar spine noted on recent PET-CT examination. No destruction 2 or lytic change noted in the lumbar vertebrae currently there.   5. No bowel obstruction. No abscess. No periappendiceal region inflammation.   6.  Small hiatal hernia with fluid in distal esophagus.   7.  Foci of coronary artery calcification, advanced for age.   8.  Spleen normal in size and contour.   9. Small hiatal hernia with mild fluid in the distal esophagus,  likely indicative of a degree of reflux.   01/04/2018 Imaging   1. No findings to suggest residual/recurrent lymphoma in the chest, abdomen or pelvis. 2. Right ovarian dermoid slightly larger than prior examinations, currently measuring 3.8 x 2.8 x 3.1 cm. 3. Additional incidental findings, as above.   07/11/2018 Imaging   No findings suspicious for recurrent lymphoma. Spleen is normal in size.   Stable right ovarian dermoid.   4 mm subpleural nodular opacity in the posterior right lower lobe, favoring subpleural atelectasis.   07/17/2019 Imaging   1. Stable exam. No new or progressive findings to suggest recurrent disease. 2. Stable right adnexal dermoid. 3. Emphysema (ICD10-J43.9).   09/19/2019 Procedure   Successful right IJ vein Port-A-Cath explant.   12/05/2019 Imaging   1. Stable exam. No findings to suggest recurrent lymphoma. 2. 9 mm left para-aortic node is upper normal for size and mildly increased from 7 mm previously. Attention on follow-up recommended.  3. Stable right adnexal dermoid. 4. Emphysema (ICD10-J43.9).       PHYSICAL EXAMINATION: ECOG PERFORMANCE STATUS: 1 - Symptomatic but completely ambulatory  Vitals:   12/31/20 0827  BP: 119/70  Pulse: 72  Resp: 18  Temp: 98.5 F (36.9 C)  SpO2: 91%   Filed Weights   12/31/20 0827  Weight: 111 lb 12.8 oz (50.7 kg)    GENERAL:alert, no distress and comfortable SKIN: skin color, texture, turgor are normal, no rashes or significant lesions EYES: normal, Conjunctiva are pink and non-injected, sclera clear  OROPHARYNX:no exudate, no erythema and lips, buccal mucosa, and tongue normal  NECK: supple, thyroid normal size, non-tender, without nodularity LYMPH:  no palpable lymphadenopathy in the cervical, axillary or inguinal LUNGS: clear to auscultation and percussion with normal breathing effort HEART: regular rate & rhythm and no murmurs and no lower extremity edema ABDOMEN:abdomen soft, non-tender and normal  bowel sounds Musculoskeletal:no cyanosis of digits and no clubbing  NEURO: alert & oriented x 3 with fluent speech, no focal motor/sensory deficits  LABORATORY DATA:  I have reviewed the data as listed    Component Value Date/Time   NA 140 12/31/2020 0809   NA 140 04/29/2017 0745   K 4.0 12/31/2020 0809   K 3.3 (L) 04/29/2017 0745   CL 105 12/31/2020 0809   CO2 25 12/31/2020 0809   CO2 23 04/29/2017 0745   GLUCOSE 74 12/31/2020 0809   GLUCOSE 83 04/29/2017 0745   BUN 13 12/31/2020 0809   BUN 10.7 04/29/2017 0745   CREATININE 1.06 (H) 12/31/2020 0809   CREATININE 1.2 (H) 04/29/2017 0745   CALCIUM 10.0 12/31/2020 0809   CALCIUM 9.8 04/29/2017 0745   PROT 7.2 12/31/2020 0809   PROT 7.6 04/29/2017 0745   ALBUMIN 4.1 12/31/2020 0809   ALBUMIN 4.0 04/29/2017 0745   AST 20 12/31/2020 0809   AST 19 04/29/2017 0745   ALT 22 12/31/2020 0809   ALT 19 04/29/2017 0745   ALKPHOS 63 12/31/2020 0809   ALKPHOS 81 04/29/2017 0745   BILITOT 0.4 12/31/2020 0809   BILITOT 1.31 (H) 04/29/2017 0745   GFRNONAA >60 12/31/2020 0809   GFRNONAA 83 02/20/2016 0908   GFRAA >60 11/07/2019 0740   GFRAA >89 02/20/2016 0908    No results found for: SPEP, UPEP  Lab Results  Component Value Date   WBC 7.7 12/31/2020   NEUTROABS 5.1 12/31/2020   HGB 13.8 12/31/2020   HCT 39.8 12/31/2020   MCV 98.5 12/31/2020   PLT 208 12/31/2020      Chemistry      Component Value Date/Time   NA 140 12/31/2020 0809   NA 140 04/29/2017 0745   K 4.0 12/31/2020 0809   K 3.3 (L) 04/29/2017 0745   CL 105 12/31/2020 0809   CO2 25 12/31/2020 0809   CO2 23 04/29/2017 0745   BUN 13 12/31/2020 0809   BUN 10.7 04/29/2017 0745   CREATININE 1.06 (H) 12/31/2020 0809   CREATININE 1.2 (H) 04/29/2017 0745      Component Value Date/Time   CALCIUM 10.0 12/31/2020 0809   CALCIUM 9.8 04/29/2017 0745   ALKPHOS 63 12/31/2020 0809   ALKPHOS 81 04/29/2017 0745   AST 20 12/31/2020 0809   AST 19 04/29/2017 0745   ALT 22  12/31/2020 0809   ALT 19 04/29/2017 0745   BILITOT 0.4 12/31/2020 0809   BILITOT 1.31 (H) 04/29/2017 0745

## 2020-12-31 NOTE — Assessment & Plan Note (Signed)
She has chronic musculoskeletal pain since discontinuation of treatment She will continue prescribed tramadol We discussed narcotic refill policy 

## 2021-01-13 ENCOUNTER — Other Ambulatory Visit: Payer: Self-pay | Admitting: Hematology and Oncology

## 2021-01-13 DIAGNOSIS — B20 Human immunodeficiency virus [HIV] disease: Secondary | ICD-10-CM

## 2021-01-13 MED ORDER — TRAMADOL HCL 50 MG PO TABS: 100.0000 mg | ORAL_TABLET | Freq: Four times a day (QID) | ORAL | 0 refills | Status: DC | PRN

## 2021-01-24 ENCOUNTER — Telehealth: Payer: Self-pay

## 2021-01-24 ENCOUNTER — Other Ambulatory Visit: Payer: Self-pay | Admitting: Hematology

## 2021-01-24 DIAGNOSIS — B20 Human immunodeficiency virus [HIV] disease: Secondary | ICD-10-CM

## 2021-01-24 MED ORDER — TRAMADOL HCL 50 MG PO TABS: 100.0000 mg | ORAL_TABLET | Freq: Four times a day (QID) | ORAL | 0 refills | Status: DC | PRN

## 2021-01-24 NOTE — Telephone Encounter (Signed)
She called and left a message requesting refill on Tramadol Rx.  

## 2021-01-24 NOTE — Telephone Encounter (Signed)
Called and left message prescription sent.

## 2021-02-07 ENCOUNTER — Other Ambulatory Visit: Payer: Self-pay | Admitting: Hematology and Oncology

## 2021-02-07 ENCOUNTER — Telehealth: Payer: Self-pay

## 2021-02-07 DIAGNOSIS — B20 Human immunodeficiency virus [HIV] disease: Secondary | ICD-10-CM

## 2021-02-07 MED ORDER — TRAMADOL HCL 50 MG PO TABS: 100.0000 mg | ORAL_TABLET | Freq: Four times a day (QID) | ORAL | 0 refills | Status: DC | PRN

## 2021-02-07 NOTE — Telephone Encounter (Signed)
She called and left a message requesting Tramadol refill. She ask for a call back when Rx sent.

## 2021-02-07 NOTE — Telephone Encounter (Signed)
done

## 2021-02-07 NOTE — Telephone Encounter (Signed)
Called and told her Rx sent to pharmacy. She verbalized understanding. 

## 2021-02-18 ENCOUNTER — Telehealth: Payer: Self-pay

## 2021-02-18 ENCOUNTER — Other Ambulatory Visit: Payer: Self-pay | Admitting: Hematology and Oncology

## 2021-02-18 DIAGNOSIS — B20 Human immunodeficiency virus [HIV] disease: Secondary | ICD-10-CM

## 2021-02-18 MED ORDER — TRAMADOL HCL 50 MG PO TABS: 100.0000 mg | ORAL_TABLET | Freq: Four times a day (QID) | ORAL | 0 refills | Status: DC | PRN

## 2021-02-18 NOTE — Telephone Encounter (Signed)
Pt called to request refill on Tramadol 50 mg 2 t Q6H PRN pain. Last filled 10/14. Request routed to MD, LVM for pt making her aware.

## 2021-02-18 NOTE — Telephone Encounter (Signed)
Refill sent.

## 2021-02-23 ENCOUNTER — Other Ambulatory Visit: Payer: Self-pay

## 2021-02-23 ENCOUNTER — Ambulatory Visit (HOSPITAL_COMMUNITY)
Admission: EM | Admit: 2021-02-23 | Discharge: 2021-02-23 | Disposition: A | Discharge status: Home / Self Care | Payer: 59 | Attending: Student | Admitting: Student

## 2021-02-23 ENCOUNTER — Encounter (HOSPITAL_COMMUNITY): Payer: Self-pay

## 2021-02-23 DIAGNOSIS — B20 Human immunodeficiency virus [HIV] disease: Secondary | ICD-10-CM | POA: Diagnosis present

## 2021-02-23 DIAGNOSIS — N898 Other specified noninflammatory disorders of vagina: Secondary | ICD-10-CM | POA: Insufficient documentation

## 2021-02-23 DIAGNOSIS — Z113 Encounter for screening for infections with a predominantly sexual mode of transmission: Secondary | ICD-10-CM | POA: Insufficient documentation

## 2021-02-23 DIAGNOSIS — B009 Herpesviral infection, unspecified: Secondary | ICD-10-CM | POA: Insufficient documentation

## 2021-02-23 DIAGNOSIS — Z79899 Other long term (current) drug therapy: Secondary | ICD-10-CM | POA: Insufficient documentation

## 2021-02-23 MED ORDER — PREDNISONE 20 MG PO TABS: 20.0000 mg | ORAL_TABLET | Freq: Every day | ORAL | 0 refills | Status: AC

## 2021-02-23 MED ORDER — PREDNISONE 20 MG PO TABS: 20.0000 mg | ORAL_TABLET | Freq: Every day | ORAL | 0 refills | Status: DC

## 2021-02-23 NOTE — ED Triage Notes (Signed)
Pt presents with vaginal irritation after using a cream given to her by infectious disease to help with a vaginal rash that she had X a month ago.

## 2021-02-23 NOTE — Discharge Instructions (Addendum)
-  Prednisone one pill daily x5 days. Take with food.  -Continue medications prescribed by infection disease -Follow-up with infectious disease if symptoms persist.
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
## 2021-02-24 LAB — CERVICOVAGINAL ANCILLARY ONLY
Bacterial Vaginitis (gardnerella): POSITIVE — AB
Candida Glabrata: NEGATIVE
Candida Vaginitis: NEGATIVE
Chlamydia: NEGATIVE
Comment: NEGATIVE
Comment: NEGATIVE
Comment: NEGATIVE
Comment: NEGATIVE
Comment: NEGATIVE
Comment: NORMAL
Neisseria Gonorrhea: NEGATIVE
Trichomonas: NEGATIVE

## 2021-02-24 LAB — RPR: RPR Ser Ql: NONREACTIVE

## 2021-02-25 ENCOUNTER — Telehealth (HOSPITAL_COMMUNITY): Payer: Self-pay | Admitting: Emergency Medicine

## 2021-02-25 MED ORDER — METRONIDAZOLE 500 MG PO TABS: 500.0000 mg | ORAL_TABLET | Freq: Two times a day (BID) | ORAL | 0 refills | Status: DC

## 2021-03-03 ENCOUNTER — Other Ambulatory Visit: Payer: Self-pay | Admitting: Hematology and Oncology

## 2021-03-03 ENCOUNTER — Telehealth: Payer: Self-pay

## 2021-03-03 DIAGNOSIS — B20 Human immunodeficiency virus [HIV] disease: Secondary | ICD-10-CM

## 2021-03-03 MED ORDER — TRAMADOL HCL 50 MG PO TABS: 100.0000 mg | ORAL_TABLET | Freq: Four times a day (QID) | ORAL | 0 refills | Status: DC | PRN

## 2021-03-03 NOTE — Telephone Encounter (Signed)
done

## 2021-03-03 NOTE — Telephone Encounter (Signed)
She called and requested a refill on Tramadol Rx and ask that the Rx be sent to [facility] on [address], added to pharmacy list. The [facility] closed.

## 2021-03-03 NOTE — Telephone Encounter (Signed)
Called and told Rx sent to pharmacy. She verbalized understanding. 

## 2021-03-13 ENCOUNTER — Telehealth: Payer: Self-pay

## 2021-03-13 ENCOUNTER — Other Ambulatory Visit: Payer: Self-pay | Admitting: Hematology and Oncology

## 2021-03-13 DIAGNOSIS — B20 Human immunodeficiency virus [HIV] disease: Secondary | ICD-10-CM

## 2021-03-13 MED ORDER — TRAMADOL HCL 50 MG PO TABS: 100.0000 mg | ORAL_TABLET | Freq: Four times a day (QID) | ORAL | 0 refills | Status: DC | PRN

## 2021-03-13 NOTE — Telephone Encounter (Signed)
Pt called and LVM requesting refill on Tramadol. Refill request given to Dr Lindi Adie as Dr Alvy Bimler is on [facility]. Called pt to inform request was given to MD, no answer and VM box is full. Will send Mychart message.

## 2021-03-28 ENCOUNTER — Telehealth: Payer: Self-pay

## 2021-03-28 ENCOUNTER — Other Ambulatory Visit: Payer: Self-pay | Admitting: Hematology and Oncology

## 2021-03-28 DIAGNOSIS — B20 Human immunodeficiency virus [HIV] disease: Secondary | ICD-10-CM

## 2021-03-28 MED ORDER — TRAMADOL HCL 50 MG PO TABS: 100.0000 mg | ORAL_TABLET | Freq: Four times a day (QID) | ORAL | 0 refills | Status: DC | PRN

## 2021-03-28 NOTE — Telephone Encounter (Signed)
done

## 2021-03-28 NOTE — Telephone Encounter (Signed)
Pt called and requested a refill for Tramadol 50 mg. Advised pt I would make MD aware of request.

## 2021-04-08 ENCOUNTER — Other Ambulatory Visit: Payer: Self-pay | Admitting: Hematology and Oncology

## 2021-04-08 ENCOUNTER — Telehealth: Payer: Self-pay

## 2021-04-08 DIAGNOSIS — B20 Human immunodeficiency virus [HIV] disease: Secondary | ICD-10-CM

## 2021-04-08 MED ORDER — TRAMADOL HCL 50 MG PO TABS: 100.0000 mg | ORAL_TABLET | Freq: Four times a day (QID) | ORAL | 0 refills | Status: DC | PRN

## 2021-04-08 NOTE — Telephone Encounter (Signed)
She called and left a message requesting refill on Tramadol Rx.  

## 2021-04-15 ENCOUNTER — Other Ambulatory Visit: Payer: Self-pay | Admitting: Hematology and Oncology

## 2021-04-22 ENCOUNTER — Other Ambulatory Visit: Payer: Self-pay | Admitting: Oncology

## 2021-04-22 DIAGNOSIS — B20 Human immunodeficiency virus [HIV] disease: Secondary | ICD-10-CM

## 2021-04-22 MED ORDER — TRAMADOL HCL 50 MG PO TABS: 100.0000 mg | ORAL_TABLET | Freq: Four times a day (QID) | ORAL | 0 refills | Status: DC | PRN

## 2021-04-26 ENCOUNTER — Other Ambulatory Visit: Payer: Self-pay

## 2021-04-26 ENCOUNTER — Encounter (HOSPITAL_COMMUNITY): Payer: Self-pay

## 2021-04-26 ENCOUNTER — Emergency Department (HOSPITAL_COMMUNITY)
Admission: EM | Admit: 2021-04-26 | Discharge: 2021-04-27 | Disposition: A | Discharge status: Home / Self Care | Payer: 59 | Attending: Emergency Medicine | Admitting: Emergency Medicine

## 2021-04-26 DIAGNOSIS — R112 Nausea with vomiting, unspecified: Secondary | ICD-10-CM

## 2021-04-26 DIAGNOSIS — Z79899 Other long term (current) drug therapy: Secondary | ICD-10-CM | POA: Insufficient documentation

## 2021-04-26 DIAGNOSIS — U071 COVID-19: Secondary | ICD-10-CM | POA: Diagnosis not present

## 2021-04-26 NOTE — ED Triage Notes (Signed)
Pt arrives EMS from home with c/o nausea, vomiting (6 episodes), chills and nonproductive cough beginning this morning.

## 2021-04-27 ENCOUNTER — Telehealth (HOSPITAL_COMMUNITY): Payer: Self-pay | Admitting: Emergency Medicine

## 2021-04-27 DIAGNOSIS — U071 COVID-19: Secondary | ICD-10-CM | POA: Diagnosis not present

## 2021-04-27 LAB — CBC WITH DIFFERENTIAL/PLATELET
Abs Immature Granulocytes: 0.02 10*3/uL (ref 0.00–0.07)
Basophils Absolute: 0 10*3/uL (ref 0.0–0.1)
Basophils Relative: 1 %
Eosinophils Absolute: 0 10*3/uL (ref 0.0–0.5)
Eosinophils Relative: 1 %
HCT: 39.9 % (ref 36.0–46.0)
Hemoglobin: 14.1 g/dL (ref 12.0–15.0)
Immature Granulocytes: 0 %
Lymphocytes Relative: 12 %
Lymphs Abs: 0.8 10*3/uL (ref 0.7–4.0)
MCH: 35.3 pg — ABNORMAL HIGH (ref 26.0–34.0)
MCHC: 35.3 g/dL (ref 30.0–36.0)
MCV: 100 fL (ref 80.0–100.0)
Monocytes Absolute: 1.2 10*3/uL — ABNORMAL HIGH (ref 0.1–1.0)
Monocytes Relative: 19 %
Neutro Abs: 4.2 10*3/uL (ref 1.7–7.7)
Neutrophils Relative %: 67 %
Platelets: 193 10*3/uL (ref 150–400)
RBC: 3.99 MIL/uL (ref 3.87–5.11)
RDW: 12.6 % (ref 11.5–15.5)
WBC: 6.2 10*3/uL (ref 4.0–10.5)
nRBC: 0 % (ref 0.0–0.2)

## 2021-04-27 LAB — COMPREHENSIVE METABOLIC PANEL
ALT: 19 U/L (ref 0–44)
AST: 21 U/L (ref 15–41)
Albumin: 4.3 g/dL (ref 3.5–5.0)
Alkaline Phosphatase: 55 U/L (ref 38–126)
Anion gap: 7 (ref 5–15)
BUN: 10 mg/dL (ref 6–20)
CO2: 23 mmol/L (ref 22–32)
Calcium: 8.9 mg/dL (ref 8.9–10.3)
Chloride: 105 mmol/L (ref 98–111)
Creatinine, Ser: 1.15 mg/dL — ABNORMAL HIGH (ref 0.44–1.00)
GFR, Estimated: >60 mL/min (ref >60)
Glucose, Bld: 86 mg/dL (ref 70–99)
Potassium: 3.4 mmol/L — ABNORMAL LOW (ref 3.5–5.1)
Sodium: 135 mmol/L (ref 135–145)
Total Bilirubin: 0.4 mg/dL (ref 0.3–1.2)
Total Protein: 7.5 g/dL (ref 6.5–8.1)

## 2021-04-27 LAB — HCG, SERUM, QUALITATIVE: Preg, Serum: NEGATIVE

## 2021-04-27 LAB — RESP PANEL BY RT-PCR (FLU A&B, COVID) ARPGX2
Influenza A by PCR: NEGATIVE
Influenza B by PCR: NEGATIVE
SARS Coronavirus 2 by RT PCR: POSITIVE — AB

## 2021-04-27 LAB — LIPASE, BLOOD: Lipase: 22 U/L (ref 11–51)

## 2021-04-27 MED ORDER — ONDANSETRON 8 MG PO TBDP
8.0000 mg | ORAL_TABLET | Freq: Once | ORAL | Status: AC
  Administered 2021-04-27: 8 mg via ORAL
  Filled 2021-04-27: qty 1

## 2021-04-27 MED ORDER — MOLNUPIRAVIR EUA 200MG CAPSULE: 4.0000 | ORAL_CAPSULE | Freq: Two times a day (BID) | ORAL | 0 refills | Status: AC

## 2021-04-27 MED ORDER — ACETAMINOPHEN 325 MG PO TABS
650.0000 mg | ORAL_TABLET | Freq: Once | ORAL | Status: AC
Start: 2021-04-27 — End: 2021-04-27
  Administered 2021-04-27: 650 mg via ORAL
  Filled 2021-04-27: qty 2

## 2021-04-27 MED ORDER — ONDANSETRON HCL 4 MG PO TABS: 4.0000 mg | ORAL_TABLET | Freq: Four times a day (QID) | ORAL | 0 refills | Status: DC

## 2021-04-27 MED ORDER — BENZONATATE 100 MG PO CAPS: 100.0000 mg | ORAL_CAPSULE | Freq: Three times a day (TID) | ORAL | 0 refills | Status: AC

## 2021-04-27 NOTE — ED Notes (Signed)
Pt tolerated drink fine

## 2021-04-27 NOTE — Discharge Instructions (Addendum)
Your covid test was positive. You should be isolated for at least 5 days since the onset of your symptoms AND >72 hours after symptoms resolution (absence of fever without the use of fever reducing medicaiton and improvement in respiratory symptoms), whichever is longer  You were given a prescription for zofran to help with your nausea. Please take as directed.  Take tessalon for cough. Stay well hydrated. Rotate tylenol and motrin for fevers  You were also given a prescription for molnupirovir to treat your COVID. This is an antiviral medication. You should not take this if you are pregnant. You should use contraception correctly and consistently  during therapy and for 4 days after the last dose of molnupiravir.  Please follow up with your primary care provider within 5-7 days for re-evaluation of your symptoms. If you do not have a primary care provider, information for a healthcare clinic has been provided for you to make arrangements for follow up care. Please return to the emergency department for any new or worsening symptoms.

## 2021-04-27 NOTE — ED Notes (Signed)
Pt was given a drink per PA, to see if pt could tolerate

## 2021-04-27 NOTE — ED Provider Notes (Signed)
[facility] DEPT Provider Note   CSN: [id number] Arrival date & time: 04/26/21  2316     History  Chief Complaint  Patient presents with   Vomiting    [name] is a 36 y.o. female.  HPI  36 year old female with a history of HIV, Hodgkin's lymphoma, anemia, who presents emergency department today for evaluation of fevers chills nausea vomiting and COVID.  This started over the last 24 hours.  She denies any rhinorrhea, congestion, shortness of breath.  She reports that she was recently exposed to [place] by someone at work.  She denies any other known sick contacts.  She has been able to tolerate p.o. in the waiting room.  Home Medications Prior to Admission medications   Medication Sig Start Date End Date Taking? Authorizing Provider  benzonatate (TESSALON) 100 MG capsule Take 1 capsule (100 mg total) by mouth every 8 (eight) hours for 5 days. 04/27/21 05/02/21 Yes Abanoub Hanken S, PA-C  molnupiravir EUA (LAGEVRIO) 200 mg CAPS capsule Take 4 capsules (800 mg total) by mouth 2 (two) times daily for 5 days. 04/27/21 05/02/21 Yes Rafay Dahan S, PA-C  ondansetron (ZOFRAN) 4 MG tablet Take 1 tablet (4 mg total) by mouth every 6 (six) hours. 04/27/21  Yes Alithia Zavaleta S, PA-C  bictegravir-emtricitabine-tenofovir AF (BIKTARVY) 50-200-25 MG TABS tablet Take 1 tablet by mouth daily. 11/19/20   Thayer Headings, MD  cholecalciferol (VITAMIN D) 1000 units tablet Take 1 tablet (1,000 Units total) by mouth daily. 06/05/16   Heath Lark, MD  emtricitabine-tenofovir (TRUVADA) 200-300 MG tablet Take 1 tablet by mouth daily. 12/04/20   [provider]  hydrOXYzine (ATARAX/VISTARIL) 10 MG tablet Take 1 tablet (10 mg total) by mouth 3 (three) times daily as needed. 10/01/20   Heath Lark, MD  imiquimod (ALDARA) 5 % cream Apply topically 3 (three) times a week. 12/20/20   Harris Callas, NP  metaxalone (SKELAXIN) 800 MG tablet Take 0.5 tablets (400 mg total) by  mouth 3 (three) times daily. Start by taking 1/2 a pill. Don't drive after taking this medication, as it could make you sleepy. You can increase to 1 pill if you tolerate 1/2 pill well and need additional relief. 04/14/20   Hazel Sams, PA-C  metroNIDAZOLE (FLAGYL) 500 MG tablet Take 1 tablet (500 mg total) by mouth 2 (two) times daily. 02/25/21   Lamptey, Myrene Galas, MD  traMADol (ULTRAM) 50 MG tablet Take 2 tablets (100 mg total) by mouth every 6 (six) hours as needed. 04/22/21   Wyatt Portela, MD      Allergies    Temazepam    Review of Systems   Review of Systems  Constitutional:  Positive for chills, diaphoresis and fever.  HENT:  Negative for ear pain and sore throat.   Eyes:  Negative for pain and visual disturbance.  Respiratory:  Positive for cough. Negative for shortness of breath.   Gastrointestinal:  Positive for nausea and vomiting. Negative for abdominal pain, constipation and diarrhea.  Genitourinary:  Negative for dysuria and hematuria.  Musculoskeletal:  Negative for back pain and myalgias.  Skin:  Negative for rash.  Neurological:  Negative for syncope.  All other systems reviewed and are negative.  Physical Exam Updated Vital Signs BP 104/74    Pulse 84    Temp 99 F (37.2 C)    Resp 17    Ht 5\' 4"  (1.626 m)    Wt 49.9 kg    SpO2  100%    BMI 18.88 kg/m  Physical Exam Vitals and nursing note reviewed.  Constitutional:      General: She is not in acute distress.    Appearance: She is well-developed.  HENT:     Head: Normocephalic and atraumatic.  Eyes:     Conjunctiva/sclera: Conjunctivae normal.  Cardiovascular:     Rate and Rhythm: Normal rate and regular rhythm.     Heart sounds: Normal heart sounds. No murmur heard. Pulmonary:     Effort: Pulmonary effort is normal. No respiratory distress.     Breath sounds: Normal breath sounds. No wheezing, rhonchi or rales.  Abdominal:     General: Bowel sounds are normal.     Palpations: Abdomen is soft.      Tenderness: There is no abdominal tenderness.  Musculoskeletal:        General: No swelling.     Cervical back: Neck supple.  Skin:    General: Skin is warm and dry.     Capillary Refill: Capillary refill takes less than 2 seconds.  Neurological:     Mental Status: She is alert.  Psychiatric:        Mood and Affect: Mood normal.    ED Results / Procedures / Treatments   Labs (all labs ordered are listed, but only abnormal results are displayed) Labs Reviewed  RESP PANEL BY RT-PCR (FLU A&B, COVID) ARPGX2 - Abnormal; Notable for the following components:      Result Value   SARS Coronavirus 2 by RT PCR POSITIVE (*)    All other components within normal limits  CBC WITH DIFFERENTIAL/PLATELET - Abnormal; Notable for the following components:   MCH 35.3 (*)    Monocytes Absolute 1.2 (*)    All other components within normal limits  COMPREHENSIVE METABOLIC PANEL - Abnormal; Notable for the following components:   Potassium 3.4 (*)    Creatinine, Ser 1.15 (*)    All other components within normal limits  LIPASE, BLOOD  HCG, SERUM, QUALITATIVE    EKG None  Radiology No results found.  Procedures Procedures    Medications Ordered in ED Medications  acetaminophen (TYLENOL) tablet 650 mg (has no administration in time range)  ondansetron (ZOFRAN-ODT) disintegrating tablet 8 mg (has no administration in time range)    ED Course/ Medical Decision Making/ A&P                           Medical Decision Making  Patient presenting for evaluation for nv and cough.  Reports symptoms ongoing for 1 day.  Patient nontoxic, well-appearing, no distress.  Vital signs are reassuring. Tested for Covid in the ED. Results positive. Labs reviewed and show no leukocytosis, no significant electrolyte derangement, mildly elevated creatinine that has not significantly changed from prior, normal LFTs, normal lipase, negative pregnancy test.  Abdomen soft and nontender.  Doubt emergent  intra-abdominal/pelvic cause of symptoms.  Suspect her nausea and vomiting is related to her COVID.  She is able to tolerate p.o. here in the ED.  She is given Tylenol and Zofran as well.  She will be given a prescription for mono.  We are, Tessalon and Zofran.  Advised on quarantine measures. Will give Rx for symptomatic management. Advised on f/u and return precautions. Pt voiced understanding of the plan and reasons to return. All questions answered, pt stable for d/c.   Final Clinical Impression(s) / ED Diagnoses Final diagnoses:  COVID  Nausea and vomiting,  unspecified vomiting type    Rx / DC Orders ED Discharge Orders          Ordered    ondansetron (ZOFRAN) 4 MG tablet  Every 6 hours        04/27/21 0252    benzonatate (TESSALON) 100 MG capsule  Every 8 hours        04/27/21 0252    molnupiravir EUA (LAGEVRIO) 200 mg CAPS capsule  2 times daily        04/27/21 0252              Rodney Booze, PA-C 04/27/21 0257    Ripley Fraise, MD 04/27/21 0710

## 2021-05-04 ENCOUNTER — Ambulatory Visit
Admission: EM | Admit: 2021-05-04 | Discharge: 2021-05-04 | Disposition: A | Discharge status: Home / Self Care | Payer: 59

## 2021-05-04 ENCOUNTER — Other Ambulatory Visit: Payer: Self-pay

## 2021-05-04 DIAGNOSIS — U071 COVID-19: Secondary | ICD-10-CM | POA: Diagnosis not present

## 2021-05-04 NOTE — ED Triage Notes (Signed)
Pt reports that she was dx with covid on 04/26/21. She presents today c/o continued cough, HA, body aches and emesis. Pt feels like she is not getting better with tessalon and anti viral meds. HA bothers her the most. Coughing spells and night sweats are interrupting her sleep. Pt also c/o intermittent abdominal pain which she attributes to previous cancer dx.
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
## 2021-05-05 ENCOUNTER — Other Ambulatory Visit: Payer: Self-pay | Admitting: Hematology and Oncology

## 2021-05-05 ENCOUNTER — Encounter: Payer: Self-pay | Admitting: Hematology and Oncology

## 2021-05-05 DIAGNOSIS — B20 Human immunodeficiency virus [HIV] disease: Secondary | ICD-10-CM

## 2021-05-05 MED ORDER — TRAMADOL HCL 50 MG PO TABS: 100.0000 mg | ORAL_TABLET | Freq: Four times a day (QID) | ORAL | 0 refills | Status: DC | PRN

## 2021-05-05 NOTE — Telephone Encounter (Signed)
Pt called requesting refill on Tramadol 50 mg. Advised pt I would make MD aware, she verbalized thanks and understanding.

## 2021-05-05 NOTE — Telephone Encounter (Signed)
done

## 2021-05-19 ENCOUNTER — Other Ambulatory Visit: Payer: Self-pay | Admitting: Hematology and Oncology

## 2021-05-19 ENCOUNTER — Telehealth: Payer: Self-pay

## 2021-05-19 DIAGNOSIS — B20 Human immunodeficiency virus [HIV] disease: Secondary | ICD-10-CM

## 2021-05-19 MED ORDER — TRAMADOL HCL 50 MG PO TABS: 100.0000 mg | ORAL_TABLET | Freq: Four times a day (QID) | ORAL | 0 refills | Status: DC | PRN

## 2021-05-19 NOTE — Telephone Encounter (Signed)
Called and given message Rx sent. She verbalized understanding. 

## 2021-05-19 NOTE — Telephone Encounter (Signed)
She called and left a message requesting a Tramadol refill. She ask that you send to [facility].

## 2021-05-19 NOTE — Telephone Encounter (Signed)
done

## 2021-05-23 ENCOUNTER — Ambulatory Visit (INDEPENDENT_AMBULATORY_CARE_PROVIDER_SITE_OTHER): Payer: 59 | Admitting: Internal Medicine

## 2021-05-23 ENCOUNTER — Encounter: Payer: Self-pay | Admitting: Internal Medicine

## 2021-05-23 ENCOUNTER — Other Ambulatory Visit: Payer: Self-pay

## 2021-05-23 VITALS — BP 108/72 | HR 64 | Temp 98.0°F | Wt 111.0 lb

## 2021-05-23 DIAGNOSIS — R634 Abnormal weight loss: Secondary | ICD-10-CM | POA: Diagnosis not present

## 2021-05-23 DIAGNOSIS — Z23 Encounter for immunization: Secondary | ICD-10-CM | POA: Diagnosis not present

## 2021-05-23 DIAGNOSIS — B20 Human immunodeficiency virus [HIV] disease: Secondary | ICD-10-CM | POA: Diagnosis not present

## 2021-05-23 MED ORDER — MEGESTROL ACETATE 625 MG/5ML PO SUSP: 625.0000 mg | Freq: Every day | ORAL | 5 refills | Status: DC

## 2021-05-23 NOTE — Assessment & Plan Note (Signed)
She continues to do well on Biktarvy and no new concerns.  Labs today and she will continue with Biktarvy, no changes indicated.

## 2021-05-23 NOTE — Progress Notes (Signed)
° °  Subjective:    Patient ID: [name], female    DOB: 18-Jan-1986, 36 y.o.   MRN: [id number]  HPI Here for follow up of HIV She continues on Biktarvy with no missed doses.  No labs prior to the visit.  She recently recovered from COVID-19, had an ED visit on 12/31, lost some weight but gained it back.  Has noted two areas on her chin that hurt, pustular, now resolving.  Asking about gaining weight.    Review of Systems  Constitutional:  Negative for chills, fatigue and fever.  Gastrointestinal:  Negative for diarrhea and nausea.  Skin:  Positive for rash.       Rash on chin, has had it before      Objective:   Physical Exam Eyes:     General: No scleral icterus. Pulmonary:     Effort: Pulmonary effort is normal.  Neurological:     General: No focal deficit present.     Mental Status: She is alert.  Psychiatric:        Mood and Affect: Mood normal.          Assessment & Plan:

## 2021-05-23 NOTE — Assessment & Plan Note (Signed)
She continues to be low weight and I discussed megace with her today and will start this, twice a day.  She can back off to once a day if needed.

## 2021-05-23 NOTE — Assessment & Plan Note (Signed)
I discussed the flu shot and she refused.  Counseled.

## 2021-05-26 LAB — HIV-1 RNA QUANT-NO REFLEX-BLD
HIV 1 RNA Quant: NOT DETECTED Copies/mL
HIV-1 RNA Quant, Log: NOT DETECTED Log cps/mL

## 2021-05-26 LAB — T-HELPER CELLS (CD4) COUNT (NOT AT ARMC)
Absolute CD4: 756 cells/uL (ref 490–1740)
CD4 T Helper %: 44 % (ref 30–61)
Total lymphocyte count: 1706 cells/uL (ref 850–3900)

## 2021-06-02 ENCOUNTER — Other Ambulatory Visit: Payer: Self-pay | Admitting: Hematology and Oncology

## 2021-06-02 ENCOUNTER — Telehealth: Payer: Self-pay

## 2021-06-02 DIAGNOSIS — B20 Human immunodeficiency virus [HIV] disease: Secondary | ICD-10-CM

## 2021-06-02 MED ORDER — TRAMADOL HCL 50 MG PO TABS: 100.0000 mg | ORAL_TABLET | Freq: Four times a day (QID) | ORAL | 0 refills | Status: DC | PRN

## 2021-06-02 NOTE — Telephone Encounter (Signed)
Called and given below message Rx sent. She verbalized understanding.

## 2021-06-02 NOTE — Telephone Encounter (Signed)
She called and left a message requesting Tramadol refill. 

## 2021-06-02 NOTE — Telephone Encounter (Signed)
done

## 2021-06-08 ENCOUNTER — Other Ambulatory Visit: Payer: Self-pay | Admitting: Hematology and Oncology

## 2021-06-09 ENCOUNTER — Telehealth: Payer: Self-pay

## 2021-06-09 ENCOUNTER — Encounter: Payer: Self-pay | Admitting: Hematology and Oncology

## 2021-06-09 NOTE — Telephone Encounter (Signed)
I declined the refill because that could lead to dependency I recommend she takes OTC benadryl and claritin only

## 2021-06-09 NOTE — Telephone Encounter (Signed)
Called and given below message. She verbalized understanding and will try the OTC options.

## 2021-06-09 NOTE — Telephone Encounter (Signed)
She called and left a message requesting refill on hydroxyzine Rx. She takes it for itching.  Okay to send Rx?

## 2021-06-13 ENCOUNTER — Telehealth: Payer: Self-pay

## 2021-06-13 ENCOUNTER — Other Ambulatory Visit: Payer: Self-pay | Admitting: Hematology and Oncology

## 2021-06-13 DIAGNOSIS — B20 Human immunodeficiency virus [HIV] disease: Secondary | ICD-10-CM

## 2021-06-13 MED ORDER — TRAMADOL HCL 50 MG PO TABS: 100.0000 mg | ORAL_TABLET | Freq: Four times a day (QID) | ORAL | 0 refills | Status: DC | PRN

## 2021-06-13 NOTE — Telephone Encounter (Signed)
She called and requested Tramadol refill.

## 2021-06-13 NOTE — Telephone Encounter (Signed)
done

## 2021-06-16 ENCOUNTER — Encounter: Payer: Self-pay | Admitting: Hematology and Oncology

## 2021-06-24 ENCOUNTER — Other Ambulatory Visit: Payer: Self-pay | Admitting: Hematology and Oncology

## 2021-06-24 ENCOUNTER — Telehealth: Payer: Self-pay

## 2021-06-24 DIAGNOSIS — B20 Human immunodeficiency virus [HIV] disease: Secondary | ICD-10-CM

## 2021-06-24 MED ORDER — TRAMADOL HCL 50 MG PO TABS: 100.0000 mg | ORAL_TABLET | Freq: Four times a day (QID) | ORAL | 0 refills | Status: DC | PRN

## 2021-06-24 NOTE — Telephone Encounter (Signed)
She called and left a message requesting refill of Tramadol Rx.

## 2021-06-24 NOTE — Telephone Encounter (Signed)
done

## 2021-07-03 ENCOUNTER — Telehealth: Payer: Self-pay

## 2021-07-03 ENCOUNTER — Encounter: Payer: Self-pay | Admitting: Hematology and Oncology

## 2021-07-03 ENCOUNTER — Other Ambulatory Visit: Payer: Self-pay

## 2021-07-03 ENCOUNTER — Inpatient Hospital Stay: Payer: 59 | Attending: Hematology and Oncology

## 2021-07-03 ENCOUNTER — Inpatient Hospital Stay (HOSPITAL_BASED_OUTPATIENT_CLINIC_OR_DEPARTMENT_OTHER): Payer: 59 | Admitting: Hematology and Oncology

## 2021-07-03 DIAGNOSIS — R630 Anorexia: Secondary | ICD-10-CM | POA: Insufficient documentation

## 2021-07-03 DIAGNOSIS — M1990 Unspecified osteoarthritis, unspecified site: Secondary | ICD-10-CM | POA: Diagnosis not present

## 2021-07-03 DIAGNOSIS — F17210 Nicotine dependence, cigarettes, uncomplicated: Secondary | ICD-10-CM

## 2021-07-03 DIAGNOSIS — C8118 Nodular sclerosis classical Hodgkin lymphoma, lymph nodes of multiple sites: Secondary | ICD-10-CM

## 2021-07-03 DIAGNOSIS — M1380 Other specified arthritis, unspecified site: Secondary | ICD-10-CM | POA: Insufficient documentation

## 2021-07-03 LAB — CBC WITH DIFFERENTIAL/PLATELET
Abs Immature Granulocytes: 0.03 10*3/uL (ref 0.00–0.07)
Basophils Absolute: 0 10*3/uL (ref 0.0–0.1)
Basophils Relative: 1 %
Eosinophils Absolute: 0.1 10*3/uL (ref 0.0–0.5)
Eosinophils Relative: 1 %
HCT: 37.9 % (ref 36.0–46.0)
Hemoglobin: 13.8 g/dL (ref 12.0–15.0)
Immature Granulocytes: 0 %
Lymphocytes Relative: 35 %
Lymphs Abs: 2.7 10*3/uL (ref 0.7–4.0)
MCH: 35.6 pg — ABNORMAL HIGH (ref 26.0–34.0)
MCHC: 36.4 g/dL — ABNORMAL HIGH (ref 30.0–36.0)
MCV: 97.7 fL (ref 80.0–100.0)
Monocytes Absolute: 0.6 10*3/uL (ref 0.1–1.0)
Monocytes Relative: 8 %
Neutro Abs: 4.4 10*3/uL (ref 1.7–7.7)
Neutrophils Relative %: 55 %
Platelets: 291 10*3/uL (ref 150–400)
RBC: 3.88 MIL/uL (ref 3.87–5.11)
RDW: 13.6 % (ref 11.5–15.5)
WBC: 7.8 10*3/uL (ref 4.0–10.5)
nRBC: 0 % (ref 0.0–0.2)

## 2021-07-03 LAB — COMPREHENSIVE METABOLIC PANEL
ALT: 17 U/L (ref 0–44)
AST: 15 U/L (ref 15–41)
Albumin: 4.3 g/dL (ref 3.5–5.0)
Alkaline Phosphatase: 56 U/L (ref 38–126)
Anion gap: 5 (ref 5–15)
BUN: 10 mg/dL (ref 6–20)
CO2: 26 mmol/L (ref 22–32)
Calcium: 9.8 mg/dL (ref 8.9–10.3)
Chloride: 109 mmol/L (ref 98–111)
Creatinine, Ser: 1.01 mg/dL — ABNORMAL HIGH (ref 0.44–1.00)
GFR, Estimated: >60 mL/min (ref >60)
Glucose, Bld: 94 mg/dL (ref 70–99)
Potassium: 3.4 mmol/L — ABNORMAL LOW (ref 3.5–5.1)
Sodium: 140 mmol/L (ref 135–145)
Total Bilirubin: 0.3 mg/dL (ref 0.3–1.2)
Total Protein: 7 g/dL (ref 6.5–8.1)

## 2021-07-03 NOTE — Assessment & Plan Note (Signed)
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

## 2021-07-03 NOTE — Assessment & Plan Note (Signed)
We discussed the importance of nicotine cessation  

## 2021-07-03 NOTE — Progress Notes (Signed)
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[place]) She has no clinical signs or symptoms of recurrence Her last imaging study was almost 2 years ago In the absence of symptoms, I do not feel strongly we need to order serial surveillance imaging studies I plan to see her again in 4 months for further follow-up  Inflammatory arthritis She has chronic musculoskeletal joint pain and is dependent on tramadol We discussed narcotic refill policy  Cigarette smoker We discussed the importance of nicotine cessation  Anorexia She was prescribed Megace to stimulate appetite and weight gain I suspect this is the cause of her amenorrhea  No orders of the defined types were placed in this encounter.   All questions were answered. The patient knows to call the clinic with any problems, questions or concerns. The total time spent in the appointment was 20 minutes encounter with patients including review of chart and various tests results, discussions about plan of care and coordination of care plan   Heath Lark, MD 07/03/2021 9:16 AM  INTERVAL HISTORY: Please see below for problem oriented charting. 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 We have discontinued treatment almost 2 years ago She is doing well and working She was started on Megace by her infectious disease physician due to anorexia She has noted amenorrhea since December of last year Unfortunately, she continues to smoke cigarettes and cigar, unable to quit  REVIEW OF SYSTEMS:    Constitutional: Denies fevers, chills or abnormal weight loss Eyes: Denies blurriness of vision Ears, nose, mouth, throat, and face: Denies mucositis or sore throat Respiratory: Denies cough, dyspnea or wheezes Cardiovascular: Denies palpitation, chest discomfort or lower extremity swelling Gastrointestinal:  Denies nausea, heartburn or change in bowel habits Skin: Denies abnormal skin rashes Lymphatics: Denies new lymphadenopathy or easy bruising Neurological:Denies numbness, tingling or new weaknesses Behavioral/Psych: Mood is stable, no new changes  All other systems were reviewed with the patient and are negative.  I have reviewed the past medical history, past surgical history, social history and family history with the patient and they are unchanged from previous note.  ALLERGIES:  is allergic to temazepam.  MEDICATIONS:  Current Outpatient Medications  Medication Sig Dispense Refill   bictegravir-emtricitabine-tenofovir AF (BIKTARVY) 50-200-25 MG TABS tablet Take 1 tablet by mouth daily. 30 tablet 11   cholecalciferol (VITAMIN D) 1000 units tablet Take 1 tablet (1,000 Units total) by mouth daily. 30 tablet 9   hydrOXYzine (ATARAX/VISTARIL) 10 MG tablet Take 1 tablet (10 mg total) by mouth 3 (three) times daily as needed. 90 tablet 0   megestrol (MEGACE ES) 625 MG/5ML suspension Take 5 mLs (625 mg total) by mouth daily. 150 mL 5   ondansetron (ZOFRAN) 4 MG tablet Take 1 tablet (4 mg total) by mouth every 6 (six) hours. 12 tablet 0   traMADol (ULTRAM) 50 MG tablet Take 2 tablets (100 mg total) by mouth every 6 (six) hours as needed. 90 tablet 0   No current facility-administered medications for this visit.    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   05/07/2014 Imaging   CT scan of the chest show right thoracic inlet lymphadenopathy    06/07/2014 Procedure   She underwent ultrasound-guided core biopsy of the neck lymph node   06/07/2014 Pathology Results   Accession: [id number] biopsy confirmed diagnosis of Hodgkin lymphoma.   06/15/2014 Imaging   Echocardiogram showed preserved ejection fraction   07/09/2014 - 07/12/2014 Hospital Admission   She was admitted to the hospital for severe anemia.   07/27/2014 Procedure   She had placement of port   07/31/2014 - 09/11/2014 Chemotherapy   She received dose adjusted chemotherapy due to abnormal liver function tests and severe anemia. Treatment was delayed due to noncompliance  and subsequently stopped because the patient failed to keep appointments   01/11/2015 Imaging   Repeat PET CT scan showed response to treatment   01/28/2015 - 06/18/2015 Chemotherapy   ABVD was restarted with full dose.   02/08/2015 - 02/10/2015 Hospital Admission   The patient was admitted to the hospital due to pancytopenia and profuse diarrhea. Cultures were negative. She was placed on ciprofloxacin.   02/11/2015 Adverse Reaction   Treatment was placed on hold due to recent infection.   04/11/2015 Imaging   PET CT scan showed near complete response. Incidental finding of an abnormal bone lesion, indeterminate. She is not symptomatic. Recommendation from [facility] to observe   07/11/2015 Imaging   PET CT scan showed abnormal new bone lesions, suggestive of possible disease progression   07/23/2015 Bone Marrow Biopsy   She underwent bone biopsy   07/23/2015 Pathology Results   Accession: [id number]  biopsy was negative for cancer   11/21/2015 Surgery   She had surgery for ectopic pregnancy   01/29/2016 Imaging   Ct chest, abdomen and pelvis showed pelvic and retroperitoneal lymphadenopathy, as above, concerning for residual disease. There is also a mildly enlarged posterior mediastinal lymph node measuring 11 mm adjacent to the distal descending thoracic aorta. This may represent an additional focus of  disease, but is the only finding of concern in the thorax on today's examination. Sclerosis in the right ilium at site of previously noted metabolically active lesion, grossly unchanged. No other definite osseous lesions are identified on today's examination. Spleen is normal in size and appearance.   02/14/2016 PET scan   Interval disease worsening with new foci of hypermetabolic activity in multiple retroperitoneal and pelvic lymph nodes as well as AP window and left hilar lymph nodes. (Deauville 5). There is also overall worsening of the osseous disease.   05/11/2016 Pathology Results   Diagnosis Lymph node, needle/core biopsy, Left para-aortic retroperitoneal - CLASSICAL HODGKIN LYMPHOMA. - SEE ONCOLOGY TABLE. Microscopic Comment LYMPHOMA Histologic type: Classical Hodgkin lymphoma. Grade (if applicable): N/A Flow cytometry: Not done. Immunohistochemical stains: CD15, CD20, CD3, LCA, PAX-5, CD30 with appropriate controls. Touch preps/imprints: Not performed. Comments: The sections show small needle core biopsy fragments displaying a polymorphous cellular proliferation of small lymphocytes, plasma cells, eosinophils, and large atypical mononuclear and multilobated lymphoid cells with features of Reed-Sternberg cells and variants. This is associated with patchy areas of fibrosis. Immunohistochemical stains were performed and show that the large atypical lymphoid cells are positive for CD30, CD15 and PAX-5 and negative for LCA, CD20, CD3. The small lymphoid cells in the background show a mixture of T and B cells with predominance of T cells. The overall morphologic and histologic features are consistent with classical Hodgkin lymphoma. Further subtyping is challenging in limited small  biopsy fragments but the patchy fibrosis suggests nodular sclerosis type.    05/11/2016 Procedure   She underwent CT guided biopsy of retroperitoneal lymph node   05/26/2016 Procedure   Successful placement of a  right IJ approach Power Port with ultrasound and fluoroscopic guidance. The catheter is ready for use.   06/02/2016 PET scan   Mixed response to chemotherapy with some lymph nodes decreased in metabolic activity and some lymph nodes increase metabolic activity. Lymph node stations including mediastinum, periaortic retroperitoneum, and obturator node stations. Activity is remains relatively intense Deauville 4 2. LEFT infrahilar nodule / lymph node with intense metabolic activity decreased from prior. ( Deauville 4 ). 3. 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 Deauville 5   06/03/2016 - 06/05/2016 Hospital Admission   She was admitted to the hospital for cycle 1 of ICE chemotherapy   06/24/2016 - 06/26/2016 Hospital Admission   She received cycle 2 of ICE chemo   07/07/2016 PET scan   Resolution of prior hypermetabolic adenopathy and resolution of prior osseous foci of hypermetabolic activity compatible with essentially complete response to therapy (Deauville 1). 2. Generalized reduced activity in the L4 vertebral body and the type of finding which would typically reflect prior radiation therapy. 3. Stable septated fatty right pelvic lesion, possibly a dermoid or lipoma, not hypermetabolic   2/42/3536 PET scan   Hypermetabolic lesion along the L3 vertebral body and left posterior elements, max SUV 7.8 (Deauville 5). Hypermetabolic lesion along the left inferior pubic ramus, max SUV 4.9 (Deauville 4).  IMPRESSION: Prevascular lymphadenopathy, reflecting nodal recurrence (Deauville 4). Hypermetabolic osseous metastases involving the L3 vertebral body/posterior elements and left inferior pubic ramus (Deauville 4-5). Hypermetabolism along the endometrium, new, possibly reactive/physiologic. Consider pelvic ultrasound and/or endometrial sampling as clinically warranted.   11/04/2016 - 02/24/2017 Chemotherapy   She received Brentuximab   12/11/2016 PET scan   1. Mixed response  to therapy within the skeleton. Lesions at L3 is decreased in size and metabolic activity. Residual activity remains above liver ( Deauville 4) 2. Increased activity in the LEFT sacrum with metabolic activity above liver activity ( Deauville 4 3. Decrease in size and metabolic activity of anterior mediastinal tissue consistent with resolution of thymic hyperplasia or resolution of lymphoma metabolic activity ( Deauville 2). 4. No new lymphadenopathy.  Normal spleen and liver.   03/25/2017 PET scan   1. Increased metabolic activity in several normal sized retroperitoneal and right pelvic lymph nodes, Deauville 4. 2. Increase in size and metabolic activity of lesions in the L3 and L4 vertebral bodies and in the left sacrum, Deauville 5. 3. No recurrence of anterior mediastinal abnormal activity. 4. Other imaging findings of potential clinical significance: Chronic ethmoid and left maxillary sinusitis. Paraseptal emphysema at the lung apices. Right ovarian dermoid.   04/06/2017 -  Chemotherapy   The patient had Keytruda   04/06/2017 - 05/23/2019 Chemotherapy   The patient had pembrolizumab (KEYTRUDA) 200 mg in sodium chloride 0.9 % 50 mL chemo infusion, 200 mg, Intravenous, Once, 30 of 31 cycles Administration: 200 mg (04/06/2017), 200 mg (04/29/2017), 200 mg (05/20/2017), 200 mg (06/10/2017), 200 mg (07/01/2017), 200 mg (07/22/2017), 200 mg (08/12/2017), 200 mg (09/02/2017), 200 mg (10/04/2017), 200 mg (11/03/2017), 200 mg (12/02/2017), 200 mg (12/30/2017), 200 mg (01/27/2018), 200 mg (02/24/2018), 200 mg (03/22/2018), 200 mg (04/19/2018), 200 mg (05/17/2018), 200 mg (06/14/2018), 200 mg (07/12/2018), 200 mg (08/09/2018), 200 mg (09/06/2018), 200 mg (10/04/2018), 200 mg (11/01/2018), 200 mg (11/29/2018), 200 mg (12/27/2018), 200  mg (01/24/2019), 200 mg (03/14/2019), 200 mg (05/23/2019)   for chemotherapy treatment.     06/09/2017 PET scan   1. Overall significant improvement with reduction in nodal activity. Several  retroperitoneal nodes which were previously Deauville 4 are currently Deauville 3. The right common iliac lymph node remains at Deauville 4 with maximum SUV of 3.4, but is improved from prior SUV of 4.3. 2. The bony lesions show the greatest improvement, with the previously Deauville 5 hypermetabolic lesions currently no longer of higher metabolic activity than surrounding bone marrow, currently measuring at Deauville 3. 3. 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 4. No new lesions are identified. 5.  Emphysema (ICD10-J43.9).   08/05/2017 Imaging   1. Somewhat complex left adnexal lesion, potentially hemorrhagic cyst measuring 2.9 x 2.8 cm. The possibility of torsion in the left ovary must be considered. Advise correlation with pelvic ultrasound including Doppler assessment to further evaluate.   2.  Right ovarian dermoid, unchanged from recent PET-CT examination.   3. No adenopathy by size criteria evident. Appearance of subcentimeter mesenteric lymph nodes is stable compared to recent study.   4. No bone lesions appreciable by CT. Areas of abnormal radiotracer uptake in the mid lumbar spine noted on recent PET-CT examination. No destruction 2 or lytic change noted in the lumbar vertebrae currently there.   5. No bowel obstruction. No abscess. No periappendiceal region inflammation.   6.  Small hiatal hernia with fluid in distal esophagus.   7.  Foci of coronary artery calcification, advanced for age.   8.  Spleen normal in size and contour.   9. Small hiatal hernia with mild fluid in the distal esophagus, likely indicative of a degree of reflux.   01/04/2018 Imaging   1. No findings to suggest residual/recurrent lymphoma in the chest, abdomen or pelvis. 2. Right ovarian dermoid slightly larger than prior examinations, currently measuring 3.8 x 2.8 x 3.1 cm. 3. Additional incidental findings, as above.   07/11/2018 Imaging   No findings  suspicious for recurrent lymphoma. Spleen is normal in size.   Stable right ovarian dermoid.   4 mm subpleural nodular opacity in the posterior right lower lobe, favoring subpleural atelectasis.   07/17/2019 Imaging   1. Stable exam. No new or progressive findings to suggest recurrent disease. 2. Stable right adnexal dermoid. 3. Emphysema (ICD10-J43.9).   09/19/2019 Procedure   Successful right IJ vein Port-A-Cath explant.   12/05/2019 Imaging   1. Stable exam. No findings to suggest recurrent lymphoma. 2. 9 mm left para-aortic node is upper normal for size and mildly increased from 7 mm previously. Attention on follow-up recommended.  3. Stable right adnexal dermoid. 4. Emphysema (ICD10-J43.9).       PHYSICAL EXAMINATION: ECOG PERFORMANCE STATUS: 0 - Asymptomatic  Vitals:   07/03/21 0854  BP: 120/69  Pulse: 66  Resp: 16  Temp: 98.6 F (37 C)  SpO2: 100%   Filed Weights   07/03/21 0854  Weight: 112 lb 12.8 oz (51.2 kg)    GENERAL:alert, no distress and comfortable SKIN: skin color, texture, turgor are normal, no rashes or significant lesions EYES: normal, Conjunctiva are pink and non-injected, sclera clear OROPHARYNX:no exudate, no erythema and lips, buccal mucosa, and tongue normal  NECK: supple, thyroid normal size, non-tender, without nodularity LYMPH:  no palpable lymphadenopathy in the cervical, axillary or inguinal LUNGS: clear to auscultation and percussion with normal breathing effort HEART: regular rate & rhythm and no murmurs and no lower extremity edema  ABDOMEN:abdomen soft, non-tender and normal bowel sounds Musculoskeletal:no cyanosis of digits and no clubbing  NEURO: alert & oriented x 3 with fluent speech, no focal motor/sensory deficits  LABORATORY DATA:  I have reviewed the data as listed    Component Value Date/Time   NA 135 04/26/2021 2330   NA 140 04/29/2017 0745   K 3.4 (L) 04/26/2021 2330   K 3.3 (L) 04/29/2017 0745   CL 105 04/26/2021  2330   CO2 23 04/26/2021 2330   CO2 23 04/29/2017 0745   GLUCOSE 86 04/26/2021 2330   GLUCOSE 83 04/29/2017 0745   BUN 10 04/26/2021 2330   BUN 10.7 04/29/2017 0745   CREATININE 1.15 (H) 04/26/2021 2330   CREATININE 1.2 (H) 04/29/2017 0745   CALCIUM 8.9 04/26/2021 2330   CALCIUM 9.8 04/29/2017 0745   PROT 7.5 04/26/2021 2330   PROT 7.6 04/29/2017 0745   ALBUMIN 4.3 04/26/2021 2330   ALBUMIN 4.0 04/29/2017 0745   AST 21 04/26/2021 2330   AST 19 04/29/2017 0745   ALT 19 04/26/2021 2330   ALT 19 04/29/2017 0745   ALKPHOS 55 04/26/2021 2330   ALKPHOS 81 04/29/2017 0745   BILITOT 0.4 04/26/2021 2330   BILITOT 1.31 (H) 04/29/2017 0745   GFRNONAA >60 04/26/2021 2330   GFRNONAA 83 02/20/2016 0908   GFRAA >60 11/07/2019 0740   GFRAA >89 02/20/2016 0908    No results found for: SPEP, UPEP  Lab Results  Component Value Date   WBC 7.8 07/03/2021   NEUTROABS 4.4 07/03/2021   HGB 13.8 07/03/2021   HCT 37.9 07/03/2021   MCV 97.7 07/03/2021   PLT 291 07/03/2021      Chemistry      Component Value Date/Time   NA 135 04/26/2021 2330   NA 140 04/29/2017 0745   K 3.4 (L) 04/26/2021 2330   K 3.3 (L) 04/29/2017 0745   CL 105 04/26/2021 2330   CO2 23 04/26/2021 2330   CO2 23 04/29/2017 0745   BUN 10 04/26/2021 2330   BUN 10.7 04/29/2017 0745   CREATININE 1.15 (H) 04/26/2021 2330   CREATININE 1.2 (H) 04/29/2017 0745      Component Value Date/Time   CALCIUM 8.9 04/26/2021 2330   CALCIUM 9.8 04/29/2017 0745   ALKPHOS 55 04/26/2021 2330   ALKPHOS 81 04/29/2017 0745   AST 21 04/26/2021 2330   AST 19 04/29/2017 0745   ALT 19 04/26/2021 2330   ALT 19 04/29/2017 0745   BILITOT 0.4 04/26/2021 2330   BILITOT 1.31 (H) 04/29/2017 0745

## 2021-07-03 NOTE — Assessment & Plan Note (Signed)
She was prescribed Megace to stimulate appetite and weight gain ?I suspect this is the cause of her amenorrhea ?

## 2021-07-03 NOTE — Telephone Encounter (Signed)
Left a VM asking patient to return my call. My chart message also sent to patient.  ? ? ?[name] P [name], CMA ? ?

## 2021-07-03 NOTE — Assessment & Plan Note (Signed)
She has no clinical signs or symptoms of recurrence ?Her last imaging study was almost 2 years ago ?In the absence of symptoms, I do not feel strongly we need to order serial surveillance imaging studies ?I plan to see her again in 4 months for further follow-up ?

## 2021-07-03 NOTE — Telephone Encounter (Signed)
Patient called stating that she went to see her oncologist today and informed her of not having a period x 2 months and was told to call our office. Oncologist thinks it is due to taking megestrol. Patient doesn't have a GYN. Please advise.  ? ? ? ?[name] P [name], CMA  ?

## 2021-07-04 ENCOUNTER — Other Ambulatory Visit: Payer: Self-pay | Admitting: Hematology and Oncology

## 2021-07-04 ENCOUNTER — Telehealth: Payer: Self-pay

## 2021-07-04 DIAGNOSIS — B20 Human immunodeficiency virus [HIV] disease: Secondary | ICD-10-CM

## 2021-07-04 MED ORDER — TRAMADOL HCL 50 MG PO TABS: 100.0000 mg | ORAL_TABLET | Freq: Four times a day (QID) | ORAL | 0 refills | Status: DC | PRN

## 2021-07-04 NOTE — Telephone Encounter (Signed)
done

## 2021-07-04 NOTE — Telephone Encounter (Signed)
She called and left a message requesting a Tramadol refill. 

## 2021-07-18 ENCOUNTER — Telehealth: Payer: Self-pay

## 2021-07-18 ENCOUNTER — Other Ambulatory Visit: Payer: Self-pay | Admitting: Hematology and Oncology

## 2021-07-18 DIAGNOSIS — B20 Human immunodeficiency virus [HIV] disease: Secondary | ICD-10-CM

## 2021-07-18 MED ORDER — TRAMADOL HCL 50 MG PO TABS: 100.0000 mg | ORAL_TABLET | Freq: Four times a day (QID) | ORAL | 0 refills | Status: DC | PRN

## 2021-07-18 NOTE — Telephone Encounter (Signed)
Pt called to request refill on Tramadol to be sent to [facility] on [address]. Pt takes Tramadol 100 mg 2 tabs PO Q6H PRN  pain. Last filled 07/04/21. Returned pt's call and LVM to let pt know I would made MD aware of her request. ?

## 2021-07-18 NOTE — Telephone Encounter (Signed)
done

## 2021-07-30 ENCOUNTER — Other Ambulatory Visit: Payer: Self-pay

## 2021-07-30 ENCOUNTER — Encounter: Payer: Self-pay | Admitting: Internal Medicine

## 2021-07-30 ENCOUNTER — Ambulatory Visit (INDEPENDENT_AMBULATORY_CARE_PROVIDER_SITE_OTHER): Payer: 59 | Admitting: Internal Medicine

## 2021-07-30 ENCOUNTER — Telehealth: Payer: Self-pay

## 2021-07-30 VITALS — BP 103/68 | HR 89 | Temp 98.2°F | Wt 110.0 lb

## 2021-07-30 DIAGNOSIS — N979 Female infertility, unspecified: Secondary | ICD-10-CM | POA: Diagnosis not present

## 2021-07-30 DIAGNOSIS — E43 Unspecified severe protein-calorie malnutrition: Secondary | ICD-10-CM | POA: Diagnosis not present

## 2021-07-30 DIAGNOSIS — N926 Irregular menstruation, unspecified: Secondary | ICD-10-CM | POA: Diagnosis not present

## 2021-07-30 DIAGNOSIS — R636 Underweight: Secondary | ICD-10-CM | POA: Diagnosis not present

## 2021-07-30 MED ORDER — DRONABINOL 5 MG PO CAPS: 5.0000 mg | ORAL_CAPSULE | Freq: Two times a day (BID) | ORAL | 5 refills | Status: DC

## 2021-07-30 NOTE — Assessment & Plan Note (Signed)
If this does not improve in the next 1-2 cycles off of the megestrol, may be another etiology and can see gynecology for this.   ?

## 2021-07-30 NOTE — Progress Notes (Signed)
? ?  Subjective:  ? ? Patient ID: [name], female    DOB: 02-Dec-1985, 36 y.o.   MRN: [id number] ? ?HPI ?She is here for a work in visit.  ?I started her on megestrol for weight gain and she had a good effect.  Unfortunately, she developed significant menstrual irregularities since starting it, though also with recent COVID-19 infection, symptomatic.  She has stopped the megestrol for about 1 month.  She denies any sexual activity for over 1 year and declines pregnancy testing.  ? ? ?Review of Systems  ?Gastrointestinal:  Negative for diarrhea.  ?Genitourinary:  Positive for menstrual problem.  ? ?   ?Objective:  ? Physical Exam ?Eyes:  ?   General: No scleral icterus. ?Neurological:  ?   Mental Status: She is alert.  ? ? ? ? ? ?   ?Assessment & Plan:  ? ? ?

## 2021-07-30 NOTE — Telephone Encounter (Signed)
Pt called and requests refill on Tramadol 50 mg 2 tab PO Q6H PRN pain. Pt requests the refill be sent to [facility] on [address] in [place]. Pt knows I have made MD aware of request.  ?

## 2021-07-30 NOTE — Assessment & Plan Note (Signed)
She had good weight gain but her menstrual issues may be related to the megestrol.  I will change her to marinol.  She will follow up if she is not getting the weight gain after starting this.  ?

## 2021-07-30 NOTE — Assessment & Plan Note (Signed)
I will re-refer her to gynecology ?

## 2021-07-31 ENCOUNTER — Telehealth: Payer: Self-pay

## 2021-07-31 ENCOUNTER — Other Ambulatory Visit: Payer: Self-pay | Admitting: Hematology and Oncology

## 2021-07-31 DIAGNOSIS — B20 Human immunodeficiency virus [HIV] disease: Secondary | ICD-10-CM

## 2021-07-31 MED ORDER — TRAMADOL HCL 50 MG PO TABS: 100.0000 mg | ORAL_TABLET | Freq: Four times a day (QID) | ORAL | 0 refills | Status: DC | PRN

## 2021-07-31 NOTE — Telephone Encounter (Signed)
She called and left a message requesting Tramadol refill. 

## 2021-07-31 NOTE — Telephone Encounter (Signed)
[facility] Patient Advocate Encounter ?  ?Received notification from [facility] that prior authorization for Marinol is required. ?  ?PA submitted on 07/31/21 ?Key [id number] ?Status is pending ?   ?[facility] will continue to follow. ? ? ?Ileene Patrick, CPhT ?Specialty Pharmacy Patient Advocate ?[facility] ?Phone: [phone] ?Fax:  [phone]  ?

## 2021-07-31 NOTE — Telephone Encounter (Signed)
Called and given below message. She verbalized understanding. 

## 2021-07-31 NOTE — Telephone Encounter (Signed)
done

## 2021-08-01 ENCOUNTER — Encounter: Payer: Self-pay | Admitting: Hematology and Oncology

## 2021-08-01 ENCOUNTER — Other Ambulatory Visit (HOSPITAL_COMMUNITY): Payer: Self-pay

## 2021-08-01 ENCOUNTER — Telehealth: Payer: Self-pay

## 2021-08-01 NOTE — Telephone Encounter (Signed)
[facility] Patient Advocate Encounter ? ?Prior Authorization for Marinol (generic) has been approved.   ? ?PA# [id number] ?Effective dates: 08/01/21 through 10/30/21 ? ?Patients co-pay is $0.00.  ? ?[facility] will continue to follow. ? ?Ileene Patrick, CPhT ?Specialty Pharmacy Patient Advocate ?[facility] ?Phone: [phone] ?Fax:  [phone]  ?

## 2021-08-06 ENCOUNTER — Encounter: Payer: Self-pay | Admitting: Hematology and Oncology

## 2021-08-11 ENCOUNTER — Telehealth: Payer: Self-pay

## 2021-08-11 ENCOUNTER — Other Ambulatory Visit: Payer: Self-pay | Admitting: Hematology and Oncology

## 2021-08-11 DIAGNOSIS — B20 Human immunodeficiency virus [HIV] disease: Secondary | ICD-10-CM

## 2021-08-11 MED ORDER — TRAMADOL HCL 50 MG PO TABS: 100.0000 mg | ORAL_TABLET | Freq: Four times a day (QID) | ORAL | 0 refills | Status: DC | PRN

## 2021-08-11 NOTE — Telephone Encounter (Signed)
done

## 2021-08-11 NOTE — Telephone Encounter (Signed)
She called and left a message requesting Tramadol refill. 

## 2021-08-25 ENCOUNTER — Other Ambulatory Visit: Payer: Self-pay | Admitting: *Deleted

## 2021-08-25 DIAGNOSIS — B20 Human immunodeficiency virus [HIV] disease: Secondary | ICD-10-CM

## 2021-08-25 MED ORDER — TRAMADOL HCL 50 MG PO TABS: 100.0000 mg | ORAL_TABLET | Freq: Four times a day (QID) | ORAL | 0 refills | Status: DC | PRN

## 2021-08-25 NOTE — Telephone Encounter (Signed)
Notified patient that refill sent to pharmacy ?

## 2021-08-25 NOTE — Telephone Encounter (Signed)
Patient called - requested refill of Tramadol be sent to [facility] on [address]. ?Routed to Dr. Alvy Bimler ?

## 2021-09-05 ENCOUNTER — Telehealth: Payer: Self-pay

## 2021-09-05 ENCOUNTER — Other Ambulatory Visit: Payer: Self-pay | Admitting: Hematology and Oncology

## 2021-09-05 DIAGNOSIS — B20 Human immunodeficiency virus [HIV] disease: Secondary | ICD-10-CM

## 2021-09-05 MED ORDER — TRAMADOL HCL 50 MG PO TABS: 100.0000 mg | ORAL_TABLET | Freq: Four times a day (QID) | ORAL | 0 refills | Status: DC | PRN

## 2021-09-05 NOTE — Telephone Encounter (Signed)
She called and requested refill of Tramadol Rx to [facility]. ?

## 2021-09-05 NOTE — Telephone Encounter (Signed)
Called and given below message. She verbalized understanding. 

## 2021-09-05 NOTE — Telephone Encounter (Signed)
done

## 2021-09-17 ENCOUNTER — Telehealth: Payer: Self-pay

## 2021-09-17 ENCOUNTER — Other Ambulatory Visit: Payer: Self-pay | Admitting: Hematology

## 2021-09-17 DIAGNOSIS — B20 Human immunodeficiency virus [HIV] disease: Secondary | ICD-10-CM

## 2021-09-17 MED ORDER — TRAMADOL HCL 50 MG PO TABS: 100.0000 mg | ORAL_TABLET | Freq: Four times a day (QID) | ORAL | 0 refills | Status: DC | PRN

## 2021-09-17 NOTE — Telephone Encounter (Signed)
She called and requested a tramadol refill to pharmacy.

## 2021-09-17 NOTE — Telephone Encounter (Signed)
Called and left a message Tramadol Rx sent to pharmacy. Ask her call the office for questions.

## 2021-09-24 ENCOUNTER — Telehealth: Payer: Self-pay

## 2021-09-24 NOTE — Telephone Encounter (Signed)
She called and left a message requesting a refill of the Hydroxyzine Rx for itching. She has tried benadryl and zyrtec for the itching with no relief.

## 2021-09-25 ENCOUNTER — Encounter: Payer: Self-pay | Admitting: Hematology and Oncology

## 2021-09-25 ENCOUNTER — Other Ambulatory Visit: Payer: Self-pay | Admitting: Hematology and Oncology

## 2021-09-25 MED ORDER — HYDROXYZINE HCL 10 MG PO TABS: 10.0000 mg | ORAL_TABLET | Freq: Three times a day (TID) | ORAL | 0 refills | Status: DC | PRN

## 2021-09-25 NOTE — Telephone Encounter (Signed)
Called and left a message Rx sent. Ask her to call the office back if needed. 

## 2021-09-25 NOTE — Telephone Encounter (Signed)
Sent to [facility].

## 2021-09-29 ENCOUNTER — Other Ambulatory Visit: Payer: Self-pay | Admitting: Hematology and Oncology

## 2021-09-29 ENCOUNTER — Telehealth: Payer: Self-pay

## 2021-09-29 DIAGNOSIS — B20 Human immunodeficiency virus [HIV] disease: Secondary | ICD-10-CM

## 2021-09-29 MED ORDER — TRAMADOL HCL 50 MG PO TABS: 100.0000 mg | ORAL_TABLET | Freq: Four times a day (QID) | ORAL | 0 refills | Status: DC | PRN

## 2021-09-29 NOTE — Telephone Encounter (Signed)
done

## 2021-09-29 NOTE — Telephone Encounter (Signed)
She called and left a message requesting Tramadol Rx sent to pharmacy.

## 2021-09-29 NOTE — Telephone Encounter (Signed)
Called and told Rx sent. Reviewed appts. She verbalized understanding.

## 2021-10-07 ENCOUNTER — Other Ambulatory Visit: Payer: Self-pay | Admitting: Internal Medicine

## 2021-10-07 DIAGNOSIS — B20 Human immunodeficiency virus [HIV] disease: Secondary | ICD-10-CM

## 2021-10-10 ENCOUNTER — Other Ambulatory Visit: Payer: Self-pay | Admitting: Hematology and Oncology

## 2021-10-10 ENCOUNTER — Telehealth: Payer: Self-pay

## 2021-10-10 DIAGNOSIS — B20 Human immunodeficiency virus [HIV] disease: Secondary | ICD-10-CM

## 2021-10-10 MED ORDER — TRAMADOL HCL 50 MG PO TABS: 100.0000 mg | ORAL_TABLET | Freq: Four times a day (QID) | ORAL | 0 refills | Status: DC | PRN

## 2021-10-10 NOTE — Telephone Encounter (Signed)
done

## 2021-10-10 NOTE — Telephone Encounter (Signed)
She called and left a message requesting Tramadol refill to previous pharmacy.  

## 2021-10-10 NOTE — Telephone Encounter (Signed)
Called and told Rx sent. She verbalized understanding and appreciated the call. 

## 2021-10-20 ENCOUNTER — Telehealth: Payer: Self-pay

## 2021-10-20 ENCOUNTER — Other Ambulatory Visit: Payer: Self-pay | Admitting: Hematology and Oncology

## 2021-10-20 DIAGNOSIS — B20 Human immunodeficiency virus [HIV] disease: Secondary | ICD-10-CM

## 2021-10-20 MED ORDER — TRAMADOL HCL 50 MG PO TABS: 100.0000 mg | ORAL_TABLET | Freq: Four times a day (QID) | ORAL | 0 refills | Status: DC | PRN

## 2021-10-20 NOTE — Telephone Encounter (Signed)
Called and given below message Rx sent. She verbalized understanding.

## 2021-10-29 ENCOUNTER — Other Ambulatory Visit: Payer: Self-pay | Admitting: Hematology and Oncology

## 2021-10-30 ENCOUNTER — Encounter: Payer: Self-pay | Admitting: Hematology and Oncology

## 2021-11-05 ENCOUNTER — Other Ambulatory Visit: Payer: Self-pay | Admitting: Hematology and Oncology

## 2021-11-05 DIAGNOSIS — B20 Human immunodeficiency virus [HIV] disease: Secondary | ICD-10-CM

## 2021-11-05 MED ORDER — TRAMADOL HCL 50 MG PO TABS: 100.0000 mg | ORAL_TABLET | Freq: Four times a day (QID) | ORAL | 0 refills | Status: DC | PRN

## 2021-11-06 ENCOUNTER — Inpatient Hospital Stay (HOSPITAL_BASED_OUTPATIENT_CLINIC_OR_DEPARTMENT_OTHER): Payer: 59 | Admitting: Hematology and Oncology

## 2021-11-06 ENCOUNTER — Inpatient Hospital Stay: Payer: 59 | Attending: Hematology and Oncology

## 2021-11-06 ENCOUNTER — Other Ambulatory Visit: Payer: Self-pay

## 2021-11-06 ENCOUNTER — Encounter: Payer: Self-pay | Admitting: Hematology and Oncology

## 2021-11-06 DIAGNOSIS — F17210 Nicotine dependence, cigarettes, uncomplicated: Secondary | ICD-10-CM | POA: Diagnosis not present

## 2021-11-06 DIAGNOSIS — G8929 Other chronic pain: Secondary | ICD-10-CM | POA: Diagnosis not present

## 2021-11-06 DIAGNOSIS — M2550 Pain in unspecified joint: Secondary | ICD-10-CM | POA: Insufficient documentation

## 2021-11-06 DIAGNOSIS — C8118 Nodular sclerosis classical Hodgkin lymphoma, lymph nodes of multiple sites: Secondary | ICD-10-CM | POA: Diagnosis present

## 2021-11-06 LAB — CBC WITH DIFFERENTIAL/PLATELET
Abs Immature Granulocytes: 0.01 10*3/uL (ref 0.00–0.07)
Basophils Absolute: 0 10*3/uL (ref 0.0–0.1)
Basophils Relative: 1 %
Eosinophils Absolute: 0.1 10*3/uL (ref 0.0–0.5)
Eosinophils Relative: 2 %
HCT: 40.3 % (ref 36.0–46.0)
Hemoglobin: 14.4 g/dL (ref 12.0–15.0)
Immature Granulocytes: 0 %
Lymphocytes Relative: 26 %
Lymphs Abs: 1.5 10*3/uL (ref 0.7–4.0)
MCH: 34.1 pg — ABNORMAL HIGH (ref 26.0–34.0)
MCHC: 35.7 g/dL (ref 30.0–36.0)
MCV: 95.5 fL (ref 80.0–100.0)
Monocytes Absolute: 0.5 10*3/uL (ref 0.1–1.0)
Monocytes Relative: 9 %
Neutro Abs: 3.4 10*3/uL (ref 1.7–7.7)
Neutrophils Relative %: 62 %
Platelets: 258 10*3/uL (ref 150–400)
RBC: 4.22 MIL/uL (ref 3.87–5.11)
RDW: 11.9 % (ref 11.5–15.5)
WBC: 5.6 10*3/uL (ref 4.0–10.5)
nRBC: 0 % (ref 0.0–0.2)

## 2021-11-06 LAB — COMPREHENSIVE METABOLIC PANEL
ALT: 16 U/L (ref 0–44)
AST: 18 U/L (ref 15–41)
Albumin: 4.2 g/dL (ref 3.5–5.0)
Alkaline Phosphatase: 62 U/L (ref 38–126)
Anion gap: 6 (ref 5–15)
BUN: 17 mg/dL (ref 6–20)
CO2: 27 mmol/L (ref 22–32)
Calcium: 9.3 mg/dL (ref 8.9–10.3)
Chloride: 106 mmol/L (ref 98–111)
Creatinine, Ser: 1.15 mg/dL — ABNORMAL HIGH (ref 0.44–1.00)
GFR, Estimated: >60 mL/min (ref >60)
Glucose, Bld: 86 mg/dL (ref 70–99)
Potassium: 3.9 mmol/L (ref 3.5–5.1)
Sodium: 139 mmol/L (ref 135–145)
Total Bilirubin: 0.3 mg/dL (ref 0.3–1.2)
Total Protein: 6.9 g/dL (ref 6.5–8.1)

## 2021-11-06 NOTE — Progress Notes (Signed)
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[place]) She has no clinical signs or symptoms of recurrence Her last imaging study was 2 years ago In the absence of symptoms, I do not feel strongly we need to order serial surveillance imaging studies I plan to see her again in 6 months for further follow-up The patient desire to get pregnant I told her it may not be possible given exposure to chemotherapy in the past that could have render her infertile  Chronic joint pain She has chronic musculoskeletal pain since discontinuation of treatment She will continue prescribed tramadol We discussed narcotic refill policy  Cigarette smoker We discussed the importance of nicotine cessation  No orders of the defined types were placed in this encounter.   All questions were answered. The patient knows to call the clinic with any problems, questions or concerns. The total time spent in the appointment was 20 minutes encounter with patients including review of chart and various tests results, discussions about plan of care and coordination of care plan   Heath Lark, MD 11/06/2021 8:51 AM  INTERVAL HISTORY: Please see below for problem oriented charting. she returns for surveillance follow-up She have lost weight since discontinuation of Megace She is attempting to get pregnant She is still smoking, unfortunately No new lymphadenopathy She is compliant taking her antiretroviral treatment Her chronic joint pain is stable  REVIEW OF SYSTEMS:   Constitutional: Denies fevers, chills or abnormal weight loss Eyes:  Denies blurriness of vision Ears, nose, mouth, throat, and face: Denies mucositis or sore throat Respiratory: Denies cough, dyspnea or wheezes Cardiovascular: Denies palpitation, chest discomfort or lower extremity swelling Gastrointestinal:  Denies nausea, heartburn or change in bowel habits Skin: Denies abnormal skin rashes Lymphatics: Denies new lymphadenopathy or easy bruising Neurological:Denies numbness, tingling or new weaknesses Behavioral/Psych: Mood is stable, no new changes  All other systems were reviewed with the patient and are negative.  I have reviewed the past medical history, past surgical history, social history and family history with the patient and they are unchanged from previous note.  ALLERGIES:  is allergic to temazepam.  MEDICATIONS:  Current Outpatient Medications  Medication Sig Dispense Refill   BIKTARVY 50-200-25 MG TABS tablet TAKE 1 TABLET BY MOUTH DAILY 30 tablet 3   cholecalciferol (VITAMIN D) 1000 units tablet Take 1 tablet (1,000 Units total) by mouth daily. 30 tablet 9   dronabinol (MARINOL) 5 MG capsule Take 1 capsule (5 mg total) by mouth 2 (two) times daily before a meal. 60 capsule 5   hydrOXYzine (ATARAX) 10 MG tablet TAKE 1 TABLET(10 MG) BY MOUTH THREE TIMES DAILY AS NEEDED 90 tablet 0   ondansetron (ZOFRAN) 4 MG tablet Take 1 tablet (4 mg total) by mouth every 6 (six) hours. 12 tablet 0   traMADol (ULTRAM) 50 MG tablet Take 2 tablets (100 mg total) by mouth every 6 (six) hours as needed. 90 tablet 0   No current facility-administered medications for this visit.    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   05/07/2014 Imaging  CT scan of the chest show right thoracic inlet lymphadenopathy   06/07/2014 Procedure   She underwent ultrasound-guided core biopsy of the neck lymph node   06/07/2014 Pathology  Results   Accession: [id number] biopsy confirmed diagnosis of Hodgkin lymphoma.   06/15/2014 Imaging   Echocardiogram showed preserved ejection fraction   07/09/2014 - 07/12/2014 Hospital Admission   She was admitted to the hospital for severe anemia.   07/27/2014 Procedure   She had placement of port   07/31/2014 - 09/11/2014 Chemotherapy   She received dose adjusted chemotherapy due to abnormal liver function tests and severe anemia. Treatment was delayed due to noncompliance  and subsequently stopped because the patient failed to keep appointments   01/11/2015 Imaging   Repeat PET CT scan showed response to treatment   01/28/2015 - 06/18/2015 Chemotherapy   ABVD was restarted with full dose.   02/08/2015 - 02/10/2015 Hospital Admission   The patient was admitted to the hospital due to pancytopenia and profuse diarrhea. Cultures were negative. She was placed on ciprofloxacin.   02/11/2015 Adverse Reaction   Treatment was placed on hold due to recent infection.   04/11/2015 Imaging   PET CT scan showed near complete response. Incidental finding of an abnormal bone lesion, indeterminate. She is not symptomatic. Recommendation from [facility] to observe   07/11/2015 Imaging   PET CT scan showed abnormal new bone lesions, suggestive of possible disease progression   07/23/2015 Bone Marrow Biopsy   She underwent bone biopsy   07/23/2015 Pathology Results   Accession: [id number]  biopsy was negative for cancer   11/21/2015 Surgery   She had surgery for ectopic pregnancy   01/29/2016 Imaging   Ct chest, abdomen and pelvis showed pelvic and retroperitoneal lymphadenopathy, as above, concerning for residual disease. There is also a mildly enlarged posterior mediastinal lymph node measuring 11 mm adjacent to the distal descending thoracic aorta. This may represent an additional focus of disease, but is the only finding of concern in the thorax on today's examination. Sclerosis in the right ilium at site  of previously noted metabolically active lesion, grossly unchanged. No other definite osseous lesions are identified on today's examination. Spleen is normal in size and appearance.   02/14/2016 PET scan   Interval disease worsening with new foci of hypermetabolic activity in multiple retroperitoneal and pelvic lymph nodes as well as AP window and left hilar lymph nodes. (Deauville 5). There is also overall worsening of the osseous disease.   05/11/2016 Pathology Results   Diagnosis Lymph node, needle/core biopsy, Left para-aortic retroperitoneal - CLASSICAL HODGKIN LYMPHOMA. - SEE ONCOLOGY TABLE. Microscopic Comment LYMPHOMA Histologic type: Classical Hodgkin lymphoma. Grade (if applicable): N/A Flow cytometry: Not done. Immunohistochemical stains: CD15, CD20, CD3, LCA, PAX-5, CD30 with appropriate controls. Touch preps/imprints: Not performed. Comments: The sections show small needle core biopsy fragments displaying a polymorphous cellular proliferation of small lymphocytes, plasma cells, eosinophils, and large atypical mononuclear and multilobated lymphoid cells with features of Reed-Sternberg cells and variants. This is associated with patchy areas of fibrosis. Immunohistochemical stains were performed and show that the large atypical lymphoid cells are positive for CD30, CD15 and PAX-5 and negative for LCA, CD20, CD3. The small lymphoid cells in the background show a mixture of T and B cells with predominance of T cells. The overall morphologic and histologic features are consistent with classical Hodgkin lymphoma. Further subtyping is challenging in limited small biopsy fragments but the patchy fibrosis suggests nodular sclerosis type.    05/11/2016 Procedure  She underwent CT guided biopsy of retroperitoneal lymph node   05/26/2016 Procedure   Successful placement of a right IJ approach Power Port with ultrasound and fluoroscopic guidance. The catheter is ready for use.   06/02/2016 PET  scan   Mixed response to chemotherapy with some lymph nodes decreased in metabolic activity and some lymph nodes increase metabolic activity. Lymph node stations including mediastinum, periaortic retroperitoneum, and obturator node stations. Activity is remains relatively intense Deauville 4 2. LEFT infrahilar nodule / lymph node with intense metabolic activity decreased from prior. ( Deauville 4 ). 3. 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 Deauville 5   06/03/2016 - 06/05/2016 Hospital Admission   She was admitted to the hospital for cycle 1 of ICE chemotherapy   06/24/2016 - 06/26/2016 Hospital Admission   She received cycle 2 of ICE chemo   07/07/2016 PET scan   Resolution of prior hypermetabolic adenopathy and resolution of prior osseous foci of hypermetabolic activity compatible with essentially complete response to therapy (Deauville 1). 2. Generalized reduced activity in the L4 vertebral body and the type of finding which would typically reflect prior radiation therapy. 3. Stable septated fatty right pelvic lesion, possibly a dermoid or lipoma, not hypermetabolic   3/89/3734 PET scan   Hypermetabolic lesion along the L3 vertebral body and left posterior elements, max SUV 7.8 (Deauville 5). Hypermetabolic lesion along the left inferior pubic ramus, max SUV 4.9 (Deauville 4).  IMPRESSION: Prevascular lymphadenopathy, reflecting nodal recurrence (Deauville 4). Hypermetabolic osseous metastases involving the L3 vertebral body/posterior elements and left inferior pubic ramus (Deauville 4-5). Hypermetabolism along the endometrium, new, possibly reactive/physiologic. Consider pelvic ultrasound and/or endometrial sampling as clinically warranted.   11/04/2016 - 02/24/2017 Chemotherapy   She received Brentuximab   12/11/2016 PET scan   1. Mixed response to therapy within the skeleton. Lesions at L3 is decreased in size and metabolic activity. Residual activity remains  above liver ( Deauville 4) 2. Increased activity in the LEFT sacrum with metabolic activity above liver activity ( Deauville 4 3. Decrease in size and metabolic activity of anterior mediastinal tissue consistent with resolution of thymic hyperplasia or resolution of lymphoma metabolic activity ( Deauville 2). 4. No new lymphadenopathy.  Normal spleen and liver.   03/25/2017 PET scan   1. Increased metabolic activity in several normal sized retroperitoneal and right pelvic lymph nodes, Deauville 4. 2. Increase in size and metabolic activity of lesions in the L3 and L4 vertebral bodies and in the left sacrum, Deauville 5. 3. No recurrence of anterior mediastinal abnormal activity. 4. Other imaging findings of potential clinical significance: Chronic ethmoid and left maxillary sinusitis. Paraseptal emphysema at the lung apices. Right ovarian dermoid.   04/06/2017 -  Chemotherapy   The patient had Keytruda   04/06/2017 - 05/23/2019 Chemotherapy   The patient had pembrolizumab (KEYTRUDA) 200 mg in sodium chloride 0.9 % 50 mL chemo infusion, 200 mg, Intravenous, Once, 30 of 31 cycles Administration: 200 mg (04/06/2017), 200 mg (04/29/2017), 200 mg (05/20/2017), 200 mg (06/10/2017), 200 mg (07/01/2017), 200 mg (07/22/2017), 200 mg (08/12/2017), 200 mg (09/02/2017), 200 mg (10/04/2017), 200 mg (11/03/2017), 200 mg (12/02/2017), 200 mg (12/30/2017), 200 mg (01/27/2018), 200 mg (02/24/2018), 200 mg (03/22/2018), 200 mg (04/19/2018), 200 mg (05/17/2018), 200 mg (06/14/2018), 200 mg (07/12/2018), 200 mg (08/09/2018), 200 mg (09/06/2018), 200 mg (10/04/2018), 200 mg (11/01/2018), 200 mg (11/29/2018), 200 mg (12/27/2018), 200 mg (01/24/2019), 200 mg (03/14/2019), 200 mg (05/23/2019)  for chemotherapy treatment.    06/09/2017 PET  scan   1. Overall significant improvement with reduction in nodal activity. Several retroperitoneal nodes which were previously Deauville 4 are currently Deauville 3. The right common iliac lymph node remains at  Deauville 4 with maximum SUV of 3.4, but is improved from prior SUV of 4.3. 2. The bony lesions show the greatest improvement, with the previously Deauville 5 hypermetabolic lesions currently no longer of higher metabolic activity than surrounding bone marrow, currently measuring at Deauville 3. 3. 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 4. No new lesions are identified. 5.  Emphysema (ICD10-J43.9).   08/05/2017 Imaging   1. Somewhat complex left adnexal lesion, potentially hemorrhagic cyst measuring 2.9 x 2.8 cm. The possibility of torsion in the left ovary must be considered. Advise correlation with pelvic ultrasound including Doppler assessment to further evaluate.   2.  Right ovarian dermoid, unchanged from recent PET-CT examination.   3. No adenopathy by size criteria evident. Appearance of subcentimeter mesenteric lymph nodes is stable compared to recent study.   4. No bone lesions appreciable by CT. Areas of abnormal radiotracer uptake in the mid lumbar spine noted on recent PET-CT examination. No destruction 2 or lytic change noted in the lumbar vertebrae currently there.   5. No bowel obstruction. No abscess. No periappendiceal region inflammation.   6.  Small hiatal hernia with fluid in distal esophagus.   7.  Foci of coronary artery calcification, advanced for age.   8.  Spleen normal in size and contour.   9. Small hiatal hernia with mild fluid in the distal esophagus, likely indicative of a degree of reflux.   01/04/2018 Imaging   1. No findings to suggest residual/recurrent lymphoma in the chest, abdomen or pelvis. 2. Right ovarian dermoid slightly larger than prior examinations, currently measuring 3.8 x 2.8 x 3.1 cm. 3. Additional incidental findings, as above.   07/11/2018 Imaging   No findings suspicious for recurrent lymphoma. Spleen is normal in size.   Stable right ovarian dermoid.   4 mm subpleural nodular opacity in  the posterior right lower lobe, favoring subpleural atelectasis.   07/17/2019 Imaging   1. Stable exam. No new or progressive findings to suggest recurrent disease. 2. Stable right adnexal dermoid. 3. Emphysema (ICD10-J43.9).   09/19/2019 Procedure   Successful right IJ vein Port-A-Cath explant.   12/05/2019 Imaging   1. Stable exam. No findings to suggest recurrent lymphoma. 2. 9 mm left para-aortic node is upper normal for size and mildly increased from 7 mm previously. Attention on follow-up recommended.  3. Stable right adnexal dermoid. 4. Emphysema (ICD10-J43.9).       PHYSICAL EXAMINATION: ECOG PERFORMANCE STATUS: 1 - Symptomatic but completely ambulatory  Vitals:   11/06/21 0808  BP: 99/70  Pulse: 78  Resp: 18  SpO2: 100%   Filed Weights   11/06/21 0808  Weight: 108 lb 6.4 oz (49.2 kg)    GENERAL:alert, no distress and comfortable SKIN: skin color, texture, turgor are normal, no rashes or significant lesions EYES: normal, Conjunctiva are pink and non-injected, sclera clear OROPHARYNX:no exudate, no erythema and lips, buccal mucosa, and tongue normal  NECK: supple, thyroid normal size, non-tender, without nodularity LYMPH:  no palpable lymphadenopathy in the cervical, axillary or inguinal LUNGS: clear to auscultation and percussion with normal breathing effort HEART: regular rate & rhythm and no murmurs and no lower extremity edema ABDOMEN:abdomen soft, non-tender and normal bowel sounds Musculoskeletal:no cyanosis of digits and no clubbing  NEURO: alert & oriented x 3 with  fluent speech, no focal motor/sensory deficits  LABORATORY DATA:  I have reviewed the data as listed    Component Value Date/Time   NA 139 11/06/2021 0740   NA 140 04/29/2017 0745   K 3.9 11/06/2021 0740   K 3.3 (L) 04/29/2017 0745   CL 106 11/06/2021 0740   CO2 27 11/06/2021 0740   CO2 23 04/29/2017 0745   GLUCOSE 86 11/06/2021 0740   GLUCOSE 83 04/29/2017 0745   BUN 17 11/06/2021  0740   BUN 10.7 04/29/2017 0745   CREATININE 1.15 (H) 11/06/2021 0740   CREATININE 1.2 (H) 04/29/2017 0745   CALCIUM 9.3 11/06/2021 0740   CALCIUM 9.8 04/29/2017 0745   PROT 6.9 11/06/2021 0740   PROT 7.6 04/29/2017 0745   ALBUMIN 4.2 11/06/2021 0740   ALBUMIN 4.0 04/29/2017 0745   AST 18 11/06/2021 0740   AST 19 04/29/2017 0745   ALT 16 11/06/2021 0740   ALT 19 04/29/2017 0745   ALKPHOS 62 11/06/2021 0740   ALKPHOS 81 04/29/2017 0745   BILITOT 0.3 11/06/2021 0740   BILITOT 1.31 (H) 04/29/2017 0745   GFRNONAA >60 11/06/2021 0740   GFRNONAA 83 02/20/2016 0908   GFRAA >60 11/07/2019 0740   GFRAA >89 02/20/2016 0908    No results found for: "SPEP", "UPEP"  Lab Results  Component Value Date   WBC 5.6 11/06/2021   NEUTROABS 3.4 11/06/2021   HGB 14.4 11/06/2021   HCT 40.3 11/06/2021   MCV 95.5 11/06/2021   PLT 258 11/06/2021      Chemistry      Component Value Date/Time   NA 139 11/06/2021 0740   NA 140 04/29/2017 0745   K 3.9 11/06/2021 0740   K 3.3 (L) 04/29/2017 0745   CL 106 11/06/2021 0740   CO2 27 11/06/2021 0740   CO2 23 04/29/2017 0745   BUN 17 11/06/2021 0740   BUN 10.7 04/29/2017 0745   CREATININE 1.15 (H) 11/06/2021 0740   CREATININE 1.2 (H) 04/29/2017 0745      Component Value Date/Time   CALCIUM 9.3 11/06/2021 0740   CALCIUM 9.8 04/29/2017 0745   ALKPHOS 62 11/06/2021 0740   ALKPHOS 81 04/29/2017 0745   AST 18 11/06/2021 0740   AST 19 04/29/2017 0745   ALT 16 11/06/2021 0740   ALT 19 04/29/2017 0745   BILITOT 0.3 11/06/2021 0740   BILITOT 1.31 (H) 04/29/2017 0745

## 2021-11-06 NOTE — Assessment & Plan Note (Signed)
She has no clinical signs or symptoms of recurrence Her last imaging study was 2 years ago In the absence of symptoms, I do not feel strongly we need to order serial surveillance imaging studies I plan to see her again in 6 months for further follow-up The patient desire to get pregnant I told her it may not be possible given exposure to chemotherapy in the past that could have render her infertile

## 2021-11-06 NOTE — Assessment & Plan Note (Signed)
She has chronic musculoskeletal pain since discontinuation of treatment She will continue prescribed tramadol We discussed narcotic refill policy 

## 2021-11-06 NOTE — Assessment & Plan Note (Signed)
We discussed the importance of nicotine cessation

## 2021-11-17 ENCOUNTER — Other Ambulatory Visit: Payer: Self-pay | Admitting: Hematology and Oncology

## 2021-11-17 ENCOUNTER — Telehealth: Payer: Self-pay

## 2021-11-17 DIAGNOSIS — B20 Human immunodeficiency virus [HIV] disease: Secondary | ICD-10-CM

## 2021-11-17 MED ORDER — TRAMADOL HCL 50 MG PO TABS: 100.0000 mg | ORAL_TABLET | Freq: Four times a day (QID) | ORAL | 0 refills | Status: DC | PRN

## 2021-11-17 NOTE — Telephone Encounter (Signed)
Called and given below message. She verbalized understanding. 

## 2021-11-17 NOTE — Telephone Encounter (Signed)
She called and left a message requesting refill to of Tramadol to [facility].

## 2021-11-17 NOTE — Telephone Encounter (Signed)
Done

## 2021-11-27 ENCOUNTER — Encounter: Payer: Self-pay | Admitting: Internal Medicine

## 2021-11-27 ENCOUNTER — Other Ambulatory Visit (HOSPITAL_COMMUNITY)
Admission: RE | Admit: 2021-11-27 | Discharge: 2021-11-27 | Disposition: A | Discharge status: Home / Self Care | Payer: 59 | Source: Ambulatory Visit | Attending: Internal Medicine | Admitting: Internal Medicine

## 2021-11-27 ENCOUNTER — Ambulatory Visit (INDEPENDENT_AMBULATORY_CARE_PROVIDER_SITE_OTHER): Payer: 59 | Admitting: Internal Medicine

## 2021-11-27 ENCOUNTER — Other Ambulatory Visit: Payer: Self-pay

## 2021-11-27 ENCOUNTER — Other Ambulatory Visit (HOSPITAL_COMMUNITY): Payer: Self-pay

## 2021-11-27 VITALS — BP 99/62 | HR 79 | Temp 97.8°F | Wt 110.0 lb

## 2021-11-27 DIAGNOSIS — Z113 Encounter for screening for infections with a predominantly sexual mode of transmission: Secondary | ICD-10-CM | POA: Diagnosis present

## 2021-11-27 DIAGNOSIS — B20 Human immunodeficiency virus [HIV] disease: Secondary | ICD-10-CM

## 2021-11-27 DIAGNOSIS — Z79899 Other long term (current) drug therapy: Secondary | ICD-10-CM | POA: Diagnosis not present

## 2021-11-27 DIAGNOSIS — R636 Underweight: Secondary | ICD-10-CM

## 2021-11-27 MED ORDER — BIKTARVY 50-200-25 MG PO TABS
1.0000 | ORAL_TABLET | Freq: Every day | ORAL | 11 refills | Status: DC
  Filled 2021-11-27: qty 30, 30d supply, fill #0

## 2021-11-27 MED ORDER — DRONABINOL 5 MG PO CAPS
5.0000 mg | ORAL_CAPSULE | Freq: Two times a day (BID) | ORAL | 5 refills | Status: DC
  Filled 2021-11-27: qty 60, 30d supply, fill #0

## 2021-11-27 NOTE — Progress Notes (Signed)
   Subjective:    Patient ID: [name], female    DOB: June 04, 1985, 36 y.o.   MRN: [id number]  HPI Here for follow up of HIV She continues on Biktarvy and had one missed doses when her pharmacy ran out of  the medication.  She is otherwise doing well.  No labs prior to the visit.  She still has issues with gaining weight.  She had menstrual irregularieties with megace and did not pick up marinol yet.     Review of Systems  Constitutional:  Negative for fatigue.  Gastrointestinal:  Negative for diarrhea and nausea.       Objective:   Physical Exam Eyes:     General: No scleral icterus. Pulmonary:     Effort: Pulmonary effort is normal.  Neurological:     General: No focal deficit present.     Mental Status: She is alert.  Psychiatric:        Mood and Affect: Mood normal.   SH: + tobacco        Assessment & Plan:

## 2021-11-27 NOTE — Assessment & Plan Note (Signed)
Will try marinol.  She did not pick up last time but wants to try.

## 2021-11-27 NOTE — Assessment & Plan Note (Signed)
Will screen 

## 2021-11-27 NOTE — Assessment & Plan Note (Signed)
She continues to do well and doubt the one missed day is an issue.  Will check her labs to confirm and otherwise she can return in 6 months.

## 2021-11-28 ENCOUNTER — Other Ambulatory Visit: Payer: Self-pay | Admitting: Hematology

## 2021-11-28 ENCOUNTER — Other Ambulatory Visit (HOSPITAL_COMMUNITY): Payer: Self-pay

## 2021-11-28 DIAGNOSIS — B20 Human immunodeficiency virus [HIV] disease: Secondary | ICD-10-CM

## 2021-11-28 LAB — T-HELPER CELL (CD4) - (RCID CLINIC ONLY)
CD4 % Helper T Cell: 50 % (ref 33–65)
CD4 T Cell Abs: 614 /uL (ref 400–1790)

## 2021-11-28 LAB — URINE CYTOLOGY ANCILLARY ONLY
Chlamydia: NEGATIVE
Comment: NEGATIVE
Comment: NORMAL
Neisseria Gonorrhea: NEGATIVE

## 2021-11-28 MED ORDER — TRAMADOL HCL 50 MG PO TABS: 100.0000 mg | ORAL_TABLET | Freq: Four times a day (QID) | ORAL | 0 refills | Status: DC | PRN

## 2021-12-01 LAB — LIPID PANEL
Cholesterol: 188 mg/dL (ref <200)
HDL: 55 mg/dL (ref >=50)
LDL Cholesterol (Calc): 102 mg/dL (calc) — ABNORMAL HIGH
Non-HDL Cholesterol (Calc): 133 mg/dL (calc) — ABNORMAL HIGH (ref <130)
Total CHOL/HDL Ratio: 3.4 (calc) (ref <5.0)
Triglycerides: 194 mg/dL — ABNORMAL HIGH (ref <150)

## 2021-12-01 LAB — HIV-1 RNA QUANT-NO REFLEX-BLD
HIV 1 RNA Quant: <20 Copies/mL — ABNORMAL HIGH
HIV-1 RNA Quant, Log: <1.3 Log cps/mL — ABNORMAL HIGH

## 2021-12-01 LAB — RPR: RPR Ser Ql: NONREACTIVE

## 2021-12-09 ENCOUNTER — Other Ambulatory Visit: Payer: Self-pay | Admitting: Hematology

## 2021-12-09 ENCOUNTER — Telehealth: Payer: Self-pay

## 2021-12-09 DIAGNOSIS — B20 Human immunodeficiency virus [HIV] disease: Secondary | ICD-10-CM

## 2021-12-09 MED ORDER — TRAMADOL HCL 50 MG PO TABS: 100.0000 mg | ORAL_TABLET | Freq: Four times a day (QID) | ORAL | 0 refills | Status: DC | PRN

## 2021-12-09 NOTE — Telephone Encounter (Signed)
Called and left a message Rx sent to her pharmacy. Ask her to call the office for questions.

## 2021-12-09 NOTE — Telephone Encounter (Signed)
She called and left a message requesting Tramadol Rx refill.  Sent a message to Dr. Burr Medico requesting refill.

## 2021-12-17 ENCOUNTER — Other Ambulatory Visit (HOSPITAL_COMMUNITY): Payer: Self-pay

## 2021-12-19 ENCOUNTER — Telehealth: Payer: Self-pay | Admitting: *Deleted

## 2021-12-19 ENCOUNTER — Other Ambulatory Visit: Payer: Self-pay | Admitting: Hematology and Oncology

## 2021-12-19 DIAGNOSIS — B20 Human immunodeficiency virus [HIV] disease: Secondary | ICD-10-CM

## 2021-12-19 MED ORDER — TRAMADOL HCL 50 MG PO TABS: 100.0000 mg | ORAL_TABLET | Freq: Four times a day (QID) | ORAL | 0 refills | Status: DC | PRN

## 2021-12-19 NOTE — Telephone Encounter (Signed)
Pt called to request refill on tramadol.  Last filled 12/09/2021 # 90 sig of 2 tabs po q 6 hours prn   Pharmacy [facility] on [address]

## 2021-12-19 NOTE — Telephone Encounter (Signed)
done

## 2021-12-31 ENCOUNTER — Other Ambulatory Visit: Payer: Self-pay | Admitting: Hematology

## 2021-12-31 ENCOUNTER — Telehealth: Payer: Self-pay

## 2021-12-31 DIAGNOSIS — B20 Human immunodeficiency virus [HIV] disease: Secondary | ICD-10-CM

## 2021-12-31 MED ORDER — TRAMADOL HCL 50 MG PO TABS: 100.0000 mg | ORAL_TABLET | Freq: Four times a day (QID) | ORAL | 0 refills | Status: DC | PRN

## 2021-12-31 NOTE — Telephone Encounter (Signed)
Called and told her Rx sent. She verbalized understanding.

## 2021-12-31 NOTE — Telephone Encounter (Signed)
She called and left a message requesting Tramadol refill to previous pharmacy.

## 2022-01-09 ENCOUNTER — Other Ambulatory Visit: Payer: Self-pay | Admitting: Hematology and Oncology

## 2022-01-09 ENCOUNTER — Telehealth: Payer: Self-pay

## 2022-01-09 DIAGNOSIS — B20 Human immunodeficiency virus [HIV] disease: Secondary | ICD-10-CM

## 2022-01-09 MED ORDER — TRAMADOL HCL 50 MG PO TABS: 100.0000 mg | ORAL_TABLET | Freq: Four times a day (QID) | ORAL | 0 refills | Status: DC | PRN

## 2022-01-09 NOTE — Telephone Encounter (Signed)
She called and ask for tramadol refill.

## 2022-01-14 ENCOUNTER — Other Ambulatory Visit (HOSPITAL_COMMUNITY): Payer: Self-pay

## 2022-01-22 ENCOUNTER — Other Ambulatory Visit (HOSPITAL_COMMUNITY): Payer: Self-pay

## 2022-01-23 ENCOUNTER — Telehealth: Payer: Self-pay

## 2022-01-23 ENCOUNTER — Other Ambulatory Visit: Payer: Self-pay | Admitting: Hematology and Oncology

## 2022-01-23 DIAGNOSIS — B20 Human immunodeficiency virus [HIV] disease: Secondary | ICD-10-CM

## 2022-01-23 MED ORDER — TRAMADOL HCL 50 MG PO TABS: 100.0000 mg | ORAL_TABLET | Freq: Four times a day (QID) | ORAL | 0 refills | Status: DC | PRN

## 2022-01-23 NOTE — Telephone Encounter (Signed)
She called and left a message requesting a Tramadol refill.

## 2022-01-23 NOTE — Telephone Encounter (Signed)
Called and told Rx sent.

## 2022-01-23 NOTE — Telephone Encounter (Signed)
[facility]One

## 2022-01-30 ENCOUNTER — Other Ambulatory Visit (HOSPITAL_COMMUNITY): Payer: Self-pay

## 2022-02-04 ENCOUNTER — Other Ambulatory Visit: Payer: Self-pay | Admitting: Hematology and Oncology

## 2022-02-04 ENCOUNTER — Telehealth: Payer: Self-pay

## 2022-02-04 DIAGNOSIS — B20 Human immunodeficiency virus [HIV] disease: Secondary | ICD-10-CM

## 2022-02-04 MED ORDER — TRAMADOL HCL 50 MG PO TABS: 100.0000 mg | ORAL_TABLET | Freq: Four times a day (QID) | ORAL | 0 refills | Status: DC | PRN

## 2022-02-04 NOTE — Telephone Encounter (Signed)
Called and left below message. Ask her to call the office for questions. ?

## 2022-02-04 NOTE — Telephone Encounter (Signed)
done

## 2022-02-04 NOTE — Telephone Encounter (Signed)
She called and left a message requesting Tramadol Rx to [facility].

## 2022-02-13 ENCOUNTER — Ambulatory Visit (HOSPITAL_COMMUNITY)
Admission: EM | Admit: 2022-02-13 | Discharge: 2022-02-13 | Disposition: A | Discharge status: Home / Self Care | Payer: 59

## 2022-02-13 ENCOUNTER — Encounter (HOSPITAL_COMMUNITY): Payer: Self-pay | Admitting: Emergency Medicine

## 2022-02-13 ENCOUNTER — Telehealth: Payer: Self-pay

## 2022-02-13 ENCOUNTER — Other Ambulatory Visit: Payer: Self-pay | Admitting: Hematology and Oncology

## 2022-02-13 DIAGNOSIS — M25511 Pain in right shoulder: Secondary | ICD-10-CM | POA: Diagnosis not present

## 2022-02-13 DIAGNOSIS — B20 Human immunodeficiency virus [HIV] disease: Secondary | ICD-10-CM

## 2022-02-13 MED ORDER — TRAMADOL HCL 50 MG PO TABS: 100.0000 mg | ORAL_TABLET | Freq: Four times a day (QID) | ORAL | 0 refills | Status: DC | PRN

## 2022-02-13 NOTE — Discharge Instructions (Signed)
It appears that you are having shoulder pain.  Please take tramadol as needed that you have been prescribed by other healthcare provider.  Alternate ice and heat to affected area.  Follow-up if symptoms persist or worsen.

## 2022-02-13 NOTE — Telephone Encounter (Signed)
done
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
## 2022-02-13 NOTE — Telephone Encounter (Signed)
Called and told Rx sent. She verbalized understanding.  

## 2022-02-13 NOTE — ED Triage Notes (Signed)
Pt reports right chest pains that radiates to right arm that started yesterday afternoon when got off work. Does lifting at work.

## 2022-02-13 NOTE — Telephone Encounter (Signed)
She called and left a message requesting Tramadol refill to pharmacy. 

## 2022-02-19 ENCOUNTER — Emergency Department (HOSPITAL_COMMUNITY)
Admission: EM | Admit: 2022-02-19 | Discharge: 2022-02-19 | Disposition: A | Discharge status: Home / Self Care | Payer: 59 | Attending: Emergency Medicine | Admitting: Emergency Medicine

## 2022-02-19 ENCOUNTER — Other Ambulatory Visit: Payer: Self-pay

## 2022-02-19 ENCOUNTER — Encounter (HOSPITAL_COMMUNITY): Payer: Self-pay | Admitting: Emergency Medicine

## 2022-02-19 ENCOUNTER — Emergency Department (HOSPITAL_COMMUNITY): Payer: 59

## 2022-02-19 DIAGNOSIS — R072 Precordial pain: Secondary | ICD-10-CM | POA: Insufficient documentation

## 2022-02-19 DIAGNOSIS — Z21 Asymptomatic human immunodeficiency virus [HIV] infection status: Secondary | ICD-10-CM | POA: Diagnosis not present

## 2022-02-19 DIAGNOSIS — R051 Acute cough: Secondary | ICD-10-CM | POA: Insufficient documentation

## 2022-02-19 DIAGNOSIS — Z1152 Encounter for screening for COVID-19: Secondary | ICD-10-CM | POA: Insufficient documentation

## 2022-02-19 DIAGNOSIS — R059 Cough, unspecified: Secondary | ICD-10-CM | POA: Diagnosis present

## 2022-02-19 LAB — BASIC METABOLIC PANEL
Anion gap: 7 (ref 5–15)
BUN: 11 mg/dL (ref 6–20)
CO2: 25 mmol/L (ref 22–32)
Calcium: 8.9 mg/dL (ref 8.9–10.3)
Chloride: 106 mmol/L (ref 98–111)
Creatinine, Ser: 1.01 mg/dL — ABNORMAL HIGH (ref 0.44–1.00)
GFR, Estimated: >60 mL/min (ref >60)
Glucose, Bld: 101 mg/dL — ABNORMAL HIGH (ref 70–99)
Potassium: 3.6 mmol/L (ref 3.5–5.1)
Sodium: 138 mmol/L (ref 135–145)

## 2022-02-19 LAB — CBC
HCT: 38.5 % (ref 36.0–46.0)
Hemoglobin: 13.3 g/dL (ref 12.0–15.0)
MCH: 33.7 pg (ref 26.0–34.0)
MCHC: 34.5 g/dL (ref 30.0–36.0)
MCV: 97.5 fL (ref 80.0–100.0)
Platelets: 275 10*3/uL (ref 150–400)
RBC: 3.95 MIL/uL (ref 3.87–5.11)
RDW: 12.8 % (ref 11.5–15.5)
WBC: 6.9 10*3/uL (ref 4.0–10.5)
nRBC: 0 % (ref 0.0–0.2)

## 2022-02-19 LAB — RESP PANEL BY RT-PCR (FLU A&B, COVID) ARPGX2
Influenza A by PCR: NEGATIVE
Influenza B by PCR: NEGATIVE
SARS Coronavirus 2 by RT PCR: NEGATIVE

## 2022-02-19 LAB — I-STAT BETA HCG BLOOD, ED (MC, WL, AP ONLY): I-stat hCG, quantitative: <5 m[IU]/mL (ref <5)

## 2022-02-19 LAB — TROPONIN I (HIGH SENSITIVITY): Troponin I (High Sensitivity): <2 ng/L (ref <18)

## 2022-02-19 MED ORDER — METHOCARBAMOL 500 MG PO TABS: 500.0000 mg | ORAL_TABLET | Freq: Two times a day (BID) | ORAL | 0 refills | Status: DC

## 2022-02-19 MED ORDER — BENZONATATE 100 MG PO CAPS: 100.0000 mg | ORAL_CAPSULE | Freq: Three times a day (TID) | ORAL | 0 refills | Status: DC

## 2022-02-19 NOTE — ED Triage Notes (Signed)
Pt reports chest pain & cough that began today at work. Pt reports left sided chest pain that feels "sore." Denies any radiation.

## 2022-02-19 NOTE — ED Provider Notes (Signed)
[facility] DEPT Provider Note   CSN: [id number] Arrival date & time: 02/19/22  1519     History  Chief Complaint  Patient presents with   Cough   Chest Pain    [name] is a 36 y.o. female.  Patient with history of HIV compliant with antiviral regimen and normal CD4 count in 08/23, Hodgkin's lymphoma in remission, presents today with complaints of chest pain and cough. She states that yesterday she moved several large dressers which is not normal for her and when she woke up this morning she felt soreness in her chest which she suspected was from heavy lifting. Pain is reproducible to palpation and is worse with bilateral arm movements. States that around noon at work today she started coughing which made her chest pain worse. Cough is not productive. Endorses feeling cold but no fevers. No shortness of breath, nausea, or vomiting. No cardiac history. Denies any leg pain or leg swelling. No recent travel or recent surgeries, she is not on OCPs.   The history is provided by the patient. No language interpreter was used.  Cough Associated symptoms: chest pain   Chest Pain Associated symptoms: cough        Home Medications Prior to Admission medications   Medication Sig Start Date End Date Taking? Authorizing Provider  bictegravir-emtricitabine-tenofovir AF (BIKTARVY) 50-200-25 MG TABS tablet Take 1 tablet by mouth daily. 11/27/21   Thayer Headings, MD  cholecalciferol (VITAMIN D) 1000 units tablet Take 1 tablet (1,000 Units total) by mouth daily. 06/05/16   Heath Lark, MD  dronabinol (MARINOL) 5 MG capsule Take 1 capsule (5 mg total) by mouth 2 (two) times daily before a meal. 11/27/21   Comer, Okey Regal, MD  hydrOXYzine (ATARAX) 10 MG tablet TAKE 1 TABLET(10 MG) BY MOUTH THREE TIMES DAILY AS NEEDED 10/30/21   Heath Lark, MD  ondansetron (ZOFRAN) 4 MG tablet Take 1 tablet (4 mg total) by mouth every 6 (six) hours. 04/27/21   Couture, Cortni S, PA-C   traMADol (ULTRAM) 50 MG tablet Take 2 tablets (100 mg total) by mouth every 6 (six) hours as needed. 02/13/22   Heath Lark, MD      Allergies    Temazepam    Review of Systems   Review of Systems  Respiratory:  Positive for cough.   Cardiovascular:  Positive for chest pain.  All other systems reviewed and are negative.   Physical Exam Updated Vital Signs BP 113/81   Pulse 78   Temp 98.5 F (36.9 C) (Oral)   Resp 19   Ht '5\' 4"'$  (1.626 m)   Wt 49.9 kg   LMP 01/29/2022   SpO2 100%   BMI 18.88 kg/m  Physical Exam Vitals and nursing note reviewed.  Constitutional:      General: She is not in acute distress.    Appearance: Normal appearance. She is normal weight. She is not ill-appearing, toxic-appearing or diaphoretic.  HENT:     Head: Normocephalic and atraumatic.  Cardiovascular:     Rate and Rhythm: Normal rate and regular rhythm.     Pulses:          Radial pulses are 2+ on the right side and 2+ on the left side.     Heart sounds: Normal heart sounds.  Pulmonary:     Effort: Pulmonary effort is normal. No respiratory distress.     Breath sounds: Normal breath sounds.  Chest:     Chest wall: Tenderness  present.     Comments: Tenderness noted to palpation throughout upper chest and arm musculature. No crepitus, bruising, or deformity. Abdominal:     Palpations: Abdomen is soft.     Tenderness: There is no abdominal tenderness.  Musculoskeletal:        General: Normal range of motion.     Cervical back: Normal range of motion.     Right lower leg: No tenderness. No edema.     Left lower leg: No tenderness. No edema.  Skin:    General: Skin is warm and dry.  Neurological:     General: No focal deficit present.     Mental Status: She is alert.  Psychiatric:        Mood and Affect: Mood normal.        Behavior: Behavior normal.     ED Results / Procedures / Treatments   Labs (all labs ordered are listed, but only abnormal results are displayed) Labs  Reviewed  BASIC METABOLIC PANEL - Abnormal; Notable for the following components:      Result Value   Glucose, Bld 101 (*)    Creatinine, Ser 1.01 (*)    All other components within normal limits  RESP PANEL BY RT-PCR (FLU A&B, COVID) ARPGX2  CBC  I-STAT BETA HCG BLOOD, ED ([facility], [facility], [facility] ONLY)  TROPONIN I (HIGH SENSITIVITY)    EKG None  Radiology DG Chest 2 View  Result Date: 02/19/2022 CLINICAL DATA:  Chest pain, cough EXAM: CHEST - 2 VIEW COMPARISON:  Chest radiograph done on 10/03/2019 and CT chest done on 12/05/2019 FINDINGS: Cardiac size is within normal limits. There are no signs of pulmonary edema or focal pulmonary infiltrates. There is no pleural effusion or pneumothorax. Metallic necklace are partly obscuring both apices. Metallic densities in the broad are superimposed over the lower lung fields. IMPRESSION: No active cardiopulmonary disease. Electronically Signed   By: Elmer Picker M.D.   On: 02/19/2022 16:08    Procedures Procedures    Medications Ordered in ED Medications - No data to display  ED Course/ Medical Decision Making/ A&P                           Medical Decision Making Amount and/or Complexity of Data Reviewed Labs: ordered. Radiology: ordered.   This patient is a 36 y.o. female  who presents to the ED for concern of chest pain and cough.   Differential diagnoses prior to evaluation: The emergent differential diagnosis includes, but is not limited to,  ACS, pericarditis, aortic dissection, PE, pneumothorax, esophageal spasm or rupture, chronic angina, valvular disease, cardiomyopathy, myocarditis, pulmonary HTN, pneumonia, bronchitis, GERD, costochondritis, URI, lung cancer, anxiety or panic attack    This is not an exhaustive differential.   Past Medical History / Co-morbidities: Hx hodgkins lymphoma in remission, HIV on antivirals with normal CD4 count.  Additional history: Chart reviewed. Pertinent results include: Patient had normal  CD4 count at her visit on 8/23.  Physical Exam: Physical exam performed. The pertinent findings include: Chest with tenderness to palpation over chest wall musculature.  Lab Tests/Imaging studies: I personally interpreted labs/imaging and the pertinent results include: No acute laboratory findings.  Chest x-ray NAD. I agree with the radiologist interpretation.  Cardiac monitoring: EKG obtained and interpreted by my attending physician which shows: Sinus rhythm   Disposition: After consideration of the diagnostic results and the patients response to treatment, I feel that emergency department workup does not  suggest an emergent condition requiring admission or immediate intervention beyond what has been performed at this time.  After evaluating all of the data points in this case, the presentation of [name] is NOT consistent with Acute Coronary Syndrome (ACS) and/or myocardial ischemia, pulmonary embolism (PERC negative), aortic dissection; Borhaave's, significant arrythmia, pneumothorax, cardiac tamponade, or other emergent cardiopulmonary condition.  Further, the presentation of [name] is NOT consistent with pericarditis, myocarditis, mediastinitis, endocarditis, or new valvular disease.  Additionally, the presentation of [name] NOT consistent with flail chest, cardiac contusion, ARDS, or significant intra-thoracic bleeding.  Moreover, this presentation is NOT consistent with pneumonia or sepsis  The patient has a   heart score 1, low risk. Symptoms more consistent with muscular soreness from overuse yesterday. May also have URI due to cough. Given reassuring work-up, patient is stable for discharge. Given tessalon and robaxin for symptomatic relief.  Patient is understanding and amenable with plan.  Emphasized importance of close PCP follow-up.  Also educated on red flag symptoms that would prompt immediate return.  Patient discharged in stable condition.   Strict  return and follow-up precautions have been given by me personally or by detailed written instruction given verbally by nursing staff using the teach back method to the patient/family/caregiver(s).  Data Reviewed/Counseling: I have reviewed the patient's vital signs, nursing notes, and other relevant tests/information. I had a detailed discussion regarding the historical points, exam findings, and any diagnostic results supporting the discharge diagnosis. I also discussed the need for outpatient follow-up and the need to return to the ED if symptoms worsen or if there are any questions or concerns that arise at home.   Final Clinical Impression(s) / ED Diagnoses Final diagnoses:  Acute cough  Precordial chest pain    Rx / DC Orders ED Discharge Orders          Ordered    benzonatate (TESSALON) 100 MG capsule  Every 8 hours        02/19/22 1813    methocarbamol (ROBAXIN) 500 MG tablet  2 times daily        02/19/22 1813          An After Visit Summary was printed and given to the patient.     Nestor Lewandowsky 02/19/22 1814    Ezequiel Essex, MD 02/20/22 [phone]

## 2022-02-19 NOTE — ED Provider Triage Note (Signed)
Emergency Medicine Provider Triage Evaluation Note  [name] , a 36 y.o. female  was evaluated in triage.  Pt complains of chest pain and cough. She states that yesterday she moved several large dressers which is not normal for her and when she woke up this morning she felt soreness in her chest which she suspected was from heavy lifting. States that around noon at work today she started coughing which made her chest pain worse. Cough is not productive. Endorses feeling cold but no fevers. No shortness of breath, nausea, or vomiting. No cardiac history.  Review of Systems  Positive:  Negative:   Physical Exam  BP 113/81   Pulse 78   Temp 98.5 F (36.9 C) (Oral)   Resp 19   Ht '5\' 4"'$  (1.626 m)   Wt 49.9 kg   LMP 01/29/2022   SpO2 100%   BMI 18.88 kg/m  Gen:   Awake, no distress   Resp:  Normal effort  MSK:   Moves extremities without difficulty  Other:    Medical Decision Making  Medically screening exam initiated at 3:32 PM.  Appropriate orders placed.  [name] was informed that the remainder of the evaluation will be completed by another provider, this initial triage assessment does not replace that evaluation, and the importance of remaining in the ED until their evaluation is complete.     Bud Face, PA-C 02/19/22 1534

## 2022-02-19 NOTE — Discharge Instructions (Signed)
As we discussed, your work-up in the ER today was reassuring for acute findings.  Laboratory evaluation, x-ray, and EKG did not reveal any emergent concerns.  I have given you prescription for a muscle relaxer and a cough suppressant medication for you to take as prescribed as needed for management of your symptoms.  Please do not drive or operate heavy machinery after taking Robaxin as it can be sedating.  Return if development of any new or worsening symptoms.

## 2022-02-26 ENCOUNTER — Other Ambulatory Visit: Payer: Self-pay | Admitting: Hematology and Oncology

## 2022-02-26 ENCOUNTER — Telehealth: Payer: Self-pay

## 2022-02-26 DIAGNOSIS — B20 Human immunodeficiency virus [HIV] disease: Secondary | ICD-10-CM

## 2022-02-26 MED ORDER — TRAMADOL HCL 50 MG PO TABS: 100.0000 mg | ORAL_TABLET | Freq: Four times a day (QID) | ORAL | 0 refills | Status: DC | PRN

## 2022-02-26 NOTE — Telephone Encounter (Signed)
Called and left a message Rs sent to pharmacy. Ask her to call the office for questions.

## 2022-02-26 NOTE — Telephone Encounter (Signed)
She called and left a message requesting Tramadol refill to pharmacy. 

## 2022-03-10 ENCOUNTER — Telehealth: Payer: Self-pay

## 2022-03-10 ENCOUNTER — Other Ambulatory Visit: Payer: Self-pay | Admitting: Hematology and Oncology

## 2022-03-10 DIAGNOSIS — B20 Human immunodeficiency virus [HIV] disease: Secondary | ICD-10-CM

## 2022-03-10 MED ORDER — TRAMADOL HCL 50 MG PO TABS: 100.0000 mg | ORAL_TABLET | Freq: Four times a day (QID) | ORAL | 0 refills | Status: DC | PRN

## 2022-03-10 NOTE — Telephone Encounter (Signed)
Called and told Rx sent. She verbalized understanding.  

## 2022-03-10 NOTE — Telephone Encounter (Signed)
She called and left a message requesting Tramadol refill to [facility].

## 2022-03-10 NOTE — Telephone Encounter (Signed)
Done
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
## 2022-03-12 ENCOUNTER — Other Ambulatory Visit: Payer: Self-pay | Admitting: Pharmacist

## 2022-03-12 ENCOUNTER — Ambulatory Visit (INDEPENDENT_AMBULATORY_CARE_PROVIDER_SITE_OTHER): Payer: 59 | Admitting: Pharmacist

## 2022-03-12 ENCOUNTER — Other Ambulatory Visit (HOSPITAL_COMMUNITY)
Admission: RE | Admit: 2022-03-12 | Discharge: 2022-03-12 | Disposition: A | Discharge status: Home / Self Care | Payer: 59 | Source: Ambulatory Visit | Attending: Internal Medicine | Admitting: Internal Medicine

## 2022-03-12 ENCOUNTER — Other Ambulatory Visit: Payer: Self-pay

## 2022-03-12 DIAGNOSIS — Z113 Encounter for screening for infections with a predominantly sexual mode of transmission: Secondary | ICD-10-CM

## 2022-03-12 DIAGNOSIS — Z1159 Encounter for screening for other viral diseases: Secondary | ICD-10-CM | POA: Diagnosis not present

## 2022-03-12 DIAGNOSIS — B3731 Acute candidiasis of vulva and vagina: Secondary | ICD-10-CM

## 2022-03-12 MED ORDER — FLUCONAZOLE 150 MG PO TABS: 150.0000 mg | ORAL_TABLET | Freq: Once | ORAL | 0 refills | Status: AC

## 2022-03-13 LAB — HEPATITIS B SURFACE ANTIBODY,QUALITATIVE: Hep B S Ab: NONREACTIVE

## 2022-03-13 LAB — RPR: RPR Ser Ql: NONREACTIVE

## 2022-03-15 ENCOUNTER — Other Ambulatory Visit: Payer: Self-pay | Admitting: Internal Medicine

## 2022-03-15 DIAGNOSIS — B20 Human immunodeficiency virus [HIV] disease: Secondary | ICD-10-CM

## 2022-03-16 LAB — URINE CYTOLOGY ANCILLARY ONLY
Candida Urine: NEGATIVE
Candida Urine: NEGATIVE
Chlamydia: NEGATIVE
Comment: NEGATIVE
Comment: NEGATIVE
Comment: NORMAL
Neisseria Gonorrhea: NEGATIVE
Trichomonas: NEGATIVE

## 2022-03-20 ENCOUNTER — Other Ambulatory Visit: Payer: Self-pay | Admitting: Oncology

## 2022-03-20 ENCOUNTER — Telehealth: Payer: Self-pay

## 2022-03-20 DIAGNOSIS — B20 Human immunodeficiency virus [HIV] disease: Secondary | ICD-10-CM

## 2022-03-20 MED ORDER — TRAMADOL HCL 50 MG PO TABS: 100.0000 mg | ORAL_TABLET | Freq: Four times a day (QID) | ORAL | 0 refills | Status: DC | PRN

## 2022-03-20 NOTE — Telephone Encounter (Signed)
Returned her call regarding request for Tramadol refill. Told her Rx sent. She verbalized understanding.

## 2022-03-31 ENCOUNTER — Telehealth: Payer: Self-pay

## 2022-03-31 ENCOUNTER — Other Ambulatory Visit: Payer: Self-pay | Admitting: Hematology and Oncology

## 2022-03-31 DIAGNOSIS — B20 Human immunodeficiency virus [HIV] disease: Secondary | ICD-10-CM

## 2022-03-31 MED ORDER — TRAMADOL HCL 50 MG PO TABS: 100.0000 mg | ORAL_TABLET | Freq: Four times a day (QID) | ORAL | 0 refills | Status: DC | PRN

## 2022-03-31 NOTE — Telephone Encounter (Signed)
Called and told Rx sent. She verbalized understanding.  

## 2022-03-31 NOTE — Telephone Encounter (Signed)
She called and left a message requesting a refill on Tramadol to pharmacy.

## 2022-03-31 NOTE — Telephone Encounter (Signed)
done

## 2022-04-04 ENCOUNTER — Encounter (HOSPITAL_COMMUNITY): Payer: Self-pay

## 2022-04-04 ENCOUNTER — Emergency Department (HOSPITAL_COMMUNITY): Payer: 59

## 2022-04-04 ENCOUNTER — Emergency Department (HOSPITAL_COMMUNITY)
Admission: EM | Admit: 2022-04-04 | Discharge: 2022-04-05 | Discharge status: Against Medical Advice | Payer: 59 | Attending: Emergency Medicine | Admitting: Emergency Medicine

## 2022-04-04 DIAGNOSIS — R6883 Chills (without fever): Secondary | ICD-10-CM | POA: Insufficient documentation

## 2022-04-04 DIAGNOSIS — Z1152 Encounter for screening for COVID-19: Secondary | ICD-10-CM | POA: Diagnosis not present

## 2022-04-04 DIAGNOSIS — Z5321 Procedure and treatment not carried out due to patient leaving prior to being seen by health care provider: Secondary | ICD-10-CM | POA: Diagnosis not present

## 2022-04-04 DIAGNOSIS — J069 Acute upper respiratory infection, unspecified: Secondary | ICD-10-CM | POA: Insufficient documentation

## 2022-04-04 DIAGNOSIS — R059 Cough, unspecified: Secondary | ICD-10-CM | POA: Diagnosis present

## 2022-04-04 DIAGNOSIS — R109 Unspecified abdominal pain: Secondary | ICD-10-CM | POA: Diagnosis not present

## 2022-04-04 DIAGNOSIS — R0602 Shortness of breath: Secondary | ICD-10-CM | POA: Diagnosis not present

## 2022-04-04 DIAGNOSIS — R091 Pleurisy: Secondary | ICD-10-CM | POA: Insufficient documentation

## 2022-04-04 DIAGNOSIS — Z21 Asymptomatic human immunodeficiency virus [HIV] infection status: Secondary | ICD-10-CM | POA: Insufficient documentation

## 2022-04-04 LAB — CBC WITH DIFFERENTIAL/PLATELET
Abs Immature Granulocytes: 0.02 10*3/uL (ref 0.00–0.07)
Basophils Absolute: 0 10*3/uL (ref 0.0–0.1)
Basophils Relative: 0 %
Eosinophils Absolute: 0.4 10*3/uL (ref 0.0–0.5)
Eosinophils Relative: 5 %
HCT: 42.2 % (ref 36.0–46.0)
Hemoglobin: 14.2 g/dL (ref 12.0–15.0)
Immature Granulocytes: 0 %
Lymphocytes Relative: 19 %
Lymphs Abs: 1.2 10*3/uL (ref 0.7–4.0)
MCH: 33.5 pg (ref 26.0–34.0)
MCHC: 33.6 g/dL (ref 30.0–36.0)
MCV: 99.5 fL (ref 80.0–100.0)
Monocytes Absolute: 0.8 10*3/uL (ref 0.1–1.0)
Monocytes Relative: 12 %
Neutro Abs: 4.3 10*3/uL (ref 1.7–7.7)
Neutrophils Relative %: 64 %
Platelets: 286 10*3/uL (ref 150–400)
RBC: 4.24 MIL/uL (ref 3.87–5.11)
RDW: 12.8 % (ref 11.5–15.5)
WBC: 6.7 10*3/uL (ref 4.0–10.5)
nRBC: 0 % (ref 0.0–0.2)

## 2022-04-04 LAB — BASIC METABOLIC PANEL
Anion gap: 8 (ref 5–15)
BUN: 11 mg/dL (ref 6–20)
CO2: 28 mmol/L (ref 22–32)
Calcium: 9.3 mg/dL (ref 8.9–10.3)
Chloride: 102 mmol/L (ref 98–111)
Creatinine, Ser: 1.05 mg/dL — ABNORMAL HIGH (ref 0.44–1.00)
GFR, Estimated: >60 mL/min (ref >60)
Glucose, Bld: 82 mg/dL (ref 70–99)
Potassium: 3.7 mmol/L (ref 3.5–5.1)
Sodium: 138 mmol/L (ref 135–145)

## 2022-04-04 LAB — RESP PANEL BY RT-PCR (RSV, FLU A&B, COVID)  RVPGX2
Influenza A by PCR: NEGATIVE
Influenza B by PCR: NEGATIVE
Resp Syncytial Virus by PCR: NEGATIVE
SARS Coronavirus 2 by RT PCR: NEGATIVE

## 2022-04-04 NOTE — ED Provider Triage Note (Signed)
Emergency Medicine Provider Triage Evaluation Note  [name] , a 36 y.o. female  was evaluated in triage.  Pt complains of cough and shortness of breath.  Patient states that the cough began on Thursday.  She also complains of some mild chills and abdominal pain which have coincided with the cough.  She also complains of some pleuritic chest pain with cough.  Patient is HIV positive and also has history of non-Hodgkin's lymphoma.  Denies fevers, nausea, vomiting  Review of Systems  Positive: As above Negative: As above  Physical Exam  BP 110/76 (BP Location: Right Arm)   Pulse 65   Temp 98.9 F (37.2 C) (Oral)   Resp 18   LMP 03/26/2022 (Approximate)   SpO2 97%  Gen:   Awake, no distress   Resp:  Normal effort  MSK:   Moves extremities without difficulty  Other:    Medical Decision Making  Medically screening exam initiated at 4:29 PM.  Appropriate orders placed.  [name] was informed that the remainder of the evaluation will be completed by another provider, this initial triage assessment does not replace that evaluation, and the importance of remaining in the ED until their evaluation is complete.     Dorothyann Peng, PA-C 04/04/22 1630

## 2022-04-04 NOTE — ED Triage Notes (Signed)
Pt presents with c/o cough and chills for 2 days. Pt reports some pain in her chest from the coughing as well.

## 2022-04-05 ENCOUNTER — Other Ambulatory Visit: Payer: Self-pay

## 2022-04-05 ENCOUNTER — Emergency Department (HOSPITAL_COMMUNITY)
Admission: EM | Admit: 2022-04-05 | Discharge: 2022-04-05 | Disposition: A | Discharge status: Home / Self Care | Payer: 59 | Source: Home / Self Care | Attending: Emergency Medicine | Admitting: Emergency Medicine

## 2022-04-05 DIAGNOSIS — Z21 Asymptomatic human immunodeficiency virus [HIV] infection status: Secondary | ICD-10-CM | POA: Insufficient documentation

## 2022-04-05 DIAGNOSIS — J069 Acute upper respiratory infection, unspecified: Secondary | ICD-10-CM | POA: Insufficient documentation

## 2022-04-05 MED ORDER — ACETAMINOPHEN 500 MG PO TABS
1000.0000 mg | ORAL_TABLET | Freq: Once | ORAL | Status: AC
  Administered 2022-04-05: 1000 mg via ORAL
  Filled 2022-04-05: qty 2

## 2022-04-05 MED ORDER — DEXAMETHASONE 4 MG PO TABS
10.0000 mg | ORAL_TABLET | Freq: Once | ORAL | Status: AC
  Administered 2022-04-05: 10 mg via ORAL
  Filled 2022-04-05: qty 1

## 2022-04-05 NOTE — Discharge Instructions (Addendum)
You were seen today for fever/cough/congestion. We did not identify any emergent cause for your symptoms. Your evaluation is most consistent with Viral URI.   Plan and next steps:   The following may be helpful in managing your symptoms:   Pain- Lidocaine Patches  Apply to affected area for up to 12 hours at a time.   Pain/Fever- Adult Tylenol dosing:  650 mg orally every 4 to 6 hours as needed, MAX: 3250 mg/24 hours   (Extra-strength) 1000 mg orally every 6 hours as needed; MAX: 3000 mg/24 hours   Do not use if you have liver disease. Read the label on the bottle.   Pain/Fever- Adult Ibuprofen Dosing  200 to 400 mg orally every 4 to 6 hours as needed; MAX 1200 mg/day; do not take longer than 10 days   Do not use if you have kidney disease. Read the label on the bottle   Findings:  You may see all of your lab and imaging results utilizing our online portal! Look in this document or ask a team member for your mychart* access information. The most notable results have additionally been verbally communicated with you and your bedside family.    Follow-up Plan:   Follow up with the patient's normal primary care provider for monitoring of this condition within 48 hours.   Signs/Symptoms that would warrant return to the ED:  Please return to the ED if you experience worsening of symptoms or any abrupt changes in your health. Standard of care precautions for your chief complaint have already been verbally communicated with you. Always be on alert for fevers, chills, shortness of breath, chest pains, or sudden changes that warrant immediate evaluation.    Thank you for allowing [place] to be a part of you and your families' care.   Tretha Sciara MD

## 2022-04-05 NOTE — ED Triage Notes (Signed)
Pt c/o cough and chills x 3 days. Pt was here yesterday but left before being seen.

## 2022-04-05 NOTE — ED Provider Notes (Signed)
[facility] DEPT Provider Note   CSN: [id number] Arrival date & time: 04/05/22  0741     History Chief Complaint  Patient presents with   Cough    HPI [name] is a 36 y.o. female presenting for fever cough and congestion over the past 2 days.  She endorses mild sore throat that began this morning.  She is otherwise ambulatory tolerating p.o. intake.  Patient with history of HIV but is compliant on Biktarvy.  History non-Hodgkin's lymphoma in remission.  She states that she felt fine on Thursday but began getting sick in the past 2 days.  No known sick contacts.  Patient is otherwise ambulatory tolerating p.o. intake..   Patient's recorded medical, surgical, social, medication list and allergies were reviewed in the Snapshot window as part of the initial history.   Review of Systems   Review of Systems  Constitutional:  Positive for fever. Negative for chills.  HENT:  Positive for congestion. Negative for dental problem, ear pain and sore throat.   Eyes:  Negative for pain and visual disturbance.  Respiratory:  Positive for cough. Negative for shortness of breath.   Cardiovascular:  Negative for chest pain and palpitations.  Gastrointestinal:  Negative for abdominal pain and vomiting.  Genitourinary:  Negative for dysuria and hematuria.  Musculoskeletal:  Negative for arthralgias and back pain.  Skin:  Negative for color change and rash.  Neurological:  Negative for seizures and syncope.  All other systems reviewed and are negative.   Physical Exam Updated Vital Signs BP 105/81 (BP Location: Right Arm)   Pulse 80   Temp 98.1 F (36.7 C) (Oral)   Resp 18   Ht '5\' 4"'$  (1.626 m)   Wt 50 kg   LMP 03/26/2022 (Approximate)   SpO2 99%   BMI 18.92 kg/m  Physical Exam Vitals and nursing note reviewed.  Constitutional:      General: She is not in acute distress.    Appearance: She is well-developed.  HENT:     Head: Normocephalic and  atraumatic.  Eyes:     Conjunctiva/sclera: Conjunctivae normal.  Cardiovascular:     Rate and Rhythm: Normal rate and regular rhythm.     Heart sounds: No murmur heard. Pulmonary:     Effort: Pulmonary effort is normal. No respiratory distress.     Breath sounds: Normal breath sounds.  Abdominal:     Palpations: Abdomen is soft.     Tenderness: There is no abdominal tenderness.  Musculoskeletal:        General: No swelling.     Cervical back: Neck supple.  Skin:    General: Skin is warm and dry.     Capillary Refill: Capillary refill takes less than 2 seconds.  Neurological:     Mental Status: She is alert.  Psychiatric:        Mood and Affect: Mood normal.      ED Course/ Medical Decision Making/ A&P    Procedures Procedures   Medications Ordered in ED Medications  dexamethasone (DECADRON) tablet 10 mg (has no administration in time range)  acetaminophen (TYLENOL) tablet 1,000 mg (has no administration in time range)   Medical Decision Making:   [name] is a 36 y.o. female who presented to the ED today with subjective fever, cough, congestion detailed above.    Patient's presentation is complicated by their history of multiple comorbid medical problems.  Patient placed on continuous vitals and telemetry monitoring while in ED  which was reviewed periodically.   Complete initial physical exam performed, notably the patient  was hemodynamically stable in no acute distress.  Posterior oropharynx illuminated and without obvious swelling or deformity.  Patient is without neck stiffness.    Reviewed and confirmed nursing documentation for past medical history, family history, social history.    Initial Assessment:   With the patient's presentation of fever cough congestion, most likely diagnosis is developing viral upper respiratory infection. Other diagnoses were considered including (but not limited to) peritonsillar abscess, retropharyngeal abscess, pneumonia. These  are considered less likely due to history of present illness and physical exam findings.   This is most consistent with an acute complicated illness Considered meningitis, however patient's symptoms, vital signs, physical exam findings including lack of meningismus seem grossly less consistent at this time. Initial Plan: Review of all labs from yesterday including below Screening labs including CBC and Metabolic panel to evaluate for infectious or metabolic etiology of disease.  Viral screening including COVID/flu testing to evaluate for common viral etiologies that need to be tracked CXR to evaluate for structural/infectious intrathoracic pathology.  Empiric treatment with antipyretics including acetaminophen in ambulatory setting As patient has sore throat, CENTOR Score dictates the following evaluation: no further testing Additionally will treat sore throat with dexamethasone dose Objective evaluation as below reviewed   Initial Study Results:   Laboratory  All laboratory results reviewed without evidence of clinically relevant pathology.     Radiology:  All images reviewed independently. Agree with radiology report at this time.   No orders to display       Final Assessment and Plan:   On reassessment, patient is ambulatory tolerating p.o. intake in no acute distress.   Patient's COVID test is negative. Patient is currently stable for outpatient care and management with no indication for hospitalization or transfer at this time.  Discussed all findings with patient expressed understanding.  Disposition:  Based on the above findings, I believe patient is stable for discharge.    Patient/family educated about specific return precautions for given chief complaint and symptoms.  Patient/family educated about follow-up with PCP.     Patient/family expressed understanding of return precautions and need for follow-up. Patient spoken to regarding all imaging and laboratory results and  appropriate follow up for these results. All education provided in verbal form with additional information in written form. Time was allowed for answering of patient questions. Patient discharged.    Emergency Department Medication Summary:   Medications  dexamethasone (DECADRON) tablet 10 mg (has no administration in time range)  acetaminophen (TYLENOL) tablet 1,000 mg (has no administration in time range)           Clinical Impression:  1. Upper respiratory tract infection, unspecified type      Data Unavailable   Clinical Impression:  1. Upper respiratory tract infection, unspecified type      Data Unavailable   Final Clinical Impression(s) / ED Diagnoses Final diagnoses:  Upper respiratory tract infection, unspecified type    Rx / DC Orders ED Discharge Orders     None         Tretha Sciara, MD 04/05/22 [phone]

## 2022-04-10 ENCOUNTER — Telehealth: Payer: Self-pay

## 2022-04-10 ENCOUNTER — Other Ambulatory Visit: Payer: Self-pay | Admitting: Hematology and Oncology

## 2022-04-10 DIAGNOSIS — B20 Human immunodeficiency virus [HIV] disease: Secondary | ICD-10-CM

## 2022-04-10 MED ORDER — TRAMADOL HCL 50 MG PO TABS: 100.0000 mg | ORAL_TABLET | Freq: Four times a day (QID) | ORAL | 0 refills | Status: DC | PRN

## 2022-04-10 NOTE — Telephone Encounter (Signed)
done

## 2022-04-10 NOTE — Telephone Encounter (Signed)
She called and left a message requesting refill on Tramadol Rx to [facility], please.

## 2022-04-10 NOTE — Telephone Encounter (Signed)
Called and told Rx sent. She verbalized understanding and appreciated the call.

## 2022-04-23 ENCOUNTER — Other Ambulatory Visit: Payer: Self-pay | Admitting: Nurse Practitioner

## 2022-04-23 ENCOUNTER — Telehealth: Payer: Self-pay

## 2022-04-23 DIAGNOSIS — B20 Human immunodeficiency virus [HIV] disease: Secondary | ICD-10-CM

## 2022-04-23 MED ORDER — TRAMADOL HCL 50 MG PO TABS: 100.0000 mg | ORAL_TABLET | Freq: Four times a day (QID) | ORAL | 0 refills | Status: DC | PRN

## 2022-04-23 NOTE — Telephone Encounter (Signed)
T/C from pt requesting a refill for her tramadol 50 mg.  Request was sent to the Dr on call Benay Spice) and refilled by Ned Card, NP.  LM for pt rx was sent to her pharmacy.

## 2022-05-05 ENCOUNTER — Other Ambulatory Visit: Payer: Self-pay | Admitting: Hematology and Oncology

## 2022-05-05 ENCOUNTER — Telehealth: Payer: Self-pay

## 2022-05-05 DIAGNOSIS — B20 Human immunodeficiency virus [HIV] disease: Secondary | ICD-10-CM

## 2022-05-05 MED ORDER — TRAMADOL HCL 50 MG PO TABS: 100.0000 mg | ORAL_TABLET | Freq: Four times a day (QID) | ORAL | 0 refills | Status: DC | PRN

## 2022-05-05 NOTE — Telephone Encounter (Signed)
Done

## 2022-05-05 NOTE — Telephone Encounter (Signed)
Called and told Rx sent to pharmacy. She verbalized understanding. 

## 2022-05-05 NOTE — Telephone Encounter (Signed)
She called and left a message requesting a Tramadol refill to [facility].

## 2022-05-06 ENCOUNTER — Other Ambulatory Visit: Payer: Self-pay

## 2022-05-06 DIAGNOSIS — C8118 Nodular sclerosis classical Hodgkin lymphoma, lymph nodes of multiple sites: Secondary | ICD-10-CM

## 2022-05-07 ENCOUNTER — Encounter: Payer: Self-pay | Admitting: Hematology and Oncology

## 2022-05-07 ENCOUNTER — Other Ambulatory Visit: Payer: Self-pay

## 2022-05-07 ENCOUNTER — Inpatient Hospital Stay: Payer: 59 | Attending: Hematology and Oncology

## 2022-05-07 ENCOUNTER — Inpatient Hospital Stay (HOSPITAL_BASED_OUTPATIENT_CLINIC_OR_DEPARTMENT_OTHER): Payer: 59 | Admitting: Hematology and Oncology

## 2022-05-07 VITALS — BP 99/65 | HR 72 | Temp 97.7°F | Resp 18 | Ht 64.0 in | Wt 109.2 lb

## 2022-05-07 DIAGNOSIS — C8118 Nodular sclerosis classical Hodgkin lymphoma, lymph nodes of multiple sites: Secondary | ICD-10-CM | POA: Diagnosis not present

## 2022-05-07 DIAGNOSIS — M2550 Pain in unspecified joint: Secondary | ICD-10-CM | POA: Insufficient documentation

## 2022-05-07 DIAGNOSIS — C8111 Nodular sclerosis classical Hodgkin lymphoma, lymph nodes of head, face, and neck: Secondary | ICD-10-CM | POA: Diagnosis present

## 2022-05-07 DIAGNOSIS — G8929 Other chronic pain: Secondary | ICD-10-CM | POA: Insufficient documentation

## 2022-05-07 DIAGNOSIS — F17200 Nicotine dependence, unspecified, uncomplicated: Secondary | ICD-10-CM | POA: Diagnosis not present

## 2022-05-07 LAB — CBC WITH DIFFERENTIAL (CANCER CENTER ONLY)
Abs Immature Granulocytes: 0.02 10*3/uL (ref 0.00–0.07)
Basophils Absolute: 0 10*3/uL (ref 0.0–0.1)
Basophils Relative: 1 %
Eosinophils Absolute: 0.1 10*3/uL (ref 0.0–0.5)
Eosinophils Relative: 3 %
HCT: 39.4 % (ref 36.0–46.0)
Hemoglobin: 13.8 g/dL (ref 12.0–15.0)
Immature Granulocytes: 0 %
Lymphocytes Relative: 23 %
Lymphs Abs: 1.2 10*3/uL (ref 0.7–4.0)
MCH: 33.1 pg (ref 26.0–34.0)
MCHC: 35 g/dL (ref 30.0–36.0)
MCV: 94.5 fL (ref 80.0–100.0)
Monocytes Absolute: 0.7 10*3/uL (ref 0.1–1.0)
Monocytes Relative: 13 %
Neutro Abs: 3.3 10*3/uL (ref 1.7–7.7)
Neutrophils Relative %: 60 %
Platelet Count: 314 10*3/uL (ref 150–400)
RBC: 4.17 MIL/uL (ref 3.87–5.11)
RDW: 12.1 % (ref 11.5–15.5)
WBC Count: 5.4 10*3/uL (ref 4.0–10.5)
nRBC: 0 % (ref 0.0–0.2)

## 2022-05-07 LAB — CMP (CANCER CENTER ONLY)
ALT: 11 U/L (ref 0–44)
AST: 15 U/L (ref 15–41)
Albumin: 4 g/dL (ref 3.5–5.0)
Alkaline Phosphatase: 69 U/L (ref 38–126)
Anion gap: 5 (ref 5–15)
BUN: 13 mg/dL (ref 6–20)
CO2: 29 mmol/L (ref 22–32)
Calcium: 9.6 mg/dL (ref 8.9–10.3)
Chloride: 104 mmol/L (ref 98–111)
Creatinine: 0.95 mg/dL (ref 0.44–1.00)
GFR, Estimated: >60 mL/min (ref >60)
Glucose, Bld: 79 mg/dL (ref 70–99)
Potassium: 4.4 mmol/L (ref 3.5–5.1)
Sodium: 138 mmol/L (ref 135–145)
Total Bilirubin: 0.4 mg/dL (ref 0.3–1.2)
Total Protein: 7.2 g/dL (ref 6.5–8.1)

## 2022-05-07 NOTE — Progress Notes (Signed)
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[place]) She has no clinical signs or symptoms of recurrence Her last imaging study was 2 years ago In the absence of symptoms, I do not feel strongly we need to order serial surveillance imaging studies I plan to see her again in 6 months for further follow-up The patient is educated to watch for signs and symptoms of cancer recurrence  Chronic joint pain She has chronic musculoskeletal pain since discontinuation of treatment She will continue prescribed tramadol We discussed narcotic refill policy  Orders Placed This Encounter  Procedures   CMP ([facility] only)    Standing Status:   Future    Standing Expiration Date:   05/08/2023   CBC with Differential (Cancer Center Only)    Standing Status:   Future    Standing Expiration Date:   05/08/2023    All questions were answered. The patient knows to call the clinic with any problems, questions or concerns. The total time spent in the appointment was 20 minutes encounter with patients including review of chart and various tests results, discussions about plan of care and coordination of care plan   Heath Lark, MD 05/07/2022 10:09 AM  INTERVAL HISTORY: Please see below for problem oriented charting. she returns for surveillance follow-up for history of Hodgkin lymphoma She continues to smoke cigar periodically Denies new lymphadenopathy or recent infection  REVIEW OF SYSTEMS:   Constitutional: Denies fevers, chills or abnormal weight loss Eyes: Denies blurriness of vision Ears, nose, mouth, throat, and  face: Denies mucositis or sore throat Respiratory: Denies cough, dyspnea or wheezes Cardiovascular: Denies palpitation, chest discomfort or lower extremity swelling Gastrointestinal:  Denies nausea, heartburn or change in bowel habits Skin: Denies abnormal skin rashes Lymphatics: Denies new lymphadenopathy or easy bruising Neurological:Denies numbness, tingling or new weaknesses Behavioral/Psych: Mood is stable, no new changes  All other systems were reviewed with the patient and are negative.  I have reviewed the past medical history, past surgical history, social history and family history with the patient and they are unchanged from previous note.  ALLERGIES:  is allergic to temazepam.  MEDICATIONS:  Current Outpatient Medications  Medication Sig Dispense Refill   benzonatate (TESSALON) 100 MG capsule Take 1 capsule (100 mg total) by mouth every 8 (eight) hours. 21 capsule 0   bictegravir-emtricitabine-tenofovir AF (BIKTARVY) 50-200-25 MG TABS tablet TAKE 1 TABLET BY MOUTH DAILY 30 tablet 2   cholecalciferol (VITAMIN D) 1000 units tablet Take 1 tablet (1,000 Units total) by mouth daily. 30 tablet 9   dronabinol (MARINOL) 5 MG capsule Take 1 capsule (5 mg total) by mouth 2 (two) times daily before a meal. 60 capsule 5   hydrOXYzine (ATARAX) 10 MG tablet TAKE 1 TABLET(10 MG) BY MOUTH THREE TIMES DAILY AS NEEDED 90 tablet 0   methocarbamol (ROBAXIN) 500 MG tablet Take 1 tablet (500 mg total) by mouth 2 (two) times daily. 20 tablet 0   ondansetron (ZOFRAN) 4 MG tablet Take 1 tablet (4 mg total) by mouth every 6 (six) hours. 12 tablet 0   traMADol (ULTRAM) 50 MG tablet Take 2 tablets (100 mg total) by mouth every 6 (six) hours as  needed. 90 tablet 0   No current facility-administered medications for this visit.    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   05/07/2014 Imaging   CT scan of the chest show right thoracic inlet lymphadenopathy   06/07/2014 Procedure   She underwent ultrasound-guided core biopsy of the neck lymph node   06/07/2014 Pathology Results   Accession: [id number] biopsy confirmed diagnosis of Hodgkin lymphoma.   06/15/2014 Imaging   Echocardiogram showed preserved ejection fraction   07/09/2014 - 07/12/2014 Hospital Admission   She was admitted to the hospital for severe anemia.   07/27/2014 Procedure   She had placement of port   07/31/2014 - 09/11/2014 Chemotherapy   She received dose adjusted chemotherapy due to abnormal liver function tests and severe anemia. Treatment was delayed due to noncompliance  and subsequently stopped because the patient failed to keep appointments   01/11/2015 Imaging   Repeat PET CT scan showed response to treatment   01/28/2015 - 06/18/2015 Chemotherapy   ABVD was restarted with full dose.   02/08/2015 - 02/10/2015 Hospital Admission   The patient was admitted to the hospital due to pancytopenia and profuse diarrhea. Cultures were negative. She was placed on ciprofloxacin.   02/11/2015 Adverse Reaction   Treatment was placed on hold due to recent infection.   04/11/2015 Imaging   PET CT scan showed near complete response. Incidental finding of an abnormal bone lesion, indeterminate. She is not symptomatic. Recommendation from Hem [facility] to observe   07/11/2015 Imaging   PET CT scan showed abnormal new bone lesions, suggestive of possible disease progression   07/23/2015 Bone Marrow Biopsy   She underwent bone biopsy   07/23/2015 Pathology Results   Accession: [id number]  biopsy was negative for cancer   11/21/2015 Surgery   She had surgery for ectopic pregnancy   01/29/2016 Imaging   Ct chest, abdomen and pelvis showed pelvic and retroperitoneal lymphadenopathy, as above, concerning for residual disease. There is also a mildly enlarged posterior mediastinal lymph node  measuring 11 mm adjacent to the distal descending thoracic aorta. This may represent an additional focus of disease, but is the only finding of concern in the thorax on today's examination. Sclerosis in the right ilium at site of previously noted metabolically active lesion, grossly unchanged. No other definite osseous lesions are identified on today's examination. Spleen is normal in size and appearance.   02/14/2016 PET scan   Interval disease worsening with new foci of hypermetabolic activity in multiple retroperitoneal and pelvic lymph nodes as well as AP window and left hilar lymph nodes. ([place]ville 5). There is also overall worsening of the osseous disease.   05/11/2016 Pathology Results   Diagnosis Lymph node, needle/core biopsy, Left para-aortic retroperitoneal - CLASSICAL HODGKIN LYMPHOMA. - SEE ONCOLOGY TABLE. Microscopic Comment LYMPHOMA Histologic type: Classical Hodgkin lymphoma. Grade (if applicable): N/A Flow cytometry: Not done. Immunohistochemical stains: CD15, CD20, CD3, LCA, PAX-5, CD30 with appropriate controls. Touch preps/imprints: Not performed. Comments: The sections show small needle core biopsy fragments displaying a polymorphous cellular proliferation of small lymphocytes, plasma cells, eosinophils, and large atypical mononuclear and multilobated lymphoid cells with features of Reed-Sternberg cells and variants. This is associated with patchy areas of fibrosis. Immunohistochemical stains were performed and show that the large atypical lymphoid cells are positive for CD30, CD15 and PAX-5 and negative for LCA, CD20, CD3. The small lymphoid cells in the  background show a mixture of T and B cells with predominance of T cells. The overall morphologic and histologic features are consistent with classical Hodgkin lymphoma. Further subtyping is challenging in limited small biopsy fragments but the patchy fibrosis suggests nodular sclerosis type.    05/11/2016 Procedure   She  underwent CT guided biopsy of retroperitoneal lymph node   05/26/2016 Procedure   Successful placement of a right IJ approach Power Port with ultrasound and fluoroscopic guidance. The catheter is ready for use.   06/02/2016 PET scan   Mixed response to chemotherapy with some lymph nodes decreased in metabolic activity and some lymph nodes increase metabolic activity. Lymph node stations including mediastinum, periaortic retroperitoneum, and obturator node stations. Activity is remains relatively intense Deauville 4 2. LEFT infrahilar nodule / lymph node with intense metabolic activity decreased from prior. ( [place] 4 ). 3. 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 Deauville 5   06/03/2016 - 06/05/2016 Hospital Admission   She was admitted to the hospital for cycle 1 of ICE chemotherapy   06/24/2016 - 06/26/2016 Hospital Admission   She received cycle 2 of ICE chemo   07/07/2016 PET scan   Resolution of prior hypermetabolic adenopathy and resolution of prior osseous foci of hypermetabolic activity compatible with essentially complete response to therapy ([place]au[place] 1). 2. Generalized reduced activity in the L4 vertebral body and the type of finding which would typically reflect prior radiation therapy. 3. Stable septated fatty right pelvic lesion, possibly a dermoid or lipoma, not hypermetabolic   4/40/3474 PET scan   Hypermetabolic lesion along the L3 vertebral body and left posterior elements, max SUV 7.8 (Deauville 5). Hypermetabolic lesion along the left inferior pubic ramus, max SUV 4.9 (Deauville 4).  IMPRESSION: Prevascular lymphadenopathy, reflecting nodal recurrence (Deauville 4). Hypermetabolic osseous metastases involving the L3 vertebral body/posterior elements and left inferior pubic ramus (Deauville 4-5). Hypermetabolism along the endometrium, new, possibly reactive/physiologic. Consider pelvic ultrasound and/or endometrial sampling as clinically warranted.    11/04/2016 - 02/24/2017 Chemotherapy   She received Brentuximab   12/11/2016 PET scan   1. Mixed response to therapy within the skeleton. Lesions at L3 is decreased in size and metabolic activity. Residual activity remains above liver ( [place] 4) 2. Increased activity in the LEFT sacrum with metabolic activity above liver activity ( [place] 4 3. Decrease in size and metabolic activity of anterior mediastinal tissue consistent with resolution of thymic hyperplasia or resolution of lymphoma metabolic activity ( Deauville 2). 4. No new lymphadenopathy.  Normal spleen and liver.   03/25/2017 PET scan   1. Increased metabolic activity in several normal sized retroperitoneal and right pelvic lymph nodes, Deauville 4. 2. Increase in size and metabolic activity of lesions in the L3 and L4 vertebral bodies and in the left sacrum, Deauville 5. 3. No recurrence of anterior mediastinal abnormal activity. 4. Other imaging findings of potential clinical significance: Chronic ethmoid and left maxillary sinusitis. Paraseptal emphysema at the lung apices. Right ovarian dermoid.   04/06/2017 -  Chemotherapy   The patient had Keytruda   04/06/2017 - 05/23/2019 Chemotherapy   The patient had pembrolizumab (KEYTRUDA) 200 mg in sodium chloride 0.9 % 50 mL chemo infusion, 200 mg, Intravenous, Once, 30 of 31 cycles Administration: 200 mg (04/06/2017), 200 mg (04/29/2017), 200 mg (05/20/2017), 200 mg (06/10/2017), 200 mg (07/01/2017), 200 mg (07/22/2017), 200 mg (08/12/2017), 200 mg (09/02/2017), 200 mg (10/04/2017), 200 mg (11/03/2017), 200 mg (12/02/2017), 200 mg (12/30/2017), 200 mg (01/27/2018), 200 mg (02/24/2018), 200  mg (03/22/2018), 200 mg (04/19/2018), 200 mg (05/17/2018), 200 mg (06/14/2018), 200 mg (07/12/2018), 200 mg (08/09/2018), 200 mg (09/06/2018), 200 mg (10/04/2018), 200 mg (11/01/2018), 200 mg (11/29/2018), 200 mg (12/27/2018), 200 mg (01/24/2019), 200 mg (03/14/2019), 200 mg (05/23/2019)  for chemotherapy treatment.     06/09/2017 PET scan   1. Overall significant improvement with reduction in nodal activity. Several retroperitoneal nodes which were previously [place]ville 4 are currently [place] 3. The right common iliac lymph node remains at [place] 4 with maximum SUV of 3.4, but is improved from prior SUV of 4.3. 2. The bony lesions show the greatest improvement, with the previously [place]ville 5 hypermetabolic lesions currently no longer of higher metabolic activity than surrounding bone marrow, currently measuring at [place] 3. 3. 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 4. No new lesions are identified. 5.  Emphysema (ICD10-J43.9).   08/05/2017 Imaging   1. Somewhat complex left adnexal lesion, potentially hemorrhagic cyst measuring 2.9 x 2.8 cm. The possibility of torsion in the left ovary must be considered. Advise correlation with pelvic ultrasound including Doppler assessment to further evaluate.   2.  Right ovarian dermoid, unchanged from recent PET-CT examination.   3. No adenopathy by size criteria evident. Appearance of subcentimeter mesenteric lymph nodes is stable compared to recent study.   4. No bone lesions appreciable by CT. Areas of abnormal radiotracer uptake in the mid lumbar spine noted on recent PET-CT examination. No destruction 2 or lytic change noted in the lumbar vertebrae currently there.   5. No bowel obstruction. No abscess. No periappendiceal region inflammation.   6.  Small hiatal hernia with fluid in distal esophagus.   7.  Foci of coronary artery calcification, advanced for age.   8.  Spleen normal in size and contour.   9. Small hiatal hernia with mild fluid in the distal esophagus, likely indicative of a degree of reflux.   01/04/2018 Imaging   1. No findings to suggest residual/recurrent lymphoma in the chest, abdomen or pelvis. 2. Right ovarian dermoid slightly larger than prior examinations, currently measuring 3.8 x 2.8  x 3.1 cm. 3. Additional incidental findings, as above.   07/11/2018 Imaging   No findings suspicious for recurrent lymphoma. Spleen is normal in size.   Stable right ovarian dermoid.   4 mm subpleural nodular opacity in the posterior right lower lobe, favoring subpleural atelectasis.   07/17/2019 Imaging   1. Stable exam. No new or progressive findings to suggest recurrent disease. 2. Stable right adnexal dermoid. 3. Emphysema (ICD10-J43.9).   09/19/2019 Procedure   Successful right IJ vein Port-A-Cath explant.   12/05/2019 Imaging   1. Stable exam. No findings to suggest recurrent lymphoma. 2. 9 mm left para-aortic node is upper normal for size and mildly increased from 7 mm previously. Attention on follow-up recommended.  3. Stable right adnexal dermoid. 4. Emphysema (ICD10-J43.9).       PHYSICAL EXAMINATION: ECOG PERFORMANCE STATUS: 0 - Asymptomatic  Vitals:   05/07/22 0800  BP: 99/65  Pulse: 72  Resp: 18  Temp: 97.7 F (36.5 C)   Filed Weights   05/07/22 0800  Weight: 109 lb 3.2 oz (49.5 kg)    GENERAL:alert, no distress and comfortable SKIN: skin color, texture, turgor are normal, no rashes or significant lesions EYES: normal, Conjunctiva are pink and non-injected, sclera clear OROPHARYNX:no exudate, no erythema and lips, buccal mucosa, and tongue normal  NECK: supple, thyroid normal size, non-tender, without nodularity LYMPH:  no palpable lymphadenopathy in  the cervical, axillary or inguinal LUNGS: clear to auscultation and percussion with normal breathing effort HEART: regular rate & rhythm and no murmurs and no lower extremity edema ABDOMEN:abdomen soft, non-tender and normal bowel sounds Musculoskeletal:no cyanosis of digits and no clubbing  NEURO: alert & oriented x 3 with fluent speech, no focal motor/sensory deficits  LABORATORY DATA:  I have reviewed the data as listed    Component Value Date/Time   NA 138 05/07/2022 0732   NA 140 04/29/2017 0745    K 4.4 05/07/2022 0732   K 3.3 (L) 04/29/2017 0745   CL 104 05/07/2022 0732   CO2 29 05/07/2022 0732   CO2 23 04/29/2017 0745   GLUCOSE 79 05/07/2022 0732   GLUCOSE 83 04/29/2017 0745   BUN 13 05/07/2022 0732   BUN 10.7 04/29/2017 0745   CREATININE 0.95 05/07/2022 0732   CREATININE 1.2 (H) 04/29/2017 0745   CALCIUM 9.6 05/07/2022 0732   CALCIUM 9.8 04/29/2017 0745   PROT 7.2 05/07/2022 0732   PROT 7.6 04/29/2017 0745   ALBUMIN 4.0 05/07/2022 0732   ALBUMIN 4.0 04/29/2017 0745   AST 15 05/07/2022 0732   AST 19 04/29/2017 0745   ALT 11 05/07/2022 0732   ALT 19 04/29/2017 0745   ALKPHOS 69 05/07/2022 0732   ALKPHOS 81 04/29/2017 0745   BILITOT 0.4 05/07/2022 0732   BILITOT 1.31 (H) 04/29/2017 0745   GFRNONAA >60 05/07/2022 0732   GFRNONAA 83 02/20/2016 0908   GFRAA >60 11/07/2019 0740   GFRAA >89 02/20/2016 0908    No results found for: "SPEP", "UPEP"  Lab Results  Component Value Date   WBC 5.4 05/07/2022   NEUTROABS 3.3 05/07/2022   HGB 13.8 05/07/2022   HCT 39.4 05/07/2022   MCV 94.5 05/07/2022   PLT 314 05/07/2022      Chemistry      Component Value Date/Time   NA 138 05/07/2022 0732   NA 140 04/29/2017 0745   K 4.4 05/07/2022 0732   K 3.3 (L) 04/29/2017 0745   CL 104 05/07/2022 0732   CO2 29 05/07/2022 0732   CO2 23 04/29/2017 0745   BUN 13 05/07/2022 0732   BUN 10.7 04/29/2017 0745   CREATININE 0.95 05/07/2022 0732   CREATININE 1.2 (H) 04/29/2017 0745      Component Value Date/Time   CALCIUM 9.6 05/07/2022 0732   CALCIUM 9.8 04/29/2017 0745   ALKPHOS 69 05/07/2022 0732   ALKPHOS 81 04/29/2017 0745   AST 15 05/07/2022 0732   AST 19 04/29/2017 0745   ALT 11 05/07/2022 0732   ALT 19 04/29/2017 0745   BILITOT 0.4 05/07/2022 0732   BILITOT 1.31 (H) 04/29/2017 0745

## 2022-05-07 NOTE — Assessment & Plan Note (Signed)
She has no clinical signs or symptoms of recurrence Her last imaging study was 2 years ago In the absence of symptoms, I do not feel strongly we need to order serial surveillance imaging studies I plan to see her again in 6 months for further follow-up The patient is educated to watch for signs and symptoms of cancer recurrence

## 2022-05-07 NOTE — Assessment & Plan Note (Signed)
She has chronic musculoskeletal pain since discontinuation of treatment She will continue prescribed tramadol We discussed narcotic refill policy

## 2022-05-18 ENCOUNTER — Telehealth: Payer: Self-pay

## 2022-05-18 ENCOUNTER — Other Ambulatory Visit: Payer: Self-pay | Admitting: Hematology

## 2022-05-18 DIAGNOSIS — B20 Human immunodeficiency virus [HIV] disease: Secondary | ICD-10-CM

## 2022-05-18 MED ORDER — TRAMADOL HCL 50 MG PO TABS: 100.0000 mg | ORAL_TABLET | Freq: Four times a day (QID) | ORAL | 0 refills | Status: DC | PRN

## 2022-05-18 NOTE — Telephone Encounter (Signed)
Called and left message Rx sent to pharmacy.

## 2022-05-18 NOTE — Telephone Encounter (Signed)
Received a call requesting Tramadol refill to pharmacy. She is out of medication. Sent a message to provider asking for refill.

## 2022-05-20 ENCOUNTER — Encounter: Payer: Self-pay | Admitting: Internal Medicine

## 2022-05-20 ENCOUNTER — Other Ambulatory Visit: Payer: Self-pay

## 2022-05-20 ENCOUNTER — Ambulatory Visit (INDEPENDENT_AMBULATORY_CARE_PROVIDER_SITE_OTHER): Payer: 59 | Admitting: Internal Medicine

## 2022-05-20 VITALS — BP 111/75 | HR 62 | Temp 98.2°F | Wt 109.0 lb

## 2022-05-20 DIAGNOSIS — B20 Human immunodeficiency virus [HIV] disease: Secondary | ICD-10-CM | POA: Diagnosis not present

## 2022-05-20 DIAGNOSIS — N898 Other specified noninflammatory disorders of vagina: Secondary | ICD-10-CM | POA: Diagnosis not present

## 2022-05-20 NOTE — Assessment & Plan Note (Addendum)
New issue.  She has had unusual clear fluid discharge of unknown etiology.  She was tested negative for STIs.   She requests evaluation and will have her scheduled for a gyn evaluation during the PAP clinic.

## 2022-05-20 NOTE — Assessment & Plan Note (Signed)
She continues to do well on Biktarvy with no missed doses or concerns.  Will verify with labs today and she can return in 6 months.

## 2022-05-20 NOTE — Progress Notes (Signed)
   Subjective:    Patient ID: [name], female    DOB: Feb 25, 1986, 37 y.o.   MRN: [id number]  HPI [name] is here for her routine follow up of HIV She continues on Biktarvy with no missed doses.  Has remained undetectable.  No issues getting or taking her medication.   She reports some recent unusual clear vaginal discharge, watery, no blood, no pus.  Not associated with any pain.  Mentrual periods have been more regular now.      Review of Systems  Constitutional:  Negative for fatigue.  Gastrointestinal:  Negative for diarrhea and nausea.  Skin:  Negative for rash.       Objective:   Physical Exam Eyes:     General: No scleral icterus. Pulmonary:     Effort: Pulmonary effort is normal.  Neurological:     Mental Status: She is alert.   SH: + tobacco        Assessment & Plan:

## 2022-05-21 LAB — T-HELPER CELL (CD4) - (RCID CLINIC ONLY)
CD4 % Helper T Cell: 41 % (ref 33–65)
CD4 T Cell Abs: 532 /uL (ref 400–1790)

## 2022-05-23 LAB — HIV-1 RNA QUANT-NO REFLEX-BLD
HIV 1 RNA Quant: NOT DETECTED Copies/mL
HIV-1 RNA Quant, Log: NOT DETECTED Log cps/mL

## 2022-05-28 ENCOUNTER — Other Ambulatory Visit: Payer: Self-pay | Admitting: Hematology and Oncology

## 2022-05-28 ENCOUNTER — Telehealth: Payer: Self-pay

## 2022-05-28 DIAGNOSIS — B20 Human immunodeficiency virus [HIV] disease: Secondary | ICD-10-CM

## 2022-05-28 MED ORDER — TRAMADOL HCL 50 MG PO TABS: 100.0000 mg | ORAL_TABLET | Freq: Four times a day (QID) | ORAL | 0 refills | Status: DC | PRN

## 2022-05-28 NOTE — Telephone Encounter (Signed)
She called and left a message requesting tramadol refill to [facility].

## 2022-05-28 NOTE — Telephone Encounter (Signed)
done

## 2022-06-09 ENCOUNTER — Other Ambulatory Visit: Payer: Self-pay | Admitting: Hematology and Oncology

## 2022-06-09 ENCOUNTER — Telehealth: Payer: Self-pay

## 2022-06-09 DIAGNOSIS — B20 Human immunodeficiency virus [HIV] disease: Secondary | ICD-10-CM

## 2022-06-09 MED ORDER — TRAMADOL HCL 50 MG PO TABS: 100.0000 mg | ORAL_TABLET | Freq: Four times a day (QID) | ORAL | 0 refills | Status: DC | PRN

## 2022-06-09 NOTE — Telephone Encounter (Signed)
done 

## 2022-06-09 NOTE — Telephone Encounter (Signed)
Called and left a message Rx sent. 

## 2022-06-09 NOTE — Telephone Encounter (Signed)
She called and left a message requesting refill on Tramadol to [facility].

## 2022-06-10 ENCOUNTER — Other Ambulatory Visit: Payer: Self-pay | Admitting: Internal Medicine

## 2022-06-10 DIAGNOSIS — B20 Human immunodeficiency virus [HIV] disease: Secondary | ICD-10-CM

## 2022-06-11 ENCOUNTER — Ambulatory Visit (INDEPENDENT_AMBULATORY_CARE_PROVIDER_SITE_OTHER): Payer: 59 | Admitting: Infectious Diseases

## 2022-06-11 ENCOUNTER — Encounter: Payer: Self-pay | Admitting: Infectious Diseases

## 2022-06-11 ENCOUNTER — Other Ambulatory Visit (HOSPITAL_COMMUNITY)
Admission: RE | Admit: 2022-06-11 | Discharge: 2022-06-11 | Disposition: A | Discharge status: Home / Self Care | Payer: 59 | Source: Ambulatory Visit | Attending: Infectious Diseases | Admitting: Infectious Diseases

## 2022-06-11 ENCOUNTER — Other Ambulatory Visit: Payer: Self-pay

## 2022-06-11 VITALS — BP 105/66 | HR 83 | Ht 64.0 in | Wt 109.0 lb

## 2022-06-11 DIAGNOSIS — Z1151 Encounter for screening for human papillomavirus (HPV): Secondary | ICD-10-CM | POA: Diagnosis not present

## 2022-06-11 DIAGNOSIS — Z124 Encounter for screening for malignant neoplasm of cervix: Secondary | ICD-10-CM

## 2022-06-11 DIAGNOSIS — Z01419 Encounter for gynecological examination (general) (routine) without abnormal findings: Secondary | ICD-10-CM | POA: Diagnosis present

## 2022-06-11 DIAGNOSIS — L739 Follicular disorder, unspecified: Secondary | ICD-10-CM

## 2022-06-11 DIAGNOSIS — N898 Other specified noninflammatory disorders of vagina: Secondary | ICD-10-CM | POA: Diagnosis not present

## 2022-06-11 DIAGNOSIS — K649 Unspecified hemorrhoids: Secondary | ICD-10-CM

## 2022-06-11 MED ORDER — FLUCONAZOLE 150 MG PO TABS: 150.0000 mg | ORAL_TABLET | ORAL | 0 refills | Status: DC

## 2022-06-11 MED ORDER — MUPIROCIN CALCIUM 2 % EX CREA: 1.0000 | TOPICAL_CREAM | Freq: Two times a day (BID) | CUTANEOUS | 1 refills | Status: DC

## 2022-06-11 MED ORDER — HYDROCORTISONE (PERIANAL) 2.5 % EX CREA: 1.0000 | TOPICAL_CREAM | Freq: Two times a day (BID) | CUTANEOUS | 1 refills | Status: DC

## 2022-06-11 NOTE — Assessment & Plan Note (Signed)
Pelvic exam normal aside from candidal vaginitis. Cervical brushing collected for cytology and HPV.  Discussed recommended screening interval for women living with HIV disease meant to be lifelong and at an interval of Q1-3 years pending results. Acceptable to space out Q3y with 3 consecutively normal exams. Further recommendations for [name] to follow today's results.  Results will be communicated to the patient via telephone call.
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
## 2022-06-11 NOTE — Assessment & Plan Note (Signed)
Exam c/w prolapsed external hemorrhoid. It is not thrombosed. Will treat with topical steroid cream BID, stool softeners, squatty potty recommended as well to help limit direct pressure and prolonged sitting.

## 2022-06-11 NOTE — Assessment & Plan Note (Signed)
Based off her LMP and the fact that it self resolved this could have been normal thinning of cervical mucous related to menses. On exam she has white plaques on the vaginal walls more c/w candidal vaginitis. Will give 150 mg q3d x 3 doses.

## 2022-06-11 NOTE — Patient Instructions (Signed)
Stool softeners to avoid constipation - can try something like magnesium citrate capsules at night (250 - 500 mg). These are over the counter but can help with softening bowel movements.   Miralax is a powder to put in your drink once a day (over the counter)   Colace is a gentle stool softener you can take 1-2 times a day (over the counter)  Look at a squatty potty to help with preventing these from coming back and hopefully limiting time spent on the toilet.   I sent in a steroid cream to put on the hemorrhoid twice a day called Hydrocortisone 2.5%   For the bumps - will use an antibacterial cream for you called Mupirocin. Use this 2-3 times a day for a week when they come up.

## 2022-06-12 LAB — CERVICOVAGINAL ANCILLARY ONLY
Bacterial Vaginitis (gardnerella): POSITIVE — AB
Chlamydia: NEGATIVE
Comment: NEGATIVE
Comment: NEGATIVE
Comment: NEGATIVE
Comment: NORMAL
Neisseria Gonorrhea: NEGATIVE
Trichomonas: NEGATIVE

## 2022-06-12 LAB — URINE CYTOLOGY ANCILLARY ONLY
Chlamydia: NEGATIVE
Comment: NEGATIVE
Comment: NEGATIVE
Comment: NORMAL
Neisseria Gonorrhea: NEGATIVE
Trichomonas: NEGATIVE

## 2022-06-12 LAB — RPR: RPR Ser Ql: NONREACTIVE

## 2022-06-15 LAB — CYTOLOGY - PAP
Comment: NEGATIVE
Diagnosis: NEGATIVE
High risk HPV: POSITIVE — AB

## 2022-06-15 MED ORDER — METRONIDAZOLE 500 MG PO TABS: 500.0000 mg | ORAL_TABLET | Freq: Two times a day (BID) | ORAL | 0 refills | Status: DC

## 2022-06-15 NOTE — Telephone Encounter (Signed)
Patient aware of vaginal swab results. RX for Metronidazole 500 MG BID sent to [facility] on [address].    [place], CMA

## 2022-06-15 NOTE — Telephone Encounter (Signed)
-----   Message from Turley Callas, NP sent at 06/15/2022  8:11 AM EST ----- Please call [name] to let her know that her vaginal swab returned for bacterial vaginitis - "BV"  Not an STD, just an overgrowth of bacteria.   Can either do metronidazole 500 mg tab, 1 BID x 7d or some women opt for boric acid vaginal suppositories over the counter - 1 supp nightly for 1 week.   Please send in the metronidazole if she prefer that option.

## 2022-06-22 ENCOUNTER — Other Ambulatory Visit: Payer: Self-pay | Admitting: Hematology and Oncology

## 2022-06-22 ENCOUNTER — Telehealth: Payer: Self-pay

## 2022-06-22 DIAGNOSIS — B20 Human immunodeficiency virus [HIV] disease: Secondary | ICD-10-CM

## 2022-06-22 MED ORDER — TRAMADOL HCL 50 MG PO TABS: 100.0000 mg | ORAL_TABLET | Freq: Four times a day (QID) | ORAL | 0 refills | Status: DC | PRN

## 2022-06-22 NOTE — Telephone Encounter (Signed)
Done

## 2022-06-22 NOTE — Telephone Encounter (Signed)
Called and left a message Rx sent to pharmacy. Ask her to call the office back for questions.

## 2022-06-22 NOTE — Telephone Encounter (Signed)
She called and left a message requesting Tramadol refill to pharmacy. 

## 2022-06-29 ENCOUNTER — Telehealth: Payer: Self-pay

## 2022-06-29 NOTE — Telephone Encounter (Signed)
Patient called about PAP results, reviewed Stephanie's MyChart message with her and explained that further testing by gynecology is needed. She would like a referral.  Beryle Flock, RN

## 2022-06-30 ENCOUNTER — Telehealth: Payer: Self-pay

## 2022-06-30 ENCOUNTER — Other Ambulatory Visit: Payer: Self-pay | Admitting: Internal Medicine

## 2022-06-30 DIAGNOSIS — Z124 Encounter for screening for malignant neoplasm of cervix: Secondary | ICD-10-CM

## 2022-06-30 DIAGNOSIS — N9089 Other specified noninflammatory disorders of vulva and perineum: Secondary | ICD-10-CM

## 2022-06-30 DIAGNOSIS — N926 Irregular menstruation, unspecified: Secondary | ICD-10-CM

## 2022-06-30 DIAGNOSIS — D013 Carcinoma in situ of anus and anal canal: Secondary | ICD-10-CM

## 2022-06-30 NOTE — Telephone Encounter (Signed)
Patient called to follow up on referral. Informed patient Dr.Comer has ordered referral to Dr. Janett Billow at [facility]. Patient stated that she contacted that OBGYN office and they don't accept her insurance. Along with OBGYN on [address] and [address].

## 2022-07-06 ENCOUNTER — Telehealth: Payer: Self-pay

## 2022-07-06 ENCOUNTER — Other Ambulatory Visit: Payer: Self-pay | Admitting: Hematology and Oncology

## 2022-07-06 DIAGNOSIS — B20 Human immunodeficiency virus [HIV] disease: Secondary | ICD-10-CM

## 2022-07-06 MED ORDER — TRAMADOL HCL 50 MG PO TABS: 100.0000 mg | ORAL_TABLET | Freq: Four times a day (QID) | ORAL | 0 refills | Status: DC | PRN

## 2022-07-06 NOTE — Telephone Encounter (Signed)
done

## 2022-07-06 NOTE — Telephone Encounter (Signed)
She called and left a message requesting Tramadol refill to [facility].

## 2022-07-06 NOTE — Telephone Encounter (Signed)
Called and told her Rx sent. She verbalized understanding and appreciated the call.

## 2022-07-22 ENCOUNTER — Other Ambulatory Visit: Payer: Self-pay | Admitting: Physician Assistant

## 2022-07-22 ENCOUNTER — Other Ambulatory Visit: Payer: Self-pay

## 2022-07-22 DIAGNOSIS — B20 Human immunodeficiency virus [HIV] disease: Secondary | ICD-10-CM

## 2022-07-22 MED ORDER — TRAMADOL HCL 50 MG PO TABS: 100.0000 mg | ORAL_TABLET | Freq: Four times a day (QID) | ORAL | 0 refills | Status: DC | PRN

## 2022-07-22 NOTE — Telephone Encounter (Signed)
Pt has Hx Hodgkin Lymphoma, not currently being treated. She takes traMADol 50 mg 2 tabs PO q6h PRN pain qty 90 last filled 07/06/22. She is calling today requesting we refill this medication to [facility] on [address]. Advised pt I would route this request to [place], [place] to refill. She verbalized thanks and understanding.

## 2022-07-22 NOTE — Progress Notes (Signed)
Contacted by Farley Ly RN for patient's tramadol refill request for patient of Dr. Alvy Bimler. Prescription sent to pharmacy. I have reviewed the PDMP during this encounter.

## 2022-07-31 ENCOUNTER — Telehealth: Payer: Self-pay

## 2022-07-31 ENCOUNTER — Other Ambulatory Visit: Payer: Self-pay | Admitting: Hematology and Oncology

## 2022-07-31 DIAGNOSIS — B20 Human immunodeficiency virus [HIV] disease: Secondary | ICD-10-CM

## 2022-07-31 MED ORDER — TRAMADOL HCL 50 MG PO TABS: 100.0000 mg | ORAL_TABLET | Freq: Four times a day (QID) | ORAL | 0 refills | Status: DC | PRN

## 2022-07-31 NOTE — Telephone Encounter (Signed)
She called and left a message requesting Tramadol refill to [facility] pharmacy.  

## 2022-07-31 NOTE — Telephone Encounter (Signed)
done

## 2022-07-31 NOTE — Telephone Encounter (Signed)
Called and given below message Rx sent. She is aware.

## 2022-08-14 ENCOUNTER — Other Ambulatory Visit: Payer: Self-pay | Admitting: Hematology and Oncology

## 2022-08-14 ENCOUNTER — Telehealth: Payer: Self-pay

## 2022-08-14 DIAGNOSIS — B20 Human immunodeficiency virus [HIV] disease: Secondary | ICD-10-CM

## 2022-08-14 MED ORDER — TRAMADOL HCL 50 MG PO TABS: 100.0000 mg | ORAL_TABLET | Freq: Four times a day (QID) | ORAL | 0 refills | Status: DC | PRN

## 2022-08-14 NOTE — Telephone Encounter (Signed)
She called and left a message requesting Tramadol refill to [facility] please.

## 2022-08-14 NOTE — Telephone Encounter (Signed)
done

## 2022-08-25 ENCOUNTER — Telehealth: Payer: Self-pay

## 2022-08-25 ENCOUNTER — Other Ambulatory Visit: Payer: Self-pay | Admitting: Hematology and Oncology

## 2022-08-25 DIAGNOSIS — B20 Human immunodeficiency virus [HIV] disease: Secondary | ICD-10-CM

## 2022-08-25 MED ORDER — HYDROXYZINE HCL 10 MG PO TABS: ORAL_TABLET | ORAL | 0 refills | Status: DC

## 2022-08-25 MED ORDER — TRAMADOL HCL 50 MG PO TABS: 100.0000 mg | ORAL_TABLET | Freq: Four times a day (QID) | ORAL | 0 refills | Status: DC | PRN

## 2022-08-25 NOTE — Telephone Encounter (Signed)
Pt called requesting refill for Tramadol 50 mg 2 tab PO Q6H PRN pain and Hydroxyzine 10 mg 1 tab TID PRN. Tramadol was last filled 08/14/22 and Hydroxyzine was last filled 10/30/21. Pt requests refill to [facility] on [address]. Routed to MD to advise.

## 2022-08-25 NOTE — Telephone Encounter (Signed)
done

## 2022-09-08 ENCOUNTER — Other Ambulatory Visit: Payer: Self-pay | Admitting: Hematology and Oncology

## 2022-09-08 ENCOUNTER — Telehealth: Payer: Self-pay

## 2022-09-08 DIAGNOSIS — B20 Human immunodeficiency virus [HIV] disease: Secondary | ICD-10-CM

## 2022-09-08 MED ORDER — TRAMADOL HCL 50 MG PO TABS: 100.0000 mg | ORAL_TABLET | Freq: Four times a day (QID) | ORAL | 0 refills | Status: DC | PRN

## 2022-09-08 NOTE — Telephone Encounter (Signed)
[facility] called and they are unable to fill the Tramadol Rx at [facility] due to the system being down. Can you send the Rx to [facility] pharmacy on [address]?

## 2022-09-08 NOTE — Telephone Encounter (Signed)
Done

## 2022-09-08 NOTE — Telephone Encounter (Signed)
Called and left a message for [name] Rx sent to [facility] on [address] due to system down at [facility] location. Ask her to call the office with questions.  Canceled Tramadol Rx at [address] and Rx available at [facility].

## 2022-09-08 NOTE — Telephone Encounter (Signed)
I sent it there, make sure they have it and cancel the other prescription

## 2022-09-08 NOTE — Telephone Encounter (Signed)
She called and left message requesting Tramadol Rx to [facility] pharmacy.

## 2022-09-18 ENCOUNTER — Telehealth: Payer: Self-pay

## 2022-09-18 ENCOUNTER — Other Ambulatory Visit: Payer: Self-pay | Admitting: Hematology and Oncology

## 2022-09-18 DIAGNOSIS — B20 Human immunodeficiency virus [HIV] disease: Secondary | ICD-10-CM

## 2022-09-18 MED ORDER — TRAMADOL HCL 50 MG PO TABS: 100.0000 mg | ORAL_TABLET | Freq: Four times a day (QID) | ORAL | 0 refills | Status: DC | PRN

## 2022-09-18 NOTE — Telephone Encounter (Signed)
Called Pt and relayed the tramadol rx has been refilled to [facility] on [address]. Pt verbalized understanding.

## 2022-09-18 NOTE — Telephone Encounter (Signed)
done

## 2022-09-18 NOTE — Telephone Encounter (Signed)
Pt called and LVM asking for tramadol 50mg  tab rx refill. Will relay to MD.

## 2022-09-18 NOTE — Telephone Encounter (Signed)
Called Pt to notify tramadol rx refill has been sent. Pt asking if rx can be sent to [facility] on [address] instead.

## 2022-09-29 ENCOUNTER — Other Ambulatory Visit: Payer: Self-pay | Admitting: Hematology and Oncology

## 2022-09-29 ENCOUNTER — Telehealth: Payer: Self-pay

## 2022-09-29 DIAGNOSIS — B20 Human immunodeficiency virus [HIV] disease: Secondary | ICD-10-CM

## 2022-09-29 MED ORDER — TRAMADOL HCL 50 MG PO TABS
100.0000 mg | ORAL_TABLET | Freq: Four times a day (QID) | ORAL | 0 refills | Status: DC | PRN
Start: 2022-09-29 — End: 2022-10-09

## 2022-09-29 NOTE — Telephone Encounter (Signed)
She called and left a message requesting refill of Tramadol Rx to [facility] on [address].

## 2022-09-29 NOTE — Telephone Encounter (Signed)
done

## 2022-09-29 NOTE — Telephone Encounter (Signed)
Called and left a message Rx sent to pharmacy. 

## 2022-10-09 ENCOUNTER — Telehealth: Payer: Self-pay

## 2022-10-09 ENCOUNTER — Other Ambulatory Visit: Payer: Self-pay | Admitting: Hematology and Oncology

## 2022-10-09 DIAGNOSIS — B20 Human immunodeficiency virus [HIV] disease: Secondary | ICD-10-CM

## 2022-10-09 MED ORDER — TRAMADOL HCL 50 MG PO TABS
100.0000 mg | ORAL_TABLET | Freq: Four times a day (QID) | ORAL | 0 refills | Status: DC | PRN
Start: 2022-10-09 — End: 2022-10-10

## 2022-10-09 NOTE — Telephone Encounter (Signed)
Done

## 2022-10-09 NOTE — Telephone Encounter (Signed)
Called and left a message Rx sent to pharmacy. 

## 2022-10-09 NOTE — Telephone Encounter (Signed)
She called and left a message requesting Tramadol refill to [facility] pharmacy.  

## 2022-10-10 ENCOUNTER — Other Ambulatory Visit: Payer: Self-pay | Admitting: Hematology

## 2022-10-10 DIAGNOSIS — B20 Human immunodeficiency virus [HIV] disease: Secondary | ICD-10-CM

## 2022-10-10 MED ORDER — TRAMADOL HCL 50 MG PO TABS
100.0000 mg | ORAL_TABLET | Freq: Four times a day (QID) | ORAL | 0 refills | Status: DC | PRN
Start: 2022-10-10 — End: 2022-10-21

## 2022-10-21 ENCOUNTER — Other Ambulatory Visit: Payer: Self-pay

## 2022-10-21 ENCOUNTER — Telehealth: Payer: Self-pay

## 2022-10-21 DIAGNOSIS — B20 Human immunodeficiency virus [HIV] disease: Secondary | ICD-10-CM

## 2022-10-21 MED ORDER — TRAMADOL HCL 50 MG PO TABS
100.0000 mg | ORAL_TABLET | Freq: Four times a day (QID) | ORAL | 0 refills | Status: DC | PRN
Start: 2022-10-21 — End: 2022-10-30

## 2022-10-21 MED ORDER — TRAMADOL HCL 50 MG PO TABS
100.0000 mg | ORAL_TABLET | Freq: Four times a day (QID) | ORAL | 0 refills | Status: DC | PRN
Start: 2022-10-21 — End: 2022-10-21

## 2022-10-21 NOTE — Telephone Encounter (Signed)
This nurse returned call to patient who request refill on Tramadol.  Advised that request has been submitted to provider for refill.  Patient also states that she has a dry cough and would like to know what can she take over the counter.  This nurse verified that patient is not diabetic and she also denies fever.  This nurse suggested Robitussin over the counter for her cough.  If this is not effective patient knows to call the clinic.  No further questions or concerns noted at this time.

## 2022-10-30 ENCOUNTER — Telehealth: Payer: Self-pay

## 2022-10-30 ENCOUNTER — Other Ambulatory Visit: Payer: Self-pay | Admitting: Hematology and Oncology

## 2022-10-30 DIAGNOSIS — B20 Human immunodeficiency virus [HIV] disease: Secondary | ICD-10-CM

## 2022-10-30 MED ORDER — TRAMADOL HCL 50 MG PO TABS
100.0000 mg | ORAL_TABLET | Freq: Four times a day (QID) | ORAL | 0 refills | Status: DC | PRN
Start: 2022-10-30 — End: 2022-11-12

## 2022-10-30 NOTE — Telephone Encounter (Signed)
Called and left a message Rx sent. Ask her to call the office for questions. 

## 2022-10-30 NOTE — Telephone Encounter (Signed)
done

## 2022-10-30 NOTE — Telephone Encounter (Signed)
She called and left a message requesting Tramadol refill to [facility] on [address].

## 2022-11-02 ENCOUNTER — Telehealth: Payer: Self-pay

## 2022-11-02 NOTE — Telephone Encounter (Signed)
TC to pt to follow up after she called the after hours number on 10/31/22, requesting refill of Tramadol, which she stated pharmacy will not fill until 11/08/22. According to past notes, refill of 90 pills was called in by Dr. Bertis Ruddy on 10/30/22. Left message for pt to call back to further investigate situation.

## 2022-11-03 ENCOUNTER — Other Ambulatory Visit: Payer: Self-pay | Admitting: Internal Medicine

## 2022-11-03 DIAGNOSIS — B20 Human immunodeficiency virus [HIV] disease: Secondary | ICD-10-CM

## 2022-11-05 ENCOUNTER — Encounter: Payer: Self-pay | Admitting: Hematology and Oncology

## 2022-11-05 ENCOUNTER — Inpatient Hospital Stay: Payer: 59 | Attending: Hematology and Oncology

## 2022-11-05 ENCOUNTER — Other Ambulatory Visit: Payer: Self-pay | Admitting: Hematology and Oncology

## 2022-11-05 ENCOUNTER — Inpatient Hospital Stay (HOSPITAL_BASED_OUTPATIENT_CLINIC_OR_DEPARTMENT_OTHER): Payer: 59 | Admitting: Hematology and Oncology

## 2022-11-05 ENCOUNTER — Other Ambulatory Visit: Payer: Self-pay

## 2022-11-05 VITALS — BP 113/72 | HR 79 | Resp 18 | Ht 64.0 in | Wt 107.8 lb

## 2022-11-05 DIAGNOSIS — B20 Human immunodeficiency virus [HIV] disease: Secondary | ICD-10-CM | POA: Insufficient documentation

## 2022-11-05 DIAGNOSIS — M1990 Unspecified osteoarthritis, unspecified site: Secondary | ICD-10-CM | POA: Diagnosis not present

## 2022-11-05 DIAGNOSIS — R634 Abnormal weight loss: Secondary | ICD-10-CM | POA: Diagnosis not present

## 2022-11-05 DIAGNOSIS — C8118 Nodular sclerosis classical Hodgkin lymphoma, lymph nodes of multiple sites: Secondary | ICD-10-CM | POA: Insufficient documentation

## 2022-11-05 LAB — CMP (CANCER CENTER ONLY)
ALT: 21 U/L (ref 0–44)
AST: 19 U/L (ref 15–41)
Albumin: 3.7 g/dL (ref 3.5–5.0)
Alkaline Phosphatase: 79 U/L (ref 38–126)
Anion gap: 6 (ref 5–15)
BUN: 11 mg/dL (ref 6–20)
CO2: 29 mmol/L (ref 22–32)
Calcium: 9.7 mg/dL (ref 8.9–10.3)
Chloride: 103 mmol/L (ref 98–111)
Creatinine: 0.97 mg/dL (ref 0.44–1.00)
GFR, Estimated: >60 mL/min (ref >60)
Glucose, Bld: 84 mg/dL (ref 70–99)
Potassium: 3.7 mmol/L (ref 3.5–5.1)
Sodium: 138 mmol/L (ref 135–145)
Total Bilirubin: 0.4 mg/dL (ref 0.3–1.2)
Total Protein: 7.9 g/dL (ref 6.5–8.1)

## 2022-11-05 LAB — CBC WITH DIFFERENTIAL (CANCER CENTER ONLY)
Abs Immature Granulocytes: 0.02 10*3/uL (ref 0.00–0.07)
Basophils Absolute: 0 10*3/uL (ref 0.0–0.1)
Basophils Relative: 0 %
Eosinophils Absolute: 0.2 10*3/uL (ref 0.0–0.5)
Eosinophils Relative: 2 %
HCT: 37.9 % (ref 36.0–46.0)
Hemoglobin: 12.9 g/dL (ref 12.0–15.0)
Immature Granulocytes: 0 %
Lymphocytes Relative: 14 %
Lymphs Abs: 1 10*3/uL (ref 0.7–4.0)
MCH: 32.6 pg (ref 26.0–34.0)
MCHC: 34 g/dL (ref 30.0–36.0)
MCV: 95.7 fL (ref 80.0–100.0)
Monocytes Absolute: 0.8 10*3/uL (ref 0.1–1.0)
Monocytes Relative: 12 %
Neutro Abs: 5.1 10*3/uL (ref 1.7–7.7)
Neutrophils Relative %: 72 %
Platelet Count: 336 10*3/uL (ref 150–400)
RBC: 3.96 MIL/uL (ref 3.87–5.11)
RDW: 13.2 % (ref 11.5–15.5)
WBC Count: 7.1 10*3/uL (ref 4.0–10.5)
nRBC: 0 % (ref 0.0–0.2)

## 2022-11-05 MED ORDER — MEGESTROL ACETATE 40 MG PO TABS: 40.0000 mg | ORAL_TABLET | Freq: Two times a day (BID) | ORAL | 1 refills | Status: DC

## 2022-11-05 NOTE — Progress Notes (Signed)
[facility] OFFICE PROGRESS NOTE  Patient Care Team: Pcp, No as PCP - General Adam Phenix, MD as Consulting Physician (Obstetrics and Gynecology) Nyoka Cowden, MD as Consulting Physician (Pulmonary Disease) Loreli Slot, MD as Consulting Physician (Cardiothoracic Surgery) Comer, Belia Heman, MD as Consulting Physician (Infectious Diseases)  ASSESSMENT & PLAN:  Hodgkin lymphoma, nodular sclerosis (HCC) She has no clinical signs or symptoms of recurrence In the absence of symptoms, I do not feel strongly we need to order serial surveillance imaging studies I plan to see her again in 6 months for further follow-up The patient is educated to watch for signs and symptoms of cancer recurrence  Weight loss She has significant weight loss, part of it is due to grieving process but 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   All questions were answered. The patient knows to call the clinic with any problems, questions or concerns. The total time spent in the appointment was 20 minutes encounter with patients including review of chart and various tests results, discussions about plan of care and coordination of care plan   Artis Delay, MD 11/05/2022 9:02 AM  INTERVAL HISTORY: Please see below for problem oriented charting. 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 His mother has recently relocated to [place] and she is lonely She is not in any kind of relationship right  now She has lost weight Denies new lymphadenopathy No recent infection, fever or chills She is compliant taking all her medications   REVIEW OF SYSTEMS:   Constitutional: Denies fevers, chills  Eyes: Denies blurriness of vision Ears, nose, mouth, throat, and face: Denies mucositis or sore throat Respiratory: Denies cough, dyspnea or wheezes Cardiovascular: Denies palpitation, chest discomfort or lower extremity swelling Gastrointestinal:  Denies nausea, heartburn or change in bowel habits Skin: Denies abnormal skin rashes Lymphatics: Denies new lymphadenopathy or easy bruising Neurological:Denies numbness, tingling or new weaknesses Behavioral/Psych: Mood is stable, no new changes  All other systems were reviewed with the patient and are negative.  I have reviewed the past medical history, past surgical history, social history and family history with the patient and they are unchanged from previous note.  ALLERGIES:  is allergic to temazepam.  MEDICATIONS:  Current Outpatient Medications  Medication Sig Dispense Refill   megestrol (MEGACE) 40 MG tablet Take 1 tablet (40 mg total) by mouth 2 (two) times daily. 60 tablet 1   BIKTARVY 50-200-25 MG TABS tablet TAKE 1 TABLET BY MOUTH DAILY 30 tablet 5   cholecalciferol (VITAMIN D) 1000 units tablet Take 1 tablet (1,000 Units total) by mouth daily. 30 tablet 9   dronabinol (MARINOL) 5 MG capsule Take 1 capsule (5 mg total) by mouth 2 (two) times daily before a meal. 60 capsule 5   hydrOXYzine (ATARAX) 10 MG tablet TAKE 1 TABLET(10 MG) BY MOUTH THREE TIMES DAILY AS NEEDED 90 tablet 0   traMADol (ULTRAM) 50 MG tablet Take 2 tablets (100 mg total) by mouth every 6 (six) hours as needed. 90 tablet  0   No current facility-administered medications for this visit.    SUMMARY OF ONCOLOGIC HISTORY: Oncology History  Hodgkin lymphoma, nodular sclerosis (HCC)  05/06/2014 Imaging   CT scan of the abdomen show diffuse mesenteric  lymphadenopathy.   05/07/2014 Imaging   CT scan of the chest show right thoracic inlet lymphadenopathy   06/07/2014 Procedure   She underwent ultrasound-guided core biopsy of the neck lymph node   06/07/2014 Pathology Results   Accession: [id number] biopsy confirmed diagnosis of Hodgkin lymphoma.   06/15/2014 Imaging   Echocardiogram showed preserved ejection fraction   07/09/2014 - 07/12/2014 Hospital Admission   She was admitted to the hospital for severe anemia.   07/27/2014 Procedure   She had placement of port   07/31/2014 - 09/11/2014 Chemotherapy   She received dose adjusted chemotherapy due to abnormal liver function tests and severe anemia. Treatment was delayed due to noncompliance  and subsequently stopped because the patient failed to keep appointments   01/11/2015 Imaging   Repeat PET CT scan showed response to treatment   01/28/2015 - 06/18/2015 Chemotherapy   ABVD was restarted with full dose.   02/08/2015 - 02/10/2015 Hospital Admission   The patient was admitted to the hospital due to pancytopenia and profuse diarrhea. Cultures were negative. She was placed on ciprofloxacin.   02/11/2015 Adverse Reaction   Treatment was placed on hold due to recent infection.   04/11/2015 Imaging   PET CT scan showed near complete response. Incidental finding of an abnormal bone lesion, indeterminate. She is not symptomatic. Recommendation from Hem [facility] to observe   07/11/2015 Imaging   PET CT scan showed abnormal new bone lesions, suggestive of possible disease progression   07/23/2015 Bone Marrow Biopsy   She underwent bone biopsy   07/23/2015 Pathology Results   Accession: [id number]  biopsy was negative for cancer   11/21/2015 Surgery   She had surgery for ectopic pregnancy   01/29/2016 Imaging   Ct chest, abdomen and pelvis showed pelvic and retroperitoneal lymphadenopathy, as above, concerning for residual disease. There is also a mildly enlarged posterior mediastinal lymph node  measuring 11 mm adjacent to the distal descending thoracic aorta. This may represent an additional focus of disease, but is the only finding of concern in the thorax on today's examination. Sclerosis in the right ilium at site of previously noted metabolically active lesion, grossly unchanged. No other definite osseous lesions are identified on today's examination. Spleen is normal in size and appearance.   02/14/2016 PET scan   Interval disease worsening with new foci of hypermetabolic activity in multiple retroperitoneal and pelvic lymph nodes as well as AP window and left hilar lymph nodes. (Deauville 5). There is also overall worsening of the osseous disease.   05/11/2016 Pathology Results   Diagnosis Lymph node, needle/core biopsy, Left para-aortic retroperitoneal - CLASSICAL HODGKIN LYMPHOMA. - SEE ONCOLOGY TABLE. Microscopic Comment LYMPHOMA Histologic type: Classical Hodgkin lymphoma. Grade (if applicable): N/A Flow cytometry: Not done. Immunohistochemical stains: CD15, CD20, CD3, LCA, PAX-5, CD30 with appropriate controls. Touch preps/imprints: Not performed. Comments: The sections show small needle core biopsy fragments displaying a polymorphous cellular proliferation of small lymphocytes, plasma cells, eosinophils, and large atypical mononuclear and multilobated lymphoid cells with features of Reed-Sternberg cells and variants. This is associated with patchy areas of fibrosis. Immunohistochemical stains were performed and show that the large atypical lymphoid cells are positive for CD30, CD15 and PAX-5 and negative for LCA, CD20, CD3. The small lymphoid cells in the background show a  mixture of T and B cells with predominance of T cells. The overall morphologic and histologic features are consistent with classical Hodgkin lymphoma. Further subtyping is challenging in limited small biopsy fragments but the patchy fibrosis suggests nodular sclerosis type.    05/11/2016 Procedure   She  underwent CT guided biopsy of retroperitoneal lymph node   05/26/2016 Procedure   Successful placement of a right IJ approach Power Port with ultrasound and fluoroscopic guidance. The catheter is ready for use.   06/02/2016 PET scan   Mixed response to chemotherapy with some lymph nodes decreased in metabolic activity and some lymph nodes increase metabolic activity. Lymph node stations including mediastinum, periaortic retroperitoneum, and obturator node stations. Activity is remains relatively intense Deauville 4 2. LEFT infrahilar nodule / lymph node with intense metabolic activity decreased from prior. ( Deauville 4 ). 3. 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 Deauville 5   06/03/2016 - 06/05/2016 Hospital Admission   She was admitted to the hospital for cycle 1 of ICE chemotherapy   06/24/2016 - 06/26/2016 Hospital Admission   She received cycle 2 of ICE chemo   07/07/2016 PET scan   Resolution of prior hypermetabolic adenopathy and resolution of prior osseous foci of hypermetabolic activity compatible with essentially complete response to therapy (Deauville 1). 2. Generalized reduced activity in the L4 vertebral body and the type of finding which would typically reflect prior radiation therapy. 3. Stable septated fatty right pelvic lesion, possibly a dermoid or lipoma, not hypermetabolic   10/12/2016 PET scan   Hypermetabolic lesion along the L3 vertebral body and left posterior elements, max SUV 7.8 (Deauville 5). Hypermetabolic lesion along the left inferior pubic ramus, max SUV 4.9 (Deauville 4).  IMPRESSION: Prevascular lymphadenopathy, reflecting nodal recurrence (Deauville 4). Hypermetabolic osseous metastases involving the L3 vertebral body/posterior elements and left inferior pubic ramus (Deauville 4-5). Hypermetabolism along the endometrium, new, possibly reactive/physiologic. Consider pelvic ultrasound and/or endometrial sampling as clinically warranted.    11/04/2016 - 02/24/2017 Chemotherapy   She received Brentuximab   12/11/2016 PET scan   1. Mixed response to therapy within the skeleton. Lesions at L3 is decreased in size and metabolic activity. Residual activity remains above liver ( Deauville 4) 2. Increased activity in the LEFT sacrum with metabolic activity above liver activity ( Deauville 4 3. Decrease in size and metabolic activity of anterior mediastinal tissue consistent with resolution of thymic hyperplasia or resolution of lymphoma metabolic activity ( Deauville 2). 4. No new lymphadenopathy.  Normal spleen and liver.   03/25/2017 PET scan   1. Increased metabolic activity in several normal sized retroperitoneal and right pelvic lymph nodes, Deauville 4. 2. Increase in size and metabolic activity of lesions in the L3 and L4 vertebral bodies and in the left sacrum, Deauville 5. 3. No recurrence of anterior mediastinal abnormal activity. 4. Other imaging findings of potential clinical significance: Chronic ethmoid and left maxillary sinusitis. Paraseptal emphysema at the lung apices. Right ovarian dermoid.   04/06/2017 -  Chemotherapy   The patient had Keytruda   04/06/2017 - 05/23/2019 Chemotherapy   The patient had pembrolizumab (KEYTRUDA) 200 mg in sodium chloride 0.9 % 50 mL chemo infusion, 200 mg, Intravenous, Once, 30 of 31 cycles Administration: 200 mg (04/06/2017), 200 mg (04/29/2017), 200 mg (05/20/2017), 200 mg (06/10/2017), 200 mg (07/01/2017), 200 mg (07/22/2017), 200 mg (08/12/2017), 200 mg (09/02/2017), 200 mg (10/04/2017), 200 mg (11/03/2017), 200 mg (12/02/2017), 200 mg (12/30/2017), 200 mg (01/27/2018), 200 mg (02/24/2018), 200 mg (03/22/2018), 200  mg (04/19/2018), 200 mg (05/17/2018), 200 mg (06/14/2018), 200 mg (07/12/2018), 200 mg (08/09/2018), 200 mg (09/06/2018), 200 mg (10/04/2018), 200 mg (11/01/2018), 200 mg (11/29/2018), 200 mg (12/27/2018), 200 mg (01/24/2019), 200 mg (03/14/2019), 200 mg (05/23/2019)  for chemotherapy treatment.     06/09/2017 PET scan   1. Overall significant improvement with reduction in nodal activity. Several retroperitoneal nodes which were previously Deauville 4 are currently Deauville 3. The right common iliac lymph node remains at Deauville 4 with maximum SUV of 3.4, but is improved from prior SUV of 4.3. 2. The bony lesions show the greatest improvement, with the previously Deauville 5 hypermetabolic lesions currently no longer of higher metabolic activity than surrounding bone marrow, currently measuring at Deauville 3. 3. 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 4. No new lesions are identified. 5.  Emphysema (ICD10-J43.9).   08/05/2017 Imaging   1. Somewhat complex left adnexal lesion, potentially hemorrhagic cyst measuring 2.9 x 2.8 cm. The possibility of torsion in the left ovary must be considered. Advise correlation with pelvic ultrasound including Doppler assessment to further evaluate.   2.  Right ovarian dermoid, unchanged from recent PET-CT examination.   3. No adenopathy by size criteria evident. Appearance of subcentimeter mesenteric lymph nodes is stable compared to recent study.   4. No bone lesions appreciable by CT. Areas of abnormal radiotracer uptake in the mid lumbar spine noted on recent PET-CT examination. No destruction 2 or lytic change noted in the lumbar vertebrae currently there.   5. No bowel obstruction. No abscess. No periappendiceal region inflammation.   6.  Small hiatal hernia with fluid in distal esophagus.   7.  Foci of coronary artery calcification, advanced for age.   8.  Spleen normal in size and contour.   9. Small hiatal hernia with mild fluid in the distal esophagus, likely indicative of a degree of reflux.   01/04/2018 Imaging   1. No findings to suggest residual/recurrent lymphoma in the chest, abdomen or pelvis. 2. Right ovarian dermoid slightly larger than prior examinations, currently measuring 3.8 x 2.8  x 3.1 cm. 3. Additional incidental findings, as above.   07/11/2018 Imaging   No findings suspicious for recurrent lymphoma. Spleen is normal in size.   Stable right ovarian dermoid.   4 mm subpleural nodular opacity in the posterior right lower lobe, favoring subpleural atelectasis.   07/17/2019 Imaging   1. Stable exam. No new or progressive findings to suggest recurrent disease. 2. Stable right adnexal dermoid. 3. Emphysema (ICD10-J43.9).   09/19/2019 Procedure   Successful right IJ vein Port-A-Cath explant.   12/05/2019 Imaging   1. Stable exam. No findings to suggest recurrent lymphoma. 2. 9 mm left para-aortic node is upper normal for size and mildly increased from 7 mm previously. Attention on follow-up recommended.  3. Stable right adnexal dermoid. 4. Emphysema (ICD10-J43.9).       PHYSICAL EXAMINATION: ECOG PERFORMANCE STATUS: 1 - Symptomatic but completely ambulatory  Vitals:   11/05/22 0836  BP: 113/72  Pulse: 79  Resp: 18  SpO2: 100%   Filed Weights   11/05/22 0836  Weight: 107 lb 12.8 oz (48.9 kg)    GENERAL:alert, no distress and comfortable SKIN: skin color, texture, turgor are normal, no rashes or significant lesions EYES: normal, Conjunctiva are pink and non-injected, sclera clear OROPHARYNX:no exudate, no erythema and lips, buccal mucosa, and tongue normal  NECK: supple, thyroid normal size, non-tender, without nodularity LYMPH:  no palpable lymphadenopathy in the cervical, axillary  or inguinal LUNGS: clear to auscultation and percussion with normal breathing effort HEART: regular rate & rhythm and no murmurs and no lower extremity edema ABDOMEN:abdomen soft, non-tender and normal bowel sounds Musculoskeletal:no cyanosis of digits and no clubbing  NEURO: alert & oriented x 3 with fluent speech, no focal motor/sensory deficits  LABORATORY DATA:  I have reviewed the data as listed    Component Value Date/Time   NA 138 11/05/2022 0822   NA 140  04/29/2017 0745   K 3.7 11/05/2022 0822   K 3.3 (L) 04/29/2017 0745   CL 103 11/05/2022 0822   CO2 29 11/05/2022 0822   CO2 23 04/29/2017 0745   GLUCOSE 84 11/05/2022 0822   GLUCOSE 83 04/29/2017 0745   BUN 11 11/05/2022 0822   BUN 10.7 04/29/2017 0745   CREATININE 0.97 11/05/2022 0822   CREATININE 1.2 (H) 04/29/2017 0745   CALCIUM 9.7 11/05/2022 0822   CALCIUM 9.8 04/29/2017 0745   PROT 7.9 11/05/2022 0822   PROT 7.6 04/29/2017 0745   ALBUMIN 3.7 11/05/2022 0822   ALBUMIN 4.0 04/29/2017 0745   AST 19 11/05/2022 0822   AST 19 04/29/2017 0745   ALT 21 11/05/2022 0822   ALT 19 04/29/2017 0745   ALKPHOS 79 11/05/2022 0822   ALKPHOS 81 04/29/2017 0745   BILITOT 0.4 11/05/2022 0822   BILITOT 1.31 (H) 04/29/2017 0745   GFRNONAA >60 11/05/2022 0822   GFRNONAA 83 02/20/2016 0908   GFRAA >60 11/07/2019 0740   GFRAA >89 02/20/2016 0908    No results found for: "SPEP", "UPEP"  Lab Results  Component Value Date   WBC 7.1 11/05/2022   NEUTROABS 5.1 11/05/2022   HGB 12.9 11/05/2022   HCT 37.9 11/05/2022   MCV 95.7 11/05/2022   PLT 336 11/05/2022      Chemistry      Component Value Date/Time   NA 138 11/05/2022 0822   NA 140 04/29/2017 0745   K 3.7 11/05/2022 0822   K 3.3 (L) 04/29/2017 0745   CL 103 11/05/2022 0822   CO2 29 11/05/2022 0822   CO2 23 04/29/2017 0745   BUN 11 11/05/2022 0822   BUN 10.7 04/29/2017 0745   CREATININE 0.97 11/05/2022 0822   CREATININE 1.2 (H) 04/29/2017 0745      Component Value Date/Time   CALCIUM 9.7 11/05/2022 0822   CALCIUM 9.8 04/29/2017 0745   ALKPHOS 79 11/05/2022 0822   ALKPHOS 81 04/29/2017 0745   AST 19 11/05/2022 0822   AST 19 04/29/2017 0745   ALT 21 11/05/2022 0822   ALT 19 04/29/2017 0745   BILITOT 0.4 11/05/2022 0822   BILITOT 1.31 (H) 04/29/2017 0745

## 2022-11-05 NOTE — Assessment & Plan Note (Signed)
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

## 2022-11-05 NOTE — Assessment & Plan Note (Signed)
She has no clinical signs or symptoms of recurrence In the absence of symptoms, I do not feel strongly we need to order serial surveillance imaging studies I plan to see her again in 6 months for further follow-up The patient is educated to watch for signs and symptoms of cancer recurrence

## 2022-11-05 NOTE — Assessment & Plan Note (Signed)
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

## 2022-11-06 ENCOUNTER — Other Ambulatory Visit: Payer: Self-pay | Admitting: Hematology and Oncology

## 2022-11-06 ENCOUNTER — Telehealth: Payer: Self-pay | Admitting: *Deleted

## 2022-11-06 MED ORDER — MIRTAZAPINE 15 MG PO TABS: 15.0000 mg | ORAL_TABLET | Freq: Every day | ORAL | 1 refills | Status: DC

## 2022-11-06 NOTE — Telephone Encounter (Signed)
Confirmed pharmacy with patient- [facility] on [address]. Patient verbalized an understanding of new medication and directions.

## 2022-11-06 NOTE — Telephone Encounter (Signed)
She has 2 pharmacies listed I canceled megace off her med list Can you e scribe remeron 15 mg at bedtime to her pharmacy, 30 tabs with 1 refills

## 2022-11-06 NOTE — Telephone Encounter (Signed)
Patient called to report she started taking megestrol yesterday as prescribed- took 3 total doses. She does not feel good. She has been nauseated, dizzy and SOB at times. She is leaving work today because of how poorly she feels.   She does not want to continue this prescription. She has been advised to stop taking this medication. She still would like to try something to increase her appetite and gain weight.

## 2022-11-06 NOTE — Addendum Note (Signed)
Addended by: Elease Etienne A on: 11/06/2022 01:39 PM   Modules accepted: Orders

## 2022-11-12 ENCOUNTER — Other Ambulatory Visit: Payer: Self-pay | Admitting: Hematology and Oncology

## 2022-11-12 ENCOUNTER — Telehealth: Payer: Self-pay

## 2022-11-12 DIAGNOSIS — B20 Human immunodeficiency virus [HIV] disease: Secondary | ICD-10-CM

## 2022-11-12 MED ORDER — TRAMADOL HCL 50 MG PO TABS
100.0000 mg | ORAL_TABLET | Freq: Four times a day (QID) | ORAL | 0 refills | Status: DC | PRN
Start: 2022-11-12 — End: 2022-11-24

## 2022-11-12 NOTE — Telephone Encounter (Signed)
She called and left a message requesting tramadol refill to [facility] on [address].

## 2022-11-12 NOTE — Telephone Encounter (Signed)
done

## 2022-11-12 NOTE — Telephone Encounter (Signed)
Called and given message Rx sent. She verbalized understanding.

## 2022-11-24 ENCOUNTER — Telehealth: Payer: Self-pay

## 2022-11-24 ENCOUNTER — Other Ambulatory Visit: Payer: Self-pay | Admitting: Hematology and Oncology

## 2022-11-24 DIAGNOSIS — B20 Human immunodeficiency virus [HIV] disease: Secondary | ICD-10-CM

## 2022-11-24 MED ORDER — TRAMADOL HCL 50 MG PO TABS
100.0000 mg | ORAL_TABLET | Freq: Four times a day (QID) | ORAL | 0 refills | Status: DC | PRN
Start: 2022-11-24 — End: 2022-12-07

## 2022-11-24 NOTE — Telephone Encounter (Signed)
Called and left her a message that Rx sent to her pharmacy.

## 2022-11-24 NOTE — Telephone Encounter (Signed)
She called and requested a tramadol refill to [facility] on [address], please.

## 2022-11-24 NOTE — Telephone Encounter (Signed)
done

## 2022-11-25 ENCOUNTER — Ambulatory Visit: Payer: Medicaid Other | Admitting: Internal Medicine

## 2022-12-07 ENCOUNTER — Other Ambulatory Visit: Payer: Self-pay | Admitting: Hematology and Oncology

## 2022-12-07 ENCOUNTER — Telehealth: Payer: Self-pay

## 2022-12-07 DIAGNOSIS — B20 Human immunodeficiency virus [HIV] disease: Secondary | ICD-10-CM

## 2022-12-07 MED ORDER — TRAMADOL HCL 50 MG PO TABS
100.0000 mg | ORAL_TABLET | Freq: Four times a day (QID) | ORAL | 0 refills | Status: DC | PRN
Start: 2022-12-07 — End: 2022-12-18

## 2022-12-07 NOTE — Telephone Encounter (Signed)
Called and told Rx sent. She verbalized understanding.

## 2022-12-07 NOTE — Telephone Encounter (Signed)
She called and left a message requesting Tramadol to [facility] on [address] please.

## 2022-12-07 NOTE — Telephone Encounter (Signed)
done

## 2022-12-18 ENCOUNTER — Telehealth: Payer: Self-pay

## 2022-12-18 ENCOUNTER — Other Ambulatory Visit: Payer: Self-pay | Admitting: Hematology and Oncology

## 2022-12-18 DIAGNOSIS — B20 Human immunodeficiency virus [HIV] disease: Secondary | ICD-10-CM

## 2022-12-18 MED ORDER — TRAMADOL HCL 50 MG PO TABS
100.0000 mg | ORAL_TABLET | Freq: Four times a day (QID) | ORAL | 0 refills | Status: DC | PRN
Start: 2022-12-18 — End: 2022-12-29

## 2022-12-18 NOTE — Telephone Encounter (Signed)
She called and left a message requesting Tramadol Rx to [facility] on [address] please.

## 2022-12-18 NOTE — Telephone Encounter (Signed)
done

## 2022-12-18 NOTE — Telephone Encounter (Signed)
Called and told Rx sent. She verbalized understanding and she appreciated the call.

## 2022-12-23 ENCOUNTER — Other Ambulatory Visit: Payer: Self-pay

## 2022-12-23 ENCOUNTER — Ambulatory Visit: Payer: 59 | Admitting: Internal Medicine

## 2022-12-23 ENCOUNTER — Other Ambulatory Visit (HOSPITAL_COMMUNITY)
Admission: RE | Admit: 2022-12-23 | Discharge: 2022-12-23 | Disposition: A | Discharge status: Home / Self Care | Payer: 59 | Source: Ambulatory Visit | Attending: Internal Medicine | Admitting: Internal Medicine

## 2022-12-23 ENCOUNTER — Encounter: Payer: Self-pay | Admitting: Internal Medicine

## 2022-12-23 VITALS — BP 95/61 | HR 79 | Temp 98.4°F | Ht 64.0 in | Wt 108.0 lb

## 2022-12-23 DIAGNOSIS — Z113 Encounter for screening for infections with a predominantly sexual mode of transmission: Secondary | ICD-10-CM

## 2022-12-23 DIAGNOSIS — B20 Human immunodeficiency virus [HIV] disease: Secondary | ICD-10-CM | POA: Insufficient documentation

## 2022-12-23 DIAGNOSIS — R636 Underweight: Secondary | ICD-10-CM

## 2022-12-23 DIAGNOSIS — Z124 Encounter for screening for malignant neoplasm of cervix: Secondary | ICD-10-CM

## 2022-12-23 LAB — URINE CYTOLOGY ANCILLARY ONLY
Chlamydia: NEGATIVE
Comment: NEGATIVE
Comment: NORMAL
Neisseria Gonorrhea: NEGATIVE

## 2022-12-23 MED ORDER — BIKTARVY 50-200-25 MG PO TABS
1.0000 | ORAL_TABLET | Freq: Every day | ORAL | 11 refills | Status: DC
Start: 2022-12-23 — End: 2023-12-15

## 2022-12-23 NOTE — Assessment & Plan Note (Signed)
Screened positive and went to gynecology.  No issues found She will continue with follow up with them

## 2022-12-23 NOTE — Progress Notes (Signed)
   Subjective:    Patient ID: [name], female    DOB: 1985/09/08, 37 y.o.   MRN: [id number]  HPI [name] is here for follow up of HIV She contineus on Biktarvy with no missed doses.  She saw gynecology and underwent a procedure (colposcopy presumably) and was negative by her report.  Has lost some weight due to stress and not eating.  Starting to gain some back.     Review of Systems  Constitutional:  Negative for fatigue.  Gastrointestinal:  Negative for diarrhea.  Skin:  Negative for rash.       Objective:   Physical Exam Eyes:     General: No scleral icterus. Pulmonary:     Effort: Pulmonary effort is normal.  Neurological:     Mental Status: She is alert.   SH: + tobacco        Assessment & Plan:

## 2022-12-23 NOTE — Assessment & Plan Note (Signed)
Some weight loss and continues to be underweight.  Encouraged good eating/caloric intake.

## 2022-12-23 NOTE — Assessment & Plan Note (Signed)
She continues to do well and will check labs today.  No new concerns.  Follow up in 6 months.

## 2022-12-24 LAB — T-HELPER CELL (CD4) - (RCID CLINIC ONLY)
CD4 % Helper T Cell: 39 % (ref 33–65)
CD4 T Cell Abs: 361 /uL — ABNORMAL LOW (ref 400–1790)

## 2022-12-26 LAB — HIV-1 RNA QUANT-NO REFLEX-BLD
HIV 1 RNA Quant: 48 {copies}/mL — ABNORMAL HIGH
HIV-1 RNA Quant, Log: 1.68 {Log_copies}/mL — ABNORMAL HIGH

## 2022-12-26 LAB — RPR: RPR Ser Ql: NONREACTIVE

## 2022-12-29 ENCOUNTER — Telehealth: Payer: Self-pay

## 2022-12-29 ENCOUNTER — Other Ambulatory Visit: Payer: Self-pay | Admitting: Hematology and Oncology

## 2022-12-29 DIAGNOSIS — B20 Human immunodeficiency virus [HIV] disease: Secondary | ICD-10-CM

## 2022-12-29 MED ORDER — TRAMADOL HCL 50 MG PO TABS: 100.0000 mg | ORAL_TABLET | Freq: Four times a day (QID) | ORAL | 0 refills | Status: DC | PRN

## 2022-12-29 NOTE — Telephone Encounter (Signed)
Called and left below message that the Rx was sent.

## 2022-12-29 NOTE — Telephone Encounter (Signed)
done

## 2022-12-29 NOTE — Telephone Encounter (Signed)
She called and left a message requesting tramadol refill to [facility].

## 2023-01-08 ENCOUNTER — Other Ambulatory Visit: Payer: Self-pay | Admitting: Hematology and Oncology

## 2023-01-08 ENCOUNTER — Telehealth: Payer: Self-pay

## 2023-01-08 DIAGNOSIS — B20 Human immunodeficiency virus [HIV] disease: Secondary | ICD-10-CM

## 2023-01-08 MED ORDER — TRAMADOL HCL 50 MG PO TABS: 100.0000 mg | ORAL_TABLET | Freq: Four times a day (QID) | ORAL | 0 refills | Status: DC | PRN

## 2023-01-08 NOTE — Telephone Encounter (Signed)
She called and left a message requesting Tramadol refill to pharmacy please.

## 2023-01-08 NOTE — Telephone Encounter (Signed)
Called and left a message prescription sent. Ask her to call the office for questions.

## 2023-01-08 NOTE — Telephone Encounter (Signed)
done

## 2023-01-20 ENCOUNTER — Other Ambulatory Visit: Payer: Self-pay | Admitting: Hematology and Oncology

## 2023-01-20 ENCOUNTER — Telehealth: Payer: Self-pay

## 2023-01-20 DIAGNOSIS — B20 Human immunodeficiency virus [HIV] disease: Secondary | ICD-10-CM

## 2023-01-20 MED ORDER — TRAMADOL HCL 50 MG PO TABS
100.0000 mg | ORAL_TABLET | Freq: Four times a day (QID) | ORAL | 0 refills | Status: DC | PRN
Start: 2023-01-20 — End: 2023-02-05

## 2023-01-20 NOTE — Telephone Encounter (Signed)
Pt LVM requesting prescription refill. This RN called pt back to confirm which medication and location of pharmacy. Pt states that she is wanting tramadol sent to [facility] off of [address]. This RN made Dr. Bertis Ruddy aware.

## 2023-01-25 ENCOUNTER — Ambulatory Visit (HOSPITAL_COMMUNITY)
Admission: EM | Admit: 2023-01-25 | Discharge: 2023-01-25 | Disposition: A | Discharge status: Home / Self Care | Payer: 59 | Attending: Physician Assistant | Admitting: Physician Assistant

## 2023-01-25 ENCOUNTER — Encounter (HOSPITAL_COMMUNITY): Payer: Self-pay | Admitting: Emergency Medicine

## 2023-01-25 ENCOUNTER — Ambulatory Visit (HOSPITAL_COMMUNITY): Payer: 59

## 2023-01-25 DIAGNOSIS — M62838 Other muscle spasm: Secondary | ICD-10-CM | POA: Diagnosis not present

## 2023-01-25 DIAGNOSIS — R0789 Other chest pain: Secondary | ICD-10-CM | POA: Diagnosis not present

## 2023-01-25 DIAGNOSIS — M542 Cervicalgia: Secondary | ICD-10-CM

## 2023-01-25 MED ORDER — PREDNISONE 20 MG PO TABS
40.0000 mg | ORAL_TABLET | Freq: Every day | ORAL | 0 refills | Status: AC
Start: 2023-01-25 — End: 2023-01-28

## 2023-01-25 MED ORDER — LIDOCAINE 5 % EX PTCH: 1.0000 | MEDICATED_PATCH | CUTANEOUS | 0 refills | Status: DC

## 2023-01-25 MED ORDER — BACLOFEN 10 MG PO TABS: 10.0000 mg | ORAL_TABLET | Freq: Two times a day (BID) | ORAL | 0 refills | Status: DC

## 2023-01-25 NOTE — Discharge Instructions (Signed)
I do not see any obvious abnormality on your x-ray but you do have some degenerative changes.  I will contact you if the radiologist sees something else and we need to change your treatment plan.  Start baclofen twice daily.  This will make you sleepy so do not drive or drink alcohol with taking it.  Take prednisone 40 mg for 3 days.  This will help with inflammation.  Do not take NSAIDs including aspirin, ibuprofen/Advil, naproxen/Aleve.  Use lidocaine patches for specific bothersome area.  Use heat and gentle stretch.  If your symptoms are not improving please follow-up with sports medicine.  If anything worsens and you have increasing pain, numbness or tingling in your arms, shortness of breath, weakness you need to be seen immediately.

## 2023-01-25 NOTE — ED Triage Notes (Addendum)
Pt reports pain in her chest x 3 days. States she had pain in her neck then the pain radiated into left shoulder and chest after significant other massaged the area. States she was lifting heavy objects at work on Friday and that's when the pain began. Has been taking tramadol for pain with no relief.
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
## 2023-02-05 ENCOUNTER — Other Ambulatory Visit: Payer: Self-pay | Admitting: Hematology and Oncology

## 2023-02-05 ENCOUNTER — Telehealth: Payer: Self-pay

## 2023-02-05 DIAGNOSIS — B20 Human immunodeficiency virus [HIV] disease: Secondary | ICD-10-CM

## 2023-02-05 MED ORDER — TRAMADOL HCL 50 MG PO TABS: 100.0000 mg | ORAL_TABLET | Freq: Four times a day (QID) | ORAL | 0 refills | Status: DC | PRN

## 2023-02-05 NOTE — Telephone Encounter (Signed)
done

## 2023-02-05 NOTE — Telephone Encounter (Signed)
She called and left a message requesting Tramadol to [facility] on [address] please.

## 2023-02-05 NOTE — Telephone Encounter (Signed)
Called and left below message. Ask her to call the office back for questions. 

## 2023-02-14 ENCOUNTER — Other Ambulatory Visit: Payer: Self-pay

## 2023-02-14 ENCOUNTER — Emergency Department (HOSPITAL_COMMUNITY)
Admission: EM | Admit: 2023-02-14 | Discharge: 2023-02-14 | Disposition: A | Discharge status: Home / Self Care | Payer: 59 | Attending: Emergency Medicine | Admitting: Emergency Medicine

## 2023-02-14 ENCOUNTER — Encounter (HOSPITAL_COMMUNITY): Payer: Self-pay

## 2023-02-14 DIAGNOSIS — Z21 Asymptomatic human immunodeficiency virus [HIV] infection status: Secondary | ICD-10-CM | POA: Insufficient documentation

## 2023-02-14 DIAGNOSIS — L0291 Cutaneous abscess, unspecified: Secondary | ICD-10-CM

## 2023-02-14 DIAGNOSIS — Z23 Encounter for immunization: Secondary | ICD-10-CM | POA: Insufficient documentation

## 2023-02-14 DIAGNOSIS — L02811 Cutaneous abscess of head [any part, except face]: Secondary | ICD-10-CM | POA: Diagnosis present

## 2023-02-14 MED ORDER — TETANUS-DIPHTH-ACELL PERTUSSIS 5-2.5-18.5 LF-MCG/0.5 IM SUSY
0.5000 mL | PREFILLED_SYRINGE | Freq: Once | INTRAMUSCULAR | Status: AC
  Administered 2023-02-14: 0.5 mL via INTRAMUSCULAR
  Filled 2023-02-14: qty 0.5

## 2023-02-14 MED ORDER — DOXYCYCLINE HYCLATE 100 MG PO CAPS: 100.0000 mg | ORAL_CAPSULE | Freq: Two times a day (BID) | ORAL | 0 refills | Status: AC

## 2023-02-14 MED ORDER — DOXYCYCLINE HYCLATE 100 MG PO TABS
100.0000 mg | ORAL_TABLET | Freq: Once | ORAL | Status: AC
  Administered 2023-02-14: 100 mg via ORAL
  Filled 2023-02-14: qty 1

## 2023-02-14 MED ORDER — LIDOCAINE-EPINEPHRINE (PF) 2 %-1:200000 IJ SOLN
20.0000 mL | Freq: Once | INTRAMUSCULAR | Status: AC
  Administered 2023-02-14: 20 mL
  Filled 2023-02-14: qty 20

## 2023-02-14 NOTE — ED Provider Notes (Signed)
[place] EMERGENCY DEPARTMENT AT [facility] Provider Note   CSN: [id number] Arrival date & time: 02/14/23  1118     History  Chief Complaint  Patient presents with   Cyst    [name] is a 37 y.o. female.  With a history of HIV, HSV, Hodgkin's lymphoma, anxiety, depression presenting to the ED for evaluation of an abscess.  She noticed this over her occiput approximately 1 week ago.  States that progressively gotten larger.  No drainage.  No headaches, fevers, nausea, vomiting.  HPI     Home Medications Prior to Admission medications   Medication Sig Start Date End Date Taking? Authorizing Provider  doxycycline (VIBRAMYCIN) 100 MG capsule Take 1 capsule (100 mg total) by mouth 2 (two) times daily for 7 days. 02/14/23 02/21/23 Yes Sander Speckman, Edsel Petrin, PA-C  baclofen (LIORESAL) 10 MG tablet Take 1 tablet (10 mg total) by mouth 2 (two) times daily. 01/25/23   Raspet, Noberto Retort, PA-C  bictegravir-emtricitabine-tenofovir AF (BIKTARVY) 50-200-25 MG TABS tablet Take 1 tablet by mouth daily. 12/23/22   Gardiner Barefoot, MD  Brimonidine Tartrate (LUMIFY) 0.025 % SOLN Apply to eye.    [provider]  cholecalciferol (VITAMIN D) 1000 units tablet Take 1 tablet (1,000 Units total) by mouth daily. 06/05/16   Artis Delay, MD  dronabinol (MARINOL) 5 MG capsule Take 1 capsule (5 mg total) by mouth 2 (two) times daily before a meal. Patient not taking: Reported on 12/23/2022 11/27/21   Gardiner Barefoot, MD  hydrOXYzine (ATARAX) 10 MG tablet TAKE 1 TABLET(10 MG) BY MOUTH THREE TIMES DAILY AS NEEDED Patient not taking: Reported on 12/23/2022 08/25/22   Artis Delay, MD  lidocaine (LIDODERM) 5 % Place 1 patch onto the skin daily. Remove & Discard patch within 12 hours or as directed by MD 01/25/23   Raspet, Denny Peon K, PA-C  mirtazapine (REMERON) 15 MG tablet Take 1 tablet (15 mg total) by mouth at bedtime. Patient not taking: Reported on 12/23/2022 11/06/22   Artis Delay, MD  traMADol  (ULTRAM) 50 MG tablet Take 2 tablets (100 mg total) by mouth every 6 (six) hours as needed. 02/05/23   Artis Delay, MD      Allergies    Temazepam    Review of Systems   Review of Systems  Skin:  Positive for wound.  All other systems reviewed and are negative.   Physical Exam Updated Vital Signs BP 99/76 (BP Location: Right Arm)   Pulse 70   Temp 98.1 F (36.7 C) (Oral)   Resp 16   Ht 5\' 4"  (1.626 m)   Wt 49.9 kg   LMP 01/31/2023   SpO2 100%   BMI 18.88 kg/m  Physical Exam Vitals and nursing note reviewed.  Constitutional:      General: She is not in acute distress.    Appearance: Normal appearance. She is normal weight. She is not ill-appearing.  HENT:     Head: Normocephalic and atraumatic.  Neck:     Comments: 2 cm x 2 cm area of swelling with erythema and central fluctuance to the occiput consistent with an abscess Pulmonary:     Effort: Pulmonary effort is normal. No respiratory distress.  Abdominal:     General: Abdomen is flat.  Musculoskeletal:        General: Normal range of motion.     Cervical back: Neck supple.  Skin:    General: Skin is warm and dry.  Neurological:  Mental Status: She is alert and oriented to person, place, and time.  Psychiatric:        Mood and Affect: Mood normal.        Behavior: Behavior normal.     ED Results / Procedures / Treatments   Labs (all labs ordered are listed, but only abnormal results are displayed) Labs Reviewed - No data to display  EKG None  Radiology No results found.  Procedures .Marland KitchenIncision and Drainage  Date/Time: 02/14/2023 3:07 PM  Performed by: Michelle Piper, PA-C Authorized by: Michelle Piper, PA-C   Consent:    Consent obtained:  Verbal   Consent given by:  Patient   Risks discussed:  Bleeding, incomplete drainage, pain, damage to other organs and infection   Alternatives discussed:  No treatment Universal protocol:    Procedure explained and questions answered to  patient or proxy's satisfaction: yes     Relevant documents present and verified: yes     Required blood products, implants, devices, and special equipment available: yes     Site/side marked: yes     Immediately prior to procedure, a time out was called: yes     Patient identity confirmed:  Verbally with patient and arm band Location:    Type:  Abscess   Location:  Head   Head location:  Scalp Pre-procedure details:    Skin preparation:  Betadine and chlorhexidine with alcohol Anesthesia:    Anesthesia method:  Local infiltration   Local anesthetic:  Lidocaine 2% WITH epi Procedure type:    Complexity:  Simple Procedure details:    Incision types:  Single straight   Incision depth:  Subcutaneous   Wound management:  Irrigated with saline and extensive cleaning   Drainage:  Purulent and serosanguinous   Drainage amount:  Scant   Wound treatment:  Wound left open Post-procedure details:    Procedure completion:  Tolerated well, no immediate complications     Medications Ordered in ED Medications  doxycycline (VIBRA-TABS) tablet 100 mg (has no administration in time range)  lidocaine-EPINEPHrine (XYLOCAINE W/EPI) 2 %-1:200000 (PF) injection 20 mL (20 mLs Infiltration Given 02/14/23 1457)  Tdap (BOOSTRIX) injection 0.5 mL (0.5 mLs Intramuscular Given 02/14/23 1457)    ED Course/ Medical Decision Making/ A&P                                 Medical Decision Making Risk Prescription drug management.  This patient presents to the ED for concern of abscess, this involves an extensive number of treatment options, and is a complaint that carries with it a high risk of complications and morbidity.  The differential diagnosis includes abscess, cellulitis  My initial workup includes I&D  Additional history obtained from: Nursing notes from this visit.  Afebrile, hemodynamically stable.  37 year old female presents to the ED for evaluation of a growth to the occiput that she  noticed approximately 1 week ago.  It is tender to palpation.  She denies any headaches or signs or symptoms of systemic infection.  She does have a history of HIV and non-Hodgkin's lymphoma.  Abscess was incised and drained here in the emergency department.  She was given a first dose of doxycycline and sent a prescription for this due to her immunosuppressed status.  She was encouraged to follow-up with her primary care provider in 1 week for reevaluation of her symptoms.  She was given return precautions.  Stable at  discharge.  At this time there does not appear to be any evidence of an acute emergency medical condition and the patient appears stable for discharge with appropriate outpatient follow up. Diagnosis was discussed with patient who verbalizes understanding of care plan and is agreeable to discharge. I have discussed return precautions with patient who verbalizes understanding. Patient encouraged to follow-up with their PCP within 1 week. All questions answered.  Note: Portions of this report may have been transcribed using voice recognition software. Every effort was made to ensure accuracy; however, inadvertent computerized transcription errors may still be present.        Final Clinical Impression(s) / ED Diagnoses Final diagnoses:  Abscess    Rx / DC Orders ED Discharge Orders          Ordered    doxycycline (VIBRAMYCIN) 100 MG capsule  2 times daily        02/14/23 1504              Mora Bellman 02/14/23 1508    Wynetta Fines, MD 02/14/23 2117

## 2023-02-14 NOTE — Discharge Instructions (Addendum)
You have been seen today for your complaint of abscess. Your discharge medications include doxycycline. This is an antibiotic. You should take it as prescribed. You should take it for the entire duration of the prescription. This may cause an upset stomach. This is normal. You may take this with food. You may also eat yogurt to prevent diarrhea. Home care instructions are as follows:  Keep the area clean and dry.  Clean with warm soapy water once daily.  Do not submerge the incision Follow up with: Your primary care provider in 1 week for reevaluation Please seek immediate medical care if you develop any of the following symptoms: You have very bad pain. You make less pee (urine) than normal. At this time there does not appear to be the presence of an emergent medical condition, however there is always the potential for conditions to change. Please read and follow the below instructions.  Do not take your medicine if  develop an itchy rash, swelling in your mouth or lips, or difficulty breathing; call 911 and seek immediate emergency medical attention if this occurs.  You may review your lab tests and imaging results in their entirety on your MyChart account.  Please discuss all results of fully with your primary care provider and other specialist at your follow-up visit.  Note: Portions of this text may have been transcribed using voice recognition software. Every effort was made to ensure accuracy; however, inadvertent computerized transcription errors may still be present.

## 2023-02-14 NOTE — ED Triage Notes (Signed)
Patient is here for a knot on the back of her head. States she noticed it for about a week, has tried to treat it at home but not working. Denies any injuries to the head.

## 2023-02-19 ENCOUNTER — Telehealth: Payer: Self-pay

## 2023-02-19 ENCOUNTER — Other Ambulatory Visit: Payer: Self-pay | Admitting: Hematology and Oncology

## 2023-02-19 DIAGNOSIS — B20 Human immunodeficiency virus [HIV] disease: Secondary | ICD-10-CM

## 2023-02-19 MED ORDER — TRAMADOL HCL 50 MG PO TABS: 100.0000 mg | ORAL_TABLET | Freq: Four times a day (QID) | ORAL | 0 refills | Status: DC | PRN

## 2023-02-19 NOTE — Telephone Encounter (Signed)
Attempted to call back no answer and no voicemail.

## 2023-02-19 NOTE — Telephone Encounter (Signed)
[name] called and left a message requesting Tramadol refill to [facility] on [address].

## 2023-02-19 NOTE — Telephone Encounter (Signed)
done

## 2023-03-03 ENCOUNTER — Other Ambulatory Visit: Payer: Self-pay | Admitting: Hematology and Oncology

## 2023-03-03 ENCOUNTER — Telehealth: Payer: Self-pay

## 2023-03-03 DIAGNOSIS — B20 Human immunodeficiency virus [HIV] disease: Secondary | ICD-10-CM

## 2023-03-03 MED ORDER — TRAMADOL HCL 50 MG PO TABS: 100.0000 mg | ORAL_TABLET | Freq: Four times a day (QID) | ORAL | 0 refills | Status: DC | PRN

## 2023-03-03 NOTE — Telephone Encounter (Signed)
Pt called and requested refill for Tramadol 50 mg, 2 tabs PO Q6H PRN pain qty 90. Medication was last filled by Dr Bertis Ruddy 02/19/23. This request forwarded to MD. Pt request [facility] on [address].

## 2023-03-03 NOTE — Telephone Encounter (Signed)
done

## 2023-03-15 ENCOUNTER — Telehealth: Payer: Self-pay

## 2023-03-15 ENCOUNTER — Other Ambulatory Visit: Payer: Self-pay | Admitting: Physician Assistant

## 2023-03-15 MED ORDER — TRAMADOL HCL 50 MG PO TABS: 100.0000 mg | ORAL_TABLET | Freq: Four times a day (QID) | ORAL | 0 refills | Status: DC | PRN

## 2023-03-15 NOTE — Telephone Encounter (Signed)
Attempted to call back to tell Rx sent. No answer.

## 2023-03-15 NOTE — Telephone Encounter (Signed)
She called and left a message requesting Tramadol refill to [facility].

## 2023-03-15 NOTE — Progress Notes (Signed)
Prescription for Tramadol sent to patient's pharmacy per her request. I have reviewed the PDMP during this encounter.

## 2023-03-21 ENCOUNTER — Emergency Department (HOSPITAL_COMMUNITY): Payer: 59

## 2023-03-21 ENCOUNTER — Encounter (HOSPITAL_COMMUNITY): Payer: Self-pay

## 2023-03-21 ENCOUNTER — Other Ambulatory Visit: Payer: Self-pay

## 2023-03-21 ENCOUNTER — Emergency Department (HOSPITAL_COMMUNITY)
Admission: EM | Admit: 2023-03-21 | Discharge: 2023-03-21 | Disposition: A | Discharge status: Home / Self Care | Payer: 59 | Attending: Emergency Medicine | Admitting: Emergency Medicine

## 2023-03-21 DIAGNOSIS — R059 Cough, unspecified: Secondary | ICD-10-CM | POA: Insufficient documentation

## 2023-03-21 DIAGNOSIS — Z21 Asymptomatic human immunodeficiency virus [HIV] infection status: Secondary | ICD-10-CM | POA: Insufficient documentation

## 2023-03-21 DIAGNOSIS — J42 Unspecified chronic bronchitis: Secondary | ICD-10-CM | POA: Insufficient documentation

## 2023-03-21 DIAGNOSIS — J209 Acute bronchitis, unspecified: Secondary | ICD-10-CM | POA: Diagnosis not present

## 2023-03-21 DIAGNOSIS — C819A Hodgkin lymphoma, unspecified, in remission: Secondary | ICD-10-CM | POA: Insufficient documentation

## 2023-03-21 DIAGNOSIS — Z1152 Encounter for screening for COVID-19: Secondary | ICD-10-CM | POA: Insufficient documentation

## 2023-03-21 LAB — CBC
HCT: 36.2 % (ref 36.0–46.0)
Hemoglobin: 12 g/dL (ref 12.0–15.0)
MCH: 30.8 pg (ref 26.0–34.0)
MCHC: 33.1 g/dL (ref 30.0–36.0)
MCV: 92.8 fL (ref 80.0–100.0)
Platelets: 366 10*3/uL (ref 150–400)
RBC: 3.9 MIL/uL (ref 3.87–5.11)
RDW: 14.6 % (ref 11.5–15.5)
WBC: 6.3 10*3/uL (ref 4.0–10.5)
nRBC: 0 % (ref 0.0–0.2)

## 2023-03-21 LAB — BASIC METABOLIC PANEL
Anion gap: 10 (ref 5–15)
BUN: 10 mg/dL (ref 6–20)
CO2: 22 mmol/L (ref 22–32)
Calcium: 9.1 mg/dL (ref 8.9–10.3)
Chloride: 103 mmol/L (ref 98–111)
Creatinine, Ser: 0.81 mg/dL (ref 0.44–1.00)
GFR, Estimated: >60 mL/min (ref >60)
Glucose, Bld: 93 mg/dL (ref 70–99)
Potassium: 3.8 mmol/L (ref 3.5–5.1)
Sodium: 135 mmol/L (ref 135–145)

## 2023-03-21 LAB — RESP PANEL BY RT-PCR (RSV, FLU A&B, COVID)  RVPGX2
Influenza A by PCR: NEGATIVE
Influenza B by PCR: NEGATIVE
Resp Syncytial Virus by PCR: NEGATIVE
SARS Coronavirus 2 by RT PCR: NEGATIVE

## 2023-03-21 MED ORDER — AZITHROMYCIN 250 MG PO TABS: 250.0000 mg | ORAL_TABLET | Freq: Every day | ORAL | 0 refills | Status: DC

## 2023-03-21 MED ORDER — BENZONATATE 100 MG PO CAPS: 100.0000 mg | ORAL_CAPSULE | Freq: Three times a day (TID) | ORAL | 0 refills | Status: DC

## 2023-03-21 MED ORDER — PREDNISONE 20 MG PO TABS: 40.0000 mg | ORAL_TABLET | Freq: Every day | ORAL | 0 refills | Status: DC

## 2023-03-21 NOTE — ED Provider Notes (Signed)
[place] EMERGENCY DEPARTMENT AT [facility] Provider Note   CSN: [id number] Arrival date & time: 03/21/23  0940     History  Chief Complaint  Patient presents with   Cough    [name] is a 37 y.o. female with past medical history of HIV, Hodgkin's lymphoma in remission, chronic bronchitis reporting to emergency room today with 1 week of productive cough.  Patient feels chest discomfort taht is more of soreness and occurs when she is coughing and moving, intermittent and mild.  Patient reports feels like muscle soreness thinks it secondary to coughing.  Patient reports she was coughing this morning when she noticed small amount of blood in sputum.  No associated shortness of breath, no unilateral calf tenderness or swelling, no abdominal pain nausea vomiting or diarrhea. No history of blood clot, not on blood thinner.  No recent travel or prolonged period of immobility    Cough      Home Medications Prior to Admission medications   Medication Sig Start Date End Date Taking? Authorizing Provider  baclofen (LIORESAL) 10 MG tablet Take 1 tablet (10 mg total) by mouth 2 (two) times daily. 01/25/23   Raspet, Noberto Retort, PA-C  bictegravir-emtricitabine-tenofovir AF (BIKTARVY) 50-200-25 MG TABS tablet Take 1 tablet by mouth daily. 12/23/22   Gardiner Barefoot, MD  Brimonidine Tartrate (LUMIFY) 0.025 % SOLN Apply to eye.    [provider]  cholecalciferol (VITAMIN D) 1000 units tablet Take 1 tablet (1,000 Units total) by mouth daily. 06/05/16   Artis Delay, MD  dronabinol (MARINOL) 5 MG capsule Take 1 capsule (5 mg total) by mouth 2 (two) times daily before a meal. Patient not taking: Reported on 12/23/2022 11/27/21   Gardiner Barefoot, MD  hydrOXYzine (ATARAX) 10 MG tablet TAKE 1 TABLET(10 MG) BY MOUTH THREE TIMES DAILY AS NEEDED Patient not taking: Reported on 12/23/2022 08/25/22   Artis Delay, MD  lidocaine (LIDODERM) 5 % Place 1 patch onto the skin daily. Remove & Discard  patch within 12 hours or as directed by MD 01/25/23   Raspet, Denny Peon K, PA-C  mirtazapine (REMERON) 15 MG tablet Take 1 tablet (15 mg total) by mouth at bedtime. Patient not taking: Reported on 12/23/2022 11/06/22   Artis Delay, MD  traMADol (ULTRAM) 50 MG tablet Take 2 tablets (100 mg total) by mouth every 6 (six) hours as needed for severe pain (pain score 7-10). 03/15/23   Shanon Ace, PA-C      Allergies    Temazepam    Review of Systems   Review of Systems  Respiratory:  Positive for cough.     Physical Exam Updated Vital Signs BP 114/75 (BP Location: Left Arm)   Pulse 96   Temp 98 F (36.7 C) (Oral)   Resp 18   Ht 5\' 4"  (1.626 m)   Wt 49.9 kg   SpO2 97%   BMI 18.88 kg/m  Physical Exam Vitals and nursing note reviewed.  Constitutional:      General: She is not in acute distress.    Appearance: She is not toxic-appearing.  HENT:     Head: Normocephalic and atraumatic.  Eyes:     General: No scleral icterus.    Conjunctiva/sclera: Conjunctivae normal.  Cardiovascular:     Rate and Rhythm: Normal rate and regular rhythm.     Pulses: Normal pulses.     Heart sounds: Normal heart sounds.  Pulmonary:     Effort: Pulmonary effort is normal. No  respiratory distress.     Breath sounds: Normal breath sounds.  Abdominal:     General: Abdomen is flat. Bowel sounds are normal.     Palpations: Abdomen is soft.     Tenderness: There is no abdominal tenderness.  Musculoskeletal:     Right lower leg: No edema.     Left lower leg: No edema.  Skin:    General: Skin is warm and dry.     Findings: No lesion.  Neurological:     General: No focal deficit present.     Mental Status: She is alert and oriented to person, place, and time. Mental status is at baseline.     ED Results / Procedures / Treatments   Labs (all labs ordered are listed, but only abnormal results are displayed) Labs Reviewed  RESP PANEL BY RT-PCR (RSV, FLU A&B, COVID)  [id number]  CBC  BASIC  METABOLIC PANEL    EKG None  Radiology DG Chest 2 View  Result Date: 03/21/2023 CLINICAL DATA:  Cough. EXAM: CHEST - 2 VIEW COMPARISON:  04/04/2022. FINDINGS: Bilateral lung fields are clear. Bilateral costophrenic angles are clear. Normal cardio-mediastinal silhouette. No acute osseous abnormalities. The soft tissues are within normal limits. IMPRESSION: *No active cardiopulmonary disease. Electronically Signed   By: Jules Schick M.D.   On: 03/21/2023 11:22    Procedures Procedures    Medications Ordered in ED Medications - No data to display  ED Course/ Medical Decision Making/ A&P                                 Medical Decision Making Amount and/or Complexity of Data Reviewed Labs: ordered. Radiology: ordered.  Risk Prescription drug management.   [name] 37 y.o. presented today for URI like symptoms. Working DDx that I considered at this time includes, but not limited to, viral illness, pharyngitis, mono, sinusitis, electrolyte abnormality, AOM, bronchitis   R/o DDx: these additional diagnoses are not consistent with patient's history, presentation, physical exam, labs/imaging findings.  Review of prior external notes: None  Labs:  Respiratory Panel: NEG CBC no leukocytosis no anemia BMP no electrolyte abnormality  Imaging:  Chest x-ray: No active cardiopulmonary  Problem List / ED Course / Critical interventions / Medication management  Patient reporting to emergency room with 1 week of cough.  Patient denies any fevers or chills at home.  Patient is overall well-appearing with no wheezing on exam.  Obtain chest x-ray to ensure there is no developing pneumonia.  Patient has diagnosis of chronic bronchitis she feels this is secondary to exacerbation of her chronic bronchitis.  Patient reports she has been feeling more tired than normal but otherwise feeling well.  Patient does not have sign of DVT nor she having chest pain or shortness of breath.  Thus I  doubt pulmonary embolism as cause of symptoms.  Patient does not have significant leukocytosis, no respiratory distress or increased work of breathing.  Patient hemodynamically stable well-appearing.  I have sent patient with antibiotics for acute exacerbation of chronic bronchitis as well as short course of steroids.  Patient reports good primary care follow-up and will follow-up with primary care. Medication offered however patient declined Patients vitals assessed. Upon arrival patient is hemodynamically stable.  I have reviewed the patients home medicines and have made adjustments as needed     Plan:  F/u w/ PCP in 2-3d to ensure resolution of sx.  Patient was  given return precautions. Patient stable for discharge at this time.  Patient educated on sx and dx and verbalized understanding of plan. Return to ER if new or worsening sx.          Final Clinical Impression(s) / ED Diagnoses Final diagnoses:  Acute exacerbation of chronic bronchitis [facility])    Rx / DC Orders ED Discharge Orders     None         Reinaldo Raddle 03/21/23 1740    Arby Barrette, MD 03/24/23 2233

## 2023-03-21 NOTE — Discharge Instructions (Addendum)
You are seen in the emergency room today for cough.  Your symptoms are consistent with acute bronchitis.  I have sent medications to your pharmacy please take as prescribed. Tessalon can be used as needed for cough.  I recommend staying well-hydrated, you can use cough drops or warm tea with honey.   Please follow-up with primary care to ensure resolution of symptoms return to emergency room if you have any new or worsening symptoms.

## 2023-03-21 NOTE — ED Triage Notes (Signed)
Patient is here for evaluation of cough. Reports this morning while coughing she noticed some blood in her sputum. Reports productive cough X 1 week. Reports only taking nyquil and dayquil for cough.

## 2023-03-29 ENCOUNTER — Telehealth: Payer: Self-pay

## 2023-03-29 ENCOUNTER — Other Ambulatory Visit: Payer: Self-pay | Admitting: Physician Assistant

## 2023-03-29 MED ORDER — TRAMADOL HCL 50 MG PO TABS: 100.0000 mg | ORAL_TABLET | Freq: Four times a day (QID) | ORAL | 0 refills | Status: DC | PRN

## 2023-03-29 NOTE — Progress Notes (Signed)
Prescription sent to patient's pharmacy for Tramadol refill per patient's request. Patient takes 100 mg q6 hours, no change in dose or frequency at this time. I have reviewed the PDMP during this encounter.

## 2023-03-29 NOTE — Telephone Encounter (Signed)
She called and left a message requesting tramadol refill to [facility].

## 2023-04-15 ENCOUNTER — Telehealth: Payer: Self-pay

## 2023-04-15 ENCOUNTER — Other Ambulatory Visit: Payer: Self-pay | Admitting: Hematology and Oncology

## 2023-04-15 ENCOUNTER — Other Ambulatory Visit: Payer: Self-pay

## 2023-04-15 MED ORDER — TRAMADOL HCL 50 MG PO TABS: 100.0000 mg | ORAL_TABLET | Freq: Four times a day (QID) | ORAL | 0 refills | Status: DC | PRN

## 2023-04-15 NOTE — Telephone Encounter (Signed)
done

## 2023-04-15 NOTE — Telephone Encounter (Signed)
Called and told her Rx sent. She verbalized understanding. 

## 2023-04-15 NOTE — Telephone Encounter (Signed)
Notified Patient of prior authorization approval for Tramadol 50 mg tablets. Medication is approved through 04/26/2024. No other needs or concerns voiced at this time.

## 2023-04-15 NOTE — Telephone Encounter (Signed)
She left a message a requesting Tramadol Rx refill. She is asking that the Rx be sent to [facility] on [address]. Pharmacy added to list.

## 2023-04-27 ENCOUNTER — Other Ambulatory Visit: Payer: Self-pay | Admitting: Hematology and Oncology

## 2023-04-27 MED ORDER — TRAMADOL HCL 50 MG PO TABS: 100.0000 mg | ORAL_TABLET | Freq: Four times a day (QID) | ORAL | 0 refills | Status: DC | PRN

## 2023-04-27 NOTE — Progress Notes (Signed)
 Tramadol refilled on behalf of Dr Bertis Ruddy.  [name]

## 2023-05-10 ENCOUNTER — Other Ambulatory Visit: Payer: Self-pay | Admitting: Hematology and Oncology

## 2023-05-10 ENCOUNTER — Telehealth: Payer: Self-pay

## 2023-05-10 MED ORDER — TRAMADOL HCL 50 MG PO TABS: 100.0000 mg | ORAL_TABLET | Freq: Four times a day (QID) | ORAL | 0 refills | Status: DC | PRN

## 2023-05-10 NOTE — Telephone Encounter (Signed)
 Called and left a message that Rx sent. Ask her to call the office back for questions.

## 2023-05-10 NOTE — Telephone Encounter (Signed)
 I refilled to latest pharmacy at [address]

## 2023-05-10 NOTE — Telephone Encounter (Signed)
 She called and requested tramadol Rx to pharmacy please.

## 2023-05-12 ENCOUNTER — Other Ambulatory Visit: Payer: Self-pay

## 2023-05-12 DIAGNOSIS — C8118 Nodular sclerosis classical Hodgkin lymphoma, lymph nodes of multiple sites: Secondary | ICD-10-CM

## 2023-05-13 ENCOUNTER — Encounter: Payer: Self-pay | Admitting: Hematology and Oncology

## 2023-05-13 ENCOUNTER — Inpatient Hospital Stay: Payer: 59 | Attending: Hematology and Oncology

## 2023-05-13 ENCOUNTER — Inpatient Hospital Stay (HOSPITAL_BASED_OUTPATIENT_CLINIC_OR_DEPARTMENT_OTHER): Payer: 59 | Admitting: Hematology and Oncology

## 2023-05-13 VITALS — BP 121/70 | HR 77 | Temp 99.3°F | Resp 18 | Ht 64.0 in | Wt 108.4 lb

## 2023-05-13 DIAGNOSIS — C8118 Nodular sclerosis classical Hodgkin lymphoma, lymph nodes of multiple sites: Secondary | ICD-10-CM | POA: Insufficient documentation

## 2023-05-13 DIAGNOSIS — Z79899 Other long term (current) drug therapy: Secondary | ICD-10-CM | POA: Insufficient documentation

## 2023-05-13 DIAGNOSIS — G8929 Other chronic pain: Secondary | ICD-10-CM

## 2023-05-13 DIAGNOSIS — M2550 Pain in unspecified joint: Secondary | ICD-10-CM

## 2023-05-13 LAB — CBC WITH DIFFERENTIAL (CANCER CENTER ONLY)
Abs Immature Granulocytes: 0.03 10*3/uL (ref 0.00–0.07)
Basophils Absolute: 0 10*3/uL (ref 0.0–0.1)
Basophils Relative: 0 %
Eosinophils Absolute: 0.2 10*3/uL (ref 0.0–0.5)
Eosinophils Relative: 3 %
HCT: 34.3 % — ABNORMAL LOW (ref 36.0–46.0)
Hemoglobin: 11.3 g/dL — ABNORMAL LOW (ref 12.0–15.0)
Immature Granulocytes: 0 %
Lymphocytes Relative: 11 %
Lymphs Abs: 0.9 10*3/uL (ref 0.7–4.0)
MCH: 31.2 pg (ref 26.0–34.0)
MCHC: 32.9 g/dL (ref 30.0–36.0)
MCV: 94.8 fL (ref 80.0–100.0)
Monocytes Absolute: 0.9 10*3/uL (ref 0.1–1.0)
Monocytes Relative: 11 %
Neutro Abs: 6.4 10*3/uL (ref 1.7–7.7)
Neutrophils Relative %: 75 %
Platelet Count: 471 10*3/uL — ABNORMAL HIGH (ref 150–400)
RBC: 3.62 MIL/uL — ABNORMAL LOW (ref 3.87–5.11)
RDW: 14.8 % (ref 11.5–15.5)
WBC Count: 8.5 10*3/uL (ref 4.0–10.5)
nRBC: 0 % (ref 0.0–0.2)

## 2023-05-13 LAB — CMP (CANCER CENTER ONLY)
ALT: 17 U/L (ref 10–47)
AST: 16 U/L (ref 11–38)
Albumin: 3.4 g/dL — ABNORMAL LOW (ref 3.5–5.0)
Alkaline Phosphatase: 98 U/L (ref 38–126)
Anion gap: 6 (ref 5–15)
BUN: 8 mg/dL (ref 6–20)
CO2: 28 mmol/L (ref 22–32)
Calcium: 9.4 mg/dL (ref 8.9–10.3)
Chloride: 102 mmol/L (ref 98–111)
Creatinine: 0.89 mg/dL (ref 0.60–1.20)
Glucose, Bld: 106 mg/dL — ABNORMAL HIGH (ref 70–99)
Potassium: 3.9 mmol/L (ref 3.5–5.1)
Sodium: 136 mmol/L (ref 135–145)
Total Bilirubin: 0.3 mg/dL (ref 0.2–1.6)
Total Protein: 7.7 g/dL (ref 6.5–8.1)

## 2023-05-13 MED ORDER — MIRTAZAPINE 15 MG PO TABS: 15.0000 mg | ORAL_TABLET | Freq: Every day | ORAL | 1 refills | Status: DC

## 2023-05-13 NOTE — Assessment & Plan Note (Signed)
She has no clinical signs or symptoms of recurrence In the absence of symptoms, I do not feel strongly we need to order serial surveillance imaging studies I plan to see her again in 6 months for further follow-up The patient is educated to watch for signs and symptoms of cancer recurrence

## 2023-05-13 NOTE — Assessment & Plan Note (Signed)
She has chronic musculoskeletal pain since discontinuation of treatment She will continue prescribed tramadol We discussed narcotic refill policy 

## 2023-05-13 NOTE — Progress Notes (Signed)
[facility] OFFICE PROGRESS NOTE  Patient Care Team: Pcp, No as PCP - General Adam Phenix, MD as Consulting Physician (Obstetrics and Gynecology) Nyoka Cowden, MD as Consulting Physician (Pulmonary Disease) Loreli Slot, MD as Consulting Physician (Cardiothoracic Surgery) Comer, Belia Heman, MD as Consulting Physician (Infectious Diseases)  ASSESSMENT & PLAN:  Hodgkin lymphoma, nodular sclerosis (HCC) She has no clinical signs or symptoms of recurrence In the absence of symptoms, I do not feel strongly we need to order serial surveillance imaging studies I plan to see her again in 6 months for further follow-up The patient is educated to watch for signs and symptoms of cancer recurrence  Chronic joint pain She has chronic musculoskeletal pain since discontinuation of treatment She will continue prescribed tramadol We discussed narcotic refill policy  No orders of the defined types were placed in this encounter.   All questions were answered. The patient knows to call the clinic with any problems, questions or concerns. The total time spent in the appointment was 20 minutes encounter with patients including review of chart and various tests results, discussions about plan of care and coordination of care plan   Artis Delay, MD 05/13/2023 10:14 AM  INTERVAL HISTORY: Please see below for problem oriented charting. she returns for surveillance follow-up for history of recurrent Hodgkin lymphoma She is doing well She has not lost weight She lost her job 3 months ago She denies new lymphadenopathy or recent infection  REVIEW OF SYSTEMS:   Constitutional: Denies fevers, chills or abnormal weight loss Eyes: Denies blurriness of vision Ears, nose, mouth, throat, and face: Denies mucositis or sore throat Respiratory: Denies cough, dyspnea or wheezes Cardiovascular: Denies palpitation, chest discomfort or lower extremity swelling Gastrointestinal:  Denies  nausea, heartburn or change in bowel habits Skin: Denies abnormal skin rashes Lymphatics: Denies new lymphadenopathy or easy bruising Neurological:Denies numbness, tingling or new weaknesses Behavioral/Psych: Mood is stable, no new changes  All other systems were reviewed with the patient and are negative.  I have reviewed the past medical history, past surgical history, social history and family history with the patient and they are unchanged from previous note.  ALLERGIES:  is allergic to temazepam.  MEDICATIONS:  Current Outpatient Medications  Medication Sig Dispense Refill   bictegravir-emtricitabine-tenofovir AF (BIKTARVY) 50-200-25 MG TABS tablet Take 1 tablet by mouth daily. 30 tablet 11   Brimonidine Tartrate (LUMIFY) 0.025 % SOLN Apply to eye.     cholecalciferol (VITAMIN D) 1000 units tablet Take 1 tablet (1,000 Units total) by mouth daily. 30 tablet 9   mirtazapine (REMERON) 15 MG tablet Take 1 tablet (15 mg total) by mouth at bedtime. 30 tablet 1   traMADol (ULTRAM) 50 MG tablet Take 2 tablets (100 mg total) by mouth every 6 (six) hours as needed for up to 11 days for severe pain (pain score 7-10). 90 tablet 0   No current facility-administered medications for this visit.    SUMMARY OF ONCOLOGIC HISTORY: Oncology History  Hodgkin lymphoma, nodular sclerosis (HCC)  05/06/2014 Imaging   CT scan of the abdomen show diffuse mesenteric lymphadenopathy.   05/07/2014 Imaging   CT scan of the chest show right thoracic inlet lymphadenopathy   06/07/2014 Procedure   She underwent ultrasound-guided core biopsy of the neck lymph node   06/07/2014 Pathology Results   Accession: [id number] biopsy confirmed diagnosis of Hodgkin lymphoma.   06/15/2014 Imaging   Echocardiogram showed preserved ejection fraction   07/09/2014 - 07/12/2014 Hospital Admission  She was admitted to the hospital for severe anemia.   07/27/2014 Procedure   She had placement of port   07/31/2014 - 09/11/2014  Chemotherapy   She received dose adjusted chemotherapy due to abnormal liver function tests and severe anemia. Treatment was delayed due to noncompliance  and subsequently stopped because the patient failed to keep appointments   01/11/2015 Imaging   Repeat PET CT scan showed response to treatment   01/28/2015 - 06/18/2015 Chemotherapy   ABVD was restarted with full dose.   02/08/2015 - 02/10/2015 Hospital Admission   The patient was admitted to the hospital due to pancytopenia and profuse diarrhea. Cultures were negative. She was placed on ciprofloxacin.   02/11/2015 Adverse Reaction   Treatment was placed on hold due to recent infection.   04/11/2015 Imaging   PET CT scan showed near complete response. Incidental finding of an abnormal bone lesion, indeterminate. She is not symptomatic. Recommendation from Hem TB to observe   07/11/2015 Imaging   PET CT scan showed abnormal new bone lesions, suggestive of possible disease progression   07/23/2015 Bone Marrow Biopsy   She underwent bone biopsy   07/23/2015 Pathology Results   Accession: [id number]  biopsy was negative for cancer   11/21/2015 Surgery   She had surgery for ectopic pregnancy   01/29/2016 Imaging   Ct chest, abdomen and pelvis showed pelvic and retroperitoneal lymphadenopathy, as above, concerning for residual disease. There is also a mildly enlarged posterior mediastinal lymph node measuring 11 mm adjacent to the distal descending thoracic aorta. This may represent an additional focus of disease, but is the only finding of concern in the thorax on today's examination. Sclerosis in the right ilium at site of previously noted metabolically active lesion, grossly unchanged. No other definite osseous lesions are identified on today's examination. Spleen is normal in size and appearance.   02/14/2016 PET scan   Interval disease worsening with new foci of hypermetabolic activity in multiple retroperitoneal and pelvic lymph nodes as  well as AP window and left hilar lymph nodes. (Deauville 5). There is also overall worsening of the osseous disease.   05/11/2016 Pathology Results   Diagnosis Lymph node, needle/core biopsy, Left para-aortic retroperitoneal - CLASSICAL HODGKIN LYMPHOMA. - SEE ONCOLOGY TABLE. Microscopic Comment LYMPHOMA Histologic type: Classical Hodgkin lymphoma. Grade (if applicable): N/A Flow cytometry: Not done. Immunohistochemical stains: CD15, CD20, CD3, LCA, PAX-5, CD30 with appropriate controls. Touch preps/imprints: Not performed. Comments: The sections show small needle core biopsy fragments displaying a polymorphous cellular proliferation of small lymphocytes, plasma cells, eosinophils, and large atypical mononuclear and multilobated lymphoid cells with features of Reed-Sternberg cells and variants. This is associated with patchy areas of fibrosis. Immunohistochemical stains were performed and show that the large atypical lymphoid cells are positive for CD30, CD15 and PAX-5 and negative for LCA, CD20, CD3. The small lymphoid cells in the background show a mixture of T and B cells with predominance of T cells. The overall morphologic and histologic features are consistent with classical Hodgkin lymphoma. Further subtyping is challenging in limited small biopsy fragments but the patchy fibrosis suggests nodular sclerosis type.    05/11/2016 Procedure   She underwent CT guided biopsy of retroperitoneal lymph node   05/26/2016 Procedure   Successful placement of a right IJ approach Power Port with ultrasound and fluoroscopic guidance. The catheter is ready for use.   06/02/2016 PET scan   Mixed response to chemotherapy with some lymph nodes decreased in metabolic activity and some lymph nodes increase  metabolic activity. Lymph node stations including mediastinum, periaortic retroperitoneum, and obturator node stations. Activity is remains relatively intense Deauville 4 2. LEFT infrahilar nodule / lymph  node with intense metabolic activity decreased from prior. ( Deauville 4 ). 3. 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 Deauville 5   06/03/2016 - 06/05/2016 Hospital Admission   She was admitted to the hospital for cycle 1 of ICE chemotherapy   06/24/2016 - 06/26/2016 Hospital Admission   She received cycle 2 of ICE chemo   07/07/2016 PET scan   Resolution of prior hypermetabolic adenopathy and resolution of prior osseous foci of hypermetabolic activity compatible with essentially complete response to therapy (Deauville 1). 2. Generalized reduced activity in the L4 vertebral body and the type of finding which would typically reflect prior radiation therapy. 3. Stable septated fatty right pelvic lesion, possibly a dermoid or lipoma, not hypermetabolic   10/12/2016 PET scan   Hypermetabolic lesion along the L3 vertebral body and left posterior elements, max SUV 7.8 (Deauville 5). Hypermetabolic lesion along the left inferior pubic ramus, max SUV 4.9 (Deauville 4).  IMPRESSION: Prevascular lymphadenopathy, reflecting nodal recurrence (Deauville 4). Hypermetabolic osseous metastases involving the L3 vertebral body/posterior elements and left inferior pubic ramus (Deauville 4-5). Hypermetabolism along the endometrium, new, possibly reactive/physiologic. Consider pelvic ultrasound and/or endometrial sampling as clinically warranted.   11/04/2016 - 02/24/2017 Chemotherapy   She received Brentuximab   12/11/2016 PET scan   1. Mixed response to therapy within the skeleton. Lesions at L3 is decreased in size and metabolic activity. Residual activity remains above liver ( Deauville 4) 2. Increased activity in the LEFT sacrum with metabolic activity above liver activity ( Deauville 4 3. Decrease in size and metabolic activity of anterior mediastinal tissue consistent with resolution of thymic hyperplasia or resolution of lymphoma metabolic activity ( Deauville 2). 4. No new  lymphadenopathy.  Normal spleen and liver.   03/25/2017 PET scan   1. Increased metabolic activity in several normal sized retroperitoneal and right pelvic lymph nodes, Deauville 4. 2. Increase in size and metabolic activity of lesions in the L3 and L4 vertebral bodies and in the left sacrum, Deauville 5. 3. No recurrence of anterior mediastinal abnormal activity. 4. Other imaging findings of potential clinical significance: Chronic ethmoid and left maxillary sinusitis. Paraseptal emphysema at the lung apices. Right ovarian dermoid.   04/06/2017 -  Chemotherapy   The patient had Keytruda   04/06/2017 - 05/23/2019 Chemotherapy   The patient had pembrolizumab (KEYTRUDA) 200 mg in sodium chloride 0.9 % 50 mL chemo infusion, 200 mg, Intravenous, Once, 30 of 31 cycles Administration: 200 mg (04/06/2017), 200 mg (04/29/2017), 200 mg (05/20/2017), 200 mg (06/10/2017), 200 mg (07/01/2017), 200 mg (07/22/2017), 200 mg (08/12/2017), 200 mg (09/02/2017), 200 mg (10/04/2017), 200 mg (11/03/2017), 200 mg (12/02/2017), 200 mg (12/30/2017), 200 mg (01/27/2018), 200 mg (02/24/2018), 200 mg (03/22/2018), 200 mg (04/19/2018), 200 mg (05/17/2018), 200 mg (06/14/2018), 200 mg (07/12/2018), 200 mg (08/09/2018), 200 mg (09/06/2018), 200 mg (10/04/2018), 200 mg (11/01/2018), 200 mg (11/29/2018), 200 mg (12/27/2018), 200 mg (01/24/2019), 200 mg (03/14/2019), 200 mg (05/23/2019)  for chemotherapy treatment.    06/09/2017 PET scan   1. Overall significant improvement with reduction in nodal activity. Several retroperitoneal nodes which were previously Deauville 4 are currently Deauville 3. The right common iliac lymph node remains at Deauville 4 with maximum SUV of 3.4, but is improved from prior SUV of 4.3. 2. The bony lesions show the greatest improvement, with the previously [employer]  5 hypermetabolic lesions currently no longer of higher metabolic activity than surrounding bone marrow, currently measuring at Deauville 3. 3. 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 4. No new lesions are identified. 5.  Emphysema (ICD10-J43.9).   08/05/2017 Imaging   1. Somewhat complex left adnexal lesion, potentially hemorrhagic cyst measuring 2.9 x 2.8 cm. The possibility of torsion in the left ovary must be considered. Advise correlation with pelvic ultrasound including Doppler assessment to further evaluate.   2.  Right ovarian dermoid, unchanged from recent PET-CT examination.   3. No adenopathy by size criteria evident. Appearance of subcentimeter mesenteric lymph nodes is stable compared to recent study.   4. No bone lesions appreciable by CT. Areas of abnormal radiotracer uptake in the mid lumbar spine noted on recent PET-CT examination. No destruction 2 or lytic change noted in the lumbar vertebrae currently there.   5. No bowel obstruction. No abscess. No periappendiceal region inflammation.   6.  Small hiatal hernia with fluid in distal esophagus.   7.  Foci of coronary artery calcification, advanced for age.   8.  Spleen normal in size and contour.   9. Small hiatal hernia with mild fluid in the distal esophagus, likely indicative of a degree of reflux.   01/04/2018 Imaging   1. No findings to suggest residual/recurrent lymphoma in the chest, abdomen or pelvis. 2. Right ovarian dermoid slightly larger than prior examinations, currently measuring 3.8 x 2.8 x 3.1 cm. 3. Additional incidental findings, as above.   07/11/2018 Imaging   No findings suspicious for recurrent lymphoma. Spleen is normal in size.   Stable right ovarian dermoid.   4 mm subpleural nodular opacity in the posterior right lower lobe, favoring subpleural atelectasis.   07/17/2019 Imaging   1. Stable exam. No new or progressive findings to suggest recurrent disease. 2. Stable right adnexal dermoid. 3. Emphysema (ICD10-J43.9).   09/19/2019 Procedure   Successful right IJ vein Port-A-Cath explant.   12/05/2019 Imaging    1. Stable exam. No findings to suggest recurrent lymphoma. 2. 9 mm left para-aortic node is upper normal for size and mildly increased from 7 mm previously. Attention on follow-up recommended.  3. Stable right adnexal dermoid. 4. Emphysema (ICD10-J43.9).       PHYSICAL EXAMINATION: ECOG PERFORMANCE STATUS: 1 - Symptomatic but completely ambulatory  Vitals:   05/13/23 0918  BP: 121/70  Pulse: 77  Resp: 18  Temp: 99.3 F (37.4 C)  SpO2: 100%   Filed Weights   05/13/23 0918  Weight: 108 lb 6.4 oz (49.2 kg)    GENERAL:alert, no distress and comfortable SKIN: skin color, texture, turgor are normal, no rashes or significant lesions EYES: normal, Conjunctiva are pink and non-injected, sclera clear OROPHARYNX:no exudate, no erythema and lips, buccal mucosa, and tongue normal  NECK: supple, thyroid normal size, non-tender, without nodularity LYMPH:  no palpable lymphadenopathy in the cervical, axillary or inguinal LUNGS: clear to auscultation and percussion with normal breathing effort HEART: regular rate & rhythm and no murmurs and no lower extremity edema ABDOMEN:abdomen soft, non-tender and normal bowel sounds Musculoskeletal:no cyanosis of digits and no clubbing  NEURO: alert & oriented x 3 with fluent speech, no focal motor/sensory deficits  LABORATORY DATA:  I have reviewed the data as listed    Component Value Date/Time   NA 136 05/13/2023 0843   NA 140 04/29/2017 0745   K 3.9 05/13/2023 0843   K 3.3 (L) 04/29/2017 0745   CL 102 05/13/2023  0843   CO2 28 05/13/2023 0843   CO2 23 04/29/2017 0745   GLUCOSE 106 (H) 05/13/2023 0843   GLUCOSE 83 04/29/2017 0745   BUN 8 05/13/2023 0843   BUN 10.7 04/29/2017 0745   CREATININE 0.89 05/13/2023 0843   CREATININE 1.2 (H) 04/29/2017 0745   CALCIUM 9.4 05/13/2023 0843   CALCIUM 9.8 04/29/2017 0745   PROT 7.7 05/13/2023 0843   PROT 7.6 04/29/2017 0745   ALBUMIN 3.4 (L) 05/13/2023 0843   ALBUMIN 4.0 04/29/2017 0745   AST  16 05/13/2023 0843   AST 19 04/29/2017 0745   ALT 17 05/13/2023 0843   ALT 19 04/29/2017 0745   ALKPHOS 98 05/13/2023 0843   ALKPHOS 81 04/29/2017 0745   BILITOT 0.3 05/13/2023 0843   BILITOT 1.31 (H) 04/29/2017 0745   GFRNONAA >60 03/21/2023 1051   GFRNONAA >60 11/05/2022 0822   GFRNONAA 83 02/20/2016 0908   GFRAA >60 11/07/2019 0740   GFRAA >89 02/20/2016 0908    No results found for: "SPEP", "UPEP"  Lab Results  Component Value Date   WBC 8.5 05/13/2023   NEUTROABS 6.4 05/13/2023   HGB 11.3 (L) 05/13/2023   HCT 34.3 (L) 05/13/2023   MCV 94.8 05/13/2023   PLT 471 (H) 05/13/2023      Chemistry      Component Value Date/Time   NA 136 05/13/2023 0843   NA 140 04/29/2017 0745   K 3.9 05/13/2023 0843   K 3.3 (L) 04/29/2017 0745   CL 102 05/13/2023 0843   CO2 28 05/13/2023 0843   CO2 23 04/29/2017 0745   BUN 8 05/13/2023 0843   BUN 10.7 04/29/2017 0745   CREATININE 0.89 05/13/2023 0843   CREATININE 1.2 (H) 04/29/2017 0745      Component Value Date/Time   CALCIUM 9.4 05/13/2023 0843   CALCIUM 9.8 04/29/2017 0745   ALKPHOS 98 05/13/2023 0843   ALKPHOS 81 04/29/2017 0745   AST 16 05/13/2023 0843   AST 19 04/29/2017 0745   ALT 17 05/13/2023 0843   ALT 19 04/29/2017 0745   BILITOT 0.3 05/13/2023 0843   BILITOT 1.31 (H) 04/29/2017 0745

## 2023-05-25 ENCOUNTER — Other Ambulatory Visit: Payer: Self-pay | Admitting: Hematology and Oncology

## 2023-05-25 ENCOUNTER — Telehealth: Payer: Self-pay

## 2023-05-25 MED ORDER — TRAMADOL HCL 50 MG PO TABS: 100.0000 mg | ORAL_TABLET | Freq: Four times a day (QID) | ORAL | 0 refills | Status: DC | PRN

## 2023-05-25 NOTE — Telephone Encounter (Signed)
Called and told her Rx sent. She verbalized understanding.

## 2023-05-25 NOTE — Telephone Encounter (Signed)
She called and left a message requesting Tramadol refill to [facility] on [address].

## 2023-05-25 NOTE — Telephone Encounter (Signed)
done

## 2023-05-26 ENCOUNTER — Telehealth: Payer: Self-pay | Admitting: Internal Medicine

## 2023-05-26 NOTE — Telephone Encounter (Signed)
Terecia's Care Navigator of [employer] called with the patient on the line to schedule a sooner appointment. Patient expressed depression in her assessment (phq2). I offered next day (1/30) but patient stated that was too soon. I then offered 2/12 but patient decided to keep original appointment of 2/20. The Care Navigator wanted to make the provider aware of Nicole's depression. [facility] Care Navigator [phone]

## 2023-06-03 ENCOUNTER — Other Ambulatory Visit (HOSPITAL_COMMUNITY)
Admission: RE | Admit: 2023-06-03 | Discharge: 2023-06-03 | Disposition: A | Discharge status: Home / Self Care | Payer: 59 | Source: Ambulatory Visit | Attending: Internal Medicine | Admitting: Internal Medicine

## 2023-06-03 ENCOUNTER — Other Ambulatory Visit: Payer: Self-pay

## 2023-06-03 ENCOUNTER — Other Ambulatory Visit: Payer: Medicaid Other

## 2023-06-03 DIAGNOSIS — B20 Human immunodeficiency virus [HIV] disease: Secondary | ICD-10-CM

## 2023-06-03 DIAGNOSIS — Z79899 Other long term (current) drug therapy: Secondary | ICD-10-CM

## 2023-06-03 DIAGNOSIS — Z113 Encounter for screening for infections with a predominantly sexual mode of transmission: Secondary | ICD-10-CM | POA: Diagnosis present

## 2023-06-04 LAB — URINE CYTOLOGY ANCILLARY ONLY
Chlamydia: NEGATIVE
Comment: NEGATIVE
Comment: NORMAL
Neisseria Gonorrhea: NEGATIVE

## 2023-06-04 LAB — T-HELPER CELL (CD4) - (RCID CLINIC ONLY)
CD4 % Helper T Cell: 41 % (ref 33–65)
CD4 T Cell Abs: 371 /uL — ABNORMAL LOW (ref 400–1790)

## 2023-06-04 MED ORDER — PROPOFOL 1000 MG/100ML IV EMUL
INTRAVENOUS | Status: AC
  Filled 2023-06-04: qty 100

## 2023-06-04 MED ORDER — PHENYLEPHRINE HCL-NACL 20-0.9 MG/250ML-% IV SOLN
INTRAVENOUS | Status: AC
  Filled 2023-06-04: qty 500

## 2023-06-06 LAB — HIV-1 RNA QUANT-NO REFLEX-BLD
HIV 1 RNA Quant: NOT DETECTED {copies}/mL
HIV-1 RNA Quant, Log: NOT DETECTED {Log}

## 2023-06-06 LAB — LIPID PANEL
Cholesterol: 137 mg/dL (ref <200)
HDL: 43 mg/dL — ABNORMAL LOW (ref >=50)
LDL Cholesterol (Calc): 76 mg/dL
Non-HDL Cholesterol (Calc): 94 mg/dL (ref <130)
Total CHOL/HDL Ratio: 3.2 (calc) (ref <5.0)
Triglycerides: 94 mg/dL (ref <150)

## 2023-06-06 LAB — RPR: RPR Ser Ql: NONREACTIVE

## 2023-06-09 ENCOUNTER — Other Ambulatory Visit: Payer: Self-pay | Admitting: Hematology and Oncology

## 2023-06-09 ENCOUNTER — Telehealth: Payer: Self-pay

## 2023-06-09 MED ORDER — TRAMADOL HCL 50 MG PO TABS: 100.0000 mg | ORAL_TABLET | Freq: Four times a day (QID) | ORAL | 0 refills | Status: DC | PRN

## 2023-06-09 MED ORDER — ONDANSETRON HCL 4 MG PO TABS: 4.0000 mg | ORAL_TABLET | Freq: Three times a day (TID) | ORAL | 1 refills | Status: DC | PRN

## 2023-06-09 NOTE — Telephone Encounter (Signed)
Pt called and requested refill for Tramadol 50 mg 2 tabs PO q6h PRN, as well as her "nausea pill". Tramadol last sent by MD 05/25/23. She is requesting meds refilled at [facility] on [address]. Attempted to return pt's call to discuss antiemetic, as MD has not ordered since 2020. LVM for call back.

## 2023-06-09 NOTE — Telephone Encounter (Signed)
I refilled her tramadol and send some zofran to take as needed

## 2023-06-13 ENCOUNTER — Emergency Department (HOSPITAL_COMMUNITY)
Admission: EM | Admit: 2023-06-13 | Discharge: 2023-06-14 | Disposition: A | Discharge status: Home / Self Care | Payer: 59 | Attending: Emergency Medicine | Admitting: Emergency Medicine

## 2023-06-13 ENCOUNTER — Emergency Department (HOSPITAL_COMMUNITY): Payer: 59

## 2023-06-13 ENCOUNTER — Other Ambulatory Visit: Payer: Self-pay

## 2023-06-13 DIAGNOSIS — Z8571 Personal history of Hodgkin lymphoma: Secondary | ICD-10-CM | POA: Diagnosis not present

## 2023-06-13 DIAGNOSIS — Z79624 Long term (current) use of inhibitors of nucleotide synthesis: Secondary | ICD-10-CM | POA: Insufficient documentation

## 2023-06-13 DIAGNOSIS — M542 Cervicalgia: Secondary | ICD-10-CM | POA: Diagnosis present

## 2023-06-13 DIAGNOSIS — Z21 Asymptomatic human immunodeficiency virus [HIV] infection status: Secondary | ICD-10-CM | POA: Diagnosis not present

## 2023-06-13 DIAGNOSIS — M5412 Radiculopathy, cervical region: Secondary | ICD-10-CM | POA: Insufficient documentation

## 2023-06-13 LAB — BASIC METABOLIC PANEL
Anion gap: 10 (ref 5–15)
BUN: 12 mg/dL (ref 6–20)
CO2: 23 mmol/L (ref 22–32)
Calcium: 8.9 mg/dL (ref 8.9–10.3)
Chloride: 102 mmol/L (ref 98–111)
Creatinine, Ser: 0.77 mg/dL (ref 0.44–1.00)
GFR, Estimated: >60 mL/min (ref >60)
Glucose, Bld: 101 mg/dL — ABNORMAL HIGH (ref 70–99)
Potassium: 3.5 mmol/L (ref 3.5–5.1)
Sodium: 135 mmol/L (ref 135–145)

## 2023-06-13 LAB — CBC
HCT: 32.3 % — ABNORMAL LOW (ref 36.0–46.0)
Hemoglobin: 10.4 g/dL — ABNORMAL LOW (ref 12.0–15.0)
MCH: 31.2 pg (ref 26.0–34.0)
MCHC: 32.2 g/dL (ref 30.0–36.0)
MCV: 97 fL (ref 80.0–100.0)
Platelets: 418 10*3/uL — ABNORMAL HIGH (ref 150–400)
RBC: 3.33 MIL/uL — ABNORMAL LOW (ref 3.87–5.11)
RDW: 15.1 % (ref 11.5–15.5)
WBC: 7.8 10*3/uL (ref 4.0–10.5)
nRBC: 0 % (ref 0.0–0.2)

## 2023-06-13 LAB — HCG, QUANTITATIVE, PREGNANCY: hCG, Beta Chain, Quant, S: <1 m[IU]/mL (ref <5)

## 2023-06-13 MED ORDER — DEXAMETHASONE 4 MG PO TABS
10.0000 mg | ORAL_TABLET | Freq: Once | ORAL | Status: AC
  Administered 2023-06-13: 10 mg via ORAL
  Filled 2023-06-13: qty 1

## 2023-06-13 MED ORDER — KETOROLAC TROMETHAMINE 15 MG/ML IJ SOLN
15.0000 mg | Freq: Once | INTRAMUSCULAR | Status: AC
  Administered 2023-06-13: 15 mg via INTRAMUSCULAR
  Filled 2023-06-13: qty 1

## 2023-06-13 MED ORDER — LIDOCAINE 5 % EX PTCH
1.0000 | MEDICATED_PATCH | CUTANEOUS | Status: DC
  Administered 2023-06-13: 1 via TRANSDERMAL
  Filled 2023-06-13: qty 1

## 2023-06-13 NOTE — ED Triage Notes (Signed)
Patient c/o neck pain x 1 week. Patient report worsening neck pain tonight radiating to her left arm. Patient denies fall or accident to her neck. Patient denies N/V.

## 2023-06-13 NOTE — ED Provider Notes (Signed)
[place] EMERGENCY DEPARTMENT AT [facility] Provider Note   CSN: [id number] Arrival date & time: 06/13/23  2022     History {Add pertinent medical, surgical, social history, OB history to HPI:1} Chief Complaint  Patient presents with   Neck Pain    [name] is a 38 y.o. female.  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  She states that she has been experiencing neck pain since September.  She believes that she may have sustained an injury when working at [employer], though she no longer is employed and denies any other recent trauma or injury.  Pain is persistent and unrelieved with baclofen.  She has developed some radiation of the pain to her left upper extremity.  Notes subjective paresthesias at times without associated extremity weakness.  No fevers, bowel or bladder incontinence, nausea, vomiting.  Denies history of IV drug use.  The history is provided by the patient. No language interpreter was used.  Neck Pain      Home Medications Prior to Admission medications   Medication Sig Start Date End Date Taking? Authorizing Provider  bictegravir-emtricitabine-tenofovir AF (BIKTARVY) 50-200-25 MG TABS tablet Take 1 tablet by mouth daily. 12/23/22   Gardiner Barefoot, MD  Brimonidine Tartrate (LUMIFY) 0.025 % SOLN Apply to eye.    [provider]  cholecalciferol (VITAMIN D) 1000 units tablet Take 1 tablet (1,000 Units total) by mouth daily. 06/05/16   Artis Delay, MD  mirtazapine (REMERON) 15 MG tablet Take 1 tablet (15 mg total) by mouth at bedtime. 05/13/23   Artis Delay, MD  ondansetron (ZOFRAN) 4 MG tablet Take 1 tablet (4 mg total) by mouth every 8 (eight) hours as needed for nausea. 06/09/23   Artis Delay, MD  traMADol (ULTRAM) 50 MG tablet Take 2 tablets (100 mg total) by mouth every 6 (six) hours as needed for up to 11 days for severe pain (pain score 7-10).  06/09/23 06/20/23  Artis Delay, MD      Allergies    Temazepam    Review of Systems   Review of Systems  Musculoskeletal:  Positive for neck pain.  Ten systems reviewed and are negative for acute change, except as noted in the HPI.    Physical Exam Updated Vital Signs BP 111/80   Pulse 88   Temp 98 F (36.7 C)   Resp 16   SpO2 99%   Physical Exam Vitals and nursing note reviewed.  Constitutional:      General: She is not in acute distress.    Appearance: She is well-developed. She is not diaphoretic.     Comments: Nontoxic appearing and in NAD  HENT:     Head: Normocephalic and atraumatic.  Eyes:     General: No scleral icterus.    Conjunctiva/sclera: Conjunctivae normal.  Neck:     Comments: Tenderness to the lower cervical midline without bony deformities, step-offs, crepitus.  This tenderness extends to the left cervical paraspinal muscles and trapezius.  No appreciable spasm. Cardiovascular:     Rate and Rhythm: Normal rate and regular rhythm.     Pulses: Normal pulses.     Comments: Distal radial pulse 2+ b/l Pulmonary:     Effort: Pulmonary effort is normal. No respiratory distress.     Comments: Respirations even and unlabored Musculoskeletal:        General: Normal range of motion.  Cervical back: Normal range of motion.  Skin:    General: Skin is warm and dry.     Coloration: Skin is not pale.     Findings: No erythema or rash.  Neurological:     Mental Status: She is alert and oriented to person, place, and time.     Coordination: Coordination normal.     Comments: Grip strength 5/5 bilaterally.  Normal strength against resistance in all major muscle groups of bilateral upper extremities.  Patient ambulatory in the ED without issue.  She has normal shoulder shrug against resistance.  Subjectively reports decreased sensation in the left upper extremity, though sensation to light touch remains intact bilaterally.  Psychiatric:        Behavior: Behavior  normal.     ED Results / Procedures / Treatments   Labs (all labs ordered are listed, but only abnormal results are displayed) Labs Reviewed  CBC - Abnormal; Notable for the following components:      Result Value   RBC 3.33 (*)    Hemoglobin 10.4 (*)    HCT 32.3 (*)    Platelets 418 (*)    All other components within normal limits  BASIC METABOLIC PANEL - Abnormal; Notable for the following components:   Glucose, Bld 101 (*)    All other components within normal limits  HCG, QUANTITATIVE, PREGNANCY    EKG None  Radiology No results found.  Procedures Procedures  {Document cardiac monitor, telemetry assessment procedure when appropriate:1}  Medications Ordered in ED Medications  ketorolac (TORADOL) 15 MG/ML injection 15 mg (has no administration in time range)  dexamethasone (DECADRON) tablet 10 mg (has no administration in time range)  lidocaine (LIDODERM) 5 % 1 patch (has no administration in time range)    ED Course/ Medical Decision Making/ A&P   {   Click here for ABCD2, HEART and other calculatorsREFRESH Note before signing :1}                              Medical Decision Making Amount and/or Complexity of Data Reviewed Labs: ordered. Radiology: ordered.  Risk Prescription drug management.   ***  {Document critical care time when appropriate:1} {Document review of labs and clinical decision tools ie heart score, Chads2Vasc2 etc:1}  {Document your independent review of radiology images, and any outside records:1} {Document your discussion with family members, caretakers, and with consultants:1} {Document social determinants of health affecting pt's care:1} {Document your decision making why or why not admission, treatments were needed:1} Final Clinical Impression(s) / ED Diagnoses Final diagnoses:  None    Rx / DC Orders ED Discharge Orders     None

## 2023-06-14 MED ORDER — PREDNISONE 10 MG (21) PO TBPK: ORAL_TABLET | Freq: Every day | ORAL | 0 refills | Status: DC

## 2023-06-14 MED ORDER — LIDOCAINE 5 % EX PTCH: 1.0000 | MEDICATED_PATCH | CUTANEOUS | 0 refills | Status: DC

## 2023-06-14 NOTE — Discharge Instructions (Addendum)
We recommend prednisone as prescribed for pain control and symptom management.  You may also use Lidoderm patches as prescribed.  These medications can be taken with baclofen which was previously prescribed to you.  Do not drive or drink alcohol if you are taking baclofen as it may make you drowsy and impair your judgment.  You have been provided a referral to orthopedic surgery for further evaluation of your neck pain.  You may follow-up with your primary doctor in the interim.  Return to the ED for new or concerning symptoms.

## 2023-06-15 ENCOUNTER — Ambulatory Visit (INDEPENDENT_AMBULATORY_CARE_PROVIDER_SITE_OTHER): Payer: 59 | Admitting: Internal Medicine

## 2023-06-15 ENCOUNTER — Encounter: Payer: Self-pay | Admitting: Internal Medicine

## 2023-06-15 ENCOUNTER — Other Ambulatory Visit: Payer: Self-pay

## 2023-06-15 VITALS — BP 96/61 | HR 67 | Temp 97.6°F | Ht 64.0 in | Wt 108.0 lb

## 2023-06-15 DIAGNOSIS — F17210 Nicotine dependence, cigarettes, uncomplicated: Secondary | ICD-10-CM | POA: Diagnosis not present

## 2023-06-15 DIAGNOSIS — R636 Underweight: Secondary | ICD-10-CM

## 2023-06-15 DIAGNOSIS — Z113 Encounter for screening for infections with a predominantly sexual mode of transmission: Secondary | ICD-10-CM

## 2023-06-15 DIAGNOSIS — B20 Human immunodeficiency virus [HIV] disease: Secondary | ICD-10-CM | POA: Diagnosis not present

## 2023-06-15 NOTE — Assessment & Plan Note (Signed)
No weight loss, eating well.  Encouraged continued increase in calories

## 2023-06-15 NOTE — Assessment & Plan Note (Signed)
She continues to do well on Biktarvy and no changes indicated. Follow up in 6 months.

## 2023-06-15 NOTE — Assessment & Plan Note (Signed)
 Screened negative

## 2023-06-15 NOTE — Progress Notes (Signed)
   Subjective:    Patient ID: [name], female    DOB: 08-20-1985, 38 y.o.   MRN: [id number]  HPI Shannel is here for follow-up of HIV. Continues on [place] and denies any missed doses.  No issues with getting or taking her medication.  No concerns today.   Review of Systems  Constitutional:  Negative for fatigue.  Gastrointestinal:  Negative for diarrhea.  Skin:  Negative for rash.       Objective:   Physical Exam Eyes:     General: No scleral icterus. Pulmonary:     Effort: Pulmonary effort is normal.  Neurological:     Mental Status: She is alert.   SH: + tobacco        Assessment & Plan:

## 2023-06-15 NOTE — Assessment & Plan Note (Signed)
Discussed cessation and its importance

## 2023-06-17 ENCOUNTER — Ambulatory Visit: Payer: 59 | Admitting: Internal Medicine

## 2023-06-23 ENCOUNTER — Other Ambulatory Visit: Payer: Self-pay | Admitting: Hematology and Oncology

## 2023-06-23 MED ORDER — TRAMADOL HCL 50 MG PO TABS: 100.0000 mg | ORAL_TABLET | Freq: Four times a day (QID) | ORAL | 0 refills | Status: DC | PRN

## 2023-06-29 ENCOUNTER — Telehealth: Payer: Self-pay

## 2023-06-29 NOTE — Telephone Encounter (Signed)
 Patient called to schedule Pap smear. 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  [name] reports she followed up with [name] in our building. She has an appointment with them in a couple of weeks. Encouraged her to discuss further pap recommendations with her GYN.   Allison Ano, RN

## 2023-07-05 ENCOUNTER — Other Ambulatory Visit: Payer: Self-pay | Admitting: Hematology and Oncology

## 2023-07-05 ENCOUNTER — Telehealth: Payer: Self-pay | Admitting: *Deleted

## 2023-07-05 MED ORDER — TRAMADOL HCL 50 MG PO TABS
100.0000 mg | ORAL_TABLET | Freq: Four times a day (QID) | ORAL | 0 refills | Status: DC | PRN
Start: 2023-07-05 — End: 2023-07-20

## 2023-07-05 NOTE — Telephone Encounter (Signed)
 Ms. Dejonge called - requested refill of Tramadol - send to [facility] on [address]

## 2023-07-05 NOTE — Telephone Encounter (Signed)
 done

## 2023-07-20 ENCOUNTER — Telehealth: Payer: Self-pay

## 2023-07-20 ENCOUNTER — Other Ambulatory Visit: Payer: Self-pay | Admitting: Hematology and Oncology

## 2023-07-20 MED ORDER — TRAMADOL HCL 50 MG PO TABS: 100.0000 mg | ORAL_TABLET | Freq: Four times a day (QID) | ORAL | 0 refills | Status: DC | PRN

## 2023-07-20 NOTE — Telephone Encounter (Signed)
 Called and told her Rx sent. She verbalized understanding.

## 2023-07-20 NOTE — Telephone Encounter (Signed)
 done

## 2023-07-20 NOTE — Telephone Encounter (Signed)
 She called and left a message requesting Tramadol refill to [facility] on [address] please.

## 2023-07-24 ENCOUNTER — Encounter (HOSPITAL_COMMUNITY): Payer: Self-pay

## 2023-07-24 ENCOUNTER — Other Ambulatory Visit: Payer: Self-pay

## 2023-07-24 ENCOUNTER — Emergency Department (HOSPITAL_COMMUNITY)
Admission: EM | Admit: 2023-07-24 | Discharge: 2023-07-24 | Disposition: A | Discharge status: Home / Self Care | Attending: Emergency Medicine | Admitting: Emergency Medicine

## 2023-07-24 ENCOUNTER — Emergency Department (HOSPITAL_COMMUNITY)

## 2023-07-24 DIAGNOSIS — Z21 Asymptomatic human immunodeficiency virus [HIV] infection status: Secondary | ICD-10-CM | POA: Diagnosis not present

## 2023-07-24 DIAGNOSIS — R053 Chronic cough: Secondary | ICD-10-CM | POA: Diagnosis not present

## 2023-07-24 DIAGNOSIS — Z8571 Personal history of Hodgkin lymphoma: Secondary | ICD-10-CM | POA: Insufficient documentation

## 2023-07-24 DIAGNOSIS — R059 Cough, unspecified: Secondary | ICD-10-CM | POA: Diagnosis present

## 2023-07-24 DIAGNOSIS — J40 Bronchitis, not specified as acute or chronic: Secondary | ICD-10-CM | POA: Insufficient documentation

## 2023-07-24 DIAGNOSIS — F17210 Nicotine dependence, cigarettes, uncomplicated: Secondary | ICD-10-CM | POA: Insufficient documentation

## 2023-07-24 LAB — RESP PANEL BY RT-PCR (RSV, FLU A&B, COVID)  RVPGX2
Influenza A by PCR: NEGATIVE
Influenza B by PCR: NEGATIVE
Resp Syncytial Virus by PCR: NEGATIVE
SARS Coronavirus 2 by RT PCR: NEGATIVE

## 2023-07-24 MED ORDER — DEXTROMETHORPHAN HBR 15 MG/5ML PO SYRP: 10.0000 mL | ORAL_SOLUTION | Freq: Three times a day (TID) | ORAL | 0 refills | Status: DC | PRN

## 2023-07-24 MED ORDER — PREDNISONE 10 MG (21) PO TBPK
ORAL_TABLET | Freq: Every day | ORAL | 0 refills | Status: DC
Start: 2023-07-24 — End: 2023-11-11

## 2023-07-24 NOTE — ED Provider Notes (Signed)
 [place] EMERGENCY DEPARTMENT AT [facility] Provider Note   CSN: [id number] Arrival date & time: 07/24/23  1450     History  Chief Complaint  Patient presents with   Cough    [name] is a 38 y.o. female with past medical history of HIV, Hodgkin's lymphoma (in remission), tobacco abuse presents emergency department for evaluation of dry cough for the past 3 to 4 months.  She smokes 1/2 pack of cigarettes and "2 blunts" of marijuana daily. She denies nasal congestion, sick exposure, fevers, shortness of breath, and chest pain  She was seen on 03/21/2023 for symptoms of dry cough and diagnosed with acute on chronic bronchitis.  She was prescribed prednisone burst and Tessalon Perles however she did not take the prednisone burst as prescribed and took one pill as day as needed for cough.    Cough Associated symptoms: no chest pain, no chills, no fever, no headaches, no shortness of breath and no wheezing        Home Medications Prior to Admission medications   Medication Sig Start Date End Date Taking? Authorizing Provider  dextromethorphan 15 MG/5ML syrup Take 10 mLs (30 mg total) by mouth 3 (three) times daily as needed for cough. 07/24/23  Yes Judithann Sheen, PA  predniSONE (STERAPRED UNI-PAK 21 TAB) 10 MG (21) TBPK tablet Take by mouth daily. Take 6 tabs by mouth daily  for 2 days, then 5 tabs for 2 days, then 4 tabs for 2 days, then 3 tabs for 2 days, 2 tabs for 2 days, then 1 tab by mouth daily for 2 days 07/24/23  Yes Edyth Gunnels, Pinkney Venard E, PA  bictegravir-emtricitabine-tenofovir AF (BIKTARVY) 50-200-25 MG TABS tablet Take 1 tablet by mouth daily. 12/23/22   Gardiner Barefoot, MD  Brimonidine Tartrate (LUMIFY) 0.025 % SOLN Apply to eye.    [provider]  cholecalciferol (VITAMIN D) 1000 units tablet Take 1 tablet (1,000 Units total) by mouth daily. 06/05/16   Artis Delay, MD  lidocaine (LIDODERM) 5 % Place 1 patch onto the skin daily. Remove & Discard patch  within 12 hours or as directed by MD 06/14/23   Antony Madura, PA-C  mirtazapine (REMERON) 15 MG tablet Take 1 tablet (15 mg total) by mouth at bedtime. 05/13/23   Artis Delay, MD  ondansetron (ZOFRAN) 4 MG tablet Take 1 tablet (4 mg total) by mouth every 8 (eight) hours as needed for nausea. 06/09/23   Artis Delay, MD  predniSONE (STERAPRED UNI-PAK 21 TAB) 10 MG (21) TBPK tablet Take by mouth daily. Take 6 tabs by mouth daily  for 2 days, then 5 tabs for 2 days, then 4 tabs for 2 days, then 3 tabs for 2 days, 2 tabs for 2 days, then 1 tab by mouth daily for 2 days 06/14/23   Antony Madura, PA-C  traMADol (ULTRAM) 50 MG tablet Take 2 tablets (100 mg total) by mouth every 6 (six) hours as needed for up to 11 days for severe pain (pain score 7-10). 07/20/23 07/31/23  Artis Delay, MD      Allergies    Temazepam    Review of Systems   Review of Systems  Constitutional:  Negative for chills, fatigue and fever.  Respiratory:  Positive for cough. Negative for chest tightness, shortness of breath and wheezing.   Cardiovascular:  Negative for chest pain and palpitations.  Gastrointestinal:  Negative for abdominal pain, constipation, diarrhea, nausea and vomiting.  Neurological:  Negative for dizziness, seizures, weakness,  light-headedness, numbness and headaches.    Physical Exam Updated Vital Signs BP (!) 106/55   Pulse 97   Temp 99.8 F (37.7 C) (Oral)   Resp 18   Ht 5\' 4"  (1.626 m)   Wt 49 kg   LMP 07/21/2023 (Approximate)   SpO2 99%   BMI 18.54 kg/m  Physical Exam Vitals and nursing note reviewed.  Constitutional:      General: She is not in acute distress.    Appearance: Normal appearance. She is not ill-appearing.  HENT:     Head: Normocephalic and atraumatic.     Right Ear: Tympanic membrane, ear canal and external ear normal.     Left Ear: Tympanic membrane, ear canal and external ear normal.     Mouth/Throat:     Mouth: Mucous membranes are moist.     Pharynx: No oropharyngeal  exudate or posterior oropharyngeal erythema.     Comments: Uvula midline. No abscess, fluctuance, erythema noted in mouth Eyes:     General: No scleral icterus.       Right eye: No discharge.        Left eye: No discharge.     Conjunctiva/sclera: Conjunctivae normal.  Cardiovascular:     Rate and Rhythm: Normal rate.     Pulses: Normal pulses.  Pulmonary:     Effort: Pulmonary effort is normal. No respiratory distress.     Breath sounds: Normal breath sounds. No stridor. No wheezing or rhonchi.     Comments: Speaking in full complete sentences without difficulty.  Maintaining oxygen saturation without supplementation Chest:     Chest wall: No tenderness.  Abdominal:     General: Bowel sounds are normal. There is no distension.     Palpations: Abdomen is soft.     Tenderness: There is no abdominal tenderness.  Musculoskeletal:     Cervical back: Normal range of motion and neck supple. No rigidity or tenderness.     Right lower leg: No edema.     Left lower leg: No edema.     Comments: No BLE swelling nor TTP.  Denna Haggard' sign negative x 2  Lymphadenopathy:     Cervical: No cervical adenopathy.  Skin:    General: Skin is warm.     Capillary Refill: Capillary refill takes less than 2 seconds.     Coloration: Skin is not jaundiced or pale.  Neurological:     Mental Status: She is alert and oriented to person, place, and time. Mental status is at baseline.     ED Results / Procedures / Treatments   Labs (all labs ordered are listed, but only abnormal results are displayed) Labs Reviewed  RESP PANEL BY RT-PCR (RSV, FLU A&B, COVID)  [id number]    EKG None  Radiology DG Chest 2 View Result Date: 07/24/2023 CLINICAL DATA:  Cough for 4 months. EXAM: CHEST - 2 VIEW COMPARISON:  March 21, 2023. FINDINGS: The heart size and mediastinal contours are within normal limits. Both lungs are clear. The visualized skeletal structures are unremarkable. IMPRESSION: No active cardiopulmonary  disease. Electronically Signed   By: Lupita Raider M.D.   On: 07/24/2023 18:16    Procedures Procedures    Medications Ordered in ED Medications - No data to display  ED Course/ Medical Decision Making/ A&P                                 Medical Decision  Making Amount and/or Complexity of Data Reviewed Radiology: ordered.  Risk OTC drugs. Prescription drug management.   Patient presents to the ED for concern of cough, this involves an extensive number of treatment options, and is a complaint that carries with it a high risk of complications and morbidity.  The differential diagnosis includes emphysema, COPD, bronchitis, asthma, GERD, medication induced, ILD   Co morbidities that complicate the patient evaluation  Tobacco and marijuana use daily Noncompliant with meds   Additional history obtained:  Additional history obtained from Nursing and Outside Medical Records   External records from outside source obtained and reviewed including triage rn note, ED note from 03/2023   Lab Tests:  I Ordered, and personally interpreted labs.  The pertinent results include:  resp panel neg   Imaging Studies ordered:  I ordered imaging studies including CXR  I independently visualized and interpreted imaging which showed no acute cardiopulmonary pathology I agree with the radiologist interpretation    Medicines ordered and prescription drug management:  I ordered medication including prednisone and dexmethorphan  for bronchitis and cough  I have reviewed the patients home medicines and have made adjustments as needed    Problem List / ED Course:  Chronic cough Acute on chronic bronchitis No recent addition of meds such as lisinopril so doubt med induced cough. Denies hx nor symptoms of GERD As she did not take prednisone as prescribed in November, will provide prednisone prescription. I discussed extensively how to take medicine as it does not work as a "as needed"  medication Symptoms likely 2/2 daily tobacco and marijuana use. May be developing COPD, emphysema No infectious symptoms. CXR negative for PNA, effusion. Does not appear to be fluid overloaded on exam. Is maintaining O2 sats without supplementation Will have patient follow up with PCP regarding further management   Reevaluation:  After the interventions noted above, I reevaluated the patient and found that they have :stayed the same   Social Determinants of Health:  Has PCP at [facility]   Dispostion:  After consideration of the diagnostic results and the patients response to treatment, I feel that the patent would benefit from outpatient management and PCP f/u.   Discussed ED workup, disposition, return to ED precautions with patient who expresses understanding agrees with plan.  All questions answered to their satisfaction.  They are agreeable to plan.  Discharge instructions provided on paperwork  Final Clinical Impression(s) / ED Diagnoses Final diagnoses:  Chronic cough  Bronchitis    Rx / DC Orders ED Discharge Orders          Ordered    dextromethorphan 15 MG/5ML syrup  3 times daily PRN        07/24/23 1849    predniSONE (STERAPRED UNI-PAK 21 TAB) 10 MG (21) TBPK tablet  Daily        07/24/23 1849              Judithann Sheen, PA 07/24/23 2337    Terald Sleeper, MD 07/25/23 1335

## 2023-07-24 NOTE — Discharge Instructions (Addendum)
 Thank you for letting us evaluate you today. Your XR was negative for pneumonia, fluid. I have provided you with steroid to reduce inflammation.  Please take this as directed on the bottle. Following 10 days, all pills should be completed.  You can throw away your old prednisone tablet bottle (ie do not take both prednisone bottles, just the one I prescribed and you will pick up).  This is important for anti-inflammatory properties for your bronchitis.  I have also sent a anticoughing medicine that you can take as needed up to 3 times a day.  Please follow up with your PCP regarding continued coughing and trying to decrease smoking.  Return to ED if you experience chest pain, shortness of breath

## 2023-07-24 NOTE — ED Triage Notes (Signed)
 Pt reports dry, nagging cough x4 months. Pt states that she was given prednisone and tessalon pearls 3 months ago but they did not help. Tabs still present in both pill bottles. Pt states that she was also dx with bronchitis but wants to be evaluated for the lingering cough.

## 2023-08-02 ENCOUNTER — Telehealth: Payer: Self-pay

## 2023-08-02 ENCOUNTER — Other Ambulatory Visit: Payer: Self-pay | Admitting: Hematology and Oncology

## 2023-08-02 MED ORDER — TRAMADOL HCL 50 MG PO TABS: 100.0000 mg | ORAL_TABLET | Freq: Four times a day (QID) | ORAL | 0 refills | Status: DC | PRN

## 2023-08-02 NOTE — Telephone Encounter (Signed)
 done

## 2023-08-02 NOTE — Telephone Encounter (Signed)
 Called and told Rx sent. She verbalized understanding.

## 2023-08-02 NOTE — Telephone Encounter (Signed)
 She left a message requesting tramadol refill to [facility] on [address].

## 2023-08-05 ENCOUNTER — Other Ambulatory Visit: Payer: Self-pay | Admitting: Infectious Diseases

## 2023-08-05 DIAGNOSIS — K649 Unspecified hemorrhoids: Secondary | ICD-10-CM

## 2023-08-05 NOTE — Telephone Encounter (Signed)
 Okay to refill?

## 2023-08-13 ENCOUNTER — Other Ambulatory Visit: Payer: Self-pay | Admitting: Physician Assistant

## 2023-08-13 ENCOUNTER — Telehealth: Payer: Self-pay

## 2023-08-13 MED ORDER — TRAMADOL HCL 50 MG PO TABS: 100.0000 mg | ORAL_TABLET | Freq: Four times a day (QID) | ORAL | 0 refills | Status: DC | PRN

## 2023-08-13 NOTE — Telephone Encounter (Signed)
 She called and left a message requesting Tramadol refill to [facility] on [address].

## 2023-08-13 NOTE — Progress Notes (Signed)
 Refill sent to pharmacy for Tramadol  per patient's request. I have reviewed the PDMP during this encounter.

## 2023-08-13 NOTE — Telephone Encounter (Signed)
 Called and left a message Rx sent to pharmacy.

## 2023-08-24 ENCOUNTER — Other Ambulatory Visit: Payer: Self-pay | Admitting: Hematology and Oncology

## 2023-08-24 ENCOUNTER — Telehealth: Payer: Self-pay

## 2023-08-24 MED ORDER — TRAMADOL HCL 50 MG PO TABS: 100.0000 mg | ORAL_TABLET | Freq: Four times a day (QID) | ORAL | 0 refills | Status: DC | PRN

## 2023-08-24 NOTE — Telephone Encounter (Signed)
 She called and left a message requesting Tramadol  Rx to [facility] on [address] please.

## 2023-08-24 NOTE — Telephone Encounter (Signed)
Called and given below message Rx sent. She verbalized understanding.

## 2023-09-03 ENCOUNTER — Telehealth: Payer: Self-pay

## 2023-09-03 ENCOUNTER — Other Ambulatory Visit: Payer: Self-pay | Admitting: Hematology and Oncology

## 2023-09-03 MED ORDER — TRAMADOL HCL 50 MG PO TABS: 100.0000 mg | ORAL_TABLET | Freq: Four times a day (QID) | ORAL | 0 refills | Status: DC | PRN

## 2023-09-03 NOTE — Telephone Encounter (Signed)
Called and left a message Rx sent. Ask her to call the office for questions. 

## 2023-09-03 NOTE — Telephone Encounter (Signed)
 She called and left a message requesting Tramadol refill to [facility] on [address] please.

## 2023-09-14 ENCOUNTER — Other Ambulatory Visit: Payer: Self-pay | Admitting: Hematology and Oncology

## 2023-09-14 ENCOUNTER — Telehealth: Payer: Self-pay

## 2023-09-14 MED ORDER — TRAMADOL HCL 50 MG PO TABS: 100.0000 mg | ORAL_TABLET | Freq: Four times a day (QID) | ORAL | 0 refills | Status: DC | PRN

## 2023-09-14 NOTE — Telephone Encounter (Signed)
 Called and left a message Rx sent.

## 2023-09-14 NOTE — Telephone Encounter (Signed)
 She called and left a message requesting Tramadol refill to [facility] on [address] please.

## 2023-09-21 DIAGNOSIS — N839 Noninflammatory disorder of ovary, fallopian tube and broad ligament, unspecified: Secondary | ICD-10-CM | POA: Diagnosis not present

## 2023-09-24 ENCOUNTER — Inpatient Hospital Stay: Attending: Licensed Clinical Social Worker | Admitting: Licensed Clinical Social Worker

## 2023-09-24 ENCOUNTER — Other Ambulatory Visit: Payer: Self-pay

## 2023-09-24 DIAGNOSIS — B20 Human immunodeficiency virus [HIV] disease: Secondary | ICD-10-CM

## 2023-09-24 DIAGNOSIS — C8118 Nodular sclerosis classical Hodgkin lymphoma, lymph nodes of multiple sites: Secondary | ICD-10-CM

## 2023-09-24 NOTE — Progress Notes (Signed)
 She showed up in the lobby with no appt upset that she received a letter from the social security administration that her disability will stop on 7/31. Sent referral to Child psychotherapist and given phone # for medical records

## 2023-09-24 NOTE — Progress Notes (Signed)
 [facility] Clinical Social Work  Clinical Social Work was referred by Engineer, civil (consulting) for issue with disability.  Clinical Social Worker contacted patient by phone to offer support and assess for needs.    Pt received a letter stating that her SSI is terminating in July due to her health improving. Pt is trying to get her medical records from [facility] sent to the office to help appeal.  CSW informed pt to ensure she gets her records from any other non-[facility] doctors as well.  Also provided contact number for Legal Aid as they have a program to help with denial & termination of government benefits.  Pt voiced understanding.     [name] E Jonanthony Nahar, LCSW  Clinical Social Worker [facility]

## 2023-09-25 ENCOUNTER — Encounter: Payer: Self-pay | Admitting: Hematology and Oncology

## 2023-09-27 ENCOUNTER — Telehealth: Payer: Self-pay

## 2023-09-27 ENCOUNTER — Other Ambulatory Visit: Payer: Self-pay | Admitting: Hematology and Oncology

## 2023-09-27 MED ORDER — TRAMADOL HCL 50 MG PO TABS: 100.0000 mg | ORAL_TABLET | Freq: Four times a day (QID) | ORAL | 0 refills | Status: DC | PRN

## 2023-09-27 NOTE — Telephone Encounter (Signed)
She called and left a message requesting Tramadol refill to [facility].

## 2023-09-27 NOTE — Telephone Encounter (Signed)
 Called and told prescription sent and letter is ready for pick up from Dr. Marton Sleeper for disability. She verbalized understanding and will pick up letter out front in the lobby today.

## 2023-09-30 ENCOUNTER — Encounter: Payer: Self-pay | Admitting: Licensed Clinical Social Worker

## 2023-09-30 NOTE — Progress Notes (Signed)
 [facility] CSW Progress Note  Clinical Child psychotherapist contacted patient by phone to follow-up on Legal Aid. Pt has not been able to reach anyone by phone. CSW shared information on applying for assistance through their website.  CSW attempted to contact Legal Aid by phone to make a referral. Unable to reach anyone by phone and call was ended by Legal Aid after 30 minutes on hold.    Keyion Knack E Lorrayne Ismael, LCSW Clinical Social Worker [facility]

## 2023-10-01 ENCOUNTER — Telehealth: Payer: Self-pay | Admitting: Licensed Clinical Social Worker

## 2023-10-01 NOTE — Telephone Encounter (Signed)
 TC to patient after she tried completing online intake for Legal Aid and it was closed. Pt also tried calling social security to find out her case worker and was told she does not have one even though the letter she received said benefits would end in July. Pt stated that the appeal form should be arriving to her tomorrow and she will fill it out.  CSW re-emphasized getting documents from all of her providers for both physical and mental health.  Pt is going to [facility] to get documents related to her mental health treatment.  CSW will notify pt if I am able to reach Legal Aid on her behalf.   [name] E Allison Pitta, LCSW

## 2023-10-06 ENCOUNTER — Ambulatory Visit: Admitting: Obstetrics and Gynecology

## 2023-10-07 ENCOUNTER — Other Ambulatory Visit: Payer: Self-pay | Admitting: Hematology and Oncology

## 2023-10-07 ENCOUNTER — Telehealth: Payer: Self-pay

## 2023-10-07 MED ORDER — TRAMADOL HCL 50 MG PO TABS: 100.0000 mg | ORAL_TABLET | Freq: Four times a day (QID) | ORAL | 0 refills | Status: DC | PRN

## 2023-10-07 NOTE — Telephone Encounter (Signed)
 She called and left a message requesting Tramadol refill to [facility] on [address].

## 2023-10-19 ENCOUNTER — Telehealth: Payer: Self-pay

## 2023-10-19 ENCOUNTER — Other Ambulatory Visit: Payer: Self-pay | Admitting: Hematology and Oncology

## 2023-10-19 MED ORDER — TRAMADOL HCL 50 MG PO TABS: 100.0000 mg | ORAL_TABLET | Freq: Four times a day (QID) | ORAL | 0 refills | Status: DC | PRN

## 2023-10-19 NOTE — Telephone Encounter (Signed)
 Called and given below message. She verbalized understanding.

## 2023-10-19 NOTE — Telephone Encounter (Signed)
 She called and left a message requesting Tramadol refill to [facility] on [address].

## 2023-10-25 ENCOUNTER — Other Ambulatory Visit: Payer: Self-pay

## 2023-10-25 ENCOUNTER — Emergency Department (HOSPITAL_COMMUNITY)

## 2023-10-25 ENCOUNTER — Emergency Department (HOSPITAL_COMMUNITY)
Admission: EM | Admit: 2023-10-25 | Discharge: 2023-10-25 | Disposition: A | Discharge status: Home / Self Care | Attending: Emergency Medicine | Admitting: Emergency Medicine

## 2023-10-25 ENCOUNTER — Encounter (HOSPITAL_COMMUNITY): Payer: Self-pay | Admitting: Emergency Medicine

## 2023-10-25 DIAGNOSIS — M25572 Pain in left ankle and joints of left foot: Secondary | ICD-10-CM | POA: Insufficient documentation

## 2023-10-25 MED ORDER — APIXABAN 2.5 MG PO TABS
10.0000 mg | ORAL_TABLET | Freq: Once | ORAL | Status: AC
  Administered 2023-10-25: 10 mg via ORAL
  Filled 2023-10-25: qty 4

## 2023-10-25 NOTE — Discharge Instructions (Addendum)
 It was a pleasure taking part in your care.  As discussed, please return to the ED in the morning either at [facility] or [facility] for outpatient ultrasound of left leg to rule out blood clot.  If you develop chest pain or shortness of breath please return to the ED prior to that.  You may use ibuprofen  and Tylenol  for pain at home.  If tomorrow is a Saturday, Sunday or holiday, please go to the [facility] Emergency Department Registration Desk at 11 am tomorrow morning and tell them you are there for a vascular study.  If tomorrow is a weekday (Monday-Friday), please go to the [facility] (address [address] [place]) at 8 am and report to the 4th floor registration Zone A.  Inform registration that you are there for a vascular study.

## 2023-10-25 NOTE — ED Provider Notes (Signed)
 [place] EMERGENCY DEPARTMENT AT [facility] Provider Note   CSN: [id number] Arrival date & time: 10/25/23  2124     Patient presents with: Ankle Pain   [name] is a 38 y.o. female who presents to the ED for evaluation of left ankle injury pain and swelling.  States that she noticed about 30 minutes prior to arrival that her left ankle was swollen.  She denies any preceding trauma or event to account for the swelling or pain.  She rates her pain 2 out of 10.  She states she is able to ambulate on the leg without difficulty.  She denies any chest pain or shortness of breath.  Denies any fevers.  Denies any knee pain on left side.  Denies history of blood clots or DVTs.  Reports that prior to this, she had been getting her nails done all day and had been elevating her legs.    Ankle Pain      Prior to Admission medications   Medication Sig Start Date End Date Taking? Authorizing Provider  bictegravir-emtricitabine -tenofovir  AF (BIKTARVY ) 50-200-25 MG TABS tablet Take 1 tablet by mouth daily. 12/23/22   Efrain Lamar ORN, MD  Brimonidine Tartrate (LUMIFY) 0.025 % SOLN Apply to eye.    [provider]  cholecalciferol  (VITAMIN D ) 1000 units tablet Take 1 tablet (1,000 Units total) by mouth daily. 06/05/16   Lonn Hicks, MD  dextromethorphan  15 MG/5ML syrup Take 10 mLs (30 mg total) by mouth 3 (three) times daily as needed for cough. 07/24/23   Minnie Tinnie BRAVO, PA  hydrocortisone  (ANUSOL -HC) 2.5 % rectal cream APPLY RECTALLY TO HEMORRHOIDS TWICE DAILY 08/05/23   Melvenia Corean SAILOR, NP  lidocaine  (LIDODERM ) 5 % Place 1 patch onto the skin daily. Remove & Discard patch within 12 hours or as directed by MD 06/14/23   Keith Sor, PA-C  mirtazapine  (REMERON ) 15 MG tablet Take 1 tablet (15 mg total) by mouth at bedtime. 05/13/23   Lonn Hicks, MD  ondansetron  (ZOFRAN ) 4 MG tablet Take 1 tablet (4 mg total) by mouth every 8 (eight) hours as needed for nausea. 06/09/23   Lonn Hicks, MD  predniSONE  (STERAPRED UNI-PAK 21 TAB) 10 MG (21) TBPK tablet Take by mouth daily. Take 6 tabs by mouth daily  for 2 days, then 5 tabs for 2 days, then 4 tabs for 2 days, then 3 tabs for 2 days, 2 tabs for 2 days, then 1 tab by mouth daily for 2 days 06/14/23   Keith Sor, PA-C  predniSONE  (STERAPRED UNI-PAK 21 TAB) 10 MG (21) TBPK tablet Take by mouth daily. Take 6 tabs by mouth daily  for 2 days, then 5 tabs for 2 days, then 4 tabs for 2 days, then 3 tabs for 2 days, 2 tabs for 2 days, then 1 tab by mouth daily for 2 days 07/24/23   Minnie Tinnie BRAVO, PA  traMADol  (ULTRAM ) 50 MG tablet Take 2 tablets (100 mg total) by mouth every 6 (six) hours as needed for up to 11 days for severe pain (pain score 7-10). 10/19/23 10/30/23  Lonn Hicks, MD    Allergies: Temazepam     Review of Systems  Musculoskeletal:  Positive for arthralgias.  All other systems reviewed and are negative.   Updated Vital Signs BP 104/65 (BP Location: Right Arm)   Pulse 91   Temp 97.9 F (36.6 C) (Oral)   Resp 17   Wt 49 kg   LMP 10/09/2023   SpO2  100%   BMI 18.54 kg/m   Physical Exam Vitals and nursing note reviewed.  Constitutional:      General: She is not in acute distress.    Appearance: She is well-developed.  HENT:     Head: Normocephalic and atraumatic.   Eyes:     Conjunctiva/sclera: Conjunctivae normal.    Cardiovascular:     Rate and Rhythm: Normal rate and regular rhythm.     Heart sounds: No murmur heard. Pulmonary:     Effort: Pulmonary effort is normal. No respiratory distress.     Breath sounds: Normal breath sounds.  Abdominal:     Palpations: Abdomen is soft.     Tenderness: There is no abdominal tenderness.   Musculoskeletal:        General: No swelling.     Cervical back: Neck supple.     Comments: Left ankle with swelling compared to right.  Intact ROM.  No pain with ranging of left ankle.  2+ DP pulse.  Brisk cap refill.   Skin:    General: Skin is warm and dry.      Capillary Refill: Capillary refill takes less than 2 seconds.   Neurological:     Mental Status: She is alert and oriented to person, place, and time. Mental status is at baseline.   Psychiatric:        Mood and Affect: Mood normal.     (all labs ordered are listed, but only abnormal results are displayed) Labs Reviewed - No data to display  EKG: None  Radiology: DG Ankle Complete Left Result Date: 10/25/2023 CLINICAL DATA:  Lateral left ankle pain and swelling. EXAM: LEFT ANKLE COMPLETE - 3+ VIEW COMPARISON:  None Available. FINDINGS: There is no evidence of fracture, dislocation, or joint effusion. There is no evidence of arthropathy or other focal bone abnormality. Soft tissues are unremarkable. IMPRESSION: Negative. Electronically Signed   By: Suzen Dials M.D.   On: 10/25/2023 21:56     Procedures   Medications Ordered in the ED - No data to display  Medical Decision Making Amount and/or Complexity of Data Reviewed Radiology: ordered.   38 year old female presents for evaluation.  Please see HPI for further details.  On exam the patient is afebrile and nontachycardic.  Her lung sounds are clear bilaterally, she has not hypoxic.  Her left ankle is swollen compared to the right.  She has intact ROM.  She is able to ambulate on the left leg.  She has no calf tenderness.  X-ray imaging ordered in triage is negative for any kind of acute process.  Patient will be scheduled for DVT outpatient ultrasound, advised to take ibuprofen  and Tylenol  for pain.  Will provide patient with one-time dose of Eliquis here in the department.  Advised to elevate her left leg.  Advised to follow-up in the morning for outpatient ultrasound study.  Patient amenable to plan.  Stable to discharge.    Final diagnoses:  Acute left ankle pain    ED Discharge Orders          Ordered    [place] [facility]        10/25/23 2333               Ruthell Lonni FALCON, PA-C 10/25/23 7664     Jerral Meth, MD 10/26/23 [phone]

## 2023-10-25 NOTE — ED Triage Notes (Signed)
 Pt in ambulatory with L lateral ankle pain and swelling onset ago. Denies any known injury

## 2023-10-26 ENCOUNTER — Ambulatory Visit (HOSPITAL_COMMUNITY)
Admission: RE | Admit: 2023-10-26 | Discharge: 2023-10-26 | Disposition: A | Discharge status: Home / Self Care | Source: Ambulatory Visit | Attending: Vascular Surgery | Admitting: Vascular Surgery

## 2023-10-26 DIAGNOSIS — R609 Edema, unspecified: Secondary | ICD-10-CM

## 2023-10-26 DIAGNOSIS — R600 Localized edema: Secondary | ICD-10-CM | POA: Diagnosis not present

## 2023-11-01 ENCOUNTER — Other Ambulatory Visit: Payer: Self-pay | Admitting: *Deleted

## 2023-11-01 MED ORDER — TRAMADOL HCL 50 MG PO TABS: 100.0000 mg | ORAL_TABLET | Freq: Four times a day (QID) | ORAL | 0 refills | Status: DC | PRN

## 2023-11-05 ENCOUNTER — Ambulatory Visit: Admitting: Nurse Practitioner

## 2023-11-09 ENCOUNTER — Ambulatory Visit: Admitting: Obstetrics and Gynecology

## 2023-11-11 ENCOUNTER — Other Ambulatory Visit: Payer: Self-pay | Admitting: Hematology and Oncology

## 2023-11-11 ENCOUNTER — Inpatient Hospital Stay: Payer: 59

## 2023-11-11 ENCOUNTER — Inpatient Hospital Stay: Payer: 59 | Attending: Licensed Clinical Social Worker | Admitting: Hematology and Oncology

## 2023-11-11 VITALS — BP 99/64 | HR 79 | Temp 98.3°F | Resp 18 | Ht 64.0 in | Wt 104.0 lb

## 2023-11-11 DIAGNOSIS — C8118 Nodular sclerosis classical Hodgkin lymphoma, lymph nodes of multiple sites: Secondary | ICD-10-CM | POA: Diagnosis not present

## 2023-11-11 DIAGNOSIS — R634 Abnormal weight loss: Secondary | ICD-10-CM | POA: Diagnosis not present

## 2023-11-11 DIAGNOSIS — C811A Nodular sclerosis Hodgkin lymphoma, in remission: Secondary | ICD-10-CM | POA: Diagnosis present

## 2023-11-11 DIAGNOSIS — G893 Neoplasm related pain (acute) (chronic): Secondary | ICD-10-CM | POA: Diagnosis not present

## 2023-11-11 DIAGNOSIS — F17210 Nicotine dependence, cigarettes, uncomplicated: Secondary | ICD-10-CM | POA: Insufficient documentation

## 2023-11-11 DIAGNOSIS — D539 Nutritional anemia, unspecified: Secondary | ICD-10-CM | POA: Insufficient documentation

## 2023-11-11 LAB — CMP (CANCER CENTER ONLY)
ALT: 13 U/L (ref 0–44)
AST: 13 U/L — ABNORMAL LOW (ref 15–41)
Albumin: 3.3 g/dL — ABNORMAL LOW (ref 3.5–5.0)
Alkaline Phosphatase: 94 U/L (ref 38–126)
Anion gap: 6 (ref 5–15)
BUN: 11 mg/dL (ref 6–20)
CO2: 30 mmol/L (ref 22–32)
Calcium: 9.1 mg/dL (ref 8.9–10.3)
Chloride: 101 mmol/L (ref 98–111)
Creatinine: 0.83 mg/dL (ref 0.44–1.00)
GFR, Estimated: >60 mL/min (ref >60)
Glucose, Bld: 85 mg/dL (ref 70–99)
Potassium: 3.6 mmol/L (ref 3.5–5.1)
Sodium: 137 mmol/L (ref 135–145)
Total Bilirubin: 0.3 mg/dL (ref 0.0–1.2)
Total Protein: 8 g/dL (ref 6.5–8.1)

## 2023-11-11 LAB — CBC WITH DIFFERENTIAL (CANCER CENTER ONLY)
Abs Immature Granulocytes: 0.03 K/uL (ref 0.00–0.07)
Basophils Absolute: 0.1 K/uL (ref 0.0–0.1)
Basophils Relative: 1 %
Eosinophils Absolute: 0.2 K/uL (ref 0.0–0.5)
Eosinophils Relative: 2 %
HCT: 31.7 % — ABNORMAL LOW (ref 36.0–46.0)
Hemoglobin: 10.3 g/dL — ABNORMAL LOW (ref 12.0–15.0)
Immature Granulocytes: 0 %
Lymphocytes Relative: 9 %
Lymphs Abs: 0.7 K/uL (ref 0.7–4.0)
MCH: 29.2 pg (ref 26.0–34.0)
MCHC: 32.5 g/dL (ref 30.0–36.0)
MCV: 89.8 fL (ref 80.0–100.0)
Monocytes Absolute: 0.9 K/uL (ref 0.1–1.0)
Monocytes Relative: 11 %
Neutro Abs: 5.8 K/uL (ref 1.7–7.7)
Neutrophils Relative %: 77 %
Platelet Count: 459 K/uL — ABNORMAL HIGH (ref 150–400)
RBC: 3.53 MIL/uL — ABNORMAL LOW (ref 3.87–5.11)
RDW: 16.3 % — ABNORMAL HIGH (ref 11.5–15.5)
WBC Count: 7.6 K/uL (ref 4.0–10.5)
nRBC: 0 % (ref 0.0–0.2)

## 2023-11-11 MED ORDER — MIRTAZAPINE 15 MG PO TABS
15.0000 mg | ORAL_TABLET | Freq: Every day | ORAL | 1 refills | Status: DC
Start: 2023-11-11 — End: 2024-01-12

## 2023-11-11 NOTE — Assessment & Plan Note (Addendum)
 She has multiple chronic joint pain and is dependent on tramadol  to control her joint pain We discussed refill policy.

## 2023-11-11 NOTE — Assessment & Plan Note (Addendum)
 She had recent anemia She has mild intermittent rectal bleeding I suspect there is a component of iron deficiency I recommend oral iron supplement over-the-counter and diet rich with iron

## 2023-11-11 NOTE — Assessment & Plan Note (Addendum)
 She is not eating enough We discussed additional supplement Previously, we discussed the role of Remeron  but she has not been taking it She is interested to try Remeron  and I refilled her prescription

## 2023-11-11 NOTE — Progress Notes (Signed)
 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, CD30 positive She has complete response with no signs of recurrent disease since 2019  She is being follow-up Clinically in remission with no signs or symptoms to suggest recurrent disease  Weight loss She is not eating enough We discussed additional supplement Previously, we discussed the role of Remeron  but she has not been taking it She is interested to try Remeron  and I refilled her prescription Deficiency anemia She had recent anemia She has mild intermittent rectal bleeding I suspect there is a component of iron deficiency I recommend oral iron supplement over-the-counter and diet rich with iron Cigarette smoker Discussed cessation and its importance Cancer associated pain She has multiple chronic joint pain and is dependent on tramadol  to control her joint pain We discussed refill policy.  Orders Placed This Encounter  Procedures   CBC with Differential (Cancer Center Only)    Standing Status:   Future    Expiration Date:   11/10/2024   CMP  (Cancer Center only)    Standing Status:   Future    Expiration Date:   11/10/2024   Ferritin    Standing Status:   Future    Expiration Date:   11/10/2024   Iron and Iron Binding Capacity ([facility] only)    Standing Status:   Future    Expiration Date:   11/10/2024   Sedimentation rate    Standing Status:   Future    Expiration Date:   11/10/2024     Almarie Bedford, MD  INTERVAL HISTORY: she returns for surveillance follow-up for high risk Hodgkin lymphoma She is here accompanied by her partner She continues to have chronic joint pain Unfortunately, she is still smoking and was recently placed on some steroid treatment due to coughing No recent fever or chills No new lymphadenopathy  PHYSICAL EXAMINATION: ECOG PERFORMANCE STATUS: 1 - Symptomatic but completely ambulatory  Vitals:   11/11/23 0918  BP: 99/64  Pulse: 79  Resp: 18  Temp: 98.3 F (36.8 C)  SpO2: 100%   Filed Weights   11/11/23 0918  Weight: 104 lb (47.2 kg)   GENERAL:alert, no distress and comfortable SKIN: skin color, texture, turgor are normal, no rashes or significant lesions EYES: normal, conjunctiva are pink and non-injected, sclera clear OROPHARYNX:no exudate, no erythema and lips, buccal mucosa, and tongue normal  NECK: supple, thyroid  normal size, non-tender, without nodularity LYMPH:  no palpable lymphadenopathy in the cervical, axillary or inguinal LUNGS: clear to auscultation and percussion with normal breathing effort HEART: regular rate & rhythm and no murmurs and no lower extremity edema ABDOMEN:abdomen soft, non-tender and normal bowel sounds Musculoskeletal:no cyanosis of digits and no clubbing  PSYCH: alert & oriented x 3 with fluent speech NEURO:  no focal motor/sensory deficits  Relevant data reviewed during this visit included CBC and CMP

## 2023-11-11 NOTE — Assessment & Plan Note (Addendum)
 The patient was diagnosed with Hodgkin lymphoma in 2016 in the setting of HIV disease Treatment course was complicated by severe anemia, noncompliance with appointment etc. 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, CD30 positive She has complete response with no signs of recurrent disease since 2019  She is being follow-up Clinically in remission with no signs or symptoms to suggest recurrent disease

## 2023-11-11 NOTE — Assessment & Plan Note (Addendum)
 Discussed cessation and its importance

## 2023-11-12 ENCOUNTER — Telehealth: Payer: Self-pay

## 2023-11-12 ENCOUNTER — Other Ambulatory Visit: Payer: Self-pay | Admitting: Hematology and Oncology

## 2023-11-12 MED ORDER — TRAMADOL HCL 50 MG PO TABS: 100.0000 mg | ORAL_TABLET | Freq: Four times a day (QID) | ORAL | 0 refills | Status: DC | PRN

## 2023-11-12 NOTE — Telephone Encounter (Signed)
 Pt called to request refill on Tramadol  50 mg 2 tabs PO q6h PRN #90 at [facility] [address]. Last refilled 11/01/23. Called pt and LVM advising this request was forwarded to MD.

## 2023-11-15 ENCOUNTER — Ambulatory Visit: Admitting: Obstetrics and Gynecology

## 2023-11-16 ENCOUNTER — Ambulatory Visit: Payer: Self-pay | Admitting: General Surgery

## 2023-11-16 NOTE — H&P (Signed)
 REFERRING PHYSICIAN:  Emilio Domino   PROVIDER:  BERNARDA WANDA NED, MD   MRN: [id number] DOB: [birth year] DATE OF ENCOUNTER: 11/16/2023   Subjective    Chief Complaint: Recheck (8 week ltfu- Hemorrhoid/constipation/)       History of Present Illness:   .  38 year old female who presented to the office for evaluation of anal pain with bowel movements.  She states that she was constipated and sat on the toilet for 20 to 25 minutes twice a day straining to have bowel movements.  She denied any rectal bleeding.  I recommended that she start using a fiber supplement on a daily basis.  She has done this over the last 8 weeks and has noticed a significant difference in her bowel movements.  She is having no further pain with bowel movements and no further constipation.   Review of Systems: A complete review of systems was obtained from the patient.  I have reviewed this information and discussed as appropriate with the patient.  See HPI as well for other ROS.     Medical History: Past Medical History      Past Medical History:  Diagnosis Date   History of cancer      NHL: in remission   HIV -AIDS with opportunistic infection, Symptomatic (CMS/HHS-HCC)           Problem List  There is no problem list on file for this patient.      Past Surgical History       Past Surgical History:  Procedure Laterality Date   PERCUTANEOUS PORTAL VEIN CATHETERIZATION   2016        Allergies       Allergies  Allergen Reactions   Temazepam  Other (See Comments)      Reaction:  Confusion/dizziness    Reaction:  Confusion/dizziness        Medications Ordered Prior to Encounter        Current Outpatient Medications on File Prior to Visit  Medication Sig Dispense Refill   BIKTARVY  50-200-25 mg tablet Take 1 tablet by mouth once daily       hydrocortisone  (ANUSOL -HC) 2.5 % rectal cream APPLY RECTALLY TO HEMORRHOIDS TWICE DAILY       prenatal vit-iron fum-folic ac (PRENAVITE) tablet  Take 1 tablet by mouth once daily       VITAMIN D3 ORAL Take by mouth        No current facility-administered medications on file prior to visit.        Family History  History reviewed. No pertinent family history.      Tobacco Use History  Social History        Tobacco Use  Smoking Status Every Day   Types: Cigarettes  Smokeless Tobacco Never        Social History  Social History         Socioeconomic History   Marital status: Life Partner  Tobacco Use   Smoking status: Every Day      Types: Cigarettes   Smokeless tobacco: Never  Vaping Use   Vaping status: Never Used  Substance and Sexual Activity   Alcohol use: Yes   Drug use: Never        Objective:         Vitals:    11/16/23 0918  PainSc: 0-No pain      Exam Gen: NAD Abd: soft Rectal: L lateral prolapsing anal polyp     Labs, Imaging and Diagnostic Testing:   Procedure:  Anoscopy (08/31/23) Surgeon: Debby After the risks and benefits were explained, written consent was obtained for above procedure.  A medical assistant chaperone was present thoroughout the entire procedure.  Anesthesia: none Diagnosis: anal pain Findings: Prolapsing left lateral anal polyp, no sign of external hemorrhoid disease or internal hemorrhoid disease.     Assessment and Plan:  Diagnoses and all orders for this visit:   Anal polyp She does have a prolapsing polyp in the left lateral anal canal.  We discussed excision of this.  We discussed risks of bleeding, pain and recurrence.   All questions were answered Constipation, unspecified constipation type Cont using a fiber supplement on a daily basis to help with her constipation.       Bernarda JAYSON Debby, MD Colon and Rectal Surgery [facility] Surgery

## 2023-11-23 ENCOUNTER — Other Ambulatory Visit: Payer: Self-pay | Admitting: Hematology and Oncology

## 2023-11-23 ENCOUNTER — Telehealth: Payer: Self-pay

## 2023-11-23 MED ORDER — TRAMADOL HCL 50 MG PO TABS: 100.0000 mg | ORAL_TABLET | Freq: Four times a day (QID) | ORAL | 0 refills | Status: DC | PRN

## 2023-11-23 NOTE — Telephone Encounter (Signed)
 Pt called to request tramadol  refill to be sent to [facility] on [address]. Called and LVM for pt to advise I would route request to MD.

## 2023-11-25 ENCOUNTER — Ambulatory Visit: Admitting: Infectious Diseases

## 2023-11-29 ENCOUNTER — Telehealth: Payer: Self-pay

## 2023-11-29 NOTE — Telephone Encounter (Signed)
 LVM with patient concerning request for Dr. Lonn to provide letter for her to obtain service animal.  Patient informed that she will need to follow-up and discuss this with her PCP at this time.

## 2023-12-06 ENCOUNTER — Other Ambulatory Visit (HOSPITAL_COMMUNITY): Payer: Self-pay | Admitting: Hematology and Oncology

## 2023-12-06 MED ORDER — TRAMADOL HCL 50 MG PO TABS: 100.0000 mg | ORAL_TABLET | Freq: Four times a day (QID) | ORAL | 0 refills | Status: DC | PRN

## 2023-12-07 DIAGNOSIS — Z419 Encounter for procedure for purposes other than remedying health state, unspecified: Secondary | ICD-10-CM | POA: Diagnosis not present

## 2023-12-13 NOTE — Progress Notes (Unsigned)
 Subjective:  Chief complaint: follow-up for HIV disease on medications   Patient ID: [name], female    DOB: 1985/08/16, 38 y.o.   MRN: [id number]  HPI  Past Medical History:  Diagnosis Date   AIN III (anal intraepithelial neoplasia III)    Anemia    Cancer (HCC)    Hodgkin lymphoma   Chest wall pain 06/27/2015   Condyloma acuminatum in female    Depression    History of chronic bronchitis    History of esophagitis    CANDIDA   History of shingles    HIV (human immunodeficiency virus infection) (HCC)    Hodgkin's lymphoma (HCC) 06/12/2014   HSV (herpes simplex virus) infection    Hypokalemia 07/17/2014   Periodontitis, chronic    Screening examination for venereal disease 10/30/2013    Past Surgical History:  Procedure Laterality Date   DIAGNOSTIC LAPAROSCOPY WITH REMOVAL OF ECTOPIC PREGNANCY N/A 11/17/2015   Procedure: LAPAROSCOPY LEFT  SALPINGECTOMY SECONDARY TO LEFT ECTOPIC PREGNANCY;  Surgeon: Norleen LULLA Server, MD;  Location: [facility];  Service: Gynecology;  Laterality: N/A;   DILATION AND CURETTAGE OF UTERUS  2005   MISSED AB   EXAMINATION UNDER ANESTHESIA N/A 09/23/2012   Procedure: EXAM UNDER ANESTHESIA;  Surgeon: Elspeth KYM Schultze, MD;  Location: [facility];  Service: General;  Laterality: N/A;   IR GENERIC HISTORICAL  05/26/2016   IR FLUORO GUIDE PORT INSERTION RIGHT 05/26/2016 [facility]   IR GENERIC HISTORICAL  05/26/2016   IR US  GUIDE VASC ACCESS RIGHT 05/26/2016 [facility]   IR REMOVAL TUN ACCESS W/ PORT W/O FL MOD SED  09/19/2019   LASER ABLATION CONDOLAMATA N/A 09/23/2012   Procedure: REMOVAL/ABLATION  ABLATION CONDOLAMATA WARTS;  Surgeon: Elspeth KYM Schultze, MD;  Location: [facility];  Service: General;  Laterality: N/A;    Family History  Problem Relation Age of Onset   Cancer Maternal Aunt        lung   Cancer Maternal Grandmother        unknown ca      Social History   Socioeconomic History   Marital status:  Significant Other    Spouse name: Not on file   Number of children: Not on file   Years of education: Not on file   Highest education level: Not on file  Occupational History   Not on file  Tobacco Use   Smoking status: Heavy Smoker    Current packs/day: 1.00    Average packs/day: 1 pack/day for 9.7 years (9.7 ttl pk-yrs)    Types: Cigars, Cigarettes    Start date: 03/19/2014   Smokeless tobacco: Never   Tobacco comments:    she smokes 3 Black and Mild Cigars daily  Vaping Use   Vaping status: Never Used  Substance and Sexual Activity   Alcohol use: Not Currently   Drug use: Yes    Types: Marijuana    Comment: 2 blunts per day   Sexual activity: Yes    Partners: Male    Birth control/protection: None    Comment: 1ST intercourse- 17, partners- 7, declined condoms  Other Topics Concern   Not on file  Social History Narrative   Not on file   Social Drivers of Health   Financial Resource Strain: Not on file  Food Insecurity: Not on file  Transportation Needs: Not on file  Physical Activity: Not on file  Stress: Not on file  Social Connections: Not on file  Allergies  Allergen Reactions   Temazepam  Other (See Comments)    Reaction:  Confusion/dizziness      Current Outpatient Medications:    bictegravir-emtricitabine -tenofovir  AF (BIKTARVY ) 50-200-25 MG TABS tablet, Take 1 tablet by mouth daily., Disp: 30 tablet, Rfl: 11   Brimonidine Tartrate (LUMIFY) 0.025 % SOLN, Apply to eye., Disp: , Rfl:    cholecalciferol  (VITAMIN D ) 1000 units tablet, Take 1 tablet (1,000 Units total) by mouth daily., Disp: 30 tablet, Rfl: 9   hydrocortisone  (ANUSOL -HC) 2.5 % rectal cream, APPLY RECTALLY TO HEMORRHOIDS TWICE DAILY, Disp: 30 g, Rfl: 1   lidocaine  (LIDODERM ) 5 %, Place 1 patch onto the skin daily. Remove & Discard patch within 12 hours or as directed by MD, Disp: 30 patch, Rfl: 0   mirtazapine  (REMERON ) 15 MG tablet, Take 1 tablet (15 mg total) by mouth at bedtime., Disp: 30  tablet, Rfl: 1   ondansetron  (ZOFRAN ) 4 MG tablet, Take 1 tablet (4 mg total) by mouth every 8 (eight) hours as needed for nausea., Disp: 30 tablet, Rfl: 1   traMADol  (ULTRAM ) 50 MG tablet, Take 2 tablets (100 mg total) by mouth every 6 (six) hours as needed for up to 11 days for severe pain (pain score 7-10)., Disp: 90 tablet, Rfl: 0    Review of Systems     Objective:   Physical Exam        Assessment & Plan:

## 2023-12-14 NOTE — Progress Notes (Signed)
 COVID Vaccine received:  []  No [x]  Yes Date of any COVID positive Test in last 90 days: no PCP - Clayborne Molt @ [facility] Cardiologist -   Chest x-ray - 07/24/23 Epic EKG -   Stress Test -  ECHO - 06/15/14 Epic Cardiac Cath -   Bowel Prep - [x]  No  []   Yes ______  Pacemaker / ICD device [x]  No []  Yes   Spinal Cord Stimulator:[x]  No []  Yes       History of Sleep Apnea? [x]  No []  Yes   CPAP used?- [x]  No []  Yes    Does the patient monitor blood sugar?          [x]  No []  Yes  []  N/A  Patient has: [x]  NO Hx DM   []  Pre-DM                 []  DM1  []   DM2 Does patient have a [employer] or Dexacom? []  No []  Yes   Fasting Blood Sugar Ranges-  Checks Blood Sugar _____ times a day  GLP1 agonist / usual dose - no GLP1 instructions:  SGLT-2 inhibitors / usual dose - no SGLT-2 instructions:   Blood Thinner / Instructions:no Aspirin Instructions:no  Comments:   Activity level: Patient is able to climb a flight of stairs without difficulty; []  No CP  []  No SOB,   Patient can perform ADLs without assistance.   Anesthesia review: HIV, Anemia, Hodgkin lymphoma 2016, Hgb 9.3.  Patient denies shortness of breath, fever, cough and chest pain at PAT appointment.  Patient verbalized understanding and agreement to the Pre-Surgical Instructions that were given to them at this PAT appointment. Patient was also educated of the need to review these PAT instructions again prior to his/her surgery.I reviewed the appropriate phone numbers to call if they have any and questions or concerns.

## 2023-12-14 NOTE — Patient Instructions (Signed)
 SURGICAL WAITING ROOM VISITATION  Patients having surgery or a procedure may have no more than 2 support people in the waiting area - these visitors may rotate.    Children under the age of 39 must have an adult with them who is not the patient.  Visitors with respiratory illnesses are discouraged from visiting and should remain at home.  If the patient needs to stay at the hospital during part of their recovery, the visitor guidelines for inpatient rooms apply. Pre-op nurse will coordinate an appropriate time for 1 support person to accompany patient in pre-op.  This support person may not rotate.    Please refer to the [facility] website for the visitor guidelines for Inpatients (after your surgery is over and you are in a regular room).       Your procedure is scheduled on: 12/30/23   Report to [facility] Main Entrance    Report to admitting at  6:45 AM   Call this number if you have problems the morning of surgery [phone]   Do not eat food or drink liquids:After Midnight. But may have sips of water to take meds.    FOLLOW BOWEL PREP AND ANY ADDITIONAL PRE OP INSTRUCTIONS YOU RECEIVED FROM YOUR SURGEON'S OFFICE!!!     Oral Hygiene is also important to reduce your risk of infection.                                    Remember - BRUSH YOUR TEETH THE MORNING OF SURGERY WITH YOUR REGULAR TOOTHPASTE   Do NOT smoke after Midnight   Stop all vitamins and herbal supplements 7 days before surgery.   Take these medicines the morning of surgery with A SIP OF WATER: Biktarvy , eye drops             You may not have any metal on your body including hair pins, jewelry, and body piercing             Do not wear make-up, lotions, powders, perfumes/cologne, or deodorant  Do not wear nail polish including gel and S&S, artificial/acrylic nails, or any other type of covering on natural nails including finger and toenails. If you have artificial nails, gel coating, etc. that needs  to be removed by a nail salon please have this removed prior to surgery or surgery may need to be canceled/ delayed if the surgeon/ anesthesia feels like they are unable to be safely monitored.   Do not shave  48 hours prior to surgery.    Do not bring valuables to the hospital. [facility] IS NOT             RESPONSIBLE   FOR VALUABLES.   Contacts, glasses, dentures or bridgework may not be worn into surgery.  DO NOT BRING YOUR HOME MEDICATIONS TO THE HOSPITAL. PHARMACY WILL DISPENSE MEDICATIONS LISTED ON YOUR MEDICATION LIST TO YOU DURING YOUR ADMISSION IN THE HOSPITAL!    Patients discharged on the day of surgery will not be allowed to drive home.  Someone NEEDS to stay with you for the first 24 hours after anesthesia.   Special Instructions: Bring a copy of your healthcare power of attorney and living will documents the day of surgery if you haven't scanned them before.              Please read over the following fact sheets you were given: IF YOU  HAVE QUESTIONS ABOUT YOUR PRE-OP INSTRUCTIONS PLEASE CALL [phone] [place]   If you received a COVID test during your pre-op visit  it is requested that you wear a mask when out in public, stay away from anyone that may not be feeling well and notify your surgeon if you develop symptoms. If you test positive for Covid or have been in contact with anyone that has tested positive in the last 10 days please notify you surgeon.    [facility] - Preparing for Surgery Before surgery, you can play an important role.  Because skin is not sterile, your skin needs to be as free of germs as possible.  You can reduce the number of germs on your skin by washing with CHG (chlorahexidine gluconate) soap before surgery.  CHG is an antiseptic cleaner which kills germs and bonds with the skin to continue killing germs even after washing. Please DO NOT use if you have an allergy to CHG or antibacterial soaps.  If your skin becomes reddened/irritated stop using the  CHG and inform your nurse when you arrive at Short Stay. Do not shave (including legs and underarms) for at least 48 hours prior to the first CHG shower.  You may shave your face/neck.  Please follow these instructions carefully:  1.  Shower with CHG Soap the night before surgery and the  morning of surgery.  2.  If you choose to wash your hair, wash your hair first as usual with your normal  shampoo.  3.  After you shampoo, rinse your hair and body thoroughly to remove the shampoo.                             4.  Use CHG as you would any other liquid soap.  You can apply chg directly to the skin and wash.  Gently with a scrungie or clean washcloth.  5.  Apply the CHG Soap to your body ONLY FROM THE NECK DOWN.   Do   not use on face/ open                           Wound or open sores. Avoid contact with eyes, ears mouth and   genitals (private parts).                       Wash face,  Genitals (private parts) with your normal soap.             6.  Wash thoroughly, paying special attention to the area where your    surgery  will be performed.  7.  Thoroughly rinse your body with warm water from the neck down.  8.  DO NOT shower/wash with your normal soap after using and rinsing off the CHG Soap.                9.  Pat yourself dry with a clean towel.            10.  Wear clean pajamas.            11.  Place clean sheets on your bed the night of your first shower and do not  sleep with pets. Day of Surgery : Do not apply any lotions/deodorants the morning of surgery.  Please wear clean clothes to the hospital/surgery center.  FAILURE TO FOLLOW THESE INSTRUCTIONS MAY RESULT IN THE CANCELLATION OF  YOUR SURGERY  PATIENT SIGNATURE_________________________________  NURSE SIGNATURE__________________________________  ________________________________________________________________________

## 2023-12-15 ENCOUNTER — Ambulatory Visit
Admission: RE | Admit: 2023-12-15 | Discharge: 2023-12-15 | Disposition: A | Discharge status: Home / Self Care | Source: Ambulatory Visit | Attending: Infectious Disease | Admitting: Infectious Disease

## 2023-12-15 ENCOUNTER — Ambulatory Visit (INDEPENDENT_AMBULATORY_CARE_PROVIDER_SITE_OTHER): Payer: Self-pay | Admitting: Infectious Disease

## 2023-12-15 ENCOUNTER — Other Ambulatory Visit (HOSPITAL_COMMUNITY)
Admission: RE | Admit: 2023-12-15 | Discharge: 2023-12-15 | Disposition: A | Discharge status: Home / Self Care | Source: Ambulatory Visit | Attending: Infectious Disease | Admitting: Infectious Disease

## 2023-12-15 ENCOUNTER — Encounter: Payer: Self-pay | Admitting: Infectious Disease

## 2023-12-15 ENCOUNTER — Ambulatory Visit: Payer: 59 | Admitting: Internal Medicine

## 2023-12-15 ENCOUNTER — Other Ambulatory Visit: Payer: Self-pay

## 2023-12-15 VITALS — BP 102/63 | HR 73 | Ht 64.0 in | Wt 111.0 lb

## 2023-12-15 DIAGNOSIS — C8118 Nodular sclerosis classical Hodgkin lymphoma, lymph nodes of multiple sites: Secondary | ICD-10-CM

## 2023-12-15 DIAGNOSIS — Z7185 Encounter for immunization safety counseling: Secondary | ICD-10-CM | POA: Diagnosis not present

## 2023-12-15 DIAGNOSIS — B20 Human immunodeficiency virus [HIV] disease: Secondary | ICD-10-CM | POA: Diagnosis not present

## 2023-12-15 DIAGNOSIS — R058 Other specified cough: Secondary | ICD-10-CM | POA: Diagnosis not present

## 2023-12-15 DIAGNOSIS — R059 Cough, unspecified: Secondary | ICD-10-CM | POA: Diagnosis not present

## 2023-12-15 DIAGNOSIS — Z124 Encounter for screening for malignant neoplasm of cervix: Secondary | ICD-10-CM

## 2023-12-15 DIAGNOSIS — R636 Underweight: Secondary | ICD-10-CM

## 2023-12-15 DIAGNOSIS — D013 Carcinoma in situ of anus and anal canal: Secondary | ICD-10-CM | POA: Insufficient documentation

## 2023-12-15 DIAGNOSIS — F17200 Nicotine dependence, unspecified, uncomplicated: Secondary | ICD-10-CM | POA: Diagnosis not present

## 2023-12-15 DIAGNOSIS — Z113 Encounter for screening for infections with a predominantly sexual mode of transmission: Secondary | ICD-10-CM | POA: Diagnosis not present

## 2023-12-15 LAB — POCT URINE PREGNANCY: Preg Test, Ur: NEGATIVE

## 2023-12-15 LAB — URINE CYTOLOGY ANCILLARY ONLY
Chlamydia: NEGATIVE
Comment: NEGATIVE
Comment: NORMAL
Neisseria Gonorrhea: NEGATIVE

## 2023-12-15 MED ORDER — OMEPRAZOLE 40 MG PO CPDR: 40.0000 mg | DELAYED_RELEASE_CAPSULE | Freq: Every day | ORAL | 6 refills | Status: DC

## 2023-12-15 MED ORDER — BIKTARVY 50-200-25 MG PO TABS
1.0000 | ORAL_TABLET | Freq: Every day | ORAL | 11 refills | Status: AC
Start: 2023-12-15 — End: ?

## 2023-12-16 ENCOUNTER — Other Ambulatory Visit: Payer: Self-pay

## 2023-12-16 ENCOUNTER — Other Ambulatory Visit (HOSPITAL_COMMUNITY)
Admission: RE | Admit: 2023-12-16 | Discharge: 2023-12-16 | Disposition: A | Discharge status: Home / Self Care | Source: Ambulatory Visit | Attending: Infectious Diseases | Admitting: Infectious Diseases

## 2023-12-16 ENCOUNTER — Encounter: Payer: Self-pay | Admitting: Infectious Diseases

## 2023-12-16 ENCOUNTER — Ambulatory Visit (INDEPENDENT_AMBULATORY_CARE_PROVIDER_SITE_OTHER): Admitting: Infectious Diseases

## 2023-12-16 VITALS — BP 98/63 | HR 90 | Temp 98.2°F | Wt 110.0 lb

## 2023-12-16 DIAGNOSIS — Z113 Encounter for screening for infections with a predominantly sexual mode of transmission: Secondary | ICD-10-CM | POA: Diagnosis not present

## 2023-12-16 DIAGNOSIS — Z01419 Encounter for gynecological examination (general) (routine) without abnormal findings: Secondary | ICD-10-CM | POA: Diagnosis present

## 2023-12-16 DIAGNOSIS — Z1151 Encounter for screening for human papillomavirus (HPV): Secondary | ICD-10-CM | POA: Insufficient documentation

## 2023-12-16 LAB — T-HELPER CELLS (CD4) COUNT (NOT AT ARMC)
CD4 % Helper T Cell: 33 % (ref 33–65)
CD4 T Cell Abs: 207 /uL — ABNORMAL LOW (ref 400–1790)
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
## 2023-12-16 NOTE — Patient Instructions (Addendum)
 You can start to track days between periods and guess (predict) the release of an egg will happen. You can use cycle patterns to decide when to have sex to increase chances for getting pregnant.  Many people have a period every 28-30 days, but cycles vary. Cycles can range between 23-35 days. A short or long cycle will affect the period of time that you can get pregnant. Ovulation happens 12-14 days before the start of the next period. The days you're most likely to get pregnant (fertile days) will vary based on how many days are between periods. A person with a 28-day menstrual cycle has about 6 days a month when they're the most fertile. An egg is fertile for 24 hours after it leaves the ovary. Sperm can live for 3 days or more.  Period Tracker or Ovulation Tracker APPS on your phone may be helpful as well   For vaginal health -  Can do nightly vaginal suppositories when you are NOT PREGNANT for 3-5 days after your period.     Smoking Cessation: QuitlineNC 1-800-QUIT-NOW 912-738-0583); Espaol: 1-855-Djelo-Ya (1-951 826 2062) http://carroll-castaneda.info/

## 2023-12-17 ENCOUNTER — Ambulatory Visit: Payer: Self-pay | Admitting: Infectious Diseases

## 2023-12-17 ENCOUNTER — Encounter (HOSPITAL_COMMUNITY): Payer: Self-pay

## 2023-12-17 ENCOUNTER — Other Ambulatory Visit: Payer: Self-pay

## 2023-12-17 ENCOUNTER — Encounter (HOSPITAL_COMMUNITY)
Admission: RE | Admit: 2023-12-17 | Discharge: 2023-12-17 | Disposition: A | Discharge status: Home / Self Care | Source: Ambulatory Visit | Attending: General Surgery | Admitting: General Surgery

## 2023-12-17 VITALS — BP 94/62 | HR 78 | Temp 98.1°F | Resp 16 | Ht 64.0 in | Wt 110.0 lb

## 2023-12-17 DIAGNOSIS — Z01812 Encounter for preprocedural laboratory examination: Secondary | ICD-10-CM | POA: Insufficient documentation

## 2023-12-17 DIAGNOSIS — Z01818 Encounter for other preprocedural examination: Secondary | ICD-10-CM | POA: Diagnosis present

## 2023-12-17 LAB — CERVICOVAGINAL ANCILLARY ONLY
Bacterial Vaginitis (gardnerella): POSITIVE — AB
Chlamydia: NEGATIVE
Comment: NEGATIVE
Comment: NEGATIVE
Comment: NEGATIVE
Comment: NORMAL
Neisseria Gonorrhea: NEGATIVE
Trichomonas: POSITIVE — AB

## 2023-12-17 LAB — HIV-1 RNA QUANT-NO REFLEX-BLD
HIV 1 RNA Quant: <20 {copies}/mL — AB
HIV-1 RNA Quant, Log: <1.3 {Log_copies}/mL — AB

## 2023-12-17 LAB — CBC
HCT: 30.8 % — ABNORMAL LOW (ref 36.0–46.0)
Hemoglobin: 9.3 g/dL — ABNORMAL LOW (ref 12.0–15.0)
MCH: 29.8 pg (ref 26.0–34.0)
MCHC: 30.2 g/dL (ref 30.0–36.0)
MCV: 98.7 fL (ref 80.0–100.0)
Platelets: 492 K/uL — ABNORMAL HIGH (ref 150–400)
RBC: 3.12 MIL/uL — ABNORMAL LOW (ref 3.87–5.11)
RDW: 17.1 % — ABNORMAL HIGH (ref 11.5–15.5)
WBC: 7.7 K/uL (ref 4.0–10.5)
nRBC: 0 % (ref 0.0–0.2)

## 2023-12-17 LAB — LIPID PANEL
Cholesterol: 124 mg/dL (ref <200)
HDL: 37 mg/dL — ABNORMAL LOW (ref >=50)
LDL Cholesterol (Calc): 68 mg/dL
Non-HDL Cholesterol (Calc): 87 mg/dL (ref <130)
Total CHOL/HDL Ratio: 3.4 (calc) (ref <5.0)
Triglycerides: 111 mg/dL (ref <150)

## 2023-12-17 LAB — CYTOLOGY - PAP
Adequacy: ABSENT
Comment: NEGATIVE
Diagnosis: NEGATIVE
High risk HPV: NEGATIVE

## 2023-12-17 LAB — RPR: RPR Ser Ql: NONREACTIVE

## 2023-12-17 MED ORDER — METRONIDAZOLE 500 MG PO TABS
500.0000 mg | ORAL_TABLET | Freq: Two times a day (BID) | ORAL | 0 refills | Status: AC
Start: 2023-12-17 — End: 2023-12-24

## 2023-12-17 NOTE — Progress Notes (Signed)
 Request sent to Dr. RONAL Ned to review pt's pre op CBC drawn on 12/17/23.

## 2023-12-23 ENCOUNTER — Ambulatory Visit: Admitting: Obstetrics and Gynecology

## 2023-12-24 ENCOUNTER — Other Ambulatory Visit: Payer: Self-pay

## 2023-12-24 ENCOUNTER — Other Ambulatory Visit: Payer: Self-pay | Admitting: Hematology and Oncology

## 2023-12-24 MED ORDER — TRAMADOL HCL 50 MG PO TABS: 100.0000 mg | ORAL_TABLET | Freq: Four times a day (QID) | ORAL | 0 refills | Status: DC | PRN

## 2023-12-26 ENCOUNTER — Other Ambulatory Visit: Payer: Self-pay

## 2023-12-26 ENCOUNTER — Emergency Department (HOSPITAL_COMMUNITY)
Admission: EM | Admit: 2023-12-26 | Discharge: 2023-12-26 | Disposition: A | Discharge status: Home / Self Care | Attending: Emergency Medicine | Admitting: Emergency Medicine

## 2023-12-26 DIAGNOSIS — Z21 Asymptomatic human immunodeficiency virus [HIV] infection status: Secondary | ICD-10-CM | POA: Insufficient documentation

## 2023-12-26 DIAGNOSIS — K529 Noninfective gastroenteritis and colitis, unspecified: Secondary | ICD-10-CM | POA: Insufficient documentation

## 2023-12-26 DIAGNOSIS — R509 Fever, unspecified: Secondary | ICD-10-CM | POA: Diagnosis present

## 2023-12-26 LAB — RESP PANEL BY RT-PCR (RSV, FLU A&B, COVID)  RVPGX2
Influenza A by PCR: NEGATIVE
Influenza B by PCR: NEGATIVE
Resp Syncytial Virus by PCR: NEGATIVE
SARS Coronavirus 2 by RT PCR: NEGATIVE

## 2023-12-26 NOTE — ED Provider Notes (Signed)
 [place] EMERGENCY DEPARTMENT AT [facility] Provider Note   CSN: [id number] Arrival date & time: 12/26/23  1427     Patient presents with: URI   [name] is a 38 y.o. female with PMHx HIV, anemia, headaches, who presents to ED concerned for fever, chills, and diarrhea since yesterday. Patient endorsing fever of 100.54F at home. Denies nausea, vomiting, rhinorrhea, congestion, sore throat. Endorses an intermittent and mild cough. Patient thinks that someone at the nail salon might have made her sick. Last dose of Tylenol  was 7AM this morning.  Patient recently finishing flagyl  course yesterday for BV. Denies suspicious food intake or unsafe water drinking. Denies recent travel. Denies hematochezia. Denies hx of crohns or IBS.    URI      Prior to Admission medications   Medication Sig Start Date End Date Taking? Authorizing Provider  bictegravir-emtricitabine -tenofovir  AF (BIKTARVY ) 50-200-25 MG TABS tablet Take 1 tablet by mouth daily. 12/15/23   Fleeta Kathie Jomarie LOISE, MD  Brimonidine Tartrate (LUMIFY) 0.025 % SOLN Place 1-2 drops into both eyes 2 (two) times daily as needed (irritated eyes.).    [provider]  Fluocinolone Acetonide 0.01 % OIL Apply 1 Application topically once a week.    [provider]  mirtazapine  (REMERON ) 15 MG tablet Take 1 tablet (15 mg total) by mouth at bedtime. 11/11/23   Lonn Hicks, MD  Multiple Vitamin (MULTIVITAMIN WITH MINERALS) TABS tablet Take 1 tablet by mouth in the morning and at bedtime.    [provider]  omeprazole  (PRILOSEC) 40 MG capsule Take 1 capsule (40 mg total) by mouth daily. 12/15/23   Fleeta Kathie Jomarie LOISE, MD  traMADol  (ULTRAM ) 50 MG tablet Take 2 tablets (100 mg total) by mouth every 6 (six) hours as needed for up to 11 days for severe pain (pain score 7-10). 12/24/23 01/04/24  Lonn Hicks, MD    Allergies: Temazepam     Review of Systems  Gastrointestinal:  Positive for diarrhea.     Updated Vital Signs BP 103/62 (BP Location: Right Arm)   Pulse 85   Temp 99.4 F (37.4 C) (Oral)   Resp 16   Ht 5' 4 (1.626 m)   Wt 49.9 kg   LMP 12/26/2023 (Approximate)   SpO2 100%   BMI 18.88 kg/m   Physical Exam Vitals and nursing note reviewed.  Constitutional:      General: She is not in acute distress.    Appearance: She is not ill-appearing or toxic-appearing.  HENT:     Head: Normocephalic and atraumatic.     Mouth/Throat:     Mouth: Mucous membranes are moist.  Eyes:     General: No scleral icterus.       Right eye: No discharge.        Left eye: No discharge.     Conjunctiva/sclera: Conjunctivae normal.  Cardiovascular:     Rate and Rhythm: Normal rate and regular rhythm.     Pulses: Normal pulses.     Heart sounds: Normal heart sounds. No murmur heard. Pulmonary:     Effort: Pulmonary effort is normal. No respiratory distress.     Breath sounds: Normal breath sounds. No wheezing, rhonchi or rales.  Abdominal:     General: Abdomen is flat. Bowel sounds are normal. There is no distension.     Palpations: Abdomen is soft. There is no mass.     Tenderness: There is no abdominal tenderness.  Musculoskeletal:     Right lower leg:  No edema.     Left lower leg: No edema.  Skin:    General: Skin is warm and dry.     Findings: No rash.  Neurological:     General: No focal deficit present.     Mental Status: She is alert and oriented to person, place, and time. Mental status is at baseline.  Psychiatric:        Mood and Affect: Mood normal.        Behavior: Behavior normal.     (all labs ordered are listed, but only abnormal results are displayed) Labs Reviewed  RESP PANEL BY RT-PCR (RSV, FLU A&B, COVID)  RVPGX2  C DIFFICILE QUICK SCREEN W PCR REFLEX    GASTROINTESTINAL PANEL BY PCR, STOOL (REPLACES STOOL CULTURE)    EKG: None  Radiology: No results found.   Procedures   Medications Ordered in the ED - No data to display  Clinical Course  as of 12/26/23 1839  Sun Dec 26, 2023  1644 Personally evaluated patient, she reports that she has had fever, chills, diarrhea yesterday with fever at home.  Denies nausea, vomiting, upper respiratory symptoms.  Does report that she recently took a course of metronidazole  for bacterial vaginosis.  Patient hemodynamically stable, well-appearing. [LS]    Clinical Course User Index [LS] Rogelia Jerilynn RAMAN, MD                                 Medical Decision Making  This patient presents to the ED for concern of diarrhea, this involves an extensive number of treatment options, and is a complaint that carries with it a high risk of complications and morbidity.  The differential diagnosis includes food poisoning, gastroenteritis, C.Diff, STEC, other bacterial cause, protozoa, viral infection, IBS, Crohn's disease.    Co morbidities that complicate the patient evaluation  HIV, anemia, headaches   Additional history obtained:  No PCP listed in chart    Problem List / ED Course / Critical interventions / Medication management  Patient presents to ED concerned for diarrhea x1 day. Also with fever and recent ABX use. Also with mild and intermittent cough. Physical exam reassuring. Patient afebrile with stable vitals. I Ordered, and personally interpreted labs.  Resp panel pending. Offered stool sample for patient given fever, diarrhea, and recent ABX use. Patient was not able to provide stool sample while in ED - which is reassuring that patient does not have c.diff given lack of BM during the 4 hours that she was here. Patient requesting to go home. Patient wants to follow up with resp panel at home. I feel that this is appropriate as patient appears healthy and NAD.  Recommended following up with PCP. Patient verbalized understanding of plan. I have reviewed the patients home medicines and have made adjustments as needed The patient has been appropriately medically screened and/or stabilized  in the ED. I have low suspicion for any other emergent medical condition which would require further screening, evaluation or treatment in the ED or require inpatient management. At time of discharge the patient is hemodynamically stable and in no acute distress. I have discussed work-up results and diagnosis with patient and answered all questions. Patient is agreeable with discharge plan. We discussed strict return precautions for returning to the emergency department and they verbalized understanding.     Social Determinants of Health:  none      Final diagnoses:  Gastroenteritis  ED Discharge Orders     None          Hoy Nidia FALCON, [place] 12/26/23 1839    Rogelia Jerilynn RAMAN, MD 12/29/23 1525

## 2023-12-26 NOTE — ED Notes (Signed)
 Pt attempted to produce a stool sample. Pt urinated in the stool sample. Sample contaminated. Pt made aware and tried again. Unable to produce another stool sample at this time. Pt will try again later.

## 2023-12-26 NOTE — ED Triage Notes (Signed)
 Patient to ED by POV with c/o fever, chills and diarrhea since yesterday. Reports highest temp at home was 107. Denies N.V/ or pain

## 2023-12-26 NOTE — ED Notes (Signed)
 Nurse Assessment: Complains of fever and chills

## 2023-12-26 NOTE — Discharge Instructions (Signed)
 As discussed, you will need to follow-up with your primary care provider.  Seek emergency care if experiencing any new or worsening symptoms.  Alternating between 650 mg Tylenol  and 400 mg Advil : The best way to alternate taking Acetaminophen  (example Tylenol ) and Ibuprofen  (example Advil /Motrin ) is to take them 3 hours apart. For example, if you take ibuprofen  at 6 am you can then take Tylenol  at 9 am. You can continue this regimen throughout the day, making sure you do not exceed the recommended maximum dose for each drug.

## 2023-12-28 ENCOUNTER — Other Ambulatory Visit: Payer: Self-pay | Admitting: Internal Medicine

## 2023-12-28 DIAGNOSIS — B20 Human immunodeficiency virus [HIV] disease: Secondary | ICD-10-CM

## 2023-12-28 NOTE — Progress Notes (Addendum)
 Case: [id number] Date/Time: 12/30/23 0845   Procedure: EXCISION, NEOPLASM, INTRAEPITHELIAL, ANUS - excision of anal polyp   Anesthesia type: Monitor Anesthesia Care   Pre-op diagnosis: anal polyp   Location: [facility] ROOM 01 / [facility]   Surgeons: Debby Hila, MD       DISCUSSION: [name] is a 38 yo female with PMH of current smoking, COPD (by CT), HIV, Hodgkin's lymphoma, GERD, anemia, depression, chronic pain.  Patient follows with ID for HIV. Currently takes Biktarvy . Has undetectable viral load. CD4 count 207  Patient follows with Oncology for hx of Hodgkins lymphoma (currently in remission) and anemia. Last seen by Dr. Lonn on 11/11/23. She reportedly has been having mild intermittent rectal bleeding and anemia has slowly been trending down. She was advised to take Iron supplements. Hgb 9.3 on PAT labs.   Patient had interval ED visit for fever and diarrhea on 8/31. C.diff testing offered but pt was unable to produce a sample. Covid testing was negative. She was advised to f/u with PCP.  Message relayed to [facility] regarding recent ED visit with acute GI illness  VS: BP 94/62   Pulse 78   Temp 36.7 C (Oral)   Resp 16   Ht 5' 4 (1.626 m)   Wt 49.9 kg   LMP 11/22/2023 (Approximate)   BMI 18.88 kg/m   PROVIDERS: Pcp, No   LABS: Labs reviewed: Repeat CBC DOS (all labs ordered are listed, but only abnormal results are displayed)  Labs Reviewed  CBC - Abnormal; Notable for the following components:      Result Value   RBC 3.12 (*)    Hemoglobin 9.3 (*)    HCT 30.8 (*)    RDW 17.1 (*)    Platelets 492 (*)    All other components within normal limits     IMAGES:  CXR 12/15/23:  FINDINGS: The heart size and mediastinal contours are within normal limits. Both lungs are clear. The visualized skeletal structures are unremarkable.   IMPRESSION: Normal exam.  EKG:   CV:  Echo 06/15/2014:  Study Conclusions  - Left ventricle: The cavity size was  normal. Systolic function was   normal. The estimated ejection fraction was in the range of 55%   to 60%. Wall motion was normal; there were no regional wall   motion abnormalities. Left ventricular diastolic function   parameters were normal. Past Medical History:  Diagnosis Date   AIN III (anal intraepithelial neoplasia III)    Anemia    Cancer (HCC)    Hodgkin lymphoma   Chest wall pain 06/27/2015   Condyloma acuminatum in female    Depression    History of chronic bronchitis    History of esophagitis    CANDIDA   History of shingles    HIV (human immunodeficiency virus infection) (HCC)    Hodgkin's lymphoma (HCC) 06/12/2014   HSV (herpes simplex virus) infection    Hypokalemia 07/17/2014   Periodontitis, chronic    Screening examination for venereal disease 10/30/2013    Past Surgical History:  Procedure Laterality Date   DIAGNOSTIC LAPAROSCOPY WITH REMOVAL OF ECTOPIC PREGNANCY N/A 11/17/2015   Procedure: LAPAROSCOPY LEFT  SALPINGECTOMY SECONDARY TO LEFT ECTOPIC PREGNANCY;  Surgeon: Norleen LULLA Server, MD;  Location: [facility];  Service: Gynecology;  Laterality: N/A;   DILATION AND CURETTAGE OF UTERUS  2005   MISSED AB   EXAMINATION UNDER ANESTHESIA N/A 09/23/2012   Procedure: EXAM UNDER ANESTHESIA;  Surgeon: Elspeth KYM Schultze, MD;  Location: [facility];  Service: General;  Laterality: N/A;   IR GENERIC HISTORICAL  05/26/2016   IR FLUORO GUIDE PORT INSERTION RIGHT 05/26/2016 [facility]   IR GENERIC HISTORICAL  05/26/2016   IR US  GUIDE VASC ACCESS RIGHT 05/26/2016 [facility]   IR REMOVAL TUN ACCESS W/ PORT W/O FL MOD SED  09/19/2019   LASER ABLATION CONDOLAMATA N/A 09/23/2012   Procedure: REMOVAL/ABLATION  ABLATION CONDOLAMATA WARTS;  Surgeon: Elspeth KYM Schultze, MD;  Location: [facility];  Service: General;  Laterality: N/A;    MEDICATIONS:  bictegravir-emtricitabine -tenofovir  AF (BIKTARVY ) 50-200-25 MG TABS tablet   Brimonidine Tartrate (LUMIFY) 0.025 %  SOLN   Fluocinolone Acetonide 0.01 % OIL   mirtazapine  (REMERON ) 15 MG tablet   Multiple Vitamin (MULTIVITAMIN WITH MINERALS) TABS tablet   omeprazole  (PRILOSEC) 40 MG capsule   traMADol  (ULTRAM ) 50 MG tablet   No current facility-administered medications for this encounter.   Burnard CHRISTELLA Odis DEVONNA [facility] Short Stay/Anesthesiology [facility] Phone [phone] 12/28/2023 3:34 PM

## 2023-12-30 ENCOUNTER — Encounter (HOSPITAL_COMMUNITY): Payer: Self-pay | Admitting: Medical

## 2023-12-30 ENCOUNTER — Encounter (HOSPITAL_COMMUNITY): Admission: RE | Payer: Self-pay | Source: Ambulatory Visit

## 2023-12-30 ENCOUNTER — Ambulatory Visit (HOSPITAL_COMMUNITY): Admission: RE | Admit: 2023-12-30 | Payer: Self-pay | Source: Ambulatory Visit | Admitting: General Surgery

## 2023-12-30 DIAGNOSIS — D539 Nutritional anemia, unspecified: Secondary | ICD-10-CM

## 2023-12-30 SURGERY — EXCISION, NEOPLASM, INTRAEPITHELIAL, ANUS
Anesthesia: Monitor Anesthesia Care

## 2023-12-31 ENCOUNTER — Other Ambulatory Visit: Payer: Self-pay | Admitting: Hematology and Oncology

## 2023-12-31 ENCOUNTER — Other Ambulatory Visit: Payer: Self-pay

## 2023-12-31 MED ORDER — TRAMADOL HCL 50 MG PO TABS: 100.0000 mg | ORAL_TABLET | Freq: Four times a day (QID) | ORAL | 0 refills | Status: DC | PRN

## 2024-01-12 ENCOUNTER — Other Ambulatory Visit: Payer: Self-pay | Admitting: Hematology and Oncology

## 2024-01-12 ENCOUNTER — Other Ambulatory Visit: Payer: Self-pay

## 2024-01-12 ENCOUNTER — Encounter (HOSPITAL_COMMUNITY): Payer: Self-pay | Admitting: General Surgery

## 2024-01-12 NOTE — Progress Notes (Addendum)
 For Anesthesia: PCP - [facility] Cardiologist - N/A  Bowel Prep reminder: N/A  Chest x-ray - 12/17/23 in [facility] EKG - greater than 1 year Stress Test - N/A ECHO - greater than 2 years  Cardiac Cath - N/A Pacemaker/ICD device last checked: N/A Pacemaker orders received: N/A Device Rep notified: N/A  Spinal Cord Stimulator: N/A  Sleep Study - N/A CPAP -   Fasting Blood Sugar - N/A Checks Blood Sugar __N/A___ times a day Date and result of last Hgb A1c-N/A  Last dose of GLP1 agonist- N/A GLP1 instructions: Hold 7 days prior to schedule (Hold 24 hours-daily)   Last dose of SGLT-2 inhibitors- N/A SGLT-2 instructions: Hold 72 hours prior to surgery  Blood Thinner Instructions:N/A Last Dose:N/A Time last taken:N/A  Aspirin Instructions:N/A Last Dose:N/A Time last taken:N/A  Activity level:    Able to exercise without chest pain and/or shortness of breath       Anesthesia review: N/A  Patient denies shortness of breath, fever, cough and chest pain at PAT appointment   Patient verbalized understanding of instructions that were reviewed over the telephone.

## 2024-01-12 NOTE — Progress Notes (Signed)
 Attempted to obtain medical history via telephone, unable to reach at this time. HIPAA compliant voicemail message left requesting return call to pre surgical testing department.

## 2024-01-13 ENCOUNTER — Other Ambulatory Visit: Payer: Self-pay | Admitting: Hematology and Oncology

## 2024-01-13 MED ORDER — TRAMADOL HCL 50 MG PO TABS: 100.0000 mg | ORAL_TABLET | Freq: Four times a day (QID) | ORAL | 0 refills | Status: DC | PRN

## 2024-01-18 ENCOUNTER — Encounter (HOSPITAL_COMMUNITY)

## 2024-01-20 ENCOUNTER — Ambulatory Visit (HOSPITAL_COMMUNITY): Admission: RE | Admit: 2024-01-20 | Source: Home / Self Care | Admitting: General Surgery

## 2024-01-20 DIAGNOSIS — D539 Nutritional anemia, unspecified: Secondary | ICD-10-CM

## 2024-01-20 SURGERY — EXCISION, NEOPLASM, INTRAEPITHELIAL, ANUS
Anesthesia: Monitor Anesthesia Care

## 2024-01-24 ENCOUNTER — Other Ambulatory Visit: Payer: Self-pay | Admitting: Hematology and Oncology

## 2024-01-24 ENCOUNTER — Telehealth: Payer: Self-pay

## 2024-01-24 MED ORDER — TRAMADOL HCL 50 MG PO TABS: 100.0000 mg | ORAL_TABLET | Freq: Four times a day (QID) | ORAL | 0 refills | Status: DC | PRN

## 2024-01-24 NOTE — Telephone Encounter (Signed)
 Called and left a message Rx sent.

## 2024-01-24 NOTE — Telephone Encounter (Signed)
 She called and left a message requesting tramadol  refill to [facility].

## 2024-02-11 ENCOUNTER — Other Ambulatory Visit: Payer: Self-pay | Admitting: Hematology and Oncology

## 2024-02-11 ENCOUNTER — Telehealth: Payer: Self-pay

## 2024-02-11 MED ORDER — TRAMADOL HCL 50 MG PO TABS: 100.0000 mg | ORAL_TABLET | Freq: Four times a day (QID) | ORAL | 0 refills | Status: DC | PRN

## 2024-02-11 NOTE — Telephone Encounter (Signed)
Called and left a message Rx sent. Ask her to call the office for questions. 

## 2024-02-11 NOTE — Telephone Encounter (Signed)
 She called and left a message requesting Tramadol  refill to [facility] on [address] please.

## 2024-02-21 ENCOUNTER — Telehealth: Payer: Self-pay

## 2024-02-21 NOTE — Telephone Encounter (Signed)
 She called and left a message. She is asking if you can change the mirtazapine  prescription to something else? She is complaining of night sweats and bad dreams.

## 2024-02-21 NOTE — Telephone Encounter (Signed)
 I gave her megace  in the past but she didn't want it because it caused her to miss her periods I have nothing else to offer

## 2024-02-21 NOTE — Telephone Encounter (Signed)
 Called her and given below message. She verbalized understanding and will just keep taking the mirtazapine . She will call the office back for questions.

## 2024-03-02 ENCOUNTER — Telehealth: Payer: Self-pay

## 2024-03-02 ENCOUNTER — Other Ambulatory Visit: Payer: Self-pay | Admitting: Hematology and Oncology

## 2024-03-02 MED ORDER — TRAMADOL HCL 50 MG PO TABS: 100.0000 mg | ORAL_TABLET | Freq: Four times a day (QID) | ORAL | 0 refills | Status: DC | PRN

## 2024-03-02 NOTE — Telephone Encounter (Signed)
 Called and given below message. She verbalized understanding.

## 2024-03-02 NOTE — Telephone Encounter (Signed)
 She called and left a message requesting Tramadol  refill to [facility] [address].

## 2024-03-13 ENCOUNTER — Telehealth: Payer: Self-pay

## 2024-03-13 ENCOUNTER — Inpatient Hospital Stay: Admitting: Hematology and Oncology

## 2024-03-13 ENCOUNTER — Inpatient Hospital Stay: Attending: Hematology and Oncology

## 2024-03-13 ENCOUNTER — Encounter: Payer: Self-pay | Admitting: Hematology and Oncology

## 2024-03-13 VITALS — BP 124/67 | HR 93 | Temp 100.0°F | Resp 18 | Ht 64.0 in | Wt 112.0 lb

## 2024-03-13 DIAGNOSIS — C8118 Nodular sclerosis classical Hodgkin lymphoma, lymph nodes of multiple sites: Secondary | ICD-10-CM | POA: Diagnosis not present

## 2024-03-13 DIAGNOSIS — D509 Iron deficiency anemia, unspecified: Secondary | ICD-10-CM | POA: Insufficient documentation

## 2024-03-13 DIAGNOSIS — K625 Hemorrhage of anus and rectum: Secondary | ICD-10-CM

## 2024-03-13 DIAGNOSIS — Z91199 Patient's noncompliance with other medical treatment and regimen due to unspecified reason: Secondary | ICD-10-CM | POA: Insufficient documentation

## 2024-03-13 DIAGNOSIS — Z21 Asymptomatic human immunodeficiency virus [HIV] infection status: Secondary | ICD-10-CM | POA: Insufficient documentation

## 2024-03-13 DIAGNOSIS — C811A Nodular sclerosis Hodgkin lymphoma, in remission: Secondary | ICD-10-CM | POA: Insufficient documentation

## 2024-03-13 DIAGNOSIS — D539 Nutritional anemia, unspecified: Secondary | ICD-10-CM | POA: Diagnosis not present

## 2024-03-13 DIAGNOSIS — R634 Abnormal weight loss: Secondary | ICD-10-CM

## 2024-03-13 LAB — CMP (CANCER CENTER ONLY)
ALT: 19 U/L (ref 0–44)
AST: 16 U/L (ref 15–41)
Albumin: 3.3 g/dL — ABNORMAL LOW (ref 3.5–5.0)
Alkaline Phosphatase: 112 U/L (ref 38–126)
Anion gap: 7 (ref 5–15)
BUN: 10 mg/dL (ref 6–20)
CO2: 25 mmol/L (ref 22–32)
Calcium: 8.9 mg/dL (ref 8.9–10.3)
Chloride: 104 mmol/L (ref 98–111)
Creatinine: 0.87 mg/dL (ref 0.44–1.00)
GFR, Estimated: >60 mL/min (ref >60)
Glucose, Bld: 104 mg/dL — ABNORMAL HIGH (ref 70–99)
Potassium: 3.7 mmol/L (ref 3.5–5.1)
Sodium: 136 mmol/L (ref 135–145)
Total Bilirubin: 0.5 mg/dL (ref 0.0–1.2)
Total Protein: 7.9 g/dL (ref 6.5–8.1)

## 2024-03-13 LAB — CBC WITH DIFFERENTIAL (CANCER CENTER ONLY)
Abs Immature Granulocytes: 0.03 K/uL (ref 0.00–0.07)
Basophils Absolute: 0 K/uL (ref 0.0–0.1)
Basophils Relative: 0 %
Eosinophils Absolute: 0.2 K/uL (ref 0.0–0.5)
Eosinophils Relative: 2 %
HCT: 21.1 % — ABNORMAL LOW (ref 36.0–46.0)
Hemoglobin: 7 g/dL — ABNORMAL LOW (ref 12.0–15.0)
Immature Granulocytes: 0 %
Lymphocytes Relative: 10 %
Lymphs Abs: 0.9 K/uL (ref 0.7–4.0)
MCH: 34.5 pg — ABNORMAL HIGH (ref 26.0–34.0)
MCHC: 33.2 g/dL (ref 30.0–36.0)
MCV: 103.9 fL — ABNORMAL HIGH (ref 80.0–100.0)
Monocytes Absolute: 0.9 K/uL (ref 0.1–1.0)
Monocytes Relative: 10 %
Neutro Abs: 6.9 K/uL (ref 1.7–7.7)
Neutrophils Relative %: 78 %
Platelet Count: 527 K/uL — ABNORMAL HIGH (ref 150–400)
RBC: 2.03 MIL/uL — ABNORMAL LOW (ref 3.87–5.11)
RDW: 20.6 % — ABNORMAL HIGH (ref 11.5–15.5)
WBC Count: 8.9 K/uL (ref 4.0–10.5)
nRBC: 0 % (ref 0.0–0.2)

## 2024-03-13 LAB — IRON AND IRON BINDING CAPACITY (CC-WL,HP ONLY)
Iron: 19 ug/dL — ABNORMAL LOW (ref 28–170)
Saturation Ratios: 9 % — ABNORMAL LOW (ref 10.4–31.8)
TIBC: 203 ug/dL — ABNORMAL LOW (ref 250–450)
UIBC: 184 ug/dL (ref 148–442)

## 2024-03-13 LAB — FERRITIN: Ferritin: 820 ng/mL — ABNORMAL HIGH (ref 11–307)

## 2024-03-13 LAB — SEDIMENTATION RATE: Sed Rate: 50 mm/h — ABNORMAL HIGH (ref 0–22)

## 2024-03-13 NOTE — Assessment & Plan Note (Addendum)
 She has progressive iron deficiency anemia This is likely due to rectal bleeding and poor dietary oral iron intake She is somewhat symptomatic The most likely cause of her anemia is due to chronic blood loss/malabsorption syndrome. We discussed some of the risks, benefits, and alternatives of intravenous iron infusions. The patient is symptomatic from anemia and the iron level is critically low. She tolerated oral iron supplement poorly and desires to achieved higher levels of iron faster for adequate hematopoesis. Some of the side-effects to be expected including risks of infusion reactions, phlebitis, headaches, nausea and fatigue.  The patient is willing to proceed. Patient education material was dispensed.  Goal is to keep ferritin level greater than 50 and resolution of anemia I recommend 2 doses of intravenous iron Feraheme I will see her again in a month for further follow-up with repeat labs

## 2024-03-13 NOTE — Telephone Encounter (Signed)
 Dr. Lonn, patient will be scheduled as soon as possible.  Auth Submission: NO AUTH NEEDED Site of care: Site of care: [facility] Payer: [place] and [employer] medicare Medication & CPT/J Code(s) submitted: Feraheme (ferumoxytol) [id number] Diagnosis Code:  Route of submission (phone, fax, portal): portal Phone # Fax # Auth type: Buy/Bill PB Units/visits requested: 510mg  x 2 doses Reference number: [id number] Approval from: 03/13/24 to 04/26/24

## 2024-03-13 NOTE — Assessment & Plan Note (Addendum)
 The patient was diagnosed with Hodgkin lymphoma in 2016 in the setting of HIV disease Treatment course was complicated by severe anemia, noncompliance with appointment etc. 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, CD30 positive She has complete response with no signs of recurrent disease since 2019  She is being follow-up Clinically in remission with no signs or symptoms to suggest recurrent disease

## 2024-03-13 NOTE — Progress Notes (Signed)
 [facility] OFFICE PROGRESS NOTE  Patient Care Team: Pcp, No as PCP - General Eveline Lynwood MATSU, MD as Consulting Physician (Obstetrics and Gynecology) Darlean Ozell NOVAK, MD as Consulting Physician (Pulmonary Disease) Kerrin Elspeth BROCKS, MD as Consulting Physician (Cardiothoracic Surgery) Comer, Lamar ORN, MD (Inactive) as Consulting Physician (Infectious Diseases)  Assessment & Plan Rectal bleeding I recommend the patient to contact general surgery to reschedule her procedure Deficiency anemia She has progressive iron deficiency anemia This is likely due to rectal bleeding and poor dietary oral iron intake She is somewhat symptomatic The most likely cause of her anemia is due to chronic blood loss/malabsorption syndrome. We discussed some of the risks, benefits, and alternatives of intravenous iron infusions. The patient is symptomatic from anemia and the iron level is critically low. She tolerated oral iron supplement poorly and desires to achieved higher levels of iron faster for adequate hematopoesis. Some of the side-effects to be expected including risks of infusion reactions, phlebitis, headaches, nausea and fatigue.  The patient is willing to proceed. Patient education material was dispensed.  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, CD30 positive She has complete response with no signs of recurrent disease since 2019  She is being follow-up Clinically in remission with no signs or symptoms to suggest recurrent disease   Orders Placed This Encounter  Procedures   CBC with Differential (Cancer Center Only)    Standing Status:   Future    Expiration Date:   03/13/2025   Iron and Iron Binding Capacity ([facility] only)    Standing Status:   Future    Expiration Date:   03/13/2025   Ferritin    Standing Status:   Future    Expiration Date:   03/13/2025   Vitamin B12    Standing Status:   Future    Expiration Date:   03/13/2025     Almarie Bedford, MD  INTERVAL HISTORY: she returns for surveillance follow-up for history of Hodgkin lymphoma She complained of excessive fatigue She has been having intermittent chronic rectal bleeding but her surgery has been canceled several times Her dietary intake of iron is poor, in general she only eat 1-2 meals and typically low in protein and source of iron The patient denies any recent signs or symptoms of bleeding such as spontaneous epistaxis, hematuria or hematemesis. She does not take aspirin or NSAID   PHYSICAL EXAMINATION: ECOG PERFORMANCE STATUS: 1 - Symptomatic but completely ambulatory  Vitals:   03/13/24 1020  BP: 124/67  Pulse: 93  Resp: 18  Temp: 100 F (37.8 C)  SpO2: 100%   Filed Weights   03/13/24 1020  Weight: 112 lb (50.8 kg)   GENERAL:alert, no distress and comfortable SKIN: skin color, texture, turgor are normal, no rashes or significant lesions EYES: normal, conjunctiva are pink and non-injected, sclera clear OROPHARYNX:no exudate, no erythema  and lips, buccal mucosa, and tongue normal  NECK: supple, thyroid  normal size, non-tender, without nodularity LYMPH:  no palpable lymphadenopathy in the cervical, axillary or inguinal LUNGS: clear to auscultation and percussion with  normal breathing effort HEART: regular rate & rhythm and no murmurs and no lower extremity edema ABDOMEN:abdomen soft, non-tender and normal bowel sounds Musculoskeletal:no cyanosis of digits and no clubbing  PSYCH: alert & oriented x 3 with fluent speech NEURO: no focal motor/sensory deficits  Relevant data reviewed during this visit included CBC and iron studies

## 2024-03-13 NOTE — Telephone Encounter (Signed)
 Called and left a message asking her to call the office back. Offering phone visit at 9 am today due to her having court appt this am.

## 2024-03-13 NOTE — Assessment & Plan Note (Signed)
 I recommend the patient to contact general surgery to reschedule her procedure

## 2024-03-15 ENCOUNTER — Telehealth: Payer: Self-pay

## 2024-03-15 NOTE — Telephone Encounter (Signed)
 Pt called to request a refill traMADol .

## 2024-03-16 ENCOUNTER — Other Ambulatory Visit: Payer: Self-pay | Admitting: Hematology and Oncology

## 2024-03-16 ENCOUNTER — Telehealth: Payer: Self-pay

## 2024-03-16 MED ORDER — TRAMADOL HCL 50 MG PO TABS: 100.0000 mg | ORAL_TABLET | Freq: Four times a day (QID) | ORAL | 0 refills | Status: DC | PRN

## 2024-03-16 NOTE — Telephone Encounter (Signed)
 Called and left message Tramadol  Rx sent to her pharmacy.

## 2024-03-21 ENCOUNTER — Ambulatory Visit

## 2024-03-21 VITALS — BP 97/58 | HR 101 | Temp 98.1°F | Resp 14 | Ht 64.0 in | Wt 109.0 lb

## 2024-03-21 DIAGNOSIS — D509 Iron deficiency anemia, unspecified: Secondary | ICD-10-CM

## 2024-03-21 DIAGNOSIS — D539 Nutritional anemia, unspecified: Secondary | ICD-10-CM | POA: Diagnosis not present

## 2024-03-21 MED ORDER — SODIUM CHLORIDE 0.9 % IV SOLN
510.0000 mg | Freq: Once | INTRAVENOUS | Status: AC
  Administered 2024-03-21: 510 mg via INTRAVENOUS
  Filled 2024-03-21: qty 17

## 2024-03-21 MED ORDER — DIPHENHYDRAMINE HCL 25 MG PO CAPS
25.0000 mg | ORAL_CAPSULE | Freq: Once | ORAL | Status: AC
  Administered 2024-03-21: 25 mg via ORAL
  Filled 2024-03-21: qty 1

## 2024-03-21 MED ORDER — ACETAMINOPHEN 325 MG PO TABS
650.0000 mg | ORAL_TABLET | Freq: Once | ORAL | Status: AC
  Administered 2024-03-21: 650 mg via ORAL
  Filled 2024-03-21: qty 2

## 2024-03-21 NOTE — Progress Notes (Signed)
 Diagnosis: Iron Deficiency Anemia  Provider:  Praveen Mannam MD  Procedure: IV Infusion  IV Type: Peripheral, IV Location: R Antecubital   Feraheme  (Ferumoxytol ), Dose: 510 mg  Infusion Start Time: 0900  Infusion Stop Time: 0916  Post Infusion IV Care: Observation period completed  Discharge: Condition: Good, Destination: Home . AVS Provided  Performed by:  Eleanor DELENA Bloch, RN

## 2024-03-22 ENCOUNTER — Emergency Department (HOSPITAL_COMMUNITY)

## 2024-03-22 ENCOUNTER — Observation Stay (HOSPITAL_COMMUNITY)
Admission: EM | Admit: 2024-03-22 | Discharge: 2024-03-23 | Disposition: A | Discharge status: Home / Self Care | Attending: Internal Medicine | Admitting: Internal Medicine

## 2024-03-22 ENCOUNTER — Encounter (HOSPITAL_COMMUNITY): Payer: Self-pay | Admitting: Internal Medicine

## 2024-03-22 ENCOUNTER — Other Ambulatory Visit: Payer: Self-pay

## 2024-03-22 ENCOUNTER — Telehealth: Payer: Self-pay

## 2024-03-22 DIAGNOSIS — E876 Hypokalemia: Secondary | ICD-10-CM | POA: Diagnosis not present

## 2024-03-22 DIAGNOSIS — K625 Hemorrhage of anus and rectum: Secondary | ICD-10-CM | POA: Insufficient documentation

## 2024-03-22 DIAGNOSIS — K620 Anal polyp: Secondary | ICD-10-CM | POA: Diagnosis not present

## 2024-03-22 DIAGNOSIS — Z21 Asymptomatic human immunodeficiency virus [HIV] infection status: Secondary | ICD-10-CM | POA: Diagnosis not present

## 2024-03-22 DIAGNOSIS — D75839 Thrombocytosis, unspecified: Secondary | ICD-10-CM | POA: Insufficient documentation

## 2024-03-22 DIAGNOSIS — D696 Thrombocytopenia, unspecified: Secondary | ICD-10-CM | POA: Diagnosis not present

## 2024-03-22 DIAGNOSIS — F1290 Cannabis use, unspecified, uncomplicated: Secondary | ICD-10-CM | POA: Insufficient documentation

## 2024-03-22 DIAGNOSIS — Z8571 Personal history of Hodgkin lymphoma: Secondary | ICD-10-CM | POA: Insufficient documentation

## 2024-03-22 DIAGNOSIS — D649 Anemia, unspecified: Secondary | ICD-10-CM | POA: Diagnosis not present

## 2024-03-22 DIAGNOSIS — F17210 Nicotine dependence, cigarettes, uncomplicated: Secondary | ICD-10-CM | POA: Diagnosis not present

## 2024-03-22 DIAGNOSIS — R0602 Shortness of breath: Secondary | ICD-10-CM | POA: Diagnosis present

## 2024-03-22 DIAGNOSIS — F1090 Alcohol use, unspecified, uncomplicated: Secondary | ICD-10-CM | POA: Insufficient documentation

## 2024-03-22 DIAGNOSIS — R531 Weakness: Secondary | ICD-10-CM | POA: Diagnosis present

## 2024-03-22 LAB — COMPREHENSIVE METABOLIC PANEL WITH GFR
ALT: 13 U/L (ref 0–44)
AST: 23 U/L (ref 15–41)
Albumin: 3.4 g/dL — ABNORMAL LOW (ref 3.5–5.0)
Alkaline Phosphatase: 125 U/L (ref 38–126)
Anion gap: 10 (ref 5–15)
BUN: 7 mg/dL (ref 6–20)
CO2: 25 mmol/L (ref 22–32)
Calcium: 9.4 mg/dL (ref 8.9–10.3)
Chloride: 102 mmol/L (ref 98–111)
Creatinine, Ser: 0.89 mg/dL (ref 0.44–1.00)
GFR, Estimated: >60 mL/min (ref >60)
Glucose, Bld: 149 mg/dL — ABNORMAL HIGH (ref 70–99)
Potassium: 3.4 mmol/L — ABNORMAL LOW (ref 3.5–5.1)
Sodium: 137 mmol/L (ref 135–145)
Total Bilirubin: 0.4 mg/dL (ref 0.0–1.2)
Total Protein: 8.4 g/dL — ABNORMAL HIGH (ref 6.5–8.1)

## 2024-03-22 LAB — URINALYSIS, W/ REFLEX TO CULTURE (INFECTION SUSPECTED)
Bacteria, UA: NONE SEEN
Bilirubin Urine: NEGATIVE
Glucose, UA: NEGATIVE mg/dL
Hgb urine dipstick: NEGATIVE
Ketones, ur: NEGATIVE mg/dL
Leukocytes,Ua: NEGATIVE
Nitrite: NEGATIVE
Protein, ur: NEGATIVE mg/dL
Specific Gravity, Urine: 1.01 (ref 1.005–1.030)
pH: 6 (ref 5.0–8.0)

## 2024-03-22 LAB — CBC WITH DIFFERENTIAL/PLATELET
Abs Immature Granulocytes: 0.14 K/uL — ABNORMAL HIGH (ref 0.00–0.07)
Basophils Absolute: 0 K/uL (ref 0.0–0.1)
Basophils Relative: 0 %
Eosinophils Absolute: 0.1 K/uL (ref 0.0–0.5)
Eosinophils Relative: 1 %
HCT: 17.7 % — ABNORMAL LOW (ref 36.0–46.0)
Hemoglobin: 6.1 g/dL — CL (ref 12.0–15.0)
Immature Granulocytes: 1 %
Lymphocytes Relative: 11 %
Lymphs Abs: 1 K/uL (ref 0.7–4.0)
MCH: 39.1 pg — ABNORMAL HIGH (ref 26.0–34.0)
MCHC: 34.5 g/dL (ref 30.0–36.0)
MCV: 113.5 fL — ABNORMAL HIGH (ref 80.0–100.0)
Monocytes Absolute: 0.9 K/uL (ref 0.1–1.0)
Monocytes Relative: 10 %
Neutro Abs: 7.5 K/uL (ref 1.7–7.7)
Neutrophils Relative %: 77 %
Platelets: 616 K/uL — ABNORMAL HIGH (ref 150–400)
RBC: 1.56 MIL/uL — ABNORMAL LOW (ref 3.87–5.11)
RDW: 24.1 % — ABNORMAL HIGH (ref 11.5–15.5)
WBC: 9.7 K/uL (ref 4.0–10.5)
nRBC: 0 % (ref 0.0–0.2)

## 2024-03-22 LAB — RESP PANEL BY RT-PCR (RSV, FLU A&B, COVID)  RVPGX2
Influenza A by PCR: NEGATIVE
Influenza B by PCR: NEGATIVE
Resp Syncytial Virus by PCR: NEGATIVE
SARS Coronavirus 2 by RT PCR: NEGATIVE

## 2024-03-22 LAB — PREPARE RBC (CROSSMATCH)

## 2024-03-22 LAB — VITAMIN B12: Vitamin B-12: 1675 pg/mL — ABNORMAL HIGH (ref 180–914)

## 2024-03-22 LAB — HCG, SERUM, QUALITATIVE: Preg, Serum: NEGATIVE

## 2024-03-22 LAB — I-STAT CG4 LACTIC ACID, ED: Lactic Acid, Venous: 1.9 mmol/L (ref 0.5–1.9)

## 2024-03-22 LAB — FOLATE: Folate: 11.3 ng/mL (ref >5.9)

## 2024-03-22 MED ORDER — ONDANSETRON HCL 4 MG PO TABS
4.0000 mg | ORAL_TABLET | Freq: Four times a day (QID) | ORAL | Status: DC | PRN
Start: 2024-03-22 — End: 2024-03-23

## 2024-03-22 MED ORDER — MIRTAZAPINE 15 MG PO TABS
15.0000 mg | ORAL_TABLET | Freq: Every day | ORAL | Status: DC
  Administered 2024-03-22: 15 mg via ORAL
  Filled 2024-03-22: qty 1

## 2024-03-22 MED ORDER — BICTEGRAVIR-EMTRICITAB-TENOFOV 50-200-25 MG PO TABS
1.0000 | ORAL_TABLET | Freq: Every day | ORAL | Status: DC
  Administered 2024-03-23: 1 via ORAL
  Filled 2024-03-22: qty 1

## 2024-03-22 MED ORDER — PANTOPRAZOLE SODIUM 40 MG PO TBEC: 40.0000 mg | DELAYED_RELEASE_TABLET | Freq: Every day | ORAL | Status: DC

## 2024-03-22 MED ORDER — LACTATED RINGERS IV SOLN: INTRAVENOUS | Status: DC

## 2024-03-22 MED ORDER — LACTATED RINGERS IV BOLUS
1000.0000 mL | Freq: Once | INTRAVENOUS | Status: AC
  Administered 2024-03-22: 1000 mL via INTRAVENOUS

## 2024-03-22 MED ORDER — POTASSIUM CHLORIDE CRYS ER 20 MEQ PO TBCR
40.0000 meq | EXTENDED_RELEASE_TABLET | Freq: Once | ORAL | Status: AC
  Administered 2024-03-22: 40 meq via ORAL
  Filled 2024-03-22: qty 2

## 2024-03-22 MED ORDER — SODIUM CHLORIDE 0.9% IV SOLUTION: Freq: Once | INTRAVENOUS | Status: AC

## 2024-03-22 MED ORDER — ONDANSETRON HCL 4 MG/2ML IJ SOLN: 4.0000 mg | Freq: Four times a day (QID) | INTRAMUSCULAR | Status: DC | PRN

## 2024-03-22 MED ORDER — ACETAMINOPHEN 325 MG PO TABS: 650.0000 mg | ORAL_TABLET | Freq: Four times a day (QID) | ORAL | Status: DC | PRN

## 2024-03-22 MED ORDER — TRAMADOL HCL 50 MG PO TABS: 50.0000 mg | ORAL_TABLET | Freq: Four times a day (QID) | ORAL | Status: DC | PRN

## 2024-03-22 MED ORDER — ACETAMINOPHEN 650 MG RE SUPP
650.0000 mg | Freq: Four times a day (QID) | RECTAL | Status: DC | PRN
Start: 2024-03-22 — End: 2024-03-23

## 2024-03-22 MED ORDER — ACETAMINOPHEN 500 MG PO TABS
1000.0000 mg | ORAL_TABLET | Freq: Once | ORAL | Status: AC
  Administered 2024-03-22: 1000 mg via ORAL
  Filled 2024-03-22: qty 2

## 2024-03-22 MED ORDER — ONDANSETRON HCL 4 MG/2ML IJ SOLN
4.0000 mg | Freq: Once | INTRAMUSCULAR | Status: AC
  Administered 2024-03-22: 4 mg via INTRAVENOUS
  Filled 2024-03-22: qty 2

## 2024-03-22 NOTE — ED Notes (Signed)
 Patient stepped outside. Delay in patient care.

## 2024-03-22 NOTE — H&P (Signed)
 History and Physical    [name]:[id number] DOB: August 09, 1985 DOA: 03/22/2024  I have briefly reviewed the patient's prior medical records in [facility]  PCP: Pcp, No  Patient coming from: Home  Chief Complaint: Weakness, chills  HPI: [name] is a 38 y.o. female with medical history significant of iron deficiency anemia, anal polyp, HIV, Hodgkin's lymphoma in the past comes into the hospital with complaints of feeling sick when she woke up this morning with bodyaches, generalized weakness, chills as well as nausea.  She was seen in the oncology center yesterday for her iron deficiency and received iron infusion, and she feels like this may be a side effect from that.  She denies any chest pain, denies any shortness of breath for me.  No abdominal pain, no nausea or vomiting.  Right now her main complaint is weakness.  She has a history of an anal polyp, was supposed to have it excised in September 2025 but surgery got postponed and she is about to get it rescheduled.  This has been bleeding in the past but no bleeding recently.  ED Course: In the ED she is afebrile 98.4, normotensive, satting well on room air.  Blood work reveals a potassium of 3.4, and a hemoglobin of 6.1.  Flu, COVID, RSV all negative.  Due to symptomatic anemia we are asked to admit.  EDP ordered 2 units of packed red blood cells  Review of Systems: All systems reviewed, and apart from HPI, all negative  Past Medical History:  Diagnosis Date   AIN III (anal intraepithelial neoplasia III)    Anemia    Cancer (HCC)    Hodgkin lymphoma   Chest wall pain 06/27/2015   Condyloma acuminatum in female    Depression    History of chronic bronchitis    History of esophagitis    CANDIDA   History of shingles    HIV (human immunodeficiency virus infection) (HCC)    Hodgkin's lymphoma (HCC) 06/12/2014   HSV (herpes simplex virus) infection    Hypokalemia 07/17/2014   Periodontitis, chronic    Screening  examination for venereal disease 10/30/2013    Past Surgical History:  Procedure Laterality Date   DIAGNOSTIC LAPAROSCOPY WITH REMOVAL OF ECTOPIC PREGNANCY N/A 11/17/2015   Procedure: LAPAROSCOPY LEFT  SALPINGECTOMY SECONDARY TO LEFT ECTOPIC PREGNANCY;  Surgeon: Norleen LULLA Server, MD;  Location: [facility];  Service: Gynecology;  Laterality: N/A;   DILATION AND CURETTAGE OF UTERUS  2005   MISSED AB   EXAMINATION UNDER ANESTHESIA N/A 09/23/2012   Procedure: EXAM UNDER ANESTHESIA;  Surgeon: Elspeth KYM Schultze, MD;  Location: [facility];  Service: General;  Laterality: N/A;   IR GENERIC HISTORICAL  05/26/2016   IR FLUORO GUIDE PORT INSERTION RIGHT 05/26/2016 [facility]   IR GENERIC HISTORICAL  05/26/2016   IR US  GUIDE VASC ACCESS RIGHT 05/26/2016 [facility]   IR REMOVAL TUN ACCESS W/ PORT W/O FL MOD SED  09/19/2019   LASER ABLATION CONDOLAMATA N/A 09/23/2012   Procedure: REMOVAL/ABLATION  ABLATION CONDOLAMATA WARTS;  Surgeon: Elspeth KYM Schultze, MD;  Location: [facility];  Service: General;  Laterality: N/A;     reports that she has been smoking cigars and cigarettes. She started smoking about 10 years ago. She has a 10 pack-year smoking history. She has never used smokeless tobacco. She reports current alcohol use. She reports current drug use. Drug: Marijuana.  Allergies  Allergen Reactions   Temazepam  Other (See  Comments)    confusion/dizziness     Family History  Problem Relation Age of Onset   Cancer Maternal Aunt        lung   Cancer Maternal Grandmother        unknown ca    Prior to Admission medications   Medication Sig Start Date End Date Taking? Authorizing Provider  bictegravir-emtricitabine -tenofovir  AF (BIKTARVY ) 50-200-25 MG TABS tablet Take 1 tablet by mouth daily. 12/15/23   Fleeta Kathie Jomarie LOISE, MD  Brimonidine Tartrate (LUMIFY) 0.025 % SOLN Place 1-2 drops into both eyes 2 (two) times daily as needed (irritated eyes.).    [provider]   Fluocinolone Acetonide 0.01 % OIL Apply 1 Application topically once a week.    [provider]  mirtazapine  (REMERON ) 15 MG tablet TAKE 1 TABLET(15 MG) BY MOUTH AT BEDTIME 01/12/24   Lonn Hicks, MD  Multiple Vitamin (MULTIVITAMIN WITH MINERALS) TABS tablet Take 1 tablet by mouth in the morning and at bedtime.    [provider]  omeprazole  (PRILOSEC) 40 MG capsule Take 1 capsule (40 mg total) by mouth daily. 12/15/23   Fleeta Kathie Jomarie LOISE, MD  traMADol  (ULTRAM ) 50 MG tablet Take 2 tablets (100 mg total) by mouth every 6 (six) hours as needed for up to 11 days for severe pain (pain score 7-10). 03/16/24 03/27/24  Lonn Hicks, MD    Physical Exam: Vitals:   03/22/24 1245  BP: 118/71  Pulse: 89  Resp: 16  Temp: 99.1 F (37.3 C)  TempSrc: Oral  SpO2: 100%    Constitutional: NAD, calm, comfortable Eyes: PERRL, lids and conjunctivae pale ENMT: Mucous membranes are moist. Respiratory: clear to auscultation bilaterally, no wheezing, no crackles.  Cardiovascular: Regular rate and rhythm, no murmurs / rubs / gallops. No extremity edema.  Abdomen: no tenderness, no masses palpated. Bowel sounds positive.  Musculoskeletal: no clubbing / cyanosis. Normal muscle tone.  Skin: no rashes, lesions, ulcers. No induration Neurologic: Nonfocal  Labs on Admission: I have personally reviewed following labs and imaging studies  CBC: Recent Labs  Lab 03/22/24 1433  WBC 9.7  NEUTROABS PENDING  HGB 6.1*  HCT 17.7*  MCV 113.5*  PLT 616*   Basic Metabolic Panel: Recent Labs  Lab 03/22/24 1433  NA 137  K 3.4*  CL 102  CO2 25  GLUCOSE 149*  BUN 7  CREATININE 0.89  CALCIUM  9.4   Liver Function Tests: Recent Labs  Lab 03/22/24 1433  AST 23  ALT 13  ALKPHOS 125  BILITOT 0.4  PROT 8.4*  ALBUMIN 3.4*   Coagulation Profile: No results for input(s): INR, PROTIME in the last 168 hours. BNP (last 3 results) No results for input(s): PROBNP in the last 8760  hours. CBG: No results for input(s): GLUCAP in the last 168 hours. Thyroid  Function Tests: No results for input(s): TSH, T4TOTAL, FREET4, T3FREE, THYROIDAB in the last 72 hours. Urine analysis:    Component Value Date/Time   COLORURINE YELLOW 03/22/2024 1430   APPEARANCEUR CLEAR 03/22/2024 1430   LABSPEC 1.010 03/22/2024 1430   LABSPEC 1.005 12/02/2016 1050   PHURINE 6.0 03/22/2024 1430   GLUCOSEU NEGATIVE 03/22/2024 1430   GLUCOSEU Negative 12/02/2016 1050   HGBUR NEGATIVE 03/22/2024 1430   BILIRUBINUR NEGATIVE 03/22/2024 1430   BILIRUBINUR Negative 12/02/2016 1050   KETONESUR NEGATIVE 03/22/2024 1430   PROTEINUR NEGATIVE 03/22/2024 1430   UROBILINOGEN 0.2 03/17/2018 1155   UROBILINOGEN 0.2 12/02/2016 1050   NITRITE NEGATIVE 03/22/2024 1430   LEUKOCYTESUR  NEGATIVE 03/22/2024 1430   LEUKOCYTESUR Negative 12/02/2016 1050     Radiological Exams on Admission: DG Chest 2 View Result Date: 03/22/2024 CLINICAL DATA:  Fever, nausea and diaphoresis. EXAM: CHEST - 2 VIEW COMPARISON:  12/15/2023 and CT chest 07/17/2019. FINDINGS: Trachea is midline. Heart size normal. Lungs are clear. No pleural fluid. IMPRESSION: Negative. Electronically Signed   By: Newell Eke M.D.   On: 03/22/2024 14:50   Assessment/Plan Principal problem Symptomatic anemia, macrocytic anemia-currently she has no bleeding, hemoglobin about 10 days ago in the infusion center was 7.0, currently at 6.1.  She just received iron yesterday as an outpatient.  She will be given 2 unit of packed red blood cells, repeat hemoglobin in the morning.  Suspect she could go home once hemoglobin improves appropriately - She had iron studies done recently, but B12 and folate were done a few years ago.  Repeat  Active problems HIV -continue home medications, follows with ID as an outpatient  History of Hodgkin's lymphoma in 2016 -this is likely in the setting of HIV disease, showed near complete response to ABVD but  then relapsed in 2018, status post salvage chemotherapy with complete response with no signs of recurrent disease since 2019.  Treatment course was complicated by severe anemia as mentioned in the hematology notes  Hypokalemia -this will be replaced  Anal polyp with intermittent rectal bleeding -seeing surgery, Dr. Debby as an outpatient.  Currently she has no bleeding.  Was supposed to have surgery for excision in September 2025 but this appears to have been canceled as she was on her period per patient.  She is to reschedule it  Thrombocytosis-chronic  DVT prophylaxis: SCDs  Code Status: Full code  Family Communication: mother at bedside Bed Type: Medsurg Consults called: none  Obs/Inp: Observation   Nilda Fendt, MD, PhD [facility]  Contact via [email or web address]  03/22/2024, 4:53 PM

## 2024-03-22 NOTE — Progress Notes (Signed)
 RN called blood bank to obtain status of blood availability. Blood bank stated that they are ordering blood from [facility] area and will keep RN informed of when blood is available for transfusion. Blood bank stated that blood will have to be cross and matched upon arrival. RN will continue to monitor.

## 2024-03-22 NOTE — ED Provider Notes (Signed)
 [place] EMERGENCY DEPARTMENT AT [facility] Provider Note   CSN: [id number] Arrival date & time: 03/22/24  1240     Patient presents with: Fatigue and Generalized Body Aches   [name] is a 38 y.o. female.   HPI Patient reports she had an iron transfusion yesterday.  This was her first iron transfusion.  She did not have symptoms at the time.  She reports before going in however she did have symptoms of bronchitis.  She has had little bit of cough and shortness of breath.  Today however she reports she woke up with a lot of bodyaches, generalized weakness, chills and nausea.  She does not feel that she had any increase in any chest pain or shortness of breath.  No rash.    Prior to Admission medications   Medication Sig Start Date End Date Taking? Authorizing Provider  bictegravir-emtricitabine -tenofovir  AF (BIKTARVY ) 50-200-25 MG TABS tablet Take 1 tablet by mouth daily. 12/15/23   Fleeta Kathie Jomarie LOISE, MD  Brimonidine Tartrate (LUMIFY) 0.025 % SOLN Place 1-2 drops into both eyes 2 (two) times daily as needed (irritated eyes.).    [provider]  Fluocinolone Acetonide 0.01 % OIL Apply 1 Application topically once a week.    [provider]  mirtazapine  (REMERON ) 15 MG tablet TAKE 1 TABLET(15 MG) BY MOUTH AT BEDTIME 01/12/24   Lonn Hicks, MD  Multiple Vitamin (MULTIVITAMIN WITH MINERALS) TABS tablet Take 1 tablet by mouth in the morning and at bedtime.    [provider]  omeprazole  (PRILOSEC) 40 MG capsule Take 1 capsule (40 mg total) by mouth daily. 12/15/23   Fleeta Kathie Jomarie LOISE, MD  traMADol  (ULTRAM ) 50 MG tablet Take 2 tablets (100 mg total) by mouth every 6 (six) hours as needed for up to 11 days for severe pain (pain score 7-10). 03/16/24 03/27/24  Lonn Hicks, MD    Allergies: Temazepam     Review of Systems  Updated Vital Signs BP 118/71 (BP Location: Right Arm)   Pulse 89   Temp 99.1 F (37.3 C) (Oral)   Resp 16   LMP  03/08/2024 (Approximate)   SpO2 100%   Physical Exam Constitutional:      Comments: Patient is alert and nontoxic.  Mental status clear.  No respiratory distress.  HENT:     Head: Normocephalic and atraumatic.     Mouth/Throat:     Pharynx: Oropharynx is clear.  Eyes:     Extraocular Movements: Extraocular movements intact.     Pupils: Pupils are equal, round, and reactive to light.  Cardiovascular:     Rate and Rhythm: Normal rate and regular rhythm.  Pulmonary:     Effort: Pulmonary effort is normal.     Breath sounds: Normal breath sounds.  Abdominal:     General: There is no distension.     Palpations: Abdomen is soft.     Tenderness: There is no abdominal tenderness. There is no guarding.  Musculoskeletal:        General: No swelling or tenderness. Normal range of motion.     Cervical back: Neck supple.     Right lower leg: No edema.     Left lower leg: No edema.     Comments: No peripheral edema.  Calves are soft and pliable.  Patient is moving extremities without difficulty.  Skin:    General: Skin is warm and dry.  Neurological:     General: No focal deficit present.  Mental Status: She is oriented to person, place, and time.     Motor: No weakness.     Coordination: Coordination normal.  Psychiatric:        Mood and Affect: Mood normal.     (all labs ordered are listed, but only abnormal results are displayed) Labs Reviewed  RESP PANEL BY RT-PCR (RSV, FLU A&B, COVID)  [id number]  COMPREHENSIVE METABOLIC PANEL WITH GFR  CBC WITH DIFFERENTIAL/PLATELET  URINALYSIS, W/ REFLEX TO CULTURE (INFECTION SUSPECTED)  HCG, SERUM, QUALITATIVE  I-STAT CG4 LACTIC ACID, ED    EKG: None  Radiology: DG Chest 2 View Result Date: 03/22/2024 CLINICAL DATA:  Fever, nausea and diaphoresis. EXAM: CHEST - 2 VIEW COMPARISON:  12/15/2023 and CT chest 07/17/2019. FINDINGS: Trachea is midline. Heart size normal. Lungs are clear. No pleural fluid. IMPRESSION: Negative. Electronically  Signed   By: Newell Eke M.D.   On: 03/22/2024 14:50     Procedures   Medications Ordered in the ED  lactated ringers  infusion (has no administration in time range)                                    Medical Decision Making Amount and/or Complexity of Data Reviewed Labs: ordered. Radiology: ordered.  Risk OTC drugs. Prescription drug management. Decision regarding hospitalization.   Patient presents as outlined.  She did have iron transfusion yesterday.  She had a fever of 100.5 with other generalized constitutional symptoms.  Patient does have history of HIV and lymphoma.  Clinically she is nontoxic and is not immediately showing signs of sepsis.  Will initiate broad diagnostic evaluation for infectious etiology, will initiate fluid rehydration and symptomatic treatment with Zofran  and acetaminophen .  Patient does not imminently show signs of adverse reaction to iron infusion.  She does not have respiratory distress, wheezing, syncope, rash or vital sign instability.  Hemoglobin 6.1 GFR greater than 60  At this time this appears suggestive of a symptomatic anemia.  Patient has very low hemoglobin with increasing weakness.  She got iron transfusion but at this time he is symptomatically anemic and appears to need transfusion.  Will plan for admission in the setting of symptomatic anemia and significant comorbid medical history.     Final diagnoses:  Symptomatic anemia    ED Discharge Orders     None          Armenta Canning, MD 03/26/24 2212

## 2024-03-22 NOTE — Telephone Encounter (Signed)
 Patient reported body pain following iron infusion and requested pain medication. Advised patient that the team is unavailable today due to holiday schedule and will return Friday.  Patient later called back reporting onset of vomiting. Patient was instructed to seek ER care at this time. She is currently being evaluated in the ER department.

## 2024-03-22 NOTE — ED Triage Notes (Signed)
 Pt arrives via wheelchair reporting body aches, a fever of 100.30F, nausea, and diaphoresis that began this morning. Pt states that she received an iron infusion yesterday for the first time, and is unsure if that could be the cause. Pt endorses a hx of HIV and Lymphoma.

## 2024-03-23 DIAGNOSIS — Z21 Asymptomatic human immunodeficiency virus [HIV] infection status: Secondary | ICD-10-CM

## 2024-03-23 DIAGNOSIS — D649 Anemia, unspecified: Secondary | ICD-10-CM | POA: Diagnosis not present

## 2024-03-23 LAB — CBC
HCT: 26.9 % — ABNORMAL LOW (ref 36.0–46.0)
Hemoglobin: 8.4 g/dL — ABNORMAL LOW (ref 12.0–15.0)
MCH: 30.2 pg (ref 26.0–34.0)
MCHC: 31.2 g/dL (ref 30.0–36.0)
MCV: 96.8 fL (ref 80.0–100.0)
Platelets: 406 K/uL — ABNORMAL HIGH (ref 150–400)
RBC: 2.78 MIL/uL — ABNORMAL LOW (ref 3.87–5.11)
RDW: 23 % — ABNORMAL HIGH (ref 11.5–15.5)
WBC: 8.7 K/uL (ref 4.0–10.5)
nRBC: 0.2 % (ref 0.0–0.2)

## 2024-03-23 LAB — COMPREHENSIVE METABOLIC PANEL WITH GFR
ALT: 8 U/L (ref 0–44)
AST: 18 U/L (ref 15–41)
Albumin: 2.8 g/dL — ABNORMAL LOW (ref 3.5–5.0)
Alkaline Phosphatase: 132 U/L — ABNORMAL HIGH (ref 38–126)
Anion gap: 9 (ref 5–15)
BUN: <5 mg/dL — ABNORMAL LOW (ref 6–20)
CO2: 22 mmol/L (ref 22–32)
Calcium: 9.2 mg/dL (ref 8.9–10.3)
Chloride: 106 mmol/L (ref 98–111)
Creatinine, Ser: 0.87 mg/dL (ref 0.44–1.00)
GFR, Estimated: >60 mL/min (ref >60)
Glucose, Bld: 145 mg/dL — ABNORMAL HIGH (ref 70–99)
Potassium: 4.1 mmol/L (ref 3.5–5.1)
Sodium: 137 mmol/L (ref 135–145)
Total Bilirubin: 0.4 mg/dL (ref 0.0–1.2)
Total Protein: 7 g/dL (ref 6.5–8.1)

## 2024-03-23 LAB — PHOSPHORUS: Phosphorus: 3 mg/dL (ref 2.5–4.6)

## 2024-03-23 LAB — MAGNESIUM: Magnesium: 2.2 mg/dL (ref 1.7–2.4)

## 2024-03-23 NOTE — Plan of Care (Signed)

## 2024-03-23 NOTE — Discharge Summary (Signed)
 Physician Discharge Summary   Patient: [name] MRN: [id number] DOB: [birth year]  Admit date:     03/22/2024  Discharge date: 03/23/24  Discharge Physician: Elgie Butter   PCP: Pcp, No   Recommendations at discharge:  Please follow up with oncology as scheduled.  Please check cbc in one week.   Discharge Diagnoses: Principal Problem:   Symptomatic anemia    Hospital Course: [name] is a 38 y.o. female with medical history significant of iron deficiency anemia, anal polyp, HIV, Hodgkin's lymphoma in the past comes into the hospital with complaints of feeling sick when she woke up this morning with bodyaches, generalized weakness, chills as well as nausea.  She was seen in the oncology center yesterday for her iron deficiency and received iron infusion, and she feels like this may be a side effect from that.    Blood work reveals a potassium of 3.4, and a hemoglobin of 6.1. Flu, COVID, RSV all negative. Due to symptomatic anemia we are asked to admit. EDP ordered 2 units of packed red blood cells   Assessment and Plan:  Symptomatic anemia:  S/p 2 units of pRBC transfusion.  Hemoglobin much improved post transfusion.  She is requesting to be discharged.  Recommend outpatient follow up with oncology.    HIV Resume home medications.    History of Hodgkin's lymphoma in 2016 -this is likely in the setting of HIV disease, showed near complete response to ABVD but then relapsed in 2018, status post salvage chemotherapy with complete response with no signs of recurrent disease since 2019.  Treatment course was complicated by severe anemia as mentioned in the hematology notes .  Hypokalemia -this will be replaced   Anal polyp with intermittent rectal bleeding -seeing surgery, Dr. Debby as an outpatient.  Currently she has no bleeding.  Was supposed to have surgery for excision in September 2025 but this appears to have been canceled as she was on her period per patient.   She is to reschedule it   Thrombocytosis-chronic    Consultants: none. Procedures performed: none.   Disposition: Home Diet recommendation:  Discharge Diet Orders (From admission, onward)     Start     Ordered   03/23/24 0000  Diet - low sodium heart healthy        03/23/24 1147           Regular diet DISCHARGE MEDICATION: Allergies as of 03/23/2024       Reactions   Temazepam  Other (See Comments)   Confusion/dizziness         Medication List     TAKE these medications    Biktarvy  50-200-25 MG Tabs tablet Generic drug: bictegravir-emtricitabine -tenofovir  AF Take 1 tablet by mouth daily.   desonide 0.05 % lotion Commonly known as: DESOWEN Apply 1 Application topically 2 (two) times daily as needed (for irritation - affected area).   Flintstones Plus Extra Iron 18 MG Chew Chew 1 tablet by mouth in the morning and at bedtime.   Fluocinolone Acetonide 0.01 % Oil Apply 1 Application topically See admin instructions. Apply to the scalp once a week as needed for dryness   hydrocortisone  2.5 % rectal cream Commonly known as: ANUSOL -HC Place 1 Application rectally 2 (two) times daily.   Lumify 0.025 % Soln Generic drug: Brimonidine Tartrate Place 1-2 drops into both eyes in the morning.   mirtazapine  15 MG tablet Commonly known as: REMERON  TAKE 1 TABLET(15 MG) BY MOUTH AT BEDTIME   omeprazole  40 MG  capsule Commonly known as: PRILOSEC Take 1 capsule (40 mg total) by mouth daily.   ondansetron  4 MG tablet Commonly known as: ZOFRAN  Take 4 mg by mouth every 8 (eight) hours as needed for nausea or vomiting.   traMADol  50 MG tablet Commonly known as: ULTRAM  Take 2 tablets (100 mg total) by mouth every 6 (six) hours as needed for up to 11 days for severe pain (pain score 7-10). What changed:  how much to take when to take this reasons to take this   TYLENOL  500 MG tablet Generic drug: acetaminophen  Take 500-1,000 mg by mouth every 6 (six) hours as  needed for mild pain (pain score 1-3) (or headaches).        Discharge Exam: Filed Weights   03/22/24 1829  Weight: 49.4 kg   General exam: Appears calm and comfortable  Respiratory system: Clear to auscultation. Respiratory effort normal. Cardiovascular system: S1 & S2 heard, RRR.  Gastrointestinal system: Abdomen is soft bs+ Central nervous system: Alert and oriented.  Extremities: Symmetric 5 x 5 power. Skin: No rashes,  Psychiatry: Mood & affect appropriate.    Condition at discharge: fair  The results of significant diagnostics from this hospitalization (including imaging, microbiology, ancillary and laboratory) are listed below for reference.   Imaging Studies: DG Chest 2 View Result Date: 03/22/2024 CLINICAL DATA:  Fever, nausea and diaphoresis. EXAM: CHEST - 2 VIEW COMPARISON:  12/15/2023 and CT chest 07/17/2019. FINDINGS: Trachea is midline. Heart size normal. Lungs are clear. No pleural fluid. IMPRESSION: Negative. Electronically Signed   By: Newell Eke M.D.   On: 03/22/2024 14:50    Microbiology: Results for orders placed or performed during the hospital encounter of 03/22/24  Resp panel by RT-PCR (RSV, Flu A&B, Covid) Anterior Nasal Swab     Status: None   Collection Time: 03/22/24  3:18 PM   Specimen: Anterior Nasal Swab  Result Value Ref Range Status   SARS Coronavirus 2 by RT PCR NEGATIVE NEGATIVE Final    Comment: (NOTE) SARS-CoV-2 target nucleic acids are NOT DETECTED.  The SARS-CoV-2 RNA is generally detectable in upper respiratory specimens during the acute phase of infection. The lowest concentration of SARS-CoV-2 viral copies this assay can detect is 138 copies/mL. A negative result does not preclude SARS-Cov-2 infection and should not be used as the sole basis for treatment or other patient management decisions. A negative result may occur with  improper specimen collection/handling, submission of specimen other than nasopharyngeal swab,  presence of viral mutation(s) within the areas targeted by this assay, and inadequate number of viral copies(<138 copies/mL). A negative result must be combined with clinical observations, patient history, and epidemiological information. The expected result is Negative.  Fact Sheet for Patients:  bloggercourse.com  Fact Sheet for Healthcare Providers:  seriousbroker.it  This test is no t yet approved or cleared by the [place]  FDA and  has been authorized for detection and/or diagnosis of SARS-CoV-2 by FDA under an Emergency Use Authorization (EUA). This EUA will remain  in effect (meaning this test can be used) for the duration of the COVID-19 declaration under Section 564(b)(1) of the Act, 21 U.S.C.section 360bbb-3(b)(1), unless the authorization is terminated  or revoked sooner.       Influenza A by PCR NEGATIVE NEGATIVE Final   Influenza B by PCR NEGATIVE NEGATIVE Final    Comment: (NOTE) The Xpert Xpress SARS-CoV-2/FLU/RSV plus assay is intended as an aid in the diagnosis of influenza from Nasopharyngeal swab specimens and should not  be used as a sole basis for treatment. Nasal washings and aspirates are unacceptable for Xpert Xpress SARS-CoV-2/FLU/RSV testing.  Fact Sheet for Patients: bloggercourse.com  Fact Sheet for Healthcare Providers: seriousbroker.it  This test is not yet approved or cleared by the [place]  FDA and has been authorized for detection and/or diagnosis of SARS-CoV-2 by FDA under an Emergency Use Authorization (EUA). This EUA will remain in effect (meaning this test can be used) for the duration of the COVID-19 declaration under Section 564(b)(1) of the Act, 21 U.S.C. section 360bbb-3(b)(1), unless the authorization is terminated or revoked.     Resp Syncytial Virus by PCR NEGATIVE NEGATIVE Final    Comment: (NOTE) Fact Sheet for  Patients: bloggercourse.com  Fact Sheet for Healthcare Providers: seriousbroker.it  This test is not yet approved or cleared by the [place]  FDA and has been authorized for detection and/or diagnosis of SARS-CoV-2 by FDA under an Emergency Use Authorization (EUA). This EUA will remain in effect (meaning this test can be used) for the duration of the COVID-19 declaration under Section 564(b)(1) of the Act, 21 U.S.C. section 360bbb-3(b)(1), unless the authorization is terminated or revoked.  Performed at [facility], [address], [place], [place] [address]    *Note: Due to a large number of results and/or encounters for the requested time period, some results have not been displayed. A complete set of results can be found in Results Review.    Labs: CBC: Recent Labs  Lab 03/22/24 1433 03/23/24 0941  WBC 9.7 8.7  NEUTROABS 7.5  --   HGB 6.1* 8.4*  HCT 17.7* 26.9*  MCV 113.5* 96.8  PLT 616* 406*   Basic Metabolic Panel: Recent Labs  Lab 03/22/24 1433 03/23/24 0941  NA 137 137  K 3.4* 4.1  CL 102 106  CO2 25 22  GLUCOSE 149* 145*  BUN 7 <5*  CREATININE 0.89 0.87  CALCIUM  9.4 9.2  MG  --  2.2  PHOS  --  3.0   Liver Function Tests: Recent Labs  Lab 03/22/24 1433 03/23/24 0941  AST 23 18  ALT 13 8  ALKPHOS 125 132*  BILITOT 0.4 0.4  PROT 8.4* 7.0  ALBUMIN 3.4* 2.8*   CBG: No results for input(s): GLUCAP in the last 168 hours.  Discharge time spent: 38 minutes.   Signed: Elgie Butter, MD [facility] 03/23/2024

## 2024-03-24 ENCOUNTER — Encounter: Payer: Self-pay | Admitting: Hematology and Oncology

## 2024-03-24 LAB — TYPE AND SCREEN
ABO/RH(D): O NEG
Antibody Screen: POSITIVE
DAT, IgG: POSITIVE
Unit division: 0
Unit division: 0

## 2024-03-24 LAB — BPAM RBC
Blood Product Expiration Date: 202512242359
Blood Product Expiration Date: 202512242359
ISSUE DATE / TIME: 202511270056
ISSUE DATE / TIME: 202511270427
Unit Type and Rh: 9500
Unit Type and Rh: 9500

## 2024-03-24 NOTE — Telephone Encounter (Signed)
 FYI

## 2024-03-25 ENCOUNTER — Other Ambulatory Visit: Payer: Self-pay | Admitting: Hematology and Oncology

## 2024-03-27 ENCOUNTER — Other Ambulatory Visit: Payer: Self-pay | Admitting: Hematology and Oncology

## 2024-03-27 ENCOUNTER — Telehealth: Payer: Self-pay

## 2024-03-27 ENCOUNTER — Encounter: Payer: Self-pay | Admitting: Hematology and Oncology

## 2024-03-27 DIAGNOSIS — K625 Hemorrhage of anus and rectum: Secondary | ICD-10-CM

## 2024-03-27 MED ORDER — TRAMADOL HCL 50 MG PO TABS: 100.0000 mg | ORAL_TABLET | Freq: Four times a day (QID) | ORAL | 0 refills | Status: DC | PRN

## 2024-03-27 NOTE — Telephone Encounter (Signed)
 Called to see how she is doing today. She is feeling better after blood transfusion. Told her Dr. Lonn sent a referral to GI for anemia and her symptoms are not from IV iron but from her anemia.  She verbalized understanding and will keep appt tomorrow for IV iron.  She is requesting Tramadol  refill to [facility].

## 2024-03-27 NOTE — Telephone Encounter (Signed)
 Called and left a message Rx sent.

## 2024-03-28 ENCOUNTER — Ambulatory Visit

## 2024-03-28 VITALS — BP 96/61 | HR 83 | Temp 98.0°F | Resp 16 | Ht 64.0 in | Wt 110.6 lb

## 2024-03-28 DIAGNOSIS — D509 Iron deficiency anemia, unspecified: Secondary | ICD-10-CM

## 2024-03-28 DIAGNOSIS — D539 Nutritional anemia, unspecified: Secondary | ICD-10-CM | POA: Diagnosis not present

## 2024-03-28 MED ORDER — SODIUM CHLORIDE 0.9 % IV SOLN
510.0000 mg | Freq: Once | INTRAVENOUS | Status: AC
  Administered 2024-03-28: 510 mg via INTRAVENOUS
  Filled 2024-03-28: qty 17

## 2024-03-28 MED ORDER — DIPHENHYDRAMINE HCL 25 MG PO CAPS
25.0000 mg | ORAL_CAPSULE | Freq: Once | ORAL | Status: AC
  Administered 2024-03-28: 25 mg via ORAL
  Filled 2024-03-28: qty 1

## 2024-03-28 MED ORDER — ACETAMINOPHEN 325 MG PO TABS
650.0000 mg | ORAL_TABLET | Freq: Once | ORAL | Status: AC
  Administered 2024-03-28: 650 mg via ORAL
  Filled 2024-03-28: qty 2

## 2024-03-28 NOTE — Progress Notes (Signed)
 Diagnosis: Iron Deficiency Anemia  Provider:  Mannam, Praveen MD  Procedure: IV Infusion  IV Type: Peripheral, IV Location: R Antecubital   Feraheme  (Ferumoxytol ), Dose: 510 mg  Infusion Start Time: 1503  Infusion Stop Time: 1519  Post Infusion IV Care: Patient declined observation and Peripheral IV Discontinued  Discharge: Condition: Good, Destination: Home . AVS Declined  Performed by:  Maximiano JONELLE Pouch, LPN

## 2024-04-03 ENCOUNTER — Telehealth: Payer: Self-pay

## 2024-04-03 ENCOUNTER — Other Ambulatory Visit: Payer: Self-pay | Admitting: Hematology and Oncology

## 2024-04-03 MED ORDER — PREDNISONE 20 MG PO TABS: 20.0000 mg | ORAL_TABLET | Freq: Every day | ORAL | 0 refills | Status: DC

## 2024-04-03 NOTE — Telephone Encounter (Signed)
 Due to her HIV disease I think she needs to get it OK from ID first

## 2024-04-03 NOTE — Telephone Encounter (Signed)
 No PCP on file. Looks like oncology wanted to get an okay from you before sending it in.

## 2024-04-03 NOTE — Telephone Encounter (Signed)
 Returned her call. She has a bad cough/ cold for a couple of days. 2 days ago she fever but denies fever since then. She has a productive yellow sputum with cough mainly at night.  She is asking if Dr. Lonn can send prednisone  to the pharmacy as before for cough? She uses [facility] on [address].

## 2024-04-03 NOTE — Telephone Encounter (Signed)
 Patient called office to request prescription for prednisone . States she tried to speak with oncology team but was told to contact Dr. Fleeta dam. Per oncology note- pt reports bad cough/ cold x 2 dats. Denies fever at this time. Producing yellow sputum with cough at night.  If okay to take would like script sent to [facility] on [address] [place] Code, [place]

## 2024-04-03 NOTE — Telephone Encounter (Signed)
 Called and given below message. She verbalized understanding.

## 2024-04-03 NOTE — Telephone Encounter (Signed)
 Erminio, I sent prednisone  for 7 days to her pharmacy

## 2024-04-04 ENCOUNTER — Telehealth: Payer: Self-pay

## 2024-04-04 NOTE — Telephone Encounter (Signed)
 Called and left a message that Dr. Lonn sent Prednisone  Rx to her preferred pharmacy. Ask her to call the office for questions.

## 2024-04-11 ENCOUNTER — Telehealth: Payer: Self-pay

## 2024-04-11 ENCOUNTER — Other Ambulatory Visit: Payer: Self-pay | Admitting: Hematology and Oncology

## 2024-04-11 MED ORDER — TRAMADOL HCL 50 MG PO TABS: 100.0000 mg | ORAL_TABLET | Freq: Four times a day (QID) | ORAL | 0 refills | Status: DC | PRN

## 2024-04-11 NOTE — Telephone Encounter (Signed)
 She called and left a message requesting Tramadol refill to [facility] on [address] please.

## 2024-04-11 NOTE — Telephone Encounter (Signed)
 Called and given below message. She verbalized understanding.

## 2024-04-12 ENCOUNTER — Telehealth: Payer: Self-pay

## 2024-04-12 NOTE — Telephone Encounter (Signed)
 LVM for pt regarding her disability form. Left call Back number.

## 2024-04-14 ENCOUNTER — Inpatient Hospital Stay: Attending: Hematology and Oncology

## 2024-04-14 ENCOUNTER — Inpatient Hospital Stay: Admitting: Hematology and Oncology

## 2024-04-14 ENCOUNTER — Telehealth: Payer: Self-pay

## 2024-04-14 VITALS — BP 100/73 | Temp 98.4°F | Ht 64.0 in | Wt 106.8 lb

## 2024-04-14 DIAGNOSIS — C8118 Nodular sclerosis classical Hodgkin lymphoma, lymph nodes of multiple sites: Secondary | ICD-10-CM

## 2024-04-14 DIAGNOSIS — K625 Hemorrhage of anus and rectum: Secondary | ICD-10-CM

## 2024-04-14 DIAGNOSIS — D509 Iron deficiency anemia, unspecified: Secondary | ICD-10-CM | POA: Diagnosis not present

## 2024-04-14 DIAGNOSIS — D539 Nutritional anemia, unspecified: Secondary | ICD-10-CM | POA: Diagnosis not present

## 2024-04-14 DIAGNOSIS — J439 Emphysema, unspecified: Secondary | ICD-10-CM | POA: Insufficient documentation

## 2024-04-14 DIAGNOSIS — C811A Nodular sclerosis Hodgkin lymphoma, in remission: Secondary | ICD-10-CM | POA: Insufficient documentation

## 2024-04-14 DIAGNOSIS — B20 Human immunodeficiency virus [HIV] disease: Secondary | ICD-10-CM | POA: Insufficient documentation

## 2024-04-14 DIAGNOSIS — F17200 Nicotine dependence, unspecified, uncomplicated: Secondary | ICD-10-CM | POA: Diagnosis not present

## 2024-04-14 LAB — CBC WITH DIFFERENTIAL (CANCER CENTER ONLY)
Abs Immature Granulocytes: 0.12 K/uL — ABNORMAL HIGH (ref 0.00–0.07)
Basophils Absolute: 0 K/uL (ref 0.0–0.1)
Basophils Relative: 0 %
Eosinophils Absolute: 0 K/uL (ref 0.0–0.5)
Eosinophils Relative: 0 %
HCT: 29.7 % — ABNORMAL LOW (ref 36.0–46.0)
Hemoglobin: 9.6 g/dL — ABNORMAL LOW (ref 12.0–15.0)
Immature Granulocytes: 1 %
Lymphocytes Relative: 7 %
Lymphs Abs: 1.2 K/uL (ref 0.7–4.0)
MCH: 31.4 pg (ref 26.0–34.0)
MCHC: 32.3 g/dL (ref 30.0–36.0)
MCV: 97.1 fL (ref 80.0–100.0)
Monocytes Absolute: 1.5 K/uL — ABNORMAL HIGH (ref 0.1–1.0)
Monocytes Relative: 9 %
Neutro Abs: 13.2 K/uL — ABNORMAL HIGH (ref 1.7–7.7)
Neutrophils Relative %: 83 %
Platelet Count: 546 K/uL — ABNORMAL HIGH (ref 150–400)
RBC: 3.06 MIL/uL — ABNORMAL LOW (ref 3.87–5.11)
RDW: 20.6 % — ABNORMAL HIGH (ref 11.5–15.5)
WBC Count: 15.9 K/uL — ABNORMAL HIGH (ref 4.0–10.5)
nRBC: 0 % (ref 0.0–0.2)

## 2024-04-14 LAB — IRON AND IRON BINDING CAPACITY (CC-WL,HP ONLY)
Iron: 25 ug/dL — ABNORMAL LOW (ref 28–170)
Saturation Ratios: 14 % (ref 10.4–31.8)
TIBC: 182 ug/dL — ABNORMAL LOW (ref 250–450)
UIBC: 157 ug/dL

## 2024-04-14 LAB — VITAMIN B12: Vitamin B-12: 749 pg/mL (ref 180–914)

## 2024-04-14 LAB — FERRITIN: Ferritin: 3180 ng/mL — ABNORMAL HIGH (ref 11–307)

## 2024-04-14 NOTE — Assessment & Plan Note (Addendum)
 She has progressive iron deficiency anemia This is likely due to rectal bleeding and poor dietary oral iron intake She is somewhat symptomatic She has received 2 doses of IV iron Her blood count is slowly improving Observe closely Plan to recheck her blood and iron studies in 3 months

## 2024-04-14 NOTE — Telephone Encounter (Signed)
-----   Message from Almarie Bedford, MD sent at 04/14/2024 11:17 AM EST ----- Her blood tests came back ok No need IV iron She needs to gain some weight

## 2024-04-14 NOTE — Assessment & Plan Note (Addendum)
 She had recent cough She was prescribed dose of prednisone  Her lung exam is benign I recommend the patient to stop smoking cigar

## 2024-04-14 NOTE — Progress Notes (Signed)
 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, CD30 positive She has complete response with no signs of recurrent disease since 2019  She is being follow-up Clinically in remission with no signs or symptoms to suggest recurrent disease  Deficiency anemia She has progressive iron deficiency anemia This is likely due to rectal bleeding and poor dietary oral iron intake She is somewhat symptomatic She has received 2 doses of IV iron Her blood count is slowly improving Observe closely Plan to recheck her blood and iron studies in 3 months Emphysema of lung (HCC) She had recent cough She was prescribed dose of prednisone  Her lung exam is benign I recommend the patient to stop smoking cigar  Orders Placed This Encounter  Procedures   CMP (Cancer Center only)    Standing Status:   Future    Expiration Date:   04/14/2025   CBC with Differential (Cancer Center Only)    Standing Status:   Future    Expiration Date:   04/14/2025   Iron and Iron Binding Capacity ([facility] only)    Standing Status:    Future    Expiration Date:   04/14/2025   Ferritin    Standing Status:   Future    Expiration Date:   04/14/2025     Almarie Bedford, MD  INTERVAL HISTORY: she returns for surveillance follow-up for history of Hodgkin lymphoma, recent iron deficiency anemia status post IV iron infusion She had recent cough but no fever or chills She was prescribed a course of prednisone  She is still smoking cigar I reviewed her test results and discussed future follow-up  PHYSICAL EXAMINATION: ECOG PERFORMANCE STATUS: 1 - Symptomatic but completely ambulatory  Vitals:   04/14/24 1000  BP: 100/73  Temp: 98.4 F (36.9 C)   Filed Weights   04/14/24 1000  Weight: 106 lb 12.8 oz (48.4 kg)   Limited lung exam revealed normal breath sounds without respiratory wheezes Relevant data reviewed during this visit included CBC, iron studies, vitamin B12

## 2024-04-14 NOTE — Assessment & Plan Note (Addendum)
 The patient was diagnosed with Hodgkin lymphoma in 2016 in the setting of HIV disease Treatment course was complicated by severe anemia, noncompliance with appointment etc. 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, CD30 positive She has complete response with no signs of recurrent disease since 2019  She is being follow-up Clinically in remission with no signs or symptoms to suggest recurrent disease

## 2024-04-14 NOTE — Telephone Encounter (Signed)
 Called and given below message. She verbalized understanding.

## 2024-04-21 ENCOUNTER — Emergency Department (HOSPITAL_COMMUNITY)
Admission: EM | Admit: 2024-04-21 | Discharge: 2024-04-21 | Disposition: A | Discharge status: Home / Self Care | Attending: Emergency Medicine | Admitting: Emergency Medicine

## 2024-04-21 ENCOUNTER — Encounter (HOSPITAL_COMMUNITY): Payer: Self-pay

## 2024-04-21 ENCOUNTER — Other Ambulatory Visit: Payer: Self-pay

## 2024-04-21 DIAGNOSIS — N9089 Other specified noninflammatory disorders of vulva and perineum: Secondary | ICD-10-CM | POA: Insufficient documentation

## 2024-04-21 DIAGNOSIS — K644 Residual hemorrhoidal skin tags: Secondary | ICD-10-CM | POA: Diagnosis present

## 2024-04-21 MED ORDER — MUPIROCIN CALCIUM 2 % EX CREA: 1.0000 | TOPICAL_CREAM | Freq: Two times a day (BID) | CUTANEOUS | 0 refills | Status: DC

## 2024-04-21 NOTE — Discharge Instructions (Signed)
 Recommend he follow-up with a primary care for continuing evaluation of the hemorrhoids as well as any further concerns related to the wound that was noted today.  Use antibiotic cream as needed, keep area clean and dry and wear loosefitting undergarments as discussed.

## 2024-04-21 NOTE — ED Provider Notes (Signed)
 " [place] EMERGENCY DEPARTMENT AT [facility] Provider Note   CSN: [id number] Arrival date & time: 04/21/24  1647     Patient presents with: Hemorrhoids   [name] is a 38 y.o. female has previous history of hemorrhoids, states that she has itching in the perianal area however does not have any pain, does still have a noted external hemorrhoid.  Also states that she had a blister on her external genitalia recently that popped, had some initial drainage however now this has a red sore area on her vulva.   HPI     Prior to Admission medications  Medication Sig Start Date End Date Taking? Authorizing Provider  mupirocin  cream (BACTROBAN ) 2 % Apply 1 Application topically 2 (two) times daily. 04/21/24  Yes Myriam Dorn BROCKS, PA  bictegravir-emtricitabine -tenofovir  AF (BIKTARVY ) 50-200-25 MG TABS tablet Take 1 tablet by mouth daily. 12/15/23   Fleeta Kathie Jomarie LOISE, MD  Brimonidine Tartrate (LUMIFY) 0.025 % SOLN Place 1-2 drops into both eyes in the morning.    [provider]  desonide (DESOWEN) 0.05 % lotion Apply 1 Application topically 2 (two) times daily as needed (for irritation - affected area).    [provider]  Fluocinolone Acetonide 0.01 % OIL Apply 1 Application topically See admin instructions. Apply to the scalp once a week as needed for dryness    [provider]  hydrocortisone  (ANUSOL -HC) 2.5 % rectal cream Place 1 Application rectally 2 (two) times daily.    [provider]  mirtazapine  (REMERON ) 15 MG tablet TAKE 1 TABLET(15 MG) BY MOUTH AT BEDTIME 03/27/24   Lonn Hicks, MD  omeprazole  (PRILOSEC) 40 MG capsule Take 1 capsule (40 mg total) by mouth daily. Patient not taking: Reported on 03/22/2024 12/15/23   Fleeta Kathie, Jomarie LOISE, MD  ondansetron  (ZOFRAN ) 4 MG tablet Take 4 mg by mouth every 8 (eight) hours as needed for nausea or vomiting.    [provider]  Pediatric Multivitamins-Iron (FLINTSTONES PLUS EXTRA  IRON) 18 MG CHEW Chew 1 tablet by mouth in the morning and at bedtime.    [provider]  predniSONE  (DELTASONE ) 20 MG tablet Take 1 tablet (20 mg total) by mouth daily with breakfast. 04/03/24   Lonn Hicks, MD  traMADol  (ULTRAM ) 50 MG tablet Take 2 tablets (100 mg total) by mouth every 6 (six) hours as needed for severe pain (pain score 7-10). 04/11/24 04/22/24  Lonn Hicks, MD  TYLENOL  500 MG tablet Take 500-1,000 mg by mouth every 6 (six) hours as needed for mild pain (pain score 1-3) (or headaches).    [provider]    Allergies: Temazepam     Review of Systems  Skin:  Positive for wound.  All other systems reviewed and are negative.   Updated Vital Signs BP (!) 114/93 (BP Location: Right Arm)   Pulse 95   Temp 99.8 F (37.7 C) (Oral)   Resp 16   Ht 5' 4 (1.626 m)   Wt 48.1 kg   LMP 03/08/2024 (Approximate)   SpO2 99%   BMI 18.19 kg/m   Physical Exam Vitals and nursing note reviewed.  Constitutional:      General: She is not in acute distress.    Appearance: Normal appearance.  HENT:     Head: Normocephalic and atraumatic.     Mouth/Throat:     Mouth: Mucous membranes are moist.     Pharynx: Oropharynx is clear.  Eyes:     Extraocular Movements: Extraocular movements  intact.     Conjunctiva/sclera: Conjunctivae normal.     Pupils: Pupils are equal, round, and reactive to light.  Cardiovascular:     Rate and Rhythm: Normal rate and regular rhythm.     Pulses: Normal pulses.     Heart sounds: Normal heart sounds. No murmur heard.    No friction rub. No gallop.  Pulmonary:     Effort: Pulmonary effort is normal.     Breath sounds: Normal breath sounds.  Abdominal:     General: Abdomen is flat. Bowel sounds are normal.     Palpations: Abdomen is soft.  Genitourinary:    Rectum: External hemorrhoid present.      Comments: Small erythematous lesion noted to the vulva in the noted area, is epithelialized and there is no purulence, mildly tender  to palpation during exam.  No noted PERI lesion erythema.  There is no anal fissure or other concerning findings on external rectal exam, does appear to be a potential skin tag at the 12 o'clock position on the anus, however no signs of thrombosed external hemorrhoid.  No other lesions appreciated. Musculoskeletal:        General: Normal range of motion.     Cervical back: Normal range of motion and neck supple.     Right lower leg: No edema.     Left lower leg: No edema.  Skin:    General: Skin is warm and dry.     Capillary Refill: Capillary refill takes less than 2 seconds.  Neurological:     General: No focal deficit present.     Mental Status: She is alert. Mental status is at baseline.  Psychiatric:        Mood and Affect: Mood normal.     (all labs ordered are listed, but only abnormal results are displayed) Labs Reviewed - No data to display  EKG: None  Radiology: No results found.   Procedures   Medications Ordered in the ED - No data to display                                  Medical Decision Making Risk Prescription drug management.   Given the presenting signs and symptoms as well as physical exam, initially considered possible abscess or cellulitis of the external genitalia however on physical exam there is a red erythematous lesion that is well granulated and epithelialized, and for seems to be a well-healing lesion without any apparent cellulitis.  Will manage this with topical mupirocin , and follow-up to primary care for following up for wound healing.  Regarding the hemorrhoid, have her continue with her previously prescribed hydrocortisone  cream for itching, as well as lidocaine  over-the-counter relief wipes for symptomatic management.  Does not appear to be any thrombosed hemorrhoid or any other concerning findings on the physical exam.  Given that we will discharge this patient with outpatient follow-up for external hemorrhoids that are noncomplicated as  well as a noncomplicated vulvar lesion that is healing appropriately.     Final diagnoses:  External hemorrhoids  Vulvar lesion    ED Discharge Orders          Ordered    mupirocin  cream (BACTROBAN ) 2 %  2 times daily        04/21/24 1754               Myriam Dorn BROCKS, [place] 04/21/24 2148    Mannie Fairy DASEN,  DO 04/23/24 1519  "

## 2024-04-21 NOTE — ED Triage Notes (Signed)
 Pt states she has a hemorrhoid, has been on hydrocortisone  cream but it is not working. Pt states she has a bump in the vaginal area. Denies pain, denies vaginal pain.

## 2024-04-24 ENCOUNTER — Other Ambulatory Visit: Payer: Self-pay

## 2024-04-24 ENCOUNTER — Telehealth: Payer: Self-pay

## 2024-04-24 NOTE — Telephone Encounter (Signed)
 Reason for Call:   Request received to refill tramadol . Current prescription has expired.  Action Taken:   Refill request forwarded to Dr. Lonn for review.

## 2024-04-25 ENCOUNTER — Telehealth: Payer: Self-pay | Admitting: *Deleted

## 2024-04-25 ENCOUNTER — Other Ambulatory Visit: Payer: Self-pay | Admitting: Hematology and Oncology

## 2024-04-25 MED ORDER — TRAMADOL HCL 50 MG PO TABS: 100.0000 mg | ORAL_TABLET | Freq: Four times a day (QID) | ORAL | 0 refills | Status: DC | PRN

## 2024-04-25 NOTE — Telephone Encounter (Signed)
 Ms. Mccabe called to ask if message from yesterday seen by MD regarding Tramadol  refill requested. Informed her that RX escribed this AM by Dr. Lonn and noted as received by pharmacy @ [phone]. Ms. Larzelere verbalized understanding of information.

## 2024-04-28 ENCOUNTER — Telehealth: Payer: Self-pay

## 2024-04-28 NOTE — Telephone Encounter (Signed)
 Called back and offered appt on 1/13. She is going to call infectious disease to see if she can get a earlier appt and then call the office back.

## 2024-04-28 NOTE — Telephone Encounter (Signed)
 Returned her call. She has had a fever intermittent for 1 and 1/2 months. Today her temperature is 100, her temperature goes up to 100.7 at times. She is complaining of non productive cough and night sweats. She is able to eat. She went to the ER on 12/26 for the fever.  She is asking if you have any recommendations?

## 2024-04-28 NOTE — Telephone Encounter (Signed)
 I have no way to assess her today I can see her next week or she can contact infectious disease clinic to be seen sooner

## 2024-05-02 ENCOUNTER — Other Ambulatory Visit: Payer: Self-pay

## 2024-05-02 ENCOUNTER — Ambulatory Visit: Payer: Medicare (Managed Care) | Admitting: Infectious Diseases

## 2024-05-02 ENCOUNTER — Encounter: Payer: Self-pay | Admitting: Infectious Diseases

## 2024-05-02 ENCOUNTER — Other Ambulatory Visit (HOSPITAL_COMMUNITY)
Admission: RE | Admit: 2024-05-02 | Discharge: 2024-05-02 | Disposition: A | Discharge status: Home / Self Care | Payer: Medicare (Managed Care) | Source: Ambulatory Visit | Attending: Infectious Diseases | Admitting: Infectious Diseases

## 2024-05-02 VITALS — BP 102/70 | HR 63 | Temp 98.4°F | Ht 64.0 in | Wt 106.0 lb

## 2024-05-02 DIAGNOSIS — Z113 Encounter for screening for infections with a predominantly sexual mode of transmission: Secondary | ICD-10-CM

## 2024-05-02 DIAGNOSIS — R509 Fever, unspecified: Secondary | ICD-10-CM

## 2024-05-02 DIAGNOSIS — C8190 Hodgkin lymphoma, unspecified, unspecified site: Secondary | ICD-10-CM

## 2024-05-02 DIAGNOSIS — B20 Human immunodeficiency virus [HIV] disease: Secondary | ICD-10-CM | POA: Diagnosis not present

## 2024-05-02 NOTE — Progress Notes (Addendum)
 "                                                                                                                                                                                                      [address], [place], [place], [address]                                                                  Phn. [phone]; Fax: [phone]                                                                             Date: 05/02/24  Reason for Visit: Fevers    HPI: [name] is a 39 y.o.old female with a history of Hodgkin's lymphoma in remission followed by Dr. Lonn,  HIV followed by Dr Fleeta Rothman who is here for fevers.   She is accompanied with her partner.  Reports for the past 1.5 months, she has had intermittent fevers up to 101F, occurring sporadically when she checks in the morning and afternoon. During febrile episodes she has dizziness when moving from lying to standing, requiring her to pause and stabilize. The dizziness improves with rest. She also had intermittent severe night sweats for the past three years, beginning after completing chemotherapy in 2022. She is not on current chemotherapy. She has a mild cough and reports h/o bronchitis. She denies headache, blurry vision, chest pain, and shortness of breath. She had back pain yesterday that resolved. She has occasional nausea but no vomiting, diarrhea, or genitourinary symptoms. She reports a 5-pound weight loss over the past month, which she attributes to a recent cold.  She is not working, has had no recent travel, has no pets at home, and denies being sexually active. She has minimal superfical healing wound in the left arm that she attributes to picking. Vulvar lesions noted during ED visit 12/26 have resolved. No difficulty/pain during swallowing, joint pain/swelling, rashes. Appetite is good.   She is compliant with Biktarvy  with no missed doses or concerns.   ROS: As  stated in above HPI; all other systems were reviewed  and are otherwise negative unless noted below  PMH/ PSH/ FamHx / Social Hx , medications and allergies reviewed and updated as appropriate; please see corresponding tab in EHR / prior notes                                        Outpatient Encounter Medications as of 05/02/2024  Medication Sig   bictegravir-emtricitabine -tenofovir  AF (BIKTARVY ) 50-200-25 MG TABS tablet Take 1 tablet by mouth daily.   Brimonidine Tartrate (LUMIFY) 0.025 % SOLN Place 1-2 drops into both eyes in the morning.   desonide (DESOWEN) 0.05 % lotion Apply 1 Application topically 2 (two) times daily as needed (for irritation - affected area).   Fluocinolone Acetonide 0.01 % OIL Apply 1 Application topically See admin instructions. Apply to the scalp once a week as needed for dryness   hydrocortisone  (ANUSOL -HC) 2.5 % rectal cream Place 1 Application rectally 2 (two) times daily.   mirtazapine  (REMERON ) 15 MG tablet TAKE 1 TABLET(15 MG) BY MOUTH AT BEDTIME   mupirocin  cream (BACTROBAN ) 2 % Apply 1 Application topically 2 (two) times daily.   ondansetron  (ZOFRAN ) 4 MG tablet Take 4 mg by mouth every 8 (eight) hours as needed for nausea or vomiting.   Pediatric Multivitamins-Iron (FLINTSTONES PLUS EXTRA IRON) 18 MG CHEW Chew 1 tablet by mouth in the morning and at bedtime.   traMADol  (ULTRAM ) 50 MG tablet Take 2 tablets (100 mg total) by mouth every 6 (six) hours as needed for severe pain (pain score 7-10).   TYLENOL  500 MG tablet Take 500-1,000 mg by mouth every 6 (six) hours as needed for mild pain (pain score 1-3) (or headaches).   omeprazole  (PRILOSEC) 40 MG capsule Take 1 capsule (40 mg total) by mouth daily. (Patient not taking: Reported on 05/02/2024)   predniSONE  (DELTASONE ) 20 MG tablet Take 1 tablet (20 mg total) by mouth daily with breakfast. (Patient not taking: Reported on 05/02/2024)   No facility-administered encounter medications on file as of 05/02/2024.    Allergies[1]  Past Medical History:  Diagnosis Date    AIN III (anal intraepithelial neoplasia III)    Anemia    Cancer (HCC)    Hodgkin lymphoma   Chest wall pain 06/27/2015   Condyloma acuminatum in female    Depression    History of chronic bronchitis    History of esophagitis    CANDIDA   History of shingles    HIV (human immunodeficiency virus infection) (HCC)    Hodgkin's lymphoma (HCC) 06/12/2014   HSV (herpes simplex virus) infection    Hypokalemia 07/17/2014   Periodontitis, chronic    Screening examination for venereal disease 10/30/2013   Past Surgical History:  Procedure Laterality Date   DIAGNOSTIC LAPAROSCOPY WITH REMOVAL OF ECTOPIC PREGNANCY N/A 11/17/2015   Procedure: LAPAROSCOPY LEFT  SALPINGECTOMY SECONDARY TO LEFT ECTOPIC PREGNANCY;  Surgeon: Norleen LULLA Server, MD;  Location: [facility];  Service: Gynecology;  Laterality: N/A;   DILATION AND CURETTAGE OF UTERUS  2005   MISSED AB   EXAMINATION UNDER ANESTHESIA N/A 09/23/2012   Procedure: EXAM UNDER ANESTHESIA;  Surgeon: Elspeth KYM Schultze, MD;  Location: [facility];  Service: General;  Laterality: N/A;   IR GENERIC HISTORICAL  05/26/2016   IR FLUORO GUIDE PORT INSERTION RIGHT 05/26/2016 [facility]   IR GENERIC HISTORICAL  05/26/2016  IR US  GUIDE VASC ACCESS RIGHT 05/26/2016 [facility]   IR REMOVAL TUN ACCESS W/ PORT W/O FL MOD SED  09/19/2019   LASER ABLATION CONDOLAMATA N/A 09/23/2012   Procedure: REMOVAL/ABLATION  ABLATION CONDOLAMATA WARTS;  Surgeon: Elspeth KYM Schultze, MD;  Location: [facility];  Service: General;  Laterality: N/A;   Social History   Socioeconomic History   Marital status: Single    Spouse name: Not on file   Number of children: Not on file   Years of education: Not on file   Highest education level: Not on file  Occupational History   Not on file  Tobacco Use   Smoking status: Heavy Smoker    Current packs/day: 1.00    Average packs/day: 1 pack/day for 10.1 years (10.1 ttl pk-yrs)    Types: Cigars, Cigarettes    Start  date: 03/19/2014   Smokeless tobacco: Never   Tobacco comments:    she smokes 3 Black and Mild Cigars daily  Vaping Use   Vaping status: Never Used  Substance and Sexual Activity   Alcohol use: Yes    Comment: rarely   Drug use: Yes    Types: Marijuana    Comment: 2 blunts per day   Sexual activity: Yes    Partners: Male    Birth control/protection: None    Comment: 1ST intercourse- 17, partners- 7, declined condoms  Other Topics Concern   Not on file  Social History Narrative   Not on file   Social Drivers of Health   Tobacco Use: High Risk (04/21/2024)   Patient History    Smoking Tobacco Use: Heavy Smoker    Smokeless Tobacco Use: Never    Passive Exposure: Not on file  Financial Resource Strain: Not on file  Food Insecurity: No Food Insecurity (03/22/2024)   Epic    Worried About Programme Researcher, Broadcasting/film/video in the Last Year: Never true    Ran Out of Food in the Last Year: Never true  Transportation Needs: No Transportation Needs (03/22/2024)   Epic    Lack of Transportation (Medical): No    Lack of Transportation (Non-Medical): No  Physical Activity: Not on file  Stress: Not on file  Social Connections: Not on file  Intimate Partner Violence: Not At Risk (03/22/2024)   Epic    Fear of Current or Ex-Partner: No    Emotionally Abused: No    Physically Abused: No    Sexually Abused: No  Depression (PHQ2-9): Medium Risk (12/15/2023)   Depression (PHQ2-9)    PHQ-2 Score: 8  Alcohol Screen: Not on file  Housing: Low Risk (03/22/2024)   Epic    Unable to Pay for Housing in the Last Year: No    Number of Times Moved in the Last Year: 0    Homeless in the Last Year: No  Utilities: Not At Risk (03/22/2024)   Epic    Threatened with loss of utilities: No  Health Literacy: Not on file   Vitals BP 102/70   Pulse 63   Temp 98.4 F (36.9 C) (Oral)   Ht 5' 4 (1.626 m)   Wt 106 lb (48.1 kg)   SpO2 98%   BMI 18.19 kg/m    Examination  Gen: no acute distress HEENT:  [place]/AT, no scleral icterus, no pale conjunctivae, hearing normal, oral mucosa moist Neck: Supple, no lymphadenopathy  Cardio: Normal rate, s1s2 Resp: Pulmonary effort normal in room air, Normal breath sounds  GI: nondistended, soft  GU: Musc:  Extremities: No pedal edema Skin: No rashes Neuro: grossly non focal , awake, alert and oriented * 3  Psych: Calm, cooperative    Lab Results HIV 1 RNA Quant  Date Value  12/15/2023 <20 DETECTED copies/mL (A)  06/03/2023 Not Detected Copies/mL  12/23/2022 48 Copies/mL (H)   CD4 T Cell Abs (/uL)  Date Value  12/15/2023 207 (L)  06/03/2023 371 (L)  12/23/2022 361 (L)   No results found for: HIV1GENOSEQ Lab Results  Component Value Date   WBC 15.9 (H) 04/14/2024   HGB 9.6 (L) 04/14/2024   HCT 29.7 (L) 04/14/2024   MCV 97.1 04/14/2024   PLT 546 (H) 04/14/2024    Lab Results  Component Value Date   CREATININE 0.87 03/23/2024   BUN <5 (L) 03/23/2024   NA 137 03/23/2024   K 4.1 03/23/2024   CL 106 03/23/2024   CO2 22 03/23/2024   Lab Results  Component Value Date   ALT 8 03/23/2024   AST 18 03/23/2024   ALKPHOS 132 (H) 03/23/2024   BILITOT 0.4 03/23/2024    Lab Results  Component Value Date   CHOL 124 12/15/2023   TRIG 111 12/15/2023   HDL 37 (L) 12/15/2023   LDLCALC 68 12/15/2023   No results found for: HAV Lab Results  Component Value Date   HEPBSAG NEGATIVE 05/05/2014   HEPBSAB NON-REACTIVE 03/12/2022   Lab Results  Component Value Date   HCVAB NEGATIVE 05/05/2014   Lab Results  Component Value Date   CHLAMYDIAWP Negative 12/16/2023   N Negative 12/16/2023   Lab Results  Component Value Date   GCPROBEAPT NEGATIVE 04/27/2013   No results found for: QUANTGOLD  Imaging 11/26 CXR FINDINGS: Trachea is midline. Heart size normal. Lungs are clear. No pleural fluid.   IMPRESSION: Negative.  Health Maintenance: Immunization History  Administered Date(s) Administered   Dtap, Unspecified  06/02/1985   Hepatitis B 05/26/2006, 08/17/2007, 02/04/2010, 05/14/2010   Influenza Split 03/10/2011, 02/22/2012   Influenza Whole 05/26/2006, 03/06/2009, 02/04/2010   Influenza,inj,Quad PF,6+ Mos 03/21/2013   PFIZER Comirnaty(Gray Top)Covid-19 Tri-Sucrose Vaccine 11/19/2020   PPD Test 12/29/2006   Pneumococcal Polysaccharide-23 05/26/2006, 06/11/2011   Polio, Unspecified 06/02/1985   Rho (D) Immune Globulin  11/14/2010, 04/27/2013   Tdap 02/14/2023    Assessment/Plan: # Fevers  - unclear source. No symptoms or signs to suggest need to go to ED but advised to go to ED if persistent fevers, worsening symptoms  - will start with basic work up. No need for antibiotics  - fu in 2-3 weeks to review/additional work up   Orders Placed This Encounter  Procedures   Blood culture (routine single)   Blood culture (routine single)   HIV RNA, RTPCR W/R GT (RTI, PI,INT)   T-helper cells (CD4) count (not at [facility])   RPR W/RFLX TO RPR TITER, TREPONEMAL AB, SCREEN AND DIAGNOSIS   CBC   Comprehensive metabolic panel with GFR   C-reactive protein   Sedimentation rate   Mononucleosis screen   Epstein-Barr virus VCA antibody panel   CMV abs, IgG+IgM (cytomegalovirus)   Epstein-Barr virus nuclear antigen antibody, IgG   # HIV - adherence assessed - DDI reviewed  - side effects reviewed  - continue Biktarvy   - fu with Dr Fleeta Rothman   # STD screening  - Urine GC   # Hodgkins lymphoma - fu with Oncology   I personally spent a total of 35  minutes in the care of the patient today including preparing to see the  patient, getting/reviewing separately obtained history, performing a medically appropriate exam/evaluation, counseling and educating, placing orders, documenting clinical information in the EHR, independently interpreting results, and communicating results.  Electronically signed by:  Annalee Orem, MD Infectious Disease Physician [facility] [address] [place], [place] [address] Phone: [phone]  Fax: [phone]     [1]  Allergies Allergen Reactions   Temazepam  Other (See Comments)    Confusion/dizziness    "

## 2024-05-03 LAB — T-HELPER CELLS (CD4) COUNT (NOT AT ARMC)
CD4 % Helper T Cell: 32 % — ABNORMAL LOW (ref 33–65)
CD4 T Cell Abs: 183 /uL — ABNORMAL LOW (ref 400–1790)

## 2024-05-03 LAB — URINE CYTOLOGY ANCILLARY ONLY
Chlamydia: NEGATIVE
Comment: NEGATIVE
Comment: NORMAL
Neisseria Gonorrhea: NEGATIVE

## 2024-05-06 DIAGNOSIS — R509 Fever, unspecified: Secondary | ICD-10-CM | POA: Insufficient documentation

## 2024-05-07 ENCOUNTER — Emergency Department (HOSPITAL_COMMUNITY): Payer: Medicare (Managed Care)

## 2024-05-07 ENCOUNTER — Encounter (HOSPITAL_COMMUNITY): Payer: Self-pay | Admitting: Internal Medicine

## 2024-05-07 ENCOUNTER — Observation Stay (HOSPITAL_COMMUNITY): Payer: Medicare (Managed Care)

## 2024-05-07 ENCOUNTER — Other Ambulatory Visit: Payer: Self-pay

## 2024-05-07 ENCOUNTER — Observation Stay (HOSPITAL_COMMUNITY)
Admission: EM | Admit: 2024-05-07 | Discharge: 2024-05-09 | Disposition: A | Discharge status: Home / Self Care | Payer: Medicare (Managed Care) | Attending: Internal Medicine | Admitting: Internal Medicine

## 2024-05-07 DIAGNOSIS — Z79899 Other long term (current) drug therapy: Secondary | ICD-10-CM | POA: Insufficient documentation

## 2024-05-07 DIAGNOSIS — E440 Moderate protein-calorie malnutrition: Secondary | ICD-10-CM | POA: Insufficient documentation

## 2024-05-07 DIAGNOSIS — F17210 Nicotine dependence, cigarettes, uncomplicated: Secondary | ICD-10-CM | POA: Insufficient documentation

## 2024-05-07 DIAGNOSIS — C8110 Nodular sclerosis classical Hodgkin lymphoma, unspecified site: Secondary | ICD-10-CM | POA: Insufficient documentation

## 2024-05-07 DIAGNOSIS — D75839 Thrombocytosis, unspecified: Secondary | ICD-10-CM | POA: Insufficient documentation

## 2024-05-07 DIAGNOSIS — D649 Anemia, unspecified: Principal | ICD-10-CM | POA: Insufficient documentation

## 2024-05-07 DIAGNOSIS — Z9221 Personal history of antineoplastic chemotherapy: Secondary | ICD-10-CM | POA: Insufficient documentation

## 2024-05-07 DIAGNOSIS — R509 Fever, unspecified: Secondary | ICD-10-CM | POA: Diagnosis not present

## 2024-05-07 DIAGNOSIS — C8113 Nodular sclerosis classical Hodgkin lymphoma, intra-abdominal lymph nodes: Principal | ICD-10-CM | POA: Insufficient documentation

## 2024-05-07 DIAGNOSIS — B20 Human immunodeficiency virus [HIV] disease: Secondary | ICD-10-CM | POA: Insufficient documentation

## 2024-05-07 DIAGNOSIS — Z79624 Long term (current) use of inhibitors of nucleotide synthesis: Secondary | ICD-10-CM | POA: Insufficient documentation

## 2024-05-07 LAB — CBC WITH DIFFERENTIAL/PLATELET
Abs Immature Granulocytes: 0.1 K/uL — ABNORMAL HIGH (ref 0.00–0.07)
Basophils Absolute: 0 K/uL (ref 0.0–0.1)
Basophils Relative: 0 %
Eosinophils Absolute: 0.1 K/uL (ref 0.0–0.5)
Eosinophils Relative: 1 %
HCT: 18.4 % — ABNORMAL LOW (ref 36.0–46.0)
Hemoglobin: 6.7 g/dL — CL (ref 12.0–15.0)
Immature Granulocytes: 1 %
Lymphocytes Relative: 11 %
Lymphs Abs: 1.1 K/uL (ref 0.7–4.0)
MCH: 43.5 pg — ABNORMAL HIGH (ref 26.0–34.0)
MCHC: 36.4 g/dL — ABNORMAL HIGH (ref 30.0–36.0)
MCV: 119.5 fL — ABNORMAL HIGH (ref 80.0–100.0)
Monocytes Absolute: 1.2 K/uL — ABNORMAL HIGH (ref 0.1–1.0)
Monocytes Relative: 12 %
Neutro Abs: 7.8 K/uL — ABNORMAL HIGH (ref 1.7–7.7)
Neutrophils Relative %: 75 %
Platelets: 564 K/uL — ABNORMAL HIGH (ref 150–400)
RBC: 1.54 MIL/uL — ABNORMAL LOW (ref 3.87–5.11)
RDW: 25.9 % — ABNORMAL HIGH (ref 11.5–15.5)
WBC: 10.3 K/uL (ref 4.0–10.5)
nRBC: 0 % (ref 0.0–0.2)

## 2024-05-07 LAB — COMPREHENSIVE METABOLIC PANEL WITH GFR
AG Ratio: 0.6 (calc) — ABNORMAL LOW (ref 1.0–2.5)
ALT: 28 U/L (ref 6–29)
ALT: 22 U/L (ref 0–44)
AST: 20 U/L (ref 10–30)
AST: 25 U/L (ref 15–41)
Albumin: 3 g/dL — ABNORMAL LOW (ref 3.6–5.1)
Albumin: 3 g/dL — ABNORMAL LOW (ref 3.5–5.0)
Alkaline Phosphatase: 166 U/L — ABNORMAL HIGH (ref 38–126)
Alkaline phosphatase (APISO): 170 U/L — ABNORMAL HIGH (ref 31–125)
Anion gap: 10 (ref 5–15)
BUN: 10 mg/dL (ref 7–25)
BUN: 6 mg/dL (ref 6–20)
CO2: 25 mmol/L (ref 20–32)
CO2: 24 mmol/L (ref 22–32)
Calcium: 8.7 mg/dL (ref 8.6–10.2)
Calcium: 9.1 mg/dL (ref 8.9–10.3)
Chloride: 101 mmol/L (ref 98–110)
Chloride: 102 mmol/L (ref 98–111)
Creat: 0.8 mg/dL (ref 0.50–0.97)
Creatinine, Ser: 0.84 mg/dL (ref 0.44–1.00)
GFR, Estimated: >60 mL/min
Globulin: 4.7 g/dL — ABNORMAL HIGH (ref 1.9–3.7)
Glucose, Bld: 84 mg/dL (ref 65–99)
Glucose, Bld: 81 mg/dL (ref 70–99)
Potassium: 4.1 mmol/L (ref 3.5–5.3)
Potassium: 3.7 mmol/L (ref 3.5–5.1)
Sodium: 135 mmol/L (ref 135–146)
Sodium: 137 mmol/L (ref 135–145)
Total Bilirubin: 0.6 mg/dL (ref 0.2–1.2)
Total Bilirubin: 0.5 mg/dL (ref 0.0–1.2)
Total Protein: 7.7 g/dL (ref 6.1–8.1)
Total Protein: 8.5 g/dL — ABNORMAL HIGH (ref 6.5–8.1)
eGFR: 96 mL/min/1.73m2

## 2024-05-07 LAB — CBC
HCT: 21.5 % — ABNORMAL LOW (ref 35.9–46.0)
Hemoglobin: 7 g/dL — ABNORMAL LOW (ref 11.7–15.5)
MCH: 35.5 pg — ABNORMAL HIGH (ref 27.0–33.0)
MCHC: 32.6 g/dL (ref 31.6–35.4)
MCV: 109.1 fL — ABNORMAL HIGH (ref 81.4–101.7)
MPV: 9.9 fL (ref 7.5–12.5)
Platelets: 641 Thousand/uL — ABNORMAL HIGH (ref 140–400)
RBC: 1.97 Million/uL — ABNORMAL LOW (ref 3.80–5.10)
RDW: 20.5 % — ABNORMAL HIGH (ref 11.0–15.0)
WBC: 11.1 Thousand/uL — ABNORMAL HIGH (ref 3.8–10.8)

## 2024-05-07 LAB — CULTURE, BLOOD (SINGLE)
MICRO NUMBER:: 17432216
MICRO NUMBER:: 17432215
Result:: NO GROWTH
SPECIMEN QUALITY:: ADEQUATE

## 2024-05-07 LAB — I-STAT CG4 LACTIC ACID, ED
Lactic Acid, Venous: 0.9 mmol/L (ref 0.5–1.9)
Lactic Acid, Venous: 0.9 mmol/L (ref 0.5–1.9)

## 2024-05-07 LAB — CMV ABS, IGG+IGM (CYTOMEGALOVIRUS)
CMV IgM: <30 [AU]/ml
Cytomegalovirus Ab-IgG: >10 U/mL — ABNORMAL HIGH

## 2024-05-07 LAB — URINALYSIS, ROUTINE W REFLEX MICROSCOPIC
Bilirubin Urine: NEGATIVE
Glucose, UA: NEGATIVE mg/dL
Hgb urine dipstick: NEGATIVE
Ketones, ur: NEGATIVE mg/dL
Leukocytes,Ua: NEGATIVE
Nitrite: NEGATIVE
Protein, ur: NEGATIVE mg/dL
Specific Gravity, Urine: 1.013 (ref 1.005–1.030)
pH: 7 (ref 5.0–8.0)

## 2024-05-07 LAB — PROCALCITONIN: Procalcitonin: <0.1 ng/mL

## 2024-05-07 LAB — PREGNANCY, URINE: Preg Test, Ur: NEGATIVE

## 2024-05-07 LAB — HIV RNA, RTPCR W/R GT (RTI, PI,INT)
HIV 1 RNA Quant: <20 {copies}/mL — AB
HIV-1 RNA Quant, Log: <1.3 {Log_copies}/mL — AB

## 2024-05-07 LAB — RESP PANEL BY RT-PCR (RSV, FLU A&B, COVID)  RVPGX2
Influenza A by PCR: NEGATIVE
Influenza B by PCR: NEGATIVE
Resp Syncytial Virus by PCR: NEGATIVE
SARS Coronavirus 2 by RT PCR: NEGATIVE

## 2024-05-07 LAB — EPSTEIN-BARR VIRUS VCA ANTIBODY PANEL
EBV NA IgG: 103 U/mL — ABNORMAL HIGH
EBV VCA IgG: 166 U/mL — ABNORMAL HIGH
EBV VCA IgM: <36 U/mL

## 2024-05-07 LAB — MONONUCLEOSIS SCREEN: Heterophile, Mono Screen: NEGATIVE

## 2024-05-07 LAB — C-REACTIVE PROTEIN: CRP: 208 mg/L — ABNORMAL HIGH

## 2024-05-07 LAB — LIPASE, BLOOD: Lipase: 10 U/L — ABNORMAL LOW (ref 11–51)

## 2024-05-07 LAB — SEDIMENTATION RATE: Sed Rate: 77 mm/h — ABNORMAL HIGH (ref 0–20)

## 2024-05-07 LAB — SYPHILIS: RPR W/REFLEX TO RPR TITER AND TREPONEMAL ANTIBODIES, TRADITIONAL SCREENING AND DIAGNOSIS ALGORITHM: RPR Ser Ql: NONREACTIVE

## 2024-05-07 MED ORDER — SODIUM CHLORIDE 0.9% IV SOLUTION: Freq: Once | INTRAVENOUS | Status: DC

## 2024-05-07 MED ORDER — MIRTAZAPINE 7.5 MG PO TABS
15.0000 mg | ORAL_TABLET | Freq: Every day | ORAL | Status: DC
  Administered 2024-05-07 – 2024-05-08 (×2): 15 mg via ORAL
  Filled 2024-05-07: qty 2
  Filled 2024-05-07: qty 1

## 2024-05-07 MED ORDER — IOHEXOL 300 MG/ML  SOLN
100.0000 mL | Freq: Once | INTRAMUSCULAR | Status: AC | PRN
  Administered 2024-05-07: 100 mL via INTRAVENOUS

## 2024-05-07 MED ORDER — BICTEGRAVIR-EMTRICITAB-TENOFOV 50-200-25 MG PO TABS
1.0000 | ORAL_TABLET | Freq: Every day | ORAL | Status: DC
  Administered 2024-05-08 – 2024-05-09 (×2): 1 via ORAL
  Filled 2024-05-07 (×2): qty 1

## 2024-05-07 MED ORDER — TRAMADOL HCL 50 MG PO TABS: 100.0000 mg | ORAL_TABLET | Freq: Four times a day (QID) | ORAL | Status: DC | PRN

## 2024-05-07 MED ORDER — ACETAMINOPHEN 325 MG PO TABS
650.0000 mg | ORAL_TABLET | Freq: Four times a day (QID) | ORAL | Status: DC | PRN
  Administered 2024-05-08: 650 mg via ORAL
  Filled 2024-05-07: qty 2

## 2024-05-07 MED ORDER — ACETAMINOPHEN 650 MG RE SUPP: 650.0000 mg | Freq: Four times a day (QID) | RECTAL | Status: DC | PRN

## 2024-05-07 NOTE — ED Triage Notes (Signed)
 Patient reports productive cough/fever/ headache. Patient is alert and oriented x 4. Airway patent, respirations even and unlabored. Skin normal, warm and dry. Tylenol  last taken around 0715.

## 2024-05-07 NOTE — ED Notes (Signed)
 Pt returned from outside and smell of cannabis in room
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
## 2024-05-07 NOTE — ED Notes (Signed)
 Pt blood not ready, blood bank needs more time.

## 2024-05-07 NOTE — ED Notes (Signed)
 Pt walking outside to get some air.

## 2024-05-07 NOTE — ED Notes (Signed)
 Blood bank still working on blood.

## 2024-05-07 NOTE — H&P (Signed)
 " History and Physical    [name] FMW:[id number] DOB: 1985/11/04 DOA: 05/07/2024  Patient coming from: Home.  Chief Complaint: Fever.  HPI: [name] is a 39 y.o. female with history of HIV, Hodgkin's lymphoma status post chemo and immunotherapy in remission followed by Dr. Lonn oncologist presents to the ER with complaints of having persistent fever and chills with night sweats and weight loss ongoing for about a month and a half.  Recently followed with infectious disease consultant Dr. Dea.  Labs drawn at that time showed CRP of 208 sed rate of 77 and CD4 count of 183 HIV viral load undetectable and was instructed to come to the ER if patient had persistent fevers and chills.  Patient also has been feeling weak and dizzy at times.  Last few weeks patient also has been having some headache mostly in the left temporal area.  Denies any photophobia or neck pain.  ED Course: In the ER CT head was unremarkable.  CT chest abdomen pelvis shows features concerning for abdominal/pelvic lymphadenopathy concerning for recurrence of lymphoma.  Blood cultures were obtained.  Labs also show hemoglobin of 6.7.  2 units of PRBC was ordered.  Admitted for further observation.  Review of Systems: As per HPI, rest all negative.   Past Medical History:  Diagnosis Date   AIN III (anal intraepithelial neoplasia III)    Anemia    Cancer (HCC)    Hodgkin lymphoma   Chest wall pain 06/27/2015   Condyloma acuminatum in female    Depression    History of chronic bronchitis    History of esophagitis    CANDIDA   History of shingles    HIV (human immunodeficiency virus infection) (HCC)    Hodgkin's lymphoma (HCC) 06/12/2014   HSV (herpes simplex virus) infection    Hypokalemia 07/17/2014   Periodontitis, chronic    Screening examination for venereal disease 10/30/2013    Past Surgical History:  Procedure Laterality Date   DIAGNOSTIC LAPAROSCOPY WITH REMOVAL OF ECTOPIC PREGNANCY N/A  11/17/2015   Procedure: LAPAROSCOPY LEFT  SALPINGECTOMY SECONDARY TO LEFT ECTOPIC PREGNANCY;  Surgeon: Norleen LULLA Server, MD;  Location: [facility];  Service: Gynecology;  Laterality: N/A;   DILATION AND CURETTAGE OF UTERUS  2005   MISSED AB   EXAMINATION UNDER ANESTHESIA N/A 09/23/2012   Procedure: EXAM UNDER ANESTHESIA;  Surgeon: Elspeth KYM Schultze, MD;  Location: [facility];  Service: General;  Laterality: N/A;   IR GENERIC HISTORICAL  05/26/2016   IR FLUORO GUIDE PORT INSERTION RIGHT 05/26/2016 [facility]   IR GENERIC HISTORICAL  05/26/2016   IR US  GUIDE VASC ACCESS RIGHT 05/26/2016 [facility]   IR REMOVAL TUN ACCESS W/ PORT W/O FL MOD SED  09/19/2019   LASER ABLATION CONDOLAMATA N/A 09/23/2012   Procedure: REMOVAL/ABLATION  ABLATION CONDOLAMATA WARTS;  Surgeon: Elspeth KYM Schultze, MD;  [facility];  Service: General;  Laterality: N/A;     reports that she has been smoking cigars and cigarettes. She started smoking about 10 years ago. She has a 10.1 pack-year smoking history. She has never used smokeless tobacco. She reports current alcohol use. She reports current drug use. Drug: Marijuana.  Allergies[1]  Family History  Problem Relation Age of Onset   Cancer Maternal Aunt        lung   Cancer Maternal Grandmother        unknown ca    Prior to Admission medications  Medication Sig Start  Date End Date Taking? Authorizing Provider  bictegravir-emtricitabine -tenofovir  AF (BIKTARVY ) 50-200-25 MG TABS tablet Take 1 tablet by mouth daily. 12/15/23   Fleeta Kathie Jomarie LOISE, MD  Brimonidine Tartrate (LUMIFY) 0.025 % SOLN Place 1-2 drops into both eyes in the morning.    [provider]  desonide (DESOWEN) 0.05 % lotion Apply 1 Application topically 2 (two) times daily as needed (for irritation - affected area).    [provider]  Fluocinolone Acetonide 0.01 % OIL Apply 1 Application topically See admin instructions. Apply to the scalp once a  week as needed for dryness    [provider]  hydrocortisone  (ANUSOL -HC) 2.5 % rectal cream Place 1 Application rectally 2 (two) times daily.    [provider]  mirtazapine  (REMERON ) 15 MG tablet TAKE 1 TABLET(15 MG) BY MOUTH AT BEDTIME 03/27/24   Lonn Hicks, MD  mupirocin  cream (BACTROBAN ) 2 % Apply 1 Application topically 2 (two) times daily. 04/21/24   Myriam Dorn BROCKS, PA  omeprazole  (PRILOSEC) 40 MG capsule Take 1 capsule (40 mg total) by mouth daily. Patient not taking: Reported on 05/02/2024 12/15/23   Fleeta Kathie, Jomarie LOISE, MD  ondansetron  (ZOFRAN ) 4 MG tablet Take 4 mg by mouth every 8 (eight) hours as needed for nausea or vomiting.    [provider]  Pediatric Multivitamins-Iron (FLINTSTONES PLUS EXTRA IRON) 18 MG CHEW Chew 1 tablet by mouth in the morning and at bedtime.    [provider]  predniSONE  (DELTASONE ) 20 MG tablet Take 1 tablet (20 mg total) by mouth daily with breakfast. Patient not taking: Reported on 05/02/2024 04/03/24   Lonn Hicks, MD  traMADol  (ULTRAM ) 50 MG tablet Take 2 tablets (100 mg total) by mouth every 6 (six) hours as needed for severe pain (pain score 7-10). 04/25/24 05/06/24  Lonn Hicks, MD  TYLENOL  500 MG tablet Take 500-1,000 mg by mouth every 6 (six) hours as needed for mild pain (pain score 1-3) (or headaches).    [provider]    Physical Exam: Constitutional: Moderately built and nourished. Vitals:   05/07/24 1444 05/07/24 1931 05/07/24 2104  BP: 95/71 (!) 99/44 105/62  Pulse: (!) 109 97 96  Resp: 16 18 18   Temp: 98.9 F (37.2 C) 99.4 F (37.4 C) 99.2 F (37.3 C)  TempSrc: Oral Oral   SpO2: 100% 100% 100%   Eyes: Anicteric no pallor. ENMT: No discharge from the ears eyes nose or mouth. Neck: No mass felt.  No neck rigidity. Respiratory: No rhonchi or crepitations. Cardiovascular: S1-S2 heard. Abdomen: Soft nontender bowel sound present. Musculoskeletal: No edema. Skin: No rash. Neurologic:  Alert awake oriented to time place and person.  Moves all extremities. Psychiatric: Is normal.  Normal affect.   Labs on Admission: I have personally reviewed following labs and imaging studies  CBC: Recent Labs  Lab 05/02/24 1111 05/07/24 1823  WBC 11.1* 10.3  NEUTROABS  --  7.8*  HGB 7.0* 6.7*  HCT 21.5* 18.4*  MCV 109.1* 119.5*  PLT 641* 564*   Basic Metabolic Panel: Recent Labs  Lab 05/02/24 1111 05/07/24 1823  NA 135 137  K 4.1 3.7  CL 101 102  CO2 25 24  GLUCOSE 84 81  BUN 10 6  CREATININE 0.80 0.84  CALCIUM  8.7 9.1   GFR: Estimated Creatinine Clearance: 68.3 mL/min (by C-G formula based on SCr of 0.84 mg/dL). Liver Function Tests: Recent Labs  Lab 05/02/24 1111 05/07/24 1823  AST 20 25  ALT 28 22  ALKPHOS  --  166*  BILITOT 0.6 0.5  PROT 7.7 8.5*  ALBUMIN  --  3.0*   Recent Labs  Lab 05/07/24 1823  LIPASE 10*   No results for input(s): AMMONIA in the last 168 hours. Coagulation Profile: No results for input(s): INR, PROTIME in the last 168 hours. Cardiac Enzymes: No results for input(s): CKTOTAL, CKMB, CKMBINDEX, TROPONINI in the last 168 hours. BNP (last 3 results) No results for input(s): PROBNP in the last 8760 hours. HbA1C: No results for input(s): HGBA1C in the last 72 hours. CBG: No results for input(s): GLUCAP in the last 168 hours. Lipid Profile: No results for input(s): CHOL, HDL, LDLCALC, TRIG, CHOLHDL, LDLDIRECT in the last 72 hours. Thyroid  Function Tests: No results for input(s): TSH, T4TOTAL, FREET4, T3FREE, THYROIDAB in the last 72 hours. Anemia Panel: No results for input(s): VITAMINB12, FOLATE, FERRITIN, TIBC, IRON, RETICCTPCT in the last 72 hours. Urine analysis:    Component Value Date/Time   COLORURINE YELLOW 05/07/2024 1831   APPEARANCEUR CLEAR 05/07/2024 1831   LABSPEC 1.013 05/07/2024 1831   LABSPEC 1.005 12/02/2016 1050   PHURINE 7.0 05/07/2024 1831    GLUCOSEU NEGATIVE 05/07/2024 1831   GLUCOSEU Negative 12/02/2016 1050   HGBUR NEGATIVE 05/07/2024 1831   BILIRUBINUR NEGATIVE 05/07/2024 1831   BILIRUBINUR Negative 12/02/2016 1050   KETONESUR NEGATIVE 05/07/2024 1831   PROTEINUR NEGATIVE 05/07/2024 1831   UROBILINOGEN 0.2 03/17/2018 1155   UROBILINOGEN 0.2 12/02/2016 1050   NITRITE NEGATIVE 05/07/2024 1831   LEUKOCYTESUR NEGATIVE 05/07/2024 1831   LEUKOCYTESUR Negative 12/02/2016 1050   Sepsis Labs: @LABRCNTIP (procalcitonin:4,lacticidven:4) ) Recent Results (from the past 240 hours)  Blood culture (routine single)     Status: None (Preliminary result)   Collection Time: 05/02/24 11:11 AM   Specimen: Blood  Result Value Ref Range Status   MICRO NUMBER: [id number]  Preliminary   SPECIMEN QUALITY: Adequate  Preliminary   Source BLOOD 1  Preliminary   STATUS: PRELIMINARY  Preliminary   Result:   Preliminary    No growth to date. Culture is continuously monitored for a total of 120 hours incubation. A change in status will result in a phone report followed by an updated printed culture report.   COMMENT: Aerobic and anaerobic bottle received.  Preliminary  Blood culture (routine single)     Status: None (Preliminary result)   Collection Time: 05/02/24 11:30 AM   Specimen: Blood  Result Value Ref Range Status   MICRO NUMBER: [id number]  Preliminary   SPECIMEN QUALITY: Suboptimal  Preliminary   Source BLOOD 2  Preliminary   STATUS: PRELIMINARY  Preliminary   Result:   Preliminary    No growth to date. Culture is continuously monitored for a total of 120 hours incubation. A change in status will result in a phone report followed by an updated printed culture report. Inspection of blood culture bottles indicates that an inadequate  volume of blood may have been collected for the detection of sepsis.    COMMENT: Aerobic and anaerobic bottle received.  Preliminary  Resp panel by RT-PCR (RSV, Flu A&B, Covid) Anterior Nasal Swab     Status:  None   Collection Time: 05/07/24  4:54 PM   Specimen: Anterior Nasal Swab  Result Value Ref Range Status   SARS Coronavirus 2 by RT PCR NEGATIVE NEGATIVE Final    Comment: (NOTE) SARS-CoV-2 target nucleic acids are NOT DETECTED.  The SARS-CoV-2 RNA is generally detectable in upper respiratory specimens during the acute phase of infection. The  lowest concentration of SARS-CoV-2 viral copies this assay can detect is 138 copies/mL. A negative result does not preclude SARS-Cov-2 infection and should not be used as the sole basis for treatment or other patient management decisions. A negative result may occur with  improper specimen collection/handling, submission of specimen other than nasopharyngeal swab, presence of viral mutation(s) within the areas targeted by this assay, and inadequate number of viral copies(<138 copies/mL). A negative result must be combined with clinical observations, patient history, and epidemiological information. The expected result is Negative.  Fact Sheet for Patients:  bloggercourse.com  Fact Sheet for Healthcare Providers:  seriousbroker.it  This test is no t yet approved or cleared by the [place]  FDA and  has been authorized for detection and/or diagnosis of SARS-CoV-2 by FDA under an Emergency Use Authorization (EUA). This EUA will remain  in effect (meaning this test can be used) for the duration of the COVID-19 declaration under Section 564(b)(1) of the Act, 21 U.S.C.section 360bbb-3(b)(1), unless the authorization is terminated  or revoked sooner.       Influenza A by PCR NEGATIVE NEGATIVE Final   Influenza B by PCR NEGATIVE NEGATIVE Final    Comment: (NOTE) The Xpert Xpress SARS-CoV-2/FLU/RSV plus assay is intended as an aid in the diagnosis of influenza from Nasopharyngeal swab specimens and should not be used as a sole basis for treatment. Nasal washings and aspirates are unacceptable  for Xpert Xpress SARS-CoV-2/FLU/RSV testing.  Fact Sheet for Patients: bloggercourse.com  Fact Sheet for Healthcare Providers: seriousbroker.it  This test is not yet approved or cleared by the [place]  FDA and has been authorized for detection and/or diagnosis of SARS-CoV-2 by FDA under an Emergency Use Authorization (EUA). This EUA will remain in effect (meaning this test can be used) for the duration of the COVID-19 declaration under Section 564(b)(1) of the Act, 21 U.S.C. section 360bbb-3(b)(1), unless the authorization is terminated or revoked.     Resp Syncytial Virus by PCR NEGATIVE NEGATIVE Final    Comment: (NOTE) Fact Sheet for Patients: bloggercourse.com  Fact Sheet for Healthcare Providers: seriousbroker.it  This test is not yet approved or cleared by the [place]  FDA and has been authorized for detection and/or diagnosis of SARS-CoV-2 by FDA under an Emergency Use Authorization (EUA). This EUA will remain in effect (meaning this test can be used) for the duration of the COVID-19 declaration under Section 564(b)(1) of the Act, 21 U.S.C. section 360bbb-3(b)(1), unless the authorization is terminated or revoked.  Performed at [facility], [address] [place], [place] [address]      Radiological Exams on Admission: CT CHEST ABDOMEN PELVIS W CONTRAST Result Date: 05/07/2024 EXAM: CT CHEST, ABDOMEN AND PELVIS WITH CONTRAST 05/07/2024 07:17:33 PM TECHNIQUE: CT of the chest, abdomen and pelvis was performed with the administration of 100 mL of iohexol  (OMNIPAQUE ) 300 MG/ML solution. Multiplanar reformatted images are provided for review. Automated exposure control, iterative reconstruction, and/or weight based adjustment of the mA/kV was utilized to reduce the radiation dose to as low as reasonably achievable. COMPARISON: 12/05/2019 CLINICAL  HISTORY: Sepsis. Additional history of Hodgkin lymphoma on the prior. FINDINGS: CHEST: MEDIASTINUM AND LYMPH NODES: Heart and pericardium are unremarkable. The central airways are clear. No mediastinal, hilar or axillary lymphadenopathy. LUNGS AND PLEURA: Mild paraseptal emphysematous changes at the lung apices. No focal consolidation or pulmonary edema. No pleural effusion. No pneumothorax. ABDOMEN AND PELVIS: LIVER: Unremarkable. GALLBLADDER AND BILE DUCTS: Unremarkable. No biliary ductal dilatation. SPLEEN: Spleen is normal in  size. PANCREAS: No acute abnormality. ADRENAL GLANDS: No acute abnormality. KIDNEYS, URETERS AND BLADDER: No stones in the kidneys or ureters. No hydronephrosis. No perinephric or periureteral stranding. Urinary bladder is unremarkable. GI AND BOWEL: Stomach demonstrates no acute abnormality. There is no bowel obstruction. REPRODUCTIVE ORGANS: Uterus is within normal limits. PERITONEUM AND RETROPERITONEUM: No ascites. No free air. VASCULATURE: Aorta is normal in caliber. ABDOMINAL AND PELVIS LYMPH NODES: Abdominopelvic lymphadenopathy, including: 1.4 cm right retrocrural node (image 57), 3.6 cm mesenteric node in the right upper abdomen (image 82), 2.2 cm short axis right paraaortic node (image 80), 2.2 cm short axis left common iliac node (image 89), 2.8 cm right internal iliac node (image 98). BONES AND SOFT TISSUES: No acute osseous abnormality. No focal soft tissue abnormality. IMPRESSION: 1. Abdominopelvic lymphadenopathy, as above, suggesting recurrent lymphoma. 2. Spleen is normal in size. Electronically signed by: Pinkie Pebbles MD MD 05/07/2024 07:34 PM EST RP Workstation: [id number]   DG Chest 2 View Result Date: 05/07/2024 CLINICAL DATA:  Productive cough, fever, headache EXAM: CHEST - 2 VIEW COMPARISON:  03/22/2024 FINDINGS: Frontal and lateral views of the chest demonstrate an unremarkable cardiac silhouette. No airspace disease, effusion, or pneumothorax. No acute bony  abnormalities. IMPRESSION: 1. No acute intrathoracic process. Electronically Signed   By: Ozell Daring M.D.   On: 05/07/2024 16:07      Assessment/Plan Principal Problem:   Fever and chills Active Problems:   Human immunodeficiency virus (HIV) disease (HCC)   Hodgkin lymphoma, nodular sclerosis (HCC)   Symptomatic anemia    Fevers with CT scan showing abdominal pelvic lymphadenopathy suggesting recurrence of lymphoma.  Not sure if the fevers are from recurrence of lymphoma.  Will consult patient's oncologist Dr. Lonn.  Follow cultures. Symptomatic anemia hemoglobin was around 6.7 and patient's hemoglobin is usually around 7-8.  PRBC transfusion has been ordered.  Follow CBC. HIV on Biktarvy .  CD4 count done last week was 183 with undetectable viral load. History of Hodgkin's lymphoma see #1.  DVT prophylaxis: SCDs. Code Status: Full code. Family Communication: Family at the bedside. Disposition Plan: Medical floor. Consults called: Will consult oncologist. Admission status: Observation.         [1]  Allergies Allergen Reactions   Temazepam  Other (See Comments)    Confusion/dizziness    "

## 2024-05-07 NOTE — ED Notes (Addendum)
 Pt walked up to the nurses desk angrily. Pt slamming doors after asking for food. Pt states that she has not ate all day.

## 2024-05-08 ENCOUNTER — Ambulatory Visit: Payer: Self-pay | Admitting: Infectious Diseases

## 2024-05-08 DIAGNOSIS — R509 Fever, unspecified: Secondary | ICD-10-CM

## 2024-05-08 DIAGNOSIS — D649 Anemia, unspecified: Secondary | ICD-10-CM

## 2024-05-08 DIAGNOSIS — C8118 Nodular sclerosis classical Hodgkin lymphoma, lymph nodes of multiple sites: Secondary | ICD-10-CM

## 2024-05-08 DIAGNOSIS — B20 Human immunodeficiency virus [HIV] disease: Secondary | ICD-10-CM | POA: Diagnosis not present

## 2024-05-08 LAB — CBC
HCT: 19 % — ABNORMAL LOW (ref 36.0–46.0)
Hemoglobin: 6.2 g/dL — CL (ref 12.0–15.0)
MCH: 38 pg — ABNORMAL HIGH (ref 26.0–34.0)
MCHC: 32.6 g/dL (ref 30.0–36.0)
MCV: 116.6 fL — ABNORMAL HIGH (ref 80.0–100.0)
Platelets: 545 K/uL — ABNORMAL HIGH (ref 150–400)
RBC: 1.63 MIL/uL — ABNORMAL LOW (ref 3.87–5.11)
RDW: 23.6 % — ABNORMAL HIGH (ref 11.5–15.5)
WBC: 9.3 K/uL (ref 4.0–10.5)
nRBC: 0 % (ref 0.0–0.2)

## 2024-05-08 LAB — COMPREHENSIVE METABOLIC PANEL WITH GFR
ALT: 21 U/L (ref 0–44)
AST: 23 U/L (ref 15–41)
Albumin: 2.7 g/dL — ABNORMAL LOW (ref 3.5–5.0)
Alkaline Phosphatase: 161 U/L — ABNORMAL HIGH (ref 38–126)
Anion gap: 9 (ref 5–15)
BUN: 9 mg/dL (ref 6–20)
CO2: 24 mmol/L (ref 22–32)
Calcium: 8.9 mg/dL (ref 8.9–10.3)
Chloride: 102 mmol/L (ref 98–111)
Creatinine, Ser: 0.94 mg/dL (ref 0.44–1.00)
GFR, Estimated: >60 mL/min
Glucose, Bld: 115 mg/dL — ABNORMAL HIGH (ref 70–99)
Potassium: 3.6 mmol/L (ref 3.5–5.1)
Sodium: 135 mmol/L (ref 135–145)
Total Bilirubin: 0.5 mg/dL (ref 0.0–1.2)
Total Protein: 7.8 g/dL (ref 6.5–8.1)

## 2024-05-08 LAB — PREPARE RBC (CROSSMATCH)

## 2024-05-08 NOTE — ED Notes (Signed)
 Pt sleeping no signs of distress

## 2024-05-08 NOTE — Progress Notes (Signed)
 [name]   DOB:10/19/1985   FM#:[id number]    ASSESSMENT & PLAN:  History of recurrent nodular sclerosing Hodgkin lymphoma The patient was diagnosed with Hodgkin lymphoma in 2016 in the setting of HIV disease Treatment course was complicated by severe anemia, noncompliance with appointment etc. 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, CD30 positive She has complete response with no signs of recurrent disease since 2019 CT imaging from yesterday was reviewed by myself independently which showed abdominal lymphadenopathy, worrisome for recurrent lymphoma I discussed with the patient the rationale behind biopsy I will order CT-guided biopsy of the abdominal mass I will order outpatient PET/CT imaging to complete staging after discharge  Severe anemia This is worrisome for possible bone marrow involvement I will reorder anemia panel tomorrow I discussed with the patient the rationale behind ordering both abdominal mass biopsy and CT-guided bone marrow aspirate and biopsy and she agreed to proceed  Discharge planning She will likely be here for the next 2 to 3 days to complete workup and to receive inpatient transfusion I will see her tomorrow again   Allison Bedford, MD 05/08/2024 11:24 AM  Subjective:  Patient well-known to me.  She have history of recurrent Hodgkin lymphoma status postchemotherapy.  She completed chemotherapy with no signs of cancer since 2019 She is now admitted to the hospital due to severe anemia and recurrent fever and chills CT imaging unfortunately shows signs of abdominal lymphadenopathy, worrisome for recurrent lymphoma She is receiving blood transfusion when I saw the patient in the emergency room She  denies recent bleeding I reviewed test results and discussed plan of care with the patient  Objective:  Vitals:   05/08/24 1054 05/08/24 1114  BP: 95/65 96/64  Pulse: 99 91  Resp: 18 18  Temp: 99.6 F (37.6 C) 99.9 F (37.7 C)  SpO2: 98% 100%    No intake or output data in the 24 hours ending 05/08/24 1124

## 2024-05-08 NOTE — ED Notes (Signed)
 Blood bank still waiting on blood.

## 2024-05-08 NOTE — Progress Notes (Addendum)
 " Progress Note   Patient: [name] FMW:[id number] DOB: October 21, 1985 DOA: 05/07/2024     0 DOS: the patient was seen and examined on 05/08/2024   Brief hospital course: [name] is a 39 y.o. female with history of HIV, Hodgkin's lymphoma status post chemo and immunotherapy in remission followed by Dr. Lonn oncologist presents to the ER with complaints of having persistent fever and chills with night sweats and weight loss ongoing for about a month and a half.  Laboratory findings showed severe anemia hemoglobin 6.2.  CT abdomen pelvis showed findings worrisome for recurrent lymphoma.  Admitted to hospitalist service for evaluation of fevers, possible recurrence of lymphoma, symptomatic anemia  Assessment and Plan: Recurrent fevers- Prior history of Hodgkin's lymphoma CT scan showing abdominal pelvic lymphadenopathy suggesting recurrence of lymphoma. Oncologist consulted who recommended CT-guided lymph node biopsy. Will hold off on IV antibiotics at this time.  Monitor fever curve. Follow cultures.  Severe anemia: Hemoglobin 6.2 this morning.  She denies any active bleeding.  Unable panel ordered. 2 units of blood transfusion were ordered which was delayed due to presence of multiple antibodies. Posttransfusion H&H ordered.  HIV- CD4 count last week 183, viral load undetectable. Continue Biktarvy  therapy.  Moderate malnutrition: BMI 18.19. RD consult, encourage oral diet, supplements.     Out of bed to chair. Incentive spirometry. Nursing supportive care. Fall, aspiration precautions. Diet:  Diet Orders (From admission, onward)     Start     Ordered   05/09/24 0001  Diet NPO time specified Except for: Sips with Meds  Diet effective midnight       Comments:    Question:  Except for  Answer:  Noralyn with Meds   05/08/24 1337   05/07/24 2143  Diet regular Room service appropriate? Yes; Fluid consistency: Thin  Diet effective now       Question Answer Comment  Room  service appropriate? Yes   Fluid consistency: Thin      05/07/24 2143           DVT prophylaxis: SCDs Start: 05/07/24 2143  Level of care: Med-Surg   Code Status: Full Code  Subjective: Patient is seen and examined today morning.  She is lying comfortably, denies abdominal discomfort, nausea or vomiting.  Wishes to go home.  Advised to stay for blood transfusion and oncology evaluation.  She agreed.  Physical Exam: Vitals:   05/08/24 0623 05/08/24 1054 05/08/24 1114 05/08/24 1406  BP: (!) 95/57 95/65 96/64  92/64  Pulse: 80 99 91 86  Resp: 18 18 18 18   Temp: 99.2 F (37.3 C) 99.6 F (37.6 C) 99.9 F (37.7 C) 99.3 F (37.4 C)  TempSrc: Oral Oral Oral Oral  SpO2: 100% 98% 100% 99%    General - Young thin built African-American female, no apparent distress HEENT - PERRLA, EOMI, atraumatic head, non tender sinuses. Lung - Clear,  no rales, rhonchi, wheezes. Heart - S1, S2 heard, no murmurs, rubs, no pedal edema. Abdomen - Soft, non tender, bowel sounds good Neuro - Alert, awake and oriented x 3, non focal exam. Skin - Warm and dry.  Data Reviewed:      Latest Ref Rng & Units 05/08/2024    5:10 AM 05/07/2024    6:23 PM 05/02/2024   11:11 AM  CBC  WBC 4.0 - 10.5 K/uL 9.3  10.3  11.1   Hemoglobin 12.0 - 15.0 g/dL 6.2  6.7  7.0   Hematocrit 36.0 - 46.0 % 19.0  18.4  21.5   Platelets 150 - 400 K/uL 545  564  641       Latest Ref Rng & Units 05/08/2024    5:10 AM 05/07/2024    6:23 PM 05/02/2024   11:11 AM  BMP  Glucose 70 - 99 mg/dL 884  81  84   BUN 6 - 20 mg/dL 9  6  10    Creatinine 0.44 - 1.00 mg/dL 9.05  9.15  9.19   BUN/Creat Ratio 6 - 22 (calc)   SEE NOTE:   Sodium 135 - 145 mmol/L 135  137  135   Potassium 3.5 - 5.1 mmol/L 3.6  3.7  4.1   Chloride 98 - 111 mmol/L 102  102  101   CO2 22 - 32 mmol/L 24  24  25    Calcium  8.9 - 10.3 mg/dL 8.9  9.1  8.7    CT HEAD WO CONTRAST ( ) Result Date: 05/07/2024 EXAM: CT HEAD WITHOUT CONTRAST 05/07/2024 10:08:21 PM  TECHNIQUE: CT of the head was performed without the administration of intravenous contrast. Automated exposure control, iterative reconstruction, and/or weight based adjustment of the mA/kV was utilized to reduce the radiation dose to as low as reasonably achievable. COMPARISON: CT head 3:14:6 CLINICAL HISTORY: Headache, sudden, severe. FINDINGS: BRAIN AND VENTRICLES: No acute hemorrhage. No evidence of acute infarct. No hydrocephalus. No extra-axial collection. No mass effect or midline shift. ORBITS: No acute abnormality. SINUSES: Mucosal thickening of the bilateral sphenoid and ethmoid sinuses. SOFT TISSUES AND SKULL: No acute soft tissue abnormality. No skull fracture. Mastoid air cells are clear. IMPRESSION: 1. No acute intracranial abnormality. 2. Mucosal thickening of the bilateral sphenoid and ethmoid sinuses. Electronically signed by: Morgane Naveau MD MD 05/07/2024 10:12 PM EST RP Workstation: [id number]   CT CHEST ABDOMEN PELVIS W CONTRAST Result Date: 05/07/2024 EXAM: CT CHEST, ABDOMEN AND PELVIS WITH CONTRAST 05/07/2024 07:17:33 PM TECHNIQUE: CT of the chest, abdomen and pelvis was performed with the administration of 100 mL of iohexol  (OMNIPAQUE ) 300 MG/ML solution. Multiplanar reformatted images are provided for review. Automated exposure control, iterative reconstruction, and/or weight based adjustment of the mA/kV was utilized to reduce the radiation dose to as low as reasonably achievable. COMPARISON: 12/05/2019 CLINICAL HISTORY: Sepsis. Additional history of Hodgkin lymphoma on the prior. FINDINGS: CHEST: MEDIASTINUM AND LYMPH NODES: Heart and pericardium are unremarkable. The central airways are clear. No mediastinal, hilar or axillary lymphadenopathy. LUNGS AND PLEURA: Mild paraseptal emphysematous changes at the lung apices. No focal consolidation or pulmonary edema. No pleural effusion. No pneumothorax. ABDOMEN AND PELVIS: LIVER: Unremarkable. GALLBLADDER AND BILE DUCTS: Unremarkable. No  biliary ductal dilatation. SPLEEN: Spleen is normal in size. PANCREAS: No acute abnormality. ADRENAL GLANDS: No acute abnormality. KIDNEYS, URETERS AND BLADDER: No stones in the kidneys or ureters. No hydronephrosis. No perinephric or periureteral stranding. Urinary bladder is unremarkable. GI AND BOWEL: Stomach demonstrates no acute abnormality. There is no bowel obstruction. REPRODUCTIVE ORGANS: Uterus is within normal limits. PERITONEUM AND RETROPERITONEUM: No ascites. No free air. VASCULATURE: Aorta is normal in caliber. ABDOMINAL AND PELVIS LYMPH NODES: Abdominopelvic lymphadenopathy, including: 1.4 cm right retrocrural node (image 57), 3.6 cm mesenteric node in the right upper abdomen (image 82), 2.2 cm short axis right paraaortic node (image 80), 2.2 cm short axis left common iliac node (image 89), 2.8 cm right internal iliac node (image 98). BONES AND SOFT TISSUES: No acute osseous abnormality. No focal soft tissue abnormality. IMPRESSION: 1. Abdominopelvic lymphadenopathy, as above, suggesting recurrent lymphoma. 2. Spleen is  normal in size. Electronically signed by: Pinkie Pebbles MD MD 05/07/2024 07:34 PM EST RP Workstation: [id number]   DG Chest 2 View Result Date: 05/07/2024 CLINICAL DATA:  Productive cough, fever, headache EXAM: CHEST - 2 VIEW COMPARISON:  03/22/2024 FINDINGS: Frontal and lateral views of the chest demonstrate an unremarkable cardiac silhouette. No airspace disease, effusion, or pneumothorax. No acute bony abnormalities. IMPRESSION: 1. No acute intrathoracic process. Electronically Signed   By: Ozell Daring M.D.   On: 05/07/2024 16:07    Family Communication: Discussed with patient, family at bedside.  They understand and agree. All questions answered.  Disposition: Status is: Observation The patient remains OBS appropriate and will d/c before 2 midnights.  Planned Discharge Destination: Home     Time spent: 51 minutes  Author: Concepcion Riser, MD 05/08/2024  4:00 PM Secure chat 7am to 7pm For on call review [email or web address]    "

## 2024-05-08 NOTE — Progress Notes (Signed)
 "   Referring Physician(s): Gorsuch,N  Supervising Physician: Hughes Simmonds  Patient Status:  [facility] - In-pt  Chief Complaint: Prior history of nodular sclerosing Hodgkin's lymphoma,HIV;  now with abdominal lymphadenopathy, anemia, recurrent fever/chills concerning for recurrent lymphoma; referred for image guided bone marrow biopsy and retroperitoneal lymph node biopsy   Subjective: Patient known to IR team from right cervical lymph node biopsy in 2016, Port-A-Cath placement 2016, bone marrow biopsy and right iliac bone lesion biopsy in 2017, Port-A-Cath removal 2017, retroperitoneal lymph node biopsy 2018, new Port-A-Cath placement 2018 with subsequent removal in 2021.  She is a 39 year old female smoker with past medical history significant for depression, chronic bronchitis, esophagitis, HIV, HSV, and nodular sclerosing Hodgkin's lymphoma initially diagnosed 2016 with subsequent chemotherapy.  Patient had relapse in 2018 and completed chemotherapy with no signs of cancer since 2019.  She is now admitted with severe anemia, recurrent fever, chills and CT scan showing signs of abdominal lymphadenopathy worrisome for recurrent lymphoma.  She has been transfused.  Patient was seen by oncology and request now received for image guided bone marrow biopsy along with retroperitoneal lymph node biopsy.  Patient denies headache, chest pain, worsening dyspnea, vomiting or visible bleeding.  She has had occasional cough, intermittent abdominal/back discomfort, intermittent nausea.   Past Medical History:  Diagnosis Date   AIN III (anal intraepithelial neoplasia III)    Anemia    Cancer (HCC)    Hodgkin lymphoma   Chest wall pain 06/27/2015   Condyloma acuminatum in female    Depression    History of chronic bronchitis    History of esophagitis    CANDIDA   History of shingles    HIV (human immunodeficiency virus infection) (HCC)    Hodgkin's lymphoma (HCC) 06/12/2014   HSV (herpes simplex virus)  infection    Hypokalemia 07/17/2014   Periodontitis, chronic    Screening examination for venereal disease 10/30/2013   Past Surgical History:  Procedure Laterality Date   DIAGNOSTIC LAPAROSCOPY WITH REMOVAL OF ECTOPIC PREGNANCY N/A 11/17/2015   Procedure: LAPAROSCOPY LEFT  SALPINGECTOMY SECONDARY TO LEFT ECTOPIC PREGNANCY;  Surgeon: Norleen LULLA Server, MD;  Location: [facility];  Service: Gynecology;  Laterality: N/A;   DILATION AND CURETTAGE OF UTERUS  2005   MISSED AB   EXAMINATION UNDER ANESTHESIA N/A 09/23/2012   Procedure: EXAM UNDER ANESTHESIA;  Surgeon: Elspeth KYM Schultze, MD;  Location: [facility];  Service: General;  Laterality: N/A;   IR GENERIC HISTORICAL  05/26/2016   IR FLUORO GUIDE PORT INSERTION RIGHT 05/26/2016 [facility]   IR GENERIC HISTORICAL  05/26/2016   IR US  GUIDE VASC ACCESS RIGHT 05/26/2016 [facility]   IR REMOVAL TUN ACCESS W/ PORT W/O FL MOD SED  09/19/2019   LASER ABLATION CONDOLAMATA N/A 09/23/2012   Procedure: REMOVAL/ABLATION  ABLATION CONDOLAMATA WARTS;  Surgeon: Elspeth KYM Schultze, MD;  Location: [facility];  Service: General;  Laterality: N/A;      Allergies: Temazepam   Medications: Prior to Admission medications  Medication Sig Start Date End Date Taking? Authorizing Provider  bictegravir-emtricitabine -tenofovir  AF (BIKTARVY ) 50-200-25 MG TABS tablet Take 1 tablet by mouth daily. 12/15/23  Yes Fleeta Rothman, Jomarie SAILOR, MD  hydrocortisone  (ANUSOL -HC) 2.5 % rectal cream Place 1 Application rectally 2 (two) times daily.   Yes [provider]  mirtazapine  (REMERON ) 15 MG tablet TAKE 1 TABLET(15 MG) BY MOUTH AT BEDTIME 03/27/24  Yes Gorsuch, Ni, MD  mupirocin  cream (BACTROBAN ) 2 % Apply 1 Application topically 2 (  two) times daily. 04/21/24  Yes Myriam Dorn BROCKS, PA  ondansetron  (ZOFRAN ) 4 MG tablet Take 4 mg by mouth every 8 (eight) hours as needed for nausea or vomiting.   Yes [provider]  traMADol  (ULTRAM ) 50 MG  tablet Take 2 tablets (100 mg total) by mouth every 6 (six) hours as needed for severe pain (pain score 7-10). 04/25/24 05/07/24 Yes Lonn Hicks, MD  TYLENOL  500 MG tablet Take 500-1,000 mg by mouth every 6 (six) hours as needed for mild pain (pain score 1-3) (or headaches).   Yes [provider]  Brimonidine Tartrate (LUMIFY) 0.025 % SOLN Place 1-2 drops into both eyes in the morning. Patient not taking: Reported on 05/07/2024    [provider]  desonide (DESOWEN) 0.05 % lotion Apply 1 Application topically 2 (two) times daily as needed (for irritation - affected area). Patient not taking: Reported on 05/07/2024    [provider]  Fluocinolone Acetonide 0.01 % OIL Apply 1 Application topically See admin instructions. Apply to the scalp once a week as needed for dryness Patient not taking: Reported on 05/07/2024    [provider]  omeprazole  (PRILOSEC) 40 MG capsule Take 1 capsule (40 mg total) by mouth daily. Patient not taking: Reported on 03/22/2024 12/15/23   Fleeta Rothman, Jomarie SAILOR, MD  Pediatric Multivitamins-Iron (FLINTSTONES PLUS EXTRA IRON) 18 MG CHEW Chew 1 tablet by mouth in the morning and at bedtime. Patient not taking: Reported on 05/07/2024    [provider]  predniSONE  (DELTASONE ) 20 MG tablet Take 1 tablet (20 mg total) by mouth daily with breakfast. Patient not taking: No sig reported 04/03/24   Lonn Hicks, MD     Vital Signs: BP 92/64   Pulse 86   Temp 99.3 F (37.4 C) (Oral)   Resp 18   LMP 04/12/2024   SpO2 99%   Physical Exam: Awake, alert.  Chest clear to auscultation bilaterally.  Heart with regular rate and rhythm.  Abdomen soft, positive bowel sounds, some mild generalized tenderness to palpation.  No lower extremity edema.  Imaging: CT HEAD WO CONTRAST ( ) Result Date: 05/07/2024 EXAM: CT HEAD WITHOUT CONTRAST 05/07/2024 10:08:21 PM TECHNIQUE: CT of the head was performed without the administration of intravenous  contrast. Automated exposure control, iterative reconstruction, and/or weight based adjustment of the mA/kV was utilized to reduce the radiation dose to as low as reasonably achievable. COMPARISON: CT head 3:14:6 CLINICAL HISTORY: Headache, sudden, severe. FINDINGS: BRAIN AND VENTRICLES: No acute hemorrhage. No evidence of acute infarct. No hydrocephalus. No extra-axial collection. No mass effect or midline shift. ORBITS: No acute abnormality. SINUSES: Mucosal thickening of the bilateral sphenoid and ethmoid sinuses. SOFT TISSUES AND SKULL: No acute soft tissue abnormality. No skull fracture. Mastoid air cells are clear. IMPRESSION: 1. No acute intracranial abnormality. 2. Mucosal thickening of the bilateral sphenoid and ethmoid sinuses. Electronically signed by: Morgane Naveau MD MD 05/07/2024 10:12 PM EST RP Workstation: [id number]   CT CHEST ABDOMEN PELVIS W CONTRAST Result Date: 05/07/2024 EXAM: CT CHEST, ABDOMEN AND PELVIS WITH CONTRAST 05/07/2024 07:17:33 PM TECHNIQUE: CT of the chest, abdomen and pelvis was performed with the administration of 100 mL of iohexol  (OMNIPAQUE ) 300 MG/ML solution. Multiplanar reformatted images are provided for review. Automated exposure control, iterative reconstruction, and/or weight based adjustment of the mA/kV was utilized to reduce the radiation dose to as low as reasonably achievable. COMPARISON: 12/05/2019 CLINICAL HISTORY: Sepsis. Additional history of Hodgkin lymphoma on the prior. FINDINGS: CHEST: MEDIASTINUM AND LYMPH  NODES: Heart and pericardium are unremarkable. The central airways are clear. No mediastinal, hilar or axillary lymphadenopathy. LUNGS AND PLEURA: Mild paraseptal emphysematous changes at the lung apices. No focal consolidation or pulmonary edema. No pleural effusion. No pneumothorax. ABDOMEN AND PELVIS: LIVER: Unremarkable. GALLBLADDER AND BILE DUCTS: Unremarkable. No biliary ductal dilatation. SPLEEN: Spleen is normal in size. PANCREAS: No acute  abnormality. ADRENAL GLANDS: No acute abnormality. KIDNEYS, URETERS AND BLADDER: No stones in the kidneys or ureters. No hydronephrosis. No perinephric or periureteral stranding. Urinary bladder is unremarkable. GI AND BOWEL: Stomach demonstrates no acute abnormality. There is no bowel obstruction. REPRODUCTIVE ORGANS: Uterus is within normal limits. PERITONEUM AND RETROPERITONEUM: No ascites. No free air. VASCULATURE: Aorta is normal in caliber. ABDOMINAL AND PELVIS LYMPH NODES: Abdominopelvic lymphadenopathy, including: 1.4 cm right retrocrural node (image 57), 3.6 cm mesenteric node in the right upper abdomen (image 82), 2.2 cm short axis right paraaortic node (image 80), 2.2 cm short axis left common iliac node (image 89), 2.8 cm right internal iliac node (image 98). BONES AND SOFT TISSUES: No acute osseous abnormality. No focal soft tissue abnormality. IMPRESSION: 1. Abdominopelvic lymphadenopathy, as above, suggesting recurrent lymphoma. 2. Spleen is normal in size. Electronically signed by: Pinkie Pebbles MD MD 05/07/2024 07:34 PM EST RP Workstation: [id number]   DG Chest 2 View Result Date: 05/07/2024 CLINICAL DATA:  Productive cough, fever, headache EXAM: CHEST - 2 VIEW COMPARISON:  03/22/2024 FINDINGS: Frontal and lateral views of the chest demonstrate an unremarkable cardiac silhouette. No airspace disease, effusion, or pneumothorax. No acute bony abnormalities. IMPRESSION: 1. No acute intrathoracic process. Electronically Signed   By: Ozell Daring M.D.   On: 05/07/2024 16:07    Labs:  CBC: Recent Labs    04/14/24 0933 05/02/24 1111 05/07/24 1823 05/08/24 0510  WBC 15.9* 11.1* 10.3 9.3  HGB 9.6* 7.0* 6.7* 6.2*  HCT 29.7* 21.5* 18.4* 19.0*  PLT 546* 641* 564* 545*    COAGS: No results for input(s): INR, APTT in the last 8760 hours.  BMP: Recent Labs    03/22/24 1433 03/23/24 0941 05/02/24 1111 05/07/24 1823 05/08/24 0510  NA 137 137 135 137 135  K 3.4* 4.1 4.1 3.7  3.6  CL 102 106 101 102 102  CO2 25 22 25 24 24   GLUCOSE 149* 145* 84 81 115*  BUN 7 <5* 10 6 9   CALCIUM  9.4 9.2 8.7 9.1 8.9  CREATININE 0.89 0.87 0.80 0.84 0.94  GFRNONAA >60 >60  --  >60 >60    LIVER FUNCTION TESTS: Recent Labs    03/22/24 1433 03/23/24 0941 05/02/24 1111 05/07/24 1823 05/08/24 0510  BILITOT 0.4 0.4 0.6 0.5 0.5  AST 23 18 20 25 23   ALT 13 8 28 22 21   ALKPHOS 125 132*  --  166* 161*  PROT 8.4* 7.0 7.7 8.5* 7.8  ALBUMIN 3.4* 2.8*  --  3.0* 2.7*    Assessment and Plan: Patient known to IR team from right cervical lymph node biopsy in 2016, Port-A-Cath placement 2016, bone marrow biopsy and right iliac bone lesion biopsy in 2017, Port-A-Cath removal 2017, retroperitoneal lymph node biopsy 2018, new Port-A-Cath placement 2018 with subsequent removal in 2021.  She is a 39 year old female smoker with past medical history significant for depression, chronic bronchitis, esophagitis, HIV, HSV, and nodular sclerosing Hodgkin's lymphoma initially diagnosed 2016 with subsequent chemotherapy.  Patient had relapse in 2018 and completed chemotherapy with no signs of cancer since 2019.  She is now admitted with severe anemia, recurrent  fever, chills and CT scan showing signs of abdominal lymphadenopathy worrisome for recurrent lymphoma.  She has been transfused.  Patient was seen by oncology and request now received for image guided bone marrow biopsy along with retroperitoneal lymph node biopsy.  Imaging studies have been reviewed by Dr. Hughes. Risks and benefits of procedures were discussed with the patient/fiance including, but not limited to bleeding, infection, damage to adjacent structures or low yield requiring additional tests.  All of the questions were answered and there is agreement to proceed.  Consent signed and in chart.  Procedures tentatively scheduled for 1/13  Electronically Signed: D. Franky Rakers, PA-C 05/08/2024, 3:00 PM   I spent a total of 25 Minutes  at the the patient's bedside AND on the patient's hospital floor or unit, greater than 50% of which was counseling/coordinating care for image guided bone marrow biopsy/retroperitoneal lymph node biopsy    Patient ID: [name], female   DOB: 07-24-1985, 39 y.o.   MRN: [id number]  "

## 2024-05-08 NOTE — ED Notes (Signed)
 Blood not available from blood bank at this time.

## 2024-05-09 ENCOUNTER — Observation Stay (HOSPITAL_COMMUNITY): Payer: Medicare (Managed Care)

## 2024-05-09 ENCOUNTER — Other Ambulatory Visit: Payer: Self-pay | Admitting: Hematology and Oncology

## 2024-05-09 ENCOUNTER — Telehealth: Payer: Self-pay

## 2024-05-09 DIAGNOSIS — B20 Human immunodeficiency virus [HIV] disease: Secondary | ICD-10-CM | POA: Diagnosis not present

## 2024-05-09 DIAGNOSIS — D649 Anemia, unspecified: Secondary | ICD-10-CM | POA: Diagnosis not present

## 2024-05-09 DIAGNOSIS — R509 Fever, unspecified: Secondary | ICD-10-CM | POA: Diagnosis not present

## 2024-05-09 DIAGNOSIS — C8118 Nodular sclerosis classical Hodgkin lymphoma, lymph nodes of multiple sites: Secondary | ICD-10-CM | POA: Diagnosis not present

## 2024-05-09 LAB — BPAM RBC
Blood Product Expiration Date: 202602092359
Blood Product Expiration Date: 202602092359
ISSUE DATE / TIME: 202601121040
ISSUE DATE / TIME: 202601121710
Unit Type and Rh: 9500
Unit Type and Rh: 9500

## 2024-05-09 LAB — CBC WITH DIFFERENTIAL/PLATELET
Abs Immature Granulocytes: 0.05 K/uL (ref 0.00–0.07)
Basophils Absolute: 0 K/uL (ref 0.0–0.1)
Basophils Relative: 0 %
Eosinophils Absolute: 0.1 K/uL (ref 0.0–0.5)
Eosinophils Relative: 1 %
HCT: 26.8 % — ABNORMAL LOW (ref 36.0–46.0)
Hemoglobin: 9.3 g/dL — ABNORMAL LOW (ref 12.0–15.0)
Immature Granulocytes: 1 %
Lymphocytes Relative: 10 %
Lymphs Abs: 0.9 K/uL (ref 0.7–4.0)
MCH: 35 pg — ABNORMAL HIGH (ref 26.0–34.0)
MCHC: 34.7 g/dL (ref 30.0–36.0)
MCV: 100.8 fL — ABNORMAL HIGH (ref 80.0–100.0)
Monocytes Absolute: 1.1 K/uL — ABNORMAL HIGH (ref 0.1–1.0)
Monocytes Relative: 13 %
Neutro Abs: 6.8 K/uL (ref 1.7–7.7)
Neutrophils Relative %: 75 %
Platelets: 503 K/uL — ABNORMAL HIGH (ref 150–400)
RBC: 2.66 MIL/uL — ABNORMAL LOW (ref 3.87–5.11)
RDW: 24.7 % — ABNORMAL HIGH (ref 11.5–15.5)
WBC: 8.9 K/uL (ref 4.0–10.5)
nRBC: 0 % (ref 0.0–0.2)

## 2024-05-09 LAB — FOLATE: Folate: 13.4 ng/mL

## 2024-05-09 LAB — TYPE AND SCREEN
ABO/RH(D): O NEG
Antibody Screen: POSITIVE
DAT, IgG: POSITIVE
Unit division: 0
Unit division: 0

## 2024-05-09 LAB — IRON AND TIBC
Iron: 26 ug/dL — ABNORMAL LOW (ref 28–170)
Saturation Ratios: 17 % (ref 10.4–31.8)
TIBC: 154 ug/dL — ABNORMAL LOW (ref 250–450)
UIBC: 128 ug/dL

## 2024-05-09 LAB — URIC ACID: Uric Acid, Serum: 2.9 mg/dL (ref 2.5–7.1)

## 2024-05-09 LAB — RETICULOCYTES
Immature Retic Fract: 30.8 % — ABNORMAL HIGH (ref 2.3–15.9)
RBC.: 2.67 MIL/uL — ABNORMAL LOW (ref 3.87–5.11)
Retic Count, Absolute: 127.9 K/uL (ref 19.0–186.0)
Retic Ct Pct: 4.8 % — ABNORMAL HIGH (ref 0.4–3.1)

## 2024-05-09 LAB — SEDIMENTATION RATE: Sed Rate: 124 mm/h — ABNORMAL HIGH (ref 0–22)

## 2024-05-09 LAB — PROTIME-INR
INR: 1.3 — ABNORMAL HIGH (ref 0.8–1.2)
Prothrombin Time: 17.2 s — ABNORMAL HIGH (ref 11.4–15.2)

## 2024-05-09 LAB — FERRITIN: Ferritin: 2779 ng/mL — ABNORMAL HIGH (ref 11–307)

## 2024-05-09 LAB — VITAMIN B12: Vitamin B-12: 632 pg/mL (ref 180–914)

## 2024-05-09 LAB — LACTATE DEHYDROGENASE: LDH: 304 U/L — ABNORMAL HIGH (ref 105–235)

## 2024-05-09 MED ORDER — TRAMADOL HCL 50 MG PO TABS: 100.0000 mg | ORAL_TABLET | Freq: Four times a day (QID) | ORAL | 0 refills | Status: DC | PRN

## 2024-05-09 MED ORDER — FENTANYL CITRATE (PF) 100 MCG/2ML IJ SOLN
INTRAMUSCULAR | Status: AC
  Filled 2024-05-09: qty 2

## 2024-05-09 MED ORDER — DIPHENHYDRAMINE HCL 50 MG/ML IJ SOLN
INTRAMUSCULAR | Status: AC
  Filled 2024-05-09: qty 1

## 2024-05-09 MED ORDER — DIPHENHYDRAMINE HCL 50 MG/ML IJ SOLN
INTRAMUSCULAR | Status: AC | PRN
  Administered 2024-05-09: 25 mg via INTRAVENOUS

## 2024-05-09 MED ORDER — MIDAZOLAM HCL (PF) 2 MG/2ML IJ SOLN
INTRAMUSCULAR | Status: AC | PRN
  Administered 2024-05-09 (×4): 1 mg via INTRAVENOUS

## 2024-05-09 MED ORDER — FENTANYL CITRATE (PF) 100 MCG/2ML IJ SOLN
INTRAMUSCULAR | Status: AC | PRN
  Administered 2024-05-09 (×2): 50 ug via INTRAVENOUS

## 2024-05-09 MED ORDER — FERROUS SULFATE 325 (65 FE) MG PO TBEC: 325.0000 mg | DELAYED_RELEASE_TABLET | Freq: Every day | ORAL | 3 refills | Status: DC

## 2024-05-09 MED ORDER — ENSURE PLUS HIGH PROTEIN PO LIQD: 237.0000 mL | Freq: Three times a day (TID) | ORAL | 1 refills | Status: AC

## 2024-05-09 MED ORDER — ENSURE PLUS HIGH PROTEIN PO LIQD
237.0000 mL | Freq: Three times a day (TID) | ORAL | Status: DC
  Administered 2024-05-09: 237 mL via ORAL

## 2024-05-09 MED ORDER — MIDAZOLAM HCL 2 MG/2ML IJ SOLN
INTRAMUSCULAR | Status: AC
  Filled 2024-05-09: qty 4

## 2024-05-09 NOTE — Care Management Obs Status (Signed)
 MEDICARE OBSERVATION STATUS NOTIFICATION   Patient Details  Name: [name] MRN: [id number] Date of Birth: May 18, 1985   Medicare Observation Status Notification Given:  Yes    Doneta Glenys DASEN, RN 05/09/2024, 10:27 AM

## 2024-05-09 NOTE — Progress Notes (Signed)
 Discharge instructions reviewed with patient, verbalized understanding. All questions answered. All belongings accounted for. Patient to follow up with MD in  1-2 weeks. PIV removed. Assisted via WC to private vehicle.
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
## 2024-05-09 NOTE — Progress Notes (Signed)
 [name]   DOB:October 01, 1985   FM#:[id number]    ASSESSMENT & PLAN:   History of recurrent nodular sclerosing Hodgkin lymphoma The patient was diagnosed with Hodgkin lymphoma in 2016 in the setting of HIV disease Treatment course was complicated by severe anemia, noncompliance with appointment etc. 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, CD30 positive She has complete response with no signs of recurrent disease since 2019 CT imaging from yesterday was reviewed by myself independently which showed abdominal lymphadenopathy, worrisome for recurrent lymphoma Bone marrow biopsy and CT-guided lymph node biopsy is pending After she is discharged, I will arrange outpatient PET/CT imaging  Severe anemia This is worrisome for possible bone marrow involvement Improved blood count, anemia panel show adequate micronutrients I will check her blood count again next week in my clinic  Chronic pain I will refill her tramadol   Discharge planning She can be discharged today  Almarie Bedford, MD 05/09/2024 11:04 AM  Subjective:  She appears cheerful.  Status post biopsy today.  Repeat CBC showed hemoglobin has improved She is ready to be discharged  Objective:  Vitals:   05/09/24 0930 05/09/24 0935  BP: 106/74 106/74  Pulse: (!) 115 (!) 129  Resp: 20 20  Temp:    SpO2: 100% 100%     Intake/Output Summary (Last 24 hours) at 05/09/2024 1104 Last data filed at 05/09/2024 0420 Gross per 24 hour  Intake 1139.2 ml  Output --  Net 1139.2 ml

## 2024-05-09 NOTE — Telephone Encounter (Signed)
-----   Message from Almarie Bedford, MD sent at 05/09/2024  9:10 AM EST ----- Can you schedule 60 mins appt on Friday 1/23 at 2 pm? Please print out the appt Thanks

## 2024-05-09 NOTE — Telephone Encounter (Signed)
 Called and left a message with 1/23 appts.

## 2024-05-09 NOTE — Discharge Summary (Signed)
 " Physician Discharge Summary   Patient: [name] MRN: [id number] DOB: [birth year]  Admit date:     05/07/2024  Discharge date: {dischdate:26783}  Discharge Physician: Concepcion Riser   PCP: Pcp, No   Recommendations at discharge:  {Tip this will not be part of the note when signed- Example include specific recommendations for outpatient follow-up, pending tests to follow-up on. (Optional):26781}  ***  Discharge Diagnoses: Principal Problem:   Fever and chills Active Problems:   Human immunodeficiency virus (HIV) disease (HCC)   Hodgkin lymphoma, nodular sclerosis (HCC)   Symptomatic anemia  Resolved Problems:   * No resolved hospital problems. [facility] Course: No notes on file  Assessment and Plan: No notes have been filed under this hospital service. Service: Hospitalist     {Tip this will not be part of the note when signed There is no height or weight on file to calculate BMI. , ,  (Optional):26781}  {(NOTE) Pain control PDMP Statment (Optional):26782} Consultants: *** Procedures performed: ***  Disposition: {Plan; Disposition:26390} Diet recommendation:  Discharge Diet Orders (From admission, onward)     Start     Ordered   05/09/24 0000  Diet general        05/09/24 1046           {Diet_Plan:26776} DISCHARGE MEDICATION: Allergies as of 05/09/2024       Reactions   Temazepam  Other (See Comments)   Confusion/dizziness         Medication List     STOP taking these medications    desonide 0.05 % lotion Commonly known as: DESOWEN   Flintstones Plus Extra Iron 18 MG Chew   Fluocinolone Acetonide 0.01 % Oil   Lumify 0.025 % Soln Generic drug: Brimonidine Tartrate   omeprazole  40 MG capsule Commonly known as: PRILOSEC   predniSONE  20 MG tablet Commonly known as: DELTASONE        TAKE these medications    Biktarvy  50-200-25 MG Tabs tablet Generic drug: bictegravir-emtricitabine -tenofovir  AF Take 1 tablet by mouth  daily.   feeding supplement Liqd Take 237 mLs by mouth 3 (three) times daily between meals.   ferrous sulfate  325 (65 FE) MG EC tablet Take 1 tablet (325 mg total) by mouth daily with breakfast.   hydrocortisone  2.5 % rectal cream Commonly known as: ANUSOL -HC Place 1 Application rectally 2 (two) times daily.   mirtazapine  15 MG tablet Commonly known as: REMERON  TAKE 1 TABLET(15 MG) BY MOUTH AT BEDTIME   mupirocin  cream 2 % Commonly known as: BACTROBAN  Apply 1 Application topically 2 (two) times daily.   ondansetron  4 MG tablet Commonly known as: ZOFRAN  Take 4 mg by mouth every 8 (eight) hours as needed for nausea or vomiting.   traMADol  50 MG tablet Commonly known as: ULTRAM  Take 2 tablets (100 mg total) by mouth every 6 (six) hours as needed for severe pain (pain score 7-10).   TYLENOL  500 MG tablet Generic drug: acetaminophen  Take 500-1,000 mg by mouth every 6 (six) hours as needed for mild pain (pain score 1-3) (or headaches).        Follow-up Information     PCP. Call in 1 week(s).                 Discharge Exam: There were no vitals filed for this visit. ***  Condition at discharge: {DC Condition:26389}  The results of significant diagnostics from this hospitalization (including imaging, microbiology, ancillary and laboratory) are listed below for reference.   Imaging Studies: CT  HEAD WO CONTRAST ( ) Result Date: 05/07/2024 EXAM: CT HEAD WITHOUT CONTRAST 05/07/2024 10:08:21 PM TECHNIQUE: CT of the head was performed without the administration of intravenous contrast. Automated exposure control, iterative reconstruction, and/or weight based adjustment of the mA/kV was utilized to reduce the radiation dose to as low as reasonably achievable. COMPARISON: CT head 3:14:6 CLINICAL HISTORY: Headache, sudden, severe. FINDINGS: BRAIN AND VENTRICLES: No acute hemorrhage. No evidence of acute infarct. No hydrocephalus. No extra-axial collection. No mass effect or  midline shift. ORBITS: No acute abnormality. SINUSES: Mucosal thickening of the bilateral sphenoid and ethmoid sinuses. SOFT TISSUES AND SKULL: No acute soft tissue abnormality. No skull fracture. Mastoid air cells are clear. IMPRESSION: 1. No acute intracranial abnormality. 2. Mucosal thickening of the bilateral sphenoid and ethmoid sinuses. Electronically signed by: Morgane Naveau MD MD 05/07/2024 10:12 PM EST RP Workstation: [id number]   CT CHEST ABDOMEN PELVIS W CONTRAST Result Date: 05/07/2024 EXAM: CT CHEST, ABDOMEN AND PELVIS WITH CONTRAST 05/07/2024 07:17:33 PM TECHNIQUE: CT of the chest, abdomen and pelvis was performed with the administration of 100 mL of iohexol  (OMNIPAQUE ) 300 MG/ML solution. Multiplanar reformatted images are provided for review. Automated exposure control, iterative reconstruction, and/or weight based adjustment of the mA/kV was utilized to reduce the radiation dose to as low as reasonably achievable. COMPARISON: 12/05/2019 CLINICAL HISTORY: Sepsis. Additional history of Hodgkin lymphoma on the prior. FINDINGS: CHEST: MEDIASTINUM AND LYMPH NODES: Heart and pericardium are unremarkable. The central airways are clear. No mediastinal, hilar or axillary lymphadenopathy. LUNGS AND PLEURA: Mild paraseptal emphysematous changes at the lung apices. No focal consolidation or pulmonary edema. No pleural effusion. No pneumothorax. ABDOMEN AND PELVIS: LIVER: Unremarkable. GALLBLADDER AND BILE DUCTS: Unremarkable. No biliary ductal dilatation. SPLEEN: Spleen is normal in size. PANCREAS: No acute abnormality. ADRENAL GLANDS: No acute abnormality. KIDNEYS, URETERS AND BLADDER: No stones in the kidneys or ureters. No hydronephrosis. No perinephric or periureteral stranding. Urinary bladder is unremarkable. GI AND BOWEL: Stomach demonstrates no acute abnormality. There is no bowel obstruction. REPRODUCTIVE ORGANS: Uterus is within normal limits. PERITONEUM AND RETROPERITONEUM: No ascites. No free  air. VASCULATURE: Aorta is normal in caliber. ABDOMINAL AND PELVIS LYMPH NODES: Abdominopelvic lymphadenopathy, including: 1.4 cm right retrocrural node (image 57), 3.6 cm mesenteric node in the right upper abdomen (image 82), 2.2 cm short axis right paraaortic node (image 80), 2.2 cm short axis left common iliac node (image 89), 2.8 cm right internal iliac node (image 98). BONES AND SOFT TISSUES: No acute osseous abnormality. No focal soft tissue abnormality. IMPRESSION: 1. Abdominopelvic lymphadenopathy, as above, suggesting recurrent lymphoma. 2. Spleen is normal in size. Electronically signed by: Pinkie Pebbles MD MD 05/07/2024 07:34 PM EST RP Workstation: [id number]   DG Chest 2 View Result Date: 05/07/2024 CLINICAL DATA:  Productive cough, fever, headache EXAM: CHEST - 2 VIEW COMPARISON:  03/22/2024 FINDINGS: Frontal and lateral views of the chest demonstrate an unremarkable cardiac silhouette. No airspace disease, effusion, or pneumothorax. No acute bony abnormalities. IMPRESSION: 1. No acute intrathoracic process. Electronically Signed   By: Ozell Daring M.D.   On: 05/07/2024 16:07    Microbiology: Results for orders placed or performed during the hospital encounter of 05/07/24  Resp panel by RT-PCR (RSV, Flu A&B, Covid) Anterior Nasal Swab     Status: None   Collection Time: 05/07/24  4:54 PM   Specimen: Anterior Nasal Swab  Result Value Ref Range Status   SARS Coronavirus 2 by RT PCR NEGATIVE NEGATIVE Final    Comment: (NOTE) SARS-CoV-2 target  nucleic acids are NOT DETECTED.  The SARS-CoV-2 RNA is generally detectable in upper respiratory specimens during the acute phase of infection. The lowest concentration of SARS-CoV-2 viral copies this assay can detect is 138 copies/mL. A negative result does not preclude SARS-Cov-2 infection and should not be used as the sole basis for treatment or other patient management decisions. A negative result may occur with  improper specimen  collection/handling, submission of specimen other than nasopharyngeal swab, presence of viral mutation(s) within the areas targeted by this assay, and inadequate number of viral copies(<138 copies/mL). A negative result must be combined with clinical observations, patient history, and epidemiological information. The expected result is Negative.  Fact Sheet for Patients:  bloggercourse.com  Fact Sheet for Healthcare Providers:  seriousbroker.it  This test is no t yet approved or cleared by the [place]  FDA and  has been authorized for detection and/or diagnosis of SARS-CoV-2 by FDA under an Emergency Use Authorization (EUA). This EUA will remain  in effect (meaning this test can be used) for the duration of the COVID-19 declaration under Section 564(b)(1) of the Act, 21 U.S.C.section 360bbb-3(b)(1), unless the authorization is terminated  or revoked sooner.       Influenza A by PCR NEGATIVE NEGATIVE Final   Influenza B by PCR NEGATIVE NEGATIVE Final    Comment: (NOTE) The Xpert Xpress SARS-CoV-2/FLU/RSV plus assay is intended as an aid in the diagnosis of influenza from Nasopharyngeal swab specimens and should not be used as a sole basis for treatment. Nasal washings and aspirates are unacceptable for Xpert Xpress SARS-CoV-2/FLU/RSV testing.  Fact Sheet for Patients: bloggercourse.com  Fact Sheet for Healthcare Providers: seriousbroker.it  This test is not yet approved or cleared by the [place]  FDA and has been authorized for detection and/or diagnosis of SARS-CoV-2 by FDA under an Emergency Use Authorization (EUA). This EUA will remain in effect (meaning this test can be used) for the duration of the COVID-19 declaration under Section 564(b)(1) of the Act, 21 U.S.C. section 360bbb-3(b)(1), unless the authorization is terminated or revoked.     Resp Syncytial  Virus by PCR NEGATIVE NEGATIVE Final    Comment: (NOTE) Fact Sheet for Patients: bloggercourse.com  Fact Sheet for Healthcare Providers: seriousbroker.it  This test is not yet approved or cleared by the [place]  FDA and has been authorized for detection and/or diagnosis of SARS-CoV-2 by FDA under an Emergency Use Authorization (EUA). This EUA will remain in effect (meaning this test can be used) for the duration of the COVID-19 declaration under Section 564(b)(1) of the Act, 21 U.S.C. section 360bbb-3(b)(1), unless the authorization is terminated or revoked.  Performed at [facility], [address] [place], [place] [address]   Blood culture (routine x 2)     Status: None (Preliminary result)   Collection Time: 05/07/24  7:34 PM   Specimen: BLOOD  Result Value Ref Range Status   Specimen Description   Final    BLOOD RIGHT ANTECUBITAL Performed at [facility], [address] [place], [place] [address]    Special Requests   Final    BOTTLES DRAWN AEROBIC AND ANAEROBIC Blood Culture adequate volume Performed at [facility], [address], [place], [place] [address]    Culture   Final    NO GROWTH 2 DAYS Performed at [facility], [address], [place], [place] [address]    Report Status PENDING  Incomplete   *Note: Due to a large number of  results and/or encounters for the requested time period, some results have not been displayed. A complete set of results can be found in Results Review.    Labs: CBC: Recent Labs  Lab 05/07/24 1823 05/08/24 0510 05/09/24 0047  WBC 10.3 9.3 8.9  NEUTROABS 7.8*  --  6.8  HGB 6.7* 6.2* 9.3*  HCT 18.4* 19.0* 26.8*  MCV 119.5* 116.6* 100.8*  PLT 564* 545* 503*   Basic Metabolic Panel: Recent Labs  Lab 05/07/24 1823 05/08/24 0510  NA 137 135  K 3.7 3.6  CL 102 102  CO2 24 24  GLUCOSE 81 115*  BUN 6 9   CREATININE 0.84 0.94  CALCIUM  9.1 8.9   Liver Function Tests: Recent Labs  Lab 05/07/24 1823 05/08/24 0510  AST 25 23  ALT 22 21  ALKPHOS 166* 161*  BILITOT 0.5 0.5  PROT 8.5* 7.8  ALBUMIN 3.0* 2.7*   CBG: No results for input(s): GLUCAP in the last 168 hours.  Discharge time spent: {LESS THAN/GREATER UYJW:73611} 30 minutes.  Signed: Concepcion Riser, MD [facility] 05/09/2024 "

## 2024-05-09 NOTE — Procedures (Signed)
 Interventional Radiology Procedure Note  Procedure:  1) CT guided bone marrow biopsy 2) CT guided retroperitoneal lymph node biopsy  Findings: Please refer to procedural dictation for full description.  Left iliac bone marrow aspirate and core. Right infrarenal retroperitoneal lymph node biopsy 18 ga core x 2.   Complications: None immediate  Estimated Blood Loss: < 5 mL  Recommendations: 2 hour bedrest Follow Pathology results   Ester Sides, MD

## 2024-05-11 ENCOUNTER — Telehealth: Payer: Self-pay

## 2024-05-11 ENCOUNTER — Other Ambulatory Visit: Payer: Self-pay | Admitting: Hematology and Oncology

## 2024-05-11 DIAGNOSIS — C8118 Nodular sclerosis classical Hodgkin lymphoma, lymph nodes of multiple sites: Secondary | ICD-10-CM

## 2024-05-11 NOTE — Telephone Encounter (Signed)
 Called and given radiology scheduling #. She will call to schedule PET.

## 2024-05-11 NOTE — Telephone Encounter (Signed)
-----   Message from Almarie Bedford, MD sent at 05/11/2024  9:32 AM EST ----- I ordered PET to be done next week Give her number to call and track Ok to have it scheduled if after my appt

## 2024-05-12 LAB — CULTURE, BLOOD (ROUTINE X 2)
Culture: NO GROWTH
Special Requests: ADEQUATE

## 2024-05-12 LAB — SURGICAL PATHOLOGY

## 2024-05-12 NOTE — Progress Notes (Signed)
 Called [facility] pathology and transferred to bone marrow. They will add cytogenetics and myeloid NGS panel to CASE: [id number].

## 2024-05-15 ENCOUNTER — Other Ambulatory Visit (HOSPITAL_COMMUNITY): Payer: Self-pay

## 2024-05-15 ENCOUNTER — Telehealth: Payer: Self-pay

## 2024-05-15 ENCOUNTER — Other Ambulatory Visit: Payer: Self-pay | Admitting: Hematology and Oncology

## 2024-05-15 DIAGNOSIS — C8118 Nodular sclerosis classical Hodgkin lymphoma, lymph nodes of multiple sites: Secondary | ICD-10-CM

## 2024-05-15 NOTE — Telephone Encounter (Signed)
 Returned her call and told her [facility] is filling the rest of her Tramadol  Rx now. Sent a message to PA team to work on [place].

## 2024-05-15 NOTE — Telephone Encounter (Signed)
 Oral Oncology Patient Advocate Encounter   Received notification that prior authorization for Tramadol  is required.   PA submitted on 05/15/2024 Key [id number] Status is pending      Charlott Hamilton,  CPhT-Adv  she/her/hers [facility]  [facility] Pharmacy Technician Patient Advocate Specialist III [facility] Phone: [phone]  Fax: [phone] [email or web address]

## 2024-05-15 NOTE — Telephone Encounter (Signed)
 Oral Oncology Patient Advocate Encounter  No Prior Auth required at this time through [facility].   #90/12 day supply/$2.04 copay      Charlott Hamilton,  CPhT-Adv  she/her/hers [facility]  [facility] Special[facility] Pharmacy Services Pharmacy Technician Patient Advocate Specialist III [facility] Phone: [phone]  Fax: [phone] [email or web address]

## 2024-05-16 ENCOUNTER — Other Ambulatory Visit: Payer: Self-pay | Admitting: Hematology and Oncology

## 2024-05-16 DIAGNOSIS — R634 Abnormal weight loss: Secondary | ICD-10-CM

## 2024-05-16 DIAGNOSIS — C8118 Nodular sclerosis classical Hodgkin lymphoma, lymph nodes of multiple sites: Secondary | ICD-10-CM

## 2024-05-17 ENCOUNTER — Encounter (HOSPITAL_COMMUNITY): Payer: Self-pay

## 2024-05-17 ENCOUNTER — Other Ambulatory Visit: Payer: Self-pay

## 2024-05-18 ENCOUNTER — Encounter: Payer: Self-pay | Admitting: Hematology and Oncology

## 2024-05-18 ENCOUNTER — Encounter (HOSPITAL_COMMUNITY)
Admission: RE | Admit: 2024-05-18 | Discharge: 2024-05-18 | Disposition: A | Discharge status: Home / Self Care | Payer: Medicare (Managed Care) | Source: Ambulatory Visit | Attending: Hematology and Oncology | Admitting: Hematology and Oncology

## 2024-05-18 DIAGNOSIS — C8118 Nodular sclerosis classical Hodgkin lymphoma, lymph nodes of multiple sites: Secondary | ICD-10-CM | POA: Diagnosis present

## 2024-05-18 LAB — GLUCOSE, CAPILLARY: Glucose-Capillary: 84 mg/dL (ref 70–99)

## 2024-05-18 MED ORDER — FLUDEOXYGLUCOSE F - 18 (FDG) INJECTION
5.3000 | Freq: Once | INTRAVENOUS | Status: AC
  Administered 2024-05-18: 5.44 via INTRAVENOUS

## 2024-05-19 ENCOUNTER — Inpatient Hospital Stay: Admitting: Hematology and Oncology

## 2024-05-19 ENCOUNTER — Inpatient Hospital Stay: Payer: Medicare (Managed Care) | Admitting: Hematology and Oncology

## 2024-05-19 ENCOUNTER — Inpatient Hospital Stay: Attending: Hematology and Oncology

## 2024-05-19 VITALS — BP 106/73 | HR 101 | Temp 100.3°F | Resp 18 | Ht 64.0 in | Wt 108.4 lb

## 2024-05-19 DIAGNOSIS — Z79620 Long term (current) use of immunosuppressive biologic: Secondary | ICD-10-CM | POA: Insufficient documentation

## 2024-05-19 DIAGNOSIS — C8118 Nodular sclerosis classical Hodgkin lymphoma, lymph nodes of multiple sites: Secondary | ICD-10-CM

## 2024-05-19 DIAGNOSIS — C8170 Other classical Hodgkin lymphoma, unspecified site: Secondary | ICD-10-CM | POA: Insufficient documentation

## 2024-05-19 DIAGNOSIS — Z5112 Encounter for antineoplastic immunotherapy: Secondary | ICD-10-CM | POA: Diagnosis present

## 2024-05-19 DIAGNOSIS — D539 Nutritional anemia, unspecified: Secondary | ICD-10-CM

## 2024-05-19 DIAGNOSIS — R634 Abnormal weight loss: Secondary | ICD-10-CM

## 2024-05-19 LAB — CBC WITH DIFFERENTIAL (CANCER CENTER ONLY)
Abs Immature Granulocytes: 0.05 K/uL (ref 0.00–0.07)
Basophils Absolute: 0 K/uL (ref 0.0–0.1)
Basophils Relative: 0 %
Eosinophils Absolute: 0.2 K/uL (ref 0.0–0.5)
Eosinophils Relative: 2 %
HCT: 27.1 % — ABNORMAL LOW (ref 36.0–46.0)
Hemoglobin: 9.3 g/dL — ABNORMAL LOW (ref 12.0–15.0)
Immature Granulocytes: 1 %
Lymphocytes Relative: 12 %
Lymphs Abs: 1.1 K/uL (ref 0.7–4.0)
MCH: 35.2 pg — ABNORMAL HIGH (ref 26.0–34.0)
MCHC: 34.3 g/dL (ref 30.0–36.0)
MCV: 102.7 fL — ABNORMAL HIGH (ref 80.0–100.0)
Monocytes Absolute: 1.1 K/uL — ABNORMAL HIGH (ref 0.1–1.0)
Monocytes Relative: 13 %
Neutro Abs: 6.5 K/uL (ref 1.7–7.7)
Neutrophils Relative %: 72 %
Platelet Count: 559 K/uL — ABNORMAL HIGH (ref 150–400)
RBC: 2.64 MIL/uL — ABNORMAL LOW (ref 3.87–5.11)
RDW: 20.2 % — ABNORMAL HIGH (ref 11.5–15.5)
WBC Count: 8.9 K/uL (ref 4.0–10.5)
nRBC: 0 % (ref 0.0–0.2)

## 2024-05-19 LAB — CMP (CANCER CENTER ONLY)
ALT: 43 U/L (ref 0–44)
AST: 36 U/L (ref 15–41)
Albumin: 3.3 g/dL — ABNORMAL LOW (ref 3.5–5.0)
Alkaline Phosphatase: 154 U/L — ABNORMAL HIGH (ref 38–126)
Anion gap: 11 (ref 5–15)
BUN: 12 mg/dL (ref 6–20)
CO2: 27 mmol/L (ref 22–32)
Calcium: 9.5 mg/dL (ref 8.9–10.3)
Chloride: 98 mmol/L (ref 98–111)
Creatinine: 0.76 mg/dL (ref 0.44–1.00)
GFR, Estimated: >60 mL/min
Glucose, Bld: 99 mg/dL (ref 70–99)
Potassium: 4.1 mmol/L (ref 3.5–5.1)
Sodium: 136 mmol/L (ref 135–145)
Total Bilirubin: 0.6 mg/dL (ref 0.0–1.2)
Total Protein: 9.2 g/dL — ABNORMAL HIGH (ref 6.5–8.1)

## 2024-05-19 LAB — SAMPLE TO BLOOD BANK

## 2024-05-19 LAB — TSH: TSH: 1.24 u[IU]/mL (ref 0.350–4.500)

## 2024-05-19 MED ORDER — PREDNISONE 20 MG PO TABS: 40.0000 mg | ORAL_TABLET | Freq: Every day | ORAL | 0 refills | Status: AC

## 2024-05-19 MED ORDER — PROCHLORPERAZINE MALEATE 10 MG PO TABS: 10.0000 mg | ORAL_TABLET | Freq: Four times a day (QID) | ORAL | 1 refills | Status: AC | PRN

## 2024-05-19 MED ORDER — LIDOCAINE-PRILOCAINE 2.5-2.5 % EX CREA: TOPICAL_CREAM | CUTANEOUS | 3 refills | Status: AC

## 2024-05-19 MED ORDER — ONDANSETRON HCL 8 MG PO TABS: 8.0000 mg | ORAL_TABLET | Freq: Three times a day (TID) | ORAL | 1 refills | Status: AC | PRN

## 2024-05-19 NOTE — Progress Notes (Signed)
 [facility] OFFICE PROGRESS NOTE  Patient Care Team: Pcp, No as PCP - General Eveline Lynwood MATSU, MD as Consulting Physician (Obstetrics and Gynecology) Darlean Ozell NOVAK, MD as Consulting Physician (Pulmonary Disease) Kerrin Elspeth BROCKS, MD as Consulting Physician (Cardiothoracic Surgery) Efrain Lamar ORN, MD (Inactive) as Consulting Physician (Infectious Diseases)  Assessment & Plan Nodular sclerosis Hodgkin lymphoma of lymph nodes of multiple regions [facility]) The patient was diagnosed with Hodgkin lymphoma in 2016 in the setting of HIV disease Treatment course was complicated by severe anemia, noncompliance with appointment etc. 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, CD30 positive She has complete response with no signs of recurrent disease since 2019  She developed severe recurrent anemia and was recently hospitalized with fever I reviewed PET/CT imaging with the patient which showed clear signs of recurrent lymphoma, clinical stage III disease Her recent bone marrow biopsy was negative for involvement Lymph node biopsy came back positive and confirmed recurrent classical Hodgkin lymphoma We discussed next plan of care I recommend trial of brentuximab with nivolumab treatment I will refer her back to [facility] to discuss the role of autologous stem cell transplant in the future We discussed side effects to be expected She will get port placement Plan to repeat imaging study after 2-3 cycles of treatment Deficiency anemia The cause of her anemia is multifactorial She has received blood transfusion support recently Blood count is stable Monitor closely  No orders of the defined types were placed in this  encounter.    Almarie Bedford, MD  INTERVAL HISTORY: she returns for surveillance follow-up with multiple family members She felt fine since discharge I reviewed multiple imaging studies and pathology report We discussed plan of care with chemotherapy and eventual bone marrow transplant  PHYSICAL EXAMINATION: ECOG PERFORMANCE STATUS: 0 - Asymptomatic  Vitals:   05/19/24 1400  BP: 106/73  Pulse: (!) 101  Resp: 18  Temp: 100.3 F (37.9 C)  SpO2: 100%   Filed Weights   05/19/24 1400  Weight: 108 lb 6.4 oz (49.2 kg)    Relevant data reviewed during this visit included CBC, CMP, PET/CT imaging, pathology report

## 2024-05-19 NOTE — Assessment & Plan Note (Addendum)
 The cause of her anemia is multifactorial She has received blood transfusion support recently Blood count is stable Monitor closely

## 2024-05-19 NOTE — Assessment & Plan Note (Addendum)
 The patient was diagnosed with Hodgkin lymphoma in 2016 in the setting of HIV disease Treatment course was complicated by severe anemia, noncompliance with appointment etc. 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, CD30 positive She has complete response with no signs of recurrent disease since 2019  She developed severe recurrent anemia and was recently hospitalized with fever I reviewed PET/CT imaging with the patient which showed clear signs of recurrent lymphoma, clinical stage III disease Her recent bone marrow biopsy was negative for involvement Lymph node biopsy came back positive and confirmed recurrent classical Hodgkin lymphoma We discussed next plan of care I recommend trial of brentuximab with nivolumab treatment I will refer her back to [facility] to discuss the role of autologous stem cell transplant in the future We discussed side effects to be expected She will get port placement Plan to repeat imaging study after 2-3 cycles of treatment

## 2024-05-20 ENCOUNTER — Telehealth: Payer: Self-pay | Admitting: Hematology and Oncology

## 2024-05-20 NOTE — Telephone Encounter (Signed)
 I spoke with patient and she is aware of infusion on 05/25/2024 at 7:30 am and appointments on 06/16/2024 for port flush w/lab, MD, and infusion.

## 2024-05-21 LAB — T4, FREE: Free T4: 0.97 ng/dL (ref 0.80–2.00)

## 2024-05-22 ENCOUNTER — Encounter (HOSPITAL_COMMUNITY): Admission: RE | Admit: 2024-05-22 | Payer: Medicare (Managed Care)

## 2024-05-22 ENCOUNTER — Other Ambulatory Visit: Payer: Self-pay | Admitting: Hematology and Oncology

## 2024-05-22 NOTE — H&P (Signed)
 "   Chief Complaint: Recurrent Hodgkin's lymphoma; referred for port a cath placement to assist with treatment  Referring Provider(s): Gorsuch,N  Supervising Physician: Luverne Aran  Patient Status: [facility] - Out-pt  History of Present Illness: [name] is a 39 y.o. female known to IR team from right cervical lymph node biopsy in 2016, Port-A-Cath placement 2016, bone marrow biopsy and right iliac bone lesion biopsy in 2017, Port-A-Cath removal 2017, retroperitoneal lymph node biopsy 2018, new Port-A-Cath placement 2018 with subsequent removal in 2021.  She is a 39 year old female smoker with past medical history significant for depression, chronic bronchitis, esophagitis, HIV, HSV, and nodular sclerosing Hodgkin's lymphoma initially diagnosed 2016 with subsequent chemotherapy.  Patient had relapse in 2018 and completed chemotherapy with no signs of cancer since 2019.  She was recently admitted with severe anemia, recurrent fever, chills and CT scan showing signs of abdominal lymphadenopathy worrisome for recurrent lymphoma.  She was transfused. She underwent BM bx as well as RP LN bx on 05/09/24 . LN bx revealed classic Hodgkin's lymphoma. She is scheduled today for port a cath placement to assist with treatment.   *** Patient is Full Code  Past Medical History:  Diagnosis Date   AIN III (anal intraepithelial neoplasia III)    Anemia    Cancer (HCC)    Hodgkin lymphoma   Chest wall pain 06/27/2015   Condyloma acuminatum in female    Depression    History of chronic bronchitis    History of esophagitis    CANDIDA   History of shingles    HIV (human immunodeficiency virus infection) (HCC)    Hodgkin's lymphoma (HCC) 06/12/2014   HSV (herpes simplex virus) infection    Hypokalemia 07/17/2014   Periodontitis, chronic    Screening examination for venereal disease 10/30/2013    Past Surgical History:  Procedure Laterality Date   DIAGNOSTIC LAPAROSCOPY WITH REMOVAL OF ECTOPIC  PREGNANCY N/A 11/17/2015   Procedure: LAPAROSCOPY LEFT  SALPINGECTOMY SECONDARY TO LEFT ECTOPIC PREGNANCY;  Surgeon: Norleen LULLA Server, MD;  Location: [facility];  Service: Gynecology;  Laterality: N/A;   DILATION AND CURETTAGE OF UTERUS  2005   MISSED AB   EXAMINATION UNDER ANESTHESIA N/A 09/23/2012   Procedure: EXAM UNDER ANESTHESIA;  Surgeon: Elspeth KYM Schultze, MD;  Location: [facility];  Service: General;  Laterality: N/A;   IR GENERIC HISTORICAL  05/26/2016   IR FLUORO GUIDE PORT INSERTION RIGHT 05/26/2016 [facility]   IR GENERIC HISTORICAL  05/26/2016   IR US  GUIDE VASC ACCESS RIGHT 05/26/2016 [facility]   IR REMOVAL TUN ACCESS W/ PORT W/O FL MOD SED  09/19/2019   LASER ABLATION CONDOLAMATA N/A 09/23/2012   Procedure: REMOVAL/ABLATION  ABLATION CONDOLAMATA WARTS;  Surgeon: Elspeth KYM Schultze, MD;  Location: [facility];  Service: General;  Laterality: N/A;    Allergies: Temazepam   Medications: Prior to Admission medications  Medication Sig Start Date End Date Taking? Authorizing Provider  bictegravir-emtricitabine -tenofovir  AF (BIKTARVY ) 50-200-25 MG TABS tablet Take 1 tablet by mouth daily. 12/15/23   Fleeta Kathie Jomarie LOISE, MD  feeding supplement (ENSURE PLUS HIGH PROTEIN) LIQD Take 237 mLs by mouth 3 (three) times daily between meals. 05/09/24   Darci Pore, MD  lidocaine -prilocaine  (EMLA ) cream Apply to affected area once 05/19/24   Lonn Hicks, MD  mirtazapine  (REMERON ) 15 MG tablet TAKE 1 TABLET(15 MG) BY MOUTH AT BEDTIME 03/27/24   Lonn Hicks, MD  ondansetron  (ZOFRAN ) 8 MG tablet Take 1 tablet (8 mg  total) by mouth every 8 (eight) hours as needed for nausea or vomiting. 05/19/24   Lonn Hicks, MD  predniSONE  (DELTASONE ) 20 MG tablet Take 2 tablets (40 mg total) by mouth daily with breakfast. 05/19/24   Lonn Hicks, MD  prochlorperazine  (COMPAZINE ) 10 MG tablet Take 1 tablet (10 mg total) by mouth every 6 (six) hours as needed for nausea or vomiting.  05/19/24   Lonn Hicks, MD  traMADol  (ULTRAM ) 50 MG tablet Take 2 tablets (100 mg total) by mouth every 6 (six) hours as needed for severe pain (pain score 7-10). 05/09/24 05/20/24  Lonn Hicks, MD  TYLENOL  500 MG tablet Take 500-1,000 mg by mouth every 6 (six) hours as needed for mild pain (pain score 1-3) (or headaches).    [provider]     Family History  Problem Relation Age of Onset   Cancer Maternal Aunt        lung   Cancer Maternal Grandmother        unknown ca    Social History   Socioeconomic History   Marital status: Single    Spouse name: Not on file   Number of children: Not on file   Years of education: Not on file   Highest education level: Not on file  Occupational History   Not on file  Tobacco Use   Smoking status: Heavy Smoker    Current packs/day: 1.00    Average packs/day: 1 pack/day for 10.2 years (10.2 ttl pk-yrs)    Types: Cigars, Cigarettes    Start date: 03/19/2014   Smokeless tobacco: Never   Tobacco comments:    she smokes 3 Black and Mild Cigars daily  Vaping Use   Vaping status: Never Used  Substance and Sexual Activity   Alcohol use: Yes    Comment: rarely   Drug use: Yes    Types: Marijuana    Comment: 2 blunts per day   Sexual activity: Yes    Partners: Male    Birth control/protection: None    Comment: 1ST intercourse- 17, partners- 7, declined condoms  Other Topics Concern   Not on file  Social History Narrative   Not on file   Social Drivers of Health   Tobacco Use: High Risk (05/07/2024)   Patient History    Smoking Tobacco Use: Heavy Smoker    Smokeless Tobacco Use: Never    Passive Exposure: Not on file  Financial Resource Strain: Not on file  Food Insecurity: No Food Insecurity (05/09/2024)   Epic    Worried About Programme Researcher, Broadcasting/film/video in the Last Year: Never true    Ran Out of Food in the Last Year: Never true  Transportation Needs: No Transportation Needs (05/09/2024)   Epic    Lack of Transportation  (Medical): No    Lack of Transportation (Non-Medical): No  Physical Activity: Not on file  Stress: Not on file  Social Connections: Not on file  Depression (EYV7-0): Medium Risk (12/15/2023)   Depression (PHQ2-9)    PHQ-2 Score: 8  Alcohol Screen: Not on file  Housing: Low Risk (05/09/2024)   Epic    Unable to Pay for Housing in the Last Year: No    Number of Times Moved in the Last Year: 0    Homeless in the Last Year: No  Utilities: Not At Risk (05/09/2024)   Epic    Threatened with loss of utilities: No  Health Literacy: Not on file      Review of Systems  Vital Signs: LMP 05/09/2024 (Exact Date)   Advance Care Plan: no documents on file  Physical Exam  Imaging: NM PET Image Restage (PS) Skull Base to Thigh (F-18 FDG) Result Date: 05/19/2024 CLINICAL DATA:  Subsequent treatment strategy for lymphoma. EXAM: NUCLEAR MEDICINE PET SKULL BASE TO THIGH TECHNIQUE: 5.4 mCi F-18 FDG was injected intravenously. Full-ring PET imaging was performed from the skull base to thigh after the radiotracer. CT data was obtained and used for attenuation correction and anatomic localization. Fasting blood glucose: 84 mg/dl COMPARISON:  Chest abdomen pelvis CT 05/07/2024.  PET-CT 06/09/2017. FINDINGS: Mediastinal blood pool activity: SUV max 1.6 1.8 Liver activity: SUV max 2.1 NECK: 10 mm short axis left supraclavicular lymph node is hypermetabolic with SUV max = 6.4. Incidental CT findings: None. CHEST: No hypermetabolic mediastinal, hilar, or axillary lymphadenopathy. Retrocrural lymphadenopathy measures up to 1.4 cm short axis and is hypermetabolic with SUV max = 8.0-8.9. Incidental CT findings: No pericardial effusion. Trace pleural fluid noted bilaterally. Centrilobular and paraseptal emphysema evident. ABDOMEN/PELVIS: Bulky hypermetabolic abdominal lymphadenopathy evident including mesenteric, retroperitoneal, and pelvic sidewall lymphadenopathy. Index left mesenteric node measuring 2.7 cm short axis  on 113/4 demonstrates SUV max = 8.7. index right common iliac node measuring 2.4 cm short axis on 141/4 shows SUV max = 11.8. Incidental CT findings: Mild atherosclerotic calcification noted in the abdominal aorta. Layering sludge evident in the gallbladder. No substantial intraperitoneal free fluid. SKELETON: No focal hypermetabolic activity to suggest skeletal metastasis. Incidental CT findings: No worrisome lytic or sclerotic osseous abnormality. IMPRESSION: 1. Hypermetabolic lymphadenopathy in the neck, chest, abdomen, and pelvis consistent with recurrent lymphoma. [place] 5. 2. Trace bilateral pleural effusions. 3. Aortic Atherosclerosis (ICD10-I70.0) and Emphysema (ICD10-J43.9). Electronically Signed   By: Camellia Candle M.D.   On: 05/19/2024 08:36   CT ABDOMINAL MASS BIOPSY Result Date: 05/09/2024 INDICATION: 39 year old female with history of Hodgkin's lymphoma and retroperitoneal lymphadenopathy. Request for bone marrow biopsy and retroperitoneal lymph node biopsy. EXAM: 1. CT BIOPSY 2. CT-guided retroperitoneal lymph node biopsy COMPARISON:  05/07/2024 MEDICATIONS: 25 mg Benadryl , intravenous ANESTHESIA/SEDATION: Fentanyl  100 mcg IV; Versed  4 mg IV Sedation time: 17 minutes; The patient was continuously monitored during the procedure by the interventional radiology nurse under my direct supervision. CONTRAST:  None. COMPLICATIONS: None immediate. PROCEDURE: RADIATION DOSE REDUCTION: This exam was performed according to the departmental dose-optimization program which includes automated exposure control, adjustment of the mA and/or kV according to patient size and/or use of iterative reconstruction technique. Informed consent was obtained from the patient following an explanation of the procedure, risks, benefits and alternatives. A time out was performed prior to the initiation of the procedure. The patient was positioned prone on the CT gantry. The left posterior sacroiliac and right lower lumbar  region was prepped and draped in standard fashion. Procedure was planned. Subdermal Local anesthesia was provided at the planned needle entry site with 1% lidocaine . Deeper local anesthetic was provided to the periosteum of the left iliac bone. An 11 gauge introducer needle was advanced to the iliac bone and position was confirmed with CT imaging. Next, the coaxial needle was advanced into the marrow space and aspirate was performed followed by core biopsy. Hemostasis was achieved and a sterile bandage was applied. The lymph node procedure was then planned. Appropriate trajectory was confirmed with a 22 gauge spinal needle after the adjacent tissues were anesthetized with 1% Lidocaine  with epinephrine . Under intermittent CT guidance, a 17 gauge coaxial needle was advanced into the peripheral aspect of the targeted  right burr to peritoneal lymph node. Appropriate positioning was confirmed and total of 2 samples were obtained with an 18 gauge core needle biopsy device. The co-axial needle was removed and hemostasis was achieved with manual compression. A limited postprocedural CT was negative for hemorrhage or additional complication. A dressing was placed. The patient tolerated the procedure well without immediate postprocedural complication. IMPRESSION: 1. Technically successful CT-guided bone marrow biopsy of the left iliac bone. 2. Technically successful CT guided core needle biopsy of right retroperitoneal lymph node. Ester Sides, MD [facility] Radiology Electronically Signed   By: Ester Sides M.D.   On: 05/09/2024 15:21   CT BONE MARROW BIOPSY & ASPIRATION Result Date: 05/09/2024 INDICATION: 39 year old female with history of Hodgkin's lymphoma and retroperitoneal lymphadenopathy. Request for bone marrow biopsy and retroperitoneal lymph node biopsy. EXAM: 1. CT BONE MARROW BIOPSY AND ASPIRATION 2. CT-guided retroperitoneal lymph node biopsy COMPARISON:   05/07/2024 MEDICATIONS: 25 mg Benadryl , intravenous ANESTHESIA/SEDATION: Fentanyl  100 mcg IV; Versed  4 mg IV Sedation time: 17 minutes; The patient was continuously monitored during the procedure by the interventional radiology nurse under my direct supervision. CONTRAST:  None. COMPLICATIONS: None immediate. PROCEDURE: RADIATION DOSE REDUCTION: This exam was performed according to the departmental dose-optimization program which includes automated exposure control, adjustment of the mA and/or kV according to patient size and/or use of iterative reconstruction technique. Informed consent was obtained from the patient following an explanation of the procedure, risks, benefits and alternatives. A time out was performed prior to the initiation of the procedure. The patient was positioned prone on the CT gantry. The left posterior sacroiliac and right lower lumbar region was prepped and draped in standard fashion. Procedure was planned. Subdermal Local anesthesia was provided at the planned needle entry site with 1% lidocaine . Deeper local anesthetic was provided to the periosteum of the left iliac bone. An 11 gauge introducer needle was advanced to the iliac bone and position was confirmed with CT imaging. Next, the coaxial needle was advanced into the marrow space and aspirate was performed followed by core biopsy. Hemostasis was achieved and a sterile bandage was applied. The lymph node procedure was then planned. Appropriate trajectory was confirmed with a 22 gauge spinal needle after the adjacent tissues were anesthetized with 1% Lidocaine  with epinephrine . Under intermittent CT guidance, a 17 gauge coaxial needle was advanced into the peripheral aspect of the targeted right burr to peritoneal lymph node. Appropriate positioning was confirmed and total of 2 samples were obtained with an 18 gauge core needle biopsy device. The co-axial needle was removed and hemostasis was achieved with manual compression. A limited  postprocedural CT was negative for hemorrhage or additional complication. A dressing was placed. The patient tolerated the procedure well without immediate postprocedural complication. IMPRESSION: 1. Technically successful CT-guided bone marrow biopsy of the left iliac bone. 2. Technically successful CT guided core needle biopsy of right retroperitoneal lymph node. Ester Sides, MD [facility] Radiology Electronically Signed   By: Ester Sides M.D.   On: 05/09/2024 15:16   CT HEAD WO CONTRAST ( ) Result Date: 05/07/2024 EXAM: CT HEAD WITHOUT CONTRAST 05/07/2024 10:08:21 PM TECHNIQUE: CT of the head was performed without the administration of intravenous contrast. Automated exposure control, iterative reconstruction, and/or weight based adjustment of the mA/kV was utilized to reduce the radiation dose to as low as reasonably achievable. COMPARISON: CT head 3:14:6 CLINICAL HISTORY: Headache, sudden, severe. FINDINGS: BRAIN AND VENTRICLES: No acute hemorrhage. No evidence  of acute infarct. No hydrocephalus. No extra-axial collection. No mass effect or midline shift. ORBITS: No acute abnormality. SINUSES: Mucosal thickening of the bilateral sphenoid and ethmoid sinuses. SOFT TISSUES AND SKULL: No acute soft tissue abnormality. No skull fracture. Mastoid air cells are clear. IMPRESSION: 1. No acute intracranial abnormality. 2. Mucosal thickening of the bilateral sphenoid and ethmoid sinuses. Electronically signed by: Morgane Naveau MD MD 05/07/2024 10:12 PM EST RP Workstation: [id number]   CT CHEST ABDOMEN PELVIS W CONTRAST Result Date: 05/07/2024 EXAM: CT CHEST, ABDOMEN AND PELVIS WITH CONTRAST 05/07/2024 07:17:33 PM TECHNIQUE: CT of the chest, abdomen and pelvis was performed with the administration of 100 mL of iohexol  (OMNIPAQUE ) 300 MG/ML solution. Multiplanar reformatted images are provided for review. Automated exposure control, iterative reconstruction,  and/or weight based adjustment of the mA/kV was utilized to reduce the radiation dose to as low as reasonably achievable. COMPARISON: 12/05/2019 CLINICAL HISTORY: Sepsis. Additional history of Hodgkin lymphoma on the prior. FINDINGS: CHEST: MEDIASTINUM AND LYMPH NODES: Heart and pericardium are unremarkable. The central airways are clear. No mediastinal, hilar or axillary lymphadenopathy. LUNGS AND PLEURA: Mild paraseptal emphysematous changes at the lung apices. No focal consolidation or pulmonary edema. No pleural effusion. No pneumothorax. ABDOMEN AND PELVIS: LIVER: Unremarkable. GALLBLADDER AND BILE DUCTS: Unremarkable. No biliary ductal dilatation. SPLEEN: Spleen is normal in size. PANCREAS: No acute abnormality. ADRENAL GLANDS: No acute abnormality. KIDNEYS, URETERS AND BLADDER: No stones in the kidneys or ureters. No hydronephrosis. No perinephric or periureteral stranding. Urinary bladder is unremarkable. GI AND BOWEL: Stomach demonstrates no acute abnormality. There is no bowel obstruction. REPRODUCTIVE ORGANS: Uterus is within normal limits. PERITONEUM AND RETROPERITONEUM: No ascites. No free air. VASCULATURE: Aorta is normal in caliber. ABDOMINAL AND PELVIS LYMPH NODES: Abdominopelvic lymphadenopathy, including: 1.4 cm right retrocrural node (image 57), 3.6 cm mesenteric node in the right upper abdomen (image 82), 2.2 cm short axis right paraaortic node (image 80), 2.2 cm short axis left common iliac node (image 89), 2.8 cm right internal iliac node (image 98). BONES AND SOFT TISSUES: No acute osseous abnormality. No focal soft tissue abnormality. IMPRESSION: 1. Abdominopelvic lymphadenopathy, as above, suggesting recurrent lymphoma. 2. Spleen is normal in size. Electronically signed by: Pinkie Pebbles MD MD 05/07/2024 07:34 PM EST RP Workstation: [id number]   DG Chest 2 View Result Date: 05/07/2024 CLINICAL DATA:  Productive cough, fever, headache EXAM: CHEST - 2 VIEW COMPARISON:  03/22/2024  FINDINGS: Frontal and lateral views of the chest demonstrate an unremarkable cardiac silhouette. No airspace disease, effusion, or pneumothorax. No acute bony abnormalities. IMPRESSION: 1. No acute intrathoracic process. Electronically Signed   By: Ozell Daring M.D.   On: 05/07/2024 16:07    Labs:  CBC: Recent Labs    05/07/24 1823 05/08/24 0510 05/09/24 0047 05/19/24 1336  WBC 10.3 9.3 8.9 8.9  HGB 6.7* 6.2* 9.3* 9.3*  HCT 18.4* 19.0* 26.8* 27.1*  PLT 564* 545* 503* 559*    COAGS: Recent Labs    05/09/24 0047  INR 1.3*    BMP: Recent Labs    03/23/24 0941 05/02/24 1111 05/07/24 1823 05/08/24 0510 05/19/24 1336  NA 137 135 137 135 136  K 4.1 4.1 3.7 3.6 4.1  CL 106 101 102 102 98  CO2 22 25 24 24 27   GLUCOSE 145* 84 81 115* 99  BUN <5* 10 6 9 12   CALCIUM  9.2 8.7 9.1 8.9 9.5  CREATININE 0.87 0.80 0.84 0.94 0.76  GFRNONAA >60  --  >60 >60 >60  LIVER FUNCTION TESTS: Recent Labs    03/23/24 0941 05/02/24 1111 05/07/24 1823 05/08/24 0510 05/19/24 1336  BILITOT 0.4 0.6 0.5 0.5 0.6  AST 18 20 25 23  36  ALT 8 28 22 21  43  ALKPHOS 132*  --  166* 161* 154*  PROT 7.0 7.7 8.5* 7.8 9.2*  ALBUMIN 2.8*  --  3.0* 2.7* 3.3*    TUMOR MARKERS: No results for input(s): AFPTM, CEA, CA199, CHROMGRNA in the last 8760 hours.  Assessment and Plan: 39 y.o. female known to IR team from right cervical lymph node biopsy in 2016, Port-A-Cath placement 2016, bone marrow biopsy and right iliac bone lesion biopsy in 2017, Port-A-Cath removal 2017, retroperitoneal lymph node biopsy 2018, new Port-A-Cath placement 2018 with subsequent removal in 2021.  She is a 39 year old female smoker with past medical history significant for depression, chronic bronchitis, esophagitis, HIV, HSV, and nodular sclerosing Hodgkin's lymphoma initially diagnosed 2016 with subsequent chemotherapy.  Patient had relapse in 2018 and completed chemotherapy with no signs of cancer since 2019.  She was  recently admitted with severe anemia, recurrent fever, chills and CT scan showing signs of abdominal lymphadenopathy worrisome for recurrent lymphoma.  She was transfused. She underwent BM bx as well as RP LN bx on 05/09/24 . LN bx revealed classic Hodgkin's lymphoma. She is scheduled today for port a cath placement to assist with treatment. Risks and benefits of image guided port-a-catheter placement was discussed with the patient including, but not limited to bleeding, infection, pneumothorax, or fibrin sheath development and need for additional procedures.  All of the patient's questions were answered, patient is agreeable to proceed. Consent signed and in chart.  All of the questions were answered and there is agreement to proceed.  Consent signed and in chart.    Thank you for allowing our service to participate in [name] 's care.  Electronically Signed: D. Franky Rakers, PA-C   05/22/2024, 6:37 PM      I spent a total of    20 minutes in face to face in clinical consultation, greater than 50% of which was counseling/coordinating care for port a cath placement  "

## 2024-05-23 ENCOUNTER — Ambulatory Visit (HOSPITAL_COMMUNITY)
Admission: RE | Admit: 2024-05-23 | Discharge: 2024-05-23 | Disposition: A | Payer: Medicare (Managed Care) | Source: Ambulatory Visit | Attending: Hematology and Oncology

## 2024-05-23 ENCOUNTER — Encounter (HOSPITAL_COMMUNITY): Payer: Self-pay

## 2024-05-23 ENCOUNTER — Telehealth: Payer: Self-pay

## 2024-05-23 DIAGNOSIS — C8118 Nodular sclerosis classical Hodgkin lymphoma, lymph nodes of multiple sites: Secondary | ICD-10-CM | POA: Diagnosis present

## 2024-05-23 DIAGNOSIS — F17210 Nicotine dependence, cigarettes, uncomplicated: Secondary | ICD-10-CM | POA: Diagnosis not present

## 2024-05-23 DIAGNOSIS — F17290 Nicotine dependence, other tobacco product, uncomplicated: Secondary | ICD-10-CM | POA: Diagnosis not present

## 2024-05-23 MED ORDER — MIDAZOLAM HCL (PF) 2 MG/2ML IJ SOLN
INTRAMUSCULAR | Status: AC | PRN
  Administered 2024-05-23: 1 mg via INTRAVENOUS

## 2024-05-23 MED ORDER — MIDAZOLAM HCL 2 MG/2ML IJ SOLN
INTRAMUSCULAR | Status: AC
  Filled 2024-05-23: qty 2

## 2024-05-23 MED ORDER — HEPARIN SOD (PORK) LOCK FLUSH 100 UNIT/ML IV SOLN
INTRAVENOUS | Status: AC
  Filled 2024-05-23: qty 5

## 2024-05-23 MED ORDER — FENTANYL CITRATE (PF) 100 MCG/2ML IJ SOLN
INTRAMUSCULAR | Status: AC
  Filled 2024-05-23: qty 2

## 2024-05-23 MED ORDER — FENTANYL CITRATE (PF) 100 MCG/2ML IJ SOLN
INTRAMUSCULAR | Status: AC | PRN
  Administered 2024-05-23: 50 ug via INTRAVENOUS

## 2024-05-23 MED ORDER — CEFAZOLIN SODIUM-DEXTROSE 2-4 GM/100ML-% IV SOLN
INTRAVENOUS | Status: AC
  Filled 2024-05-23: qty 100

## 2024-05-23 MED ORDER — LIDOCAINE HCL 1 % IJ SOLN
20.0000 mL | Freq: Once | INTRAMUSCULAR | Status: AC
  Administered 2024-05-23: 20 mL via INTRADERMAL

## 2024-05-23 MED ORDER — HEPARIN SOD (PORK) LOCK FLUSH 100 UNIT/ML IV SOLN
500.0000 [IU] | Freq: Once | INTRAVENOUS | Status: AC
  Administered 2024-05-23: 500 [IU] via INTRAVENOUS

## 2024-05-23 MED ORDER — LIDOCAINE HCL 1 % IJ SOLN
INTRAMUSCULAR | Status: AC
  Filled 2024-05-23: qty 20

## 2024-05-23 MED ORDER — SODIUM CHLORIDE 0.9 % IV SOLN: Freq: Once | INTRAVENOUS | Status: AC

## 2024-05-23 MED ORDER — CEFAZOLIN SODIUM-DEXTROSE 2-4 GM/100ML-% IV SOLN
INTRAVENOUS | Status: AC | PRN
  Administered 2024-05-23: 2 g via INTRAVENOUS

## 2024-05-23 MED ORDER — HYDROCODONE-ACETAMINOPHEN 5-325 MG PO TABS
1.0000 | ORAL_TABLET | Freq: Once | ORAL | Status: AC
  Administered 2024-05-23: 1 via ORAL
  Filled 2024-05-23: qty 1

## 2024-05-23 NOTE — Telephone Encounter (Signed)
 Returned her call regarding Prednisone . Told her per Dr. Lonn, she will stop Prednisone  tomorrow. She verbalized understanding.

## 2024-05-23 NOTE — Sedation Documentation (Signed)
 RN Lavoris Canizales pulled 4 mg Versed  and 100 mcg Fentanyl  in IR room. Pt. Received 4 mg Versed  and 100 mcg Fentanyl  throughout the procedure.

## 2024-05-23 NOTE — Procedures (Signed)
 Interventional Radiology Procedure Note  Procedure: Single Lumen Power Port Placement    Access:  Right IJ vein.  Findings: Catheter tip positioned at SVC/RA junction. Port is ready for immediate use.   Complications: None  EBL: < 10 mL  Recommendations:  - Ok to shower in 24 hours - Do not submerge for 7 days - Routine line care   Maxi Carreras T. Fredia Sorrow, M.D Pager:  [phone]

## 2024-05-23 NOTE — Telephone Encounter (Signed)
 Faxed referral to [facility] at [phone], received a fax confirmation.

## 2024-05-23 NOTE — Telephone Encounter (Signed)
-----   Message from Almarie Bedford, MD sent at 05/19/2024  3:17 PM EST ----- Porsche,  Please send referral to bone marrow transplant team at [facility]

## 2024-05-23 NOTE — Discharge Instructions (Signed)
 MAY CONTACT INTERVENTIONAL RADIOLOGY CLINIC AT [phone] WITH ANY QUESTIONS OR CONCERNS  MAY REMOVE DRESSING AND SHOWER TOMORROW

## 2024-05-25 ENCOUNTER — Other Ambulatory Visit: Payer: Self-pay | Admitting: Hematology and Oncology

## 2024-05-25 ENCOUNTER — Inpatient Hospital Stay

## 2024-05-25 ENCOUNTER — Ambulatory Visit: Payer: Medicare (Managed Care) | Admitting: Obstetrics and Gynecology

## 2024-05-25 VITALS — BP 107/69 | HR 60 | Temp 98.0°F | Resp 16

## 2024-05-25 DIAGNOSIS — Z5112 Encounter for antineoplastic immunotherapy: Secondary | ICD-10-CM | POA: Diagnosis not present

## 2024-05-25 DIAGNOSIS — C8118 Nodular sclerosis classical Hodgkin lymphoma, lymph nodes of multiple sites: Secondary | ICD-10-CM

## 2024-05-25 LAB — PREGNANCY, URINE: Preg Test, Ur: NEGATIVE

## 2024-05-25 MED ORDER — SODIUM CHLORIDE 0.9 % IV SOLN: INTRAVENOUS | Status: DC

## 2024-05-25 MED ORDER — SODIUM CHLORIDE 0.9 % IV SOLN
2.9100 mg/kg | Freq: Once | INTRAVENOUS | Status: AC
  Administered 2024-05-25: 140 mg via INTRAVENOUS
  Filled 2024-05-25: qty 4

## 2024-05-25 MED ORDER — DIPHENHYDRAMINE HCL 50 MG/ML IJ SOLN
12.5000 mg | Freq: Once | INTRAMUSCULAR | Status: AC
  Administered 2024-05-25: 12.5 mg via INTRAVENOUS
  Filled 2024-05-25: qty 1

## 2024-05-25 MED ORDER — ACETAMINOPHEN 325 MG PO TABS
650.0000 mg | ORAL_TABLET | Freq: Once | ORAL | Status: AC
  Administered 2024-05-25: 650 mg via ORAL
  Filled 2024-05-25: qty 2

## 2024-05-25 MED ORDER — TRAMADOL HCL 50 MG PO TABS: 100.0000 mg | ORAL_TABLET | Freq: Four times a day (QID) | ORAL | 0 refills | Status: AC | PRN

## 2024-05-25 MED ORDER — SODIUM CHLORIDE 0.9 % IV SOLN
1.8000 mg/kg | Freq: Once | INTRAVENOUS | Status: AC
  Administered 2024-05-25: 85 mg via INTRAVENOUS
  Filled 2024-05-25: qty 17

## 2024-05-25 MED ORDER — DEXAMETHASONE SOD PHOSPHATE PF 10 MG/ML IJ SOLN
10.0000 mg | Freq: Once | INTRAMUSCULAR | Status: AC
  Administered 2024-05-25: 10 mg via INTRAVENOUS
  Filled 2024-05-25: qty 1

## 2024-05-25 NOTE — Patient Instructions (Signed)
 [facility] - A DEPT OF [facility]  Discharge Instructions: Thank you for choosing [facility] to provide your oncology and hematology care.   If you have a lab appointment with the Cancer Center, please go directly to the Cancer Center and check in at the registration area.   Wear comfortable clothing and clothing appropriate for easy access to any Portacath or PICC line.   We strive to give you quality time with your provider. You may need to reschedule your appointment if you arrive late (15 or more minutes).  Arriving late affects you and other patients whose appointments are after yours.  Also, if you miss three or more appointments without notifying the office, you may be dismissed from the clinic at the providers discretion.      For prescription refill requests, have your pharmacy contact our office and allow 72 hours for refills to be completed.    Today you received the following chemotherapy and/or immunotherapy agents: brentuximab vedotin  and nivolumab       To help prevent nausea and vomiting after your treatment, we encourage you to take your nausea medication as directed.  BELOW ARE SYMPTOMS THAT SHOULD BE REPORTED IMMEDIATELY: *FEVER GREATER THAN 100.4 F (38 C) OR HIGHER *CHILLS OR SWEATING *NAUSEA AND VOMITING THAT IS NOT CONTROLLED WITH YOUR NAUSEA MEDICATION *UNUSUAL SHORTNESS OF BREATH *UNUSUAL BRUISING OR BLEEDING *URINARY PROBLEMS (pain or burning when urinating, or frequent urination) *BOWEL PROBLEMS (unusual diarrhea, constipation, pain near the anus) TENDERNESS IN MOUTH AND THROAT WITH OR WITHOUT PRESENCE OF ULCERS (sore throat, sores in mouth, or a toothache) UNUSUAL RASH, SWELLING OR PAIN  UNUSUAL VAGINAL DISCHARGE OR ITCHING   Items with * indicate a potential emergency and should be followed up as soon as possible or go to the Emergency Department if any problems should occur.  Please show the CHEMOTHERAPY  ALERT CARD or IMMUNOTHERAPY ALERT CARD at check-in to the Emergency Department and triage nurse.  Should you have questions after your visit or need to cancel or reschedule your appointment, please contact [facility] - A DEPT OF [facility]  Dept: [phone]  and follow the prompts.  Office hours are 8:00 a.m. to 4:30 p.m. Monday - Friday. Please note that voicemails left after 4:00 p.m. may not be returned until the following business day.  We are closed weekends and major holidays. You have access to a nurse at all times for urgent questions. Please call the main number to the clinic Dept: [phone] and follow the prompts.   For any non-urgent questions, you may also contact your provider using MyChart. We now offer e-Visits for anyone 90 and older to request care online for non-urgent symptoms. For details visit [email or web address].   Also download the MyChart app! Go to the app store, search MyChart, open the app, select [place], and log in with your MyChart username and password.

## 2024-06-02 ENCOUNTER — Telehealth: Payer: Self-pay

## 2024-06-02 ENCOUNTER — Encounter: Payer: Self-pay | Admitting: Hematology and Oncology

## 2024-06-02 NOTE — Telephone Encounter (Signed)
 She is not due for refill until Monday, I will refill on [place]

## 2024-06-02 NOTE — Telephone Encounter (Signed)
 Spoke with the patient refill is not due until Monday. She is okay with this.

## 2024-06-02 NOTE — Telephone Encounter (Signed)
 She called and requested refill of Tramadol  to [facility].

## 2024-06-05 ENCOUNTER — Ambulatory Visit: Admitting: Infectious Disease

## 2024-06-15 ENCOUNTER — Inpatient Hospital Stay

## 2024-06-15 ENCOUNTER — Inpatient Hospital Stay: Admitting: Hematology and Oncology

## 2024-06-16 ENCOUNTER — Inpatient Hospital Stay: Admitting: Hematology and Oncology

## 2024-06-16 ENCOUNTER — Inpatient Hospital Stay

## 2024-06-16 ENCOUNTER — Inpatient Hospital Stay: Attending: Hematology and Oncology

## 2024-06-20 ENCOUNTER — Ambulatory Visit: Payer: Self-pay | Admitting: Family

## 2024-07-10 ENCOUNTER — Inpatient Hospital Stay

## 2024-07-10 ENCOUNTER — Inpatient Hospital Stay: Admitting: Hematology and Oncology
